# Patient Record
Sex: Female | Born: 1950
Health system: Southern US, Community
[De-identification: ages and names within clinical notes are randomized; demographics above are authoritative.]

## PROBLEM LIST (undated history)

## (undated) DIAGNOSIS — Z87442 Personal history of urinary calculi: Secondary | ICD-10-CM

## (undated) DIAGNOSIS — F32A Depression, unspecified: Secondary | ICD-10-CM

## (undated) DIAGNOSIS — R112 Nausea with vomiting, unspecified: Secondary | ICD-10-CM

## (undated) DIAGNOSIS — H9312 Tinnitus, left ear: Secondary | ICD-10-CM

## (undated) DIAGNOSIS — I10 Essential (primary) hypertension: Secondary | ICD-10-CM

## (undated) DIAGNOSIS — F419 Anxiety disorder, unspecified: Secondary | ICD-10-CM

## (undated) DIAGNOSIS — R011 Cardiac murmur, unspecified: Secondary | ICD-10-CM

## (undated) DIAGNOSIS — I509 Heart failure, unspecified: Secondary | ICD-10-CM

## (undated) DIAGNOSIS — E278 Other specified disorders of adrenal gland: Secondary | ICD-10-CM

## (undated) DIAGNOSIS — E119 Type 2 diabetes mellitus without complications: Secondary | ICD-10-CM

## (undated) DIAGNOSIS — K219 Gastro-esophageal reflux disease without esophagitis: Secondary | ICD-10-CM

## (undated) DIAGNOSIS — G709 Myoneural disorder, unspecified: Secondary | ICD-10-CM

## (undated) DIAGNOSIS — M1990 Unspecified osteoarthritis, unspecified site: Secondary | ICD-10-CM

## (undated) DIAGNOSIS — E785 Hyperlipidemia, unspecified: Secondary | ICD-10-CM

## (undated) DIAGNOSIS — J189 Pneumonia, unspecified organism: Secondary | ICD-10-CM

## (undated) DIAGNOSIS — R002 Palpitations: Secondary | ICD-10-CM

## (undated) DIAGNOSIS — F4310 Post-traumatic stress disorder, unspecified: Secondary | ICD-10-CM

## (undated) DIAGNOSIS — Z9889 Other specified postprocedural states: Secondary | ICD-10-CM

## (undated) HISTORY — PX: TONSILLECTOMY: SUR1361

## (undated) HISTORY — PX: NOSE SURGERY: SHX723

## (undated) HISTORY — PX: FRACTURE SURGERY: SHX138

## (undated) HISTORY — PX: RECTOCELE REPAIR: SHX761

## (undated) HISTORY — PX: BACK SURGERY: SHX140

## (undated) HISTORY — PX: VEIN SURGERY: SHX48

## (undated) HISTORY — PX: BLADDER SUSPENSION: SHX72

## (undated) HISTORY — PX: OTHER SURGICAL HISTORY: SHX169

## (undated) HISTORY — PX: ACHILLES TENDON SURGERY: SHX542

## (undated) HISTORY — PX: CHOLECYSTECTOMY: SHX55

## (undated) HISTORY — PX: ABDOMINAL HYSTERECTOMY: SHX81

## (undated) HISTORY — PX: APPENDECTOMY: SHX54

## (undated) HISTORY — PX: PARTIAL HYSTERECTOMY: SHX80

## (undated) HISTORY — DX: Heart failure, unspecified: I50.9

## (undated) HISTORY — DX: Unspecified osteoarthritis, unspecified site: M19.90

## (undated) HISTORY — PX: CARDIAC CATHETERIZATION: SHX172

## (undated) HISTORY — PX: VASCULAR SURGERY: SHX849

---

## 1999-07-24 ENCOUNTER — Other Ambulatory Visit: Admission: RE | Admit: 1999-07-24 | Discharge: 1999-07-24 | Payer: Self-pay | Admitting: Obstetrics and Gynecology

## 2000-09-18 ENCOUNTER — Observation Stay (HOSPITAL_COMMUNITY): Admission: EM | Admit: 2000-09-18 | Discharge: 2000-09-19 | Payer: Self-pay | Admitting: Cardiology

## 2000-09-18 ENCOUNTER — Encounter: Payer: Self-pay | Admitting: Cardiology

## 2001-04-07 HISTORY — PX: BREAST SURGERY: SHX581

## 2001-04-14 ENCOUNTER — Other Ambulatory Visit: Admission: RE | Admit: 2001-04-14 | Discharge: 2001-04-14 | Payer: Self-pay | Admitting: Obstetrics and Gynecology

## 2001-06-21 ENCOUNTER — Inpatient Hospital Stay (HOSPITAL_COMMUNITY): Admission: RE | Admit: 2001-06-21 | Discharge: 2001-06-24 | Payer: Self-pay | Admitting: Urology

## 2001-07-04 ENCOUNTER — Emergency Department (HOSPITAL_COMMUNITY): Admission: EM | Admit: 2001-07-04 | Discharge: 2001-07-04 | Payer: Self-pay | Admitting: Emergency Medicine

## 2001-07-06 ENCOUNTER — Emergency Department (HOSPITAL_COMMUNITY): Admission: EM | Admit: 2001-07-06 | Discharge: 2001-07-06 | Payer: Self-pay | Admitting: *Deleted

## 2002-01-21 ENCOUNTER — Encounter: Payer: Self-pay | Admitting: Emergency Medicine

## 2002-01-22 ENCOUNTER — Observation Stay (HOSPITAL_COMMUNITY): Admission: EM | Admit: 2002-01-22 | Discharge: 2002-01-23 | Payer: Self-pay | Admitting: Emergency Medicine

## 2002-10-05 ENCOUNTER — Other Ambulatory Visit: Admission: RE | Admit: 2002-10-05 | Discharge: 2002-10-05 | Payer: Self-pay | Admitting: Obstetrics and Gynecology

## 2002-10-06 ENCOUNTER — Encounter: Admission: RE | Admit: 2002-10-06 | Discharge: 2002-10-06 | Payer: Self-pay | Admitting: Obstetrics and Gynecology

## 2002-10-06 ENCOUNTER — Encounter: Payer: Self-pay | Admitting: Obstetrics and Gynecology

## 2003-02-28 ENCOUNTER — Encounter: Admission: RE | Admit: 2003-02-28 | Discharge: 2003-02-28 | Payer: Self-pay | Admitting: Pulmonary Disease

## 2003-08-19 ENCOUNTER — Inpatient Hospital Stay (HOSPITAL_COMMUNITY): Admission: EM | Admit: 2003-08-19 | Discharge: 2003-08-26 | Payer: Self-pay | Admitting: Emergency Medicine

## 2003-09-06 ENCOUNTER — Ambulatory Visit (HOSPITAL_COMMUNITY): Admission: RE | Admit: 2003-09-06 | Discharge: 2003-09-06 | Payer: Self-pay | Admitting: Family Medicine

## 2003-11-21 ENCOUNTER — Encounter: Admission: RE | Admit: 2003-11-21 | Discharge: 2003-11-21 | Payer: Self-pay | Admitting: Family Medicine

## 2004-03-08 ENCOUNTER — Ambulatory Visit: Payer: Self-pay | Admitting: Family Medicine

## 2005-04-15 ENCOUNTER — Ambulatory Visit: Payer: Self-pay | Admitting: Family Medicine

## 2005-04-22 ENCOUNTER — Ambulatory Visit: Payer: Self-pay | Admitting: Family Medicine

## 2005-04-22 ENCOUNTER — Encounter: Admission: RE | Admit: 2005-04-22 | Discharge: 2005-04-22 | Payer: Self-pay | Admitting: Family Medicine

## 2005-04-28 ENCOUNTER — Encounter: Admission: RE | Admit: 2005-04-28 | Discharge: 2005-07-27 | Payer: Self-pay | Admitting: Family Medicine

## 2005-04-28 ENCOUNTER — Encounter: Admission: RE | Admit: 2005-04-28 | Discharge: 2005-04-28 | Payer: Self-pay | Admitting: Family Medicine

## 2005-05-13 ENCOUNTER — Ambulatory Visit (HOSPITAL_BASED_OUTPATIENT_CLINIC_OR_DEPARTMENT_OTHER): Admission: RE | Admit: 2005-05-13 | Discharge: 2005-05-13 | Payer: Self-pay | Admitting: Surgery

## 2005-05-14 ENCOUNTER — Encounter (INDEPENDENT_AMBULATORY_CARE_PROVIDER_SITE_OTHER): Payer: Self-pay | Admitting: *Deleted

## 2005-07-16 ENCOUNTER — Ambulatory Visit: Payer: Self-pay | Admitting: Family Medicine

## 2005-07-30 ENCOUNTER — Ambulatory Visit: Payer: Self-pay | Admitting: Family Medicine

## 2005-08-26 ENCOUNTER — Ambulatory Visit: Payer: Self-pay | Admitting: Family Medicine

## 2005-11-27 ENCOUNTER — Emergency Department (HOSPITAL_COMMUNITY): Admission: EM | Admit: 2005-11-27 | Discharge: 2005-11-27 | Payer: Self-pay | Admitting: Emergency Medicine

## 2005-12-11 ENCOUNTER — Emergency Department (HOSPITAL_COMMUNITY): Admission: EM | Admit: 2005-12-11 | Discharge: 2005-12-11 | Payer: Self-pay | Admitting: Family Medicine

## 2006-02-10 ENCOUNTER — Emergency Department (HOSPITAL_COMMUNITY): Admission: EM | Admit: 2006-02-10 | Discharge: 2006-02-11 | Payer: Self-pay | Admitting: Emergency Medicine

## 2006-02-16 ENCOUNTER — Ambulatory Visit: Payer: Self-pay | Admitting: Family Medicine

## 2006-04-29 ENCOUNTER — Ambulatory Visit: Payer: Self-pay | Admitting: Internal Medicine

## 2006-04-29 ENCOUNTER — Emergency Department (HOSPITAL_COMMUNITY): Admission: EM | Admit: 2006-04-29 | Discharge: 2006-04-29 | Payer: Self-pay | Admitting: Emergency Medicine

## 2006-05-02 ENCOUNTER — Emergency Department (HOSPITAL_COMMUNITY): Admission: EM | Admit: 2006-05-02 | Discharge: 2006-05-02 | Payer: Self-pay | Admitting: Family Medicine

## 2006-06-28 ENCOUNTER — Emergency Department (HOSPITAL_COMMUNITY): Admission: EM | Admit: 2006-06-28 | Discharge: 2006-06-28 | Payer: Self-pay | Admitting: Emergency Medicine

## 2006-12-06 ENCOUNTER — Emergency Department (HOSPITAL_COMMUNITY): Admission: EM | Admit: 2006-12-06 | Discharge: 2006-12-06 | Payer: Self-pay | Admitting: Emergency Medicine

## 2007-02-20 ENCOUNTER — Emergency Department (HOSPITAL_COMMUNITY): Admission: EM | Admit: 2007-02-20 | Discharge: 2007-02-20 | Payer: Self-pay | Admitting: Emergency Medicine

## 2007-03-06 ENCOUNTER — Emergency Department (HOSPITAL_COMMUNITY): Admission: EM | Admit: 2007-03-06 | Discharge: 2007-03-06 | Payer: Self-pay | Admitting: Emergency Medicine

## 2007-03-10 ENCOUNTER — Telehealth: Payer: Self-pay | Admitting: Family Medicine

## 2007-05-06 ENCOUNTER — Emergency Department (HOSPITAL_COMMUNITY): Admission: EM | Admit: 2007-05-06 | Discharge: 2007-05-06 | Payer: Self-pay | Admitting: Emergency Medicine

## 2007-07-03 ENCOUNTER — Emergency Department (HOSPITAL_COMMUNITY): Admission: EM | Admit: 2007-07-03 | Discharge: 2007-07-03 | Payer: Self-pay | Admitting: Family Medicine

## 2007-08-18 ENCOUNTER — Emergency Department (HOSPITAL_COMMUNITY): Admission: EM | Admit: 2007-08-18 | Discharge: 2007-08-18 | Payer: Self-pay | Admitting: Emergency Medicine

## 2007-09-27 ENCOUNTER — Inpatient Hospital Stay (HOSPITAL_COMMUNITY): Admission: AD | Admit: 2007-09-27 | Discharge: 2007-09-27 | Payer: Self-pay | Admitting: Obstetrics & Gynecology

## 2007-10-11 ENCOUNTER — Emergency Department (HOSPITAL_COMMUNITY): Admission: EM | Admit: 2007-10-11 | Discharge: 2007-10-11 | Payer: Self-pay | Admitting: Emergency Medicine

## 2007-11-11 ENCOUNTER — Ambulatory Visit: Payer: Self-pay | Admitting: Obstetrics and Gynecology

## 2007-11-11 ENCOUNTER — Encounter: Payer: Self-pay | Admitting: Physician Assistant

## 2007-11-11 ENCOUNTER — Ambulatory Visit (HOSPITAL_COMMUNITY): Admission: RE | Admit: 2007-11-11 | Discharge: 2007-11-11 | Payer: Self-pay | Admitting: Gynecology

## 2007-11-24 ENCOUNTER — Ambulatory Visit: Payer: Self-pay | Admitting: Obstetrics and Gynecology

## 2007-11-30 ENCOUNTER — Inpatient Hospital Stay (HOSPITAL_COMMUNITY): Admission: AD | Admit: 2007-11-30 | Discharge: 2007-11-30 | Payer: Self-pay | Admitting: Gynecology

## 2007-12-09 ENCOUNTER — Ambulatory Visit: Payer: Self-pay | Admitting: Gynecology

## 2007-12-14 ENCOUNTER — Emergency Department (HOSPITAL_COMMUNITY): Admission: EM | Admit: 2007-12-14 | Discharge: 2007-12-14 | Payer: Self-pay | Admitting: Emergency Medicine

## 2007-12-30 ENCOUNTER — Ambulatory Visit: Payer: Self-pay | Admitting: Obstetrics & Gynecology

## 2008-01-13 ENCOUNTER — Ambulatory Visit: Payer: Self-pay | Admitting: Obstetrics & Gynecology

## 2008-01-17 ENCOUNTER — Ambulatory Visit (HOSPITAL_COMMUNITY): Admission: RE | Admit: 2008-01-17 | Discharge: 2008-01-17 | Payer: Self-pay | Admitting: Obstetrics & Gynecology

## 2008-01-20 ENCOUNTER — Ambulatory Visit: Payer: Self-pay | Admitting: Obstetrics & Gynecology

## 2008-01-20 ENCOUNTER — Inpatient Hospital Stay (HOSPITAL_COMMUNITY): Admission: AD | Admit: 2008-01-20 | Discharge: 2008-01-20 | Payer: Self-pay | Admitting: Obstetrics & Gynecology

## 2008-03-20 ENCOUNTER — Observation Stay (HOSPITAL_COMMUNITY): Admission: EM | Admit: 2008-03-20 | Discharge: 2008-03-23 | Payer: Self-pay | Admitting: Emergency Medicine

## 2008-03-21 ENCOUNTER — Ambulatory Visit: Payer: Self-pay | Admitting: Gastroenterology

## 2008-03-22 ENCOUNTER — Encounter: Payer: Self-pay | Admitting: Gastroenterology

## 2008-03-23 ENCOUNTER — Encounter (INDEPENDENT_AMBULATORY_CARE_PROVIDER_SITE_OTHER): Payer: Self-pay | Admitting: *Deleted

## 2008-05-03 ENCOUNTER — Telehealth: Payer: Self-pay | Admitting: Gastroenterology

## 2008-05-03 ENCOUNTER — Ambulatory Visit: Payer: Self-pay | Admitting: Family Medicine

## 2008-05-04 ENCOUNTER — Ambulatory Visit: Payer: Self-pay | Admitting: *Deleted

## 2008-05-04 ENCOUNTER — Encounter: Payer: Self-pay | Admitting: Nurse Practitioner

## 2008-05-04 ENCOUNTER — Ambulatory Visit: Payer: Self-pay | Admitting: Gastroenterology

## 2008-05-04 ENCOUNTER — Ambulatory Visit: Payer: Self-pay | Admitting: Internal Medicine

## 2008-05-04 DIAGNOSIS — I10 Essential (primary) hypertension: Secondary | ICD-10-CM | POA: Insufficient documentation

## 2008-05-04 DIAGNOSIS — E7800 Pure hypercholesterolemia, unspecified: Secondary | ICD-10-CM | POA: Insufficient documentation

## 2008-05-04 DIAGNOSIS — K219 Gastro-esophageal reflux disease without esophagitis: Secondary | ICD-10-CM | POA: Insufficient documentation

## 2008-05-04 DIAGNOSIS — E1149 Type 2 diabetes mellitus with other diabetic neurological complication: Secondary | ICD-10-CM | POA: Insufficient documentation

## 2008-05-04 DIAGNOSIS — E876 Hypokalemia: Secondary | ICD-10-CM | POA: Insufficient documentation

## 2008-05-04 DIAGNOSIS — K920 Hematemesis: Secondary | ICD-10-CM | POA: Insufficient documentation

## 2008-05-04 DIAGNOSIS — K5900 Constipation, unspecified: Secondary | ICD-10-CM | POA: Insufficient documentation

## 2008-05-04 LAB — CONVERTED CEMR LAB
Basophils Absolute: 0 10*3/uL (ref 0.0–0.1)
Basophils Relative: 0.4 % (ref 0.0–3.0)
Eosinophils Absolute: 0.1 10*3/uL (ref 0.0–0.7)
Eosinophils Relative: 2 % (ref 0.0–5.0)
HCT: 39.4 % (ref 36.0–46.0)
Hemoglobin: 13.9 g/dL (ref 12.0–15.0)
Lymphocytes Relative: 38.8 % (ref 12.0–46.0)
MCHC: 35.2 g/dL (ref 30.0–36.0)
MCV: 95.4 fL (ref 78.0–100.0)
Monocytes Absolute: 0.4 10*3/uL (ref 0.1–1.0)
Monocytes Relative: 9.1 % (ref 3.0–12.0)
Neutro Abs: 2.4 10*3/uL (ref 1.4–7.7)
Neutrophils Relative %: 49.7 % (ref 43.0–77.0)
Platelets: 228 10*3/uL (ref 150–400)
RBC: 4.13 M/uL (ref 3.87–5.11)
RDW: 11.7 % (ref 11.5–14.6)
WBC: 4.7 10*3/uL (ref 4.5–10.5)

## 2008-05-08 ENCOUNTER — Telehealth: Payer: Self-pay | Admitting: Internal Medicine

## 2008-05-08 ENCOUNTER — Emergency Department (HOSPITAL_COMMUNITY): Admission: EM | Admit: 2008-05-08 | Discharge: 2008-05-09 | Payer: Self-pay | Admitting: Emergency Medicine

## 2008-05-11 ENCOUNTER — Ambulatory Visit: Payer: Self-pay | Admitting: Gastroenterology

## 2008-05-12 ENCOUNTER — Telehealth: Payer: Self-pay | Admitting: Gastroenterology

## 2008-05-18 ENCOUNTER — Ambulatory Visit (HOSPITAL_COMMUNITY): Admission: RE | Admit: 2008-05-18 | Discharge: 2008-05-18 | Payer: Self-pay | Admitting: Gastroenterology

## 2008-05-30 ENCOUNTER — Ambulatory Visit: Payer: Self-pay | Admitting: Gastroenterology

## 2008-05-30 DIAGNOSIS — K3184 Gastroparesis: Secondary | ICD-10-CM | POA: Insufficient documentation

## 2008-06-05 ENCOUNTER — Emergency Department (HOSPITAL_COMMUNITY): Admission: EM | Admit: 2008-06-05 | Discharge: 2008-06-05 | Payer: Self-pay | Admitting: Emergency Medicine

## 2008-06-23 ENCOUNTER — Telehealth: Payer: Self-pay | Admitting: Gastroenterology

## 2008-07-10 ENCOUNTER — Emergency Department (HOSPITAL_COMMUNITY): Admission: EM | Admit: 2008-07-10 | Discharge: 2008-07-10 | Payer: Self-pay | Admitting: Emergency Medicine

## 2008-07-10 ENCOUNTER — Telehealth: Payer: Self-pay | Admitting: Gastroenterology

## 2008-07-11 ENCOUNTER — Emergency Department (HOSPITAL_COMMUNITY): Admission: EM | Admit: 2008-07-11 | Discharge: 2008-07-11 | Payer: Self-pay | Admitting: Emergency Medicine

## 2008-07-11 ENCOUNTER — Ambulatory Visit: Payer: Self-pay | Admitting: Gastroenterology

## 2008-07-11 ENCOUNTER — Encounter (INDEPENDENT_AMBULATORY_CARE_PROVIDER_SITE_OTHER): Payer: Self-pay | Admitting: *Deleted

## 2008-07-17 ENCOUNTER — Encounter (INDEPENDENT_AMBULATORY_CARE_PROVIDER_SITE_OTHER): Payer: Self-pay | Admitting: *Deleted

## 2008-08-01 ENCOUNTER — Ambulatory Visit: Payer: Self-pay | Admitting: Internal Medicine

## 2008-08-08 ENCOUNTER — Ambulatory Visit: Payer: Self-pay | Admitting: Internal Medicine

## 2008-08-22 ENCOUNTER — Ambulatory Visit: Payer: Self-pay | Admitting: Internal Medicine

## 2008-08-28 ENCOUNTER — Emergency Department (HOSPITAL_COMMUNITY): Admission: EM | Admit: 2008-08-28 | Discharge: 2008-08-28 | Payer: Self-pay | Admitting: Emergency Medicine

## 2008-09-07 ENCOUNTER — Ambulatory Visit (HOSPITAL_COMMUNITY): Admission: RE | Admit: 2008-09-07 | Discharge: 2008-09-07 | Payer: Self-pay | Admitting: Internal Medicine

## 2008-10-18 ENCOUNTER — Telehealth: Payer: Self-pay | Admitting: Gastroenterology

## 2008-10-18 ENCOUNTER — Ambulatory Visit: Payer: Self-pay | Admitting: Gastroenterology

## 2008-10-18 ENCOUNTER — Encounter: Payer: Self-pay | Admitting: Gastroenterology

## 2008-10-18 DIAGNOSIS — K921 Melena: Secondary | ICD-10-CM | POA: Insufficient documentation

## 2008-10-18 LAB — CONVERTED CEMR LAB
Basophils Absolute: 0.1 10*3/uL (ref 0.0–0.1)
Basophils Relative: 3.2 % — ABNORMAL HIGH (ref 0.0–3.0)
Eosinophils Absolute: 0.1 10*3/uL (ref 0.0–0.7)
Eosinophils Relative: 1.6 % (ref 0.0–5.0)
HCT: 39.2 % (ref 36.0–46.0)
Hemoglobin: 13.6 g/dL (ref 12.0–15.0)
Lymphocytes Relative: 38.8 % (ref 12.0–46.0)
Lymphs Abs: 1.7 10*3/uL (ref 0.7–4.0)
MCHC: 34.7 g/dL (ref 30.0–36.0)
MCV: 95.3 fL (ref 78.0–100.0)
Monocytes Absolute: 0.5 10*3/uL (ref 0.1–1.0)
Monocytes Relative: 11.9 % (ref 3.0–12.0)
Neutro Abs: 2.1 10*3/uL (ref 1.4–7.7)
Neutrophils Relative %: 44.5 % (ref 43.0–77.0)
Platelets: 224 10*3/uL (ref 150.0–400.0)
RBC: 4.12 M/uL (ref 3.87–5.11)
RDW: 12.1 % (ref 11.5–14.6)
T4, Total: 6.8 ug/dL (ref 5.0–12.5)
TSH: 0.98 microintl units/mL (ref 0.35–5.50)
WBC: 4.5 10*3/uL (ref 4.5–10.5)

## 2008-10-24 ENCOUNTER — Ambulatory Visit: Payer: Self-pay | Admitting: Gastroenterology

## 2008-10-24 LAB — CONVERTED CEMR LAB: Fecal Occult Bld: NEGATIVE

## 2008-11-23 ENCOUNTER — Telehealth: Payer: Self-pay | Admitting: Gastroenterology

## 2008-12-04 ENCOUNTER — Ambulatory Visit: Payer: Self-pay | Admitting: Family Medicine

## 2008-12-15 ENCOUNTER — Ambulatory Visit: Payer: Self-pay | Admitting: Internal Medicine

## 2008-12-15 ENCOUNTER — Encounter (INDEPENDENT_AMBULATORY_CARE_PROVIDER_SITE_OTHER): Payer: Self-pay | Admitting: Adult Health

## 2008-12-20 ENCOUNTER — Encounter: Admission: RE | Admit: 2008-12-20 | Discharge: 2008-12-20 | Payer: Self-pay | Admitting: Internal Medicine

## 2009-01-04 ENCOUNTER — Telehealth: Payer: Self-pay | Admitting: Gastroenterology

## 2009-03-23 ENCOUNTER — Ambulatory Visit: Payer: Self-pay | Admitting: Internal Medicine

## 2009-05-31 ENCOUNTER — Telehealth: Payer: Self-pay | Admitting: Gastroenterology

## 2009-07-24 ENCOUNTER — Telehealth (INDEPENDENT_AMBULATORY_CARE_PROVIDER_SITE_OTHER): Payer: Self-pay | Admitting: *Deleted

## 2010-04-28 ENCOUNTER — Encounter: Payer: Self-pay | Admitting: Obstetrics and Gynecology

## 2010-04-28 ENCOUNTER — Encounter: Payer: Self-pay | Admitting: Family Medicine

## 2010-04-29 ENCOUNTER — Encounter: Payer: Self-pay | Admitting: Obstetrics and Gynecology

## 2010-05-09 NOTE — Progress Notes (Signed)
Summary: TRIAGE-VOMITING BLOOD/CONSTIPATION  Phone Note Call from Patient Call back at [phone]   Caller: Patient Call For: Dr. Arlyce Dice Reason for Call: Talk to Nurse Details for Reason: triage appt?/vomited  blood Summary of Call: pt was seen by Dr. Arlyce Dice at [facility] right before Christmas 2009 b/c she had not had a BM for two weeks at that point... pt had a COL then at hospital... pt has since become constipated again and last night pt says she vomited straight bright red blood... told pt next opening for Dr. Arlyce Dice was Feb 15th if we do not get her in sooner, nurse will let her know Initial call taken by: Vallarie Mare,  May 03, 2008 10:14 AM  Follow-up for Phone Call        Pt. c/o vomiting blood last night, and "A couple weeks ago, but it wasn't this much" Epigastric burning,pain "All the time" Doesn't take anything for GERD. Also c/o constipation-uses Colace,castor oil,Miralax daily and if needed she takes an enema and glycerine oil. May have a BM 1-2X weekly, if that.  1) Prilosec OTC two times a day 2) Soft,bland diet. No spicy,greasy,fried foods.  3) Increase Miralax to two times a day 4) If you start vomiting blood again go directly to the ER. 5) See Gunnar Fusi tomorrow at 2pm. Have labs drawn prior.   Follow-up by: Laureen Ochs LPN,  May 03, 2008 10:54 AM  Additional Follow-up for Phone Call Additional follow up Details #1::        ok Additional Follow-up by: Louis Meckel MD,  May 04, 2008 9:45 AM

## 2010-05-09 NOTE — Assessment & Plan Note (Signed)
  Nurse Visit    Preventive Screening-Counseling & Management  Comments: Subject takes Erythromycin Base -- exclusionary drug per study (Medical Monitor) and does not wish to come off.  Declined study participation.   Allergies: 1)  ! * Narcotics       ]

## 2010-05-09 NOTE — Assessment & Plan Note (Signed)
Summary: F/U ENDOSCOPY,GES AND STARTING REGLAN            DEBORAH   History of Present Illness Visit Type: follow up Primary GI MD: Melvia Heaps MD [facility] Primary Provider: n/a Chief Complaint: follow-up visit after EGD and GES, Reglan is helping, but it is making pt very jumpy and feels like she is having a panic attack.  Very confused about diagnosis and diet. History of Present Illness:   Mrs. [name] has returned on upper endoscopy and gastric infection scan.  The formal was normal.  The latter history of significant gastric delay.  On Reglan 10 mg one half hour a.c. and h.s. her complaints of bloating and nausea have significantly improved.  She does complain of the medicine making her feel very jumpy.   GI Review of Systems    Reports nausea and  vomiting.      Denies abdominal pain, acid reflux, belching, bloating, chest pain, dysphagia with liquids, dysphagia with solids, heartburn, loss of appetite, vomiting blood, weight loss, and  weight gain.        Denies anal fissure, black tarry stools, change in bowel habit, constipation, diarrhea, diverticulosis, fecal incontinence, heme positive stool, hemorrhoids, irritable bowel syndrome, jaundice, light color stool, liver problems, rectal bleeding, and  rectal pain.   Prior Medications Reviewed Using: Patient Recall  Updated Prior Medication List: CLONAZEPAM 2 MG TABS (CLONAZEPAM)  HYDROCHLOROTHIAZIDE 25 MG TABS (HYDROCHLOROTHIAZIDE) Take 1 tablet by mouth once a day TRAZODONE HCL 100 MG TABS (TRAZODONE HCL) at bedtime MIRALAX  POWD (POLYETHYLENE GLYCOL 3350) two times a day COLACE 100 MG CAPS (DOCUSATE SODIUM) two times a day METOCLOPRAMIDE HCL 10 MG TABS (METOCLOPRAMIDE HCL) Take one by mouth 30 minuted before breakfast,lunch,dinner and at bedtime. MILK OF MAGNESIA 400 MG/5ML SUSP (MAGNESIUM HYDROXIDE) 1/2 cap as needed  Current Allergies (reviewed today): ! * NARCOTICS Past Medical History:    Reviewed history from 05/04/2008  and no changes required:       Current Problems:        CONSTIPATION (ICD-564.00)       HYPERCHOLESTEROLEMIA (ICD-272.0)       ANXIETY DEPRESSION (ICD-300.4)       MORBID OBESITY (ICD-278.01)       GLUCOSE INTOLERANCE (ICD-271.3)       HYPOKALEMIA (ICD-276.8)       HYPERTENSION, HX OF (ICD-V12.50)         Past Surgical History:    Reviewed history from 05/04/2008 and no changes required:       Bladder surgery   Family History:    Reviewed history from 05/04/2008 and no changes required:       Family History of Esophageal Cancer:Brother       Lung Cancer : Father       Family History of Diabetes: Mother  Social History:    Reviewed history from 05/04/2008 and no changes required:       Occupation:        Patient has never smoked.        Alcohol Use - no       Daily Caffeine Use       Illicit Drug Use - no       Patient does not get regular exercise.   Review of Systems       The patient complains of sleeping problems.  The patient denies allergy/sinus, anemia, anxiety-new, arthritis/joint pain, back pain, blood in urine, breast changes/lumps, change in vision, confusion, cough, coughing up blood, depression-new, fainting,  fatigue, fever, headaches-new, hearing problems, heart murmur, heart rhythm changes, itching, menstrual pain, muscle pains/cramps, night sweats, nosebleeds, pregnancy symptoms, shortness of breath, skin rash, sore throat, swelling of feet/legs, swollen lymph glands, thirst - excessive , urination - excessive , urination changes/pain, urine leakage, vision changes, and voice change.    Vital Signs:  Patient Profile:   60 Years Old Female Height:     68 inches Weight:      178 pounds BMI:     27.16 Pulse rate:   84 / minute Pulse rhythm:   regular BP sitting:   122 / 82  (left arm) Cuff size:   regular  Vitals Entered By: Francee Piccolo CMA (May 30, 2008 3:36 PM)                   Impression & Recommendations:  Problem # 1:   GASTROPARESIS (ICD-536.3) Assessment: Improved Gastroparesis explains her many of her GI complaints.  Recommendations #1 patient will consider enrollment in a gastroparesis study. It is noteworthy that she did become jumpy with the oral preparation though she has not had any symptoms suggestive of tardive dyskinesia.

## 2010-05-09 NOTE — Progress Notes (Signed)
Summary: rx refill  Phone Note Call from Patient Call back at Home Phone [phone]   Caller: Patient Call For: Arlyce Dice Reason for Call: Refill Medication Summary of Call:  Pt needs written rx for Donperidone mailed to [address] [place] [address] (temporary address) Initial call taken by: Tawni Levy,  May 31, 2009 11:09 AM  Follow-up for Phone Call        Called and left message for pt that I would mail her the script to address provided today Follow-up by: Merri Ray CMA Duncan Dull),  May 31, 2009 1:52 PM    Prescriptions: DOMPERIDONE 10MG  take 1 tablet by mouth before meals and at bedtime  #120 x 6   Entered by:   Merri Ray CMA (AAMA)   Authorized by:   Louis Meckel MD   Signed by:   Merri Ray CMA (AAMA) on 05/31/2009   Method used:   Print then Give to Patient   RxID:   [id number]

## 2010-05-09 NOTE — Assessment & Plan Note (Signed)
Summary: GI Research patient status.  Nurse Visit    Preventive Screening-Counseling & Management  Comments: 07/11/2008 Patient was given a consent form for review for the Gastroparesis study on 05/30/08. Patient was interested in participating, but had a few personal matters that needed her attention before she could proceed with the study. Okey Regal spoke to the patient again today, and subject is scheduled to begin screening process on 07/18/08 @10 :00 am.    Allergies: 1)  ! * Narcotics       ]

## 2010-05-09 NOTE — Progress Notes (Signed)
Summary: Calling from ED  Phone Note Call from Patient   Summary of Call: Called from [facility] ED while waiting to be seen re: constipation and abd pain told to have ED staff evaluate her and we are available as needed  Initial call taken by: Iva Boop MD,  May 08, 2008 9:51 PM

## 2010-05-09 NOTE — Progress Notes (Signed)
Summary: ? test  Phone Note Call from Patient Call back at Home Phone [phone]   Caller: Patient Call For: [facility] Reason for Call: Talk to Nurse Summary of Call: had EGD yesterday. pt has ? re: gastric emptying scan. would like to know if Dr saw something in EGD that would lead him to sch this test. Initial call taken by: Guadlupe Spanish [facility],  May 12, 2008 11:01 AM    Called pt. explained that she was diabetic and sometimes the gastic system can slow down.  This test would rule out or prove gastric paresis.  Pt. appreciated explanation.

## 2010-05-09 NOTE — Progress Notes (Signed)
Summary: refill  Phone Note Call from Patient Call back at Home Phone [phone]   Caller: Patient Call For: Dr. Arlyce Dice Reason for Call: Refill Medication Summary of Call: pt needs written rx for Donperidone Initial call taken by: Vallarie Mare,  January 04, 2009 11:03 AM  Follow-up for Phone Call        Filled out rx and Dr Arlyce Dice signed. Printed and pt to pick up today. Follow-up by: Merri Ray CMA (AAMA),  January 04, 2009 11:24 AM    Prescriptions: DOMPERIDONE 10MG  take 1 tablet by mouth before meals and at bedtime  #120 x 2   Entered by:   Merri Ray CMA (AAMA)   Authorized by:   Louis Meckel MD   Signed by:   Merri Ray CMA (AAMA) on 01/04/2009   Method used:   Print then Give to Patient   RxID:   [id number]

## 2010-05-09 NOTE — Progress Notes (Signed)
Summary: TRIAGE  Phone Note Call from Patient Call back at Home Phone [phone]   Call For: Dr Arlyce Dice Reason for Call: Talk to Nurse Summary of Call: Having a real hard time with Gastroparesis. would like to be seen next week please. Initial call taken by: Leanor Kail [facility],  June 23, 2008 12:06 PM  Follow-up for Phone Call        Taking Reglan 10mg  AC and HS. Pt. states she can't sleep at night and her legs shake all day.   Pt. continues with bloating, her clothes are tight from the bloating. "I look 4 1/2 monthes pregnant, for no more than I've eaten today" Pt. goes to bed without eating dinner because of the severe bloating.    Pt. also has to strain to have a BM, has a BM 2-3X weekly, she does take Miralax daily.  DR.Rajah Tagliaferro PLEASE ADVISE Follow-up by: Laureen Ochs LPN,  June 23, 2008 1:21 PM  Additional Follow-up for Phone Call Additional follow up Details #1::        d/c reglan. begin erythromycin 250mg  qid ov 2 weeks Additional Follow-up by: Louis Meckel MD,  June 23, 2008 4:02 PM      Appended Document: Med. Update Above MD orders reviewed w/pt. Med. to pharmacy . REV scheduled for 07-11-08 at 10:30am. Pt. instructed to call back as needed.    Clinical Lists Changes  Medications: Changed medication from METOCLOPRAMIDE HCL 10 MG TABS (METOCLOPRAMIDE HCL) Take one by mouth 30 minuted before breakfast,lunch,dinner and at bedtime. to ERYTHROMYCIN BASE 250 MG TABS (ERYTHROMYCIN BASE) Take one tab 30 minutes before breakfast,lunch,dinner and at bedtime. - Signed Rx of ERYTHROMYCIN BASE 250 MG TABS (ERYTHROMYCIN BASE) Take one tab 30 minutes before breakfast,lunch,dinner and at bedtime.;  #120 x 6;  Signed;  Entered by: Laureen Ochs LPN;  Authorized by: Louis Meckel MD;  Method used: Electronically to [facility] [address], [place], [place], [place]  [address], Ph: [phone] or [phone], Fax:  [phone]    Prescriptions: ERYTHROMYCIN BASE 250 MG TABS (ERYTHROMYCIN BASE) Take one tab 30 minutes before breakfast,lunch,dinner and at bedtime.  #120 x 6   Entered by:   Laureen Ochs LPN   Authorized by:   Louis Meckel MD   Signed by:   Laureen Ochs LPN on 24/40/1027   Method used:   Electronically to        [facility] (retail)       [address]       [place], [place]  [address]       Ph: [phone] or [phone]       Fax: [phone]   RxID:   [id number]

## 2010-05-09 NOTE — Letter (Signed)
Summary: [facility]: Immunoassay Fecal Occult Blood (iFOB) Order Form  [facility]  [address] [place], [place] [address]   Phone: [phone]  Fax: [phone]      [name]: Immunoassay Fecal Occult Blood (iFOB) Order Form   October 18, 2008 MRN: [id number]   [name] [birth year]   Physicican Name:_Robert Kaplan___  Diagnosis Code:__blood in stool 578.1,536.3__      Merri Ray CMA (AAMA)

## 2010-05-09 NOTE — Progress Notes (Signed)
Summary: refill  Medications Added CLONAZEPAM 2 MG TABS (CLONAZEPAM)  HYDROCHLOROTHIAZIDE 25 MG TABS (HYDROCHLOROTHIAZIDE)  HYDRO-TUSSIN DHC 15-2-7.5 MG/5ML SYRP (PSEUDOEPH-CPM-DIHYDROCODEINE) Take 2 teaspoon by mouth at bedtime      Allergies Added: NKDA Phone Note From Pharmacy   Caller: [facility] Summary of Call: Clonazepam 2mg  1 by mouth two times a day Initial call taken by: Alfred Levins, CMA,  March 10, 2007 4:32 PM  Follow-up for Phone Call        call in #60 with 5 rf Follow-up by: Nelwyn Salisbury MD,  March 11, 2007 8:47 AM  Additional Follow-up for Phone Call Additional follow up Details #1::        Phone call completed, Pharmacist called Additional Follow-up by: Alfred Levins, CMA,  March 11, 2007 9:00 AM    New/Updated Medications: CLONAZEPAM 2 MG TABS (CLONAZEPAM)  HYDROCHLOROTHIAZIDE 25 MG TABS (HYDROCHLOROTHIAZIDE)  HYDRO-TUSSIN DHC 15-2-7.5 MG/5ML SYRP (PSEUDOEPH-CPM-DIHYDROCODEINE) Take 2 teaspoon by mouth at bedtime   Prescriptions: CLONAZEPAM 2 MG TABS (CLONAZEPAM)   #60 x 5   Entered by:   Alfred Levins, CMA   Authorized by:   Nelwyn Salisbury MD   Signed by:   Alfred Levins, CMA on 03/11/2007   Method used:   Telephoned to ...         RxID:   [id number]

## 2010-05-09 NOTE — Discharge Summary (Signed)
Summary: DISCHARGE SUMMARY  NAME:  [name], [name] NO.:  [id number]      MEDICAL RECORD NO.:  [id number]          PATIENT TYPE:  OBV      LOCATION:  5039                         FACILITY:  [facility]      PHYSICIAN:  Herbie Saxon, MDDATE OF BIRTH:  Nov 25, 1950      DATE OF ADMISSION:  03/20/2008   DATE OF DISCHARGE:  03/23/2008                                  DISCHARGE SUMMARY      DISCHARGE DIAGNOSES:   1. Lower gastrointestinal secondary to history of hemorrhoids.   2. Chronic severe constipation, improved on MiraLax.   3. Escherichia coli urinary tract infection, improved on Bactrim.   4. Hypertension with borderline hypotension, improving on IV fluid       hydration.   5. Diabetes, stable, diet controlled.   6. Psych disorder.   7. History of anxiety, depression.   8. History of hyperlipidemia.      CONSULTS:  Barbette Hair. Arlyce Dice, MD, [facility], Gastroenterology.      PROCEDURES:  Colonoscopy performed on this admission on March 22, 2008, was normal.      RADIOLOGY:  The abdominal x-ray of March 20, 2008, shows moderate   stool blood and no acute findings.      HOSPITAL COURSE:  This 60 year old lady presented to the emergency room   with severe constipation, bloating, anorexia, nausea, vomiting after   eating.  Noted to have positive hemoccult.  Gastroenterology was   subsequently consulted on the colonoscopy that was done was normal.   They are recommending to continue with MiraLax daily and Anusol   Suppository as needed.  They believe that GI bleed was probably   secondary to hemorrhoids.  Her constipation is improved with the   laxatives and stool softeners and anemia is mild.  She was noticed to   having borderline hypotension and diuretic HCTZ medications is being put   on hold for now.  This could be restarted as outpatient once reviewed by   the primary care physician at [facility].      DISCHARGE CONDITION:  Stable.      DIET:  1800  calorie ADA, heart healthy.      ACTIVITY:  No restrictions.      FOLLOWUP:  With the primary care physician in [facility] in the next 5-   7 days.  Followup with Dr. Arlyce Dice, Gastroenterology, as needed in the   next 6-8 weeks at the primary care physician's discretion.      MEDICATIONS ON DISCHARGE:   1. Septra 1 twice daily for the next 3 days.   2. Colace 100 mg twice daily.   3. MiraLax 17 g daily as needed.   4. Anusol Suppository 1 nightly as needed.      PHYSICAL EXAMINATION:  GENERAL:  She is a middle-aged lady not in acute   distress, mildly pale, not jaundiced.   VITAL SIGNS:  Temperature is 98, pulse 58, respiratory rate is 16, blood   pressure 192/62.   HEAD:  Atraumatic, normocephalic.  Mucous membranes are  moist.   NECK:  Supple.   CHEST:  Clinically clear.   HEART:  S1, S2.  Regular rate and rhythm.  No murmur, gallops, or rubs.   ABDOMEN:  Soft, nontender.  Bowel sounds present.   NEUROLOGIC:  She is alert and oriented to time, place, and person.  No   neurological deficits.  Peripheral pulses present.  No pedal edema.      LABORATORY DATA:  Hematocrit is 35.  Urine culture E. coli, sensitive to   ampicillin, Rocephin, Cipro, Septra.  Chemistry, sodium is 136,   potassium 3.7, chloride 101, bicarbonate 26, glucose 82, BUN 10,   creatinine 0.9, hemoglobin A1c is 5.6.      The patient's illness, medications, past treatment and discharge plan   explained to her.  She verbalizes understanding.  Discharge greater than   30 minutes.               Herbie Saxon, MD   Electronically Signed            MIO/MEDQ  D:  03/23/2008  T:  03/24/2008  Job:  [id number]      cc:   Barbette Hair. Arlyce Dice, MD,FACG   [facility]

## 2010-05-09 NOTE — Progress Notes (Signed)
Summary: Records request from [facility]  Request for records received from [facility]. Request forwarded to [facility]. Dena Chavis  July 24, 2009 10:11 AM

## 2010-05-09 NOTE — Assessment & Plan Note (Signed)
Summary: 3-4 DAYS OF BLACK,TARRY,FOUL SMELLING STOOLS AND EPIGASTRIC P...   History of Present Illness Visit Type: Follow-up Visit Primary GI MD: Melvia Heaps MD [facility] Primary Provider: [facility] Chief Complaint: Bloating, "stomach feels bad" and black tarry stools, pt is having regular BM's [name] has returned for evaluation of tarry stools.  Over the last 6 days she claims to be passing black, tarry stools that are foul-smelling.  She is having up to 3 day.  She denies weakness.  Patient has a history of gastroparesis for which she is currently taking erythromycin.  She continues to complain of bloating and chronic nausea.  Hemoglobin today is 13.6.   GI Review of Systems    Reports bloating.      Denies abdominal pain, acid reflux, belching, chest pain, dysphagia with liquids, dysphagia with solids, heartburn, loss of appetite, nausea, vomiting, vomiting blood, weight loss, and  weight gain.      Reports black tarry stools.     Denies anal fissure, change in bowel habit, constipation, diarrhea, diverticulosis, fecal incontinence, heme positive stool, hemorrhoids, irritable bowel syndrome, jaundice, light color stool, liver problems, rectal bleeding, and  rectal pain.     Current Medications (verified): 1)  Clonazepam 2 Mg Tabs (Clonazepam) 2)  Hydrochlorothiazide 25 Mg Tabs (Hydrochlorothiazide) .... Take 1 Tablet By Mouth Once A Day 3)  Trazodone Hcl 100 Mg Tabs (Trazodone Hcl) .... At Bedtime 4)  Miralax  Powd (Polyethylene Glycol 3350) .... Two Times A Day 5)  Colace 100 Mg Caps (Docusate Sodium) .... Two Times A Day 6)  Erythromycin Base 250 Mg Tabs (Erythromycin Base) .... Take One Tab 30 Minutes Before Breakfast,lunch,dinner and At Bedtime. 7)  Milk of Magnesia 400 Mg/48ml Susp (Magnesium Hydroxide) .... 1/2 Cap As Needed  Allergies (verified): 1)  ! * Narcotics  Past History:  Past Medical History: Reviewed history from 05/04/2008 and no changes required.  Current Problems:  CONSTIPATION (ICD-564.00) HYPERCHOLESTEROLEMIA (ICD-272.0) ANXIETY DEPRESSION (ICD-300.4) MORBID OBESITY (ICD-278.01) GLUCOSE INTOLERANCE (ICD-271.3) HYPOKALEMIA (ICD-276.8) HYPERTENSION, HX OF (ICD-V12.50)  Past Surgical History: Reviewed history from 07/11/2008 and no changes required. Bladder surgery Back Surgery Hysterectomy Rectocele  Family History: Reviewed history from 05/04/2008 and no changes required. Family History of Esophageal Cancer:Brother Lung Cancer : Father Family History of Diabetes: Mother  Social History: Reviewed history from 07/11/2008 and no changes required. Occupation: Clinical biochemist Patient has never smoked.  Alcohol Use - no Daily Caffeine Use Illicit Drug Use - no Patient does not get regular exercise.   Review of Systems       The patient complains of fatigue.  The patient denies allergy/sinus, anemia, anxiety-new, arthritis/joint pain, back pain, blood in urine, breast changes/lumps, change in vision, confusion, cough, coughing up blood, depression-new, fainting, fever, headaches-new, hearing problems, heart murmur, heart rhythm changes, itching, menstrual pain, muscle pains/cramps, night sweats, nosebleeds, pregnancy symptoms, shortness of breath, skin rash, sleeping problems, sore throat, swelling of feet/legs, swollen lymph glands, thirst - excessive , urination - excessive , urination changes/pain, urine leakage, vision changes, and voice change.    Vital Signs:  Patient profile:   60 year old female Height:      68 inches Weight:      186 pounds BMI:     28.38 Pulse rate:   72 / minute Pulse rhythm:   regular BP sitting:   120 / 82  (left arm) Cuff size:   regular  Vitals Entered By: Francee Piccolo CMA Duncan Dull) (October 18, 2008 2:40 PM)  Physical Exam  Additional Exam:  She is a healthy-appearing female  skin: anicter  HEENT: normocephalic; PEERLA; no nasal or orpharyngeal abnormalities neck: supple  nodes: no cervical adenopathy chest: clear cor:  no murmurs, gallops or rubs abd:  bowel sounds normoactive; no abdominal masses, tenderness, organomegaly rectal: no masses; stool heme negative ext: no cyanosis, clubbing, or edema skeletal: no gross skeletal abnormalities neuro: alert, oriented x 3; no focal abnormalities    Impression & Recommendations:  Problem # 1:  MELENA (ICD-578.1) She is Hemoccult-negative.  If she has been having truly melenic stools over several days I certainly would expect her stools to be Hemoccult positive.  In addition, her hemoglobin is stable.  Recommendations #1 followup Hemoccults  Problem # 2:  GASTROPARESIS (ICD-536.3) Assessment: Unchanged  Orders:I do not think erythromycin is helping her symptoms.  She's now willing to try domperidone  Recommendations #1 gastroparesis diet TLB-T4 (Thyrox), Total (84436-T4) TLB-TSH (Thyroid Stimulating Hormone) (84443-TSH)  Problem # 3:  ANXIETY DEPRESSION (ICD-300.4) Patient claims that she is unable to lose weight.  I have agreed to check thyroid tests to rule out hypothyroidism.  Patient Instructions: 1)  cc Dow Adolph, MD 2)  Please go the the basement today for labs 3)  We are giving you a printed rx of Domperidone today and all information to fax to [place] 4)  Call Zella Ball if you have any questions about prescription 5)  The medication list was reviewed and reconciled.  All changed / newly prescribed medications were explained.  A complete medication list was provided to the patient / caregiver. Prescriptions: DOMPERIDONE 10MG  take 1 tablet by mouth before meals and at bedtime  #28 x 10   Entered by:   Merri Ray CMA (AAMA)   Authorized by:   Louis Meckel MD   Signed by:   Merri Ray CMA (AAMA) on 10/18/2008   Method used:   Print then Give to Patient   RxID:   [id number]

## 2010-05-09 NOTE — Progress Notes (Signed)
Summary: TRIAGE-Black,tarry & foul smelling stools.  Phone Note Call from Patient Call back at Work Phone [phone]   Caller: Patient Call For: [facility]  Reason for Call: Talk to Nurse Summary of Call: black stools x2 weeks  Initial call taken by: Guadlupe [facility],  October 18, 2008 9:08 AM  Follow-up for Phone Call        Last week pt. had 3-4 days of  black, tarry and foul smelling stools with some nausea & intermittent epigastric pain. Took Pepto-bismol X 2 on Saturday & Sunday, after the 3-4 days of black stools, for continued  nausea and indigestion.  Doesn't take a PPI, cannot afford it. Denies lower abd. pain, no worse constipation than usual, diarrhea, blood, fever. Today stools are lighter, but still has black mixed in.  For CBCD and OV with Dr.Bladimir Auman today at 2:15pm. Follow-up by: Laureen Ochs LPN,  October 18, 2008 12:03 PM

## 2010-05-09 NOTE — Procedures (Signed)
Summary: EGD   EGD  Procedure date:  05/11/2008  Findings:      Location: [facility]    ENDOSCOPY PROCEDURE REPORT  PATIENT:  [name], [name]  MR#:  [id number] BIRTHDATE:   1951-02-27   GENDER:   female  ENDOSCOPIST:   Barbette Hair. Arlyce Dice, MD Referred by:   PROCEDURE DATE:  05/11/2008 PROCEDURE:  EGD, diagnostic ASA CLASS:   Class II INDICATIONS: bloating, nausea   MEDICATIONS:    Fentanyl 50 mcg, Versed 7 mg, glycopyrrolate (Robinal) 0.2, Benadryl 25, 0.6cc simethancone 0.6 cc PO TOPICAL ANESTHETIC:   Exactacain Spray  DESCRIPTION OF PROCEDURE:   After the risks benefits and alternatives of the procedure were thoroughly explained, informed consent was obtained.  The LB GIF-H180 [id number] endoscope was introduced through the mouth and advanced to the third portion of the duodenum, without limitations.  The instrument was slowly withdrawn as the mucosa was fully examined. <<PROCEDUREIMAGES>>        <<OLD IMAGES>>  The upper, middle, and distal third of the esophagus were carefully inspected and no abnormalities were noted. The z-line was well seen at the GEJ. The endoscope was pushed into the fundus which was normal including a retroflexed view. The antrum,gastric body, first and second part of the duodenum were unremarkable (see image1, image2, image3, image5, and image4).    Retroflexed views revealed no abnormalities.    The scope was then withdrawn from the patient and the procedure completed.  COMPLICATIONS:   None  ENDOSCOPIC IMPRESSION:  1) Normal EGD RECOMMENDATIONS:  1) continue current meds  2) My office will arrange for you to have a Gastric Emptying Scan performed. This is a radiology test that gives an idea of how well your stomach functions.  3) follow-up: office 3 week(s)  REPEAT EXAM:   No   _______________________________ Barbette Hair. Arlyce Dice, MD    CC:

## 2010-05-09 NOTE — Progress Notes (Signed)
Summary: GES AND REV SCHEDULED  Phone Note Outgoing Call   Call placed by: Laureen Ochs LPN,  May 12, 2008 12:20 PM Call placed to: Patient Summary of Call: Pt. scheduled for a GES at [facility] on 05-18-08 at 11am. For REV w/Dr.Skyla Champagne on 05-30-08 at 3pm. Pt. instructed to call back as needed.  Initial call taken by: Laureen Ochs LPN,  May 12, 2008 12:23 PM

## 2010-05-09 NOTE — Progress Notes (Signed)
Summary: TRIAGE  Phone Note Call from Patient Call back at Home Phone [phone]   Call For: DR Arlyce Dice Reason for Call: Talk to Nurse Summary of Call: Has gastroparesis and her stomach really hurts. Feels like she has to go and can not. Her stomach is really big and she was crying alot on the phone. Initial call taken by: Leanor Kail [facility],  July 10, 2008 4:40 PM  Follow-up for Phone Call        Mesage left for patient to callback.Laureen Ochs LPN  July 11, 979 4:51 PM  Pt. states she hasn't had a BM in 3 days, abd. is bloated, painful since Friday. Pain is getting worse, "I am just miserable, I hurt so bad and I am vomiting!' Pt. advised to be evaluated in the ER ASAP. Pt. does have an appoinmtnet w/Dr.Jeferson Boozer in the morning, states she cannot wait, she is hurting too bad. I advised that if the ER examines her and she is sent home then she needs to keep that appointment. If she is admitted to please have someone call to let me know. Follow-up by: Laureen Ochs LPN,  July 10, 2008 5:13 PM

## 2010-05-09 NOTE — Procedures (Signed)
Summary: Colonoscopy   Colonoscopy  Procedure date:  03/22/2008  Findings:      Location:  [facility].    COLONOSCOPY PROCEDURE REPORT  PATIENT:  [name], [name]  MR#:  [id number] BIRTHDATE:   Jun 02, 1950   GENDER:   female  ENDOSCOPIST:   Barbette Hair. Arlyce Dice, MD Referred by:   PROCEDURE DATE:  03/22/2008 PROCEDURE:  Colonoscopy, diagnostic ASA CLASS:   Class II INDICATIONS: 1) constipation  2) rectal bleeding   MEDICATIONS:    Fentanyl 125 mcg IV, Versed 12.5 mg IV, Benadryl 50 mg IV  DESCRIPTION OF PROCEDURE:   After the risks benefits and alternatives of the procedure were thoroughly explained, informed consent was obtained.  Digital rectal exam was performed and revealed no abnormalities.   The  endoscope was introduced through the anus and advanced to the cecum, which was identified by both the appendix and ileocecal valve, without limitations.  The quality of the prep was excellent.  The instrument was then slowly withdrawn as the colon was fully examined. <<PROCEDUREIMAGES>>          <<OLD IMAGES>>  FINDINGS:  A normal appearing cecum, ileocecal valve, and appendiceal orifice were identified. The ascending, transverse, descending, sigmoid colon, and rectum appeared unremarkable (see image001, image002, image003, image004, image005, and image006).   Retroflexed views in the rectum revealed no abnormalities.    The time to cecum =  minutes. The scope was then withdrawn (time =  min) from the patient and the procedure completed.  COMPLICATIONS:   None  ENDOSCOPIC IMPRESSION:  1) Normal colon  Rectal Bleeding secondary to Hemorrhoids RECOMMENDATIONS:  Miralax 17gm QD  REPEAT EXAM:   No   _______________________________ Barbette Hair. Arlyce Dice, MD    CC:  This report was created from the original endoscopy report, which was reviewed and signed by the above listed endoscopist.

## 2010-05-09 NOTE — Assessment & Plan Note (Signed)
Summary: F/U ON TRIAGE FROM 06-23-08          [facility]   History of Present Illness Visit Type: Follow-up Visit Primary GI MD: Melvia Heaps MD [facility] Primary Provider: [facility] Chief Complaint: gastroparesis History of Present Illness:   [name] has returned for followup of her gastroparesis.  Symptoms continue despite trying erythromycin.  She had some side effects from oral Reglan.  She complains of abdominal bloating and nausea and occasional episodes of diarrhea.     GI Review of Systems    Reports abdominal pain, bloating, nausea, and  vomiting.     Location of  Abdominal pain: lower abdomen.    Denies acid reflux, belching, chest pain, dysphagia with liquids, dysphagia with solids, heartburn, loss of appetite, vomiting blood, weight loss, and  weight gain.      Reports constipation and  diarrhea.     Denies anal fissure, black tarry stools, change in bowel habit, diverticulosis, fecal incontinence, heme positive stool, hemorrhoids, irritable bowel syndrome, jaundice, light color stool, liver problems, rectal bleeding, and  rectal pain.    Current Medications (verified): 1)  Clonazepam 2 Mg Tabs (Clonazepam) 2)  Hydrochlorothiazide 25 Mg Tabs (Hydrochlorothiazide) .... Take 1 Tablet By Mouth Once A Day 3)  Trazodone Hcl 100 Mg Tabs (Trazodone Hcl) .... At Bedtime 4)  Miralax  Powd (Polyethylene Glycol 3350) .... Two Times A Day 5)  Colace 100 Mg Caps (Docusate Sodium) .... Two Times A Day 6)  Erythromycin Base 250 Mg Tabs (Erythromycin Base) .... Take One Tab 30 Minutes Before Breakfast,lunch,dinner and At Bedtime. 7)  Milk of Magnesia 400 Mg/58ml Susp (Magnesium Hydroxide) .... 1/2 Cap As Needed  Allergies (verified): 1)  ! * Narcotics  Past History:  Past Medical History:    Reviewed history from 05/04/2008 and no changes required:    Current Problems:     CONSTIPATION (ICD-564.00)    HYPERCHOLESTEROLEMIA (ICD-272.0)    ANXIETY DEPRESSION (ICD-300.4)    MORBID  OBESITY (ICD-278.01)    GLUCOSE INTOLERANCE (ICD-271.3)    HYPOKALEMIA (ICD-276.8)    HYPERTENSION, HX OF (ICD-V12.50)  Past Surgical History:    Bladder surgery    Back Surgery    Hysterectomy    Rectocele  Family History:    Reviewed history from 05/04/2008 and no changes required:       Family History of Esophageal Cancer:Brother       Lung Cancer : Father       Family History of Diabetes: Mother  Social History:    Occupation: Clinical biochemist    Patient has never smoked.     Alcohol Use - no    Daily Caffeine Use    Illicit Drug Use - no    Patient does not get regular exercise.   Review of Systems       The patient complains of allergy/sinus, anxiety-new, back pain, depression-new, fatigue, headaches-new, muscle pains/cramps, skin rash, and thirst - excessive.    Vital Signs:  Patient profile:   60 year old female Height:      68 inches Weight:      179 pounds BMI:     27.32 Pulse rate:   80 / minute Pulse rhythm:   regular BP sitting:   112 / 74  (left arm)  Vitals Entered By: June McMurray CMA (July 11, 2008 10:19 AM)   Impression & Recommendations:  Problem # 1:  GASTROPARESIS (ICD-536.3) Assessment Unchanged Patient will consider enrollment in a gastroparesis trial.  Domperidone was offered her but  she has some financial constraints and will look further into considering this study.  Patient Instructions: 1)  cc Dow Adolph, MD

## 2010-05-09 NOTE — Assessment & Plan Note (Signed)
Summary: VOMITING BLOOD /SEVERE CONSTIPATION      DEBORAH   History of Present Illness Visit Type: follow up Primary GI MD: Melvia Heaps MD [facility] Primary Provider: n/a Chief Complaint: vomiting blood, constipation History of Present Illness:   Patient is worked in today for upper abdominal complaints. She was hospitalized in December with a UTI. We saw her during that admission for constipation and  hematochezia. A colonoscopy was negative except for hemorrhoids.  Constipation is currently being managed with an aggressive bowel regimen. Today patient gives a three month history of post-prandial nausea and vomiting, heartburn, and bloating.  Symptoms mainly post-prandial. Emesis mainly undigested food but this week vomited bright red blood. CBC today at 1pm is normal with Hgb of 13.9. No black stools. Takes Ibuprofen for back pain, averages 3-5 doses a month. No history of PUD. Started on Aciphex yesterday by [facility]. No dysphagia. No weight loss. Under a lot of stress related to ongoing trial related to murder of son as well as the death of another son five months later.   GI Review of Systems    Reports acid reflux, bloating, heartburn, nausea, and  vomiting blood.      Denies abdominal pain, belching, chest pain, dysphagia with liquids, dysphagia with solids, loss of appetite, vomiting, weight loss, and  weight gain.      Reports constipation, hemorrhoids, and  rectal bleeding.     Denies anal fissure, black tarry stools, change in bowel habit, diarrhea, diverticulosis, fecal incontinence, heme positive stool, irritable bowel syndrome, jaundice, light color stool, liver problems, and  rectal pain.     Prior Medications Reviewed Using: Patient Recall  Updated Prior Medication List: CLONAZEPAM 2 MG TABS (CLONAZEPAM)  HYDROCHLOROTHIAZIDE 25 MG TABS (HYDROCHLOROTHIAZIDE)  TRAZODONE HCL 100 MG TABS (TRAZODONE HCL) at bedtime MIRALAX  POWD (POLYETHYLENE GLYCOL 3350) two times a day COLACE  100 MG CAPS (DOCUSATE SODIUM) two times a day  Current Allergies: No known allergies   Past Medical History:    Diet Controlled Diabetes    HTN    Anxiety  Past Surgical History:    Back Surgery    Bladder Repair    Breast-Lumpectomy    Cholecystectomy    Hysterectomy    Leg Surgery   Family History:    Family History of Esophageal Cancer:Brother    Lung Cancer : Father    Family History of Diabetes: Mother  Social History:    Occupation:     Patient has never smoked.     Alcohol Use - no    Daily Caffeine Use    Illicit Drug Use - no    Patient does not get regular exercise.    Risk Factors:  Tobacco use:  never Drug use:  no Alcohol use:  no Exercise:  no   Review of Systems  The patient denies allergy/sinus, anemia, anxiety-new, arthritis/joint pain, back pain, blood in urine, breast changes/lumps, change in vision, confusion, cough, coughing up blood, depression-new, fainting, fatigue, fever, headaches-new, hearing problems, heart murmur, heart rhythm changes, itching, menstrual pain, muscle pains/cramps, night sweats, nosebleeds, pregnancy symptoms, shortness of breath, skin rash, sleeping problems, sore throat, swelling of feet/legs, swollen lymph glands, thirst - excessive , urination - excessive , urination changes/pain, urine leakage, vision changes, and voice change.     Vital Signs:  Patient Profile:   60 Years Old Female Height:     68 inches Weight:      179.50 pounds BMI:     27.39 Pulse  rate:   80 / minute Pulse rhythm:   regular BP sitting:   120 / 82  (right arm)  Vitals Entered By: June McMurray CMA (May 04, 2008 1:56 PM)                  Physical Exam  General:     Well developed, well nourished, no acute distress. Head:     Normocephalic and atraumatic. Eyes:     Conjunctiva pink, no icterus.  Mouth:     No deformity or lesions, dentition normal. Neck:     Supple; no masses. Lungs:     Clear throughout to  auscultation. Heart:     Regular rate and rhythm; no murmurs, rubs,  or bruits. Abdomen:     Soft, nontender and nondistended. No masses, hepatosplenomegaly or hernias noted. Normal bowel sounds. Msk:     Symmetrical with no gross deformities. Normal posture. Extremities:     No palmar erythema, no edema.  Neurologic:     Alert and  oriented x4;  grossly normal neurologically. Skin:     Intact without significant lesions or rashes. Cervical Nodes:     No significant cervical adenopathy. Psych:     Alert and cooperative. Normal mood and affect.    Impression & Recommendations:  Problem # 1:  NAUSEA AND VOMITING (ICD-787.01) Associated with bloating. Symptoms may be secondary to delayed gastric emptying. Low suspicion for bowel obstruction. Rule out PUD.The patient will be scheduled for an EGD with biopsies (if indicated).  The risks and benefits of the procedure, as well as alternatives were discussed with the patient and she agrees to proceed.   Problem # 2:  GERD (ICD-530.81) Manifested by pyrosis and regurgitation. Continue PPI. Will evaluate further at time of EGD.  Problem # 3:  HEMATEMESIS (ICD-578.0) By history, sounds like a mallory-weiss tear from frequent vomiting. No pallor on exam, no black stool, stable vital signs and normal hgb today all point to low volume bleed. Will further evaluate at time of EGD. Continue PPI.   Patient Instructions: 1)  We have sent a prescription for zofran to your pharmacy. 2)  Continue the Aciphex. 3)  We have scheduled the Endoscopy with Dr Arlyce Dice on 05-11-2008 .  Please arrive on our 4th florr @ 1:30PM.     Prescriptions: ZOFRAN 8 MG TABS (ONDANSETRON HCL) Take 1 tab every 6 hours  #30 x 0   Entered by:   Lowry Ram CMA   Authorized by:   Willette Cluster NP   Signed by:   Lowry Ram CMA on 05/04/2008   Method used:   Electronically to        [facility] [id number]* (retail)       [address]       [place], [place]   [address]       Ph: [phone]       Fax: [phone]   RxID:   [id number]

## 2010-05-09 NOTE — Progress Notes (Signed)
Summary: FYI  Phone Note Call from Patient Call back at Home Phone [phone]   Caller: Patient Call For: Arlyce Dice Reason for Call: Talk to Nurse Summary of Call: Patient wants to let Dr.Hydia Copelin  know that the medication given to her (Domperidon) works really great. Initial call taken by: Tawni Levy,  November 23, 2008 12:04 PM  Follow-up for Phone Call        excellent Follow-up by: Louis Meckel MD,  November 24, 2008 8:50 AM

## 2010-07-16 LAB — POCT URINALYSIS DIP (DEVICE)
Bilirubin Urine: NEGATIVE
Glucose, UA: NEGATIVE mg/dL
Ketones, ur: NEGATIVE mg/dL
Nitrite: POSITIVE — AB
Protein, ur: >=300 mg/dL — AB
Specific Gravity, Urine: <=1.005 (ref 1.005–1.030)
Urobilinogen, UA: 0.2 mg/dL (ref 0.0–1.0)
pH: 5 (ref 5.0–8.0)

## 2010-07-16 LAB — URINE CULTURE: Colony Count: 15000

## 2010-07-17 LAB — POCT URINALYSIS DIP (DEVICE)
Glucose, UA: NEGATIVE mg/dL
Nitrite: NEGATIVE
Protein, ur: 100 mg/dL — AB
Specific Gravity, Urine: >=1.03 (ref 1.005–1.030)
Urobilinogen, UA: 0.2 mg/dL (ref 0.0–1.0)
pH: 5 (ref 5.0–8.0)

## 2010-07-23 LAB — URINALYSIS, ROUTINE W REFLEX MICROSCOPIC
Bilirubin Urine: NEGATIVE
Glucose, UA: NEGATIVE mg/dL
Hgb urine dipstick: NEGATIVE
Ketones, ur: NEGATIVE mg/dL
Nitrite: NEGATIVE
Protein, ur: NEGATIVE mg/dL
Specific Gravity, Urine: 1.013 (ref 1.005–1.030)
Urobilinogen, UA: 0.2 mg/dL (ref 0.0–1.0)
pH: 7.5 (ref 5.0–8.0)

## 2010-07-23 LAB — COMPREHENSIVE METABOLIC PANEL
ALT: 21 U/L (ref 0–35)
AST: 25 U/L (ref 0–37)
Albumin: 4.2 g/dL (ref 3.5–5.2)
Alkaline Phosphatase: 55 U/L (ref 39–117)
BUN: 12 mg/dL (ref 6–23)
CO2: 30 mEq/L (ref 19–32)
Calcium: 10.2 mg/dL (ref 8.4–10.5)
Chloride: 100 mEq/L (ref 96–112)
Creatinine, Ser: 0.83 mg/dL (ref 0.4–1.2)
GFR calc Af Amer: >60 mL/min (ref >60)
GFR calc non Af Amer: >60 mL/min (ref >60)
Glucose, Bld: 114 mg/dL — ABNORMAL HIGH (ref 70–99)
Potassium: 3.5 mEq/L (ref 3.5–5.1)
Sodium: 139 mEq/L (ref 135–145)
Total Bilirubin: 0.8 mg/dL (ref 0.3–1.2)
Total Protein: 7.5 g/dL (ref 6.0–8.3)

## 2010-07-23 LAB — DIFFERENTIAL
Basophils Absolute: 0.1 10*3/uL (ref 0.0–0.1)
Basophils Relative: 2 % — ABNORMAL HIGH (ref 0–1)
Eosinophils Absolute: 0.1 10*3/uL (ref 0.0–0.7)
Eosinophils Relative: 2 % (ref 0–5)
Lymphocytes Relative: 51 % — ABNORMAL HIGH (ref 12–46)
Lymphs Abs: 2.5 10*3/uL (ref 0.7–4.0)
Monocytes Absolute: 0.5 10*3/uL (ref 0.1–1.0)
Monocytes Relative: 10 % (ref 3–12)
Neutro Abs: 1.7 10*3/uL (ref 1.7–7.7)
Neutrophils Relative %: 36 % — ABNORMAL LOW (ref 43–77)

## 2010-07-23 LAB — CBC
HCT: 42 % (ref 36.0–46.0)
Hemoglobin: 14.5 g/dL (ref 12.0–15.0)
MCHC: 34.6 g/dL (ref 30.0–36.0)
MCV: 95.2 fL (ref 78.0–100.0)
Platelets: 251 10*3/uL (ref 150–400)
RBC: 4.42 MIL/uL (ref 3.87–5.11)
RDW: 12.1 % (ref 11.5–15.5)
WBC: 4.8 10*3/uL (ref 4.0–10.5)

## 2010-07-23 LAB — LIPASE, BLOOD: Lipase: 36 U/L (ref 11–59)

## 2010-07-23 LAB — POCT PREGNANCY, URINE: Preg Test, Ur: NEGATIVE

## 2010-08-20 NOTE — Discharge Summary (Signed)
NAMEAUBRIA, [name] NO.:  [id number]   MEDICAL RECORD NO.:  [id number]          PATIENT TYPE:  OBV   LOCATION:  5039                         FACILITY:  [facility]   PHYSICIAN:  Herbie Saxon, MDDATE OF BIRTH:  05-13-1950   DATE OF ADMISSION:  03/20/2008  DATE OF DISCHARGE:  03/23/2008                               DISCHARGE SUMMARY   DISCHARGE DIAGNOSES:  1. Lower gastrointestinal secondary to history of hemorrhoids.  2. Chronic severe constipation, improved on MiraLax.  3. Escherichia coli urinary tract infection, improved on Bactrim.  4. Hypertension with borderline hypotension, improving on IV fluid      hydration.  5. Diabetes, stable, diet controlled.  6. Psych disorder.  7. History of anxiety, depression.  8. History of hyperlipidemia.   CONSULTS:  Barbette Hair. Arlyce Dice, MD, [facility], Gastroenterology.   PROCEDURES:  Colonoscopy performed on this admission on March 22, 2008, was normal.   RADIOLOGY:  The abdominal x-ray of March 20, 2008, shows moderate  stool blood and no acute findings.   HOSPITAL COURSE:  This 60 year old lady presented to the emergency room  with severe constipation, bloating, anorexia, nausea, vomiting after  eating.  Noted to have positive hemoccult.  Gastroenterology was  subsequently consulted on the colonoscopy that was done was normal.  They are recommending to continue with MiraLax daily and Anusol  Suppository as needed.  They believe that GI bleed was probably  secondary to hemorrhoids.  Her constipation is improved with the  laxatives and stool softeners and anemia is mild.  She was noticed to  having borderline hypotension and diuretic HCTZ medications is being put  on hold for now.  This could be restarted as outpatient once reviewed by  the primary care physician at [facility].   DISCHARGE CONDITION:  Stable.   DIET:  1800 calorie ADA, heart healthy.   ACTIVITY:  No restrictions.   FOLLOWUP:  With the  primary care physician in [facility] in the next 5-  7 days.  Followup with Dr. Arlyce Dice, Gastroenterology, as needed in the  next 6-8 weeks at the primary care physician's discretion.   MEDICATIONS ON DISCHARGE:  1. Septra 1 twice daily for the next 3 days.  2. Colace 100 mg twice daily.  3. MiraLax 17 g daily as needed.  4. Anusol Suppository 1 nightly as needed.   PHYSICAL EXAMINATION:  GENERAL:  She is a middle-aged lady not in acute  distress, mildly pale, not jaundiced.  VITAL SIGNS:  Temperature is 98, pulse 58, respiratory rate is 16, blood  pressure 192/62.  HEAD:  Atraumatic, normocephalic.  Mucous membranes are moist.  NECK:  Supple.  CHEST:  Clinically clear.  HEART:  S1, S2.  Regular rate and rhythm.  No murmur, gallops, or rubs.  ABDOMEN:  Soft, nontender.  Bowel sounds present.  NEUROLOGIC:  She is alert and oriented to time, place, and person.  No  neurological deficits.  Peripheral pulses present.  No pedal edema.   LABORATORY DATA:  Hematocrit is 35.  Urine culture E. coli, sensitive to  ampicillin, Rocephin, Cipro, Septra.  Chemistry, sodium  is 136,  potassium 3.7, chloride 101, bicarbonate 26, glucose 82, BUN 10,  creatinine 0.9, hemoglobin A1c is 5.6.   The patient's illness, medications, past treatment and discharge plan  explained to her.  She verbalizes understanding.  Discharge greater than  30 minutes.      Herbie Saxon, MD  Electronically Signed     MIO/MEDQ  D:  03/23/2008  T:  03/24/2008  Job:  [id number]   cc:   Barbette Hair. Arlyce Dice, MD,FACG  [facility]

## 2010-08-20 NOTE — H&P (Signed)
[name], [name] NO.:  [id number]   MEDICAL RECORD NO.:  [id number]          PATIENT TYPE:  OBV   LOCATION:  5039                         FACILITY:  [facility]   PHYSICIAN:  Darryl D. Prime, MD    DATE OF BIRTH:  [birth year]   DATE OF ADMISSION:  03/20/2008  DATE OF DISCHARGE:                              HISTORY & PHYSICAL   Patient is full code.  Patient was the historian.  She was a good  historian.   CHIEF COMPLAINT:  Still constipated.   HISTORY OF PRESENT ILLNESS:  [name] is a 60 year old female who  notes significant constipation here and presented to the emergency room  for this.  Patient notes significant stressors over the last year, as  she lost 2 sons, one by murder and another by myocardial infarction.  She has been significantly depressed and has had significant anxiety,  possible agoraphobia due to this.  She has been on high-dose trazodone  for sleep.   Patient over the last 2 weeks notes no bowel movements.  She has tried  mag citrate, 2 enemas, Colace, and in the emergency room here, she has  tried an enema, to no avail.  The physician's assistant in the ER  requested admission for significant constipation.   She notes abdominal pain and feels very bloated.  She is unable to eat  and has had multiple bouts of nausea and vomiting after eating.  This is  within 5 minutes of eating.  The nausea and vomiting, however, predates  the constipation up to 6 weeks, and she has had blood in her stool 3  weeks ago before the constipation started.   In the emergency room, she was given Macrobid for her urinary tract  infection.   PAST MEDICAL/SURGICAL HISTORY:  1. She has a history of molluscum contagiosum, seen here in the past      for this and given PCA therapy here recently.  This was in the      right inner thigh.  She refused HIV testing at the time.  2. History of depression.  3. History of total abdominal hysterectomy.  4. History of  gallbladder surgery.  5. History of a bladder sling.  6. History of hyperlipidemia.  7. History of 2 back surgeries.  8. History of urinary tract infection.  9. History of hypertension.  10.History of diabetes, diet-controlled.   ALLERGIES:  She is allergic to PENICILLIN and HYDROCODONE.   MEDICATIONS:  1. Klonopin 0.1 mg as needed for anxiety.  2. Hydrochlorothiazide 25 mg daily.  3. Trazodone 200 mg q.h.s.   FAMILY HISTORY:  Negative for colon or stomach cancer.   SOCIAL HISTORY:  A recent loss of 2 sons, as above.  No alcohol,  tobacco, or drug use ever or current.  She lives with her mother and her  youngest son.   REVIEW OF SYSTEMS:  A 14-point review of systems negative unless as  stated above.   PHYSICAL EXAMINATION:  VITAL SIGNS:  Temperature 97.8, blood pressure  116/74, pulse 74, respiratory rate 12, sats 98% on room air.  GENERAL:  Patient is a female who looks her stated age, lying in bed in  no acute distress.  HEENT:  Normocephalic and atraumatic.  Pupils are equal, round and  reactive to light.  Extraocular movements are intact.  Oropharynx is  dry.  NECK:  Supple with no lymphadenopathy or thyromegaly.  No jugular venous  distention.  No carotid bruits.  LUNGS:  Clear to auscultation bilaterally.  CARDIOVASCULAR:  Regular rate and rhythm with no murmurs, rubs or  gallops.  ABDOMEN:  Mildly distended.  Diffusely tender to palpation.  There is no  rebound tenderness or signs of an acute abdomen.  She has hyperactive  bowel sounds.  EXTREMITIES:  No clubbing, cyanosis or edema.  NEUROLOGIC:  She is alert and oriented x4 with cranial nerves II-XII  grossly intact.  Strength and sensation are grossly intact.   Sodium is 140 with a potassium of 4.1, chloride 106, bicarb 28, BUN 16,  creatinine 0.78, glucose 98, otherwise normal LFTs.  Lipase was 38.  White count is 6.5 with a hemoglobin of 13.7, hematocrit 39.5 with  platelets 237, segs 59, leukocytes 31.   Urinalysis:  11-20 blood, large-  moderate bacteria.   X-ray of the abdomen showing moderate stool burden.  She had  calcification in the left upper quadrant with concern for possible  distant adrenal hemorrhage on the left.   ASSESSMENT/PLAN:  This is a patient with a history of significant  constipation lately, probably related to the trazodone, although this is  unclear.  What is concerning is patient had blood in her stools in the  past and may need a GI workup for this significant constipation to rule  out neoplasm.  She will be admitted and will be given GoLytely 4 liters  throughout the night, for 23-hour observation.  She does request a  private room due to significant anxiety.  For the gastrointestinal  issues, we will Hemoccult her stools and decrease her trazodone.  We  will check a TSH.  She will be given Reglan as well.  She may need again  a GI evaluation at some point, possibly as an outpatient.  We will  continue her home meds but at decreased dose of trazodone and  hydrochlorothiazide, due to her constipation and follow.  For her  urinary tract infection, we will get a urine culture and place her on  Bactrim.      Darryl D. Prime, MD  Electronically Signed     DDP/MEDQ  D:  03/21/2008  T:  03/21/2008  Job:  [id number]
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
## 2010-08-20 NOTE — Group Therapy Note (Signed)
NAMEPA, TENNANT NO.:  [id number]   MEDICAL RECORD NO.:  [id number]          PATIENT TYPE:  WOC   LOCATION:  [facility]                   FACILITY:  [facility]   PHYSICIAN:  Johnella Moloney, MD        DATE OF BIRTH:  [birth year]   DATE OF SERVICE:  01/13/2008                                  CLINIC NOTE   The patient is a 60 year old with recent diagnosis of molluscum  contagiosum who was seen on December 30, 2006 for TCA treatment.  The  patient underwent TCA treatment of a patch of molluscum that was noted  on her right inner thigh. This treatment was halted due to patient  discomfort. After she went home she would describe having intense  burning in the perineal area and an erosion of the skin and ulceration  of the treatment. The patient started treating it with neosporin cream  and was taking pain medication as needed.  She is here today for  evaluation and discussion of other modalities of treatment for  molluscum.   PHYSICAL EXAMINATION:  VITAL SIGNS:  On examination the patient is  afebrile.  Vital signs are stable.  PELVIC EXAMINATION:  On pelvic examination the patient is noted to have  a 3 cm x 1 cm area of healing open area on her right inner thigh. Of  note there are scattered pox-like lesions noted involving bilateral  inner thighs, bilateral labia majora and her mons pubis which is  concerning for the molluscum.  The patient's area of concern, which is  her inner thigh where she had the treatment, is noted to be well  healing.   IMPRESSION:  The patient with molluscum contagiosum, status post TCA  treatment, now with ulceration from the therapy. Discussed with the  patient that the only other methods of treatment include curettage of  the lesions versus continuing treatment with salicylic acid, lactic acid  or trichloroacetic acid, or Aldara cream. The patient does not have any  insurance or have any financial means of getting the Aldara cream, so  she is going to apply for assistance from the pharmaceutical company  that does prescribe Aldara to see if she can get this. She was told the  molluscum contagiosum is usually a self-limited condition; however,  given the extensive nature of her lesions and that she has had this  since July, that she might need further workup to see if she had any  condition that could be depressing her immune system.  The patient does  say that she has not been taking care of herself in the last few months,  and she recently endured the loss of two children, but she does have a  history of diabetes in the past and this could be a factor. The patient  was offered a chance to be tested for diabetes today in the form of the  of getting a random glucose or an HG A1c, also offered testing for HIV  but the patient declines that at this point.  She will follow up with  the application for the pharmaceutical company and see if  she can get  assistance for Aldara. Until then the patient is to follow up as needed  in the GYN clinic.           ______________________________  Johnella Moloney, MD     UD/MEDQ  D:  01/13/2008  T:  01/13/2008  Job:  [id number]

## 2010-08-23 NOTE — H&P (Signed)
[facility]  Patient:    [name], [name] Visit Number: [id number] MRN: [id number]          Service Type: EMS Location: ED Attending Physician:  Tobey Bride Dictated by:   Marcelle Overlie, M.D. Admit Date:  07/04/2001                           History and Physical  HISTORY OF PRESENT ILLNESS:  This is a 60 year old female who is admitted preoperatively for surgical treatment of a cystocele, rectocele, and genuine stress urinary incontinence.  She complains of symptomatic stress urinary incontinence.  She also complains of severe constipation as well as pelvic pressure with walking.  PAST MEDICAL HISTORY:  Unremarkable.  She does have some hypertension and reflux.  MEDICATIONS: 1. HCTZ 25 mg, 1 p.o. q.d. 2. Paxil CR 25 mg, 1 p.o. q.h.s. 3. Prevacid:  She is unsure of the dose, but she takes as needed for    heartburn.  PAST SURGICAL HISTORY:  Hysterectomy in the past.  REVIEW OF SYSTEMS:  Really unremarkable except for the urinary incontinence and some insomnia.  SOCIAL HISTORY:  She does not smoke.  She does not drink.  PHYSICAL EXAMINATION:  VITAL SIGNS:  On admission temperature was 97 degrees, heart rate 84, respirations 18, BP 146/90.  Weight 242 pounds.  Her O2 saturations was 97% on room air.  LUNGS:  Clear to auscultation.  CARDIAC:  Regular rate and rhythm.  No murmurs, rubs, or gallops.  ABDOMEN:  Soft and nontender.  PELVIC:  External genitalia within normal limits.  However, she had a grade 3 cystocele, grade 2 rectocele.  Good vaginal vault support.  No pelvic masses.  ADMISSION LABORATORY DATA:  White blood cell count 4.4, hemoglobin 13.9. Urinalysis was performed, which was negative.  IMPRESSION:  Cystocele, rectocele, genuine stress urinary incontinence.  PLAN:  Anterior and posterior colporrhaphy with a pubovaginal sling.Dictated by:   Marcelle Overlie, M.D. Attending Physician:  Tobey Bride DD:   07/08/01 TD:  07/08/01 Job: [id number]

## 2010-08-23 NOTE — Discharge Summary (Signed)
[facility]  Patient:    MOE, BRIER Visit Number: [id number] MRN: [id number]          Service Type: EMS Location: ED Attending Physician:  Tobey Bride Dictated by:   Marcelle Overlie, M.D. Admit Date:  07/04/2001 Disc. Date: 06/23/01                             Discharge Summary  ADMISSION DIAGNOSES: 1. Cystocele. 2. Rectocele. 3. Genuine stress urinary incontinence.  DISCHARGE DIAGNOSES: 1. Cystocele. 2. Rectocele. 3. Genuine stress urinary incontinence.  HOSPITAL COURSE:  The patient is a 60 year old female who has a symptomatic cystocele, rectocele and genuine stress urinary incontinence.  On the day of admission, she underwent an anterior and posterior repair with a pubovaginal sling.  The patient had an uncomplicated intraoperative course and by postop day #1, she was doing very well.  She had no complaints.  She was ambulating slowly.  She did have some suprapubic pain which we felt was most likely secondary to the suprapubic catheter.  However, by postop day #2, she was in excellent condition.  She was discharged home on postop day #2.  She was voiding a little bit on her own and she was given Tylox to take as needed for pain as well as Keflex 250 mg three times a day x3 days.  FOLLOWUP:  The patient will follow up with Dr. Noralee Chars office for removal of the suprapubic catheter.  She will follow up in Dr. Mariana Arn office in four weeks.  SPECIAL INSTRUCTIONS:  She was advised to call if she has any nausea, vomiting, abdominal pain or temperature greater than 100.5. Dictated by:   Marcelle Overlie, M.D. Attending Physician:  Tobey Bride DD:  07/08/01 TD:  07/08/01 Job: [id number]

## 2010-08-23 NOTE — Op Note (Signed)
[facility]  Patient:    [name], [name] Visit Number: [id number] MRN: [id number]          Service Type: SUR Location: 3W 4782 01 Attending Physician:  Thermon Leyland Dictated by:   Barron Alvine, M.D. Proc. Date: 06/21/01 Admit Date:  06/21/2001   CC:         [name], M.D.   Operative Report  PREOPERATIVE DIAGNOSIS:  Stress urinary incontinence.  POSTOPERATIVE DIAGNOSIS:  Stress urinary incontinence.  PROCEDURE:  Pubovaginal sling with suprapubic tube and flexible cystoscopy.  SURGEON:  Barron Alvine, M.D.  ANESTHESIA:  General.  INDICATIONS:  [name] is a 60 year old female. She has had problems with one-year history of stress urinary incontinence. She has some leakage without awareness. She has moderate frequency and significant nocturia as well. On exam she had a grade 2 cystocele. She had some sensory urgency. She had obvious moderate stress incontinence with a positive Marshalls test. Dr. Vincente Poli diagnosed her with a cystocele and rectocele and planned anterior and posterior repair. We became involved in her care to consider concurrent pubovaginal sling. The patient underwent extensive discussion with regard to the advantages and disadvantages of this. She is told that the procedure is specifically designed to aid in the stress incontinence and the irritative voiding symptoms are more unpredictable. She understood the advantages and disadvantages as well as all the potential complications including worsening of voiding dysfunction, urinary retention, bleeding, infection, etc.  TECHNIQUE AND FINDINGS:  The patient had already had successful induction of general endotracheal anesthesia and was in the moderate lithotomy position when we became involved in her care in the OR. Dr. Vincente Poli had performed the anterior repair and had trimmed redundant vaginal mucosa leaving the anterior vaginal incision open. She had also  completed the rectocele repair. A Foley catheter was indwelling and the bladder was drained. At that point, we had to extend the dissection up under the retropubic space bilaterally, which was done without difficulty and we were able to enter the retropubic spaces on both sides. The sling itself was made with a piece of Tutoplast fascia lata measuring approximately 3 x 7 cm and anchored on both ends with #1 nylon suture. A suprapubic incision was made and utilizing the clamp, we were able to pass this out the right side of the incision down through the right side of the vaginal incision with direct digital finger control throughout. The nylon suture was grabbed and brought out the incision and the same thing was done on the left side. The sling was then positioned appropriately and pexed in open position with a 3-0 Vicryl suture. Cystoscopy revealed the sling to be in good position with blue dye from both orifices and a suprapubic tube was placed with direct visual guidance. Once a good position was confirmed, we closed the vaginal incision without the need for any additional trimming of tissue. Vaginal packing was applied. The suprapubic tube was placed again and confirmed to be in good position. The suprapubic incision was closed in a standard manner with plenty of copious antibiotic irrigation. It was tied over two to three fingers. The skin was closed with clips. The patient was brought to recovery room in stable condition. Dictated by:   Barron Alvine, M.D. Attending Physician:  Thermon Leyland DD:  06/21/01 TD:  06/21/01 Job: [id number]

## 2010-08-23 NOTE — Assessment & Plan Note (Signed)
[facility]                                 ON-CALL NOTE   [name], [name]                        MRN:          [id number]  DATE:05/02/2006                            DOB:          [birth year]    Time received 11:44 a.m.  Telephone [phone].  The patient was seen in  the emergency room about five days ago for bronchitis.  Her symptoms  included chest congestion, sore throat, mild shortness of breath and a  productive cough.  She is given a prescription for doxycycline but  states she is no better.  She is using over-the-counter cough  suppressants and drinking fluids.  Allergy is VICODIN.  My response is  to change this to Biaxin 500 mg to take this b.i.d. for 10 days.  This  prescription was called into the [facility] on [address].     Tera Mater. Clent Ridges, MD  Electronically Signed    SAF/MedQ  DD: 05/02/2006  DT: 05/02/2006  Job #: [id number]

## 2010-08-23 NOTE — Discharge Summary (Signed)
[place]. [facility]  Patient:    [name], [name]                      MRN: [id number] Adm. Date:  [id number] Disc. Date: 09/19/00 Attending:  Mirian Mo Dictator:   Tereso Newcomer, P.A. CC:         [facility]   Discharge Summary  DATE OF BIRTH:  [birth year]  DISCHARGE DIAGNOSES: 1. Chest pain, etiology unknown, myocardial infarction ruled out this    admission. 2. Obesity. 3. Isolated elevated glucose level. 4. History of hyperlipidemia. 5. Hypertension. 6. Borderline hypokalemia. 7. Probable metabolic syndrome.  PROCEDURES:  Adenosine Cardiolite revealing EF of 64%.  No ischemia.  HISTORY OF PRESENT ILLNESS:  This 60 year old female with multiple cardiac risk factors including obesity, positive family history of CAD, hyperlipidemia, and hypertension was transferred from [facility] on September 17, 2000.  At 5:30 p.m. on September 17, 2000, she developed tightness in the center of her chest.  She became anxious and developed symptoms of a headache and hyperventilation with perioral numbness and tingling.  She finally presented to the emergency room at [facility], where her blood pressure was 200 systolic.  Initial enzymes were negative.  Blood work was normal except for a potassium of 3.5.  She had had a recent blood sugar drawn several hours after eating that was in the 120s.  Her EKG was normal, except for some poor R wave progression anteriorly, and portable chest x-ray showed some cardiomegaly, though it was portable.  Apparently, sublingual nitroglycerin relieved her pain.  She was transferred to [facility] for further evaluation.  PHYSICAL EXAMINATION:  VITAL SIGNS:  Initially here, her blood pressure was 140/90, pulse 72.  LUNGS:  Clear.  HEART:  Nondisplaced PMI with a normal S1, S2 without murmur, rub, gallop, or click.  NECK:  Without JVD.  ABDOMEN:  Protuberant with good  bowel sounds.  EXTREMITIES:  Without clubbing, cyanosis, or edema.  HOSPITAL COURSE:  She was admitted to [facility].  Due to her borderline hypokalemia with a potassium of 3.5, it was decided to decrease her hydrochlorothiazide to 12.5 mg a day, start her on potassium supplementation, and add Altace 5 mg a day for some potassium-sparing effects.  The patient had a hemoglobin A1c checked to rule out diabetes and this was normal at 5.3.  She had a second troponin drawn.  This was negative at 0.01.  Lipid profile returned abnormal with a total cholesterol of 236, triglycerides 564, HDL 41, LDL not calculated.  On September 18, 2000, she went for gaited exercise treadmill Cardiolite.  This was changed to adenosine secondary to poor exercise tolerance.  The Cardiolite scans were negative for ischemia with an EF of 64%. On the morning of September 19, 2000, she was found to be in stable condition and ready for discharge to home.  Dr. Andee Lineman saw the patient and decided to continue her on Protonix for questionable symptoms of GERD.  At discharge, the patients abnormal lipid profile was noted and she was placed on Niaspan 500 mg q.h.s.  LABORATORY DATA:  Labs performed at [facility]:  Troponin-I 0.01. Lipid profile as noted above.  Thyroid stimulating hormone 1.847.  At [facility]:  White blood cell count 7700, hemoglobin 13.9, hematocrit 39.5, platelet count 340,000.  Glucose 128, BUN 12, creatinine 0.9, calcium 9.3, sodium 137, potassium 3.5, chloride 106, CO2 25, magnesium 1.7.  Total CK 103, CK-MB 2.6, troponin-I less than 0.01.  INR 1.0.  DISCHARGE MEDICATIONS: 1. HCTZ 25 mg 1/2 tablet q.d. 2. Altace 5 mg q.d. 3. Protonix 40 mg q.d. 4. K-Dur 10 mEq q.d. 5. Niaspan 500 mg q.h.s. 6. Aspirin 81 mg a day.  ACTIVITY:  As tolerated.  DIET:  Low-fat/low-sodium diet.  FOLLOW-UP:  She is to follow up with Ms. Renette Butters, the physician assistant with Dr. Arlyce Dice next week, early  in the week, and she should have a BMP checked at that time to follow up on her potassium.  She will need a lipid profile checked in about six weeks.  Consider the addition of a statin if her lipid profile is not improved. DD:  09/19/00 TD:  09/19/00 Job: [id number]

## 2010-08-23 NOTE — H&P (Signed)
[place]. [facility]  Patient:    [name], [name]                     MRN: [id number] Adm. Date:  09/18/00 Attending:  Jesse Sans. Wall, M.D. [facility] CC:         Ms. Renette Butters P.A.-C. at [facility] on [facility] [address] with Dr. Nita Sells group                         History and Physical  CHIEF COMPLAINT:  "Chest tightness, shortness of breath, headache, and numbness around my face."  HISTORY OF PRESENT ILLNESS:  [name] is a very pleasant 60 year old married white female who has multiple cardiac risk factors including obesity, positive family history of coronary disease in her mother, hyperlipidemia, and hypertension.  She may be glucose intolerant based on the blood sugar that was drawn several hours after eating that was in the 120s.  At 5:30 p.m., after getting off work from [employer] in [place], she developed some tightness in the center of her chest.  She became very anxious and then developed symptoms of a headache and hyperventilation with perioral numbness and tingling.  She presented to the emergency room at [facility] where her blood pressure was 200 systolic.  Her initial enzymes were negative.  Blood work was normal except for potassium of 3.5.  EKG was normal except for some poor R wave progression anteriorly.  Portable chest x-ray showed some cardiomegaly though it was portable.  Sublingual nitroglycerin relieved her pain per Dr. Florian Buff; however, it made her nausea and her headache worse and dropped her systolic pressure down in the 63K.  She was transferred for further evaluation and treatment.  She has had no symptoms or exertional angina or ischemia.  She has had no previous cardiac history.  She has had no previous stroke.  PAST MEDICAL HISTORY:  Significant for hypertension.  She has been on HCTZ 25 mg a day but was recently started on Tiazac which she took this morning. She does not smoke or  drink.  She weighs about 220 pounds and weighed much less than this in high school.  She has no symptoms of diabetes though diabetes type 2 runs in her family.  FAMILY HISTORY:  Her mother had an MI and angina in her early 80s.  ALLERGIES:  She is intolerant of CODEINE, VICODIN, and PENICILLIN.  CURRENT MEDICATIONS:   Include hydrochlorothiazide 25 mg a day.  REVIEW OF SYSTEMS:  Remarkable for history of significant reflux symptoms. She has some problems recently with increased acid indigestion as well as chest wall burning.  It is also associated with nausea.  She denies any dysphagia.  She has had no hematochezia or hematemesis or melena.  She has had a history of a peptic ulcer diagnosed by upper GI years ago.  She denies any cough or hemoptysis.  She has had no abdominal pain.  She has had no change in bowel habits.  She denies any symptoms of claudication.  SOCIAL HISTORY:  She works at [employer] in [employer].  She has two stepchildren.  She is married.  PHYSICAL EXAMINATION:  GENERAL:  She is very pleasant.  Skin is warm and dry.  She is currently in no acute distress.  VITAL SIGNS:  Blood pressure 140/90, pulse 72 and regular, O2 saturation 96-98% on 2 liters.  NECK:  She has no JVD.  Carotid upstrokes are equal bilaterally without bruits.  No thyromegaly.  Trachea is midline.  LUNGS:  Clear to auscultation.  HEART:  Reveals a non-displaced PMI with a normal S1 and S2 without murmur, rub, gallop, or click.  ABDOMEN:  Protuberant with good bowel sounds.  EXTREMITIES:  No cyanosis, clubbing, or edema.  Pulses are present.  NEUROLOGIC:  Grossly intact.  ASSESSMENT: 1. Probable noncardiac chest pain.  I suspect that her symptoms are a    combination of the effects of unhealthy air, a so-called "red alert day,"    symptoms of gastroesophageal reflux, and probable hyperventilation with    anxiety.  With her cardiac risk factors we need to rule out  coronary    ischemia and objectively assess such. 2. Obesity. 3. Probable glucose intolerance. 4. Hyperlipidemia. 5. Hypertension. 6. Borderline hypokalemia, probably from hydrochlorothiazide. 7. Probable metabolic syndrome.  PLAN: 1. Check troponin.  If it is negative, GXT Cardiolite today if can schedule. 2. Fasting blood sugar. 3. TSH. 4. Fasting lipids. 5. Add ACE inhibitor to her HCTZ. 6. Increase potassium.  I would decrease her HCTZ to 12.5 a day for just blood    pressure effect and to decrease the effects of hypokalemia.  ACE    inhibitor will provide some potassium sparing. 7. Significant effort towards weight reduction to reverse the metabolic    syndrome.  I had a long talk with her and her husband concerning this. DD:  09/18/00 TD:  09/18/00 Job: [id number]

## 2010-08-23 NOTE — Discharge Summary (Signed)
NAME:  Jeanette, [name]                      ACCOUNT NO.:  [id number]   MEDICAL RECORD NO.:  [id number]                   PATIENT TYPE:  INP   LOCATION:  0362                                 FACILITY:  [facility]   PHYSICIAN:  Corwin Levins, M.D. [facility]             DATE OF BIRTH:  05-23-1950   DATE OF ADMISSION:  01/21/2002  DATE OF DISCHARGE:  01/23/2002                                 DISCHARGE SUMMARY   DISCHARGE DIAGNOSES:  1. Chest pain, felt noncardiac.  2. Hypertension.  3. Hypokalemia.  4. Hypercholesterolemia/metabolic syndrome.  5. Glucose intolerance.  6. Morbid obesity.  7. Anxiety disorder/depression with hyperventilation.  8. Status post bladder surgery.   CONSULTATIONS:  1. Cardiology, Dr. Daleen Squibb.  2. [facility].   PROCEDURES:  None.   HOSPITAL COURSE:  The patient is a 60 year old white female admitted with  chest discomfort, pain in the hands, and anxiety with hyperventilation.  She  was found to have slightly low potassium, which was felt may have helped  with the paresthesias.  She was not anemic.  White blood cell count was  normal.  CPK-MB fractions were performed x 3, which were within normal  limits.  Troponin I performed x 2 were within normal limits.  A C-reactive  protein also checked, 8.6, which is thought to be high risk for inflammation  and ongoing cardiovascular problems.  Hemoglobin A1C was 5.8, after initial  glucose was 173 in the emergency room.  Serum magnesium was 1.8.  Lipid  profile showed total cholesterol 233, triglycerides 204, HDL cholesterol 48,  LDL cholesterol.  Cardiology was consulted.  Chest pain was thought not to  be cardiac and, with negative workup June 2002, it was felt with negative  enzymes she warranted no further evaluation at this time.  It was felt that  she needed aggressive risk-factor modification, however, including that  related to her metabolic syndrome.  Mavik was started, as well as Lipitor,  as well as  potassium supplement with her slightly low potassium.  There was  some consideration regarding her ______ and glucose intolerance with insulin  resistance, but this was not actually started.  She was advised to increase  activity, decrease calories to effect weight loss to help with the insulin-  resistant syndrome.  As she had ruled out for cardiac problems and was  stable otherwise clinically, she was felt to have gained maximum benefit  from this hospitalization and is to be discharged home.   DISPOSITION:  Discharged home in good condition.   FOLLOW-UP:  She will follow up with Dr. Renne Crigler with a planned appointment  January 31, 2002.   DISCHARGE INSTRUCTIONS:  There are no specific activity or dietary  restrictions.   DISCHARGE MEDICATIONS:  1. HCTZ 12.5 mg p.o. q.d.  2. Lexapro increased dose to 20 mg p.o. q.d.  3. Mavik 2 mg p.o. q.d.  4. Klonopin 0.5 mg b.i.d. p.r.n.  5. Lipitor 10 mg p.o. q.d.  6. It should be noted she was treated with Protonix for two doses in the     hospital, but this was discontinued at the time of discharge.                                               Corwin Levins, M.D. [facility]    JWJ/MEDQ  D:  01/23/2002  T:  01/23/2002  Job:  [id number]

## 2010-08-23 NOTE — H&P (Signed)
NAME:  [name], [name]                      ACCOUNT NO.:  [id number]   MEDICAL RECORD NO.:  [id number]                   PATIENT TYPE:  EMS   LOCATION:  MAJO                                 FACILITY:  [facility]   PHYSICIAN:  Lonzo Cloud. Kriste Basque, M.D. [facility]            DATE OF BIRTH:  Dec 09, 1950   DATE OF ADMISSION:  08/19/2003  DATE OF DISCHARGE:                                HISTORY & PHYSICAL   HISTORY:  The patient is a 60 year old white female who presents with acute  back pain after a fall from a lawn mower today.  She states that while  mowing her yard on a riding [employer] [place] and going up a hill, the [employer]  flipped over on her and flipped twice.  She had severe back pain radiating  to both hips and her left leg.  She was helped back to the house and onto a  couch, but she could not get relief from her pain.  She was brought to the  emergency room and was unable to stand or walk.  It was very painful even to  log roll in the bed.  She was evaluated by Dr. Read Drivers and the ER staff.  X-  rays showed a question of a sacral fracture but no acute lumbosacral disease  evident.  They were unable to get an MRI in the emergency room and had quite  a bit of difficulty getting her pain controlled despite multiple doses of  Dilaudid.  She is being admitted for pain control, MRI scanning, and  orthopedic consultation.   PAST MEDICAL HISTORY:  1. History of chest pain in the past felt to be noncardiac.  She was     hospitalized in 2003 and had a negative adenosine Cardiolite at that     time.  2. Hypertension.  She has a history of mild to moderate hypertension,     currently controlled on diuretic with Diazide 1 tablet daily.  She has     had some hypokalemia in the past but is not currently on potassium     supplementation.  3. She has a history of hypercholesterolemia and was previous on Lipitor and     Niaspan.  She states that Dr. Clent Ridges recently diagnosed her with elevated     liver enzymes  of uncertain etiology.  She had negative hepatitis and HIV     screening in the office.  She is currently off statin therapy, I     believe because of this.  4. She has a history of obesity, glucose intolerance, and metabolic     syndrome.  She recently has had some elevated blood sugars in the 180     range and is being considered for possible Glucophage therapy when this     injury occurred.  5. She has a history of anxiety and depression in the past with what sounds     like panic disorder as  well.  She was on Lexapro previously.  She     currently takes Klonopin twice a day as needed.  6. Status post cholecystectomy in 1983.  7. History of gastroesophageal reflux disease for which she takes Protonix.     She has been set up to see Dr. Leone Payor as an outpatient.  8. Total abdominal hysterectomy in 1983.  9. Abdominal repair and pubovaginal sling for urinary stress incontinence     and constipation.  10.      History of herniated disk, possibly L4 area, with disk surgery by     Dr. Emilee Hero in 1997.  She apparently had good result from this     surgery.  11.      History of ruptured Achilles tendon in 1990s with surgery by Dr.     Chaney Malling. She switched orthopedic surgeons and has most recently been     seeing Dr. Shelle Iron.  He gave her some injections in her knees earlier this     year.  12.      History of superficial phlebitis with surgery by Dr. Jake Samples in     the past.   CURRENT MEDICATIONS:  1. Klonopin  mg p.o. b.i.d.  2. Protonix 40 mg p.o. daily.  3. Diazide 1 capsule daily.   ALLERGIES:  CODEINE, VICODIN, PENICILLI NI, and NITROGLYCERIN which caused  her blood pressure to drop.   FAMILY HISTORY:  Positive for coronary artery disease.  Mother had a heart  attack in the 1950.  Family history is also positive for hypertension and  diabetes.  Her father had lung cancer and had previously been exposed to  asbestos.   SOCIAL HISTORY:  The patient does not smoke or  drink.  She is employed by  [employer] in [place].   PHYSICAL EXAMINATION:  GENERAL:  Obese 60 year old white female in mild  distress secondary to low back pain.  VITAL SIGNS:  Blood pressure 120/90, pulse 84 per minute and regular,  respirations 20 per minute and not labored.  Temperature 98 degrees.  HEENT:  Unremarkable.  NECK:  No jugular venous distention,no carotid bruits, no thyromegaly or  lymphadenopathy.  CHEST:  Clear to percussion and auscultation.  CARDIAC:  Regular rhythm, normal S1 and S2 without murmurs, rubs, or gallops  heard.  ABDOMEN:  Some epigastric tenderness on palpation, no evidence of masses.  BACK:  Midline lumbar scar.  She is very tender bilaterally over the sacral  area.  Straight leg raising is positive.  Exam is very difficult due to  pain.  EXTREMITIES:  No cyanosis, clubbing, or edema.  NEUROLOGIC:  Exam is difficult due to her pain.  There is no Babinski sign.   IMPRESSION:  Acute trauma with low back pain after fall from a lawn mower.  Initial x-rays show question of a small fracture to the sacrum.  There does  not appear to be any acute lumbar disease.  She needs admission for pain  control, MRI, and orthopedic consult.  Then she will need physical therapy  and rehab.  We will check her blood pressure, sugars, and A1C while she is  here.                                                Lonzo Cloud. Kriste Basque, M.D. [facility]    SMN/MEDQ  D:  08/19/2003  T:  08/19/2003  Job:  [id number]   cc:   [name], M.D. [facility]

## 2010-08-23 NOTE — H&P (Signed)
NAME:  [name], PODOLL                      ACCOUNT NO.:  [id number]   MEDICAL RECORD NO.:  [id number]                   PATIENT TYPE:  INP   LOCATION:  0362                                 FACILITY:  [facility]   PHYSICIAN:  Titus Dubin. Alwyn Ren, M.D. [facility]         DATE OF BIRTH:  [birth year]   DATE OF ADMISSION:  01/22/2002  DATE OF DISCHARGE:                                HISTORY & PHYSICAL   HISTORY OF PRESENT ILLNESS:  The patient is a 60 year old white female  admitted to rule out impending infarction.   She ate dinner at approximately 7 p.m., and approximately 1-1/2 hours later  while in bed she began to have discomfort in her shoulders and tingling in  her tongue and cheek with a stinging sensation.  This was associated with  nausea and dizziness, and chest compression as if someone wrapped my chest  with an Ace bandage.  She also had associated flushing.  She took Advil  with suboptimal response prompting her ER visit.  She also had the sensation  that she needed to burp.   PAST MEDICAL HISTORY:  1. Hospitalization at [facility] in the summer of 2002 with     profound hypertension and hypokalemia.  2. In 1983, she had cholecystectomy and total abdominal hysterectomy.  3. In 1997, she had a ruptured disk.  She also had a ruptured Achilles     tendon.  4. In 4/03, she had a sling procedure for bladder drop.  5. She apparently also has had some peripheral vascular surgery for     superficial phlebitis by Dr. Liliane Bade.   MEDICATIONS:  1. Hydrochlorothiazide 25 mg 1/2 q.d.  2. Lexapro 10 mg q.d.   ALLERGIES:  VICODIN.  NITROGLYCERIN apparently caused profound hypotension.  She was told by [facility] to never take it again.   SOCIAL HISTORY:  She does not drink or smoke.   FAMILY HISTORY:  Positive for coronary artery disease, diabetes, and  hypertension in maternal family.  Her father had cancer of the lung, and was  exposed to asbestos vocationally.   REVIEW OF SYSTEMS:  Increased stress recently for which Lexapro was  prescribed.  She denies dyspepsia, dysphagia, rectal bleeding, or melena.  She has had no change in her weight.  She does have shortness of breath at  work with minor exertion.  She is deconditioned, does not exercise.  She is  on no specific diet.  She has had no genitourinary or gynecologic symptoms.   PHYSICAL EXAMINATION:  GENERAL:  At this time she appears somewhat anxious,  but in no acute distress.  She is deconditioned and obviously overweight.  VITAL SIGNS:  She has a normal sinus rhythm of 96 on telemetry.  Respiratory  rate is 20 to 24.  Blood pressure is 141/63.  HEENT:  __________ is noted.  Dental hygiene is fair to good.  Otolaryngologic examination is otherwise unremarkable.  NECK:  No  lymphadenopathy of the head, neck, or axilla.  Thyroid is normal  to palpation.  She has no carotid bruits.  HEART:  There is a grade 1 systolic murmur loudest at the right base.  LUNGS:  Breath sounds are clear.  ABDOMEN:  Protuberant and nontender.  Bowel sounds are present.  She has a  right upper quadrant operative scar.  There is a lumbosacral operative scar  which is well-healed.  EXTREMITIES:  Homan's signs is negative.  Pedal pulses are intact.  NEUROLOGIC:  There are no significant neurologic deficits.  She is obviously  anxious, but oriented x3.   LABORATORY DATA:  EKG is normal.  Chest x-ray shows scattered markings in  the right lower lobe medially.   PLAN:  1. She will be admitted for observation to rule out myocardial infarction.     Her intolerance to nitroglycerin is noted.  2. She is under increased familial stress.  3. She will be on telemetry with cardiac enzymes.  She will receive     supplemental oxygen and IV morphine as needed.  4. Cardiac risk factors will be assessed with hemoglobin A1C, C-reactive     protein, high sensitivity, homocystine level, and fasting lipids.  She     will be placed on  a proton pump inhibitor empirically, and stool cards     monitored.  5. Attempts will be made to obtain the records from [facility] by fax.                                               Titus Dubin. Alwyn Ren, M.D. [facility]    WFH/MEDQ  D:  01/22/2002  T:  01/22/2002  Job:  [id number]   cc:   [name], M.D. [facility]   Maisie Fus C. Wall, M.D. [facility]

## 2010-08-23 NOTE — Discharge Summary (Signed)
NAME:  [name], [name]                      ACCOUNT NO.:  [id number]   MEDICAL RECORD NO.:  [id number]                   PATIENT TYPE:  INP   LOCATION:  5709                                 FACILITY:  [facility]   PHYSICIAN:  Valetta Mole. Swords, M.D. [facility]           DATE OF BIRTH:  1950/10/18   DATE OF ADMISSION:  08/19/2003  DATE OF DISCHARGE:                                 DISCHARGE SUMMARY   DISCHARGE DIAGNOSES:  1. Sacral fracture.  2. History of hypertension.  3. History of hyperlipidemia (currently not on therapy).  4. History of hyperglycemia currently not on medication.  5. History of anxiety and depression.  6. History of gastroesophageal reflux disease.  7. Status post total abdominal hysterectomy.  8. History of herniated disk (surgery in 1997).  9. History of ruptured Achilles tendon.  10.      Adrenal lesion measuring 4 x 3 x 4 cm (recommendations for follow-     up CAT scan).   DISCHARGE MEDICATIONS:  1. Protonix 40 mg p.o. daily.  2. Diazide one tablet daily.  3. Celebrex 200 mg p.o. b.i.d.  4. Klonopin which she will take at her usual home dosage.  5. Ultracet one to two tablets p.o. q.i.d. p.r.n.   CONSULTATIONS:  Orthopedics.  The patient was seen on Aug 21, 2003 with  recommendations for a steroid taper and non-steroidal anti-inflammatory  medications.   FOLLOW UP:  1. Follow-up with Dr. Clent Ridges this week.  2. She will need a follow-up CT scan of her adrenal gland.  The     recommendation was for a targeted CT of the adrenal with and without     contrast.   PROCEDURES:  1. CT of the spine performed on Aug 19, 2003 demonstrated an S3 vertebral     fracture. Also noted are degenerative changes in the lumbar spine.  There     is multifactorial spinal stenosis at L2-3, L3-4 and L4-5.  There is also     disk space narrowing noted at L5-S1.  2. MRI of the lumbar spine performed on Aug 20, 2003 demonstrated a buckle     fracture of S3.  Throughout the lumbar spine  there is moderate disk     degeneration, multiple broad disk protrusions and there are several areas     of foraminal narrowing without nerve root compression.  3. Ultrasound of the abdomen was performed on Aug 21, 2003 demonstrating     absence of the gallbladder, common bile duct 7.7 mm (could be normal for     patient status post cholecystectomy).   DISCHARGE CONDITION:  Improved, walking without significant pain but using a  walker.   HOSPITAL COURSE:  The patient was admitted to the Hospitalist service on Aug 19, 2003 by Dr. Kriste Basque.  See his note for details.  The patient is status  post a fall and had been diagnosed with an S3 fracture.  The patient  was  slow to respond, but finally did respond to a combination of a prednisone  burst, Celebrex and Ultracet.  Note the patient previously had trouble with  Vicodin.   The patient has a history of hyperglycemia.  This was followed closely while  the patient was in the hospital and on prednisone.  The patient will not be  discharged on prednisone.  She will need follow-up of her hyperglycemia as  an outpatient.   Anxiety - the patient has a chronic anxiety disorder.  She used Valium in  the hospital.  She will be discharged on her usual dose of Klonopin.   Gastroesophageal reflux disease was well controlled on current medications.   Her hypertension is currently controlled on Dyazide.   DISPOSITION:  The patient is eager to go home and feels like she will do  well at home. Her husband stays with her.                                                Bruce Rexene Edison Swords, M.D. [facility]    BHS/MEDQ  D:  08/26/2003  T:  08/26/2003  Job:  [id number]   cc:   [name], M.D. [facility]

## 2010-08-23 NOTE — Op Note (Signed)
Jeanette, [name]               ACCOUNT NO.:  [id number]   MEDICAL RECORD NO.:  [id number]          PATIENT TYPE:  AMB   LOCATION:  DSC                          FACILITY:  [facility]   PHYSICIAN:  Thornton Park. Daphine Deutscher, MD  DATE OF BIRTH:  07-Nov-1950   DATE OF PROCEDURE:  05/13/2005  DATE OF DISCHARGE:                                 OPERATIVE REPORT   PREOPERATIVE DIAGNOSIS:  Right nipple discharge.   POSTOPERATIVE DIAGNOSIS:  Right nipple discharge.   PROCEDURE:  Cannulation of right mammary duct and excision of duct.   SURGEON:  Thornton Park. Daphine Deutscher, MD   PREOPERATIVE INDICATIONS:  [name] was seen by me after having [facility]  [facility]  This was for what had been described  was a bloody nipple discharge and it was suspicious possibly for a  papilloma.  The patient was seen in the office and ductogram was reviewed.  She had had negative mammogram.  At the time of my visit in the office, I  could not express any discharge from her nipple, despite vigorous  manipulation.   DESCRIPTION OF PROCEDURE:  [name] was brought in today and she had  received informed consent regarding the limitations of this operation and  she was taken back to OR-4 on May 13, 2005 and given general by LMA.  The right breast was prepped with Betadine and draped sterilely.  Under  vigorous manipulation, I was still unable to get anything to come out of her  ducts.  Based on the report and my knowledge of the ductogram, I went ahead  and made an infra-areolar incision, looking predominantly at the 6 o'clock  position on the right nipple and went centrally to beneath the areolar  complex and then compressed this area and then got a discharge in the area  that was described.  This discharge was more of a pasty kind of discharge  possibly consistent with contrast material.  I went ahead and passed a duct  probe into that and then used as a guide to began the dissection in  the  retroareolar region.  I got around that duct and grasped it and then  dissected this down into the depths of the breast about 4 cm, removing all  the periductal tissue.  I concentrated in the direction of the 6 o'clock  position and finally excised an area that I marked with a stitch that was  lumpy in an area of a dilated duct and seemed to filled with material and I  felt this was likely the papilloma.  There were no other palpable lesions  anywhere within the breast that were either suspicious for carcinoma or had  any bloody drainage or even any more discharge noted.  I carefully examined  the base of the incision, compressing it, looking for evidence of any  further ductal drainage and none was seen.  I irrigated.  No bleeding was  noted.  I irrigated with 0.5% Marcaine.  I approximated the depths of the  breast tissue  with 4-0 Vicryl and then closed the  wound with interrupted 4-0 Vicryl  subcutaneously and subcuticularly with Benzoin and Steri-Strips.  The  patient will be given Tylox for pain and will be followed up in the office  in approximately 2-3 weeks.      Thornton Park Daphine Deutscher, MD  Electronically Signed     MBM/MEDQ  D:  05/13/2005  T:  05/14/2005  Job:  [id number]   cc:   Tera Mater. Clent Ridges, M.D. [facility]  [address] [place]  [place] [address]

## 2010-08-23 NOTE — Consult Note (Signed)
NAMEROBBYN, HODKINSON NO.:  [id number]   MEDICAL RECORD NO.:  [id number]          PATIENT TYPE:  EMS   LOCATION:  MAJO                         FACILITY:  [facility]   PHYSICIAN:  Valetta Mole. Swords, MD    DATE OF BIRTH:  09-24-1950   DATE OF CONSULTATION:  04/29/2006  DATE OF DISCHARGE:                                 CONSULTATION   REQUESTING PHYSICIAN:  Markham Jordan L. Effie Shy, M.D.   CHIEF COMPLAINT:  Coughing attack.   HISTORY OF PRESENT ILLNESS:  [name] is a 60 year old who has a 3-  day history of illness.  [name] was brought in today because [name] was  concerned with coughing attacks.  [name] had several episodes of emesis  approximately 3 days ago lasting 4-5 hours and resolved spontaneously.  [name] was feeling well but approximately 12 hours later [name] developed a  dry hacking cough.  [name] now describes of paroxysms of cough associated  with chest discomfort.  During the paroxysms, [name] is unable to catch her  breath and feels short of breath.  While at rest [name] has no shortness of  breath and [name] has not noticed any shortness of breath with exertion or  any chest pain with exertion or at rest.  The only time that her chest  hurts is with coughing.  [name] denies any wheeze, PND, or orthopnea.  [name]  denies any other associated symptoms, or any other modifying factors.  Severity of chest pain is 6/10 when [name] is coughing.   [name] does admit to minimal diarrhea this morning.  [name] has had two  relatively loose bowel movements (not watery).   PAST MEDICAL HISTORY SIGNIFICANT FOR:  1. Hypertension.  2. Hyperlipidemia.  3. Elevated blood sugar.  4. Gastroesophageal reflux disease,  5. Total abdominal hysterectomy, back surgery for a herniated disk.  6. [name] has had a ruptured Achilles tendon and [name] has been evaluated,      in the past, for an adrenal lesions.   MEDICATIONS INCLUDE:  1. Hydrochlorothiazide 25 mg p.o. daily.  2. Cymbalta 90 mg p.o. daily.  3. Klonopin p.r.n.   ALLERGIES:  [name] is allergic to NITROGLYCERIN (causes me to bottom  out).   SOCIAL HISTORY:  Son is deceased secondary to murder.  [name] lives alone.  [name] does not smoke, does not drink alcohol.   FAMILY HISTORY:  Mother alive with coronary artery disease.  Father is  deceased.   REVIEW OF SYSTEMS:  [name] admits to anxiety and depression since her son's  death.  Other complaints are listed above.  [name] denies any other  complaints except for those in the HPI.   PHYSICAL EXAMINATION:  VITAL SIGNS:  Temperature up to 100.4, heart rate  99, blood pressure 106/52, respirations 22.  GENERAL:  [name] appears as an obese female in no acute distress.  HEENT:  Atraumatic, normocephalic.  Extraocular muscles are intact.  NECK:  Supple without lymphadenopathy, thyromegaly, jugular venous  distention, or carotid bruits.  CHEST:  Clear to auscultation without any increased work of breathing.  No rhonchi or crackles are identified.  CARDIAC  EXAM:  S1-S2 are normal without murmur or gallop.  ABDOMINAL EXAM:  Overweight, active bowel sounds, soft and nontender.  There is no hepatosplenomegaly and no masses are palpated.  EXTREMITIES:  There is no clubbing, cyanosis, or edema.  NEUROLOGIC:  Patient is alert and oriented without any sensory deficits.  [name] is able to ambulate without difficulty.   EKG:  Demonstrates sinus tachycardia at 119 beats per minute; otherwise  unremarkable.  [name] does have small R wave in lead III, and a small Q  wave in lead aVF. This is somewhat different than EKG done on  08/20/2003.   LABORATORIES:  Glucose 139.  CBC is normal.  Chest x-ray demonstrates  bronchitic changes.   ASSESSMENT AND PLAN:  Acute illness, most likely viral, but [name] does  have a fever and bronchitic changes on an x-ray.  Will treat with  bronchitis.  [name] is acutely concerned with the finances of medications.  We will call in doxycycline 100 mg b.i.d. and also __________  as a  cough medicine.  Her chest  discomfort is clearly related to her cough.  [name] has no other chest discomfort.  [name] has no shortness of breath  except when [name] is in the midst of a coughing paroxysm.  I do not think  that this is an acute problem.  I have asked her to followup with Dr.  Clent Ridges within the next week.  Her EKG has changed in the last 2 years, but  there is nothing acute on the EKG that would suggest that [name] needs to  stay in the hospital.  [name] is fully aware of this.  [name] understands the  plan and is eager to go home.      Bruce Rexene Edison Swords, MD  Electronically Signed     BHS/MEDQ  D:  04/29/2006  T:  04/29/2006  Job:  [id number]

## 2010-08-23 NOTE — Op Note (Signed)
[facility]  Patient:    [name], [name] Visit Number: [id number] MRN: [id number]          Service Type: SUR Location: 3W [id number] [id number] Attending Physician:  Thermon Leyland Dictated by:   Marcelle Overlie, M.D. Proc. Date: 06/21/01 Admit Date:  06/21/2001                             Operative Report  PREOPERATIVE DIAGNOSES: 1. Cystocele grade 3. 2. Rectocele grade 2. 3. Genuine stress urinary incontinence.  POSTOPERATIVE DIAGNOSES: 1. Cystocele grade 3. 2. Rectocele grade 2. 3. Genuine stress urinary incontinence.  PROCEDURE:  A&P repair performed by Dr. Vincente Poli and then a pubovaginal sling.  SURGEON:  Marcelle Overlie, M.D., and Dr. Isabel Caprice performed pubovaginal sling with a suprapubic catheter placement and cystoscopy.  ESTIMATED BLOOD LOSS:  About 100.  DESCRIPTION OF PROCEDURE:  The patient was taken to the operating room.  She was intubated without difficulty.  She was then placed in the lithotomy position and reverse Trendelenburg.  The abdomen, vagina, and vulva were prepped and draped in the usual sterile fashion.  A Foley catheter was inserted into the bladder.  Exam revealed a grade 3 cystocele and a grade 2 rectocele.  She had good vaginal vault support.  There were no pelvic masses noted.  We initially started with anterior repair where Allis clamps were placed at the vaginal apex on either side at 3 and 9 oclock.  A transverse incision was made with the scalpel, and the overlying vaginal epithelium was undermined.  A midline incision was made from the vaginal apex up to the urethrovesical angle.  The overlying vaginal epithelium was then dissected free of the bladder.  The bladder was then reduced using a pursestring suture x 3, using 2-0 Vicryl suture.  The redundant vaginal epithelium was then closed using Metzenbaum scissors.  We then left that open at this point so Dr. Isabel Caprice could perform his sling, and then attention was  then turned posteriorly and to the posterior repair where a scalpel was used to make a V-shaped incision at the perineum, and the midline incision was made into the posterior vaginal epithelium from the introitus to just above the rectocele. The overlying rectovaginal fascia was then dissected free from the vaginal epithelium using Metzenbaum scissors.  The rectovaginal fascia was reapproximated in the midline using a series of interrupteds using 2-0 Vicryl suture, and the redundant vaginal epithelium was then excised.  The vagina was then closed in a continuous running stitch using 2-0 Vicryl suture.  The surgery was then given to Dr. Isabel Caprice where he proceeded with his pubovaginal sling and suprapubic catheter placement and cystoscopy.  This portion of the surgery, EBL was approximately 100 cc. Dictated by:   Marcelle Overlie, M.D. Attending Physician:  Thermon Leyland DD:  06/21/01 TD:  06/21/01 Job: [id number]

## 2010-12-26 LAB — I-STAT 8, (EC8 V) (CONVERTED LAB)
Acid-Base Excess: 3 — ABNORMAL HIGH
BUN: 15
Bicarbonate: 27.3 — ABNORMAL HIGH
Chloride: 105
Glucose, Bld: 125 — ABNORMAL HIGH
HCT: 42
Hemoglobin: 14.3
Operator id: 198171
Potassium: 3.3 — ABNORMAL LOW
Sodium: 139
TCO2: 28
pCO2, Ven: 40.5 — ABNORMAL LOW
pH, Ven: 7.436 — ABNORMAL HIGH

## 2011-01-01 LAB — URINE MICROSCOPIC-ADD ON

## 2011-01-01 LAB — URINE CULTURE: Colony Count: 25000

## 2011-01-01 LAB — DIFFERENTIAL
Basophils Absolute: 0
Basophils Relative: 0
Eosinophils Absolute: 0.1
Eosinophils Relative: 2
Lymphocytes Relative: 20
Lymphs Abs: 1.5
Monocytes Absolute: 0.5
Monocytes Relative: 6
Neutro Abs: 5.5
Neutrophils Relative %: 72

## 2011-01-01 LAB — URINALYSIS, ROUTINE W REFLEX MICROSCOPIC
Glucose, UA: 100 — AB
Ketones, ur: 15 — AB
Nitrite: POSITIVE — AB
Protein, ur: >300 — AB
Specific Gravity, Urine: 1.025
Urobilinogen, UA: 4 — ABNORMAL HIGH
pH: 6.5

## 2011-01-01 LAB — COMPREHENSIVE METABOLIC PANEL
ALT: 22
AST: 26
Albumin: 3.9
Alkaline Phosphatase: 63
BUN: 13
CO2: 27
Calcium: 9.3
Chloride: 102
Creatinine, Ser: 0.87
GFR calc Af Amer: >60
GFR calc non Af Amer: >60
Glucose, Bld: 101 — ABNORMAL HIGH
Potassium: 3.8
Sodium: 138
Total Bilirubin: 1.3 — ABNORMAL HIGH
Total Protein: 7

## 2011-01-01 LAB — CBC
HCT: 47.2 — ABNORMAL HIGH
Hemoglobin: 16.2 — ABNORMAL HIGH
MCHC: 34.3
MCV: 94.2
Platelets: 222
RBC: 5.01
RDW: 11.9
WBC: 7.6

## 2011-01-01 LAB — POCT CARDIAC MARKERS
CKMB, poc: <1 — ABNORMAL LOW
Myoglobin, poc: 52.8
Operator id: 294501
Troponin i, poc: <0.05

## 2011-01-01 LAB — PREGNANCY, URINE: Preg Test, Ur: NEGATIVE

## 2011-01-02 LAB — URINALYSIS, ROUTINE W REFLEX MICROSCOPIC
Bilirubin Urine: NEGATIVE
Glucose, UA: NEGATIVE
Ketones, ur: NEGATIVE
Nitrite: NEGATIVE
Protein, ur: 30 — AB
Specific Gravity, Urine: 1.023
Urobilinogen, UA: 1
pH: 6.5

## 2011-01-02 LAB — WET PREP, GENITAL
Trich, Wet Prep: NONE SEEN
Yeast Wet Prep HPF POC: NONE SEEN

## 2011-01-02 LAB — URINE CULTURE
Colony Count: NO GROWTH
Culture: NO GROWTH

## 2011-01-02 LAB — DIFFERENTIAL
Basophils Absolute: 0.1
Basophils Relative: 1
Eosinophils Absolute: 0.1
Eosinophils Relative: 3
Lymphocytes Relative: 49 — ABNORMAL HIGH
Lymphs Abs: 2.2
Monocytes Absolute: 0.5
Monocytes Relative: 11
Neutro Abs: 1.6 — ABNORMAL LOW
Neutrophils Relative %: 37 — ABNORMAL LOW

## 2011-01-02 LAB — CBC
HCT: 37.7
Hemoglobin: 13.4
MCHC: 35.5
MCV: 95.1
Platelets: 247
RBC: 3.96
RDW: 12.2
WBC: 4.5

## 2011-01-02 LAB — URINE MICROSCOPIC-ADD ON

## 2011-01-02 LAB — GC/CHLAMYDIA PROBE AMP, GENITAL
Chlamydia, DNA Probe: NEGATIVE
GC Probe Amp, Genital: NEGATIVE

## 2011-01-03 LAB — POCT URINALYSIS DIP (DEVICE)
Bilirubin Urine: NEGATIVE
Glucose, UA: NEGATIVE
Hgb urine dipstick: NEGATIVE
Ketones, ur: NEGATIVE
Nitrite: NEGATIVE
Operator id: 148111
Protein, ur: NEGATIVE
Specific Gravity, Urine: 1.02
Urobilinogen, UA: 0.2
pH: 6

## 2011-01-06 LAB — URINALYSIS, ROUTINE W REFLEX MICROSCOPIC
Bilirubin Urine: NEGATIVE
Glucose, UA: NEGATIVE
Ketones, ur: NEGATIVE
Leukocytes, UA: NEGATIVE
Nitrite: NEGATIVE
Protein, ur: NEGATIVE
Specific Gravity, Urine: <1.005 — ABNORMAL LOW
Urobilinogen, UA: 0.2
pH: 7

## 2011-01-06 LAB — URINE MICROSCOPIC-ADD ON

## 2011-01-08 LAB — POCT RAPID STREP A: Streptococcus, Group A Screen (Direct): NEGATIVE

## 2011-01-09 LAB — COMPREHENSIVE METABOLIC PANEL
ALT: 23 U/L (ref 0–35)
AST: 17 U/L (ref 0–37)
Albumin: 4.1 g/dL (ref 3.5–5.2)
Alkaline Phosphatase: 53 U/L (ref 39–117)
BUN: 16 mg/dL (ref 6–23)
CO2: 28 mEq/L (ref 19–32)
Calcium: 9.6 mg/dL (ref 8.4–10.5)
Chloride: 106 mEq/L (ref 96–112)
Creatinine, Ser: 0.75 mg/dL (ref 0.4–1.2)
GFR calc Af Amer: >60 mL/min (ref >60)
GFR calc non Af Amer: >60 mL/min (ref >60)
Glucose, Bld: 98 mg/dL (ref 70–99)
Potassium: 4.1 mEq/L (ref 3.5–5.1)
Sodium: 140 mEq/L (ref 135–145)
Total Bilirubin: 0.9 mg/dL (ref 0.3–1.2)
Total Protein: 6.9 g/dL (ref 6.0–8.3)

## 2011-01-09 LAB — URINE MICROSCOPIC-ADD ON

## 2011-01-09 LAB — DIFFERENTIAL
Basophils Absolute: 0 10*3/uL (ref 0.0–0.1)
Basophils Relative: 1 % (ref 0–1)
Eosinophils Absolute: 0.1 10*3/uL (ref 0.0–0.7)
Eosinophils Relative: 1 % (ref 0–5)
Lymphocytes Relative: 31 % (ref 12–46)
Lymphs Abs: 2 10*3/uL (ref 0.7–4.0)
Monocytes Absolute: 0.5 10*3/uL (ref 0.1–1.0)
Monocytes Relative: 8 % (ref 3–12)
Neutro Abs: 3.9 10*3/uL (ref 1.7–7.7)
Neutrophils Relative %: 59 % (ref 43–77)

## 2011-01-09 LAB — URINE CULTURE: Colony Count: >=100000

## 2011-01-09 LAB — BASIC METABOLIC PANEL
BUN: 10 mg/dL (ref 6–23)
CO2: 26 mEq/L (ref 19–32)
Calcium: 9 mg/dL (ref 8.4–10.5)
Chloride: 101 mEq/L (ref 96–112)
Creatinine, Ser: 0.94 mg/dL (ref 0.4–1.2)
GFR calc Af Amer: >60 mL/min (ref >60)
GFR calc non Af Amer: >60 mL/min (ref >60)
Glucose, Bld: 82 mg/dL (ref 70–99)
Potassium: 3.7 mEq/L (ref 3.5–5.1)
Sodium: 136 mEq/L (ref 135–145)

## 2011-01-09 LAB — CBC
HCT: 39.5 % (ref 36.0–46.0)
Hemoglobin: 13.7 g/dL (ref 12.0–15.0)
MCHC: 34.7 g/dL (ref 30.0–36.0)
MCV: 95.6 fL (ref 78.0–100.0)
Platelets: 237 10*3/uL (ref 150–400)
RBC: 4.13 MIL/uL (ref 3.87–5.11)
RDW: 12.5 % (ref 11.5–15.5)
WBC: 6.5 10*3/uL (ref 4.0–10.5)

## 2011-01-09 LAB — URINALYSIS, ROUTINE W REFLEX MICROSCOPIC
Bilirubin Urine: NEGATIVE
Glucose, UA: NEGATIVE mg/dL
Ketones, ur: NEGATIVE mg/dL
Nitrite: NEGATIVE
Protein, ur: NEGATIVE mg/dL
Specific Gravity, Urine: 1.016 (ref 1.005–1.030)
Urobilinogen, UA: 0.2 mg/dL (ref 0.0–1.0)
pH: 5.5 (ref 5.0–8.0)

## 2011-01-09 LAB — HEMOGLOBIN A1C
Hgb A1c MFr Bld: 5.6 % (ref 4.6–6.1)
Mean Plasma Glucose: 114 mg/dL

## 2011-01-09 LAB — HEMATOCRIT: HCT: 35.4 % — ABNORMAL LOW (ref 36.0–46.0)

## 2011-01-09 LAB — TSH: TSH: 1.574 u[IU]/mL (ref 0.350–4.500)

## 2011-01-09 LAB — LIPASE, BLOOD: Lipase: 38 U/L (ref 11–59)

## 2011-01-14 LAB — I-STAT 8, (EC8 V) (CONVERTED LAB)
Acid-Base Excess: 1
BUN: 13
Bicarbonate: 22.7
Chloride: 110
Glucose, Bld: 120 — ABNORMAL HIGH
HCT: 38
Hemoglobin: 12.9
Operator id: 192351
Potassium: 3.7
Sodium: 144
TCO2: 24
pCO2, Ven: 28.9 — ABNORMAL LOW
pH, Ven: 7.504 — ABNORMAL HIGH

## 2011-01-14 LAB — RAPID URINE DRUG SCREEN, HOSP PERFORMED
Amphetamines: NOT DETECTED
Barbiturates: NOT DETECTED
Benzodiazepines: NOT DETECTED
Cocaine: NOT DETECTED
Opiates: NOT DETECTED
Tetrahydrocannabinol: NOT DETECTED

## 2011-01-14 LAB — CBC
HCT: 37.8
Hemoglobin: 13.1
MCHC: 34.5
MCV: 92.6
Platelets: 325
RBC: 4.09
RDW: 12.2
WBC: 6.9

## 2011-01-14 LAB — DIFFERENTIAL
Basophils Absolute: 0
Basophils Relative: 1
Eosinophils Absolute: 0.1 — ABNORMAL LOW
Eosinophils Relative: 1
Lymphocytes Relative: 32
Lymphs Abs: 2.2
Monocytes Absolute: 0.5
Monocytes Relative: 7
Neutro Abs: 4.1
Neutrophils Relative %: 60

## 2011-01-14 LAB — POCT I-STAT CREATININE
Creatinine, Ser: 1
Operator id: 192351

## 2011-01-14 LAB — PREGNANCY, URINE: Preg Test, Ur: NEGATIVE

## 2011-01-17 LAB — URINALYSIS, ROUTINE W REFLEX MICROSCOPIC
Glucose, UA: NEGATIVE
Ketones, ur: 15 — AB
Nitrite: POSITIVE — AB
Protein, ur: >300 — AB
Specific Gravity, Urine: >1.03 — ABNORMAL HIGH
Urobilinogen, UA: 2 — ABNORMAL HIGH
pH: 6.5

## 2011-01-17 LAB — URINE MICROSCOPIC-ADD ON

## 2013-10-11 ENCOUNTER — Encounter (HOSPITAL_BASED_OUTPATIENT_CLINIC_OR_DEPARTMENT_OTHER): Payer: Self-pay | Admitting: Emergency Medicine

## 2013-10-11 ENCOUNTER — Emergency Department (HOSPITAL_BASED_OUTPATIENT_CLINIC_OR_DEPARTMENT_OTHER)
Admission: EM | Admit: 2013-10-11 | Discharge: 2013-10-11 | Disposition: A | Discharge status: Home / Self Care | Payer: Medicare Other | Attending: Emergency Medicine | Admitting: Emergency Medicine

## 2013-10-11 ENCOUNTER — Emergency Department (HOSPITAL_BASED_OUTPATIENT_CLINIC_OR_DEPARTMENT_OTHER): Payer: Medicare Other

## 2013-10-11 DIAGNOSIS — Z79899 Other long term (current) drug therapy: Secondary | ICD-10-CM | POA: Insufficient documentation

## 2013-10-11 DIAGNOSIS — Y9289 Other specified places as the place of occurrence of the external cause: Secondary | ICD-10-CM | POA: Insufficient documentation

## 2013-10-11 DIAGNOSIS — W11XXXA Fall on and from ladder, initial encounter: Secondary | ICD-10-CM | POA: Insufficient documentation

## 2013-10-11 DIAGNOSIS — E119 Type 2 diabetes mellitus without complications: Secondary | ICD-10-CM | POA: Insufficient documentation

## 2013-10-11 DIAGNOSIS — S300XXA Contusion of lower back and pelvis, initial encounter: Secondary | ICD-10-CM | POA: Insufficient documentation

## 2013-10-11 DIAGNOSIS — I10 Essential (primary) hypertension: Secondary | ICD-10-CM | POA: Insufficient documentation

## 2013-10-11 DIAGNOSIS — Y9389 Activity, other specified: Secondary | ICD-10-CM | POA: Insufficient documentation

## 2013-10-11 HISTORY — DX: Essential (primary) hypertension: I10

## 2013-10-11 HISTORY — DX: Type 2 diabetes mellitus without complications: E11.9

## 2013-10-11 LAB — CBG MONITORING, ED: Glucose-Capillary: 206 mg/dL — ABNORMAL HIGH (ref 70–99)

## 2013-10-11 MED ORDER — KETOROLAC TROMETHAMINE 60 MG/2ML IM SOLN
30.0000 mg | Freq: Once | INTRAMUSCULAR | Status: AC
  Administered 2013-10-11: 30 mg via INTRAMUSCULAR
  Filled 2013-10-11: qty 2

## 2013-10-11 MED ORDER — HYDROCODONE-ACETAMINOPHEN 5-325 MG PO TABS: 1.0000 | ORAL_TABLET | Freq: Four times a day (QID) | ORAL | Status: DC | PRN

## 2013-10-11 NOTE — Discharge Instructions (Signed)
Contusion °A contusion is a deep bruise. Contusions are the result of an injury that caused bleeding under the skin. The contusion may turn blue, purple, or yellow. Minor injuries will give you a painless contusion, but more severe contusions may stay painful and swollen for a few weeks.  °CAUSES  °A contusion is usually caused by a blow, trauma, or direct force to an area of the body. °SYMPTOMS  °· Swelling and redness of the injured area. °· Bruising of the injured area. °· Tenderness and soreness of the injured area. °· Pain. °DIAGNOSIS  °The diagnosis can be made by taking a history and physical exam. An X-ray, CT scan, or MRI may be needed to determine if there were any associated injuries, such as fractures. °TREATMENT  °Specific treatment will depend on what area of the body was injured. In general, the best treatment for a contusion is resting, icing, elevating, and applying cold compresses to the injured area. Over-the-counter medicines may also be recommended for pain control. Ask your caregiver what the best treatment is for your contusion. °HOME CARE INSTRUCTIONS  °· Put ice on the injured area. °¨ Put ice in a plastic bag. °¨ Place a towel between your skin and the bag. °¨ Leave the ice on for 15-20 minutes, 3-4 times a day, or as directed by your health care provider. °· Only take over-the-counter or prescription medicines for pain, discomfort, or fever as directed by your caregiver. Your caregiver may recommend avoiding anti-inflammatory medicines (aspirin, ibuprofen, and naproxen) for 48 hours because these medicines may increase bruising. °· Rest the injured area. °· If possible, elevate the injured area to reduce swelling. °SEEK IMMEDIATE MEDICAL CARE IF:  °· You have increased bruising or swelling. °· You have pain that is getting worse. °· Your swelling or pain is not relieved with medicines. °MAKE SURE YOU:  °· Understand these instructions. °· Will watch your condition. °· Will get help right  away if you are not doing well or get worse. °Document Released: 01/01/2005 Document Revised: 03/29/2013 Document Reviewed: 01/27/2011 °Exit[employer]® Patient Information ©2015 [employer]. This information is not intended to replace advice given to you by your health care provider. Make sure you discuss any questions you have with your health care provider. ° °

## 2013-10-11 NOTE — ED Provider Notes (Signed)
CSN: [id number]     Arrival date & time 10/11/13  1533 History   First MD Initiated Contact with Patient 10/11/13 1615     Chief Complaint  Patient presents with  . Hip Pain     (Consider location/radiation/quality/duration/timing/severity/associated sxs/prior Treatment) HPI  THis is a 63 yo female who presents following a fall. Patient reports that she fell off a 3 foot ladder onto her right buttock and hip.  This happened at approximately 12 noon today. She states that she has increased pain in the right hip and buttock with ambulation. No back pain. She denies any weakness, numbness, or tingling. She denies any radiation of pain. Currently pain is 6/10. She has been ambulatory. She denies other injury. She denies hitting her head or losing consciousness.  Past Medical History  Diagnosis Date  . Diabetes mellitus without complication   . Hypertension    Past Surgical History  Procedure Laterality Date  . Cholecystectomy    . Abdominal hysterectomy     No family history on file. History  Substance Use Topics  . Smoking status: Never Smoker   . Smokeless tobacco: Not on file  . Alcohol Use: No   OB History   Grav Para Term Preterm Abortions TAB SAB Ect Mult Living                 Review of Systems  Respiratory: Negative for chest tightness and shortness of breath.   Cardiovascular: Negative for chest pain.  Gastrointestinal: Negative for abdominal pain.  Genitourinary: Negative for dysuria.  Musculoskeletal: Negative for back pain.       Right hip and buttock pain  Skin: Negative for wound.  Neurological: Negative for headaches.  All other systems reviewed and are negative.     Allergies  Review of patient's allergies indicates no known allergies.  Home Medications   Prior to Admission medications   Medication Sig Start Date End Date Taking? Authorizing Provider  clonazePAM (KLONOPIN) 1 MG tablet Take 1 mg by mouth 2 (two) times daily.   Yes Historical  Provider, MD  gabapentin (NEURONTIN) 300 MG capsule Take 300 mg by mouth 3 (three) times daily.   Yes Historical Provider, MD  rosuvastatin (CRESTOR) 20 MG tablet Take 20 mg by mouth daily.   Yes Historical Provider, MD  zolpidem (AMBIEN) 5 MG tablet Take 5 mg by mouth at bedtime as needed for sleep.   Yes Historical Provider, MD  HYDROcodone-acetaminophen (NORCO/VICODIN) 5-325 MG per tablet Take 1-2 tablets by mouth every 6 (six) hours as needed for moderate pain. 10/11/13   Merryl Hacker, MD   BP 126/76  Pulse 94  Temp(Src) 98 F (36.7 C) (Oral)  Resp 18  Ht 5\' 8"  (1.727 m)  Wt 198 lb (89.812 kg)  BMI 30.11 kg/m2  SpO2 99% Physical Exam  Nursing note and vitals reviewed. Constitutional: She is oriented to person, place, and time. She appears well-developed and well-nourished.  Uncomfortable appearing, leaning towards the left side in a wheelchair  HENT:  Head: Normocephalic and atraumatic.  Eyes: Pupils are equal, round, and reactive to light.  Cardiovascular: Normal rate, regular rhythm and normal heart sounds.   No murmur heard. Pulmonary/Chest: Effort normal and breath sounds normal. No respiratory distress. She has no wheezes.  Abdominal: Soft. There is no tenderness.  Musculoskeletal:  Normal range of motion of the right hip and knee, tenderness palpation over the right buttock, no midline thoracic or lumbar tenderness  Neurological: She is alert and  oriented to person, place, and time.  5 out of 5 strength in bilateral lower extremities  Skin: Skin is warm and dry.  Psychiatric: She has a normal mood and affect.    ED Course  Procedures (including critical care time) Labs Review Labs Reviewed  CBG MONITORING, ED - Abnormal; Notable for the following:    Glucose-Capillary 206 (*)    All other components within normal limits    Imaging Review Dg Lumbar Spine Complete  10/11/2013   CLINICAL DATA:  Lumbar pain, fell today, difficulty moving, past history diabetes,  hypertension  EXAM: LUMBAR SPINE - COMPLETE 4+ VIEW  COMPARISON:  09/16/2008 ; correlation CT abdomen and pelvis 09/18/2008  FINDINGS: Five non-rib-bearing lumbar vertebrae.  Multilevel disc space narrowing and endplate spur formation greatest at L3-L4.  Vertebral body heights maintained without fracture or subluxation.  Facet degenerative changes lower lumbar spine.  No spondylolysis.  Mild scattered atherosclerotic calcifications.  Minimal levoconvex lumbar scoliosis apex L3-L4.  SI joint spaces preserved.  Curvilinear calcification in the paraspinal high LEFT upper quadrant corresponds to calcified adrenal nodules on prior CT, unchanged since 2010.  IMPRESSION: Degenerative disc and facet disease changes lumbar spine.  No acute lumbar spine abnormalities.   Electronically Signed   By: Lavonia Dana M.D.   On: 10/11/2013 17:31   Dg Hip Complete Right  10/11/2013   CLINICAL DATA:  Right hip pain  EXAM: RIGHT HIP - COMPLETE 2+ VIEW  COMPARISON:  None.  FINDINGS: No fracture or dislocation. No lytic or sclerotic osseous lesion. The left hip is unremarkable.  Surgical clips in the right groin.  IMPRESSION: No acute osseous injury of the right hip.   Electronically Signed   By: Kathreen Devoid   On: 10/11/2013 16:30     EKG Interpretation None      MDM   Final diagnoses:  Contusion, buttock, initial encounter   Patient presents following a fall. She is nontoxic on exam. She is nonfocal. She has range of motion of the right hip.  No obvious deformity. She does have tenderness to palpation over the right buttock without obvious overlying skin changes. Plain films of the right hip and lumbar spine were obtained and are reassuring. Patient was given IM Toradol because she is driving and reports some improvement of symptoms. I have offered the patient chart pain medication which she has deferred at this time. She would like to go home. We'll discharge home with a short course of hydrocodone. Patient was given  strict return precautions.  After history, exam, and medical workup I feel the patient has been appropriately medically screened and is safe for discharge home. Pertinent diagnoses were discussed with the patient. Patient was given return precautions.     Merryl Hacker, MD 10/11/13 [phone]

## 2013-10-11 NOTE — ED Notes (Signed)
Pt reports she fell off 3 foot ladder.  Sts that she landed on right hip. Pain down leg. Hurts when walking.

## 2013-11-09 ENCOUNTER — Emergency Department (HOSPITAL_BASED_OUTPATIENT_CLINIC_OR_DEPARTMENT_OTHER)
Admission: EM | Admit: 2013-11-09 | Discharge: 2013-11-09 | Disposition: A | Discharge status: Home / Self Care | Payer: Medicare Other | Attending: Emergency Medicine | Admitting: Emergency Medicine

## 2013-11-09 ENCOUNTER — Encounter (HOSPITAL_BASED_OUTPATIENT_CLINIC_OR_DEPARTMENT_OTHER): Payer: Self-pay | Admitting: Emergency Medicine

## 2013-11-09 ENCOUNTER — Emergency Department (HOSPITAL_BASED_OUTPATIENT_CLINIC_OR_DEPARTMENT_OTHER): Payer: Medicare Other

## 2013-11-09 DIAGNOSIS — IMO0002 Reserved for concepts with insufficient information to code with codable children: Secondary | ICD-10-CM | POA: Insufficient documentation

## 2013-11-09 DIAGNOSIS — S99919A Unspecified injury of unspecified ankle, initial encounter: Secondary | ICD-10-CM

## 2013-11-09 DIAGNOSIS — F411 Generalized anxiety disorder: Secondary | ICD-10-CM | POA: Insufficient documentation

## 2013-11-09 DIAGNOSIS — S99929A Unspecified injury of unspecified foot, initial encounter: Secondary | ICD-10-CM

## 2013-11-09 DIAGNOSIS — W208XXA Other cause of strike by thrown, projected or falling object, initial encounter: Secondary | ICD-10-CM | POA: Diagnosis not present

## 2013-11-09 DIAGNOSIS — I10 Essential (primary) hypertension: Secondary | ICD-10-CM | POA: Insufficient documentation

## 2013-11-09 DIAGNOSIS — E119 Type 2 diabetes mellitus without complications: Secondary | ICD-10-CM | POA: Insufficient documentation

## 2013-11-09 DIAGNOSIS — L089 Local infection of the skin and subcutaneous tissue, unspecified: Secondary | ICD-10-CM

## 2013-11-09 DIAGNOSIS — Z79899 Other long term (current) drug therapy: Secondary | ICD-10-CM | POA: Insufficient documentation

## 2013-11-09 DIAGNOSIS — S8990XA Unspecified injury of unspecified lower leg, initial encounter: Secondary | ICD-10-CM | POA: Diagnosis present

## 2013-11-09 DIAGNOSIS — Y929 Unspecified place or not applicable: Secondary | ICD-10-CM | POA: Diagnosis not present

## 2013-11-09 DIAGNOSIS — S90412A Abrasion, left great toe, initial encounter: Secondary | ICD-10-CM

## 2013-11-09 DIAGNOSIS — R52 Pain, unspecified: Secondary | ICD-10-CM

## 2013-11-09 DIAGNOSIS — Y9389 Activity, other specified: Secondary | ICD-10-CM | POA: Diagnosis not present

## 2013-11-09 HISTORY — DX: Post-traumatic stress disorder, unspecified: F43.10

## 2013-11-09 HISTORY — DX: Anxiety disorder, unspecified: F41.9

## 2013-11-09 MED ORDER — CLINDAMYCIN HCL 300 MG PO CAPS: 300.0000 mg | ORAL_CAPSULE | Freq: Four times a day (QID) | ORAL | Status: AC

## 2013-11-09 NOTE — ED Provider Notes (Signed)
CSN: [id number]     Arrival date & time 11/09/13  1322 History   First MD Initiated Contact with Patient 11/09/13 1331   History provided by patient.   Chief Complaint  Patient presents with  . Toe Injury   HPI  Patient reported that symptoms started about 1 week ago after large piece of plywood was dropped on both feet, was wearing open toe sandals. Stated that initial injury included a skin abrasion on top of both great toes and pain in 2nd/3rd toes of Right foot, at time of injury she had some associated bleeding initially. Reported that these injuries seemed to heal, reduced pain, used bandaids and polysporin ointment.  Currently complains of worsening left great toe pain since 0230 this morning, the pain woke her up, and she was concerned that the abrasion appeared more red, pain extended across her big toe. Denies any fever/chills, recurrent bleeding, drainage of pus, n/v.  Significant PMH Type 2 Diabetes. Denies prior foot ulcerations or infections.  Past Medical History  Diagnosis Date  . Diabetes mellitus without complication   . Hypertension   . PTSD (post-traumatic stress disorder)   . Anxiety    Past Surgical History  Procedure Laterality Date  . Cholecystectomy    . Abdominal hysterectomy    . Back surgery     No family history on file. History  Substance Use Topics  . Smoking status: Never Smoker   . Smokeless tobacco: Not on file  . Alcohol Use: No   OB History   Grav Para Term Preterm Abortions TAB SAB Ect Mult Living                 Review of Systems  See above HPI  Allergies  Review of patient's allergies indicates no known allergies.  Home Medications   Prior to Admission medications   Medication Sig Start Date End Date Taking? Authorizing Provider  clindamycin (CLEOCIN) 300 MG capsule Take 1 capsule (300 mg total) by mouth 4 (four) times daily. 11/09/13 11/18/13  Nobie Putnam, DO  clonazePAM (KLONOPIN) 1 MG tablet Take 1 mg by mouth 2 (two)  times daily.    Historical Provider, MD  gabapentin (NEURONTIN) 300 MG capsule Take 300 mg by mouth 3 (three) times daily.    Historical Provider, MD  HYDROcodone-acetaminophen (NORCO/VICODIN) 5-325 MG per tablet Take 1-2 tablets by mouth every 6 (six) hours as needed for moderate pain. 10/11/13   Merryl Hacker, MD  rosuvastatin (CRESTOR) 20 MG tablet Take 20 mg by mouth daily.    Historical Provider, MD  zolpidem (AMBIEN) 5 MG tablet Take 5 mg by mouth at bedtime as needed for sleep.    Historical Provider, MD   BP 132/75  Pulse 77  Temp(Src) 97.8 F (36.6 C) (Oral)  Resp 18  Ht 5\' 8"  (1.727 m)  Wt 180 lb (81.647 kg)  BMI 27.38 kg/m2  SpO2 100% Physical Exam  Gen - well-appearing, NAD HEENT - MMM Heart - RRR, no murmurs heard Lungs - CTAB Ext - Left great toe dorsal 1cm ulceration appears to be healing, mild local surrounding erythema and minimal edema, no area of induration or fluctuance, no drainage of pus. Right foot with mostly healed great toe superficial abrasion (not open). Bilateral distal pulses intact +2 Skin - warm, dry Neuro - awake, alert, oriented, grossly non-focal, intact muscle strength 5/5 b/l, intact distal sensation to light touch, gait normal   ED Course  Procedures (including critical care time) Labs Review Labs  Reviewed - No data to display  Imaging Review Dg Foot Complete Left  11/09/2013   CLINICAL DATA:  Trauma.  EXAM: LEFT FOOT - COMPLETE 3+ VIEW  COMPARISON:  None.  FINDINGS: Soft tissue structures are unremarkable. No radiopaque foreign bodies. Non fused secondary ossification center versus old fracture base of the left fifth metatarsal. No acute fracture. No dislocation.  IMPRESSION: No acute abnormality.   Electronically Signed   By: Marcello Moores  Register   On: 11/09/2013 14:33   Dg Foot Complete Right  11/09/2013   CLINICAL DATA:  Dropped object on foot.  EXAM: RIGHT FOOT COMPLETE - 3+ VIEW  COMPARISON:  None.  FINDINGS: There is joint space narrowing  at the first MTP joint. No evidence for an acute fracture. Spurring along the plantar aspect of the calcaneus. No gross soft tissue abnormality.  IMPRESSION: No acute bone abnormality to the right foot.  Calcaneal spurring.  Degenerative disease at the first MTP joint.   Electronically Signed   By: Markus Daft M.D.   On: 11/09/2013 14:41     EKG Interpretation None      MDM   Final diagnoses:  Abrasion of great toe, left, infected, initial encounter   32 yr F with PMH DM2, HTN, Anxiety presents for worsening left great toe pain in setting of superficial ulceration after dropped plywood on feet 1 week ago. Denies fevers/chills or systemic symptoms. Appearance of left great toe ulcer without focal abscess or extending erythema, appears to be local superficial infection with gradually healing ulcer, no evidence of extension into toe with a paronychia, neurovascularly intact. Right toe with healed abrasion, moves all digits, minimal pain.  Proceed with bilateral foot X-rays to rule out occult fracture, superficial cellulitis, or extension osteo in setting of Diabetic with foot ulcer.  UPDATE @ 1449 - X-rays reviewed with negative acute findings, no suggestion for spread of infection. Patient's left great toe ulcer was washed and dressed.  Discharge to home with rx Clindamycin 300mg  PO QID x 10 days to include MRSA coverage, provided handout for locating new PCP for follow-up, reassurance, return precautions.     Nobie Putnam, DO 11/09/13 1554

## 2013-11-09 NOTE — ED Notes (Signed)
Dropped plywood on both great toes last week-c/o break in skin to both great toes

## 2013-11-09 NOTE — Discharge Instructions (Signed)
You were treated for a left great toe abrasion that appears to have a superficial infection. The X-rays showed that you have no broken toes or bones in your feet. It looks like the infection superficial only and has not spread into the tissue of your toe or into the bone. Take Clindamycin 300mg  capsules 4 times daily for total 10 days, take with food to avoid upset stomach. Make sure that you keep it clean, and observe it closely.  If you develop any extension of infection, redness, or worsening pain, drainage of pus, or fevers/chills or new symptoms, please seek immediate medical attention for re-evaluation, may return to Emergency Department.  Recommend that you locate a regular Primary Doctor to follow-up with, and that they re-examine your toe within 3-5 days to make sure that there is no worsening.   Emergency Department Resource Guide 1) Find a Doctor and Pay Out of Pocket Although you won't have to find out who is covered by your insurance plan, it is a good idea to ask around and get recommendations. You will then need to call the office and see if the doctor you have chosen will accept you as a new patient and what types of options they offer for patients who are self-pay. Some doctors offer discounts or will set up payment plans for their patients who do not have insurance, but you will need to ask so you aren't surprised when you get to your appointment.  2) Contact Your Local Health Department Not all health departments have doctors that can see patients for sick visits, but many do, so it is worth a call to see if yours does. If you don't know where your local health department is, you can check in your phone book. The CDC also has a tool to help you locate your state's health department, and many state websites also have listings of all of their local health departments.  3) Find a [facility] If your illness is not likely to be very severe or complicated, you may want to try a  walk in clinic. These are popping up all over the country in pharmacies, drugstores, and shopping centers. They're usually staffed by nurse practitioners or physician assistants that have been trained to treat common illnesses and complaints. They're usually fairly quick and inexpensive. However, if you have serious medical issues or chronic medical problems, these are probably not your best option.  No Primary Care Doctor: - Call Health Connect at  [phone] - they can help you locate a primary care doctor that  accepts your insurance, provides certain services, etc. - Physician Referral Service- [phone]  Chronic Pain Problems: Organization         Address  Phone   Notes  [facility]  [phone] Patients need to be referred by their primary care doctor.   Medication Assistance: Organization         Address  Phone   Notes  [facility] Medication [facility] [address] [place], [place] [address] [phone] --Must be a resident of [facility] -- Must have NO insurance coverage whatsoever (no Medicaid/ Medicare, etc.) -- The pt. MUST have a primary care doctor that directs their care regularly and follows them in the community   MedAssist  [phone]   [employer]  [phone]    Agencies that provide inexpensive medical care: Organization         Address  Phone   Notes  [name]  [phone]   [name]    [phone]   [facility] [place], [place] [address] [phone]   [place] [address], [place] [phone]   Planned Parenthood    [phone]   [facility]    [phone]   [place] and [address], [place] Phone:  [phone], Fax:  [phone] Hours of Operation:  9 am - 6 pm, M-F.  Also accepts Medicaid/Medicare and self-pay.  [facility] for [place], [address], [place] Phone: [phone], Fax: [phone]. Hours of Operation:  8:30 am - 5:30 pm, M-F.  Also accepts Medicaid and self-pay.  [facility] [place] [address] [place] Phone: [phone]   [place], [place], [place] [phone], Ext. [phone] Mondays & Thursdays: 7-9 AM.  First 15 patients are seen on a first come, first serve basis.    [place] Providers:  Organization         Address  Phone   Notes  [facility] [address], [place] [phone] Also accepts self-pay patients.  [facility] [address], [place]  [phone]   [place], [address], [place] [phone]   [facility] [address] [place] [phone]   Lucianne Lei [address], [place]   [phone] Only accepts [place] Access [place] patients after they have their name applied to their card.   Self-Pay (no insurance) in [facility]:  Organization         Address  Phone   Notes  Sickle Cell Patients, [facility] [phone]   [facility] Urgent Care [place] at [place] [phone]   [name] [place]  [place], [place], [place] [phone]   [facility]/Dr. Osei-Bonsu  [address] [place] or [address], [place] [phone] Phone number for both [place] and [place] locations is the same.  Urgent Medical and [facility] [address] [place] [phone]   [facility] [address] [place] or [address] [phone]   [facility] [address] [place] [phone], phone; [phone], fax Sees  patients 1st and 3rd Saturday of every month.  Must not qualify for public or private insurance (i.e. Medicaid, Medicare, [place] Health Choice, Veterans' Benefits)  Household income should be no more than 200% of the poverty level The clinic cannot treat you if you are pregnant or think you are pregnant  Sexually transmitted diseases are not treated at the clinic.    Dental Care: Organization         Address  Phone  Notes  [facility] Department of [facility] [place] [phone] Accepts children up to age 45 who are enrolled in [place] or [place]; pregnant women with a Medicaid card; and children who have applied for Medicaid or [place] Health Choice, but were declined, whose parents can pay a reduced fee at time of service.  [facility] Department of  [employer]  [address], [place] [phone] Accepts children up to age 73 who are enrolled in Medicaid or [place]; pregnant women with a Medicaid card; and children who have applied for Medicaid or  Health Choice, but were declined, whose parents can pay a reduced fee at time of service.  [place] Adult Dental Access PROGRAM  [place] [phone] Patients are seen by appointment only. Walk-ins are not accepted. [place] will see patients 12 years of age and older. Monday - Tuesday (8am-5pm) Most Wednesdays (8:30-5pm) $30 per visit, cash only  [facility] Adult Dental Access PROGRAM  [address], [facility] [phone] Patients are seen by appointment only. Walk-ins are not accepted. [place] will see patients 70 years of age and older. One Wednesday Evening (Monthly: Volunteer Based).  $30 per visit, cash only  [place]  [phone] for adults; Children under age 65, call Graduate Pediatric Dentistry at [phone]. Children aged 37-14, please call [phone] to request a  pediatric application.  Dental services are provided in all areas of dental care including fillings, crowns and bridges, complete and partial dentures, implants, gum treatment, root canals, and extractions. Preventive care is also provided. Treatment is provided to both adults and children. Patients are selected via a lottery and there is often a waiting list.   [facility] [address], [place]  [phone] [email or web address]   [facility] [address] [place], [place] [phone], Ext. [phone] Second and Fourth Thursday of each month, opens at 6:30 AM; Clinic ends at 9 AM.  Patients are seen on a first-come first-served basis, and a limited number are seen during each clinic.   [facility]  [address] Hillard Danker [place], [place] [phone]   Eligibility Requirements You must have lived in [place], [place], or [place] counties for at least the last three months.   You cannot be eligible for state or federal sponsored [employer], including [employer], [place], or [employer].   You generally cannot be eligible for healthcare insurance through your employer.    How to apply: Eligibility screenings are held every Tuesday and Wednesday afternoon from 1:00 pm until 4:00 pm. You do not need an appointment for the interview!  [facility] [address], [place], [place]   [place]  [place] Department  [place]  [phone]    Behavioral Health Resources in the Community: Intensive Outpatient Programs Organization         Address  Phone  Notes  [place]. [address], [place], [place] [phone]   [facility] Outpatient [address] [place], [place]   ADS: Alcohol & Drug Svcs [address], [place], [place]   [place] [address]  [place], [place] or [phone]   Substance Abuse Resources Organization         Address  Phone  Notes  Alcohol and Drug Services  [phone]   [place]  [phone]   [facility] [place]   Chinita Pester  [phone]   Residential & Outpatient Substance Abuse Program  [phone]   Psychological Services Organization         Address  Phone  Notes  [place]  [place]   [place] [address] [place] or [phone]    Mobile Crisis Teams Organization         Address  Phone  Notes  [facility], Mobile Crisis Care Unit  [phone]   [facility]  [address] [place], [place]   [place] [address], [place] [phone]    Self-Help/Support Groups Organization         Address  Phone             Notes  [place]. of [place] - variety of support groups  [place] Call for more information  Narcotics Anonymous (NA), [facility] [address], [employer] [place]  2 meetings at this location   Special educational needs teacher         Address  Phone  Notes  [facility] Residential Treatment [place],    [place]  [phone]   [facility]  [address] [place] [address], [place], [place]   [place], [place] [phone] Admissions: 8am-3pm M-F  Incentives Substance [place] [address],    [place], [place] [phone]   [facility] [address] [place], [place], [place]   [facility] [address],  [place], [place]   [facility] - Intensive Outpatient [address] Kristeen [address], [place], [place]   [facility] ([place].,    [place], [place] [phone] or [phone]   [facility] Services (RTS) [address] [place], [place] Accepts Medicaid  [facility] [place] [address]  [place] [phone] Substance Abuse/Addiction Treatment   [facility] Organization         Address  Phone  Notes  [facility]  [phone]   Domenic Schwab, PhD [address] Arlis Porta [place], [place]   [phone] or [phone]   [place], [place] [phone]   [facility] [address] [place], [place] [phone] Insurance/Medicaid/sponsorship through [facility] and Families [address], [address] [place]                                    [place], [place] [phone] [place] [address], [place] [phone]    Dr. Adele Schilder  [phone]   [facility] [place] [facility] Dept. 1) [address], [place] 2) [place] 3)  [place] [address], [place] [phone]  [phone]   [place] [phone] or [phone] (After Hours)

## 2013-11-09 NOTE — ED Notes (Signed)
MD at bedside. 

## 2013-11-10 NOTE — ED Provider Notes (Signed)
I saw and evaluated the patient, reviewed the resident's note and I agree with the findings and plan.   EKG Interpretation None      Pt with small open sore to left toe, minor surrounding erythema.  Will start on abx, close follow up  Malvin Johns, MD 11/10/13 [phone]

## 2013-11-30 ENCOUNTER — Encounter: Payer: Self-pay | Admitting: Gastroenterology

## 2014-03-09 ENCOUNTER — Encounter (HOSPITAL_BASED_OUTPATIENT_CLINIC_OR_DEPARTMENT_OTHER): Payer: Self-pay | Admitting: *Deleted

## 2014-03-09 ENCOUNTER — Emergency Department (HOSPITAL_BASED_OUTPATIENT_CLINIC_OR_DEPARTMENT_OTHER)
Admission: EM | Admit: 2014-03-09 | Discharge: 2014-03-09 | Disposition: A | Discharge status: Home / Self Care | Payer: Medicare Other | Attending: Emergency Medicine | Admitting: Emergency Medicine

## 2014-03-09 DIAGNOSIS — F419 Anxiety disorder, unspecified: Secondary | ICD-10-CM | POA: Diagnosis not present

## 2014-03-09 DIAGNOSIS — N390 Urinary tract infection, site not specified: Secondary | ICD-10-CM | POA: Insufficient documentation

## 2014-03-09 DIAGNOSIS — R3915 Urgency of urination: Secondary | ICD-10-CM | POA: Diagnosis present

## 2014-03-09 DIAGNOSIS — Z79899 Other long term (current) drug therapy: Secondary | ICD-10-CM | POA: Insufficient documentation

## 2014-03-09 DIAGNOSIS — F4310 Post-traumatic stress disorder, unspecified: Secondary | ICD-10-CM | POA: Insufficient documentation

## 2014-03-09 DIAGNOSIS — I10 Essential (primary) hypertension: Secondary | ICD-10-CM | POA: Insufficient documentation

## 2014-03-09 DIAGNOSIS — E119 Type 2 diabetes mellitus without complications: Secondary | ICD-10-CM | POA: Insufficient documentation

## 2014-03-09 LAB — URINE MICROSCOPIC-ADD ON

## 2014-03-09 LAB — URINALYSIS, ROUTINE W REFLEX MICROSCOPIC
Bilirubin Urine: NEGATIVE
Glucose, UA: >1000 mg/dL — AB
Ketones, ur: NEGATIVE mg/dL
Nitrite: POSITIVE — AB
Protein, ur: NEGATIVE mg/dL
Specific Gravity, Urine: 1.022 (ref 1.005–1.030)
Urobilinogen, UA: 0.2 mg/dL (ref 0.0–1.0)
pH: 6.5 (ref 5.0–8.0)

## 2014-03-09 LAB — CBG MONITORING, ED: Glucose-Capillary: 95 mg/dL (ref 70–99)

## 2014-03-09 MED ORDER — PHENAZOPYRIDINE HCL 200 MG PO TABS: 200.0000 mg | ORAL_TABLET | Freq: Three times a day (TID) | ORAL | Status: DC | PRN

## 2014-03-09 MED ORDER — AMOXICILLIN-POT CLAVULANATE 875-125 MG PO TABS
1.0000 | ORAL_TABLET | Freq: Two times a day (BID) | ORAL | Status: DC
  Administered 2014-03-09: 1 via ORAL

## 2014-03-09 MED ORDER — AMOXICILLIN-POT CLAVULANATE 875-125 MG PO TABS
1.0000 | ORAL_TABLET | Freq: Two times a day (BID) | ORAL | Status: DC
Start: 2014-03-09 — End: 2014-06-07

## 2014-03-09 MED ORDER — AMOXICILLIN-POT CLAVULANATE 875-125 MG PO TABS
ORAL_TABLET | ORAL | Status: AC
  Filled 2014-03-09: qty 1

## 2014-03-09 NOTE — ED Notes (Signed)
Pt report burning with urination, unable to urinate as much, right lower back pain, and chills.

## 2014-03-09 NOTE — Discharge Instructions (Signed)
Urinary Tract Infection °Urinary tract infections (UTIs) can develop anywhere along your urinary tract. Your urinary tract is your body's drainage system for removing wastes and extra water. Your urinary tract includes two kidneys, two ureters, a bladder, and a urethra. Your kidneys are a pair of bean-shaped organs. Each kidney is about the size of your fist. They are located below your ribs, one on each side of your spine. °CAUSES °Infections are caused by microbes, which are microscopic organisms, including fungi, viruses, and bacteria. These organisms are so small that they can only be seen through a microscope. Bacteria are the microbes that most commonly cause UTIs. °SYMPTOMS  °Symptoms of UTIs may vary by age and gender of the patient and by the location of the infection. Symptoms in young women typically include a frequent and intense urge to urinate and a painful, burning feeling in the bladder or urethra during urination. Older women and men are more likely to be tired, shaky, and weak and have muscle aches and abdominal pain. A fever may mean the infection is in your kidneys. Other symptoms of a kidney infection include pain in your back or sides below the ribs, nausea, and vomiting. °DIAGNOSIS °To diagnose a UTI, your caregiver will ask you about your symptoms. Your caregiver also will ask to provide a urine sample. The urine sample will be tested for bacteria and white blood cells. White blood cells are made by your body to help fight infection. °TREATMENT  °Typically, UTIs can be treated with medication. Because most UTIs are caused by a bacterial infection, they usually can be treated with the use of antibiotics. The choice of antibiotic and length of treatment depend on your symptoms and the type of bacteria causing your infection. °HOME CARE INSTRUCTIONS °· If you were prescribed antibiotics, take them exactly as your caregiver instructs you. Finish the medication even if you feel better after you  have only taken some of the medication. °· Drink enough water and fluids to keep your urine clear or pale yellow. °· Avoid caffeine, tea, and carbonated beverages. They tend to irritate your bladder. °· Empty your bladder often. Avoid holding urine for long periods of time. °· Empty your bladder before and after sexual intercourse. °· After a bowel movement, women should cleanse from front to back. Use each tissue only once. °SEEK MEDICAL CARE IF:  °· You have back pain. °· You develop a fever. °· Your symptoms do not begin to resolve within 3 days. °SEEK IMMEDIATE MEDICAL CARE IF:  °· You have severe back pain or lower abdominal pain. °· You develop chills. °· You have nausea or vomiting. °· You have continued burning or discomfort with urination. °MAKE SURE YOU:  °· Understand these instructions. °· Will watch your condition. °· Will get help right away if you are not doing well or get worse. °Document Released: 01/01/2005 Document Revised: 09/23/2011 Document Reviewed: 05/02/2011 °ExitCare® Patient Information ©2015 [employer]. This information is not intended to replace advice given to you by your health care provider. Make sure you discuss any questions you have with your health care provider. ° ° °Emergency Department Resource Guide °1) Find a Doctor and Pay Out of Pocket °Although you won't have to find out who is covered by your insurance plan, it is a good idea to ask around and get recommendations. You will then need to call the office and see if the doctor you have chosen will accept you as a new patient and what types of   options they offer for patients who are self-pay. Some doctors offer discounts or will set up payment plans for their patients who do not have insurance, but you will need to ask so you aren't surprised when you get to your appointment. ° °2) Contact Your Local Health Department °Not all health departments have doctors that can see patients for sick visits, but many do, so it is worth  a call to see if yours does. If you don't know where your local health department is, you can check in your phone book. The CDC also has a tool to help you locate your state's health department, and many state websites also have listings of all of their local health departments. ° °3) Find a Walk-in Clinic °If your illness is not likely to be very severe or complicated, you may want to try a walk in clinic. These are popping up all over the country in pharmacies, drugstores, and shopping centers. They're usually staffed by nurse practitioners or physician assistants that have been trained to treat common illnesses and complaints. They're usually fairly quick and inexpensive. However, if you have serious medical issues or chronic medical problems, these are probably not your best option. ° °No Primary Care Doctor: °- Call [facility] at  832-8000 - they can help you locate a primary care doctor that  accepts your insurance, provides certain services, etc. °- Physician Referral Service- 1-800-533-3463 ° °Chronic Pain Problems: °Organization         Address  Phone   Notes  °[facility]  (336) 297-2271 Patients need to be referred by their primary care doctor.  ° °Medication Assistance: °Organization         Address  Phone   Notes  °[place] [facility] [address] °[place], [place] [address] (336) 641-8030 --Must be a resident of [place] °-- Must have NO insurance coverage whatsoever (no Medicaid/ Medicare, etc.) °-- The pt. MUST have a primary care doctor that directs their care regularly and follows them in the community °  °[facility]  (866) 331-1348   °[employer]  (888) 892-1162   ° °Agencies that provide inexpensive medical care: °Organization         Address  Phone   Notes  °[facility]  (336) 832-8035   °[facility]    (336) 832-7272   °[facility] [address] °[place], [place]  [address] (336) 832-4777   °[facility] [address], °[place] (336) 271-4999   °[facility]    (336) 373-0678   °[facility]    (336) 272-1050   °[facility] ° [address], [place] Phone:  (336) 832-4444, Fax:  (336) 832-4440 Hours of Operation:  9 am - 6 pm, M-F.  Also accepts Medicaid/Medicare and self-pay.  °[facility] ° [address], [place] Phone: (336) 832-3150, Fax: (336) 832-3151. Hours of Operation:  8:30 am - 5:30 pm, M-F.  Also accepts Medicaid and self-pay.  °[facility] [place] [address], [place] Phone: (336) 878-6027   °[facility] [address], [place], [place] (336)723-1848, Ext. 123 Mondays & Thursdays: 7-9 AM.  First 15 patients are seen on a first come, first serve basis. °  ° °Medicaid-accepting [place] Providers: ° °Organization         Address  Phone   Notes  °  [facility] [address], [place] (336) 641-2100 Also accepts self-pay patients.  °[facility] [address], [place] ° (336) 856-9996   °[facility] [address], [place] (336) 288-8857   °[facility] [address] [place] [address], [place] (336) 299-7000   °Veita Bland [address], [place]  ° (336) 373-1557 Only accepts [place] Access Medicaid patients after they have their name applied to their card.  ° °Self-Pay (no insurance) in [place]: ° °Organization         Address  Phone   Notes  °Sickle Cell Patients, [facility] [address], [place] (336) 832-1970   °[facility] [address], [place] (336) 832-4400   °[facility] ° [address], [place] (336) 992-4800   °[facility]/Dr. Osei-Bonsu ° [address] [place] [address], [place] or [address], [place] (336) 841-8500 Phone number for both [place] and [place] locations is the same.  °[facility] [address], [place] (336) 299-0000   °[facility] [address] [place] [address], [place] or [address] (336) 852-7530 °(336) 878-2260   °[facility] [address], [place] (336) 350-1642, phone; (336) 294-5005, fax Sees patients 1st and 3rd Saturday of every month.  Must not qualify for public or private insurance (i.e. Medicaid, Medicare, [place] Health Choice, Veterans' Benefits) • Household income should be no more than 200% of the poverty level •The clinic cannot treat you if you are pregnant or think you are pregnant • Sexually transmitted diseases are not treated at the clinic.  ° ° °Dental Care: °Organization         Address  Phone  Notes  °[facility] [address],  (336) 641-6152 Accepts children up to age 21 who are enrolled in Medicaid or [place] Health Choice; pregnant women with a Medicaid card; and children who have applied for Medicaid or [place] Health Choice, but were declined, whose parents can pay a reduced fee at time of service.  °[facility] [place]  [address], [place] (336) 641-7733 Accepts children up to age 21 who are enrolled in Medicaid or [place] Health Choice; pregnant women with a Medicaid card; and children who have applied for Medicaid or [place] Health Choice, but were declined, whose parents can pay a reduced fee at time of service.  °[facility] ° [address],  (336) 641-4533 Patients are seen by appointment only. Walk-ins are not accepted. [facility] will see patients 18 years of age and older. °Monday - Tuesday (8am-5pm) °Most Wednesdays (8:30-5pm) °$30 per visit, cash only  °[facility] ° [address], [place] (336) 641-4533  Patients are seen by appointment only. Walk-ins are not accepted. [facility] will see patients 18 years of age and older. °One Wednesday Evening (Monthly: Volunteer Based).  $30 per visit, cash only  °[facility]  (919) 537-3737 for adults; Children under age 4, call Graduate Pediatric Dentistry at (919) [phone]. Children aged 4-14, please call (919) [phone] to request a pediatric application. ° Dental services are provided in all areas of dental care including fillings, crowns and bridges, complete and partial   implants, gum treatment, root canals, and extractions. Preventive care is also provided. Treatment is provided to both adults and children. °Patients are selected via a lottery and there is often a waiting list. °  °[facility] [address], °[place] ° (336) 763-8833 [email or web address] °  °[facility] [address], [place], [place] (336)723-1848, Ext. 123 Second and Fourth Thursday of each month, opens at 6:30 AM; Clinic ends at 9 AM.  Patients are seen on a first-come first-served basis, and a limited number are seen during each clinic.  ° °[facility] ° [address], [place], [place] (336) 723-7904   Eligibility Requirements °You must have lived in [place], [place], or [place] counties for at least the last three months. °  You cannot be eligible for state or federal sponsored healthcare insurance, including Veterans Administration, Medicaid, or Medicare. °  You generally cannot be eligible for healthcare insurance through your employer.  °  How to apply: °Eligibility screenings are held every Tuesday and Wednesday afternoon from 1:00 pm until 4:00 pm. You do not need an appointment for the interview!  °[facility] [address], [place], [place] 336-631-2330   °[facility]  336-342-8273   °[facility]  336-703-3100   °[facility]  336-570-6415    ° °Behavioral Health Resources in the Community: °Intensive Outpatient Programs °Organization         Address  Phone  Notes  °[facility] [address], [place], [place] 336-878-6098   °[facility] [address], [place], [place] 336-832-9800   °[facility] [address], [place], [place] ° 336-882-2125   °[facility] [address],  °[place], [place] 1-800-853-5163 or 336-641-4981   °Substance Abuse Resources °Organization         Address  Phone  Notes  °[facility]  336-882-2125   °[facility]  336-784-9470   °[facility]  336-285-9073   °[facility]  336-845-3988   °Residential & Outpatient Substance Abuse Program  1-800-659-3381   °Psychological Services °Organization         Address  Phone  Notes  °[facility]  336- 832-9600   °[facility]  336- 378-7881   °[facility] [address], [place] 1-800-853-5163 or 336-641-4981   ° °Mobile Crisis Teams °Organization         Address  Phone  Notes  °[facility], Mobile Crisis Care Unit  1-877-626-1772   °Assertive °[facility] ° [address] [place], [place] 336-834-9664   °Sharon DeEsch [address] ° [place] 336-554-5454   ° °Self-Help/Support Groups °Organization         Address  Phone             Notes  °[facility]  - variety of support groups  336- 373-1402 Call for more information  °Narcotics Anonymous (NA), [facility] [address], °[place]   2 meetings at this location  ° °Residential Treatment Programs °Organization         Address  Phone  Notes  °[facility] [address],    °   1-866-801-8205   °[facility] ° [address], [place],  704-293-8524   °[facility] [address], [place] 336-845-3988 Admissions: 8am-3pm M-F  °[facility] [address]    °[place], [place] 336-841-1104   °[facility] [address], [place], [place] 336-379-7146   °[facility] [address]  °[place], [place] 336-285-9073   °[facility] Intensive Outpatient [address], [place], [place] 336-852-3033   °[facility] [address]  °[place], [place] 1-877-615-2722 or 336-784-9470   °[facility] [address] [place], [place] 336-227-7417 Accepts Medicaid  °[facility] [address]  °[place] 1-800-659-3381 Substance Abuse/Addiction Treatment  ° °[place] Behavioral Health Resources °Organization         Address  Phone  Notes  °[facility]  (888) 581-9988   °Julie Brannon, PhD [address] [place], [place]   (336) 349-5553 or (336) 951-0000   °[facility]   [address] °[place], [place] (336) 349-4454   °[facility] [address], [place], [place] (336) 342-8316 Insurance/Medicaid/sponsorship through [facility]  °[facility] [address]                                    [place], [place] (336) 342-8316 Therapy/tele-psych/case  °[facility] [address]  ° [place], [place] (336) 349-2233    °Dr. Arfeen  (336) 349-4544   °[facility] [place]  [employer] [place] [facility] 1) [address], [place] °2) [address], [place] °3)  [address], [place] (336) 349-3220 °(336) 342-7768 ° °(336) 342-8140   °[place] [facility] (336) 342-1394 or (336) 342-3537 (After Hours)    ° ° ° ° °

## 2014-03-09 NOTE — ED Notes (Signed)
MD at bedside. 

## 2014-03-09 NOTE — ED Provider Notes (Signed)
CSN: [id number]     Arrival date & time 03/09/14  2058 History  This chart was scribed for Charlesetta Shanks, MD by Randa Evens, ED Scribe. This patient was seen in room MH09/MH09 and the patient's care was started at 10:13 PM.    Chief Complaint  Patient presents with  . Urinary Urgency    The history is provided by the patient. No language interpreter was used.   HPI Comments: [name] is a 63 y.o. female who presents to the Emergency Department complaining of UTI symptoms onset 1 day ago. She states she is having right flank pain, dysuria and chills. She states she has hx of UTI's. Pt states that her symptoms today feel similar to previous UTI. The patient reports that she has had kidney stones in the past and she reports "this is not a kidney stone." States she has a hx of anxiety. She states she has HA, dizziness and nausea from a panic attack that has now resolved.   Denies fever or other related symptoms.    Past Medical History  Diagnosis Date  . Diabetes mellitus without complication   . Hypertension   . PTSD (post-traumatic stress disorder)   . Anxiety    Past Surgical History  Procedure Laterality Date  . Cholecystectomy    . Abdominal hysterectomy    . Back surgery     No family history on file. History  Substance Use Topics  . Smoking status: Never Smoker   . Smokeless tobacco: Not on file  . Alcohol Use: No   OB History    No data available     Review of Systems  Constitutional: Positive for chills. Negative for fever.  Genitourinary: Positive for dysuria and flank pain.   10 Systems reviewed and are negative for acute change except as noted in the HPI.   Allergies  Review of patient's allergies indicates no known allergies.  Home Medications   Prior to Admission medications   Medication Sig Start Date End Date Taking? Authorizing Provider  clonazePAM (KLONOPIN) 1 MG tablet Take 1 mg by mouth 2 (two) times daily.   Yes Historical Provider, MD   gabapentin (NEURONTIN) 300 MG capsule Take 300 mg by mouth 3 (three) times daily.   Yes Historical Provider, MD  rosuvastatin (CRESTOR) 20 MG tablet Take 20 mg by mouth daily.   Yes Historical Provider, MD  amoxicillin-clavulanate (AUGMENTIN) 875-125 MG per tablet Take 1 tablet by mouth 2 (two) times daily. One po bid x 7 days 03/09/14   Charlesetta Shanks, MD  HYDROcodone-acetaminophen (NORCO/VICODIN) 5-325 MG per tablet Take 1-2 tablets by mouth every 6 (six) hours as needed for moderate pain. 10/11/13   Merryl Hacker, MD  phenazopyridine (PYRIDIUM) 200 MG tablet Take 1 tablet (200 mg total) by mouth 3 (three) times daily as needed for pain. 03/09/14   Charlesetta Shanks, MD  zolpidem (AMBIEN) 5 MG tablet Take 5 mg by mouth at bedtime as needed for sleep.    Historical Provider, MD   Triage Vitals: BP 159/97 mmHg  Pulse 90  Temp(Src) 97.7 F (36.5 C) (Oral)  Resp 18  Ht 5\' 8"  (1.727 m)  Wt 159 lb (72.122 kg)  BMI 24.18 kg/m2  SpO2 100%  Physical Exam  Constitutional: She is oriented to person, place, and time. She appears well-developed and well-nourished. No distress.  HENT:  Head: Normocephalic and atraumatic.  Eyes: EOM are normal.  Neck: Neck supple.  Cardiovascular: Normal rate, regular rhythm and normal  heart sounds.   Pulmonary/Chest: Effort normal and breath sounds normal. No respiratory distress. She has no wheezes. She has no rales.  Abdominal: Soft. Bowel sounds are normal. She exhibits no distension and no mass. Tenderness: mild suprapubic tenderness. There is no rebound and no guarding.  Musculoskeletal: She exhibits no edema or tenderness.  Neurological: She is alert and oriented to person, place, and time. Coordination normal.  Skin: Skin is warm and dry. She is not diaphoretic.  Psychiatric: She has a normal mood and affect.    ED Course  Procedures (including critical care time) DIAGNOSTIC STUDIES: Oxygen Saturation is 100% on RA, normal by my interpretation.     COORDINATION OF CARE: 10:19 PM-Discussed treatment plan which includes antibiotics with pt at bedside and pt agreed to plan.     Labs Review Labs Reviewed  URINALYSIS, ROUTINE W REFLEX MICROSCOPIC - Abnormal; Notable for the following:    Glucose, UA >1000 (*)    Hgb urine dipstick SMALL (*)    Nitrite POSITIVE (*)    Leukocytes, UA SMALL (*)    All other components within normal limits  URINE MICROSCOPIC-ADD ON - Abnormal; Notable for the following:    Bacteria, UA MANY (*)    All other components within normal limits  CBG MONITORING, ED    Imaging Review No results found.   EKG Interpretation None      MDM   Final diagnoses:  UTI (lower urinary tract infection)   Urinalysis is positive. The patient has symptoms consistent with UTI. She does not believe this is associated with kidney stone which she has had in the past. At this time treatment of UTI will be initiated with instructions for signs and symptoms with which to return. The patient is nontoxic alert and well appearance.    I personally performed the services described in this documentation, which was scribed in my presence. The recorded information has been reviewed and is accurate.}     Charlesetta Shanks, MD 03/10/14 1550

## 2014-04-13 DIAGNOSIS — F331 Major depressive disorder, recurrent, moderate: Secondary | ICD-10-CM | POA: Diagnosis not present

## 2014-04-20 DIAGNOSIS — J019 Acute sinusitis, unspecified: Secondary | ICD-10-CM | POA: Diagnosis not present

## 2014-05-09 DIAGNOSIS — F331 Major depressive disorder, recurrent, moderate: Secondary | ICD-10-CM | POA: Diagnosis not present

## 2014-05-11 DIAGNOSIS — E119 Type 2 diabetes mellitus without complications: Secondary | ICD-10-CM | POA: Diagnosis not present

## 2014-05-11 DIAGNOSIS — H2513 Age-related nuclear cataract, bilateral: Secondary | ICD-10-CM | POA: Diagnosis not present

## 2014-05-11 DIAGNOSIS — F331 Major depressive disorder, recurrent, moderate: Secondary | ICD-10-CM | POA: Diagnosis not present

## 2014-05-11 DIAGNOSIS — H16223 Keratoconjunctivitis sicca, not specified as Sjogren's, bilateral: Secondary | ICD-10-CM | POA: Diagnosis not present

## 2014-05-23 DIAGNOSIS — F331 Major depressive disorder, recurrent, moderate: Secondary | ICD-10-CM | POA: Diagnosis not present

## 2014-06-05 ENCOUNTER — Emergency Department (HOSPITAL_COMMUNITY)
Admission: EM | Admit: 2014-06-05 | Discharge: 2014-06-05 | Disposition: A | Discharge status: Home / Self Care | Payer: Medicare Other | Attending: Emergency Medicine | Admitting: Emergency Medicine

## 2014-06-05 ENCOUNTER — Emergency Department (HOSPITAL_COMMUNITY): Payer: Medicare Other

## 2014-06-05 ENCOUNTER — Encounter (HOSPITAL_COMMUNITY): Payer: Self-pay | Admitting: *Deleted

## 2014-06-05 DIAGNOSIS — F419 Anxiety disorder, unspecified: Secondary | ICD-10-CM | POA: Insufficient documentation

## 2014-06-05 DIAGNOSIS — R410 Disorientation, unspecified: Secondary | ICD-10-CM | POA: Diagnosis not present

## 2014-06-05 DIAGNOSIS — E119 Type 2 diabetes mellitus without complications: Secondary | ICD-10-CM | POA: Diagnosis not present

## 2014-06-05 DIAGNOSIS — R51 Headache: Secondary | ICD-10-CM | POA: Insufficient documentation

## 2014-06-05 DIAGNOSIS — R519 Headache, unspecified: Secondary | ICD-10-CM

## 2014-06-05 DIAGNOSIS — Z79899 Other long term (current) drug therapy: Secondary | ICD-10-CM | POA: Insufficient documentation

## 2014-06-05 DIAGNOSIS — Z792 Long term (current) use of antibiotics: Secondary | ICD-10-CM | POA: Diagnosis not present

## 2014-06-05 DIAGNOSIS — R112 Nausea with vomiting, unspecified: Secondary | ICD-10-CM | POA: Insufficient documentation

## 2014-06-05 DIAGNOSIS — F439 Reaction to severe stress, unspecified: Secondary | ICD-10-CM | POA: Diagnosis not present

## 2014-06-05 LAB — BASIC METABOLIC PANEL
Anion gap: 6 (ref 5–15)
BUN: 14 mg/dL (ref 6–23)
CO2: 26 mmol/L (ref 19–32)
Calcium: 9.2 mg/dL (ref 8.4–10.5)
Chloride: 108 mmol/L (ref 96–112)
Creatinine, Ser: 0.77 mg/dL (ref 0.50–1.10)
GFR calc Af Amer: >90 mL/min (ref >90)
GFR calc non Af Amer: 88 mL/min — ABNORMAL LOW (ref >90)
Glucose, Bld: 131 mg/dL — ABNORMAL HIGH (ref 70–99)
Potassium: 3.5 mmol/L (ref 3.5–5.1)
Sodium: 140 mmol/L (ref 135–145)

## 2014-06-05 LAB — CBC WITH DIFFERENTIAL/PLATELET
Basophils Absolute: 0 10*3/uL (ref 0.0–0.1)
Basophils Relative: 1 % (ref 0–1)
Eosinophils Absolute: 0.1 10*3/uL (ref 0.0–0.7)
Eosinophils Relative: 3 % (ref 0–5)
HCT: 39.2 % (ref 36.0–46.0)
Hemoglobin: 13.9 g/dL (ref 12.0–15.0)
Lymphocytes Relative: 38 % (ref 12–46)
Lymphs Abs: 1.8 10*3/uL (ref 0.7–4.0)
MCH: 32.7 pg (ref 26.0–34.0)
MCHC: 35.5 g/dL (ref 30.0–36.0)
MCV: 92.2 fL (ref 78.0–100.0)
Monocytes Absolute: 0.6 10*3/uL (ref 0.1–1.0)
Monocytes Relative: 13 % — ABNORMAL HIGH (ref 3–12)
Neutro Abs: 2.1 10*3/uL (ref 1.7–7.7)
Neutrophils Relative %: 45 % (ref 43–77)
Platelets: 224 10*3/uL (ref 150–400)
RBC: 4.25 MIL/uL (ref 3.87–5.11)
RDW: 11.9 % (ref 11.5–15.5)
WBC: 4.6 10*3/uL (ref 4.0–10.5)

## 2014-06-05 MED ORDER — NICOTINE 21 MG/24HR TD PT24: 21.0000 mg | MEDICATED_PATCH | Freq: Every day | TRANSDERMAL | Status: DC

## 2014-06-05 MED ORDER — ACETAMINOPHEN 325 MG PO TABS: 650.0000 mg | ORAL_TABLET | ORAL | Status: DC | PRN

## 2014-06-05 MED ORDER — ZOLPIDEM TARTRATE 5 MG PO TABS: 5.0000 mg | ORAL_TABLET | Freq: Every evening | ORAL | Status: DC | PRN

## 2014-06-05 MED ORDER — IBUPROFEN 200 MG PO TABS: 600.0000 mg | ORAL_TABLET | Freq: Three times a day (TID) | ORAL | Status: DC | PRN

## 2014-06-05 MED ORDER — ONDANSETRON HCL 4 MG PO TABS: 4.0000 mg | ORAL_TABLET | Freq: Three times a day (TID) | ORAL | Status: DC | PRN

## 2014-06-05 MED ORDER — ALUM & MAG HYDROXIDE-SIMETH 200-200-20 MG/5ML PO SUSP: 30.0000 mL | ORAL | Status: DC | PRN

## 2014-06-05 MED ORDER — ONDANSETRON HCL 4 MG/2ML IJ SOLN
4.0000 mg | Freq: Once | INTRAMUSCULAR | Status: AC
  Administered 2014-06-05: 4 mg via INTRAVENOUS
  Filled 2014-06-05: qty 2

## 2014-06-05 MED ORDER — LORAZEPAM 2 MG/ML IJ SOLN
1.0000 mg | Freq: Once | INTRAMUSCULAR | Status: AC
  Administered 2014-06-05: 1 mg via INTRAVENOUS
  Filled 2014-06-05: qty 1

## 2014-06-05 NOTE — ED Notes (Signed)
Per notes, psychiatry requesting pt to be placed in facility. Spoke with pt regarding plan of care.  Pt adamant about going home due to obligations with family.  Pt denying SI/HI.  PA made aware. Will come speak with pt.

## 2014-06-05 NOTE — BH Assessment (Signed)
Writer informed of the consult.  

## 2014-06-05 NOTE — ED Notes (Signed)
Noland Fordyce, PA at bedside.

## 2014-06-05 NOTE — BH Assessment (Signed)
Patient has been referred to [facility], [place], [place] and [place].

## 2014-06-05 NOTE — ED Notes (Signed)
TTS on bedside.

## 2014-06-05 NOTE — ED Provider Notes (Signed)
CSN: [id number]     Arrival date & time 06/05/14  85 History   First MD Initiated Contact with Patient 06/05/14 1603     Chief Complaint  Patient presents with  . Blurred Vision  . Numbness  . Headache     (Consider location/radiation/quality/duration/timing/severity/associated sxs/prior Treatment) HPI Pt is a 64yo female with hx of DM, HTN, PTSD, and anxiety for which she takes neurontin and klonopin, presenting to ED with c/o sudden onset generalized headache about 86min prior to arrival with associated numbness of her mouth and left hand. Reports hx of headaches in the past but "never this bad."  Pain is so bad, pt is tearful.  No pain medication PTA.  Pt states she was in the grocery store when her symptoms started.  Vomited once, c/o continued nausea.  HA has caused pt to hyperventilate, c/o SOB.  Reports having a lot of stress with her mother and states "I just can't take it any more, I'm so tired."  Reports feeling confused and states she had difficulty finding the hospital as she drove herself to the ED.  Denies recent change in her medications. Denies recent head trauma. No previous hx of stroke. No dx of migraines. Denies SI/HI. Denies alcohol or drug use.  Past Medical History  Diagnosis Date  . Diabetes mellitus without complication   . Hypertension   . PTSD (post-traumatic stress disorder)   . Anxiety    Past Surgical History  Procedure Laterality Date  . Cholecystectomy    . Abdominal hysterectomy    . Back surgery     No family history on file. History  Substance Use Topics  . Smoking status: Never Smoker   . Smokeless tobacco: Not on file  . Alcohol Use: No   OB History    No data available     Review of Systems  Respiratory: Positive for shortness of breath. Negative for cough.   Cardiovascular: Negative for chest pain and palpitations.  Gastrointestinal: Positive for nausea and vomiting ( x1). Negative for abdominal pain.  Neurological: Positive for  weakness ( generalized), numbness ( pins and needles in left hand, numbness of mouth) and headaches ( generalized). Negative for dizziness, seizures, speech difficulty and light-headedness.  Psychiatric/Behavioral: Negative for suicidal ideas and self-injury. The patient is nervous/anxious.   All other systems reviewed and are negative.     Allergies  Review of patient's allergies indicates no known allergies.  Home Medications   Prior to Admission medications   Medication Sig Start Date End Date Taking? Authorizing Provider  clonazePAM (KLONOPIN) 1 MG tablet Take 1 mg by mouth 2 (two) times daily.   Yes Historical Provider, MD  gabapentin (NEURONTIN) 300 MG capsule Take 300 mg by mouth 3 (three) times daily.   Yes Historical Provider, MD  rosuvastatin (CRESTOR) 20 MG tablet Take 20 mg by mouth daily.   Yes Historical Provider, MD  zolpidem (AMBIEN) 5 MG tablet Take 5 mg by mouth at bedtime as needed for sleep.   Yes Historical Provider, MD  amoxicillin-clavulanate (AUGMENTIN) 875-125 MG per tablet Take 1 tablet by mouth 2 (two) times daily. One po bid x 7 days Patient not taking: Reported on 06/05/2014 03/09/14   Charlesetta Shanks, MD  HYDROcodone-acetaminophen (NORCO/VICODIN) 5-325 MG per tablet Take 1-2 tablets by mouth every 6 (six) hours as needed for moderate pain. Patient not taking: Reported on 06/05/2014 10/11/13   Merryl Hacker, MD  phenazopyridine (PYRIDIUM) 200 MG tablet Take 1 tablet (200 mg total)  by mouth 3 (three) times daily as needed for pain. Patient not taking: Reported on 06/05/2014 03/09/14   Charlesetta Shanks, MD   BP 169/102 mmHg  Pulse 87  Temp(Src) 98.5 F (36.9 C) (Oral)  Resp 19  SpO2 100% Physical Exam  Constitutional: She is oriented to person, place, and time. She appears well-developed and well-nourished. No distress.  HENT:  Head: Normocephalic and atraumatic.  Eyes: Conjunctivae and EOM are normal. Pupils are equal, round, and reactive to light. No scleral  icterus.  Neck: Normal range of motion. Neck supple.  Cardiovascular: Normal rate, regular rhythm and normal heart sounds.   Initially tachycardic in triage, HR improved to regular rate during exam.  Pulmonary/Chest: Effort normal and breath sounds normal. No respiratory distress. She has no wheezes. She has no rales. She exhibits no tenderness.  Abdominal: Soft. Bowel sounds are normal. She exhibits no distension and no mass. There is no tenderness. There is no rebound and no guarding.  Musculoskeletal: Normal range of motion.  Neurological: She is alert and oriented to person, place, and time.  Reflex Scores:      Patellar reflexes are 2+ on the right side and 2+ on the left side. CN II-XII in tact. Alert to person, place and time. Anxious, needs to be redirected back on topic. Decreased sensation to left hand with light and sharp touch as well as left leg. 5/5 strength in upper extremities. 4/5 strength left lower extremity compared to right. Decreased ROM left leg.  Initially would not raise left leg off bed, when encouraged, able to lift left light 2-3 inches off bed. 2+ petellar reflex of left leg.   Unsafe to ambulate pt upon initial exam as RN states pt was "dead weight" with 2 person assist from wheelchair into exam bed.  Skin: Skin is warm and dry. She is not diaphoretic.  Psychiatric: Her mood appears anxious.  tearful  Nursing note and vitals reviewed.   ED Course  Procedures (including critical care time) Labs Review Labs Reviewed  BASIC METABOLIC PANEL - Abnormal; Notable for the following:    Glucose, Bld 131 (*)    GFR calc non Af Amer 88 (*)    All other components within normal limits  CBC WITH DIFFERENTIAL/PLATELET - Abnormal; Notable for the following:    Monocytes Relative 13 (*)    All other components within normal limits    Imaging Review Ct Head Wo Contrast  06/05/2014   CLINICAL DATA:  Sudden onset headache this afternoon, numbness around mouth and in LEFT  hand, hyperventilation in triage, stress confusion, blurred vision, history diabetes, hypertension  EXAM: CT HEAD WITHOUT CONTRAST  TECHNIQUE: Contiguous axial images were obtained from the base of the skull through the vertex without intravenous contrast.  COMPARISON:  None  FINDINGS: Normal ventricular morphology.  No midline shift or mass effect.  Normal appearance of brain parenchyma.  No intracranial hemorrhage, mass lesion, or acute infarction.  Visualized paranasal sinuses and mastoid air cells clear.  Bones unremarkable.  Atherosclerotic calcifications throughout the carotid siphons.  IMPRESSION: No acute intracranial abnormalities.   Electronically Signed   By: Lavonia Dana M.D.   On: 06/05/2014 16:56     EKG Interpretation None      MDM   Final diagnoses:  Generalized headache  Anxiety  Stress   Pt with hx of anxiety and PTSD presenting to ED with c/o headache and left sided numbness and weakness. Pt has decreased sensation and movement of left hand and  left lower extremity.  Pt is able to move left leg when encouraged. Due to sudden onset of severe headache, CT scan performed to r/o SAH, however, symptoms likely anxiety induced as pt appears very anxious, tearful. States she is under a lot of stress caring for her mother.  Tx in ED: zofran and ativan.   5:01 PM Pt back from CT.  CT imaging- no acute intracranial abnormalities. Pt states HA has improved. Pt mumbling and still tearful c/o being cold. Blanket provided to pt.   8:57 PM  Pt states she is feeling much better. Pt is much calmer.  Eating crackers and drinking water.  Pt states HA has resolved. Pt ready to go home. States she believes "everything" the stress of taking care of her mother "caught up to her"  Per notes from [facility], pt is to stay, will contract to safety and referred to [place], [place], [place] and [place].  Per recommendation of  [facility] counselor, Ava Lennie Odor, psych hold orders  placed and additional medical screening labs placed.   9:50 PM Pt requesting to be discharged home. Pt able to ambulate to restroom w/o assistance. states she is feeling much better. Pt will contract for safety. State she needs to go home tonight as her mother has a f/u appointment for bloodwork at [facility]. Mother has Alzheimer's disease and does not have any other means of transportation.  Pt reports having close f/u with her PCP as well as counselors at [facility].  Pt also evaluated by Dr. Vanita Panda, agrees pt is safe for discharge home as she does not pose an immediate threat to herself or others at this time. IVC is not indicated.    Noland Fordyce, PA-C 06/05/14 2227  Carmin Muskrat, MD 06/06/14 [phone]

## 2014-06-05 NOTE — ED Notes (Addendum)
Pt reports sudden onset of headache 45 minutes ago, numbness around mouth, numbness in left hand, pt hyperventilating in triage, very tearful. States has had a lot of stress with mom. Equal grips, no facial droop, no arm drift, no slurred speech. States while driving had confusion, couldn't find hospital.

## 2014-06-05 NOTE — Progress Notes (Signed)
CSW followed up on the pt referrals:  [name] is at capacity tonight but will have discharges in the morning. Per Altha Harm, refax pt referral tomorrow morning. [place] - no answer [facility] - no answer [place] - confirmed that pt referral is under review [facility] - confirmed that it is under review  Will continue to seek placement.  Verlon Setting, [place] Disposition staff 06/05/2014 9:21 PM

## 2014-06-05 NOTE — ED Notes (Signed)
Pt back from CT

## 2014-06-05 NOTE — ED Notes (Signed)
No harm contract signed by pt at discharge.  Verbally agreed to not harm self at discharge.  Still denying SI/HI.  Verbally stated that if pt started feeling anxious or had harmful thoughts she would return for further evaluation.

## 2014-06-05 NOTE — BH Assessment (Addendum)
Tele Assessment Note   Patient is a 64yo white female with a history of Anxiety Disorder and PTSD.  Patient reports in 07/29/2005 two of her sons died within five months of each other.  Patient reports that one of her sons died of a suicide and the other son died of a heart attack.   In Jul 30, 2006 her brother committed suicide with a knife and the patient found his body.  In Jul 30, 2007 her sister died of an overdose of morphine.  In 07-29-13, the patient had to move back to [facility] to take care of mother that has Alzheimer's.   Patient reports that she does not have any more family members alive to help her with her 29 year old mother.  Patient was tearful throughout the assessment.  Patient reports that she lives alone and she goes to her mother home every day to help her mother.  Patient reports that even when she is she is not at her mother's home, her mother will call her at all times of the day and night.  Patient reports that she is so stressed out because she has to place her mother in a, "assisted living facility"  Patient denies SI/HI/Psychosis.  Patient reports that she is able to contract for safety. Patient denies substance abuse.  Patient reports that she is compliant with taking her medication from [facility] (neurontin and klonopin).   Patient denies prior psychiatric hospitalizations.  Patient denies prior substance abuse treatment and detox.     Axis I: Anxiety Disorder NOS and PTSD Axis II: Deferred Axis III:  Past Medical History  Diagnosis Date  . Diabetes mellitus without complication   . Hypertension   . PTSD (post-traumatic stress disorder)   . Anxiety    Axis IV: economic problems, other psychosocial or environmental problems, problems related to social environment, problems with access to health care services and problems with primary support group Axis V: 41-50 serious symptoms  Past Medical History:  Past Medical History  Diagnosis Date  . Diabetes mellitus without complication   .  Hypertension   . PTSD (post-traumatic stress disorder)   . Anxiety     Past Surgical History  Procedure Laterality Date  . Cholecystectomy    . Abdominal hysterectomy    . Back surgery      Family History: No family history on file.  Social History:  reports that she has never smoked. She does not have any smokeless tobacco history on file. She reports that she does not drink alcohol or use illicit drugs.  Additional Social History:     CIWA: CIWA-Ar BP: 166/75 mmHg Pulse Rate: 84 COWS:    PATIENT STRENGTHS: (choose at least two) Ability for insight Average or above average intelligence Child psychotherapist Motivation for treatment/growth  Allergies: No Known Allergies  Home Medications:  (Not in a hospital admission)  OB/GYN Status:  No LMP recorded. Patient has had a hysterectomy.  General Assessment Data Location of Assessment: [facility] ED Is this a Tele or Face-to-Face Assessment?: Tele Assessment Is this an Initial Assessment or a Re-assessment for this encounter?: Initial Assessment Living Arrangements: Alone Can pt return to current living arrangement?: Yes Admission Status: Voluntary Is patient capable of signing voluntary admission?: Yes Transfer from: Home Referral Source: Self/Family/Friend  Medical Screening Exam ([place]) Medical Exam completed: Yes  [place] Living Arrangements: Alone Name of Psychiatrist: Beverly Sessions  Name of Therapist: Beverly Sessions   Education Status Is patient currently in school?: No Current  Grade: NA Highest grade of school patient has completed: NA Name of school: NA Contact person: NA  Risk to self with the past 6 months Suicidal Ideation: No Suicidal Intent: No Is patient at risk for suicide?: No Suicidal Plan?: No Access to Means: No What has been your use of drugs/alcohol within the last 12 months?: NA Previous Attempts/Gestures: No How many times?: 0 Other Self Harm Risks:  NA Triggers for Past Attempts:  (NA) Intentional Self Injurious Behavior: None Family Suicide History: No Recent stressful life event(s): Other (Comment) (Trauma of the death of family members since 07/15/05.) Persecutory voices/beliefs?: No Depression: Yes Depression Symptoms: Insomnia, Fatigue, Guilt, Feeling worthless/self pity Substance abuse history and/or treatment for substance abuse?: No Suicide prevention information given to non-admitted patients: Not applicable  Risk to Others within the past 6 months Homicidal Ideation: No Thoughts of Harm to Others: No Current Homicidal Intent: No Current Homicidal Plan: No Access to Homicidal Means: No Identified Victim: NA History of harm to others?: No Assessment of Violence: None Noted Violent Behavior Description: NA Does patient have access to weapons?: No Criminal Charges Pending?: No Does patient have a court date: No  Psychosis Hallucinations: None noted Delusions: None noted  Mental Status Report Appear/Hygiene: Disheveled Eye Contact: Good Motor Activity: Freedom of movement Speech: Logical/coherent Level of Consciousness: Alert Mood: Depressed, Anxious Affect: Anxious Anxiety Level: Minimal Thought Processes: Coherent, Relevant Judgement: Unimpaired Orientation: Person, Place, Time, Situation Obsessive Compulsive Thoughts/Behaviors: None  Cognitive Functioning Concentration: Decreased Memory: Recent Intact, Remote Intact IQ: Average Insight: Good Impulse Control: Good Appetite: Fair Weight Loss: 0 Weight Gain: 0 Sleep: No Change Total Hours of Sleep: 8 Vegetative Symptoms: Staying in bed  ADLScreening [facility]) Patient's cognitive ability adequate to safely complete daily activities?: Yes Patient able to express need for assistance with ADLs?: Yes Independently performs ADLs?: Yes (appropriate for developmental age)  Prior Inpatient Therapy Prior Inpatient Therapy: No Prior Therapy  Dates: NA Prior Therapy Facilty/Provider(s): NA Reason for Treatment: NA  Prior Outpatient Therapy Prior Outpatient Therapy: Yes Prior Therapy Dates: Ongoing  Prior Therapy Facilty/Provider(s): NA Reason for Treatment: NA  ADL Screening (condition at time of admission) Patient's cognitive ability adequate to safely complete daily activities?: Yes Patient able to express need for assistance with ADLs?: Yes Independently performs ADLs?: Yes (appropriate for developmental age)                  Additional Information 1:1 In Past 12 Months?: No CIRT Risk: No Elopement Risk: No Does patient have medical clearance?: Yes     Disposition:  Disposition Initial Assessment Completed for this Encounter: Yes Disposition of Patient: Referred to Patient referred to:  (Pending psych disposition.)  Graciella Freer [place] 06/05/2014 6:09 PM

## 2014-06-07 ENCOUNTER — Ambulatory Visit (INDEPENDENT_AMBULATORY_CARE_PROVIDER_SITE_OTHER): Payer: Medicare Other | Admitting: Podiatrist

## 2014-06-07 ENCOUNTER — Ambulatory Visit (INDEPENDENT_AMBULATORY_CARE_PROVIDER_SITE_OTHER): Payer: Medicare Other

## 2014-06-07 ENCOUNTER — Encounter: Payer: Self-pay | Admitting: Podiatrist

## 2014-06-07 VITALS — BP 116/66 | HR 66 | Resp 12 | Ht 68.0 in | Wt 155.0 lb

## 2014-06-07 DIAGNOSIS — M21612 Bunion of left foot: Principal | ICD-10-CM

## 2014-06-07 DIAGNOSIS — L600 Ingrowing nail: Secondary | ICD-10-CM | POA: Diagnosis not present

## 2014-06-07 DIAGNOSIS — L84 Corns and callosities: Secondary | ICD-10-CM | POA: Diagnosis not present

## 2014-06-07 DIAGNOSIS — M2011 Hallux valgus (acquired), right foot: Secondary | ICD-10-CM

## 2014-06-07 DIAGNOSIS — M21611 Bunion of right foot: Secondary | ICD-10-CM

## 2014-06-07 DIAGNOSIS — M2012 Hallux valgus (acquired), left foot: Secondary | ICD-10-CM

## 2014-06-07 MED ORDER — AMOXICILLIN-POT CLAVULANATE 875-125 MG PO TABS: 1.0000 | ORAL_TABLET | Freq: Two times a day (BID) | ORAL | Status: DC

## 2014-06-07 NOTE — Patient Instructions (Addendum)
Soak Instructions    THE DAY AFTER THE PROCEDURE  Place 1/4 cup of epsom salts in a quart of warm tap water.  Submerge your foot or feet with outer bandage intact for the initial soak; this will allow the bandage to become moist and wet for easy lift off.  Once you remove your bandage, continue to soak in the solution for 20 minutes.  This soak should be done twice a day.  Next, remove your foot or feet from solution, blot dry the affected area and cover.  You may use a band aid large enough to cover the area or use gauze and tape.  Apply other medications to the area as directed by the doctor such as polysporin neosporin.  IF YOUR SKIN BECOMES IRRITATED WHILE USING THESE INSTRUCTIONS, IT IS OKAY TO SWITCH TO  WHITE VINEGAR AND WATER. Or you may use antibacterial soap and water to keep the toe clean      [place] Instructions-Post Nail Surgery  You have had your ingrown toenail and root treated with a chemical.  This chemical causes a burn that will drain and ooze like a blister.  1-2 weeks after the procedure you may leave the area open to air at night to help dry it up.  During the day,  It is important to keep this area clean and covered until the toe dries out and forms a scab. Once the scab forms you no longer need to soak or apply a dressing.  If at any time you experience an increase in pain, redness, swelling, or drainage, you should contact the office as soon as possible.

## 2014-06-07 NOTE — Progress Notes (Signed)
   Subjective:    Patient ID: [name], female    DOB: 19-Sep-1950, 64 y.o.   MRN: [id number]  HPI  Chief Complaint  Patient presents with  . Ingrown Toenail    "I have a very sore ingrown toenail on my right great toe."  right 1st medial toenail border.  Pt states she has tried to perform toenail removal on the right 1st toe.  . Bunions    "I have these painful protrusion on my big toes." B/L 1st MPJ  . Callouses    "And I have these callouses that I take care of myself but I want them looked at too." B/L 1st medial toe and heels, that pt states she uses multiple lotions and scraping impliments.        Review of Systems  Constitutional:       Pt states she has lost weight due to managing her diet and her new diabetes medication.  All other systems reviewed and are negative.      Objective:   Physical Exam Patient is awake, alert, and oriented x 3.  In no acute distress.  Vascular status is intact with palpable pedal pulses at 2/4 DP and PT bilateral and capillary refill time within normal limits. Neurological sensation is also intact bilaterally via Semmes Weinstein monofilament at 5/5 sites. Light touch, vibratory sensation, Achilles tendon reflex is intact right, she has had a "torn Achilles tendon left.. He has numbness on the lateral and dorsal aspect of the left foot where the Achilles tendon affected the nerves. Dermatological exam reveals right great toenail wasn't comfortable on the medial aspect. It ingrown and inflamed. No sign of infection is present. Previous removal of the left medial side of the great toenail is noted to have occurred in the past. She is done well from this procedure. Hyperkeratotic lesions are present bilateral feet. No sign of infection is present.  Musculature intact with dorsiflexion, plantarflexion, inversion, eversion. Bunion deformity is present bilateral and is moderately symptomatic.      Assessment & Plan:  Ingrown toenail right first  medial nail border, bilateral bunions, hyperkeratotic lesion bilateral.  Treatment options and alternatives discussed.  Recommended permanent phenol matrixectomy and patient agreed.  right was prepped with alcohol and a 1 to 1 mix of 0.5% marcaine plain and 2% lidocaine plain was administered in a digital block fashion.  The toe was then prepped with betadine solution and exsanguinated.  The offending medial nail border was then excised and matrix tissue exposed.  Phenol was then applied to the matrix tissue followed by an alcohol wash.  Antibiotic ointment and a dry sterile dressing was applied.  The patient was dispensed instructions for aftercare.  Discuss surgery further bunions however they're not bothersome therefore I recommended waiting. She does a good job at trimming down the hyperkeratotic lesions and recommended continued home treatment until she is unable to and that we'll be happy to take care of her treatment for these. Prescription for Augmentin was called into [facility] for her to start taking after the toenail procedure. If she has any concerns or questions she is instructed to call.

## 2014-06-08 DIAGNOSIS — K219 Gastro-esophageal reflux disease without esophagitis: Secondary | ICD-10-CM | POA: Diagnosis not present

## 2014-06-08 DIAGNOSIS — J309 Allergic rhinitis, unspecified: Secondary | ICD-10-CM | POA: Diagnosis not present

## 2014-06-08 DIAGNOSIS — E785 Hyperlipidemia, unspecified: Secondary | ICD-10-CM | POA: Diagnosis not present

## 2014-06-08 DIAGNOSIS — L600 Ingrowing nail: Secondary | ICD-10-CM | POA: Diagnosis not present

## 2014-06-08 DIAGNOSIS — I10 Essential (primary) hypertension: Secondary | ICD-10-CM | POA: Diagnosis not present

## 2014-06-08 DIAGNOSIS — F418 Other specified anxiety disorders: Secondary | ICD-10-CM | POA: Diagnosis not present

## 2014-06-08 DIAGNOSIS — E1165 Type 2 diabetes mellitus with hyperglycemia: Secondary | ICD-10-CM | POA: Diagnosis not present

## 2014-06-16 ENCOUNTER — Emergency Department (HOSPITAL_COMMUNITY)
Admission: EM | Admit: 2014-06-16 | Discharge: 2014-06-16 | Disposition: A | Discharge status: Home / Self Care | Payer: Medicare Other | Attending: Emergency Medicine | Admitting: Emergency Medicine

## 2014-06-16 ENCOUNTER — Emergency Department (HOSPITAL_COMMUNITY): Payer: Medicare Other

## 2014-06-16 ENCOUNTER — Encounter (HOSPITAL_COMMUNITY): Payer: Self-pay | Admitting: Emergency Medicine

## 2014-06-16 DIAGNOSIS — Y998 Other external cause status: Secondary | ICD-10-CM | POA: Diagnosis not present

## 2014-06-16 DIAGNOSIS — Y93E1 Activity, personal bathing and showering: Secondary | ICD-10-CM | POA: Insufficient documentation

## 2014-06-16 DIAGNOSIS — E119 Type 2 diabetes mellitus without complications: Secondary | ICD-10-CM | POA: Insufficient documentation

## 2014-06-16 DIAGNOSIS — W19XXXA Unspecified fall, initial encounter: Secondary | ICD-10-CM

## 2014-06-16 DIAGNOSIS — Z792 Long term (current) use of antibiotics: Secondary | ICD-10-CM | POA: Diagnosis not present

## 2014-06-16 DIAGNOSIS — Y92091 Bathroom in other non-institutional residence as the place of occurrence of the external cause: Secondary | ICD-10-CM | POA: Insufficient documentation

## 2014-06-16 DIAGNOSIS — W01198A Fall on same level from slipping, tripping and stumbling with subsequent striking against other object, initial encounter: Secondary | ICD-10-CM | POA: Diagnosis not present

## 2014-06-16 DIAGNOSIS — F419 Anxiety disorder, unspecified: Secondary | ICD-10-CM | POA: Insufficient documentation

## 2014-06-16 DIAGNOSIS — Z79899 Other long term (current) drug therapy: Secondary | ICD-10-CM | POA: Insufficient documentation

## 2014-06-16 DIAGNOSIS — S0990XA Unspecified injury of head, initial encounter: Secondary | ICD-10-CM | POA: Insufficient documentation

## 2014-06-16 DIAGNOSIS — Z880 Allergy status to penicillin: Secondary | ICD-10-CM | POA: Diagnosis not present

## 2014-06-16 DIAGNOSIS — W182XXA Fall in (into) shower or empty bathtub, initial encounter: Secondary | ICD-10-CM | POA: Insufficient documentation

## 2014-06-16 DIAGNOSIS — I10 Essential (primary) hypertension: Secondary | ICD-10-CM | POA: Insufficient documentation

## 2014-06-16 NOTE — ED Provider Notes (Signed)
CSN: [id number]     Arrival date & time 06/16/14  1508 History   First MD Initiated Contact with Patient 06/16/14 1700     Chief Complaint  Patient presents with  . Fall     (Consider location/radiation/quality/duration/timing/severity/associated sxs/prior Treatment) HPI  Pt presenting with c/o fall in the bathtub last night.  She states she slipped and fell hitting her lower chest on side of tub and her head on the commode.  She denies LOC, she has been feeling nauseated.  This resolved with zofran she took at home.  She has also been having blurry vision throughout the day today.  No vomiting or seizure activity.  She denies significant pain in chest wall.  No difficulty breathing.  No LOC or syncope.  She states she slipped on soap.  She was seen at [facility] and advised to come to the ED for head CT.  There are no other associated systemic symptoms, there are no other alleviating or modifying factors.   Past Medical History  Diagnosis Date  . Diabetes mellitus without complication   . Hypertension   . PTSD (post-traumatic stress disorder)   . Anxiety    Past Surgical History  Procedure Laterality Date  . Cholecystectomy    . Abdominal hysterectomy    . Back surgery     No family history on file. History  Substance Use Topics  . Smoking status: Never Smoker   . Smokeless tobacco: Not on file  . Alcohol Use: No   OB History    No data available     Review of Systems  ROS reviewed and all otherwise negative except for mentioned in HPI    Allergies  Penicillins  Home Medications   Prior to Admission medications   Medication Sig Start Date End Date Taking? Authorizing Provider  amoxicillin-clavulanate (AUGMENTIN) 875-125 MG per tablet Take 1 tablet by mouth 2 (two) times daily. One po bid x 7 days 06/07/14  Yes Bronson Ing, DPM  clonazePAM (KLONOPIN) 1 MG tablet Take 1 mg by mouth 2 (two) times daily.   Yes Historical Provider, MD  dapagliflozin  propanediol (FARXIGA) 10 MG TABS tablet Take 1 tablet by mouth daily.   Yes Historical Provider, MD  gabapentin (NEURONTIN) 300 MG capsule Take 300 mg by mouth 3 (three) times daily.   Yes Historical Provider, MD  HYDROcodone-acetaminophen (NORCO/VICODIN) 5-325 MG per tablet Take 1-2 tablets by mouth every 6 (six) hours as needed for moderate pain. 10/11/13  Yes Merryl Hacker, MD  ibuprofen (ADVIL,MOTRIN) 200 MG tablet Take 400 mg by mouth every 6 (six) hours as needed for mild pain or moderate pain.   Yes Historical Provider, MD  phenazopyridine (PYRIDIUM) 200 MG tablet Take 1 tablet (200 mg total) by mouth 3 (three) times daily as needed for pain. 03/09/14  Yes Charlesetta Shanks, MD  rosuvastatin (CRESTOR) 20 MG tablet Take 20 mg by mouth daily.   Yes Historical Provider, MD  valACYclovir (VALTREX) 500 MG tablet Take 500 mg by mouth 2 (two) times daily as needed (cold sores). Take 1 tablet by mouth twice daily as needed for breakouts. 02/06/14  Yes Historical Provider, MD  amoxicillin-clavulanate (AUGMENTIN) 875-125 MG per tablet Take 1 tablet by mouth 2 (two) times daily. One po bid x 7 days 06/07/14   Bronson Ing, DPM  amoxicillin-clavulanate (AUGMENTIN) 875-125 MG per tablet Take 1 tablet by mouth 2 (two) times daily. One po bid x 7 days 06/07/14   Curt Bears  P [place], DPM   BP 111/65 mmHg  Pulse 72  Temp(Src) 97.7 F (36.5 C) (Oral)  Resp 18  SpO2 96%  Vitals reviewed Physical Exam  Physical Examination: General appearance - alert, well appearing, and in no distress Mental status - alert, oriented to person, place, and time Head- NCAT Eyes - pupils equal and reactive, extraocular eye movements intact Mouth - mucous membranes moist, pharynx normal without lesions Neck - supple, no significant adenopathy, no midline tenderness to palpation Chest - clear to auscultation, no wheezes, rales or rhonchi, symmetric air entry, no chest wall tenderness, no crepitus Heart - normal rate, regular  rhythm, normal S1, S2, no murmurs, rubs, clicks or gallops Abdomen - soft, nontender, nondistended, no masses or organomegaly, nabs Neurological - alert, oriented x 3, cranial nerves 2-12 tested and intact. Strength 5/5 in extremities x 4, sensation intact Musculoskeletal - no joint tenderness, deformity or swelling Extremities - peripheral pulses normal, no pedal edema, no clubbing or cyanosis Skin - normal coloration and turgor, no rashes  ED Course  Procedures (including critical care time) Labs Review Labs Reviewed - No data to display  Imaging Review Ct Head Wo Contrast  06/16/2014   CLINICAL DATA:  [place] today striking RIGHT side of head on toilet, no loss of consciousness, history hypertension, diabetes  EXAM: CT HEAD WITHOUT CONTRAST  TECHNIQUE: Contiguous axial images were obtained from the base of the skull through the vertex without intravenous contrast.  COMPARISON:  06/05/2014  FINDINGS: Normal ventricular morphology.  No midline shift or mass effect.  Normal appearance of brain parenchyma.  No intracranial hemorrhage, mass lesion, or acute infarction.  Visualized paranasal sinuses and mastoid air cells clear.  Bones unremarkable.  Atherosclerotic calcification at carotid siphons.  IMPRESSION: No acute intracranial abnormalities.   Electronically Signed   By: Lavonia Dana M.D.   On: 06/16/2014 19:08     EKG Interpretation None      MDM   Final diagnoses:  Fall, initial encounter  Minor head injury, initial encounter    Pt presenting with c/o fall with head injury- CT scan reassuring.  No chest wall tenderness to suggest rib fracture or other significant intrathoracic traumatic injury.  No neck or back tenderness.  D/w patient that she may have some post-concussive type symptoms after hitting her head.  She states she has both zofran and hydrocodone at home.  Discharged with strict return precautions.  Pt agreeable with plan.    Alfonzo Beers, MD 06/16/14 2004

## 2014-06-16 NOTE — ED Notes (Signed)
Pt was in shower last night, fell in shower landing with chest on tub side and head hit commode. Pt c/o headache, dizziness and blurred vision that started today.

## 2014-06-16 NOTE — Discharge Instructions (Signed)
Return to the ED with any concerns including vomiting, seizure activity, changes in vision or speech, difficulty breathing, abdominal pain, weakness of arms or legs, decreased level of alertness/lethargy, or any other alarming symptoms

## 2014-06-20 DIAGNOSIS — F331 Major depressive disorder, recurrent, moderate: Secondary | ICD-10-CM | POA: Diagnosis not present

## 2014-06-20 DIAGNOSIS — F4310 Post-traumatic stress disorder, unspecified: Secondary | ICD-10-CM | POA: Diagnosis not present

## 2014-06-27 DIAGNOSIS — F331 Major depressive disorder, recurrent, moderate: Secondary | ICD-10-CM | POA: Diagnosis not present

## 2014-07-06 DIAGNOSIS — F331 Major depressive disorder, recurrent, moderate: Secondary | ICD-10-CM | POA: Diagnosis not present

## 2014-07-06 DIAGNOSIS — F4310 Post-traumatic stress disorder, unspecified: Secondary | ICD-10-CM | POA: Diagnosis not present

## 2014-07-27 DIAGNOSIS — Z1231 Encounter for screening mammogram for malignant neoplasm of breast: Secondary | ICD-10-CM | POA: Diagnosis not present

## 2014-08-08 DIAGNOSIS — H16223 Keratoconjunctivitis sicca, not specified as Sjogren's, bilateral: Secondary | ICD-10-CM | POA: Diagnosis not present

## 2014-08-10 DIAGNOSIS — R251 Tremor, unspecified: Secondary | ICD-10-CM | POA: Diagnosis not present

## 2014-08-10 DIAGNOSIS — G608 Other hereditary and idiopathic neuropathies: Secondary | ICD-10-CM | POA: Diagnosis not present

## 2014-08-10 DIAGNOSIS — F419 Anxiety disorder, unspecified: Secondary | ICD-10-CM | POA: Diagnosis not present

## 2014-08-10 DIAGNOSIS — E119 Type 2 diabetes mellitus without complications: Secondary | ICD-10-CM | POA: Diagnosis not present

## 2014-08-14 DIAGNOSIS — F331 Major depressive disorder, recurrent, moderate: Secondary | ICD-10-CM | POA: Diagnosis not present

## 2014-09-13 DIAGNOSIS — Z1272 Encounter for screening for malignant neoplasm of vagina: Secondary | ICD-10-CM | POA: Diagnosis not present

## 2014-09-13 DIAGNOSIS — Z13820 Encounter for screening for osteoporosis: Secondary | ICD-10-CM | POA: Diagnosis not present

## 2014-09-13 DIAGNOSIS — R87810 Cervical high risk human papillomavirus (HPV) DNA test positive: Secondary | ICD-10-CM | POA: Diagnosis not present

## 2014-09-13 DIAGNOSIS — B009 Herpesviral infection, unspecified: Secondary | ICD-10-CM | POA: Diagnosis not present

## 2014-09-13 DIAGNOSIS — Z90710 Acquired absence of both cervix and uterus: Secondary | ICD-10-CM | POA: Diagnosis not present

## 2014-09-13 DIAGNOSIS — Z01419 Encounter for gynecological examination (general) (routine) without abnormal findings: Secondary | ICD-10-CM | POA: Diagnosis not present

## 2014-09-13 DIAGNOSIS — R238 Other skin changes: Secondary | ICD-10-CM | POA: Diagnosis not present

## 2014-09-13 DIAGNOSIS — R87610 Atypical squamous cells of undetermined significance on cytologic smear of cervix (ASC-US): Secondary | ICD-10-CM | POA: Diagnosis not present

## 2014-09-13 DIAGNOSIS — M791 Myalgia: Secondary | ICD-10-CM | POA: Diagnosis not present

## 2014-09-14 DIAGNOSIS — F331 Major depressive disorder, recurrent, moderate: Secondary | ICD-10-CM | POA: Diagnosis not present

## 2014-09-20 DIAGNOSIS — M542 Cervicalgia: Secondary | ICD-10-CM | POA: Diagnosis not present

## 2014-09-20 DIAGNOSIS — M25511 Pain in right shoulder: Secondary | ICD-10-CM | POA: Diagnosis not present

## 2014-09-27 DIAGNOSIS — M542 Cervicalgia: Secondary | ICD-10-CM | POA: Diagnosis not present

## 2014-09-27 DIAGNOSIS — M25511 Pain in right shoulder: Secondary | ICD-10-CM | POA: Diagnosis not present

## 2014-10-02 DIAGNOSIS — M25511 Pain in right shoulder: Secondary | ICD-10-CM | POA: Diagnosis not present

## 2014-10-02 DIAGNOSIS — M542 Cervicalgia: Secondary | ICD-10-CM | POA: Diagnosis not present

## 2014-10-04 DIAGNOSIS — M25511 Pain in right shoulder: Secondary | ICD-10-CM | POA: Diagnosis not present

## 2014-10-04 DIAGNOSIS — M542 Cervicalgia: Secondary | ICD-10-CM | POA: Diagnosis not present

## 2014-10-10 DIAGNOSIS — N888 Other specified noninflammatory disorders of cervix uteri: Secondary | ICD-10-CM | POA: Diagnosis not present

## 2014-10-10 DIAGNOSIS — R87810 Cervical high risk human papillomavirus (HPV) DNA test positive: Secondary | ICD-10-CM | POA: Diagnosis not present

## 2014-10-10 DIAGNOSIS — R87610 Atypical squamous cells of undetermined significance on cytologic smear of cervix (ASC-US): Secondary | ICD-10-CM | POA: Diagnosis not present

## 2014-10-12 DIAGNOSIS — F331 Major depressive disorder, recurrent, moderate: Secondary | ICD-10-CM | POA: Diagnosis not present

## 2014-11-04 DIAGNOSIS — J060 Acute laryngopharyngitis: Secondary | ICD-10-CM | POA: Diagnosis not present

## 2014-11-30 DIAGNOSIS — F331 Major depressive disorder, recurrent, moderate: Secondary | ICD-10-CM | POA: Diagnosis not present

## 2014-12-05 DIAGNOSIS — M76821 Posterior tibial tendinitis, right leg: Secondary | ICD-10-CM | POA: Diagnosis not present

## 2014-12-05 DIAGNOSIS — M71571 Other bursitis, not elsewhere classified, right ankle and foot: Secondary | ICD-10-CM | POA: Diagnosis not present

## 2014-12-05 DIAGNOSIS — M7731 Calcaneal spur, right foot: Secondary | ICD-10-CM | POA: Diagnosis not present

## 2014-12-05 DIAGNOSIS — M722 Plantar fascial fibromatosis: Secondary | ICD-10-CM | POA: Diagnosis not present

## 2014-12-12 DIAGNOSIS — S92041A Displaced other fracture of tuberosity of right calcaneus, initial encounter for closed fracture: Secondary | ICD-10-CM | POA: Diagnosis not present

## 2014-12-12 DIAGNOSIS — M7732 Calcaneal spur, left foot: Secondary | ICD-10-CM | POA: Diagnosis not present

## 2014-12-14 DIAGNOSIS — I10 Essential (primary) hypertension: Secondary | ICD-10-CM | POA: Diagnosis not present

## 2014-12-14 DIAGNOSIS — E119 Type 2 diabetes mellitus without complications: Secondary | ICD-10-CM | POA: Diagnosis not present

## 2014-12-14 DIAGNOSIS — E785 Hyperlipidemia, unspecified: Secondary | ICD-10-CM | POA: Diagnosis not present

## 2014-12-14 DIAGNOSIS — F419 Anxiety disorder, unspecified: Secondary | ICD-10-CM | POA: Diagnosis not present

## 2014-12-19 DIAGNOSIS — S92044D Nondisplaced other fracture of tuberosity of right calcaneus, subsequent encounter for fracture with routine healing: Secondary | ICD-10-CM | POA: Diagnosis not present

## 2015-01-01 DIAGNOSIS — J00 Acute nasopharyngitis [common cold]: Secondary | ICD-10-CM | POA: Diagnosis not present

## 2015-01-06 DIAGNOSIS — Z23 Encounter for immunization: Secondary | ICD-10-CM | POA: Diagnosis not present

## 2015-03-15 DIAGNOSIS — E119 Type 2 diabetes mellitus without complications: Secondary | ICD-10-CM | POA: Diagnosis not present

## 2015-03-15 DIAGNOSIS — Z23 Encounter for immunization: Secondary | ICD-10-CM | POA: Diagnosis not present

## 2015-03-15 DIAGNOSIS — E785 Hyperlipidemia, unspecified: Secondary | ICD-10-CM | POA: Diagnosis not present

## 2015-03-15 DIAGNOSIS — J309 Allergic rhinitis, unspecified: Secondary | ICD-10-CM | POA: Diagnosis not present

## 2015-03-15 DIAGNOSIS — F419 Anxiety disorder, unspecified: Secondary | ICD-10-CM | POA: Diagnosis not present

## 2015-04-03 DIAGNOSIS — J069 Acute upper respiratory infection, unspecified: Secondary | ICD-10-CM | POA: Diagnosis not present

## 2015-04-03 DIAGNOSIS — F331 Major depressive disorder, recurrent, moderate: Secondary | ICD-10-CM | POA: Diagnosis not present

## 2015-05-23 DIAGNOSIS — R87610 Atypical squamous cells of undetermined significance on cytologic smear of cervix (ASC-US): Secondary | ICD-10-CM | POA: Diagnosis not present

## 2015-05-23 DIAGNOSIS — Z01411 Encounter for gynecological examination (general) (routine) with abnormal findings: Secondary | ICD-10-CM | POA: Diagnosis not present

## 2015-05-23 DIAGNOSIS — R87620 Atypical squamous cells of undetermined significance on cytologic smear of vagina (ASC-US): Secondary | ICD-10-CM | POA: Diagnosis not present

## 2015-05-23 DIAGNOSIS — R87810 Cervical high risk human papillomavirus (HPV) DNA test positive: Secondary | ICD-10-CM | POA: Diagnosis not present

## 2015-05-23 DIAGNOSIS — R339 Retention of urine, unspecified: Secondary | ICD-10-CM | POA: Diagnosis not present

## 2015-05-23 DIAGNOSIS — Z01419 Encounter for gynecological examination (general) (routine) without abnormal findings: Secondary | ICD-10-CM | POA: Diagnosis not present

## 2015-06-06 DIAGNOSIS — F331 Major depressive disorder, recurrent, moderate: Secondary | ICD-10-CM | POA: Diagnosis not present

## 2015-06-13 DIAGNOSIS — F418 Other specified anxiety disorders: Secondary | ICD-10-CM | POA: Diagnosis not present

## 2015-06-13 DIAGNOSIS — K219 Gastro-esophageal reflux disease without esophagitis: Secondary | ICD-10-CM | POA: Diagnosis not present

## 2015-06-13 DIAGNOSIS — E119 Type 2 diabetes mellitus without complications: Secondary | ICD-10-CM | POA: Diagnosis not present

## 2015-06-13 DIAGNOSIS — F4310 Post-traumatic stress disorder, unspecified: Secondary | ICD-10-CM | POA: Diagnosis not present

## 2015-06-13 DIAGNOSIS — G4700 Insomnia, unspecified: Secondary | ICD-10-CM | POA: Diagnosis not present

## 2015-06-13 DIAGNOSIS — F419 Anxiety disorder, unspecified: Secondary | ICD-10-CM | POA: Diagnosis not present

## 2015-06-28 DIAGNOSIS — N390 Urinary tract infection, site not specified: Secondary | ICD-10-CM | POA: Diagnosis not present

## 2015-07-10 DIAGNOSIS — N390 Urinary tract infection, site not specified: Secondary | ICD-10-CM | POA: Diagnosis not present

## 2015-07-17 DIAGNOSIS — J301 Allergic rhinitis due to pollen: Secondary | ICD-10-CM | POA: Diagnosis not present

## 2015-07-17 DIAGNOSIS — J069 Acute upper respiratory infection, unspecified: Secondary | ICD-10-CM | POA: Diagnosis not present

## 2015-08-21 ENCOUNTER — Encounter (HOSPITAL_COMMUNITY): Payer: Self-pay

## 2015-08-21 ENCOUNTER — Emergency Department (HOSPITAL_COMMUNITY): Payer: Medicare Other

## 2015-08-21 ENCOUNTER — Emergency Department (HOSPITAL_COMMUNITY)
Admission: EM | Admit: 2015-08-21 | Discharge: 2015-08-21 | Disposition: A | Discharge status: Home / Self Care | Payer: Medicare Other | Attending: Emergency Medicine | Admitting: Emergency Medicine

## 2015-08-21 DIAGNOSIS — Y9389 Activity, other specified: Secondary | ICD-10-CM | POA: Diagnosis not present

## 2015-08-21 DIAGNOSIS — Z7984 Long term (current) use of oral hypoglycemic drugs: Secondary | ICD-10-CM | POA: Insufficient documentation

## 2015-08-21 DIAGNOSIS — F419 Anxiety disorder, unspecified: Secondary | ICD-10-CM | POA: Insufficient documentation

## 2015-08-21 DIAGNOSIS — E119 Type 2 diabetes mellitus without complications: Secondary | ICD-10-CM | POA: Insufficient documentation

## 2015-08-21 DIAGNOSIS — I10 Essential (primary) hypertension: Secondary | ICD-10-CM | POA: Diagnosis not present

## 2015-08-21 DIAGNOSIS — M25512 Pain in left shoulder: Secondary | ICD-10-CM

## 2015-08-21 DIAGNOSIS — Y998 Other external cause status: Secondary | ICD-10-CM | POA: Insufficient documentation

## 2015-08-21 DIAGNOSIS — X509XXA Other and unspecified overexertion or strenuous movements or postures, initial encounter: Secondary | ICD-10-CM | POA: Insufficient documentation

## 2015-08-21 DIAGNOSIS — S199XXA Unspecified injury of neck, initial encounter: Secondary | ICD-10-CM | POA: Diagnosis not present

## 2015-08-21 DIAGNOSIS — S4992XA Unspecified injury of left shoulder and upper arm, initial encounter: Secondary | ICD-10-CM | POA: Insufficient documentation

## 2015-08-21 DIAGNOSIS — Z79899 Other long term (current) drug therapy: Secondary | ICD-10-CM | POA: Insufficient documentation

## 2015-08-21 DIAGNOSIS — Z880 Allergy status to penicillin: Secondary | ICD-10-CM | POA: Insufficient documentation

## 2015-08-21 DIAGNOSIS — Y9289 Other specified places as the place of occurrence of the external cause: Secondary | ICD-10-CM | POA: Insufficient documentation

## 2015-08-21 LAB — CBG MONITORING, ED: Glucose-Capillary: 158 mg/dL — ABNORMAL HIGH (ref 65–99)

## 2015-08-21 MED ORDER — CYCLOBENZAPRINE HCL 10 MG PO TABS: 10.0000 mg | ORAL_TABLET | Freq: Three times a day (TID) | ORAL | Status: DC | PRN

## 2015-08-21 MED ORDER — CYCLOBENZAPRINE HCL 10 MG PO TABS
10.0000 mg | ORAL_TABLET | Freq: Once | ORAL | Status: AC
  Administered 2015-08-21: 10 mg via ORAL
  Filled 2015-08-21: qty 1

## 2015-08-21 MED ORDER — IBUPROFEN 400 MG PO TABS
600.0000 mg | ORAL_TABLET | Freq: Once | ORAL | Status: AC
  Administered 2015-08-21: 600 mg via ORAL
  Filled 2015-08-21: qty 1

## 2015-08-21 NOTE — Discharge Instructions (Signed)
Take Flexeril and Motrin scheduled for the next several days for pain control.  Wear the shoulder sling as needed for comfort.  Make an appointment with her primary care doctor as well as the orthopedic doctor as needed for persistence of your left shoulder pain.

## 2015-08-21 NOTE — ED Provider Notes (Signed)
CSN: [id number]     Arrival date & time 08/21/15  1333 History   First MD Initiated Contact with Patient 08/21/15 1646     Chief Complaint  Patient presents with  . Arm Pain     (Consider location/radiation/quality/duration/timing/severity/associated sxs/prior Treatment) Patient is a 65 y.o. female presenting with general illness. The history is provided by the patient.  Illness Location:  Left upper arm pain Severity:  Moderate Onset quality:  Gradual Duration:  4 days Timing:  Constant Progression:  Unchanged Chronicity:  New Context:  4 days ago the patient felt a popping sensation in her left arm. Persistence of pain and limited range of motion since that time. No chest pain or shortness of breath. No fevers, chills, infectious symptoms. Associated symptoms: no abdominal pain, no chest pain, no cough, no diarrhea, no fever, no headaches, no nausea, no shortness of breath and no vomiting     Past Medical History  Diagnosis Date  . Diabetes mellitus without complication ([place])   . Hypertension   . PTSD (post-traumatic stress disorder)   . Anxiety    Past Surgical History  Procedure Laterality Date  . Cholecystectomy    . Abdominal hysterectomy    . Back surgery     History reviewed. No pertinent family history. Social History  Substance Use Topics  . Smoking status: Never Smoker   . Smokeless tobacco: None  . Alcohol Use: No   OB History    No data available     Review of Systems  Constitutional: Negative for fever and chills.  Respiratory: Negative for cough and shortness of breath.   Cardiovascular: Negative for chest pain.  Gastrointestinal: Negative for nausea, vomiting, abdominal pain and diarrhea.  Musculoskeletal: Positive for neck pain. Negative for back pain.  Neurological: Negative for weakness, light-headedness, numbness and headaches.  All other systems reviewed and are negative.     Allergies  Penicillins  Home Medications   Prior to  Admission medications   Medication Sig Start Date End Date Taking? Authorizing Provider  clonazePAM (KLONOPIN) 1 MG tablet Take 1 mg by mouth 2 (two) times daily as needed for anxiety.    Yes Historical Provider, MD  dapagliflozin propanediol (FARXIGA) 10 MG TABS tablet Take 1 tablet by mouth daily.   Yes Historical Provider, MD  FLUoxetine (PROZAC) 20 MG capsule Take 1 capsule by mouth daily. 07/31/15  Yes Historical Provider, MD  fluticasone (FLONASE) 50 MCG/ACT nasal spray Place 1 spray into both nostrils 2 (two) times daily as needed. 06/08/14  Yes Historical Provider, MD  gabapentin (NEURONTIN) 800 MG tablet Take 800-1,600 mg by mouth 2 (two) times daily. 800 mg by mouth in the morning and 1,600 mg at bedtime   Yes Historical Provider, MD  hydrochlorothiazide (HYDRODIURIL) 25 MG tablet Take 1 tablet by mouth daily. 08/09/14  Yes Historical Provider, MD  ibuprofen (ADVIL,MOTRIN) 200 MG tablet Take 400 mg by mouth every 6 (six) hours as needed for mild pain or moderate pain.   Yes Historical Provider, MD  pantoprazole (PROTONIX) 40 MG tablet Take 1 tablet by mouth every morning. 06/08/14  Yes Historical Provider, MD  rosuvastatin (CRESTOR) 10 MG tablet Take 1 tablet by mouth at bedtime. 08/07/15  Yes Historical Provider, MD  amoxicillin-clavulanate (AUGMENTIN) 875-125 MG per tablet Take 1 tablet by mouth 2 (two) times daily. One po bid x 7 days 06/07/14   Bronson Ing, DPM  amoxicillin-clavulanate (AUGMENTIN) 875-125 MG per tablet Take 1 tablet by mouth 2 (two) times  daily. One po bid x 7 days 06/07/14   Bronson Ing, DPM  amoxicillin-clavulanate (AUGMENTIN) 875-125 MG per tablet Take 1 tablet by mouth 2 (two) times daily. One po bid x 7 days 06/07/14   Bronson Ing, DPM  cyclobenzaprine (FLEXERIL) 10 MG tablet Take 1 tablet (10 mg total) by mouth 3 (three) times daily as needed for muscle spasms. 08/21/15   Maryan Puls, MD  HYDROcodone-acetaminophen (NORCO/VICODIN) 5-325 MG per tablet Take 1-2  tablets by mouth every 6 (six) hours as needed for moderate pain. 10/11/13   Merryl Hacker, MD  phenazopyridine (PYRIDIUM) 200 MG tablet Take 1 tablet (200 mg total) by mouth 3 (three) times daily as needed for pain. 03/09/14   Charlesetta Shanks, MD   BP 107/91 mmHg  Pulse 51  Temp(Src) 97.4 F (36.3 C) (Oral)  Resp 18  SpO2 99% Physical Exam  Constitutional: She is oriented to person, place, and time. She appears well-developed and well-nourished. No distress.  HENT:  Head: Normocephalic and atraumatic.  Eyes: EOM are normal. Pupils are equal, round, and reactive to light.  Neck: Normal range of motion.  Cardiovascular: Normal rate and regular rhythm.   Pulmonary/Chest: No tachypnea. No respiratory distress.  Abdominal: Soft. Normal appearance. There is no tenderness.  Musculoskeletal:       Left shoulder: She exhibits decreased range of motion and tenderness.       Left elbow: Normal. She exhibits normal range of motion. No tenderness found.       Cervical back: She exhibits no tenderness.       Thoracic back: She exhibits no tenderness.       Lumbar back: She exhibits no tenderness.       Back:  Tenderness to palpation of the left paraspinal cervical muscles. Left upper extremity: Tenderness to palpation throughout her left upper arm. No gross deformity. Neurovascularly intact distal.  Neurological: She is alert and oriented to person, place, and time. She has normal strength. No sensory deficit. GCS eye subscore is 4. GCS verbal subscore is 5. GCS motor subscore is 6.  Skin: Skin is warm and dry.    ED Course  Procedures (including critical care time) Labs Review Labs Reviewed  CBG MONITORING, ED - Abnormal; Notable for the following:    Glucose-Capillary 158 (*)    All other components within normal limits  CBG MONITORING, ED    Imaging Review Dg Cervical Spine 2-3 Views  08/21/2015  CLINICAL DATA:  Left shoulder pain EXAM: CERVICAL SPINE - 2-3 VIEW COMPARISON:  None.  FINDINGS: There is no evidence of cervical spine fracture or prevertebral soft tissue swelling. Alignment is normal. There are degenerative joint changes of the mid to lower cervical spine with facet joint sclerosis and anterior osteophytosis IMPRESSION: No acute fracture or dislocation. Degenerative joint changes of cervical spine. Electronically Signed   By: Abelardo Diesel M.D.   On: 08/21/2015 18:14   Dg Shoulder Left  08/21/2015  CLINICAL DATA:  65 year old female with left shoulder pain. EXAM: LEFT SHOULDER - 2+ VIEW; LEFT HUMERUS - 2+ VIEW COMPARISON:  None. FINDINGS: There is no acute fracture or dislocation. The bones are osteopenic. No significant degenerative changes of the left shoulder. The soft tissues appear unremarkable. IMPRESSION: No acute fracture or dislocation.  Osteopenia. Electronically Signed   By: Anner Crete M.D.   On: 08/21/2015 18:14   Dg Humerus Left  08/21/2015  CLINICAL DATA:  65 year old female with left shoulder pain. EXAM: LEFT SHOULDER -  2+ VIEW; LEFT HUMERUS - 2+ VIEW COMPARISON:  None. FINDINGS: There is no acute fracture or dislocation. The bones are osteopenic. No significant degenerative changes of the left shoulder. The soft tissues appear unremarkable. IMPRESSION: No acute fracture or dislocation.  Osteopenia. Electronically Signed   By: Anner Crete M.D.   On: 08/21/2015 18:14   I have personally reviewed and evaluated these images and lab results as part of my medical decision-making.   EKG Interpretation None      MDM   Final diagnoses:  Left shoulder pain    65 year old female presenting with left shoulder pain. Patient has some limitation of range of motion of her shoulder secondary to pain. Tenderness to palpation of the upper arm and the left paraspinal muscles. No tenderness palpation of her midline spine. Denies any recent traumatic events. She is otherwise neurovascularly intact and has full range of motion of joints distal to the  shoulder. Got cervical spine, left shoulder, humerus fractures without any evidence of acute injury. Concern for potential rotator cuff injury versus cervical spine radicular pain. Gave the patient a dose of Flexeril and Motrin here with improvement in symptoms. We'll place the patient in a sling for comfort. Encouraged her to continue to exercise her left upper extremity to avoid frozen shoulder. Encouraged her to continue to use Motrin, and given a prescription for Flexeril for pain control. Provided her information to follow up with orthopedics on the outpatient basis. Encouraged her to follow up with her primary care doctor as soon as possible.  Strict return precautions provided. Patient discharged in stable condition.  Maryan Puls, MD 08/21/15 [place], MD 08/23/15 [phone]

## 2015-08-21 NOTE — ED Notes (Signed)
Onset 3 days ago pt lying down took arms back over head and pt heard "pop" in possible left shoulder area, since then numbness/tingling in left hand, pain in left side of neck, shoulder and upper arm.  Painful to lift arm.

## 2015-08-23 ENCOUNTER — Other Ambulatory Visit: Payer: Self-pay | Admitting: Sports Medicine

## 2015-08-23 DIAGNOSIS — M542 Cervicalgia: Secondary | ICD-10-CM

## 2015-08-23 DIAGNOSIS — M4722 Other spondylosis with radiculopathy, cervical region: Secondary | ICD-10-CM | POA: Diagnosis not present

## 2015-08-23 DIAGNOSIS — M25512 Pain in left shoulder: Secondary | ICD-10-CM | POA: Diagnosis not present

## 2015-08-29 ENCOUNTER — Ambulatory Visit
Admission: RE | Admit: 2015-08-29 | Discharge: 2015-08-29 | Disposition: A | Discharge status: Home / Self Care | Payer: Medicare Other | Source: Ambulatory Visit | Attending: Sports Medicine | Admitting: Sports Medicine

## 2015-08-29 DIAGNOSIS — M50322 Other cervical disc degeneration at C5-C6 level: Secondary | ICD-10-CM | POA: Diagnosis not present

## 2015-08-29 DIAGNOSIS — M542 Cervicalgia: Secondary | ICD-10-CM

## 2015-08-29 DIAGNOSIS — M50321 Other cervical disc degeneration at C4-C5 level: Secondary | ICD-10-CM | POA: Diagnosis not present

## 2015-08-29 DIAGNOSIS — M50323 Other cervical disc degeneration at C6-C7 level: Secondary | ICD-10-CM | POA: Diagnosis not present

## 2015-09-10 DIAGNOSIS — M4722 Other spondylosis with radiculopathy, cervical region: Secondary | ICD-10-CM | POA: Diagnosis not present

## 2015-09-10 DIAGNOSIS — M25512 Pain in left shoulder: Secondary | ICD-10-CM | POA: Diagnosis not present

## 2015-09-11 DIAGNOSIS — F419 Anxiety disorder, unspecified: Secondary | ICD-10-CM | POA: Diagnosis not present

## 2015-09-11 DIAGNOSIS — Z1159 Encounter for screening for other viral diseases: Secondary | ICD-10-CM | POA: Diagnosis not present

## 2015-09-11 DIAGNOSIS — E785 Hyperlipidemia, unspecified: Secondary | ICD-10-CM | POA: Diagnosis not present

## 2015-09-11 DIAGNOSIS — Z1211 Encounter for screening for malignant neoplasm of colon: Secondary | ICD-10-CM | POA: Diagnosis not present

## 2015-09-11 DIAGNOSIS — I10 Essential (primary) hypertension: Secondary | ICD-10-CM | POA: Diagnosis not present

## 2015-09-11 DIAGNOSIS — E119 Type 2 diabetes mellitus without complications: Secondary | ICD-10-CM | POA: Diagnosis not present

## 2015-09-19 ENCOUNTER — Telehealth: Payer: Self-pay | Admitting: Gastroenterology

## 2015-09-19 NOTE — Telephone Encounter (Signed)
If she is not having any problems (which necessitate a visit), she is on for a recall Dec. 2019.

## 2015-09-19 NOTE — Telephone Encounter (Signed)
Based on colonoscopy report in 2009, she will be due for recall screening colonoscopy in December 2019. Thanks

## 2015-10-01 DIAGNOSIS — Z1231 Encounter for screening mammogram for malignant neoplasm of breast: Secondary | ICD-10-CM | POA: Diagnosis not present

## 2015-10-20 ENCOUNTER — Emergency Department (HOSPITAL_COMMUNITY): Payer: Medicare Other

## 2015-10-20 ENCOUNTER — Emergency Department (HOSPITAL_COMMUNITY)
Admission: EM | Admit: 2015-10-20 | Discharge: 2015-10-20 | Disposition: A | Discharge status: Home / Self Care | Payer: Medicare Other | Attending: Emergency Medicine | Admitting: Emergency Medicine

## 2015-10-20 ENCOUNTER — Encounter (HOSPITAL_COMMUNITY): Payer: Self-pay

## 2015-10-20 DIAGNOSIS — S8992XA Unspecified injury of left lower leg, initial encounter: Secondary | ICD-10-CM | POA: Diagnosis not present

## 2015-10-20 DIAGNOSIS — Z7951 Long term (current) use of inhaled steroids: Secondary | ICD-10-CM | POA: Insufficient documentation

## 2015-10-20 DIAGNOSIS — E876 Hypokalemia: Secondary | ICD-10-CM | POA: Diagnosis not present

## 2015-10-20 DIAGNOSIS — Z79899 Other long term (current) drug therapy: Secondary | ICD-10-CM | POA: Insufficient documentation

## 2015-10-20 DIAGNOSIS — S59901A Unspecified injury of right elbow, initial encounter: Secondary | ICD-10-CM | POA: Diagnosis not present

## 2015-10-20 DIAGNOSIS — W010XXA Fall on same level from slipping, tripping and stumbling without subsequent striking against object, initial encounter: Secondary | ICD-10-CM | POA: Insufficient documentation

## 2015-10-20 DIAGNOSIS — Z792 Long term (current) use of antibiotics: Secondary | ICD-10-CM | POA: Diagnosis not present

## 2015-10-20 DIAGNOSIS — Z5181 Encounter for therapeutic drug level monitoring: Secondary | ICD-10-CM | POA: Insufficient documentation

## 2015-10-20 DIAGNOSIS — M25562 Pain in left knee: Secondary | ICD-10-CM | POA: Diagnosis not present

## 2015-10-20 DIAGNOSIS — M25552 Pain in left hip: Secondary | ICD-10-CM | POA: Diagnosis not present

## 2015-10-20 DIAGNOSIS — M25521 Pain in right elbow: Secondary | ICD-10-CM | POA: Diagnosis not present

## 2015-10-20 DIAGNOSIS — M25532 Pain in left wrist: Secondary | ICD-10-CM | POA: Diagnosis not present

## 2015-10-20 DIAGNOSIS — T148 Other injury of unspecified body region: Secondary | ICD-10-CM | POA: Diagnosis not present

## 2015-10-20 DIAGNOSIS — S79912A Unspecified injury of left hip, initial encounter: Secondary | ICD-10-CM | POA: Diagnosis not present

## 2015-10-20 DIAGNOSIS — Y9301 Activity, walking, marching and hiking: Secondary | ICD-10-CM | POA: Insufficient documentation

## 2015-10-20 DIAGNOSIS — Y999 Unspecified external cause status: Secondary | ICD-10-CM | POA: Diagnosis not present

## 2015-10-20 DIAGNOSIS — I10 Essential (primary) hypertension: Secondary | ICD-10-CM | POA: Diagnosis not present

## 2015-10-20 DIAGNOSIS — S6992XA Unspecified injury of left wrist, hand and finger(s), initial encounter: Secondary | ICD-10-CM | POA: Diagnosis not present

## 2015-10-20 DIAGNOSIS — Y929 Unspecified place or not applicable: Secondary | ICD-10-CM | POA: Diagnosis not present

## 2015-10-20 DIAGNOSIS — E119 Type 2 diabetes mellitus without complications: Secondary | ICD-10-CM | POA: Diagnosis not present

## 2015-10-20 DIAGNOSIS — W19XXXA Unspecified fall, initial encounter: Secondary | ICD-10-CM

## 2015-10-20 LAB — CBC WITH DIFFERENTIAL/PLATELET
Basophils Absolute: 0 10*3/uL (ref 0.0–0.1)
Basophils Relative: 1 %
Eosinophils Absolute: 0.1 10*3/uL (ref 0.0–0.7)
Eosinophils Relative: 1 %
HCT: 41.3 % (ref 36.0–46.0)
Hemoglobin: 14.5 g/dL (ref 12.0–15.0)
Lymphocytes Relative: 32 %
Lymphs Abs: 1.8 10*3/uL (ref 0.7–4.0)
MCH: 32.2 pg (ref 26.0–34.0)
MCHC: 35.1 g/dL (ref 30.0–36.0)
MCV: 91.6 fL (ref 78.0–100.0)
Monocytes Absolute: 0.7 10*3/uL (ref 0.1–1.0)
Monocytes Relative: 12 %
Neutro Abs: 3 10*3/uL (ref 1.7–7.7)
Neutrophils Relative %: 54 %
Platelets: 234 10*3/uL (ref 150–400)
RBC: 4.51 MIL/uL (ref 3.87–5.11)
RDW: 11.9 % (ref 11.5–15.5)
WBC: 5.6 10*3/uL (ref 4.0–10.5)

## 2015-10-20 LAB — BASIC METABOLIC PANEL
Anion gap: 9 (ref 5–15)
BUN: 20 mg/dL (ref 6–20)
CO2: 26 mmol/L (ref 22–32)
Calcium: 9.3 mg/dL (ref 8.9–10.3)
Chloride: 104 mmol/L (ref 101–111)
Creatinine, Ser: 1 mg/dL (ref 0.44–1.00)
GFR calc Af Amer: >60 mL/min (ref >60)
GFR calc non Af Amer: 59 mL/min — ABNORMAL LOW (ref >60)
Glucose, Bld: 81 mg/dL (ref 65–99)
Potassium: 2.8 mmol/L — ABNORMAL LOW (ref 3.5–5.1)
Sodium: 139 mmol/L (ref 135–145)

## 2015-10-20 LAB — PROTIME-INR
INR: 1.03 (ref 0.00–1.49)
Prothrombin Time: 13.3 seconds (ref 11.6–15.2)

## 2015-10-20 MED ORDER — LORAZEPAM 2 MG/ML IJ SOLN
1.0000 mg | Freq: Once | INTRAMUSCULAR | Status: AC
  Administered 2015-10-20: 1 mg via INTRAVENOUS
  Filled 2015-10-20: qty 1

## 2015-10-20 MED ORDER — OXYCODONE-ACETAMINOPHEN 5-325 MG PO TABS: 1.0000 | ORAL_TABLET | Freq: Four times a day (QID) | ORAL | Status: DC | PRN

## 2015-10-20 MED ORDER — POTASSIUM CHLORIDE CRYS ER 20 MEQ PO TBCR
40.0000 meq | EXTENDED_RELEASE_TABLET | Freq: Once | ORAL | Status: AC
  Administered 2015-10-20: 40 meq via ORAL
  Filled 2015-10-20: qty 2

## 2015-10-20 MED ORDER — BACITRACIN ZINC 500 UNIT/GM EX OINT
TOPICAL_OINTMENT | Freq: Two times a day (BID) | CUTANEOUS | Status: DC
  Administered 2015-10-20: 20:00:00 via TOPICAL
  Filled 2015-10-20: qty 0.9

## 2015-10-20 MED ORDER — LORAZEPAM 0.5 MG PO TABS: 0.5000 mg | ORAL_TABLET | Freq: Once | ORAL | Status: DC

## 2015-10-20 MED ORDER — MORPHINE SULFATE (PF) 2 MG/ML IV SOLN
1.0000 mg | Freq: Once | INTRAVENOUS | Status: AC
  Administered 2015-10-20: 1 mg via INTRAVENOUS
  Filled 2015-10-20: qty 1

## 2015-10-20 MED ORDER — NAPROXEN 500 MG PO TABS: 500.0000 mg | ORAL_TABLET | Freq: Two times a day (BID) | ORAL | Status: DC

## 2015-10-20 MED ORDER — KETOROLAC TROMETHAMINE 60 MG/2ML IM SOLN: 30.0000 mg | Freq: Once | INTRAMUSCULAR | Status: DC

## 2015-10-20 NOTE — ED Provider Notes (Signed)
CSN: [id number]     Arrival date & time 10/20/15  1429 History   First MD Initiated Contact with Patient 10/20/15 1452     Chief Complaint  Patient presents with  . Knee Pain     (Consider location/radiation/quality/duration/timing/severity/associated sxs/prior Treatment) HPI   [name] is a 65 y.o. female, with a history of anxiety, HTN, and DM, presenting to the ED with A slip and fall that occurred just prior to arrival. Patient states she had just gotten out of the pool and had wet feet, was walking down some stairs, slipped, and landed on her left knee and right elbow. Patient is complaining of pain to the right elbow, left knee, and left hip. Rates all pain as severe. Patient endorses severe anxiety, history of the same. Patient denies LOC, head trauma, dizziness, nausea/vomiting, neuro deficits, chest pain, shortness of breath, or any other complaints. Pt is not anticoagulated.       Past Medical History  Diagnosis Date  . Diabetes mellitus without complication ([place])   . Hypertension   . PTSD (post-traumatic stress disorder)   . Anxiety    Past Surgical History  Procedure Laterality Date  . Cholecystectomy    . Abdominal hysterectomy    . Back surgery     History reviewed. No pertinent family history. Social History  Substance Use Topics  . Smoking status: Never Smoker   . Smokeless tobacco: None  . Alcohol Use: No   OB History    No data available     Review of Systems  Respiratory: Negative for shortness of breath.   Cardiovascular: Negative for chest pain.  Musculoskeletal: Positive for joint swelling and arthralgias. Negative for back pain and neck pain.  Neurological: Negative for dizziness, weakness, light-headedness, numbness and headaches.  Psychiatric/Behavioral: The patient is nervous/anxious.   All other systems reviewed and are negative.     Allergies  Penicillins  Home Medications   Prior to Admission medications   Medication Sig  Start Date End Date Taking? Authorizing Provider  amoxicillin-clavulanate (AUGMENTIN) 875-125 MG per tablet Take 1 tablet by mouth 2 (two) times daily. One po bid x 7 days 06/07/14   Bronson Ing, DPM  amoxicillin-clavulanate (AUGMENTIN) 875-125 MG per tablet Take 1 tablet by mouth 2 (two) times daily. One po bid x 7 days 06/07/14   Bronson Ing, DPM  amoxicillin-clavulanate (AUGMENTIN) 875-125 MG per tablet Take 1 tablet by mouth 2 (two) times daily. One po bid x 7 days 06/07/14   Bronson Ing, DPM  clonazePAM (KLONOPIN) 1 MG tablet Take 1 mg by mouth 2 (two) times daily as needed for anxiety.     Historical Provider, MD  cyclobenzaprine (FLEXERIL) 10 MG tablet Take 1 tablet (10 mg total) by mouth 3 (three) times daily as needed for muscle spasms. 08/21/15   Maryan Puls, MD  dapagliflozin propanediol (FARXIGA) 10 MG TABS tablet Take 1 tablet by mouth daily.    Historical Provider, MD  FLUoxetine (PROZAC) 20 MG capsule Take 1 capsule by mouth daily. 07/31/15   Historical Provider, MD  fluticasone (FLONASE) 50 MCG/ACT nasal spray Place 1 spray into both nostrils 2 (two) times daily as needed. 06/08/14   Historical Provider, MD  gabapentin (NEURONTIN) 800 MG tablet Take 800-1,600 mg by mouth 2 (two) times daily. 800 mg by mouth in the morning and 1,600 mg at bedtime    Historical Provider, MD  hydrochlorothiazide (HYDRODIURIL) 25 MG tablet Take 1 tablet by mouth daily. 08/09/14  Historical Provider, MD  HYDROcodone-acetaminophen (NORCO/VICODIN) 5-325 MG per tablet Take 1-2 tablets by mouth every 6 (six) hours as needed for moderate pain. 10/11/13   Merryl Hacker, MD  ibuprofen (ADVIL,MOTRIN) 200 MG tablet Take 400 mg by mouth every 6 (six) hours as needed for mild pain or moderate pain.    Historical Provider, MD  naproxen (NAPROSYN) 500 MG tablet Take 1 tablet (500 mg total) by mouth 2 (two) times daily. 10/20/15   Efrata Brunner C Serrita Lueth, PA-C  oxyCODONE-acetaminophen (PERCOCET/ROXICET) 5-325 MG tablet Take  1 tablet by mouth every 6 (six) hours as needed for severe pain. 10/20/15   Challis Crill C Vanesha Athens, PA-C  pantoprazole (PROTONIX) 40 MG tablet Take 1 tablet by mouth every morning. 06/08/14   Historical Provider, MD  phenazopyridine (PYRIDIUM) 200 MG tablet Take 1 tablet (200 mg total) by mouth 3 (three) times daily as needed for pain. 03/09/14   Charlesetta Shanks, MD  rosuvastatin (CRESTOR) 10 MG tablet Take 1 tablet by mouth at bedtime. 08/07/15   Historical Provider, MD   BP 140/70 mmHg  Pulse 105  Temp(Src) 97.9 F (36.6 C) (Oral)  Resp 20  SpO2 100% Physical Exam  Constitutional: She is oriented to person, place, and time. She appears well-developed and well-nourished. No distress.  HENT:  Head: Normocephalic and atraumatic.  Mouth/Throat: Oropharynx is clear and moist.  Eyes: Conjunctivae and EOM are normal. Pupils are equal, round, and reactive to light.  Neck: Normal range of motion. Neck supple.  Cardiovascular: Normal rate, regular rhythm, normal heart sounds and intact distal pulses.   Pulmonary/Chest: Effort normal and breath sounds normal. No respiratory distress.  Abdominal: Soft. There is no tenderness. There is no guarding.  Musculoskeletal: She exhibits edema and tenderness.  Swelling and tenderness to left knee with some deformity. Rotation at the left hip also noted.  Swelling and tenderness to left wrist. Tenderness and pain with ROM to right elbow. Full ROM in right lower extremity and left upper extremity (other than the left wrist). Other ROM limited by pain.   Neurological: She is alert and oriented to person, place, and time. She has normal reflexes.  No sensory deficits. Strength 5/5 in RLE, LUE, RUE, and the left ankle. Strength testing is limited in remaining joints due to pain. No gait disturbance. Coordination intact. Cranial nerves III-XII grossly intact. No facial droop.   Skin: Skin is warm and dry. She is not diaphoretic.  Abrasion noted to the posterior right elbow.  Bleeding controlled.  Psychiatric: She has a normal mood and affect. Her behavior is normal.  Nursing note and vitals reviewed.   ED Course  Procedures (including critical care time) Labs Review Labs Reviewed  BASIC METABOLIC PANEL - Abnormal; Notable for the following:    Potassium 2.8 (*)    GFR calc non Af Amer 59 (*)    All other components within normal limits  CBC WITH DIFFERENTIAL/PLATELET  PROTIME-INR  CBG MONITORING, ED    Imaging Review Dg Elbow Complete Right  10/20/2015  CLINICAL DATA:  Patient fell onto wedge ground.  Elbow pain. EXAM: RIGHT ELBOW - COMPLETE 3+ VIEW COMPARISON:  None. FINDINGS: There is no evidence of fracture, dislocation, or joint effusion. There is no evidence of arthropathy or other focal bone abnormality. Soft tissues are unremarkable. IMPRESSION: Negative. Electronically Signed   By: Misty Stanley M.D.   On: 10/20/2015 15:52   Dg Wrist Complete Left  10/20/2015  CLINICAL DATA:  65 year old female with acute left wrist pain  following fall today. Initial encounter. EXAM: LEFT WRIST - COMPLETE 3+ VIEW COMPARISON:  None. FINDINGS: There is no evidence of acute fracture, subluxation or dislocation. Mild degenerative changes at the radiocarpal joint noted. No focal bony lesions are identified. IMPRESSION: No acute bony abnormality. Electronically Signed   By: Margarette Canada M.D.   On: 10/20/2015 15:57   Ct Knee Left Wo Contrast  10/20/2015  CLINICAL DATA:  Status post fall.  Slipped on concrete. EXAM: CT OF THE LEFT KNEE WITHOUT CONTRAST TECHNIQUE: Multidetector CT imaging of the LEFT knee was performed according to the standard protocol. Multiplanar CT image reconstructions were also generated. COMPARISON:  None. FINDINGS: Bones/Joint/Cartilage Generalized osteopenia. No acute fracture or dislocation. Moderate tricompartmental osteoarthritis of the left knee most severe in the medial femorotibial compartment with joint space narrowing and marginal osteophytosis.  Normal alignment. No joint effusion. Chondrocalcinosis of the medial and lateral femorotibial compartment as can be seen with CPPD. Muscles and Tendons Muscles are normal. No muscle atrophy. No intramuscular fluid collection or hematoma. Quadriceps and patellar tendon are grossly intact. Soft tissue No fluid collection or hematoma. No soft tissue mass. Peripheral vascular atherosclerotic disease. IMPRESSION: 1. No acute osseous injury of the left knee. 2. Tricompartmental osteoarthritis of the left knee. Electronically Signed   By: Kathreen Devoid   On: 10/20/2015 17:21   Ct Hip Left Wo Contrast  10/20/2015  CLINICAL DATA:  Status post fall.  Left hip pain. EXAM: CT OF THE LEFT HIP WITHOUT CONTRAST TECHNIQUE: Multidetector CT imaging of the left hip was performed according to the standard protocol. Multiplanar CT image reconstructions were also generated. COMPARISON:  None. FINDINGS: Bones/Joint/Cartilage Generalized osteopenia. No acute fracture or dislocation. No bone destruction or periosteal reaction. Mild osteoarthritis of the left hip. Mild degenerative changes of the visualized left sacroiliac joint. No fracture or dislocation. Normal alignment. No joint effusion. Muscles and Tendons No muscle atrophy. No intramuscular fluid collection or hematoma. Enthesopathic changes at the hamstring origin. Soft tissue No fluid collection or hematoma. No soft tissue mass. No inguinal hernia. Peripheral vascular atherosclerotic disease. No inguinal hernia. IMPRESSION: 1.  No acute osseous injury of the left hip. Electronically Signed   By: Kathreen Devoid   On: 10/20/2015 17:24   Dg Knee Complete 4 Views Left  10/20/2015  CLINICAL DATA:  65 year old female with a history of fall EXAM: LEFT KNEE - COMPLETE 4+ VIEW COMPARISON:  None. FINDINGS: No acute fracture line identified. No focal soft tissue swelling. No joint effusion. No radiopaque foreign body. Degenerative changes of the knee with medial and lateral joint space  narrowing. Marginal osteophyte formation with subchondral sclerosis. Degenerative changes of the proximal fibula. Vascular calcifications. IMPRESSION: Negative for acute bony abnormality. Tricompartmental osteoarthritis Atherosclerosis. Signed, Dulcy Fanny. Earleen Newport, DO [facility] [place] [facility] Electronically Signed   By: Corrie Mckusick D.O.   On: 10/20/2015 15:51   Dg Hip Unilat With Pelvis 2-3 Views Left  10/20/2015  CLINICAL DATA:  Acute left hip pain after fall today. EXAM: DG HIP (WITH OR WITHOUT PELVIS) 2-3V LEFT COMPARISON:  None. FINDINGS: Left femoral neck has slightly unusual orientation which may simply be due to positioning, but fracture cannot be excluded. Right hip appears normal. Possible nondisplaced left acetabular fracture is noted. IMPRESSION: Possible nondisplaced left acetabular fracture. CT scan of the left hip is recommended to evaluate for possible nondisplaced femoral neck fracture. Electronically Signed   By: Marijo Conception, M.D.   On: 10/20/2015 15:55  I have personally reviewed and evaluated these images and lab results as part of my medical decision-making.   EKG Interpretation None       Medications  LORazepam (ATIVAN) injection 1 mg (1 mg Intravenous Given 10/20/15 1549)  morphine 2 MG/ML injection 1 mg (1 mg Intravenous Given 10/20/15 1548)  potassium chloride SA (K-DUR,KLOR-CON) CR tablet 40 mEq (40 mEq Oral Given 10/20/15 1816)    Followed by  potassium chloride SA (K-DUR,KLOR-CON) CR tablet 40 mEq (40 mEq Oral Given 10/20/15 1849)    MDM   Final diagnoses:  Fall, initial encounter  Hip pain, left  Knee pain, acute, left  Hypokalemia    [name] presents for evaluation following a slip and fall that occurred just prior to arrival.  Findings and plan of care discussed with Quintella Reichert, MD. Dr. Ralene Bathe personally evaluated and examined this patient.  Patient was anxious upon initial exam, but she states that  she is able to be transported to x-ray. Patient began to panic and hyperventilate while in the x-ray room. I reexamined her while she was still on the x-ray table, and found the patient to be alert and oriented, anxious, but no increased work of breathing. I suspect an increase in the patient's anxiety due to pain as the cause of her behavior. Pt was brought back to the patient care room for pretreatment of her pain and anxiety prior to attempting more imaging studies. Hip x-ray shows questionable femoral neck and acetabular fractures. Recommend CT. 5:10 PM Spoke with Dr. Percell Miller, Orthopedic surgeon, who requests a call back once the CT is returned. [phone] Return call to Dr. Percell Miller to make him aware of clear CT results. Patient was able to ambulate with crutches. Required no further pain management. Patient has a fianc, who is at the bedside, that will look after her this evening and give her any help she may need. Outpatient orthopedic follow-up. Return precautions discussed. Patient voiced understanding of all instructions and is comfortable with discharge.  Filed Vitals:   10/20/15 1500 10/20/15 1737 10/20/15 1936  BP: 140/70 150/73 124/61  Pulse: 105 69 65  Temp: 97.9 F (36.6 C) 98.1 F (36.7 C) 97.6 F (36.4 C)  TempSrc: Oral Oral Oral  Resp: 20 18 14   SpO2: 100% 99% 98%     Layla Maw 10/20/15 1955  Quintella Reichert, MD 10/21/15 [phone]

## 2015-10-20 NOTE — ED Notes (Signed)
Pt went to radiology.  Appeared to have anxiety attack with eye floaters.  Notified PA and meds ordered.

## 2015-10-20 NOTE — ED Notes (Signed)
Per EMS, pt from home.  Pt fell today.  Pt c/o left knee pain.  Pt slipped on concrete.  Pt was swimming and floor was wet.  Pt fell to knees and then hands.  No head injury.  No LOC.  Swelling noted and rotation. Also c/o left wrist swelling.   Vitals:  122/74 hr 74, resp 18,

## 2015-10-20 NOTE — Discharge Instructions (Signed)
You have been seen today for a fall. Your imaging showed no abnormalities. You were noted to have a low potassium. You will need to follow up with your PCP on this matter as soon as possible. Return to ED should symptoms worsen.

## 2015-12-04 DIAGNOSIS — M810 Age-related osteoporosis without current pathological fracture: Secondary | ICD-10-CM | POA: Diagnosis not present

## 2015-12-04 DIAGNOSIS — Z1211 Encounter for screening for malignant neoplasm of colon: Secondary | ICD-10-CM | POA: Diagnosis not present

## 2015-12-04 DIAGNOSIS — Z13820 Encounter for screening for osteoporosis: Secondary | ICD-10-CM | POA: Diagnosis not present

## 2015-12-04 DIAGNOSIS — Z13828 Encounter for screening for other musculoskeletal disorder: Secondary | ICD-10-CM | POA: Diagnosis not present

## 2015-12-04 DIAGNOSIS — R339 Retention of urine, unspecified: Secondary | ICD-10-CM | POA: Diagnosis not present

## 2015-12-04 DIAGNOSIS — Z87898 Personal history of other specified conditions: Secondary | ICD-10-CM | POA: Diagnosis not present

## 2015-12-04 DIAGNOSIS — Z124 Encounter for screening for malignant neoplasm of cervix: Secondary | ICD-10-CM | POA: Diagnosis not present

## 2015-12-04 DIAGNOSIS — Z01411 Encounter for gynecological examination (general) (routine) with abnormal findings: Secondary | ICD-10-CM | POA: Diagnosis not present

## 2015-12-12 DIAGNOSIS — F410 Panic disorder [episodic paroxysmal anxiety] without agoraphobia: Secondary | ICD-10-CM | POA: Diagnosis not present

## 2015-12-12 DIAGNOSIS — E119 Type 2 diabetes mellitus without complications: Secondary | ICD-10-CM | POA: Diagnosis not present

## 2015-12-26 DIAGNOSIS — R87622 Low grade squamous intraepithelial lesion on cytologic smear of vagina (LGSIL): Secondary | ICD-10-CM | POA: Diagnosis not present

## 2015-12-26 DIAGNOSIS — Z3202 Encounter for pregnancy test, result negative: Secondary | ICD-10-CM | POA: Diagnosis not present

## 2015-12-26 DIAGNOSIS — N893 Dysplasia of vagina, unspecified: Secondary | ICD-10-CM | POA: Diagnosis not present

## 2016-01-08 DIAGNOSIS — Z23 Encounter for immunization: Secondary | ICD-10-CM | POA: Diagnosis not present

## 2016-01-08 DIAGNOSIS — H5203 Hypermetropia, bilateral: Secondary | ICD-10-CM | POA: Diagnosis not present

## 2016-01-08 DIAGNOSIS — H353131 Nonexudative age-related macular degeneration, bilateral, early dry stage: Secondary | ICD-10-CM | POA: Diagnosis not present

## 2016-01-08 DIAGNOSIS — E119 Type 2 diabetes mellitus without complications: Secondary | ICD-10-CM | POA: Diagnosis not present

## 2016-01-28 DIAGNOSIS — R339 Retention of urine, unspecified: Secondary | ICD-10-CM | POA: Diagnosis not present

## 2016-01-28 DIAGNOSIS — N3941 Urge incontinence: Secondary | ICD-10-CM | POA: Diagnosis not present

## 2016-02-06 ENCOUNTER — Encounter (INDEPENDENT_AMBULATORY_CARE_PROVIDER_SITE_OTHER): Payer: Self-pay | Admitting: Sports Medicine

## 2016-02-06 ENCOUNTER — Ambulatory Visit (INDEPENDENT_AMBULATORY_CARE_PROVIDER_SITE_OTHER): Payer: Medicare Other | Admitting: Sports Medicine

## 2016-02-06 VITALS — BP 140/84 | HR 60 | Ht 68.0 in | Wt 180.0 lb

## 2016-02-06 DIAGNOSIS — M25512 Pain in left shoulder: Secondary | ICD-10-CM

## 2016-02-06 DIAGNOSIS — M5412 Radiculopathy, cervical region: Secondary | ICD-10-CM | POA: Diagnosis not present

## 2016-02-06 MED ORDER — HYDROCODONE-ACETAMINOPHEN 5-325 MG PO TABS: 1.0000 | ORAL_TABLET | Freq: Four times a day (QID) | ORAL | 0 refills | Status: DC | PRN

## 2016-02-06 NOTE — Patient Instructions (Signed)
1 time prescription for Hydrocodone today. We discussed that you should not drive while on this medication.

## 2016-02-06 NOTE — Progress Notes (Signed)
[name] - 65 y.o. female MRN [id number]  Date of birth: [birth year]  Office Visit Note: Visit Date: 02/06/2016 PCP: Lucia Estelle, MD Referred by: Lucia Estelle, MD  Subjective: Chief Complaint  Patient presents with  . Left Shoulder - Pain   HPI: Patient states left shoulder pain radiating into left arm.  Left hand is swollen.  Can't raise left arm above her head. Hasnt' had any sleep for 2 nights.  Took some Tylenol Arthritis. Nothing is helping with the pain.  Would like an injection?    ROS Otherwise per HPI.  Assessment & Plan: Visit Diagnoses:  1. Acute pain of left shoulder   2. Cervical radiculopathy     Plan: Findings:  Subacromial injection provided today. [facility] shoulder conditioning program.    Meds & Orders:  Meds ordered this encounter  Medications  . HYDROcodone-acetaminophen (NORCO/VICODIN) 5-325 MG tablet    Sig: Take 1 tablet by mouth every 6 (six) hours as needed for moderate pain.    Dispense:  20 tablet    Refill:  0    Orders Placed This Encounter  Procedures  . Large Joint Injection/Arthrocentesis    Follow-up: Return in about 6 weeks (around 03/19/2016) for repeat clinical exam.   Procedures: Large Joint Inj Date/Time: 02/06/2016 11:19 AM Performed by: Teresa Coombs D Authorized by: Teresa Coombs D   Site marked: the procedure site was marked   Timeout: prior to procedure the correct patient, procedure, and site was verified   Indications:  Pain and diagnostic evaluation Location:  Shoulder Site:  L subacromial bursa Prep: patient was prepped and draped in usual sterile fashion   Needle Size:  22 G Ultrasound Guidance: No   Fluoroscopic Guidance: No   Arthrogram: No Medications:  3 mL lidocaine 1 %; 40 mg methylPREDNISolone acetate 40 MG/ML Aspiration Attempted: No   Patient tolerance:  Patient tolerated the procedure well with no immediate complications    No notes on file   Clinical History: MRI Cervical Spine:  Multilevel degenerative changes most notably at C4-5. Left shoulder x-ray: Mild degenerative arthropathy of the glenohumeral & AC Joint.  She reports that she has never smoked. She does not have any smokeless tobacco history on file. No results for input(s): HGBA1C, LABURIC in the last 8760 hours.  Objective:  VS:  HT:5\' 8"  (172.7 cm)   WT:180 lb (81.6 kg)  BMI:27.4    BP:140/84  HR:60bpm  TEMP: ( )  RESP:  Physical Exam  Constitutional: She appears well-developed and well-nourished. No distress.  HENT:  Head: Normocephalic and atraumatic.  Pulmonary/Chest: Effort normal. No respiratory distress.  Neurological: She is alert.  Appropriately interactive.  Skin: Skin is warm and dry. No rash noted. She is not diaphoretic. No erythema. No pallor.  Psychiatric: She has a normal mood and affect. Her behavior is normal. Judgment and thought content normal.    Left Shoulder Exam   Comments:  Pain with Hawkins & Neer's. Internal rotation, external rotation strength intact. Pain with empty can testing but strength is intact. Pain is minimal with speeds testing. Arm squeeze test is negative & minimal pain with brachial plexus squeeze.     Imaging: No results found.  Past Medical/Family/Surgical/Social History: Medications & Allergies reviewed per EMR Patient Active Problem List   Diagnosis Date Noted  . MELENA 10/18/2008  . GASTROPARESIS 05/30/2008  . GLUCOSE INTOLERANCE 05/04/2008  . HYPERCHOLESTEROLEMIA 05/04/2008  . HYPOKALEMIA 05/04/2008  . MORBID OBESITY 05/04/2008  . ANXIETY DEPRESSION 05/04/2008  .  GERD 05/04/2008  . CONSTIPATION 05/04/2008  . HEMATEMESIS 05/04/2008  . HYPERTENSION, HX OF 05/04/2008   Past Medical History:  Diagnosis Date  . Anxiety   . Diabetes mellitus without complication ([place])   . Hypertension   . PTSD (post-traumatic stress disorder)    No family history on file. Past Surgical History:  Procedure Laterality Date  . ABDOMINAL HYSTERECTOMY      . BACK SURGERY    . CHOLECYSTECTOMY     Social History   Occupational History  . Not on file.   Social History Main Topics  . Smoking status: Never Smoker  . Smokeless tobacco: Not on file  . Alcohol use No  . Drug use: No  . Sexual activity: Not on file

## 2016-02-08 MED ORDER — LIDOCAINE HCL 1 % IJ SOLN
3.0000 mL | INTRAMUSCULAR | Status: AC | PRN
  Administered 2016-02-06: 3 mL

## 2016-02-08 MED ORDER — METHYLPREDNISOLONE ACETATE 40 MG/ML IJ SUSP
40.0000 mg | INTRAMUSCULAR | Status: AC | PRN
  Administered 2016-02-06: 40 mg via INTRA_ARTICULAR

## 2016-02-11 DIAGNOSIS — E119 Type 2 diabetes mellitus without complications: Secondary | ICD-10-CM | POA: Diagnosis not present

## 2016-02-11 DIAGNOSIS — E785 Hyperlipidemia, unspecified: Secondary | ICD-10-CM | POA: Diagnosis not present

## 2016-02-11 DIAGNOSIS — Z79899 Other long term (current) drug therapy: Secondary | ICD-10-CM | POA: Diagnosis not present

## 2016-02-11 DIAGNOSIS — F4310 Post-traumatic stress disorder, unspecified: Secondary | ICD-10-CM | POA: Diagnosis not present

## 2016-02-11 DIAGNOSIS — I10 Essential (primary) hypertension: Secondary | ICD-10-CM | POA: Diagnosis not present

## 2016-02-26 DIAGNOSIS — Z780 Asymptomatic menopausal state: Secondary | ICD-10-CM | POA: Diagnosis not present

## 2016-02-27 DIAGNOSIS — E119 Type 2 diabetes mellitus without complications: Secondary | ICD-10-CM | POA: Diagnosis not present

## 2016-02-27 DIAGNOSIS — Z79899 Other long term (current) drug therapy: Secondary | ICD-10-CM | POA: Diagnosis not present

## 2016-02-27 DIAGNOSIS — E785 Hyperlipidemia, unspecified: Secondary | ICD-10-CM | POA: Diagnosis not present

## 2016-03-12 ENCOUNTER — Encounter (INDEPENDENT_AMBULATORY_CARE_PROVIDER_SITE_OTHER): Payer: Self-pay

## 2016-03-12 ENCOUNTER — Ambulatory Visit (INDEPENDENT_AMBULATORY_CARE_PROVIDER_SITE_OTHER): Payer: Medicare Other | Admitting: Sports Medicine

## 2016-03-12 ENCOUNTER — Encounter (INDEPENDENT_AMBULATORY_CARE_PROVIDER_SITE_OTHER): Payer: Self-pay | Admitting: Sports Medicine

## 2016-03-12 VITALS — BP 127/81 | HR 70 | Ht 68.0 in | Wt 180.0 lb

## 2016-03-12 DIAGNOSIS — M5020 Other cervical disc displacement, unspecified cervical region: Secondary | ICD-10-CM

## 2016-03-12 DIAGNOSIS — M19042 Primary osteoarthritis, left hand: Secondary | ICD-10-CM | POA: Diagnosis not present

## 2016-03-12 DIAGNOSIS — G894 Chronic pain syndrome: Secondary | ICD-10-CM

## 2016-03-12 DIAGNOSIS — M25551 Pain in right hip: Secondary | ICD-10-CM

## 2016-03-12 DIAGNOSIS — M5412 Radiculopathy, cervical region: Secondary | ICD-10-CM | POA: Diagnosis not present

## 2016-03-12 DIAGNOSIS — M4722 Other spondylosis with radiculopathy, cervical region: Secondary | ICD-10-CM | POA: Diagnosis not present

## 2016-03-12 MED ORDER — DICLOFENAC SODIUM 1 % TD GEL: TRANSDERMAL | 2 refills | Status: DC

## 2016-03-12 MED ORDER — HYDROCODONE-ACETAMINOPHEN 5-325 MG PO TABS: 1.0000 | ORAL_TABLET | Freq: Four times a day (QID) | ORAL | 0 refills | Status: DC | PRN

## 2016-03-12 NOTE — Progress Notes (Signed)
[name] - 65 y.o. female MRN [id number]  Date of birth: [birth year]  Office Visit Note: Visit Date: 03/12/2016 PCP: Eloise Levels, NP Referred by: Lucia Estelle, MD  Subjective: Chief Complaint  Patient presents with  . Left Shoulder - Follow-up  . Left Hand - Follow-up  . Follow-up    Patient states injection helped.  States that right index finger and hand are swollen and unbearable.  Having right hip pain.   HPI: Patient presents with the above conditions for follow-up. She's had ongoing issues with left shoulder & neck pain. She has undergone a subacromial injection with only temporary short-term relief. She is hesitant to try epidural steroids injections that she has not responded favorably in the past. The most effective medication that she is tried has been hydrocodone & she denies any significant side effects to this medication. She denies any somnolence, constipation or drowsiness. She reports that does seem to allow her to function more on a day-to-day basis. There has not been any other medication that had provided this type of relief for her. She would like to continue with opioid therapy. ROS: Pt denies any change in bowel or bladder habits, muscle weakness, numbness or falls associated with this pain. Otherwise per HPI.   Clinical History: MRI Cervical Spine: Multilevel degenerative changes most notably at C4-5. Left shoulder x-ray: Mild degenerative arthropathy of the glenohumeral & AC Joint.  She reports that she has never smoked. She does not have any smokeless tobacco history on file.  No results for input(s): HGBA1C, LABURIC in the last 8760 hours.  Assessment & Plan: Visit Diagnoses:    ICD-9-CM ICD-10-CM   1. Chronic pain syndrome 338.4 G89.4 Ambulatory referral to Pain Clinic     HYDROcodone-acetaminophen (NORCO/VICODIN) 5-325 MG tablet  2. Pain in right hip 719.45 M25.551 Ambulatory referral to Pain Clinic  3. Cervical radiculopathy 723.4 M54.12  Ambulatory referral to Pain Clinic  4. Osteoarthritis of spine with radiculopathy, cervical region 721.0 M47.22 Ambulatory referral to Pain Clinic  5. Herniated cervical intervertebral disc 722.0 M50.20 Ambulatory referral to Pain Clinic  6. Primary osteoarthritis, left hand 715.14 M19.042 diclofenac sodium (VOLTAREN) 1 % GEL    Plan: Long discussion today with the patient regarding options for ongoing pain management. Discussed if she would like to continue with opioid therapy referral to pain management as indicated. Referral was placed today & patient provided one final prescription from our office. For the additional pain in her hand like to see how she responds to Voltaren gel. Follow-up: Return if symptoms worsen or fail to improve.  Meds:  Meds ordered this encounter  Medications  . diclofenac sodium (VOLTAREN) 1 % GEL    Sig: Apply topically to affected area qid    Dispense:  100 g    Refill:  2  . HYDROcodone-acetaminophen (NORCO/VICODIN) 5-325 MG tablet    Sig: Take 1 tablet by mouth every 6 (six) hours as needed for moderate pain.    Dispense:  30 tablet    Refill:  0   Procedures: No notes on file   Objective:  VS:  HT:5\' 8"  (172.7 cm)   WT:180 lb (81.6 kg)  BMI:27.4    BP:127/81  HR:70bpm  TEMP: ( )  RESP:  Physical Exam:  Adult female. Alert and appropriate.  In no acute distress.  Upper extremities are overall well aligned with no significant deformity. No significant swelling.  Distal pulses 2+/4. No significant bruising/ecchymosis or erythema the skin Left hand: Small  osteoarthritic bossing of the MCPs & IP joints. No limitations in range of motion. Grip strength is slightly diminished compared to the left. Neck: Limited cervical side bending & rotation with loss of terminal flexion & extension. Pain with Spurling's compression test as well as brachial plexus squeeze & arm squeeze test. Negative Hoffman's. Upper extremity sensation is intact to light touch. Upper  extremity myotomes are normal.. Imaging: No results found.  Past Medical/Family/Surgical/Social History: Medications & Allergies reviewed per EMR Patient Active Problem List   Diagnosis Date Noted  . MELENA 10/18/2008  . GASTROPARESIS 05/30/2008  . GLUCOSE INTOLERANCE 05/04/2008  . HYPERCHOLESTEROLEMIA 05/04/2008  . HYPOKALEMIA 05/04/2008  . MORBID OBESITY 05/04/2008  . ANXIETY DEPRESSION 05/04/2008  . GERD 05/04/2008  . CONSTIPATION 05/04/2008  . HEMATEMESIS 05/04/2008  . HYPERTENSION, HX OF 05/04/2008   Past Medical History:  Diagnosis Date  . Anxiety   . Diabetes mellitus without complication ([place])   . Hypertension   . PTSD (post-traumatic stress disorder)    No family history on file. Past Surgical History:  Procedure Laterality Date  . ABDOMINAL HYSTERECTOMY    . BACK SURGERY    . CHOLECYSTECTOMY     Social History   Occupational History  . Not on file.   Social History Main Topics  . Smoking status: Never Smoker  . Smokeless tobacco: Not on file  . Alcohol use No  . Drug use: No  . Sexual activity: Not on file

## 2016-03-17 ENCOUNTER — Encounter (INDEPENDENT_AMBULATORY_CARE_PROVIDER_SITE_OTHER): Payer: Self-pay | Admitting: Sports Medicine

## 2016-03-18 ENCOUNTER — Encounter (HOSPITAL_COMMUNITY): Payer: Self-pay | Admitting: Emergency Medicine

## 2016-03-18 ENCOUNTER — Emergency Department (HOSPITAL_COMMUNITY)
Admission: EM | Admit: 2016-03-18 | Discharge: 2016-03-18 | Disposition: A | Discharge status: Home / Self Care | Payer: Medicare Other | Attending: Emergency Medicine | Admitting: Emergency Medicine

## 2016-03-18 ENCOUNTER — Emergency Department (HOSPITAL_COMMUNITY): Payer: Medicare Other

## 2016-03-18 DIAGNOSIS — I10 Essential (primary) hypertension: Secondary | ICD-10-CM | POA: Diagnosis not present

## 2016-03-18 DIAGNOSIS — F419 Anxiety disorder, unspecified: Secondary | ICD-10-CM | POA: Insufficient documentation

## 2016-03-18 DIAGNOSIS — R079 Chest pain, unspecified: Secondary | ICD-10-CM | POA: Diagnosis not present

## 2016-03-18 DIAGNOSIS — E119 Type 2 diabetes mellitus without complications: Secondary | ICD-10-CM | POA: Insufficient documentation

## 2016-03-18 DIAGNOSIS — R519 Headache, unspecified: Secondary | ICD-10-CM

## 2016-03-18 DIAGNOSIS — R51 Headache: Secondary | ICD-10-CM | POA: Diagnosis not present

## 2016-03-18 DIAGNOSIS — R0789 Other chest pain: Secondary | ICD-10-CM | POA: Diagnosis not present

## 2016-03-18 HISTORY — DX: Hyperlipidemia, unspecified: E78.5

## 2016-03-18 LAB — BASIC METABOLIC PANEL
Anion gap: 10 (ref 5–15)
BUN: 19 mg/dL (ref 6–20)
CO2: 27 mmol/L (ref 22–32)
Calcium: 9.6 mg/dL (ref 8.9–10.3)
Chloride: 102 mmol/L (ref 101–111)
Creatinine, Ser: 1.02 mg/dL — ABNORMAL HIGH (ref 0.44–1.00)
GFR calc Af Amer: >60 mL/min (ref >60)
GFR calc non Af Amer: 57 mL/min — ABNORMAL LOW (ref >60)
Glucose, Bld: 139 mg/dL — ABNORMAL HIGH (ref 65–99)
Potassium: 4.2 mmol/L (ref 3.5–5.1)
Sodium: 139 mmol/L (ref 135–145)

## 2016-03-18 LAB — CBC
HCT: 41.8 % (ref 36.0–46.0)
Hemoglobin: 14.4 g/dL (ref 12.0–15.0)
MCH: 32.8 pg (ref 26.0–34.0)
MCHC: 34.4 g/dL (ref 30.0–36.0)
MCV: 95.2 fL (ref 78.0–100.0)
Platelets: 268 10*3/uL (ref 150–400)
RBC: 4.39 MIL/uL (ref 3.87–5.11)
RDW: 12.7 % (ref 11.5–15.5)
WBC: 5.9 10*3/uL (ref 4.0–10.5)

## 2016-03-18 LAB — I-STAT TROPONIN, ED: Troponin i, poc: 0 ng/mL (ref 0.00–0.08)

## 2016-03-18 MED ORDER — LORAZEPAM 0.5 MG PO TABS
0.5000 mg | ORAL_TABLET | Freq: Once | ORAL | Status: AC
  Administered 2016-03-18: 0.5 mg via ORAL
  Filled 2016-03-18: qty 1

## 2016-03-18 NOTE — ED Provider Notes (Signed)
[facility] DEPT Provider Note   CSN: [id number] Arrival date & time: 03/18/16  2025  By signing my name below, I, Gwenlyn Fudge, attest that this documentation has been prepared under the direction and in the presence of Junius Creamer, NP. Electronically Signed: Gwenlyn Fudge, ED Scribe. 03/18/16. 10:33 PM.  History   Chief Complaint Chief Complaint  Patient presents with  . Chest Pain  . Anxiety   The history is provided by the patient. No language interpreter was used.    HPI Comments: [name] is a 65 y.o. female with  PMHx of Anxiety, DM, HLD, HTN, and PTSD who presents to the Emergency Department complaining of gradual onset, constant, moderate left sided chest pain onset 7:30 PM tonight. At 7:30, she began to experience a headache, nausea, and chest pain.  Pt has never had similar pain or symptoms before. She reports associated anxiety. Pt lifted a heavy and cumbersome tote, but did not feel a strain in her chest. Klonopin provided no relief to anxiety, but she also took 5 mg Hydrocodone which provided significant relief to her headache. Pt has been provided other medications for anxiety and PTSD, but does not tolerate the side effects. Denies shortness of breath and fever.  Past Medical History:  Diagnosis Date  . Anxiety   . Diabetes mellitus without complication ([place])   . Hyperlipidemia   . Hypertension   . PTSD (post-traumatic stress disorder)     Patient Active Problem List   Diagnosis Date Noted  . MELENA 10/18/2008  . GASTROPARESIS 05/30/2008  . GLUCOSE INTOLERANCE 05/04/2008  . HYPERCHOLESTEROLEMIA 05/04/2008  . HYPOKALEMIA 05/04/2008  . MORBID OBESITY 05/04/2008  . ANXIETY DEPRESSION 05/04/2008  . GERD 05/04/2008  . CONSTIPATION 05/04/2008  . HEMATEMESIS 05/04/2008  . HYPERTENSION, HX OF 05/04/2008    Past Surgical History:  Procedure Laterality Date  . ABDOMINAL HYSTERECTOMY    . ANKLE SURGERY    . BACK SURGERY    . CHOLECYSTECTOMY    . NOSE  SURGERY     x 2     OB History    No data available     Home Medications    Prior to Admission medications   Medication Sig Start Date End Date Taking? Authorizing Provider  clonazePAM (KLONOPIN) 1 MG tablet Take 1 mg by mouth 2 (two) times daily as needed for anxiety.    Yes Historical Provider, MD  dapagliflozin propanediol (FARXIGA) 10 MG TABS tablet Take 1 tablet by mouth daily.   Yes Historical Provider, MD  diclofenac sodium (VOLTAREN) 1 % GEL Apply topically to affected area qid 03/12/16  Yes Gerda Diss, DO  FLUoxetine (PROZAC) 20 MG capsule TAKE ONE CAPSULE BY MOUTH EVERY MORNING 01/07/16  Yes Historical Provider, MD  gabapentin (NEURONTIN) 400 MG capsule Take 800 mg by mouth at bedtime.  03/07/16  Yes Historical Provider, MD  hydrochlorothiazide (HYDRODIURIL) 25 MG tablet Take 1 tablet by mouth daily. 08/09/14  Yes Historical Provider, MD  HYDROcodone-acetaminophen (NORCO/VICODIN) 5-325 MG tablet Take 1 tablet by mouth every 6 (six) hours as needed for moderate pain. 03/12/16  Yes Gerda Diss, DO  pantoprazole (PROTONIX) 40 MG tablet Take 1 tablet by mouth every morning. 06/08/14  Yes Historical Provider, MD  rosuvastatin (CRESTOR) 10 MG tablet TAKE ONE TABLET BY MOUTH DAILY 02/10/16  Yes Historical Provider, MD  busPIRone (BUSPAR) 15 MG tablet  01/16/16   Historical Provider, MD  cyclobenzaprine (FLEXERIL) 10 MG tablet Take 1 tablet (10 mg total) by  mouth 3 (three) times daily as needed for muscle spasms. Patient not taking: Reported on 03/18/2016 08/21/15   Maryan Puls, MD  dapagliflozin propanediol (FARXIGA) 10 MG TABS tablet TAKE 1 TABLET EVERY DAY 02/18/16   Historical Provider, MD  FLUoxetine (PROZAC) 20 MG capsule Take 1 capsule by mouth daily. 07/31/15   Historical Provider, MD  fluticasone (FLONASE) 50 MCG/ACT nasal spray Place 1 spray into both nostrils 2 (two) times daily as needed for allergies.  06/08/14   Historical Provider, MD  gabapentin (NEURONTIN) 800 MG tablet  Take 800-1,600 mg by mouth 2 (two) times daily. 800 mg by mouth in the morning and 1,600 mg at bedtime    Historical Provider, MD  hydrochlorothiazide (HYDRODIURIL) 25 MG tablet TAKE 1 TABLET BY MOUTH ONCE DAILY 02/10/16   Historical Provider, MD  ibuprofen (ADVIL,MOTRIN) 200 MG tablet Take 400 mg by mouth every 6 (six) hours as needed for mild pain or moderate pain.    Historical Provider, MD  naproxen (NAPROSYN) 500 MG tablet Take 1 tablet (500 mg total) by mouth 2 (two) times daily. Patient not taking: Reported on 03/18/2016 10/20/15   Shawn C Joy, PA-C  phenazopyridine (PYRIDIUM) 200 MG tablet Take 1 tablet (200 mg total) by mouth 3 (three) times daily as needed for pain. Patient not taking: Reported on 03/18/2016 03/09/14   Charlesetta Shanks, MD  rosuvastatin (CRESTOR) 10 MG tablet Take 1 tablet by mouth at bedtime. 08/07/15   Historical Provider, MD    Family History No family history on file.  Social History Social History  Substance Use Topics  . Smoking status: Never Smoker  . Smokeless tobacco: Never Used  . Alcohol use No    Allergies   Penicillins  Review of Systems Review of Systems  Constitutional: Negative for fever.  Respiratory: Negative for shortness of breath.   Cardiovascular: Positive for chest pain.  Gastrointestinal: Positive for nausea.  Neurological: Positive for headaches.  Psychiatric/Behavioral: The patient is nervous/anxious.   All other systems reviewed and are negative.  Physical Exam Updated Vital Signs BP 127/70   Pulse 64   Temp 97.7 F (36.5 C) (Oral)   Resp 12   Ht 5\' 8"  (1.727 m)   Wt 79.4 kg   SpO2 97%   BMI 26.61 kg/m   Physical Exam  Constitutional: She is oriented to person, place, and time. She appears well-developed and well-nourished. She is active. No distress.  HENT:  Head: Normocephalic and atraumatic.  Eyes: Conjunctivae are normal.  Cardiovascular: Normal rate.   Pulmonary/Chest: Effort normal. No respiratory distress. She  exhibits tenderness.  Musculoskeletal: Normal range of motion.  Neurological: She is alert and oriented to person, place, and time.  Skin: Skin is warm and dry.  Psychiatric: Her behavior is normal. Her mood appears anxious.  Nursing note and vitals reviewed.  ED Treatments / Results  DIAGNOSTIC STUDIES: Oxygen Saturation is 100% on RA, normal by my interpretation.    COORDINATION OF CARE: 10:26 PM Discussed treatment plan with pt at bedside which includes cardiac monitoring and pt agreed to plan.  Labs (all labs ordered are listed, but only abnormal results are displayed) Labs Reviewed  BASIC METABOLIC PANEL - Abnormal; Notable for the following:       Result Value   Glucose, Bld 139 (*)    Creatinine, Ser 1.02 (*)    GFR calc non Af Amer 57 (*)    All other components within normal limits  CBC  I-STAT TROPOININ, ED  EKG  EKG Interpretation None       Radiology Dg Chest 2 View  Result Date: 03/18/2016 CLINICAL DATA:  Chest pain and headache. EXAM: CHEST  2 VIEW COMPARISON:  Chest radiograph 04/29/2006 FINDINGS: Unchanged cardiomediastinal contours. No focal airspace consolidation or pulmonary edema. No pneumothorax or pleural effusion. IMPRESSION: Clear lungs. Electronically Signed   By: Ulyses Jarred M.D.   On: 03/18/2016 21:25   Procedures Procedures (including critical care time)  Medications Ordered in ED Medications  LORazepam (ATIVAN) tablet 0.5 mg (0.5 mg Oral Given 03/18/16 2312)    Initial Impression / Assessment and Plan / ED Course  I have reviewed the triage vital signs and the nursing notes.  Pertinent labs & imaging results that were available during my care of the patient were reviewed by me and considered in my medical decision making (see chart for details).  Clinical Course   EKG Troponin normal pateint extremely anxious but is feeling better a she took 2 mg Klonopin PTA  Reassured  I personally performed the services described in this  documentation, which was scribed in my presence. The recorded information has been reviewed and is accurate.  Final Clinical Impressions(s) / ED Diagnoses   Final diagnoses:  Anxiety  Chest pain, unspecified type  Acute nonintractable headache, unspecified headache type    New Prescriptions New Prescriptions   No medications on file     Junius Creamer, NP 03/18/16 2326    Junius Creamer, NP 03/18/16 [place], MD 03/22/16 [id number]

## 2016-03-18 NOTE — Discharge Instructions (Signed)
Make an appointment with your PCP for follow up

## 2016-03-18 NOTE — ED Triage Notes (Signed)
Pt comes in with complaints of a headache and nausea that began about a hour ago.  States she had a "wave" like sensation occur four different times over her left chest.  States she also has anxiety. Hx of HTN, and hyperlipidemia.  States pain/"wave" is not present at this time.

## 2016-03-18 NOTE — ED Notes (Signed)
Patient reports suffering from anxiety and being under more stress than normal. Took her regula medication today, but had no effect on her today.

## 2016-03-27 ENCOUNTER — Telehealth (INDEPENDENT_AMBULATORY_CARE_PROVIDER_SITE_OTHER): Payer: Self-pay | Admitting: *Deleted

## 2016-03-27 NOTE — Telephone Encounter (Signed)
Talked with patient and advised her that referral is currently pending for review and could take about 4 weeks.

## 2016-03-27 NOTE — Telephone Encounter (Signed)
Pt called stating she got a referral for pain management but hasnt gotten a call from anyone yet and wasn't sure which office she had been referred to.

## 2016-04-16 DIAGNOSIS — M25512 Pain in left shoulder: Secondary | ICD-10-CM | POA: Diagnosis not present

## 2016-04-16 DIAGNOSIS — G8929 Other chronic pain: Secondary | ICD-10-CM | POA: Diagnosis not present

## 2016-04-16 DIAGNOSIS — M25551 Pain in right hip: Secondary | ICD-10-CM | POA: Diagnosis not present

## 2016-04-16 DIAGNOSIS — F4310 Post-traumatic stress disorder, unspecified: Secondary | ICD-10-CM | POA: Diagnosis not present

## 2016-04-16 DIAGNOSIS — M79642 Pain in left hand: Secondary | ICD-10-CM | POA: Diagnosis not present

## 2016-05-12 ENCOUNTER — Encounter: Payer: Medicare Other | Attending: Physical Medicine & Rehabilitation | Admitting: Physical Medicine & Rehabilitation

## 2016-05-12 ENCOUNTER — Encounter: Payer: Self-pay | Admitting: Physical Medicine & Rehabilitation

## 2016-05-12 VITALS — BP 124/81 | HR 97

## 2016-05-12 DIAGNOSIS — M542 Cervicalgia: Secondary | ICD-10-CM | POA: Insufficient documentation

## 2016-05-12 DIAGNOSIS — I10 Essential (primary) hypertension: Secondary | ICD-10-CM | POA: Diagnosis not present

## 2016-05-12 DIAGNOSIS — M545 Low back pain: Secondary | ICD-10-CM | POA: Insufficient documentation

## 2016-05-12 DIAGNOSIS — M5030 Other cervical disc degeneration, unspecified cervical region: Secondary | ICD-10-CM | POA: Insufficient documentation

## 2016-05-12 DIAGNOSIS — F329 Major depressive disorder, single episode, unspecified: Secondary | ICD-10-CM | POA: Diagnosis not present

## 2016-05-12 DIAGNOSIS — M25512 Pain in left shoulder: Secondary | ICD-10-CM | POA: Diagnosis not present

## 2016-05-12 DIAGNOSIS — E119 Type 2 diabetes mellitus without complications: Secondary | ICD-10-CM | POA: Diagnosis not present

## 2016-05-12 DIAGNOSIS — G2581 Restless legs syndrome: Secondary | ICD-10-CM

## 2016-05-12 DIAGNOSIS — Z79899 Other long term (current) drug therapy: Secondary | ICD-10-CM | POA: Diagnosis not present

## 2016-05-12 DIAGNOSIS — E785 Hyperlipidemia, unspecified: Secondary | ICD-10-CM | POA: Insufficient documentation

## 2016-05-12 DIAGNOSIS — M47812 Spondylosis without myelopathy or radiculopathy, cervical region: Secondary | ICD-10-CM | POA: Diagnosis not present

## 2016-05-12 DIAGNOSIS — M75102 Unspecified rotator cuff tear or rupture of left shoulder, not specified as traumatic: Secondary | ICD-10-CM | POA: Diagnosis not present

## 2016-05-12 DIAGNOSIS — F4310 Post-traumatic stress disorder, unspecified: Secondary | ICD-10-CM | POA: Insufficient documentation

## 2016-05-12 DIAGNOSIS — Z5181 Encounter for therapeutic drug level monitoring: Secondary | ICD-10-CM | POA: Diagnosis not present

## 2016-05-12 DIAGNOSIS — G8929 Other chronic pain: Secondary | ICD-10-CM

## 2016-05-12 DIAGNOSIS — R102 Pelvic and perineal pain: Secondary | ICD-10-CM | POA: Insufficient documentation

## 2016-05-12 DIAGNOSIS — T1490XA Injury, unspecified, initial encounter: Secondary | ICD-10-CM | POA: Diagnosis not present

## 2016-05-12 DIAGNOSIS — M19042 Primary osteoarthritis, left hand: Secondary | ICD-10-CM | POA: Diagnosis not present

## 2016-05-12 NOTE — Patient Instructions (Addendum)
.  ONCE I HAVE CONFIRMATION THAT YOUR URINE SPECIMEN IS CONSISTENT WITH YOUR HISTORY AND PRESCRIBED MEDICATIONS, I WILL BE WILLING TO PRESCRIBE YOUR PAIN MEDICATION. THE RESULTS OF YOUR URINE TESTING COULD TAKE A WEEK OR MORE TO RETURN, HOWEVER.  IF WE DO NOT CONTACT YOU REGARDING THESE RESULTS WITHIN 10 DAYS, PLEASE CONTACT us.    Spica splint left wrist

## 2016-05-12 NOTE — Addendum Note (Signed)
Addended by: Marland Mcalpine B on: 05/12/2016 03:59 PM   Modules accepted: Orders
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
## 2016-05-14 DIAGNOSIS — F331 Major depressive disorder, recurrent, moderate: Secondary | ICD-10-CM | POA: Diagnosis not present

## 2016-05-15 ENCOUNTER — Encounter: Payer: Self-pay | Admitting: Physical Therapy

## 2016-05-15 ENCOUNTER — Ambulatory Visit: Payer: Medicare Other | Attending: Physical Medicine & Rehabilitation | Admitting: Physical Therapy

## 2016-05-15 DIAGNOSIS — M62838 Other muscle spasm: Secondary | ICD-10-CM

## 2016-05-15 DIAGNOSIS — G8929 Other chronic pain: Secondary | ICD-10-CM | POA: Diagnosis not present

## 2016-05-15 DIAGNOSIS — M25532 Pain in left wrist: Secondary | ICD-10-CM | POA: Diagnosis not present

## 2016-05-15 DIAGNOSIS — M542 Cervicalgia: Secondary | ICD-10-CM

## 2016-05-15 DIAGNOSIS — M25512 Pain in left shoulder: Secondary | ICD-10-CM | POA: Diagnosis not present

## 2016-05-15 DIAGNOSIS — M6281 Muscle weakness (generalized): Secondary | ICD-10-CM
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
## 2016-05-17 LAB — TOXASSURE SELECT,+ANTIDEPR,UR

## 2016-05-19 ENCOUNTER — Telehealth: Payer: Self-pay | Admitting: Physical Medicine & Rehabilitation

## 2016-05-19 ENCOUNTER — Telehealth: Payer: Self-pay

## 2016-05-19 ENCOUNTER — Ambulatory Visit: Payer: Medicare Other | Admitting: Physical Therapy

## 2016-05-19 DIAGNOSIS — M25532 Pain in left wrist: Secondary | ICD-10-CM | POA: Diagnosis not present

## 2016-05-19 DIAGNOSIS — M542 Cervicalgia: Secondary | ICD-10-CM

## 2016-05-19 DIAGNOSIS — M6281 Muscle weakness (generalized): Secondary | ICD-10-CM

## 2016-05-19 DIAGNOSIS — G8929 Other chronic pain: Secondary | ICD-10-CM | POA: Diagnosis not present

## 2016-05-19 DIAGNOSIS — M25512 Pain in left shoulder: Secondary | ICD-10-CM | POA: Diagnosis not present

## 2016-05-19 DIAGNOSIS — M62838 Other muscle spasm: Secondary | ICD-10-CM

## 2016-05-19 DIAGNOSIS — G894 Chronic pain syndrome: Secondary | ICD-10-CM

## 2016-05-19 MED ORDER — HYDROCODONE-ACETAMINOPHEN 5-325 MG PO TABS: 1.0000 | ORAL_TABLET | Freq: Four times a day (QID) | ORAL | 0 refills | Status: DC | PRN

## 2016-05-19 NOTE — Therapy (Signed)
[place], [place], [address] Phone: [phone]   Fax:  [phone]  Physical Therapy Treatment  Patient Details  Name: [name] MRN: [id number] Date of Birth: 1950/12/21 Referring Provider: Dr. Alger Simons  Encounter Date: 05/19/2016      PT End of Session - 05/19/16 1711    Visit Number 2   Number of Visits 16   Date for PT Re-Evaluation 07/10/16   PT Start Time [id number]   PT Stop Time 1517   PT Time Calculation (min) 60 min   Activity Tolerance Patient tolerated treatment well   Behavior During Therapy [facility] for tasks assessed/performed      Past Medical History:  Diagnosis Date  . Anxiety   . Diabetes mellitus without complication ([place])   . Hyperlipidemia   . Hypertension   . PTSD (post-traumatic stress disorder)     Past Surgical History:  Procedure Laterality Date  . ABDOMINAL HYSTERECTOMY    . ANKLE SURGERY    . BACK SURGERY    . CHOLECYSTECTOMY    . NOSE SURGERY     x 2     There were no vitals filed for this visit.      Subjective Assessment - 05/19/16 1420    Subjective IFC lasted about 2 days.     Currently in Pain? Yes   Pain Score 7    Pain Location Neck   Pain Orientation Left;Lateral;Posterior   Pain Descriptors / Indicators Aching;Tightness;Dull;Headache   Pain Type Chronic pain   Pain Radiating Towards into elbow then skips to thumb side.   Pain Frequency Constant   Aggravating Factors  turning head   Pain Relieving Factors meds, stretching,  IFC   Effect of Pain on Daily Activities painful with ADL,, Extra time required for ADL's   Pain Score 7   Pain Location Shoulder   Pain Orientation Left   Pain Descriptors / Indicators Sore;Aching;Dull   Pain Type Chronic pain   Pain Onset More than a month ago   Pain Frequency Constant   Aggravating Factors  lifting arm,  using left arm    Pain Relieving Factors avoids using left arm sometimesa,  rest hot towels                          [facility] Adult PT Treatment/Exercise - 05/19/16 0001      Self-Care   Other Self-Care Comments  TENs for home use lengthy discussion about where to get, cost involved etc.  Patient is going to price shop and will probably get a low cost unit.       Exercises   Exercises --  time spent with exercise ed. Goal is to start gentle No pain     Neck Exercises: Seated   Other Seated Exercise Neck AROM,  AAROM with use of hands to support weight of head,  painful end range.  flexion, sidebends , flexion and extension, rotation 5x each     Neck Exercises: Supine   Neck Retraction 5 reps     Moist Heat Therapy   Number Minutes Moist Heat 20 Minutes   Moist Heat Location Cervical;Shoulder  Concurrent with IFC     Electrical Stimulation   Electrical Stimulation Location L lateral neck and shoulder   Electrical Stimulation Action IFC   Electrical Stimulation Parameters 12   Electrical Stimulation Goals Pain  PT Education - 05/19/16 1710    Education provided Yes   Education Details How to exercise,  TENS units where to get,  what to look for, etc   Person(s) Educated Patient   Methods Explanation   Comprehension Verbalized understanding          PT Short Term Goals - 05/19/16 1714      PT SHORT TERM GOAL #1   Title Pt will be I with HEP for neck and shoulder, hand   Baseline heavy cues   Time 4   Period Weeks   Status On-going     PT SHORT TERM GOAL #2   Title Pt will be able to demo cervical AROM with 10 deg improvement in all planes with pain no more than moderate.    Time 4   Period Weeks   Status Unable to assess     PT SHORT TERM GOAL #3   Title Pt will be able to reach Lt UE to 120 deg flexion for more effective home tasks, reaching and ADLs   Time 4   Period Weeks   Status Unable to assess     PT SHORT TERM GOAL #4   Title Pt will be able to grip with L hand light items in the home for crafting,  eating, etc and pain minimal.    Time 4   Status Unable to assess           PT Long Term Goals - 05/15/16 1557      PT LONG TERM GOAL #1   Title Pt will be I with concepts of posture, body mechanics, and RICE for prevention of further disability.    Time 8   Period Weeks   Status New     PT LONG TERM GOAL #2   Title Pt will be able to lift, carry mod sized items in her home with min increase in pain from baseline.    Time 8   Period Weeks   Status New     PT LONG TERM GOAL #3   Title Pt will demo 4+/5 or more in L UE with min pain for improved function, lifting, painting.    Time 8   Period Weeks   Status New     PT LONG TERM GOAL #4   Title Pt will be able to sleep with less waking episodes most days of the week.    Time 8   Period Weeks   Status New     PT LONG TERM GOAL #5   Title Pt will demo normal AROM in cervical spine with stretch, no pain increase.    Time 8   Period Weeks   Status New               Plan - 05/19/16 1711    Clinical Impression Statement Patient will get her TENS on her own since insurance will not cover.  Pain increased with exercise despite many heavy cues to change her technique.  Modalities helpful.  Pain is temporarity improved with modalities.    PT Next Visit Plan develop HEP, repeat IFC, C AROM, ionto for wrist and shoulder    PT Home Exercise Plan Cervical ROM   Consulted and Agree with Plan of Care Patient      Patient will benefit from skilled therapeutic intervention in order to improve the following deficits and impairments:  Increased fascial restricitons, Pain, Decreased activity tolerance, Impaired flexibility, Postural dysfunction, Improper body mechanics, Impaired sensation,  Decreased strength, Decreased mobility, Increased muscle spasms, Impaired UE functional use, Decreased range of motion  Visit Diagnosis: Cervicalgia  Muscle weakness (generalized)  Chronic left shoulder pain  Pain in left wrist  Other  muscle spasm     Problem List Patient Active Problem List   Diagnosis Date Noted  . Left shoulder pain 05/12/2016  . Cervical spondylosis without myelopathy 05/12/2016  . Rotator cuff syndrome, left 05/12/2016  . Restless leg syndrome 05/12/2016  . Primary osteoarthritis, left hand 05/12/2016  . MELENA 10/18/2008  . GASTROPARESIS 05/30/2008  . GLUCOSE INTOLERANCE 05/04/2008  . HYPERCHOLESTEROLEMIA 05/04/2008  . HYPOKALEMIA 05/04/2008  . MORBID OBESITY 05/04/2008  . ANXIETY DEPRESSION 05/04/2008  . GERD 05/04/2008  . CONSTIPATION 05/04/2008  . HEMATEMESIS 05/04/2008  . HYPERTENSION, HX OF 05/04/2008    HARRIS,KAREN PTA 05/19/2016, 5:16 PM  [place], [place], [address] Phone: [phone]   Fax:  [phone]  Name: [name] MRN: [id number] Date of Birth: June 25, 1950

## 2016-05-19 NOTE — Telephone Encounter (Signed)
Ptn inquiring about RX because of UDS waiting

## 2016-05-19 NOTE — Patient Instructions (Signed)
Beginning exercises should be gently and should not feel like a bat has just smacked your shoulder.

## 2016-05-19 NOTE — Telephone Encounter (Signed)
Patient called requesting lab results on her UDS in order to pick up a prescription for pain medication, uds came back positive for LORAZEPAM, no mention of patient being on this medication on her medication list, notes, or in [place] in last 2 years, patient is currently NOT under contract with [place] at this time, please advise

## 2016-05-19 NOTE — Telephone Encounter (Signed)
She is taking clonazepam. Please follow up on the source of the lorazepam. I am willing to write #[phone] hydrocodone for now

## 2016-05-19 NOTE — Telephone Encounter (Signed)
I spoke with [name] Pulse and she admits that she has taken one of her mother's Lorazepam because she was out of her clonazepam.  She will not do it again and is aware going forward she is not to take any unprescribed medication or share her medication with anyone.  She will pick up her RX and sign CSA.

## 2016-05-20 DIAGNOSIS — L720 Epidermal cyst: Secondary | ICD-10-CM | POA: Diagnosis not present

## 2016-05-20 DIAGNOSIS — L821 Other seborrheic keratosis: Secondary | ICD-10-CM | POA: Diagnosis not present

## 2016-05-20 DIAGNOSIS — Z85828 Personal history of other malignant neoplasm of skin: Secondary | ICD-10-CM | POA: Diagnosis not present

## 2016-05-21 ENCOUNTER — Ambulatory Visit: Payer: Medicare Other | Admitting: Physical Therapy

## 2016-05-21 DIAGNOSIS — M62838 Other muscle spasm: Secondary | ICD-10-CM | POA: Diagnosis not present

## 2016-05-21 DIAGNOSIS — M542 Cervicalgia: Secondary | ICD-10-CM | POA: Diagnosis not present

## 2016-05-21 DIAGNOSIS — G8929 Other chronic pain: Secondary | ICD-10-CM

## 2016-05-21 DIAGNOSIS — M25512 Pain in left shoulder: Secondary | ICD-10-CM

## 2016-05-21 DIAGNOSIS — M6281 Muscle weakness (generalized): Secondary | ICD-10-CM

## 2016-05-21 DIAGNOSIS — M25532 Pain in left wrist: Secondary | ICD-10-CM

## 2016-05-21 NOTE — Therapy (Signed)
[place], [place], [address] Phone: [phone]   Fax:  [phone]  Physical Therapy Treatment  Patient Details  Name: [name] MRN: [id number] Date of Birth: 09/05/1950 Referring Provider: Dr. Alger Simons  Encounter Date: 05/21/2016      PT End of Session - 05/21/16 1828    Visit Number 3   Number of Visits 16   Date for PT Re-Evaluation 07/10/16   PT Start Time 1332   PT Stop Time 1430   PT Time Calculation (min) 58 min   Activity Tolerance Patient tolerated treatment well   Behavior During Therapy [facility] for tasks assessed/performed      Past Medical History:  Diagnosis Date  . Anxiety   . Diabetes mellitus without complication ([place])   . Hyperlipidemia   . Hypertension   . PTSD (post-traumatic stress disorder)     Past Surgical History:  Procedure Laterality Date  . ABDOMINAL HYSTERECTOMY    . ANKLE SURGERY    . BACK SURGERY    . CHOLECYSTECTOMY    . NOSE SURGERY     x 2     There were no vitals filed for this visit.      Subjective Assessment - 05/21/16 1333    Subjective Had a mild day yesterday.   Did gentle exercises 4 X a day.  I already see the improvement.  Arm pain centralized.   Currently in Pain? Yes   Pain Score --  mild/ moderate   Pain Location Neck   Pain Radiating Towards shoulder   Pain Frequency Constant   Aggravating Factors  turning head   Pain Relieving Factors stretching, posture change,  meds,  IFC,  Heat   Effect of Pain on Daily Activities limits ADL   Pain Score --  moderate   Pain Location Shoulder   Pain Orientation Left   Pain Descriptors / Indicators Aching;Sore   Pain Type Chronic pain   Pain Frequency Constant   Aggravating Factors  reaching reaching   Pain Relieving Factors heAT  stertches            [facility] PT Assessment - 05/21/16 0001      AROM   Left Shoulder Flexion 110 Degrees                     [facility]  Adult PT Treatment/Exercise - 05/21/16 0001      Elbow Exercises   Elbow Flexion 10 reps   Elbow Flexion Limitations AROM   Elbow Extension 10 reps  HEP   Elbow Extension Limitations AROM   Forearm Supination 10 reps   Forearm Supination Limitations sore  AROM   Forearm Pronation 10 reps   Forearm Pronation Limitations sore   Wrist Flexion 10 reps   Wrist Flexion Limitations AROM, HEP  on side vs anatomical position   Wrist Extension 10 reps   Wrist Extension Limitations AROM,  HEP   Other elbow exercises finger O's,  radial deviation wrist,  , finger abduction,  thumb circles,   thumb abedction 10 x each     Moist Heat Therapy   Number Minutes Moist Heat 20 Minutes   Moist Heat Location Cervical;Shoulder  concurrent with IFC     Electrical Stimulation   Electrical Stimulation Location Neck, shoulder   Electrical Stimulation Action IFC    Electrical Stimulation Parameters 12   Electrical Stimulation Goals Pain     Manual Therapy   Manual therapy comments  soft tissue work peri shoulder scapula.  tenderness eased some with light manual.                 PT Education - 05/21/16 1827    Education provided Yes   Education Details HEP   Person(s) Educated Patient   Methods Explanation;Demonstration;Tactile cues;Verbal cues;Handout   Comprehension Verbalized understanding;Returned demonstration          PT Short Term Goals - 05/19/16 1714      PT SHORT TERM GOAL #1   Title Pt will be I with HEP for neck and shoulder, hand   Baseline heavy cues   Time 4   Period Weeks   Status On-going     PT SHORT TERM GOAL #2   Title Pt will be able to demo cervical AROM with 10 deg improvement in all planes with pain no more than moderate.    Time 4   Period Weeks   Status Unable to assess     PT SHORT TERM GOAL #3   Title Pt will be able to reach Lt UE to 120 deg flexion for more effective home tasks, reaching and ADLs   Time 4   Period Weeks   Status Unable to  assess     PT SHORT TERM GOAL #4   Title Pt will be able to grip with L hand light items in the home for crafting, eating, etc and pain minimal.    Time 4   Status Unable to assess           PT Long Term Goals - 05/15/16 1557      PT LONG TERM GOAL #1   Title Pt will be I with concepts of posture, body mechanics, and RICE for prevention of further disability.    Time 8   Period Weeks   Status New     PT LONG TERM GOAL #2   Title Pt will be able to lift, carry mod sized items in her home with min increase in pain from baseline.    Time 8   Period Weeks   Status New     PT LONG TERM GOAL #3   Title Pt will demo 4+/5 or more in L UE with min pain for improved function, lifting, painting.    Time 8   Period Weeks   Status New     PT LONG TERM GOAL #4   Title Pt will be able to sleep with less waking episodes most days of the week.    Time 8   Period Weeks   Status New     PT LONG TERM GOAL #5   Title Pt will demo normal AROM in cervical spine with stretch, no pain increase.    Time 8   Period Weeks   Status New               Plan - 05/21/16 1828    Clinical Impression Statement Progress toward HEP goals.  patient is working on her posture and her exercises.  pain had already decreased .  Shoulder flexion improved 5 degrees.   PT Next Visit Plan issue shoulder ex... cane,  Isometric??  review new HEP   PT Home Exercise Plan Cervical ROM,  Elbow, wrisr hand AROM   Consulted and Agree with Plan of Care Patient      Patient will benefit from skilled therapeutic intervention in order to improve the following deficits and impairments:  Increased fascial restricitons, Pain, Decreased activity  tolerance, Impaired flexibility, Postural dysfunction, Improper body mechanics, Impaired sensation, Decreased strength, Decreased mobility, Increased muscle spasms, Impaired UE functional use, Decreased range of motion  Visit Diagnosis: Cervicalgia  Muscle weakness  (generalized)  Chronic left shoulder pain  Pain in left wrist  Other muscle spasm     Problem List Patient Active Problem List   Diagnosis Date Noted  . Left shoulder pain 05/12/2016  . Cervical spondylosis without myelopathy 05/12/2016  . Rotator cuff syndrome, left 05/12/2016  . Restless leg syndrome 05/12/2016  . Primary osteoarthritis, left hand 05/12/2016  . MELENA 10/18/2008  . GASTROPARESIS 05/30/2008  . GLUCOSE INTOLERANCE 05/04/2008  . HYPERCHOLESTEROLEMIA 05/04/2008  . HYPOKALEMIA 05/04/2008  . MORBID OBESITY 05/04/2008  . ANXIETY DEPRESSION 05/04/2008  . GERD 05/04/2008  . CONSTIPATION 05/04/2008  . HEMATEMESIS 05/04/2008  . HYPERTENSION, HX OF 05/04/2008    Ajeenah Heiny PTA 05/21/2016, 6:31 PM  [facility] [address] [place], [place], [address] Phone: [phone]   Fax:  [phone]  Name: [name] MRN: [id number] Date of Birth: 10-Apr-1950

## 2016-05-21 NOTE — Patient Instructions (Signed)
Elbow and hand exercises added from exercise drawer.  All issued 5-10 x each painfree as able daily

## 2016-05-26 NOTE — Progress Notes (Signed)
Urine drug screen for this encounter is consistent for prescribed medication 

## 2016-05-27 ENCOUNTER — Ambulatory Visit: Payer: Medicare Other | Admitting: Physical Therapy

## 2016-05-31 ENCOUNTER — Emergency Department (HOSPITAL_COMMUNITY): Payer: Medicare Other

## 2016-05-31 ENCOUNTER — Emergency Department (HOSPITAL_COMMUNITY)
Admission: EM | Admit: 2016-05-31 | Discharge: 2016-05-31 | Disposition: A | Discharge status: Home / Self Care | Payer: Medicare Other | Attending: Emergency Medicine | Admitting: Emergency Medicine

## 2016-05-31 ENCOUNTER — Encounter (HOSPITAL_COMMUNITY): Payer: Self-pay

## 2016-05-31 DIAGNOSIS — R109 Unspecified abdominal pain: Secondary | ICD-10-CM | POA: Diagnosis not present

## 2016-05-31 DIAGNOSIS — K5900 Constipation, unspecified: Secondary | ICD-10-CM

## 2016-05-31 DIAGNOSIS — Z79899 Other long term (current) drug therapy: Secondary | ICD-10-CM | POA: Insufficient documentation

## 2016-05-31 DIAGNOSIS — R911 Solitary pulmonary nodule: Secondary | ICD-10-CM | POA: Diagnosis not present

## 2016-05-31 DIAGNOSIS — I10 Essential (primary) hypertension: Secondary | ICD-10-CM | POA: Diagnosis not present

## 2016-05-31 DIAGNOSIS — D3502 Benign neoplasm of left adrenal gland: Secondary | ICD-10-CM | POA: Diagnosis not present

## 2016-05-31 DIAGNOSIS — R1013 Epigastric pain: Secondary | ICD-10-CM | POA: Diagnosis not present

## 2016-05-31 DIAGNOSIS — R0602 Shortness of breath: Secondary | ICD-10-CM | POA: Diagnosis not present

## 2016-05-31 DIAGNOSIS — E119 Type 2 diabetes mellitus without complications: Secondary | ICD-10-CM | POA: Insufficient documentation

## 2016-05-31 LAB — URINALYSIS, ROUTINE W REFLEX MICROSCOPIC
Bacteria, UA: NONE SEEN
Bilirubin Urine: NEGATIVE
Glucose, UA: >=500 mg/dL — AB
Hgb urine dipstick: NEGATIVE
Ketones, ur: NEGATIVE mg/dL
Leukocytes, UA: NEGATIVE
Nitrite: NEGATIVE
Protein, ur: NEGATIVE mg/dL
Specific Gravity, Urine: 1.025 (ref 1.005–1.030)
pH: 6 (ref 5.0–8.0)

## 2016-05-31 LAB — COMPREHENSIVE METABOLIC PANEL
ALT: 19 U/L (ref 14–54)
AST: 22 U/L (ref 15–41)
Albumin: 4.5 g/dL (ref 3.5–5.0)
Alkaline Phosphatase: 53 U/L (ref 38–126)
Anion gap: 9 (ref 5–15)
BUN: 17 mg/dL (ref 6–20)
CO2: 29 mmol/L (ref 22–32)
Calcium: 9.6 mg/dL (ref 8.9–10.3)
Chloride: 101 mmol/L (ref 101–111)
Creatinine, Ser: 0.89 mg/dL (ref 0.44–1.00)
GFR calc Af Amer: >60 mL/min (ref >60)
GFR calc non Af Amer: >60 mL/min (ref >60)
Glucose, Bld: 197 mg/dL — ABNORMAL HIGH (ref 65–99)
Potassium: 3.7 mmol/L (ref 3.5–5.1)
Sodium: 139 mmol/L (ref 135–145)
Total Bilirubin: 0.9 mg/dL (ref 0.3–1.2)
Total Protein: 7.4 g/dL (ref 6.5–8.1)

## 2016-05-31 LAB — CBC
HCT: 40.4 % (ref 36.0–46.0)
Hemoglobin: 14.1 g/dL (ref 12.0–15.0)
MCH: 32.7 pg (ref 26.0–34.0)
MCHC: 34.9 g/dL (ref 30.0–36.0)
MCV: 93.7 fL (ref 78.0–100.0)
Platelets: 245 10*3/uL (ref 150–400)
RBC: 4.31 MIL/uL (ref 3.87–5.11)
RDW: 11.9 % (ref 11.5–15.5)
WBC: 4.8 10*3/uL (ref 4.0–10.5)

## 2016-05-31 LAB — LIPASE, BLOOD: Lipase: 26 U/L (ref 11–51)

## 2016-05-31 MED ORDER — IOPAMIDOL (ISOVUE-300) INJECTION 61%
INTRAVENOUS | Status: AC
  Administered 2016-05-31: 100 mL
  Filled 2016-05-31: qty 100

## 2016-05-31 MED ORDER — POLYETHYLENE GLYCOL 3350 17 GM/SCOOP PO POWD: 17.0000 g | Freq: Every day | ORAL | 0 refills | Status: DC

## 2016-05-31 NOTE — ED Provider Notes (Signed)
[facility] DEPT Provider Note   CSN: [id number] Arrival date & time: 05/31/16  1553     History   Chief Complaint Chief Complaint  Patient presents with  . Abdominal Pain  . Shortness of Breath    HPI EVADNA EMANUEL is a 66 y.o. female.  HPI Patient reports increasing abdominal distention and abdominal pain over the past 2 weeks.  She feels like she has not had a significant bowel movement in the past week.  Her last colonoscopy was approximately 9 years ago.  No melena or hematochezia.  No diarrhea.  She states that she has foul-smelling gas.  A time my evaluation she reports no shortness of breath.  No cough or congestion.  No fevers or chills.  No urinary complaints.   Past Medical History:  Diagnosis Date  . Anxiety   . Diabetes mellitus without complication ([place])   . Hyperlipidemia   . Hypertension   . PTSD (post-traumatic stress disorder)     Patient Active Problem List   Diagnosis Date Noted  . Left shoulder pain 05/12/2016  . Cervical spondylosis without myelopathy 05/12/2016  . Rotator cuff syndrome, left 05/12/2016  . Restless leg syndrome 05/12/2016  . Primary osteoarthritis, left hand 05/12/2016  . MELENA 10/18/2008  . GASTROPARESIS 05/30/2008  . GLUCOSE INTOLERANCE 05/04/2008  . HYPERCHOLESTEROLEMIA 05/04/2008  . HYPOKALEMIA 05/04/2008  . MORBID OBESITY 05/04/2008  . ANXIETY DEPRESSION 05/04/2008  . GERD 05/04/2008  . CONSTIPATION 05/04/2008  . HEMATEMESIS 05/04/2008  . HYPERTENSION, HX OF 05/04/2008    Past Surgical History:  Procedure Laterality Date  . ABDOMINAL HYSTERECTOMY    . ANKLE SURGERY    . BACK SURGERY    . CHOLECYSTECTOMY    . NOSE SURGERY     x 2   . ROTATOR CUFF REPAIR      OB History    No data available       Home Medications    Prior to Admission medications   Medication Sig Start Date End Date Taking? Authorizing Provider  clonazePAM (KLONOPIN) 1 MG tablet Take 1 mg by mouth 2 (two) times daily as needed for  anxiety.    Yes Historical Provider, MD  dapagliflozin propanediol (FARXIGA) 10 MG TABS tablet Take 1 tablet by mouth daily.   Yes Historical Provider, MD  diclofenac sodium (VOLTAREN) 1 % GEL Apply topically to affected area qid 03/12/16  Yes Gerda Diss, DO  DULoxetine (CYMBALTA) 30 MG capsule Take 30 mg by mouth daily. 05/14/16  Yes Historical Provider, MD  fluticasone (FLONASE) 50 MCG/ACT nasal spray Place 1 spray into both nostrils 2 (two) times daily as needed for allergies.  06/08/14  Yes Historical Provider, MD  gabapentin (NEURONTIN) 400 MG capsule Take 800 mg by mouth at bedtime.  03/07/16  Yes Historical Provider, MD  hydrochlorothiazide (HYDRODIURIL) 25 MG tablet Take 1 tablet by mouth daily. 08/09/14  Yes Historical Provider, MD  HYDROcodone-acetaminophen (NORCO/VICODIN) 5-325 MG tablet Take 1 tablet by mouth every 6 (six) hours as needed for moderate pain. 05/19/16  Yes Meredith Staggers, MD  pantoprazole (PROTONIX) 40 MG tablet Take 1 tablet by mouth every morning. 06/08/14  Yes Historical Provider, MD  rosuvastatin (CRESTOR) 10 MG tablet Take 1 tablet by mouth daily.  08/07/15  Yes Historical Provider, MD  valACYclovir (VALTREX) 500 MG tablet Take 500 mg by mouth daily. 05/12/16  Yes Historical Provider, MD  naproxen (NAPROSYN) 500 MG tablet Take 1 tablet (500 mg total) by mouth 2 (two) times  daily. Patient not taking: Reported on 03/18/2016 10/20/15   Shawn C Joy, PA-C  phenazopyridine (PYRIDIUM) 200 MG tablet Take 1 tablet (200 mg total) by mouth 3 (three) times daily as needed for pain. Patient not taking: Reported on 03/18/2016 03/09/14   Charlesetta Shanks, MD  polyethylene glycol powder (MIRALAX) powder Take 17 g by mouth daily. 05/31/16   Jola Schmidt, MD    Family History No family history on file.  Social History Social History  Substance Use Topics  . Smoking status: Never Smoker  . Smokeless tobacco: Never Used  . Alcohol use No     Allergies   Penicillins   Review of  Systems Review of Systems  All other systems reviewed and are negative.    Physical Exam Updated Vital Signs BP 118/62   Pulse 60   Temp 98.1 F (36.7 C) (Oral)   Resp 18   Ht 5\' 8"  (1.727 m)   Wt 180 lb (81.6 kg)   SpO2 99%   BMI 27.37 kg/m   Physical Exam  Constitutional: She is oriented to person, place, and time. She appears well-developed and well-nourished. No distress.  HENT:  Head: Normocephalic and atraumatic.  Eyes: EOM are normal.  Neck: Normal range of motion.  Cardiovascular: Normal rate, regular rhythm and normal heart sounds.   Pulmonary/Chest: Effort normal and breath sounds normal.  Abdominal: Soft. She exhibits no distension. There is no tenderness.  Genitourinary:  Genitourinary Comments: Rectal examination demonstrates no significant stool in the rectal vault.  No rectal masses noted  Musculoskeletal: Normal range of motion.  Neurological: She is alert and oriented to person, place, and time.  Skin: Skin is warm and dry.  Psychiatric: She has a normal mood and affect. Judgment normal.  Nursing note and vitals reviewed.    ED Treatments / Results  Labs (all labs ordered are listed, but only abnormal results are displayed) Labs Reviewed  COMPREHENSIVE METABOLIC PANEL - Abnormal; Notable for the following:       Result Value   Glucose, Bld 197 (*)    All other components within normal limits  URINALYSIS, ROUTINE W REFLEX MICROSCOPIC - Abnormal; Notable for the following:    Glucose, UA >=500 (*)    Squamous Epithelial / LPF 0-5 (*)    All other components within normal limits  LIPASE, BLOOD  CBC    EKG  EKG Interpretation None       Radiology Dg Chest 2 View  Result Date: 05/31/2016 CLINICAL DATA:  Shortness of breath and epigastric pain for 2 days. EXAM: CHEST  2 VIEW COMPARISON:  03/18/2016 FINDINGS: The heart size and mediastinal contours are within normal limits. Aortic atherosclerosis. Both lungs are clear. The visualized skeletal  structures are unremarkable. IMPRESSION: No active cardiopulmonary disease. Electronically Signed   By: Earle Gell M.D.   On: 05/31/2016 17:02   Ct Abdomen Pelvis W Contrast  Result Date: 05/31/2016 CLINICAL DATA:  Abdominal pain beginning last night and constipation. Some shortness-of-breath and epigastric pain. EXAM: CT ABDOMEN AND PELVIS WITH CONTRAST TECHNIQUE: Multidetector CT imaging of the abdomen and pelvis was performed using the standard protocol following bolus administration of intravenous contrast. CONTRAST:  162mL ISOVUE-300 IOPAMIDOL (ISOVUE-300) INJECTION 61% COMPARISON:  09/18/2008 and left hip CT 10/20/2015 FINDINGS: Lower chest: 3-4 mm nodule over the right middle lobe. Minimal linear scarring over the left lower lobe. Calcified plaque over the left anterior descending coronary artery. Hepatobiliary: Previous cholecystectomy. Liver and biliary tree are normal. Pancreas: Within normal.  Spleen: Within normal. Adrenals/Urinary Tract: Right adrenal gland is within normal. Stable small calcified left adrenal mass. Kidneys are normal in size without hydronephrosis or nephrolithiasis. Ureters and bladder are within normal. Stomach/Bowel: Stomach and small bowel are within normal. Appendix is not well seen. Colon is within normal. Vascular/Lymphatic: Mild calcified plaque over the abdominal aorta and iliac arteries. Remaining vascular structures are unremarkable. No adenopathy. Reproductive: Previous hysterectomy.  Adnexa are within normal. Other: No significant free fluid or focal inflammatory change. Atrophy of the right rectus abdominus muscle findings suggesting prior right abdominal wall hernia repair. Musculoskeletal: Degenerate changes of the spine and hips. IMPRESSION: No acute findings in the abdomen/pelvis. Stable small calcified left adrenal mass unchanged since 2010. Aortic atherosclerosis. Three-4 mm nodule over the right middle lobe. Recommend follow-up noncontrast chest CT 1 year. This  recommendation follows the consensus statement: Guidelines for Management of Small Pulmonary Nodules Detected on CT Scans: A Statement from the [place] as published in Radiology 2005; 237:395-400. Online at: https://www.arnold.com/. Postsurgical changes as described. Mild atherosclerotic coronary artery disease. Electronically Signed   By: Marin Olp M.D.   On: 05/31/2016 18:48    Procedures Procedures (including critical care time)  Medications Ordered in ED Medications  iopamidol (ISOVUE-300) 61 % injection (100 mLs  Contrast Given 05/31/16 1757)     Initial Impression / Assessment and Plan / ED Course  I have reviewed the triage vital signs and the nursing notes.  Pertinent labs & imaging results that were available during my care of the patient were reviewed by me and considered in my medical decision making (see chart for details).     Patient is overall well-appearing.  She'll be discharged home on daily MiraLAX.  No hypoxia.  Initial O2 sat 81% is an error.  She has no hypoxia here in the emergency department.  Home with daily MiraLAX.  Primary care follow-up.  She will need colonoscopy.  She'll follow up with [facility].     Final Clinical Impressions(s) / ED Diagnoses   Final diagnoses:  Abdominal pain, unspecified abdominal location  Constipation, unspecified constipation type  Pulmonary nodule    New Prescriptions New Prescriptions   POLYETHYLENE GLYCOL POWDER (MIRALAX) POWDER    Take 17 g by mouth daily.     Jola Schmidt, MD 05/31/16 2136

## 2016-05-31 NOTE — Discharge Instructions (Signed)
You will need a repeat CT scan of your chest in 6 months to evaluate the pulmonary nodule

## 2016-05-31 NOTE — ED Triage Notes (Signed)
Patient c/o abdominal pain that began last night.  Patient states that she is constipated and has a hx of the same.  Patient states that she has not had a "regular BM" x4 weeks.  Denies blood in emesis or stool, denies dysuria.  Patient states that she is having some SOB and upper epigastric pain x2 days.  Patient also states that has had pain in her right leg x2 days as well.

## 2016-05-31 NOTE — ED Notes (Signed)
Patient tearful and states she has PTSD. Patient took Klonopin 1 mg and EDP aware.

## 2016-06-02 ENCOUNTER — Ambulatory Visit: Payer: Medicare Other | Admitting: Physical Therapy

## 2016-06-03 ENCOUNTER — Telehealth: Payer: Self-pay | Admitting: *Deleted

## 2016-06-03 NOTE — Telephone Encounter (Signed)
[name] called for a refill on her hydrocodone. I called and told her she just got an Rx on 05/19/16 and that is a 30 day supply and has to last until her next appt 06/16/16 with Dr Naaman Plummer.

## 2016-06-03 NOTE — Progress Notes (Signed)
Addendum to Urine Drug Screen report:  UDS is inconsistent due to having unprescribed benzodiazepine Lorazepam in her urine.  According to [facility] Ms Younker has not been prescribed Lorazepam.  She has been prescribed Clonazepam which is present in UDS but the second benzo is unprescribed and unreported.

## 2016-06-04 ENCOUNTER — Ambulatory Visit: Payer: Medicare Other | Admitting: Physical Therapy

## 2016-06-04 DIAGNOSIS — M25512 Pain in left shoulder: Secondary | ICD-10-CM | POA: Diagnosis not present

## 2016-06-04 DIAGNOSIS — M542 Cervicalgia: Secondary | ICD-10-CM | POA: Diagnosis not present

## 2016-06-04 DIAGNOSIS — M6281 Muscle weakness (generalized): Secondary | ICD-10-CM | POA: Diagnosis not present

## 2016-06-04 DIAGNOSIS — M62838 Other muscle spasm: Secondary | ICD-10-CM

## 2016-06-04 DIAGNOSIS — M25532 Pain in left wrist: Secondary | ICD-10-CM

## 2016-06-04 DIAGNOSIS — G8929 Other chronic pain: Secondary | ICD-10-CM

## 2016-06-04 NOTE — Patient Instructions (Signed)
Strengthening: Resisted Internal Rotation   Hold tubing in left hand, elbow at side and forearm out. Rotate forearm in across body. Repeat __10__ times per set. Do ___1_ sets per session. Do _1___ sessions per day. Use towel roll  http://orth.exer.us/830   Copyright  VHI. All rights reserved.  Strengthening: Resisted External Rotation   Hold tubing in right hand, elbow at side and forearm across body. Rotate forearm out. Repeat _10___ times per set. Do __1__ sets per session. Do ____ 1sessions per day. Yellow band,  Towel roll  http://orth.exer.us/828   Copyright  VHI. All rights reserved.  Strengthening: Resisted Flexion  Hold tubing with back to wall,  Press arm froward, straightening elbow.  10 X,  Red band 1 X a day http://orth.exer.us/824   Copyright  VHI. All rights reserved.  Strengthening: Resisted Extension   Hold tubing in right hand, arm forward. Pull arm back, elbow bent. Repeat _10___ times per set. Do __1__ sets per session. Do ____1 sessions per day. Red band.  Keep ROM [pain free  http://orth.exer.us/832   Copyright  VHI. All rights reserved.

## 2016-06-04 NOTE — Therapy (Addendum)
[place], [place], [address] Phone: [phone]   Fax:  [phone]  Physical Therapy Treatment and Discharge   Patient Details  Name: [name] MRN: [id number] Date of Birth: 1951/03/13 Referring Provider: Dr. Alger Simons  Encounter Date: 06/04/2016      PT End of Session - 06/04/16 1758    Visit Number 4   Number of Visits 16   Date for PT Re-Evaluation 07/10/16   PT Start Time 9935   PT Stop Time 1433   PT Time Calculation (min) 60 min   Activity Tolerance Patient tolerated treatment well   Behavior During Therapy [facility] for tasks assessed/performed      Past Medical History:  Diagnosis Date  . Anxiety   . Diabetes mellitus without complication ([place])   . Hyperlipidemia   . Hypertension   . PTSD (post-traumatic stress disorder)     Past Surgical History:  Procedure Laterality Date  . ABDOMINAL HYSTERECTOMY    . ANKLE SURGERY    . BACK SURGERY    . CHOLECYSTECTOMY    . NOSE SURGERY     x 2   . ROTATOR CUFF REPAIR      There were no vitals filed for this visit.      Subjective Assessment - 06/04/16 1337    Subjective Missed a few sessions due to the flu.  Able to use her hand and able to hook her bra now.  ,  I have been doing my exercises.   I have researched and I want to get a TENS unit.   Currently in Pain? Yes   Pain Score 4    Pain Location Shoulder   Pain Orientation Left;Anterior  superior   Pain Descriptors / Indicators Aching   Pain Type Chronic pain   Pain Frequency Intermittent   Aggravating Factors  reaching behind   Pain Relieving Factors Exercises,,  IFC            [facility] PT Assessment - 06/04/16 0001      AROM   Left Shoulder Flexion 145 Degrees   Left Shoulder ABduction --  WNL  able to touch ear   Cervical Flexion 49   Cervical Extension 48   Cervical - Right Rotation 60   Cervical - Left Rotation 70                     [facility]  Adult PT Treatment/Exercise - 06/04/16 0001      Moist Heat Therapy   Number Minutes Moist Heat 20 Minutes   Moist Heat Location Shoulder     Electrical Stimulation   Electrical Stimulation Location shoulder   Electrical Stimulation Action IFC   Electrical Stimulation Parameters 12   Electrical Stimulation Goals Pain                PT Education - 06/04/16 1758    Education provided Yes   Education Details HEP   Person(s) Educated Patient   Methods Explanation;Demonstration;Tactile cues;Verbal cues;Handout   Comprehension Verbalized understanding;Returned demonstration          PT Short Term Goals - 06/04/16 1759      PT SHORT TERM GOAL #1   Title Pt will be I with HEP for neck and shoulder, hand   Baseline independent   Time 4   Period Weeks   Status Achieved     PT SHORT TERM GOAL #2   Title Pt will be able to  demo cervical AROM with 10 deg improvement in all planes with pain no more than moderate.    Baseline 48 extension, 60 degrees rotation to right, to left 70, 49 degrees neck flexion  AROM,,, side bend not measured.   Time 4   Period Weeks   Status Partially Met     PT SHORT TERM GOAL #3   Title Pt will be able to reach Lt UE to 120 deg flexion for more effective home tasks, reaching and ADLs   Baseline 145   Time 4   Period Weeks   Status Achieved     PT SHORT TERM GOAL #4   Title Pt will be able to grip with L hand light items in the home for crafting, eating, etc and pain minimal.    Baseline uses hand a little more.   Time 4   Period Weeks   Status On-going           PT Long Term Goals - 05/15/16 1557      PT LONG TERM GOAL #1   Title Pt will be I with concepts of posture, body mechanics, and RICE for prevention of further disability.    Time 8   Period Weeks   Status New     PT LONG TERM GOAL #2   Title Pt will be able to lift, carry mod sized items in her home with min increase in pain from baseline.    Time 8   Period Weeks    Status New     PT LONG TERM GOAL #3   Title Pt will demo 4+/5 or more in L UE with min pain for improved function, lifting, painting.    Time 8   Period Weeks   Status New     PT LONG TERM GOAL #4   Title Pt will be able to sleep with less waking episodes most days of the week.    Time 8   Period Weeks   Status New     PT LONG TERM GOAL #5   Title Pt will demo normal AROM in cervical spine with stretch, no pain increase.    Time 8   Period Weeks   Status New               Plan - 06/04/16 1802    Clinical Impression Statement STG# 1,#3 met.  STG#2 partially met 145 AROM left shoulder flexion.  Patient has misses a few session,  she has been sick with the flu.    PT Next Visit Plan Review [place].  work toward other goals,  measure cervical side bends. , grip   PT Home Exercise Plan Cervical ROM,  Elbow, wrisr hand AROM,  [place]   Consulted and Agree with Plan of Care Patient      Patient will benefit from skilled therapeutic intervention in order to improve the following deficits and impairments:  Increased fascial restricitons, Pain, Decreased activity tolerance, Impaired flexibility, Postural dysfunction, Improper body mechanics, Impaired sensation, Decreased strength, Decreased mobility, Increased muscle spasms, Impaired UE functional use, Decreased range of motion  Visit Diagnosis: Cervicalgia  Muscle weakness (generalized)  Chronic left shoulder pain  Pain in left wrist  Other muscle spasm     Problem List Patient Active Problem List   Diagnosis Date Noted  . Left shoulder pain 05/12/2016  . Cervical spondylosis without myelopathy 05/12/2016  . Rotator cuff syndrome, left 05/12/2016  . Restless leg syndrome 05/12/2016  . Primary osteoarthritis, left hand  05/12/2016  . MELENA 10/18/2008  . GASTROPARESIS 05/30/2008  . GLUCOSE INTOLERANCE 05/04/2008  . HYPERCHOLESTEROLEMIA 05/04/2008  . HYPOKALEMIA 05/04/2008  . MORBID OBESITY 05/04/2008  .  ANXIETY DEPRESSION 05/04/2008  . GERD 05/04/2008  . CONSTIPATION 05/04/2008  . HEMATEMESIS 05/04/2008  . HYPERTENSION, HX OF 05/04/2008    Juliene Kirsh PTA 06/04/2016, 6:05 PM  [facility] [address] [place], [place], [address] Phone: [phone]   Fax:  [phone]  Name: [name] MRN: [id number] Date of Birth: 12/02/1950  PHYSICAL THERAPY DISCHARGE SUMMARY  Visits from Start of Care: 3  Current functional level related to goals / functional outcomes: Unknown, did not return to complete PT    Unknown, see above for most current info    Education / Equipment: Posture, HEP  Plan: Patient agrees to discharge.  Patient goals were not met. Patient is being discharged due to not returning since the last visit.  ?????    Raeford Razor, PT 07/02/16 9:18 AM Phone: [phone] Fax: [phone]

## 2016-06-16 ENCOUNTER — Encounter: Payer: Medicare Other | Admitting: Physical Medicine & Rehabilitation

## 2016-06-18 ENCOUNTER — Telehealth: Payer: Self-pay | Admitting: Registered Nurse

## 2016-06-18 ENCOUNTER — Encounter: Payer: Self-pay | Admitting: Registered Nurse

## 2016-06-18 ENCOUNTER — Encounter: Payer: Medicare Other | Attending: Physical Medicine & Rehabilitation | Admitting: Registered Nurse

## 2016-06-18 VITALS — BP 101/71 | HR 96 | Resp 14

## 2016-06-18 DIAGNOSIS — M75102 Unspecified rotator cuff tear or rupture of left shoulder, not specified as traumatic: Secondary | ICD-10-CM | POA: Diagnosis not present

## 2016-06-18 DIAGNOSIS — M545 Low back pain: Secondary | ICD-10-CM | POA: Diagnosis not present

## 2016-06-18 DIAGNOSIS — F329 Major depressive disorder, single episode, unspecified: Secondary | ICD-10-CM | POA: Insufficient documentation

## 2016-06-18 DIAGNOSIS — R102 Pelvic and perineal pain: Secondary | ICD-10-CM | POA: Insufficient documentation

## 2016-06-18 DIAGNOSIS — F331 Major depressive disorder, recurrent, moderate: Secondary | ICD-10-CM | POA: Diagnosis not present

## 2016-06-18 DIAGNOSIS — M542 Cervicalgia: Secondary | ICD-10-CM | POA: Insufficient documentation

## 2016-06-18 DIAGNOSIS — M47812 Spondylosis without myelopathy or radiculopathy, cervical region: Secondary | ICD-10-CM

## 2016-06-18 DIAGNOSIS — G894 Chronic pain syndrome: Secondary | ICD-10-CM

## 2016-06-18 DIAGNOSIS — T1490XA Injury, unspecified, initial encounter: Secondary | ICD-10-CM | POA: Insufficient documentation

## 2016-06-18 DIAGNOSIS — E119 Type 2 diabetes mellitus without complications: Secondary | ICD-10-CM | POA: Insufficient documentation

## 2016-06-18 DIAGNOSIS — E785 Hyperlipidemia, unspecified: Secondary | ICD-10-CM | POA: Insufficient documentation

## 2016-06-18 DIAGNOSIS — M25512 Pain in left shoulder: Secondary | ICD-10-CM | POA: Diagnosis not present

## 2016-06-18 DIAGNOSIS — Z79899 Other long term (current) drug therapy: Secondary | ICD-10-CM

## 2016-06-18 DIAGNOSIS — I10 Essential (primary) hypertension: Secondary | ICD-10-CM | POA: Insufficient documentation

## 2016-06-18 DIAGNOSIS — G8929 Other chronic pain: Secondary | ICD-10-CM | POA: Diagnosis not present

## 2016-06-18 DIAGNOSIS — M5030 Other cervical disc degeneration, unspecified cervical region: Secondary | ICD-10-CM | POA: Insufficient documentation

## 2016-06-18 DIAGNOSIS — G2581 Restless legs syndrome: Secondary | ICD-10-CM | POA: Diagnosis not present

## 2016-06-18 DIAGNOSIS — M25552 Pain in left hip: Secondary | ICD-10-CM | POA: Diagnosis not present

## 2016-06-18 DIAGNOSIS — F4310 Post-traumatic stress disorder, unspecified: Secondary | ICD-10-CM | POA: Diagnosis not present

## 2016-06-18 DIAGNOSIS — Z5181 Encounter for therapeutic drug level monitoring: Secondary | ICD-10-CM | POA: Diagnosis not present

## 2016-06-18 MED ORDER — HYDROCODONE-ACETAMINOPHEN 5-325 MG PO TABS: 1.0000 | ORAL_TABLET | Freq: Four times a day (QID) | ORAL | 0 refills | Status: DC | PRN

## 2016-06-18 NOTE — Telephone Encounter (Signed)
On 06/18/2016 the [place] was reviewed no conflict was seen on the [place] with multiple prescribers. Ms. Bashore has a signed narcotic contract with our office. If there were any discrepancies this would have been reported to her physician.
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
## 2016-06-27 DIAGNOSIS — K5903 Drug induced constipation: Secondary | ICD-10-CM | POA: Diagnosis not present

## 2016-06-27 DIAGNOSIS — R911 Solitary pulmonary nodule: Secondary | ICD-10-CM | POA: Diagnosis not present

## 2016-06-27 DIAGNOSIS — F4310 Post-traumatic stress disorder, unspecified: Secondary | ICD-10-CM | POA: Diagnosis not present

## 2016-06-27 DIAGNOSIS — E119 Type 2 diabetes mellitus without complications: Secondary | ICD-10-CM | POA: Diagnosis not present

## 2016-07-02 ENCOUNTER — Encounter: Payer: Self-pay | Admitting: Physical Therapy

## 2016-07-02 ENCOUNTER — Telehealth: Payer: Self-pay | Admitting: Physical Therapy

## 2016-07-02 NOTE — Telephone Encounter (Signed)
I called patient regarding her lapse in PT.  Unable to leave a voicemail. It has been 30 days and so I will discharge her from PT.  Last visit 06/04/16.

## 2016-07-13 ENCOUNTER — Emergency Department (HOSPITAL_COMMUNITY)
Admission: EM | Admit: 2016-07-13 | Discharge: 2016-07-13 | Disposition: A | Discharge status: Home / Self Care | Payer: Medicare Other | Attending: Emergency Medicine | Admitting: Emergency Medicine

## 2016-07-13 DIAGNOSIS — R319 Hematuria, unspecified: Secondary | ICD-10-CM | POA: Diagnosis not present

## 2016-07-13 DIAGNOSIS — Z5321 Procedure and treatment not carried out due to patient leaving prior to being seen by health care provider: Secondary | ICD-10-CM | POA: Diagnosis not present

## 2016-07-13 DIAGNOSIS — R109 Unspecified abdominal pain: Secondary | ICD-10-CM | POA: Diagnosis not present

## 2016-07-13 DIAGNOSIS — R300 Dysuria: Secondary | ICD-10-CM | POA: Diagnosis not present

## 2016-07-13 LAB — URINALYSIS, ROUTINE W REFLEX MICROSCOPIC
Bilirubin Urine: NEGATIVE
Glucose, UA: >=500 mg/dL — AB
Ketones, ur: NEGATIVE mg/dL
Nitrite: NEGATIVE
Protein, ur: 100 mg/dL — AB
Specific Gravity, Urine: 1.036 — ABNORMAL HIGH (ref 1.005–1.030)
Squamous Epithelial / LPF: NONE SEEN
pH: 6 (ref 5.0–8.0)

## 2016-07-13 NOTE — ED Triage Notes (Signed)
Pt c/o left flank pain, need to urinate all night last night but was unable to more than "dribble," today urine with blood, blood clots, dysuria. CVAT. No nausea, emesis.

## 2016-07-14 ENCOUNTER — Encounter: Payer: Medicare Other | Attending: Physical Medicine & Rehabilitation | Admitting: Registered Nurse

## 2016-07-14 ENCOUNTER — Encounter: Payer: Self-pay | Admitting: Registered Nurse

## 2016-07-14 ENCOUNTER — Ambulatory Visit
Admission: RE | Admit: 2016-07-14 | Discharge: 2016-07-14 | Disposition: A | Discharge status: Home / Self Care | Payer: Medicare Other | Source: Ambulatory Visit | Attending: Registered Nurse | Admitting: Registered Nurse

## 2016-07-14 VITALS — BP 120/79 | HR 71 | Resp 14

## 2016-07-14 DIAGNOSIS — M25512 Pain in left shoulder: Secondary | ICD-10-CM | POA: Diagnosis not present

## 2016-07-14 DIAGNOSIS — T1490XA Injury, unspecified, initial encounter: Secondary | ICD-10-CM | POA: Diagnosis not present

## 2016-07-14 DIAGNOSIS — F4310 Post-traumatic stress disorder, unspecified: Secondary | ICD-10-CM | POA: Insufficient documentation

## 2016-07-14 DIAGNOSIS — G894 Chronic pain syndrome: Secondary | ICD-10-CM

## 2016-07-14 DIAGNOSIS — M47812 Spondylosis without myelopathy or radiculopathy, cervical region: Secondary | ICD-10-CM | POA: Diagnosis not present

## 2016-07-14 DIAGNOSIS — M542 Cervicalgia: Secondary | ICD-10-CM | POA: Insufficient documentation

## 2016-07-14 DIAGNOSIS — M75102 Unspecified rotator cuff tear or rupture of left shoulder, not specified as traumatic: Secondary | ICD-10-CM

## 2016-07-14 DIAGNOSIS — F329 Major depressive disorder, single episode, unspecified: Secondary | ICD-10-CM | POA: Insufficient documentation

## 2016-07-14 DIAGNOSIS — E119 Type 2 diabetes mellitus without complications: Secondary | ICD-10-CM | POA: Insufficient documentation

## 2016-07-14 DIAGNOSIS — G2581 Restless legs syndrome: Secondary | ICD-10-CM | POA: Diagnosis not present

## 2016-07-14 DIAGNOSIS — M79622 Pain in left upper arm: Secondary | ICD-10-CM | POA: Diagnosis not present

## 2016-07-14 DIAGNOSIS — M545 Low back pain: Secondary | ICD-10-CM | POA: Insufficient documentation

## 2016-07-14 DIAGNOSIS — Z5181 Encounter for therapeutic drug level monitoring: Secondary | ICD-10-CM

## 2016-07-14 DIAGNOSIS — G8929 Other chronic pain: Secondary | ICD-10-CM | POA: Insufficient documentation

## 2016-07-14 DIAGNOSIS — E785 Hyperlipidemia, unspecified: Secondary | ICD-10-CM | POA: Insufficient documentation

## 2016-07-14 DIAGNOSIS — R102 Pelvic and perineal pain: Secondary | ICD-10-CM | POA: Diagnosis not present

## 2016-07-14 DIAGNOSIS — I10 Essential (primary) hypertension: Secondary | ICD-10-CM | POA: Diagnosis not present

## 2016-07-14 DIAGNOSIS — M5030 Other cervical disc degeneration, unspecified cervical region: Secondary | ICD-10-CM | POA: Diagnosis not present

## 2016-07-14 DIAGNOSIS — Z79899 Other long term (current) drug therapy: Secondary | ICD-10-CM

## 2016-07-14 MED ORDER — HYDROCODONE-ACETAMINOPHEN 5-325 MG PO TABS: 1.0000 | ORAL_TABLET | Freq: Three times a day (TID) | ORAL | 0 refills | Status: DC | PRN
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
## 2016-07-14 NOTE — Patient Instructions (Signed)
When you are having a good day, ( decreased amount of pain ) Only take the Hydrocodone twice a day. On the bad days you may take the Hydrocodone Three times a day.  Remember you can only take the Hydrocodone three times a day 20 days out of the month.

## 2016-07-15 ENCOUNTER — Telehealth: Payer: Self-pay | Admitting: Registered Nurse

## 2016-07-15 NOTE — Telephone Encounter (Signed)
Placed a call to [name], no answer and voicemail full, unable to leave message regarding her X-ray results.

## 2016-07-31 DIAGNOSIS — F331 Major depressive disorder, recurrent, moderate: Secondary | ICD-10-CM | POA: Diagnosis not present

## 2016-08-11 ENCOUNTER — Ambulatory Visit (INDEPENDENT_AMBULATORY_CARE_PROVIDER_SITE_OTHER): Payer: Medicare Other | Admitting: Family Medicine

## 2016-08-11 ENCOUNTER — Encounter: Payer: Self-pay | Admitting: Family Medicine

## 2016-08-11 ENCOUNTER — Encounter: Payer: Self-pay | Admitting: Gastroenterology

## 2016-08-11 VITALS — BP 118/70 | HR 87 | Resp 12 | Ht 68.0 in | Wt 189.2 lb

## 2016-08-11 DIAGNOSIS — F341 Dysthymic disorder: Secondary | ICD-10-CM | POA: Diagnosis not present

## 2016-08-11 DIAGNOSIS — Z1211 Encounter for screening for malignant neoplasm of colon: Secondary | ICD-10-CM | POA: Diagnosis not present

## 2016-08-11 DIAGNOSIS — M25512 Pain in left shoulder: Secondary | ICD-10-CM

## 2016-08-11 DIAGNOSIS — K219 Gastro-esophageal reflux disease without esophagitis: Secondary | ICD-10-CM

## 2016-08-11 DIAGNOSIS — E118 Type 2 diabetes mellitus with unspecified complications: Secondary | ICD-10-CM | POA: Diagnosis not present

## 2016-08-11 DIAGNOSIS — G8929 Other chronic pain: Secondary | ICD-10-CM | POA: Diagnosis not present

## 2016-08-11 DIAGNOSIS — K5900 Constipation, unspecified: Secondary | ICD-10-CM | POA: Diagnosis not present

## 2016-08-11 MED ORDER — GLUCOSE BLOOD VI STRP: ORAL_STRIP | 2 refills | Status: DC

## 2016-08-11 MED ORDER — ACCU-CHEK SOFTCLIX LANCET DEV MISC: 2 refills | Status: DC

## 2016-08-11 MED ORDER — ACCU-CHEK AVIVA DEVI: 0 refills | Status: DC

## 2016-08-11 MED ORDER — ONETOUCH LANCETS MISC: 2 refills | Status: DC

## 2016-08-11 MED ORDER — ESOMEPRAZOLE MAGNESIUM 40 MG PO CPDR: 40.0000 mg | DELAYED_RELEASE_CAPSULE | Freq: Two times a day (BID) | ORAL | 1 refills | Status: DC

## 2016-08-11 NOTE — Progress Notes (Signed)
HPI:   [name] is a 66 y.o. female, who is here today to establish care.  Former PCP: Ms Jeanette Shark, NP at [facility] [place] Last preventive routine visit: She sees her gyn annually.   Chronic medical problems: Depression,anxiety,chronic pain,DM II,HTN,HLD,and constipation among some.   Concerns today: Hydrocodone refill.  Hx of left shoulder pain, started about a year ago. She followed with ortho (08/2015), reporting having X ray and MRI, PT helped, then referred to pain management. She states that PT and TENS unit helped greatly but her health insurance will not cover neither one. Pain has ben worse for the past 4 weeks, limitation of ROM, and hand numbness. Pain is severe, constant, exacerbated by movement and alleviated by rest. Pain is radiated to left cervical and trapezium muscles. Limitation of cervical ROM.  She denies recent trauma.  She does not feel like pain management is helping much except for prescribing Hydrocodone-Acetaminophen,so she is considering not to keep next OV. She wonders if I can continue filling Rx.  Hx of anxiety and depression, she is on Klonopin 1 mg bid as needed. She also takes Cymbalta 30 mg 2 caps daily. She has been on Klonopin since her son's murder in 2016.  + Stress,worse recently because her mother's health issues.  She lives with fiance, who she mentions also has Hx of chronic pain, managed by his PCP with opioids.  Diabetes Mellitus II:   Dx 2014. Currently on Farxiga 10 mg daily. Last HgA1C 06/27/2016 was 6.9.  Checking BS's : Does not have a glucometer.  She is tolerating medications well. She denies nausea, vomiting, polydipsia, polyuria, or polyphagia. Denies foot numbness, tingling, or burning. + Left hand tingling and numbness,attributed to shoulder pain.    Lab Results  Component Value Date   CREATININE 0.89 05/31/2016   BUN 17 05/31/2016   NA 139 05/31/2016   K 3.7 05/31/2016   CL 101 05/31/2016    CO2 29 05/31/2016    HTN  Dx 39 years ago.  Last eye exam 02/2016.  Currently on HCTZ 25 mg daily.   She is taking medications as instructed, no side effects reported.  She has not noted unusual headache, visual changes, exertional chest pain, dyspnea,  focal weakness, or edema.   Lab Results  Component Value Date   CREATININE 0.89 05/31/2016   BUN 17 05/31/2016   NA 139 05/31/2016   K 3.7 05/31/2016   CL 101 05/31/2016   CO2 29 05/31/2016   GERD:  She is working on her diet. + Acid reflux and burping, exacerbated by certain foods and by bending forward and applying pressure to upper abdomen. Occasionally she feels like food stops in the middle of her chest,denies dysphagia. Protonix is not longer helping.   She states that she is also due for colonoscopy, last one in 2009. + Constipation, she did not tolerate Miralax, OTC Mg Citrate helps some. Still having hard stools,sometimes she has to manually move stool because straining is not enough. She is not reporting blood in stool or dyschezia. She has daily bowel movements.  Amitiza helped but her health insurance does not cover it, she was receiving samples from former PCP.   Review of Systems  Constitutional: Positive for fatigue. Negative for activity change, appetite change, fever and unexpected weight change.  HENT: Negative for mouth sores, nosebleeds and trouble swallowing.   Eyes: Negative for redness and visual disturbance.  Respiratory: Negative for cough, shortness of breath and  wheezing.   Cardiovascular: Negative for chest pain, palpitations and leg swelling.  Gastrointestinal: Negative for abdominal pain, nausea and vomiting.       Negative for changes in bowel habits.  Endocrine: Negative for cold intolerance, heat intolerance, polydipsia, polyphagia and polyuria.  Genitourinary: Negative for decreased urine volume and hematuria.  Musculoskeletal: Positive for arthralgias. Negative for gait problem and  joint swelling.  Skin: Negative for pallor and rash.  Neurological: Positive for numbness. Negative for syncope, weakness and headaches.  Psychiatric/Behavioral: Negative for confusion. The patient is nervous/anxious.       Current Outpatient Prescriptions on File Prior to Visit  Medication Sig Dispense Refill  . clonazePAM (KLONOPIN) 1 MG tablet Take 1 mg by mouth 2 (two) times daily as needed for anxiety.     . dapagliflozin propanediol (FARXIGA) 10 MG TABS tablet Take 1 tablet by mouth daily.    . diclofenac sodium (VOLTAREN) 1 % GEL Apply topically to affected area qid 100 g 2  . DULoxetine (CYMBALTA) 30 MG capsule Take 30 mg by mouth daily.    Marland Kitchen gabapentin (NEURONTIN) 400 MG capsule Take 800 mg by mouth at bedtime.     . hydrochlorothiazide (HYDRODIURIL) 25 MG tablet Take 1 tablet by mouth daily.    . rosuvastatin (CRESTOR) 10 MG tablet Take 1 tablet by mouth daily.     . valACYclovir (VALTREX) 500 MG tablet Take 500 mg by mouth daily.     No current facility-administered medications on file prior to visit.      Past Medical History:  Diagnosis Date  . Anxiety   . Diabetes mellitus without complication ([place])   . Hyperlipidemia   . Hypertension   . PTSD (post-traumatic stress disorder)    Allergies  Allergen Reactions  . Penicillins Rash    Has patient had a PCN reaction causing immediate rash, facial/tongue/throat swelling, SOB or lightheadedness with hypotension: Yes Has patient had a PCN reaction causing severe rash involving mucus membranes or skin necrosis: No Has patient had a PCN reaction that required hospitalization No Has patient had a PCN reaction occurring within the last 10 years: No If all of the above answers are "NO", then may proceed with Cephalosporin use.     No family history on file.  Social History   Social History  . Marital status: Divorced    Spouse name: N/A  . Number of children: N/A  . Years of education: N/A   Social History Main  Topics  . Smoking status: Never Smoker  . Smokeless tobacco: Never Used  . Alcohol use No  . Drug use: No  . Sexual activity: Not Asked   Other Topics Concern  . None   Social History Narrative  . None    Vitals:   08/11/16 1152  BP: 118/70  Pulse: 87  Resp: 12  O2 sat at RA 96%.  Body mass index is 28.78 kg/m.   Physical Exam  Nursing note and vitals reviewed. Constitutional: She is oriented to person, place, and time. She appears well-developed. No distress.  HENT:  Head: Atraumatic.  Mouth/Throat: Oropharynx is clear and moist and mucous membranes are normal.  Eyes: Conjunctivae and EOM are normal. Pupils are equal, round, and reactive to light.  Neck: No tracheal deviation present. No thyroid mass and no thyromegaly present.  Cardiovascular: Normal rate and regular rhythm.   No murmur heard. Pulses:      Dorsalis pedis pulses are 2+ on the right side, and 2+ on the  left side.  Respiratory: Effort normal and breath sounds normal. No respiratory distress.  GI: Soft. She exhibits no mass. There is no hepatomegaly. There is no tenderness.  Musculoskeletal: She exhibits no edema.       Left shoulder: She exhibits decreased range of motion and tenderness. She exhibits no swelling and no deformity.  Marked limitation of ROM left shoulder, not able to perform maneuvers. Pain upon palpation on anterior aspect of shoulder. Mild limitation of cervical ROM, pain elicited on left paraspinal muscles, also pain on same area upon palpation. Left trapezium pain and left interscapular muscles with palpation.  Lymphadenopathy:    She has no cervical adenopathy.  Neurological: She is alert and oriented to person, place, and time. She has normal strength. Coordination normal.  Skin: Skin is warm. No erythema.  Psychiatric: Her mood appears anxious. Her affect is blunt and labile. She expresses no suicidal ideation.  Well groomed, good eye contact.   Diabetic foot  exam:  Monofilament normal bilateral. Peripheral pulses present (DP). No calluses. + Bunions  No hypertrophic/long toenails.    ASSESSMENT AND PLAN:    [name] was seen today for establish care.  Diagnoses and all orders for this visit:  Gastroesophageal reflux disease, esophagitis presence not specified  Not well controlled. GERD precautions discussed. Stop Protonix and she agrees with trying Nexium 40 mg bid , which she can decrease to qd in 2-3 weeks.  -     esomeprazole (NEXIUM) 40 MG capsule; Take 1 capsule (40 mg total) by mouth 2 (two) times daily before a meal. -     Ambulatory referral to Gastroenterology  Chronic left shoulder pain  She would like another ortho evaluation to discussed options at this point. Continue following with pain clinic, I will not manage her chronic pain. She is upset about this because according to pt, her fiance gets his pain meds from his PCP. Recommend keeping appt.  -     Ambulatory referral to Orthopedic Surgery  Constipation, unspecified constipation type  Some side effects of opioid medications discussed. Increase fiber and fluid intake. She did not tolerate Miralax.  Continue OTC Mg Citrate, caution advice, some side effects discussed.  -     Ambulatory referral to Gastroenterology  Type 2 diabetes mellitus with complication, without long-term current use of insulin (HCC)  No changes in current management. She is due for HgA1C in 7-11/2016. Foot and dental care as well as eye exam discussed.  -     Lancet Devices (ACCU-CHEK SOFTCLIX) lancets; Use to test blood sugar once daily. -     Blood Glucose Monitoring Suppl (ACCU-CHEK AVIVA) device; Use as instructed -     glucose blood (ACCU-CHEK AVIVA) test strip; Use to test once daily.  Colon cancer screening -     Ambulatory referral to Gastroenterology  ANXIETY DEPRESSION  I agree with continuing treatment for depression and anxiety. Cymbalta changed to 60 mg cap. We  dicussed side effects of her current medications. Klonopin to continue as needed. Risk of interaction with opioid meds also addressed.  She does not need Rx at this time. F/U in 3 months.      Shayla Heming G. [place], MD  [facility]. [place] office.

## 2016-08-11 NOTE — Progress Notes (Signed)
Pre visit review using our clinic review tool, if applicable. No additional management support is needed unless otherwise documented below in the visit note. 

## 2016-08-11 NOTE — Patient Instructions (Signed)
A few things to remember from today's visit:   Gastroesophageal reflux disease, esophagitis presence not specified - Plan: esomeprazole (NEXIUM) 40 MG capsule  Chronic left shoulder pain - Plan: Ambulatory referral to Orthopedic Surgery    Avoid foods that make your symptoms worse, for example coffee, chocolate,pepermeint,alcohol, and greasy food. Raising the head of your bed about 6 inches may help with nocturnal symptoms.  Avoid tobacco use. Weight loss (if you are overweight). Avoid lying down for 3 hours after eating.  Instead 3 large meals daily try small and more frequent meals during the day.  Every medication have side effects and medications for GERD are not the exception.At this time I think benefit is greater than risk.  There has been some concerns about dementia and medications like Omeprazole or Nexium (PPI) but recent studies do not show a relation. Also kidney function can be affected among some patients that take these type of medications, we will follow accordingly. Taking these medications for long term could increase risk of osteoporosis (some debate now), vitamin deficiencies (Vit D and B12 specialty), increases risk of pneumonia.  You should be evaluated immediately if bloody vomiting, bloody stools, black stools (like tar), difficulty swallowing, food gets stuck on the way down or choking when eating. Abnormal weight loss or severe abdominal pain.  If symptoms are not resolved sometimes endoscopy is necessary.  Please be sure medication list is accurate. If a new problem present, please set up appointment sooner than planned today.

## 2016-08-13 ENCOUNTER — Encounter: Payer: Self-pay | Admitting: Physical Medicine & Rehabilitation

## 2016-08-13 ENCOUNTER — Encounter: Payer: Medicare Other | Attending: Physical Medicine & Rehabilitation | Admitting: Physical Medicine & Rehabilitation

## 2016-08-13 VITALS — BP 122/81 | HR 75 | Resp 14

## 2016-08-13 DIAGNOSIS — M5030 Other cervical disc degeneration, unspecified cervical region: Secondary | ICD-10-CM | POA: Insufficient documentation

## 2016-08-13 DIAGNOSIS — G8929 Other chronic pain: Secondary | ICD-10-CM | POA: Diagnosis not present

## 2016-08-13 DIAGNOSIS — E785 Hyperlipidemia, unspecified: Secondary | ICD-10-CM | POA: Insufficient documentation

## 2016-08-13 DIAGNOSIS — R102 Pelvic and perineal pain: Secondary | ICD-10-CM | POA: Diagnosis not present

## 2016-08-13 DIAGNOSIS — I10 Essential (primary) hypertension: Secondary | ICD-10-CM | POA: Insufficient documentation

## 2016-08-13 DIAGNOSIS — M19012 Primary osteoarthritis, left shoulder: Secondary | ICD-10-CM | POA: Diagnosis not present

## 2016-08-13 DIAGNOSIS — F4310 Post-traumatic stress disorder, unspecified: Secondary | ICD-10-CM | POA: Diagnosis not present

## 2016-08-13 DIAGNOSIS — Z79899 Other long term (current) drug therapy: Secondary | ICD-10-CM | POA: Diagnosis not present

## 2016-08-13 DIAGNOSIS — M25512 Pain in left shoulder: Secondary | ICD-10-CM | POA: Insufficient documentation

## 2016-08-13 DIAGNOSIS — M542 Cervicalgia: Secondary | ICD-10-CM | POA: Diagnosis not present

## 2016-08-13 DIAGNOSIS — G2581 Restless legs syndrome: Secondary | ICD-10-CM | POA: Diagnosis not present

## 2016-08-13 DIAGNOSIS — M19042 Primary osteoarthritis, left hand: Secondary | ICD-10-CM

## 2016-08-13 DIAGNOSIS — G894 Chronic pain syndrome: Secondary | ICD-10-CM

## 2016-08-13 DIAGNOSIS — F329 Major depressive disorder, single episode, unspecified: Secondary | ICD-10-CM | POA: Diagnosis not present

## 2016-08-13 DIAGNOSIS — Z5181 Encounter for therapeutic drug level monitoring: Secondary | ICD-10-CM | POA: Diagnosis not present

## 2016-08-13 DIAGNOSIS — M75102 Unspecified rotator cuff tear or rupture of left shoulder, not specified as traumatic: Secondary | ICD-10-CM | POA: Diagnosis not present

## 2016-08-13 DIAGNOSIS — T1490XA Injury, unspecified, initial encounter: Secondary | ICD-10-CM | POA: Insufficient documentation

## 2016-08-13 DIAGNOSIS — M47812 Spondylosis without myelopathy or radiculopathy, cervical region: Secondary | ICD-10-CM | POA: Diagnosis not present

## 2016-08-13 DIAGNOSIS — E119 Type 2 diabetes mellitus without complications: Secondary | ICD-10-CM | POA: Diagnosis not present

## 2016-08-13 DIAGNOSIS — M545 Low back pain: Secondary | ICD-10-CM | POA: Diagnosis not present

## 2016-08-13 MED ORDER — DICLOFENAC SODIUM 75 MG PO TBEC: 75.0000 mg | DELAYED_RELEASE_TABLET | Freq: Two times a day (BID) | ORAL | 4 refills | Status: DC

## 2016-08-13 MED ORDER — HYDROCODONE-ACETAMINOPHEN 5-325 MG PO TABS: 1.0000 | ORAL_TABLET | Freq: Three times a day (TID) | ORAL | 0 refills | Status: DC | PRN
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
## 2016-08-13 NOTE — Patient Instructions (Signed)
PLEASE FEEL FREE TO CALL OUR OFFICE WITH ANY PROBLEMS OR QUESTIONS ([phone])      

## 2016-08-14 DIAGNOSIS — R87610 Atypical squamous cells of undetermined significance on cytologic smear of cervix (ASC-US): Secondary | ICD-10-CM | POA: Diagnosis not present

## 2016-08-14 DIAGNOSIS — Z01411 Encounter for gynecological examination (general) (routine) with abnormal findings: Secondary | ICD-10-CM | POA: Diagnosis not present

## 2016-08-14 DIAGNOSIS — R87622 Low grade squamous intraepithelial lesion on cytologic smear of vagina (LGSIL): Secondary | ICD-10-CM | POA: Diagnosis not present

## 2016-08-17 LAB — TOXASSURE SELECT,+ANTIDEPR,UR

## 2016-08-17 MED ORDER — DULOXETINE HCL 60 MG PO CPEP: 60.0000 mg | ORAL_CAPSULE | Freq: Every day | ORAL | 1 refills | Status: DC

## 2016-08-19 ENCOUNTER — Telehealth: Payer: Self-pay | Admitting: *Deleted

## 2016-08-19 NOTE — Telephone Encounter (Signed)
Urine drug screen for this encounter is inconsistent for prescribed medication. There is lorazepam which is not prescribed.  I spoke with [name] and she said that her husband was very ill and her mother as well. She was staying with her and did not have her klonopin and took her mothers lorazepam.  I cautioned her about taking other people medications and not to share hers with anyone by her contract.  She has been advised.

## 2016-08-27 ENCOUNTER — Ambulatory Visit (INDEPENDENT_AMBULATORY_CARE_PROVIDER_SITE_OTHER): Payer: Medicare Other | Admitting: Orthopedic Surgery

## 2016-09-03 DIAGNOSIS — Z85828 Personal history of other malignant neoplasm of skin: Secondary | ICD-10-CM | POA: Diagnosis not present

## 2016-09-03 DIAGNOSIS — D1801 Hemangioma of skin and subcutaneous tissue: Secondary | ICD-10-CM | POA: Diagnosis not present

## 2016-09-03 DIAGNOSIS — L821 Other seborrheic keratosis: Secondary | ICD-10-CM | POA: Diagnosis not present

## 2016-09-03 DIAGNOSIS — L570 Actinic keratosis: Secondary | ICD-10-CM | POA: Diagnosis not present

## 2016-09-03 DIAGNOSIS — L814 Other melanin hyperpigmentation: Secondary | ICD-10-CM | POA: Diagnosis not present

## 2016-09-15 ENCOUNTER — Encounter: Payer: Medicare Other | Attending: Physical Medicine & Rehabilitation | Admitting: Physical Medicine & Rehabilitation

## 2016-09-15 ENCOUNTER — Encounter: Payer: Self-pay | Admitting: Physical Medicine & Rehabilitation

## 2016-09-15 VITALS — BP 130/81 | HR 86

## 2016-09-15 DIAGNOSIS — M545 Low back pain: Secondary | ICD-10-CM | POA: Diagnosis not present

## 2016-09-15 DIAGNOSIS — M47812 Spondylosis without myelopathy or radiculopathy, cervical region: Secondary | ICD-10-CM | POA: Diagnosis not present

## 2016-09-15 DIAGNOSIS — M5030 Other cervical disc degeneration, unspecified cervical region: Secondary | ICD-10-CM | POA: Insufficient documentation

## 2016-09-15 DIAGNOSIS — M19012 Primary osteoarthritis, left shoulder: Secondary | ICD-10-CM | POA: Diagnosis not present

## 2016-09-15 DIAGNOSIS — E785 Hyperlipidemia, unspecified: Secondary | ICD-10-CM | POA: Insufficient documentation

## 2016-09-15 DIAGNOSIS — M542 Cervicalgia: Secondary | ICD-10-CM | POA: Diagnosis not present

## 2016-09-15 DIAGNOSIS — R102 Pelvic and perineal pain: Secondary | ICD-10-CM | POA: Diagnosis not present

## 2016-09-15 DIAGNOSIS — F329 Major depressive disorder, single episode, unspecified: Secondary | ICD-10-CM | POA: Insufficient documentation

## 2016-09-15 DIAGNOSIS — M19042 Primary osteoarthritis, left hand: Secondary | ICD-10-CM

## 2016-09-15 DIAGNOSIS — M25512 Pain in left shoulder: Secondary | ICD-10-CM | POA: Diagnosis not present

## 2016-09-15 DIAGNOSIS — G2581 Restless legs syndrome: Secondary | ICD-10-CM | POA: Diagnosis not present

## 2016-09-15 DIAGNOSIS — T1490XA Injury, unspecified, initial encounter: Secondary | ICD-10-CM | POA: Diagnosis not present

## 2016-09-15 DIAGNOSIS — M75102 Unspecified rotator cuff tear or rupture of left shoulder, not specified as traumatic: Secondary | ICD-10-CM

## 2016-09-15 DIAGNOSIS — E119 Type 2 diabetes mellitus without complications: Secondary | ICD-10-CM | POA: Diagnosis not present

## 2016-09-15 DIAGNOSIS — I10 Essential (primary) hypertension: Secondary | ICD-10-CM | POA: Diagnosis not present

## 2016-09-15 DIAGNOSIS — G894 Chronic pain syndrome: Secondary | ICD-10-CM | POA: Diagnosis not present

## 2016-09-15 DIAGNOSIS — F4310 Post-traumatic stress disorder, unspecified: Secondary | ICD-10-CM | POA: Insufficient documentation

## 2016-09-15 DIAGNOSIS — G8929 Other chronic pain: Secondary | ICD-10-CM | POA: Diagnosis not present

## 2016-09-15 MED ORDER — HYDROCODONE-ACETAMINOPHEN 5-325 MG PO TABS: 1.0000 | ORAL_TABLET | Freq: Three times a day (TID) | ORAL | 0 refills | Status: DC | PRN

## 2016-09-15 NOTE — Patient Instructions (Signed)
CONTINUE WITH YOUR HOME EXERCISE PROGRAM.    PLEASE FEEL FREE TO CALL OUR OFFICE WITH ANY PROBLEMS OR QUESTIONS ([phone])
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
## 2016-09-17 ENCOUNTER — Encounter: Payer: Self-pay | Admitting: Gastroenterology

## 2016-09-18 ENCOUNTER — Ambulatory Visit: Payer: Medicare Other | Admitting: Gastroenterology

## 2016-10-20 ENCOUNTER — Encounter: Payer: Self-pay | Admitting: Registered Nurse

## 2016-10-20 ENCOUNTER — Encounter: Payer: Medicare Other | Attending: Physical Medicine & Rehabilitation | Admitting: Registered Nurse

## 2016-10-20 VITALS — BP 128/79 | HR 73

## 2016-10-20 DIAGNOSIS — G2581 Restless legs syndrome: Secondary | ICD-10-CM | POA: Diagnosis not present

## 2016-10-20 DIAGNOSIS — Z5181 Encounter for therapeutic drug level monitoring: Secondary | ICD-10-CM

## 2016-10-20 DIAGNOSIS — M19012 Primary osteoarthritis, left shoulder: Secondary | ICD-10-CM

## 2016-10-20 DIAGNOSIS — G8929 Other chronic pain: Secondary | ICD-10-CM | POA: Diagnosis not present

## 2016-10-20 DIAGNOSIS — M19042 Primary osteoarthritis, left hand: Secondary | ICD-10-CM | POA: Diagnosis not present

## 2016-10-20 DIAGNOSIS — E785 Hyperlipidemia, unspecified: Secondary | ICD-10-CM | POA: Insufficient documentation

## 2016-10-20 DIAGNOSIS — M75102 Unspecified rotator cuff tear or rupture of left shoulder, not specified as traumatic: Secondary | ICD-10-CM

## 2016-10-20 DIAGNOSIS — M545 Low back pain: Secondary | ICD-10-CM | POA: Insufficient documentation

## 2016-10-20 DIAGNOSIS — G894 Chronic pain syndrome: Secondary | ICD-10-CM | POA: Diagnosis not present

## 2016-10-20 DIAGNOSIS — F329 Major depressive disorder, single episode, unspecified: Secondary | ICD-10-CM | POA: Insufficient documentation

## 2016-10-20 DIAGNOSIS — Z79899 Other long term (current) drug therapy: Secondary | ICD-10-CM | POA: Diagnosis not present

## 2016-10-20 DIAGNOSIS — M25512 Pain in left shoulder: Secondary | ICD-10-CM | POA: Insufficient documentation

## 2016-10-20 DIAGNOSIS — F4310 Post-traumatic stress disorder, unspecified: Secondary | ICD-10-CM | POA: Diagnosis not present

## 2016-10-20 DIAGNOSIS — M542 Cervicalgia: Secondary | ICD-10-CM | POA: Diagnosis not present

## 2016-10-20 DIAGNOSIS — I10 Essential (primary) hypertension: Secondary | ICD-10-CM | POA: Insufficient documentation

## 2016-10-20 DIAGNOSIS — M47812 Spondylosis without myelopathy or radiculopathy, cervical region: Secondary | ICD-10-CM

## 2016-10-20 DIAGNOSIS — M5030 Other cervical disc degeneration, unspecified cervical region: Secondary | ICD-10-CM | POA: Insufficient documentation

## 2016-10-20 DIAGNOSIS — R102 Pelvic and perineal pain: Secondary | ICD-10-CM | POA: Insufficient documentation

## 2016-10-20 DIAGNOSIS — T1490XA Injury, unspecified, initial encounter: Secondary | ICD-10-CM | POA: Insufficient documentation

## 2016-10-20 DIAGNOSIS — E119 Type 2 diabetes mellitus without complications: Secondary | ICD-10-CM | POA: Insufficient documentation

## 2016-10-20 MED ORDER — HYDROCODONE-ACETAMINOPHEN 5-325 MG PO TABS: 1.0000 | ORAL_TABLET | Freq: Three times a day (TID) | ORAL | 0 refills | Status: DC | PRN
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
## 2016-10-20 NOTE — Progress Notes (Signed)
   Subjective:    Patient ID: [name], female    DOB: [birth year], 66 y.o.   MRN: [id number]  HPI    Review of Systems  Respiratory: Positive for shortness of breath.   All other systems reviewed and are negative.      Objective:   Physical Exam        Assessment & Plan:

## 2016-10-21 ENCOUNTER — Other Ambulatory Visit: Payer: Self-pay | Admitting: Family Medicine

## 2016-10-21 MED ORDER — HYDROCHLOROTHIAZIDE 25 MG PO TABS: 25.0000 mg | ORAL_TABLET | Freq: Every day | ORAL | 1 refills | Status: DC

## 2016-10-22 ENCOUNTER — Other Ambulatory Visit: Payer: Self-pay

## 2016-10-22 MED ORDER — CLONAZEPAM 1 MG PO TABS: 1.0000 mg | ORAL_TABLET | Freq: Two times a day (BID) | ORAL | 0 refills | Status: DC | PRN

## 2016-10-22 NOTE — Telephone Encounter (Signed)
Last filled on 09/22/16. Rx for Clonazepam 1 mg can be called in to continue bid as needed. #60/0 She should have a f/u appt in 11/2016, 3 months after last OV.  Thanks, BJ

## 2016-10-22 NOTE — Telephone Encounter (Signed)
Refill request for Clonazepam.  Rx last filled 09/22/16. Pt has upcoming appt on 11/11/16. Okay to refill?

## 2016-10-24 NOTE — Telephone Encounter (Signed)
Rx phoned in.   

## 2016-10-27 ENCOUNTER — Telehealth: Payer: Self-pay | Admitting: Registered Nurse

## 2016-10-27 NOTE — Telephone Encounter (Signed)
On 10/27/2016 the  [place] was reviewed no conflict was seen on the [place] with multiple prescribers. Ms. Newingham has a signed narcotic contract with our office. If there were any discrepancies this would have been reported to her physician.

## 2016-11-10 ENCOUNTER — Ambulatory Visit: Payer: Medicare Other | Admitting: Family Medicine

## 2016-11-10 DIAGNOSIS — H10021 Other mucopurulent conjunctivitis, right eye: Secondary | ICD-10-CM | POA: Diagnosis not present

## 2016-11-10 NOTE — Progress Notes (Deleted)
HPI:   Ms.Ashima Oneka Parada is a 66 y.o. female, who is here today to follow on some chronic medical problems.  Diabetes Mellitus II:   Since 2014. Currently on Farxiga 10 mg . Problem has been stable, last HgA1C 6.9 in 06/2016.  Checking BS's : *** Hypoglycemia:  *** is tolerating medications well. *** denies abdominal pain, nausea, vomiting, polydipsia, polyuria, or polyphagia. No foot numbness, tingling, or burning.     Lab Results  Component Value Date   CREATININE 0.89 05/31/2016   BUN 17 05/31/2016   NA 139 05/31/2016   K 3.7 05/31/2016   CL 101 05/31/2016   CO2 29 05/31/2016    Lab Results  Component Value Date   HGBA1C  03/21/2008    5.6 (NOTE)   The ADA recommends the following therapeutic goal for glycemic   control related to Hgb A1C measurement:   Goal of Therapy:   < 7.0% Hgb A1C   Reference: American Diabetes Association: Clinical Practice   Recommendations 2008, Diabetes Care,  2008, 31:(Suppl 1).    Anxiety and depression:  She is on Klonopin 1 mg bid as needed and Cymbalta 60 gm daily.   HTN: Currently she is on HCTZ 25 mg daily.  Denies severe/frequent headache, visual changes, chest pain, dyspnea, palpitation, claudication, focal weakness, or edema.  GERD: Nexium 40 mg started last OV. ***  Review of Systems    Current Outpatient Prescriptions on File Prior to Visit  Medication Sig Dispense Refill  . Blood Glucose Monitoring Suppl (ACCU-CHEK AVIVA) device Use as instructed 1 each 0  . clonazePAM (KLONOPIN) 1 MG tablet Take 1 tablet (1 mg total) by mouth 2 (two) times daily as needed for anxiety. 60 tablet 0  . dapagliflozin propanediol (FARXIGA) 10 MG TABS tablet Take 1 tablet by mouth daily.    . diclofenac (VOLTAREN) 75 MG EC tablet Take 1 tablet (75 mg total) by mouth 2 (two) times daily with a meal. 60 tablet 4  . diclofenac sodium (VOLTAREN) 1 % GEL Apply topically to affected area qid 100 g 2  . DULoxetine (CYMBALTA)  60 MG capsule Take 1 capsule (60 mg total) by mouth daily. 90 capsule 1  . esomeprazole (NEXIUM) 40 MG capsule Take 1 capsule (40 mg total) by mouth 2 (two) times daily before a meal. 60 capsule 1  . gabapentin (NEURONTIN) 400 MG capsule Take 800 mg by mouth at bedtime.     Marland Kitchen glucose blood (ACCU-CHEK AVIVA) test strip Use to test once daily. 100 each 2  . hydrochlorothiazide (HYDRODIURIL) 25 MG tablet Take 1 tablet (25 mg total) by mouth daily. 90 tablet 1  . HYDROcodone-acetaminophen (NORCO/VICODIN) 5-325 MG tablet Take 1 tablet by mouth every 8 (eight) hours as needed for moderate pain. 80 tablet 0  . Lancet Devices (ACCU-CHEK SOFTCLIX) lancets Use to test blood sugar once daily. 1 each 2  . rosuvastatin (CRESTOR) 10 MG tablet Take 1 tablet by mouth daily.     . valACYclovir (VALTREX) 500 MG tablet Take 500 mg by mouth daily.     No current facility-administered medications on file prior to visit.      Past Medical History:  Diagnosis Date  . Anxiety   . Diabetes mellitus without complication ([place])   . Hyperlipidemia   . Hypertension   . PTSD (post-traumatic stress disorder)    Allergies  Allergen Reactions  . Penicillins Rash    Has patient had a PCN reaction causing immediate  rash, facial/tongue/throat swelling, SOB or lightheadedness with hypotension: Yes Has patient had a PCN reaction causing severe rash involving mucus membranes or skin necrosis: No Has patient had a PCN reaction that required hospitalization No Has patient had a PCN reaction occurring within the last 10 years: No If all of the above answers are "NO", then may proceed with Cephalosporin use.     Social History   Social History  . Marital status: Divorced    Spouse name: N/A  . Number of children: N/A  . Years of education: N/A   Social History Main Topics  . Smoking status: Never Smoker  . Smokeless tobacco: Never Used  . Alcohol use No  . Drug use: No  . Sexual activity: Not on file   Other  Topics Concern  . Not on file   Social History Narrative  . No narrative on file    There were no vitals filed for this visit. There is no height or weight on file to calculate BMI.   Physical Exam   Foot exam: 08/11/16.  ASSESSMENT AND PLAN:     There are no diagnoses linked to this encounter.         -Ms. Nashalie Sallis Beane was advised to return sooner than planned today if new concerns arise.       Betty G. Martinique, MD  [facility] [place] office.

## 2016-11-11 ENCOUNTER — Ambulatory Visit: Payer: Medicare Other | Admitting: Family Medicine

## 2016-11-13 ENCOUNTER — Encounter: Payer: Self-pay | Admitting: *Deleted

## 2016-11-17 ENCOUNTER — Encounter: Payer: Medicare Other | Attending: Physical Medicine & Rehabilitation | Admitting: Registered Nurse

## 2016-11-17 ENCOUNTER — Encounter: Payer: Self-pay | Admitting: Registered Nurse

## 2016-11-17 VITALS — BP 122/80 | HR 83

## 2016-11-17 DIAGNOSIS — E119 Type 2 diabetes mellitus without complications: Secondary | ICD-10-CM | POA: Diagnosis not present

## 2016-11-17 DIAGNOSIS — G8929 Other chronic pain: Secondary | ICD-10-CM | POA: Diagnosis not present

## 2016-11-17 DIAGNOSIS — F329 Major depressive disorder, single episode, unspecified: Secondary | ICD-10-CM | POA: Diagnosis not present

## 2016-11-17 DIAGNOSIS — I10 Essential (primary) hypertension: Secondary | ICD-10-CM | POA: Insufficient documentation

## 2016-11-17 DIAGNOSIS — T1490XA Injury, unspecified, initial encounter: Secondary | ICD-10-CM | POA: Insufficient documentation

## 2016-11-17 DIAGNOSIS — R102 Pelvic and perineal pain: Secondary | ICD-10-CM | POA: Insufficient documentation

## 2016-11-17 DIAGNOSIS — M542 Cervicalgia: Secondary | ICD-10-CM | POA: Diagnosis not present

## 2016-11-17 DIAGNOSIS — G894 Chronic pain syndrome: Secondary | ICD-10-CM | POA: Diagnosis not present

## 2016-11-17 DIAGNOSIS — M5030 Other cervical disc degeneration, unspecified cervical region: Secondary | ICD-10-CM | POA: Insufficient documentation

## 2016-11-17 DIAGNOSIS — M47812 Spondylosis without myelopathy or radiculopathy, cervical region: Secondary | ICD-10-CM

## 2016-11-17 DIAGNOSIS — Z5181 Encounter for therapeutic drug level monitoring: Secondary | ICD-10-CM | POA: Diagnosis not present

## 2016-11-17 DIAGNOSIS — M25512 Pain in left shoulder: Secondary | ICD-10-CM | POA: Diagnosis not present

## 2016-11-17 DIAGNOSIS — G2581 Restless legs syndrome: Secondary | ICD-10-CM | POA: Insufficient documentation

## 2016-11-17 DIAGNOSIS — M19012 Primary osteoarthritis, left shoulder: Secondary | ICD-10-CM

## 2016-11-17 DIAGNOSIS — M75102 Unspecified rotator cuff tear or rupture of left shoulder, not specified as traumatic: Secondary | ICD-10-CM | POA: Diagnosis not present

## 2016-11-17 DIAGNOSIS — E785 Hyperlipidemia, unspecified: Secondary | ICD-10-CM | POA: Diagnosis not present

## 2016-11-17 DIAGNOSIS — M545 Low back pain: Secondary | ICD-10-CM | POA: Insufficient documentation

## 2016-11-17 DIAGNOSIS — Z79899 Other long term (current) drug therapy: Secondary | ICD-10-CM

## 2016-11-17 DIAGNOSIS — F4310 Post-traumatic stress disorder, unspecified: Secondary | ICD-10-CM | POA: Insufficient documentation

## 2016-11-17 MED ORDER — HYDROCODONE-ACETAMINOPHEN 5-325 MG PO TABS: 1.0000 | ORAL_TABLET | Freq: Three times a day (TID) | ORAL | 0 refills | Status: DC | PRN
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
## 2016-11-18 ENCOUNTER — Encounter: Payer: Self-pay | Admitting: Gastroenterology

## 2016-11-18 ENCOUNTER — Ambulatory Visit (INDEPENDENT_AMBULATORY_CARE_PROVIDER_SITE_OTHER): Payer: Medicare Other | Admitting: Gastroenterology

## 2016-11-18 VITALS — BP 124/80 | HR 72 | Ht 68.0 in | Wt 189.2 lb

## 2016-11-18 DIAGNOSIS — R140 Abdominal distension (gaseous): Secondary | ICD-10-CM

## 2016-11-18 DIAGNOSIS — K5900 Constipation, unspecified: Secondary | ICD-10-CM

## 2016-11-18 DIAGNOSIS — R1030 Lower abdominal pain, unspecified: Secondary | ICD-10-CM | POA: Diagnosis not present

## 2016-11-18 DIAGNOSIS — K219 Gastro-esophageal reflux disease without esophagitis: Secondary | ICD-10-CM

## 2016-11-18 DIAGNOSIS — R1310 Dysphagia, unspecified: Secondary | ICD-10-CM

## 2016-11-18 MED ORDER — OMEPRAZOLE 40 MG PO CPDR: 40.0000 mg | DELAYED_RELEASE_CAPSULE | Freq: Every day | ORAL | 3 refills | Status: DC

## 2016-11-18 MED ORDER — RANITIDINE HCL 150 MG PO TABS: 150.0000 mg | ORAL_TABLET | Freq: Every day | ORAL | 3 refills | Status: DC

## 2016-11-18 NOTE — Patient Instructions (Signed)
If you are age 65 or older, your body mass index should be between 23-30. Your Body mass index is 28.78 kg/m. If this is out of the aforementioned range listed, please consider follow up with your Primary Care Provider.  If you are age 36 or younger, your body mass index should be between 19-25. Your Body mass index is 28.78 kg/m. If this is out of the aformentioned range listed, please consider follow up with your Primary Care Provider.   We have sent the following medications to your pharmacy for you to pick up at your convenience:  Prilosec  Zantac  Samples of Movantik and Linzess to try. If you need a prescription for these call us and let us know.  You have been scheduled for an endoscopy. Please follow written instructions given to you at your visit today. If you use inhalers (even only as needed), please bring them with you on the day of your procedure. Your physician has requested that you go to www.startemmi.com and enter the access code given to you at your visit today. This web site gives a general overview about your procedure. However, you should still follow specific instructions given to you by our office regarding your preparation for the procedure.  Thank you.

## 2016-11-18 NOTE — Progress Notes (Signed)
[name]    [id number]    11-02-1950  Primary Care Physician:Jordan, Malka So, MD  Referring Physician: Martinique, Betty G, MD [place], [place] [address]  Chief complaint:  GERD, Abdominal pain, Emesis, Constipation, Gas  HPI: 66 year old female previously followed by Dr Deatra Ina is here to establish care with complaints of heartburn, nausea, vomiting, constipation, bloating with excessive belching and lower abdominal pain. Patient has history of chronic GERD and she was prescribed pantoprazole, patient feels is not helping her symptoms. She has intermittent sensation of food getting hung up in her throat, mostly with solids. Denies any odynophagia. She is on chronic narcotics. Other medical history includes diabetes, neuropathy and hypertension. Patient also has chronic nausea and intermittent vomiting. She has chronic constipation with hard stool and sometimes has lower abdominal cramps associated with excessive straining. She has intermittent bright red blood per rectum associated with excessive straining.  EGD February 2010: Normal Colonoscopy December 2009: Normal except for internal hemorrhoids   Outpatient Encounter Prescriptions as of 11/18/2016  Medication Sig  . Blood Glucose Monitoring Suppl (ACCU-CHEK AVIVA) device Use as instructed  . clonazePAM (KLONOPIN) 1 MG tablet Take 1 tablet (1 mg total) by mouth 2 (two) times daily as needed for anxiety.  . dapagliflozin propanediol (FARXIGA) 10 MG TABS tablet Take 1 tablet by mouth daily.  . diclofenac (VOLTAREN) 75 MG EC tablet Take 1 tablet (75 mg total) by mouth 2 (two) times daily with a meal.  . diclofenac sodium (VOLTAREN) 1 % GEL Apply topically to affected area qid  . DULoxetine (CYMBALTA) 60 MG capsule Take 1 capsule (60 mg total) by mouth daily.  Marland Kitchen gabapentin (NEURONTIN) 400 MG capsule Take 800 mg by mouth at bedtime.   Marland Kitchen glucose blood (ACCU-CHEK AVIVA) test strip Use to test once daily.   . hydrochlorothiazide (HYDRODIURIL) 25 MG tablet Take 1 tablet (25 mg total) by mouth daily.  Marland Kitchen HYDROcodone-acetaminophen (NORCO/VICODIN) 5-325 MG tablet Take 1 tablet by mouth every 8 (eight) hours as needed for moderate pain.  Elmore Guise Devices (ACCU-CHEK SOFTCLIX) lancets Use to test blood sugar once daily.  . rosuvastatin (CRESTOR) 10 MG tablet Take 1 tablet by mouth daily.   . valACYclovir (VALTREX) 500 MG tablet Take 500 mg by mouth daily.   No facility-administered encounter medications on file as of 11/18/2016.     Allergies as of 11/18/2016 - Review Complete 11/17/2016  Allergen Reaction Noted  . Penicillins Rash 06/16/2014    Past Medical History:  Diagnosis Date  . Anxiety   . Arthritis   . Diabetes mellitus without complication ([facility] [place])   . Hyperlipidemia   . Hypertension   . PTSD (post-traumatic stress disorder)     Past Surgical History:  Procedure Laterality Date  . ABDOMINAL HYSTERECTOMY    . ANKLE SURGERY    . BACK SURGERY    . CHOLECYSTECTOMY    . NOSE SURGERY     x 2   . ROTATOR CUFF REPAIR      No family history on file.  Social History   Social History  . Marital status: Divorced    Spouse name: N/A  . Number of children: N/A  . Years of education: N/A   Occupational History  . Not on file.   Social History Main Topics  . Smoking status: Never Smoker  . Smokeless tobacco: Never Used  . Alcohol use No  . Drug use: No  .  Sexual activity: Not on file   Other Topics Concern  . Not on file   Social History Narrative  . No narrative on file      Review of systems: Review of Systems  Constitutional: Negative for fever and chills.  positive for fatigue HENT: Positive for sinus problems Eyes: Negative for blurred vision.  Respiratory: Negative for cough, shortness of breath and wheezing.   Cardiovascular: Negative for chest pain and palpitations.  Gastrointestinal: as per HPI Genitourinary: Negative for dysuria, urgency, frequency and  hematuria.  Musculoskeletal: Positive for myalgias, back pain and joint pain.  Skin: Negative for itching and rash.  Neurological: Negative for dizziness, tremors, focal weakness, seizures and loss of consciousness.  Endo/Heme/Allergies: Positive for seasonal allergies.  Psychiatric/Behavioral: Negative for suicidal ideas and hallucinations.  positive for depression and anxiety All other systems reviewed and are negative.   Physical Exam: Vitals:   11/18/16 1340  BP: 124/80  Pulse: 72   Body mass index is 28.78 kg/m. Gen:      No acute distress HEENT:  EOMI, sclera anicteric Neck:     No masses; no thyromegaly Lungs:    Clear to auscultation bilaterally; normal respiratory effort CV:         Regular rate and rhythm; no murmurs Abd:      + bowel sounds; soft, non-tender; no palpable masses, no distension Ext:    No edema; adequate peripheral perfusion Skin:      Warm and dry; no rash Neuro: alert and oriented x 3 Psych: normal mood and affect  Data Reviewed:  Reviewed labs, radiology imaging, old records and pertinent past GI work up   Assessment and Plan/Recommendations:  66 year old female with diabetes, hypertension, chronic narcotic use, chronic GERD here with complaints of worsening heartburn, intermittent dysphagia, worsening constipation and bloating associated with lower abdominal cramps  GERD: Discussed antireflux measures in detail Prilosec 40 mg daily, 30 minutes before breakfast and Zantac 150 mg at bedtime  Intermittent dysphagia: We'll schedule for EGD for evaluation with possible esophageal dilation The risks and benefits as well as alternatives of endoscopic procedure(s) have been discussed and reviewed. All questions answered. The patient agrees to proceed.  Chronic constipation: Exacerbated with chronic narcotic use Start Linzess 290 g daily and Movantik 25mg  daily  Intermittent nausea and vomiting could be secondary to chronic narcotic use and  uncontrolled DM , ? Gastroparesis We will consider gastric emptying scan if patient continues to have persistent symptoms despite adequate control of GERD and improvement of constipation    K. Denzil Magnuson , MD [phone] Mon-Fri 8a-5p [phone] after 5p, weekends, holidays  CC: Martinique, Betty G, MD

## 2016-11-19 ENCOUNTER — Encounter: Payer: Self-pay | Admitting: Family Medicine

## 2016-11-19 ENCOUNTER — Ambulatory Visit (INDEPENDENT_AMBULATORY_CARE_PROVIDER_SITE_OTHER): Payer: Medicare Other | Admitting: Family Medicine

## 2016-11-19 VITALS — BP 122/80 | HR 78 | Resp 12 | Ht 68.0 in | Wt 186.6 lb

## 2016-11-19 DIAGNOSIS — R0789 Other chest pain: Secondary | ICD-10-CM

## 2016-11-19 DIAGNOSIS — R42 Dizziness and giddiness: Secondary | ICD-10-CM | POA: Diagnosis not present

## 2016-11-19 DIAGNOSIS — R0609 Other forms of dyspnea: Secondary | ICD-10-CM | POA: Diagnosis not present

## 2016-11-19 DIAGNOSIS — R0600 Dyspnea, unspecified: Secondary | ICD-10-CM

## 2016-11-19 DIAGNOSIS — E118 Type 2 diabetes mellitus with unspecified complications: Secondary | ICD-10-CM

## 2016-11-19 DIAGNOSIS — F411 Generalized anxiety disorder: Secondary | ICD-10-CM | POA: Diagnosis not present

## 2016-11-19 LAB — GLUCOSE, POCT (MANUAL RESULT ENTRY): POC Glucose: 161 mg/dl — AB (ref 70–99)

## 2016-11-19 MED ORDER — MECLIZINE HCL 25 MG PO TABS: 25.0000 mg | ORAL_TABLET | Freq: Three times a day (TID) | ORAL | 0 refills | Status: DC | PRN

## 2016-11-19 NOTE — Progress Notes (Signed)
ACUTE VISIT   HPI:  Chief Complaint  Patient presents with  . Dizziness    [name] is a 66 y.o. female, who is here today complaining of 2 days of intermittent episodes of dizziness. She is not clear about having prior Hx of vertigo. She feels like her "balance is off."  She attributes symptoms to stress. 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 She cancelled last F/U appt.  DM II, she has not checked BS's.   Dizziness  This is a new problem. The current episode started in the past 7 days. The problem occurs intermittently. The problem has been unchanged. Associated symptoms include arthralgias, fatigue, headaches (global: temporal,parietal,and occipital pressure/dull pain. Chronic), myalgias, nausea, neck pain, vertigo, a visual change and vomiting (chronic, pending EGD). Pertinent negatives include no abdominal pain, change in bowel habit, chills, coughing, fever, rash, sore throat, swollen glands, urinary symptoms or weakness. The symptoms are aggravated by standing and bending. She has tried rest for the symptoms. The treatment provided mild relief.  Dizzy episodes last about 30-40 min. She also has dizzy spells after eating.    For the past 2 months she has also noted exertional dyspnea, usually with walking up stairs and walking to the mailbox. She denies associated chest pain but mentions that she has had intermittent "chest tightness", for a while, which she has associated to stress and anxiety.States that she is "totally to the max of stress." She also tells me that she can feel her "heart beating around" her head, denies palpitations.  One time she felt clammy, but in general chest discomfort is not associated with diaphoresis.  Most of positive ROS are not related to dizziness but rather chronic problems. Hx of chronic pain,gastroparesis,depression,and anxiety.   Review of Systems  Constitutional: Positive for  fatigue. Negative for chills and fever.  HENT: Negative for hearing loss, mouth sores, sore throat, tinnitus and trouble swallowing.   Eyes: Positive for discharge (epiphora) and visual disturbance. Negative for pain and redness.  Respiratory: Positive for chest tightness and shortness of breath. Negative for cough and wheezing.   Cardiovascular: Negative for palpitations and leg swelling.  Gastrointestinal: Positive for nausea and vomiting (chronic, pending EGD). Negative for abdominal pain and change in bowel habit.       No changes in bowel habits.  Endocrine: Positive for polydipsia and polyuria. Negative for cold intolerance and heat intolerance.  Genitourinary: Negative for decreased urine volume, dysuria and hematuria.  Musculoskeletal: Positive for arthralgias, myalgias and neck pain.  Skin: Negative for pallor and rash.  Neurological: Positive for dizziness, vertigo and headaches (global: temporal,parietal,and occipital pressure/dull pain. Chronic). Negative for syncope, facial asymmetry, speech difficulty and weakness.  Psychiatric/Behavioral: Positive for sleep disturbance. Negative for confusion, hallucinations and suicidal ideas. The patient is nervous/anxious.       Current Outpatient Prescriptions on File Prior to Visit  Medication Sig Dispense Refill  . clonazePAM (KLONOPIN) 1 MG tablet Take 1 tablet (1 mg total) by mouth 2 (two) times daily as needed for anxiety. 60 tablet 0  . dapagliflozin propanediol (FARXIGA) 10 MG TABS tablet Take 1 tablet by mouth daily.    . diclofenac (VOLTAREN) 75 MG EC tablet Take 1 tablet (75 mg total) by mouth 2 (two) times daily with a meal. 60 tablet 4  . diclofenac sodium (VOLTAREN) 1 % GEL Apply topically to affected area qid 100 g 2  . DULoxetine (CYMBALTA) 60 MG  capsule Take 1 capsule (60 mg total) by mouth daily. 90 capsule 1  . gabapentin (NEURONTIN) 400 MG capsule Take 800 mg by mouth at bedtime.     . hydrochlorothiazide (HYDRODIURIL)  25 MG tablet Take 1 tablet (25 mg total) by mouth daily. 90 tablet 1  . HYDROcodone-acetaminophen (NORCO/VICODIN) 5-325 MG tablet Take 1 tablet by mouth every 8 (eight) hours as needed for moderate pain. 80 tablet 0  . omeprazole (PRILOSEC) 40 MG capsule Take 1 capsule (40 mg total) by mouth daily. 90 capsule 3  . ranitidine (ZANTAC) 150 MG tablet Take 1 tablet (150 mg total) by mouth at bedtime. 30 tablet 3  . rosuvastatin (CRESTOR) 10 MG tablet Take 1 tablet by mouth daily.     . valACYclovir (VALTREX) 500 MG tablet Take 500 mg by mouth daily.     No current facility-administered medications on file prior to visit.      Past Medical History:  Diagnosis Date  . Anxiety   . Arthritis   . Diabetes mellitus without complication ([place])   . Hyperlipidemia   . Hypertension   . PTSD (post-traumatic stress disorder)    Allergies  Allergen Reactions  . Penicillins Rash    Has patient had a PCN reaction causing immediate rash, facial/tongue/throat swelling, SOB or lightheadedness with hypotension: Yes Has patient had a PCN reaction causing severe rash involving mucus membranes or skin necrosis: No Has patient had a PCN reaction that required hospitalization No Has patient had a PCN reaction occurring within the last 10 years: No If all of the above answers are "NO", then may proceed with Cephalosporin use.     Social History   Social History  . Marital status: Divorced    Spouse name: N/A  . Number of children: N/A  . Years of education: N/A   Social History Main Topics  . Smoking status: Never Smoker  . Smokeless tobacco: Never Used  . Alcohol use No  . Drug use: No  . Sexual activity: Not Asked   Other Topics Concern  . None   Social History Narrative  . None    Vitals:   11/19/16 0958  BP: 122/80  Pulse: 78  Resp: 12  SpO2: 96%   Body mass index is 28.37 kg/m.   Physical Exam  Nursing note and vitals reviewed. Constitutional: She is oriented to person,  place, and time. She appears well-developed. She does not appear ill. No distress.  HENT:  Head: Atraumatic.  Right Ear: Hearing, tympanic membrane, external ear and ear canal normal.  Left Ear: Hearing, tympanic membrane, external ear and ear canal normal.  Mouth/Throat: Oropharynx is clear and moist and mucous membranes are normal.  Apley maneuver + Nystagmus present.  Eyes: Pupils are equal, round, and reactive to light. Conjunctivae are normal.  Neck: Carotid bruit is not present.  Cardiovascular: Normal rate and regular rhythm.   No murmur heard. Pulses:      Dorsalis pedis pulses are 2+ on the right side, and 2+ on the left side.  Respiratory: Effort normal and breath sounds normal. No respiratory distress. She exhibits no tenderness.  GI: Soft. She exhibits no mass. There is no hepatomegaly. There is no tenderness.  Musculoskeletal: She exhibits no edema.       Cervical back: She exhibits decreased range of motion and tenderness. She exhibits no bony tenderness.  Lymphadenopathy:    She has no cervical adenopathy.  Neurological: She is alert and oriented to person, place, and time.  She has normal strength. No cranial nerve deficit. Gait normal.  Skin: Skin is warm. No rash noted. No cyanosis or erythema.  Psychiatric: Her mood appears anxious.  Fairly groomed, poor eye contact.    ASSESSMENT AND PLAN:   Ms [name] was seen today for dizziness.  Diagnoses and all orders for this visit:  Chest discomfort  We discussed possible etiologies: musculoskeletal,psychiatric,GI, pulmonology,and cardiac. CV risk factors: DM,HTN,and HLD.  EKG today: SR, LAD,LAHB, unspecific T wave abnormalities inferior leads. Compared with EKG 05/2016 T wave abnormality D III now present, rest no significant changes.  09/2011 stress test done due to chest pain:   1.Negative electrical geographic response to pharmacologic as. 2. Normal myocardial perfusion scan. 3. Normal left ventricular systolic  function wall motion.  Instructed about warning signs.  -     Ambulatory referral to Cardiology  Dyspnea on exertion  Reported as a new problem.  ? [place]. No Hx of tobacco use. Cardio evaluation recommended.  -     EKG 12-Lead -     Ambulatory referral to Cardiology  Dizziness, nonspecific  We discussed other possible etiologies of dizziness, Hx and examination today suggest benign vertigo. If worsening or persient symptoms for more that 2 weeks; in this case ENT will be considered. Explained that problem can be recurrent. Fall prevention. Vestibular exercises recommended, handout with Semont maneuvers given. Meclizine 25 mg tid prn, some side effects discussed. Instructed about warning signs. F/U as needed.  -     meclizine (ANTIVERT) 25 MG tablet; Take 1 tablet (25 mg total) by mouth 3 (three) times daily as needed for dizziness.  Generalized anxiety disorder  Not well controlled. Problem can aggravate or caused above problems. She is on Clonazepam and Cymbalta. No changes in current management.   Type 2 diabetes mellitus with complication, without long-term current use of insulin ([facility])  Glucose 161. Glucometer given today. No changes in current management. Recommend eye examination.  -     POC Glucose (CBG)     Return if symptoms worsen or fail to improve, for Please reschedule f/u appt.     -[name] was advised to seek immediate medical attention if sudden worsening symptoms or to follow if they persist or if new concerns arise.       Damonie Furney G. Martinique, MD  [facility]. [place] office.

## 2016-11-19 NOTE — Patient Instructions (Addendum)
  Ms.Tea Maretta Bees I have seen you today for an acute visit.  A few things to remember from today's visit:   Dyspnea on exertion - Plan: EKG 12-Lead, Ambulatory referral to Cardiology  Type 2 diabetes mellitus with complication, without long-term current use of insulin ([place]) - Plan: POC Glucose (CBG)  Dizziness, nonspecific - Plan: meclizine (ANTIVERT) 25 MG tablet  ANXIETY DEPRESSION  Chest discomfort - Plan: Ambulatory referral to Cardiology    Dizziness is a perception of movement, it is sometimes difficult to describe and can be  caused by different problems, most benign but others can be life threaten.  Vertigo is the most common cause of dizziness, usually related with inner ear and can be associated with nausea, vomiting, and unbalance sensation. It can be complicated by falls due to lose of balance; so fall precautions are very important.  Most of the time dizziness is benign, usually intermittent, last a few seconds at the time and aggravated by certain positions. It usually resolves in a few weeks without residual effect but it could be recurrent.  Sometimes blood work is ordered to evaluate for other possible causes.  Dizziness can also be caused by certain medications, dehydration, migraines, and strokes.  Medication prescribed for vertigo, Meclizine, causes drowsiness/sleepiness, so frequently I recommended taking it at bedtime. I also recommend what we called vestibular exercise, sometimes can be done at home (Modified Semont maneuvers) other times I refer patients to vestibular rehabilitation.   Seek immediate medical attention if: New severe headache, dobble vision, fever (100 F or more), associated numbness/tingling, focal weakness, persistent vomiting, not able to walk, or sudden worsening symptoms.   In general please monitor for signs of worsening symptoms and seek immediate medical attention if any concerning.  Please re-schedule follow up visit you  cancelled recently.

## 2016-11-21 ENCOUNTER — Encounter: Payer: Self-pay | Admitting: Family Medicine

## 2016-11-22 ENCOUNTER — Encounter: Payer: Self-pay | Admitting: Family Medicine

## 2016-11-24 ENCOUNTER — Other Ambulatory Visit: Payer: Self-pay

## 2016-11-24 LAB — DRUG TOX ALC METAB W/CON, ORAL FLD: Alcohol Metabolite: NEGATIVE ng/mL (ref <25)

## 2016-11-24 LAB — DRUG TOX MONITOR 1 W/CONF, ORAL FLD
Amphetamines: NEGATIVE ng/mL (ref <10)
Barbiturates: NEGATIVE ng/mL (ref <10)
Benzodiazepines: NEGATIVE ng/mL (ref <0.50)
Buprenorphine: NEGATIVE ng/mL (ref <0.025)
Cocaine: NEGATIVE ng/mL (ref <2.5)
Fentanyl: NEGATIVE ng/mL (ref <0.10)
Heroin Metabolite: NEGATIVE ng/mL (ref <1.0)
MDMA: NEGATIVE ng/mL (ref <10)
Marijuana: NEGATIVE ng/mL (ref <2.5)
Meperidine: NEGATIVE ng/mL (ref <5.0)
Meprobamate: NEGATIVE ng/mL (ref <2.5)
Methadone: NEGATIVE ng/mL (ref <5.0)
Nicotine Metabolite: NEGATIVE ng/mL (ref <5.0)
Opiates: NEGATIVE ng/mL (ref <2.5)
Phencyclidine: NEGATIVE ng/mL (ref <10)
Propoxyphene: NEGATIVE ng/mL (ref <5.0)
Tapentadol: NEGATIVE ng/mL (ref <5.0)
Tramadol: NEGATIVE ng/mL (ref <5.0)
Zolpidem: NEGATIVE ng/mL (ref <5.0)

## 2016-11-24 MED ORDER — DAPAGLIFLOZIN PROPANEDIOL 10 MG PO TABS: 10.0000 mg | ORAL_TABLET | Freq: Every day | ORAL | 2 refills | Status: DC

## 2016-11-25 ENCOUNTER — Other Ambulatory Visit: Payer: Self-pay | Admitting: Family Medicine

## 2016-11-25 ENCOUNTER — Telehealth: Payer: Self-pay | Admitting: *Deleted

## 2016-11-25 NOTE — Telephone Encounter (Signed)
Rx last filled 10/24/16.  Okay to refill?

## 2016-11-25 NOTE — Telephone Encounter (Signed)
Oral swab drug screen was negative for medication but she reported last dose was the day before. There for this could be considered consistent. Last urine screen was positive for medications so no red flag.

## 2016-11-26 MED ORDER — CLONAZEPAM 1 MG PO TABS: ORAL_TABLET | ORAL | 0 refills | Status: DC

## 2016-11-26 NOTE — Addendum Note (Signed)
Addended by: Kateri Mc E on: 11/26/2016 07:58 AM   Modules accepted: Orders

## 2016-11-26 NOTE — Telephone Encounter (Signed)
Clonazepam 1 mg can be called in to continue 1 tab bid prn #60/0. I believe she cancelled her last FU appt, she needs to re-schedule. I saw her recently for an acute visit.  Thanks, BJ

## 2016-11-26 NOTE — Telephone Encounter (Signed)
Rx phoned in with message for patient to reschedule her follow up appt.

## 2016-11-27 DIAGNOSIS — F331 Major depressive disorder, recurrent, moderate: Secondary | ICD-10-CM | POA: Diagnosis not present

## 2016-12-04 ENCOUNTER — Emergency Department (HOSPITAL_COMMUNITY): Payer: Medicare Other

## 2016-12-04 ENCOUNTER — Encounter (HOSPITAL_COMMUNITY): Payer: Self-pay | Admitting: Emergency Medicine

## 2016-12-04 ENCOUNTER — Emergency Department (HOSPITAL_COMMUNITY)
Admission: EM | Admit: 2016-12-04 | Discharge: 2016-12-04 | Disposition: A | Discharge status: Home / Self Care | Payer: Medicare Other | Attending: Emergency Medicine | Admitting: Emergency Medicine

## 2016-12-04 DIAGNOSIS — R109 Unspecified abdominal pain: Secondary | ICD-10-CM | POA: Diagnosis not present

## 2016-12-04 DIAGNOSIS — Z79899 Other long term (current) drug therapy: Secondary | ICD-10-CM | POA: Insufficient documentation

## 2016-12-04 DIAGNOSIS — I10 Essential (primary) hypertension: Secondary | ICD-10-CM | POA: Diagnosis not present

## 2016-12-04 DIAGNOSIS — E119 Type 2 diabetes mellitus without complications: Secondary | ICD-10-CM | POA: Diagnosis not present

## 2016-12-04 LAB — URINALYSIS, ROUTINE W REFLEX MICROSCOPIC
Bacteria, UA: NONE SEEN
Bilirubin Urine: NEGATIVE
Glucose, UA: >=500 mg/dL — AB
Hgb urine dipstick: NEGATIVE
Ketones, ur: NEGATIVE mg/dL
Leukocytes, UA: NEGATIVE
Nitrite: NEGATIVE
Protein, ur: NEGATIVE mg/dL
Specific Gravity, Urine: 1.026 (ref 1.005–1.030)
Squamous Epithelial / LPF: NONE SEEN
pH: 6 (ref 5.0–8.0)

## 2016-12-04 LAB — BASIC METABOLIC PANEL
Anion gap: 10 (ref 5–15)
BUN: 10 mg/dL (ref 6–20)
CO2: 26 mmol/L (ref 22–32)
Calcium: 9.5 mg/dL (ref 8.9–10.3)
Chloride: 104 mmol/L (ref 101–111)
Creatinine, Ser: 0.77 mg/dL (ref 0.44–1.00)
GFR calc Af Amer: >60 mL/min (ref >60)
GFR calc non Af Amer: >60 mL/min (ref >60)
Glucose, Bld: 142 mg/dL — ABNORMAL HIGH (ref 65–99)
Potassium: 3.2 mmol/L — ABNORMAL LOW (ref 3.5–5.1)
Sodium: 140 mmol/L (ref 135–145)

## 2016-12-04 LAB — CBC
HCT: 42.8 % (ref 36.0–46.0)
Hemoglobin: 15.2 g/dL — ABNORMAL HIGH (ref 12.0–15.0)
MCH: 32.8 pg (ref 26.0–34.0)
MCHC: 35.5 g/dL (ref 30.0–36.0)
MCV: 92.4 fL (ref 78.0–100.0)
Platelets: 216 10*3/uL (ref 150–400)
RBC: 4.63 MIL/uL (ref 3.87–5.11)
RDW: 11.9 % (ref 11.5–15.5)
WBC: 5.5 10*3/uL (ref 4.0–10.5)

## 2016-12-04 MED ORDER — SENNOSIDES-DOCUSATE SODIUM 8.6-50 MG PO TABS: 2.0000 | ORAL_TABLET | Freq: Every day | ORAL | 0 refills | Status: AC

## 2016-12-04 MED ORDER — FENTANYL CITRATE (PF) 100 MCG/2ML IJ SOLN
25.0000 ug | Freq: Once | INTRAMUSCULAR | Status: AC
  Administered 2016-12-04: 25 ug via INTRAVENOUS
  Filled 2016-12-04: qty 2

## 2016-12-04 MED ORDER — KETOROLAC TROMETHAMINE 30 MG/ML IJ SOLN
15.0000 mg | Freq: Once | INTRAMUSCULAR | Status: AC
  Administered 2016-12-04: 15 mg via INTRAVENOUS
  Filled 2016-12-04: qty 1

## 2016-12-04 MED ORDER — HYDROCODONE-ACETAMINOPHEN 5-325 MG PO TABS
ORAL_TABLET | ORAL | Status: AC
  Filled 2016-12-04: qty 1

## 2016-12-04 MED ORDER — HYDROCODONE-ACETAMINOPHEN 5-325 MG PO TABS
1.0000 | ORAL_TABLET | Freq: Once | ORAL | Status: AC
  Administered 2016-12-04: 1 via ORAL

## 2016-12-04 MED ORDER — SODIUM CHLORIDE 0.9 % IV BOLUS (SEPSIS)
1000.0000 mL | Freq: Once | INTRAVENOUS | Status: AC
  Administered 2016-12-04: 1000 mL via INTRAVENOUS

## 2016-12-04 NOTE — Discharge Instructions (Signed)
As discussed, your evaluation today has been largely reassuring.  But, it is important that you monitor your condition carefully, and do not hesitate to return to the ED if you develop new, or concerning changes in your condition. ? ?Otherwise, please follow-up with your physician for appropriate ongoing care. ? ?

## 2016-12-04 NOTE — ED Triage Notes (Signed)
Pt c/o right sided flank pain x 4 days. Pt reports urinary frequency, and urine is darker than usual. No visible blood in urine. Denies injury/trauma.

## 2016-12-04 NOTE — ED Notes (Signed)
Pt. Requesting something for pain. Pt. Laying across the seats in th lobby.

## 2016-12-04 NOTE — ED Provider Notes (Signed)
[facility] DEPT Provider Note   CSN: [id number] Arrival date & time: 12/04/16  1446     History   Chief Complaint Chief Complaint  Patient presents with  . Flank Pain    HPI [name] is a 66 y.o. female.  HPI  Patient presents with concern of flank pain. Pain is right sided, initially was just superior to the SI joint, but over the past 4 days has become superior, sore, sharp, worse with any attempted motion. No precipitant, and a fall, no trauma, no recent surgery. Since onset no relief in spite of taking hydrocodone. No associated abdominal pain, no urinary complaints. Pain occasionally radiates down the posterior lateral aspect of the right hip.   Past Medical History:  Diagnosis Date  . Anxiety   . Arthritis   . Diabetes mellitus without complication ([place])   . Hyperlipidemia   . Hypertension   . PTSD (post-traumatic stress disorder)     Patient Active Problem List   Diagnosis Date Noted  . Arthritis of left acromioclavicular joint 08/13/2016  . Left shoulder pain 05/12/2016  . Cervical spondylosis without myelopathy 05/12/2016  . Rotator cuff syndrome, left 05/12/2016  . Restless leg syndrome 05/12/2016  . Primary osteoarthritis, left hand 05/12/2016  . MELENA 10/18/2008  . GASTROPARESIS 05/30/2008  . DM (diabetes mellitus), type 2 ([place]) 05/04/2008  . HYPERCHOLESTEROLEMIA 05/04/2008  . HYPOKALEMIA 05/04/2008  . MORBID OBESITY 05/04/2008  . ANXIETY DEPRESSION 05/04/2008  . GERD 05/04/2008  . Constipation 05/04/2008  . HEMATEMESIS 05/04/2008  . Hypertension, essential, benign 05/04/2008    Past Surgical History:  Procedure Laterality Date  . ABDOMINAL HYSTERECTOMY    . ANKLE SURGERY    . BACK SURGERY    . CHOLECYSTECTOMY    . NOSE SURGERY     x 2   . ROTATOR CUFF REPAIR      OB History    No data available       Home Medications    Prior to Admission medications   Medication Sig Start Date End Date Taking? Authorizing  Provider  clonazePAM (KLONOPIN) 1 MG tablet TAKE ONE TABLET BY MOUTH TWICE A DAY AS NEEDED FOR ANXIETY 11/26/16   Martinique, Betty G, MD  dapagliflozin propanediol (FARXIGA) 10 MG TABS tablet Take 10 mg by mouth daily. 11/24/16   Martinique, Betty G, MD  diclofenac (VOLTAREN) 75 MG EC tablet Take 1 tablet (75 mg total) by mouth 2 (two) times daily with a meal. 08/13/16   Meredith Staggers, MD  diclofenac sodium (VOLTAREN) 1 % GEL Apply topically to affected area qid 03/12/16   Gerda Diss, DO  DULoxetine (CYMBALTA) 60 MG capsule Take 1 capsule (60 mg total) by mouth daily. 08/17/16   Martinique, Betty G, MD  gabapentin (NEURONTIN) 400 MG capsule Take 800 mg by mouth at bedtime.  03/07/16   [provider]  hydrochlorothiazide (HYDRODIURIL) 25 MG tablet Take 1 tablet (25 mg total) by mouth daily. 10/21/16   Martinique, Betty G, MD  HYDROcodone-acetaminophen (NORCO/VICODIN) 5-325 MG tablet Take 1 tablet by mouth every 8 (eight) hours as needed for moderate pain. 11/17/16   Bayard Hugger, NP  meclizine (ANTIVERT) 25 MG tablet Take 1 tablet (25 mg total) by mouth 3 (three) times daily as needed for dizziness. 11/19/16   Martinique, Betty G, MD  omeprazole (PRILOSEC) 40 MG capsule Take 1 capsule (40 mg total) by mouth daily. 11/18/16   Mauri Pole, MD  ranitidine (ZANTAC) 150 MG tablet Take  1 tablet (150 mg total) by mouth at bedtime. 11/18/16   Mauri Pole, MD  rosuvastatin (CRESTOR) 10 MG tablet Take 1 tablet by mouth daily.  08/07/15   [provider]  valACYclovir (VALTREX) 500 MG tablet Take 500 mg by mouth daily. 05/12/16   [provider]    Family History Family History  Problem Relation Age of Onset  . Diabetes Mother   . Lung cancer Father   . Esophageal cancer Brother     Social History Social History  Substance Use Topics  . Smoking status: Never Smoker  . Smokeless tobacco: Never Used  . Alcohol use No     Allergies   Penicillins   Review of  Systems Review of Systems  Constitutional:       Per HPI, otherwise negative  HENT:       Per HPI, otherwise negative  Respiratory:       Per HPI, otherwise negative  Cardiovascular:       Per HPI, otherwise negative  Gastrointestinal: Negative for vomiting.  Endocrine:       Negative aside from HPI  Genitourinary:       Neg aside from HPI   Musculoskeletal:       Per HPI, otherwise negative  Skin: Negative.   Neurological: Negative for syncope and weakness.     Physical Exam Updated Vital Signs BP (!) 146/98 (BP Location: Right Arm)   Pulse 76   Temp 98.2 F (36.8 C) (Oral)   Resp 16   Ht 5\' 8"  (1.727 m)   Wt 82.6 kg (182 lb)   SpO2 100%   BMI 27.67 kg/m   Physical Exam  Constitutional: She is oriented to person, place, and time. She appears well-developed and well-nourished. No distress.  HENT:  Head: Normocephalic and atraumatic.  Eyes: Conjunctivae and EOM are normal.  Cardiovascular: Normal rate and regular rhythm.   Pulmonary/Chest: Effort normal and breath sounds normal. No stridor. No respiratory distress.  Abdominal: There is no tenderness. There is no guarding.  No appreciable deformity  Musculoskeletal: She exhibits no edema.  Neurological: She is alert and oriented to person, place, and time. No cranial nerve deficit.  Skin: Skin is warm and dry.  Psychiatric: She has a normal mood and affect.  Nursing note and vitals reviewed.    ED Treatments / Results  Labs (all labs ordered are listed, but only abnormal results are displayed) Labs Reviewed  URINALYSIS, ROUTINE W REFLEX MICROSCOPIC - Abnormal; Notable for the following:       Result Value   Glucose, UA >=500 (*)    All other components within normal limits  BASIC METABOLIC PANEL - Abnormal; Notable for the following:    Potassium 3.2 (*)    Glucose, Bld 142 (*)    All other components within normal limits  CBC - Abnormal; Notable for the following:    Hemoglobin 15.2 (*)    All other  components within normal limits    Radiology Ct Renal Stone Study  Result Date: 12/04/2016 CLINICAL DATA:  Flank pain.  Stone disease suspected. EXAM: CT ABDOMEN AND PELVIS WITHOUT CONTRAST TECHNIQUE: Multidetector CT imaging of the abdomen and pelvis was performed following the standard protocol without IV contrast. COMPARISON:  May 31, 2016 FINDINGS: Lower chest: No acute abnormality. Hepatobiliary: No focal liver abnormality is seen. Status post cholecystectomy. No biliary dilatation. Pancreas: Low-attenuation in the pancreatic head and uncinate process is similar in the interval, likely focal fatty deposition, not  much changed since 2010. No solid mass identified. Spleen: Normal in size without focal abnormality. Adrenals/Urinary Tract: The peripherally calcified mass in the left adrenal gland is stable. The right adrenal gland is normal. No renal stones, masses, hydronephrosis, or perinephric stranding. No ureterectasis or ureteral stones. The bladder is normal. Stomach/Bowel: The stomach and small bowel are normal. Moderate fecal loading is seen in the colon. The colon is otherwise normal. The appendix is not well seen but there is no secondary evidence of appendicitis. Vascular/Lymphatic: Atherosclerotic changes are seen in the non aneurysmal aorta, iliac vessels, and femoral vessels. No adenopathy identified on today's study. Reproductive: Status post hysterectomy. No adnexal masses. Other: No free air or free fluid. Musculoskeletal: No acute or significant osseous findings. IMPRESSION: 1. No cause for acute right flank pain identified. 2. Moderate fecal loading in the colon. 3. Atherosclerotic changes in the non aneurysmal aorta, iliac vessels, and femoral vessels. 4. No other acute abnormalities. Aortic Atherosclerosis (ICD10-I70.0). Electronically Signed   By: Dorise Bullion III M.D   On: 12/04/2016 21:11    Procedures Procedures (including critical care time)  Medications Ordered in  ED Medications  HYDROcodone-acetaminophen (NORCO/VICODIN) 5-325 MG per tablet (not administered)  HYDROcodone-acetaminophen (NORCO/VICODIN) 5-325 MG per tablet 1 tablet (1 tablet Oral Given 12/04/16 1746)  sodium chloride 0.9 % bolus 1,000 mL (0 mLs Intravenous Stopped 12/04/16 2250)  ketorolac (TORADOL) 30 MG/ML injection 15 mg (15 mg Intravenous Given 12/04/16 2110)  fentaNYL (SUBLIMAZE) injection 25 mcg (25 mcg Intravenous Given 12/04/16 2110)     Initial Impression / Assessment and Plan / ED Course  I have reviewed the triage vital signs and the nursing notes.  Pertinent labs & imaging results that were available during my care of the patient were reviewed by me and considered in my medical decision making (see chart for details).  Update: I reviewed the CT imaging with the patient and her husband specifically we discussed reviewed the images of the colon, which has a large stool burden, particularly in the ascending colon. Patient now notes that she has had issues with bowel movements and constipation for some time, has not had success in spite of using laxatives per Given his endorsement, and the CT findings, and physical exam findings, with otherwise reassuring results, no evidence for peritonitis, bacteremia, sepsis or other acute new abdominal processes, the patient was started on a stool softener regimen, will follow up with GI.  Final Clinical Impressions(s) / ED Diagnoses   Final diagnoses:  Right flank pain    New Prescriptions New Prescriptions   No medications on file     Carmin Muskrat, MD 12/05/16 0010

## 2016-12-09 ENCOUNTER — Telehealth: Payer: Self-pay | Admitting: Gastroenterology

## 2016-12-09 ENCOUNTER — Encounter: Payer: Self-pay | Admitting: Family Medicine

## 2016-12-09 DIAGNOSIS — Z1231 Encounter for screening mammogram for malignant neoplasm of breast: Secondary | ICD-10-CM | POA: Diagnosis not present

## 2016-12-09 NOTE — Telephone Encounter (Signed)
Spoke with the patient. She was seen in the ER. Told there the "stool in my large intestine" could be causing the abdominal discomfort she has been experiencing. She is taking daily [place] for the past several days, but cannot feel any improvement.  She is willing to try a bowel purge. Chooses to use Magnesium Citrate because she has this at home. Explained to the patient the Mag Citrate should liquify her stool and she will not pass solid stool.  Patient will call back if she does not improve. She has a procedure later this month.

## 2016-12-16 ENCOUNTER — Encounter: Payer: Self-pay | Admitting: Cardiovascular Disease

## 2016-12-16 ENCOUNTER — Ambulatory Visit (INDEPENDENT_AMBULATORY_CARE_PROVIDER_SITE_OTHER): Payer: Medicare Other | Admitting: Cardiovascular Disease

## 2016-12-16 VITALS — BP 122/80 | HR 84 | Ht 68.0 in | Wt 191.4 lb

## 2016-12-16 DIAGNOSIS — R0789 Other chest pain: Secondary | ICD-10-CM | POA: Diagnosis not present

## 2016-12-16 DIAGNOSIS — I10 Essential (primary) hypertension: Secondary | ICD-10-CM

## 2016-12-16 DIAGNOSIS — R911 Solitary pulmonary nodule: Secondary | ICD-10-CM

## 2016-12-16 DIAGNOSIS — E0859 Diabetes mellitus due to underlying condition with other circulatory complications: Secondary | ICD-10-CM

## 2016-12-16 DIAGNOSIS — R0600 Dyspnea, unspecified: Secondary | ICD-10-CM

## 2016-12-16 DIAGNOSIS — Z8249 Family history of ischemic heart disease and other diseases of the circulatory system: Secondary | ICD-10-CM

## 2016-12-16 DIAGNOSIS — R079 Chest pain, unspecified: Secondary | ICD-10-CM | POA: Diagnosis not present

## 2016-12-16 DIAGNOSIS — R0609 Other forms of dyspnea: Secondary | ICD-10-CM | POA: Diagnosis not present

## 2016-12-16 DIAGNOSIS — I700 Atherosclerosis of aorta: Secondary | ICD-10-CM

## 2016-12-16 MED ORDER — POTASSIUM CHLORIDE CRYS ER 20 MEQ PO TBCR: 20.0000 meq | EXTENDED_RELEASE_TABLET | Freq: Every day | ORAL | 3 refills | Status: DC

## 2016-12-16 NOTE — Patient Instructions (Signed)
Medication Instructions:  START Kdur (Potassium supplement) 20 meq once daily   Labwork: TODAY - cholesterol, basic metabolic panel, TSH, liver panel  Your physician recommends that you return for lab work in: 3 weeks for basic metabolic panel   Testing/Procedures: Your physician has requested that you have an echocardiogram. Echocardiography is a painless test that uses sound waves to create images of your heart. It provides your doctor with information about the size and shape of your heart and how well your heart's chambers and valves are working. This procedure takes approximately one hour. There are no restrictions for this procedure.  Your physician has requested that you have a Coronary CT to evaluate your dyspnea   Follow-Up: Your physician recommends that you schedule a follow-up appointment in: 3 months with Dr. Acie Fredrickson   If you need a refill on your cardiac medications before your next appointment, please call your pharmacy.   Thank you for choosing [facility]! Christen Bame, RN [phone]

## 2016-12-16 NOTE — Progress Notes (Signed)
Cardiology Office Note:    Date:  12/16/2016   ID:  [name], DOB 1951-04-06, MRN [id number]  PCP:  Martinique, Betty G, MD  Cardiologist:  Mertie Moores, MD    Referring MD: Martinique, Betty G, MD   Chief Complaint  Patient presents with  . New Patient (Initial Visit)    Dyspnea on Exertion   Problem List 1. Dyspnea on Exertion 2. Hyperlipidemia 3. Dizziness 4. Diabetes type II  History of Present Illness:    [name] is a 66 y.o. female with a hx of DOE She was seen By her primary medical doctor several weeks ago.  Has had shortness of breath for the past 5-6 months. She has had some tightness in her chest on occasion. She thinks the tightness might be related to anxiety. Tightness, not necessarily with exertion. , not assoc with taking a deep breath .  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  Has had head aches for 2 weeks.   No recent lipid profile   Past Medical History:  Diagnosis Date  . Anxiety   . Arthritis   . Diabetes mellitus without complication ([place])   . Hyperlipidemia   . Hypertension   . PTSD (post-traumatic stress disorder)     Past Surgical History:  Procedure Laterality Date  . ABDOMINAL HYSTERECTOMY    . ANKLE SURGERY    . BACK SURGERY    . CHOLECYSTECTOMY    . NOSE SURGERY     x 2   . ROTATOR CUFF REPAIR      Current Medications: Current Meds  Medication Sig  . Biotin 10000 MCG TABS Take 10,000 mcg by mouth 2 (two) times a week.  . clonazePAM (KLONOPIN) 1 MG tablet Take 1 mg by mouth 2 (two) times daily as needed for anxiety.  . dapagliflozin propanediol (FARXIGA) 10 MG TABS tablet Take 10 mg by mouth daily.  . diclofenac sodium (VOLTAREN) 1 % GEL Apply 2-4 g topically 4 (four) times daily.  . DULoxetine (CYMBALTA) 30 MG capsule Take 60 mg by mouth every morning.  . gabapentin (NEURONTIN) 400 MG capsule Take 800 mg by mouth at bedtime.   . hydrochlorothiazide (HYDRODIURIL) 25 MG tablet Take 1 tablet (25  mg total) by mouth daily.  Marland Kitchen HYDROcodone-acetaminophen (NORCO/VICODIN) 5-325 MG tablet Take 1 tablet by mouth every 8 (eight) hours as needed for moderate pain.  . Melatonin 10 MG TABS Take 10 mg by mouth at bedtime.  . multivitamin-lutein (OCUVITE-LUTEIN) CAPS capsule Take 1 capsule by mouth daily.  Marland Kitchen omeprazole (PRILOSEC) 40 MG capsule Take 1 capsule (40 mg total) by mouth daily.  . rosuvastatin (CRESTOR) 10 MG tablet Take 10 mg by mouth daily.      Allergies:   Dilaudid [hydromorphone]; Morphine and related; and Penicillins   Social History   Social History  . Marital status: Divorced    Spouse name: N/A  . Number of children: N/A  . Years of education: N/A   Social History Main Topics  . Smoking status: Never Smoker  . Smokeless tobacco: Never Used  . Alcohol use No  . Drug use: No  . Sexual activity: Not Asked   Other Topics Concern  . None   Social History Narrative  . None     Family History: The patient's family history includes Diabetes in her mother; Esophageal cancer in her brother; Lung cancer in her father. ROS:   Please see the history of present illness.  All other systems reviewed and are negative.  EKGs/Labs/Other Studies Reviewed:    The following studies were reviewed today:   EKG:  EKG is  ordered today.  The ekg ordered today demonstrates   NSR at 85.   LAD , poor R wave progression .   Recent Labs: 05/31/2016: ALT 19 12/04/2016: BUN 10; Creatinine, Ser 0.77; Hemoglobin 15.2; Platelets 216; Potassium 3.2; Sodium 140  Recent Lipid Panel No results found for: CHOL, TRIG, HDL, CHOLHDL, VLDL, LDLCALC, LDLDIRECT  Physical Exam:    VS:  BP 122/80   Pulse 84   Ht 5\' 8"  (1.727 m)   Wt 191 lb 6.4 oz (86.8 kg)   BMI 29.10 kg/m     Wt Readings from Last 3 Encounters:  12/16/16 191 lb 6.4 oz (86.8 kg)  12/04/16 182 lb (82.6 kg)  11/19/16 186 lb 9 oz (84.6 kg)     GEN:  Well nourished, well developed in no acute distress HEENT: Normal NECK:  No JVD; No carotid bruits LYMPHATICS: No lymphadenopathy CARDIAC: RR, no murmurs, rubs, gallops RESPIRATORY:  Clear to auscultation without rales, wheezing or rhonchi  ABDOMEN: Soft, non-tender, non-distended MUSCULOSKELETAL:  No edema; No deformity .  Distal foot pulses are weak.  SKIN: Warm and dry NEUROLOGIC:  Alert and oriented x 3 PSYCHIATRIC:  Normal affect   ASSESSMENT:    No diagnosis found. PLAN:    In order of problems listed above:  1. Shortness breath with exertion: She presents with shortness of breath with exertion. She has a family history of cardiac disease and also has a history of diabetes and hypertension. Her symptoms might be related to exhaustion and stress of taking care of her invalid mom but she may also have a cardiac expiration. We'll get an echocardiogram for further evaluation. I would also like to get a coronary CT angiogram to evaluate her for the possibility of coronary artery disease. We'll check labs today including a complete metabolic profile, TSH, lipid profile.  We'll see her in 3 months for follow-up visit.   Medication Adjustments/Labs and Tests Ordered: Current medicines are reviewed at length with the patient today.  Concerns regarding medicines are outlined above.  No orders of the defined types were placed in this encounter.  No orders of the defined types were placed in this encounter.   Signed, Mertie Moores, MD  12/16/2016 2:35 PM    [place]

## 2016-12-17 ENCOUNTER — Encounter: Payer: Self-pay | Admitting: Cardiovascular Disease

## 2016-12-17 ENCOUNTER — Ambulatory Visit (INDEPENDENT_AMBULATORY_CARE_PROVIDER_SITE_OTHER): Payer: Medicare Other | Admitting: Orthopedic Surgery

## 2016-12-17 LAB — LIPID PANEL
Chol/HDL Ratio: 3.5 ratio (ref 0.0–4.4)
Cholesterol, Total: 183 mg/dL (ref 100–199)
HDL: 53 mg/dL (ref >39)
LDL Calculated: 84 mg/dL (ref 0–99)
Triglycerides: 230 mg/dL — ABNORMAL HIGH (ref 0–149)
VLDL Cholesterol Cal: 46 mg/dL — ABNORMAL HIGH (ref 5–40)

## 2016-12-17 LAB — BASIC METABOLIC PANEL
BUN/Creatinine Ratio: 13 (ref 12–28)
BUN: 12 mg/dL (ref 8–27)
CO2: 23 mmol/L (ref 20–29)
Calcium: 9.2 mg/dL (ref 8.7–10.3)
Chloride: 100 mmol/L (ref 96–106)
Creatinine, Ser: 0.9 mg/dL (ref 0.57–1.00)
GFR calc Af Amer: 78 mL/min/{1.73_m2} (ref >59)
GFR calc non Af Amer: 68 mL/min/{1.73_m2} (ref >59)
Glucose: 208 mg/dL — ABNORMAL HIGH (ref 65–99)
Potassium: 4.4 mmol/L (ref 3.5–5.2)
Sodium: 142 mmol/L (ref 134–144)

## 2016-12-17 LAB — TSH: TSH: 0.942 u[IU]/mL (ref 0.450–4.500)

## 2016-12-17 LAB — HEPATIC FUNCTION PANEL
ALT: 18 IU/L (ref 0–32)
AST: 20 IU/L (ref 0–40)
Albumin: 4.4 g/dL (ref 3.6–4.8)
Alkaline Phosphatase: 65 IU/L (ref 39–117)
Bilirubin Total: 0.4 mg/dL (ref 0.0–1.2)
Bilirubin, Direct: 0.11 mg/dL (ref 0.00–0.40)
Total Protein: 6.9 g/dL (ref 6.0–8.5)

## 2016-12-17 NOTE — Addendum Note (Signed)
Addended by: Emmaline Life on: 12/17/2016 03:57 PM   Modules accepted: Orders

## 2016-12-22 ENCOUNTER — Emergency Department (HOSPITAL_COMMUNITY)
Admission: EM | Admit: 2016-12-22 | Discharge: 2016-12-22 | Discharge status: Against Medical Advice | Payer: Medicare Other | Attending: Emergency Medicine | Admitting: Emergency Medicine

## 2016-12-22 ENCOUNTER — Encounter (HOSPITAL_COMMUNITY): Payer: Self-pay | Admitting: Emergency Medicine

## 2016-12-22 ENCOUNTER — Encounter: Payer: Self-pay | Admitting: *Deleted

## 2016-12-22 DIAGNOSIS — R109 Unspecified abdominal pain: Secondary | ICD-10-CM | POA: Insufficient documentation

## 2016-12-22 DIAGNOSIS — M5489 Other dorsalgia: Secondary | ICD-10-CM | POA: Diagnosis not present

## 2016-12-22 DIAGNOSIS — Z79899 Other long term (current) drug therapy: Secondary | ICD-10-CM | POA: Diagnosis not present

## 2016-12-22 DIAGNOSIS — R159 Full incontinence of feces: Secondary | ICD-10-CM | POA: Diagnosis not present

## 2016-12-22 DIAGNOSIS — R140 Abdominal distension (gaseous): Secondary | ICD-10-CM | POA: Insufficient documentation

## 2016-12-22 DIAGNOSIS — E119 Type 2 diabetes mellitus without complications: Secondary | ICD-10-CM | POA: Insufficient documentation

## 2016-12-22 DIAGNOSIS — R202 Paresthesia of skin: Secondary | ICD-10-CM | POA: Diagnosis not present

## 2016-12-22 DIAGNOSIS — R1011 Right upper quadrant pain: Secondary | ICD-10-CM | POA: Diagnosis not present

## 2016-12-22 DIAGNOSIS — I10 Essential (primary) hypertension: Secondary | ICD-10-CM | POA: Insufficient documentation

## 2016-12-22 DIAGNOSIS — K5900 Constipation, unspecified: Secondary | ICD-10-CM | POA: Insufficient documentation

## 2016-12-22 LAB — COMPREHENSIVE METABOLIC PANEL
ALT: 23 U/L (ref 14–54)
AST: 22 U/L (ref 15–41)
Albumin: 4.7 g/dL (ref 3.5–5.0)
Alkaline Phosphatase: 67 U/L (ref 38–126)
Anion gap: 12 (ref 5–15)
BUN: 16 mg/dL (ref 6–20)
CO2: 27 mmol/L (ref 22–32)
Calcium: 10.1 mg/dL (ref 8.9–10.3)
Chloride: 102 mmol/L (ref 101–111)
Creatinine, Ser: 0.86 mg/dL (ref 0.44–1.00)
GFR calc Af Amer: >60 mL/min (ref >60)
GFR calc non Af Amer: >60 mL/min (ref >60)
Glucose, Bld: 123 mg/dL — ABNORMAL HIGH (ref 65–99)
Potassium: 3.5 mmol/L (ref 3.5–5.1)
Sodium: 141 mmol/L (ref 135–145)
Total Bilirubin: 1.1 mg/dL (ref 0.3–1.2)
Total Protein: 7.9 g/dL (ref 6.5–8.1)

## 2016-12-22 LAB — URINALYSIS, ROUTINE W REFLEX MICROSCOPIC
Bacteria, UA: NONE SEEN
Bilirubin Urine: NEGATIVE
Glucose, UA: >=500 mg/dL — AB
Hgb urine dipstick: NEGATIVE
Ketones, ur: NEGATIVE mg/dL
Leukocytes, UA: NEGATIVE
Nitrite: NEGATIVE
Protein, ur: NEGATIVE mg/dL
Specific Gravity, Urine: 1.026 (ref 1.005–1.030)
pH: 6 (ref 5.0–8.0)

## 2016-12-22 LAB — CBC WITH DIFFERENTIAL/PLATELET
Basophils Absolute: 0 10*3/uL (ref 0.0–0.1)
Basophils Relative: 0 %
Eosinophils Absolute: 0.1 10*3/uL (ref 0.0–0.7)
Eosinophils Relative: 1 %
HCT: 43.3 % (ref 36.0–46.0)
Hemoglobin: 15.6 g/dL — ABNORMAL HIGH (ref 12.0–15.0)
Lymphocytes Relative: 22 %
Lymphs Abs: 1.7 10*3/uL (ref 0.7–4.0)
MCH: 33.3 pg (ref 26.0–34.0)
MCHC: 36 g/dL (ref 30.0–36.0)
MCV: 92.3 fL (ref 78.0–100.0)
Monocytes Absolute: 1.1 10*3/uL — ABNORMAL HIGH (ref 0.1–1.0)
Monocytes Relative: 14 %
Neutro Abs: 4.9 10*3/uL (ref 1.7–7.7)
Neutrophils Relative %: 63 %
Platelets: 230 10*3/uL (ref 150–400)
RBC: 4.69 MIL/uL (ref 3.87–5.11)
RDW: 12.1 % (ref 11.5–15.5)
WBC: 7.8 10*3/uL (ref 4.0–10.5)

## 2016-12-22 LAB — LIPASE, BLOOD: Lipase: 31 U/L (ref 11–51)

## 2016-12-22 MED ORDER — METHOCARBAMOL 500 MG PO TABS
1000.0000 mg | ORAL_TABLET | Freq: Once | ORAL | Status: DC
  Filled 2016-12-22: qty 2

## 2016-12-22 MED ORDER — FENTANYL CITRATE (PF) 100 MCG/2ML IJ SOLN
50.0000 ug | Freq: Once | INTRAMUSCULAR | Status: DC
  Filled 2016-12-22: qty 2

## 2016-12-22 MED ORDER — POLYETHYLENE GLYCOL 3350 17 G PO PACK: 17.0000 g | PACK | Freq: Every day | ORAL | Status: DC

## 2016-12-22 NOTE — ED Notes (Signed)
Bed: WA06 Expected date:  Expected time:  Means of arrival:  Comments: Back pain

## 2016-12-22 NOTE — ED Notes (Signed)
Upon arrive the the room patients was very upset and had multiple complains so I called the charge nurse to speak with patient.  Patient was still cussing and fussing and she started to cry.  I tried to understand with she needed.

## 2016-12-22 NOTE — ED Notes (Signed)
Entered pt room to collect U/A order. Pt states "I peed on the bathroom floor, my legs are wet from it, I need to get it cleaned up". Pt gave unclear explanation/reason for urinating on floor. Pt provided no rinse cleanser, wash cloth and towel. Pt states "I don't like you, you won't help me, you don't care about anything and I want to leave from here and speak to the nurse." Pt advised room nurse and charge nurse will be advised of request. Di Kindle RN/Jennifer RN advised. [employer]

## 2016-12-22 NOTE — ED Provider Notes (Addendum)
[facility] DEPT Provider Note   CSN: [id number] Arrival date & time: 12/22/16  1934     History   Chief Complaint Chief Complaint  Patient presents with  . Back Pain    HPI [name] is a 66 y.o. female with history of diabetes, hypertension, anxiety, PTSD who presents with a several week history of right flank pain and constipation. Patient was evaluated 2 weeks ago and had a CT renal stone study which showed moderate fecal loading, but no other findings. Patient reports she has been taking magnesium citrate and enemas at home with only mild relief. She has been straining a lot and only had 2 pieces of stool. She has had some small amounts of incontinence of stool as well. She notes pain radiating to bilateral thighs. She states that her toes are tingling bilaterally. Patient reports she had an endoscopy scheduled for tomorrow, however she needs to be cleared by cardiology first so it cannot be done. Patient notes associated abdominal distention. She denies any fevers, chest pain, shortness of breath. Patient does note some right lower quadrant tenderness that has been persistent since onset. She knows that she was treated for urinary tract infection recently. She reports taking 3 Aleve today for her symptoms. She reports that she goes to the pain clinic and takes hydrocodone for L shoulder pain.   Back Pain   Associated symptoms include abdominal pain. Pertinent negatives include no chest pain, no fever, no headaches and no dysuria.    Past Medical History:  Diagnosis Date  . Anxiety   . Arthritis   . Diabetes mellitus without complication ([place])   . Hyperlipidemia   . Hypertension   . PTSD (post-traumatic stress disorder)     Patient Active Problem List   Diagnosis Date Noted  . DOE (dyspnea on exertion) 12/16/2016  . Family history of early CAD 12/16/2016  . Other chest pain 12/16/2016  . Arthritis of left acromioclavicular joint 08/13/2016  . Left shoulder pain  05/12/2016  . Cervical spondylosis without myelopathy 05/12/2016  . Rotator cuff syndrome, left 05/12/2016  . Restless leg syndrome 05/12/2016  . Primary osteoarthritis, left hand 05/12/2016  . MELENA 10/18/2008  . GASTROPARESIS 05/30/2008  . DM (diabetes mellitus), type 2 ([place]) 05/04/2008  . HYPERCHOLESTEROLEMIA 05/04/2008  . HYPOKALEMIA 05/04/2008  . MORBID OBESITY 05/04/2008  . ANXIETY DEPRESSION 05/04/2008  . GERD 05/04/2008  . Constipation 05/04/2008  . HEMATEMESIS 05/04/2008  . Essential hypertension 05/04/2008    Past Surgical History:  Procedure Laterality Date  . ABDOMINAL HYSTERECTOMY    . ANKLE SURGERY    . BACK SURGERY    . CHOLECYSTECTOMY    . NOSE SURGERY     x 2   . ROTATOR CUFF REPAIR      OB History    No data available       Home Medications    Prior to Admission medications   Medication Sig Start Date End Date Taking? Authorizing Provider  Biotin 10000 MCG TABS Take 10,000 mcg by mouth 2 (two) times a week.   Yes [provider]  clonazePAM (KLONOPIN) 1 MG tablet Take 1 mg by mouth 2 (two) times daily as needed for anxiety.   Yes [provider]  dapagliflozin propanediol (FARXIGA) 10 MG TABS tablet Take 10 mg by mouth daily. 11/24/16  Yes Martinique, Betty G, MD  diclofenac sodium (VOLTAREN) 1 % GEL Apply 2-4 g topically 4 (four) times daily.   Yes [provider]  DULoxetine (  CYMBALTA) 30 MG capsule Take 60 mg by mouth every morning. 11/18/16  Yes [provider]  gabapentin (NEURONTIN) 400 MG capsule Take 800 mg by mouth at bedtime.  03/07/16  Yes [provider]  hydrochlorothiazide (HYDRODIURIL) 25 MG tablet Take 1 tablet (25 mg total) by mouth daily. 10/21/16  Yes Martinique, Betty G, MD  HYDROcodone-acetaminophen (NORCO/VICODIN) 5-325 MG tablet Take 1 tablet by mouth every 8 (eight) hours as needed for moderate pain. 11/17/16  Yes Bayard Hugger, NP  Melatonin 10 MG TABS Take 10 mg by mouth at bedtime.   Yes  [provider]  multivitamin-lutein (OCUVITE-LUTEIN) CAPS capsule Take 1 capsule by mouth daily.   Yes [provider]  omeprazole (PRILOSEC) 40 MG capsule Take 1 capsule (40 mg total) by mouth daily. 11/18/16  Yes Nandigam, Venia Minks, MD  potassium chloride SA (K-DUR,KLOR-CON) 20 MEQ tablet Take 1 tablet (20 mEq total) by mouth daily. 12/16/16  Yes Nahser, Wonda Cheng, MD  rosuvastatin (CRESTOR) 10 MG tablet Take 10 mg by mouth daily.  08/07/15  Yes [provider]    Family History Family History  Problem Relation Age of Onset  . Diabetes Mother   . Lung cancer Father   . Esophageal cancer Brother     Social History Social History  Substance Use Topics  . Smoking status: Never Smoker  . Smokeless tobacco: Never Used  . Alcohol use No     Allergies   Dilaudid [hydromorphone]; Morphine and related; and Penicillins   Review of Systems Review of Systems  Constitutional: Negative for chills and fever.  HENT: Negative for facial swelling and sore throat.   Respiratory: Negative for shortness of breath.   Cardiovascular: Negative for chest pain.  Gastrointestinal: Positive for abdominal pain, blood in stool and constipation. Negative for nausea and vomiting.  Genitourinary: Positive for flank pain. Negative for dysuria.  Musculoskeletal: Positive for back pain.  Skin: Negative for rash and wound.  Neurological: Negative for headaches.  Psychiatric/Behavioral: The patient is not nervous/anxious.      Physical Exam Updated Vital Signs BP (!) 174/105 (BP Location: Right Arm)   Pulse 87   Temp 98.2 F (36.8 C) (Oral)   Resp 20   SpO2 100%   Physical Exam  Constitutional: She appears well-developed and well-nourished. No distress.  Patient crying throughout entire evaluation. Hysterical.  HENT:  Head: Normocephalic and atraumatic.  Mouth/Throat: Oropharynx is clear and moist. No oropharyngeal exudate.  Eyes: Pupils are equal, round, and reactive to  light. Conjunctivae are normal. Right eye exhibits no discharge. Left eye exhibits no discharge. No scleral icterus.  Neck: Normal range of motion. Neck supple. No thyromegaly present.  Cardiovascular: Normal rate, regular rhythm, normal heart sounds and intact distal pulses.  Exam reveals no gallop and no friction rub.   No murmur heard. Pulmonary/Chest: Effort normal and breath sounds normal. No stridor. No respiratory distress. She has no wheezes. She has no rales.  Abdominal: Soft. Bowel sounds are normal. She exhibits distension (mild). There is tenderness in the right lower quadrant and suprapubic area. There is CVA tenderness (R). There is no rebound and no guarding.  Genitourinary:  Genitourinary Comments: Tenderness in the rectum, no fecal impaction palpated  Musculoskeletal: She exhibits no edema.       Back:  No midline cervical, thoracic, or lumbar tenderness; localized around R flank/CVA Normal sensation to bilateral lower extremities, 5/5 strength bilaterally  Lymphadenopathy:    She has no cervical adenopathy.  Neurological:  She is alert. Coordination normal.  Skin: Skin is warm and dry. No rash noted. She is not diaphoretic. No pallor.  Psychiatric: She has a normal mood and affect.  Nursing note and vitals reviewed.    ED Treatments / Results  Labs (all labs ordered are listed, but only abnormal results are displayed) Labs Reviewed  COMPREHENSIVE METABOLIC PANEL - Abnormal; Notable for the following:       Result Value   Glucose, Bld 123 (*)    All other components within normal limits  CBC WITH DIFFERENTIAL/PLATELET - Abnormal; Notable for the following:    Hemoglobin 15.6 (*)    Monocytes Absolute 1.1 (*)    All other components within normal limits  URINALYSIS, ROUTINE W REFLEX MICROSCOPIC - Abnormal; Notable for the following:    Glucose, UA >=500 (*)    Squamous Epithelial / LPF 0-5 (*)    All other components within normal limits  LIPASE, BLOOD    EKG   EKG Interpretation None       Radiology No results found.  Procedures Procedures (including critical care time)  Medications Ordered in ED Medications  fentaNYL (SUBLIMAZE) injection 50 mcg (50 mcg Intravenous Refused 12/22/16 2208)  methocarbamol (ROBAXIN) tablet 1,000 mg (1,000 mg Oral Refused 12/22/16 2208)  polyethylene glycol (MIRALAX / GLYCOLAX) packet 17 g (not administered)     Initial Impression / Assessment and Plan / ED Course  I have reviewed the triage vital signs and the nursing notes.  Pertinent labs & imaging results that were available during my care of the patient were reviewed by me and considered in my medical decision making (see chart for details).     Patient with continued back pain after evaluation on 12/04/2016. Labs are unremarkable. She persists with constipation, however has had some, small movement of her bowels. No fecal impaction on my exam. I initially ordered pain medication and Robaxin to help patient's apparently severe back pain, however she declined and requested help with her constipation. I ordered MiraLAX, however when nurse, Di Kindle, went to give the medication, patient stated she was leaving. This was reported to me by nurse, Di Kindle. I was not able to speak with patient prior to her elopement from the department. I discussed patient case with Dr. Leonette Monarch who guided the patient's management and agrees with plan.   Final Clinical Impressions(s) / ED Diagnoses   Final diagnoses:  Right flank pain  Constipation, unspecified constipation type    New Prescriptions Discharge Medication List as of 12/22/2016 10:21 PM       Frederica Kuster, PA-C 12/23/16 0010    Fatima Blank, MD 12/23/16 0026    Frederica Kuster, PA-C 12/23/16 1146    Cardama, Grayce Sessions, MD 12/24/16 [phone]

## 2016-12-22 NOTE — ED Notes (Addendum)
This RN was documenting at desk and heard this patient berating staff and proper de-escalation tactics being used by phlebotomist. Pt refused to cooperate and speak respectfully.

## 2016-12-22 NOTE — Telephone Encounter (Signed)
Created in error

## 2016-12-22 NOTE — ED Notes (Addendum)
Per MD order U/A sample needed. Pt provided specimen cup and personal wipe, provided socks, assisted OOB to stand up, escorted to restroom. Pt returned to room independently after restroom use. [employer]

## 2016-12-22 NOTE — ED Triage Notes (Addendum)
Pt from [facility] via EMS with c/o back pain. Pt changed her complaints multiple times during transport per EMS and has changed her complaint during triage. Pt denies emesis or diarrhea, but currently states she has not had a normal bm in 2 weeks. Pt states she has GI specialist with endoscopy scheduled. Pt initially reported abdominal pain and flank pain, but currently denies that and has c/o back pain.

## 2016-12-22 NOTE — ED Notes (Signed)
Went to introduce myself to patient, apologize for the long wait. She automatically had an attitude toward the staff, refused medication, reported she was "full of shit" from last week and wanted the "shit out" of her. Also, stated she didn't come for pain medication, she requested an enema. Informed Alex, PA of this information and she changed her order. Anderson Malta Ox RN, Camera operator, had already been to speak with patient and was aware of patients condition and actions.

## 2016-12-23 ENCOUNTER — Telehealth: Payer: Self-pay | Admitting: Gastroenterology

## 2016-12-23 ENCOUNTER — Encounter: Payer: Medicare Other | Admitting: Gastroenterology

## 2016-12-23 MED ORDER — LINACLOTIDE 290 MCG PO CAPS: 290.0000 ug | ORAL_CAPSULE | Freq: Every day | ORAL | 6 refills | Status: DC

## 2016-12-23 NOTE — Telephone Encounter (Signed)
Called the patient back. She is very upset and frequently tearful. Talks about her ER visit, her Echo that is scheduled, her off and on back pain and her right sided pain that wraps into her back. She is passing soft to liquid stool. Takes Senna everyday. Her "stomach hurts" and she is not hungry.  Redirected the patient several times to discuss her concerns about her bowel movements. She agrees to stop using any Magnesium Citrate, enemas or any stimulate laxatives. She is taking Linzess. She takes hydrocodone daily for shoulder pain. She will try Miralax daily and continue Linzess as ordered. She will call with an update of her progress.

## 2016-12-23 NOTE — Telephone Encounter (Signed)
Linzess 290 sent to patients pharmacy

## 2016-12-24 ENCOUNTER — Telehealth: Payer: Self-pay | Admitting: Family Medicine

## 2016-12-24 ENCOUNTER — Ambulatory Visit (INDEPENDENT_AMBULATORY_CARE_PROVIDER_SITE_OTHER): Payer: Medicare Other | Admitting: Family Medicine

## 2016-12-24 ENCOUNTER — Encounter: Payer: Self-pay | Admitting: Family Medicine

## 2016-12-24 VITALS — BP 130/90 | HR 78 | Temp 97.5°F | Resp 12 | Ht 68.0 in

## 2016-12-24 DIAGNOSIS — H60502 Unspecified acute noninfective otitis externa, left ear: Secondary | ICD-10-CM | POA: Diagnosis not present

## 2016-12-24 DIAGNOSIS — H6692 Otitis media, unspecified, left ear: Secondary | ICD-10-CM

## 2016-12-24 MED ORDER — KETOROLAC TROMETHAMINE 60 MG/2ML IM SOLN
60.0000 mg | Freq: Once | INTRAMUSCULAR | Status: AC
  Administered 2016-12-24: 60 mg via INTRAMUSCULAR

## 2016-12-24 MED ORDER — CEFDINIR 300 MG PO CAPS: 300.0000 mg | ORAL_CAPSULE | Freq: Two times a day (BID) | ORAL | 0 refills | Status: DC

## 2016-12-24 MED ORDER — CIPROFLOXACIN-DEXAMETHASONE 0.3-0.1 % OT SUSP: 4.0000 [drp] | Freq: Two times a day (BID) | OTIC | 0 refills | Status: DC

## 2016-12-24 MED ORDER — NEOMYCIN-POLYMYXIN-HC 3.5-10000-1 OT SOLN: 3.0000 [drp] | Freq: Three times a day (TID) | OTIC | 0 refills | Status: DC

## 2016-12-24 NOTE — Telephone Encounter (Signed)
OK to change rx?

## 2016-12-24 NOTE — Telephone Encounter (Addendum)
Pt's insurance will not pay for the Rx  ciprofloxacin-dexamethasone (CIPRODEX) OTIC suspension  But they will pay for  Ofloxacin   Pharmacy is requesting this to be sent to them [facility] [address] [place]  Pt in a lot of pain. Needs asap please!!

## 2016-12-24 NOTE — Patient Instructions (Signed)
A few things to remember from today's visit:   Left otitis media, unspecified otitis media type - Plan: cefdinir (OMNICEF) 300 MG capsule  Acute otitis externa of left ear, unspecified type - Plan: ciprofloxacin-dexamethasone (CIPRODEX) OTIC suspension   Earache, Adult An earache, or ear pain, can be caused by many things, including:  An infection.  Ear wax buildup.  Ear pressure.  Something in the ear that should not be there (foreign body).  A sore throat.  Tooth problems.  Jaw problems.  Treatment of the earache will depend on the cause. If the cause is not clear or cannot be determined, you may need to watch your symptoms until your earache goes away or until a cause is found. Follow these instructions at home: Pay attention to any changes in your symptoms. Take these actions to help with your pain:  Take or apply over-the-counter and prescription medicines only as told by your health care provider.  If you were prescribed an antibiotic medicine, use it as told by your health care provider. Do not stop using the antibiotic even if you start to feel better.  Do not put anything in your ear other than medicine that is prescribed by your health care provider.  If directed, apply heat to the affected area as often as told by your health care provider. Use the heat source that your health care provider recommends, such as a moist heat pack or a heating pad. ? Place a towel between your skin and the heat source. ? Leave the heat on for 20-30 minutes. ? Remove the heat if your skin turns bright red. This is especially important if you are unable to feel pain, heat, or cold. You may have a greater risk of getting burned.  If directed, put ice on the ear: ? Put ice in a plastic bag. ? Place a towel between your skin and the bag. ? Leave the ice on for 20 minutes, 2-3 times a day.  Try resting in an upright position instead of lying down. This may help to reduce pressure in  your ear and relieve pain.  Chew gum if it helps to relieve your ear pain.  Treat any allergies as told by your health care provider.  Keep all follow-up visits as told by your health care provider. This is important.  Contact a health care provider if:  Your pain does not improve within 2 days.  Your earache gets worse.  You have new symptoms.  You have a fever. Get help right away if:  You have a severe headache.  You have a stiff neck.  You have trouble swallowing.  You have redness or swelling behind your ear.  You have fluid or blood coming from your ear.  You have hearing loss.  You feel dizzy. This information is not intended to replace advice given to you by your health care provider. Make sure you discuss any questions you have with your health care provider. Document Released: 11/09/2003 Document Revised: 11/20/2015 Document Reviewed: 09/17/2015 Elsevier Interactive Patient Education  [employer].  Please be sure medication list is accurate. If a new problem present, please set up appointment sooner than planned today.

## 2016-12-24 NOTE — Telephone Encounter (Signed)
Pt is calling and the pain is worse in her ear and she state that she can't bear the pain.

## 2016-12-24 NOTE — Telephone Encounter (Signed)
I prefer Cortisporin because it has steroid medication to help with inflammation. Do you mild calling in for Cortisporin otic to apply 3 drops in left ear tid for 7-10 days.Cancel Ciprodex. Thanks, BJ

## 2016-12-24 NOTE — Progress Notes (Signed)
ACUTE VISIT   HPI:  Chief Complaint  Patient presents with  . left ear pain    [name] is a 66 y.o. female, who is here today complaining of 8-10 hours of "so bad" left earache.  Earache started suddenly today at 3 am. "A pain", constant, in a scale from 1-10 she reports "15/10." + Fullness sensation, cannot "pop" ear.  Pain is radiated to left occipital head, have a headache in this area. She has not noted skin erythema or edema.  She denies recent travel, URI, or ear trauma.  Otalgia   There is pain in the left ear. This is a new problem. The current episode started in the past 7 days. The problem has been gradually worsening. There has been no fever. The pain is severe. Associated symptoms include coughing, headaches, rhinorrhea and a sore throat. Pertinent negatives include no abdominal pain, diarrhea, ear discharge, hearing loss, neck pain, rash or vomiting. She has tried nothing for the symptoms. There is no history of a chronic ear infection or hearing loss.   She has not identified exacerbating or alleviating factors.  No sick contact. She has not used OTC medication.  + Mild cough with occasional clear sputum and "little" sore throat.  Hx of chronic pain and anxiety, she is currently on Hydrocodone-Acetaminophen 5-325 mg and Clonazepam. Hx of DM II.   Review of Systems  Constitutional: Positive for activity change and fatigue. Negative for appetite change and fever.  HENT: Positive for congestion, ear pain, postnasal drip, rhinorrhea and sore throat. Negative for dental problem, ear discharge, facial swelling, hearing loss, mouth sores, nosebleeds, sinus pressure, sneezing, trouble swallowing and voice change.   Eyes: Negative for discharge, redness and itching.  Respiratory: Positive for cough. Negative for chest tightness, shortness of breath and wheezing.   Gastrointestinal: Positive for nausea ("Little" nausea). Negative for abdominal pain,  diarrhea and vomiting.  Musculoskeletal: Negative for gait problem, myalgias and neck pain.  Skin: Negative for pallor and rash.  Allergic/Immunologic: Negative for environmental allergies.  Neurological: Positive for headaches. Negative for facial asymmetry and weakness.  Hematological: Negative for adenopathy. Does not bruise/bleed easily.  Psychiatric/Behavioral: Positive for sleep disturbance. Negative for confusion. The patient is nervous/anxious.       Current Outpatient Prescriptions on File Prior to Visit  Medication Sig Dispense Refill  . Biotin 10000 MCG TABS Take 10,000 mcg by mouth 2 (two) times a week.    . clonazePAM (KLONOPIN) 1 MG tablet Take 1 mg by mouth 2 (two) times daily as needed for anxiety.    . dapagliflozin propanediol (FARXIGA) 10 MG TABS tablet Take 10 mg by mouth daily. 90 tablet 2  . diclofenac sodium (VOLTAREN) 1 % GEL Apply 2-4 g topically 4 (four) times daily.    . DULoxetine (CYMBALTA) 30 MG capsule Take 60 mg by mouth every morning.    . gabapentin (NEURONTIN) 400 MG capsule Take 800 mg by mouth at bedtime.     . hydrochlorothiazide (HYDRODIURIL) 25 MG tablet Take 1 tablet (25 mg total) by mouth daily. 90 tablet 1  . HYDROcodone-acetaminophen (NORCO/VICODIN) 5-325 MG tablet Take 1 tablet by mouth every 8 (eight) hours as needed for moderate pain. 80 tablet 0  . linaclotide (LINZESS) 290 MCG CAPS capsule Take 1 capsule (290 mcg total) by mouth daily before breakfast. 30 capsule 6  . Melatonin 10 MG TABS Take 10 mg by mouth at bedtime.    . multivitamin-lutein (OCUVITE-LUTEIN) CAPS capsule  Take 1 capsule by mouth daily.    Marland Kitchen omeprazole (PRILOSEC) 40 MG capsule Take 1 capsule (40 mg total) by mouth daily. 90 capsule 3  . potassium chloride SA (K-DUR,KLOR-CON) 20 MEQ tablet Take 1 tablet (20 mEq total) by mouth daily. 90 tablet 3  . rosuvastatin (CRESTOR) 10 MG tablet Take 10 mg by mouth daily.      No current facility-administered medications on file prior  to visit.      Past Medical History:  Diagnosis Date  . Anxiety   . Arthritis   . Diabetes mellitus without complication ([place])   . Hyperlipidemia   . Hypertension   . PTSD (post-traumatic stress disorder)    Allergies  Allergen Reactions  . Dilaudid [Hydromorphone] Nausea And Vomiting and Other (See Comments)    Made the patient feel "spaced out," also  . Morphine And Related Itching  . Penicillins Rash    Has patient had a PCN reaction causing immediate rash, facial/tongue/throat swelling, SOB or lightheadedness with hypotension: Yes Has patient had a PCN reaction causing severe rash involving mucus membranes or skin necrosis: No Has patient had a PCN reaction that required hospitalization: No Has patient had a PCN reaction occurring within the last 10 years: No If all of the above answers are "NO", then may proceed with Cephalosporin use.     Social History   Social History  . Marital status: Divorced    Spouse name: N/A  . Number of children: N/A  . Years of education: N/A   Social History Main Topics  . Smoking status: Never Smoker  . Smokeless tobacco: Never Used  . Alcohol use No  . Drug use: No  . Sexual activity: Not Asked   Other Topics Concern  . None   Social History Narrative  . None    Vitals:   12/24/16 1117  BP: 130/90  Pulse: 78  Resp: 12  Temp: (!) 97.5 F (36.4 C)  SpO2: 98%   There is no height or weight on file to calculate BMI.   Physical Exam  Nursing note and vitals reviewed. Constitutional: She is oriented to person, place, and time. She appears well-developed. She does not appear ill. She appears distressed (due to pain).  HENT:  Head: Normocephalic and atraumatic.  Right Ear: Tympanic membrane, external ear and ear canal normal.  Left Ear: There is swelling (ear canal) and tenderness. No drainage. There is mastoid tenderness (Mild, no edema or erythema). Tympanic membrane is erythematous (on upper aspect of TM.). Tympanic  membrane is not bulging.  Ears:  Nose: Rhinorrhea present. Right sinus exhibits no maxillary sinus tenderness and no frontal sinus tenderness. Left sinus exhibits no maxillary sinus tenderness and no frontal sinus tenderness.  Mouth/Throat: Uvula is midline, oropharynx is clear and moist and mucous membranes are normal.  Eyes: Conjunctivae are normal.  Neck: No edema and no erythema present.  Cardiovascular: Normal rate and regular rhythm.   No murmur heard. Respiratory: Effort normal and breath sounds normal. No respiratory distress.  GI: Soft. There is no tenderness.  Musculoskeletal: She exhibits no edema or tenderness.  Lymphadenopathy:       Head (left side): Preauricular adenopathy present. No posterior auricular adenopathy present.    She has no cervical adenopathy.  Neurological: She is alert and oriented to person, place, and time. She has normal strength.  Skin: Skin is warm. No rash noted. No erythema.  Psychiatric: Her mood appears anxious. Her affect is labile.  Poor groomed, poor  eye contact.    ASSESSMENT AND PLAN:   [name] was seen today for left ear pain.  Diagnoses and all orders for this visit:  Left otitis media, unspecified otitis media type  Partial erythema of TM. She is allergic to Penicillins (rash), so Omnicef recommended. Instructed about warning signs.   -     cefdinir (OMNICEF) 300 MG capsule; Take 1 capsule (300 mg total) by mouth 2 (two) times daily. -     ketorolac (TORADOL) injection 60 mg; Inject 2 mLs (60 mg total) into the muscle once.  Acute otitis externa of left ear, unspecified type  Moderate, no erythema on auricula or around left ear. Topical abx, Ciprodex, recommended for 7-10 days. Here in the office and after verbal consent and discussion of side effects, she received Toradol 60 mg IM. General recommendations and education about diagnoses were also given. Clearly instructed about warning signs. Follow-up in a week if she  still having any problems, before if needed.  -     ciprofloxacin-dexamethasone (CIPRODEX) OTIC suspension; Place 4 drops into the left ear 2 (two) times daily. -     ketorolac (TORADOL) injection 60 mg; Inject 2 mLs (60 mg total) into the muscle once.      Return in about 1 week (around 12/31/2016), or if symptoms worsen or fail to improve, for if still having pain..     -[name] was advised to seek immediate medical attention if sudden worsening symptoms.     Devarius Nelles G. Martinique, MD  [facility]. [place] office.

## 2016-12-24 NOTE — Telephone Encounter (Signed)
Ok thanks 

## 2016-12-24 NOTE — Telephone Encounter (Signed)
I attempted to call patient back, unable to leave message, voicemail box full

## 2016-12-25 ENCOUNTER — Other Ambulatory Visit (HOSPITAL_COMMUNITY): Payer: Medicare Other

## 2016-12-25 ENCOUNTER — Encounter: Payer: Self-pay | Admitting: Family Medicine

## 2016-12-26 ENCOUNTER — Ambulatory Visit (HOSPITAL_COMMUNITY): Payer: Medicare Other

## 2016-12-26 NOTE — Telephone Encounter (Signed)
Noted. BJ 

## 2016-12-26 NOTE — Telephone Encounter (Signed)
Pt is calling and state that the medication is not working and it is making her dizzy and can not sit up.  She state that she is not any better the ear really hurting her and she will not be taking any more of the medication.   Pt would like to have a call back.

## 2016-12-26 NOTE — Telephone Encounter (Signed)
I called and spoke with patient. She is still having bad ear pain and having some nausea and has thrown up. I advised patient to stop taking the medication, since that could be causing her nausea/vomiting. I scheduled patient for 11:45 tomorrow morning at the [facility] Saturday clinic to be checked out. Patient is agreeable with the plan, and will call them if she is unable to make the appointment due to how she is feeling. I also advised patient to try and drink some fluids to keep herself hydrated. Patient has a follow up with [place] on Tuesday.

## 2016-12-27 ENCOUNTER — Emergency Department (HOSPITAL_COMMUNITY): Payer: Medicare Other

## 2016-12-27 ENCOUNTER — Encounter (HOSPITAL_COMMUNITY): Payer: Self-pay | Admitting: Emergency Medicine

## 2016-12-27 ENCOUNTER — Encounter: Payer: Self-pay | Admitting: Family Medicine

## 2016-12-27 ENCOUNTER — Ambulatory Visit (INDEPENDENT_AMBULATORY_CARE_PROVIDER_SITE_OTHER): Payer: Medicare Other | Admitting: Family Medicine

## 2016-12-27 ENCOUNTER — Inpatient Hospital Stay (HOSPITAL_COMMUNITY)
Admission: EM | Admit: 2016-12-27 | Discharge: 2016-12-31 | DRG: 153 | Disposition: A | Discharge status: Home / Self Care | Payer: Medicare Other | Attending: Internal Medicine | Admitting: Internal Medicine

## 2016-12-27 VITALS — BP 128/86 | HR 83 | Temp 97.9°F

## 2016-12-27 DIAGNOSIS — E7800 Pure hypercholesterolemia, unspecified: Secondary | ICD-10-CM | POA: Diagnosis not present

## 2016-12-27 DIAGNOSIS — M19042 Primary osteoarthritis, left hand: Secondary | ICD-10-CM | POA: Diagnosis present

## 2016-12-27 DIAGNOSIS — H9202 Otalgia, left ear: Secondary | ICD-10-CM | POA: Diagnosis not present

## 2016-12-27 DIAGNOSIS — H6022 Malignant otitis externa, left ear: Secondary | ICD-10-CM

## 2016-12-27 DIAGNOSIS — Z9049 Acquired absence of other specified parts of digestive tract: Secondary | ICD-10-CM

## 2016-12-27 DIAGNOSIS — H60322 Hemorrhagic otitis externa, left ear: Secondary | ICD-10-CM

## 2016-12-27 DIAGNOSIS — E785 Hyperlipidemia, unspecified: Secondary | ICD-10-CM | POA: Diagnosis not present

## 2016-12-27 DIAGNOSIS — I10 Essential (primary) hypertension: Secondary | ICD-10-CM | POA: Diagnosis not present

## 2016-12-27 DIAGNOSIS — H6692 Otitis media, unspecified, left ear: Principal | ICD-10-CM | POA: Diagnosis present

## 2016-12-27 DIAGNOSIS — Z880 Allergy status to penicillin: Secondary | ICD-10-CM

## 2016-12-27 DIAGNOSIS — H6092 Unspecified otitis externa, left ear: Secondary | ICD-10-CM | POA: Diagnosis not present

## 2016-12-27 DIAGNOSIS — H60392 Other infective otitis externa, left ear: Secondary | ICD-10-CM

## 2016-12-27 DIAGNOSIS — M1990 Unspecified osteoarthritis, unspecified site: Secondary | ICD-10-CM | POA: Diagnosis present

## 2016-12-27 DIAGNOSIS — E860 Dehydration: Secondary | ICD-10-CM | POA: Diagnosis not present

## 2016-12-27 DIAGNOSIS — H7092 Unspecified mastoiditis, left ear: Secondary | ICD-10-CM | POA: Diagnosis not present

## 2016-12-27 DIAGNOSIS — H6090 Unspecified otitis externa, unspecified ear: Secondary | ICD-10-CM | POA: Diagnosis present

## 2016-12-27 DIAGNOSIS — R112 Nausea with vomiting, unspecified: Secondary | ICD-10-CM | POA: Diagnosis not present

## 2016-12-27 DIAGNOSIS — Z90710 Acquired absence of both cervix and uterus: Secondary | ICD-10-CM

## 2016-12-27 DIAGNOSIS — K5900 Constipation, unspecified: Secondary | ICD-10-CM | POA: Diagnosis present

## 2016-12-27 DIAGNOSIS — F419 Anxiety disorder, unspecified: Secondary | ICD-10-CM | POA: Diagnosis present

## 2016-12-27 DIAGNOSIS — R52 Pain, unspecified: Secondary | ICD-10-CM

## 2016-12-27 DIAGNOSIS — Z7984 Long term (current) use of oral hypoglycemic drugs: Secondary | ICD-10-CM

## 2016-12-27 DIAGNOSIS — G2581 Restless legs syndrome: Secondary | ICD-10-CM | POA: Diagnosis present

## 2016-12-27 DIAGNOSIS — Z23 Encounter for immunization: Secondary | ICD-10-CM

## 2016-12-27 DIAGNOSIS — R42 Dizziness and giddiness: Secondary | ICD-10-CM

## 2016-12-27 DIAGNOSIS — E118 Type 2 diabetes mellitus with unspecified complications: Secondary | ICD-10-CM | POA: Diagnosis not present

## 2016-12-27 DIAGNOSIS — R404 Transient alteration of awareness: Secondary | ICD-10-CM | POA: Diagnosis not present

## 2016-12-27 DIAGNOSIS — E1149 Type 2 diabetes mellitus with other diabetic neurological complication: Secondary | ICD-10-CM

## 2016-12-27 DIAGNOSIS — Z800 Family history of malignant neoplasm of digestive organs: Secondary | ICD-10-CM

## 2016-12-27 DIAGNOSIS — K3184 Gastroparesis: Secondary | ICD-10-CM | POA: Diagnosis present

## 2016-12-27 DIAGNOSIS — E1143 Type 2 diabetes mellitus with diabetic autonomic (poly)neuropathy: Secondary | ICD-10-CM | POA: Diagnosis present

## 2016-12-27 DIAGNOSIS — Z885 Allergy status to narcotic agent status: Secondary | ICD-10-CM

## 2016-12-27 DIAGNOSIS — F4310 Post-traumatic stress disorder, unspecified: Secondary | ICD-10-CM | POA: Diagnosis not present

## 2016-12-27 DIAGNOSIS — Z833 Family history of diabetes mellitus: Secondary | ICD-10-CM

## 2016-12-27 DIAGNOSIS — Z801 Family history of malignant neoplasm of trachea, bronchus and lung: Secondary | ICD-10-CM

## 2016-12-27 DIAGNOSIS — H6690 Otitis media, unspecified, unspecified ear: Secondary | ICD-10-CM

## 2016-12-27 DIAGNOSIS — K219 Gastro-esophageal reflux disease without esophagitis: Secondary | ICD-10-CM

## 2016-12-27 DIAGNOSIS — H7090 Unspecified mastoiditis, unspecified ear: Secondary | ICD-10-CM | POA: Diagnosis not present

## 2016-12-27 DIAGNOSIS — B965 Pseudomonas (aeruginosa) (mallei) (pseudomallei) as the cause of diseases classified elsewhere: Secondary | ICD-10-CM | POA: Diagnosis present

## 2016-12-27 DIAGNOSIS — R110 Nausea: Secondary | ICD-10-CM | POA: Diagnosis not present

## 2016-12-27 LAB — CBC WITH DIFFERENTIAL/PLATELET
Basophils Absolute: 0 10*3/uL (ref 0.0–0.1)
Basophils Relative: 0 %
Eosinophils Absolute: 0.1 10*3/uL (ref 0.0–0.7)
Eosinophils Relative: 1 %
HCT: 44 % (ref 36.0–46.0)
Hemoglobin: 15.6 g/dL — ABNORMAL HIGH (ref 12.0–15.0)
Lymphocytes Relative: 31 %
Lymphs Abs: 2.3 10*3/uL (ref 0.7–4.0)
MCH: 32.8 pg (ref 26.0–34.0)
MCHC: 35.5 g/dL (ref 30.0–36.0)
MCV: 92.4 fL (ref 78.0–100.0)
Monocytes Absolute: 0.8 10*3/uL (ref 0.1–1.0)
Monocytes Relative: 11 %
Neutro Abs: 4.2 10*3/uL (ref 1.7–7.7)
Neutrophils Relative %: 57 %
Platelets: 276 10*3/uL (ref 150–400)
RBC: 4.76 MIL/uL (ref 3.87–5.11)
RDW: 11.9 % (ref 11.5–15.5)
WBC: 7.4 10*3/uL (ref 4.0–10.5)

## 2016-12-27 LAB — BASIC METABOLIC PANEL
Anion gap: 14 (ref 5–15)
BUN: 15 mg/dL (ref 6–20)
CO2: 24 mmol/L (ref 22–32)
Calcium: 10.1 mg/dL (ref 8.9–10.3)
Chloride: 101 mmol/L (ref 101–111)
Creatinine, Ser: 0.81 mg/dL (ref 0.44–1.00)
GFR calc Af Amer: >60 mL/min (ref >60)
GFR calc non Af Amer: >60 mL/min (ref >60)
Glucose, Bld: 126 mg/dL — ABNORMAL HIGH (ref 65–99)
Potassium: 3.7 mmol/L (ref 3.5–5.1)
Sodium: 139 mmol/L (ref 135–145)

## 2016-12-27 LAB — GLUCOSE, CAPILLARY: Glucose-Capillary: 196 mg/dL — ABNORMAL HIGH (ref 65–99)

## 2016-12-27 MED ORDER — ONDANSETRON HCL 4 MG/2ML IJ SOLN
4.0000 mg | Freq: Four times a day (QID) | INTRAMUSCULAR | Status: DC | PRN
  Administered 2016-12-27 – 2016-12-28 (×3): 4 mg via INTRAVENOUS
  Filled 2016-12-27 (×3): qty 2

## 2016-12-27 MED ORDER — POLYETHYLENE GLYCOL 3350 17 G PO PACK
17.0000 g | PACK | Freq: Every day | ORAL | Status: DC | PRN
  Administered 2016-12-30: 17 g via ORAL
  Filled 2016-12-27: qty 1

## 2016-12-27 MED ORDER — ACETAMINOPHEN 650 MG RE SUPP: 650.0000 mg | Freq: Four times a day (QID) | RECTAL | Status: DC | PRN

## 2016-12-27 MED ORDER — CLONAZEPAM 1 MG PO TABS
1.0000 mg | ORAL_TABLET | Freq: Two times a day (BID) | ORAL | Status: DC | PRN
  Administered 2016-12-27 – 2016-12-30 (×4): 1 mg via ORAL
  Filled 2016-12-27 (×4): qty 1

## 2016-12-27 MED ORDER — DEXTROSE 5 % IV SOLN
2.0000 g | Freq: Three times a day (TID) | INTRAVENOUS | Status: DC
  Administered 2016-12-28 – 2016-12-31 (×11): 2 g via INTRAVENOUS
  Filled 2016-12-27 (×12): qty 2

## 2016-12-27 MED ORDER — INSULIN ASPART 100 UNIT/ML ~~LOC~~ SOLN
0.0000 [IU] | SUBCUTANEOUS | Status: DC
  Administered 2016-12-27: 2 [IU] via SUBCUTANEOUS
  Administered 2016-12-28: 1 [IU] via SUBCUTANEOUS
  Administered 2016-12-28: 2 [IU] via SUBCUTANEOUS
  Administered 2016-12-28: 1 [IU] via SUBCUTANEOUS

## 2016-12-27 MED ORDER — VANCOMYCIN HCL IN DEXTROSE 1-5 GM/200ML-% IV SOLN
1000.0000 mg | Freq: Two times a day (BID) | INTRAVENOUS | Status: DC
  Administered 2016-12-27 – 2016-12-29 (×4): 1000 mg via INTRAVENOUS
  Filled 2016-12-27 (×4): qty 200

## 2016-12-27 MED ORDER — PANTOPRAZOLE SODIUM 40 MG PO TBEC
40.0000 mg | DELAYED_RELEASE_TABLET | Freq: Every day | ORAL | Status: DC
  Administered 2016-12-27 – 2016-12-31 (×5): 40 mg via ORAL
  Filled 2016-12-27 (×5): qty 1

## 2016-12-27 MED ORDER — GABAPENTIN 400 MG PO CAPS
800.0000 mg | ORAL_CAPSULE | Freq: Every day | ORAL | Status: DC
  Administered 2016-12-27 – 2016-12-30 (×4): 800 mg via ORAL
  Filled 2016-12-27 (×5): qty 2

## 2016-12-27 MED ORDER — SODIUM CHLORIDE 0.9 % IV BOLUS (SEPSIS)
1000.0000 mL | Freq: Once | INTRAVENOUS | Status: AC
Start: 2016-12-27 — End: 2016-12-27
  Administered 2016-12-27: 1000 mL via INTRAVENOUS

## 2016-12-27 MED ORDER — BISACODYL 10 MG RE SUPP: 10.0000 mg | Freq: Every day | RECTAL | Status: DC | PRN

## 2016-12-27 MED ORDER — KETOROLAC TROMETHAMINE 15 MG/ML IJ SOLN
15.0000 mg | Freq: Once | INTRAMUSCULAR | Status: AC
  Administered 2016-12-27: 15 mg via INTRAVENOUS
  Filled 2016-12-27: qty 1

## 2016-12-27 MED ORDER — PROMETHAZINE HCL 25 MG/ML IJ SOLN
12.5000 mg | Freq: Once | INTRAMUSCULAR | Status: AC
  Administered 2016-12-27: 12.5 mg via INTRAVENOUS
  Filled 2016-12-27: qty 1

## 2016-12-27 MED ORDER — ACETAMINOPHEN 325 MG PO TABS: 650.0000 mg | ORAL_TABLET | Freq: Four times a day (QID) | ORAL | Status: DC | PRN

## 2016-12-27 MED ORDER — LORAZEPAM 2 MG/ML IJ SOLN
0.5000 mg | Freq: Once | INTRAMUSCULAR | Status: AC
  Administered 2016-12-27: 0.5 mg via INTRAVENOUS
  Filled 2016-12-27: qty 1

## 2016-12-27 MED ORDER — ROSUVASTATIN CALCIUM 10 MG PO TABS
10.0000 mg | ORAL_TABLET | Freq: Every day | ORAL | Status: DC
  Administered 2016-12-27 – 2016-12-30 (×4): 10 mg via ORAL
  Filled 2016-12-27 (×4): qty 1

## 2016-12-27 MED ORDER — ONDANSETRON HCL 4 MG PO TABS
4.0000 mg | ORAL_TABLET | Freq: Four times a day (QID) | ORAL | Status: DC | PRN
  Administered 2016-12-30: 4 mg via ORAL
  Filled 2016-12-27: qty 1

## 2016-12-27 MED ORDER — DEXTROSE 5 % IV SOLN
2.0000 g | Freq: Once | INTRAVENOUS | Status: AC
  Administered 2016-12-27: 2 g via INTRAVENOUS
  Filled 2016-12-27: qty 2

## 2016-12-27 MED ORDER — CIPROFLOXACIN-DEXAMETHASONE 0.3-0.1 % OT SUSP
4.0000 [drp] | Freq: Once | OTIC | Status: AC
  Administered 2016-12-27: 4 [drp] via OTIC
  Filled 2016-12-27: qty 7.5

## 2016-12-27 MED ORDER — SENNA 8.6 MG PO TABS
1.0000 | ORAL_TABLET | Freq: Two times a day (BID) | ORAL | Status: DC
  Administered 2016-12-27 – 2016-12-31 (×8): 8.6 mg via ORAL
  Filled 2016-12-27 (×9): qty 1

## 2016-12-27 MED ORDER — DEXTROSE 5 % IV SOLN
800.0000 mg | Freq: Three times a day (TID) | INTRAVENOUS | Status: DC
  Administered 2016-12-28 (×2): 800 mg via INTRAVENOUS
  Filled 2016-12-27 (×3): qty 16

## 2016-12-27 MED ORDER — HYDROCODONE-ACETAMINOPHEN 5-325 MG PO TABS
1.0000 | ORAL_TABLET | ORAL | Status: DC | PRN
  Administered 2016-12-27: 2 via ORAL
  Administered 2016-12-28: 1 via ORAL
  Administered 2016-12-28 – 2016-12-29 (×5): 2 via ORAL
  Administered 2016-12-30: 1 via ORAL
  Administered 2016-12-30 – 2016-12-31 (×4): 2 via ORAL
  Filled 2016-12-27 (×8): qty 2
  Filled 2016-12-27: qty 1
  Filled 2016-12-27 (×3): qty 2

## 2016-12-27 MED ORDER — DULOXETINE HCL 60 MG PO CPEP
60.0000 mg | ORAL_CAPSULE | Freq: Every morning | ORAL | Status: DC
  Administered 2016-12-28 – 2016-12-31 (×4): 60 mg via ORAL
  Filled 2016-12-27 (×4): qty 1

## 2016-12-27 MED ORDER — DEXTROSE 5 % IV SOLN
700.0000 mg | Freq: Once | INTRAVENOUS | Status: AC
  Administered 2016-12-27: 700 mg via INTRAVENOUS
  Filled 2016-12-27: qty 14

## 2016-12-27 MED ORDER — HYDROMORPHONE HCL 1 MG/ML IJ SOLN
0.7500 mg | Freq: Once | INTRAMUSCULAR | Status: AC
  Administered 2016-12-27: 0.75 mg via INTRAVENOUS
  Filled 2016-12-27: qty 1

## 2016-12-27 MED ORDER — SODIUM CHLORIDE 0.9 % IV SOLN
INTRAVENOUS | Status: AC
  Administered 2016-12-27: 22:00:00 via INTRAVENOUS

## 2016-12-27 MED ORDER — KETOROLAC TROMETHAMINE 30 MG/ML IJ SOLN
30.0000 mg | Freq: Four times a day (QID) | INTRAMUSCULAR | Status: DC | PRN
  Administered 2016-12-29 – 2016-12-31 (×3): 30 mg via INTRAVENOUS
  Filled 2016-12-27 (×2): qty 1

## 2016-12-27 NOTE — ED Triage Notes (Signed)
Patient brought in by [facility] from [facility] for acute malignant otitis external of left ear and dizziness.  Patient was on antibiotics for ear infection but making dizziness and vomiting worse so she stopped them, but since dizziness progressed patient went to PCP today.   22g hand  159/98BP, 86HR, 16R, 99%on room air.

## 2016-12-27 NOTE — ED Provider Notes (Signed)
[facility] DEPT Provider Note   CSN: [id number] Arrival date & time: 12/27/16  1349     History   Chief Complaint Chief Complaint  Patient presents with  . Otitis Media    left  . Dizziness    HPI [name] is a 66 y.o. female.  HPI   65yF with L ear pain. Preceded by weeks of vertigo but no pain. On Tuesday, had acute onset of severe L ear pain and then drainage which has persisted. Seen by PCP on 9/19 and diagnosed with L OM/E. Started on cefdinir and ciprodex. Stopped cefdinir after a couple(?) doses because of n/v and felt that may be side effect. Reports otic abx are too painful to use. Tinnitus. Says it feels like bees are in her ear. Yellow drainage which is sometimes blood tinged. Seen in PCP's office again today. Referred to ED because of worsening symptoms, not tolerating outpt treatment and to evaluate for possible malignant infection.    Past Medical History:  Diagnosis Date  . Anxiety   . Arthritis   . Diabetes mellitus without complication ([place])   . Hyperlipidemia   . Hypertension   . PTSD (post-traumatic stress disorder)     Patient Active Problem List   Diagnosis Date Noted  . DOE (dyspnea on exertion) 12/16/2016  . Family history of early CAD 12/16/2016  . Other chest pain 12/16/2016  . Arthritis of left acromioclavicular joint 08/13/2016  . Left shoulder pain 05/12/2016  . Cervical spondylosis without myelopathy 05/12/2016  . Rotator cuff syndrome, left 05/12/2016  . Restless leg syndrome 05/12/2016  . Primary osteoarthritis, left hand 05/12/2016  . MELENA 10/18/2008  . GASTROPARESIS 05/30/2008  . DM (diabetes mellitus), type 2 ([place]) 05/04/2008  . HYPERCHOLESTEROLEMIA 05/04/2008  . HYPOKALEMIA 05/04/2008  . MORBID OBESITY 05/04/2008  . ANXIETY DEPRESSION 05/04/2008  . GERD 05/04/2008  . Constipation 05/04/2008  . HEMATEMESIS 05/04/2008  . Essential hypertension 05/04/2008    Past Surgical History:  Procedure Laterality Date  .  ABDOMINAL HYSTERECTOMY    . ANKLE SURGERY    . BACK SURGERY    . CHOLECYSTECTOMY    . NOSE SURGERY     x 2   . ROTATOR CUFF REPAIR      OB History    No data available       Home Medications    Prior to Admission medications   Medication Sig Start Date End Date Taking? Authorizing Provider  Biotin 10000 MCG TABS Take 10,000 mcg by mouth 2 (two) times a week.    [provider]  cefdinir (OMNICEF) 300 MG capsule Take 1 capsule (300 mg total) by mouth 2 (two) times daily. Patient not taking: Reported on 12/27/2016 12/24/16 01/03/17  [place], Betty G, MD  clonazePAM (KLONOPIN) 1 MG tablet Take 1 mg by mouth 2 (two) times daily as needed for anxiety.    [provider]  dapagliflozin propanediol (FARXIGA) 10 MG TABS tablet Take 10 mg by mouth daily. 11/24/16   [place], Betty G, MD  diclofenac sodium (VOLTAREN) 1 % GEL Apply 2-4 g topically 4 (four) times daily.    [provider]  DULoxetine (CYMBALTA) 30 MG capsule Take 60 mg by mouth every morning. 11/18/16   [provider]  gabapentin (NEURONTIN) 400 MG capsule Take 800 mg by mouth at bedtime.  03/07/16   [provider]  hydrochlorothiazide (HYDRODIURIL) 25 MG tablet Take 1 tablet (25 mg total) by mouth daily. 10/21/16   [place], Betty G,  MD  HYDROcodone-acetaminophen (NORCO/VICODIN) 5-325 MG tablet Take 1 tablet by mouth every 8 (eight) hours as needed for moderate pain. 11/17/16   Bayard Hugger, NP  linaclotide Rolan Lipa) 290 MCG CAPS capsule Take 1 capsule (290 mcg total) by mouth daily before breakfast. 12/23/16   Mauri Pole, MD  Melatonin 10 MG TABS Take 10 mg by mouth at bedtime.    [provider]  multivitamin-lutein (OCUVITE-LUTEIN) CAPS capsule Take 1 capsule by mouth daily.    [provider]  neomycin-polymyxin-hydrocortisone (CORTISPORIN) OTIC solution Place 3 drops into the left ear 3 (three) times daily. For 7-10 days 12/24/16   [place], Betty G, MD    omeprazole (PRILOSEC) 40 MG capsule Take 1 capsule (40 mg total) by mouth daily. 11/18/16   Mauri Pole, MD  potassium chloride SA (K-DUR,KLOR-CON) 20 MEQ tablet Take 1 tablet (20 mEq total) by mouth daily. 12/16/16   Nahser, Wonda Cheng, MD  rosuvastatin (CRESTOR) 10 MG tablet Take 10 mg by mouth daily.  08/07/15   [provider]    Family History Family History  Problem Relation Age of Onset  . Diabetes Mother   . Lung cancer Father   . Esophageal cancer Brother     Social History Social History  Substance Use Topics  . Smoking status: Never Smoker  . Smokeless tobacco: Never Used  . Alcohol use No     Allergies   Dilaudid [hydromorphone]; Morphine and related; and Penicillins   Review of Systems Review of Systems  All systems reviewed and negative, other than as noted in HPI.  Physical Exam Updated Vital Signs BP (!) 154/79 (BP Location: Right Arm)   Pulse 84   Temp (!) 97.4 F (36.3 C) (Oral)   Resp 18   Ht 5\' 8"  (1.727 m)   Wt 82.1 kg (181 lb)   SpO2 99%   BMI 27.52 kg/m   Physical Exam  Constitutional: She appears well-developed and well-nourished. She appears distressed.  Laying in bed. Lights in room off. Crying. Holding wet washcloth against ear. Some yellow drainage noted. Appears very uncomfortable.   HENT:  Head: Normocephalic.  Right Ear: External ear normal.  Mouth/Throat: Oropharynx is clear and moist.  R ext ear, ext aud canal and TM normal in appearance. L ext ear normal aside from yellow drainage noted on pillow next to ear. L ext aud canal edematous/macerated with yellow/white drainage. I could not visualize TM. She did not tolerate manipulation of pinna and had a lot of difficulty with barely inserting speculum. L mastoid tender and perhaps some mild erythema? No ear proptosis.   Eyes: Conjunctivae are normal. Right eye exhibits no discharge. Left eye exhibits no discharge.  Neck: Neck supple.  Cardiovascular: Normal rate, regular  rhythm and normal heart sounds.  Exam reveals no gallop and no friction rub.   No murmur heard. Pulmonary/Chest: Effort normal and breath sounds normal. No respiratory distress.  Abdominal: Soft. She exhibits no distension. There is no tenderness.  Musculoskeletal: She exhibits no edema or tenderness.  Neurological: She is alert.  Skin: Skin is warm and dry.  Psychiatric: She has a normal mood and affect. Her behavior is normal. Thought content normal.  Nursing note and vitals reviewed.    ED Treatments / Results  Labs (all labs ordered are listed, but only abnormal results are displayed) Labs Reviewed  CBC WITH DIFFERENTIAL/PLATELET  BASIC METABOLIC PANEL    EKG  EKG Interpretation None       Radiology  No results found.  Procedures Procedures (including critical care time)  Medications Ordered in ED Medications  sodium chloride 0.9 % bolus 1,000 mL (not administered)  promethazine (PHENERGAN) injection 12.5 mg (not administered)  HYDROmorphone (DILAUDID) injection 0.75 mg (not administered)  ciprofloxacin-dexamethasone (CIPRODEX) 0.3-0.1 % OTIC (EAR) suspension 4 drop (not administered)  ketorolac (TORADOL) 15 MG/ML injection 15 mg (not administered)  ceFEPIme (MAXIPIME) 1 g in dextrose 5 % 50 mL IVPB (not administered)     Initial Impression / Assessment and Plan / ED Course  I have reviewed the triage vital signs and the nursing notes.  Pertinent labs & imaging results that were available during my care of the patient were reviewed by me and considered in my medical decision making (see chart for details).     65yF with L OE. I cannot visualize her TM. TTP over mastoid and perhaps some mild erythema. No ear proptosis.  She could not tolerate me trying to suction or place an ear wick. Will medicate and try again. Will CT to evaluate for malignant otitis/mastoiditis.    She does seem extremely uncomfortable. I do not doubt that she is in pain given the appearance  of her ear, but question how much of this may be histrionics. Has interesting notes from ED visit on 12/22/16 where she reportedly urinated on the floor, yelling at staff, etc.   Care signed out to Dr Jeneen Rinks with imaging pending. If CT doesn't show emergent pathology then I feel that she can be discharged with ENT follow-up if she can tolerate medications. I suspect she may have to be admitted for symptom control though.   4:08 PM Ear suctioned. What I can visualize of TM red/angry but don't visualize a perf. Ear wick placed and ciprodex given.    Final Clinical Impressions(s) / ED Diagnoses   Final diagnoses:  Infective otitis externa of left ear  Acute otitis media, unspecified otitis media type    New Prescriptions New Prescriptions   No medications on file     Virgel Manifold, MD 12/28/16 [phone]

## 2016-12-27 NOTE — H&P (Signed)
[name] TTS:[id number] DOB: 05-08-1950 DOA: 12/27/2016     PCP: Martinique, Betty G, MD   Outpatient Specialists:[facility]   Patient coming from:   home Lives alone,        Chief Complaint: Left ear pain and drainage  HPI: [name] is a 66 y.o. female with medical history significant of diabetes mellitus, hypertension hyperlipidemia PTSD and anxiety    Presented with aggressive left ear pain associated with nausea and sensation of being dizziness. This been going on for past 5 days Drainage of blood from the ear  Patient was seen in the office on September 19 was diagnosed with acute otitis externa treated with antibiotic ear drops Cipro and Cefdinir but couldn't tolerate it secondary to nausea and vomiting. Putting drops in her ears was also hurting so she stopped Patient had not had any fevers but not able to eat and drink well. Reports tinnitus feels that be sent her ear. She was vomiting all day yesterday but today just a little sip of water. She reports that she felt a pop 4 days ago and it got worse. Patient was in severe pain when she presented to her primary care provider today crying out in agony and was sent to emergency department by EMS. Ear was suctioned was able to visualize TM which was erythematous no perforation year week wasn't placed and started Ciprodex.     Regarding pertinent Chronic problems: DM 2 on Farxiga, history of PTSD and depression on Cymbalta and Neurontin and Klonopin  Rotary Cuff tear goes to pain clinic at [facility]:  Temp (24hrs), Avg:97.7 F (36.5 C), Min:97.4 F (36.3 C), Max:97.9 F (36.6 C)      on arrival  ED Triage Vitals  Enc Vitals Group     BP 12/27/16 1409 (!) 154/79     Pulse Rate 12/27/16 1409 84     Resp 12/27/16 1409 18     Temp 12/27/16 1409 (!) 97.4 F (36.3 C)     Temp Source 12/27/16 1409 Oral     SpO2 12/27/16 1409 99 %     Weight 12/27/16 1409 181 lb (82.1 kg)     Height 12/27/16 1409 5\' 8"  (1.727  m)     Head Circumference --      Peak Flow --      Pain Score 12/27/16 1412 10     Pain Loc --      Pain Edu? --      Excl. in [place]? --     Latest  96% He 65 BP 128/69  wbc 7.4 hG 15.6 Na 139 K 3.7 cr 0.81 AG 14 CT no bony erosion but fluid opacification left external  canal tympanic cavity and mastoids   Following Medications were ordered in ER: Medications  acyclovir (ZOVIRAX) 700 mg in dextrose 5 % 100 mL IVPB (not administered)  sodium chloride 0.9 % bolus 1,000 mL (0 mLs Intravenous Stopped 12/27/16 1734)  promethazine (PHENERGAN) injection 12.5 mg (12.5 mg Intravenous Given 12/27/16 1541)  HYDROmorphone (DILAUDID) injection 0.75 mg (0.75 mg Intravenous Given 12/27/16 1545)  ciprofloxacin-dexamethasone (CIPRODEX) 0.3-0.1 % OTIC (EAR) suspension 4 drop (4 drops Left EAR Given by Other 12/27/16 1630)  ketorolac (TORADOL) 15 MG/ML injection 15 mg (15 mg Intravenous Given 12/27/16 1544)  ceFEPIme (MAXIPIME) 2 g in dextrose 5 % 50 mL IVPB (0 g Intravenous Stopped 12/27/16 1617)  LORazepam (ATIVAN) injection 0.5 mg (0.5 mg Intravenous Given 12/27/16 1538)  ER provider discussed case with:  [facility] Who recommends: Admission to medicine with broad-spectrum antibiotics and antivirals We'll see patient in consult in the morning    Hospitalist was called for admission for acute otitis media and otitis externa with mastoid effusion resulting in severe pain and dehydration secondary to persistent nausea and vomiting  Review of Systems:    Pertinent positives include:  fatigue, nausea, vomiting, ear pain, nasal congestion, tinnitus  Constitutional:  No weight loss, night sweats, Fevers, chills,weight loss  HEENT:  No headaches, Difficulty swallowing,Tooth/dental problems,Sore throat,  No sneezing, itching, ear ache, , post nasal drip,  Cardio-vascular:  No chest pain, Orthopnea, PND, anasarca, dizziness, palpitations.no Bilateral lower extremity swelling  GI:  No heartburn,  indigestion, abdominal pain,  diarrhea, change in bowel habits, loss of appetite, melena, blood in stool, hematemesis Resp:  no shortness of breath at rest. No dyspnea on exertion, No excess mucus, no productive cough, No non-productive cough, No coughing up of blood.No change in color of mucus.No wheezing. Skin:  no rash or lesions. No jaundice GU:  no dysuria, change in color of urine, no urgency or frequency. No straining to urinate.  No flank pain.  Musculoskeletal:  No joint pain or no joint swelling. No decreased range of motion. No back pain.  Psych:  No change in mood or affect. No depression or anxiety. No memory loss.  Neuro: no localizing neurological complaints, no tingling, no weakness, no double vision, no gait abnormality, no slurred speech, no confusion  As per HPI otherwise 10 point review of systems negative.   Past Medical History: Past Medical History:  Diagnosis Date  . Anxiety   . Arthritis   . Diabetes mellitus without complication ([place])   . Hyperlipidemia   . Hypertension   . PTSD (post-traumatic stress disorder)    Past Surgical History:  Procedure Laterality Date  . ABDOMINAL HYSTERECTOMY    . ANKLE SURGERY    . BACK SURGERY    . CHOLECYSTECTOMY    . NOSE SURGERY     x 2   . ROTATOR CUFF REPAIR       Social History:  Ambulatory   independently     reports that she has never smoked. She has never used smokeless tobacco. She reports that she does not drink alcohol or use drugs.  Allergies:   Allergies  Allergen Reactions  . Dilaudid [Hydromorphone] Nausea And Vomiting and Other (See Comments)    Made the patient feel "spaced out," also  . Morphine And Related Itching  . Penicillins Rash    Has patient had a PCN reaction causing immediate rash, facial/tongue/throat swelling, SOB or lightheadedness with hypotension: Yes Has patient had a PCN reaction causing severe rash involving mucus membranes or skin necrosis: No Has patient had a PCN  reaction that required hospitalization: No Has patient had a PCN reaction occurring within the last 10 years: No If all of the above answers are "NO", then may proceed with Cephalosporin use.        Family History:   Family History  Problem Relation Age of Onset  . Diabetes Mother   . Lung cancer Father   . Esophageal cancer Brother     Medications: Prior to Admission medications   Medication Sig Start Date End Date Taking? Authorizing Provider  Biotin 10000 MCG TABS Take 10,000 mcg by mouth 2 (two) times a week.    [provider]  cefdinir (OMNICEF) 300 MG capsule Take 1 capsule (300  mg total) by mouth 2 (two) times daily. Patient not taking: Reported on 12/27/2016 12/24/16 01/03/17  Martinique, Betty G, MD  clonazePAM (KLONOPIN) 1 MG tablet Take 1 mg by mouth 2 (two) times daily as needed for anxiety.    [provider]  dapagliflozin propanediol (FARXIGA) 10 MG TABS tablet Take 10 mg by mouth daily. 11/24/16   Martinique, Betty G, MD  diclofenac sodium (VOLTAREN) 1 % GEL Apply 2-4 g topically 4 (four) times daily.    [provider]  DULoxetine (CYMBALTA) 30 MG capsule Take 60 mg by mouth every morning. 11/18/16   [provider]  gabapentin (NEURONTIN) 400 MG capsule Take 800 mg by mouth at bedtime.  03/07/16   [provider]  hydrochlorothiazide (HYDRODIURIL) 25 MG tablet Take 1 tablet (25 mg total) by mouth daily. 10/21/16   Martinique, Betty G, MD  HYDROcodone-acetaminophen (NORCO/VICODIN) 5-325 MG tablet Take 1 tablet by mouth every 8 (eight) hours as needed for moderate pain. 11/17/16   Bayard Hugger, NP  linaclotide Rolan Lipa) 290 MCG CAPS capsule Take 1 capsule (290 mcg total) by mouth daily before breakfast. 12/23/16   Mauri Pole, MD  Melatonin 10 MG TABS Take 10 mg by mouth at bedtime.    [provider]  multivitamin-lutein (OCUVITE-LUTEIN) CAPS capsule Take 1 capsule by mouth daily.    [provider]    neomycin-polymyxin-hydrocortisone (CORTISPORIN) OTIC solution Place 3 drops into the left ear 3 (three) times daily. For 7-10 days 12/24/16   Martinique, Betty G, MD  omeprazole (PRILOSEC) 40 MG capsule Take 1 capsule (40 mg total) by mouth daily. 11/18/16   Mauri Pole, MD  potassium chloride SA (K-DUR,KLOR-CON) 20 MEQ tablet Take 1 tablet (20 mEq total) by mouth daily. 12/16/16   Nahser, Wonda Cheng, MD  rosuvastatin (CRESTOR) 10 MG tablet Take 10 mg by mouth daily.  08/07/15   [provider]    Physical Exam: Patient Vitals for the past 24 hrs:  BP Temp Temp src Pulse Resp SpO2 Height Weight  12/27/16 1830 128/69 - - 65 - 96 % - -  12/27/16 1821 136/65 - - 70 14 97 % - -  12/27/16 1800 136/65 - - 69 - 97 % - -  12/27/16 1730 117/67 - - 66 - 95 % - -  12/27/16 1700 116/66 - - 70 - 94 % - -  12/27/16 1548 130/79 - - 96 16 98 % - -  12/27/16 1500 130/79 - - 77 - 99 % - -  12/27/16 1430 (!) 141/82 - - 88 - 100 % - -  12/27/16 1409 (!) 154/79 (!) 97.4 F (36.3 C) Oral 84 18 99 % 5\' 8"  (1.727 m) 82.1 kg (181 lb)    1. General:  in No Acute distress  well  -appearing 2. Psychological: Alert and  Oriented 3. Head/ENT:     Dry Mucous Membranes                          Head Non traumatic, neck supple                            Poor Dentition                               Erythematous external ear canal LEFt 4. SKIN:  decreased Skin turgor,  Skin clean Dry and intact no rash 5. Heart: Regular rate and rhythm no  Murmur, no Rub or gallop 6. Lungs: Clear to auscultation bilaterally, no wheezes or crackles   7. Abdomen: Soft,  non-tender, Non distended obese  bowel sounds present 8. Lower extremities: no clubbing, cyanosis, or edema 9. Neurologically Grossly intact, moving all 4 extremities equally  10. MSK: Normal range of motion   body mass index is 27.52 kg/m.  Labs on Admission:   Labs on Admission: I have personally reviewed following labs and imaging  studies  CBC:  Recent Labs Lab 12/22/16 2143 12/27/16 1511  WBC 7.8 7.4  NEUTROABS 4.9 4.2  HGB 15.6* 15.6*  HCT 43.3 44.0  MCV 92.3 92.4  PLT 230 361   Basic Metabolic Panel:  Recent Labs Lab 12/22/16 2143 12/27/16 1511  NA 141 139  K 3.5 3.7  CL 102 101  CO2 27 24  GLUCOSE 123* 126*  BUN 16 15  CREATININE 0.86 0.81  CALCIUM 10.1 10.1   GFR: Estimated Creatinine Clearance: 78.9 mL/min (by C-G formula based on SCr of 0.81 mg/dL). Liver Function Tests:  Recent Labs Lab 12/22/16 2143  AST 22  ALT 23  ALKPHOS 67  BILITOT 1.1  PROT 7.9  ALBUMIN 4.7    Recent Labs Lab 12/22/16 2143  LIPASE 31   No results for input(s): AMMONIA in the last 168 hours. Coagulation Profile: No results for input(s): INR, PROTIME in the last 168 hours. Cardiac Enzymes: No results for input(s): CKTOTAL, CKMB, CKMBINDEX, TROPONINI in the last 168 hours. BNP (last 3 results) No results for input(s): PROBNP in the last 8760 hours. HbA1C: No results for input(s): HGBA1C in the last 72 hours. CBG: No results for input(s): GLUCAP in the last 168 hours. Lipid Profile: No results for input(s): CHOL, HDL, LDLCALC, TRIG, CHOLHDL, LDLDIRECT in the last 72 hours. Thyroid Function Tests: No results for input(s): TSH, T4TOTAL, FREET4, T3FREE, THYROIDAB in the last 72 hours. Anemia Panel: No results for input(s): VITAMINB12, FOLATE, FERRITIN, TIBC, IRON, RETICCTPCT in the last 72 hours. Urine analysis:    Component Value Date/Time   COLORURINE YELLOW 12/22/2016 2032   APPEARANCEUR CLEAR 12/22/2016 2032   LABSPEC 1.026 12/22/2016 2032   PHURINE 6.0 12/22/2016 2032   GLUCOSEU >=500 (A) 12/22/2016 2032   HGBUR NEGATIVE 12/22/2016 2032   BILIRUBINUR NEGATIVE 12/22/2016 2032   KETONESUR NEGATIVE 12/22/2016 2032   PROTEINUR NEGATIVE 12/22/2016 2032   UROBILINOGEN 0.2 03/09/2014 2118   NITRITE NEGATIVE 12/22/2016 2032   LEUKOCYTESUR NEGATIVE 12/22/2016 2032   Sepsis  Labs: @LABRCNTIP (procalcitonin:4,lacticidven:4) )No results found for this or any previous visit (from the past 240 hour(s)).    UA not ordered  Lab Results  Component Value Date   HGBA1C  03/21/2008    5.6 (NOTE)   The ADA recommends the following therapeutic goal for glycemic   control related to Hgb A1C measurement:   Goal of Therapy:   < 7.0% Hgb A1C   Reference: American Diabetes Association: Clinical Practice   Recommendations 2008, Diabetes Care,  2008, 31:(Suppl 1).    Estimated Creatinine Clearance: 78.9 mL/min (by C-G formula based on SCr of 0.81 mg/dL).  BNP (last 3 results) No results for input(s): PROBNP in the last 8760 hours.   ECG REPORT Not ordered  Filed Weights   12/27/16 1409  Weight: 82.1 kg (181 lb)     Cultures:    Component Value Date/Time   SDES URINE, RANDOM 08/28/2008 1138   SPECREQUEST  NONE 08/28/2008 1138   CULT  08/28/2008 1138    Multiple bacterial morphotypes present, none predominant. Suggest appropriate recollection if clinically indicated.   REPTSTATUS 08/29/2008 FINAL 08/28/2008 1138     Radiological Exams on Admission: Ct Temporal Bones Wo Contrast  Result Date: 12/27/2016 CLINICAL DATA:  66 year old female who was taking antibiotics for ear infection, but stopped due to progressed dizziness and vomiting. Otitis externa, mastoiditis. EXAM: CT TEMPORAL BONES WITHOUT CONTRAST TECHNIQUE: Axial and coronal plane CT imaging of the petrous temporal bones was performed with thin-collimation image reconstruction. No intravenous contrast was administered. Multiplanar CT image reconstructions were also generated. COMPARISON:  Head CT without contrast 06/16/2014. FINDINGS: Stable and negative visualized noncontrast brain parenchyma. Visualized orbits and scalp soft tissues are within normal limits. Negative visualized noncontrast deep soft tissue spaces of the face. In the superficial lobe of the right parotid gland there is an 8 mm rounded soft  tissue nodule which measured 5 mm in 2016. Negative right parotid space otherwise. Normal CT appearance of the right stylomastoid foramen. In general skullbase mineralization is within normal limits. Calcified atherosclerosis at the skull base. The paranasal sinuses are well pneumatized, with probable postoperative changes to the the bilateral maxillary and ethmoid sinuses. There is symmetric appearing nasal cavity mucosal thickening (series 6, image 15) with protrusion of the inferior terminates through medial maxillary wall defects. Right temporal bone: Mild debris in the right external auditory canal. The right tympanic membrane, tympanic cavity, and ossicles. The right mastoids are well pneumatized. Right IAC, cochlea, vestibule and vestibular aqueduct are within normal limits. Mildly high riding right IJ bulb (normal variant series 7, image 95). Semicircular canals and course of the right seventh nerve are within normal limits. Left temporal bone: Diffuse fluid and/or soft tissue thickening throughout the left external auditory canal which is largely opacified (series 8, image 96). The left tympanic membrane is not evident. There is opacification throughout the left tympanic cavity including surrounding the left ossicles. Still, the left scutum and ossicles appear to remain intact. The left mastoid antrum is opacified along with the majority of the left mastoid air cells. Air cell fluid levels are noted. No mastoid coalescence is identified. The left IAC, cochlea, vestibule and vestibular aqueduct are within normal limits. Left semicircular canals are within normal limits. The left pinna does not appear enlarged or swollen. IMPRESSION: 1. Fluid/opacification throughout the left EAC, left tympanic cavity, and mastoids. However, no associated bony erosion, mastoid coalescence, or other complicating features are identified. The appearance is compatible with combined Acute Otitis Media and Otitis Externa with  mastoid effusion. Repeat temporal bone CT (per for bleed with IV contrast) recommended if the patient does not improve as expected 2. Normal right temporal bone. 3. Symmetric appearing nasal cavity mucosal thickening raising the possibility of rhinitis. Well pneumatized visible paranasal sinuses, probably with prior maxillary and ethmoid sinus surgery. 4. Small right parotid space nodule, probably a small benign salivary gland neoplasm. Electronically Signed   By: Genevie Ann M.D.   On: 12/27/2016 16:25    Chart has been reviewed    Assessment/Plan   66 y.o. female with medical history significant of diabetes mellitus, hypertension hyperlipidemia PTSD and anxiety     Admitted for acute otitis media and otitis externa with mastoid effusion resulting in severe pain and dehydration secondary to persistent nausea and vomiting   Present on Admission:  . External otitis of left ear - given risk factors such as diabetes will continue broad-spectrum antibiotics to  cover Pseudomonas and MRSA as per recommendation ENT also added antiviral coverage currently on acyclovir, Maxipime and added vancomycin, ENT will see in AM. Will rehydrate and control pain . Dehydration  - rehydrate and check orthostatics tomorrow . Essential hypertension - currently stable continue home medications . GERD - currently stable continue to monitor . HYPERCHOLESTEROLEMIA - stable continue statin DM 2 -  - Order Sensitive  SSI   -  check TSH and HgA1C  - Hold by mouth medications    Other plan as per orders.  DVT prophylaxis:  SCD    Code Status:  FULL CODE as per patient    Family Communication:   Family not  at  Bedside   Disposition Plan:     To home once workup is complete and patient is stable                        Consults called: ENT aware   Admission status:    obs   Level of care         medical floor        I have spent a total of 56 min on this admission  Dianey Suchy 12/27/2016, 7:59 PM     [facility]  Pager [phone]   after 2 AM please page floor coverage PA If 7AM-7PM, please contact the day team taking care of the patient  [email or web address]  Password [email or web address]

## 2016-12-27 NOTE — Progress Notes (Signed)
OFFICE VISIT  12/27/2016  CC:  Chief Complaint  Patient presents with  . Otalgia    left... f/u from 12/24/2016...stopped Ax yesterday... pt is very dizzy and reports she has noticed drainage and blood in the ear  . Nausea    has not vomited since yesterday  . Emesis   HPI:    Patient is a 66 y.o.  female who presents accompanied by a female family member for severe left ear pain.  Was seen 12/24/16 and dx'd with acute OE, rx'd antibiotic ear drops and cefdinir but cefdinir med was stopped 9/19 due to suspected side effect of n/v.   Says she is unable to take ear drops b/c they cause to much pain to put in. Onset of this was 5 d/a abruptly, severe L ear pain.   Now sound is amplified greatly, says blood drainage noted from left EAC.  She is crying currently.  States she has lightheadness currently and question of some vertigo sx's.  No fever.  Not eating and drinking.  Had just a sip of water today, no vomiting today.  Was vomiting all day yesterday. She is lying on the exam table today crying in agony, pleading for me to do something for her immediately, holding wet washcloth to L ear.  Feels like she is going to fall off table when she is helped to sit up for exam.  Review of Systems  Constitutional: Negative for fatigue and fever.  HENT: Negative for congestion and sore throat.   Eyes: Negative for visual disturbance.  Respiratory: Negative for cough.   Cardiovascular: Negative for chest pain.  Gastrointestinal: Positive for nausea and vomiting. Negative for abdominal pain.  Genitourinary: Negative for dysuria.  Musculoskeletal: Negative for back pain and joint swelling.  Skin: Negative for rash.  Neurological: Positive for weakness. Negative for headaches.  Hematological: Negative for adenopathy.  Psychiatric/Behavioral: Positive for dysphoric mood and sleep disturbance. The patient is nervous/anxious.      Past Medical History:  Diagnosis Date  . Anxiety   . Arthritis   .  Diabetes mellitus without complication ([place])   . Hyperlipidemia   . Hypertension   . PTSD (post-traumatic stress disorder)     Past Surgical History:  Procedure Laterality Date  . ABDOMINAL HYSTERECTOMY    . ANKLE SURGERY    . BACK SURGERY    . CHOLECYSTECTOMY    . NOSE SURGERY     x 2   . ROTATOR CUFF REPAIR      Outpatient Medications Prior to Visit  Medication Sig Dispense Refill  . Biotin 10000 MCG TABS Take 10,000 mcg by mouth 2 (two) times a week.    . clonazePAM (KLONOPIN) 1 MG tablet Take 1 mg by mouth 2 (two) times daily as needed for anxiety.    . dapagliflozin propanediol (FARXIGA) 10 MG TABS tablet Take 10 mg by mouth daily. 90 tablet 2  . diclofenac sodium (VOLTAREN) 1 % GEL Apply 2-4 g topically 4 (four) times daily.    . DULoxetine (CYMBALTA) 30 MG capsule Take 60 mg by mouth every morning.    . gabapentin (NEURONTIN) 400 MG capsule Take 800 mg by mouth at bedtime.     . hydrochlorothiazide (HYDRODIURIL) 25 MG tablet Take 1 tablet (25 mg total) by mouth daily. 90 tablet 1  . HYDROcodone-acetaminophen (NORCO/VICODIN) 5-325 MG tablet Take 1 tablet by mouth every 8 (eight) hours as needed for moderate pain. 80 tablet 0  . linaclotide (LINZESS) 290 MCG CAPS capsule  Take 1 capsule (290 mcg total) by mouth daily before breakfast. 30 capsule 6  . Melatonin 10 MG TABS Take 10 mg by mouth at bedtime.    . multivitamin-lutein (OCUVITE-LUTEIN) CAPS capsule Take 1 capsule by mouth daily.    Marland Kitchen neomycin-polymyxin-hydrocortisone (CORTISPORIN) OTIC solution Place 3 drops into the left ear 3 (three) times daily. For 7-10 days 10 mL 0  . omeprazole (PRILOSEC) 40 MG capsule Take 1 capsule (40 mg total) by mouth daily. 90 capsule 3  . potassium chloride SA (K-DUR,KLOR-CON) 20 MEQ tablet Take 1 tablet (20 mEq total) by mouth daily. 90 tablet 3  . rosuvastatin (CRESTOR) 10 MG tablet Take 10 mg by mouth daily.     . cefdinir (OMNICEF) 300 MG capsule Take 1 capsule (300 mg total) by mouth 2  (two) times daily. (Patient not taking: Reported on 12/27/2016) 20 capsule 0   No facility-administered medications prior to visit.     Allergies  Allergen Reactions  . Dilaudid [Hydromorphone] Nausea And Vomiting and Other (See Comments)    Made the patient feel "spaced out," also  . Morphine And Related Itching  . Penicillins Rash    Has patient had a PCN reaction causing immediate rash, facial/tongue/throat swelling, SOB or lightheadedness with hypotension: Yes Has patient had a PCN reaction causing severe rash involving mucus membranes or skin necrosis: No Has patient had a PCN reaction that required hospitalization: No Has patient had a PCN reaction occurring within the last 10 years: No If all of the above answers are "NO", then may proceed with Cephalosporin use.     ROS As per HPI  PE: Blood pressure 128/86, pulse 83, temperature 97.9 F (36.6 C), temperature source Oral, SpO2 98 %. Gen: Alert, disheveled, crying in agony.  Non-toxic.  Patient is oriented to person, place, time, and situation. PERRL, EOMI. Right ear with normal EAC and TM.  Left ear is TTP all over, and her tenderness extends to preauricular region (although I cannot palpate any lymph nodes), and is most severe over L mastoid region.  No external ear erythema.  No external ear swelling.  She has severe tenderness when speculum is inserted into L EAC and view of TM is difficult but visualized portion is erythematous/mottled.  Distal EAC with some scant dull yellow/greeen exudate and mild erythema.  No signif EAC swelling. CV: RRR, no m/r/g.   LUNGS: CTA bilat, nonlabored resps, good aeration in all lung fields.  LABS:    Chemistry      Component Value Date/Time   NA 141 12/22/2016 2143   NA 142 12/16/2016 1517   K 3.5 12/22/2016 2143   CL 102 12/22/2016 2143   CO2 27 12/22/2016 2143   BUN 16 12/22/2016 2143   BUN 12 12/16/2016 1517   CREATININE 0.86 12/22/2016 2143      Component Value Date/Time    CALCIUM 10.1 12/22/2016 2143   ALKPHOS 67 12/22/2016 2143   AST 22 12/22/2016 2143   ALT 23 12/22/2016 2143   BILITOT 1.1 12/22/2016 2143   BILITOT 0.4 12/16/2016 1517       IMPRESSION AND PLAN:  Severe, worsening otitis with possible mastoiditis.  Sx's compatible with malignant OE but exam findings are not. She is nauseated and unable to take anything orally--no fluids, food, or meds. She has severe dizziness/imbalance due to current illness and is unable to ambulate sufficiently to leave the office or walk from a car to hospital.  I feel like she needs inpatient care  for intractible n/v, dehydration, IV abx, and possible imaging of mastoid region to r/o mastoiditis. EMS called for non-emergent transfer to [facility] hospital.  An After Visit Summary was printed and given to the patient.  FOLLOW UP: No Follow-up on file.  Signed:  Crissie Sickles, MD           12/27/2016

## 2016-12-27 NOTE — ED Provider Notes (Signed)
Patient seen and evaluated. Care assumed from Dr. Landry Mellow at. Care and patient discussed with ENT on-call, Dr. Kennith Center.  Patient's non-insulin-dependent diabetic. Painful ear with otitis externa. Wick placed by D. Tyler Aas. Exam does not show external ear or mastoid erythema, swelling.   Weber testing localizes to left ear.  CT scan shows fluid in the mastoid, middle ear, and edema and soft tissue swelling of the external ear. No bony destruction.  Patient has had Maxipime which should be good Proteus, and pseudomonal coverage. Thank twice and added for staph or MRSA coverage. Patient given acyclovir. No vesicles noted. Does not have cranial nerve deficits. However her pain is quite out of proportion which would be suspicious for herpeticum otic.  Were discussed with hospitalist regarding admission for IV antibiotics, pain control. Patient will be seen in consult by ENT, Dr. Erik Obey in the am.   Tanna Furry, MD 12/27/16 [phone]

## 2016-12-27 NOTE — Progress Notes (Signed)
Pharmacy Antibiotic Note  [name] is a 66 y.o. female with left ear pain and drainage admitted on 12/27/2016 with severe otitis externa in diabetic.  Pharmacy has been consulted for cefepime, vancomycin and acyclovir dosing.  Plan: Cefepim 2 Gm IV q8h Vancomycin 1 Gm IV q12h VT=15-20 mg/L Acyclovir 700 mg x1 then 800 mg IV q8h F/u scr/cultures/levels  Height: 5\' 8"  (172.7 cm) Weight: 181 lb (82.1 kg) IBW/kg (Calculated) : 63.9  Temp (24hrs), Avg:97.7 F (36.5 C), Min:97.4 F (36.3 C), Max:97.9 F (36.6 C)   Recent Labs Lab 12/22/16 2143 12/27/16 1511  WBC 7.8 7.4  CREATININE 0.86 0.81    Estimated Creatinine Clearance: 78.9 mL/min (by C-G formula based on SCr of 0.81 mg/dL).    Allergies  Allergen Reactions  . Dilaudid [Hydromorphone] Nausea And Vomiting and Other (See Comments)    Made the patient feel "spaced out," also  . Morphine And Related Itching  . Penicillins Rash    Has patient had a PCN reaction causing immediate rash, facial/tongue/throat swelling, SOB or lightheadedness with hypotension: Yes Has patient had a PCN reaction causing severe rash involving mucus membranes or skin necrosis: No Has patient had a PCN reaction that required hospitalization: No Has patient had a PCN reaction occurring within the last 10 years: No If all of the above answers are "NO", then may proceed with Cephalosporin use.     Antimicrobials this admission: 9/22 cefepime >>  9/22 vancomycin >>  9/22 acyclovir >>  Dose adjustments this admission:   Microbiology results:  BCx:   UCx:    Sputum:    MRSA PCR:   Thank you for allowing pharmacy to be a part of this patient's care.  Dorrene German 12/27/2016 9:22 PM

## 2016-12-27 NOTE — ED Notes (Signed)
ED TO INPATIENT HANDOFF REPORT  Name/Age/Gender [name] [place] 66 y.o. female  Code Status Code Status History    Date Active Date Inactive Code Status Order ID Comments User Context   06/05/2014  8:49 PM 06/05/2014  9:46 PM Full Code [id number]  Noland Fordyce, PA-C ED      Home/SNF/Other Home  Chief Complaint left ear pain, n/v - dizziness x 5 days  Level of Care/Admitting Diagnosis ED Disposition    ED Disposition Condition [facility] Area: [facility] [[id number]]  Level of Care: Med-Surg [16]  Diagnosis: External otitis [924268]  Admitting Physician: Toy Baker [[id number]]  Attending Physician: Toy Baker [[id number]]  PT Class (Do Not Modify): Observation [104]  PT Acc Code (Do Not Modify): Observation [10022]       Medical History Past Medical History:  Diagnosis Date  . Anxiety   . Arthritis   . Diabetes mellitus without complication ([place])   . Hyperlipidemia   . Hypertension   . PTSD (post-traumatic stress disorder)     Allergies Allergies  Allergen Reactions  . Dilaudid [Hydromorphone] Nausea And Vomiting and Other (See Comments)    Made the patient feel "spaced out," also  . Morphine And Related Itching  . Penicillins Rash    Has patient had a PCN reaction causing immediate rash, facial/tongue/throat swelling, SOB or lightheadedness with hypotension: Yes Has patient had a PCN reaction causing severe rash involving mucus membranes or skin necrosis: No Has patient had a PCN reaction that required hospitalization: No Has patient had a PCN reaction occurring within the last 10 years: No If all of the above answers are "NO", then may proceed with Cephalosporin use.     IV Location/Drains/Wounds Patient Lines/Drains/Airways Status   Active Line/Drains/Airways    Name:   Placement date:   Placement time:   Site:   Days:   Peripheral IV 12/27/16 Left Hand  12/27/16    1415    Hand    less than 1           Labs/Imaging Results for orders placed or performed during the hospital encounter of 12/27/16 (from the past 48 hour(s))  CBC with Differential     Status: Abnormal   Collection Time: 12/27/16  3:11 PM  Result Value Ref Range   WBC 7.4 4.0 - 10.5 K/uL   RBC 4.76 3.87 - 5.11 MIL/uL   Hemoglobin 15.6 (H) 12.0 - 15.0 g/dL   HCT 44.0 36.0 - 46.0 %   MCV 92.4 78.0 - 100.0 fL   MCH 32.8 26.0 - 34.0 pg   MCHC 35.5 30.0 - 36.0 g/dL   RDW 11.9 11.5 - 15.5 %   Platelets 276 150 - 400 K/uL   Neutrophils Relative % 57 %   Neutro Abs 4.2 1.7 - 7.7 K/uL   Lymphocytes Relative 31 %   Lymphs Abs 2.3 0.7 - 4.0 K/uL   Monocytes Relative 11 %   Monocytes Absolute 0.8 0.1 - 1.0 K/uL   Eosinophils Relative 1 %   Eosinophils Absolute 0.1 0.0 - 0.7 K/uL   Basophils Relative 0 %   Basophils Absolute 0.0 0.0 - 0.1 K/uL  Basic metabolic panel     Status: Abnormal   Collection Time: 12/27/16  3:11 PM  Result Value Ref Range   Sodium 139 135 - 145 mmol/L   Potassium 3.7 3.5 - 5.1 mmol/L   Chloride 101 101 - 111 mmol/L   CO2 24 22 - 32  mmol/L   Glucose, Bld 126 (H) 65 - 99 mg/dL   BUN 15 6 - 20 mg/dL   Creatinine, Ser 0.81 0.44 - 1.00 mg/dL   Calcium 10.1 8.9 - 10.3 mg/dL   GFR calc non Af Amer >60 >60 mL/min   GFR calc Af Amer >60 >60 mL/min    Comment: (NOTE) The eGFR has been calculated using the CKD EPI equation. This calculation has not been validated in all clinical situations. eGFR's persistently <60 mL/min signify possible Chronic Kidney Disease.    Anion gap 14 5 - 15   Ct Temporal Bones Wo Contrast  Result Date: 12/27/2016 CLINICAL DATA:  66 year old female who was taking antibiotics for ear infection, but stopped due to progressed dizziness and vomiting. Otitis externa, mastoiditis. EXAM: CT TEMPORAL BONES WITHOUT CONTRAST TECHNIQUE: Axial and coronal plane CT imaging of the petrous temporal bones was performed with thin-collimation image reconstruction. No intravenous contrast was  administered. Multiplanar CT image reconstructions were also generated. COMPARISON:  Head CT without contrast 06/16/2014. FINDINGS: Stable and negative visualized noncontrast brain parenchyma. Visualized orbits and scalp soft tissues are within normal limits. Negative visualized noncontrast deep soft tissue spaces of the face. In the superficial lobe of the right parotid gland there is an 8 mm rounded soft tissue nodule which measured 5 mm in 2016. Negative right parotid space otherwise. Normal CT appearance of the right stylomastoid foramen. In general skullbase mineralization is within normal limits. Calcified atherosclerosis at the skull base. The paranasal sinuses are well pneumatized, with probable postoperative changes to the the bilateral maxillary and ethmoid sinuses. There is symmetric appearing nasal cavity mucosal thickening (series 6, image 15) with protrusion of the inferior terminates through medial maxillary wall defects. Right temporal bone: Mild debris in the right external auditory canal. The right tympanic membrane, tympanic cavity, and ossicles. The right mastoids are well pneumatized. Right IAC, cochlea, vestibule and vestibular aqueduct are within normal limits. Mildly high riding right IJ bulb (normal variant series 7, image 95). Semicircular canals and course of the right seventh nerve are within normal limits. Left temporal bone: Diffuse fluid and/or soft tissue thickening throughout the left external auditory canal which is largely opacified (series 8, image 96). The left tympanic membrane is not evident. There is opacification throughout the left tympanic cavity including surrounding the left ossicles. Still, the left scutum and ossicles appear to remain intact. The left mastoid antrum is opacified along with the majority of the left mastoid air cells. Air cell fluid levels are noted. No mastoid coalescence is identified. The left IAC, cochlea, vestibule and vestibular aqueduct are within  normal limits. Left semicircular canals are within normal limits. The left pinna does not appear enlarged or swollen. IMPRESSION: 1. Fluid/opacification throughout the left EAC, left tympanic cavity, and mastoids. However, no associated bony erosion, mastoid coalescence, or other complicating features are identified. The appearance is compatible with combined Acute Otitis Media and Otitis Externa with mastoid effusion. Repeat temporal bone CT (per for bleed with IV contrast) recommended if the patient does not improve as expected 2. Normal right temporal bone. 3. Symmetric appearing nasal cavity mucosal thickening raising the possibility of rhinitis. Well pneumatized visible paranasal sinuses, probably with prior maxillary and ethmoid sinus surgery. 4. Small right parotid space nodule, probably a small benign salivary gland neoplasm. Electronically Signed   By: Genevie Ann M.D.   On: 12/27/2016 16:25    Pending Labs [employer]     Ordered  Signed and Held  Hemoglobin A1c  Once,   R    Comments:  To assess prior glycemic control    Signed and Held   Signed and Held  HIV antibody (Routine Testing)  Tomorrow morning,   R     Signed and Held   Signed and Held  Magnesium  Tomorrow morning,   R    Comments:  Call MD if <1.5    Signed and Held   Signed and Held  Phosphorus  Tomorrow morning,   R     Signed and Held   Signed and Held  TSH  Once,   R    Comments:  Cancel if already done within 1 month and notify MD    Signed and Held   Signed and Held  Comprehensive metabolic panel  Once,   R    Comments:  Cal MD for K<3.5 or >5.0    Signed and Held   Signed and Held  CBC  Once,   R    Comments:  Call for hg <8.0    Signed and Held      Vitals/Pain Today's Vitals   12/27/16 1800 12/27/16 1821 12/27/16 1830 12/27/16 2019  BP: 136/65 136/65 128/69 126/68  Pulse: 69 70 65 90  Resp:  14  16  Temp:      TempSrc:      SpO2: 97% 97% 96% 92%  Weight:      Height:      PainSc:         Isolation Precautions No active isolations  Medications Medications  sodium chloride 0.9 % bolus 1,000 mL (0 mLs Intravenous Stopped 12/27/16 1734)  promethazine (PHENERGAN) injection 12.5 mg (12.5 mg Intravenous Given 12/27/16 1541)  HYDROmorphone (DILAUDID) injection 0.75 mg (0.75 mg Intravenous Given 12/27/16 1545)  ciprofloxacin-dexamethasone (CIPRODEX) 0.3-0.1 % OTIC (EAR) suspension 4 drop (4 drops Left EAR Given by Other 12/27/16 1630)  ketorolac (TORADOL) 15 MG/ML injection 15 mg (15 mg Intravenous Given 12/27/16 1544)  ceFEPIme (MAXIPIME) 2 g in dextrose 5 % 50 mL IVPB (0 g Intravenous Stopped 12/27/16 1617)  LORazepam (ATIVAN) injection 0.5 mg (0.5 mg Intravenous Given 12/27/16 1538)  acyclovir (ZOVIRAX) 700 mg in dextrose 5 % 100 mL IVPB (700 mg Intravenous New Bag/Given 12/27/16 1948)    Mobility Ambulatory with assistance

## 2016-12-27 NOTE — ED Notes (Signed)
Made patient aware that once EDP puts in orders for pain medications, I will bring them in and administer them.  Also instructed patient that she needs to remain nothing to eat or drink until EDP consults with ENT specialist.  Patient verbalized understanding.

## 2016-12-27 NOTE — ED Notes (Signed)
Patient transported to CT 

## 2016-12-27 NOTE — ED Notes (Signed)
Bed: WA19 Expected date:  Expected time:  Means of arrival:  Comments: EMS 

## 2016-12-27 NOTE — ED Notes (Signed)
Made Dr Jeneen Rinks aware that patient requesting more pain medications.  Awaiting new orders, as none given at this time.   Patient needs to remain NPO until EDP can consult with specialist.

## 2016-12-28 DIAGNOSIS — R42 Dizziness and giddiness: Secondary | ICD-10-CM | POA: Diagnosis not present

## 2016-12-28 DIAGNOSIS — Z90710 Acquired absence of both cervix and uterus: Secondary | ICD-10-CM | POA: Diagnosis not present

## 2016-12-28 DIAGNOSIS — E785 Hyperlipidemia, unspecified: Secondary | ICD-10-CM | POA: Diagnosis present

## 2016-12-28 DIAGNOSIS — E7800 Pure hypercholesterolemia, unspecified: Secondary | ICD-10-CM | POA: Diagnosis present

## 2016-12-28 DIAGNOSIS — Z23 Encounter for immunization: Secondary | ICD-10-CM | POA: Diagnosis not present

## 2016-12-28 DIAGNOSIS — E119 Type 2 diabetes mellitus without complications: Secondary | ICD-10-CM | POA: Diagnosis not present

## 2016-12-28 DIAGNOSIS — H6092 Unspecified otitis externa, left ear: Secondary | ICD-10-CM | POA: Diagnosis not present

## 2016-12-28 DIAGNOSIS — Z833 Family history of diabetes mellitus: Secondary | ICD-10-CM | POA: Diagnosis not present

## 2016-12-28 DIAGNOSIS — K5900 Constipation, unspecified: Secondary | ICD-10-CM | POA: Diagnosis not present

## 2016-12-28 DIAGNOSIS — K3184 Gastroparesis: Secondary | ICD-10-CM | POA: Diagnosis present

## 2016-12-28 DIAGNOSIS — Z801 Family history of malignant neoplasm of trachea, bronchus and lung: Secondary | ICD-10-CM | POA: Diagnosis not present

## 2016-12-28 DIAGNOSIS — F4310 Post-traumatic stress disorder, unspecified: Secondary | ICD-10-CM | POA: Diagnosis present

## 2016-12-28 DIAGNOSIS — H9202 Otalgia, left ear: Secondary | ICD-10-CM | POA: Diagnosis not present

## 2016-12-28 DIAGNOSIS — Z7984 Long term (current) use of oral hypoglycemic drugs: Secondary | ICD-10-CM | POA: Diagnosis not present

## 2016-12-28 DIAGNOSIS — E118 Type 2 diabetes mellitus with unspecified complications: Secondary | ICD-10-CM | POA: Diagnosis not present

## 2016-12-28 DIAGNOSIS — D37030 Neoplasm of uncertain behavior of the parotid salivary glands: Secondary | ICD-10-CM | POA: Diagnosis not present

## 2016-12-28 DIAGNOSIS — I10 Essential (primary) hypertension: Secondary | ICD-10-CM | POA: Diagnosis not present

## 2016-12-28 DIAGNOSIS — G2581 Restless legs syndrome: Secondary | ICD-10-CM | POA: Diagnosis present

## 2016-12-28 DIAGNOSIS — E1143 Type 2 diabetes mellitus with diabetic autonomic (poly)neuropathy: Secondary | ICD-10-CM | POA: Diagnosis present

## 2016-12-28 DIAGNOSIS — K219 Gastro-esophageal reflux disease without esophagitis: Secondary | ICD-10-CM | POA: Diagnosis not present

## 2016-12-28 DIAGNOSIS — M1990 Unspecified osteoarthritis, unspecified site: Secondary | ICD-10-CM | POA: Diagnosis present

## 2016-12-28 DIAGNOSIS — Z800 Family history of malignant neoplasm of digestive organs: Secondary | ICD-10-CM | POA: Diagnosis not present

## 2016-12-28 DIAGNOSIS — H6690 Otitis media, unspecified, unspecified ear: Secondary | ICD-10-CM | POA: Diagnosis not present

## 2016-12-28 DIAGNOSIS — Z880 Allergy status to penicillin: Secondary | ICD-10-CM | POA: Diagnosis not present

## 2016-12-28 DIAGNOSIS — H60392 Other infective otitis externa, left ear: Secondary | ICD-10-CM | POA: Diagnosis not present

## 2016-12-28 DIAGNOSIS — H9212 Otorrhea, left ear: Secondary | ICD-10-CM | POA: Diagnosis not present

## 2016-12-28 DIAGNOSIS — M19042 Primary osteoarthritis, left hand: Secondary | ICD-10-CM | POA: Diagnosis present

## 2016-12-28 DIAGNOSIS — H9312 Tinnitus, left ear: Secondary | ICD-10-CM | POA: Diagnosis not present

## 2016-12-28 DIAGNOSIS — Z87828 Personal history of other (healed) physical injury and trauma: Secondary | ICD-10-CM | POA: Diagnosis not present

## 2016-12-28 DIAGNOSIS — F419 Anxiety disorder, unspecified: Secondary | ICD-10-CM | POA: Diagnosis present

## 2016-12-28 DIAGNOSIS — H93292 Other abnormal auditory perceptions, left ear: Secondary | ICD-10-CM | POA: Diagnosis not present

## 2016-12-28 DIAGNOSIS — H6692 Otitis media, unspecified, left ear: Secondary | ICD-10-CM | POA: Diagnosis present

## 2016-12-28 DIAGNOSIS — Z9049 Acquired absence of other specified parts of digestive tract: Secondary | ICD-10-CM | POA: Diagnosis not present

## 2016-12-28 DIAGNOSIS — Z885 Allergy status to narcotic agent status: Secondary | ICD-10-CM | POA: Diagnosis not present

## 2016-12-28 DIAGNOSIS — E860 Dehydration: Secondary | ICD-10-CM | POA: Diagnosis not present

## 2016-12-28 LAB — GLUCOSE, CAPILLARY
Glucose-Capillary: 118 mg/dL — ABNORMAL HIGH (ref 65–99)
Glucose-Capillary: 134 mg/dL — ABNORMAL HIGH (ref 65–99)
Glucose-Capillary: 134 mg/dL — ABNORMAL HIGH (ref 65–99)
Glucose-Capillary: 159 mg/dL — ABNORMAL HIGH (ref 65–99)
Glucose-Capillary: 161 mg/dL — ABNORMAL HIGH (ref 65–99)
Glucose-Capillary: 198 mg/dL — ABNORMAL HIGH (ref 65–99)

## 2016-12-28 LAB — COMPREHENSIVE METABOLIC PANEL
ALT: 15 U/L (ref 14–54)
AST: 15 U/L (ref 15–41)
Albumin: 3.5 g/dL (ref 3.5–5.0)
Alkaline Phosphatase: 82 U/L (ref 38–126)
Anion gap: 7 (ref 5–15)
BUN: 19 mg/dL (ref 6–20)
CO2: 28 mmol/L (ref 22–32)
Calcium: 8.5 mg/dL — ABNORMAL LOW (ref 8.9–10.3)
Chloride: 107 mmol/L (ref 101–111)
Creatinine, Ser: 0.95 mg/dL (ref 0.44–1.00)
GFR calc Af Amer: >60 mL/min (ref >60)
GFR calc non Af Amer: >60 mL/min (ref >60)
Glucose, Bld: 144 mg/dL — ABNORMAL HIGH (ref 65–99)
Potassium: 3.4 mmol/L — ABNORMAL LOW (ref 3.5–5.1)
Sodium: 142 mmol/L (ref 135–145)
Total Bilirubin: 0.5 mg/dL (ref 0.3–1.2)
Total Protein: 6.6 g/dL (ref 6.5–8.1)

## 2016-12-28 LAB — HEMOGLOBIN A1C
Hgb A1c MFr Bld: 7.1 % — ABNORMAL HIGH (ref 4.8–5.6)
Mean Plasma Glucose: 157.07 mg/dL

## 2016-12-28 LAB — CBC
HCT: 39 % (ref 36.0–46.0)
Hemoglobin: 13.3 g/dL (ref 12.0–15.0)
MCH: 32 pg (ref 26.0–34.0)
MCHC: 34.1 g/dL (ref 30.0–36.0)
MCV: 94 fL (ref 78.0–100.0)
Platelets: 239 10*3/uL (ref 150–400)
RBC: 4.15 MIL/uL (ref 3.87–5.11)
RDW: 12 % (ref 11.5–15.5)
WBC: 5.7 10*3/uL (ref 4.0–10.5)

## 2016-12-28 LAB — TSH: TSH: 1.948 u[IU]/mL (ref 0.350–4.500)

## 2016-12-28 LAB — HIV ANTIBODY (ROUTINE TESTING W REFLEX): HIV Screen 4th Generation wRfx: NONREACTIVE

## 2016-12-28 LAB — PHOSPHORUS: Phosphorus: 4.6 mg/dL (ref 2.5–4.6)

## 2016-12-28 LAB — MRSA PCR SCREENING: MRSA by PCR: NEGATIVE

## 2016-12-28 LAB — MAGNESIUM: Magnesium: 1.9 mg/dL (ref 1.7–2.4)

## 2016-12-28 MED ORDER — NEOMYCIN-POLYMYXIN-HC 3.5-10000-1 OT SUSP
3.0000 [drp] | Freq: Four times a day (QID) | OTIC | Status: DC
  Administered 2016-12-28 – 2016-12-31 (×12): 3 [drp] via OTIC
  Filled 2016-12-28: qty 10

## 2016-12-28 MED ORDER — TRAZODONE HCL 50 MG PO TABS
50.0000 mg | ORAL_TABLET | Freq: Once | ORAL | Status: AC
  Administered 2016-12-28: 50 mg via ORAL
  Filled 2016-12-28: qty 1

## 2016-12-28 MED ORDER — INSULIN ASPART 100 UNIT/ML ~~LOC~~ SOLN
0.0000 [IU] | Freq: Three times a day (TID) | SUBCUTANEOUS | Status: DC
  Administered 2016-12-28: 2 [IU] via SUBCUTANEOUS
  Administered 2016-12-29: 1 [IU] via SUBCUTANEOUS
  Administered 2016-12-29: 2 [IU] via SUBCUTANEOUS
  Administered 2016-12-29: 3 [IU] via SUBCUTANEOUS
  Administered 2016-12-30: 2 [IU] via SUBCUTANEOUS
  Administered 2016-12-30: 3 [IU] via SUBCUTANEOUS
  Administered 2016-12-30: 2 [IU] via SUBCUTANEOUS
  Administered 2016-12-30: 1 [IU] via SUBCUTANEOUS
  Administered 2016-12-31: 2 [IU] via SUBCUTANEOUS

## 2016-12-28 MED ORDER — INFLUENZA VAC SPLIT QUAD 0.5 ML IM SUSY
0.5000 mL | PREFILLED_SYRINGE | INTRAMUSCULAR | Status: AC
  Administered 2016-12-29: 0.5 mL via INTRAMUSCULAR
  Filled 2016-12-28: qty 0.5

## 2016-12-28 NOTE — Progress Notes (Signed)
PROGRESS NOTE    [name]  FTD:[id number] DOB: [birth year] DOA: 12/27/2016 PCP: [place], Betty G, MD    Brief Narrative:  66 year old female with history of diabetes presenting with left ear pain and drainage, not improved with outpatient antibiotics. Patient noted to have findings worrisome for otitis media. Patient started on broad-spectrum antibiotics and admitted for further workup. ENT was consulted in the emergency department.  Assessment & Plan:   Active Problems:   DM (diabetes mellitus), type 2 ([place])   HYPERCHOLESTEROLEMIA   GERD   Essential hypertension   External otitis of left ear   Dehydration   External otitis  . External otitis of left ear  - given risk factors such as diabetes will continue broad-spectrum antibiotics to cover Pseudomonas and MRSA - Patient is continued on acyclovir, Maxipime and vancomycin -ENT was consulted through the emergency department. -Afebrile overnight -Continue analgesics as needed  . Dehydration -Continue IV fluids as tolerated  . Essential hypertension -Blood pressure currently stable and controlled  - continue home medications as tolerated  . GERD  - currently stable continue to monitor  . HYPERCHOLESTEROLEMIA  - stable continue statin -Stable at present  DM 2  - We'll continue on Sensitive  SSI  -Random glucose reviewed, stable at present  -TSH within normal range -A1c of 7.1  - Hold oral hyperglycemic medications  Constipation -Patient reports no bowel movement over the past 6 days prior to his hospital admission -We'll give trial of soapsuds enema.  DVT prophylaxis: SCD's Code Status: Full Family Communication: Pt in room, family not at bedside Disposition Plan: Uncertain at this time  Consultants:   ENT  Procedures:     Antimicrobials: Anti-infectives    Start     Dose/Rate Route Frequency Ordered Stop   12/28/16 0400  acyclovir (ZOVIRAX) 800 mg in dextrose 5 % 150 mL IVPB     800 mg 166  mL/hr over 60 Minutes Intravenous Every 8 hours 12/27/16 2148     12/27/16 2359  ceFEPIme (MAXIPIME) 2 g in dextrose 5 % 50 mL IVPB     2 g 100 mL/hr over 30 Minutes Intravenous Every 8 hours 12/27/16 2148     12/27/16 2200  vancomycin (VANCOCIN) IVPB 1000 mg/200 mL premix     1,000 mg 200 mL/hr over 60 Minutes Intravenous Every 12 hours 12/27/16 2139     12/27/16 1900  acyclovir (ZOVIRAX) 700 mg in dextrose 5 % 100 mL IVPB     700 mg 114 mL/hr over 60 Minutes Intravenous  Once 12/27/16 1847 12/27/16 2048   12/27/16 1530  ceFEPIme (MAXIPIME) 2 g in dextrose 5 % 50 mL IVPB     2 g 100 mL/hr over 30 Minutes Intravenous  Once 12/27/16 1518 12/27/16 1617       Subjective: Still complaining of left ear pain. Also complaining of constipation. Reports no bowel movement in the past 6 days  Objective: Vitals:   12/27/16 1830 12/27/16 2019 12/27/16 2151 12/28/16 0617  BP: 128/69 126/68 129/69 125/77  Pulse: 65 90 77 60  Resp:  16 16 16   Temp:   97.7 F (36.5 C) 98 F (36.7 C)  TempSrc:   Oral Oral  SpO2: 96% 92% 100% 100%  Weight:   83.5 kg (184 lb)   Height:   5\' 8"  (1.727 m)     Intake/Output Summary (Last 24 hours) at 12/28/16 0756 Last data filed at 12/28/16 0618  Gross per 24 hour  Intake  1530 ml  Output              350 ml  Net             1180 ml   Filed Weights   12/27/16 1409 12/27/16 2151  Weight: 82.1 kg (181 lb) 83.5 kg (184 lb)    Examination:  General exam: Appears calm and comfortable  Respiratory system: Clear to auscultation. Respiratory effort normal. Cardiovascular system: S1 & S2 heard, RRR.  Gastrointestinal system: Mildly distended, positive bowel sounds Central nervous system: Alert and oriented. No focal neurological deficits. Extremities: Symmetric 5 x 5 power. Skin: No rashes, lesions  Psychiatry: Judgement and insight appear normal. Mood & affect appropriate.   Data Reviewed: I have personally reviewed following labs and imaging  studies  CBC:  Recent Labs Lab 12/22/16 2143 12/27/16 1511 12/28/16 0346  WBC 7.8 7.4 5.7  NEUTROABS 4.9 4.2  --   HGB 15.6* 15.6* 13.3  HCT 43.3 44.0 39.0  MCV 92.3 92.4 94.0  PLT 230 276 540   Basic Metabolic Panel:  Recent Labs Lab 12/22/16 2143 12/27/16 1511 12/28/16 0346  NA 141 139 142  K 3.5 3.7 3.4*  CL 102 101 107  CO2 27 24 28   GLUCOSE 123* 126* 144*  BUN 16 15 19   CREATININE 0.86 0.81 0.95  CALCIUM 10.1 10.1 8.5*  MG  --   --  1.9  PHOS  --   --  4.6   GFR: Estimated Creatinine Clearance: 67.7 mL/min (by C-G formula based on SCr of 0.95 mg/dL). Liver Function Tests:  Recent Labs Lab 12/22/16 2143 12/28/16 0346  AST 22 15  ALT 23 15  ALKPHOS 67 82  BILITOT 1.1 0.5  PROT 7.9 6.6  ALBUMIN 4.7 3.5    Recent Labs Lab 12/22/16 2143  LIPASE 31   No results for input(s): AMMONIA in the last 168 hours. Coagulation Profile: No results for input(s): INR, PROTIME in the last 168 hours. Cardiac Enzymes: No results for input(s): CKTOTAL, CKMB, CKMBINDEX, TROPONINI in the last 168 hours. BNP (last 3 results) No results for input(s): PROBNP in the last 8760 hours. HbA1C: No results for input(s): HGBA1C in the last 72 hours. CBG:  Recent Labs Lab 12/27/16 2202 12/28/16 0127 12/28/16 0408 12/28/16 0740  GLUCAP 196* 118* 134* 134*   Lipid Profile: No results for input(s): CHOL, HDL, LDLCALC, TRIG, CHOLHDL, LDLDIRECT in the last 72 hours. Thyroid Function Tests:  Recent Labs  12/28/16 0346  TSH 1.948   Anemia Panel: No results for input(s): VITAMINB12, FOLATE, FERRITIN, TIBC, IRON, RETICCTPCT in the last 72 hours. Sepsis Labs: No results for input(s): PROCALCITON, LATICACIDVEN in the last 168 hours.  No results found for this or any previous visit (from the past 240 hour(s)).   Radiology Studies: Ct Temporal Bones Wo Contrast  Result Date: 12/27/2016 CLINICAL DATA:  66 year old female who was taking antibiotics for ear infection, but  stopped due to progressed dizziness and vomiting. Otitis externa, mastoiditis. EXAM: CT TEMPORAL BONES WITHOUT CONTRAST TECHNIQUE: Axial and coronal plane CT imaging of the petrous temporal bones was performed with thin-collimation image reconstruction. No intravenous contrast was administered. Multiplanar CT image reconstructions were also generated. COMPARISON:  Head CT without contrast 06/16/2014. FINDINGS: Stable and negative visualized noncontrast brain parenchyma. Visualized orbits and scalp soft tissues are within normal limits. Negative visualized noncontrast deep soft tissue spaces of the face. In the superficial lobe of the right parotid gland there is an 8 mm rounded  soft tissue nodule which measured 5 mm in 2016. Negative right parotid space otherwise. Normal CT appearance of the right stylomastoid foramen. In general skullbase mineralization is within normal limits. Calcified atherosclerosis at the skull base. The paranasal sinuses are well pneumatized, with probable postoperative changes to the the bilateral maxillary and ethmoid sinuses. There is symmetric appearing nasal cavity mucosal thickening (series 6, image 15) with protrusion of the inferior terminates through medial maxillary wall defects. Right temporal bone: Mild debris in the right external auditory canal. The right tympanic membrane, tympanic cavity, and ossicles. The right mastoids are well pneumatized. Right IAC, cochlea, vestibule and vestibular aqueduct are within normal limits. Mildly high riding right IJ bulb (normal variant series 7, image 95). Semicircular canals and course of the right seventh nerve are within normal limits. Left temporal bone: Diffuse fluid and/or soft tissue thickening throughout the left external auditory canal which is largely opacified (series 8, image 96). The left tympanic membrane is not evident. There is opacification throughout the left tympanic cavity including surrounding the left ossicles. Still, the  left scutum and ossicles appear to remain intact. The left mastoid antrum is opacified along with the majority of the left mastoid air cells. Air cell fluid levels are noted. No mastoid coalescence is identified. The left IAC, cochlea, vestibule and vestibular aqueduct are within normal limits. Left semicircular canals are within normal limits. The left pinna does not appear enlarged or swollen. IMPRESSION: 1. Fluid/opacification throughout the left EAC, left tympanic cavity, and mastoids. However, no associated bony erosion, mastoid coalescence, or other complicating features are identified. The appearance is compatible with combined Acute Otitis Media and Otitis Externa with mastoid effusion. Repeat temporal bone CT (per for bleed with IV contrast) recommended if the patient does not improve as expected 2. Normal right temporal bone. 3. Symmetric appearing nasal cavity mucosal thickening raising the possibility of rhinitis. Well pneumatized visible paranasal sinuses, probably with prior maxillary and ethmoid sinus surgery. 4. Small right parotid space nodule, probably a small benign salivary gland neoplasm. Electronically Signed   By: Genevie Ann M.D.   On: 12/27/2016 16:25    Scheduled Meds: . DULoxetine  60 mg Oral q morning - 10a  . gabapentin  800 mg Oral QHS  . [START ON 12/29/2016] Influenza vac split quadrivalent PF  0.5 mL Intramuscular Tomorrow-1000  . insulin aspart  0-9 Units Subcutaneous Q4H  . pantoprazole  40 mg Oral Daily  . rosuvastatin  10 mg Oral q1800  . senna  1 tablet Oral BID   Continuous Infusions: . sodium chloride 100 mL/hr at 12/28/16 0616  . acyclovir Stopped (12/28/16 0457)  . ceFEPime (MAXIPIME) IV 2 g (12/28/16 0723)  . vancomycin Stopped (12/27/16 2323)     LOS: 0 days   Kendarious Gudino, Orpah Melter, MD [facility] Pager [phone]  If 7PM-7AM, please contact night-coverage [email or web address] Password [facility] 12/28/2016, 7:56 AM

## 2016-12-28 NOTE — Consult Note (Signed)
[name],  [name] 66 y.o., female [id number]     Chief Complaint: severe LEFT ear pain  HPI:  66 yo wf, type 2 DM with decent control, had sudden onset LEFT ear pain and drainage beginning 5 days ago.  Hearing muffled with extraneous noises.  Given Omnicef and Cortisporin solution by primary MD.  Complained of pain with ear drops.  Developed dizziness/?vertigo, nausea, vomiting and stopped her meds.  RIGHT ear unaffected.  No sore throat or nasal drainage.  Worsening ear status and admitted yesterday for pain control and IV abx.  LEFT ear wick placed.   Started on IV Maxipine, Vancomycin, Acyclovir.  no ear drops.  Elza Rafter fell out earlier today.  Today, pain is less.  She claims less post auricular swelling. Still LEFT ear drainage. No facial weakness.   No hx shingles.  Weber test in ED lateralized to LEFT.  WBC not elevated.  CBG decent.  Had reported vertigo for roughly 1 week one month ago.  Now with sl residual positional symptoms.    Had recent shoulder repair and has received several steroid injections and also using Hydrocodone.  Has been having major problems with constipation for several weeks.    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    ER MD mentioned malignant external otitis which she overheard and assumed she had some sort of cancer and has been duly alarmed.    PMH: Past Medical History:  Diagnosis Date  . Anxiety   . Arthritis   . Diabetes mellitus without complication ([place])   . Hyperlipidemia   . Hypertension   . PTSD (post-traumatic stress disorder)     Surg Hx: Past Surgical History:  Procedure Laterality Date  . ABDOMINAL HYSTERECTOMY    . ANKLE SURGERY    . BACK SURGERY    . CHOLECYSTECTOMY    . NOSE SURGERY     x 2   . ROTATOR CUFF REPAIR      FHx:   Family History  Problem Relation Age of Onset  . Diabetes Mother   . Lung cancer Father   . Esophageal cancer Brother    SocHx:  reports that she has never smoked.  She has never used smokeless tobacco. She reports that she does not drink alcohol or use drugs.  ALLERGIES:  Allergies  Allergen Reactions  . Dilaudid [Hydromorphone] Nausea And Vomiting and Other (See Comments)    Made the patient feel "spaced out," also  . Morphine And Related Itching  . Penicillins Rash    Has patient had a PCN reaction causing immediate rash, facial/tongue/throat swelling, SOB or lightheadedness with hypotension: Yes Has patient had a PCN reaction causing severe rash involving mucus membranes or skin necrosis: No Has patient had a PCN reaction that required hospitalization: No Has patient had a PCN reaction occurring within the last 10 years: No If all of the above answers are "NO", then may proceed with Cephalosporin use.     Medications Prior to Admission  Medication Sig Dispense Refill  . Biotin 10000 MCG TABS Take 10,000 mcg by mouth 2 (two) times a week.    . clonazePAM (KLONOPIN) 1 MG tablet Take 1 mg by mouth 2 (two) times daily as needed for anxiety.    . dapagliflozin propanediol (FARXIGA) 10 MG TABS tablet Take 10 mg by mouth daily. 90 tablet 2  . diclofenac sodium (VOLTAREN) 1 % GEL Apply 2-4 g topically 4 (four) times daily.    Marland Kitchen  DULoxetine (CYMBALTA) 30 MG capsule Take 60 mg by mouth every morning.    . gabapentin (NEURONTIN) 400 MG capsule Take 800 mg by mouth at bedtime.     . hydrochlorothiazide (HYDRODIURIL) 25 MG tablet Take 1 tablet (25 mg total) by mouth daily. 90 tablet 1  . HYDROcodone-acetaminophen (NORCO/VICODIN) 5-325 MG tablet Take 1 tablet by mouth every 8 (eight) hours as needed for moderate pain. 80 tablet 0  . linaclotide (LINZESS) 290 MCG CAPS capsule Take 1 capsule (290 mcg total) by mouth daily before breakfast. 30 capsule 6  . Melatonin 10 MG TABS Take 10 mg by mouth at bedtime.    . multivitamin-lutein (OCUVITE-LUTEIN) CAPS capsule Take 1 capsule by mouth daily.    Marland Kitchen omeprazole (PRILOSEC) 40 MG capsule Take 1 capsule (40 mg total)  by mouth daily. 90 capsule 3  . potassium chloride SA (K-DUR,KLOR-CON) 20 MEQ tablet Take 1 tablet (20 mEq total) by mouth daily. 90 tablet 3  . rosuvastatin (CRESTOR) 10 MG tablet Take 10 mg by mouth daily.       Results for orders placed or performed during the hospital encounter of 12/27/16 (from the past 48 hour(s))  CBC with Differential     Status: Abnormal   Collection Time: 12/27/16  3:11 PM  Result Value Ref Range   WBC 7.4 4.0 - 10.5 K/uL   RBC 4.76 3.87 - 5.11 MIL/uL   Hemoglobin 15.6 (H) 12.0 - 15.0 g/dL   HCT 44.0 36.0 - 46.0 %   MCV 92.4 78.0 - 100.0 fL   MCH 32.8 26.0 - 34.0 pg   MCHC 35.5 30.0 - 36.0 g/dL   RDW 11.9 11.5 - 15.5 %   Platelets 276 150 - 400 K/uL   Neutrophils Relative % 57 %   Neutro Abs 4.2 1.7 - 7.7 K/uL   Lymphocytes Relative 31 %   Lymphs Abs 2.3 0.7 - 4.0 K/uL   Monocytes Relative 11 %   Monocytes Absolute 0.8 0.1 - 1.0 K/uL   Eosinophils Relative 1 %   Eosinophils Absolute 0.1 0.0 - 0.7 K/uL   Basophils Relative 0 %   Basophils Absolute 0.0 0.0 - 0.1 K/uL  Basic metabolic panel     Status: Abnormal   Collection Time: 12/27/16  3:11 PM  Result Value Ref Range   Sodium 139 135 - 145 mmol/L   Potassium 3.7 3.5 - 5.1 mmol/L   Chloride 101 101 - 111 mmol/L   CO2 24 22 - 32 mmol/L   Glucose, Bld 126 (H) 65 - 99 mg/dL   BUN 15 6 - 20 mg/dL   Creatinine, Ser 0.81 0.44 - 1.00 mg/dL   Calcium 10.1 8.9 - 10.3 mg/dL   GFR calc non Af Amer >60 >60 mL/min   GFR calc Af Amer >60 >60 mL/min    Comment: (NOTE) The eGFR has been calculated using the CKD EPI equation. This calculation has not been validated in all clinical situations. eGFR's persistently <60 mL/min signify possible Chronic Kidney Disease.    Anion gap 14 5 - 15  Glucose, capillary     Status: Abnormal   Collection Time: 12/27/16 10:02 PM  Result Value Ref Range   Glucose-Capillary 196 (H) 65 - 99 mg/dL   Comment 1 Notify RN   Glucose, capillary     Status: Abnormal   Collection  Time: 12/28/16  1:27 AM  Result Value Ref Range   Glucose-Capillary 118 (H) 65 - 99 mg/dL  Hemoglobin A1c  Status: Abnormal   Collection Time: 12/28/16  3:46 AM  Result Value Ref Range   Hgb A1c MFr Bld 7.1 (H) 4.8 - 5.6 %    Comment: (NOTE) Pre diabetes:          5.7%-6.4% Diabetes:              >6.4% Glycemic control for   <7.0% adults with diabetes    Mean Plasma Glucose 157.07 mg/dL    Comment: Performed at [facility] [address], [place], [place] [address]  Magnesium     Status: None   Collection Time: 12/28/16  3:46 AM  Result Value Ref Range   Magnesium 1.9 1.7 - 2.4 mg/dL  Phosphorus     Status: None   Collection Time: 12/28/16  3:46 AM  Result Value Ref Range   Phosphorus 4.6 2.5 - 4.6 mg/dL  TSH     Status: None   Collection Time: 12/28/16  3:46 AM  Result Value Ref Range   TSH 1.948 0.350 - 4.500 uIU/mL    Comment: Performed by a 3rd Generation assay with a functional sensitivity of <=0.01 uIU/mL.  Comprehensive metabolic panel     Status: Abnormal   Collection Time: 12/28/16  3:46 AM  Result Value Ref Range   Sodium 142 135 - 145 mmol/L   Potassium 3.4 (L) 3.5 - 5.1 mmol/L   Chloride 107 101 - 111 mmol/L   CO2 28 22 - 32 mmol/L   Glucose, Bld 144 (H) 65 - 99 mg/dL   BUN 19 6 - 20 mg/dL   Creatinine, Ser 0.95 0.44 - 1.00 mg/dL   Calcium 8.5 (L) 8.9 - 10.3 mg/dL   Total Protein 6.6 6.5 - 8.1 g/dL   Albumin 3.5 3.5 - 5.0 g/dL   AST 15 15 - 41 U/L   ALT 15 14 - 54 U/L   Alkaline Phosphatase 82 38 - 126 U/L   Total Bilirubin 0.5 0.3 - 1.2 mg/dL   GFR calc non Af Amer >60 >60 mL/min   GFR calc Af Amer >60 >60 mL/min    Comment: (NOTE) The eGFR has been calculated using the CKD EPI equation. This calculation has not been validated in all clinical situations. eGFR's persistently <60 mL/min signify possible Chronic Kidney Disease.    Anion gap 7 5 - 15  CBC     Status: None   Collection Time: 12/28/16  3:46 AM  Result Value Ref Range   WBC  5.7 4.0 - 10.5 K/uL   RBC 4.15 3.87 - 5.11 MIL/uL   Hemoglobin 13.3 12.0 - 15.0 g/dL   HCT 39.0 36.0 - 46.0 %   MCV 94.0 78.0 - 100.0 fL   MCH 32.0 26.0 - 34.0 pg   MCHC 34.1 30.0 - 36.0 g/dL   RDW 12.0 11.5 - 15.5 %   Platelets 239 150 - 400 K/uL  Glucose, capillary     Status: Abnormal   Collection Time: 12/28/16  4:08 AM  Result Value Ref Range   Glucose-Capillary 134 (H) 65 - 99 mg/dL  Glucose, capillary     Status: Abnormal   Collection Time: 12/28/16  7:40 AM  Result Value Ref Range   Glucose-Capillary 134 (H) 65 - 99 mg/dL   Comment 1 Notify RN    Comment 2 Document in Chart   Glucose, capillary     Status: Abnormal   Collection Time: 12/28/16 12:15 PM  Result Value Ref Range   Glucose-Capillary 159 (H) 65 -  99 mg/dL   Comment 1 Notify RN    Comment 2 Document in Chart    Ct Temporal Bones Wo Contrast  Result Date: 12/27/2016 CLINICAL DATA:  66 year old female who was taking antibiotics for ear infection, but stopped due to progressed dizziness and vomiting. Otitis externa, mastoiditis. EXAM: CT TEMPORAL BONES WITHOUT CONTRAST TECHNIQUE: Axial and coronal plane CT imaging of the petrous temporal bones was performed with thin-collimation image reconstruction. No intravenous contrast was administered. Multiplanar CT image reconstructions were also generated. COMPARISON:  Head CT without contrast 06/16/2014. FINDINGS: Stable and negative visualized noncontrast brain parenchyma. Visualized orbits and scalp soft tissues are within normal limits. Negative visualized noncontrast deep soft tissue spaces of the face. In the superficial lobe of the right parotid gland there is an 8 mm rounded soft tissue nodule which measured 5 mm in 2016. Negative right parotid space otherwise. Normal CT appearance of the right stylomastoid foramen. In general skullbase mineralization is within normal limits. Calcified atherosclerosis at the skull base. The paranasal sinuses are well pneumatized, with  probable postoperative changes to the the bilateral maxillary and ethmoid sinuses. There is symmetric appearing nasal cavity mucosal thickening (series 6, image 15) with protrusion of the inferior terminates through medial maxillary wall defects. Right temporal bone: Mild debris in the right external auditory canal. The right tympanic membrane, tympanic cavity, and ossicles. The right mastoids are well pneumatized. Right IAC, cochlea, vestibule and vestibular aqueduct are within normal limits. Mildly high riding right IJ bulb (normal variant series 7, image 95). Semicircular canals and course of the right seventh nerve are within normal limits. Left temporal bone: Diffuse fluid and/or soft tissue thickening throughout the left external auditory canal which is largely opacified (series 8, image 96). The left tympanic membrane is not evident. There is opacification throughout the left tympanic cavity including surrounding the left ossicles. Still, the left scutum and ossicles appear to remain intact. The left mastoid antrum is opacified along with the majority of the left mastoid air cells. Air cell fluid levels are noted. No mastoid coalescence is identified. The left IAC, cochlea, vestibule and vestibular aqueduct are within normal limits. Left semicircular canals are within normal limits. The left pinna does not appear enlarged or swollen. IMPRESSION: 1. Fluid/opacification throughout the left EAC, left tympanic cavity, and mastoids. However, no associated bony erosion, mastoid coalescence, or other complicating features are identified. The appearance is compatible with combined Acute Otitis Media and Otitis Externa with mastoid effusion. Repeat temporal bone CT (per for bleed with IV contrast) recommended if the patient does not improve as expected 2. Normal right temporal bone. 3. Symmetric appearing nasal cavity mucosal thickening raising the possibility of rhinitis. Well pneumatized visible paranasal sinuses,  probably with prior maxillary and ethmoid sinus surgery. 4. Small right parotid space nodule, probably a small benign salivary gland neoplasm. Electronically Signed   By: Genevie Ann M.D.   On: 12/27/2016 16:25    ROS: no vision change.  No facial rash.  No headache or fever.       Blood pressure 127/69, pulse 62, temperature 97.9 F (36.6 C), temperature source Oral, resp. rate 16, height 5' 8"  (1.727 m), weight 83.5 kg (184 lb), SpO2 98 %.  PHYSICAL EXAM: Overall appearance:  She is anxious and labile.  Mental status basically intact Head:  NCAT Ears: RIGHT ear clear with nl aerated drum.  LEFT ear with sl tenderness at [facility] insertion but no actual  Mastoid erythema, swelling, tenderness.  Canal is slightly  moist and tender but minimally swollen.  Drum is difficult to see but appears thickened and erythematous.  No vesicles Nose:clear Oral Cavity: moist, clear.  No palatal vesicles. Oral Pharynx/Hypopharynx/Larynx:  OP clear  Neuro:  facial n intact both sides. Neck:  no nodes  Studies Reviewed:   CT  Temporal bones  Shows LEFT middle ear and mastoid effusion without bony coalescence.  Report describes opacification of the ear canal which would be consistent with the ear wick.     Assessment/Plan Probable severe otitis media with spontaneous rupture of the LEFT drum and mastoid effusion but not true clinical mastoiditis.  She might have some secondary external otitis from the middle ear drainage.  I do not think this is herpes zoster oticus.  Occasinally this could be MRSA.  She was given Cortisporin Otic Solution, which tends to burn.    Plan:  I took a culture from the LEFT ear canal, and will have the nurses swab for MRSA.  She is on good coverage for typical OM, also including Proteus, Pseudomonas, and MRSA.  I will add Cortisporin Suspension, which should not burn.    I spent time allaying her anxieties and explaining what I thought was going on.    I will follow with you.  Jodi Marble 9/45/8592, 3:21 PM

## 2016-12-29 ENCOUNTER — Encounter: Payer: Medicare Other | Admitting: Physical Medicine & Rehabilitation

## 2016-12-29 LAB — BASIC METABOLIC PANEL
Anion gap: 7 (ref 5–15)
BUN: 15 mg/dL (ref 6–20)
CO2: 26 mmol/L (ref 22–32)
Calcium: 8.7 mg/dL — ABNORMAL LOW (ref 8.9–10.3)
Chloride: 108 mmol/L (ref 101–111)
Creatinine, Ser: 0.86 mg/dL (ref 0.44–1.00)
GFR calc Af Amer: >60 mL/min (ref >60)
GFR calc non Af Amer: >60 mL/min (ref >60)
Glucose, Bld: 153 mg/dL — ABNORMAL HIGH (ref 65–99)
Potassium: 4.2 mmol/L (ref 3.5–5.1)
Sodium: 141 mmol/L (ref 135–145)

## 2016-12-29 LAB — CBC
HCT: 35.7 % — ABNORMAL LOW (ref 36.0–46.0)
Hemoglobin: 12.6 g/dL (ref 12.0–15.0)
MCH: 32.1 pg (ref 26.0–34.0)
MCHC: 35.3 g/dL (ref 30.0–36.0)
MCV: 91.1 fL (ref 78.0–100.0)
Platelets: 221 10*3/uL (ref 150–400)
RBC: 3.92 MIL/uL (ref 3.87–5.11)
RDW: 11.4 % — ABNORMAL LOW (ref 11.5–15.5)
WBC: 5 10*3/uL (ref 4.0–10.5)

## 2016-12-29 LAB — GLUCOSE, CAPILLARY
Glucose-Capillary: 147 mg/dL — ABNORMAL HIGH (ref 65–99)
Glucose-Capillary: 157 mg/dL — ABNORMAL HIGH (ref 65–99)
Glucose-Capillary: 120 mg/dL — ABNORMAL HIGH (ref 65–99)

## 2016-12-29 NOTE — Progress Notes (Signed)
Progress Note    [name]  ZSW:[id number] DOB: [birth year]  DOA: 12/27/2016 PCP: [place], Betty G, MD    Brief Narrative:   Chief complaint: Follow-up otitis media  Medical records reviewed and are as summarized below:  [name] is an 66 y.o. female the PMH of diabetes who was admitted 12/27/16 for otitis media but failed outpatient antibiotics.  Assessment/Plan:   Principal Problem:   Left ear Otitis media/External otitis Complicated by spontaneous rupture of the left eardrum associated with mastoid effusion ENT consulted and is following. Cultures obtained from the left ear canal. Continue Cortisporin suspension, cefepime, discontinue vancomycin since MRSA swab negative. Follow-up left ear canal cultures. Narrow antibiotics pending culture data.  Active Problems:   DM (diabetes mellitus), type 2 ([place]) Currently being managed with insulin sensitive SSI 3 times a day. CBGs 134-198. Farxiga on hold.    HYPERCHOLESTEROLEMIA Continue Crestor.    GERD Continue PPI.    Essential hypertension Blood pressure controlled.   Family Communication/Anticipated D/C date and plan/Code Status   DVT prophylaxis: SCDs ordered. Code Status: Full Code.  Family Communication: No family at the bedside, declines my offer to call. Disposition Plan: Possibly home 12/30/16 of culture results back.   Medical Consultants:    ENT   Anti-Infectives:    Cefepime 12/27/16--->  Vancomycin 12/27/16---> 12/29/16  Subjective:   No nausea, vomiting, diarrhea.  Appetite OK. Reports some left ear pain and drainage, but feels better overall.  Objective:    Vitals:   12/28/16 1346 12/28/16 2029 12/29/16 0524 12/29/16 1427  BP: 127/69 121/66 132/78 131/74  Pulse: 62 69 74 71  Resp: 16 16 18 20   Temp: 97.9 F (36.6 C) 97.9 F (36.6 C) 98.4 F (36.9 C) 97.8 F (36.6 C)  TempSrc: Oral Oral Oral Oral  SpO2: 98% 99% 100% 98%  Weight:      Height:        Intake/Output  Summary (Last 24 hours) at 12/29/16 1739 Last data filed at 12/29/16 1427  Gross per 24 hour  Intake             1540 ml  Output             2750 ml  Net            -1210 ml   Filed Weights   12/27/16 1409 12/27/16 2151  Weight: 82.1 kg (181 lb) 83.5 kg (184 lb)    Exam: General: No acute distress. Cardiovascular: Heart sounds show a regular rate, and rhythm. No gallops or rubs. No murmurs. No JVD. Lungs: Clear to auscultation bilaterally with good air movement. No rales, rhonchi or wheezes. Abdomen: Soft, nontender, nondistended with normal active bowel sounds. No masses. No hepatosplenomegaly. Skin: Warm and dry. No rashes or lesions. Extremities: No clubbing or cyanosis. No edema. Pedal pulses 2+.  Data Reviewed:   I have personally reviewed following labs and imaging studies:  Labs: Labs show the following: Sodium 141, potassium 4.2, chloride 108, bicarbonate 26, BUN 15, creatinine 0.86, glucose 153. WBC 5.0, hemoglobin 12.6, platelets 221. CBGs 134-198. Hemoglobin A1c 7.1%.  Microbiology 12/28/16: MRSA swab negative 12/28/16: Middle ear cultures  Procedures and diagnostic studies:  12/27/16: CT temporal bones: Combined acute otitis media and otitis externa with mastoid effusion.  Medications:   . DULoxetine  60 mg Oral q morning - 10a  . gabapentin  800 mg Oral QHS  . insulin aspart  0-9 Units Subcutaneous TID AC & HS  .  neomycin-polymyxin-hydrocortisone  3 drop Left EAR Q6H  . pantoprazole  40 mg Oral Daily  . rosuvastatin  10 mg Oral q1800  . senna  1 tablet Oral BID   Continuous Infusions: . ceFEPime (MAXIPIME) IV Stopped (12/29/16 1628)  . vancomycin Stopped (12/29/16 1153)     LOS: 1 day   RAMA,CHRISTINA  [facility] Pager [phone]. If unable to reach me by pager, please call my cell phone at [phone].  *Please refer to [email or web address], password [email or web address] to get updated schedule on who will round on this patient, as hospitalists switch teams  weekly. If 7PM-7AM, please contact night-coverage at [email or web address], password [email or web address] for any overnight needs.  12/29/2016, 5:39 PM      Information printed out and reviewed with the patient/family:     In an effort to keep you and your family informed about your hospital stay, I am providing you with this information sheet. If you or your family have any questions, please do not hesitate to have the nursing staff page me to set up a meeting time.  Also note that the hospitalist doctors typically change on Tuesdays or Wednesdays to a different hospitalist doctor.  [name] 12/29/2016 1 (Number of days in the hospital)  Treatment team:  Dr. Jacquelynn Cree, Hospitalist (Internist)  Dr. Erik Obey, Ear Nose and Throat specialist   Pertinent labs / studies: Basic Metabolic Panel:  Recent Labs Lab 12/22/16 2143 12/27/16 1511 12/28/16 0346 12/29/16 0342  NA 141 139 142 141  K 3.5 3.7 3.4* 4.2  CL 102 101 107 108  CO2 27 24 28 26   GLUCOSE 123* 126* 144* 153*  BUN 16 15 19 15   CREATININE 0.86 0.81 0.95 0.86  CALCIUM 10.1 10.1 8.5* 8.7*  MG  --   --  1.9  --   PHOS  --   --  4.6  --    GFR Estimated Creatinine Clearance: 74.8 mL/min (by C-G formula based on SCr of 0.86 mg/dL). Liver Function Tests:  Recent Labs Lab 12/22/16 2143 12/28/16 0346  AST 22 15  ALT 23 15  ALKPHOS 67 82  BILITOT 1.1 0.5  PROT 7.9 6.6  ALBUMIN 4.7 3.5    Recent Labs Lab 12/22/16 2143  LIPASE 31   CBC:  Recent Labs Lab 12/22/16 2143 12/27/16 1511 12/28/16 0346 12/29/16 0342  WBC 7.8 7.4 5.7 5.0  NEUTROABS 4.9 4.2  --   --   HGB 15.6* 15.6* 13.3 12.6  HCT 43.3 44.0 39.0 35.7*  MCV 92.3 92.4 94.0 91.1  PLT 230 276 239 221   CBG:  Recent Labs Lab 12/28/16 1215 12/28/16 1604 12/28/16 2152 12/29/16 0738 12/29/16 1219  GLUCAP 159* 161* 198* 147* 157*   Hgb A1c  Recent Labs  12/28/16 0346  HGBA1C 7.1*   Thyroid function studies  Recent Labs  12/28/16 0346   TSH 1.948   Microbiology Recent Results (from the past 240 hour(s))  Aerobic Culture (superficial specimen)     Status: None (Preliminary result)   Collection Time: 12/28/16  3:20 PM  Result Value Ref Range Status   Specimen Description EAR  Final   Special Requests NONE  Final   Gram Stain   Final    NO WBC SEEN RARE GRAM POSITIVE COCCI IN PAIRS RARE YEAST Performed at [facility], [address], [place],  [address]    Culture PENDING  Incomplete   Report Status PENDING  Incomplete  MRSA PCR Screening  Status: None   Collection Time: 12/28/16  3:50 PM  Result Value Ref Range Status   MRSA by PCR NEGATIVE NEGATIVE Final    Comment:        The GeneXpert MRSA Assay (FDA approved for NASAL specimens only), is one component of a comprehensive MRSA colonization surveillance program. It is not intended to diagnose MRSA infection nor to guide or monitor treatment for MRSA infections.    CT temporal bones:  IMPRESSION: 1. Fluid/opacification throughout the left EAC, left tympanic cavity, and mastoids. However, no associated bony erosion, mastoid coalescence, or other complicating features are identified. The appearance is compatible with combined Acute Otitis Media and Otitis Externa with mastoid effusion. Repeat temporal bone CT (per for bleed with IV contrast) recommended if the patient does not improve as expected 2. Normal right temporal bone. 3. Symmetric appearing nasal cavity mucosal thickening raising the possibility of rhinitis. Well pneumatized visible paranasal sinuses, probably with prior maxillary and ethmoid sinus surgery. 4. Small right parotid space nodule, probably a small benign salivary gland neoplasm.  Principle Diagnosis: Acute otitis media and otitis externa   Plan for today: 1. Continue antibiotics and ear drops. Can switch to oral antibiotics when cultures back. 2. Follow up cultures when available.   Anticipated discharge  date: 12/30/16 if cultures back.

## 2016-12-29 NOTE — Progress Notes (Signed)
12/29/2016 8:23 AM  [name], [name] Pulse [id number]  Hosp  Day 3    Temp:  [97.9 F (36.6 C)-98.4 F (36.9 C)] 98.4 F (36.9 C) (09/24 0524) Pulse Rate:  [62-74] 74 (09/24 0524) Resp:  [16-18] 18 (09/24 0524) BP: (121-132)/(66-78) 132/78 (09/24 0524) SpO2:  [98 %-100 %] 100 % (09/24 0524),     Intake/Output Summary (Last 24 hours) at 12/29/16 0823 Last data filed at 12/29/16 0515  Gross per 24 hour  Intake             1660 ml  Output             3100 ml  Net            -1440 ml    Results for orders placed or performed during the hospital encounter of 12/27/16 (from the past 24 hour(s))  Glucose, capillary     Status: Abnormal   Collection Time: 12/28/16 12:15 PM  Result Value Ref Range   Glucose-Capillary 159 (H) 65 - 99 mg/dL   Comment 1 Notify RN    Comment 2 Document in Chart   Aerobic Culture (superficial specimen)     Status: None (Preliminary result)   Collection Time: 12/28/16  3:20 PM  Result Value Ref Range   Specimen Description EAR    Special Requests NONE    Gram Stain      NO WBC SEEN RARE GRAM POSITIVE COCCI IN PAIRS RARE YEAST Performed at [facility], [place] [address] [place], [place] [address]    Culture PENDING    Report Status PENDING   MRSA PCR Screening     Status: None   Collection Time: 12/28/16  3:50 PM  Result Value Ref Range   MRSA by PCR NEGATIVE NEGATIVE  Glucose, capillary     Status: Abnormal   Collection Time: 12/28/16  4:04 PM  Result Value Ref Range   Glucose-Capillary 161 (H) 65 - 99 mg/dL   Comment 1 Notify RN    Comment 2 Document in Chart   Glucose, capillary     Status: Abnormal   Collection Time: 12/28/16  9:52 PM  Result Value Ref Range   Glucose-Capillary 198 (H) 65 - 99 mg/dL  Basic metabolic panel     Status: Abnormal   Collection Time: 12/29/16  3:42 AM  Result Value Ref Range   Sodium 141 135 - 145 mmol/L   Potassium 4.2 3.5 - 5.1 mmol/L   Chloride 108 101 - 111 mmol/L   CO2 26 22 - 32 mmol/L   Glucose,  Bld 153 (H) 65 - 99 mg/dL   BUN 15 6 - 20 mg/dL   Creatinine, Ser 0.86 0.44 - 1.00 mg/dL   Calcium 8.7 (L) 8.9 - 10.3 mg/dL   GFR calc non Af Amer >60 >60 mL/min   GFR calc Af Amer >60 >60 mL/min   Anion gap 7 5 - 15  CBC     Status: Abnormal   Collection Time: 12/29/16  3:42 AM  Result Value Ref Range   WBC 5.0 4.0 - 10.5 K/uL   RBC 3.92 3.87 - 5.11 MIL/uL   Hemoglobin 12.6 12.0 - 15.0 g/dL   HCT 35.7 (L) 36.0 - 46.0 %   MCV 91.1 78.0 - 100.0 fL   MCH 32.1 26.0 - 34.0 pg   MCHC 35.3 30.0 - 36.0 g/dL   RDW 11.4 (L) 11.5 - 15.5 %   Platelets 221 150 - 400 K/uL  Glucose, capillary  Status: Abnormal   Collection Time: 12/29/16  7:38 AM  Result Value Ref Range   Glucose-Capillary 147 (H) 65 - 99 mg/dL   Comment 1 Notify RN    Comment 2 Document in Chart     SUBJECTIVE:  Less pain, less drainage.  Hearing still muffled.  Still some dizziness with activity.  OBJECTIVE:  Energy, mood much better  IMPRESSION:  Possible strep per culture.  Nasal MRSA neg.  PLAN:  Await culture results.  Can move to po abx when cultures available.  Continue ear drops.  [name]

## 2016-12-30 ENCOUNTER — Ambulatory Visit: Payer: Medicare Other | Admitting: Family Medicine

## 2016-12-30 DIAGNOSIS — H6690 Otitis media, unspecified, unspecified ear: Secondary | ICD-10-CM

## 2016-12-30 LAB — GLUCOSE, CAPILLARY
Glucose-Capillary: 233 mg/dL — ABNORMAL HIGH (ref 65–99)
Glucose-Capillary: 148 mg/dL — ABNORMAL HIGH (ref 65–99)
Glucose-Capillary: 198 mg/dL — ABNORMAL HIGH (ref 65–99)
Glucose-Capillary: 203 mg/dL — ABNORMAL HIGH (ref 65–99)
Glucose-Capillary: 162 mg/dL — ABNORMAL HIGH (ref 65–99)

## 2016-12-30 MED ORDER — MECLIZINE HCL 25 MG PO TABS
12.5000 mg | ORAL_TABLET | Freq: Three times a day (TID) | ORAL | Status: DC
  Administered 2016-12-30 – 2016-12-31 (×2): 12.5 mg via ORAL
  Filled 2016-12-30 (×2): qty 1

## 2016-12-30 NOTE — Progress Notes (Signed)
Progress Note    [name]  WUJ:[id number] DOB: 20-Mar-1951  DOA: 12/27/2016 PCP: [place], Betty G, MD    Brief Narrative:   Chief complaint: Follow-up otitis media  Medical records reviewed and are as summarized below:  [name] is an 66 y.o. female the PMH of diabetes who was admitted 12/27/16 for otitis media but failed outpatient antibiotics.  Assessment/Plan:   Principal Problem:   Left ear Otitis media/External otitis Complicated by spontaneous rupture of the left eardrum associated with mastoid effusion ENT consulted and is following. Cultures obtained from the left ear canal. Continue Cortisporin suspension, cefepime, discontinue vancomycin since MRSA swab negative. Follow-up left ear canal cultures. Narrow antibiotics pending culture data.PT and add meclizine for dizziness.  Active Problems:   DM (diabetes mellitus), type 2 ([place]) Currently being managed with insulin sensitive SSI 3 times a day. CBGs well-controlled. Farxiga on hold.    HYPERCHOLESTEROLEMIA Continue Crestor.    GERD Continue PPI.    Essential hypertension Blood pressure controlled.   Family Communication/Anticipated D/C date and plan/Code Status   DVT prophylaxis: SCDs ordered. Code Status: Full Code.  Family Communication: No family at the bedside, declines my offer to call. Disposition Plan: Home awaiting culture   Medical Consultants:    ENT   Anti-Infectives:    Cefepime 12/27/16--->  Vancomycin 12/27/16---> 12/29/16  Subjective:   Complains about dizziness. Nausea and vomiting. No vision changes.  Objective:    Vitals:   12/29/16 1427 12/29/16 2056 12/30/16 0603 12/30/16 1400  BP: 131/74 (!) 141/72 130/66 137/67  Pulse: 71 66 60 76  Resp: 20 20 18 18   Temp: 97.8 F (36.6 C) 97.9 F (36.6 C) 97.7 F (36.5 C) 98.2 F (36.8 C)  TempSrc: Oral Oral Oral Oral  SpO2: 98% 100% 100% 96%  Weight:      Height:        Intake/Output Summary (Last 24  hours) at 12/30/16 1820 Last data filed at 12/30/16 1534  Gross per 24 hour  Intake             1321 ml  Output              700 ml  Net              621 ml   Filed Weights   12/27/16 1409 12/27/16 2151  Weight: 82.1 kg (181 lb) 83.5 kg (184 lb)    Exam: General: No acute distress. Cardiovascular: Heart sounds show a regular rate, and rhythm. No gallops or rubs. No murmurs. No JVD. Lungs: Clear to auscultation bilaterally with good air movement. No rales, rhonchi or wheezes. Abdomen: Soft, nontender, nondistended with normal active bowel sounds. No masses. No hepatosplenomegaly. Skin: Warm and dry. No rashes or lesions. Extremities: No clubbing or cyanosis. No edema. Pedal pulses 2+.  Data Reviewed:   I have personally reviewed following labs and imaging studies:  Microbiology 12/28/16: MRSA swab negative 12/28/16: Middle ear cultures  Procedures and diagnostic studies:  12/27/16: CT temporal bones: Combined acute otitis media and otitis externa with mastoid effusion.  Medications:   . DULoxetine  60 mg Oral q morning - 10a  . gabapentin  800 mg Oral QHS  . insulin aspart  0-9 Units Subcutaneous TID AC & HS  . neomycin-polymyxin-hydrocortisone  3 drop Left EAR Q6H  . pantoprazole  40 mg Oral Daily  . rosuvastatin  10 mg Oral q1800  . senna  1 tablet Oral BID  Continuous Infusions: . ceFEPime (MAXIPIME) IV Stopped (12/30/16 1617)     LOS: 2 days  Author:  Berle Mull, MD [facility] Pager: [phone] 12/30/2016 6:21 PM

## 2016-12-30 NOTE — Progress Notes (Signed)
Pt. Became became very upset and weepy as she is still experiencing double vision, dizziness and voices in her left ear. Tech got her up to the [facility] with great difficulty as she had an episode of dizziness and double vision.

## 2016-12-30 NOTE — Progress Notes (Signed)
Pharmacy Antibiotic Note  [name] is a 66 y.o. female with left ear pain and drainage admitted on 12/27/2016 with severe otitis externa in diabetic.  Pharmacy has been consulted for cefepime, vancomycin and acyclovir dosing. Probable otitis media per ENT with spontaneous rupture of the LEFT drum and mastoid effusion but not true clinical mastoiditis  Plan: Cefepim 2 Gm IV q8h Note patient failed Cefdinir as outpatient, covering for Pseudomonas per ENT  Height: 5\' 8"  (172.7 cm) Weight: 184 lb (83.5 kg) IBW/kg (Calculated) : 63.9  Temp (24hrs), Avg:97.8 F (36.6 C), Min:97.7 F (36.5 C), Max:97.9 F (36.6 C)   Recent Labs Lab 12/27/16 1511 12/28/16 0346 12/29/16 0342  WBC 7.4 5.7 5.0  CREATININE 0.81 0.95 0.86    Estimated Creatinine Clearance: 74.8 mL/min (by C-G formula based on SCr of 0.86 mg/dL).    Allergies  Allergen Reactions  . Dilaudid [Hydromorphone] Nausea And Vomiting and Other (See Comments)    Made the patient feel "spaced out," also  . Morphine And Related Itching  . Penicillins Rash    Has patient had a PCN reaction causing immediate rash, facial/tongue/throat swelling, SOB or lightheadedness with hypotension: Yes Has patient had a PCN reaction causing severe rash involving mucus membranes or skin necrosis: No Has patient had a PCN reaction that required hospitalization: No Has patient had a PCN reaction occurring within the last 10 years: No If all of the above answers are "NO", then may proceed with Cephalosporin use.    Antimicrobials this admission: 9/22 cefepime >>  9/22 vancomycin >> 9/24 9/22 acyclovir >> 9/23  Dose adjustments this admission:  Microbiology results: /23 ear Cx: rare GPC, rare yeast 9/23 MRSA PCR: neg  Thank you for allowing pharmacy to be a part of this patient's care.  Minda Ditto 12/30/2016 10:35 AM

## 2016-12-30 NOTE — Progress Notes (Signed)
Pt has been up to the [facility] 3 times   .She has experienced dizziness each time

## 2016-12-30 NOTE — Progress Notes (Signed)
12/30/2016 6:45 PM  [name], Jeanette Pulse [id number]  Hosp Day 4    Temp:  [97.7 F (36.5 C)-98.2 F (36.8 C)] 98.2 F (36.8 C) (09/25 1400) Pulse Rate:  [60-76] 76 (09/25 1400) Resp:  [18-20] 18 (09/25 1400) BP: (130-141)/(66-72) 137/67 (09/25 1400) SpO2:  [96 %-100 %] 96 % (09/25 1400),     Intake/Output Summary (Last 24 hours) at 12/30/16 1845 Last data filed at 12/30/16 1534  Gross per 24 hour  Intake             1321 ml  Output              700 ml  Net              621 ml    Results for orders placed or performed during the hospital encounter of 12/27/16 (from the past 24 hour(s))  Glucose, capillary     Status: Abnormal   Collection Time: 12/29/16  9:26 PM  Result Value Ref Range   Glucose-Capillary 233 (H) 65 - 99 mg/dL  Glucose, capillary     Status: Abnormal   Collection Time: 12/30/16  7:22 AM  Result Value Ref Range   Glucose-Capillary 148 (H) 65 - 99 mg/dL   Comment 1 Notify RN    Comment 2 Document in Chart   Glucose, capillary     Status: Abnormal   Collection Time: 12/30/16 11:45 AM  Result Value Ref Range   Glucose-Capillary 198 (H) 65 - 99 mg/dL   Comment 1 Notify RN    Comment 2 Document in Chart    Culture still pending.   SUBJECTIVE:  Still some dizziness without vertigo.  Able to get to the bathroom x 3 today.  Less ear pain.  Still muffled and with tinnitus AS.   OBJECTIVE:  Color, energy better.  Facial n intact .  No nystagmus.  IMPRESSION:  Improving.   PLAN:  Await final culture.  Move to po abx soon.  Since she lives by herself, may not be sufficiently ambulatory to go home tomorrow.  Recheck my office 1 week please.  Jodi Marble

## 2016-12-31 LAB — GLUCOSE, CAPILLARY
Glucose-Capillary: 115 mg/dL — ABNORMAL HIGH (ref 65–99)
Glucose-Capillary: 157 mg/dL — ABNORMAL HIGH (ref 65–99)
Glucose-Capillary: 156 mg/dL — ABNORMAL HIGH (ref 65–99)

## 2016-12-31 LAB — AEROBIC CULTURE  (SUPERFICIAL SPECIMEN)

## 2016-12-31 LAB — AEROBIC CULTURE W GRAM STAIN (SUPERFICIAL SPECIMEN): Gram Stain: NONE SEEN

## 2016-12-31 MED ORDER — KETOROLAC TROMETHAMINE 30 MG/ML IJ SOLN
30.0000 mg | Freq: Once | INTRAMUSCULAR | Status: DC
  Filled 2016-12-31: qty 1

## 2016-12-31 MED ORDER — AMOXICILLIN-POT CLAVULANATE 500-125 MG PO TABS: 1.0000 | ORAL_TABLET | Freq: Three times a day (TID) | ORAL | Status: DC

## 2016-12-31 MED ORDER — NEOMYCIN-POLYMYXIN-HC 3.5-10000-1 OT SUSP: 3.0000 [drp] | Freq: Four times a day (QID) | OTIC | 1 refills | Status: DC

## 2016-12-31 MED ORDER — MECLIZINE HCL 25 MG PO TABS: 25.0000 mg | ORAL_TABLET | Freq: Three times a day (TID) | ORAL | 0 refills | Status: DC

## 2016-12-31 MED ORDER — LEVOFLOXACIN 750 MG PO TABS
750.0000 mg | ORAL_TABLET | Freq: Every day | ORAL | Status: DC
  Administered 2016-12-31: 750 mg via ORAL
  Filled 2016-12-31: qty 1

## 2016-12-31 MED ORDER — MENTHOL 3 MG MT LOZG: 1.0000 | LOZENGE | OROMUCOSAL | 0 refills | Status: DC

## 2016-12-31 MED ORDER — DIPHENHYDRAMINE HCL 50 MG/ML IJ SOLN
12.5000 mg | Freq: Once | INTRAMUSCULAR | Status: AC
  Administered 2016-12-31: 12.5 mg via INTRAVENOUS
  Filled 2016-12-31: qty 1

## 2016-12-31 MED ORDER — MENTHOL 3 MG MT LOZG
1.0000 | LOZENGE | OROMUCOSAL | Status: DC
  Administered 2016-12-31 (×4): 3 mg via ORAL
  Filled 2016-12-31: qty 9

## 2016-12-31 MED ORDER — MECLIZINE HCL 25 MG PO TABS
25.0000 mg | ORAL_TABLET | Freq: Three times a day (TID) | ORAL | Status: DC
  Administered 2016-12-31: 25 mg via ORAL
  Filled 2016-12-31: qty 1

## 2016-12-31 MED ORDER — POLYETHYLENE GLYCOL 3350 17 G PO PACK: 17.0000 g | PACK | Freq: Every day | ORAL | 0 refills | Status: DC | PRN

## 2016-12-31 MED ORDER — PHENYLEPHRINE HCL 10 MG PO TABS
5.0000 mg | ORAL_TABLET | Freq: Once | ORAL | Status: DC
  Filled 2016-12-31: qty 1

## 2016-12-31 MED ORDER — DOXYCYCLINE HYCLATE 100 MG PO TABS: 100.0000 mg | ORAL_TABLET | Freq: Two times a day (BID) | ORAL | Status: DC

## 2016-12-31 MED ORDER — METOCLOPRAMIDE HCL 5 MG/ML IJ SOLN
5.0000 mg | Freq: Once | INTRAMUSCULAR | Status: AC
Start: 2016-12-31 — End: 2016-12-31
  Administered 2016-12-31: 5 mg via INTRAVENOUS
  Filled 2016-12-31: qty 2

## 2016-12-31 MED ORDER — LEVOFLOXACIN 750 MG PO TABS: 750.0000 mg | ORAL_TABLET | Freq: Every day | ORAL | 0 refills | Status: AC

## 2016-12-31 NOTE — Progress Notes (Signed)
PT recommendations discussed with pt. Resources given to pt for Vestibular Rehab. Pt informed she will need a MD script and can call to make an appointment if interested. RW order received and [facility] rep alerted of need for RW. Marney Doctor RN,BSN,NCM [phone]

## 2016-12-31 NOTE — Progress Notes (Signed)
D/c;d home with her friend via w/c. She verbalized  Understanding of instructions,

## 2016-12-31 NOTE — Progress Notes (Signed)
Pharmacy Antibiotic Note  [name] is a 66 y.o. female admitted on 12/27/2016 with otitis externa, media.  Pharmacy has been consulted for Levaquin dosing, plan discharge home today.  Plan: This patient's current antibiotics will be continued without adjustments.  Augmentin originally ordered, patient complaint nausea Changed to Levaquin 750mg  po daily  Height: 5\' 8"  (172.7 cm) Weight: 184 lb (83.5 kg) IBW/kg (Calculated) : 63.9  Temp (24hrs), Avg:98 F (36.7 C), Min:97.8 F (36.6 C), Max:98.3 F (36.8 C)   Recent Labs Lab 12/27/16 1511 12/28/16 0346 12/29/16 0342  WBC 7.4 5.7 5.0  CREATININE 0.81 0.95 0.86    Estimated Creatinine Clearance: 74.8 mL/min (by C-G formula based on SCr of 0.86 mg/dL).    Allergies  Allergen Reactions  . Dilaudid [Hydromorphone] Nausea And Vomiting and Other (See Comments)    Made the patient feel "spaced out," also  . Morphine And Related Itching  . Penicillins Rash    Has patient had a PCN reaction causing immediate rash, facial/tongue/throat swelling, SOB or lightheadedness with hypotension: Yes Has patient had a PCN reaction causing severe rash involving mucus membranes or skin necrosis: No Has patient had a PCN reaction that required hospitalization: No Has patient had a PCN reaction occurring within the last 10 years: No If all of the above answers are "NO", then may proceed with Cephalosporin use.    Antimicrobials this admission: 9/22 acyclovir >> 9/23 9/22 cefepime >> 9/26 9/22 vancomycin >> 9/24 9/26 Augmentin >> d/c 9/26 Levaquin po >>  Microbiology results: 9/23 ear Cx: Staph auricularis, rare yeast 9/23 MRSA PCR: neg  Thank you for allowing pharmacy to be a part of this patient's care.  Minda Ditto 12/31/2016 2:50 PM

## 2016-12-31 NOTE — Care Management Important Message (Signed)
impImportant Message  Patient Details  Name: [name] MRN: [id number] Date of Birth: 09/21/1950   Medicare Important Message Given:  Yes    Kerin Salen 12/31/2016, 11:18 AMImportant Message  Patient Details  Name: [name] MRN: [id number] Date of Birth: 1950-07-04   Medicare Important Message Given:  Yes    Kerin Salen 12/31/2016, 11:18 AM

## 2016-12-31 NOTE — Evaluation (Signed)
Physical Therapy Evaluation Patient Details Name: [name] MRN: [id number] DOB: [birth year] Today's Date: 12/31/2016   History of Present Illness  66 y.o. female with medical history significant of diabetes mellitus, hypertension hyperlipidemia PTSD and anxiety admitted with otitis media  Clinical Impression  Pt admitted with above diagnosis. Pt currently with functional limitations due to the deficits listed below (see PT Problem List). Pt ambulated 13' with hand held assist and IV pole, distance limited by onset of vertigo. MD stated he would increase meclizine dose. Outpatient vestibular rehab recommended if symptoms persist.  Pt will benefit from skilled PT to increase their independence and safety with mobility to allow discharge to the venue listed below.       Follow Up Recommendations Outpatient PT (outpatient vestibular PT)    Equipment Recommendations  Rolling walker with 5" wheels    Recommendations for Other Services       Precautions / Restrictions Precautions Precautions: Fall Precaution Comments: dizziness Restrictions Weight Bearing Restrictions: No      Mobility  Bed Mobility Overal bed mobility: Independent                Transfers Overall transfer level: Needs assistance   Transfers: Sit to/from Stand;Stand Pivot Transfers Sit to Stand: Min guard Stand pivot transfers: Min guard       General transfer comment: min/guard for safety 2* dizziness  Ambulation/Gait Ambulation/Gait assistance: Min assist Ambulation Distance (Feet): 35 Feet Assistive device: 1 person hand held assist (HHA of 1 plus IV pole) Gait Pattern/deviations: Shuffle;Decreased step length - right;Decreased step length - left Gait velocity: decr Gait velocity interpretation: Below normal speed for age/gender General Gait Details: distance limited by onset of dizziness, pt had meclizine prior to PT session (MD aware and stated he'd increase dose)  Stairs             Wheelchair Mobility    Modified Rankin (Stroke Patients Only)       Balance Overall balance assessment: Needs assistance   Sitting balance-Leahy Scale: Good       Standing balance-Leahy Scale: Fair                               Pertinent Vitals/Pain Pain Assessment: 0-10 Pain Score: 5  Pain Location: L ear Pain Descriptors / Indicators: Aching Pain Intervention(s): Limited activity within patient's tolerance;Monitored during session;Premedicated before session    Home Living Family/patient expects to be discharged to:: Private residence Living Arrangements: Alone Available Help at Discharge: Family;Friend(s);Available PRN/intermittently Type of Home: Apartment Home Access: Stairs to enter Entrance Stairs-Rails: Right Entrance Stairs-Number of Steps: 3 flights Home Layout: One level Home Equipment: None      Prior Function Level of Independence: Independent               Hand Dominance        Extremity/Trunk Assessment   Upper Extremity Assessment Upper Extremity Assessment: LUE deficits/detail LUE Deficits / Details: rotator cuff tear per pt, shoulder elevation to ~90* AROM    Lower Extremity Assessment Lower Extremity Assessment: Overall WFL for tasks assessed    Cervical / Trunk Assessment Cervical / Trunk Assessment: Normal  Communication   Communication: Other (comment) (lost hearing in left ear, but functions well)  Cognition Arousal/Alertness: Awake/alert Behavior During Therapy: WFL for tasks assessed/performed Overall Cognitive Status: Within Functional Limits for tasks assessed  General Comments      Exercises     Assessment/Plan    PT Assessment Patient needs continued PT services  PT Problem List Decreased mobility;Decreased balance;Decreased activity tolerance       PT Treatment Interventions Gait training;Patient/family education    PT Goals  (Current goals can be found in the Care Plan section)  Acute Rehab PT Goals Patient Stated Goal: pt likes to knit and paint PT Goal Formulation: With patient Time For Goal Achievement: 01/07/17 Potential to Achieve Goals: Good    Frequency Min 3X/week   Barriers to discharge        Co-evaluation               AM-PAC PT "6 Clicks" Daily Activity  Outcome Measure Difficulty turning over in bed (including adjusting bedclothes, sheets and blankets)?: None Difficulty moving from lying on back to sitting on the side of the bed? : None Difficulty sitting down on and standing up from a chair with arms (e.g., wheelchair, bedside commode, etc,.)?: A Little Help needed moving to and from a bed to chair (including a wheelchair)?: A Little Help needed walking in hospital room?: A Little Help needed climbing 3-5 steps with a railing? : A Little 6 Click Score: 20    End of Session Equipment Utilized During Treatment: Gait belt Activity Tolerance: Treatment limited secondary to medical complications (Comment) (dizziness) Patient left: in chair;with call bell/phone within reach Nurse Communication: Mobility status PT Visit Diagnosis: Unsteadiness on feet (R26.81);Dizziness and giddiness (R42)    Time: 1478-2956 PT Time Calculation (min) (ACUTE ONLY): 25 min   Charges:   PT Evaluation $PT Eval Low Complexity: 1 Low PT Treatments $Gait Training: 8-22 mins   PT G Codes:          [name] 12/31/2016, 11:17 AM [phone]

## 2017-01-02 ENCOUNTER — Other Ambulatory Visit (HOSPITAL_COMMUNITY): Payer: Medicare Other

## 2017-01-02 ENCOUNTER — Telehealth: Payer: Self-pay | Admitting: *Deleted

## 2017-01-02 NOTE — Telephone Encounter (Signed)
Unable to reach patient at time of TCM Call.  Voicemail full and unable to accept messages. Will attempt call back on Monday.

## 2017-01-05 NOTE — Telephone Encounter (Signed)
Unable to reach patient at time of TCM Call.  Voicemail full and unable to accept messages.

## 2017-01-05 NOTE — Discharge Summary (Signed)
[facility] Discharge Summary   Patient: [name] Demeter YNW:[id number]   PCP: Martinique, Betty G, MD DOB: 17-Aug-1950   Date of admission: 12/27/2016   Date of discharge: 12/31/2016    Discharge Diagnoses:  Principal Problem:   Otitis media Active Problems:   DM (diabetes mellitus), type 2 ([place])   HYPERCHOLESTEROLEMIA   GERD   Essential hypertension   External otitis of left ear   Dehydration   External otitis   Admitted From: home Disposition:  Home with outpatient therapy  Recommendations for Outpatient Follow-up:  1. follow-up with ENT in one week.   Follow-up Information    [facility] Follow up.   Why:  For vestibular rehab Contact information: [place] [place], [place], [place] [address]       Jodi Marble, MD. Schedule an appointment as soon as possible for a visit in 1 week(s).   Specialty:  Otolaryngology Contact information: [address] [place]  [address] [phone]          Diet recommendation: carb modified diet  Activity: The patient is advised to gradually reintroduce usual activities.  Discharge Condition: good  Code Status: full code  History of present illness: As per the H and P dictated on admission, "[name] is a 66 y.o. female with medical history significant of diabetes mellitus, hypertension hyperlipidemia PTSD and anxiety    Presented with aggressive left ear pain associated with nausea and sensation of being dizziness. This been going on for past 5 days Drainage of blood from the ear  Patient was seen in the office on September 19 was diagnosed with acute otitis externa treated with antibiotic ear drops Cipro and Cefdinir but couldn't tolerate it secondary to nausea and vomiting. Putting drops in her ears was also hurting so she stopped Patient had not had any fevers but not able to eat and drink well. Reports tinnitus feels that be sent her ear. She was  vomiting all day yesterday but today just a little sip of water. She reports that she felt a pop 4 days ago and it got worse. Patient was in severe pain when she presented to her primary care provider today crying out in agony and was sent to emergency department by EMS. Ear was suctioned was able to visualize TM which was erythematous no perforation year week wasn't placed and started Ciprodex. "  Hospital Course:  Summary of her active problems in the hospital is as following.  Left ear Otitis media/External otitis Complicated by spontaneous rupture of the left eardrum associated with mastoid effusion ENT consulted and is following. Cultures obtained from the left ear canal. Continue Cortisporin suspension, cefepime, discontinue vancomycin since MRSA swab negative.  Cultures from the ear canal are growing Staphylococcus auricularis( not to be confused with Staphylococcus aureus ) Antibiotic choice would be Levaquin. Outpatient PT and add meclizine for dizziness. not to drive until cleared by PCP or ENT  Active Problems:   DM (diabetes mellitus), type 2 ([place]) Currently being managed with insulin sensitive SSI 3 times a day. CBGs well-controlled. Farxiga on hold.    HYPERCHOLESTEROLEMIA Continue Crestor.    GERD Continue PPI.    Essential hypertension Blood pressure controlled.  All other chronic medical condition were stable during the hospitalization.  Patient was seen by physical therapy, outpatient PT, which was arranged by social worker and case Freight forwarder. On the day of the discharge the patient's vitals were stable, and no other acute  medical condition were reported by patient. the patient was felt safe to be discharge at home with therapy.  Procedures and Results:  none   Consultations:  ENT  DISCHARGE MEDICATION: Discharge Medication List as of 12/31/2016  4:17 PM    START taking these medications   Details  levofloxacin (LEVAQUIN) 750 MG tablet Take 1 tablet (750 mg  total) by mouth daily., Starting Thu 01/01/2017, Until Mon 01/05/2017, Normal    meclizine (ANTIVERT) 25 MG tablet Take 1 tablet (25 mg total) by mouth 3 (three) times daily., Starting Wed 12/31/2016, Normal    menthol-cetylpyridinium (CEPACOL) 3 MG lozenge Take 1 lozenge (3 mg total) by mouth every 2 (two) hours., Starting Wed 12/31/2016, Normal    neomycin-polymyxin-hydrocortisone (CORTISPORIN) 3.5-10000-1 OTIC suspension Place 3 drops into the left ear every 6 (six) hours., Starting Wed 12/31/2016, Normal    polyethylene glycol (MIRALAX / GLYCOLAX) packet Take 17 g by mouth daily as needed for mild constipation., Starting Wed 12/31/2016, Normal      CONTINUE these medications which have NOT CHANGED   Details  Biotin 10000 MCG TABS Take 10,000 mcg by mouth 2 (two) times a week., Historical Med    clonazePAM (KLONOPIN) 1 MG tablet Take 1 mg by mouth 2 (two) times daily as needed for anxiety., Historical Med    dapagliflozin propanediol (FARXIGA) 10 MG TABS tablet Take 10 mg by mouth daily., Starting Mon 11/24/2016, Normal    diclofenac sodium (VOLTAREN) 1 % GEL Apply 2-4 g topically 4 (four) times daily., Historical Med    DULoxetine (CYMBALTA) 30 MG capsule Take 60 mg by mouth every morning., Starting Tue 11/18/2016, Historical Med    gabapentin (NEURONTIN) 400 MG capsule Take 800 mg by mouth at bedtime. , Starting Fri 03/07/2016, Historical Med    hydrochlorothiazide (HYDRODIURIL) 25 MG tablet Take 1 tablet (25 mg total) by mouth daily., Starting Tue 10/21/2016, Normal    HYDROcodone-acetaminophen (NORCO/VICODIN) 5-325 MG tablet Take 1 tablet by mouth every 8 (eight) hours as needed for moderate pain., Starting Mon 11/17/2016, Print    linaclotide (LINZESS) 290 MCG CAPS capsule Take 1 capsule (290 mcg total) by mouth daily before breakfast., Starting Tue 12/23/2016, Normal    Melatonin 10 MG TABS Take 10 mg by mouth at bedtime., Historical Med    multivitamin-lutein (OCUVITE-LUTEIN) CAPS  capsule Take 1 capsule by mouth daily., Historical Med    omeprazole (PRILOSEC) 40 MG capsule Take 1 capsule (40 mg total) by mouth daily., Starting Tue 11/18/2016, Normal    potassium chloride SA (K-DUR,KLOR-CON) 20 MEQ tablet Take 1 tablet (20 mEq total) by mouth daily., Starting Tue 12/16/2016, Normal    rosuvastatin (CRESTOR) 10 MG tablet Take 10 mg by mouth daily. , Starting Tue 08/07/2015, Historical Med       Allergies  Allergen Reactions  . Dilaudid [Hydromorphone] Nausea And Vomiting and Other (See Comments)    Made the patient feel "spaced out," also  . Morphine And Related Itching  . Penicillins Rash    Has patient had a PCN reaction causing immediate rash, facial/tongue/throat swelling, SOB or lightheadedness with hypotension: Yes Has patient had a PCN reaction causing severe rash involving mucus membranes or skin necrosis: No Has patient had a PCN reaction that required hospitalization: No Has patient had a PCN reaction occurring within the last 10 years: No If all of the above answers are "NO", then may proceed with Cephalosporin use.    Discharge Instructions    Diet - low sodium heart healthy  Complete by:  As directed    Discharge instructions    Complete by:  As directed    It is important that you read following instructions as well as go over your medication list with RN to help you understand your care after this hospitalization.  Discharge Instructions: Please follow-up with PCP in one week  Please request your primary care physician to go over all Hospital Tests and Procedure/Radiological results at the follow up,  Please get all Hospital records sent to your PCP by signing hospital release before you go home.   Do not take more than prescribed Pain, Sleep and Anxiety Medications. You were cared for by a hospitalist during your hospital stay. If you have any questions about your discharge medications or the care you received while you were in the hospital  after you are discharged, you can call the unit and ask to speak with the hospitalist on call if the hospitalist that took care of you is not available.  Once you are discharged, your primary care physician will handle any further medical issues. Please note that NO REFILLS for any discharge medications will be authorized once you are discharged, as it is imperative that you return to your primary care physician (or establish a relationship with a primary care physician if you do not have one) for your aftercare needs so that they can reassess your need for medications and monitor your lab values. You Must read complete instructions/literature along with all the possible adverse reactions/side effects for all the Medicines you take and that have been prescribed to you. Take any new Medicines after you have completely understood and accept all the possible adverse reactions/side effects. Wear Seat belts while driving. If you have smoked or chewed Tobacco in the last 2 yrs please stop smoking and/or stop any Recreational drug use.   Increase activity slowly    Complete by:  As directed      Discharge Exam: Filed Weights   12/27/16 1409 12/27/16 2151  Weight: 82.1 kg (181 lb) 83.5 kg (184 lb)   Vitals:   12/31/16 0918 12/31/16 1440  BP: 127/65 130/70  Pulse: 70 73  Resp: 18 18  Temp: 98.3 F (36.8 C) 97.6 F (36.4 C)  SpO2: 97% 99%   General: Appear in no distress, no Rash; Oral Mucosa moist. Cardiovascular: S1 and S2 Present, no Murmur, no JVD Respiratory: Bilateral Air entry present and Clear to Auscultation, no Crackles, no wheezes Abdomen: Bowel Sound present, Soft and no tenderness Extremities: no Pedal edema, no calf tenderness Neurology: Grossly no focal neuro deficit.  The results of significant diagnostics from this hospitalization (including imaging, microbiology, ancillary and laboratory) are listed below for reference.    Significant Diagnostic Studies: Ct Temporal Bones  Wo Contrast  Result Date: 12/27/2016 CLINICAL DATA:  66 year old female who was taking antibiotics for ear infection, but stopped due to progressed dizziness and vomiting. Otitis externa, mastoiditis. EXAM: CT TEMPORAL BONES WITHOUT CONTRAST TECHNIQUE: Axial and coronal plane CT imaging of the petrous temporal bones was performed with thin-collimation image reconstruction. No intravenous contrast was administered. Multiplanar CT image reconstructions were also generated. COMPARISON:  Head CT without contrast 06/16/2014. FINDINGS: Stable and negative visualized noncontrast brain parenchyma. Visualized orbits and scalp soft tissues are within normal limits. Negative visualized noncontrast deep soft tissue spaces of the face. In the superficial lobe of the right parotid gland there is an 8 mm rounded soft tissue nodule which measured 5 mm in 2016. Negative right parotid  space otherwise. Normal CT appearance of the right stylomastoid foramen. In general skullbase mineralization is within normal limits. Calcified atherosclerosis at the skull base. The paranasal sinuses are well pneumatized, with probable postoperative changes to the the bilateral maxillary and ethmoid sinuses. There is symmetric appearing nasal cavity mucosal thickening (series 6, image 15) with protrusion of the inferior terminates through medial maxillary wall defects. Right temporal bone: Mild debris in the right external auditory canal. The right tympanic membrane, tympanic cavity, and ossicles. The right mastoids are well pneumatized. Right IAC, cochlea, vestibule and vestibular aqueduct are within normal limits. Mildly high riding right IJ bulb (normal variant series 7, image 95). Semicircular canals and course of the right seventh nerve are within normal limits. Left temporal bone: Diffuse fluid and/or soft tissue thickening throughout the left external auditory canal which is largely opacified (series 8, image 96). The left tympanic membrane is  not evident. There is opacification throughout the left tympanic cavity including surrounding the left ossicles. Still, the left scutum and ossicles appear to remain intact. The left mastoid antrum is opacified along with the majority of the left mastoid air cells. Air cell fluid levels are noted. No mastoid coalescence is identified. The left IAC, cochlea, vestibule and vestibular aqueduct are within normal limits. Left semicircular canals are within normal limits. The left pinna does not appear enlarged or swollen. IMPRESSION: 1. Fluid/opacification throughout the left EAC, left tympanic cavity, and mastoids. However, no associated bony erosion, mastoid coalescence, or other complicating features are identified. The appearance is compatible with combined Acute Otitis Media and Otitis Externa with mastoid effusion. Repeat temporal bone CT (per for bleed with IV contrast) recommended if the patient does not improve as expected 2. Normal right temporal bone. 3. Symmetric appearing nasal cavity mucosal thickening raising the possibility of rhinitis. Well pneumatized visible paranasal sinuses, probably with prior maxillary and ethmoid sinus surgery. 4. Small right parotid space nodule, probably a small benign salivary gland neoplasm. Electronically Signed   By: Genevie Ann M.D.   On: 12/27/2016 16:25    Microbiology: Recent Results (from the past 240 hour(s))  Aerobic Culture (superficial specimen)     Status: None   Collection Time: 12/28/16  3:20 PM  Result Value Ref Range Status   Specimen Description EAR  Final   Special Requests NONE  Final   Gram Stain   Final    NO WBC SEEN RARE GRAM POSITIVE COCCI IN PAIRS RARE YEAST Performed at [facility], [address], [place], [place] [address]    Culture FEW STAPHYLOCOCCUS AURICULARIS  Final   Report Status 12/31/2016 FINAL  Final   Organism ID, Bacteria STAPHYLOCOCCUS AURICULARIS  Final      Susceptibility   Staphylococcus auricularis - MIC*     CIPROFLOXACIN <=0.5 SENSITIVE Sensitive     ERYTHROMYCIN <=0.25 SENSITIVE Sensitive     GENTAMICIN <=0.5 SENSITIVE Sensitive     OXACILLIN <=0.25 SENSITIVE Sensitive     TETRACYCLINE <=1 SENSITIVE Sensitive     VANCOMYCIN <=0.5 SENSITIVE Sensitive     TRIMETH/SULFA <=10 SENSITIVE Sensitive     CLINDAMYCIN <=0.25 SENSITIVE Sensitive     RIFAMPIN <=0.5 SENSITIVE Sensitive     Inducible Clindamycin NEGATIVE Sensitive     * FEW STAPHYLOCOCCUS AURICULARIS  MRSA PCR Screening     Status: None   Collection Time: 12/28/16  3:50 PM  Result Value Ref Range Status   MRSA by PCR NEGATIVE NEGATIVE Final    Comment:  The GeneXpert MRSA Assay (FDA approved for NASAL specimens only), is one component of a comprehensive MRSA colonization surveillance program. It is not intended to diagnose MRSA infection nor to guide or monitor treatment for MRSA infections.      Recent Labs Lab 12/30/16 1734 12/30/16 2157 12/31/16 0728 12/31/16 1154 12/31/16 1646  GLUCAP 203* 162* 115* 157* 156*   Time spent: 35 minutes  Signed:  Chidubem Chaires  [facility] 12/31/2016  8:01 AM

## 2017-01-06 ENCOUNTER — Encounter: Payer: Medicare Other | Admitting: Gastroenterology

## 2017-01-06 NOTE — Telephone Encounter (Signed)
Unable to reach patient at time of TCM Call.  Voicemail full and unable to accept messages.

## 2017-01-07 ENCOUNTER — Other Ambulatory Visit: Payer: Medicare Other

## 2017-01-07 ENCOUNTER — Other Ambulatory Visit: Payer: Self-pay | Admitting: Family Medicine

## 2017-01-07 NOTE — Telephone Encounter (Signed)
Attempted to reach patient to complete TCM follow-up call. Unable to reach patient. Message left on voicemail asking patient to return call to office.   

## 2017-01-08 DIAGNOSIS — H9042 Sensorineural hearing loss, unilateral, left ear, with unrestricted hearing on the contralateral side: Secondary | ICD-10-CM | POA: Diagnosis not present

## 2017-01-08 DIAGNOSIS — H66005 Acute suppurative otitis media without spontaneous rupture of ear drum, recurrent, left ear: Secondary | ICD-10-CM | POA: Diagnosis not present

## 2017-01-09 NOTE — Telephone Encounter (Signed)
Patient called requesting her clonazepam to be refilled Please advise [phone]   Kristopher Oppenheim at [facility]

## 2017-01-09 NOTE — Telephone Encounter (Signed)
Multiple attempts have been made to contact patient to complete TCM call and schedule follow-up. No return phone call from patient.   Dr. Martinique - FYI. Thanks!

## 2017-01-10 ENCOUNTER — Encounter: Payer: Self-pay | Admitting: Family Medicine

## 2017-01-12 ENCOUNTER — Telehealth: Payer: Self-pay | Admitting: Family Medicine

## 2017-01-12 ENCOUNTER — Other Ambulatory Visit: Payer: Self-pay | Admitting: Registered Nurse

## 2017-01-12 ENCOUNTER — Telehealth: Payer: Self-pay

## 2017-01-12 ENCOUNTER — Telehealth: Payer: Self-pay | Admitting: Gastroenterology

## 2017-01-12 DIAGNOSIS — G894 Chronic pain syndrome: Secondary | ICD-10-CM

## 2017-01-12 NOTE — Telephone Encounter (Signed)
Patient called stating that she had an appointment in September and had to cancel because she was in the hospital. She said that she wouldn't be seen again until October 29 and that she is running out of her hydrocodone and needs a refill.

## 2017-01-12 NOTE — Telephone Encounter (Signed)
A month supply can be called in and she needs f/u appt for anxiety. Thanks, BJ

## 2017-01-12 NOTE — Telephone Encounter (Signed)
Pt need new Rx for clonazepam   Pharm:  [facility] [address].

## 2017-01-12 NOTE — Telephone Encounter (Signed)
Rx has already been called into the pharmacy earlier today.

## 2017-01-12 NOTE — Progress Notes (Signed)
HPI:   [name] is a 66 y.o. female, who is here today to follow on recent hospitalization. She is also following on depression and anxiety.    I saw her on 12/24/16 ,when she was c/o earache. She was started on oral and topical abx. She did not tolerate oral abx, nausea and vomiting, seen at [facility] (Saturday clinic) and referred to the ER.  She was hospitalized from on 12/27/16 to 12/31/16 because otitis externa, otitis media, and dehydration. Started on IV antibiotic, Cefepime. She was referred to [facility] for vestibular rehab.  Lab Results  Component Value Date   WBC 5.0 12/29/2016   HGB 12.6 12/29/2016   HCT 35.7 (L) 12/29/2016   MCV 91.1 12/29/2016   PLT 221 12/29/2016   Culture of the left ear canal grew Staphylococcus auricularis.  CT of temporal bones 12/27/16: 1. Fluid/opacification throughout the left EAC, left tympanic cavity, and mastoids. However, no associated bony erosion, mastoid coalescence, or other complicating features are identified. The appearance is compatible with combined Acute Otitis Media and Otitis Externa with mastoid effusion. 2. Normal right temporal bone. 3. Symmetric appearing nasal cavity mucosal thickening raising the possibility of rhinitis. Well pneumatized visible paranasal sinuses, probably with prior maxillary and ethmoid sinus surgery. 4. Small right parotid space nodule, probably a small benign salivary gland neoplasm.  She was discharged on Levaquin 750 mg daily for 5 days, meclizine 25 mg 3 times a day, and Corticosporin otic drops. The latter one she still using.  She already followed with Dr Erik Obey on 55/7/32 and has a f/u in 8 weeks.  Residual hearing loss left ear, recommended autoinflation maneuvers. Tinnitus has improved.  Dizziness is a "lot of better"   She has echo pending tomorrow as part of her work-up for chest discomfort. GI appt also pending, according to pt, provider is waiting for  colonoscopy until echo results are available.  History of severe constipation, she has used enemas a few times in the past few days and also given in the hospital. + Straining, dyschezia, no hematochezia recently.Hx of hemorrhoids. She is currently on Miralax and Linzess 290 mcg. Chronic nausea and vomiting, Hx of gastroparesis. She has not had symptoms since hospital discharge.   Anxiety and depression:  Last F/U in 11/2016.  She is currently on Clonazepam 1 mg bid as needed, she has been on benzo since 2016. She is also on Cymbalta 60 mg daily, she has been weaning it off and now on Cymbalta 30 mg every other day. This medication was prescribed for depression.  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  She takes Gabapentin for RLS. Cramps on LE's, intermittently. Episodes wake her up for 3-4 years, did not get better with K+ supplementation. She denies LE edema or erythema. Stable overall.  Hx of chronic pain, she has an appt tomorrow with pain management. Planning on left shoulder injection. She also would like to to wean off Hydrocodone   DM II:  Dx in 2014. Currently she is on Farxiga 10 mg daily.  Lab Results  Component Value Date   HGBA1C 7.1 (H) 12/28/2016   Last eye exam: A year ago. BS's: FG 150's. She is doing better with her diet, following low carb diet.  Denies abdominal pain, nausea,vomiting, polydipsia,polyuria, or polyphagia.   Review of Systems  Constitutional: Positive for fatigue. Negative for appetite change, chills and fever.  HENT: Positive for hearing loss. Negative for ear  discharge, ear pain, facial swelling, mouth sores, nosebleeds and trouble swallowing.   Eyes: Negative for redness and visual disturbance.  Respiratory: Negative for cough, shortness of breath and wheezing.   Cardiovascular: Negative for palpitations and leg swelling.  Gastrointestinal: Positive for constipation. Negative for abdominal pain, nausea and  vomiting.       Negative for changes in bowel habits.  Endocrine: Negative for polydipsia, polyphagia and polyuria.  Genitourinary: Negative for decreased urine volume, difficulty urinating, dysuria and hematuria.  Musculoskeletal: Positive for arthralgias, back pain and myalgias. Negative for gait problem.  Skin: Negative for rash and wound.  Neurological: Negative for syncope, weakness and headaches.  Psychiatric/Behavioral: Negative for confusion. The patient is nervous/anxious.       Current Outpatient Prescriptions on File Prior to Visit  Medication Sig Dispense Refill  . Biotin 10000 MCG TABS Take 10,000 mcg by mouth 2 (two) times a week.    . dapagliflozin propanediol (FARXIGA) 10 MG TABS tablet Take 10 mg by mouth daily. 90 tablet 2  . diclofenac sodium (VOLTAREN) 1 % GEL Apply 2-4 g topically 4 (four) times daily.    . hydrochlorothiazide (HYDRODIURIL) 25 MG tablet Take 1 tablet (25 mg total) by mouth daily. 90 tablet 1  . HYDROcodone-acetaminophen (NORCO/VICODIN) 5-325 MG tablet Take 1 tablet by mouth every 8 (eight) hours as needed for moderate pain. 80 tablet 0  . linaclotide (LINZESS) 290 MCG CAPS capsule Take 1 capsule (290 mcg total) by mouth daily before breakfast. 30 capsule 6  . meclizine (ANTIVERT) 25 MG tablet Take 1 tablet (25 mg total) by mouth 3 (three) times daily. 30 tablet 0  . Melatonin 10 MG TABS Take 10 mg by mouth at bedtime.    Marland Kitchen menthol-cetylpyridinium (CEPACOL) 3 MG lozenge Take 1 lozenge (3 mg total) by mouth every 2 (two) hours. 100 tablet 0  . multivitamin-lutein (OCUVITE-LUTEIN) CAPS capsule Take 1 capsule by mouth daily.    Marland Kitchen neomycin-polymyxin-hydrocortisone (CORTISPORIN) 3.5-10000-1 OTIC suspension Place 3 drops into the left ear every 6 (six) hours. 10 mL 1  . omeprazole (PRILOSEC) 40 MG capsule Take 1 capsule (40 mg total) by mouth daily. 90 capsule 3  . polyethylene glycol (MIRALAX / GLYCOLAX) packet Take 17 g by mouth daily as needed for mild  constipation. 14 each 0  . potassium chloride SA (K-DUR,KLOR-CON) 20 MEQ tablet Take 1 tablet (20 mEq total) by mouth daily. 90 tablet 3  . rosuvastatin (CRESTOR) 10 MG tablet Take 10 mg by mouth daily.      No current facility-administered medications on file prior to visit.      Past Medical History:  Diagnosis Date  . Anxiety   . Arthritis   . Diabetes mellitus without complication ([place])   . Hyperlipidemia   . Hypertension   . PTSD (post-traumatic stress disorder)    Allergies  Allergen Reactions  . Dilaudid [Hydromorphone] Nausea And Vomiting and Other (See Comments)    Made the patient feel "spaced out," also  . Morphine And Related Itching  . Penicillins Rash    Has patient had a PCN reaction causing immediate rash, facial/tongue/throat swelling, SOB or lightheadedness with hypotension: Yes Has patient had a PCN reaction causing severe rash involving mucus membranes or skin necrosis: No Has patient had a PCN reaction that required hospitalization: No Has patient had a PCN reaction occurring within the last 10 years: No If all of the above answers are "NO", then may proceed with Cephalosporin use.     Social  History   Social History  . Marital status: Divorced    Spouse name: N/A  . Number of children: N/A  . Years of education: N/A   Social History Main Topics  . Smoking status: Never Smoker  . Smokeless tobacco: Never Used  . Alcohol use No  . Drug use: No  . Sexual activity: Not Asked   Other Topics Concern  . None   Social History Narrative  . None    Vitals:   01/13/17 1420  BP: 116/76  Pulse: 86  Resp: 12  SpO2: 97%   Body mass index is 28.28 kg/m.   Physical Exam  Nursing note and vitals reviewed. Constitutional: She is oriented to person, place, and time. She appears well-developed. No distress.  HENT:  Head: Normocephalic and atraumatic.  Right Ear: Tympanic membrane, external ear and ear canal normal.  Left Ear: No swelling or  tenderness. No mastoid tenderness. Tympanic membrane mobility is abnormal.  Ears:  Mouth/Throat: Oropharynx is clear and moist and mucous membranes are normal.  Left TM is not erythematous,novesicular lesions. Peripherally ? retraction and mildly injected.Ear canal no edema,erythema, or debris.  Eyes: Pupils are equal, round, and reactive to light. Conjunctivae are normal.  Cardiovascular: Normal rate and regular rhythm.   No murmur heard. Pulses:      Dorsalis pedis pulses are 2+ on the right side, and 2+ on the left side.  Respiratory: Effort normal and breath sounds normal. No respiratory distress.  GI: Soft. She exhibits no mass. There is no hepatomegaly. There is no tenderness.  Musculoskeletal: She exhibits no edema.       Lumbar back: She exhibits tenderness. She exhibits no bony tenderness.  Lower back pain elicited with movement on exam table.  Lymphadenopathy:    She has no cervical adenopathy.  Neurological: She is alert and oriented to person, place, and time. She has normal strength. Coordination and gait normal.  Skin: Skin is warm. No rash noted. No erythema.  Psychiatric: Her mood appears anxious.  Fairly groomed, good eye contact.    ASSESSMENT AND PLAN:   [name] was seen today for hospitalization follow-up.  Diagnoses and all orders for this visit:  Malignant otitis externa of left ear, unspecified chronicity  Resolved. Complete topical abx treatment. Continue following with ENT prn.  Hearing loss of left ear, unspecified hearing loss type  Residual after otitis medica and improving. Dr Dagmar Hait recommended annual hearing test. Keep next F/U appt,  Type 2 diabetes mellitus with complication, without long-term current use of insulin ([place])  HgA1C slightly above at goal. No changes in current management. Regular exercise and healthy diet with avoidance of added sugar food intake is an important part of treatment and recommended. Annual eye exam,  periodic dental and foot care recommended. F/U in 3-4 months  Anxiety and depression  Stable anxiety, no changes in Clonazepam. Side effects of medication discussed. In regard to depression, she is already weaning of Cymbalta. Instructed about warning signs and instructed to let me know if depression symptoms get worse, in which case we will resume Cymbalta. Follow-up in 3-4 months, before if needed.  -     clonazePAM (KLONOPIN) 1 MG tablet; TAKE ONE TABLET BY MOUTH TWICE A DAY AS NEEDED FOR ANXIETY  Restless leg syndrome  Stable. No changes in Gabapentin dose. Follow-up in 6-12 months.  -     gabapentin (NEURONTIN) 400 MG capsule; Take 2 capsules (800 mg total) by mouth at bedtime.  Constipation, unspecified constipation  type  Not well controlled.  Hydrocodone most likely aggravate problem,thinking about weaning it off.  Continue high fiber intake, adequate hydration, Linzess, and OTC MiraLAX. She will keep appointment with GI.  Need for pneumococcal vaccination -     Pneumococcal conjugate vaccine 13-valent  Healthcare maintenance -     Zoster Vac Recomb Adjuvanted [employer]) injection; Inject 0.5 mLs into the muscle every 3 (three) months. -     Pneumococcal conjugate vaccine 13-valent     Betty G. Martinique, MD  [facility]. [place] office.

## 2017-01-12 NOTE — Telephone Encounter (Signed)
Rx phoned in.   

## 2017-01-12 NOTE — Telephone Encounter (Signed)
Last filled 11/26/16.  Okay to refill?

## 2017-01-13 ENCOUNTER — Ambulatory Visit (INDEPENDENT_AMBULATORY_CARE_PROVIDER_SITE_OTHER): Payer: Medicare Other | Admitting: Family Medicine

## 2017-01-13 ENCOUNTER — Encounter: Payer: Self-pay | Admitting: Family Medicine

## 2017-01-13 VITALS — BP 116/76 | HR 86 | Resp 12 | Ht 68.0 in | Wt 186.0 lb

## 2017-01-13 DIAGNOSIS — F329 Major depressive disorder, single episode, unspecified: Secondary | ICD-10-CM | POA: Diagnosis not present

## 2017-01-13 DIAGNOSIS — F419 Anxiety disorder, unspecified: Secondary | ICD-10-CM | POA: Diagnosis not present

## 2017-01-13 DIAGNOSIS — F32A Depression, unspecified: Secondary | ICD-10-CM

## 2017-01-13 DIAGNOSIS — G2581 Restless legs syndrome: Secondary | ICD-10-CM

## 2017-01-13 DIAGNOSIS — K5900 Constipation, unspecified: Secondary | ICD-10-CM

## 2017-01-13 DIAGNOSIS — E118 Type 2 diabetes mellitus with unspecified complications: Secondary | ICD-10-CM | POA: Diagnosis not present

## 2017-01-13 DIAGNOSIS — Z23 Encounter for immunization: Secondary | ICD-10-CM | POA: Diagnosis not present

## 2017-01-13 DIAGNOSIS — Z0000 Encounter for general adult medical examination without abnormal findings: Secondary | ICD-10-CM

## 2017-01-13 DIAGNOSIS — Z8669 Personal history of other diseases of the nervous system and sense organs: Secondary | ICD-10-CM

## 2017-01-13 DIAGNOSIS — H6022 Malignant otitis externa, left ear: Secondary | ICD-10-CM

## 2017-01-13 DIAGNOSIS — H9192 Unspecified hearing loss, left ear: Secondary | ICD-10-CM

## 2017-01-13 MED ORDER — GABAPENTIN 400 MG PO CAPS: 800.0000 mg | ORAL_CAPSULE | Freq: Every day | ORAL | 4 refills | Status: DC

## 2017-01-13 MED ORDER — CLONAZEPAM 1 MG PO TABS: ORAL_TABLET | ORAL | 0 refills | Status: DC

## 2017-01-13 MED ORDER — ZOSTER VAC RECOMB ADJUVANTED 50 MCG/0.5ML IM SUSR: 0.5000 mL | INTRAMUSCULAR | 1 refills | Status: DC

## 2017-01-13 NOTE — Telephone Encounter (Signed)
appt made for 01/14/2017 with [name]

## 2017-01-13 NOTE — Telephone Encounter (Signed)
Gave patient a call to get her schedule with NP for med refill. Patient Voicemail is full and can not accept any new messages .

## 2017-01-13 NOTE — Telephone Encounter (Signed)
Patient says she has been in the hospital again. She thinks she is constipated. She has used 2 enemas together. Reports "very little outcome." She takes daily Linzess and drinks one glass of Miralax daily. Declines to increase the Miralax stating she can "barely get one glass down." Her back is sore. Hurts to get in and out of a car. She hurts if she tries to lie down on her furniture (her words). She has her echocardiogram tomorrow.  She then states she is not sure why she called. She thanks me for my call.

## 2017-01-13 NOTE — Patient Instructions (Signed)
A few things to remember from today's visit:   Type 2 diabetes mellitus with complication, without long-term current use of insulin (HCC)  Anxiety and depression  Restless leg syndrome  Constipation, unspecified constipation type  Hopefully constipation would improve when Hydrocodone is discontinued.  You can schedule your routine preventive care.    Please be sure medication list is accurate. If a new problem present, please set up appointment sooner than planned today.

## 2017-01-14 ENCOUNTER — Other Ambulatory Visit: Payer: Self-pay

## 2017-01-14 ENCOUNTER — Encounter: Payer: Self-pay | Admitting: Registered Nurse

## 2017-01-14 ENCOUNTER — Encounter: Payer: Medicare Other | Attending: Physical Medicine & Rehabilitation | Admitting: Registered Nurse

## 2017-01-14 ENCOUNTER — Ambulatory Visit (HOSPITAL_COMMUNITY): Payer: Medicare Other | Attending: Cardiology

## 2017-01-14 ENCOUNTER — Telehealth: Payer: Self-pay | Admitting: Registered Nurse

## 2017-01-14 VITALS — BP 117/78 | HR 73

## 2017-01-14 DIAGNOSIS — Z79899 Other long term (current) drug therapy: Secondary | ICD-10-CM

## 2017-01-14 DIAGNOSIS — E785 Hyperlipidemia, unspecified: Secondary | ICD-10-CM | POA: Insufficient documentation

## 2017-01-14 DIAGNOSIS — M75102 Unspecified rotator cuff tear or rupture of left shoulder, not specified as traumatic: Secondary | ICD-10-CM

## 2017-01-14 DIAGNOSIS — G8929 Other chronic pain: Secondary | ICD-10-CM | POA: Insufficient documentation

## 2017-01-14 DIAGNOSIS — F329 Major depressive disorder, single episode, unspecified: Secondary | ICD-10-CM | POA: Insufficient documentation

## 2017-01-14 DIAGNOSIS — M47812 Spondylosis without myelopathy or radiculopathy, cervical region: Secondary | ICD-10-CM

## 2017-01-14 DIAGNOSIS — Z5181 Encounter for therapeutic drug level monitoring: Secondary | ICD-10-CM | POA: Diagnosis not present

## 2017-01-14 DIAGNOSIS — G2581 Restless legs syndrome: Secondary | ICD-10-CM | POA: Insufficient documentation

## 2017-01-14 DIAGNOSIS — R0789 Other chest pain: Secondary | ICD-10-CM | POA: Diagnosis not present

## 2017-01-14 DIAGNOSIS — T1490XA Injury, unspecified, initial encounter: Secondary | ICD-10-CM | POA: Insufficient documentation

## 2017-01-14 DIAGNOSIS — F4310 Post-traumatic stress disorder, unspecified: Secondary | ICD-10-CM | POA: Diagnosis not present

## 2017-01-14 DIAGNOSIS — R102 Pelvic and perineal pain: Secondary | ICD-10-CM | POA: Insufficient documentation

## 2017-01-14 DIAGNOSIS — M19012 Primary osteoarthritis, left shoulder: Secondary | ICD-10-CM

## 2017-01-14 DIAGNOSIS — M542 Cervicalgia: Secondary | ICD-10-CM | POA: Diagnosis not present

## 2017-01-14 DIAGNOSIS — M25512 Pain in left shoulder: Secondary | ICD-10-CM | POA: Diagnosis not present

## 2017-01-14 DIAGNOSIS — R0600 Dyspnea, unspecified: Secondary | ICD-10-CM

## 2017-01-14 DIAGNOSIS — M545 Low back pain: Secondary | ICD-10-CM | POA: Insufficient documentation

## 2017-01-14 DIAGNOSIS — E119 Type 2 diabetes mellitus without complications: Secondary | ICD-10-CM | POA: Insufficient documentation

## 2017-01-14 DIAGNOSIS — G894 Chronic pain syndrome: Secondary | ICD-10-CM

## 2017-01-14 DIAGNOSIS — M5030 Other cervical disc degeneration, unspecified cervical region: Secondary | ICD-10-CM | POA: Insufficient documentation

## 2017-01-14 DIAGNOSIS — M5412 Radiculopathy, cervical region: Secondary | ICD-10-CM | POA: Diagnosis not present

## 2017-01-14 DIAGNOSIS — I10 Essential (primary) hypertension: Secondary | ICD-10-CM | POA: Diagnosis not present

## 2017-01-14 DIAGNOSIS — R0609 Other forms of dyspnea: Secondary | ICD-10-CM | POA: Insufficient documentation

## 2017-01-14 LAB — ECHOCARDIOGRAM COMPLETE

## 2017-01-14 MED ORDER — HYDROCODONE-ACETAMINOPHEN 5-325 MG PO TABS: 1.0000 | ORAL_TABLET | Freq: Three times a day (TID) | ORAL | 0 refills | Status: DC | PRN
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
## 2017-01-14 NOTE — Telephone Encounter (Signed)
On 01/14/2017 the  [place] was reviewed no conflict was seen on the [place] with multiple prescribers. Ms. Bress has a signed narcotic contract with our office. If there were any discrepancies this would have been reported to her physician.

## 2017-01-16 ENCOUNTER — Encounter: Payer: Self-pay | Admitting: Gastroenterology

## 2017-01-16 ENCOUNTER — Telehealth: Payer: Self-pay | Admitting: Nurse Practitioner

## 2017-01-16 NOTE — Telephone Encounter (Signed)
Called patient to review echo results, patient verbalized understanding and was grateful for the good report. She reports at this time she would like to cancel the order for the coronary CT. She states she has some other health issues she would like to resolve and will call back with worsening cardiac symptoms. She thanked me for the call.

## 2017-01-19 ENCOUNTER — Telehealth: Payer: Self-pay | Admitting: Gastroenterology

## 2017-01-19 NOTE — Telephone Encounter (Signed)
She is okay to schedule. Her consent form has expired. She will need a pre-visit. Thanks!

## 2017-01-20 NOTE — Telephone Encounter (Signed)
Pt has been scheduled for procedure on 02/12/2017  @10am 

## 2017-01-22 ENCOUNTER — Ambulatory Visit: Payer: Medicare Other | Admitting: Gastroenterology

## 2017-01-23 ENCOUNTER — Encounter: Payer: Self-pay | Admitting: Nurse Practitioner

## 2017-01-23 ENCOUNTER — Ambulatory Visit (INDEPENDENT_AMBULATORY_CARE_PROVIDER_SITE_OTHER): Payer: Medicare Other | Admitting: Nurse Practitioner

## 2017-01-23 VITALS — BP 130/70 | HR 76 | Ht 68.0 in | Wt 189.2 lb

## 2017-01-23 DIAGNOSIS — K5900 Constipation, unspecified: Secondary | ICD-10-CM | POA: Diagnosis not present

## 2017-01-23 DIAGNOSIS — F411 Generalized anxiety disorder: Secondary | ICD-10-CM

## 2017-01-23 DIAGNOSIS — R1310 Dysphagia, unspecified: Secondary | ICD-10-CM

## 2017-01-23 DIAGNOSIS — R112 Nausea with vomiting, unspecified: Secondary | ICD-10-CM

## 2017-01-23 MED ORDER — ONDANSETRON HCL 4 MG PO TABS: 4.0000 mg | ORAL_TABLET | Freq: Three times a day (TID) | ORAL | 2 refills | Status: DC | PRN

## 2017-01-23 MED ORDER — NA SULFATE-K SULFATE-MG SULF 17.5-3.13-1.6 GM/177ML PO SOLN: ORAL | 0 refills | Status: DC

## 2017-01-23 MED ORDER — LUBIPROSTONE 24 MCG PO CAPS: 24.0000 ug | ORAL_CAPSULE | Freq: Two times a day (BID) | ORAL | 3 refills | Status: DC

## 2017-01-23 NOTE — Patient Instructions (Addendum)
If you are age 65 or older, your body mass index should be between 23-30. Your Body mass index is 28.77 kg/m. If this is out of the aforementioned range listed, please consider follow up with your Primary Care Provider.  If you are age 84 or younger, your body mass index should be between 19-25. Your Body mass index is 28.77 kg/m. If this is out of the aformentioned range listed, please consider follow up with your Primary Care Provider.   You have been given a bowel purge - Plenvu - please follow instructions.  You have been scheduled for an endoscopy and colonoscopy. Please follow the written instructions given to you at your visit today. Please pick up your prep supplies at the pharmacy within the next 1-3 days. If you use inhalers (even only as needed), please bring them with you on the day of your procedure. Your physician has requested that you go to www.startemmi.com and enter the access code given to you at your visit today. This web site gives a general overview about your procedure. However, you should still follow specific instructions given to you by our office regarding your preparation for the procedure.  We have sent the following medications to your pharmacy for you to pick up at your convenience: [place]  Follow up with Tye Savoy, NP after endoscopic procedures. Call for an appointment in December.  Thank you for choosing me and [facility].   Tye Savoy, NP

## 2017-01-23 NOTE — Progress Notes (Signed)
     Chief Complaint:  RUQ pain  HPI: Patient is a 66 year old female known to Dr. Silverio Decamp for hx of GERD and chronic constipation. .Years ago she was treated by Dr. Deatra Ina for gastroparesis  .Gastric emptying study in 201 revealed 54 % retention at 2 hours. Patient was here in August with multiple gastrointestinal complaints including heartburn, nausea, vomiting, constipation, bloating, excessive belching, lower abdominal pain, and intermittent solid food dysphagia. Please see the dictation for further details. Patient was scheduled for an EGD but procedure was canceled as patient needed a preprocedure echocardiogram  Patient returns with multiple GI complaints but her main one is that of constipation. In hospital 2 weeks ago with ear infection, got a soap suds enema for constipation without any results. Taking Linzess and 3 glasses a day of miralax every day at home but still complains of decreased urge and extreme difficulty with evacuation. She takes Narcotics as needed which on average is about 6 times a week. She is having some rectal bleeding associated with straining.   In ED late Aug for RLQ  / right flank pain (about SI joint). CT scan with renal protocol showed moderate stool burden. WBC and hgb were normal. The pain has not improved and she is severely worried about the cause of the pain. It hurts to bend over to clean cat litter and also hurts when moving from sitting to standing position.    Patient is extremly emotional, crying. A lot of stressors in life right now   Past Medical History:  Diagnosis Date  . Anxiety   . Arthritis   . Diabetes mellitus without complication ([place])   . Hyperlipidemia   . Hypertension   . PTSD (post-traumatic stress disorder)     Patient's surgical history, family medical history, social history, medications and allergies were all reviewed in Epic    Physical Exam: BP 130/70   Pulse 76   Ht 5\' 8"  (1.727 m)   Wt 189 lb 3.2 oz (85.8 kg)    BMI 28.77 kg/m   GENERAL:  Well developed white female in NAD PSYCH: :Emotional, labile mood but cooperative.  EENT:  conjunctiva pink, mucous membranes moist, neck supple without masses CARDIAC:  RRR, no murmur heard, no peripheral edema PULM: Normal respiratory effort, lungs CTA bilaterally, no wheezing ABDOMEN:  Nondistended, soft, nontender. No obvious masses,  normal bowel sounds SKIN:  turgor, no lesions seen Musculoskeletal:  Normal muscle tone, normal strength NEURO: Alert and oriented x 3, no focal neurologic deficits    ASSESSMENT and PLAN:   Pleasant 66 year old female with multiple GI complaints but most bothersome at present is that of severe constipation. Always had some constipation but now worse than ever,and that is despite TID miralax and , linzess  Narcotics most likely contributing to progressive constipation but she is still very worried. and anxious.  -purge bowels over weekend with bowel prep, sample given. Call me Monday with update Will need an aggressive bowel regimen following purge  -colon cancer screening not due until Dec 2019 but given progressive constipation / rectal bleeding it is not unreasonable to proceed earlier.  The risks and benefits of the procedure were discussed and the patient agrees to proceed.  -she was scheduled forEGD in Aug  For dysphagia. Currently no significant dysphagia. Her main focus right now is the severe constipation.      Tye Savoy , NP 01/23/2017, 9:27 AM

## 2017-01-28 NOTE — Progress Notes (Signed)
Reviewed and agree with documentation and assessment and plan. K. Veena Nandigam , MD   

## 2017-01-29 ENCOUNTER — Encounter: Payer: Self-pay | Admitting: Registered Nurse

## 2017-02-02 ENCOUNTER — Encounter: Payer: Self-pay | Admitting: Physical Medicine & Rehabilitation

## 2017-02-02 ENCOUNTER — Encounter (HOSPITAL_BASED_OUTPATIENT_CLINIC_OR_DEPARTMENT_OTHER): Payer: Medicare Other | Admitting: Physical Medicine & Rehabilitation

## 2017-02-02 VITALS — BP 136/82 | HR 95

## 2017-02-02 DIAGNOSIS — M25512 Pain in left shoulder: Secondary | ICD-10-CM | POA: Diagnosis not present

## 2017-02-02 DIAGNOSIS — G8929 Other chronic pain: Secondary | ICD-10-CM | POA: Diagnosis not present

## 2017-02-02 DIAGNOSIS — M75102 Unspecified rotator cuff tear or rupture of left shoulder, not specified as traumatic: Secondary | ICD-10-CM

## 2017-02-02 DIAGNOSIS — Z79899 Other long term (current) drug therapy: Secondary | ICD-10-CM | POA: Diagnosis not present

## 2017-02-02 DIAGNOSIS — Z5181 Encounter for therapeutic drug level monitoring: Secondary | ICD-10-CM | POA: Diagnosis not present

## 2017-02-02 DIAGNOSIS — R102 Pelvic and perineal pain: Secondary | ICD-10-CM | POA: Diagnosis not present

## 2017-02-02 DIAGNOSIS — M542 Cervicalgia: Secondary | ICD-10-CM | POA: Diagnosis not present

## 2017-02-02 DIAGNOSIS — M5030 Other cervical disc degeneration, unspecified cervical region: Secondary | ICD-10-CM | POA: Diagnosis not present

## 2017-02-02 DIAGNOSIS — G894 Chronic pain syndrome: Secondary | ICD-10-CM | POA: Diagnosis not present

## 2017-02-02 DIAGNOSIS — M545 Low back pain: Secondary | ICD-10-CM | POA: Diagnosis not present

## 2017-02-02 MED ORDER — HYDROCODONE-ACETAMINOPHEN 5-325 MG PO TABS: 1.0000 | ORAL_TABLET | Freq: Three times a day (TID) | ORAL | 0 refills | Status: DC | PRN

## 2017-02-02 NOTE — Patient Instructions (Signed)
CONTINUE WITH YOUR STRETCHES AND INCREASE YOUR EXERCISE IN GENERAL!!!

## 2017-02-02 NOTE — Progress Notes (Signed)
PROCEDURE NOTE  DIAGNOSIS: Left shoulder pain and rotator cuff syndrome  INTERVENTION:  Major joint injection   Pt with increased pain in the left shoulder with overhead and rotational activities.   After informed consent and preparation of the skin with betadine and isopropyl alcohol, I injected 6mg  (1cc) of celestone and 4cc of 1% lidocaine into the left shoulder via posterior approach. Additionally, aspiration was performed prior to injection. The patient tolerated well, and no complications were encountered. Afterward the area was cleaned and dressed. Post- injection instructions were provided.   I filled her hydrocodone rx for next month (11/14) so that she does not need to return for immediately for another visit. She can come back to see [place] in December.     Meredith Staggers, MD, [facility] 02/02/2017

## 2017-02-08 LAB — DRUG TOX MONITOR 1 W/CONF, ORAL FLD

## 2017-02-08 LAB — DRUG TOX ALC METAB W/CON, ORAL FLD: Alcohol Metabolite: NEGATIVE ng/mL (ref <25)

## 2017-02-12 ENCOUNTER — Encounter: Payer: Medicare Other | Admitting: Gastroenterology

## 2017-02-16 ENCOUNTER — Telehealth: Payer: Self-pay | Admitting: *Deleted

## 2017-02-16 NOTE — Telephone Encounter (Signed)
Second oral swab that has been negative for hydrocodone though she reported last dose was the pm before.  Same for the 11/17/16 test as well. She did have medication to be counted at the appt.  I did not list 11/22/16 test as inconsistent because dose was the pm before, but I would expect to see at lease metabolite if no parent drug if taking consistently.  Because the urine was positive in May 2018, perhaps she should be urine tested next time. Please advise.

## 2017-02-18 ENCOUNTER — Ambulatory Visit: Payer: Medicare Other | Admitting: Registered Nurse

## 2017-02-18 ENCOUNTER — Ambulatory Visit (AMBULATORY_SURGERY_CENTER): Payer: Medicare Other | Admitting: Gastroenterology

## 2017-02-18 ENCOUNTER — Encounter: Payer: Self-pay | Admitting: Gastroenterology

## 2017-02-18 VITALS — BP 134/62 | HR 58 | Temp 98.6°F | Resp 16 | Ht 68.0 in | Wt 189.0 lb

## 2017-02-18 DIAGNOSIS — K5900 Constipation, unspecified: Secondary | ICD-10-CM

## 2017-02-18 DIAGNOSIS — R933 Abnormal findings on diagnostic imaging of other parts of digestive tract: Secondary | ICD-10-CM | POA: Diagnosis not present

## 2017-02-18 DIAGNOSIS — G43A0 Cyclical vomiting, not intractable: Secondary | ICD-10-CM | POA: Diagnosis not present

## 2017-02-18 DIAGNOSIS — K2950 Unspecified chronic gastritis without bleeding: Secondary | ICD-10-CM | POA: Diagnosis not present

## 2017-02-18 DIAGNOSIS — R112 Nausea with vomiting, unspecified: Secondary | ICD-10-CM | POA: Diagnosis not present

## 2017-02-18 DIAGNOSIS — R1115 Cyclical vomiting syndrome unrelated to migraine: Secondary | ICD-10-CM

## 2017-02-18 DIAGNOSIS — R1310 Dysphagia, unspecified: Secondary | ICD-10-CM | POA: Diagnosis not present

## 2017-02-18 MED ORDER — NALOXEGOL OXALATE 25 MG PO TABS: 25.0000 mg | ORAL_TABLET | Freq: Every day | ORAL | 6 refills | Status: DC

## 2017-02-18 MED ORDER — SODIUM CHLORIDE 0.9 % IV SOLN: 500.0000 mL | INTRAVENOUS | Status: DC

## 2017-02-18 NOTE — Op Note (Signed)
[facility] Patient Name: [name] Procedure Date: 02/18/2017 2:47 PM MRN: [id number] Endoscopist: Mauri Pole , MD Age: 66 Referring MD:  Date of Birth: Mar 07, 1951 Gender: Female Account #: [id number] Procedure:                Upper GI endoscopy Indications:              Dysphagia, Persistent vomiting of unknown cause Medicines:                Monitored Anesthesia Care Procedure:                Pre-Anesthesia Assessment:                           - Prior to the procedure, a History and Physical                            was performed, and patient medications and                            allergies were reviewed. The patient's tolerance of                            previous anesthesia was also reviewed. The risks                            and benefits of the procedure and the sedation                            options and risks were discussed with the patient.                            All questions were answered, and informed consent                            was obtained. Prior Anticoagulants: The patient has                            taken no previous anticoagulant or antiplatelet                            agents. ASA Grade Assessment: II - A patient with                            mild systemic disease. After reviewing the risks                            and benefits, the patient was deemed in                            satisfactory condition to undergo the procedure.                           After obtaining informed consent, the endoscope was  passed under direct vision. Throughout the                            procedure, the patient's blood pressure, pulse, and                            oxygen saturations were monitored continuously. The                            Model GIF-HQ190 [id number]) scope was introduced                            through the mouth, and advanced to the second part   of duodenum. The upper GI endoscopy was                            accomplished without difficulty. The patient                            tolerated the procedure well. Scope In: Scope Out: Findings:                 The esophagus was normal.                           Patchy mild inflammation characterized by                            congestion (edema), erythema and linear erosions                            was found in the gastric antrum. Biopsies were                            taken with a cold forceps for Helicobacter pylori                            testing.                           The examined duodenum was normal. Complications:            No immediate complications. Estimated Blood Loss:     Estimated blood loss was minimal. Impression:               - Normal esophagus.                           - Gastritis. Biopsied.                           - Normal examined duodenum. Recommendation:           - Patient has a contact number available for                            emergencies. The signs and symptoms of potential  delayed complications were discussed with the                            patient. Return to normal activities tomorrow.                            Written discharge instructions were provided to the                            patient.                           - Resume previous diet.                           - Continue present medications.                           - Await pathology results. Mauri Pole, MD 02/18/2017 3:24:03 PM This report has been signed electronically.

## 2017-02-18 NOTE — Patient Instructions (Signed)
YOU HAD AN ENDOSCOPIC PROCEDURE TODAY AT [facility]:   Refer to the procedure report that was given to you for any specific questions about what was found during the examination.  If the procedure report does not answer your questions, please call your gastroenterologist to clarify.  If you requested that your care partner not be given the details of your procedure findings, then the procedure report has been included in a sealed envelope for you to review at your convenience later.  YOU SHOULD EXPECT: Some feelings of bloating in the abdomen. Passage of more gas than usual.  Walking can help get rid of the air that was put into your GI tract during the procedure and reduce the bloating. If you had a lower endoscopy (such as a colonoscopy or flexible sigmoidoscopy) you may notice spotting of blood in your stool or on the toilet paper. If you underwent a bowel prep for your procedure, you may not have a normal bowel movement for a few days.  Please Note:  You might notice some irritation and congestion in your nose or some drainage.  This is from the oxygen used during your procedure.  There is no need for concern and it should clear up in a day or so.  SYMPTOMS TO REPORT IMMEDIATELY:   Following lower endoscopy (colonoscopy or flexible sigmoidoscopy):  Excessive amounts of blood in the stool  Significant tenderness or worsening of abdominal pains  Swelling of the abdomen that is new, acute  Fever of 100F or higher   Following upper endoscopy (EGD)  Vomiting of blood or coffee ground material  New chest pain or pain under the shoulder blades  Painful or persistently difficult swallowing  New shortness of breath  Fever of 100F or higher  Black, tarry-looking stools  For urgent or emergent issues, a gastroenterologist can be reached at any hour by calling [phone].   DIET:  We do recommend a small meal at first, but then you may proceed to your regular diet.  Drink  plenty of fluids but you should avoid alcoholic beverages for 24 hours.  ACTIVITY:  You should plan to take it easy for the rest of today and you should NOT DRIVE or use heavy machinery until tomorrow (because of the sedation medicines used during the test).    FOLLOW UP: Our staff will call the number listed on your records the next business day following your procedure to check on you and address any questions or concerns that you may have regarding the information given to you following your procedure. If we do not reach you, we will leave a message.  However, if you are feeling well and you are not experiencing any problems, there is no need to return our call.  We will assume that you have returned to your regular daily activities without incident.  If any biopsies were taken you will be contacted by phone or by letter within the next 1-3 weeks.  Please call us at [phone] if you have not heard about the biopsies in 3 weeks.   SIGNATURES/CONFIDENTIALITY: You and/or your care partner have signed paperwork which will be entered into your electronic medical record.  These signatures attest to the fact that that the information above on your After Visit Summary has been reviewed and is understood.  Full responsibility of the confidentiality of this discharge information lies with you and/or your care-partner.  Next colonoscopy-5 years  Please read over handouts about gastritis and hemorrhoids  Continue your normal medications  Movantik was sent to your pharmacy- take 1 tablet daily

## 2017-02-18 NOTE — Progress Notes (Signed)
Called to room to assist during endoscopic procedure.  Patient ID and intended procedure confirmed with present staff. Received instructions for my participation in the procedure from the performing physician.  

## 2017-02-18 NOTE — Progress Notes (Signed)
Pt's states no medical or surgical changes since previsit or office visit. 

## 2017-02-18 NOTE — Op Note (Signed)
[facility] Patient Name: [name] Procedure Date: 02/18/2017 2:46 PM MRN: [id number] Endoscopist: Mauri Pole , MD Age: 66 Referring MD:  Date of Birth: [birth year] Gender: Female Account #: [id number] Procedure:                Colonoscopy Indications:              Abdominal pain in the right lower quadrant,                            Abnormal CT of the GI tract, Constipation Medicines:                Monitored Anesthesia Care Procedure:                Pre-Anesthesia Assessment:                           - Prior to the procedure, a History and Physical                            was performed, and patient medications and                            allergies were reviewed. The patient's tolerance of                            previous anesthesia was also reviewed. The risks                            and benefits of the procedure and the sedation                            options and risks were discussed with the patient.                            All questions were answered, and informed consent                            was obtained. Prior Anticoagulants: The patient has                            taken no previous anticoagulant or antiplatelet                            agents. ASA Grade Assessment: II - A patient with                            mild systemic disease. After reviewing the risks                            and benefits, the patient was deemed in                            satisfactory condition to undergo the procedure.  After obtaining informed consent, the colonoscope                            was passed under direct vision. Throughout the                            procedure, the patient's blood pressure, pulse, and                            oxygen saturations were monitored continuously. The                            Colonoscope was introduced through the anus and                            advanced to the the  cecum, identified by                            appendiceal orifice and ileocecal valve. The                            colonoscopy was performed without difficulty. The                            patient tolerated the procedure well. The quality                            of the bowel preparation was fair. The ileocecal                            valve, appendiceal orifice, and rectum were                            photographed. Scope In: 2:57:17 PM Scope Out: 3:18:26 PM Scope Withdrawal Time: 0 hours 13 minutes 35 seconds  Total Procedure Duration: 0 hours 21 minutes 9 seconds  Findings:                 The perianal and digital rectal examinations were                            normal.                           Non-bleeding internal hemorrhoids were found during                            retroflexion. The hemorrhoids were small.                           The exam was otherwise without abnormality. Complications:            No immediate complications. Estimated Blood Loss:     Estimated blood loss: none. Impression:               - Preparation of the colon was fair.                           -  Non-bleeding internal hemorrhoids.                           - The examination was otherwise normal.                           - No specimens collected. Recommendation:           - Patient has a contact number available for                            emergencies. The signs and symptoms of potential                            delayed complications were discussed with the                            patient. Return to normal activities tomorrow.                            Written discharge instructions were provided to the                            patient.                           - Resume previous diet.                           - Continue present medications.                           - Repeat colonoscopy in 5 years for screening                            purposes. Mauri Pole,  MD 02/18/2017 3:26:46 PM This report has been signed electronically.

## 2017-02-18 NOTE — Progress Notes (Signed)
Patient states she was hospitalized the first of October for Staph infection.

## 2017-02-18 NOTE — Progress Notes (Signed)
To PACU, VSS. Report to RN.tb 

## 2017-02-19 ENCOUNTER — Telehealth: Payer: Self-pay | Admitting: *Deleted

## 2017-02-19 ENCOUNTER — Encounter: Payer: Self-pay | Admitting: Gastroenterology

## 2017-02-19 ENCOUNTER — Telehealth: Payer: Self-pay

## 2017-02-19 NOTE — Telephone Encounter (Signed)
No answer for second post procedure call back and no voicemail available. SM

## 2017-02-19 NOTE — Telephone Encounter (Signed)
  Follow up Call-  Call back number 02/18/2017  Post procedure Call Back phone  # [phone]  Permission to leave phone message No  Some recent data might be hidden     Attempted to leave a message but the mailbox was full.

## 2017-02-25 ENCOUNTER — Encounter: Payer: Self-pay | Admitting: Gastroenterology

## 2017-03-17 ENCOUNTER — Other Ambulatory Visit: Payer: Self-pay | Admitting: Family Medicine

## 2017-03-17 DIAGNOSIS — F329 Major depressive disorder, single episode, unspecified: Secondary | ICD-10-CM

## 2017-03-17 DIAGNOSIS — F419 Anxiety disorder, unspecified: Secondary | ICD-10-CM

## 2017-03-17 DIAGNOSIS — F32A Depression, unspecified: Secondary | ICD-10-CM

## 2017-03-18 ENCOUNTER — Encounter: Payer: Self-pay | Admitting: Family Medicine

## 2017-03-19 ENCOUNTER — Other Ambulatory Visit: Payer: Self-pay | Admitting: Family Medicine

## 2017-03-19 DIAGNOSIS — F32A Depression, unspecified: Secondary | ICD-10-CM

## 2017-03-19 DIAGNOSIS — F419 Anxiety disorder, unspecified: Secondary | ICD-10-CM

## 2017-03-19 DIAGNOSIS — F329 Major depressive disorder, single episode, unspecified: Secondary | ICD-10-CM

## 2017-03-20 ENCOUNTER — Encounter: Payer: Medicare Other | Attending: Physical Medicine & Rehabilitation | Admitting: Registered Nurse

## 2017-03-20 ENCOUNTER — Other Ambulatory Visit: Payer: Self-pay

## 2017-03-20 ENCOUNTER — Other Ambulatory Visit: Payer: Self-pay | Admitting: *Deleted

## 2017-03-20 ENCOUNTER — Ambulatory Visit: Payer: Medicare Other | Admitting: Cardiovascular Disease

## 2017-03-20 ENCOUNTER — Encounter: Payer: Self-pay | Admitting: Registered Nurse

## 2017-03-20 VITALS — BP 117/80 | HR 93

## 2017-03-20 DIAGNOSIS — E785 Hyperlipidemia, unspecified: Secondary | ICD-10-CM | POA: Insufficient documentation

## 2017-03-20 DIAGNOSIS — R102 Pelvic and perineal pain: Secondary | ICD-10-CM | POA: Diagnosis not present

## 2017-03-20 DIAGNOSIS — G8929 Other chronic pain: Secondary | ICD-10-CM

## 2017-03-20 DIAGNOSIS — M25512 Pain in left shoulder: Secondary | ICD-10-CM | POA: Diagnosis not present

## 2017-03-20 DIAGNOSIS — Z5181 Encounter for therapeutic drug level monitoring: Secondary | ICD-10-CM | POA: Diagnosis not present

## 2017-03-20 DIAGNOSIS — M542 Cervicalgia: Secondary | ICD-10-CM

## 2017-03-20 DIAGNOSIS — M75102 Unspecified rotator cuff tear or rupture of left shoulder, not specified as traumatic: Secondary | ICD-10-CM | POA: Diagnosis not present

## 2017-03-20 DIAGNOSIS — E119 Type 2 diabetes mellitus without complications: Secondary | ICD-10-CM | POA: Insufficient documentation

## 2017-03-20 DIAGNOSIS — G894 Chronic pain syndrome: Secondary | ICD-10-CM | POA: Diagnosis not present

## 2017-03-20 DIAGNOSIS — Z79899 Other long term (current) drug therapy: Secondary | ICD-10-CM

## 2017-03-20 DIAGNOSIS — M47812 Spondylosis without myelopathy or radiculopathy, cervical region: Secondary | ICD-10-CM | POA: Diagnosis not present

## 2017-03-20 DIAGNOSIS — F329 Major depressive disorder, single episode, unspecified: Secondary | ICD-10-CM | POA: Diagnosis not present

## 2017-03-20 DIAGNOSIS — F4310 Post-traumatic stress disorder, unspecified: Secondary | ICD-10-CM | POA: Diagnosis not present

## 2017-03-20 DIAGNOSIS — M545 Low back pain: Secondary | ICD-10-CM | POA: Diagnosis not present

## 2017-03-20 DIAGNOSIS — M5030 Other cervical disc degeneration, unspecified cervical region: Secondary | ICD-10-CM | POA: Diagnosis not present

## 2017-03-20 DIAGNOSIS — T1490XA Injury, unspecified, initial encounter: Secondary | ICD-10-CM | POA: Diagnosis not present

## 2017-03-20 DIAGNOSIS — M5412 Radiculopathy, cervical region: Secondary | ICD-10-CM | POA: Diagnosis not present

## 2017-03-20 DIAGNOSIS — G2581 Restless legs syndrome: Secondary | ICD-10-CM | POA: Diagnosis not present

## 2017-03-20 DIAGNOSIS — I10 Essential (primary) hypertension: Secondary | ICD-10-CM | POA: Diagnosis not present

## 2017-03-20 MED ORDER — HYDROCODONE-ACETAMINOPHEN 5-325 MG PO TABS: 1.0000 | ORAL_TABLET | Freq: Three times a day (TID) | ORAL | 0 refills | Status: DC | PRN

## 2017-03-20 NOTE — Patient Instructions (Addendum)
Discuss Taking Gabpentin 400 mg in the Morning and late afternoon and two capsules at Bedtime.  Call office in a week to evaluate.
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
## 2017-03-20 NOTE — Telephone Encounter (Signed)
Rx was already sent to her pharmacy on 03/18/17. Thanks, BJ

## 2017-04-01 ENCOUNTER — Encounter: Payer: Self-pay | Admitting: Registered Nurse

## 2017-04-10 ENCOUNTER — Telehealth: Payer: Self-pay

## 2017-04-10 ENCOUNTER — Encounter: Payer: Self-pay | Admitting: Nurse Practitioner

## 2017-04-10 NOTE — Telephone Encounter (Signed)
Patient has also sent 2 emails which I have responded to.

## 2017-04-14 DIAGNOSIS — F331 Major depressive disorder, recurrent, moderate: Secondary | ICD-10-CM | POA: Diagnosis not present

## 2017-04-22 ENCOUNTER — Ambulatory Visit: Payer: Medicare Other | Admitting: Nurse Practitioner

## 2017-05-12 ENCOUNTER — Encounter: Payer: Medicare HMO | Attending: Physical Medicine & Rehabilitation | Admitting: Registered Nurse

## 2017-05-12 ENCOUNTER — Encounter: Payer: Self-pay | Admitting: Registered Nurse

## 2017-05-12 VITALS — BP 153/88 | HR 69

## 2017-05-12 DIAGNOSIS — F4310 Post-traumatic stress disorder, unspecified: Secondary | ICD-10-CM | POA: Diagnosis not present

## 2017-05-12 DIAGNOSIS — I10 Essential (primary) hypertension: Secondary | ICD-10-CM | POA: Diagnosis not present

## 2017-05-12 DIAGNOSIS — G894 Chronic pain syndrome: Secondary | ICD-10-CM

## 2017-05-12 DIAGNOSIS — Z79899 Other long term (current) drug therapy: Secondary | ICD-10-CM | POA: Diagnosis not present

## 2017-05-12 DIAGNOSIS — M25512 Pain in left shoulder: Secondary | ICD-10-CM | POA: Diagnosis not present

## 2017-05-12 DIAGNOSIS — G8929 Other chronic pain: Secondary | ICD-10-CM | POA: Insufficient documentation

## 2017-05-12 DIAGNOSIS — Z5181 Encounter for therapeutic drug level monitoring: Secondary | ICD-10-CM | POA: Diagnosis not present

## 2017-05-12 DIAGNOSIS — T1490XA Injury, unspecified, initial encounter: Secondary | ICD-10-CM | POA: Insufficient documentation

## 2017-05-12 DIAGNOSIS — E785 Hyperlipidemia, unspecified: Secondary | ICD-10-CM | POA: Diagnosis not present

## 2017-05-12 DIAGNOSIS — M5412 Radiculopathy, cervical region: Secondary | ICD-10-CM | POA: Diagnosis not present

## 2017-05-12 DIAGNOSIS — M545 Low back pain: Secondary | ICD-10-CM | POA: Insufficient documentation

## 2017-05-12 DIAGNOSIS — M542 Cervicalgia: Secondary | ICD-10-CM | POA: Diagnosis not present

## 2017-05-12 DIAGNOSIS — R102 Pelvic and perineal pain: Secondary | ICD-10-CM | POA: Insufficient documentation

## 2017-05-12 DIAGNOSIS — E119 Type 2 diabetes mellitus without complications: Secondary | ICD-10-CM | POA: Insufficient documentation

## 2017-05-12 DIAGNOSIS — G2581 Restless legs syndrome: Secondary | ICD-10-CM | POA: Insufficient documentation

## 2017-05-12 DIAGNOSIS — M5030 Other cervical disc degeneration, unspecified cervical region: Secondary | ICD-10-CM | POA: Insufficient documentation

## 2017-05-12 DIAGNOSIS — M75102 Unspecified rotator cuff tear or rupture of left shoulder, not specified as traumatic: Secondary | ICD-10-CM

## 2017-05-12 DIAGNOSIS — F329 Major depressive disorder, single episode, unspecified: Secondary | ICD-10-CM | POA: Diagnosis not present

## 2017-05-12 MED ORDER — HYDROCODONE-ACETAMINOPHEN 5-325 MG PO TABS: 1.0000 | ORAL_TABLET | Freq: Three times a day (TID) | ORAL | 0 refills | Status: DC | PRN

## 2017-05-12 NOTE — Patient Instructions (Addendum)
Start Gabapentin 400 mg in the morning and late afternoon and continue with two capsules at bedtime.  Call office in a week [phone]
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
## 2017-05-18 ENCOUNTER — Ambulatory Visit (INDEPENDENT_AMBULATORY_CARE_PROVIDER_SITE_OTHER): Payer: Medicare HMO | Admitting: Family Medicine

## 2017-05-18 ENCOUNTER — Encounter: Payer: Self-pay | Admitting: Family Medicine

## 2017-05-18 VITALS — BP 126/76 | HR 76 | Temp 97.6°F | Resp 12 | Ht 68.0 in | Wt 187.5 lb

## 2017-05-18 DIAGNOSIS — F341 Dysthymic disorder: Secondary | ICD-10-CM | POA: Diagnosis not present

## 2017-05-18 DIAGNOSIS — I10 Essential (primary) hypertension: Secondary | ICD-10-CM

## 2017-05-18 DIAGNOSIS — E118 Type 2 diabetes mellitus with unspecified complications: Secondary | ICD-10-CM | POA: Diagnosis not present

## 2017-05-18 LAB — BASIC METABOLIC PANEL
BUN: 14 mg/dL (ref 6–23)
CO2: 30 mEq/L (ref 19–32)
Calcium: 10 mg/dL (ref 8.4–10.5)
Chloride: 99 mEq/L (ref 96–112)
Creatinine, Ser: 0.94 mg/dL (ref 0.40–1.20)
GFR: 63.45 mL/min (ref >60.00)
Glucose, Bld: 219 mg/dL — ABNORMAL HIGH (ref 70–99)
Potassium: 4.1 mEq/L (ref 3.5–5.1)
Sodium: 141 mEq/L (ref 135–145)

## 2017-05-18 LAB — MICROALBUMIN / CREATININE URINE RATIO
Creatinine,U: 71.4 mg/dL
Microalb Creat Ratio: 1 mg/g (ref 0.0–30.0)
Microalb, Ur: <0.7 mg/dL (ref 0.0–1.9)

## 2017-05-18 LAB — HEMOGLOBIN A1C: Hgb A1c MFr Bld: 7.7 % — ABNORMAL HIGH (ref 4.6–6.5)

## 2017-05-18 NOTE — Assessment & Plan Note (Signed)
HgA1C pending today. No changes in current management,form completed. Regular exercise and healthy diet with avoidance of added sugar food intake is an important part of treatment and recommended. Annual eye exam, periodic dental and foot care recommended. F/U in 5 months

## 2017-05-18 NOTE — Progress Notes (Signed)
HPI:   [name] is a 67 y.o. female, who is here today for 4-5 months follow up.   She was last seen on 01/13/17 for acute illness.  Since her last OV she has followed with pain clinic and GI (colonoscopy).   Diabetes Mellitus II:   Dx in 01-Aug-2012. Currently on Farxiga 10 mg daily. She took other meds in the past that she did not tolerate well. She needs form to be completed, so she can get med for free.  Metformin caused GI side effects.  Checking BS's : 120's,sometimes in the 70's,post prandial 170's.  Hypoglycemia: Denies.  She is tolerating medications well. She denies abdominal pain, nausea, vomiting, polydipsia, polyuria, or polyphagia. No numbness, tingling, or burning.  Last eye exam over a year,has appt in 01-Aug-2017.    Lab Results  Component Value Date   CREATININE 0.86 12/29/2016   BUN 15 12/29/2016   NA 141 12/29/2016   K 4.2 12/29/2016   CL 108 12/29/2016   CO2 26 12/29/2016    Lab Results  Component Value Date   HGBA1C 7.1 (H) 12/28/2016     Hypertension:  Currently she is on HCTZ 25 mg daily. Home BP readings: She does not.  Denies severe/frequent headache, visual changes, chest pain, dyspnea, palpitation, focal weakness, or edema.  Anxiety and depression:  She is currently on Klonopin 1 mg twice daily as needed. She discontinued Cymbalta 60 mg daily,it made her muscle "hurt." She has not noted changes in depression or anxiety.  Anxiety worsened in August 02, 2014 after her son's death. 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   History of chronic pain, she follows with pain clinic.  She is on Gabapentin at bedtime (RLS).   Her fiance recently was diagnosed initially with an abscess in left side of colon, laparoscopy to drain it and found to be a underlying mass,malignency and started on chemo. 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  In general she is dealing with all these well. Denies suicidal thoughts.    Review of Systems    Constitutional: Positive for fatigue. Negative for activity change, appetite change and fever.  HENT: Negative for mouth sores, nosebleeds and trouble swallowing.   Eyes: Negative for redness and visual disturbance.  Respiratory: Negative for cough, shortness of breath and wheezing.   Cardiovascular: Negative for chest pain, palpitations and leg swelling.  Gastrointestinal: Negative for abdominal pain, nausea and vomiting.       Negative for changes in bowel habits.  Endocrine: Negative for cold intolerance, heat intolerance, polydipsia, polyphagia and polyuria.  Genitourinary: Negative for decreased urine volume, dysuria and hematuria.  Musculoskeletal: Positive for arthralgias. Negative for gait problem and myalgias.  Skin: Negative for rash and wound.  Neurological: Negative for syncope, weakness and headaches.  Hematological: Negative for adenopathy.  Psychiatric/Behavioral: Positive for sleep disturbance. Negative for confusion and suicidal ideas. The patient is nervous/anxious.      Current Outpatient Medications on File Prior to Visit  Medication Sig Dispense Refill  . Biotin 10000 MCG TABS Take 10,000 mcg by mouth 2 (two) times a week.    . clonazePAM (KLONOPIN) 1 MG tablet TAKE ONE TABLET BY MOUTH TWO TIMES A DAY AS NEEDED FOR ANXIETY 60 tablet 2  . dapagliflozin propanediol (FARXIGA) 10 MG TABS tablet Take 10 mg by mouth daily. 90 tablet 2  . diclofenac sodium (VOLTAREN) 1 % GEL Apply 2-4 g topically 4 (four) times daily.    Marland Kitchen gabapentin (NEURONTIN) 400 MG capsule  Take 2 capsules (800 mg total) by mouth at bedtime. 60 capsule 4  . hydrochlorothiazide (HYDRODIURIL) 25 MG tablet Take 1 tablet (25 mg total) by mouth daily. 90 tablet 1  . HYDROcodone-acetaminophen (NORCO/VICODIN) 5-325 MG tablet Take 1 tablet by mouth every 8 (eight) hours as needed for moderate pain. 80 tablet 0  . hydrOXYzine (ATARAX/VISTARIL) 10 MG tablet     . multivitamin-lutein (OCUVITE-LUTEIN) CAPS capsule  Take 1 capsule by mouth daily.    . naloxegol oxalate (MOVANTIK) 25 MG TABS tablet Take 1 tablet (25 mg total) daily by mouth. 30 tablet 6  . omeprazole (PRILOSEC) 40 MG capsule Take 1 capsule (40 mg total) by mouth daily. 90 capsule 3  . ondansetron (ZOFRAN) 4 MG tablet Take 1 tablet (4 mg total) by mouth every 8 (eight) hours as needed for nausea or vomiting. 30 tablet 2  . potassium chloride SA (K-DUR,KLOR-CON) 20 MEQ tablet Take 1 tablet (20 mEq total) by mouth daily. 90 tablet 3  . rosuvastatin (CRESTOR) 10 MG tablet Take 10 mg by mouth daily.     . valACYclovir (VALTREX) 500 MG tablet As directed     No current facility-administered medications on file prior to visit.      Past Medical History:  Diagnosis Date  . Anxiety   . Arthritis   . Diabetes mellitus without complication ([place])   . Hyperlipidemia   . Hypertension   . PTSD (post-traumatic stress disorder)    Allergies  Allergen Reactions  . Dilaudid [Hydromorphone] Nausea And Vomiting and Other (See Comments)    Made the patient feel "spaced out," also  . Morphine And Related Itching  . Penicillins Rash    Has patient had a PCN reaction causing immediate rash, facial/tongue/throat swelling, SOB or lightheadedness with hypotension: Yes Has patient had a PCN reaction causing severe rash involving mucus membranes or skin necrosis: No Has patient had a PCN reaction that required hospitalization: No Has patient had a PCN reaction occurring within the last 10 years: No If all of the above answers are "NO", then may proceed with Cephalosporin use.     Social History   Socioeconomic History  . Marital status: Divorced    Spouse name: None  . Number of children: None  . Years of education: None  . Highest education level: None  Social Needs  . Financial resource strain: None  . Food insecurity - worry: None  . Food insecurity - inability: None  . Transportation needs - medical: None  . Transportation needs -  non-medical: None  Occupational History  . None  Tobacco Use  . Smoking status: Never Smoker  . Smokeless tobacco: Never Used  Substance and Sexual Activity  . Alcohol use: No  . Drug use: No  . Sexual activity: None  Other Topics Concern  . None  Social History Narrative  . None    Vitals:   05/18/17 1350  BP: 126/76  Pulse: 76  Resp: 12  Temp: 97.6 F (36.4 C)  SpO2: 96%   Body mass index is 28.51 kg/m.   Physical Exam  Nursing note and vitals reviewed. Constitutional: She is oriented to person, place, and time. She appears well-developed. No distress.  HENT:  Head: Normocephalic and atraumatic.  Mouth/Throat: Oropharynx is clear and moist and mucous membranes are normal.  Eyes: Conjunctivae are normal. Pupils are equal, round, and reactive to light.  Cardiovascular: Normal rate and regular rhythm.  No murmur heard. Pulses:      Dorsalis  pedis pulses are 2+ on the right side, and 2+ on the left side.  Respiratory: Effort normal and breath sounds normal. No respiratory distress.  GI: Soft. She exhibits no mass. There is no hepatomegaly. There is no tenderness.  Musculoskeletal: She exhibits no edema.  Lymphadenopathy:    She has no cervical adenopathy.  Neurological: She is alert and oriented to person, place, and time. She has normal strength. Coordination normal.  Skin: Skin is warm. No erythema.  Psychiatric: She has a normal mood and affect.  Well groomed, good eye contact.    Foot exam 08/2016.    ASSESSMENT AND PLAN:   [name] was seen today for 4 months follow-up.  Orders Placed This Encounter  Procedures  . Basic metabolic panel  . Hemoglobin A1c  . Microalbumin / creatinine urine ratio   Lab Results  Component Value Date   MICROALBUR <0.7 05/18/2017   Lab Results  Component Value Date   HGBA1C 7.7 (H) 05/18/2017   Lab Results  Component Value Date   CREATININE 0.94 05/18/2017   BUN 14 05/18/2017   NA 141 05/18/2017     K 4.1 05/18/2017   CL 99 05/18/2017   CO2 30 05/18/2017     DM (diabetes mellitus), type 2 ([place]) HgA1C pending today. No changes in current management,form completed. Regular exercise and healthy diet with avoidance of added sugar food intake is an important part of treatment and recommended. Annual eye exam, periodic dental and foot care recommended. F/U in 5 months   Essential hypertension Adequately controlled. No changes in current management. DASH and low salt diet recommended. Eye exam recommended annually. F/U in 5 months, before if needed.   ANXIETY DEPRESSION Stable. No changes in current management,some side effects discussed. F/U in 5 months.  She is not interested in adding anxiolytic medication for now. Side effects of Clonazepam discussed. Instructed about warning signs.     -[name] was advised to return sooner than planned today if new concerns arise.       Betty G. Martinique, MD  [facility]. [place] office.

## 2017-05-18 NOTE — Patient Instructions (Addendum)
A few things to remember from today's visit:   Type 2 diabetes mellitus with complication, without long-term current use of insulin ([place]) - Plan: Basic metabolic panel, Hemoglobin A1c, Microalbumin / creatinine urine ratio  Essential hypertension - Plan: Basic metabolic panel  ANXIETY DEPRESSION  No changes today. I hope every thing goes well.  I will see you in 4-5 months,before if needed.   Please be sure medication list is accurate. If a new problem present, please set up appointment sooner than planned today.

## 2017-05-18 NOTE — Assessment & Plan Note (Signed)
Stable. No changes in current management,some side effects discussed. F/U in 5 months.

## 2017-05-18 NOTE — Assessment & Plan Note (Signed)
Adequately controlled. No changes in current management. DASH and low salt diet recommended. Eye exam recommended annually. F/U in 5 months, before if needed.

## 2017-05-24 ENCOUNTER — Encounter: Payer: Self-pay | Admitting: Family Medicine

## 2017-05-25 ENCOUNTER — Encounter: Payer: Self-pay | Admitting: Family Medicine

## 2017-05-28 ENCOUNTER — Encounter: Payer: Self-pay | Admitting: *Deleted

## 2017-06-02 ENCOUNTER — Encounter (INDEPENDENT_AMBULATORY_CARE_PROVIDER_SITE_OTHER): Payer: Self-pay | Admitting: Orthopedic Surgery

## 2017-06-02 ENCOUNTER — Ambulatory Visit (INDEPENDENT_AMBULATORY_CARE_PROVIDER_SITE_OTHER): Payer: Medicare HMO | Admitting: Orthopedic Surgery

## 2017-06-02 ENCOUNTER — Ambulatory Visit (INDEPENDENT_AMBULATORY_CARE_PROVIDER_SITE_OTHER): Payer: Medicare HMO

## 2017-06-02 VITALS — BP 132/89 | HR 97

## 2017-06-02 DIAGNOSIS — M5441 Lumbago with sciatica, right side: Principal | ICD-10-CM

## 2017-06-02 DIAGNOSIS — G8929 Other chronic pain: Secondary | ICD-10-CM | POA: Diagnosis not present

## 2017-06-02 DIAGNOSIS — M4807 Spinal stenosis, lumbosacral region: Secondary | ICD-10-CM

## 2017-06-02 MED ORDER — METHOCARBAMOL 500 MG PO TABS: 500.0000 mg | ORAL_TABLET | Freq: Three times a day (TID) | ORAL | 0 refills | Status: DC | PRN

## 2017-06-02 NOTE — Progress Notes (Signed)
Office Visit Note   Patient: [name]           Date of Birth: 20-Aug-1950           MRN: [id number] Visit Date: 06/02/2017 Requested by: Martinique, Betty G, MD [address] [place], [place] [address] PCP: Martinique, Betty G, MD  Subjective: Chief Complaint  Patient presents with  . Lower Back - Pain    HPI: [name] is a 67 year old patient with 51-month history of worsening low back pain.  Radiating into the right leg behind her knee but not below the knee.  She states it is "burning like a red hot poker".  She does have a history of surgery in 1996 on her back.  She is in pain management for a shoulder issue on the left-hand side.  It is hard for her to stand and do any types of activities of daily living without pain.  She is having pain at night as well.  Patient is diabetic with hemoglobin A1c 7.7 so I am reluctant to consider steroid Dosepak.              ROS: All systems reviewed are negative as they relate to the chief complaint within the history of present illness.  Patient denies  fevers or chills.   Assessment & Plan: Visit Diagnoses:  1. Chronic right-sided low back pain with right-sided sciatica     Plan: Impression is low back pain with right-sided radiation.  This is a slightly higher level than her L5-S1 previous surgery.  There is no spondylolisthesis but there is degenerative disc disease at that L5-S1 level.  This may represent adjacent segment disease.  Need MRI L-spine with possible ESI to follow.  I will add muscle relaxer to her regimen.  I will see her back after that is study.  Inflammatory  Follow-Up Instructions: Return for after MRI.   Orders:  Orders Placed This Encounter  Procedures  . XR Lumbar Spine 2-3 Views   No orders of the defined types were placed in this encounter.     Procedures: No procedures performed   Clinical Data: No additional findings.  Objective: Vital Signs: BP 132/89   Pulse 97   Physical Exam:    Constitutional: Patient appears well-developed HEENT:  Head: Normocephalic Eyes:EOM are normal Neck: Normal range of motion Cardiovascular: Normal rate Pulmonary/chest: Effort normal Neurologic: Patient is alert Skin: Skin is warm Psychiatric: Patient has normal mood and affect    Ortho Exam: Orthopedic examination demonstrates nerve root tension signs positive on the right negative on the left.  No groin pain with internal/external rotation of either leg.  Pedal pulses palpable.  Negative Babinski negative clonus.  Has good ankle dorsi flexion plantar flexion quad and hamstring strength along with hip abduction and adduction strength.  She has no knee effusion bilaterally.  Specialty Comments:  MRI Cervical Spine: Multilevel degenerative changes most notably at C4-5.Left shoulder x-ray: Mild degenerative arthropathy of the glenohumeral & AC Joint.  Imaging: Xr Lumbar Spine 2-3 Views  Result Date: 06/02/2017 AP lateral lumbar spine reviewed.  Visualized hips normal.  Some surgical clips noted in the ischial tuberosity region on the right-hand side.  There is spur formation diffusely throughout the lumbar spine.  There is disc space narrowing at L5-S1.  Remainder of the disc spaces are relatively well-maintained but anterior and lateral spurring is present.  Facet arthritis is worse at L4-5.  No spondylolisthesis or compression fractures visualized    PMFS History:  Patient Active Problem List   Diagnosis Date Noted  . Otitis media 12/28/2016  . External otitis of left ear 12/27/2016  . Dehydration 12/27/2016  . External otitis 12/27/2016  . DOE (dyspnea on exertion) 12/16/2016  . Family history of early CAD 12/16/2016  . Other chest pain 12/16/2016  . Arthritis of left acromioclavicular joint 08/13/2016  . Left shoulder pain 05/12/2016  . Cervical spondylosis without myelopathy 05/12/2016  . Rotator cuff syndrome, left 05/12/2016  . Restless leg syndrome 05/12/2016  .  Primary osteoarthritis, left hand 05/12/2016  . MELENA 10/18/2008  . GASTROPARESIS 05/30/2008  . DM (diabetes mellitus), type 2 ([place]) 05/04/2008  . HYPERCHOLESTEROLEMIA 05/04/2008  . HYPOKALEMIA 05/04/2008  . MORBID OBESITY 05/04/2008  . ANXIETY DEPRESSION 05/04/2008  . GERD 05/04/2008  . Constipation 05/04/2008  . HEMATEMESIS 05/04/2008  . Essential hypertension 05/04/2008   Past Medical History:  Diagnosis Date  . Anxiety   . Arthritis   . Diabetes mellitus without complication ([place])   . Hyperlipidemia   . Hypertension   . PTSD (post-traumatic stress disorder)     Family History  Problem Relation Age of Onset  . Diabetes Mother   . Alzheimer's disease Mother   . Lung cancer Father   . Esophageal cancer Brother   . Colon cancer Neg Hx     Past Surgical History:  Procedure Laterality Date  . ACHILLES TENDON SURGERY Left   . BACK SURGERY    . BLADDER SUSPENSION    . CHOLECYSTECTOMY    . NOSE SURGERY     x 2   . OTHER SURGICAL HISTORY     Breast Duct removed  . PARTIAL HYSTERECTOMY    . RECTOCELE REPAIR    . VEIN SURGERY Right    leg   Social History   Occupational History  . Not on file  Tobacco Use  . Smoking status: Never Smoker  . Smokeless tobacco: Never Used  Substance and Sexual Activity  . Alcohol use: No  . Drug use: No  . Sexual activity: Not on file

## 2017-06-07 ENCOUNTER — Ambulatory Visit
Admission: RE | Admit: 2017-06-07 | Discharge: 2017-06-07 | Disposition: A | Discharge status: Home / Self Care | Payer: Medicare HMO | Source: Ambulatory Visit | Attending: Orthopedic Surgery | Admitting: Orthopedic Surgery

## 2017-06-07 DIAGNOSIS — M4807 Spinal stenosis, lumbosacral region: Secondary | ICD-10-CM

## 2017-06-07 DIAGNOSIS — M48061 Spinal stenosis, lumbar region without neurogenic claudication: Secondary | ICD-10-CM | POA: Diagnosis not present

## 2017-06-11 ENCOUNTER — Encounter (INDEPENDENT_AMBULATORY_CARE_PROVIDER_SITE_OTHER): Payer: Self-pay | Admitting: Orthopedic Surgery

## 2017-06-11 ENCOUNTER — Ambulatory Visit (INDEPENDENT_AMBULATORY_CARE_PROVIDER_SITE_OTHER): Payer: Medicare HMO | Admitting: Orthopedic Surgery

## 2017-06-11 DIAGNOSIS — M48061 Spinal stenosis, lumbar region without neurogenic claudication: Secondary | ICD-10-CM

## 2017-06-12 ENCOUNTER — Encounter (INDEPENDENT_AMBULATORY_CARE_PROVIDER_SITE_OTHER): Payer: Self-pay | Admitting: Orthopedic Surgery

## 2017-06-12 NOTE — Progress Notes (Signed)
Office Visit Note   Patient: [name]           Date of Birth: 1951/03/02           MRN: [id number] Visit Date: 06/11/2017 Requested by: Martinique, Betty G, MD [address] [place], [place] [address] PCP: Martinique, Betty G, MD  Subjective: Chief Complaint  Patient presents with  . Lower Back - Follow-up    HPI: [name] is a patient with low back pain.  Since I have seen her she has had MRI scan of her lumbar spine.  That scan shows right-sided disc at L2-3 and bilateral foraminal stenosis at L4-5.  Patient does report decreased walking endurance.  The imaging does not look particularly amenable to epidural steroid injections.  Patient wants to avoid unnecessary shots and would like to proceed with surgical intervention.  She has had a very good course of conservative treatment to date.              ROS: All systems reviewed are negative as they relate to the chief complaint within the history of present illness.  Patient denies  fevers or chills.   Assessment & Plan: Visit Diagnoses:  1. Spinal stenosis of lumbar region, unspecified whether neurogenic claudication present     Plan: Impression is right sided low back pain with cooperating MRI findings.  We discussed injections which she wants to defer.  I think she does have asymptomatic caudal disc extrusion on the right at L2-3 but also fairly significant severe multifactorial spinal stenosis at L4-5 which could also be contributing.  This is a complex multilevel lumbar spine problem.  Refer to neurosurgeon for further management  Follow-Up Instructions: Return if symptoms worsen or fail to improve.   Orders:  Orders Placed This Encounter  Procedures  . Ambulatory referral to Neurosurgery   No orders of the defined types were placed in this encounter.     Procedures: No procedures performed   Clinical Data: No additional findings.  Objective: Vital Signs: There were no vitals taken for this  visit.  Physical Exam:   Constitutional: Patient appears well-developed HEENT:  Head: Normocephalic Eyes:EOM are normal Neck: Normal range of motion Cardiovascular: Normal rate Pulmonary/chest: Effort normal Neurologic: Patient is alert Skin: Skin is warm Psychiatric: Patient has normal mood and affect    Ortho Exam: Orthopedic exam demonstrates good ankle dorsiflexion plantarflexion quad hamstring strength is symmetric reflexes and palpable pedal pulses.  Some pain with forward and lateral bending.  No trochanteric tenderness is noted.  No groin pain with internal/external rotation of the leg.  Reflexes symmetric bilateral patella and Achilles.  No muscle atrophy in the legs visualized.  Specialty Comments:  MRI Cervical Spine: Multilevel degenerative changes most notably at C4-5.Left shoulder x-ray: Mild degenerative arthropathy of the glenohumeral & AC Joint.  Imaging: No results found.   PMFS History: Patient Active Problem List   Diagnosis Date Noted  . Otitis media 12/28/2016  . External otitis of left ear 12/27/2016  . Dehydration 12/27/2016  . External otitis 12/27/2016  . DOE (dyspnea on exertion) 12/16/2016  . Family history of early CAD 12/16/2016  . Other chest pain 12/16/2016  . Arthritis of left acromioclavicular joint 08/13/2016  . Left shoulder pain 05/12/2016  . Cervical spondylosis without myelopathy 05/12/2016  . Rotator cuff syndrome, left 05/12/2016  . Restless leg syndrome 05/12/2016  . Primary osteoarthritis, left hand 05/12/2016  . MELENA 10/18/2008  . GASTROPARESIS 05/30/2008  . DM (diabetes mellitus), type  2 ([place]) 05/04/2008  . HYPERCHOLESTEROLEMIA 05/04/2008  . HYPOKALEMIA 05/04/2008  . MORBID OBESITY 05/04/2008  . ANXIETY DEPRESSION 05/04/2008  . GERD 05/04/2008  . Constipation 05/04/2008  . HEMATEMESIS 05/04/2008  . Essential hypertension 05/04/2008   Past Medical History:  Diagnosis Date  . Anxiety   . Arthritis   . Diabetes  mellitus without complication ([place])   . Hyperlipidemia   . Hypertension   . PTSD (post-traumatic stress disorder)     Family History  Problem Relation Age of Onset  . Diabetes Mother   . Alzheimer's disease Mother   . Lung cancer Father   . Esophageal cancer Brother   . Colon cancer Neg Hx     Past Surgical History:  Procedure Laterality Date  . ACHILLES TENDON SURGERY Left   . BACK SURGERY    . BLADDER SUSPENSION    . CHOLECYSTECTOMY    . NOSE SURGERY     x 2   . OTHER SURGICAL HISTORY     Breast Duct removed  . PARTIAL HYSTERECTOMY    . RECTOCELE REPAIR    . VEIN SURGERY Right    leg   Social History   Occupational History  . Not on file  Tobacco Use  . Smoking status: Never Smoker  . Smokeless tobacco: Never Used  Substance and Sexual Activity  . Alcohol use: No  . Drug use: No  . Sexual activity: Not on file

## 2017-06-16 ENCOUNTER — Other Ambulatory Visit: Payer: Self-pay | Admitting: Family Medicine

## 2017-06-16 DIAGNOSIS — F419 Anxiety disorder, unspecified: Secondary | ICD-10-CM

## 2017-06-16 DIAGNOSIS — F329 Major depressive disorder, single episode, unspecified: Secondary | ICD-10-CM

## 2017-06-16 DIAGNOSIS — F32A Depression, unspecified: Secondary | ICD-10-CM

## 2017-06-18 ENCOUNTER — Encounter: Payer: Self-pay | Admitting: Family Medicine

## 2017-06-19 ENCOUNTER — Other Ambulatory Visit: Payer: Self-pay | Admitting: Family Medicine

## 2017-06-19 MED ORDER — HYDROCHLOROTHIAZIDE 25 MG PO TABS: 25.0000 mg | ORAL_TABLET | Freq: Every day | ORAL | 2 refills | Status: DC

## 2017-07-01 ENCOUNTER — Ambulatory Visit (INDEPENDENT_AMBULATORY_CARE_PROVIDER_SITE_OTHER): Payer: Medicare HMO | Admitting: Family Medicine

## 2017-07-01 ENCOUNTER — Encounter: Payer: Self-pay | Admitting: Family Medicine

## 2017-07-01 VITALS — BP 120/76 | HR 88 | Temp 97.8°F | Resp 12 | Ht 68.0 in | Wt 190.2 lb

## 2017-07-01 DIAGNOSIS — E118 Type 2 diabetes mellitus with unspecified complications: Secondary | ICD-10-CM | POA: Diagnosis not present

## 2017-07-01 DIAGNOSIS — J069 Acute upper respiratory infection, unspecified: Secondary | ICD-10-CM | POA: Diagnosis not present

## 2017-07-01 DIAGNOSIS — J029 Acute pharyngitis, unspecified: Secondary | ICD-10-CM | POA: Diagnosis not present

## 2017-07-01 LAB — POCT RAPID STREP A (OFFICE): Rapid Strep A Screen: NEGATIVE

## 2017-07-01 MED ORDER — FLUTICASONE PROPIONATE 50 MCG/ACT NA SUSP: 2.0000 | Freq: Every day | NASAL | 2 refills | Status: DC

## 2017-07-01 MED ORDER — BENZONATATE 100 MG PO CAPS: 200.0000 mg | ORAL_CAPSULE | Freq: Two times a day (BID) | ORAL | 0 refills | Status: AC | PRN

## 2017-07-01 NOTE — Patient Instructions (Signed)
A few things to remember from today's visit:   Sore throat  URI, acute  viral infections are self-limited and we treat each symptom depending of severity.  Over the counter medications as decongestants and cold medications usually help, they need to be taken with caution if there is a history of high blood pressure or palpitations. Tylenol and/or Ibuprofen also helps with most symptoms (headache, muscle aching, fever,etc) Plenty of fluids. Honey helps with cough. Steam inhalations helps with runny nose, nasal congestion, and may prevent sinus infections. Cough and nasal congestion could last a few days and sometimes weeks. Please follow in not any better in 1-2 weeks or if symptoms get worse.  Please be sure medication list is accurate. If a new problem present, please set up appointment sooner than planned today.

## 2017-07-01 NOTE — Progress Notes (Signed)
ACUTE VISIT  HPI:  Chief Complaint  Patient presents with  . Sinusitis    started 3 days ago  . Left ear pain    [name] is a 67 y.o.female here today complaining of 3 days of gradual onset of respiratory symptoms. Sinus pressure and left ear "popping" for 3 days. She has not noted fever,chills,worsening fatigue or aches.  + Sore throat. She denies stridor or dysphagia. Exacerbated by swallowing.   Her granddaughter was sick recently.  "Little cough", most of the time non productive, occasionally she brings up "some phlegm."  Denies Hx of allergies but in the past she has been on OTC antihistaminics.   DM II:  BS's are "good." She will not be able to get [place], she doe snot longer qualifies for patient assistance. She states that she still has a few months left. Metformin caused diarrhea.    URI   This is a new problem. The current episode started in the past 7 days. The problem has been unchanged. There has been no fever. Associated symptoms include congestion, coughing, a plugged ear sensation, rhinorrhea and a sore throat. Pertinent negatives include no abdominal pain, chest pain, diarrhea, ear pain, headaches, nausea, rash, sneezing, swollen glands, vomiting or wheezing. She has tried nothing for the symptoms.    She has not tried OTC medication.     Review of Systems  Constitutional: Positive for fatigue. Negative for activity change, appetite change, chills and fever.  HENT: Positive for congestion, postnasal drip, rhinorrhea, sinus pressure and sore throat. Negative for ear pain, facial swelling, mouth sores, sneezing, trouble swallowing and voice change.   Eyes: Negative for discharge, redness and itching.  Respiratory: Positive for cough. Negative for shortness of breath and wheezing.   Cardiovascular: Negative for chest pain.  Gastrointestinal: Negative for abdominal pain, diarrhea, nausea and vomiting.  Endocrine: Negative for  polydipsia, polyphagia and polyuria.  Musculoskeletal: Positive for back pain (chronic). Negative for gait problem and myalgias.  Skin: Negative for rash.  Allergic/Immunologic: Positive for environmental allergies.  Neurological: Negative for weakness and headaches.  Hematological: Negative for adenopathy. Does not bruise/bleed easily.  Psychiatric/Behavioral: Negative for confusion. The patient is nervous/anxious.       Current Outpatient Medications on File Prior to Visit  Medication Sig Dispense Refill  . Biotin 10000 MCG TABS Take 10,000 mcg by mouth 2 (two) times a week.    . clonazePAM (KLONOPIN) 1 MG tablet TAKE ONE TABLET BY MOUTH TWICE A DAY AS NEEDED FOR ANXIETY (MAY REFILL 1/13 AND 2/13) 60 tablet 2  . dapagliflozin propanediol (FARXIGA) 10 MG TABS tablet Take 10 mg by mouth daily. 90 tablet 2  . diclofenac sodium (VOLTAREN) 1 % GEL Apply 2-4 g topically 4 (four) times daily.    Marland Kitchen gabapentin (NEURONTIN) 400 MG capsule Take 2 capsules (800 mg total) by mouth at bedtime. 60 capsule 4  . hydrochlorothiazide (HYDRODIURIL) 25 MG tablet Take 1 tablet (25 mg total) by mouth daily. 90 tablet 2  . HYDROcodone-acetaminophen (NORCO/VICODIN) 5-325 MG tablet Take 1 tablet by mouth every 8 (eight) hours as needed for moderate pain. 80 tablet 0  . hydrOXYzine (ATARAX/VISTARIL) 10 MG tablet     . methocarbamol (ROBAXIN) 500 MG tablet Take 1 tablet (500 mg total) by mouth every 8 (eight) hours as needed for muscle spasms. 35 tablet 0  . multivitamin-lutein (OCUVITE-LUTEIN) CAPS capsule Take 1 capsule by mouth daily.    . naloxegol oxalate (MOVANTIK) 25  MG TABS tablet Take 1 tablet (25 mg total) daily by mouth. 30 tablet 6  . omeprazole (PRILOSEC) 40 MG capsule Take 1 capsule (40 mg total) by mouth daily. 90 capsule 3  . ondansetron (ZOFRAN) 4 MG tablet Take 1 tablet (4 mg total) by mouth every 8 (eight) hours as needed for nausea or vomiting. 30 tablet 2  . potassium chloride SA (K-DUR,KLOR-CON)  20 MEQ tablet Take 1 tablet (20 mEq total) by mouth daily. 90 tablet 3  . rosuvastatin (CRESTOR) 10 MG tablet Take 10 mg by mouth daily.     . valACYclovir (VALTREX) 500 MG tablet As directed     No current facility-administered medications on file prior to visit.      Past Medical History:  Diagnosis Date  . Anxiety   . Arthritis   . Diabetes mellitus without complication ([place])   . Hyperlipidemia   . Hypertension   . PTSD (post-traumatic stress disorder)    Allergies  Allergen Reactions  . Dilaudid [Hydromorphone] Nausea And Vomiting and Other (See Comments)    Made the patient feel "spaced out," also  . Morphine And Related Itching  . Penicillins Rash    Has patient had a PCN reaction causing immediate rash, facial/tongue/throat swelling, SOB or lightheadedness with hypotension: Yes Has patient had a PCN reaction causing severe rash involving mucus membranes or skin necrosis: No Has patient had a PCN reaction that required hospitalization: No Has patient had a PCN reaction occurring within the last 10 years: No If all of the above answers are "NO", then may proceed with Cephalosporin use.     Social History   Socioeconomic History  . Marital status: Divorced    Spouse name: Not on file  . Number of children: Not on file  . Years of education: Not on file  . Highest education level: Not on file  Occupational History  . Not on file  Social Needs  . Financial resource strain: Not on file  . Food insecurity:    Worry: Not on file    Inability: Not on file  . Transportation needs:    Medical: Not on file    Non-medical: Not on file  Tobacco Use  . Smoking status: Never Smoker  . Smokeless tobacco: Never Used  Substance and Sexual Activity  . Alcohol use: No  . Drug use: No  . Sexual activity: Not on file  Lifestyle  . Physical activity:    Days per week: Not on file    Minutes per session: Not on file  . Stress: Not on file  Relationships  . Social  connections:    Talks on phone: Not on file    Gets together: Not on file    Attends religious service: Not on file    Active member of club or organization: Not on file    Attends meetings of clubs or organizations: Not on file    Relationship status: Not on file  Other Topics Concern  . Not on file  Social History Narrative  . Not on file    Vitals:   07/01/17 1434  BP: 120/76  Pulse: 88  Resp: 12  Temp: 97.8 F (36.6 C)  SpO2: 98%   Body mass index is 28.93 kg/m.   Physical Exam  Nursing note and vitals reviewed. Constitutional: She is oriented to person, place, and time. She appears well-developed. She does not appear ill. No distress.  HENT:  Head: Normocephalic and atraumatic.  Right Ear: Tympanic  membrane, external ear and ear canal normal.  Left Ear: Tympanic membrane, external ear and ear canal normal.  Nose: Rhinorrhea present. Right sinus exhibits no maxillary sinus tenderness and no frontal sinus tenderness. Left sinus exhibits no maxillary sinus tenderness and no frontal sinus tenderness.  Mouth/Throat: Uvula is midline and mucous membranes are normal. Posterior oropharyngeal erythema present. No oropharyngeal exudate or posterior oropharyngeal edema.  Post nasal drainage. Nasal voice.  Eyes: Conjunctivae are normal.  Neck: No muscular tenderness present. No edema and no erythema present.  Cardiovascular: Normal rate and regular rhythm.  No murmur heard. Respiratory: Effort normal and breath sounds normal. No stridor. No respiratory distress.  Lymphadenopathy:       Head (right side): No submandibular adenopathy present.       Head (left side): No submandibular adenopathy present.    She has cervical adenopathy.       Right cervical: Posterior cervical adenopathy present.       Left cervical: Posterior cervical adenopathy present.  Neurological: She is alert and oriented to person, place, and time. She has normal strength. Gait normal.  Skin: Skin is  warm. No rash noted. No erythema.  Psychiatric: Her mood appears anxious.  Well groomed, good eye contact.    ASSESSMENT AND PLAN:   Ms. Lesle was seen today for sinusitis and left ear pain.  Diagnoses and all orders for this visit:  Sore throat  Rapid strep test here in the office negative. We will follow Cx. Symptomatic treatment recommended.  -     POC Rapid Strep A -     Culture, Group A Strep  URI, acute   Symptoms suggests a viral etiology,  symptomatic treatment recommended. I do not think abx is needed at this time. Instructed to monitor for signs of complications, including new onset of fever among some, clearly instructed about warning signs. I also explained that cough and nasal congestion can last a few days and sometimes weeks. F/U as needed.  -     benzonatate (TESSALON) 100 MG capsule; Take 2 capsules (200 mg total) by mouth 2 (two) times daily as needed for up to 10 days. -     fluticasone (FLONASE) 50 MCG/ACT nasal spray; Place 2 sprays into both nostrils daily.   -     POC Rapid Strep A -     Culture, Group A Strep   Type 2 diabetes mellitus with complication, without long-term current use of insulin (HCC)  Instructed to find out which medications are covered under her plan, so we can plan before she run out of [place]. Keep f/u appt.    -Ms. Shari Natt Czaja advised to seek attention immediately if symptoms worsen or to follow if they persist or new concerns arise.       Betty G. Martinique, MD  [facility]. [place] office.

## 2017-07-02 ENCOUNTER — Encounter: Payer: Self-pay | Admitting: Family Medicine

## 2017-07-03 LAB — CULTURE, GROUP A STREP
MICRO NUMBER:: 90382567
SPECIMEN QUALITY:: ADEQUATE

## 2017-07-05 ENCOUNTER — Encounter: Payer: Self-pay | Admitting: Family Medicine

## 2017-07-06 ENCOUNTER — Other Ambulatory Visit: Payer: Self-pay | Admitting: *Deleted

## 2017-07-06 MED ORDER — CEPHALEXIN 500 MG PO CAPS: 500.0000 mg | ORAL_CAPSULE | Freq: Two times a day (BID) | ORAL | 0 refills | Status: AC

## 2017-07-08 ENCOUNTER — Encounter: Payer: Medicare HMO | Attending: Physical Medicine & Rehabilitation | Admitting: Physical Medicine & Rehabilitation

## 2017-07-08 ENCOUNTER — Encounter: Payer: Self-pay | Admitting: Physical Medicine & Rehabilitation

## 2017-07-08 ENCOUNTER — Ambulatory Visit: Payer: Medicare Other | Admitting: Registered Nurse

## 2017-07-08 ENCOUNTER — Ambulatory Visit: Payer: Medicare Other | Admitting: Physical Medicine & Rehabilitation

## 2017-07-08 VITALS — BP 120/83 | HR 87 | Resp 14 | Ht 68.0 in | Wt 190.0 lb

## 2017-07-08 DIAGNOSIS — E785 Hyperlipidemia, unspecified: Secondary | ICD-10-CM | POA: Diagnosis not present

## 2017-07-08 DIAGNOSIS — M5030 Other cervical disc degeneration, unspecified cervical region: Secondary | ICD-10-CM | POA: Diagnosis not present

## 2017-07-08 DIAGNOSIS — R102 Pelvic and perineal pain: Secondary | ICD-10-CM | POA: Insufficient documentation

## 2017-07-08 DIAGNOSIS — M48061 Spinal stenosis, lumbar region without neurogenic claudication: Secondary | ICD-10-CM | POA: Diagnosis not present

## 2017-07-08 DIAGNOSIS — M542 Cervicalgia: Secondary | ICD-10-CM | POA: Insufficient documentation

## 2017-07-08 DIAGNOSIS — M19042 Primary osteoarthritis, left hand: Secondary | ICD-10-CM | POA: Diagnosis not present

## 2017-07-08 DIAGNOSIS — G894 Chronic pain syndrome: Secondary | ICD-10-CM | POA: Diagnosis not present

## 2017-07-08 DIAGNOSIS — G2581 Restless legs syndrome: Secondary | ICD-10-CM | POA: Diagnosis not present

## 2017-07-08 DIAGNOSIS — M545 Low back pain: Secondary | ICD-10-CM | POA: Diagnosis not present

## 2017-07-08 DIAGNOSIS — F4310 Post-traumatic stress disorder, unspecified: Secondary | ICD-10-CM | POA: Diagnosis not present

## 2017-07-08 DIAGNOSIS — M25512 Pain in left shoulder: Secondary | ICD-10-CM | POA: Insufficient documentation

## 2017-07-08 DIAGNOSIS — G8929 Other chronic pain: Secondary | ICD-10-CM | POA: Diagnosis not present

## 2017-07-08 DIAGNOSIS — M75102 Unspecified rotator cuff tear or rupture of left shoulder, not specified as traumatic: Secondary | ICD-10-CM

## 2017-07-08 DIAGNOSIS — E119 Type 2 diabetes mellitus without complications: Secondary | ICD-10-CM | POA: Diagnosis not present

## 2017-07-08 DIAGNOSIS — I10 Essential (primary) hypertension: Secondary | ICD-10-CM | POA: Diagnosis not present

## 2017-07-08 DIAGNOSIS — Z5181 Encounter for therapeutic drug level monitoring: Secondary | ICD-10-CM | POA: Diagnosis not present

## 2017-07-08 DIAGNOSIS — F329 Major depressive disorder, single episode, unspecified: Secondary | ICD-10-CM | POA: Insufficient documentation

## 2017-07-08 DIAGNOSIS — M19012 Primary osteoarthritis, left shoulder: Secondary | ICD-10-CM | POA: Diagnosis not present

## 2017-07-08 DIAGNOSIS — Z79891 Long term (current) use of opiate analgesic: Secondary | ICD-10-CM | POA: Diagnosis not present

## 2017-07-08 DIAGNOSIS — T1490XA Injury, unspecified, initial encounter: Secondary | ICD-10-CM | POA: Diagnosis not present

## 2017-07-08 MED ORDER — METHOCARBAMOL 500 MG PO TABS: 500.0000 mg | ORAL_TABLET | Freq: Three times a day (TID) | ORAL | 2 refills | Status: DC | PRN

## 2017-07-08 MED ORDER — HYDROCODONE-ACETAMINOPHEN 5-325 MG PO TABS: 1.0000 | ORAL_TABLET | Freq: Three times a day (TID) | ORAL | 0 refills | Status: DC | PRN
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
## 2017-07-08 NOTE — Patient Instructions (Signed)
PLEASE FEEL FREE TO CALL OUR OFFICE WITH ANY PROBLEMS OR QUESTIONS ([phone])      

## 2017-07-09 DIAGNOSIS — M5440 Lumbago with sciatica, unspecified side: Secondary | ICD-10-CM | POA: Diagnosis not present

## 2017-07-09 DIAGNOSIS — M5126 Other intervertebral disc displacement, lumbar region: Secondary | ICD-10-CM | POA: Diagnosis not present

## 2017-07-09 DIAGNOSIS — M4807 Spinal stenosis, lumbosacral region: Secondary | ICD-10-CM | POA: Diagnosis not present

## 2017-07-13 ENCOUNTER — Other Ambulatory Visit: Payer: Self-pay | Admitting: Family Medicine

## 2017-07-13 DIAGNOSIS — H524 Presbyopia: Secondary | ICD-10-CM | POA: Diagnosis not present

## 2017-07-13 DIAGNOSIS — I10 Essential (primary) hypertension: Secondary | ICD-10-CM | POA: Diagnosis not present

## 2017-07-13 DIAGNOSIS — H52223 Regular astigmatism, bilateral: Secondary | ICD-10-CM | POA: Diagnosis not present

## 2017-07-13 DIAGNOSIS — H5203 Hypermetropia, bilateral: Secondary | ICD-10-CM | POA: Diagnosis not present

## 2017-07-13 DIAGNOSIS — E139 Other specified diabetes mellitus without complications: Secondary | ICD-10-CM | POA: Diagnosis not present

## 2017-07-13 DIAGNOSIS — H2513 Age-related nuclear cataract, bilateral: Secondary | ICD-10-CM | POA: Diagnosis not present

## 2017-07-13 DIAGNOSIS — G2581 Restless legs syndrome: Secondary | ICD-10-CM

## 2017-07-14 ENCOUNTER — Telehealth: Payer: Self-pay | Admitting: *Deleted

## 2017-07-14 ENCOUNTER — Encounter: Payer: Self-pay | Admitting: Family Medicine

## 2017-07-14 LAB — TOXASSURE SELECT,+ANTIDEPR,UR

## 2017-07-14 NOTE — Telephone Encounter (Signed)
Urine drug screen for this encounter is consistent for prescribed medication.  Last dose of hydrocodone was stated as 06/24/17 so being absent is expected.  Clonazepam taken 07/07/17 and metabolites are present.

## 2017-07-16 ENCOUNTER — Encounter: Payer: Self-pay | Admitting: Family Medicine

## 2017-07-29 ENCOUNTER — Telehealth: Payer: Self-pay | Admitting: Physical Medicine & Rehabilitation

## 2017-07-29 NOTE — Telephone Encounter (Signed)
Patient phoned to let us know that she received 2 phone calls from our office. I explained to her that she may have an up coming appointment in our office.  She does have one on 09/02/17 with Dr. Naaman Plummer.  She then went into detail as to why does she need to come back because she is having back surgery and I stated to her that Dr. Naaman Plummer wanted to see her back in a month.  She then went into to telling me how many specialist office that she has seen this month and that she may not have the $35 co pay.  I did offer to reschedule her but she said no I am going to come and I did tell her that unfortunately that every time that she visits our office she does have to pay her specialist co pay.  She then said I know that, the last time I spoke with you that I hurt her feelings, and I wasn't the one that she spoke with, and again I explained to her that she has an obligation with her insurance company that she has to pay the specialist co pay.  She said that is fine and hung up on me.  Sybil was standing at my desk the whole time that I was speaking with patient on phone.  I wasn't being hateful to her, I was just stating our office policy.

## 2017-08-02 ENCOUNTER — Encounter: Payer: Self-pay | Admitting: Family Medicine

## 2017-08-03 ENCOUNTER — Other Ambulatory Visit: Payer: Self-pay | Admitting: *Deleted

## 2017-08-03 ENCOUNTER — Encounter (HOSPITAL_BASED_OUTPATIENT_CLINIC_OR_DEPARTMENT_OTHER): Payer: Medicare HMO | Admitting: Physical Medicine & Rehabilitation

## 2017-08-03 ENCOUNTER — Encounter: Payer: Self-pay | Admitting: Physical Medicine & Rehabilitation

## 2017-08-03 VITALS — BP 167/90 | HR 85 | Ht 68.0 in | Wt 192.0 lb

## 2017-08-03 DIAGNOSIS — M75102 Unspecified rotator cuff tear or rupture of left shoulder, not specified as traumatic: Secondary | ICD-10-CM | POA: Diagnosis not present

## 2017-08-03 DIAGNOSIS — G2581 Restless legs syndrome: Secondary | ICD-10-CM | POA: Diagnosis not present

## 2017-08-03 DIAGNOSIS — G8929 Other chronic pain: Secondary | ICD-10-CM | POA: Diagnosis not present

## 2017-08-03 DIAGNOSIS — T1490XA Injury, unspecified, initial encounter: Secondary | ICD-10-CM | POA: Diagnosis not present

## 2017-08-03 DIAGNOSIS — M25512 Pain in left shoulder: Secondary | ICD-10-CM | POA: Diagnosis not present

## 2017-08-03 DIAGNOSIS — M545 Low back pain: Secondary | ICD-10-CM | POA: Diagnosis not present

## 2017-08-03 DIAGNOSIS — G894 Chronic pain syndrome: Secondary | ICD-10-CM

## 2017-08-03 DIAGNOSIS — R102 Pelvic and perineal pain: Secondary | ICD-10-CM | POA: Diagnosis not present

## 2017-08-03 DIAGNOSIS — M48061 Spinal stenosis, lumbar region without neurogenic claudication: Secondary | ICD-10-CM | POA: Diagnosis not present

## 2017-08-03 DIAGNOSIS — F4310 Post-traumatic stress disorder, unspecified: Secondary | ICD-10-CM | POA: Diagnosis not present

## 2017-08-03 DIAGNOSIS — M5030 Other cervical disc degeneration, unspecified cervical region: Secondary | ICD-10-CM | POA: Diagnosis not present

## 2017-08-03 DIAGNOSIS — M542 Cervicalgia: Secondary | ICD-10-CM | POA: Diagnosis not present

## 2017-08-03 MED ORDER — HYDROCODONE-ACETAMINOPHEN 5-325 MG PO TABS: 1.0000 | ORAL_TABLET | Freq: Four times a day (QID) | ORAL | 0 refills | Status: DC | PRN

## 2017-08-03 MED ORDER — ROSUVASTATIN CALCIUM 10 MG PO TABS: 10.0000 mg | ORAL_TABLET | Freq: Every day | ORAL | 2 refills | Status: DC

## 2017-08-03 MED ORDER — METHOCARBAMOL 500 MG PO TABS: 500.0000 mg | ORAL_TABLET | Freq: Four times a day (QID) | ORAL | 2 refills | Status: DC | PRN
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
## 2017-08-03 NOTE — Patient Instructions (Signed)
PLEASE FEEL FREE TO CALL OUR OFFICE WITH ANY PROBLEMS OR QUESTIONS ([phone])      

## 2017-08-05 ENCOUNTER — Ambulatory Visit: Payer: Medicare HMO | Admitting: Physical Medicine & Rehabilitation

## 2017-08-05 DIAGNOSIS — M48061 Spinal stenosis, lumbar region without neurogenic claudication: Secondary | ICD-10-CM | POA: Diagnosis not present

## 2017-08-05 DIAGNOSIS — M47816 Spondylosis without myelopathy or radiculopathy, lumbar region: Secondary | ICD-10-CM | POA: Diagnosis not present

## 2017-08-05 DIAGNOSIS — M5126 Other intervertebral disc displacement, lumbar region: Secondary | ICD-10-CM | POA: Diagnosis not present

## 2017-08-06 ENCOUNTER — Encounter: Payer: Self-pay | Admitting: Family Medicine

## 2017-08-07 ENCOUNTER — Encounter: Payer: Self-pay | Admitting: Family Medicine

## 2017-08-07 ENCOUNTER — Other Ambulatory Visit: Payer: Self-pay | Admitting: *Deleted

## 2017-08-07 DIAGNOSIS — G2581 Restless legs syndrome: Secondary | ICD-10-CM

## 2017-08-11 ENCOUNTER — Other Ambulatory Visit: Payer: Self-pay | Admitting: *Deleted

## 2017-08-11 DIAGNOSIS — E1165 Type 2 diabetes mellitus with hyperglycemia: Secondary | ICD-10-CM

## 2017-08-11 MED ORDER — ACCU-CHEK AVIVA PLUS W/DEVICE KIT: PACK | 1 refills | Status: DC

## 2017-08-11 MED ORDER — ACCU-CHEK AVIVA VI SOLN: 2 refills | Status: DC

## 2017-08-11 MED ORDER — ROSUVASTATIN CALCIUM 10 MG PO TABS: 10.0000 mg | ORAL_TABLET | Freq: Every day | ORAL | 2 refills | Status: DC

## 2017-08-11 MED ORDER — GLUCOSE BLOOD VI STRP: ORAL_STRIP | 12 refills | Status: DC

## 2017-08-11 MED ORDER — BD SWAB SINGLE USE REGULAR PADS
MEDICATED_PAD | 3 refills | Status: AC
Start: 2017-08-11 — End: ?

## 2017-08-11 MED ORDER — ACCU-CHEK SOFTCLIX LANCETS MISC
12 refills | Status: DC
Start: 2017-08-11 — End: 2021-09-04

## 2017-08-12 ENCOUNTER — Other Ambulatory Visit: Payer: Self-pay | Admitting: *Deleted

## 2017-08-12 DIAGNOSIS — G2581 Restless legs syndrome: Secondary | ICD-10-CM

## 2017-08-12 MED ORDER — HYDROCHLOROTHIAZIDE 25 MG PO TABS: 25.0000 mg | ORAL_TABLET | Freq: Every day | ORAL | 3 refills | Status: DC

## 2017-08-12 MED ORDER — GABAPENTIN 400 MG PO CAPS: 800.0000 mg | ORAL_CAPSULE | Freq: Every day | ORAL | 0 refills | Status: DC

## 2017-08-12 MED ORDER — OMEPRAZOLE 40 MG PO CPDR: 40.0000 mg | DELAYED_RELEASE_CAPSULE | Freq: Every day | ORAL | 3 refills | Status: DC

## 2017-08-13 ENCOUNTER — Encounter: Payer: Self-pay | Admitting: Family Medicine

## 2017-08-18 NOTE — Progress Notes (Signed)
HPI:   [name] is a 67 y.o. female, who is here today for 6 months follow up.   Sine her last visit she has undergone lumbar spine surgery, she is very satisfied with outcome.  She continues following with Dr Naaman Plummer for chronic pain management.   Diabetes Mellitus II:    Currently on Farxiga 10 mg daily, took last tab today. She has been on same medication for about 5 years,has had pt assistance but will not be able to continue getting medication for free. She cannot afford it.   Checking BS's : FG 130's-mid 200's. Post prandial, 15 min after a meal, 200's.  Hypoglycemia:Denies.  She is tolerating medications well. Negative for abdominal pain, nausea, vomiting, polydipsia, polyuria, or polyphagia. + Numbness right foot since 04/2017, 2-3 week before she had back surgery. According to pt, neurosx is planning on nerve study.  Gabapentin is not helping,takes 400 mg 3 times per day. According to pt, neurosurgeon is planning on starting work-up next visit, "nerve test."   Lab Results  Component Value Date   HGBA1C 7.7 (H) 05/18/2017   Lab Results  Component Value Date   MICROALBUR <0.7 05/18/2017   She would like to try diabetic education ,so she can work on her diet.  Hypertension:   Currently on HCTZ 25 mg daily  No side effects reported.  She has not noted unusual headache, visual changes, exertional chest pain, dyspnea, focal weakness, or edema. HypoK+, she is not longer on K+ supplementation.   Lab Results  Component Value Date   CREATININE 0.94 05/18/2017   BUN 14 05/18/2017   NA 141 05/18/2017   K 4.1 05/18/2017   CL 99 05/18/2017   CO2 30 05/18/2017    Anxiety: Currently she is on clonazepam 1 mg twice daily as needed. She is going through a lot stress. Mother will be going to NH facility. Fiance is planning on starting radiation, currently on chemo.  No suicidal thoughts.   Review of Systems  Constitutional: Positive for  fatigue. Negative for activity change, appetite change and fever.  HENT: Negative for mouth sores, nosebleeds and trouble swallowing.   Eyes: Negative for redness and visual disturbance.  Respiratory: Negative for cough, shortness of breath and wheezing.   Cardiovascular: Negative for chest pain, palpitations and leg swelling.  Gastrointestinal: Negative for abdominal pain, nausea and vomiting.       Negative for changes in bowel habits.  Endocrine: Negative for polydipsia, polyphagia and polyuria.  Genitourinary: Negative for decreased urine volume, dysuria and hematuria.  Musculoskeletal: Positive for back pain. Negative for gait problem.  Skin: Negative for rash and wound.  Neurological: Positive for numbness. Negative for syncope, weakness and headaches.  Psychiatric/Behavioral: Negative for confusion. The patient is nervous/anxious.      Current Outpatient Medications on File Prior to Visit  Medication Sig Dispense Refill  . ACCU-CHEK SOFTCLIX LANCETS lancets Use to test blood sugar 3 times daily. Dx: E11.8 100 each 12  . Alcohol Swabs (B-D SINGLE USE SWABS REGULAR) PADS Use to test blood sugar 3 times daily 100 each 3  . Biotin 10000 MCG TABS Take 10,000 mcg by mouth 2 (two) times a week.    . Blood Glucose Calibration (ACCU-CHEK AVIVA) SOLN Use as directed 1 each 2  . Blood Glucose Monitoring Suppl (ACCU-CHEK AVIVA PLUS) w/Device KIT Use to check blood sugar 3 times daily 1 kit 1  . clonazePAM (KLONOPIN) 1 MG tablet TAKE ONE TABLET BY  MOUTH TWICE A DAY AS NEEDED FOR ANXIETY (MAY REFILL 1/13 AND 2/13) 60 tablet 2  . dapagliflozin propanediol (FARXIGA) 10 MG TABS tablet Take 10 mg by mouth daily. 90 tablet 2  . fluticasone (FLONASE) 50 MCG/ACT nasal spray Place 2 sprays into both nostrils daily. 16 g 2  . gabapentin (NEURONTIN) 400 MG capsule Take 2 capsules (800 mg total) by mouth at bedtime. 60 capsule 0  . glucose blood (ACCU-CHEK AVIVA) test strip Use to test blood sugar 3 times  daily. Dx: 11.8 100 each 12  . hydrochlorothiazide (HYDRODIURIL) 25 MG tablet Take 1 tablet (25 mg total) by mouth daily. 90 tablet 3  . HYDROcodone-acetaminophen (NORCO/VICODIN) 5-325 MG tablet Take 1 tablet by mouth every 6 (six) hours as needed for moderate pain. 100 tablet 0  . methocarbamol (ROBAXIN) 500 MG tablet Take 1 tablet (500 mg total) by mouth every 6 (six) hours as needed for muscle spasms. 120 tablet 2  . multivitamin-lutein (OCUVITE-LUTEIN) CAPS capsule Take 1 capsule by mouth daily.    Marland Kitchen omeprazole (PRILOSEC) 40 MG capsule Take 1 capsule (40 mg total) by mouth daily. 90 capsule 3  . rosuvastatin (CRESTOR) 10 MG tablet Take 1 tablet (10 mg total) by mouth daily. 90 tablet 2   No current facility-administered medications on file prior to visit.      Past Medical History:  Diagnosis Date  . Anxiety   . Arthritis   . Diabetes mellitus without complication ([place])   . Hyperlipidemia   . Hypertension   . PTSD (post-traumatic stress disorder)    Allergies  Allergen Reactions  . Dilaudid [Hydromorphone] Nausea And Vomiting and Other (See Comments)    Made the patient feel "spaced out," also  . Morphine And Related Itching  . Penicillins Rash    Has patient had a PCN reaction causing immediate rash, facial/tongue/throat swelling, SOB or lightheadedness with hypotension: Yes Has patient had a PCN reaction causing severe rash involving mucus membranes or skin necrosis: No Has patient had a PCN reaction that required hospitalization: No Has patient had a PCN reaction occurring within the last 10 years: No If all of the above answers are "NO", then may proceed with Cephalosporin use.     Social History   Socioeconomic History  . Marital status: Divorced    Spouse name: Not on file  . Number of children: Not on file  . Years of education: Not on file  . Highest education level: Not on file  Occupational History  . Not on file  Social Needs  . Financial resource strain:  Not on file  . Food insecurity:    Worry: Not on file    Inability: Not on file  . Transportation needs:    Medical: Not on file    Non-medical: Not on file  Tobacco Use  . Smoking status: Never Smoker  . Smokeless tobacco: Never Used  Substance and Sexual Activity  . Alcohol use: No  . Drug use: No  . Sexual activity: Not on file  Lifestyle  . Physical activity:    Days per week: Not on file    Minutes per session: Not on file  . Stress: Not on file  Relationships  . Social connections:    Talks on phone: Not on file    Gets together: Not on file    Attends religious service: Not on file    Active member of club or organization: Not on file    Attends meetings of clubs  or organizations: Not on file    Relationship status: Not on file  Other Topics Concern  . Not on file  Social History Narrative  . Not on file    Vitals:   08/19/17 1422  BP: 120/80  Pulse: 92  Resp: 12  Temp: 98.5 F (36.9 C)  SpO2: 97%   Body mass index is 28.91 kg/m.   Physical Exam  Nursing note and vitals reviewed. Constitutional: She is oriented to person, place, and time. She appears well-developed. No distress.  HENT:  Head: Normocephalic and atraumatic.  Mouth/Throat: Oropharynx is clear and moist and mucous membranes are normal.  Eyes: Pupils are equal, round, and reactive to light. Conjunctivae are normal.  Cardiovascular: Normal rate and regular rhythm.  No murmur heard. Pulses:      Dorsalis pedis pulses are 2+ on the right side, and 2+ on the left side.  Respiratory: Effort normal and breath sounds normal. No respiratory distress.  GI: Soft. She exhibits no mass. There is no hepatomegaly. There is no tenderness.  Musculoskeletal: She exhibits no edema.  Lymphadenopathy:    She has no cervical adenopathy.  Neurological: She is alert and oriented to person, place, and time. She has normal strength. Gait normal.  Skin: Skin is warm. No rash noted. No erythema.  Psychiatric:  Her mood appears anxious.  Well groomed, good eye contact.     ASSESSMENT AND PLAN:   [name] was seen today for 6 months follow-up.  Orders Placed This Encounter  Procedures  . Hemoglobin A1c  . Basic metabolic panel  . Magnesium  . Ambulatory referral to diabetic education   Lab Results  Component Value Date   HGBA1C 7.4 (H) 08/19/2017   Lab Results  Component Value Date   CREATININE 0.76 08/19/2017   BUN 11 08/19/2017   NA 140 08/19/2017   K 4.7 08/19/2017   CL 101 08/19/2017   CO2 31 08/19/2017    1. Type 2 diabetes mellitus with neurological complications ([place])  [id number] pending. She is willing to try Metformin again,in the past med caused some GI side effects.  Regular exercise and healthy diet with avoidance of added sugar food intake is an important part of treatment and recommended. Annual eye exam, periodic dental and foot care discussed. F/U in 5-6 months  - metFORMIN (GLUCOPHAGE) 500 MG tablet; Take 1 tablet (500 mg total) by mouth 2 (two) times daily with a meal.  Dispense: 180 tablet; Refill: 1 - Hemoglobin A1c - Ambulatory referral to diabetic education  2. Essential hypertension  Adequately controlled. No changes in current management. Low salt diet to continue. Eye exam current. F/U in 6 months, before if needed.   3. ANXIETY DEPRESSION  Stable overall, she is under some stress but seems to be dealing well with it. No changes in current management. F/U in 5-6 months.   4. Numbness of foot  Possible causes discussed,most likely related to DM.   5. HYPOKALEMIA  Further recommendations will be given according to lab results.  - Basic metabolic panel - Magnesium      -[name] is to return sooner than planned today if new concerns arise.       Camela Wich G. [place], MD  [facility]. [place] office.

## 2017-08-19 ENCOUNTER — Ambulatory Visit (INDEPENDENT_AMBULATORY_CARE_PROVIDER_SITE_OTHER): Payer: Medicare HMO | Admitting: Family Medicine

## 2017-08-19 ENCOUNTER — Encounter: Payer: Self-pay | Admitting: Family Medicine

## 2017-08-19 VITALS — BP 120/80 | HR 92 | Temp 98.5°F | Resp 12 | Ht 68.0 in | Wt 190.1 lb

## 2017-08-19 DIAGNOSIS — R200 Anesthesia of skin: Secondary | ICD-10-CM

## 2017-08-19 DIAGNOSIS — I10 Essential (primary) hypertension: Secondary | ICD-10-CM

## 2017-08-19 DIAGNOSIS — F341 Dysthymic disorder: Secondary | ICD-10-CM

## 2017-08-19 DIAGNOSIS — E1149 Type 2 diabetes mellitus with other diabetic neurological complication: Secondary | ICD-10-CM

## 2017-08-19 DIAGNOSIS — E876 Hypokalemia: Secondary | ICD-10-CM

## 2017-08-19 DIAGNOSIS — F329 Major depressive disorder, single episode, unspecified: Secondary | ICD-10-CM

## 2017-08-19 DIAGNOSIS — F419 Anxiety disorder, unspecified: Secondary | ICD-10-CM | POA: Diagnosis not present

## 2017-08-19 DIAGNOSIS — F32A Depression, unspecified: Secondary | ICD-10-CM

## 2017-08-19 LAB — BASIC METABOLIC PANEL
BUN: 11 mg/dL (ref 6–23)
CO2: 31 mEq/L (ref 19–32)
Calcium: 9.9 mg/dL (ref 8.4–10.5)
Chloride: 101 mEq/L (ref 96–112)
Creatinine, Ser: 0.76 mg/dL (ref 0.40–1.20)
GFR: 81.02 mL/min (ref >60.00)
Glucose, Bld: 166 mg/dL — ABNORMAL HIGH (ref 70–99)
Potassium: 4.7 mEq/L (ref 3.5–5.1)
Sodium: 140 mEq/L (ref 135–145)

## 2017-08-19 LAB — HEMOGLOBIN A1C: Hgb A1c MFr Bld: 7.4 % — ABNORMAL HIGH (ref 4.6–6.5)

## 2017-08-19 LAB — MAGNESIUM: Magnesium: 1.8 mg/dL (ref 1.5–2.5)

## 2017-08-19 MED ORDER — METFORMIN HCL 500 MG PO TABS: 500.0000 mg | ORAL_TABLET | Freq: Two times a day (BID) | ORAL | 1 refills | Status: DC

## 2017-08-19 NOTE — Patient Instructions (Signed)
A few things to remember from today's visit:   Type 2 diabetes mellitus with neurological complications ([place]) - Plan: metFORMIN (GLUCOPHAGE) 500 MG tablet, Hemoglobin A1c  Essential hypertension  ANXIETY DEPRESSION  Numbness of foot  HYPOKALEMIA - Plan: Basic metabolic panel, Magnesium  Metformin started today.   Foot care.  Please be sure medication list is accurate. If a new problem present, please set up appointment sooner than planned today.

## 2017-08-20 ENCOUNTER — Encounter: Payer: Self-pay | Admitting: Family Medicine

## 2017-08-20 MED ORDER — CLONAZEPAM 1 MG PO TABS: ORAL_TABLET | ORAL | 3 refills | Status: DC

## 2017-08-21 ENCOUNTER — Encounter: Payer: Self-pay | Admitting: Family Medicine

## 2017-09-05 ENCOUNTER — Other Ambulatory Visit: Payer: Self-pay | Admitting: Family Medicine

## 2017-09-05 DIAGNOSIS — G2581 Restless legs syndrome: Secondary | ICD-10-CM

## 2017-09-07 ENCOUNTER — Encounter: Payer: Self-pay | Admitting: Physical Medicine & Rehabilitation

## 2017-09-07 ENCOUNTER — Encounter: Payer: Medicare HMO | Attending: Physical Medicine & Rehabilitation | Admitting: Physical Medicine & Rehabilitation

## 2017-09-07 VITALS — BP 115/75 | HR 83 | Ht 68.0 in | Wt 194.0 lb

## 2017-09-07 DIAGNOSIS — T1490XA Injury, unspecified, initial encounter: Secondary | ICD-10-CM | POA: Insufficient documentation

## 2017-09-07 DIAGNOSIS — F4310 Post-traumatic stress disorder, unspecified: Secondary | ICD-10-CM | POA: Insufficient documentation

## 2017-09-07 DIAGNOSIS — G894 Chronic pain syndrome: Secondary | ICD-10-CM

## 2017-09-07 DIAGNOSIS — F329 Major depressive disorder, single episode, unspecified: Secondary | ICD-10-CM | POA: Diagnosis not present

## 2017-09-07 DIAGNOSIS — M7918 Myalgia, other site: Secondary | ICD-10-CM | POA: Diagnosis not present

## 2017-09-07 DIAGNOSIS — E785 Hyperlipidemia, unspecified: Secondary | ICD-10-CM | POA: Diagnosis not present

## 2017-09-07 DIAGNOSIS — G8929 Other chronic pain: Secondary | ICD-10-CM | POA: Insufficient documentation

## 2017-09-07 DIAGNOSIS — G2581 Restless legs syndrome: Secondary | ICD-10-CM | POA: Diagnosis not present

## 2017-09-07 DIAGNOSIS — M25512 Pain in left shoulder: Secondary | ICD-10-CM | POA: Diagnosis not present

## 2017-09-07 DIAGNOSIS — M75102 Unspecified rotator cuff tear or rupture of left shoulder, not specified as traumatic: Secondary | ICD-10-CM

## 2017-09-07 DIAGNOSIS — E119 Type 2 diabetes mellitus without complications: Secondary | ICD-10-CM | POA: Insufficient documentation

## 2017-09-07 DIAGNOSIS — R102 Pelvic and perineal pain: Secondary | ICD-10-CM | POA: Diagnosis not present

## 2017-09-07 DIAGNOSIS — M48061 Spinal stenosis, lumbar region without neurogenic claudication: Secondary | ICD-10-CM

## 2017-09-07 DIAGNOSIS — M542 Cervicalgia: Secondary | ICD-10-CM | POA: Insufficient documentation

## 2017-09-07 DIAGNOSIS — I10 Essential (primary) hypertension: Secondary | ICD-10-CM | POA: Diagnosis not present

## 2017-09-07 DIAGNOSIS — M5030 Other cervical disc degeneration, unspecified cervical region: Secondary | ICD-10-CM | POA: Insufficient documentation

## 2017-09-07 DIAGNOSIS — M545 Low back pain: Secondary | ICD-10-CM | POA: Diagnosis not present

## 2017-09-07 MED ORDER — HYDROCODONE-ACETAMINOPHEN 5-325 MG PO TABS: 1.0000 | ORAL_TABLET | Freq: Four times a day (QID) | ORAL | 0 refills | Status: DC | PRN
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
## 2017-09-07 NOTE — Patient Instructions (Signed)
PLEASE FEEL FREE TO CALL OUR OFFICE WITH ANY PROBLEMS OR QUESTIONS ([phone])      

## 2017-09-08 ENCOUNTER — Other Ambulatory Visit: Payer: Self-pay | Admitting: Family Medicine

## 2017-09-08 DIAGNOSIS — G2581 Restless legs syndrome: Secondary | ICD-10-CM

## 2017-09-08 MED ORDER — GABAPENTIN 400 MG PO CAPS: 800.0000 mg | ORAL_CAPSULE | Freq: Every day | ORAL | 3 refills | Status: DC

## 2017-09-09 ENCOUNTER — Encounter: Payer: Self-pay | Admitting: Family Medicine

## 2017-09-09 DIAGNOSIS — E1351 Other specified diabetes mellitus with diabetic peripheral angiopathy without gangrene: Secondary | ICD-10-CM | POA: Diagnosis not present

## 2017-09-09 DIAGNOSIS — L602 Onychogryphosis: Secondary | ICD-10-CM | POA: Diagnosis not present

## 2017-09-09 DIAGNOSIS — M21961 Unspecified acquired deformity of right lower leg: Secondary | ICD-10-CM | POA: Diagnosis not present

## 2017-09-09 DIAGNOSIS — L03032 Cellulitis of left toe: Secondary | ICD-10-CM | POA: Diagnosis not present

## 2017-09-09 DIAGNOSIS — M21962 Unspecified acquired deformity of left lower leg: Secondary | ICD-10-CM | POA: Diagnosis not present

## 2017-09-10 ENCOUNTER — Ambulatory Visit (INDEPENDENT_AMBULATORY_CARE_PROVIDER_SITE_OTHER): Payer: Medicare HMO | Admitting: Adult Health

## 2017-09-10 ENCOUNTER — Encounter: Payer: Self-pay | Admitting: Family Medicine

## 2017-09-10 ENCOUNTER — Encounter: Payer: Self-pay | Admitting: Adult Health

## 2017-09-10 VITALS — BP 104/74 | Temp 98.0°F | Wt 192.0 lb

## 2017-09-10 DIAGNOSIS — B9789 Other viral agents as the cause of diseases classified elsewhere: Secondary | ICD-10-CM | POA: Diagnosis not present

## 2017-09-10 DIAGNOSIS — J329 Chronic sinusitis, unspecified: Secondary | ICD-10-CM

## 2017-09-10 NOTE — Progress Notes (Signed)
Subjective:    Patient ID: [name], female    DOB: 06/03/1950, 67 y.o.   MRN: [id number]  URI   This is a new problem. The current episode started in the past 7 days (2 days ). The problem has been waxing and waning (improving today ). There has been no fever. Associated symptoms include congestion, coughing (semi productive ), a plugged ear sensation (left ), rhinorrhea and a sore throat (scratchy ). Pertinent negatives include no abdominal pain, ear pain, headaches, nausea, neck pain, sinus pain or wheezing. Treatments tried: Flonase  The treatment provided no relief.     Review of Systems  Constitutional: Negative.   HENT: Positive for congestion, rhinorrhea, sore throat (scratchy ) and voice change. Negative for ear pain, sinus pressure, sinus pain and trouble swallowing.   Eyes: Negative.   Respiratory: Positive for cough (semi productive ). Negative for wheezing.   Cardiovascular: Negative.   Gastrointestinal: Negative for abdominal pain and nausea.  Musculoskeletal: Negative for neck pain.  Neurological: Negative for headaches.   Past Medical History:  Diagnosis Date  . Anxiety   . Arthritis   . Diabetes mellitus without complication ([place])   . Hyperlipidemia   . Hypertension   . PTSD (post-traumatic stress disorder)     Social History   Socioeconomic History  . Marital status: Divorced    Spouse name: Not on file  . Number of children: Not on file  . Years of education: Not on file  . Highest education level: Not on file  Occupational History  . Not on file  Social Needs  . Financial resource strain: Not on file  . Food insecurity:    Worry: Not on file    Inability: Not on file  . Transportation needs:    Medical: Not on file    Non-medical: Not on file  Tobacco Use  . Smoking status: Never Smoker  . Smokeless tobacco: Never Used  Substance and Sexual Activity  . Alcohol use: No  . Drug use: No  . Sexual activity: Not on file  Lifestyle  .  Physical activity:    Days per week: Not on file    Minutes per session: Not on file  . Stress: Not on file  Relationships  . Social connections:    Talks on phone: Not on file    Gets together: Not on file    Attends religious service: Not on file    Active member of club or organization: Not on file    Attends meetings of clubs or organizations: Not on file    Relationship status: Not on file  . Intimate partner violence:    Fear of current or ex partner: Not on file    Emotionally abused: Not on file    Physically abused: Not on file    Forced sexual activity: Not on file  Other Topics Concern  . Not on file  Social History Narrative  . Not on file    Past Surgical History:  Procedure Laterality Date  . ACHILLES TENDON SURGERY Left   . BACK SURGERY    . BLADDER SUSPENSION    . CHOLECYSTECTOMY    . NOSE SURGERY     x 2   . OTHER SURGICAL HISTORY     Breast Duct removed  . PARTIAL HYSTERECTOMY    . RECTOCELE REPAIR    . VEIN SURGERY Right    leg    Family History  Problem Relation Age of Onset  .  Diabetes Mother   . Alzheimer's disease Mother   . Lung cancer Father   . Esophageal cancer Brother   . Colon cancer Neg Hx     Allergies  Allergen Reactions  . Dilaudid [Hydromorphone] Nausea And Vomiting and Other (See Comments)    Made the patient feel "spaced out," also  . Morphine And Related Itching  . Penicillins Rash    Has patient had a PCN reaction causing immediate rash, facial/tongue/throat swelling, SOB or lightheadedness with hypotension: Yes Has patient had a PCN reaction causing severe rash involving mucus membranes or skin necrosis: No Has patient had a PCN reaction that required hospitalization: No Has patient had a PCN reaction occurring within the last 10 years: No If all of the above answers are "NO", then may proceed with Cephalosporin use.     Current Outpatient Medications on File Prior to Visit  Medication Sig Dispense Refill  .  ACCU-CHEK SOFTCLIX LANCETS lancets Use to test blood sugar 3 times daily. Dx: E11.8 100 each 12  . Alcohol Swabs (B-D SINGLE USE SWABS REGULAR) PADS Use to test blood sugar 3 times daily 100 each 3  . Biotin 10000 MCG TABS Take 10,000 mcg by mouth 2 (two) times a week.    . Blood Glucose Calibration (ACCU-CHEK AVIVA) SOLN Use as directed 1 each 2  . Blood Glucose Monitoring Suppl (ACCU-CHEK AVIVA PLUS) w/Device KIT Use to check blood sugar 3 times daily 1 kit 1  . clonazePAM (KLONOPIN) 1 MG tablet TAKE ONE TABLET BY MOUTH TWICE A DAY AS NEEDED FOR ANXIETY (MAY REFILL 1/13 AND 2/13) 60 tablet 3  . fluticasone (FLONASE) 50 MCG/ACT nasal spray Place 2 sprays into both nostrils daily. 16 g 2  . gabapentin (NEURONTIN) 400 MG capsule Take 2 capsules (800 mg total) by mouth at bedtime. 60 capsule 3  . glucose blood (ACCU-CHEK AVIVA) test strip Use to test blood sugar 3 times daily. Dx: 11.8 100 each 12  . hydrochlorothiazide (HYDRODIURIL) 25 MG tablet Take 1 tablet (25 mg total) by mouth daily. 90 tablet 3  . HYDROcodone-acetaminophen (NORCO/VICODIN) 5-325 MG tablet Take 1 tablet by mouth every 6 (six) hours as needed for moderate pain. 90 tablet 0  . metFORMIN (GLUCOPHAGE) 500 MG tablet Take 1 tablet (500 mg total) by mouth 2 (two) times daily with a meal. 180 tablet 1  . methocarbamol (ROBAXIN) 500 MG tablet Take 1 tablet (500 mg total) by mouth every 6 (six) hours as needed for muscle spasms. 120 tablet 2  . multivitamin-lutein (OCUVITE-LUTEIN) CAPS capsule Take 1 capsule by mouth daily.    Marland Kitchen omeprazole (PRILOSEC) 40 MG capsule Take 1 capsule (40 mg total) by mouth daily. 90 capsule 3  . rosuvastatin (CRESTOR) 10 MG tablet Take 1 tablet (10 mg total) by mouth daily. 90 tablet 2   No current facility-administered medications on file prior to visit.     BP 104/74   Temp 98 F (36.7 C) (Oral)   Wt 192 lb (87.1 kg)   BMI 29.19 kg/m       Objective:   Physical Exam  Constitutional: She is  oriented to person, place, and time. She appears well-developed and well-nourished. No distress.  HENT:  Head: Normocephalic and atraumatic.  Right Ear: External ear normal.  Left Ear: External ear normal.  Nose: Rhinorrhea (clear) present. No mucosal edema. Right sinus exhibits no maxillary sinus tenderness and no frontal sinus tenderness. Left sinus exhibits no maxillary sinus tenderness and no frontal sinus  tenderness.  Mouth/Throat: Uvula is midline, oropharynx is clear and moist and mucous membranes are normal. No oropharyngeal exudate.  Eyes: Pupils are equal, round, and reactive to light. Conjunctivae and EOM are normal.  Neck: Normal range of motion. Neck supple.  Cardiovascular: Normal rate, regular rhythm, normal heart sounds and intact distal pulses. Exam reveals no gallop and no friction rub.  No murmur heard. Pulmonary/Chest: Effort normal and breath sounds normal. No stridor. No respiratory distress. She has no wheezes. She has no rales. She exhibits no tenderness.  Neurological: She is alert and oriented to person, place, and time.  Skin: Skin is warm. She is not diaphoretic.  Psychiatric: She has a normal mood and affect. Her behavior is normal. Judgment and thought content normal.  Nursing note and vitals reviewed.     Assessment & Plan:  1. Viral sinusitis - Exam consistent with viral URI/sinsusitis.  - Advised rest and hydration  - Continue with Flonase  - Follow up if symptoms worsen over the next 2-3 days   Dorothyann Peng, NP

## 2017-09-14 ENCOUNTER — Ambulatory Visit: Payer: Medicare HMO | Admitting: Registered"

## 2017-09-18 ENCOUNTER — Telehealth: Payer: Self-pay | Admitting: Family Medicine

## 2017-09-18 NOTE — Telephone Encounter (Signed)
Copied from [place] [id number]. Topic: Quick Communication - See Telephone Encounter >> Sep 18, 2017  9:08 AM Percell Belt A wrote: CRM for notification. See Telephone encounter for: 09/18/17.  Pt called in and stated that she was seen on 6/6, she stated that she was to call back if she was not feeling any better.  She would like to know if there is antibiotic that could be call in?    Pharmacy - [facility] on [address]

## 2017-09-18 NOTE — Telephone Encounter (Signed)
Message sent to Dr. Jordan for review and approval. 

## 2017-09-20 ENCOUNTER — Encounter: Payer: Self-pay | Admitting: Family Medicine

## 2017-09-20 NOTE — Telephone Encounter (Signed)
I saw her last 08/19/17 and we did not discuss respiratory symptoms. Cough and congestion last a few weeks after URI.  Thanks, BJ

## 2017-09-21 NOTE — Telephone Encounter (Signed)
Pt notified of results/instructions via [place].

## 2017-09-22 ENCOUNTER — Ambulatory Visit (INDEPENDENT_AMBULATORY_CARE_PROVIDER_SITE_OTHER): Payer: Medicare HMO | Admitting: Family Medicine

## 2017-09-22 ENCOUNTER — Encounter: Payer: Self-pay | Admitting: Family Medicine

## 2017-09-22 VITALS — BP 124/80 | HR 101 | Temp 98.3°F | Resp 16 | Ht 68.0 in

## 2017-09-22 DIAGNOSIS — R05 Cough: Secondary | ICD-10-CM | POA: Diagnosis not present

## 2017-09-22 DIAGNOSIS — K219 Gastro-esophageal reflux disease without esophagitis: Secondary | ICD-10-CM

## 2017-09-22 DIAGNOSIS — J988 Other specified respiratory disorders: Secondary | ICD-10-CM

## 2017-09-22 DIAGNOSIS — R0989 Other specified symptoms and signs involving the circulatory and respiratory systems: Secondary | ICD-10-CM

## 2017-09-22 DIAGNOSIS — R053 Chronic cough: Secondary | ICD-10-CM

## 2017-09-22 DIAGNOSIS — J989 Respiratory disorder, unspecified: Secondary | ICD-10-CM

## 2017-09-22 MED ORDER — AZITHROMYCIN 250 MG PO TABS: ORAL_TABLET | ORAL | 0 refills | Status: AC

## 2017-09-22 MED ORDER — PREDNISONE 20 MG PO TABS: 40.0000 mg | ORAL_TABLET | Freq: Every day | ORAL | 0 refills | Status: AC

## 2017-09-22 MED ORDER — ALBUTEROL SULFATE HFA 108 (90 BASE) MCG/ACT IN AERS: 2.0000 | INHALATION_SPRAY | Freq: Four times a day (QID) | RESPIRATORY_TRACT | 1 refills | Status: DC | PRN

## 2017-09-22 NOTE — Progress Notes (Signed)
ACUTE VISIT  HPI:  Chief Complaint  Patient presents with  . Cough    sx not getting any better, getting worse  . Nasal Congestion  . Wheezing  . Headache    [name] is a 67 y.o.female here today complaining of a month of respiratory symptoms.  She is frustrated because she feels like getting worse.  She just started working and she is supposed to go back tomorrow, she is afraid of losing her job. She is reporting intermittent wheezing and shortness of breath that started 3-4 days ago. She has not identified exacerbating or alleviating factors.  Cough causes sore throat.  No history of asthma or tobacco use. According to patient, a couple days ago somebody called for Methylprednisolone, which she was afraid of using but she was feeling so sick that started 2 days ago.  She has not noted any benefit. No fever or chills.   Cough  This is a new problem. The current episode started 1 to 4 weeks ago. The problem has been gradually worsening. The cough is non-productive. Associated symptoms include ear pain (Occasional right ear), headaches, nasal congestion, postnasal drip, rhinorrhea, a sore throat, shortness of breath and wheezing. Pertinent negatives include no eye redness, fever, heartburn, hemoptysis, myalgias or rash. Nothing aggravates the symptoms. She has tried oral steroids for the symptoms. The treatment provided no relief. There is no history of asthma, COPD or environmental allergies.    No Hx of recent travel. Her fianc has been sick with similar symptoms but not as bad as hers.  In 06/2017 she was treated for strep pharyngitis.   DM II, BS's 140's.  Hx of GERD,she is on Omeprazole 40 mg daily. No heartburn,nausea,or vomiting.   Review of Systems  Constitutional: Positive for activity change, appetite change and fatigue. Negative for fever.  HENT: Positive for congestion, ear pain (Occasional right ear), postnasal drip, rhinorrhea, sore  throat and voice change. Negative for mouth sores, nosebleeds, sinus pressure and trouble swallowing.   Eyes: Negative for discharge and redness.  Respiratory: Positive for cough, shortness of breath and wheezing. Negative for hemoptysis.   Gastrointestinal: Negative for abdominal pain, diarrhea, heartburn, nausea and vomiting.  Musculoskeletal: Negative for gait problem and myalgias.  Skin: Negative for rash.  Allergic/Immunologic: Negative for environmental allergies.  Neurological: Positive for headaches. Negative for syncope and weakness.  Hematological: Negative for adenopathy. Does not bruise/bleed easily.  Psychiatric/Behavioral: Positive for sleep disturbance. Negative for confusion. The patient is nervous/anxious.       Current Outpatient Medications on File Prior to Visit  Medication Sig Dispense Refill  . ACCU-CHEK SOFTCLIX LANCETS lancets Use to test blood sugar 3 times daily. Dx: E11.8 100 each 12  . Alcohol Swabs (B-D SINGLE USE SWABS REGULAR) PADS Use to test blood sugar 3 times daily 100 each 3  . Biotin 10000 MCG TABS Take 10,000 mcg by mouth 2 (two) times a week.    . Blood Glucose Calibration (ACCU-CHEK AVIVA) SOLN Use as directed 1 each 2  . Blood Glucose Monitoring Suppl (ACCU-CHEK AVIVA PLUS) w/Device KIT Use to check blood sugar 3 times daily 1 kit 1  . clonazePAM (KLONOPIN) 1 MG tablet TAKE ONE TABLET BY MOUTH TWICE A DAY AS NEEDED FOR ANXIETY (MAY REFILL 1/13 AND 2/13) 60 tablet 3  . fluticasone (FLONASE) 50 MCG/ACT nasal spray Place 2 sprays into both nostrils daily. 16 g 2  . gabapentin (NEURONTIN) 400 MG capsule Take 2 capsules (  800 mg total) by mouth at bedtime. 60 capsule 3  . glucose blood (ACCU-CHEK AVIVA) test strip Use to test blood sugar 3 times daily. Dx: 11.8 100 each 12  . hydrochlorothiazide (HYDRODIURIL) 25 MG tablet Take 1 tablet (25 mg total) by mouth daily. 90 tablet 3  . HYDROcodone-acetaminophen (NORCO/VICODIN) 5-325 MG tablet Take 1 tablet by  mouth every 6 (six) hours as needed for moderate pain. 90 tablet 0  . metFORMIN (GLUCOPHAGE) 500 MG tablet Take 1 tablet (500 mg total) by mouth 2 (two) times daily with a meal. 180 tablet 1  . methocarbamol (ROBAXIN) 500 MG tablet Take 1 tablet (500 mg total) by mouth every 6 (six) hours as needed for muscle spasms. 120 tablet 2  . multivitamin-lutein (OCUVITE-LUTEIN) CAPS capsule Take 1 capsule by mouth daily.    Marland Kitchen omeprazole (PRILOSEC) 40 MG capsule Take 1 capsule (40 mg total) by mouth daily. 90 capsule 3  . rosuvastatin (CRESTOR) 10 MG tablet Take 1 tablet (10 mg total) by mouth daily. 90 tablet 2   No current facility-administered medications on file prior to visit.      Past Medical History:  Diagnosis Date  . Anxiety   . Arthritis   . Diabetes mellitus without complication ([place])   . Hyperlipidemia   . Hypertension   . PTSD (post-traumatic stress disorder)    Allergies  Allergen Reactions  . Dilaudid [Hydromorphone] Nausea And Vomiting and Other (See Comments)    Made the patient feel "spaced out," also  . Morphine And Related Itching  . Penicillins Rash    Has patient had a PCN reaction causing immediate rash, facial/tongue/throat swelling, SOB or lightheadedness with hypotension: Yes Has patient had a PCN reaction causing severe rash involving mucus membranes or skin necrosis: No Has patient had a PCN reaction that required hospitalization: No Has patient had a PCN reaction occurring within the last 10 years: No If all of the above answers are "NO", then may proceed with Cephalosporin use.     Social History   Socioeconomic History  . Marital status: Divorced    Spouse name: Not on file  . Number of children: Not on file  . Years of education: Not on file  . Highest education level: Not on file  Occupational History  . Not on file  Social Needs  . Financial resource strain: Not on file  . Food insecurity:    Worry: Not on file    Inability: Not on file  .  Transportation needs:    Medical: Not on file    Non-medical: Not on file  Tobacco Use  . Smoking status: Never Smoker  . Smokeless tobacco: Never Used  Substance and Sexual Activity  . Alcohol use: No  . Drug use: No  . Sexual activity: Not on file  Lifestyle  . Physical activity:    Days per week: Not on file    Minutes per session: Not on file  . Stress: Not on file  Relationships  . Social connections:    Talks on phone: Not on file    Gets together: Not on file    Attends religious service: Not on file    Active member of club or organization: Not on file    Attends meetings of clubs or organizations: Not on file    Relationship status: Not on file  Other Topics Concern  . Not on file  Social History Narrative  . Not on file    Vitals:  09/22/17 1546  BP: 124/80  Pulse: (!) 101  Resp: 16  Temp: 98.3 F (36.8 C)  SpO2: 97%   Body mass index is 29.19 kg/m.    Physical Exam  Nursing note and vitals reviewed. Constitutional: She is oriented to person, place, and time. She appears well-developed. She does not appear ill. No distress.  HENT:  Head: Normocephalic and atraumatic.  Right Ear: Tympanic membrane, external ear and ear canal normal.  Left Ear: Tympanic membrane, external ear and ear canal normal.  Nose: Rhinorrhea present. Right sinus exhibits no maxillary sinus tenderness and no frontal sinus tenderness. Left sinus exhibits no maxillary sinus tenderness and no frontal sinus tenderness.  Mouth/Throat: Oropharynx is clear and moist and mucous membranes are normal.  Postnasal drainage.  Eyes: Conjunctivae are normal.  Cardiovascular: Regular rhythm. Tachycardia present.  No murmur heard. Respiratory: Effort normal and breath sounds normal. No stridor. No respiratory distress.  Coughing during OV. Minimal course sounds in left base after neb treatment. Prolonged expiration.  Lymphadenopathy:       Head (right side): No submandibular adenopathy  present.       Head (left side): No submandibular adenopathy present.    She has no cervical adenopathy.  Neurological: She is alert and oriented to person, place, and time. She has normal strength. Gait normal.  Skin: Skin is warm. No rash noted. No erythema.  Psychiatric: Her mood appears anxious. Her affect is labile.  Well groomed, good eye contact.      ASSESSMENT AND PLAN:  Ms. Coralie was seen today for cough, nasal congestion, wheezing and headache.  Diagnoses and all orders for this visit:  Reactive airway disease that is not asthma  No wheezing today . Albuterol inh 2 puff every 6 hours for a week then as needed for wheezing or shortness of breath.  After discussion on some side effects of Prednisone,she agrees with short course. Stop Methylprednisolone. Monitor BS's closely.  -     DG Chest 2 View; Future -     albuterol (PROVENTIL HFA;VENTOLIN HFA) 108 (90 Base) MCG/ACT inhaler; Inhale 2 puffs into the lungs every 6 (six) hours as needed for wheezing or shortness of breath. -     predniSONE (DELTASONE) 20 MG tablet; Take 2 tablets (40 mg total) by mouth daily with breakfast for 5 days.  Persistent cough  Improved with Duoneb neb treatment. Possible causes discussed. GERD could be contributing to problem. CXR ordered.  -     DG Chest 2 View; Future  Respiratory tract infection  Because symptoms present for a month and getting worse,abs treatment was recommended. Explained that if this is not bacterial,abx will not help. Some side effects discussed. Plain mucinex may also help. F/U ion 2 weeks if symptoms have not resolved,before if they get worse.   -     azithromycin (ZITHROMAX) 250 MG tablet; 2 tabs day one then 1 tab daily for 4 days.  Gastroesophageal reflux disease, esophagitis presence not specified  For now no changes in Omeprazole. Continue GERD precautions.       Solomiya Pascale G. [place], MD  [facility]. [place]  office.

## 2017-09-22 NOTE — Patient Instructions (Addendum)
A few things to remember from today's visit:   Reactive airway disease that is not asthma - Plan: DG Chest 2 View, albuterol (PROVENTIL HFA;VENTOLIN HFA) 108 (90 Base) MCG/ACT inhaler, predniSONE (DELTASONE) 20 MG tablet  Gastroesophageal reflux disease, esophagitis presence not specified  Persistent cough - Plan: DG Chest 2 View  Albuterol inh 2 puff every 6 hours for a week then as needed for wheezing or shortness of breath.  Stop methylprednisone and start prednisone daily with breakfast. Plenty of fluids, plain Mucinex, and nasal irrigations with saline may also help.  Today X ray was ordered.  This can be done at [facility] between 8 am and 5 pm: [place]. [phone].   Please be sure medication list is accurate. If a new problem present, please set up appointment sooner than planned today.

## 2017-09-23 ENCOUNTER — Ambulatory Visit (INDEPENDENT_AMBULATORY_CARE_PROVIDER_SITE_OTHER)
Admission: RE | Admit: 2017-09-23 | Discharge: 2017-09-23 | Disposition: A | Discharge status: Home / Self Care | Payer: Medicare HMO | Source: Ambulatory Visit | Attending: Family Medicine | Admitting: Family Medicine

## 2017-09-23 DIAGNOSIS — R0602 Shortness of breath: Secondary | ICD-10-CM | POA: Diagnosis not present

## 2017-09-23 DIAGNOSIS — J989 Respiratory disorder, unspecified: Secondary | ICD-10-CM

## 2017-09-23 DIAGNOSIS — R05 Cough: Secondary | ICD-10-CM | POA: Diagnosis not present

## 2017-09-23 DIAGNOSIS — R0989 Other specified symptoms and signs involving the circulatory and respiratory systems: Secondary | ICD-10-CM

## 2017-09-23 DIAGNOSIS — R053 Chronic cough: Secondary | ICD-10-CM

## 2017-09-26 ENCOUNTER — Encounter: Payer: Self-pay | Admitting: Family Medicine

## 2017-10-06 ENCOUNTER — Encounter: Payer: Self-pay | Admitting: Family Medicine

## 2017-10-06 ENCOUNTER — Other Ambulatory Visit: Payer: Self-pay | Admitting: Family Medicine

## 2017-10-06 DIAGNOSIS — R202 Paresthesia of skin: Principal | ICD-10-CM

## 2017-10-06 DIAGNOSIS — R200 Anesthesia of skin: Secondary | ICD-10-CM

## 2017-10-07 ENCOUNTER — Encounter: Payer: Medicare HMO | Attending: Physical Medicine & Rehabilitation | Admitting: Registered Nurse

## 2017-10-07 ENCOUNTER — Encounter: Payer: Self-pay | Admitting: Registered Nurse

## 2017-10-07 ENCOUNTER — Other Ambulatory Visit: Payer: Self-pay

## 2017-10-07 VITALS — BP 123/82 | HR 79 | Ht 68.0 in | Wt 191.4 lb

## 2017-10-07 DIAGNOSIS — E785 Hyperlipidemia, unspecified: Secondary | ICD-10-CM | POA: Diagnosis not present

## 2017-10-07 DIAGNOSIS — I10 Essential (primary) hypertension: Secondary | ICD-10-CM | POA: Insufficient documentation

## 2017-10-07 DIAGNOSIS — M7918 Myalgia, other site: Secondary | ICD-10-CM

## 2017-10-07 DIAGNOSIS — M47812 Spondylosis without myelopathy or radiculopathy, cervical region: Secondary | ICD-10-CM

## 2017-10-07 DIAGNOSIS — F4310 Post-traumatic stress disorder, unspecified: Secondary | ICD-10-CM | POA: Insufficient documentation

## 2017-10-07 DIAGNOSIS — Z79891 Long term (current) use of opiate analgesic: Secondary | ICD-10-CM | POA: Diagnosis not present

## 2017-10-07 DIAGNOSIS — M542 Cervicalgia: Secondary | ICD-10-CM | POA: Diagnosis not present

## 2017-10-07 DIAGNOSIS — M48061 Spinal stenosis, lumbar region without neurogenic claudication: Secondary | ICD-10-CM | POA: Diagnosis not present

## 2017-10-07 DIAGNOSIS — M25512 Pain in left shoulder: Secondary | ICD-10-CM | POA: Diagnosis not present

## 2017-10-07 DIAGNOSIS — M19012 Primary osteoarthritis, left shoulder: Secondary | ICD-10-CM | POA: Diagnosis not present

## 2017-10-07 DIAGNOSIS — F329 Major depressive disorder, single episode, unspecified: Secondary | ICD-10-CM | POA: Diagnosis not present

## 2017-10-07 DIAGNOSIS — G8929 Other chronic pain: Secondary | ICD-10-CM | POA: Insufficient documentation

## 2017-10-07 DIAGNOSIS — M75102 Unspecified rotator cuff tear or rupture of left shoulder, not specified as traumatic: Secondary | ICD-10-CM

## 2017-10-07 DIAGNOSIS — M545 Low back pain: Secondary | ICD-10-CM | POA: Insufficient documentation

## 2017-10-07 DIAGNOSIS — Z5181 Encounter for therapeutic drug level monitoring: Secondary | ICD-10-CM | POA: Diagnosis not present

## 2017-10-07 DIAGNOSIS — M461 Sacroiliitis, not elsewhere classified: Secondary | ICD-10-CM

## 2017-10-07 DIAGNOSIS — M5030 Other cervical disc degeneration, unspecified cervical region: Secondary | ICD-10-CM | POA: Insufficient documentation

## 2017-10-07 DIAGNOSIS — M5412 Radiculopathy, cervical region: Secondary | ICD-10-CM

## 2017-10-07 DIAGNOSIS — R102 Pelvic and perineal pain: Secondary | ICD-10-CM | POA: Diagnosis not present

## 2017-10-07 DIAGNOSIS — G2581 Restless legs syndrome: Secondary | ICD-10-CM | POA: Insufficient documentation

## 2017-10-07 DIAGNOSIS — E119 Type 2 diabetes mellitus without complications: Secondary | ICD-10-CM | POA: Insufficient documentation

## 2017-10-07 DIAGNOSIS — T1490XA Injury, unspecified, initial encounter: Secondary | ICD-10-CM | POA: Insufficient documentation

## 2017-10-07 DIAGNOSIS — G894 Chronic pain syndrome: Secondary | ICD-10-CM | POA: Diagnosis not present

## 2017-10-07 MED ORDER — HYDROCODONE-ACETAMINOPHEN 5-325 MG PO TABS: 1.0000 | ORAL_TABLET | Freq: Four times a day (QID) | ORAL | 0 refills | Status: DC | PRN
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
## 2017-10-07 NOTE — Patient Instructions (Signed)
Call Zella Ball Third Week of July [phone]

## 2017-10-12 ENCOUNTER — Telehealth: Payer: Self-pay

## 2017-10-12 NOTE — Telephone Encounter (Signed)
Pt called stating she is getting a SI injection and she has concerns.

## 2017-10-13 ENCOUNTER — Telehealth: Payer: Self-pay

## 2017-10-13 NOTE — Telephone Encounter (Signed)
I spoke with [name] Pulse and gave her a detailed description of our injection process and provided reassurance that Dr Letta Pate has been doing these injections for many years and will be able to discuss further prior to injection her exact pain location and if the SI is appropriate.Her pain does not radiate to her leg but is in the upper right area of her lower back. She is much more comfortable with the process now. She has clonazepam but I told her I could ask Dr Letta Pate what would be best as far as pre medication if she feels she will be anxious.  Please advise.

## 2017-10-13 NOTE — Telephone Encounter (Signed)
Ok to use her klonopin

## 2017-10-13 NOTE — Telephone Encounter (Signed)
error 

## 2017-10-13 NOTE — Telephone Encounter (Signed)
Notified. 

## 2017-10-15 ENCOUNTER — Other Ambulatory Visit: Payer: Self-pay

## 2017-10-15 ENCOUNTER — Encounter: Payer: Self-pay | Admitting: Physical Medicine & Rehabilitation

## 2017-10-15 ENCOUNTER — Ambulatory Visit: Payer: Medicare HMO | Admitting: Physical Medicine & Rehabilitation

## 2017-10-15 VITALS — BP 138/78 | HR 81 | Ht 67.0 in | Wt 191.8 lb

## 2017-10-15 DIAGNOSIS — M461 Sacroiliitis, not elsewhere classified: Secondary | ICD-10-CM

## 2017-10-15 DIAGNOSIS — M542 Cervicalgia: Secondary | ICD-10-CM | POA: Diagnosis not present

## 2017-10-15 DIAGNOSIS — F4310 Post-traumatic stress disorder, unspecified: Secondary | ICD-10-CM | POA: Diagnosis not present

## 2017-10-15 DIAGNOSIS — T1490XA Injury, unspecified, initial encounter: Secondary | ICD-10-CM | POA: Diagnosis not present

## 2017-10-15 DIAGNOSIS — M5030 Other cervical disc degeneration, unspecified cervical region: Secondary | ICD-10-CM | POA: Diagnosis not present

## 2017-10-15 DIAGNOSIS — M545 Low back pain: Secondary | ICD-10-CM | POA: Diagnosis not present

## 2017-10-15 DIAGNOSIS — G2581 Restless legs syndrome: Secondary | ICD-10-CM | POA: Diagnosis not present

## 2017-10-15 DIAGNOSIS — M25512 Pain in left shoulder: Secondary | ICD-10-CM | POA: Diagnosis not present

## 2017-10-15 DIAGNOSIS — G8929 Other chronic pain: Secondary | ICD-10-CM | POA: Diagnosis not present

## 2017-10-15 DIAGNOSIS — R102 Pelvic and perineal pain: Secondary | ICD-10-CM | POA: Diagnosis not present

## 2017-10-15 NOTE — Progress Notes (Signed)
  PROCEDURE RECORD [facility]   Name: [name] DOB:[birth year] MRN: [id number]  Date:10/15/2017  Physician: Alysia Penna, MD    Nurse/CMA:Bright CMA  Allergies:  Allergies  Allergen Reactions  . Dilaudid [Hydromorphone] Nausea And Vomiting and Other (See Comments)    Made the patient feel "spaced out," also  . Morphine And Related Itching  . Penicillins Rash    Has patient had a PCN reaction causing immediate rash, facial/tongue/throat swelling, SOB or lightheadedness with hypotension: Yes Has patient had a PCN reaction causing severe rash involving mucus membranes or skin necrosis: No Has patient had a PCN reaction that required hospitalization: No Has patient had a PCN reaction occurring within the last 10 years: No If all of the above answers are "NO", then may proceed with Cephalosporin use.     Consent Signed: No.  Is patient diabetic? Yes.    CBG today? Did not check, typically runs 130-140 educated about steroid effect on BS  Pregnant: No. LMP: No LMP recorded. Patient has had a hysterectomy. (age 13-55)  Anticoagulants: no Anti-inflammatory: no Antibiotics: no   Took her clonazepam prior to injection  Procedure: right sacroiliac steroid injection Position: Prone   Start Time: 10:49am  End Time: 1052am Fluoro Time: 11s  RN/CMA Shumaker RN Bright CMA    Time 10:12am 1100am    BP 138/78 142/87    Pulse 81 73    Respirations 14 16    O2 Sat 96 97    S/S 6 6    Pain Level 9/10 0/10     patient A & O X 3, D/C instructions reviewed, and sits independently. No driver required.

## 2017-10-15 NOTE — Patient Instructions (Signed)
Sacroiliac injection was performed today. A combination of a naming medicine plus a cortisone medicine was injected. The injection was done under x-ray guidance. This procedure has been performed to help reduce low back and buttocks pain as well as potentially hip pain. The duration of this injection is variable lasting from hours to  Months. It may repeated if needed. 

## 2017-10-15 NOTE — Progress Notes (Deleted)
  PROCEDURE RECORD [facility]   Name: [name] DOB:June 04, 1950 MRN: [id number]  Date:10/15/2017  Physician: Alysia Penna, MD    Nurse/CMA: Bright CMA  Allergies:  Allergies  Allergen Reactions  . Dilaudid [Hydromorphone] Nausea And Vomiting and Other (See Comments)    Made the patient feel "spaced out," also  . Morphine And Related Itching  . Penicillins Rash    Has patient had a PCN reaction causing immediate rash, facial/tongue/throat swelling, SOB or lightheadedness with hypotension: Yes Has patient had a PCN reaction causing severe rash involving mucus membranes or skin necrosis: No Has patient had a PCN reaction that required hospitalization: No Has patient had a PCN reaction occurring within the last 10 years: No If all of the above answers are "NO", then may proceed with Cephalosporin use.     Consent Signed: Yes.    Is patient diabetic? Yes.    CBG today? 135  Pregnant: No. LMP: No LMP recorded. Patient has had a hysterectomy. (age 75-55)  Anticoagulants: no Anti-inflammatory: no Antibiotics: no  Procedure: Right Sacroiliac Inj  Position: Prone   Start Time: ***  End Time: ***  Fluoro Time: ***  RN/CMA *** Bright CMA    Time *** ***    BP *** ***    Pulse *** ***    Respirations *** ***    O2 Sat *** ***    S/S *** ***    Pain Level *** ***     D/C home with Self, patient A & O X 3, D/C instructions reviewed, and sits independently.

## 2017-10-15 NOTE — Progress Notes (Signed)

## 2017-10-16 ENCOUNTER — Encounter: Payer: Self-pay | Admitting: Family Medicine

## 2017-10-16 ENCOUNTER — Ambulatory Visit (INDEPENDENT_AMBULATORY_CARE_PROVIDER_SITE_OTHER): Payer: Medicare HMO | Admitting: Family Medicine

## 2017-10-16 VITALS — BP 124/82 | HR 82 | Temp 97.7°F | Resp 12 | Ht 67.0 in | Wt 190.1 lb

## 2017-10-16 DIAGNOSIS — F419 Anxiety disorder, unspecified: Secondary | ICD-10-CM

## 2017-10-16 DIAGNOSIS — R0981 Nasal congestion: Secondary | ICD-10-CM | POA: Diagnosis not present

## 2017-10-16 DIAGNOSIS — G4700 Insomnia, unspecified: Secondary | ICD-10-CM | POA: Diagnosis not present

## 2017-10-16 DIAGNOSIS — E1149 Type 2 diabetes mellitus with other diabetic neurological complication: Secondary | ICD-10-CM | POA: Diagnosis not present

## 2017-10-16 DIAGNOSIS — F329 Major depressive disorder, single episode, unspecified: Secondary | ICD-10-CM

## 2017-10-16 DIAGNOSIS — F32A Depression, unspecified: Secondary | ICD-10-CM

## 2017-10-16 DIAGNOSIS — F3341 Major depressive disorder, recurrent, in partial remission: Secondary | ICD-10-CM | POA: Insufficient documentation

## 2017-10-16 MED ORDER — CLONAZEPAM 1 MG PO TABS: ORAL_TABLET | ORAL | 3 refills | Status: DC

## 2017-10-16 MED ORDER — DULAGLUTIDE 0.75 MG/0.5ML ~~LOC~~ SOAJ: 0.7500 mg | SUBCUTANEOUS | 0 refills | Status: DC

## 2017-10-16 MED ORDER — SERTRALINE HCL 25 MG PO TABS: 25.0000 mg | ORAL_TABLET | Freq: Every day | ORAL | 1 refills | Status: DC

## 2017-10-16 MED ORDER — IPRATROPIUM BROMIDE 0.06 % NA SOLN: 2.0000 | Freq: Four times a day (QID) | NASAL | 3 refills | Status: DC

## 2017-10-16 MED ORDER — HYDROXYZINE HCL 25 MG PO TABS: 25.0000 mg | ORAL_TABLET | Freq: Three times a day (TID) | ORAL | 1 refills | Status: DC | PRN

## 2017-10-16 NOTE — Progress Notes (Signed)
HPI:   [name] is a 67 y.o. female, who is here today for 5 months follow up.   She was last seen on 09/22/17 for acute illness.  She is feeling greatly better, cough has resolved but is still having nasal congestion and postnasal drainage. She is currently on Flonase nasal spray, nasal irrigations with saline.  DM II: She discontinued Metformin 3 weeks ago because it was causing LE cramps. BS's 140's. Denies abdominal pain, nausea,vomiting, polydipsia,polyuria, or polyphagia.  Last eye exam 6 months ago.   Lab Results  Component Value Date   HGBA1C 7.4 (H) 08/19/2017   She is drinking more water,decreased sugar intake,reading labels     Anxiety and depression: Crying spells for no particular reason. She is under stress due to fianc's illness and mother now in the nursing home.  Having frequent panic attacks, clonazepam does not seem to help, she would have to take 2 to 3 tablets at the same time in order to relieve anxiety episode. Last time she took clonazepam was yesterday. In the past she has tried Celexa, Lexapro, and Cymbalta. She would like to try Vistaril, which she remembers trying and helped.   Denies suicidal thoughts.  Chronic pain, she is reporting improving of right lower back pain after SI injection she had recently. Pending EMG and neuro evaluation for toes numbness. Hx of peripheral neuropathy.   Insomnia:  She is currently on liquid melatonin and she takes OTC NyQuil, she still wakes up several times through the night. In the past she was on Ambien and trazodone, both discontinued because of side effects. Trazodone caused next day drowsiness.    Review of Systems  Constitutional: Positive for fatigue. Negative for activity change, appetite change and fever.  HENT: Negative for mouth sores, nosebleeds and trouble swallowing.   Eyes: Negative for redness and visual disturbance.  Respiratory: Negative for cough, shortness of  breath and wheezing.   Cardiovascular: Negative for chest pain, palpitations and leg swelling.  Gastrointestinal: Negative for abdominal pain, nausea and vomiting.       Negative for changes in bowel habits.  Endocrine: Negative for polydipsia, polyphagia and polyuria.  Genitourinary: Negative for decreased urine volume, dysuria and hematuria.  Musculoskeletal: Positive for back pain. Negative for gait problem.  Skin: Negative for rash and wound.  Allergic/Immunologic: Positive for environmental allergies.  Neurological: Negative for seizures, syncope, weakness, numbness and headaches.  Psychiatric/Behavioral: Positive for sleep disturbance. Negative for confusion and suicidal ideas. The patient is nervous/anxious.      Current Outpatient Medications on File Prior to Visit  Medication Sig Dispense Refill  . ACCU-CHEK SOFTCLIX LANCETS lancets Use to test blood sugar 3 times daily. Dx: E11.8 100 each 12  . albuterol (PROVENTIL HFA;VENTOLIN HFA) 108 (90 Base) MCG/ACT inhaler Inhale 2 puffs into the lungs every 6 (six) hours as needed for wheezing or shortness of breath. 1 Inhaler 1  . Alcohol Swabs (B-D SINGLE USE SWABS REGULAR) PADS Use to test blood sugar 3 times daily 100 each 3  . Biotin 10000 MCG TABS Take 10,000 mcg by mouth 2 (two) times a week.    . Blood Glucose Calibration (ACCU-CHEK AVIVA) SOLN Use as directed 1 each 2  . Blood Glucose Monitoring Suppl (ACCU-CHEK AVIVA PLUS) w/Device KIT Use to check blood sugar 3 times daily 1 kit 1  . fluticasone (FLONASE) 50 MCG/ACT nasal spray Place 2 sprays into both nostrils daily. 16 g 2  . gabapentin (NEURONTIN) 400  MG capsule Take 2 capsules (800 mg total) by mouth at bedtime. 60 capsule 3  . glucose blood (ACCU-CHEK AVIVA) test strip Use to test blood sugar 3 times daily. Dx: 11.8 100 each 12  . hydrochlorothiazide (HYDRODIURIL) 25 MG tablet Take 1 tablet (25 mg total) by mouth daily. 90 tablet 3  . HYDROcodone-acetaminophen  (NORCO/VICODIN) 5-325 MG tablet Take 1 tablet by mouth every 6 (six) hours as needed for moderate pain. 90 tablet 0  . methocarbamol (ROBAXIN) 500 MG tablet Take 1 tablet (500 mg total) by mouth every 6 (six) hours as needed for muscle spasms. 120 tablet 2  . multivitamin-lutein (OCUVITE-LUTEIN) CAPS capsule Take 1 capsule by mouth daily.    Marland Kitchen omeprazole (PRILOSEC) 40 MG capsule Take 1 capsule (40 mg total) by mouth daily. 90 capsule 3  . rosuvastatin (CRESTOR) 10 MG tablet Take 1 tablet (10 mg total) by mouth daily. 90 tablet 2   No current facility-administered medications on file prior to visit.      Past Medical History:  Diagnosis Date  . Anxiety   . Arthritis   . Diabetes mellitus without complication ([place])   . Hyperlipidemia   . Hypertension   . PTSD (post-traumatic stress disorder)    Allergies  Allergen Reactions  . Dilaudid [Hydromorphone] Nausea And Vomiting and Other (See Comments)    Made the patient feel "spaced out," also  . Morphine And Related Itching  . Penicillins Rash    Has patient had a PCN reaction causing immediate rash, facial/tongue/throat swelling, SOB or lightheadedness with hypotension: Yes Has patient had a PCN reaction causing severe rash involving mucus membranes or skin necrosis: No Has patient had a PCN reaction that required hospitalization: No Has patient had a PCN reaction occurring within the last 10 years: No If all of the above answers are "NO", then may proceed with Cephalosporin use.     Social History   Socioeconomic History  . Marital status: Divorced    Spouse name: Not on file  . Number of children: Not on file  . Years of education: Not on file  . Highest education level: Not on file  Occupational History  . Not on file  Social Needs  . Financial resource strain: Not on file  . Food insecurity:    Worry: Not on file    Inability: Not on file  . Transportation needs:    Medical: Not on file    Non-medical: Not on file    Tobacco Use  . Smoking status: Never Smoker  . Smokeless tobacco: Never Used  Substance and Sexual Activity  . Alcohol use: No  . Drug use: No  . Sexual activity: Not on file  Lifestyle  . Physical activity:    Days per week: Not on file    Minutes per session: Not on file  . Stress: Not on file  Relationships  . Social connections:    Talks on phone: Not on file    Gets together: Not on file    Attends religious service: Not on file    Active member of club or organization: Not on file    Attends meetings of clubs or organizations: Not on file    Relationship status: Not on file  Other Topics Concern  . Not on file  Social History Narrative  . Not on file    Vitals:   10/16/17 1404  BP: 124/82  Pulse: 82  Resp: 12  Temp: 97.7 F (36.5 C)  SpO2:  98%   Body mass index is 29.77 kg/m.    Physical Exam  Nursing note and vitals reviewed. Constitutional: She is oriented to person, place, and time. She appears well-developed. No distress.  HENT:  Head: Normocephalic and atraumatic.  Mouth/Throat: Oropharynx is clear and moist and mucous membranes are normal.  Eyes: Pupils are equal, round, and reactive to light. Conjunctivae are normal.  Cardiovascular: Normal rate and regular rhythm.  No murmur heard. Pulses:      Dorsalis pedis pulses are 2+ on the right side, and 2+ on the left side.  Respiratory: Effort normal and breath sounds normal. No respiratory distress.  GI: Soft. She exhibits no mass. There is no hepatomegaly. There is no tenderness.  Musculoskeletal: She exhibits no edema.  Lymphadenopathy:    She has no cervical adenopathy.  Neurological: She is alert and oriented to person, place, and time. She has normal strength. Gait normal.  Skin: Skin is warm. No rash noted. No erythema.  Psychiatric: Her mood appears anxious. Her affect is labile.  Well groomed, good eye contact.    ASSESSMENT AND PLAN:   [name] was seen today for 5  months follow-up.  No orders of the defined types were placed in this encounter.   1. Type 2 diabetes mellitus with neurological complications ([place])  After discussion of some treatment options,she agrees with trying Trulicity. She will start wioth Trulicity 9.52 mg and increase to 1.5 in 4 weeks. Continue healthy beating habits. Foot care. Eye exam is current. F/U in 3-4 months.  - Dulaglutide (TRULICITY) 8.41 LK/4.4WN SOPN; Inject 0.75 mg into the skin once a week.  Dispense: 4 pen; Refill: 0   2. Anxiety and depression  She will start weaning off Clonazepam. Hydroxyzine 25 mg added as requested for anxiety. After discussion of some side effects she agrees with trying low dose Zoloft. F/U in 4-6 weeks,before if needed.  - clonazePAM (KLONOPIN) 1 MG tablet; Every other day for 10 days then every 3rd day x 7 days then every 4th days for 7 days.  Dispense: 60 tablet; Refill: 3 - hydrOXYzine (ATARAX/VISTARIL) 25 MG tablet; Take 1 tablet (25 mg total) by mouth 3 (three) times daily as needed for anxiety.  Dispense: 90 tablet; Refill: 1 - sertraline (ZOLOFT) 25 MG tablet; Take 1 tablet (25 mg total) by mouth daily.  Dispense: 30 tablet; Refill: 1  3. Insomnia, unspecified type  Good sleep hygiene for now. Hydroxyzine and Zoloft may help. Will consider adding Doxepin next visit. F/U in 4-6 weeks.   4. Nasal congestion  Continue saline nasal irrigations and Flonase nasal spray. Atrovent nasal spray added. F/U as needed.  - ipratropium (ATROVENT) 0.06 % nasal spray; Place 2 sprays into both nostrils 4 (four) times daily.  Dispense: 15 mL; Refill: 3      Traveion Ruddock G. Martinique, MD  [facility]. [place] office.

## 2017-10-16 NOTE — Patient Instructions (Addendum)
A few things to remember from today's visit:   Type 2 diabetes mellitus with neurological complications ([place])  Anxiety and depression - Plan: clonazePAM (KLONOPIN) 1 MG tablet  Insomnia, unspecified type  Today we started Sertraline, this type of medications can increase suicidal risk. This is more prevalent among children,adolecents, and young adults with major depression or other psychiatric disorders. It can also make depression worse. Most common side effects are gastrointestinal, self limited after a few weeks: diarrhea, nausea, constipation  Or diarrhea among some.  In general it is well tolerated. We will follow closely.       Please be sure medication list is accurate. If a new problem present, please set up appointment sooner than planned today.

## 2017-10-18 ENCOUNTER — Other Ambulatory Visit: Payer: Self-pay | Admitting: Family Medicine

## 2017-10-20 ENCOUNTER — Encounter (HOSPITAL_COMMUNITY): Payer: Self-pay | Admitting: *Deleted

## 2017-10-20 ENCOUNTER — Emergency Department (HOSPITAL_COMMUNITY)
Admission: EM | Admit: 2017-10-20 | Discharge: 2017-10-20 | Disposition: A | Discharge status: Home / Self Care | Payer: Medicare HMO | Attending: Emergency Medicine | Admitting: Emergency Medicine

## 2017-10-20 ENCOUNTER — Encounter: Payer: Self-pay | Admitting: Family Medicine

## 2017-10-20 DIAGNOSIS — Z5321 Procedure and treatment not carried out due to patient leaving prior to being seen by health care provider: Secondary | ICD-10-CM | POA: Diagnosis not present

## 2017-10-20 DIAGNOSIS — M79671 Pain in right foot: Secondary | ICD-10-CM | POA: Insufficient documentation

## 2017-10-20 NOTE — ED Triage Notes (Signed)
Pt w/ hx of diabetes complains of right foot pain, swelling and numbness for the past week, worse since this morning. Pt is to have scan for nerve damage in foot but was recommended to come to ED since pain is worsening.
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
## 2017-10-20 NOTE — ED Notes (Signed)
Pt stated that she was going to leave. Pt stated that the people in the lobby are stressing her out and she does not need to be here with all of the additional stress. EMT first tried to get Pt to stay. Pt asked EMT first to take her blood pressure cuff off. Pt was seen walking out of the door.

## 2017-10-23 ENCOUNTER — Emergency Department (HOSPITAL_COMMUNITY)
Admission: EM | Admit: 2017-10-23 | Discharge: 2017-10-23 | Disposition: A | Discharge status: Home / Self Care | Payer: Medicare HMO | Attending: Emergency Medicine | Admitting: Emergency Medicine

## 2017-10-23 ENCOUNTER — Encounter (HOSPITAL_COMMUNITY): Payer: Self-pay | Admitting: Emergency Medicine

## 2017-10-23 ENCOUNTER — Emergency Department (HOSPITAL_COMMUNITY): Payer: Medicare HMO

## 2017-10-23 DIAGNOSIS — Z79899 Other long term (current) drug therapy: Secondary | ICD-10-CM | POA: Diagnosis not present

## 2017-10-23 DIAGNOSIS — Z7984 Long term (current) use of oral hypoglycemic drugs: Secondary | ICD-10-CM | POA: Insufficient documentation

## 2017-10-23 DIAGNOSIS — I10 Essential (primary) hypertension: Secondary | ICD-10-CM | POA: Insufficient documentation

## 2017-10-23 DIAGNOSIS — E119 Type 2 diabetes mellitus without complications: Secondary | ICD-10-CM | POA: Insufficient documentation

## 2017-10-23 DIAGNOSIS — M79671 Pain in right foot: Secondary | ICD-10-CM | POA: Diagnosis not present

## 2017-10-23 DIAGNOSIS — F329 Major depressive disorder, single episode, unspecified: Secondary | ICD-10-CM | POA: Diagnosis not present

## 2017-10-23 DIAGNOSIS — F419 Anxiety disorder, unspecified: Secondary | ICD-10-CM | POA: Insufficient documentation

## 2017-10-23 DIAGNOSIS — Z9049 Acquired absence of other specified parts of digestive tract: Secondary | ICD-10-CM | POA: Insufficient documentation

## 2017-10-23 DIAGNOSIS — M7989 Other specified soft tissue disorders: Secondary | ICD-10-CM | POA: Diagnosis not present

## 2017-10-23 DIAGNOSIS — S99921A Unspecified injury of right foot, initial encounter: Secondary | ICD-10-CM | POA: Diagnosis not present

## 2017-10-23 NOTE — ED Notes (Signed)
This RN spoke with patient regarding if she would be comfortable going to a hallway bed to be seen sooner by a provider since all ED rooms are full, pt became very upset stating that she thought it was "ridiculous" that I offered her a hallway bed and that all of our rooms were full when she called up here and was told the ED was empty and she should come on now to be seen. This RN explained to patient that we had patients left from last night and earlier this am causing our rooms to be full, pt made aware she was [place] to wait for a regular room if she did not feel comfortable in the hallway. Pt stated this RN was "provoking" her and she didn't appreciate being treated like she was less important by being placed in a hallway bed. This RN also offered for the charge nurse to speak with patient regarding situation but patient refused.

## 2017-10-23 NOTE — ED Notes (Signed)
Pt fitted with post op shoe per ortho, states understanding of d/c instructions aND THAT SHE NEEDS TO MAKE HER OWN APP  With dr Marlou Sa, pt refused w/c

## 2017-10-23 NOTE — Progress Notes (Signed)
Orthopedic Tech Progress Note Patient Details:  [name] [facility] March 02, 1951 [id number]  Ortho Devices Type of Ortho Device: Postop shoe/boot Ortho Device/Splint Interventions: Application   Post Interventions Patient Tolerated: Well Instructions Provided: Care of device   [name] 10/23/2017, 11:11 AM

## 2017-10-23 NOTE — ED Provider Notes (Signed)
[facility] EMERGENCY DEPARTMENT Provider Note   CSN: [id number] Arrival date & time: 10/23/17  7681     History   Chief Complaint Chief Complaint  Patient presents with  . Foot Pain    HPI [name] is a 67 y.o. female medical problems, who comes in, anemia, arthritis who presents emergency department today for right foot pain.  Patient reports she has been dealing with right foot pain over the last 2 weeks after taking a misstep down her stairs.  She notes the pain is at the base of her fifth metatarsal and runs to the heel of her foot.  She notes that the pain is worse with ambulation.  She reports she does have some numbness to the sole of her foot as well as all digits on both feet and is seeing a neurologist on August 30 for this to have a nerve conduction study.  Her primary care doctor thinks this is due to her diabetes.  She notes that these started before her injury.  Patient reports she has been taking hydrocodone provided by her pain management doctor for this with relief of her symptoms.  Patient reports pain level is 8/10, constant and throbbing.  She denies any fever, chills, upper leg pain, immobilization, weakness, overlying erythema or joint swelling.  HPI  Past Medical History:  Diagnosis Date  . Anxiety   . Arthritis   . Diabetes mellitus without complication ([place])   . Hyperlipidemia   . Hypertension   . PTSD (post-traumatic stress disorder)     Patient Active Problem List   Diagnosis Date Noted  . Anxiety and depression 10/16/2017  . Insomnia 10/16/2017  . Chronic pain syndrome 09/07/2017  . Spinal stenosis of lumbar region 07/08/2017  . DOE (dyspnea on exertion) 12/16/2016  . Family history of early CAD 12/16/2016  . Other chest pain 12/16/2016  . Arthritis of left acromioclavicular joint 08/13/2016  . Left shoulder pain 05/12/2016  . Cervical spondylosis without myelopathy 05/12/2016  . Rotator cuff syndrome, left 05/12/2016   . Restless leg syndrome 05/12/2016  . Primary osteoarthritis, left hand 05/12/2016  . MELENA 10/18/2008  . GASTROPARESIS 05/30/2008  . Type 2 diabetes mellitus with neurological complications ([place]) 15/72/6203  . HYPERCHOLESTEROLEMIA 05/04/2008  . HYPOKALEMIA 05/04/2008  . ANXIETY DEPRESSION 05/04/2008  . GERD 05/04/2008  . Constipation 05/04/2008  . HEMATEMESIS 05/04/2008  . Essential hypertension 05/04/2008    Past Surgical History:  Procedure Laterality Date  . ACHILLES TENDON SURGERY Left   . BACK SURGERY    . BLADDER SUSPENSION    . CHOLECYSTECTOMY    . NOSE SURGERY     x 2   . OTHER SURGICAL HISTORY     Breast Duct removed  . PARTIAL HYSTERECTOMY    . RECTOCELE REPAIR    . VEIN SURGERY Right    leg     OB History   None      Home Medications    Prior to Admission medications   Medication Sig Start Date End Date Taking? Authorizing Provider  ACCU-CHEK SOFTCLIX LANCETS lancets Use to test blood sugar 3 times daily. Dx: E11.8 08/11/17   Martinique, Betty G, MD  albuterol (PROVENTIL HFA;VENTOLIN HFA) 108 (90 Base) MCG/ACT inhaler Inhale 2 puffs into the lungs every 6 (six) hours as needed for wheezing or shortness of breath. 09/22/17 07/19/18  Martinique, Betty G, MD  Alcohol Swabs (B-D SINGLE USE SWABS REGULAR) PADS Use to test blood sugar 3 times daily  08/11/17   Martinique, Betty G, MD  Biotin 10000 MCG TABS Take 10,000 mcg by mouth 2 (two) times a week.    [provider]  Blood Glucose Calibration (ACCU-CHEK AVIVA) SOLN Use as directed 08/11/17   Martinique, Betty G, MD  Blood Glucose Monitoring Suppl (ACCU-CHEK AVIVA PLUS) w/Device KIT Use to check blood sugar 3 times daily 08/11/17   Martinique, Betty G, MD  clonazePAM (KLONOPIN) 1 MG tablet Every other day for 10 days then every 3rd day x 7 days then every 4th days for 7 days. 10/16/17   Martinique, Betty G, MD  Dulaglutide (TRULICITY) 7.67 HA/1.9FX SOPN Inject 0.75 mg into the skin once a week. 10/16/17   Martinique, Betty G, MD    fluticasone [facility]) 50 MCG/ACT nasal spray Place 2 sprays into both nostrils daily. 07/01/17   Martinique, Betty G, MD  gabapentin (NEURONTIN) 400 MG capsule Take 2 capsules (800 mg total) by mouth at bedtime. 09/08/17   Martinique, Betty G, MD  glucose blood (ACCU-CHEK AVIVA) test strip Use to test blood sugar 3 times daily. Dx: 11.8 08/11/17   Martinique, Betty G, MD  hydrochlorothiazide (HYDRODIURIL) 25 MG tablet TAKE 1 TABLET BY MOUTH EVERY DAY 10/19/17   Martinique, Betty G, MD  HYDROcodone-acetaminophen (NORCO/VICODIN) 5-325 MG tablet Take 1 tablet by mouth every 6 (six) hours as needed for moderate pain. 10/07/17   Bayard Hugger, NP  hydrOXYzine (ATARAX/VISTARIL) 25 MG tablet Take 1 tablet (25 mg total) by mouth 3 (three) times daily as needed for anxiety. 10/16/17   Martinique, Betty G, MD  ipratropium (ATROVENT) 0.06 % nasal spray Place 2 sprays into both nostrils 4 (four) times daily. 10/16/17   Martinique, Betty G, MD  methocarbamol (ROBAXIN) 500 MG tablet Take 1 tablet (500 mg total) by mouth every 6 (six) hours as needed for muscle spasms. 08/03/17   Meredith Staggers, MD  multivitamin-lutein [facility]) CAPS capsule Take 1 capsule by mouth daily.    [provider]  omeprazole (PRILOSEC) 40 MG capsule Take 1 capsule (40 mg total) by mouth daily. 08/12/17   Martinique, Betty G, MD  rosuvastatin (CRESTOR) 10 MG tablet Take 1 tablet (10 mg total) by mouth daily. 08/11/17   Martinique, Betty G, MD  sertraline (ZOLOFT) 25 MG tablet Take 1 tablet (25 mg total) by mouth daily. 10/16/17   Martinique, Betty G, MD    Family History Family History  Problem Relation Age of Onset  . Diabetes Mother   . Alzheimer's disease Mother   . Lung cancer Father   . Esophageal cancer Brother   . Colon cancer Neg Hx     Social History Social History   Tobacco Use  . Smoking status: Never Smoker  . Smokeless tobacco: Never Used  Substance Use Topics  . Alcohol use: No  . Drug use: No     Allergies   Dilaudid  [hydromorphone]; Morphine and related; and Penicillins   Review of Systems Review of Systems  All other systems reviewed and are negative.    Physical Exam Updated Vital Signs BP 128/77 (BP Location: Right Arm)   Pulse 83   Temp 97.8 F (36.6 C) (Oral)   Resp 16   SpO2 99%   Physical Exam  Constitutional: She appears well-developed and well-nourished.  HENT:  Head: Normocephalic and atraumatic.  Right Ear: External ear normal.  Left Ear: External ear normal.  Eyes: Conjunctivae are normal. Right eye exhibits no discharge. Left eye exhibits no discharge. No scleral  icterus.  Cardiovascular:  No lower extremity swelling or edema.  Calves are symmetric in size bilaterally.  Negative Homans test. Doppler pulses are intact for dorsalis pedis and posterior tib pulse on the right.  Pulmonary/Chest: Effort normal. No respiratory distress.  Musculoskeletal:       Right ankle: Normal. She exhibits normal range of motion and no swelling. No tenderness. Achilles tendon normal.       Feet:  There is no overlying joint erythema, heat or swelling.  Patient has normal range of motion of right ankle as well as all digits.  There is no open wounds.  No ulcerations including between webspaces.  There is no edema of the lower extremity.  Neurological: She is alert. She has normal strength. No sensory deficit. Gait normal.  Sensation intact to light touch for the right foot in all nerve distributions.  Skin: Skin is warm and dry. Capillary refill takes less than 2 seconds. No erythema. No pallor.  No breaks in skin  Psychiatric: She has a normal mood and affect.  Nursing note and vitals reviewed.    ED Treatments / Results  Labs (all labs ordered are listed, but only abnormal results are displayed) Labs Reviewed - No data to display  EKG None  Radiology Dg Foot Complete Right  Result Date: 10/23/2017 CLINICAL DATA:  67 year old female status post injury when stepped wrong on pool  steps 2 weeks ago. Continued pain and swelling. Toe numbness. EXAM: RIGHT FOOT COMPLETE - 3+ VIEW COMPARISON:  Right foot series 06/07/2014. FINDINGS: Chronic 1st MTP joint degeneration has not significantly changed since 2016. The metatarsals and phalanges appear intact and normally aligned. Tarsal bones appear intact. There is chronic degenerative spurring of the calcaneus, and also the medial and lateral malleolus. Additional dystrophic soft tissue calcification about the plantar calcaneal spur is chronic but increased since 2016. Mild calcified peripheral vascular disease at the ankle. There does appear to be mild soft tissue swelling about the 5th MTP joint, with no regional osseous abnormality. IMPRESSION: No acute osseous abnormality identified in the right foot. Chronic degenerative changes at the 1st MTP and calcaneus. Electronically Signed   By: Genevie Ann M.D.   On: 10/23/2017 09:05    Procedures Procedures (including critical care time)  Medications Ordered in ED Medications - No data to display   Initial Impression / Assessment and Plan / ED Course  I have reviewed the triage vital signs and the nursing notes.  Pertinent labs & imaging results that were available during my care of the patient were reviewed by me and considered in my medical decision making (see chart for details).     67 y.o. female presenting with right foot pain over the last 2 weeks after taking misstep.  Patient is neurovascular intact.  There is no evidence of open wounds.  No evidence of infection.  Patient is afebrile without joint swelling, overlying erythema has normal range of motion.  No concern for septic joint.  Patient has appropriate follow-up as an outpatient.  She is followed by Dr. Marlou Sa of orthopedics.  Will give referral to him.  Patient states she has a cam walker but reports it is too bulky to wear.  Will give postop shoe.   She is in pain management and states her pain medication controlling her  symptoms.  Patient also reports some numbness in bilateral toes over the last several months.  She is going to be seen by neurologist for nerve conduction study in August.  She has intact sensation of light touch with normal strength on my exam.  Advised her to keep this appointment.  No further work-up indicated at this time.  Patient given strict return precautions.  She is to follow with PCP in regards to today's visit as well as week.  She appears safe for discharge.  Final Clinical Impressions(s) / ED Diagnoses   Final diagnoses:  Foot pain, right    ED Discharge Orders    None       Lorelle Gibbs 10/23/17 1021    Little, Wenda Overland, MD 10/23/17 1348

## 2017-10-23 NOTE — ED Triage Notes (Signed)
Pt reports stepping wrong on the steps at the pool 2 weeks ago, c/o pain, numbness and swelling to R foot. pts pcp referred pt to ED for xrays. Went to [facility] on 7/16 but states the wait was so long she was never seen by a provider, no xrays were done. Pedal pulse intact

## 2017-10-23 NOTE — ED Notes (Signed)
Pt arrives to room 6 states she waited 6 hours at [facility] and never  got seen, pt states her foot hurt after stepping wrong in pool 2 weeks ago

## 2017-10-23 NOTE — Discharge Instructions (Signed)
Your x-ray did not show signs of fracture dislocation. I have given you a post op shoe Please continue your pain medication at home as needed and directed.  Keep your appointment for your nerve conduction study.  Follow up with your orthopedist this week for further evaluation.  If you develop worsening or new concerning symptoms you can return to the emergency department for re-evaluation.

## 2017-10-28 ENCOUNTER — Telehealth: Payer: Self-pay | Admitting: Registered Nurse

## 2017-10-28 DIAGNOSIS — G894 Chronic pain syndrome: Secondary | ICD-10-CM

## 2017-10-28 NOTE — Telephone Encounter (Signed)
New Message  Pt verbalized she was told to call to ask to speak to [place] and she will return patient's phone call.

## 2017-10-29 MED ORDER — HYDROCODONE-ACETAMINOPHEN 5-325 MG PO TABS: 1.0000 | ORAL_TABLET | Freq: Four times a day (QID) | ORAL | 0 refills | Status: DC | PRN

## 2017-10-29 NOTE — Telephone Encounter (Signed)
Contacted patient. She states that she was told to contact [place] regarding post-injection results and would like to discuss medication refill.....she is asking for Zella Ball to call her back.

## 2017-10-29 NOTE — Telephone Encounter (Signed)
Return Ms. Ruud call,  She had a ESI injection with some relief noted. Also reports increase intensity of lower back pain, she has placed a call to Dr. Saintclair Halsted awaiting a response.  Also states increase radicular pain radiating into her left lower extremity and feet, she is scheduled for Nerve Conduction Test. She's taking her gabapentin as follow one capsule in the morning and two capsules at bedtime. We will increase her gabapentin to: One capsule in the morning and afternoon and two capsules at HS.   She counted her Hydrocodone tablets she was given permission to increase her gabapentin to 4 times a day as needed for pain, No more than 4 tablets a day. She has 22 tablets at this time. We will send a new prescription to be filled on 07/29/ 2019, she verbalizes understanding.   PMP was reviewed last Hydrocodone prescription was filled on 10/07/2017 MME 19.57.

## 2017-11-09 ENCOUNTER — Encounter: Payer: Self-pay | Admitting: Registered Nurse

## 2017-11-09 ENCOUNTER — Encounter: Payer: Medicare HMO | Attending: Physical Medicine & Rehabilitation | Admitting: Registered Nurse

## 2017-11-09 VITALS — BP 133/86 | HR 85 | Resp 14 | Ht 68.0 in | Wt 196.0 lb

## 2017-11-09 DIAGNOSIS — M5030 Other cervical disc degeneration, unspecified cervical region: Secondary | ICD-10-CM | POA: Insufficient documentation

## 2017-11-09 DIAGNOSIS — G894 Chronic pain syndrome: Secondary | ICD-10-CM | POA: Diagnosis not present

## 2017-11-09 DIAGNOSIS — T1490XA Injury, unspecified, initial encounter: Secondary | ICD-10-CM | POA: Insufficient documentation

## 2017-11-09 DIAGNOSIS — F4310 Post-traumatic stress disorder, unspecified: Secondary | ICD-10-CM | POA: Insufficient documentation

## 2017-11-09 DIAGNOSIS — M48061 Spinal stenosis, lumbar region without neurogenic claudication: Secondary | ICD-10-CM | POA: Diagnosis not present

## 2017-11-09 DIAGNOSIS — I10 Essential (primary) hypertension: Secondary | ICD-10-CM | POA: Diagnosis not present

## 2017-11-09 DIAGNOSIS — M7918 Myalgia, other site: Secondary | ICD-10-CM | POA: Diagnosis not present

## 2017-11-09 DIAGNOSIS — M47812 Spondylosis without myelopathy or radiculopathy, cervical region: Secondary | ICD-10-CM | POA: Diagnosis not present

## 2017-11-09 DIAGNOSIS — M25512 Pain in left shoulder: Secondary | ICD-10-CM | POA: Diagnosis not present

## 2017-11-09 DIAGNOSIS — M5412 Radiculopathy, cervical region: Secondary | ICD-10-CM

## 2017-11-09 DIAGNOSIS — F329 Major depressive disorder, single episode, unspecified: Secondary | ICD-10-CM | POA: Diagnosis not present

## 2017-11-09 DIAGNOSIS — G2581 Restless legs syndrome: Secondary | ICD-10-CM | POA: Diagnosis not present

## 2017-11-09 DIAGNOSIS — Z79891 Long term (current) use of opiate analgesic: Secondary | ICD-10-CM

## 2017-11-09 DIAGNOSIS — E785 Hyperlipidemia, unspecified: Secondary | ICD-10-CM | POA: Diagnosis not present

## 2017-11-09 DIAGNOSIS — E119 Type 2 diabetes mellitus without complications: Secondary | ICD-10-CM | POA: Diagnosis not present

## 2017-11-09 DIAGNOSIS — M79671 Pain in right foot: Secondary | ICD-10-CM

## 2017-11-09 DIAGNOSIS — M542 Cervicalgia: Secondary | ICD-10-CM | POA: Insufficient documentation

## 2017-11-09 DIAGNOSIS — G8929 Other chronic pain: Secondary | ICD-10-CM | POA: Insufficient documentation

## 2017-11-09 DIAGNOSIS — R102 Pelvic and perineal pain: Secondary | ICD-10-CM | POA: Insufficient documentation

## 2017-11-09 DIAGNOSIS — M545 Low back pain: Secondary | ICD-10-CM | POA: Diagnosis not present

## 2017-11-09 DIAGNOSIS — Z5181 Encounter for therapeutic drug level monitoring: Secondary | ICD-10-CM | POA: Diagnosis not present

## 2017-11-09 MED ORDER — HYDROCODONE-ACETAMINOPHEN 5-325 MG PO TABS: 1.0000 | ORAL_TABLET | Freq: Four times a day (QID) | ORAL | 0 refills | Status: DC | PRN
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
## 2017-11-10 ENCOUNTER — Encounter: Payer: Self-pay | Admitting: Registered"

## 2017-11-10 ENCOUNTER — Encounter: Payer: Medicare HMO | Attending: Family Medicine | Admitting: Registered"

## 2017-11-10 DIAGNOSIS — E1149 Type 2 diabetes mellitus with other diabetic neurological complication: Secondary | ICD-10-CM | POA: Diagnosis not present

## 2017-11-10 DIAGNOSIS — Z713 Dietary counseling and surveillance: Secondary | ICD-10-CM | POA: Insufficient documentation

## 2017-11-10 NOTE — Progress Notes (Signed)
Diabetes Self-Management Education  Visit Type: First/Initial  Appt. Start Time: 1535 Appt. End Time: 1645  11/10/2017  [name], identified by name and date of birth, is a 67 y.o. female with a diagnosis of Diabetes: Type 2.   ASSESSMENT Pt states in the last 2 yrs she started eating more due to depression and has gained 50 lbs. Pt states she has issues with anxiety and uses food to cope. Pt describes nighttime eating as "binging." Pt expressed concern about controlling diabetes and worries when she eats that she is not taking care of herself. Pt states she was told that the Trulicity would reduce her appetite and help her lose weight, but patient stated that she is not overly concerned with the weight loss, but is afraid of having high blood sugar and with her new symptoms which may be neuropathy, now afraid of losing her feet. Pt states BG readings are high than expected for an A1c of 7.4% Patient confirms experiencing hypoglycemic symptoms, but has not checked bloods sugar at those times. RD suspects she may be having BG excursions and encouraged patient to check BG and treat if low.  Pt states she has not found help for her anxiety and has tried group therapy and 1:1 counselors but no one seems to understand the stress of having 2 of her children die within 2 years of each other, one due to murder. Pt states she doesn't feel heard by therapists. Pt states she cannot sleep without taking Nyquil, vistaril, and melatonin. Pt states she wakes up at 8 am but is groggy due to sleep aids and will often stay in bed a few hours. Pt states 4 years ago she was happy and enjoyed being around people, but now finds that she is usually irritated with people and always anxious and afraid to make decisions.   Pt states she had back surgery May 1, has steroid treatments and takes pain meds. Pt states the pain medications have caused constipation, but she manages with miralax.    At end of visit patient  was able to verbalize sample meals which she would enjoy that are examples of balanced, myplate meals.  Per [facility] EMR, patient does not have an eating disorder diagnosis. However, RD noted patient behaviors and stated beliefs consistent with ED and patient scored 20 on EAT-26. RD discussed with patient and provided contact information for local dietitians who specialize in this population. RD also shared information with patient's PCP for follow-up and/or referral if needed.   Diabetes Self-Management Education - 11/10/17 1548      Visit Information   Visit Type  First/Initial      Initial Visit   Diabetes Type  Type 2    Are you currently following a meal plan?  No    Are you taking your medications as prescribed?  Yes Trulicity    Date Diagnosed  20 years ago      Health Coping   How would you rate your overall health?  Fair      Psychosocial Assessment   Patient Belief/Attitude about Diabetes  Afraid afraid of losing feet    How often do you need to have someone help you when you read instructions, pamphlets, or other written materials from your doctor or pharmacy?  1 - Never    What is the last grade level you completed in school?  1 yr college      Complications   Last HgB A1C per patient/outside source  7.4 %    How often do you check your blood sugar?  1-2 times/day    Fasting Blood glucose range (mg/dL)  130-179    Have you had a dilated eye exam in the past 12 months?  Yes    Have you had a dental exam in the past 12 months?  No    Are you checking your feet?  Yes    How many days per week are you checking your feet?  7      Dietary Intake   Breakfast  none    Snack (morning)  none    Lunch  bologne sandwich, chips & salsa    Snack (afternoon)  none    Dinner  chicken sandwich, fries ([employer])    Snack (evening)  9 bite size candy bars and piece of pecan pie    Beverage(s)  a lot of coke zero, gold leaf diet tea at meal time, very little water, doesn't  like makes her gag      Exercise   Exercise Type  ADL's    How many days per week to you exercise?  0    How many minutes per day do you exercise?  0    Total minutes per week of exercise  0      Patient Education   Previous Diabetes Education  Yes (please comment) 2002    Nutrition management   Role of diet in the treatment of diabetes and the relationship between the three main macronutrients and blood glucose level;Food label reading, portion sizes and measuring food.    Medications  Reviewed patients medication for diabetes, action, purpose, timing of dose and side effects.    Monitoring  Purpose and frequency of SMBG.;Identified appropriate SMBG and/or A1C goals.    Acute complications  Taught treatment of hypoglycemia - the 15 rule.      Individualized Goals (developed by patient)   Nutrition  General guidelines for healthy choices and portions discussed    Medications  take my medication as prescribed    Monitoring   test my blood glucose as discussed    Reducing Risk  treat hypoglycemia with 15 grams of carbs if blood glucose less than 70mg /dL      Outcomes   Expected Outcomes  Demonstrated interest in learning. Expect positive outcomes    Future DMSE  PRN    Program Status  Completed     Individualized Plan for Diabetes Self-Management Training:   Learning Objective:  Patient will have a greater understanding of diabetes self-management. Patient education plan is to attend individual and/or group sessions per assessed needs and concerns.  Patient Instructions  Vitamin B12 may be helpful for nerve health. Consider having balanced meals and snack, including protein when eating carbohydrates. Check blood sugar fasting, with an ultimate goal of 80-130 mg/dL and 2 hours after a meal less than 180. When having symptoms of low blood sugar check and if 70 or lower treat with 15 grams of fast carbohydrate  To help with your relationship with food below are local dietitians who  specialize in disordered eating:  Kinsey, Iver Nestle, PhD, [place], [place], [place], [place] [address], [phone] Referral from PCP required  [facility] [address] [place], [place] [address], [email or web address], [employer] insurance, and they are able to take HSAs and [email or web address], [phone] [phone] Accepts [employer], and they are able to take HSAs and  FSAs   Expected Outcomes:  Demonstrated interest in learning. Expect positive outcomes  Education material provided: A1C conversion sheet, Meal plan card and Snack sheet  If problems or questions, patient to contact team via:  Phone and MyChart  Future DSME appointment: PRN

## 2017-11-10 NOTE — Patient Instructions (Addendum)
Vitamin B12 may be helpful for nerve health. Consider having balanced meals and snack, including protein when eating carbohydrates. Check blood sugar fasting, with an ultimate goal of 80-130 mg/dL and 2 hours after a meal less than 180. When having symptoms of low blood sugar check and if 70 or lower treat with 15 grams of fast carbohydrate  To help with your relationship with food below are local dietitians who specialize in disordered eating:  Experiment, Iver Nestle, PhD, [place], [place], [place], [place] [address], [phone] Referral from PCP required  [facility] [address] [place], [place] [address], [email or web address], [place] insurance, and they are able to take HSAs and [place] [email or web address], [phone] [phone] Accepts [employer], and they are able to take HSAs and FSAs

## 2017-11-12 LAB — DRUG TOX MONITOR 1 W/CONF, ORAL FLD
Alprazolam: NEGATIVE ng/mL (ref <0.50)
Amphetamines: NEGATIVE ng/mL (ref <10)
Barbiturates: NEGATIVE ng/mL (ref <10)
Benzodiazepines: POSITIVE ng/mL — AB (ref <0.50)
Buprenorphine: NEGATIVE ng/mL (ref <0.10)
Chlordiazepoxide: NEGATIVE ng/mL (ref <0.50)
Clonazepam: 0.96 ng/mL — ABNORMAL HIGH (ref <0.50)
Cocaine: NEGATIVE ng/mL (ref <5.0)
Codeine: NEGATIVE ng/mL (ref <2.5)
Diazepam: NEGATIVE ng/mL (ref <0.50)
Dihydrocodeine: 3 ng/mL — ABNORMAL HIGH (ref <2.5)
Fentanyl: NEGATIVE ng/mL (ref <0.10)
Flunitrazepam: NEGATIVE ng/mL (ref <0.50)
Flurazepam: NEGATIVE ng/mL (ref <0.50)
Heroin Metabolite: NEGATIVE ng/mL (ref <1.0)
Hydrocodone: 9.6 ng/mL — ABNORMAL HIGH (ref <2.5)
Hydromorphone: NEGATIVE ng/mL (ref <2.5)
Lorazepam: NEGATIVE ng/mL (ref <0.50)
MARIJUANA: NEGATIVE ng/mL (ref <2.5)
MDMA: NEGATIVE ng/mL (ref <10)
Meprobamate: NEGATIVE ng/mL (ref <2.5)
Methadone: NEGATIVE ng/mL (ref <5.0)
Midazolam: NEGATIVE ng/mL (ref <0.50)
Morphine: NEGATIVE ng/mL (ref <2.5)
Nicotine Metabolite: NEGATIVE ng/mL (ref <5.0)
Nordiazepam: NEGATIVE ng/mL (ref <0.50)
Norhydrocodone: NEGATIVE ng/mL (ref <2.5)
Noroxycodone: NEGATIVE ng/mL (ref <2.5)
Opiates: POSITIVE ng/mL — AB (ref <2.5)
Oxazepam: NEGATIVE ng/mL (ref <0.50)
Oxycodone: NEGATIVE ng/mL (ref <2.5)
Oxymorphone: NEGATIVE ng/mL (ref <2.5)
Phencyclidine: NEGATIVE ng/mL (ref <10)
Tapentadol: NEGATIVE ng/mL (ref <5.0)
Temazepam: NEGATIVE ng/mL (ref <0.50)
Tramadol: NEGATIVE ng/mL (ref <5.0)
Triazolam: NEGATIVE ng/mL (ref <0.50)
Zolpidem: NEGATIVE ng/mL (ref <5.0)

## 2017-11-12 LAB — DRUG TOX ALC METAB W/CON, ORAL FLD: Alcohol Metabolite: NEGATIVE ng/mL (ref <25)

## 2017-11-13 ENCOUNTER — Telehealth: Payer: Self-pay | Admitting: *Deleted

## 2017-11-13 ENCOUNTER — Ambulatory Visit: Payer: Medicare HMO | Admitting: Family Medicine

## 2017-11-13 NOTE — Telephone Encounter (Signed)
Oral swab drug screen was consistent for prescribed medications.  ?

## 2017-11-17 ENCOUNTER — Ambulatory Visit (INDEPENDENT_AMBULATORY_CARE_PROVIDER_SITE_OTHER): Payer: Medicare HMO | Admitting: Orthopaedic Surgery

## 2017-11-17 ENCOUNTER — Encounter (INDEPENDENT_AMBULATORY_CARE_PROVIDER_SITE_OTHER): Payer: Self-pay | Admitting: Orthopaedic Surgery

## 2017-11-17 DIAGNOSIS — M7671 Peroneal tendinitis, right leg: Secondary | ICD-10-CM

## 2017-11-17 MED ORDER — LIDOCAINE HCL 1 % IJ SOLN
1.0000 mL | INTRAMUSCULAR | Status: AC | PRN
  Administered 2017-11-17: 1 mL

## 2017-11-17 MED ORDER — METHYLPREDNISOLONE ACETATE 40 MG/ML IJ SUSP
40.0000 mg | INTRAMUSCULAR | Status: AC | PRN
  Administered 2017-11-17: 40 mg via INTRA_ARTICULAR

## 2017-11-17 MED ORDER — BUPIVACAINE HCL 0.5 % IJ SOLN
1.0000 mL | INTRAMUSCULAR | Status: AC | PRN
  Administered 2017-11-17: 1 mL via INTRA_ARTICULAR

## 2017-11-17 NOTE — Progress Notes (Signed)
Office Visit Note   Patient: [name]           Date of Birth: Dec 04, 1950           MRN: [id number] Visit Date: 11/17/2017              Requested by: Martinique, Betty G, MD [address] [place], [place] [address] PCP: Martinique, Betty G, MD   Assessment & Plan: Visit Diagnoses:  1. Peroneal tendinitis, right leg     Plan: Impression is right peroneal tendinitis.  Treatment options were discussed at length with the patient.  CAM Walker or at the very least a lace up ankle brace were recommended to the patient.  However, because she is in the process of moving she declined either of these options today as she did not want them to potential affect her ability to go up and down stairs.  Recommended wearing of orthotic sneakers rather than flip flops.  Patient did agree to a corticosteroid injection which was performed today.  Patient tolerated well.  We will have her return in 6 to 8 weeks.  We will see how she responds to the injection we performed today. Total face to face encounter time was greater than 45 minutes and over half of this time was spent in counseling and/or coordination of care.  Follow-Up Instructions: Return if symptoms worsen or fail to improve.   Orders:  No orders of the defined types were placed in this encounter.  No orders of the defined types were placed in this encounter.     Procedures:    Clinical Data: No additional findings.   Subjective: Chief Complaint  Patient presents with  . Right Foot - Pain    HPI  With lateral right foot pain 2 months.  She was seen in the emergency department on 10/23/2017 for the same pain.  X-rays were performed at that time she was discharged with a hard soled shoe.  She reports daily pain and swelling over the lateral aspect of the right foot as well as the dorsal part of the foot.  She denies any specific injury at the onset of pain.  Her pain is worse when she is up walking around a lot.  Does improve  with elevation.  She denies any erythema or increased warmth.  No bruising or overlying skin changes.  No fevers or chills.  No history of gout. Patient does have a history of surgery to the right leg that she says involving stripping.  Also, due to her diabetes she has nerve conduction studies scheduled later this month for suspected peripheral neuropathy.  Review of Systems See HPI  Objective: Vital Signs: There were no vitals taken for this visit.  Physical Exam GEN: Awake, alert, no acute distress.  Patient emotional when discussing her multiple medical issues Pulm: Breathing unlabored  Ortho Exam Right foot: Inspection:  No obvious bony deformity.  Mild swelling of the lateral foot and the base the fifth metatarsal.  No erythema, or bruising. Palpation: There is tenderness to palpation at the base the fifth metatarsal and slightly proximally along the distal most aspect of the peroneal tendons.  There is very minimal tenderness posterior to the lateral malleolus ROM: Full  ROM of the ankle.  Lateral foot pain with resisted eversion and dorsiflexion Strength: 5/5 strength ankle in all planes Neurovascular: N/V intact distally in the lower extremity Special tests: Negative anterior drawer.  Negative syndesmotic compression.  Negative talar tilt  Specialty  Comments:  MRI Cervical Spine: Multilevel degenerative changes most notably at C4-5. Left shoulder x-ray: Mild degenerative arthropathy of the glenohumeral & AC Joint.  Imaging: No results found.   PMFS History: Patient Active Problem List   Diagnosis Date Noted  . Anxiety and depression 10/16/2017  . Insomnia 10/16/2017  . Chronic pain syndrome 09/07/2017  . Spinal stenosis of lumbar region 07/08/2017  . DOE (dyspnea on exertion) 12/16/2016  . Family history of early CAD 12/16/2016  . Other chest pain 12/16/2016  . Arthritis of left acromioclavicular joint 08/13/2016  . Left shoulder pain 05/12/2016  . Cervical  spondylosis without myelopathy 05/12/2016  . Rotator cuff syndrome, left 05/12/2016  . Restless leg syndrome 05/12/2016  . Primary osteoarthritis, left hand 05/12/2016  . MELENA 10/18/2008  . GASTROPARESIS 05/30/2008  . Type 2 diabetes mellitus with neurological complications ([place]) 02/07/1593  . HYPERCHOLESTEROLEMIA 05/04/2008  . HYPOKALEMIA 05/04/2008  . ANXIETY DEPRESSION 05/04/2008  . GERD 05/04/2008  . Constipation 05/04/2008  . HEMATEMESIS 05/04/2008  . Essential hypertension 05/04/2008   Past Medical History:  Diagnosis Date  . Anxiety   . Arthritis   . Diabetes mellitus without complication ([place])   . Hyperlipidemia   . Hypertension   . PTSD (post-traumatic stress disorder)     Family History  Problem Relation Age of Onset  . Diabetes Mother   . Alzheimer's disease Mother   . Lung cancer Father   . Esophageal cancer Brother   . Colon cancer Neg Hx     Past Surgical History:  Procedure Laterality Date  . ACHILLES TENDON SURGERY Left   . BACK SURGERY    . BLADDER SUSPENSION    . CHOLECYSTECTOMY    . NOSE SURGERY     x 2   . OTHER SURGICAL HISTORY     Breast Duct removed  . PARTIAL HYSTERECTOMY    . RECTOCELE REPAIR    . VEIN SURGERY Right    leg   Social History   Occupational History  . Not on file  Tobacco Use  . Smoking status: Never Smoker  . Smokeless tobacco: Never Used  Substance and Sexual Activity  . Alcohol use: No  . Drug use: No  . Sexual activity: Not on file

## 2017-11-17 NOTE — Progress Notes (Signed)
   Procedure Note  Patient: [name]             Date of Birth: 24-Jun-1950           MRN: [id number]             Visit Date: 11/17/2017  Procedures: Visit Diagnoses: Peroneal tendinitis, right leg  Medium Joint Inj: R ankle on 11/17/2017 5:44 PM Indications: pain Details: 25 G needle Medications: 1 mL lidocaine 1 %; 40 mg methylPREDNISolone acetate 40 MG/ML; 1 mL bupivacaine 0.5 % Outcome: tolerated well, no immediate complications Patient was prepped and draped in the usual sterile fashion.

## 2017-11-18 ENCOUNTER — Encounter: Payer: Self-pay | Admitting: Family Medicine

## 2017-11-18 ENCOUNTER — Ambulatory Visit (INDEPENDENT_AMBULATORY_CARE_PROVIDER_SITE_OTHER): Payer: Medicare HMO | Admitting: Family Medicine

## 2017-11-18 VITALS — BP 126/82 | HR 87 | Temp 98.1°F | Resp 12 | Ht 68.0 in | Wt 196.2 lb

## 2017-11-18 DIAGNOSIS — T466X5A Adverse effect of antihyperlipidemic and antiarteriosclerotic drugs, initial encounter: Secondary | ICD-10-CM

## 2017-11-18 DIAGNOSIS — G2581 Restless legs syndrome: Secondary | ICD-10-CM

## 2017-11-18 DIAGNOSIS — E1149 Type 2 diabetes mellitus with other diabetic neurological complication: Secondary | ICD-10-CM | POA: Diagnosis not present

## 2017-11-18 DIAGNOSIS — M791 Myalgia, unspecified site: Secondary | ICD-10-CM | POA: Insufficient documentation

## 2017-11-18 DIAGNOSIS — F419 Anxiety disorder, unspecified: Secondary | ICD-10-CM

## 2017-11-18 DIAGNOSIS — F329 Major depressive disorder, single episode, unspecified: Secondary | ICD-10-CM | POA: Diagnosis not present

## 2017-11-18 DIAGNOSIS — F32A Depression, unspecified: Secondary | ICD-10-CM

## 2017-11-18 MED ORDER — CLONAZEPAM 1 MG PO TABS: 1.0000 mg | ORAL_TABLET | Freq: Every day | ORAL | 0 refills | Status: DC

## 2017-11-18 NOTE — Assessment & Plan Note (Deleted)
Problems and not quite well controlled but she feels like symptoms have improved, so she is not interested in trying an SSRI. She will continue clonazepam 1 mg daily. We did review some side effects of benzodiazepines. Hydroxyzine discontinued. Follow-up in 3 to 4 months, before if needed.

## 2017-11-18 NOTE — Assessment & Plan Note (Signed)
Problem is well controlled on gabapentin 800 mg at bedtime. Explained that I do not feel comfortable prescribing 3 months supply at the time, she is fine with 30-day supply. No changes in current management. Follow-up in 6 to 8 months.

## 2017-11-18 NOTE — Progress Notes (Signed)
HPI:  Chief Complaint  Patient presents with  . Follow-up    [name] is a 67 y.o. female, who is here today to follow on recent OV.   She was seen on 10/16/17. She was evaluated in the ER on 10/23/2017. Since her last visit she has followed with Dr. Erlinda Hong, orthopedist.  Last visit we plan on weaning of clonazepam and she was started on hydroxyzine 25 mg, which she requested to help with anxiety.  She did not tolerate hydroxyzine well, it makes her very drowsy.  She tried sublingual clonazepam tablet and she felt like it worked better than swallowing the pill.  So she would like to continue clonazepam 1 mg once daily.  She did not start sertraline 25 mg I recommended, states that she was afraid of having side effects. She feels like her anxiety and depression are better controlled at this point. She denies suicidal thoughts.  Sublingual melatonin has also help with insomnia.  She is feeling better because her fianc has been doing well with chemo and radiotherapy, he is still working full-time. He has 2 more chemo sections left then he will be reevaluated.  She is planning on starting a part-time job, she is excited about this.   DM 2: She started Trulicity, currently she is on 1.5 mg weekly. She has not had any side effects. FG 200s before she met with nutritionist, now she is having 130s 150s.  She is still afraid of thyroid issues with this medication. She thinks she has an uncle with thyroid cancer but states that "he did not die from that."  She is also inquiring about gabapentin, which she takes for RLS. She has to pay $8 for 30-day supplies and $0 for 32-monthsupplies, so she would like 34-monthupply if possible. She is currently on gabapentin 800 mg at bedtime. She is tolerating medication well and the helps with symptoms.  Lower extremity cramps improved after she started applying "therawork", she rubs it on her LE's and frequency of cramps  decreased, she has had about 2 in the past month. She was attributed cramps to metformin, now she is not sure. She also discontinued Crestor.  Negative for lower extremity edema or erythema.    Review of Systems  Constitutional: Positive for fatigue. Negative for activity change, appetite change and fever.  HENT: Positive for postnasal drip. Negative for sore throat.   Respiratory: Positive for cough (Has improved.). Negative for shortness of breath and wheezing.   Cardiovascular: Negative for chest pain, palpitations and leg swelling.  Gastrointestinal: Negative for abdominal pain, nausea and vomiting.       Negative for changes in bowel habits.  Endocrine: Negative for polydipsia, polyphagia and polyuria.  Genitourinary: Negative for decreased urine volume and hematuria.  Musculoskeletal: Positive for arthralgias. Negative for gait problem.  Skin: Negative for rash and wound.  Neurological: Positive for numbness (Toes/LE, stable,follows with neuro.). Negative for weakness and headaches.  Psychiatric/Behavioral: Negative for confusion and suicidal ideas. The patient is nervous/anxious.       Current Outpatient Medications on File Prior to Visit  Medication Sig Dispense Refill  . ACCU-CHEK SOFTCLIX LANCETS lancets Use to test blood sugar 3 times daily. Dx: E11.8 100 each 12  . albuterol (PROVENTIL HFA;VENTOLIN HFA) 108 (90 Base) MCG/ACT inhaler Inhale 2 puffs into the lungs every 6 (six) hours as needed for wheezing or shortness of breath. 1 Inhaler 1  . Alcohol Swabs (B-D SINGLE USE SWABS REGULAR)  PADS Use to test blood sugar 3 times daily 100 each 3  . Blood Glucose Calibration (ACCU-CHEK AVIVA) SOLN Use as directed 1 each 2  . Blood Glucose Monitoring Suppl (ACCU-CHEK AVIVA PLUS) w/Device KIT Use to check blood sugar 3 times daily 1 kit 1  . cyanocobalamin 100 MCG tablet Take 100 mcg by mouth daily.    . Dulaglutide (TRULICITY) 3.76 EG/3.1DV SOPN Inject 0.75 mg into the skin once  a week. 4 pen 0  . fluticasone (FLONASE) 50 MCG/ACT nasal spray Place 2 sprays into both nostrils daily. 16 g 2  . gabapentin (NEURONTIN) 400 MG capsule Take 2 capsules (800 mg total) by mouth at bedtime. 60 capsule 3  . glucose blood (ACCU-CHEK AVIVA) test strip Use to test blood sugar 3 times daily. Dx: 11.8 100 each 12  . hydrochlorothiazide (HYDRODIURIL) 25 MG tablet TAKE 1 TABLET BY MOUTH EVERY DAY 90 tablet 0  . HYDROcodone-acetaminophen (NORCO/VICODIN) 5-325 MG tablet Take 1 tablet by mouth every 6 (six) hours as needed for moderate pain. 100 tablet 0  . ipratropium (ATROVENT) 0.06 % nasal spray Place 2 sprays into both nostrils 4 (four) times daily. 15 mL 3  . methocarbamol (ROBAXIN) 500 MG tablet Take 1 tablet (500 mg total) by mouth every 6 (six) hours as needed for muscle spasms. 120 tablet 2  . multivitamin-lutein (OCUVITE-LUTEIN) CAPS capsule Take 1 capsule by mouth daily.    Marland Kitchen omeprazole (PRILOSEC) 40 MG capsule Take 1 capsule (40 mg total) by mouth daily. 90 capsule 3   No current facility-administered medications on file prior to visit.      Past Medical History:  Diagnosis Date  . Anxiety   . Arthritis   . Diabetes mellitus without complication ([place])   . Hyperlipidemia   . Hypertension   . PTSD (post-traumatic stress disorder)    Allergies  Allergen Reactions  . Dilaudid [Hydromorphone] Nausea And Vomiting and Other (See Comments)    Made the patient feel "spaced out," also  . Morphine And Related Itching  . Penicillins Rash    Has patient had a PCN reaction causing immediate rash, facial/tongue/throat swelling, SOB or lightheadedness with hypotension: Yes Has patient had a PCN reaction causing severe rash involving mucus membranes or skin necrosis: No Has patient had a PCN reaction that required hospitalization: No Has patient had a PCN reaction occurring within the last 10 years: No If all of the above answers are "NO", then may proceed with Cephalosporin use.       Social History   Socioeconomic History  . Marital status: Divorced    Spouse name: Not on file  . Number of children: Not on file  . Years of education: Not on file  . Highest education level: Not on file  Occupational History  . Not on file  Social Needs  . Financial resource strain: Not on file  . Food insecurity:    Worry: Not on file    Inability: Not on file  . Transportation needs:    Medical: Not on file    Non-medical: Not on file  Tobacco Use  . Smoking status: Never Smoker  . Smokeless tobacco: Never Used  Substance and Sexual Activity  . Alcohol use: No  . Drug use: No  . Sexual activity: Not on file  Lifestyle  . Physical activity:    Days per week: Not on file    Minutes per session: Not on file  . Stress: Not on file  Relationships  .  Social connections:    Talks on phone: Not on file    Gets together: Not on file    Attends religious service: Not on file    Active member of club or organization: Not on file    Attends meetings of clubs or organizations: Not on file    Relationship status: Not on file  Other Topics Concern  . Not on file  Social History Narrative  . Not on file    Vitals:   11/18/17 1512  BP: 126/82  Pulse: 87  Resp: 12  Temp: 98.1 F (36.7 C)  SpO2: 97%   Body mass index is 29.84 kg/m.   Physical Exam  Nursing note and vitals reviewed. Constitutional: She is oriented to person, place, and time. She appears well-developed. No distress.  HENT:  Head: Normocephalic and atraumatic.  Mouth/Throat: Oropharynx is clear and moist and mucous membranes are normal.  Eyes: Conjunctivae are normal.  Cardiovascular: Normal rate and regular rhythm.  No murmur heard. Pulses:      Dorsalis pedis pulses are 2+ on the right side, and 2+ on the left side.  Respiratory: Effort normal and breath sounds normal. No respiratory distress.  GI: Soft. She exhibits no mass. There is no tenderness.  Musculoskeletal: She exhibits edema  (Trace pitting edema LE, bilateral).  Lymphadenopathy:    She has no cervical adenopathy.  Neurological: She is alert and oriented to person, place, and time. She has normal strength. Gait normal.  Skin: Skin is warm. No rash noted. No erythema.  Psychiatric: Her mood appears anxious. Her affect is labile. Cognition and memory are normal. She expresses no suicidal ideation. She expresses no suicidal plans.  Well-groomed, good eye contact.    ASSESSMENT AND PLAN:  Ms. Kaiyana was seen today for follow-up.  Diagnoses and all orders for this visit:   Type 2 diabetes mellitus with neurological complications ([place]) She will continue Trulicity 1.5 mg weekly. We discussed some side effects. If A1c is not at goal we can consider adding back metformin. Continue following dietary recommendations. Follow-up in 3 to 4 months.  Anxiety and depression Problems and not quite well controlled but she feels like symptoms have improved, so she is not interested in trying an SSRI. She will continue clonazepam 1 mg daily. We did review some side effects of benzodiazepines. Hydroxyzine discontinued. Follow-up in 3 to 4 months, before if needed.  Restless leg syndrome Problem is well controlled on gabapentin 800 mg at bedtime. Explained that I do not feel comfortable prescribing 3 months supply at the time, she is fine with 30-day supply. No changes in current management. Follow-up in 6 to 8 months.  Myalgia due to statin Given her history of DM 2, she benefits from starting medication. We will try a different statin in the future. We will plan on checking lipid panel next visit.      Achille Xiang G. Martinique, MD  [facility]. [place] office.

## 2017-11-18 NOTE — Patient Instructions (Addendum)
A few things to remember from today's visit:   Type 2 diabetes mellitus with neurological complications ([place])  ANXIETY DEPRESSION  Restless leg syndrome  Anxiety and depression - Plan: clonazePAM (KLONOPIN) 1 MG tablet  Please be sure medication list is accurate. If a new problem present, please set up appointment sooner than planned today.

## 2017-11-18 NOTE — Assessment & Plan Note (Signed)
Problems and not quite well controlled but she feels like symptoms have improved, so she is not interested in trying an SSRI. She will continue clonazepam 1 mg daily. We did review some side effects of benzodiazepines. Hydroxyzine discontinued. Follow-up in 3 to 4 months, before if needed.

## 2017-11-18 NOTE — Assessment & Plan Note (Signed)
Given her history of DM 2, she benefits from starting medication. We will try a different statin in the future. We will plan on checking lipid panel next visit.

## 2017-11-18 NOTE — Assessment & Plan Note (Signed)
She will continue Trulicity 1.5 mg weekly. We discussed some side effects. If A1c is not at goal we can consider adding back metformin. Continue following dietary recommendations. Follow-up in 3 to 4 months.

## 2017-12-03 ENCOUNTER — Encounter

## 2017-12-03 ENCOUNTER — Ambulatory Visit: Payer: Medicare HMO | Admitting: Neurology

## 2017-12-03 ENCOUNTER — Encounter: Payer: Self-pay | Admitting: Neurology

## 2017-12-03 VITALS — BP 127/87 | HR 86 | Ht 68.0 in | Wt 190.0 lb

## 2017-12-03 DIAGNOSIS — R799 Abnormal finding of blood chemistry, unspecified: Secondary | ICD-10-CM | POA: Diagnosis not present

## 2017-12-03 DIAGNOSIS — R200 Anesthesia of skin: Secondary | ICD-10-CM

## 2017-12-03 DIAGNOSIS — E0842 Diabetes mellitus due to underlying condition with diabetic polyneuropathy: Secondary | ICD-10-CM

## 2017-12-03 NOTE — Progress Notes (Signed)
Subjective:    Patient ID: [name] is a 67 y.o. female.  HPI      [name], MD, PhD [facility] [address] [place], [place] [address]  Dear Dr. Martinique,   I saw your patient, [name], upon your kind request, in my neurologic clinic today for initial consultation of her numbness and tingling in her feet. The patient is unaccompanied today. As you know, Ms. well, is a 67 year old right-handed woman with an underlying medical history of lumbar spinal stenosis, status post previous lumbar spine surgery, overweight state, hypertension, hyperlipidemia, arthritis, anxiety, and diabetes, who reports an approximately five-month history of numbness and tingling affecting primarily her toes. She does not have burning sensation per se or pain but more like a pins and needles sensation and numbness. She does not feel it has extended to her feet or ankles or higher. She feels that her hands are okay. She had radiating pain from her back on the right side before her surgery but had back surgery under Dr. Saintclair Halsted, and her pain improved. She has been on gabapentin for restless leg syndrome for years. She also sees pain management and is on hydrocodone for pain affecting different areas including her left shoulder. She had lumbar spine MRI on 06/07/2017 and I reviewed the results: IMPRESSION: 1. Symptomatic level with regard to pain radiating to the right lower extremity suspected to be L2-L3 where a caudal disc extrusion into the right lateral recess is superimposed on mild multifactorial spinal stenosis. Query right L3 radiculitis. 2. Widespread lumbar spine degeneration. Severe multifactorial spinal stenosis at L4-L5. Moderate spinal stenosis and left greater than right lateral recess and foraminal stenosis at L3-L4. 3. Chronic postoperative changes on the left at L5-S1. I reviewed your office note from 11/18/2017, which you kindly included. Of  note, she is on gabapentin 800 mg at bedtime. She is on clonazepam as needed and takes narcotic pain medication as needed through pain management. Her most recent A1c that I could see in the electronic chart was 7.4 on 08/19/2017. She was started on Trulicity about a month ago and her blood sugar numbers have improved at home. She is divorced and lives alone. She is a nonsmoker and does not utilize alcohol and does not typically drink caffeine. She had 3 children, 2 passed away.   Her Past Medical History Is Significant For: Past Medical History:  Diagnosis Date  . Anxiety   . Arthritis   . Diabetes mellitus without complication ([place])   . Hyperlipidemia   . Hypertension   . PTSD (post-traumatic stress disorder)     Her Past Surgical History Is Significant For: Past Surgical History:  Procedure Laterality Date  . ACHILLES TENDON SURGERY Left   . BACK SURGERY    . BLADDER SUSPENSION    . CHOLECYSTECTOMY    . NOSE SURGERY     x 2   . OTHER SURGICAL HISTORY     Breast Duct removed  . PARTIAL HYSTERECTOMY    . RECTOCELE REPAIR    . VEIN SURGERY Right    leg    Her Family History Is Significant For: Family History  Problem Relation Age of Onset  . Diabetes Mother   . Alzheimer's disease Mother   . Lung cancer Father   . Esophageal cancer Brother   . Colon cancer Neg Hx     Her Social History Is Significant For: Social History   Socioeconomic History  . Marital  status: Divorced    Spouse name: Not on file  . Number of children: Not on file  . Years of education: Not on file  . Highest education level: Not on file  Occupational History  . Not on file  Social Needs  . Financial resource strain: Not on file  . Food insecurity:    Worry: Not on file    Inability: Not on file  . Transportation needs:    Medical: Not on file    Non-medical: Not on file  Tobacco Use  . Smoking status: Never Smoker  . Smokeless tobacco: Never Used  Substance and Sexual Activity  .  Alcohol use: No  . Drug use: No  . Sexual activity: Not on file  Lifestyle  . Physical activity:    Days per week: Not on file    Minutes per session: Not on file  . Stress: Not on file  Relationships  . Social connections:    Talks on phone: Not on file    Gets together: Not on file    Attends religious service: Not on file    Active member of club or organization: Not on file    Attends meetings of clubs or organizations: Not on file    Relationship status: Not on file  Other Topics Concern  . Not on file  Social History Narrative  . Not on file    Her Allergies Are:  Allergies  Allergen Reactions  . Dilaudid [Hydromorphone] Nausea And Vomiting and Other (See Comments)    Made the patient feel "spaced out," also  . Morphine And Related Itching  . Penicillins Rash    Has patient had a PCN reaction causing immediate rash, facial/tongue/throat swelling, SOB or lightheadedness with hypotension: Yes Has patient had a PCN reaction causing severe rash involving mucus membranes or skin necrosis: No Has patient had a PCN reaction that required hospitalization: No Has patient had a PCN reaction occurring within the last 10 years: No If all of the above answers are "NO", then may proceed with Cephalosporin use.   :   Her Current Medications Are:  Outpatient Encounter Medications as of 12/03/2017  Medication Sig  . ACCU-CHEK SOFTCLIX LANCETS lancets Use to test blood sugar 3 times daily. Dx: E11.8  . albuterol (PROVENTIL HFA;VENTOLIN HFA) 108 (90 Base) MCG/ACT inhaler Inhale 2 puffs into the lungs every 6 (six) hours as needed for wheezing or shortness of breath.  . Alcohol Swabs (B-D SINGLE USE SWABS REGULAR) PADS Use to test blood sugar 3 times daily  . Blood Glucose Calibration (ACCU-CHEK AVIVA) SOLN Use as directed  . Blood Glucose Monitoring Suppl (ACCU-CHEK AVIVA PLUS) w/Device KIT Use to check blood sugar 3 times daily  . clonazePAM (KLONOPIN) 1 MG tablet Take 1 tablet (1 mg  total) by mouth daily. Every other day for 10 days then every 3rd day x 7 days then every 4th days for 7 days.  . cyanocobalamin 100 MCG tablet Take 100 mcg by mouth daily.  . Dulaglutide (TRULICITY) 1.61 WR/6.0AV SOPN Inject 0.75 mg into the skin once a week.  . gabapentin (NEURONTIN) 400 MG capsule Take 2 capsules (800 mg total) by mouth at bedtime.  Marland Kitchen glucose blood (ACCU-CHEK AVIVA) test strip Use to test blood sugar 3 times daily. Dx: 11.8  . hydrochlorothiazide (HYDRODIURIL) 25 MG tablet TAKE 1 TABLET BY MOUTH EVERY DAY  . HYDROcodone-acetaminophen (NORCO/VICODIN) 5-325 MG tablet Take 1 tablet by mouth every 6 (six) hours as needed for moderate  pain.  . ipratropium (ATROVENT) 0.06 % nasal spray Place 2 sprays into both nostrils 4 (four) times daily.  . multivitamin-lutein (OCUVITE-LUTEIN) CAPS capsule Take 1 capsule by mouth daily.  Marland Kitchen omeprazole (PRILOSEC) 40 MG capsule Take 1 capsule (40 mg total) by mouth daily.  . [DISCONTINUED] fluticasone (FLONASE) 50 MCG/ACT nasal spray Place 2 sprays into both nostrils daily.  . [DISCONTINUED] methocarbamol (ROBAXIN) 500 MG tablet Take 1 tablet (500 mg total) by mouth every 6 (six) hours as needed for muscle spasms.   No facility-administered encounter medications on file as of 12/03/2017.   :   Review of Systems:  Out of a complete 14 point review of systems, all are reviewed and negative with the exception of these symptoms as listed below:  Review of Systems  Neurological:       Pt presents today to discuss her numbness and tingling in her toes. Pt is a type 2 diabetic. This has been going on for the past 5 months.    Objective:  Neurological Exam  Physical Exam Physical Examination:   Vitals:   12/03/17 1435  BP: 127/87  Pulse: 86    General Examination: The patient is a very pleasant 67 y.o. female in no acute distress. She appears well-developed and well-nourished and well groomed.   HEENT: Normocephalic, atraumatic, pupils are  equal, round and reactive to light and accommodation. Extraocular tracking is good without limitation to gaze excursion or nystagmus noted. Normal smooth pursuit is noted. Hearing is grossly intact. Face is symmetric with normal facial animation, speech is clear with no dysarthria noted. There is no hypophonia. There is no lip, neck/head, jaw or voice tremor. Neck is supple with full range of passive and active motion.   Chest: Clear to auscultation without wheezing, rhonchi or crackles noted.  Heart: S1+S2+0, regular and normal without murmurs, rubs or gallops noted.   Abdomen: Soft, non-tender and non-distended with normal bowel sounds appreciated on auscultation.  Extremities: There is no pitting edema in the distal lower extremities bilaterally. Pedal pulses are intact.  Skin: Warm and dry without trophic changes noted. There are mild varicose veins.  Musculoskeletal: exam reveals no obvious joint deformities, tenderness or joint swelling or erythema.   Neurologically:  Mental status: The patient is awake, alert and oriented in all 4 spheres. Her immediate and remote memory, attention, language skills and fund of knowledge are appropriate. There is no evidence of aphasia, agnosia, apraxia or anomia. Speech is clear with normal prosody and enunciation. Thought process is linear. Mood is constricted and affect is blunted.  Cranial nerves II - XII are as described above under HEENT exam. In addition: shoulder shrug is normal with equal shoulder height noted. Motor exam: Normal bulk, strength and tone is noted. There is no drift, tremor or rebound. Reflexes are 1+ in the upper extremities, trace in the knees and absent in the ankles. Toes are downgoing. On fine motor skill she has no obvious abnormality. Sensory exam reveals intact sensation to all modalities in the upper extremities but decreased sensation to all modalities including light touch, pinprick, vibration and temperature sense in the  distal lower extremities up to above ankles bilaterally. She stands up with no significant difficulty, gait is normal.  Cerebellar testing shows no ataxia or dysmetria, no intention tremor. She has no postural or action tremor.  Assessment and Plan:    In summary, Skyley Grandmaison is a very pleasant 41 y.o.-year old female with an underlying medical history of lumbar  spinal stenosis, status post previous lumbar spine surgery, overweight state, hypertension, hyperlipidemia, arthritis, anxiety, and diabetes, who presents for evaluation of numbness and tingling of her feet, primarily her toes. She has had symptoms for about 5 months. Her history and examination are in keeping with neuropathy, likely diabetic neuropathy. Her exam shows findings affecting her distal lower extremities bilaterally. She is advised that unfortunately there is no reversing of neuropathy symptoms and signs and there is symptomatic treatment available but she is already on gabapentin and she is also on pain medication. I would not suggest any symptomatic medication especially since she does not actually have burning sensation or pain at this time. We will proceed with additional blood work to look for treatable causes of neuropathy otherwise. We will also schedule her for EMG and nerve conduction testing. We will call her with her test results and follow-up as needed. She is advised that prevention and therefore a strict diabetes control is key for her condition. She is encouraged to drink more water as she typically does not drink water, she likes to drink diet soda. I answered all her questions today and the patient was in agreement.  Thank you very much for allowing me to participate in the care of this nice patient. If I can be of any further assistance to you please do not hesitate to call me at [phone].  Sincerely,   [name], MD, PhD

## 2017-12-03 NOTE — Patient Instructions (Addendum)
Your history and exam are in keeping with diabetic neuropathy or nerve damage.  We will do an EMG and nerve conduction velocity test, which is an electrical nerve and muscle test, which we will schedule. We will call you with the results.  Please drink more water.   We will check blood work today and call you with the test results.

## 2017-12-04 ENCOUNTER — Telehealth: Payer: Self-pay | Admitting: *Deleted

## 2017-12-04 LAB — B12 AND FOLATE PANEL
Folate: 8.3 ng/mL (ref >3.0)
Vitamin B-12: 1239 pg/mL (ref 232–1245)

## 2017-12-04 LAB — ANA W/REFLEX: Anti Nuclear Antibody(ANA): NEGATIVE

## 2017-12-04 LAB — RPR: RPR Ser Ql: NONREACTIVE

## 2017-12-04 LAB — SEDIMENTATION RATE: Sed Rate: 7 mm/hr (ref 0–40)

## 2017-12-04 LAB — TSH: TSH: 0.698 u[IU]/mL (ref 0.450–4.500)

## 2017-12-04 NOTE — Telephone Encounter (Signed)
-----   Message from [name], MD sent at 12/04/2017 11:14 AM EDT ----- Pls let pt know: Labs so far normal, one test result pending. Will update when available.  [name]

## 2017-12-04 NOTE — Telephone Encounter (Signed)
Called and spoke with pt about lab results. Pt verbalized understanding and appreciation.

## 2017-12-04 NOTE — Progress Notes (Signed)
Pls let pt know: Labs so far normal, one test result pending. Will update when available.  Michel Bickers

## 2017-12-07 ENCOUNTER — Other Ambulatory Visit: Payer: Self-pay | Admitting: Family Medicine

## 2017-12-07 DIAGNOSIS — E1149 Type 2 diabetes mellitus with other diabetic neurological complication: Secondary | ICD-10-CM

## 2017-12-17 ENCOUNTER — Other Ambulatory Visit: Payer: Self-pay | Admitting: Family Medicine

## 2017-12-17 DIAGNOSIS — F32A Depression, unspecified: Secondary | ICD-10-CM

## 2017-12-17 DIAGNOSIS — F329 Major depressive disorder, single episode, unspecified: Secondary | ICD-10-CM

## 2017-12-17 DIAGNOSIS — F419 Anxiety disorder, unspecified: Secondary | ICD-10-CM

## 2017-12-18 ENCOUNTER — Other Ambulatory Visit: Payer: Self-pay | Admitting: Family Medicine

## 2017-12-18 DIAGNOSIS — F32A Depression, unspecified: Secondary | ICD-10-CM

## 2017-12-18 DIAGNOSIS — F419 Anxiety disorder, unspecified: Secondary | ICD-10-CM

## 2017-12-18 DIAGNOSIS — F329 Major depressive disorder, single episode, unspecified: Secondary | ICD-10-CM

## 2017-12-18 NOTE — Telephone Encounter (Signed)
Spoke with patient and she stated that she is currently taking medication twice daily.

## 2017-12-18 NOTE — Telephone Encounter (Signed)
Clonazepam dose was recently decreased from 1mg  tab bid to 1 tab daily at night. Is she still taking this dose? Because last refill was 11/19/17.  Thanks, BJ

## 2017-12-21 ENCOUNTER — Ambulatory Visit: Payer: Medicare HMO | Admitting: Family Medicine

## 2018-01-01 ENCOUNTER — Encounter: Payer: Medicare HMO | Admitting: Neurology

## 2018-01-04 ENCOUNTER — Encounter: Payer: Medicare HMO | Attending: Physical Medicine & Rehabilitation | Admitting: Physical Medicine & Rehabilitation

## 2018-01-04 ENCOUNTER — Other Ambulatory Visit: Payer: Self-pay

## 2018-01-04 ENCOUNTER — Encounter: Payer: Self-pay | Admitting: Physical Medicine & Rehabilitation

## 2018-01-04 VITALS — BP 155/77 | HR 81 | Ht 68.0 in | Wt 195.0 lb

## 2018-01-04 DIAGNOSIS — G894 Chronic pain syndrome: Secondary | ICD-10-CM | POA: Diagnosis not present

## 2018-01-04 DIAGNOSIS — G8929 Other chronic pain: Secondary | ICD-10-CM | POA: Diagnosis not present

## 2018-01-04 DIAGNOSIS — M7918 Myalgia, other site: Secondary | ICD-10-CM

## 2018-01-04 DIAGNOSIS — M5030 Other cervical disc degeneration, unspecified cervical region: Secondary | ICD-10-CM | POA: Insufficient documentation

## 2018-01-04 DIAGNOSIS — E785 Hyperlipidemia, unspecified: Secondary | ICD-10-CM | POA: Insufficient documentation

## 2018-01-04 DIAGNOSIS — M545 Low back pain: Secondary | ICD-10-CM | POA: Diagnosis not present

## 2018-01-04 DIAGNOSIS — E119 Type 2 diabetes mellitus without complications: Secondary | ICD-10-CM | POA: Insufficient documentation

## 2018-01-04 DIAGNOSIS — F4310 Post-traumatic stress disorder, unspecified: Secondary | ICD-10-CM | POA: Diagnosis not present

## 2018-01-04 DIAGNOSIS — F411 Generalized anxiety disorder: Secondary | ICD-10-CM | POA: Diagnosis not present

## 2018-01-04 DIAGNOSIS — G2581 Restless legs syndrome: Secondary | ICD-10-CM | POA: Insufficient documentation

## 2018-01-04 DIAGNOSIS — R102 Pelvic and perineal pain: Secondary | ICD-10-CM | POA: Insufficient documentation

## 2018-01-04 DIAGNOSIS — M25512 Pain in left shoulder: Secondary | ICD-10-CM | POA: Diagnosis not present

## 2018-01-04 DIAGNOSIS — M542 Cervicalgia: Secondary | ICD-10-CM | POA: Insufficient documentation

## 2018-01-04 DIAGNOSIS — T1490XA Injury, unspecified, initial encounter: Secondary | ICD-10-CM | POA: Insufficient documentation

## 2018-01-04 DIAGNOSIS — F329 Major depressive disorder, single episode, unspecified: Secondary | ICD-10-CM | POA: Insufficient documentation

## 2018-01-04 DIAGNOSIS — I10 Essential (primary) hypertension: Secondary | ICD-10-CM | POA: Diagnosis not present

## 2018-01-04 MED ORDER — BUSPIRONE HCL 5 MG PO TABS: 5.0000 mg | ORAL_TABLET | Freq: Two times a day (BID) | ORAL | 3 refills | Status: DC

## 2018-01-04 MED ORDER — HYDROCODONE-ACETAMINOPHEN 5-325 MG PO TABS: 1.0000 | ORAL_TABLET | Freq: Four times a day (QID) | ORAL | 0 refills | Status: DC | PRN
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
## 2018-01-04 NOTE — Patient Instructions (Addendum)
PLEASE FEEL FREE TO CALL OUR OFFICE WITH ANY PROBLEMS OR QUESTIONS ([phone])    FIND TIME TO DO THINGS FOR FUN, RELAXATION.

## 2018-01-07 ENCOUNTER — Encounter: Payer: Self-pay | Admitting: Family Medicine

## 2018-01-08 ENCOUNTER — Encounter: Payer: Self-pay | Admitting: *Deleted

## 2018-01-11 ENCOUNTER — Ambulatory Visit: Payer: Medicare HMO | Admitting: Physical Medicine & Rehabilitation

## 2018-01-24 ENCOUNTER — Encounter: Payer: Self-pay | Admitting: Family Medicine

## 2018-01-24 DIAGNOSIS — E1149 Type 2 diabetes mellitus with other diabetic neurological complication: Secondary | ICD-10-CM

## 2018-01-25 MED ORDER — DULAGLUTIDE 0.75 MG/0.5ML ~~LOC~~ SOAJ: SUBCUTANEOUS | 0 refills | Status: DC

## 2018-02-27 DIAGNOSIS — M7122 Synovial cyst of popliteal space [Baker], left knee: Secondary | ICD-10-CM | POA: Diagnosis not present

## 2018-02-27 DIAGNOSIS — I10 Essential (primary) hypertension: Secondary | ICD-10-CM | POA: Diagnosis not present

## 2018-02-28 ENCOUNTER — Encounter: Payer: Self-pay | Admitting: Family Medicine

## 2018-03-01 ENCOUNTER — Ambulatory Visit (INDEPENDENT_AMBULATORY_CARE_PROVIDER_SITE_OTHER): Payer: Medicare HMO | Admitting: Family Medicine

## 2018-03-01 ENCOUNTER — Encounter: Payer: Self-pay | Admitting: Family Medicine

## 2018-03-01 VITALS — BP 126/84 | HR 95 | Temp 97.7°F | Resp 12 | Ht 68.0 in | Wt 199.4 lb

## 2018-03-01 DIAGNOSIS — M7122 Synovial cyst of popliteal space [Baker], left knee: Secondary | ICD-10-CM | POA: Diagnosis not present

## 2018-03-01 DIAGNOSIS — R0989 Other specified symptoms and signs involving the circulatory and respiratory systems: Secondary | ICD-10-CM

## 2018-03-01 DIAGNOSIS — L821 Other seborrheic keratosis: Secondary | ICD-10-CM

## 2018-03-01 DIAGNOSIS — J988 Other specified respiratory disorders: Secondary | ICD-10-CM | POA: Diagnosis not present

## 2018-03-01 DIAGNOSIS — J989 Respiratory disorder, unspecified: Secondary | ICD-10-CM

## 2018-03-01 MED ORDER — ALBUTEROL SULFATE HFA 108 (90 BASE) MCG/ACT IN AERS: 2.0000 | INHALATION_SPRAY | Freq: Four times a day (QID) | RESPIRATORY_TRACT | 1 refills | Status: DC | PRN

## 2018-03-01 MED ORDER — DOXYCYCLINE HYCLATE 100 MG PO TABS: 100.0000 mg | ORAL_TABLET | Freq: Two times a day (BID) | ORAL | 0 refills | Status: AC

## 2018-03-01 NOTE — Progress Notes (Signed)
ACUTE VISIT  HPI:  Chief Complaint  Patient presents with  . Cough    sx started 8 days ago  . Headache  . Baker cyst    [name] is a 67 y.o.female here today complaining of 8 days of respiratory symptoms. Bitemporal and frontal pressure headache,nasal congestion,cough with clear sputum, and post nasal drainage. She has not had fever,chills, or sore throat.  Symptoms are worse in the am and pm.  + Mild intermittent wheezing. She denies SOB or CP.  She is concerned about being contagious, her husband in in the hospital after cancer related surgery.She has not been able to visit.  HPI  She has Albuterol inh left from prior illness and has helped.She needs a refill. No Hx of tobacco use, wheezing episodes with viral respiratory infections.  No Hx of recent travel. No sick contact. No known insect bite.  Hx of allergies:Negative.   OTC medications for this problem: Mucinex and Tylenol.  Skin lesions on face,"aging spot." Hx of skin cancer, BCC about 14 years ago, she wants to be sure this is not a lesion to be worried about. She has had lesion for about 2-3 years,it seems to be growing. Not tender and no erythema or easy bleeding. She has not tried OTC treatments.  She also mentions that since her last OV, she has been in the ER ([facility]) on 02/27/18. She presented to the ER c/o left knee pain,posterior aspect. Dx with baker cyst and treated with Depo Medrol 80 mg IM. She has noted great improvement but still feels a lump behind knee. No erythema, mild limitation of ROM due to pain.  In the ER she had blood work done and imaging, including [facility]. Pain is now 2/10, intermittent.  DM II, BS's a "very good."  Review of Systems  Constitutional: Positive for activity change and fatigue. Negative for appetite change and fever.  HENT: Positive for congestion, postnasal drip and sinus pressure. Negative for ear pain, mouth sores,  nosebleeds, rhinorrhea, sore throat and voice change.   Eyes: Negative for discharge and redness.  Respiratory: Positive for cough and wheezing. Negative for shortness of breath.   Cardiovascular: Negative for chest pain, palpitations and leg swelling.  Gastrointestinal: Negative for abdominal pain, diarrhea, nausea and vomiting.  Musculoskeletal: Positive for arthralgias. Negative for myalgias.  Skin: Negative for rash.  Allergic/Immunologic: Negative for environmental allergies.  Neurological: Negative for weakness and headaches.  Hematological: Negative for adenopathy. Does not bruise/bleed easily.  Psychiatric/Behavioral: Negative for confusion. The patient is nervous/anxious.       Current Outpatient Medications on File Prior to Visit  Medication Sig Dispense Refill  . ACCU-CHEK SOFTCLIX LANCETS lancets Use to test blood sugar 3 times daily. Dx: E11.8 100 each 12  . Alcohol Swabs (B-D SINGLE USE SWABS REGULAR) PADS Use to test blood sugar 3 times daily 100 each 3  . Blood Glucose Calibration (ACCU-CHEK AVIVA) SOLN Use as directed 1 each 2  . Blood Glucose Monitoring Suppl (ACCU-CHEK AVIVA PLUS) w/Device KIT Use to check blood sugar 3 times daily 1 kit 1  . busPIRone (BUSPAR) 5 MG tablet Take 1 tablet (5 mg total) by mouth 2 (two) times daily. 60 tablet 3  . clonazePAM (KLONOPIN) 1 MG tablet TAKE 1 TABLET BY MOUTH TWICE DAILY AS NEEDED FOR ANXIETY 60 tablet 2  . cyanocobalamin 100 MCG tablet Take 100 mcg by mouth daily.    . Dulaglutide (TRULICITY) 3.33 LK/5.6YB  SOPN INJECT 0.'75MG'$  INTO THE SKIN ONCE A WEEK 4 mL 0  . gabapentin (NEURONTIN) 400 MG capsule Take 2 capsules (800 mg total) by mouth at bedtime. 60 capsule 3  . glucose blood (ACCU-CHEK AVIVA) test strip Use to test blood sugar 3 times daily. Dx: 11.8 100 each 12  . hydrochlorothiazide (HYDRODIURIL) 25 MG tablet TAKE 1 TABLET BY MOUTH EVERY DAY 90 tablet 0  . HYDROcodone-acetaminophen (NORCO/VICODIN) 5-325 MG tablet Take 1  tablet by mouth every 6 (six) hours as needed for moderate pain. 100 tablet 0  . hydrOXYzine (ATARAX/VISTARIL) 25 MG tablet     . ipratropium (ATROVENT) 0.06 % nasal spray Place 2 sprays into both nostrils 4 (four) times daily. 15 mL 3  . multivitamin-lutein (OCUVITE-LUTEIN) CAPS capsule Take 1 capsule by mouth daily.    . naproxen (NAPROSYN) 500 MG tablet   0  . omeprazole (PRILOSEC) 40 MG capsule Take 1 capsule (40 mg total) by mouth daily. 90 capsule 3   No current facility-administered medications on file prior to visit.      Past Medical History:  Diagnosis Date  . Anxiety   . Arthritis   . Diabetes mellitus without complication ([place])   . Hyperlipidemia   . Hypertension   . PTSD (post-traumatic stress disorder)    Allergies  Allergen Reactions  . Dilaudid [Hydromorphone] Nausea And Vomiting and Other (See Comments)    Made the patient feel "spaced out," also  . Morphine And Related Itching  . Penicillins Rash    Has patient had a PCN reaction causing immediate rash, facial/tongue/throat swelling, SOB or lightheadedness with hypotension: Yes Has patient had a PCN reaction causing severe rash involving mucus membranes or skin necrosis: No Has patient had a PCN reaction that required hospitalization: No Has patient had a PCN reaction occurring within the last 10 years: No If all of the above answers are "NO", then may proceed with Cephalosporin use.     Social History   Socioeconomic History  . Marital status: Divorced    Spouse name: Not on file  . Number of children: Not on file  . Years of education: Not on file  . Highest education level: Not on file  Occupational History  . Not on file  Social Needs  . Financial resource strain: Not on file  . Food insecurity:    Worry: Not on file    Inability: Not on file  . Transportation needs:    Medical: Not on file    Non-medical: Not on file  Tobacco Use  . Smoking status: Never Smoker  . Smokeless tobacco: Never  Used  Substance and Sexual Activity  . Alcohol use: No  . Drug use: No  . Sexual activity: Not on file  Lifestyle  . Physical activity:    Days per week: Not on file    Minutes per session: Not on file  . Stress: Not on file  Relationships  . Social connections:    Talks on phone: Not on file    Gets together: Not on file    Attends religious service: Not on file    Active member of club or organization: Not on file    Attends meetings of clubs or organizations: Not on file    Relationship status: Not on file  Other Topics Concern  . Not on file  Social History Narrative  . Not on file    Vitals:   03/01/18 1453  BP: 126/84  Pulse: 95  Resp:  12  Temp: 97.7 F (36.5 C)  SpO2: 97%   Body mass index is 30.31 kg/m.    Physical Exam  Nursing note and vitals reviewed. Constitutional: She is oriented to person, place, and time. She appears well-developed. She does not appear ill. No distress.  HENT:  Head: Atraumatic.  Right Ear: Tympanic membrane, external ear and ear canal normal.  Left Ear: Tympanic membrane, external ear and ear canal normal.  Nose: Right sinus exhibits no maxillary sinus tenderness. Left sinus exhibits no maxillary sinus tenderness.  Mouth/Throat: Oropharynx is clear and moist and mucous membranes are normal.  Nasal voice. Postnasal drainage. Mild tenderness upon pressing frontal sinuses.  Eyes: Conjunctivae are normal.  Neck: No muscular tenderness present. No edema and no erythema present.  Cardiovascular: Normal rate and regular rhythm.  No murmur heard. Respiratory: Effort normal and breath sounds normal. No stridor. No respiratory distress.  Musculoskeletal:       Left knee: She exhibits decreased range of motion (mild). She exhibits no erythema. Tenderness found.       Legs: Left poplitea fossa with bulged area, no erythema or local heat. Flexion mildly limited due to pain.  Lymphadenopathy:       Head (right side): No submandibular  adenopathy present.       Head (left side): No submandibular adenopathy present.    She has no cervical adenopathy.  Neurological: She is alert and oriented to person, place, and time. She has normal strength.  Skin: Skin is warm. No rash noted. No erythema.  Left cheek with mildly hyperpigmented rough surface lesion x 2,regular borders, 2-3 mm each one. Not tender.  Psychiatric: Her mood appears anxious. Her affect is labile.  Well groomed, good eye contact.    ASSESSMENT AND PLAN:   Ms. Yosselin was seen today for cough, headache and baker cyst.  Diagnoses and all orders for this visit:  Respiratory tract infection Explained that it seems to be viral, in which case abx will not help. Very anxious, she feels like abx is needed, Doxycycline x 7 days recommended. Instructed about warning signs,side effects of abx discussed.  -     doxycycline (VIBRA-TABS) 100 MG tablet; Take 1 tablet (100 mg total) by mouth 2 (two) times daily for 7 days.  Baker cyst, left Improving. Educated about Dx and treatment options. He would like to hold on ortho referral.  Seborrheic keratosis Reassured about skin lesion. Instructed to continue monitoring.  Reactive airway disease that is not asthma No wheezing appreciated today. Recommended Albuterol inh 2 puff every 6 hours for a week then as needed for wheezing or shortness of breath.  I do not think imaging is needed today. Instructed about warning signs.  -     albuterol (PROVENTIL HFA;VENTOLIN HFA) 108 (90 Base) MCG/ACT inhaler; Inhale 2 puffs into the lungs every 6 (six) hours as needed for wheezing or shortness of breath.       Rever Pichette G. Martin[place], MD  [facility]. [place] office.

## 2018-03-01 NOTE — Patient Instructions (Addendum)
A few things to remember from today's visit:   Respiratory tract infection - Plan: doxycycline (VIBRA-TABS) 100 MG tablet  Baker cyst, left  Seborrheic keratosis  Reactive airway disease that is not asthma - Plan: albuterol (PROVENTIL HFA;VENTOLIN HFA) 108 (90 Base) MCG/ACT inhaler  viral infections are self-limited and we treat each symptom depending of severity.  Over the counter medications as decongestants and cold medications usually help, they need to be taken with caution if there is a history of high blood pressure or palpitations. Tylenol and/or Ibuprofen also helps with most symptoms (headache, muscle aching, fever,etc) Plenty of fluids. Honey helps with cough. Steam inhalations helps with runny nose, nasal congestion, and may prevent sinus infections. Cough and nasal congestion could last a few days and sometimes weeks. Please follow in not any better in 1-2 weeks or if symptoms get worse.   Albuterol inh 2 puff every 6 hours for a week then as needed for wheezing or shortness of breath.   Please be sure medication list is accurate. If a new problem present, please set up appointment sooner than planned today.

## 2018-03-04 ENCOUNTER — Other Ambulatory Visit: Payer: Self-pay

## 2018-03-04 ENCOUNTER — Emergency Department (HOSPITAL_COMMUNITY)
Admission: EM | Admit: 2018-03-04 | Discharge: 2018-03-04 | Disposition: A | Discharge status: Home / Self Care | Payer: Medicare HMO | Attending: Emergency Medicine | Admitting: Emergency Medicine

## 2018-03-04 ENCOUNTER — Other Ambulatory Visit: Payer: Self-pay | Admitting: Family Medicine

## 2018-03-04 ENCOUNTER — Emergency Department (HOSPITAL_COMMUNITY): Payer: Medicare HMO

## 2018-03-04 ENCOUNTER — Encounter (HOSPITAL_COMMUNITY): Payer: Self-pay

## 2018-03-04 DIAGNOSIS — I69359 Hemiplegia and hemiparesis following cerebral infarction affecting unspecified side: Secondary | ICD-10-CM | POA: Diagnosis not present

## 2018-03-04 DIAGNOSIS — J209 Acute bronchitis, unspecified: Secondary | ICD-10-CM | POA: Diagnosis not present

## 2018-03-04 DIAGNOSIS — F0150 Vascular dementia without behavioral disturbance: Secondary | ICD-10-CM | POA: Diagnosis not present

## 2018-03-04 DIAGNOSIS — R4182 Altered mental status, unspecified: Secondary | ICD-10-CM | POA: Diagnosis present

## 2018-03-04 DIAGNOSIS — I69328 Other speech and language deficits following cerebral infarction: Secondary | ICD-10-CM | POA: Diagnosis not present

## 2018-03-04 DIAGNOSIS — R569 Unspecified convulsions: Secondary | ICD-10-CM | POA: Insufficient documentation

## 2018-03-04 DIAGNOSIS — Z79899 Other long term (current) drug therapy: Secondary | ICD-10-CM | POA: Diagnosis not present

## 2018-03-04 DIAGNOSIS — I10 Essential (primary) hypertension: Secondary | ICD-10-CM | POA: Insufficient documentation

## 2018-03-04 DIAGNOSIS — E1149 Type 2 diabetes mellitus with other diabetic neurological complication: Secondary | ICD-10-CM

## 2018-03-04 DIAGNOSIS — R05 Cough: Secondary | ICD-10-CM | POA: Diagnosis not present

## 2018-03-04 LAB — CBC WITH DIFFERENTIAL/PLATELET
Abs Immature Granulocytes: 0.03 10*3/uL (ref 0.00–0.07)
Basophils Absolute: 0 10*3/uL (ref 0.0–0.1)
Basophils Relative: 1 %
Eosinophils Absolute: 0.2 10*3/uL (ref 0.0–0.5)
Eosinophils Relative: 3 %
HCT: 38.3 % (ref 36.0–46.0)
Hemoglobin: 13.2 g/dL (ref 12.0–15.0)
Immature Granulocytes: 1 %
Lymphocytes Relative: 19 %
Lymphs Abs: 0.9 10*3/uL (ref 0.7–4.0)
MCH: 31.4 pg (ref 26.0–34.0)
MCHC: 34.5 g/dL (ref 30.0–36.0)
MCV: 91 fL (ref 80.0–100.0)
Monocytes Absolute: 0.7 10*3/uL (ref 0.1–1.0)
Monocytes Relative: 14 %
Neutro Abs: 3.1 10*3/uL (ref 1.7–7.7)
Neutrophils Relative %: 62 %
Platelets: 263 10*3/uL (ref 150–400)
RBC: 4.21 MIL/uL (ref 3.87–5.11)
RDW: 11.5 % (ref 11.5–15.5)
WBC: 4.8 10*3/uL (ref 4.0–10.5)
nRBC: 0 % (ref 0.0–0.2)

## 2018-03-04 LAB — COMPREHENSIVE METABOLIC PANEL
ALT: 17 U/L (ref 0–44)
AST: 16 U/L (ref 15–41)
Albumin: 4 g/dL (ref 3.5–5.0)
Alkaline Phosphatase: 73 U/L (ref 38–126)
Anion gap: 10 (ref 5–15)
BUN: 13 mg/dL (ref 8–23)
CO2: 27 mmol/L (ref 22–32)
Calcium: 9.1 mg/dL (ref 8.9–10.3)
Chloride: 101 mmol/L (ref 98–111)
Creatinine, Ser: 0.75 mg/dL (ref 0.44–1.00)
GFR calc Af Amer: >60 mL/min (ref >60)
GFR calc non Af Amer: >60 mL/min (ref >60)
Glucose, Bld: 153 mg/dL — ABNORMAL HIGH (ref 70–99)
Potassium: 3.3 mmol/L — ABNORMAL LOW (ref 3.5–5.1)
Sodium: 138 mmol/L (ref 135–145)
Total Bilirubin: 0.7 mg/dL (ref 0.3–1.2)
Total Protein: 7.5 g/dL (ref 6.5–8.1)

## 2018-03-04 LAB — INFLUENZA PANEL BY PCR (TYPE A & B)
Influenza A By PCR: NEGATIVE
Influenza B By PCR: NEGATIVE

## 2018-03-04 MED ORDER — PREDNISONE 20 MG PO TABS
40.0000 mg | ORAL_TABLET | Freq: Once | ORAL | Status: AC
Start: 2018-03-04 — End: 2018-03-04
  Administered 2018-03-04: 40 mg via ORAL
  Filled 2018-03-04: qty 2

## 2018-03-04 MED ORDER — PREDNISONE 20 MG PO TABS: 40.0000 mg | ORAL_TABLET | Freq: Every day | ORAL | 0 refills | Status: DC

## 2018-03-04 MED ORDER — BENZONATATE 200 MG PO CAPS: 200.0000 mg | ORAL_CAPSULE | Freq: Three times a day (TID) | ORAL | 0 refills | Status: AC | PRN

## 2018-03-04 MED ORDER — BENZONATATE 100 MG PO CAPS
200.0000 mg | ORAL_CAPSULE | Freq: Once | ORAL | Status: AC
  Administered 2018-03-04: 200 mg via ORAL
  Filled 2018-03-04: qty 2

## 2018-03-04 MED ORDER — ALBUTEROL SULFATE (2.5 MG/3ML) 0.083% IN NEBU
5.0000 mg | INHALATION_SOLUTION | Freq: Once | RESPIRATORY_TRACT | Status: AC
  Administered 2018-03-04: 5 mg via RESPIRATORY_TRACT
  Filled 2018-03-04: qty 6

## 2018-03-04 NOTE — Discharge Instructions (Signed)
Please read the attached instructions.  Please be aware that bronchitis may give you a cough for several weeks but using the medications that we have given should help.  Please be aware that prednisone will likely cause your blood sugar to rise.  Make sure that you are eating a diabetic healthy diet very low in carbohydrates.  If you should develop severe or sending symptoms, return to the emergency department immediately otherwise follow-up with your doctor in 48 hours.  Please finish the doxycycline that you were prescribed by your doctor.  You may use the albuterol treatments as needed every 4 hours for increasing shortness of breath wheezing or coughing

## 2018-03-04 NOTE — ED Notes (Signed)
Pt ambulated per Dr. Sabra Heck.   Initial o2 was 100%. Dropped to 96% at lowest.   Initial Hr was 95. Rose to 115 max.   Pt stated her head and chest hurt prior. Gait was unsteady. Pt had coughing fits while ambulating. Could not finish walking around ED b/c of coughing. Pt said she felt sob and had cp.

## 2018-03-04 NOTE — ED Triage Notes (Signed)
Pt c/o cough, productive at times,  and sob for the past 11 days.  Saw pcp on 11/25 and was put on doxycycline.  Pt c/o chest pain that is worse with coughing.   Pt unsure if has had fever.

## 2018-03-04 NOTE — ED Provider Notes (Signed)
[facility] EMERGENCY DEPARTMENT Provider Note   CSN: [id number] Arrival date & time: 03/04/18  1141     History   Chief Complaint Chief Complaint  Patient presents with  . Cough    HPI [name] is a 67 y.o. female.  HPI  The patient is a pleasant 67 year old female, she has a known history of diabetes hyperlipidemia hypertension and PTSD, she reports that she has been sick for approximately 10 or 11 days, she states that she has been starting with runny nose, coughing, headache, has some body aches but states it does not feel like the flu, the cough is been persistent and gradually worsening and ultimately the patient was seen 3 days ago at her doctor's office at which time she was treated with doxycycline and albuterol metered-dose inhaler which she states are not helping at this point.  She still denies fevers but states that she has had a difficult time sleeping because of the coughing and is now starting to have some bilateral chest discomfort with the coughing as well.  She reports she was recently in the hospital with her husband who is currently undergoing a surgical treatment for cancer, when she talks about that she becomes very tearful which makes her cough even more, she is upset because she does not know if she is going to be able to pick them up because of her coughing.  She has been on the doxycycline for the last 3 days, has not improved.  She denies fevers  The patient is a diabetic, she has been taking her medications exactly as prescribed, she has not been having any hyperglycemia reporting blood sugars in the 130 range.  Past Medical History:  Diagnosis Date  . Anxiety   . Arthritis   . Diabetes mellitus without complication ([place])   . Hyperlipidemia   . Hypertension   . PTSD (post-traumatic stress disorder)     Patient Active Problem List   Diagnosis Date Noted  . Myalgia due to statin 11/18/2017  . Anxiety and depression 10/16/2017  . Insomnia  10/16/2017  . Chronic pain syndrome 09/07/2017  . Spinal stenosis of lumbar region 07/08/2017  . DOE (dyspnea on exertion) 12/16/2016  . Family history of early CAD 12/16/2016  . Other chest pain 12/16/2016  . Arthritis of left acromioclavicular joint 08/13/2016  . Left shoulder pain 05/12/2016  . Cervical spondylosis without myelopathy 05/12/2016  . Rotator cuff syndrome, left 05/12/2016  . Restless leg syndrome 05/12/2016  . Primary osteoarthritis, left hand 05/12/2016  . MELENA 10/18/2008  . GASTROPARESIS 05/30/2008  . Type 2 diabetes mellitus with neurological complications ([place]) 88/50/2774  . HYPERCHOLESTEROLEMIA 05/04/2008  . HYPOKALEMIA 05/04/2008  . GERD 05/04/2008  . Constipation 05/04/2008  . HEMATEMESIS 05/04/2008  . Essential hypertension 05/04/2008    Past Surgical History:  Procedure Laterality Date  . ACHILLES TENDON SURGERY Left   . BACK SURGERY    . BLADDER SUSPENSION    . CHOLECYSTECTOMY    . NOSE SURGERY     x 2   . OTHER SURGICAL HISTORY     Breast Duct removed  . PARTIAL HYSTERECTOMY    . RECTOCELE REPAIR    . VEIN SURGERY Right    leg     OB History   None      Home Medications    Prior to Admission medications   Medication Sig Start Date End Date Taking? Authorizing Provider  ACCU-CHEK SOFTCLIX LANCETS lancets Use to test blood sugar 3  times daily. Dx: E11.8 08/11/17  Yes Martinique, Betty G, MD  Alcohol Swabs (B-D SINGLE USE SWABS REGULAR) PADS Use to test blood sugar 3 times daily 08/11/17  Yes Martinique, Betty G, MD  Blood Glucose Calibration (ACCU-CHEK AVIVA) SOLN Use as directed 08/11/17  Yes Martinique, Betty G, MD  Blood Glucose Monitoring Suppl (ACCU-CHEK AVIVA PLUS) w/Device KIT Use to check blood sugar 3 times daily 08/11/17  Yes Martinique, Betty G, MD  glucose blood (ACCU-CHEK AVIVA) test strip Use to test blood sugar 3 times daily. Dx: 11.8 08/11/17  Yes Martinique, Betty G, MD  albuterol (PROVENTIL HFA;VENTOLIN HFA) 108 (90 Base) MCG/ACT inhaler Inhale 2  puffs into the lungs every 6 (six) hours as needed for wheezing or shortness of breath. 03/01/18 12/26/18  Martinique, Betty G, MD  benzonatate (TESSALON) 200 MG capsule Take 1 capsule (200 mg total) by mouth 3 (three) times daily as needed for up to 7 days for cough. 03/04/18 03/11/18  Noemi Chapel, MD  busPIRone (BUSPAR) 5 MG tablet Take 1 tablet (5 mg total) by mouth 2 (two) times daily. 01/04/18   Meredith Staggers, MD  clonazePAM (KLONOPIN) 1 MG tablet TAKE 1 TABLET BY MOUTH TWICE DAILY AS NEEDED FOR ANXIETY 12/18/17   Martinique, Betty G, MD  cyanocobalamin 100 MCG tablet Take 100 mcg by mouth daily.    [provider]  doxycycline (VIBRA-TABS) 100 MG tablet Take 1 tablet (100 mg total) by mouth 2 (two) times daily for 7 days. 03/01/18 03/08/18  Martinique, Betty G, MD  Dulaglutide (TRULICITY) 9.44 HQ/7.5FF SOPN INJECT 0.75MG INTO THE SKIN ONCE A WEEK 01/25/18   Martinique, Betty G, MD  gabapentin (NEURONTIN) 400 MG capsule Take 2 capsules (800 mg total) by mouth at bedtime. 09/08/17   Martinique, Betty G, MD  hydrochlorothiazide (HYDRODIURIL) 25 MG tablet TAKE 1 TABLET BY MOUTH EVERY DAY 10/19/17   Martinique, Betty G, MD  HYDROcodone-acetaminophen (NORCO/VICODIN) 5-325 MG tablet Take 1 tablet by mouth every 6 (six) hours as needed for moderate pain. 01/04/18   Meredith Staggers, MD  hydrOXYzine (ATARAX/VISTARIL) 25 MG tablet  01/02/18   [provider]  ipratropium (ATROVENT) 0.06 % nasal spray Place 2 sprays into both nostrils 4 (four) times daily. 10/16/17   Martinique, Betty G, MD  multivitamin-lutein [facility]) CAPS capsule Take 1 capsule by mouth daily.    [provider]  naproxen (NAPROSYN) 500 MG tablet  02/27/18   [provider]  omeprazole (PRILOSEC) 40 MG capsule Take 1 capsule (40 mg total) by mouth daily. 08/12/17   Martinique, Betty G, MD  predniSONE (DELTASONE) 20 MG tablet Take 2 tablets (40 mg total) by mouth daily. 03/04/18   Noemi Chapel, MD    Family History Family  History  Problem Relation Age of Onset  . Diabetes Mother   . Alzheimer's disease Mother   . Lung cancer Father   . Esophageal cancer Brother   . Colon cancer Neg Hx     Social History Social History   Tobacco Use  . Smoking status: Never Smoker  . Smokeless tobacco: Never Used  Substance Use Topics  . Alcohol use: No  . Drug use: No     Allergies   Dilaudid [hydromorphone]; Morphine and related; and Penicillins   Review of Systems Review of Systems  All other systems reviewed and are negative.    Physical Exam Updated Vital Signs BP (!) 143/73 (BP Location: Right Arm)   Pulse 91   Temp 98.1 F (  36.7 C) (Oral)   Resp 20   Ht 1.727 m (5' 8" )   Wt 90 kg   SpO2 99%   BMI 30.17 kg/m   Physical Exam  Constitutional: She appears well-developed and well-nourished.  HENT:  Head: Normocephalic and atraumatic.  Mouth/Throat: Oropharynx is clear and moist. No oropharyngeal exudate.  There is clear rhinorrhea bilaterally, copious amounts.  Oropharynx is mildly erythematous but no exudate asymmetry or hypertrophy of the tonsils, mucous membranes are moist  Eyes: Pupils are equal, round, and reactive to light. Conjunctivae and EOM are normal. Right eye exhibits no discharge. Left eye exhibits no discharge. No scleral icterus.  Conjunctive are clear, pupils are equal round and reactive to light, no periorbital erythema  Neck: Normal range of motion. Neck supple. No JVD present. No thyromegaly present.  Very supple neck with no lymphadenopathy  Cardiovascular: Regular rhythm, normal heart sounds and intact distal pulses. Exam reveals no gallop and no friction rub.  No murmur heard. Mild tachycardia at 105 bpm, normal pulses at the radial arteries  Pulmonary/Chest: Effort normal and breath sounds normal. No respiratory distress. She has no wheezes. She has no rales.  The patient has frequent coughing spells when trying to breathe however her lung sounds are clear without  rales rhonchi or wheezing.  She is not in respiratory distress but is clearly bothered by her frequent coughing fits.  Her phonation is normal without stridor  Abdominal: Soft. Bowel sounds are normal. She exhibits no distension and no mass. There is no tenderness.  Nontender  Musculoskeletal: Normal range of motion. She exhibits no edema or tenderness.  No edema  Lymphadenopathy:    She has no cervical adenopathy.  Neurological: She is alert. Coordination normal.  The patient is able to speak in full sentences at times though at times it is broken up by coughing, she has clear speech, normal coordination and memory, follows commands without difficulty  Skin: Skin is warm and dry. No rash noted. No erythema.  No visible rashes to the skin  Psychiatric: She has a normal mood and affect. Her behavior is normal.  Nursing note and vitals reviewed.    ED Treatments / Results  Labs (all labs ordered are listed, but only abnormal results are displayed) Labs Reviewed  COMPREHENSIVE METABOLIC PANEL - Abnormal; Notable for the following components:      Result Value   Potassium 3.3 (*)    Glucose, Bld 153 (*)    All other components within normal limits  INFLUENZA PANEL BY PCR (TYPE A & B)  CBC WITH DIFFERENTIAL/PLATELET    EKG EKG Interpretation  Date/Time:  Thursday March 04 2018 12:36:49 EST Ventricular Rate:  97 PR Interval:    QRS Duration: 92 QT Interval:  364 QTC Calculation: 463 R Axis:   -28 Text Interpretation:  Sinus rhythm Ventricular premature complex Left ventricular hypertrophy Anterior infarct, old since last tracing no significant change Confirmed by Noemi Chapel [phone]) on 03/04/2018 1:52:19 PM   Radiology Dg Chest 2 View  Result Date: 03/04/2018 CLINICAL DATA:  Cough and congestion for 11 days, history diabetes mellitus, hypertension EXAM: CHEST - 2 VIEW COMPARISON:  09/23/2017 FINDINGS: Normal heart size, mediastinal contours, and pulmonary vascularity.  Atherosclerotic calcification aorta. Numerous EKG leads project over chest. Minimal chronic interstitial prominence, stable. No acute infiltrate, pleural effusion or pneumothorax. Bones demineralized. IMPRESSION: Minimal chronic interstitial prominence without acute abnormalities. Electronically Signed   By: Lavonia Dana M.D.   On: 03/04/2018 12:53  Procedures Procedures (including critical care time)  Medications Ordered in ED Medications  benzonatate (TESSALON) capsule 200 mg (200 mg Oral Given 03/04/18 1203)  albuterol (PROVENTIL) (2.5 MG/3ML) 0.083% nebulizer solution 5 mg (5 mg Nebulization Given 03/04/18 1219)  predniSONE (DELTASONE) tablet 40 mg (40 mg Oral Given 03/04/18 1203)     Initial Impression / Assessment and Plan / ED Course  I have reviewed the triage vital signs and the nursing notes.  Pertinent labs & imaging results that were available during my care of the patient were reviewed by me and considered in my medical decision making (see chart for details).  Clinical Course as of Mar 04 1413  Thu Mar 04, 2018  1243 WBC count normal - no leukocytosis, no anemia and normal platelets.  Compared with prior CBC's, no changes over the last year    [BM]  1253 BMP shows mild decrease in K - possiblyl related to her albuterol use - shift.  Glucose 153.   [BM]  5747 I have personally viewed the xray - there is no evidence of infiltrate, pneumothorax or edema.  There is some spinal arthritis of the T spine.   [BM]    Clinical Course User Index [BM] Noemi Chapel, MD   The patient's symptoms are very consistent with having a viral illness.  She reports that they really started around the 18th or 19 November, that leaves her at about 10 days today.  She has clear lung sounds but is had progressive coughing which warrants a chest x-ray, will add lab work as well, her treatment of doxycycline and albuterol seems appropriate for bacterial type infections, will give additional  supportive care with albuterol, benzonatate, prednisone.  I did have a long discussion with the patient regarding the prednisone could potentially due to her blood sugar however given the amount of coughing that she is doing it seems a reasonable intervention for the trade off of the hyperglycemia.  She was agreeable to this and understands the risks of taking this medication.  The patient ambulated with no hypoxia, she has a normal respiratory rate, she is not tachycardic and is not febrile, she improved with medications given, x-ray negative, stable for discharge, the patient expressed understanding to the indications for return.  Final Clinical Impressions(s) / ED Diagnoses   Final diagnoses:  Acute bronchitis, unspecified organism    ED Discharge Orders         Ordered    benzonatate (TESSALON) 200 MG capsule  3 times daily PRN     03/04/18 1412    predniSONE (DELTASONE) 20 MG tablet  Daily     03/04/18 1412           Noemi Chapel, MD 03/04/18 1414

## 2018-03-06 ENCOUNTER — Encounter: Payer: Self-pay | Admitting: Family Medicine

## 2018-03-09 ENCOUNTER — Other Ambulatory Visit: Payer: Self-pay

## 2018-03-09 ENCOUNTER — Emergency Department (HOSPITAL_COMMUNITY)
Admission: EM | Admit: 2018-03-09 | Discharge: 2018-03-09 | Disposition: A | Discharge status: Home / Self Care | Payer: Medicare HMO | Attending: Emergency Medicine | Admitting: Emergency Medicine

## 2018-03-09 ENCOUNTER — Encounter (HOSPITAL_COMMUNITY): Payer: Self-pay | Admitting: Emergency Medicine

## 2018-03-09 DIAGNOSIS — Z5321 Procedure and treatment not carried out due to patient leaving prior to being seen by health care provider: Secondary | ICD-10-CM | POA: Diagnosis not present

## 2018-03-09 DIAGNOSIS — R1110 Vomiting, unspecified: Secondary | ICD-10-CM | POA: Insufficient documentation

## 2018-03-09 NOTE — ED Notes (Signed)
Per registration pt left the facility 

## 2018-03-09 NOTE — ED Triage Notes (Signed)
Pt c/o one episode of vomiting with dizziness. Pt c/o nausea at this time.

## 2018-03-10 ENCOUNTER — Encounter: Payer: Medicare HMO | Attending: Physical Medicine & Rehabilitation | Admitting: Physical Medicine & Rehabilitation

## 2018-03-10 ENCOUNTER — Encounter: Payer: Self-pay | Admitting: Physical Medicine & Rehabilitation

## 2018-03-10 ENCOUNTER — Other Ambulatory Visit: Payer: Self-pay

## 2018-03-10 VITALS — BP 124/81 | HR 80 | Ht 68.0 in | Wt 197.0 lb

## 2018-03-10 DIAGNOSIS — I10 Essential (primary) hypertension: Secondary | ICD-10-CM | POA: Insufficient documentation

## 2018-03-10 DIAGNOSIS — M7918 Myalgia, other site: Secondary | ICD-10-CM

## 2018-03-10 DIAGNOSIS — M5030 Other cervical disc degeneration, unspecified cervical region: Secondary | ICD-10-CM | POA: Diagnosis not present

## 2018-03-10 DIAGNOSIS — T1490XA Injury, unspecified, initial encounter: Secondary | ICD-10-CM | POA: Insufficient documentation

## 2018-03-10 DIAGNOSIS — G894 Chronic pain syndrome: Secondary | ICD-10-CM | POA: Diagnosis not present

## 2018-03-10 DIAGNOSIS — E119 Type 2 diabetes mellitus without complications: Secondary | ICD-10-CM | POA: Insufficient documentation

## 2018-03-10 DIAGNOSIS — M542 Cervicalgia: Secondary | ICD-10-CM | POA: Diagnosis not present

## 2018-03-10 DIAGNOSIS — M25512 Pain in left shoulder: Secondary | ICD-10-CM | POA: Diagnosis not present

## 2018-03-10 DIAGNOSIS — M5412 Radiculopathy, cervical region: Secondary | ICD-10-CM | POA: Diagnosis not present

## 2018-03-10 DIAGNOSIS — M545 Low back pain: Secondary | ICD-10-CM | POA: Insufficient documentation

## 2018-03-10 DIAGNOSIS — G2581 Restless legs syndrome: Secondary | ICD-10-CM | POA: Insufficient documentation

## 2018-03-10 DIAGNOSIS — G8929 Other chronic pain: Secondary | ICD-10-CM | POA: Insufficient documentation

## 2018-03-10 DIAGNOSIS — E785 Hyperlipidemia, unspecified: Secondary | ICD-10-CM | POA: Diagnosis not present

## 2018-03-10 DIAGNOSIS — F4310 Post-traumatic stress disorder, unspecified: Secondary | ICD-10-CM | POA: Insufficient documentation

## 2018-03-10 DIAGNOSIS — R102 Pelvic and perineal pain: Secondary | ICD-10-CM | POA: Insufficient documentation

## 2018-03-10 DIAGNOSIS — F329 Major depressive disorder, single episode, unspecified: Secondary | ICD-10-CM | POA: Insufficient documentation

## 2018-03-10 MED ORDER — HYDROCODONE-ACETAMINOPHEN 5-325 MG PO TABS: 1.0000 | ORAL_TABLET | Freq: Four times a day (QID) | ORAL | 0 refills | Status: DC | PRN
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
## 2018-03-10 NOTE — Patient Instructions (Signed)
PLEASE FEEL FREE TO CALL OUR OFFICE WITH ANY PROBLEMS OR QUESTIONS ([phone])      

## 2018-03-22 ENCOUNTER — Encounter: Payer: Self-pay | Admitting: Family Medicine

## 2018-03-22 ENCOUNTER — Ambulatory Visit (INDEPENDENT_AMBULATORY_CARE_PROVIDER_SITE_OTHER): Payer: Medicare HMO | Admitting: Family Medicine

## 2018-03-22 VITALS — BP 125/78 | HR 97 | Temp 97.7°F | Resp 12 | Ht 68.0 in | Wt 197.4 lb

## 2018-03-22 DIAGNOSIS — E1149 Type 2 diabetes mellitus with other diabetic neurological complication: Secondary | ICD-10-CM | POA: Diagnosis not present

## 2018-03-22 DIAGNOSIS — T466X5A Adverse effect of antihyperlipidemic and antiarteriosclerotic drugs, initial encounter: Secondary | ICD-10-CM

## 2018-03-22 DIAGNOSIS — I10 Essential (primary) hypertension: Secondary | ICD-10-CM | POA: Diagnosis not present

## 2018-03-22 DIAGNOSIS — M791 Myalgia, unspecified site: Secondary | ICD-10-CM

## 2018-03-22 DIAGNOSIS — F32A Depression, unspecified: Secondary | ICD-10-CM

## 2018-03-22 DIAGNOSIS — F329 Major depressive disorder, single episode, unspecified: Secondary | ICD-10-CM

## 2018-03-22 DIAGNOSIS — F419 Anxiety disorder, unspecified: Secondary | ICD-10-CM

## 2018-03-22 LAB — POCT GLYCOSYLATED HEMOGLOBIN (HGB A1C): Hemoglobin A1C: 6.9 % — AB (ref 4.0–5.6)

## 2018-03-22 MED ORDER — CLONAZEPAM 1 MG PO TABS: ORAL_TABLET | ORAL | 0 refills | Status: DC

## 2018-03-22 MED ORDER — DULAGLUTIDE 1.5 MG/0.5ML ~~LOC~~ SOAJ: 1.5000 mg | SUBCUTANEOUS | 2 refills | Status: DC

## 2018-03-22 MED ORDER — HYDROXYZINE HCL 25 MG PO TABS: 12.5000 mg | ORAL_TABLET | Freq: Every day | ORAL | 0 refills | Status: DC

## 2018-03-22 NOTE — Patient Instructions (Addendum)
A few things to remember from today's visit:   Type 2 diabetes mellitus with neurological complications ([place]) - Plan: POCT glycosylated hemoglobin (Hb A1C)  Essential hypertension  Anxiety and depression - Plan: clonazePAM (KLONOPIN) 1 MG tablet  Continue weaning off clonazepam as discussed. Continue hydroxyzine 12.5 mg daily. Today stay increased from 0.75 to 1.5 mg weekly. Continue BuSpar 5 mg at bedtime.   Please be sure medication list is accurate. If a new problem present, please set up appointment sooner than planned today.

## 2018-03-22 NOTE — Assessment & Plan Note (Signed)
BP well controlled. No changes in current management. Continue low-salt diet. She is overdue for eye exam.

## 2018-03-22 NOTE — Assessment & Plan Note (Signed)
It seems like recent changes in her medication has helped with depression and anxiety. She will continue BuSpar 5 mg at bedtime, hydroxyzine 12.5 mg daily, and she will continue weaning off clonazepam. We discussed some side effects of medications. Instructed about warning signs. Follow-up in 5 to 6 months, before if needed.

## 2018-03-22 NOTE — Assessment & Plan Note (Addendum)
HgA1C improved,today 6.9. Trulicity increased from 0.75 to 1.5 mg weekly. We reviewed some side effects of medication.  Regular exercise and healthy diet with avoidance of added sugar food intake is an important part of treatment and recommended. Annual eye exam, periodic dental and foot care recommended. F/U in 5-6 months

## 2018-03-22 NOTE — Progress Notes (Signed)
HPI:   Ms.Oluwadamilola Marline Morace is a 67 y.o. female, who is here today for 4 months follow up.   She was last seen on 03/01/2017, when she was complaining about respiratory symptoms.  Antibiotic treatment was given upon request.  Since her last visit she was evaluated in the ER due to persistent respiratory symptoms. According to patient, blood work was done. A course of prednisone were recommended as well as albuterol inhaler. She is feeling much better but still occasional cough and nasal congestion.   DM 2: Currently she is on Trulicity 2.63 mg weekly. For a couple days after injection she has some mild nausea. She denies abdominal pain, vomiting, or changes in bowel habits. She has no checked BS. Last eye exam over a year ago.  Lab Results  Component Value Date   HGBA1C 7.4 (H) 08/19/2017   Lab Results  Component Value Date   MICROALBUR <0.7 05/18/2017   Denies abdominal pain, nausea,vomiting, polydipsia,polyuria, or polyphagia. Toe numbness is stable. She has not noted ulcers or lesions on feet.  Hypertension: Currently she is on HCTZ 25 mg daily. She denies checking BP at home.  Lab Results  Component Value Date   CREATININE 0.75 03/04/2018   BUN 13 03/04/2018   NA 138 03/04/2018   K 3.3 (L) 03/04/2018   CL 101 03/04/2018   CO2 27 03/04/2018   Denies severe/frequent headache, visual changes, chest pain, dyspnea, palpitation, claudication, focal weakness, or edema.    Anxiety: Recently she was started on BuSpar 5 mg twice daily, she is taking medication at bedtime and it has helped her to sleep better. She does not think she needs clonazepam, so she started weaning medication off.  Currently on clonazepam 0.5 mg every other day. She is also taking hydroxyzine 25 mg once daily, she wonders if she can take 10 mg. She feels like since she started this regimen her anxiety is much better. She is able to deal better with less stress at home. She denies  suicidal thoughts. She has not noted major side effects with medication.   Review of Systems  Constitutional: Positive for fatigue and unexpected weight change. Negative for activity change, appetite change and fever.  HENT: Negative for mouth sores, nosebleeds and trouble swallowing.   Eyes: Negative for redness and visual disturbance.  Respiratory: Negative for cough, shortness of breath and wheezing.   Cardiovascular: Negative for chest pain, palpitations and leg swelling.  Gastrointestinal: Positive for nausea. Negative for abdominal pain and vomiting.       Negative for changes in bowel habits.  Endocrine: Negative for polydipsia, polyphagia and polyuria.  Genitourinary: Negative for decreased urine volume, dysuria and hematuria.  Musculoskeletal: Positive for arthralgias. Negative for gait problem.  Skin: Negative for rash and wound.  Neurological: Positive for numbness. Negative for syncope, weakness and headaches.  Psychiatric/Behavioral: Negative for confusion. The patient is nervous/anxious.     Current Outpatient Medications on File Prior to Visit  Medication Sig Dispense Refill  . ACCU-CHEK SOFTCLIX LANCETS lancets Use to test blood sugar 3 times daily. Dx: E11.8 100 each 12  . albuterol (PROVENTIL HFA;VENTOLIN HFA) 108 (90 Base) MCG/ACT inhaler Inhale 2 puffs into the lungs every 6 (six) hours as needed for wheezing or shortness of breath. 1 Inhaler 1  . Alcohol Swabs (B-D SINGLE USE SWABS REGULAR) PADS Use to test blood sugar 3 times daily 100 each 3  . Blood Glucose Calibration (ACCU-CHEK AVIVA) SOLN Use as directed 1  each 2  . Blood Glucose Monitoring Suppl (ACCU-CHEK AVIVA PLUS) w/Device KIT Use to check blood sugar 3 times daily 1 kit 1  . busPIRone (BUSPAR) 5 MG tablet Take 1 tablet (5 mg total) by mouth 2 (two) times daily. 60 tablet 3  . cyanocobalamin 100 MCG tablet Take 100 mcg by mouth daily.    Marland Kitchen gabapentin (NEURONTIN) 400 MG capsule Take 2 capsules (800 mg  total) by mouth at bedtime. 60 capsule 3  . glucose blood (ACCU-CHEK AVIVA) test strip Use to test blood sugar 3 times daily. Dx: 11.8 100 each 12  . hydrochlorothiazide (HYDRODIURIL) 25 MG tablet TAKE 1 TABLET BY MOUTH EVERY DAY 90 tablet 0  . HYDROcodone-acetaminophen (NORCO/VICODIN) 5-325 MG tablet Take 1 tablet by mouth every 6 (six) hours as needed for moderate pain. 100 tablet 0  . ipratropium (ATROVENT) 0.06 % nasal spray Place 2 sprays into both nostrils 4 (four) times daily. 15 mL 3  . multivitamin-lutein (OCUVITE-LUTEIN) CAPS capsule Take 1 capsule by mouth daily.    Marland Kitchen omeprazole (PRILOSEC) 40 MG capsule Take 1 capsule (40 mg total) by mouth daily. 90 capsule 3   No current facility-administered medications on file prior to visit.      Past Medical History:  Diagnosis Date  . Anxiety   . Arthritis   . Diabetes mellitus without complication ([place])   . Hyperlipidemia   . Hypertension   . PTSD (post-traumatic stress disorder)    Allergies  Allergen Reactions  . Dilaudid [Hydromorphone] Nausea And Vomiting and Other (See Comments)    Made the patient feel "spaced out," also  . Morphine And Related Itching  . Penicillins Rash    Has patient had a PCN reaction causing immediate rash, facial/tongue/throat swelling, SOB or lightheadedness with hypotension: Yes Has patient had a PCN reaction causing severe rash involving mucus membranes or skin necrosis: No Has patient had a PCN reaction that required hospitalization: No Has patient had a PCN reaction occurring within the last 10 years: No If all of the above answers are "NO", then may proceed with Cephalosporin use.     Social History   Socioeconomic History  . Marital status: Divorced    Spouse name: Not on file  . Number of children: Not on file  . Years of education: Not on file  . Highest education level: Not on file  Occupational History  . Not on file  Social Needs  . Financial resource strain: Not on file  .  Food insecurity:    Worry: Not on file    Inability: Not on file  . Transportation needs:    Medical: Not on file    Non-medical: Not on file  Tobacco Use  . Smoking status: Never Smoker  . Smokeless tobacco: Never Used  Substance and Sexual Activity  . Alcohol use: No  . Drug use: No  . Sexual activity: Not on file  Lifestyle  . Physical activity:    Days per week: Not on file    Minutes per session: Not on file  . Stress: Not on file  Relationships  . Social connections:    Talks on phone: Not on file    Gets together: Not on file    Attends religious service: Not on file    Active member of club or organization: Not on file    Attends meetings of clubs or organizations: Not on file    Relationship status: Not on file  Other Topics Concern  .  Not on file  Social History Narrative  . Not on file    Vitals:   03/22/18 1450  BP: 125/78  Pulse: 97  Resp: 12  Temp: 97.7 F (36.5 C)  SpO2: 98%   Body mass index is 30.01 kg/m.   Physical Exam  Nursing note and vitals reviewed. Constitutional: She is oriented to person, place, and time. She appears well-developed. No distress.  HENT:  Head: Normocephalic and atraumatic.  Mouth/Throat: Oropharynx is clear and moist and mucous membranes are normal.  Eyes: Pupils are equal, round, and reactive to light. Conjunctivae are normal.  Cardiovascular: Normal rate and regular rhythm.  No murmur heard. Pulses:      Dorsalis pedis pulses are 2+ on the right side and 2+ on the left side.  Respiratory: Effort normal and breath sounds normal. No respiratory distress.  GI: Soft. She exhibits no mass. There is no hepatomegaly. There is no abdominal tenderness.  Musculoskeletal:        General: No edema.  Lymphadenopathy:    She has no cervical adenopathy.  Neurological: She is alert and oriented to person, place, and time. She has normal strength. No cranial nerve deficit. Gait normal.  Skin: Skin is warm. No rash noted. No  erythema.  Psychiatric: She has a normal mood and affect.  Well groomed, good eye contact.     ASSESSMENT AND PLAN:   Ms. Artelia Game Munley was seen today for 6 months follow-up.  Orders Placed This Encounter  Procedures  . POCT glycosylated hemoglobin (Hb A1C)    Lab Results  Component Value Date   HGBA1C 6.9 (A) 03/22/2018    Type 2 diabetes mellitus with neurological complications ([place]) SUP1S improved,today 6.9. Trulicity increased from 0.75 to 1.5 mg weekly. We reviewed some side effects of medication.  Regular exercise and healthy diet with avoidance of added sugar food intake is an important part of treatment and recommended. Annual eye exam, periodic dental and foot care recommended. F/U in 5-6 months    Essential hypertension BP well controlled. No changes in current management. Continue low-salt diet. She is overdue for eye exam.  Anxiety and depression It seems like recent changes in her medication has helped with depression and anxiety. She will continue BuSpar 5 mg at bedtime, hydroxyzine 12.5 mg daily, and she will continue weaning off clonazepam. We discussed some side effects of medications. Instructed about warning signs. Follow-up in 5 to 6 months, before if needed.  Myalgia due to statin Cramps and myalgias resolved after discontinuing Crestor.  She is not interested in trying a different medication.     Ailed Defibaugh G. Martinique, MD  [facility] [place] office.

## 2018-03-22 NOTE — Assessment & Plan Note (Signed)
Cramps and myalgias resolved after discontinuing Crestor.  She is not interested in trying a different medication.

## 2018-04-19 ENCOUNTER — Encounter: Payer: Self-pay | Admitting: Family Medicine

## 2018-04-21 ENCOUNTER — Other Ambulatory Visit: Payer: Self-pay | Admitting: Family Medicine

## 2018-04-21 MED ORDER — TRAZODONE HCL 50 MG PO TABS: 25.0000 mg | ORAL_TABLET | Freq: Every evening | ORAL | 1 refills | Status: DC | PRN

## 2018-04-25 IMAGING — CT CT TEMPORAL BONES W/O CM
3 of 7 series · 14 of 40 positions shown, 16 images · non-contrast
Comparison: Head CT without contrast 06/16/2014.

CLINICAL DATA: 65-year-old female who was taking antibiotics for
ear infection, but stopped due to progressed dizziness and vomiting.
Otitis externa, mastoiditis.

EXAM:
CT TEMPORAL BONES WITHOUT CONTRAST
TECHNIQUE: Axial and coronal plane CT imaging of the petrous temporal bones was
performed with thin-collimation image reconstruction. No intravenous
contrast was administered. Multiplanar CT image reconstructions were
also generated.

[Series 4: bone windows · axial · 0.32mm/px · z∈[-129,-63]mm · 6 of 154 slices shown, 8 images]
[im 22/154  brain]
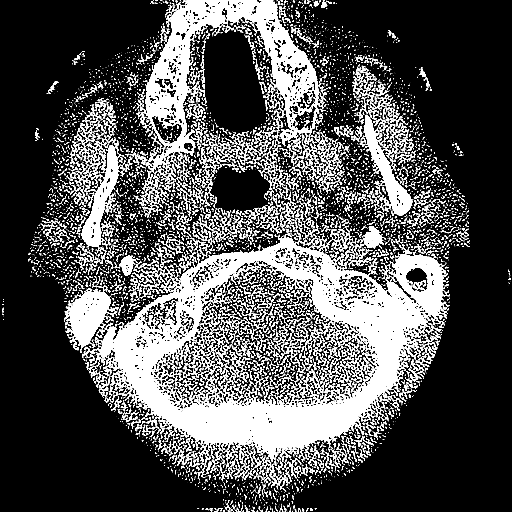
[im 22/154  bone]
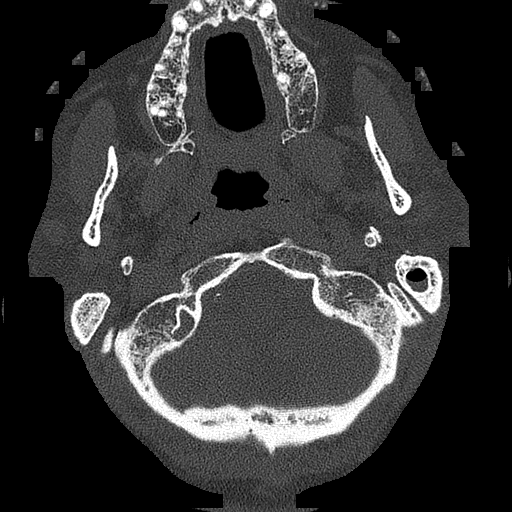
[im 44/154  bone]
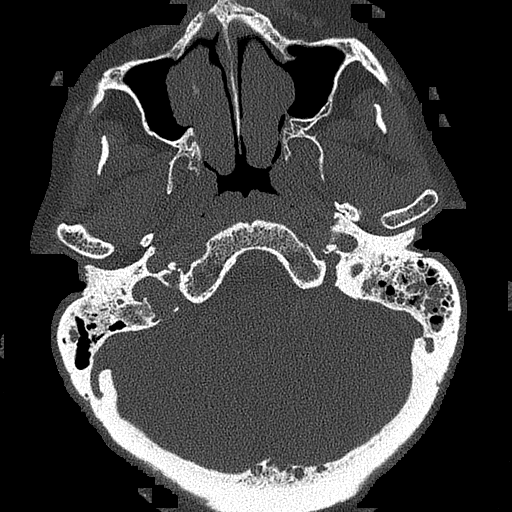
[im 66/154  bone]
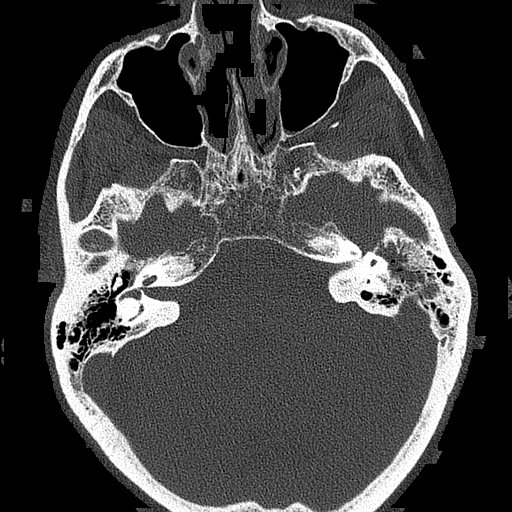
[im 88/154  bone]
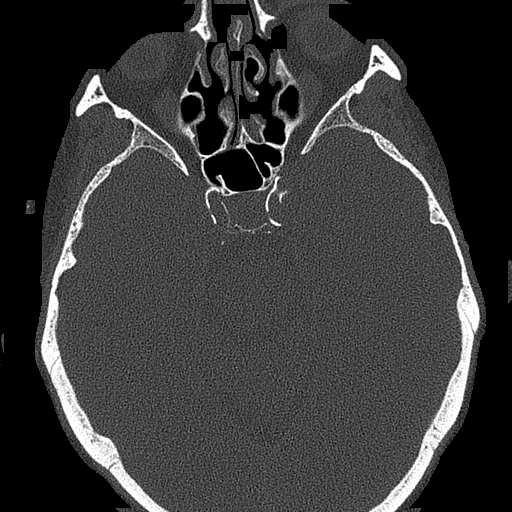
[im 110/154  brain]
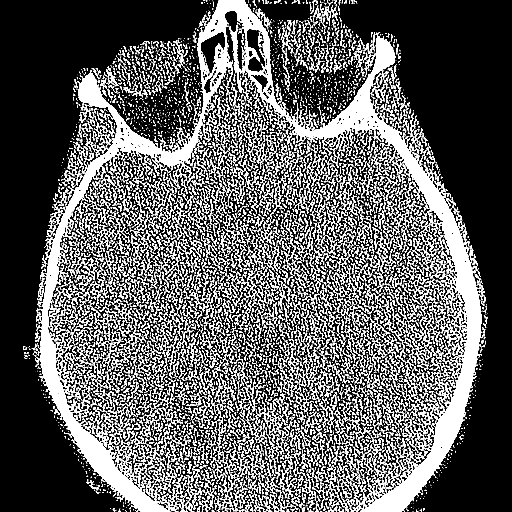
[im 110/154  bone]
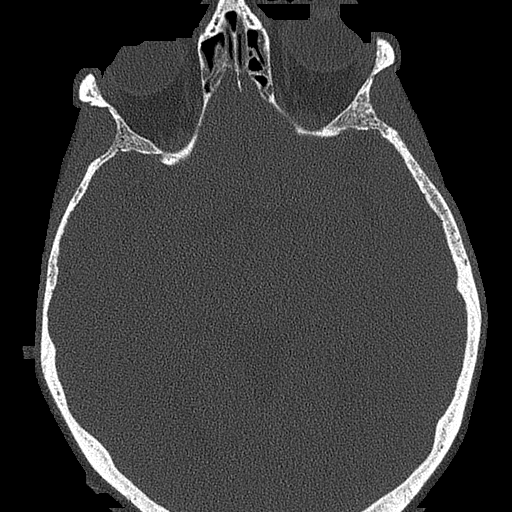
[im 132/154  bone]
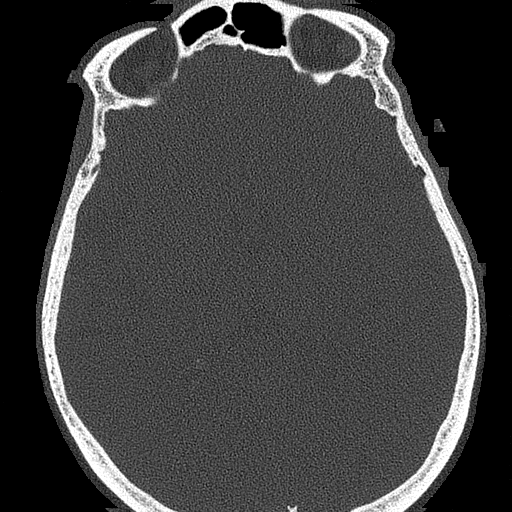

[Series 7: ax axial mag right · axial · 0.20mm/px · z∈[-131,-81]mm · 5 of 145 slices shown]
[im 21/145  bone]
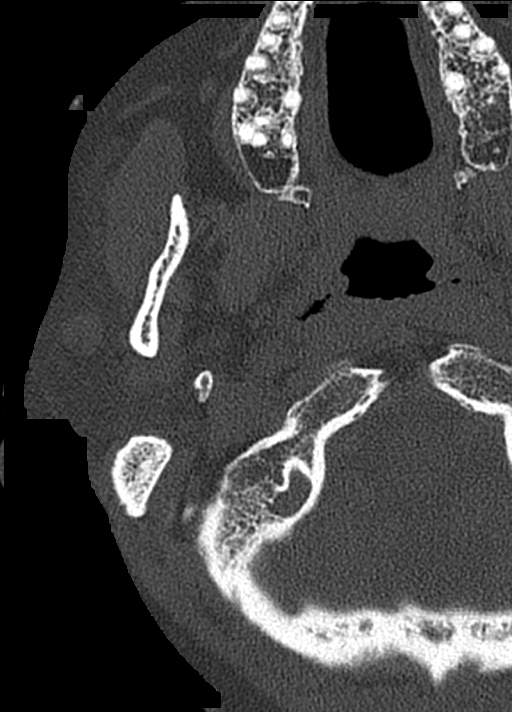
[im 42/145  bone]
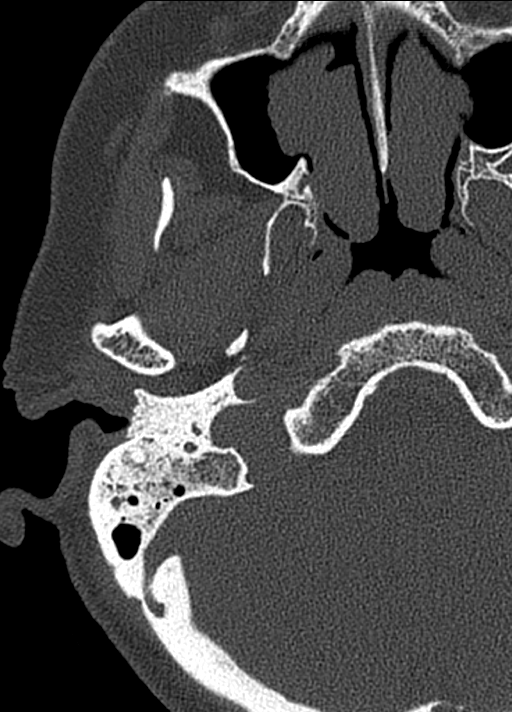
[im 62/145  bone]
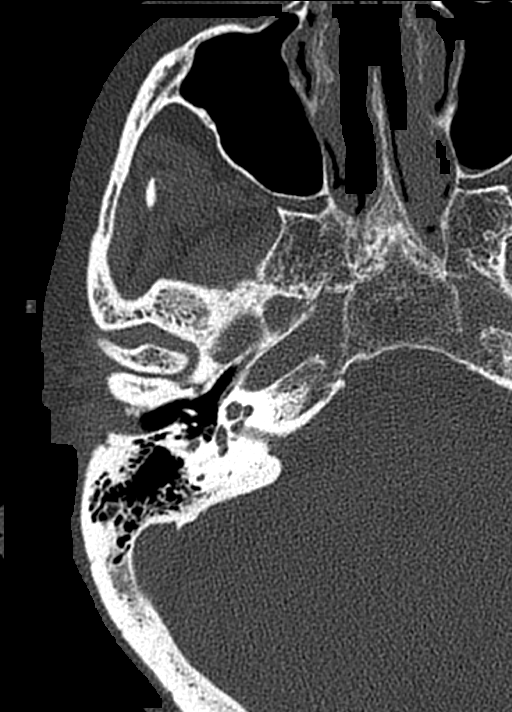
[im 83/145  bone]
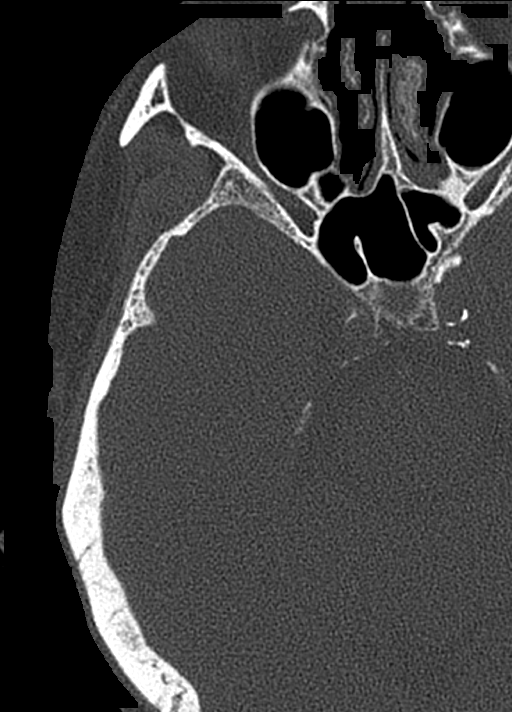
[im 103/145  bone]
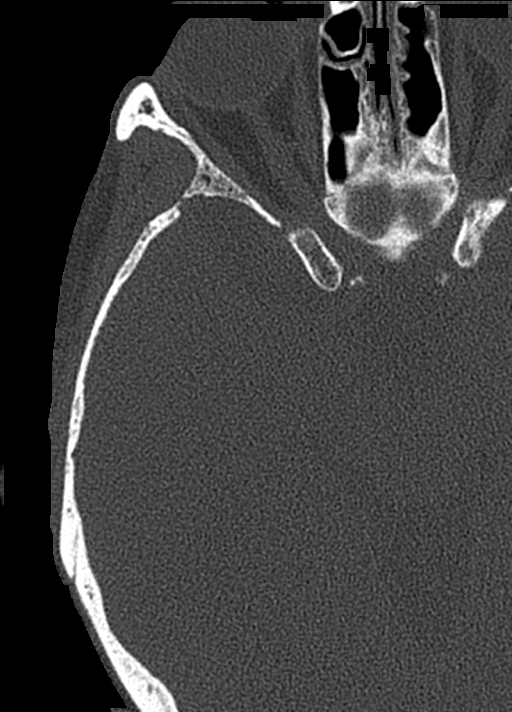

[Series 9: coronal mag right · coronal · 0.19mm/px · 3 of 187 slices shown]
[im 47/187  bone]
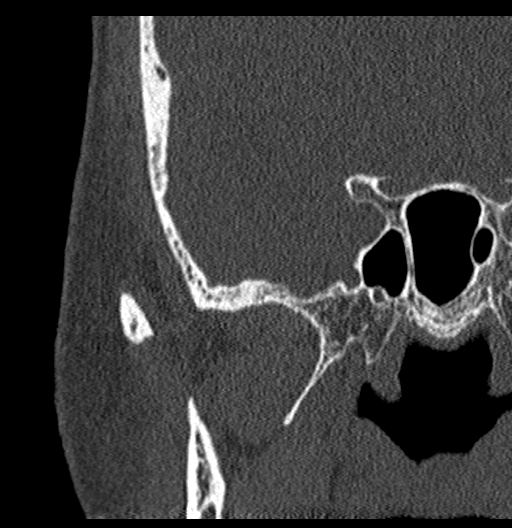
[im 94/187  bone]
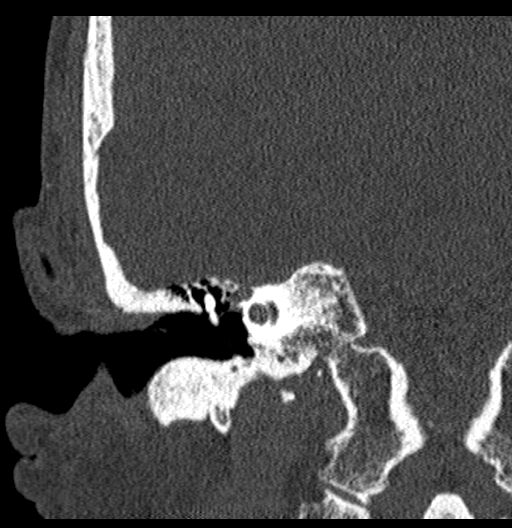
[im 140/187  bone]
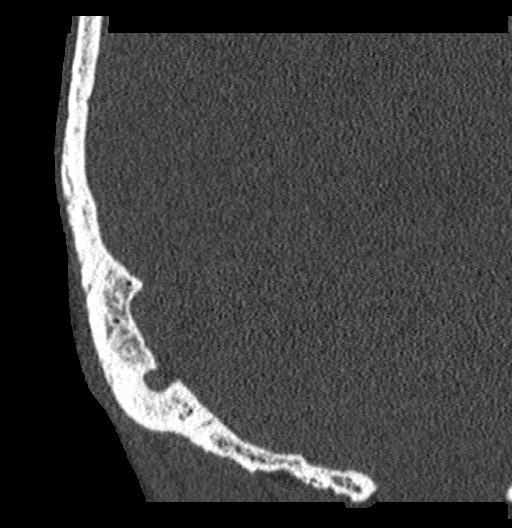

[14 of 40 positions shown; findings below may reference images not displayed]

FINDINGS: Stable and negative visualized noncontrast brain parenchyma.
Visualized orbits and scalp soft tissues are within normal limits.

Negative visualized noncontrast deep soft tissue spaces of the face.
In the superficial lobe of the right parotid gland there is an 8 mm
rounded soft tissue nodule which measured 5 mm in 1612. Negative
right parotid space otherwise. Normal CT appearance of the right
stylomastoid foramen.

In general skullbase mineralization is within normal limits.
Calcified atherosclerosis at the skull base.

The paranasal sinuses are well pneumatized, with probable
postoperative changes to the the bilateral maxillary and ethmoid
sinuses. There is symmetric appearing nasal cavity mucosal
thickening (series 6, image 15) with protrusion of the inferior
terminates through medial maxillary wall defects.

Right temporal bone:

Mild debris in the right external auditory canal. The right tympanic
membrane, tympanic cavity, and ossicles. The right mastoids are well
pneumatized. Right IAC, cochlea, vestibule and vestibular aqueduct
are within normal limits. Mildly high riding right IJ bulb (normal
variant series 7, image 95). Semicircular canals and course of the
right seventh nerve are within normal limits.

Left temporal bone:

Diffuse fluid and/or soft tissue thickening throughout the left
external auditory canal which is largely opacified (series 8, image
96). The left tympanic membrane is not evident. There is
opacification throughout the left tympanic cavity including
surrounding the left ossicles. Still, the left scutum and ossicles
appear to remain intact. The left mastoid antrum is opacified along
with the majority of the left mastoid air cells. Air cell fluid
levels are noted. No mastoid coalescence is identified.

The left IAC, cochlea, vestibule and vestibular aqueduct are within
normal limits. Left semicircular canals are within normal limits.

The left pinna does not appear enlarged or swollen.
IMPRESSION: 1. Fluid/opacification throughout the left EAC, left tympanic
cavity, and mastoids. However, no associated bony erosion, mastoid
coalescence, or other complicating features are identified.
The appearance is compatible with combined Acute Otitis Media and
Otitis Externa with mastoid effusion.
Repeat temporal bone CT (per for bleed with IV contrast) recommended
if the patient does not improve as expected
2. Normal right temporal bone.
3. Symmetric appearing nasal cavity mucosal thickening raising the
possibility of rhinitis. Well pneumatized visible paranasal sinuses,
probably with prior maxillary and ethmoid sinus surgery.
4. Small right parotid space nodule, probably a small benign
salivary gland neoplasm.

## 2018-04-29 DIAGNOSIS — M79662 Pain in left lower leg: Secondary | ICD-10-CM | POA: Diagnosis not present

## 2018-04-29 DIAGNOSIS — F419 Anxiety disorder, unspecified: Secondary | ICD-10-CM | POA: Diagnosis not present

## 2018-04-29 DIAGNOSIS — E119 Type 2 diabetes mellitus without complications: Secondary | ICD-10-CM | POA: Diagnosis not present

## 2018-04-29 DIAGNOSIS — L539 Erythematous condition, unspecified: Secondary | ICD-10-CM | POA: Diagnosis not present

## 2018-04-29 DIAGNOSIS — I8002 Phlebitis and thrombophlebitis of superficial vessels of left lower extremity: Secondary | ICD-10-CM | POA: Diagnosis not present

## 2018-04-29 DIAGNOSIS — Z880 Allergy status to penicillin: Secondary | ICD-10-CM | POA: Diagnosis not present

## 2018-04-29 DIAGNOSIS — I82812 Embolism and thrombosis of superficial veins of left lower extremities: Secondary | ICD-10-CM | POA: Diagnosis not present

## 2018-04-29 DIAGNOSIS — Z79899 Other long term (current) drug therapy: Secondary | ICD-10-CM | POA: Diagnosis not present

## 2018-05-02 ENCOUNTER — Encounter: Payer: Self-pay | Admitting: Family Medicine

## 2018-05-05 ENCOUNTER — Telehealth: Payer: Self-pay | Admitting: Family Medicine

## 2018-05-05 ENCOUNTER — Encounter: Payer: Self-pay | Admitting: Family Medicine

## 2018-05-05 ENCOUNTER — Ambulatory Visit (INDEPENDENT_AMBULATORY_CARE_PROVIDER_SITE_OTHER): Payer: Medicare HMO | Admitting: Family Medicine

## 2018-05-05 VITALS — BP 126/80 | HR 97 | Temp 97.9°F | Resp 12 | Ht 68.0 in

## 2018-05-05 DIAGNOSIS — I8002 Phlebitis and thrombophlebitis of superficial vessels of left lower extremity: Secondary | ICD-10-CM | POA: Diagnosis not present

## 2018-05-05 DIAGNOSIS — G4700 Insomnia, unspecified: Secondary | ICD-10-CM | POA: Diagnosis not present

## 2018-05-05 DIAGNOSIS — F329 Major depressive disorder, single episode, unspecified: Secondary | ICD-10-CM | POA: Diagnosis not present

## 2018-05-05 DIAGNOSIS — E1149 Type 2 diabetes mellitus with other diabetic neurological complication: Secondary | ICD-10-CM

## 2018-05-05 DIAGNOSIS — F419 Anxiety disorder, unspecified: Secondary | ICD-10-CM | POA: Diagnosis not present

## 2018-05-05 DIAGNOSIS — F32A Depression, unspecified: Secondary | ICD-10-CM

## 2018-05-05 MED ORDER — DOXEPIN HCL 10 MG PO CAPS: 10.0000 mg | ORAL_CAPSULE | Freq: Every day | ORAL | 1 refills | Status: DC

## 2018-05-05 MED ORDER — CLONAZEPAM 1 MG PO TABS: ORAL_TABLET | ORAL | 3 refills | Status: DC

## 2018-05-05 NOTE — Progress Notes (Signed)
HPI:   [name] is a 68 y.o. female, who is here today to follow on recent ER visit and with some concerns. She wants to be listened today,sh tells me that she needs sometime,not to rush.She needs to "take some things from my chest."   Since her last OV she has sent me a couple messages c/o nausea caused by Trulicity. She thinks "1.75 mg is a big jump" from dose she was taking previously. She was started on Trulicity a few months ago because she could not longer afford [place].  She was started on Trulicity 0.93 mg and then recommended increasing dose to 1.5 mg. Denies abdominal pain, nausea,vomiting, polydipsia,polyuria, or polyphagia.  Here in the office and after reviewing med lists and doses she realizes    she was mistaking her meds. She states that she is not having problems with Trulicity and that dose she is taking is in fact 1.5 mg weekly.   Lab Results  Component Value Date   HGBA1C 6.9 (A) 03/22/2018   Lab Results  Component Value Date   MICROALBUR <0.7 05/18/2017   She does not feel like it wasTrulicity the one causing nausea.  Insomnia: She is not sleeping at all for the past few days. She tried Trazodone x 4 days. She felt nausea,drawsy,"bad. She "will not take" this medication again. She wants something that helps her with sleep.  Denies Hx of bipolar disorder.   Anxiety: A few times she has tried to wean off Clonazepam but she would like to continue medication. She feels like she is taking a "small dose", Clonopin is "not touching" anxiety,it used to help more with anxiety when she first started. She wonders if she is "inmmune" to medication. She was taking 1 mg tid in the past. She denies any side effects with medication,states that she can function,it does not make her drowsy,. She is also following with pain management. She is on Hydrocodone-Acetaminophen, she does not understand the concern about possible side effects during the  day,states that she can take Hydrocodone and then drive,it does not affect her capacity of function,drive.  Depression, chronic. She goes through all the loses she has had and have contributed to her depression. She had to place her mother in a NH, sons died months apart (murdered and drug overdose),sister overdose, and brothercommitted suicide. She feels like she has recovered from all these events. She has seen psychiatrist and psychologists years ago. She was on Cymbalta,bid not help with pain or mood.  -She was last seen in the ER on 04/29/18 because LLE pain and erythema. 04/29/18 LE venous Duplex:Right  There is no evidence of obstruction proximal to the inguinal ligament or in the common femoral vein.  Left There is evidence of acute superficial thrombosis of the small saphenous vein. A cystic structure is found in the popliteal fossa.  Symptoms are greatly improved.  Review of Systems  Constitutional: Positive for activity change and fatigue. Negative for appetite change and fever.  HENT: Negative for mouth sores, nosebleeds and trouble swallowing.   Eyes: Negative for redness and visual disturbance.  Respiratory: Negative for cough, shortness of breath and wheezing.   Cardiovascular: Negative for chest pain, palpitations and leg swelling.  Gastrointestinal: Negative for abdominal pain, nausea and vomiting.       Negative for changes in bowel habits.  Endocrine: Negative for polydipsia, polyphagia and polyuria.  Genitourinary: Negative for decreased urine volume and hematuria.  Musculoskeletal: Positive for arthralgias and  myalgias.  Skin: Negative for rash and wound.  Neurological: Negative for syncope, weakness and headaches.  Psychiatric/Behavioral: Positive for sleep disturbance. Negative for confusion and suicidal ideas. The patient is nervous/anxious.     Current Outpatient Medications on File Prior to Visit  Medication Sig Dispense Refill  . ACCU-CHEK SOFTCLIX  LANCETS lancets Use to test blood sugar 3 times daily. Dx: E11.8 100 each 12  . Alcohol Swabs (B-D SINGLE USE SWABS REGULAR) PADS Use to test blood sugar 3 times daily 100 each 3  . Blood Glucose Calibration (ACCU-CHEK AVIVA) SOLN Use as directed 1 each 2  . Blood Glucose Monitoring Suppl (ACCU-CHEK AVIVA PLUS) w/Device KIT Use to check blood sugar 3 times daily 1 kit 1  . Dulaglutide (TRULICITY) 1.5 DD/2.2GU SOPN Inject 1.5 mg into the skin once a week. 13 pen 2  . gabapentin (NEURONTIN) 400 MG capsule Take 2 capsules (800 mg total) by mouth at bedtime. 60 capsule 3  . glucose blood (ACCU-CHEK AVIVA) test strip Use to test blood sugar 3 times daily. Dx: 11.8 100 each 12  . hydrochlorothiazide (HYDRODIURIL) 25 MG tablet TAKE 1 TABLET BY MOUTH EVERY DAY 90 tablet 0  . HYDROcodone-acetaminophen (NORCO/VICODIN) 5-325 MG tablet Take 1 tablet by mouth every 6 (six) hours as needed for moderate pain. 100 tablet 0  . multivitamin-lutein (OCUVITE-LUTEIN) CAPS capsule Take 1 capsule by mouth daily.    Marland Kitchen omeprazole (PRILOSEC) 40 MG capsule Take 1 capsule (40 mg total) by mouth daily. 90 capsule 3  . albuterol (PROVENTIL HFA;VENTOLIN HFA) 108 (90 Base) MCG/ACT inhaler Inhale 2 puffs into the lungs every 6 (six) hours as needed for wheezing or shortness of breath. (Patient not taking: Reported on 05/05/2018) 1 Inhaler 1  . busPIRone (BUSPAR) 5 MG tablet Take 1 tablet (5 mg total) by mouth 2 (two) times daily. (Patient not taking: Reported on 05/05/2018) 60 tablet 3  . cyanocobalamin 100 MCG tablet Take 100 mcg by mouth daily.    Marland Kitchen ipratropium (ATROVENT) 0.06 % nasal spray Place 2 sprays into both nostrils 4 (four) times daily. (Patient not taking: Reported on 05/05/2018) 15 mL 3   No current facility-administered medications on file prior to visit.      Past Medical History:  Diagnosis Date  . Anxiety   . Arthritis   . Diabetes mellitus without complication ([place])   . Hyperlipidemia   . Hypertension   .  PTSD (post-traumatic stress disorder)    Allergies  Allergen Reactions  . Dilaudid [Hydromorphone] Nausea And Vomiting and Other (See Comments)    Made the patient feel "spaced out," also  . Morphine And Related Itching  . Penicillins Rash    Has patient had a PCN reaction causing immediate rash, facial/tongue/throat swelling, SOB or lightheadedness with hypotension: Yes Has patient had a PCN reaction causing severe rash involving mucus membranes or skin necrosis: No Has patient had a PCN reaction that required hospitalization: No Has patient had a PCN reaction occurring within the last 10 years: No If all of the above answers are "NO", then may proceed with Cephalosporin use.     Social History   Socioeconomic History  . Marital status: Divorced    Spouse name: Not on file  . Number of children: Not on file  . Years of education: Not on file  . Highest education level: Not on file  Occupational History  . Not on file  Social Needs  . Financial resource strain: Not on file  . Food  insecurity:    Worry: Not on file    Inability: Not on file  . Transportation needs:    Medical: Not on file    Non-medical: Not on file  Tobacco Use  . Smoking status: Never Smoker  . Smokeless tobacco: Never Used  Substance and Sexual Activity  . Alcohol use: No  . Drug use: No  . Sexual activity: Not on file  Lifestyle  . Physical activity:    Days per week: Not on file    Minutes per session: Not on file  . Stress: Not on file  Relationships  . Social connections:    Talks on phone: Not on file    Gets together: Not on file    Attends religious service: Not on file    Active member of club or organization: Not on file    Attends meetings of clubs or organizations: Not on file    Relationship status: Not on file  Other Topics Concern  . Not on file  Social History Narrative  . Not on file    Vitals:   05/05/18 1425  BP: 126/80  Pulse: 97  Resp: 12  Temp: 97.9 F (36.6 C)    SpO2: 99%   Body mass index is 30.01 kg/m.   Physical Exam  Nursing note and vitals reviewed. Constitutional: She is oriented to person, place, and time. She appears well-developed. No distress.  HENT:  Head: Normocephalic and atraumatic.  Mouth/Throat: Oropharynx is clear and moist and mucous membranes are normal.  Eyes: Pupils are equal, round, and reactive to light. Conjunctivae are normal.  Cardiovascular: Normal rate and regular rhythm.  No murmur heard. Pulses:      Dorsalis pedis pulses are 2+ on the right side and 2+ on the left side.  Cord like structure palpated along left calf,not tender,no erythema or edema on area.  Respiratory: Effort normal and breath sounds normal. No respiratory distress.  GI: Soft. She exhibits no mass. There is no hepatomegaly. There is no abdominal tenderness.  Musculoskeletal:        General: No edema.  Lymphadenopathy:    She has no cervical adenopathy.  Neurological: She is alert and oriented to person, place, and time. She has normal strength. No cranial nerve deficit. Gait normal.  Skin: Skin is warm. No rash noted. No erythema.  Psychiatric: Her mood appears anxious. Her affect is labile. She exhibits a depressed mood. She expresses no suicidal ideation.  Well groomed, good eye contact.     ASSESSMENT AND PLAN:   Ms. Christi Wirick Feltz was seen today for follow-up.  Orders Placed This Encounter  Procedures  . Microalbumin / creatinine urine ratio   Lab Results  Component Value Date   MICROALBUR <0.7 05/18/2017    Thrombophlebitis of superficial veins of left lower extremity Recovering well. Continue ambulation and monitor for worsening pain. Instructed about warning signs.  Type 2 diabetes mellitus with neurological complications ([place]) Today we clarify questions about Trulicity.  She will continue with medication 1.5 mg weekly, which she has tolerated well. We will recheck A1c in 08/2018.  Anxiety and depression Problem  does not seem to be well controlled. Recommend considering psychiatric/psychotherapy evaluation, she is not interested in psychiatry but she will consider the latter one. She will resume clonazepam 1 mg twice daily, we discussed side effects. Instructed about warning signs. Follow-up in 08/2018, before if needed.  Insomnia Trazodone did not help. We discussed other pharmacologic options, she agrees with trying doxepin 10  mg daily. We discussed some side effects of medication. Good sleep hygiene. Follow-up in 3 months.  2:35 Pm to 3:24 Pm 40 min face to face OV. > 50% was dedicated to discussion of Dx, prognosis, treatment options, and some side effects of medications.   Return if symptoms worsen or fail to improve, for Keep next follow-up appointment.       Betty G. Martinique, MD  [facility]. [place] office.

## 2018-05-05 NOTE — Assessment & Plan Note (Signed)
Trazodone did not help. We discussed other pharmacologic options, she agrees with trying doxepin 10 mg daily. We discussed some side effects of medication. Good sleep hygiene. Follow-up in 3 months.

## 2018-05-05 NOTE — Assessment & Plan Note (Signed)
Problem does not seem to be well controlled. Recommend considering psychiatric/psychotherapy evaluation, she is not interested in psychiatry but she will consider the latter one. She will resume clonazepam 1 mg twice daily, we discussed side effects. Instructed about warning signs. Follow-up in 08/2018, before if needed.

## 2018-05-05 NOTE — Patient Instructions (Addendum)
A few things to remember from today's visit:   Type 2 diabetes mellitus with neurological complications ([place]) - Plan: Microalbumin / creatinine urine ratio  Anxiety and depression  Essential hypertension  Insomnia, unspecified type   Please be sure medication list is accurate. If a new problem present, please set up appointment sooner than planned today.

## 2018-05-05 NOTE — Telephone Encounter (Signed)
Copied from [place] [phone]. Topic: Quick Communication - Rx Refill/Question >> May 05, 2018  4:26 PM Rayann Heman wrote: Medication:doxepin (SINEQUAN) 10 MG capsule [[id number]] pt states that pharmacy is needing a PA for medication.

## 2018-05-05 NOTE — Assessment & Plan Note (Signed)
Today we clarify questions about Trulicity.  She will continue with medication 1.5 mg weekly, which she has tolerated well. We will recheck A1c in 08/2018.

## 2018-05-06 ENCOUNTER — Encounter: Payer: Self-pay | Admitting: Family Medicine

## 2018-05-06 NOTE — Telephone Encounter (Signed)
PA needed for Doxepin 10 mg capsule

## 2018-05-07 NOTE — Telephone Encounter (Signed)
Prior auth for Doxepin 10mg  sent to [email or web address] [id number].

## 2018-05-11 ENCOUNTER — Encounter: Payer: Self-pay | Admitting: Registered Nurse

## 2018-05-11 ENCOUNTER — Encounter: Payer: Medicare HMO | Attending: Physical Medicine & Rehabilitation | Admitting: Registered Nurse

## 2018-05-11 VITALS — BP 144/84 | HR 89 | Ht 68.0 in | Wt 203.0 lb

## 2018-05-11 DIAGNOSIS — M47812 Spondylosis without myelopathy or radiculopathy, cervical region: Secondary | ICD-10-CM | POA: Diagnosis not present

## 2018-05-11 DIAGNOSIS — R102 Pelvic and perineal pain: Secondary | ICD-10-CM | POA: Diagnosis not present

## 2018-05-11 DIAGNOSIS — G5793 Unspecified mononeuropathy of bilateral lower limbs: Secondary | ICD-10-CM

## 2018-05-11 DIAGNOSIS — Z5181 Encounter for therapeutic drug level monitoring: Secondary | ICD-10-CM

## 2018-05-11 DIAGNOSIS — G2581 Restless legs syndrome: Secondary | ICD-10-CM | POA: Insufficient documentation

## 2018-05-11 DIAGNOSIS — M542 Cervicalgia: Secondary | ICD-10-CM | POA: Insufficient documentation

## 2018-05-11 DIAGNOSIS — E785 Hyperlipidemia, unspecified: Secondary | ICD-10-CM | POA: Diagnosis not present

## 2018-05-11 DIAGNOSIS — I10 Essential (primary) hypertension: Secondary | ICD-10-CM | POA: Insufficient documentation

## 2018-05-11 DIAGNOSIS — M545 Low back pain, unspecified: Secondary | ICD-10-CM

## 2018-05-11 DIAGNOSIS — Z79891 Long term (current) use of opiate analgesic: Secondary | ICD-10-CM

## 2018-05-11 DIAGNOSIS — F4310 Post-traumatic stress disorder, unspecified: Secondary | ICD-10-CM | POA: Insufficient documentation

## 2018-05-11 DIAGNOSIS — G894 Chronic pain syndrome: Secondary | ICD-10-CM

## 2018-05-11 DIAGNOSIS — G8929 Other chronic pain: Secondary | ICD-10-CM | POA: Insufficient documentation

## 2018-05-11 DIAGNOSIS — M5412 Radiculopathy, cervical region: Secondary | ICD-10-CM | POA: Diagnosis not present

## 2018-05-11 DIAGNOSIS — E119 Type 2 diabetes mellitus without complications: Secondary | ICD-10-CM | POA: Diagnosis not present

## 2018-05-11 DIAGNOSIS — M5030 Other cervical disc degeneration, unspecified cervical region: Secondary | ICD-10-CM | POA: Insufficient documentation

## 2018-05-11 DIAGNOSIS — M25512 Pain in left shoulder: Secondary | ICD-10-CM | POA: Insufficient documentation

## 2018-05-11 DIAGNOSIS — F329 Major depressive disorder, single episode, unspecified: Secondary | ICD-10-CM | POA: Insufficient documentation

## 2018-05-11 DIAGNOSIS — T1490XA Injury, unspecified, initial encounter: Secondary | ICD-10-CM | POA: Diagnosis not present

## 2018-05-11 DIAGNOSIS — M7918 Myalgia, other site: Secondary | ICD-10-CM | POA: Diagnosis not present

## 2018-05-11 DIAGNOSIS — M546 Pain in thoracic spine: Secondary | ICD-10-CM

## 2018-05-11 MED ORDER — HYDROCODONE-ACETAMINOPHEN 5-325 MG PO TABS: 1.0000 | ORAL_TABLET | Freq: Four times a day (QID) | ORAL | 0 refills | Status: DC | PRN
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
## 2018-05-11 NOTE — Patient Instructions (Signed)
Increase Gabapentin: One Capsule in the Morning one capsule in the afternoon and two capsules at Bedtime.   Send me A My Chart Message on Friday 05/14/2018

## 2018-05-15 LAB — TOXASSURE SELECT,+ANTIDEPR,UR

## 2018-05-25 NOTE — Telephone Encounter (Signed)
Patient requesting advise.

## 2018-06-03 DIAGNOSIS — J3489 Other specified disorders of nose and nasal sinuses: Secondary | ICD-10-CM | POA: Diagnosis not present

## 2018-06-03 DIAGNOSIS — J342 Deviated nasal septum: Secondary | ICD-10-CM | POA: Diagnosis not present

## 2018-06-03 DIAGNOSIS — J329 Chronic sinusitis, unspecified: Secondary | ICD-10-CM | POA: Diagnosis not present

## 2018-06-03 DIAGNOSIS — R0982 Postnasal drip: Secondary | ICD-10-CM | POA: Diagnosis not present

## 2018-06-30 ENCOUNTER — Encounter: Payer: Self-pay | Admitting: Family Medicine

## 2018-06-30 ENCOUNTER — Other Ambulatory Visit: Payer: Self-pay | Admitting: *Deleted

## 2018-06-30 DIAGNOSIS — G4700 Insomnia, unspecified: Secondary | ICD-10-CM

## 2018-06-30 MED ORDER — DOXEPIN HCL 10 MG PO CAPS: 10.0000 mg | ORAL_CAPSULE | Freq: Every day | ORAL | 1 refills | Status: DC

## 2018-07-06 ENCOUNTER — Encounter: Payer: Self-pay | Admitting: Family Medicine

## 2018-07-07 ENCOUNTER — Ambulatory Visit (INDEPENDENT_AMBULATORY_CARE_PROVIDER_SITE_OTHER): Payer: Medicare HMO | Admitting: Family Medicine

## 2018-07-07 ENCOUNTER — Other Ambulatory Visit: Payer: Self-pay

## 2018-07-07 ENCOUNTER — Encounter: Payer: Self-pay | Admitting: Family Medicine

## 2018-07-07 ENCOUNTER — Ambulatory Visit: Payer: Self-pay | Admitting: Family Medicine

## 2018-07-07 VITALS — HR 80 | Resp 12

## 2018-07-07 DIAGNOSIS — R319 Hematuria, unspecified: Secondary | ICD-10-CM

## 2018-07-07 DIAGNOSIS — G4700 Insomnia, unspecified: Secondary | ICD-10-CM

## 2018-07-07 DIAGNOSIS — F32A Depression, unspecified: Secondary | ICD-10-CM

## 2018-07-07 DIAGNOSIS — R310 Gross hematuria: Secondary | ICD-10-CM | POA: Diagnosis not present

## 2018-07-07 DIAGNOSIS — F329 Major depressive disorder, single episode, unspecified: Secondary | ICD-10-CM

## 2018-07-07 DIAGNOSIS — N390 Urinary tract infection, site not specified: Secondary | ICD-10-CM | POA: Diagnosis not present

## 2018-07-07 DIAGNOSIS — F419 Anxiety disorder, unspecified: Secondary | ICD-10-CM | POA: Diagnosis not present

## 2018-07-07 MED ORDER — NITROFURANTOIN MONOHYD MACRO 100 MG PO CAPS: 100.0000 mg | ORAL_CAPSULE | Freq: Two times a day (BID) | ORAL | 0 refills | Status: DC

## 2018-07-07 NOTE — Assessment & Plan Note (Signed)
Problem is better controlled. No changes on clonazepam dose, we discussed some side effects. Instructed to keep next follow-up appointment, around 10/2018.

## 2018-07-07 NOTE — Progress Notes (Addendum)
Virtual Visit via Video Note  I connected with [name] on 07/07/18 at  2:45 PM EDT by a video enabled telemedicine application and verified that I am speaking with the correct person using two identifiers.  Location patient: home Location provider:home office Persons participating in the virtual visit: patient, provider  I discussed the limitations of evaluation and management by telemedicine and the availability of in person appointments. The patient expressed understanding and agreed to proceed.   HPI: Jeanette. [name] is a 68 yo female with Hx of depression,anxiety,DM II,and chronic pain. She is being seen today via WebEx because 2 to 3 days of urinary symptoms. Gradual onset of dysuria, urinary frequency, and urgency on 07/05/2018. Dysuria improved  x 1 day after taking Azo but worse again this morning.  She has also noted intermittent gross hematuria, blood in tissue. She states that in the past she has been evaluated by urologist due to gross hematuria. She has not noted nose/gum bleeding, blood in the stool, or easy bruising.  She denies fever, chills, unusual fatigue, worsening back pain,abdominal pain, changes in bowel habits, nausea, vomiting, vaginal discharge, or vaginal bleeding. Postmenopausal. She denies sex intercourse. She has had UTIs in the past, last Ucx grew flora that is commonly found in internal and external genitalia. In the past she has been treated with Cipro.  She recently completed 30 days of clindamycin, prescribed by ENT to treat ear infection.  According to patient, she had a right ear culture, antibiotic recommendations were given the results were reviewed.  She thinks this antibiotic could have caused urinary symptoms.   Insomnia:She is also reporting sleeping better since she started Doxepin 10 mg. She takes it 30 min before bedtime,it takes about 30-45 min to start working. Sleeping about 7-8 hours. She feels rested the next day. She is tolerating  medication well, denies side effects.  In regard to anxiety, she feels like she is doing much better. She is on clonazepam 1 mg, which she decreased from 1 tablet twice daily to 0.5 tablet twice daily. She denies depressed mood suicidal thoughts.   ROS: See pertinent positives and negatives per HPI.  Past Medical History:  Diagnosis Date  . Anxiety   . Arthritis   . Diabetes mellitus without complication ([place])   . Hyperlipidemia   . Hypertension   . PTSD (post-traumatic stress disorder)     Past Surgical History:  Procedure Laterality Date  . ACHILLES TENDON SURGERY Left   . BACK SURGERY    . BLADDER SUSPENSION    . CHOLECYSTECTOMY    . NOSE SURGERY     x 2   . OTHER SURGICAL HISTORY     Breast Duct removed  . PARTIAL HYSTERECTOMY    . RECTOCELE REPAIR    . VEIN SURGERY Right    leg    Family History  Problem Relation Age of Onset  . Diabetes Mother   . Alzheimer's disease Mother   . Lung cancer Father   . Esophageal cancer Brother   . Colon cancer Neg Hx    Social History   Socioeconomic History  . Marital status: Divorced    Spouse name: Not on file  . Number of children: Not on file  . Years of education: Not on file  . Highest education level: Not on file  Occupational History  . Not on file  Social Needs  . Financial resource strain: Not on file  . Food insecurity:    Worry: Not on file  Inability: Not on file  . Transportation needs:    Medical: Not on file    Non-medical: Not on file  Tobacco Use  . Smoking status: Never Smoker  . Smokeless tobacco: Never Used  Substance and Sexual Activity  . Alcohol use: No  . Drug use: No  . Sexual activity: Not on file  Lifestyle  . Physical activity:    Days per week: Not on file    Minutes per session: Not on file  . Stress: Not on file  Relationships  . Social connections:    Talks on phone: Not on file    Gets together: Not on file    Attends religious service: Not on file    Active member  of club or organization: Not on file    Attends meetings of clubs or organizations: Not on file    Relationship status: Not on file  . Intimate partner violence:    Fear of current or ex partner: Not on file    Emotionally abused: Not on file    Physically abused: Not on file    Forced sexual activity: Not on file  Other Topics Concern  . Not on file  Social History Narrative  . Not on file     Current Outpatient Medications:  .  ACCU-CHEK SOFTCLIX LANCETS lancets, Use to test blood sugar 3 times daily. Dx: E11.8, Disp: 100 each, Rfl: 12 .  albuterol (PROVENTIL HFA;VENTOLIN HFA) 108 (90 Base) MCG/ACT inhaler, Inhale 2 puffs into the lungs every 6 (six) hours as needed for wheezing or shortness of breath., Disp: 1 Inhaler, Rfl: 1 .  Alcohol Swabs (B-D SINGLE USE SWABS REGULAR) PADS, Use to test blood sugar 3 times daily, Disp: 100 each, Rfl: 3 .  Blood Glucose Calibration (ACCU-CHEK AVIVA) SOLN, Use as directed, Disp: 1 each, Rfl: 2 .  Blood Glucose Monitoring Suppl (ACCU-CHEK AVIVA PLUS) w/Device KIT, Use to check blood sugar 3 times daily, Disp: 1 kit, Rfl: 1 .  busPIRone (BUSPAR) 5 MG tablet, Take 1 tablet (5 mg total) by mouth 2 (two) times daily., Disp: 60 tablet, Rfl: 3 .  clonazePAM (KLONOPIN) 1 MG tablet, Continue weaning med off as discussed., Disp: 60 tablet, Rfl: 3 .  cyanocobalamin 100 MCG tablet, Take 100 mcg by mouth daily., Disp: , Rfl:  .  doxepin (SINEQUAN) 10 MG capsule, Take 1 capsule (10 mg total) by mouth at bedtime., Disp: 30 capsule, Rfl: 1 .  Dulaglutide (TRULICITY) 1.5 MG/0.5ML SOPN, Inject 1.5 mg into the skin once a week., Disp: 13 pen, Rfl: 2 .  gabapentin (NEURONTIN) 400 MG capsule, Take 2 capsules (800 mg total) by mouth at bedtime., Disp: 60 capsule, Rfl: 3 .  glucose blood (ACCU-CHEK AVIVA) test strip, Use to test blood sugar 3 times daily. Dx: 11.8, Disp: 100 each, Rfl: 12 .  hydrochlorothiazide (HYDRODIURIL) 25 MG tablet, TAKE 1 TABLET BY MOUTH EVERY DAY,  Disp: 90 tablet, Rfl: 0 .  HYDROcodone-acetaminophen (NORCO/VICODIN) 5-325 MG tablet, Take 1 tablet by mouth every 6 (six) hours as needed for moderate pain., Disp: 100 tablet, Rfl: 0 .  ipratropium (ATROVENT) 0.06 % nasal spray, Place 2 sprays into both nostrils 4 (four) times daily., Disp: 15 mL, Rfl: 3 .  multivitamin-lutein (OCUVITE-LUTEIN) CAPS capsule, Take 1 capsule by mouth daily., Disp: , Rfl:  .  nitrofurantoin, macrocrystal-monohydrate, (MACROBID) 100 MG capsule, Take 1 capsule (100 mg total) by mouth 2 (two) times daily for 5 days., Disp: 10 capsule, Rfl: 0 .    omeprazole (PRILOSEC) 40 MG capsule, Take 1 capsule (40 mg total) by mouth daily., Disp: 90 capsule, Rfl: 3  EXAM:  VITALS per patient if applicable:Pulse 80   Resp 12   GENERAL: alert, oriented, appears well and in no acute distress  HEENT: atraumatic, conjunttiva clear, no obvious abnormalities on inspection of external nose and ears  NECK: normal movements of the head and neck  LUNGS: on inspection no signs of respiratory distress, breathing rate appears normal, no obvious gross SOB, gasping or wheezing  CV: no obvious cyanosis  Jeanette: moves all visible extremities without noticeable abnormality  PSYCH/NEURO: pleasant and cooperative, no obvious depression, speech and thought processing grossly intact. Anxious.  ASSESSMENT AND PLAN:  Discussed the following assessment and plan:  Urinary tract infection with hematuria, site unspecified - Plan: nitrofurantoin, macrocrystal-monohydrate, (MACROBID) 100 MG capsule Other possible etiologies that could have similar symptoms discussed. Empiric treatment for UTI started based on symptoms she is reporting.  Clearly instructed about warning signs. F/U if symptoms persist, in which case we may need to arrange lab appointment for UA, microscopic, and urine culture.  Gross hematuria She is reporting urologic evaluation a few years ago due to gross hematuria. Instructed  about warning signs.   Anxiety and depression Problem is better controlled. No changes on clonazepam dose, we discussed some side effects. Instructed to keep next follow-up appointment, around 10/2018.  Insomnia Great improvement with doxepin 10 mg daily. No changes in current management. Good sleep hygiene is also recommended. I will see her back in 10/2018.   I discussed the assessment and treatment plan with the patient. The patient was provided an opportunity to ask questions and all were answered. The patient agreed with the plan and demonstrated an understanding of the instructions.   The patient was advised to call back or seek an in-person evaluation if the symptoms worsen or if the condition fails to improve as anticipated.   Return if symptoms worsen or fail to improve, for Keep f/u appt.  Betty Martinique, MD

## 2018-07-07 NOTE — Telephone Encounter (Signed)
Spoke with patient. Patient scheduled for 2:45 PM via web ex with you. Patient reports she is taking Azo which is helping but she is having some blood in her urine.

## 2018-07-07 NOTE — Telephone Encounter (Signed)
Pt called in c/o of UTI symptoms.   Burning, frequency with drippling when she does go, blood on tissue when she wipes.   No fever.   See triage notes.  I let her know Dr. Martinique is not seeing pts in the office due to the coronavirus pandemic going on.  The office will call you set up a video chat/phone call visit.   You are agreeable to this.   I verified her phone number and e mail address.   Reason for Disposition . Urinating more frequently than usual (i.e., frequency)  Answer Assessment - Initial Assessment Questions 1. SYMPTOM: "What's the main symptom you're concerned about?" (e.g., frequency, incontinence)     It started Monday.  Last night it was worse   I got some AZo for the pain.   I feel like I need to go but it's a dripple.   I have blood when I wipe.   No fever.  I've had these infections before   Sometimes I can pee better after the Axoo and it controls the burning.   I was on an antibiotic from the ENT doctor. 2. ONSET: "When did the  blood  start?"     Last night 3. PAIN: "Is there any pain?" If so, ask: "How bad is it?" (Scale: 1-10; mild, moderate, severe)     Yes 4. CAUSE: "What do you think is causing the symptoms?"     UTI 5. OTHER SYMPTOMS: "Do you have any other symptoms?" (e.g., fever, flank pain, blood in urine, pain with urination)     No fever or abd pain.   Just blood on tissue when I wipe. 6. PREGNANCY: "Is there any chance you are pregnant?" "When was your last menstrual period?"     N/A  Protocols used: URINARY [facility]

## 2018-07-07 NOTE — Assessment & Plan Note (Signed)
Great improvement with doxepin 10 mg daily. No changes in current management. Good sleep hygiene is also recommended. I will see her back in 10/2018.

## 2018-07-08 ENCOUNTER — Encounter: Payer: Self-pay | Admitting: Family Medicine

## 2018-07-08 ENCOUNTER — Other Ambulatory Visit: Payer: Self-pay | Admitting: *Deleted

## 2018-07-08 DIAGNOSIS — R319 Hematuria, unspecified: Principal | ICD-10-CM

## 2018-07-08 DIAGNOSIS — N390 Urinary tract infection, site not specified: Secondary | ICD-10-CM

## 2018-07-08 MED ORDER — NITROFURANTOIN MONOHYD MACRO 100 MG PO CAPS: 100.0000 mg | ORAL_CAPSULE | Freq: Two times a day (BID) | ORAL | 0 refills | Status: AC

## 2018-07-09 ENCOUNTER — Other Ambulatory Visit: Payer: Self-pay | Admitting: *Deleted

## 2018-07-09 DIAGNOSIS — E1165 Type 2 diabetes mellitus with hyperglycemia: Secondary | ICD-10-CM

## 2018-07-09 MED ORDER — ACCU-CHEK AVIVA PLUS W/DEVICE KIT: PACK | 1 refills | Status: DC

## 2018-07-12 ENCOUNTER — Ambulatory Visit: Payer: Medicare HMO | Admitting: Registered Nurse

## 2018-07-13 ENCOUNTER — Encounter: Payer: Medicare HMO | Attending: Physical Medicine & Rehabilitation | Admitting: Registered Nurse

## 2018-07-13 ENCOUNTER — Other Ambulatory Visit: Payer: Self-pay

## 2018-07-13 VITALS — BP 156/90 | HR 81 | Temp 97.6°F | Ht 68.0 in | Wt 202.8 lb

## 2018-07-13 DIAGNOSIS — T1490XA Injury, unspecified, initial encounter: Secondary | ICD-10-CM | POA: Diagnosis not present

## 2018-07-13 DIAGNOSIS — E119 Type 2 diabetes mellitus without complications: Secondary | ICD-10-CM | POA: Diagnosis not present

## 2018-07-13 DIAGNOSIS — F329 Major depressive disorder, single episode, unspecified: Secondary | ICD-10-CM | POA: Insufficient documentation

## 2018-07-13 DIAGNOSIS — M75102 Unspecified rotator cuff tear or rupture of left shoulder, not specified as traumatic: Secondary | ICD-10-CM

## 2018-07-13 DIAGNOSIS — M47812 Spondylosis without myelopathy or radiculopathy, cervical region: Secondary | ICD-10-CM | POA: Diagnosis not present

## 2018-07-13 DIAGNOSIS — M542 Cervicalgia: Secondary | ICD-10-CM | POA: Diagnosis not present

## 2018-07-13 DIAGNOSIS — M7918 Myalgia, other site: Secondary | ICD-10-CM | POA: Diagnosis not present

## 2018-07-13 DIAGNOSIS — M5030 Other cervical disc degeneration, unspecified cervical region: Secondary | ICD-10-CM | POA: Insufficient documentation

## 2018-07-13 DIAGNOSIS — M25512 Pain in left shoulder: Secondary | ICD-10-CM | POA: Diagnosis not present

## 2018-07-13 DIAGNOSIS — G2581 Restless legs syndrome: Secondary | ICD-10-CM | POA: Diagnosis not present

## 2018-07-13 DIAGNOSIS — G8929 Other chronic pain: Secondary | ICD-10-CM | POA: Insufficient documentation

## 2018-07-13 DIAGNOSIS — I10 Essential (primary) hypertension: Secondary | ICD-10-CM | POA: Diagnosis not present

## 2018-07-13 DIAGNOSIS — G894 Chronic pain syndrome: Secondary | ICD-10-CM

## 2018-07-13 DIAGNOSIS — F4310 Post-traumatic stress disorder, unspecified: Secondary | ICD-10-CM | POA: Insufficient documentation

## 2018-07-13 DIAGNOSIS — Z5181 Encounter for therapeutic drug level monitoring: Secondary | ICD-10-CM | POA: Diagnosis not present

## 2018-07-13 DIAGNOSIS — G5793 Unspecified mononeuropathy of bilateral lower limbs: Secondary | ICD-10-CM | POA: Diagnosis not present

## 2018-07-13 DIAGNOSIS — M545 Low back pain, unspecified: Secondary | ICD-10-CM

## 2018-07-13 DIAGNOSIS — E785 Hyperlipidemia, unspecified: Secondary | ICD-10-CM | POA: Insufficient documentation

## 2018-07-13 DIAGNOSIS — M5412 Radiculopathy, cervical region: Secondary | ICD-10-CM

## 2018-07-13 DIAGNOSIS — R102 Pelvic and perineal pain: Secondary | ICD-10-CM | POA: Diagnosis not present

## 2018-07-13 DIAGNOSIS — Z79891 Long term (current) use of opiate analgesic: Secondary | ICD-10-CM

## 2018-07-13 MED ORDER — HYDROCODONE-ACETAMINOPHEN 5-325 MG PO TABS: 1.0000 | ORAL_TABLET | Freq: Four times a day (QID) | ORAL | 0 refills | Status: DC | PRN
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
## 2018-07-18 ENCOUNTER — Encounter: Payer: Self-pay | Admitting: Registered Nurse

## 2018-07-22 ENCOUNTER — Other Ambulatory Visit: Payer: Self-pay | Admitting: *Deleted

## 2018-07-22 DIAGNOSIS — E1165 Type 2 diabetes mellitus with hyperglycemia: Secondary | ICD-10-CM

## 2018-07-22 MED ORDER — ACCU-CHEK AVIVA PLUS W/DEVICE KIT: PACK | 1 refills | Status: DC

## 2018-08-11 ENCOUNTER — Other Ambulatory Visit: Payer: Self-pay | Admitting: *Deleted

## 2018-08-11 MED ORDER — OMEPRAZOLE 40 MG PO CPDR: 40.0000 mg | DELAYED_RELEASE_CAPSULE | Freq: Every day | ORAL | 3 refills | Status: DC

## 2018-08-12 ENCOUNTER — Other Ambulatory Visit: Payer: Self-pay | Admitting: Family Medicine

## 2018-08-17 ENCOUNTER — Encounter: Payer: Self-pay | Admitting: Family Medicine

## 2018-08-20 ENCOUNTER — Other Ambulatory Visit: Payer: Self-pay

## 2018-08-20 ENCOUNTER — Ambulatory Visit (INDEPENDENT_AMBULATORY_CARE_PROVIDER_SITE_OTHER): Payer: Medicare HMO | Admitting: Family Medicine

## 2018-08-20 VITALS — Resp 16

## 2018-08-20 DIAGNOSIS — G4700 Insomnia, unspecified: Secondary | ICD-10-CM

## 2018-08-20 DIAGNOSIS — F329 Major depressive disorder, single episode, unspecified: Secondary | ICD-10-CM | POA: Diagnosis not present

## 2018-08-20 DIAGNOSIS — I10 Essential (primary) hypertension: Secondary | ICD-10-CM

## 2018-08-20 DIAGNOSIS — E1149 Type 2 diabetes mellitus with other diabetic neurological complication: Secondary | ICD-10-CM

## 2018-08-20 DIAGNOSIS — F32A Depression, unspecified: Secondary | ICD-10-CM

## 2018-08-20 DIAGNOSIS — F419 Anxiety disorder, unspecified: Secondary | ICD-10-CM

## 2018-08-20 MED ORDER — DOXEPIN HCL 10 MG PO CAPS: 10.0000 mg | ORAL_CAPSULE | Freq: Every day | ORAL | 1 refills | Status: DC

## 2018-08-20 NOTE — Assessment & Plan Note (Signed)
BP has been adequately controlled. No changes in current management. Continue low salt diet. Recommend monitoring BP regularly. Eye exam is overdue.

## 2018-08-20 NOTE — Assessment & Plan Note (Addendum)
HgA1C has been at goal.She does not feel comfortable coming to the clinic for lab work, so we will plan on having labs done next office visit. No changes in current management. Regular exercise and healthy diet with avoidance of added sugar food intake is an important part of treatment and recommended. Annual eye exam, periodic dental and foot care recommended. F/U in 4 months

## 2018-08-20 NOTE — Assessment & Plan Note (Addendum)
Problem has been stable. Copying well with stress.  Continue clonazepam 1 mg twice daily as needed. Continue Doxepin. She does not think she needs another antidepressant at this time.  Instructed about warning signs. Follow-up in 3 to 4 months.

## 2018-08-20 NOTE — Assessment & Plan Note (Signed)
Improved. Continue doxepin 10 mg at bedtime. Good sleep hygiene. Follow-up in 3 to 4 months.

## 2018-08-20 NOTE — Progress Notes (Signed)
Virtual Visit via Video Note   I connected with [name] on 08/21/18 at  2:45 PM EDT by a video enabled telemedicine application and verified that I am speaking with the correct person using two identifiers.  Location patient: home Location provider:work Persons participating in the virtual visit: patient, provider  I discussed the limitations of evaluation and management by telemedicine and the availability of in person appointments. She expressed understanding and agreed to proceed.   HPI: Jeanette. well, is a 68 year old female with history of depression, anxiety, chronic pain, DM 2, GERD, and hyperlipidemia; who is following on chronic medical problems. Last visit on 07/07/2018, when she was complaining about urinary symptoms, resolved.  DM 2: Currently she is on Trulicity 1.5 mg weekly, she is tolerating medication well. To stress she has been eating more sweets and candy. BS 130s-150s. Negative for hypoglycemia. Denies abdominal pain, nausea,vomiting, polydipsia,polyuria, or polyphagia.  She has had intermittent blurry vision, is planning on arranging eye exam, last one over a year ago.  Lab Results  Component Value Date   HGBA1C 6.9 (A) 03/22/2018   Hypertension, currently she is on HCTZ 25 mg daily. She is not checking BP regularly. Denies severe/frequent headache, visual changes, chest pain, dyspnea,focal weakness, or edema.  Lab Results  Component Value Date   CREATININE 0.75 03/04/2018   BUN 13 03/04/2018   NA 138 03/04/2018   K 3.3 (L) 03/04/2018   CL 101 03/04/2018   CO2 27 03/04/2018   Anxiety, depression, and insomnia: Last visit doxepin 10 mg was started, she is tolerating well. Sleeping about 7 hours, she feels rested the next day.  She is also on clonazepam 1 mg twice daily. She denies suicidal thoughts. Anxiety and depression otherwise is stable. She feels like she is dealing well with stress related to COVID-19 pandemia and her fianc's health  issues.   ROS: See pertinent positives and negatives per HPI.  Past Medical History:  Diagnosis Date  . Anxiety   . Arthritis   . Diabetes mellitus without complication ([place])   . Hyperlipidemia   . Hypertension   . PTSD (post-traumatic stress disorder)     Past Surgical History:  Procedure Laterality Date  . ACHILLES TENDON SURGERY Left   . BACK SURGERY    . BLADDER SUSPENSION    . CHOLECYSTECTOMY    . NOSE SURGERY     x 2   . OTHER SURGICAL HISTORY     Breast Duct removed  . PARTIAL HYSTERECTOMY    . RECTOCELE REPAIR    . VEIN SURGERY Right    leg    Family History  Problem Relation Age of Onset  . Diabetes Mother   . Alzheimer's disease Mother   . Lung cancer Father   . Esophageal cancer Brother   . Colon cancer Neg Hx     Social History   Socioeconomic History  . Marital status: Divorced    Spouse name: Not on file  . Number of children: Not on file  . Years of education: Not on file  . Highest education level: Not on file  Occupational History  . Not on file  Social Needs  . Financial resource strain: Not on file  . Food insecurity:    Worry: Not on file    Inability: Not on file  . Transportation needs:    Medical: Not on file    Non-medical: Not on file  Tobacco Use  . Smoking status: Never Smoker  . Smokeless tobacco:  Never Used  Substance and Sexual Activity  . Alcohol use: No  . Drug use: No  . Sexual activity: Not on file  Lifestyle  . Physical activity:    Days per week: Not on file    Minutes per session: Not on file  . Stress: Not on file  Relationships  . Social connections:    Talks on phone: Not on file    Gets together: Not on file    Attends religious service: Not on file    Active member of club or organization: Not on file    Attends meetings of clubs or organizations: Not on file    Relationship status: Not on file  . Intimate partner violence:    Fear of current or ex partner: Not on file    Emotionally abused: Not  on file    Physically abused: Not on file    Forced sexual activity: Not on file  Other Topics Concern  . Not on file  Social History Narrative  . Not on file      Current Outpatient Medications:  .  ACCU-CHEK SOFTCLIX LANCETS lancets, Use to test blood sugar 3 times daily. Dx: E11.8, Disp: 100 each, Rfl: 12 .  albuterol (PROVENTIL HFA;VENTOLIN HFA) 108 (90 Base) MCG/ACT inhaler, Inhale 2 puffs into the lungs every 6 (six) hours as needed for wheezing or shortness of breath., Disp: 1 Inhaler, Rfl: 1 .  Alcohol Swabs (B-D SINGLE USE SWABS REGULAR) PADS, Use to test blood sugar 3 times daily, Disp: 100 each, Rfl: 3 .  Blood Glucose Calibration (ACCU-CHEK AVIVA) SOLN, Use as directed, Disp: 1 each, Rfl: 2 .  Blood Glucose Monitoring Suppl (ACCU-CHEK AVIVA PLUS) w/Device KIT, Use to check blood sugar 3 times daily, Disp: 1 kit, Rfl: 1 .  busPIRone (BUSPAR) 5 MG tablet, Take 1 tablet (5 mg total) by mouth 2 (two) times daily., Disp: 60 tablet, Rfl: 3 .  clonazePAM (KLONOPIN) 1 MG tablet, Continue weaning med off as discussed., Disp: 60 tablet, Rfl: 3 .  cyanocobalamin 100 MCG tablet, Take 100 mcg by mouth daily., Disp: , Rfl:  .  doxepin (SINEQUAN) 10 MG capsule, Take 1 capsule (10 mg total) by mouth at bedtime., Disp: 90 capsule, Rfl: 1 .  Dulaglutide (TRULICITY) 1.5 SW/1.0XN SOPN, Inject 1.5 mg into the skin once a week., Disp: 13 pen, Rfl: 2 .  gabapentin (NEURONTIN) 400 MG capsule, Take 2 capsules (800 mg total) by mouth at bedtime., Disp: 60 capsule, Rfl: 3 .  glucose blood (ACCU-CHEK AVIVA) test strip, Use to test blood sugar 3 times daily. Dx: 11.8, Disp: 100 each, Rfl: 12 .  hydrochlorothiazide (HYDRODIURIL) 25 MG tablet, TAKE 1 TABLET EVERY DAY, Disp: 90 tablet, Rfl: 2 .  HYDROcodone-acetaminophen (NORCO/VICODIN) 5-325 MG tablet, Take 1 tablet by mouth every 6 (six) hours as needed for moderate pain., Disp: 110 tablet, Rfl: 0 .  ipratropium (ATROVENT) 0.06 % nasal spray, Place 2 sprays  into both nostrils 4 (four) times daily., Disp: 15 mL, Rfl: 3 .  multivitamin-lutein (OCUVITE-LUTEIN) CAPS capsule, Take 1 capsule by mouth daily., Disp: , Rfl:  .  omeprazole (PRILOSEC) 40 MG capsule, Take 1 capsule (40 mg total) by mouth daily., Disp: 90 capsule, Rfl: 3  EXAM:  VITALS per patient if applicable:Resp 16   GENERAL: alert, oriented, appears well and in no acute distress  HEENT: atraumatic, conjunttiva clear, no obvious facial abnormalities on inspection.  NECK: normal movements of the head and neck  LUNGS: on  inspection no signs of respiratory distress, breathing rate appears normal, no obvious gross SOB, gasping or wheezing  CV: no obvious cyanosis  Jeanette: moves all visible extremities without noticeable abnormality  PSYCH/NEURO: pleasant and cooperative, no obvious depression,she is anxious , speech and thought processing grossly intact  ASSESSMENT AND PLAN:  Discussed the following assessment and plan:  Type 2 diabetes mellitus with neurological complications ([place]) [id number] has been at goal.She does not feel comfortable coming to the clinic for lab work, so we will plan on having labs done next office visit. No changes in current management. Regular exercise and healthy diet with avoidance of added sugar food intake is an important part of treatment and recommended. Annual eye exam, periodic dental and foot care recommended. F/U in 4 months   Essential hypertension BP has been adequately controlled. No changes in current management. Continue low salt diet. Recommend monitoring BP regularly. Eye exam is overdue.  Insomnia Improved. Continue doxepin 10 mg at bedtime. Good sleep hygiene. Follow-up in 3 to 4 months.  Anxiety and depression Problem has been stable. Copying well with stress.  Continue clonazepam 1 mg twice daily as needed. Continue Doxepin. She does not think she needs another antidepressant at this time.  Instructed about warning  signs. Follow-up in 3 to 4 months.     I discussed the assessment and treatment plan with the patient. She was provided an opportunity to ask questions and all were answered. The patient agreed with the plan and demonstrated an understanding of the instructions.   The patient was advised to call back or seek an in-person evaluation if the symptoms worsen or if the condition fails to improve as anticipated.  Return in about 4 months (around 12/21/2018) for DM II,anxiety,insomnia..    Katerra Ingman Martinique, MD

## 2018-08-21 ENCOUNTER — Encounter: Payer: Self-pay | Admitting: Family Medicine

## 2018-08-31 DIAGNOSIS — H35363 Drusen (degenerative) of macula, bilateral: Secondary | ICD-10-CM | POA: Diagnosis not present

## 2018-08-31 DIAGNOSIS — H524 Presbyopia: Secondary | ICD-10-CM | POA: Diagnosis not present

## 2018-08-31 DIAGNOSIS — E119 Type 2 diabetes mellitus without complications: Secondary | ICD-10-CM | POA: Diagnosis not present

## 2018-09-09 ENCOUNTER — Encounter: Payer: Medicare HMO | Admitting: Registered Nurse

## 2018-09-10 ENCOUNTER — Other Ambulatory Visit: Payer: Self-pay

## 2018-09-10 ENCOUNTER — Other Ambulatory Visit: Payer: Self-pay | Admitting: Registered Nurse

## 2018-09-10 ENCOUNTER — Encounter: Payer: Self-pay | Admitting: Registered Nurse

## 2018-09-10 ENCOUNTER — Encounter: Payer: Medicare HMO | Attending: Physical Medicine & Rehabilitation | Admitting: Registered Nurse

## 2018-09-10 ENCOUNTER — Telehealth: Payer: Self-pay | Admitting: *Deleted

## 2018-09-10 VITALS — BP 153/93 | HR 82 | Temp 98.4°F | Ht 68.0 in | Wt 206.4 lb

## 2018-09-10 DIAGNOSIS — G8929 Other chronic pain: Secondary | ICD-10-CM | POA: Insufficient documentation

## 2018-09-10 DIAGNOSIS — M545 Low back pain, unspecified: Secondary | ICD-10-CM

## 2018-09-10 DIAGNOSIS — M7918 Myalgia, other site: Secondary | ICD-10-CM

## 2018-09-10 DIAGNOSIS — R102 Pelvic and perineal pain: Secondary | ICD-10-CM | POA: Diagnosis not present

## 2018-09-10 DIAGNOSIS — Z79891 Long term (current) use of opiate analgesic: Secondary | ICD-10-CM

## 2018-09-10 DIAGNOSIS — M47812 Spondylosis without myelopathy or radiculopathy, cervical region: Secondary | ICD-10-CM

## 2018-09-10 DIAGNOSIS — M5030 Other cervical disc degeneration, unspecified cervical region: Secondary | ICD-10-CM | POA: Diagnosis not present

## 2018-09-10 DIAGNOSIS — G2581 Restless legs syndrome: Secondary | ICD-10-CM | POA: Insufficient documentation

## 2018-09-10 DIAGNOSIS — E785 Hyperlipidemia, unspecified: Secondary | ICD-10-CM | POA: Diagnosis not present

## 2018-09-10 DIAGNOSIS — M5412 Radiculopathy, cervical region: Secondary | ICD-10-CM

## 2018-09-10 DIAGNOSIS — I10 Essential (primary) hypertension: Secondary | ICD-10-CM | POA: Diagnosis not present

## 2018-09-10 DIAGNOSIS — E119 Type 2 diabetes mellitus without complications: Secondary | ICD-10-CM | POA: Diagnosis not present

## 2018-09-10 DIAGNOSIS — T1490XA Injury, unspecified, initial encounter: Secondary | ICD-10-CM | POA: Diagnosis not present

## 2018-09-10 DIAGNOSIS — M25512 Pain in left shoulder: Secondary | ICD-10-CM | POA: Diagnosis not present

## 2018-09-10 DIAGNOSIS — F4310 Post-traumatic stress disorder, unspecified: Secondary | ICD-10-CM | POA: Insufficient documentation

## 2018-09-10 DIAGNOSIS — G894 Chronic pain syndrome: Secondary | ICD-10-CM | POA: Diagnosis not present

## 2018-09-10 DIAGNOSIS — M542 Cervicalgia: Secondary | ICD-10-CM | POA: Diagnosis not present

## 2018-09-10 DIAGNOSIS — Z5181 Encounter for therapeutic drug level monitoring: Secondary | ICD-10-CM

## 2018-09-10 DIAGNOSIS — M7062 Trochanteric bursitis, left hip: Secondary | ICD-10-CM

## 2018-09-10 DIAGNOSIS — M75102 Unspecified rotator cuff tear or rupture of left shoulder, not specified as traumatic: Secondary | ICD-10-CM

## 2018-09-10 DIAGNOSIS — F329 Major depressive disorder, single episode, unspecified: Secondary | ICD-10-CM | POA: Insufficient documentation

## 2018-09-10 MED ORDER — HYDROCODONE-ACETAMINOPHEN 5-325 MG PO TABS: 1.0000 | ORAL_TABLET | Freq: Four times a day (QID) | ORAL | 0 refills | Status: DC | PRN

## 2018-09-10 NOTE — Telephone Encounter (Signed)
Urine drug test from February was negative for her hydrocodone parent drug and metabolites even though she reported taking the night before.  Will need a re test at June visit.
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
## 2018-09-14 ENCOUNTER — Encounter: Payer: Self-pay | Admitting: Family Medicine

## 2018-09-14 LAB — DRUG TOX MONITOR 1 W/CONF, ORAL FLD
Alprazolam: NEGATIVE ng/mL (ref <0.50)
Amphetamines: NEGATIVE ng/mL (ref <10)
Barbiturates: NEGATIVE ng/mL (ref <10)
Benzodiazepines: POSITIVE ng/mL — AB (ref <0.50)
Buprenorphine: NEGATIVE ng/mL (ref <0.10)
Chlordiazepoxide: NEGATIVE ng/mL (ref <0.50)
Clonazepam: 1.13 ng/mL — ABNORMAL HIGH (ref <0.50)
Cocaine: NEGATIVE ng/mL (ref <5.0)
Codeine: NEGATIVE ng/mL (ref <2.5)
Diazepam: NEGATIVE ng/mL (ref <0.50)
Dihydrocodeine: 4.1 ng/mL — ABNORMAL HIGH (ref <2.5)
Fentanyl: NEGATIVE ng/mL (ref <0.10)
Flunitrazepam: NEGATIVE ng/mL (ref <0.50)
Flurazepam: NEGATIVE ng/mL (ref <0.50)
Heroin Metabolite: NEGATIVE ng/mL (ref <1.0)
Hydrocodone: 37.2 ng/mL — ABNORMAL HIGH (ref <2.5)
Hydromorphone: NEGATIVE ng/mL (ref <2.5)
Lorazepam: NEGATIVE ng/mL (ref <0.50)
MARIJUANA: NEGATIVE ng/mL (ref <2.5)
MDMA: NEGATIVE ng/mL (ref <10)
Meprobamate: NEGATIVE ng/mL (ref <2.5)
Methadone: NEGATIVE ng/mL (ref <5.0)
Midazolam: NEGATIVE ng/mL (ref <0.50)
Morphine: NEGATIVE ng/mL (ref <2.5)
Nicotine Metabolite: NEGATIVE ng/mL (ref <5.0)
Nordiazepam: NEGATIVE ng/mL (ref <0.50)
Norhydrocodone: NEGATIVE ng/mL (ref <2.5)
Noroxycodone: NEGATIVE ng/mL (ref <2.5)
Opiates: POSITIVE ng/mL — AB (ref <2.5)
Oxazepam: NEGATIVE ng/mL (ref <0.50)
Oxycodone: NEGATIVE ng/mL (ref <2.5)
Oxymorphone: NEGATIVE ng/mL (ref <2.5)
Phencyclidine: NEGATIVE ng/mL (ref <10)
Tapentadol: NEGATIVE ng/mL (ref <5.0)
Temazepam: NEGATIVE ng/mL (ref <0.50)
Tramadol: NEGATIVE ng/mL (ref <5.0)
Triazolam: NEGATIVE ng/mL (ref <0.50)
Zolpidem: NEGATIVE ng/mL (ref <5.0)

## 2018-09-14 LAB — DRUG TOX ALC METAB W/CON, ORAL FLD: Alcohol Metabolite: NEGATIVE ng/mL (ref <25)

## 2018-09-14 LAB — HOUSE ACCOUNT TRACKING

## 2018-09-15 ENCOUNTER — Telehealth: Payer: Self-pay | Admitting: *Deleted

## 2018-09-15 NOTE — Telephone Encounter (Signed)
Oral swab drug screen was consistent for prescribed medications.  ?

## 2018-09-17 ENCOUNTER — Other Ambulatory Visit: Payer: Self-pay | Admitting: Family Medicine

## 2018-09-17 ENCOUNTER — Telehealth: Payer: Self-pay | Admitting: *Deleted

## 2018-09-17 DIAGNOSIS — E1149 Type 2 diabetes mellitus with other diabetic neurological complication: Secondary | ICD-10-CM

## 2018-09-17 NOTE — Telephone Encounter (Signed)
Pt calling stating that the trulicity is too expensive $136.00 and she has been taking 68 yr old metformin that she has had on hand and did not know if Dr. Martinique would like put her back on the metformin along with something else.  She has been taking 500 MG twice a day and she state she will continue to take it until she get a reply.  Pt would like to have a call [phone] to let her know something she don't want to go through the weekend with something to take for her diabetes.

## 2018-09-17 NOTE — Telephone Encounter (Signed)
[name] has called and is requesting the results of her drug test to be explained to her. I have called and discussed that her results were appropriate.

## 2018-09-19 ENCOUNTER — Other Ambulatory Visit: Payer: Self-pay | Admitting: Family Medicine

## 2018-09-19 DIAGNOSIS — F32A Depression, unspecified: Secondary | ICD-10-CM

## 2018-09-19 DIAGNOSIS — F329 Major depressive disorder, single episode, unspecified: Secondary | ICD-10-CM

## 2018-09-19 DIAGNOSIS — F419 Anxiety disorder, unspecified: Secondary | ICD-10-CM

## 2018-09-21 ENCOUNTER — Other Ambulatory Visit: Payer: Self-pay | Admitting: Family Medicine

## 2018-09-21 MED ORDER — METFORMIN HCL 500 MG PO TABS: 500.0000 mg | ORAL_TABLET | Freq: Two times a day (BID) | ORAL | 0 refills | Status: DC

## 2018-09-21 NOTE — Telephone Encounter (Signed)
Please advise. I do not see that the requested medications have been sent in. Lab appointment is scheduled for 09/22/2018.

## 2018-09-22 ENCOUNTER — Other Ambulatory Visit: Payer: Self-pay

## 2018-09-22 ENCOUNTER — Encounter: Payer: Self-pay | Admitting: Family Medicine

## 2018-09-22 ENCOUNTER — Other Ambulatory Visit (INDEPENDENT_AMBULATORY_CARE_PROVIDER_SITE_OTHER): Payer: Medicare HMO

## 2018-09-22 DIAGNOSIS — E1149 Type 2 diabetes mellitus with other diabetic neurological complication: Secondary | ICD-10-CM

## 2018-09-22 LAB — BASIC METABOLIC PANEL WITH GFR
BUN: 14 mg/dL (ref 6–23)
CO2: 29 meq/L (ref 19–32)
Calcium: 9.4 mg/dL (ref 8.4–10.5)
Chloride: 99 meq/L (ref 96–112)
Creatinine, Ser: 0.82 mg/dL (ref 0.40–1.20)
GFR: 69.6 mL/min
Glucose, Bld: 155 mg/dL — ABNORMAL HIGH (ref 70–99)
Potassium: 3.9 meq/L (ref 3.5–5.1)
Sodium: 138 meq/L (ref 135–145)

## 2018-09-22 LAB — HEMOGLOBIN A1C: Hgb A1c MFr Bld: 7.9 % — ABNORMAL HIGH (ref 4.6–6.5)

## 2018-09-23 NOTE — Telephone Encounter (Signed)
Pt states that the pharmacy has been contacting Dr. Martinique for refills and they have not gotten a response. Pt states she has 7 clonazepam left. Please advise on sending refill as she said she's been waiting 4 days.   Pt prefers the [place] as she has moved and is closer to it.  [facility] [phone] - [place], [place]

## 2018-09-24 NOTE — Telephone Encounter (Signed)
Patient was seen in office Nothing further needed.  

## 2018-09-28 DIAGNOSIS — H35363 Drusen (degenerative) of macula, bilateral: Secondary | ICD-10-CM | POA: Diagnosis not present

## 2018-09-29 ENCOUNTER — Encounter: Payer: Self-pay | Admitting: Family Medicine

## 2018-09-30 ENCOUNTER — Encounter: Payer: Self-pay | Admitting: Family Medicine

## 2018-10-06 ENCOUNTER — Telehealth: Payer: Self-pay | Admitting: *Deleted

## 2018-10-06 ENCOUNTER — Encounter: Payer: Self-pay | Admitting: Family Medicine

## 2018-10-06 NOTE — Telephone Encounter (Signed)
Patient informed that letter is complete and placed at front desk for pick-up.  Copied from [place] [phone]. Topic: General - Other >> Oct 06, 2018 12:50 PM Marin Olp L wrote: Reason for CRM: Patient wants Dr. Martinique to know she will be in [place] next week and would like her letter to be ready by then. She's not sure exactly what day. See mychart message 09/30/18.

## 2018-10-28 ENCOUNTER — Encounter: Payer: Self-pay | Admitting: Family Medicine

## 2018-11-10 ENCOUNTER — Encounter: Payer: Medicare HMO | Attending: Physical Medicine & Rehabilitation | Admitting: Physical Medicine & Rehabilitation

## 2018-11-10 ENCOUNTER — Encounter: Payer: Self-pay | Admitting: Physical Medicine & Rehabilitation

## 2018-11-10 ENCOUNTER — Other Ambulatory Visit: Payer: Self-pay

## 2018-11-10 DIAGNOSIS — M542 Cervicalgia: Secondary | ICD-10-CM | POA: Diagnosis not present

## 2018-11-10 DIAGNOSIS — E119 Type 2 diabetes mellitus without complications: Secondary | ICD-10-CM | POA: Insufficient documentation

## 2018-11-10 DIAGNOSIS — R102 Pelvic and perineal pain: Secondary | ICD-10-CM | POA: Insufficient documentation

## 2018-11-10 DIAGNOSIS — G8929 Other chronic pain: Secondary | ICD-10-CM | POA: Diagnosis not present

## 2018-11-10 DIAGNOSIS — M5030 Other cervical disc degeneration, unspecified cervical region: Secondary | ICD-10-CM | POA: Insufficient documentation

## 2018-11-10 DIAGNOSIS — I10 Essential (primary) hypertension: Secondary | ICD-10-CM | POA: Insufficient documentation

## 2018-11-10 DIAGNOSIS — G2581 Restless legs syndrome: Secondary | ICD-10-CM

## 2018-11-10 DIAGNOSIS — T1490XA Injury, unspecified, initial encounter: Secondary | ICD-10-CM | POA: Insufficient documentation

## 2018-11-10 DIAGNOSIS — E785 Hyperlipidemia, unspecified: Secondary | ICD-10-CM | POA: Diagnosis not present

## 2018-11-10 DIAGNOSIS — M545 Low back pain: Secondary | ICD-10-CM | POA: Insufficient documentation

## 2018-11-10 DIAGNOSIS — G894 Chronic pain syndrome: Secondary | ICD-10-CM

## 2018-11-10 DIAGNOSIS — M25512 Pain in left shoulder: Secondary | ICD-10-CM | POA: Diagnosis not present

## 2018-11-10 DIAGNOSIS — M7918 Myalgia, other site: Secondary | ICD-10-CM | POA: Diagnosis not present

## 2018-11-10 DIAGNOSIS — F4310 Post-traumatic stress disorder, unspecified: Secondary | ICD-10-CM | POA: Diagnosis not present

## 2018-11-10 DIAGNOSIS — F329 Major depressive disorder, single episode, unspecified: Secondary | ICD-10-CM | POA: Insufficient documentation

## 2018-11-10 MED ORDER — HYDROCODONE-ACETAMINOPHEN 5-325 MG PO TABS: 1.0000 | ORAL_TABLET | Freq: Four times a day (QID) | ORAL | 0 refills | Status: DC | PRN

## 2018-11-10 MED ORDER — GABAPENTIN 400 MG PO CAPS: 400.0000 mg | ORAL_CAPSULE | Freq: Three times a day (TID) | ORAL | 3 refills | Status: DC

## 2018-11-10 NOTE — Patient Instructions (Signed)
PLEASE FEEL FREE TO CALL OUR OFFICE WITH ANY PROBLEMS OR QUESTIONS ([phone])     TRY VOLTAREN GEL TO YOUR FEET.
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
## 2018-11-10 NOTE — Addendum Note (Signed)
Addended by: Alger Simons T on: 11/10/2018 02:12 PM   Modules accepted: Orders

## 2018-11-12 ENCOUNTER — Encounter: Payer: Self-pay | Admitting: Family Medicine

## 2018-11-17 ENCOUNTER — Ambulatory Visit (INDEPENDENT_AMBULATORY_CARE_PROVIDER_SITE_OTHER): Payer: Medicare HMO | Admitting: Family Medicine

## 2018-11-17 ENCOUNTER — Encounter: Payer: Self-pay | Admitting: Family Medicine

## 2018-11-17 ENCOUNTER — Other Ambulatory Visit: Payer: Self-pay

## 2018-11-17 VITALS — BP 124/80 | HR 78 | Temp 97.6°F | Resp 16 | Ht 68.0 in | Wt 197.5 lb

## 2018-11-17 DIAGNOSIS — G63 Polyneuropathy in diseases classified elsewhere: Secondary | ICD-10-CM

## 2018-11-17 DIAGNOSIS — E1149 Type 2 diabetes mellitus with other diabetic neurological complication: Secondary | ICD-10-CM

## 2018-11-17 DIAGNOSIS — Z23 Encounter for immunization: Secondary | ICD-10-CM | POA: Diagnosis not present

## 2018-11-17 DIAGNOSIS — B351 Tinea unguium: Secondary | ICD-10-CM

## 2018-11-17 DIAGNOSIS — Z780 Asymptomatic menopausal state: Secondary | ICD-10-CM | POA: Diagnosis not present

## 2018-11-17 NOTE — Patient Instructions (Signed)
A few things to remember from today's visit:   Asymptomatic postmenopausal estrogen deficiency - Plan: DG Bone Density  Type 2 diabetes mellitus with neurological complications ([place])  Onychomycosis of great toe  Polyneuropathy associated with underlying disease ([place])  Trulicity resumed today. Please be sure medication list is accurate. If a new problem present, please set up appointment sooner than planned today.

## 2018-11-17 NOTE — Progress Notes (Signed)
HPI: Chief Complaint  Patient presents with  . Follow-up    blood sugars running high    Chief Complaint  Patient presents with  . Follow-up    blood sugars running high    [name] is a 68 y.o. female, who is here today complaining of elevated glucose. Currently she is on nonpharmacologic treatment. She was going to try metformin again but she did not, it caused leg cramps and diarrhea in the past. She applied for medication assisted, she is supposed to get Trulicity in a few days.  Denies abdominal pain, nausea,vomiting, polydipsia,polyuria, or polyphagia.  Lab Results  Component Value Date   HGBA1C 7.9 (H) 09/22/2018   Having left foot numbness and bilateral toes tingling. She has not noted erythema, cyanosis, or skin changes. For a while she has been noticing changes in right great toenail. There is no pain, erythema, or edema. No history of trauma. She states that she was diagnosed with onychomycosis.  She has not noted fever,chills,decreased appetite,or unusual fatigue.  Review of Systems  Constitutional: Negative for activity change.  HENT: Negative for mouth sores, nosebleeds and trouble swallowing.   Eyes: Negative for redness and visual disturbance.  Respiratory: Negative for cough, shortness of breath and wheezing.   Cardiovascular: Negative for chest pain, palpitations and leg swelling.  Gastrointestinal:       Negative for changes in bowel habits.  Genitourinary: Negative for decreased urine volume and hematuria.  Skin: Negative for rash and wound.  Neurological: Negative for syncope, weakness and headaches.  Rest see pertinent positives and negatives per HPI.   Current Outpatient Medications on File Prior to Visit  Medication Sig Dispense Refill  . ACCU-CHEK SOFTCLIX LANCETS lancets Use to test blood sugar 3 times daily. Dx: E11.8 100 each 12  . albuterol (PROVENTIL HFA;VENTOLIN HFA) 108 (90 Base) MCG/ACT inhaler Inhale 2  puffs into the lungs every 6 (six) hours as needed for wheezing or shortness of breath. 1 Inhaler 1  . Alcohol Swabs (B-D SINGLE USE SWABS REGULAR) PADS Use to test blood sugar 3 times daily 100 each 3  . Blood Glucose Calibration (ACCU-CHEK AVIVA) SOLN Use as directed 1 each 2  . Blood Glucose Monitoring Suppl (ACCU-CHEK AVIVA PLUS) w/Device KIT Use to check blood sugar 3 times daily 1 kit 1  . busPIRone (BUSPAR) 5 MG tablet Take 1 tablet (5 mg total) by mouth 2 (two) times daily. 60 tablet 3  . clonazePAM (KLONOPIN) 1 MG tablet Take 1 tablet (1 mg total) by mouth 2 (two) times daily as needed for anxiety. CONTINUE WEANING OFF AS DIRECTED 60 tablet 3  . cyanocobalamin 100 MCG tablet Take 100 mcg by mouth daily.    Marland Kitchen doxepin (SINEQUAN) 10 MG capsule Take 1 capsule (10 mg total) by mouth at bedtime. 90 capsule 1  . gabapentin (NEURONTIN) 400 MG capsule Take 1 capsule (400 mg total) by mouth 3 (three) times daily. 90 capsule 3  . glucose blood (ACCU-CHEK AVIVA) test strip Use to test blood sugar 3 times daily. Dx: 11.8 100 each 12  . hydrochlorothiazide (HYDRODIURIL) 25 MG tablet TAKE 1 TABLET EVERY DAY 90 tablet 2  . HYDROcodone-acetaminophen (NORCO/VICODIN) 5-325 MG tablet Take 1 tablet by mouth every 6 (six) hours as needed for moderate pain. 110 tablet 0  . ipratropium (ATROVENT) 0.06 % nasal spray Place 2 sprays into both nostrils 4 (four) times daily. 15 mL 3  . metFORMIN (GLUCOPHAGE) 500 MG  tablet Take 1 tablet (500 mg total) by mouth 2 (two) times daily with a meal. 180 tablet 0  . multivitamin-lutein (OCUVITE-LUTEIN) CAPS capsule Take 1 capsule by mouth daily.    Marland Kitchen omeprazole (PRILOSEC) 40 MG capsule Take 1 capsule (40 mg total) by mouth daily. 90 capsule 3  . Dulaglutide (TRULICITY) 1.5 HW/2.9HB SOPN Inject 1.5 mg into the skin once a week. (Patient not taking: Reported on 11/17/2018) 13 pen 2   No current facility-administered medications on file prior to visit.      Past Medical  History:  Diagnosis Date  . Anxiety   . Arthritis   . Diabetes mellitus without complication ([place])   . Hyperlipidemia   . Hypertension   . PTSD (post-traumatic stress disorder)    Allergies  Allergen Reactions  . Hydromorphone Nausea And Vomiting and Other (See Comments)    Made the patient feel "spaced out," also Made the patient feel "spaced out," also  . Morphine And Related Itching  . Other Itching  . Penicillins Rash    Has patient had a PCN reaction causing immediate rash, facial/tongue/throat swelling, SOB or lightheadedness with hypotension: Yes Has patient had a PCN reaction causing severe rash involving mucus membranes or skin necrosis: No Has patient had a PCN reaction that required hospitalization: No Has patient had a PCN reaction occurring within the last 10 years: No If all of the above answers are "NO", then may proceed with Cephalosporin use.     Social History   Socioeconomic History  . Marital status: Divorced    Spouse name: Not on file  . Number of children: Not on file  . Years of education: Not on file  . Highest education level: Not on file  Occupational History  . Not on file  Social Needs  . Financial resource strain: Not on file  . Food insecurity    Worry: Not on file    Inability: Not on file  . Transportation needs    Medical: Not on file    Non-medical: Not on file  Tobacco Use  . Smoking status: Never Smoker  . Smokeless tobacco: Never Used  Substance and Sexual Activity  . Alcohol use: No  . Drug use: No  . Sexual activity: Not on file  Lifestyle  . Physical activity    Days per week: Not on file    Minutes per session: Not on file  . Stress: Not on file  Relationships  . Social Herbalist on phone: Not on file    Gets together: Not on file    Attends religious service: Not on file    Active member of club or organization: Not on file    Attends meetings of clubs or organizations: Not on file    Relationship  status: Not on file  Other Topics Concern  . Not on file  Social History Narrative  . Not on file    Vitals:   11/17/18 1615  BP: 124/80  Pulse: 78  Resp: 16  Temp: 97.6 F (36.4 C)  SpO2: 98%   Body mass index is 30.03 kg/m.   Physical Exam  Nursing note and vitals reviewed. Constitutional: She is oriented to person, place, and time. She appears well-developed. No distress.  HENT:  Head: Normocephalic and atraumatic.  Mouth/Throat: Oropharynx is clear and moist and mucous membranes are normal.  Eyes: Conjunctivae are normal.  Cardiovascular: Normal rate and regular rhythm.  No murmur heard. Pulses:  Dorsalis pedis pulses are 2+ on the right side and 2+ on the left side.  Respiratory: Effort normal and breath sounds normal. No respiratory distress.  GI: Soft. She exhibits no mass. There is no hepatomegaly. There is no abdominal tenderness.  Musculoskeletal:        General: No edema.  Lymphadenopathy:    She has no cervical adenopathy.  Neurological: She is alert and oriented to person, place, and time. She has normal strength. No cranial nerve deficit. Gait normal.  Skin: Skin is warm. No rash noted. No erythema.  Right great toenail with debris in between toenail and bed of nail. Distal/lateral toenail is detached from nailbed. There is no periungual erythema or edema.  Psychiatric: Her mood appears anxious.  Well groomed, good eye contact.   ASSESSMENT AND PLAN:  Ms Essica was seen today for follow-up.  Diagnoses and all orders for this visit:  Type 2 diabetes mellitus with neurological complications ([place]) Trulicity 9.67 mg samples given x4, she will need to give herself 2 injections (1.5 mg). She has tolerated Trulicity well in the past and while on this medication her DM2 was very well controlled.Continue monitoring BS's. Follow-up in 2-3 months.  Asymptomatic postmenopausal estrogen deficiency -     DG Bone Density; Future  Onychomycosis of great toe  Educated about diagnosis and prognosis. I do not recommend antimycotic medication. Recommend soaking feet in water and vinegar, ratio 1-1. Topical Vicks VapoRub daily at bedtime.  Polyneuropathy associated with underlying disease ([place]) We discussed the importance of appropriate foot care and avoidance of trauma. Adequate glucose control.  Need for pneumococcal vaccination -     Pneumococcal polysaccharide vaccine 23-valent greater than or equal to 2yo subcutaneous/IM   Return in about 2 months (around 01/17/2019).   -Betty G. Martinique, MD  [facility]. [place] office.

## 2018-11-22 ENCOUNTER — Encounter: Payer: Self-pay | Admitting: Family Medicine

## 2019-01-10 ENCOUNTER — Encounter: Payer: Self-pay | Admitting: Registered Nurse

## 2019-01-10 ENCOUNTER — Other Ambulatory Visit: Payer: Self-pay

## 2019-01-10 ENCOUNTER — Encounter: Payer: Medicare HMO | Attending: Physical Medicine & Rehabilitation | Admitting: Registered Nurse

## 2019-01-10 VITALS — BP 151/85 | HR 71 | Temp 97.7°F | Ht 68.0 in | Wt 195.0 lb

## 2019-01-10 DIAGNOSIS — M75102 Unspecified rotator cuff tear or rupture of left shoulder, not specified as traumatic: Secondary | ICD-10-CM | POA: Diagnosis not present

## 2019-01-10 DIAGNOSIS — M5412 Radiculopathy, cervical region: Secondary | ICD-10-CM

## 2019-01-10 DIAGNOSIS — E119 Type 2 diabetes mellitus without complications: Secondary | ICD-10-CM | POA: Insufficient documentation

## 2019-01-10 DIAGNOSIS — F4310 Post-traumatic stress disorder, unspecified: Secondary | ICD-10-CM | POA: Diagnosis not present

## 2019-01-10 DIAGNOSIS — Z79891 Long term (current) use of opiate analgesic: Secondary | ICD-10-CM

## 2019-01-10 DIAGNOSIS — G8929 Other chronic pain: Secondary | ICD-10-CM | POA: Diagnosis not present

## 2019-01-10 DIAGNOSIS — F329 Major depressive disorder, single episode, unspecified: Secondary | ICD-10-CM | POA: Diagnosis not present

## 2019-01-10 DIAGNOSIS — T1490XA Injury, unspecified, initial encounter: Secondary | ICD-10-CM | POA: Diagnosis not present

## 2019-01-10 DIAGNOSIS — G2581 Restless legs syndrome: Secondary | ICD-10-CM

## 2019-01-10 DIAGNOSIS — I10 Essential (primary) hypertension: Secondary | ICD-10-CM | POA: Diagnosis not present

## 2019-01-10 DIAGNOSIS — Z5181 Encounter for therapeutic drug level monitoring: Secondary | ICD-10-CM | POA: Diagnosis not present

## 2019-01-10 DIAGNOSIS — M47812 Spondylosis without myelopathy or radiculopathy, cervical region: Secondary | ICD-10-CM

## 2019-01-10 DIAGNOSIS — M545 Low back pain, unspecified: Secondary | ICD-10-CM

## 2019-01-10 DIAGNOSIS — E785 Hyperlipidemia, unspecified: Secondary | ICD-10-CM | POA: Insufficient documentation

## 2019-01-10 DIAGNOSIS — M5030 Other cervical disc degeneration, unspecified cervical region: Secondary | ICD-10-CM | POA: Insufficient documentation

## 2019-01-10 DIAGNOSIS — M7918 Myalgia, other site: Secondary | ICD-10-CM

## 2019-01-10 DIAGNOSIS — G894 Chronic pain syndrome: Secondary | ICD-10-CM

## 2019-01-10 DIAGNOSIS — R102 Pelvic and perineal pain: Secondary | ICD-10-CM | POA: Insufficient documentation

## 2019-01-10 DIAGNOSIS — M542 Cervicalgia: Secondary | ICD-10-CM | POA: Diagnosis not present

## 2019-01-10 DIAGNOSIS — M25512 Pain in left shoulder: Secondary | ICD-10-CM | POA: Diagnosis not present

## 2019-01-10 MED ORDER — HYDROCODONE-ACETAMINOPHEN 5-325 MG PO TABS: 1.0000 | ORAL_TABLET | Freq: Four times a day (QID) | ORAL | 0 refills | Status: DC | PRN
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
## 2019-01-24 ENCOUNTER — Telehealth: Payer: Self-pay | Admitting: Family Medicine

## 2019-01-24 DIAGNOSIS — F32A Depression, unspecified: Secondary | ICD-10-CM

## 2019-01-24 DIAGNOSIS — F419 Anxiety disorder, unspecified: Secondary | ICD-10-CM

## 2019-01-24 DIAGNOSIS — F329 Major depressive disorder, single episode, unspecified: Secondary | ICD-10-CM

## 2019-01-30 ENCOUNTER — Encounter: Payer: Self-pay | Admitting: Family Medicine

## 2019-01-31 ENCOUNTER — Telehealth: Payer: Self-pay | Admitting: Family Medicine

## 2019-01-31 NOTE — Telephone Encounter (Signed)
Pharmacy did not receive Rx for clonazePAM (KLONOPIN) 1 MG tablet  May have not gone through and says the class id print/ Please resend asap / Pt has been without meds for a few days

## 2019-01-31 NOTE — Telephone Encounter (Signed)
Patient requesting call back from [place] regarding this request.

## 2019-01-31 NOTE — Telephone Encounter (Signed)
Please advise 

## 2019-02-01 ENCOUNTER — Other Ambulatory Visit: Payer: Self-pay | Admitting: Family Medicine

## 2019-02-01 DIAGNOSIS — F419 Anxiety disorder, unspecified: Secondary | ICD-10-CM

## 2019-02-01 DIAGNOSIS — F329 Major depressive disorder, single episode, unspecified: Secondary | ICD-10-CM

## 2019-02-01 DIAGNOSIS — F32A Depression, unspecified: Secondary | ICD-10-CM

## 2019-02-01 MED ORDER — CLONAZEPAM 1 MG PO TABS: ORAL_TABLET | ORAL | 0 refills | Status: DC

## 2019-02-01 NOTE — Telephone Encounter (Signed)
Noted this is a different pharmacy , because controlled med it is supposed to be filled in same pharmacy. Please update pharmacy information if this is going to be the pharmacy she is going to continue using. Rx sent. Betty Martinique, MD

## 2019-02-01 NOTE — Telephone Encounter (Signed)
Spoke with pt verbalized understanding that her Rx was sent to her pharmacy

## 2019-02-03 NOTE — Telephone Encounter (Signed)
Spoke with pt states that she picked up her RX , Pt requests to send Rx to [facility] in [place]. A note has been left on pt medication list.

## 2019-02-14 DIAGNOSIS — L821 Other seborrheic keratosis: Secondary | ICD-10-CM | POA: Diagnosis not present

## 2019-02-14 DIAGNOSIS — D485 Neoplasm of uncertain behavior of skin: Secondary | ICD-10-CM | POA: Diagnosis not present

## 2019-02-14 DIAGNOSIS — L570 Actinic keratosis: Secondary | ICD-10-CM | POA: Diagnosis not present

## 2019-02-14 DIAGNOSIS — L819 Disorder of pigmentation, unspecified: Secondary | ICD-10-CM | POA: Diagnosis not present

## 2019-02-14 DIAGNOSIS — Z85828 Personal history of other malignant neoplasm of skin: Secondary | ICD-10-CM | POA: Diagnosis not present

## 2019-02-16 ENCOUNTER — Encounter: Payer: Self-pay | Admitting: Family Medicine

## 2019-02-21 DIAGNOSIS — L570 Actinic keratosis: Secondary | ICD-10-CM | POA: Diagnosis not present

## 2019-02-28 ENCOUNTER — Other Ambulatory Visit: Payer: Self-pay

## 2019-03-01 ENCOUNTER — Other Ambulatory Visit: Payer: Self-pay | Admitting: Family Medicine

## 2019-03-01 ENCOUNTER — Telehealth (INDEPENDENT_AMBULATORY_CARE_PROVIDER_SITE_OTHER): Payer: Medicare HMO | Admitting: Family Medicine

## 2019-03-01 ENCOUNTER — Encounter: Payer: Medicare HMO | Admitting: Family Medicine

## 2019-03-01 ENCOUNTER — Encounter: Payer: Self-pay | Admitting: Family Medicine

## 2019-03-01 VITALS — Ht 68.0 in

## 2019-03-01 DIAGNOSIS — R05 Cough: Secondary | ICD-10-CM

## 2019-03-01 DIAGNOSIS — E1149 Type 2 diabetes mellitus with other diabetic neurological complication: Secondary | ICD-10-CM | POA: Diagnosis not present

## 2019-03-01 DIAGNOSIS — J989 Respiratory disorder, unspecified: Secondary | ICD-10-CM

## 2019-03-01 DIAGNOSIS — F32A Depression, unspecified: Secondary | ICD-10-CM

## 2019-03-01 DIAGNOSIS — F419 Anxiety disorder, unspecified: Secondary | ICD-10-CM

## 2019-03-01 DIAGNOSIS — G4700 Insomnia, unspecified: Secondary | ICD-10-CM

## 2019-03-01 DIAGNOSIS — R0989 Other specified symptoms and signs involving the circulatory and respiratory systems: Secondary | ICD-10-CM | POA: Diagnosis not present

## 2019-03-01 DIAGNOSIS — J069 Acute upper respiratory infection, unspecified: Secondary | ICD-10-CM | POA: Diagnosis not present

## 2019-03-01 DIAGNOSIS — R059 Cough, unspecified: Secondary | ICD-10-CM

## 2019-03-01 DIAGNOSIS — F329 Major depressive disorder, single episode, unspecified: Secondary | ICD-10-CM | POA: Diagnosis not present

## 2019-03-01 MED ORDER — TRULICITY 1.5 MG/0.5ML ~~LOC~~ SOAJ: 1.5000 mg | SUBCUTANEOUS | 3 refills | Status: DC

## 2019-03-01 MED ORDER — DOXEPIN HCL 10 MG PO CAPS: 10.0000 mg | ORAL_CAPSULE | Freq: Every day | ORAL | 0 refills | Status: DC

## 2019-03-01 MED ORDER — PREDNISONE 20 MG PO TABS: 40.0000 mg | ORAL_TABLET | Freq: Every day | ORAL | 0 refills | Status: AC

## 2019-03-01 MED ORDER — ALBUTEROL SULFATE HFA 108 (90 BASE) MCG/ACT IN AERS: 2.0000 | INHALATION_SPRAY | Freq: Four times a day (QID) | RESPIRATORY_TRACT | 1 refills | Status: DC | PRN

## 2019-03-01 MED ORDER — BENZONATATE 100 MG PO CAPS: 200.0000 mg | ORAL_CAPSULE | Freq: Two times a day (BID) | ORAL | 0 refills | Status: AC | PRN
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
## 2019-03-01 NOTE — Telephone Encounter (Signed)
Last filled 02/01/2019 Last OV 11/26/2018

## 2019-03-06 DIAGNOSIS — Z79899 Other long term (current) drug therapy: Secondary | ICD-10-CM | POA: Diagnosis not present

## 2019-03-06 DIAGNOSIS — R0602 Shortness of breath: Secondary | ICD-10-CM | POA: Diagnosis not present

## 2019-03-06 DIAGNOSIS — Z794 Long term (current) use of insulin: Secondary | ICD-10-CM | POA: Diagnosis not present

## 2019-03-06 DIAGNOSIS — R42 Dizziness and giddiness: Secondary | ICD-10-CM | POA: Diagnosis not present

## 2019-03-06 DIAGNOSIS — I10 Essential (primary) hypertension: Secondary | ICD-10-CM | POA: Diagnosis not present

## 2019-03-06 DIAGNOSIS — R55 Syncope and collapse: Secondary | ICD-10-CM | POA: Diagnosis not present

## 2019-03-06 DIAGNOSIS — I700 Atherosclerosis of aorta: Secondary | ICD-10-CM | POA: Diagnosis not present

## 2019-03-06 DIAGNOSIS — E119 Type 2 diabetes mellitus without complications: Secondary | ICD-10-CM | POA: Diagnosis not present

## 2019-03-10 ENCOUNTER — Telehealth: Payer: Self-pay

## 2019-03-10 ENCOUNTER — Encounter: Payer: Medicare HMO | Admitting: Registered Nurse

## 2019-03-10 DIAGNOSIS — G894 Chronic pain syndrome: Secondary | ICD-10-CM

## 2019-03-10 DIAGNOSIS — M7918 Myalgia, other site: Secondary | ICD-10-CM

## 2019-03-10 MED ORDER — HYDROCODONE-ACETAMINOPHEN 5-325 MG PO TABS: 1.0000 | ORAL_TABLET | Freq: Four times a day (QID) | ORAL | 0 refills | Status: DC | PRN

## 2019-03-10 NOTE — Telephone Encounter (Signed)
PMP was Reviewed. Hydrocodone e-scribed. Office staff will call Ms. Zundel regarding the above.

## 2019-03-10 NOTE — Telephone Encounter (Signed)
Please call in her Hydrocodone ptn being moved out 1 month due to staffing [phone] if any issues

## 2019-03-12 DIAGNOSIS — J189 Pneumonia, unspecified organism: Secondary | ICD-10-CM | POA: Diagnosis not present

## 2019-03-12 DIAGNOSIS — R519 Headache, unspecified: Secondary | ICD-10-CM | POA: Diagnosis not present

## 2019-03-12 DIAGNOSIS — I10 Essential (primary) hypertension: Secondary | ICD-10-CM | POA: Diagnosis not present

## 2019-03-12 DIAGNOSIS — Z79899 Other long term (current) drug therapy: Secondary | ICD-10-CM | POA: Diagnosis not present

## 2019-03-12 DIAGNOSIS — E119 Type 2 diabetes mellitus without complications: Secondary | ICD-10-CM | POA: Diagnosis not present

## 2019-03-12 DIAGNOSIS — E876 Hypokalemia: Secondary | ICD-10-CM | POA: Diagnosis not present

## 2019-03-12 DIAGNOSIS — R05 Cough: Secondary | ICD-10-CM | POA: Diagnosis not present

## 2019-03-12 DIAGNOSIS — U071 COVID-19: Secondary | ICD-10-CM | POA: Diagnosis not present

## 2019-03-30 ENCOUNTER — Encounter: Payer: Medicare HMO | Admitting: Family Medicine

## 2019-03-30 ENCOUNTER — Encounter: Payer: Medicare HMO | Attending: Physical Medicine & Rehabilitation | Admitting: Registered Nurse

## 2019-03-30 ENCOUNTER — Encounter: Payer: Self-pay | Admitting: Registered Nurse

## 2019-03-30 ENCOUNTER — Other Ambulatory Visit: Payer: Self-pay

## 2019-03-30 VITALS — BP 152/75 | HR 92 | Temp 97.5°F | Ht 68.0 in | Wt 188.0 lb

## 2019-03-30 DIAGNOSIS — I10 Essential (primary) hypertension: Secondary | ICD-10-CM | POA: Insufficient documentation

## 2019-03-30 DIAGNOSIS — M545 Low back pain, unspecified: Secondary | ICD-10-CM

## 2019-03-30 DIAGNOSIS — M75102 Unspecified rotator cuff tear or rupture of left shoulder, not specified as traumatic: Secondary | ICD-10-CM

## 2019-03-30 DIAGNOSIS — M5030 Other cervical disc degeneration, unspecified cervical region: Secondary | ICD-10-CM | POA: Insufficient documentation

## 2019-03-30 DIAGNOSIS — R102 Pelvic and perineal pain: Secondary | ICD-10-CM | POA: Diagnosis not present

## 2019-03-30 DIAGNOSIS — F329 Major depressive disorder, single episode, unspecified: Secondary | ICD-10-CM | POA: Diagnosis not present

## 2019-03-30 DIAGNOSIS — G894 Chronic pain syndrome: Secondary | ICD-10-CM | POA: Insufficient documentation

## 2019-03-30 DIAGNOSIS — G2581 Restless legs syndrome: Secondary | ICD-10-CM

## 2019-03-30 DIAGNOSIS — M542 Cervicalgia: Secondary | ICD-10-CM | POA: Diagnosis not present

## 2019-03-30 DIAGNOSIS — T1490XA Injury, unspecified, initial encounter: Secondary | ICD-10-CM | POA: Diagnosis not present

## 2019-03-30 DIAGNOSIS — E119 Type 2 diabetes mellitus without complications: Secondary | ICD-10-CM | POA: Diagnosis not present

## 2019-03-30 DIAGNOSIS — Z79891 Long term (current) use of opiate analgesic: Secondary | ICD-10-CM | POA: Diagnosis not present

## 2019-03-30 DIAGNOSIS — F4310 Post-traumatic stress disorder, unspecified: Secondary | ICD-10-CM | POA: Insufficient documentation

## 2019-03-30 DIAGNOSIS — M25512 Pain in left shoulder: Secondary | ICD-10-CM | POA: Insufficient documentation

## 2019-03-30 DIAGNOSIS — Z5181 Encounter for therapeutic drug level monitoring: Secondary | ICD-10-CM

## 2019-03-30 DIAGNOSIS — G8929 Other chronic pain: Secondary | ICD-10-CM | POA: Diagnosis not present

## 2019-03-30 DIAGNOSIS — E785 Hyperlipidemia, unspecified: Secondary | ICD-10-CM | POA: Insufficient documentation

## 2019-03-30 DIAGNOSIS — M5412 Radiculopathy, cervical region: Secondary | ICD-10-CM

## 2019-03-30 DIAGNOSIS — M7918 Myalgia, other site: Secondary | ICD-10-CM

## 2019-03-30 DIAGNOSIS — M47812 Spondylosis without myelopathy or radiculopathy, cervical region: Secondary | ICD-10-CM

## 2019-03-30 MED ORDER — HYDROCODONE-ACETAMINOPHEN 5-325 MG PO TABS: 1.0000 | ORAL_TABLET | Freq: Four times a day (QID) | ORAL | 0 refills | Status: DC | PRN
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
## 2019-04-03 ENCOUNTER — Encounter: Payer: Self-pay | Admitting: Registered Nurse

## 2019-04-04 ENCOUNTER — Other Ambulatory Visit: Payer: Self-pay

## 2019-04-04 ENCOUNTER — Ambulatory Visit (INDEPENDENT_AMBULATORY_CARE_PROVIDER_SITE_OTHER): Payer: Medicare HMO | Admitting: Family Medicine

## 2019-04-04 ENCOUNTER — Encounter: Payer: Self-pay | Admitting: Family Medicine

## 2019-04-04 VITALS — BP 130/80 | HR 96 | Temp 96.2°F | Resp 16 | Ht 68.0 in | Wt 189.0 lb

## 2019-04-04 DIAGNOSIS — F32A Depression, unspecified: Secondary | ICD-10-CM

## 2019-04-04 DIAGNOSIS — Z0000 Encounter for general adult medical examination without abnormal findings: Secondary | ICD-10-CM | POA: Diagnosis not present

## 2019-04-04 DIAGNOSIS — I700 Atherosclerosis of aorta: Secondary | ICD-10-CM

## 2019-04-04 DIAGNOSIS — E785 Hyperlipidemia, unspecified: Secondary | ICD-10-CM

## 2019-04-04 DIAGNOSIS — M791 Myalgia, unspecified site: Secondary | ICD-10-CM | POA: Diagnosis not present

## 2019-04-04 DIAGNOSIS — F419 Anxiety disorder, unspecified: Secondary | ICD-10-CM

## 2019-04-04 DIAGNOSIS — T466X5A Adverse effect of antihyperlipidemic and antiarteriosclerotic drugs, initial encounter: Secondary | ICD-10-CM

## 2019-04-04 DIAGNOSIS — J189 Pneumonia, unspecified organism: Secondary | ICD-10-CM

## 2019-04-04 DIAGNOSIS — E876 Hypokalemia: Secondary | ICD-10-CM

## 2019-04-04 DIAGNOSIS — E118 Type 2 diabetes mellitus with unspecified complications: Secondary | ICD-10-CM | POA: Diagnosis not present

## 2019-04-04 DIAGNOSIS — F329 Major depressive disorder, single episode, unspecified: Secondary | ICD-10-CM

## 2019-04-04 DIAGNOSIS — E1169 Type 2 diabetes mellitus with other specified complication: Secondary | ICD-10-CM | POA: Diagnosis not present

## 2019-04-04 DIAGNOSIS — I10 Essential (primary) hypertension: Secondary | ICD-10-CM | POA: Diagnosis not present

## 2019-04-04 DIAGNOSIS — G2581 Restless legs syndrome: Secondary | ICD-10-CM

## 2019-04-04 LAB — COMPREHENSIVE METABOLIC PANEL
ALT: 15 U/L (ref 0–35)
AST: 19 U/L (ref 0–37)
Albumin: 4.1 g/dL (ref 3.5–5.2)
Alkaline Phosphatase: 61 U/L (ref 39–117)
BUN: 11 mg/dL (ref 6–23)
CO2: 30 mEq/L (ref 19–32)
Calcium: 9 mg/dL (ref 8.4–10.5)
Chloride: 103 mEq/L (ref 96–112)
Creatinine, Ser: 0.82 mg/dL (ref 0.40–1.20)
GFR: 69.48 mL/min (ref >60.00)
Glucose, Bld: 144 mg/dL — ABNORMAL HIGH (ref 70–99)
Potassium: 3.8 mEq/L (ref 3.5–5.1)
Sodium: 139 mEq/L (ref 135–145)
Total Bilirubin: 0.6 mg/dL (ref 0.2–1.2)
Total Protein: 7 g/dL (ref 6.0–8.3)

## 2019-04-04 LAB — LDL CHOLESTEROL, DIRECT: Direct LDL: 94 mg/dL

## 2019-04-04 LAB — LIPID PANEL
Cholesterol: 233 mg/dL — ABNORMAL HIGH (ref 0–200)
HDL: 37.2 mg/dL — ABNORMAL LOW (ref >39.00)
Total CHOL/HDL Ratio: 6
Triglycerides: 464 mg/dL — ABNORMAL HIGH (ref 0.0–149.0)

## 2019-04-04 LAB — MICROALBUMIN / CREATININE URINE RATIO
Creatinine,U: 102.3 mg/dL
Microalb Creat Ratio: 0.7 mg/g (ref 0.0–30.0)
Microalb, Ur: <0.7 mg/dL (ref 0.0–1.9)

## 2019-04-04 LAB — HEMOGLOBIN A1C: Hgb A1c MFr Bld: 6.9 % — ABNORMAL HIGH (ref 4.6–6.5)

## 2019-04-04 NOTE — Progress Notes (Signed)
HPI:   Ms.Loni Daleisa Halperin is a 68 y.o. female, who is here today for her routine physical.  Last CPE: > 1 year ago.  Regular exercise 3 or more time per week: She is walking around her appt and more active. Following a healthy diet: Yes, cooking at home and eating healthier. She has lost some wt. She lives alone.  Chronic medical problems: DM II,HLD,HTN,anxiety,depression,and chronic pain among some.  Pap smear 2018. Hx of abnormal pap smears: Negative.  Immunization History  Administered Date(s) Administered  . Influenza Split 01/28/2012, 04/07/2012  . Influenza, Quadrivalent, Recombinant, Inj, Pf 12/18/2018  . Influenza,inj,Quad PF,6+ Mos 12/29/2016  . Influenza-Unspecified 02/03/2013  . Pneumococcal Conjugate-13 01/13/2017  . Pneumococcal Polysaccharide-23 11/17/2018  . Zoster 03/15/2015, 03/24/2015  . Zoster Recombinat (Shingrix) 12/10/2018    Mammogram: 12/2016. She does not feel comfortable having it done during COVID 19 pandemia. Colonoscopy: 02/18/2017. DEXA: Not done before.  Hep C screening: 09/11/2015 NR.  She has some concerns today.   Lab Results  Component Value Date   CREATININE 0.82 09/22/2018   BUN 14 09/22/2018   NA 138 09/22/2018   K 3.9 09/22/2018   CL 99 09/22/2018   CO2 29 09/22/2018   DM II: Negative for polydipsia,polyuria, or polyphagia. She is on Trulicity 1.5 mg weekly.  Lab Results  Component Value Date   HGBA1C 7.9 (H) 09/22/2018  BS's 120's-130's.  Since her last visit she was evaluated in the ER due to respiratory symptoms and not feeling well, 03/06/19 and 03/12/19 respectively. Dx'ed with hypoK+  She is on KCL 20 meq daily.  She was also dehydrated. 03/06/19: CXR and flu test neg,. 03/12/19: CXR showed pneumonia IVFabx in the ER and discharged on Azithromycin. Oral Methylprednisolone was also recommended but she did not take it, had just completed oral Prednisone.  Symptoms have resolved.  HLD: She is on non  pharmacologic treatment. She has tried statin therapy but discontinued because aggravated arthralgias and caused myalgias.  Lab Results  Component Value Date   CHOL 183 12/16/2016   HDL 53 12/16/2016   LDLCALC 84 12/16/2016   TRIG 230 (H) 12/16/2016   CHOLHDL 3.5 12/16/2016   Anxiety and depression: She has felt much better since she moved to ehr new place. She is on Clonazepam 1 mg bid. Doxepin 10 mg at bedtime helps her sleep.  HTN: On HCTZ 25 mg daily. Negative for chest pain, dyspnea, palpitation, claudication, or edema.  Aortic arthrosclerosis seen on imaging.  Review of Systems  Constitutional: Positive for fatigue. Negative for appetite change, chills and fever.  HENT: Negative for hearing loss, mouth sores and sore throat.   Eyes: Negative for photophobia and visual disturbance.  Respiratory: Negative for cough and wheezing.   Cardiovascular: Negative for leg swelling.  Gastrointestinal: Negative for abdominal pain, nausea and vomiting.       No changes in bowel habits.  Endocrine: Negative for cold intolerance and heat intolerance.  Genitourinary: Negative for decreased urine volume, dysuria, hematuria, vaginal bleeding and vaginal discharge.  Musculoskeletal: Positive for arthralgias. Negative for gait problem.  Skin: Negative for rash and wound.  Allergic/Immunologic: Positive for environmental allergies.  Neurological: Positive for numbness (Feet, neuropathy). Negative for syncope, weakness and headaches.  Hematological: Negative for adenopathy. Does not bruise/bleed easily.  Psychiatric/Behavioral: Positive for sleep disturbance. Negative for confusion and hallucinations. The patient is nervous/anxious.   All other systems reviewed and are negative.  Current Outpatient Medications on File Prior to Visit  Medication Sig Dispense Refill  . ACCU-CHEK SOFTCLIX LANCETS lancets Use to test blood sugar 3 times daily. Dx: E11.8 100 each 12  . albuterol (VENTOLIN HFA) 108  (90 Base) MCG/ACT inhaler INHALE 2 PUFFS INTO THE LUNGS EVERY 6 HOURS AS NEEDED FOR WHEEZING OR SHORTNESS OF BREATH 54 g 0  . Alcohol Swabs (B-D SINGLE USE SWABS REGULAR) PADS Use to test blood sugar 3 times daily 100 each 3  . Blood Glucose Calibration (ACCU-CHEK AVIVA) SOLN Use as directed 1 each 2  . Blood Glucose Monitoring Suppl (ACCU-CHEK AVIVA PLUS) w/Device KIT Use to check blood sugar 3 times daily 1 kit 1  . clonazePAM (KLONOPIN) 1 MG tablet TAKE 1 TABLET(1 MG) BY MOUTH TWICE DAILY AS NEEDED FOR ANXIETY. CONTINUE WEANING OFF AS DIRECTED 60 tablet 3  . cyanocobalamin 100 MCG tablet Take 100 mcg by mouth daily.    Marland Kitchen doxepin (SINEQUAN) 10 MG capsule Take 1 capsule (10 mg total) by mouth at bedtime. 90 capsule 0  . Dulaglutide (TRULICITY) 1.5 GQ/9.1QX SOPN Inject 1.5 mg into the skin once a week. 13 pen 3  . glucose blood (ACCU-CHEK AVIVA) test strip Use to test blood sugar 3 times daily. Dx: 11.8 100 each 12  . hydrochlorothiazide (HYDRODIURIL) 25 MG tablet TAKE 1 TABLET EVERY DAY 90 tablet 2  . HYDROcodone-acetaminophen (NORCO/VICODIN) 5-325 MG tablet Take 1 tablet by mouth every 6 (six) hours as needed for moderate pain. 110 tablet 0  . ipratropium (ATROVENT) 0.06 % nasal spray Place 2 sprays into both nostrils 4 (four) times daily. 15 mL 3  . multivitamin-lutein (OCUVITE-LUTEIN) CAPS capsule Take 1 capsule by mouth daily.    Marland Kitchen omeprazole (PRILOSEC) 40 MG capsule Take 1 capsule (40 mg total) by mouth daily. 90 capsule 3   No current facility-administered medications on file prior to visit.     Past Medical History:  Diagnosis Date  . Anxiety   . Arthritis   . Diabetes mellitus without complication ([place])   . Hyperlipidemia   . Hypertension   . PTSD (post-traumatic stress disorder)     Past Surgical History:  Procedure Laterality Date  . ACHILLES TENDON SURGERY Left   . BACK SURGERY    . BLADDER SUSPENSION    . CHOLECYSTECTOMY    . NOSE SURGERY     x 2   . OTHER SURGICAL  HISTORY     Breast Duct removed  . PARTIAL HYSTERECTOMY    . RECTOCELE REPAIR    . VEIN SURGERY Right    leg    Allergies  Allergen Reactions  . Hydromorphone Nausea And Vomiting and Other (See Comments)    Made the patient feel "spaced out," also Made the patient feel "spaced out," also  . Morphine And Related Itching  . Other Itching  . Penicillins Rash    Has patient had a PCN reaction causing immediate rash, facial/tongue/throat swelling, SOB or lightheadedness with hypotension: Yes Has patient had a PCN reaction causing severe rash involving mucus membranes or skin necrosis: No Has patient had a PCN reaction that required hospitalization: No Has patient had a PCN reaction occurring within the last 10 years: No If all of the above answers are "NO", then may proceed with Cephalosporin use.     Family History  Problem Relation Age of Onset  . Diabetes Mother   . Alzheimer's disease Mother   . Lung cancer Father   . Esophageal cancer Brother   . Colon cancer Neg Hx  Social History   Socioeconomic History  . Marital status: Divorced    Spouse name: Not on file  . Number of children: Not on file  . Years of education: Not on file  . Highest education level: Not on file  Occupational History  . Not on file  Tobacco Use  . Smoking status: Never Smoker  . Smokeless tobacco: Never Used  Substance and Sexual Activity  . Alcohol use: No  . Drug use: No  . Sexual activity: Not on file  Other Topics Concern  . Not on file  Social History Narrative  . Not on file   Social Determinants of Health   Financial Resource Strain:   . Difficulty of Paying Living Expenses: Not on file  Food Insecurity:   . Worried About Charity fundraiser in the Last Year: Not on file  . Ran Out of Food in the Last Year: Not on file  Transportation Needs:   . Lack of Transportation (Medical): Not on file  . Lack of Transportation (Non-Medical): Not on file  Physical Activity:   .  Days of Exercise per Week: Not on file  . Minutes of Exercise per Session: Not on file  Stress:   . Feeling of Stress : Not on file  Social Connections:   . Frequency of Communication with Friends and Family: Not on file  . Frequency of Social Gatherings with Friends and Family: Not on file  . Attends Religious Services: Not on file  . Active Member of Clubs or Organizations: Not on file  . Attends Archivist Meetings: Not on file  . Marital Status: Not on file    Vitals:   04/04/19 1012  BP: 130/80  Pulse: 96  Resp: 16  Temp: (!) 96.2 F (35.7 C)  SpO2: 95%   Body mass index is 28.74 kg/m.   Wt Readings from Last 3 Encounters:  04/04/19 189 lb (85.7 kg)  03/30/19 188 lb (85.3 kg)  01/10/19 195 lb (88.5 kg)    Physical Exam  Nursing note and vitals reviewed. Constitutional: She is oriented to person, place, and time. She appears well-developed. No distress.  HENT:  Head: Normocephalic and atraumatic.  Right Ear: Hearing, tympanic membrane, external ear and ear canal normal.  Left Ear: Hearing, tympanic membrane, external ear and ear canal normal.  Mouth/Throat: Uvula is midline, oropharynx is clear and moist and mucous membranes are normal.  Eyes: Pupils are equal, round, and reactive to light. Conjunctivae and EOM are normal.  Neck: No tracheal deviation present. No thyromegaly present.  Cardiovascular: Normal rate and regular rhythm.  No murmur heard. Pulses:      Dorsalis pedis pulses are 2+ on the right side and 2+ on the left side.  Respiratory: Effort normal and breath sounds normal. No respiratory distress. No breast swelling or tenderness.  GI: Soft. She exhibits no mass. There is no hepatomegaly. There is no abdominal tenderness.  Genitourinary:    Genitourinary Comments: Breast: No masses,skin abnormalities,or nipple discharge.   Musculoskeletal:        General: No edema.     Comments: No signs of synovitis appreciated.  Lymphadenopathy:     She has no cervical adenopathy.    She has no axillary adenopathy.       Right: No supraclavicular adenopathy present.       Left: No supraclavicular adenopathy present.  Neurological: She is alert and oriented to person, place, and time. She has normal strength. No cranial nerve  deficit. Coordination and gait normal.  Reflex Scores:      Bicep reflexes are 2+ on the right side and 2+ on the left side.      Patellar reflexes are 2+ on the right side and 2+ on the left side. Skin: Skin is warm. No rash noted. No erythema.  Psychiatric: She has a normal mood and affect. Cognition and memory are normal.  Well groomed, good eye contact.   ASSESSMENT AND PLAN:  Ms. Adwoa Axe Hensler was here today annual physical examination.   Orders Placed This Encounter  Procedures  . Microalbumin / creatinine urine ratio  . Comprehensive metabolic panel  . Lipid panel  . Hemoglobin A1c  . LDL cholesterol, direct    Lab Results  Component Value Date   HGBA1C 6.9 (H) 04/04/2019   Lab Results  Component Value Date   CREATININE 0.82 04/04/2019   BUN 11 04/04/2019   NA 139 04/04/2019   K 3.8 04/04/2019   CL 103 04/04/2019   CO2 30 04/04/2019   Lab Results  Component Value Date   ALT 15 04/04/2019   AST 19 04/04/2019   ALKPHOS 61 04/04/2019   BILITOT 0.6 04/04/2019   Lab Results  Component Value Date   MICROALBUR <0.7 04/04/2019   Routine general medical examination at a health care facility We discussed the importance of regular physical activity and healthy diet for prevention of chronic illness and/or complications. Preventive guidelines reviewed. Vaccination up to date. DEXA to be done with next mammogram. She is getting 2nd shingrix at her pharmacy. Ca++ and vit D supplementation recommended. Next CPE in a year.  Atherosclerosis of aorta (HCC) Seen in imaging. Discussed dx. Aspirin 81 mg daily recommended. Statin intolerance.  Type 2 diabetes mellitus with complication,  without long-term current use of insulin ([place]) HgA1C was not at goal in 09/2018. No changes in current management, will adjust treatment according to HgA1C. Regular exercise and healthy diet with avoidance of added sugar food intake is an important part of treatment. Annual eye exam, periodic dental and foot care recommended. F/U in 5-6 months.  Anxiety and depression Better controlled. No changes in current management.  Essential hypertension BP adequately controlled. No changes in HCTZ for now but if hypoK+ is persistent we will need to consider other antihypertensive. Continue low salt diet.  HYPOKALEMIA No changes in current management, will follow labs done today and will give further recommendations accordingly.  Myalgia due to statin Improved when stain was d/c.  Hyperlipidemia associated with type 2 diabetes mellitus ([place]) Continue non pharmacologic treatment. LDL goal < 100, ideally < 70.  Pneumonia, unspecified organism Resolved. I do not think CXR is needed given the face she has clinically improve and lung exam today negative.  Other orders -     LDL cholesterol, direct    Return in 5 months (on 09/02/2019).    Amilyah Nack G. Martinique, MD  [facility]. [place] office.

## 2019-04-04 NOTE — Patient Instructions (Addendum)
Today you have you routine preventive visit. A few things to remember from today's visit:   Atherosclerosis of aorta ([place])  Type 2 diabetes mellitus with complication, without long-term current use of insulin ([place]) - Plan: Microalbumin / creatinine urine ratio, Comprehensive metabolic panel, Hemoglobin A1c  Anxiety and depression  Essential hypertension  HYPOKALEMIA  Myalgia due to statin  Hyperlipidemia associated with type 2 diabetes mellitus ([place]) - Plan: Lipid panel  Routine general medical examination at a health care facility  Pneumonia, unspecified organism  Start Aspirin 81 mg daily. Rest unchanged.  At least 150 minutes of moderate exercise per week, daily brisk walking for 15-30 min is a good exercise option. Healthy diet low in saturated (animal) fats and sweets and consisting of fresh fruits and vegetables, lean meats such as fish and white chicken and whole grains.  These are some of recommendations for screening depending of age and risk factors:   - Vaccines:  Tdap vaccine every 10 years.  Shingles vaccine recommended at age 39, could be given after 67 years of age but not sure about insurance coverage.   Pneumonia vaccines:  Pneumovax at 22. Sometimes Pneumovax is giving earlier if history of smoking, lung disease,diabetes,kidney disease among some.  Cervical cancer prevention:  N/A.  -Breast cancer: Mammogram: There is disagreement between experts about when to start screening in low risk asymptomatic female but recent recommendations are to start screening at 54 and not later than 67 years old , every 1-2 years and after 67 yo q 2 years. Screening is recommended until 67 years old but some women can continue screening depending of healthy issues.   Colon cancer screening: starts at 67 years old until 67 years old.  Also recommended:  1. Dental visit- Brush and floss your teeth twice daily; visit your dentist twice a year. 2. Eye doctor- Get an eye  exam at least every 2 years. 3. Helmet use- Always wear a helmet when riding a bicycle, motorcycle, rollerblading or skateboarding. 4. Safe sex- If you may be exposed to sexually transmitted infections, use a condom. 5. Seat belts- Seat belts can save your live; always wear one. 6. Smoke/Carbon Monoxide detectors- These detectors need to be installed on the appropriate level of your home. Replace batteries at least once a year. 7. Skin cancer- When out in the sun please cover up and use sunscreen 15 SPF or higher. 8. Violence- If anyone is threatening or hurting you, please tell your healthcare provider.  9. Drink alcohol in moderation- Limit alcohol intake to one drink or less per day. Never drink and drive.

## 2019-04-05 MED ORDER — GABAPENTIN 400 MG PO CAPS: 400.0000 mg | ORAL_CAPSULE | Freq: Three times a day (TID) | ORAL | 3 refills | Status: DC

## 2019-04-11 ENCOUNTER — Ambulatory Visit: Payer: Medicare HMO | Admitting: Registered Nurse

## 2019-04-11 ENCOUNTER — Other Ambulatory Visit: Payer: Self-pay

## 2019-04-11 MED ORDER — FENOFIBRATE 145 MG PO TABS: 145.0000 mg | ORAL_TABLET | Freq: Every day | ORAL | 3 refills | Status: DC

## 2019-04-15 ENCOUNTER — Encounter: Payer: Self-pay | Admitting: Family Medicine

## 2019-04-18 ENCOUNTER — Encounter: Payer: Self-pay | Admitting: Family Medicine

## 2019-04-19 ENCOUNTER — Other Ambulatory Visit: Payer: Self-pay | Admitting: Family Medicine

## 2019-04-19 ENCOUNTER — Encounter: Payer: Self-pay | Admitting: Family Medicine

## 2019-04-19 DIAGNOSIS — E876 Hypokalemia: Secondary | ICD-10-CM

## 2019-04-19 MED ORDER — POTASSIUM CHLORIDE ER 10 MEQ PO TBCR: 10.0000 meq | EXTENDED_RELEASE_TABLET | Freq: Every day | ORAL | 0 refills | Status: DC

## 2019-05-12 ENCOUNTER — Other Ambulatory Visit: Payer: Self-pay

## 2019-05-12 ENCOUNTER — Encounter: Payer: Self-pay | Admitting: Registered Nurse

## 2019-05-12 ENCOUNTER — Encounter: Payer: Medicare HMO | Attending: Physical Medicine & Rehabilitation | Admitting: Registered Nurse

## 2019-05-12 VITALS — BP 153/75 | HR 88 | Ht 68.0 in | Wt 189.0 lb

## 2019-05-12 DIAGNOSIS — Z5181 Encounter for therapeutic drug level monitoring: Secondary | ICD-10-CM | POA: Diagnosis not present

## 2019-05-12 DIAGNOSIS — Z79899 Other long term (current) drug therapy: Secondary | ICD-10-CM | POA: Diagnosis not present

## 2019-05-12 DIAGNOSIS — M47812 Spondylosis without myelopathy or radiculopathy, cervical region: Secondary | ICD-10-CM

## 2019-05-12 DIAGNOSIS — G8929 Other chronic pain: Secondary | ICD-10-CM | POA: Diagnosis not present

## 2019-05-12 DIAGNOSIS — F329 Major depressive disorder, single episode, unspecified: Secondary | ICD-10-CM | POA: Insufficient documentation

## 2019-05-12 DIAGNOSIS — G2581 Restless legs syndrome: Secondary | ICD-10-CM | POA: Diagnosis not present

## 2019-05-12 DIAGNOSIS — M25512 Pain in left shoulder: Secondary | ICD-10-CM | POA: Diagnosis not present

## 2019-05-12 DIAGNOSIS — G894 Chronic pain syndrome: Secondary | ICD-10-CM | POA: Diagnosis not present

## 2019-05-12 DIAGNOSIS — M545 Low back pain, unspecified: Secondary | ICD-10-CM

## 2019-05-12 DIAGNOSIS — T1490XA Injury, unspecified, initial encounter: Secondary | ICD-10-CM | POA: Insufficient documentation

## 2019-05-12 DIAGNOSIS — M7918 Myalgia, other site: Secondary | ICD-10-CM | POA: Insufficient documentation

## 2019-05-12 DIAGNOSIS — E119 Type 2 diabetes mellitus without complications: Secondary | ICD-10-CM | POA: Diagnosis not present

## 2019-05-12 DIAGNOSIS — M5412 Radiculopathy, cervical region: Secondary | ICD-10-CM | POA: Diagnosis not present

## 2019-05-12 DIAGNOSIS — M5030 Other cervical disc degeneration, unspecified cervical region: Secondary | ICD-10-CM | POA: Insufficient documentation

## 2019-05-12 DIAGNOSIS — M75102 Unspecified rotator cuff tear or rupture of left shoulder, not specified as traumatic: Secondary | ICD-10-CM | POA: Diagnosis not present

## 2019-05-12 DIAGNOSIS — F4310 Post-traumatic stress disorder, unspecified: Secondary | ICD-10-CM | POA: Diagnosis not present

## 2019-05-12 DIAGNOSIS — I10 Essential (primary) hypertension: Secondary | ICD-10-CM | POA: Diagnosis not present

## 2019-05-12 DIAGNOSIS — R102 Pelvic and perineal pain: Secondary | ICD-10-CM | POA: Diagnosis not present

## 2019-05-12 DIAGNOSIS — M542 Cervicalgia: Secondary | ICD-10-CM | POA: Insufficient documentation

## 2019-05-12 DIAGNOSIS — E785 Hyperlipidemia, unspecified: Secondary | ICD-10-CM | POA: Diagnosis not present

## 2019-05-12 MED ORDER — HYDROCODONE-ACETAMINOPHEN 5-325 MG PO TABS: 1.0000 | ORAL_TABLET | Freq: Four times a day (QID) | ORAL | 0 refills | Status: DC | PRN
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
## 2019-05-13 DIAGNOSIS — M8589 Other specified disorders of bone density and structure, multiple sites: Secondary | ICD-10-CM | POA: Diagnosis not present

## 2019-05-13 DIAGNOSIS — Z1231 Encounter for screening mammogram for malignant neoplasm of breast: Secondary | ICD-10-CM | POA: Diagnosis not present

## 2019-05-16 ENCOUNTER — Encounter: Payer: Self-pay | Admitting: Family Medicine

## 2019-05-17 ENCOUNTER — Other Ambulatory Visit: Payer: Self-pay | Admitting: Family Medicine

## 2019-05-17 LAB — DRUG TOX MONITOR 1 W/CONF, ORAL FLD
Alprazolam: NEGATIVE ng/mL (ref <0.50)
Amphetamines: NEGATIVE ng/mL (ref <10)
Barbiturates: NEGATIVE ng/mL (ref <10)
Benzodiazepines: POSITIVE ng/mL — AB (ref <0.50)
Buprenorphine: NEGATIVE ng/mL (ref <0.10)
Chlordiazepoxide: NEGATIVE ng/mL (ref <0.50)
Clonazepam: 0.65 ng/mL — ABNORMAL HIGH (ref <0.50)
Cocaine: NEGATIVE ng/mL (ref <5.0)
Diazepam: NEGATIVE ng/mL (ref <0.50)
Fentanyl: NEGATIVE ng/mL (ref <0.10)
Flunitrazepam: NEGATIVE ng/mL (ref <0.50)
Flurazepam: NEGATIVE ng/mL (ref <0.50)
Heroin Metabolite: NEGATIVE ng/mL (ref <1.0)
Lorazepam: NEGATIVE ng/mL (ref <0.50)
MARIJUANA: NEGATIVE ng/mL (ref <2.5)
MDMA: NEGATIVE ng/mL (ref <10)
Meprobamate: NEGATIVE ng/mL (ref <2.5)
Methadone: NEGATIVE ng/mL (ref <5.0)
Midazolam: NEGATIVE ng/mL (ref <0.50)
Nicotine Metabolite: NEGATIVE ng/mL (ref <5.0)
Nordiazepam: NEGATIVE ng/mL (ref <0.50)
Opiates: NEGATIVE ng/mL (ref <2.5)
Oxazepam: NEGATIVE ng/mL (ref <0.50)
Phencyclidine: NEGATIVE ng/mL (ref <10)
Tapentadol: NEGATIVE ng/mL (ref <5.0)
Temazepam: NEGATIVE ng/mL (ref <0.50)
Tramadol: NEGATIVE ng/mL (ref <5.0)
Triazolam: NEGATIVE ng/mL (ref <0.50)
Zolpidem: NEGATIVE ng/mL (ref <5.0)

## 2019-05-17 LAB — DRUG TOX ALC METAB W/CON, ORAL FLD: Alcohol Metabolite: NEGATIVE ng/mL (ref <25)

## 2019-05-17 MED ORDER — TRULICITY 1.5 MG/0.5ML ~~LOC~~ SOAJ: 1.5000 mg | SUBCUTANEOUS | 3 refills | Status: DC

## 2019-05-19 DIAGNOSIS — R922 Inconclusive mammogram: Secondary | ICD-10-CM | POA: Diagnosis not present

## 2019-05-19 DIAGNOSIS — N6311 Unspecified lump in the right breast, upper outer quadrant: Secondary | ICD-10-CM | POA: Diagnosis not present

## 2019-05-19 LAB — HM MAMMOGRAPHY

## 2019-05-20 ENCOUNTER — Telehealth: Payer: Self-pay

## 2019-05-20 NOTE — Telephone Encounter (Signed)
UDS QUEST NOT CONSISTENT WITH MEDICATION / HEALTH AND HISTORY PLEASE REVIEW

## 2019-05-24 ENCOUNTER — Encounter: Payer: Self-pay | Admitting: Family Medicine

## 2019-05-25 ENCOUNTER — Encounter: Payer: Self-pay | Admitting: Family Medicine

## 2019-05-27 ENCOUNTER — Encounter: Payer: Self-pay | Admitting: Family Medicine

## 2019-05-27 ENCOUNTER — Other Ambulatory Visit: Payer: Self-pay | Admitting: Family Medicine

## 2019-05-30 ENCOUNTER — Encounter: Payer: Self-pay | Admitting: Family Medicine

## 2019-05-30 MED ORDER — HYDROCHLOROTHIAZIDE 25 MG PO TABS: 25.0000 mg | ORAL_TABLET | Freq: Every day | ORAL | 3 refills | Status: DC

## 2019-05-30 MED ORDER — OMEPRAZOLE 40 MG PO CPDR: 40.0000 mg | DELAYED_RELEASE_CAPSULE | Freq: Every day | ORAL | 3 refills | Status: DC

## 2019-06-02 ENCOUNTER — Telehealth (INDEPENDENT_AMBULATORY_CARE_PROVIDER_SITE_OTHER): Payer: Medicare HMO | Admitting: Family Medicine

## 2019-06-02 ENCOUNTER — Encounter: Payer: Self-pay | Admitting: Family Medicine

## 2019-06-02 ENCOUNTER — Other Ambulatory Visit: Payer: Self-pay

## 2019-06-02 DIAGNOSIS — T7840XA Allergy, unspecified, initial encounter: Secondary | ICD-10-CM

## 2019-06-02 DIAGNOSIS — Z8616 Personal history of COVID-19: Secondary | ICD-10-CM | POA: Diagnosis not present

## 2019-06-02 DIAGNOSIS — R05 Cough: Secondary | ICD-10-CM | POA: Diagnosis not present

## 2019-06-02 DIAGNOSIS — R059 Cough, unspecified: Secondary | ICD-10-CM

## 2019-06-02 MED ORDER — BENZONATATE 100 MG PO CAPS: 100.0000 mg | ORAL_CAPSULE | Freq: Three times a day (TID) | ORAL | 0 refills | Status: DC | PRN

## 2019-06-02 NOTE — Progress Notes (Signed)
Virtual Visit via Video Note  I connected with [name]  on 06/02/19 at  1:00 PM EST by a video enabled telemedicine application and verified that I am speaking with the correct person using two identifiers.  Location patient: home Location provider:work or home office Persons participating in the virtual visit: patient, provider  I discussed the limitations of evaluation and management by telemedicine and the availability of in person appointments. The patient expressed understanding and agreed to proceed.   HPI:  Acute visit for Cough: -started: 2 days ago -symptoms include: mild cough, sinus congestion, runny nose - she has chronic nasal symptoms at baseline per her report with allergies -reports today feels great - she is just very anxious about the cough as got so sick in Nov-Dec (see below) -denies:fevers, HA, SOB, VD, body aches, CP, wheezing, thick drainage or congestion, fatigue, malaise -had COVID19 pneumonia in Nov-December; reports she got better from that -denies sick contacts, she does go out to eat with a friend and does eat in the restaurant with one friend - he has had 3 negative tests in the last month  ROS: See pertinent positives and negatives per HPI.  Past Medical History:  Diagnosis Date  . Anxiety   . Arthritis   . Diabetes mellitus without complication ([place])   . Hyperlipidemia   . Hypertension   . PTSD (post-traumatic stress disorder)     Past Surgical History:  Procedure Laterality Date  . ACHILLES TENDON SURGERY Left   . BACK SURGERY    . BLADDER SUSPENSION    . CHOLECYSTECTOMY    . NOSE SURGERY     x 2   . OTHER SURGICAL HISTORY     Breast Duct removed  . PARTIAL HYSTERECTOMY    . RECTOCELE REPAIR    . VEIN SURGERY Right    leg    Family History  Problem Relation Age of Onset  . Diabetes Mother   . Alzheimer's disease Mother   . Lung cancer Father   . Esophageal cancer Brother   . Colon cancer Neg Hx     SOCIAL HX: see  hpi   Current Outpatient Medications:  .  ACCU-CHEK SOFTCLIX LANCETS lancets, Use to test blood sugar 3 times daily. Dx: E11.8, Disp: 100 each, Rfl: 12 .  albuterol (VENTOLIN HFA) 108 (90 Base) MCG/ACT inhaler, INHALE 2 PUFFS INTO THE LUNGS EVERY 6 HOURS AS NEEDED FOR WHEEZING OR SHORTNESS OF BREATH, Disp: 54 g, Rfl: 0 .  Alcohol Swabs (B-D SINGLE USE SWABS REGULAR) PADS, Use to test blood sugar 3 times daily, Disp: 100 each, Rfl: 3 .  Blood Glucose Calibration (ACCU-CHEK AVIVA) SOLN, Use as directed, Disp: 1 each, Rfl: 2 .  Blood Glucose Monitoring Suppl (ACCU-CHEK AVIVA PLUS) w/Device KIT, Use to check blood sugar 3 times daily, Disp: 1 kit, Rfl: 1 .  clonazePAM (KLONOPIN) 1 MG tablet, TAKE 1 TABLET(1 MG) BY MOUTH TWICE DAILY AS NEEDED FOR ANXIETY. CONTINUE WEANING OFF AS DIRECTED, Disp: 60 tablet, Rfl: 3 .  cyanocobalamin 100 MCG tablet, Take 100 mcg by mouth daily., Disp: , Rfl:  .  doxepin (SINEQUAN) 10 MG capsule, Take 1 capsule (10 mg total) by mouth at bedtime., Disp: 90 capsule, Rfl: 0 .  Dulaglutide (TRULICITY) 1.5 PZ/0.2HE SOPN, Inject 1.5 mg into the skin once a week., Disp: 13 pen, Rfl: 3 .  fenofibrate (TRICOR) 145 MG tablet, Take 1 tablet (145 mg total) by mouth daily., Disp: 30 tablet, Rfl: 3 .  gabapentin (NEURONTIN)  400 MG capsule, Take 1 capsule (400 mg total) by mouth 3 (three) times daily., Disp: 90 capsule, Rfl: 3 .  glucose blood (ACCU-CHEK AVIVA) test strip, Use to test blood sugar 3 times daily. Dx: 11.8, Disp: 100 each, Rfl: 12 .  hydrochlorothiazide (HYDRODIURIL) 25 MG tablet, Take 1 tablet (25 mg total) by mouth daily., Disp: 90 tablet, Rfl: 3 .  HYDROcodone-acetaminophen (NORCO/VICODIN) 5-325 MG tablet, Take 1 tablet by mouth every 6 (six) hours as needed for moderate pain., Disp: 110 tablet, Rfl: 0 .  multivitamin-lutein (OCUVITE-LUTEIN) CAPS capsule, Take 1 capsule by mouth daily., Disp: , Rfl:  .  omeprazole (PRILOSEC) 40 MG capsule, Take 1 capsule (40 mg total) by  mouth daily., Disp: 90 capsule, Rfl: 3 .  potassium chloride (KLOR-CON) 10 MEQ tablet, Take 1 tablet (10 mEq total) by mouth daily., Disp: 90 tablet, Rfl: 0 .  benzonatate (TESSALON PERLES) 100 MG capsule, Take 1 capsule (100 mg total) by mouth 3 (three) times daily as needed., Disp: 20 capsule, Rfl: 0  EXAM:  VITALS per patient if applicable:denies fever  GENERAL: alert, oriented, appears well and in no acute distress  HEENT: atraumatic, conjunttiva clear, no obvious abnormalities on inspection of external nose and ears  NECK: normal movements of the head and neck  LUNGS: on inspection no signs of respiratory distress, breathing rate appears normal, no obvious gross SOB, gasping or wheezing - normal breathing sounds and no coughing with taking 3 large deep breaths  CV: no obvious cyanosis  MS: moves all visible extremities without noticeable abnormality  PSYCH/NEURO: pleasant and cooperative, no obvious depression or anxiety, speech and thought processing grossly intact  ASSESSMENT AND PLAN:  Discussed the following assessment and plan: > 30 minutes spent on this visit.  Cough  History of 2019 novel coronavirus disease (COVID-19)  Allergy, initial encounter  -we discussed possible serious and likely etiologies, options for evaluation and workup, limitations of telemedicine visit vs in person visit, treatment, treatment risks and precautions. Pt prefers to treat via telemedicine empirically rather then risking or undertaking an in person visit at this moment. Query allergic rhinitis given the recent weather changes vs other. Opted for trial tessalon as needed for cough as she feels this has worked well for cough in the past and also claritin once daily. Advised to still use caution/masks/distancing etc when out and minimizing contact outside of household as able given newer variants of COVID19.  Patient agrees to seek prompt in person care if worsening, new symptoms arise, or if is  not improving with treatment.   I discussed the assessment and treatment plan with the patient. The patient was provided an opportunity to ask questions and all were answered. The patient agreed with the plan and demonstrated an understanding of the instructions.   The patient was advised to call back or seek an in-person evaluation if the symptoms worsen or if the condition fails to improve as anticipated.   Lucretia Kern, DO

## 2019-06-06 ENCOUNTER — Telehealth: Payer: Medicare HMO | Admitting: Family Medicine

## 2019-06-07 ENCOUNTER — Encounter: Payer: Self-pay | Admitting: Family Medicine

## 2019-06-13 ENCOUNTER — Other Ambulatory Visit: Payer: Self-pay | Admitting: Family Medicine

## 2019-06-13 DIAGNOSIS — G4700 Insomnia, unspecified: Secondary | ICD-10-CM

## 2019-06-14 ENCOUNTER — Telehealth (INDEPENDENT_AMBULATORY_CARE_PROVIDER_SITE_OTHER): Payer: Medicare HMO | Admitting: Family Medicine

## 2019-06-14 ENCOUNTER — Encounter: Payer: Self-pay | Admitting: Family Medicine

## 2019-06-14 VITALS — Ht 68.0 in

## 2019-06-14 DIAGNOSIS — H1013 Acute atopic conjunctivitis, bilateral: Secondary | ICD-10-CM

## 2019-06-14 MED ORDER — PAZEO 0.7 % OP SOLN: 1.0000 [drp] | Freq: Every day | OPHTHALMIC | 1 refills | Status: DC

## 2019-06-14 NOTE — Progress Notes (Signed)
Virtual Visit via Video Note   I connected with Jeanette Couey on 06/14/19 by a video enabled telemedicine application and verified that I am speaking with the correct person using two identifiers.  Location patient: home Location provider:work office Persons participating in the virtual visit: patient, provider  I discussed the limitations of evaluation and management by telemedicine and the availability of in person appointments. The patient expressed understanding and agreed to proceed.   HPI: [name] is a 69 yo female with Hx of DM II,HTN, and allergies c/o a days of bilateral eye pruritus,epiphora,and erythema. Afraid for symptoms being caused by "pinkeye." Negative for sick contact. No fever,chills,headache, visual changes,eye pain,photophobia, facial pain, sore throat, nausea, or vomiting. She has no use of OTC medications.  Today problem seems to be slightly better.  She got her COVID 19 vaccine yesterday, she already had eye symptoms by the time she did so.  ROS: See pertinent positives and negatives per HPI.  Past Medical History:  Diagnosis Date  . Anxiety   . Arthritis   . Diabetes mellitus without complication ([place])   . Hyperlipidemia   . Hypertension   . PTSD (post-traumatic stress disorder)     Past Surgical History:  Procedure Laterality Date  . ACHILLES TENDON SURGERY Left   . BACK SURGERY    . BLADDER SUSPENSION    . CHOLECYSTECTOMY    . NOSE SURGERY     x 2   . OTHER SURGICAL HISTORY     Breast Duct removed  . PARTIAL HYSTERECTOMY    . RECTOCELE REPAIR    . VEIN SURGERY Right    leg    Family History  Problem Relation Age of Onset  . Diabetes Mother   . Alzheimer's disease Mother   . Lung cancer Father   . Esophageal cancer Brother   . Colon cancer Neg Hx     Social History   Socioeconomic History  . Marital status: Divorced    Spouse name: Not on file  . Number of children: Not on file  . Years of education: Not on file  . Highest  education level: Not on file  Occupational History  . Not on file  Tobacco Use  . Smoking status: Never Smoker  . Smokeless tobacco: Never Used  Substance and Sexual Activity  . Alcohol use: No  . Drug use: No  . Sexual activity: Not on file  Other Topics Concern  . Not on file  Social History Narrative  . Not on file   Social Determinants of Health   Financial Resource Strain:   . Difficulty of Paying Living Expenses: Not on file  Food Insecurity:   . Worried About Charity fundraiser in the Last Year: Not on file  . Ran Out of Food in the Last Year: Not on file  Transportation Needs:   . Lack of Transportation (Medical): Not on file  . Lack of Transportation (Non-Medical): Not on file  Physical Activity:   . Days of Exercise per Week: Not on file  . Minutes of Exercise per Session: Not on file  Stress:   . Feeling of Stress : Not on file  Social Connections:   . Frequency of Communication with Friends and Family: Not on file  . Frequency of Social Gatherings with Friends and Family: Not on file  . Attends Religious Services: Not on file  . Active Member of Clubs or Organizations: Not on file  . Attends Archivist Meetings: Not  on file  . Marital Status: Not on file  Intimate Partner Violence:   . Fear of Current or Ex-Partner: Not on file  . Emotionally Abused: Not on file  . Physically Abused: Not on file  . Sexually Abused: Not on file   Current Outpatient Medications:  .  ACCU-CHEK SOFTCLIX LANCETS lancets, Use to test blood sugar 3 times daily. Dx: E11.8, Disp: 100 each, Rfl: 12 .  albuterol (VENTOLIN HFA) 108 (90 Base) MCG/ACT inhaler, INHALE 2 PUFFS INTO THE LUNGS EVERY 6 HOURS AS NEEDED FOR WHEEZING OR SHORTNESS OF BREATH, Disp: 54 g, Rfl: 0 .  Alcohol Swabs (B-D SINGLE USE SWABS REGULAR) PADS, Use to test blood sugar 3 times daily, Disp: 100 each, Rfl: 3 .  benzonatate (TESSALON PERLES) 100 MG capsule, Take 1 capsule (100 mg total) by mouth 3 (three)  times daily as needed., Disp: 20 capsule, Rfl: 0 .  Blood Glucose Calibration (ACCU-CHEK AVIVA) SOLN, Use as directed, Disp: 1 each, Rfl: 2 .  Blood Glucose Monitoring Suppl (ACCU-CHEK AVIVA PLUS) w/Device KIT, Use to check blood sugar 3 times daily, Disp: 1 kit, Rfl: 1 .  clonazePAM (KLONOPIN) 1 MG tablet, TAKE 1 TABLET(1 MG) BY MOUTH TWICE DAILY AS NEEDED FOR ANXIETY. CONTINUE WEANING OFF AS DIRECTED, Disp: 60 tablet, Rfl: 3 .  cyanocobalamin 100 MCG tablet, Take 100 mcg by mouth daily., Disp: , Rfl:  .  doxepin (SINEQUAN) 10 MG capsule, Take 1 capsule (10 mg total) by mouth at bedtime., Disp: 90 capsule, Rfl: 0 .  Dulaglutide (TRULICITY) 1.5 XY/5.8PF SOPN, Inject 1.5 mg into the skin once a week., Disp: 13 pen, Rfl: 3 .  fenofibrate (TRICOR) 145 MG tablet, Take 1 tablet (145 mg total) by mouth daily., Disp: 30 tablet, Rfl: 3 .  gabapentin (NEURONTIN) 400 MG capsule, Take 1 capsule (400 mg total) by mouth 3 (three) times daily., Disp: 90 capsule, Rfl: 3 .  glucose blood (ACCU-CHEK AVIVA) test strip, Use to test blood sugar 3 times daily. Dx: 11.8, Disp: 100 each, Rfl: 12 .  hydrochlorothiazide (HYDRODIURIL) 25 MG tablet, Take 1 tablet (25 mg total) by mouth daily., Disp: 90 tablet, Rfl: 3 .  HYDROcodone-acetaminophen (NORCO/VICODIN) 5-325 MG tablet, Take 1 tablet by mouth every 6 (six) hours as needed for moderate pain., Disp: 110 tablet, Rfl: 0 .  multivitamin-lutein (OCUVITE-LUTEIN) CAPS capsule, Take 1 capsule by mouth daily., Disp: , Rfl:  .  omeprazole (PRILOSEC) 40 MG capsule, Take 1 capsule (40 mg total) by mouth daily., Disp: 90 capsule, Rfl: 3 .  potassium chloride (KLOR-CON) 10 MEQ tablet, Take 1 tablet (10 mEq total) by mouth daily., Disp: 90 tablet, Rfl: 0  EXAM: VITALS per patient if applicable:Ht 5' 8" (2.924 m)   BMI 28.74 kg/m   GENERAL: alert, oriented, appears well and in no acute distress  HEENT: atraumatic, eye conjunctiva clear, no obvious abnormalities on inspection of  external nose and ears Mild erythema of tarsal conjunctiva, no purulent drainage,or palpebral edema.  EOM intact.  NECK: normal movements of the head and neck  LUNGS: on inspection no signs of respiratory distress, breathing rate appears normal, no obvious gross SOB, gasping or wheezing  CV: no obvious cyanosis  Jeanette: moves all visible extremities without noticeable abnormality  PSYCH/NEURO: pleasant and cooperative, no obvious depression,+ anxious. Speech and thought processing grossly intact  ASSESSMENT AND PLAN:  Discussed the following assessment and plan:  Allergic conjunctivitis of both eyes - Plan: Olopatadine HCl (PAZEO) 0.7 % SOLN  History  does not suggest bacterial conjunctivitis. Most likely allergic etiology. Topical antihistaminic recommended. Warm compresses may also help. Monitor for new or worsening symptoms. Instructed about warning signs.   I discussed the assessment and treatment plan with the patient. Jeanette Matthes was provided an opportunity to ask questions and all were answered. She agreed with the plan and demonstrated an understanding of the instructions.    Return if symptoms worsen or fail to improve.   Calianne Larue Martinique, MD

## 2019-06-23 ENCOUNTER — Other Ambulatory Visit: Payer: Self-pay | Admitting: Family Medicine

## 2019-06-23 DIAGNOSIS — F329 Major depressive disorder, single episode, unspecified: Secondary | ICD-10-CM

## 2019-06-23 DIAGNOSIS — F32A Depression, unspecified: Secondary | ICD-10-CM

## 2019-06-23 DIAGNOSIS — F419 Anxiety disorder, unspecified: Secondary | ICD-10-CM

## 2019-06-24 ENCOUNTER — Encounter: Payer: Self-pay | Admitting: Family Medicine

## 2019-06-24 NOTE — Telephone Encounter (Signed)
Rx last filled : 05/30/19 Last OV: 06/14/19 (virtual) Next OV: not scheduled Med Contract : not on file for this medication

## 2019-07-05 ENCOUNTER — Encounter: Payer: Self-pay | Admitting: Family Medicine

## 2019-07-05 ENCOUNTER — Telehealth: Payer: Self-pay | Admitting: Family Medicine

## 2019-07-05 NOTE — Progress Notes (Signed)
  Chronic Care Management   Outreach Note  07/05/2019 Name: [name] MRN: [id number] DOB: [birth year]  Referred by: Martinique, Betty G, MD Reason for referral : No chief complaint on file.   An unsuccessful telephone outreach was attempted today. The patient was referred to the pharmacist for assistance with care management and care coordination.   Follow Up Plan:   Raynicia Dukes [employer] Scheduler

## 2019-07-07 ENCOUNTER — Encounter: Payer: Medicare HMO | Attending: Physical Medicine & Rehabilitation | Admitting: Registered Nurse

## 2019-07-07 ENCOUNTER — Other Ambulatory Visit: Payer: Self-pay

## 2019-07-07 ENCOUNTER — Encounter: Payer: Self-pay | Admitting: Registered Nurse

## 2019-07-07 VITALS — BP 147/85 | HR 67 | Temp 97.5°F | Ht 67.5 in | Wt 189.5 lb

## 2019-07-07 DIAGNOSIS — Z79899 Other long term (current) drug therapy: Secondary | ICD-10-CM

## 2019-07-07 DIAGNOSIS — G894 Chronic pain syndrome: Secondary | ICD-10-CM

## 2019-07-07 DIAGNOSIS — G8929 Other chronic pain: Secondary | ICD-10-CM

## 2019-07-07 DIAGNOSIS — M25512 Pain in left shoulder: Secondary | ICD-10-CM | POA: Insufficient documentation

## 2019-07-07 DIAGNOSIS — F329 Major depressive disorder, single episode, unspecified: Secondary | ICD-10-CM | POA: Insufficient documentation

## 2019-07-07 DIAGNOSIS — M5030 Other cervical disc degeneration, unspecified cervical region: Secondary | ICD-10-CM | POA: Insufficient documentation

## 2019-07-07 DIAGNOSIS — M545 Low back pain, unspecified: Secondary | ICD-10-CM

## 2019-07-07 DIAGNOSIS — T1490XA Injury, unspecified, initial encounter: Secondary | ICD-10-CM | POA: Diagnosis not present

## 2019-07-07 DIAGNOSIS — M5412 Radiculopathy, cervical region: Secondary | ICD-10-CM | POA: Diagnosis not present

## 2019-07-07 DIAGNOSIS — F4310 Post-traumatic stress disorder, unspecified: Secondary | ICD-10-CM | POA: Insufficient documentation

## 2019-07-07 DIAGNOSIS — E119 Type 2 diabetes mellitus without complications: Secondary | ICD-10-CM | POA: Insufficient documentation

## 2019-07-07 DIAGNOSIS — R102 Pelvic and perineal pain: Secondary | ICD-10-CM | POA: Diagnosis not present

## 2019-07-07 DIAGNOSIS — Z5181 Encounter for therapeutic drug level monitoring: Secondary | ICD-10-CM | POA: Diagnosis not present

## 2019-07-07 DIAGNOSIS — M542 Cervicalgia: Secondary | ICD-10-CM

## 2019-07-07 DIAGNOSIS — E785 Hyperlipidemia, unspecified: Secondary | ICD-10-CM | POA: Insufficient documentation

## 2019-07-07 DIAGNOSIS — I10 Essential (primary) hypertension: Secondary | ICD-10-CM | POA: Diagnosis not present

## 2019-07-07 DIAGNOSIS — G2581 Restless legs syndrome: Secondary | ICD-10-CM | POA: Insufficient documentation

## 2019-07-07 DIAGNOSIS — M7918 Myalgia, other site: Secondary | ICD-10-CM

## 2019-07-07 DIAGNOSIS — M47812 Spondylosis without myelopathy or radiculopathy, cervical region: Secondary | ICD-10-CM | POA: Diagnosis not present

## 2019-07-07 DIAGNOSIS — M79672 Pain in left foot: Secondary | ICD-10-CM | POA: Diagnosis not present

## 2019-07-07 MED ORDER — HYDROCODONE-ACETAMINOPHEN 5-325 MG PO TABS: 1.0000 | ORAL_TABLET | Freq: Four times a day (QID) | ORAL | 0 refills | Status: DC | PRN
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
## 2019-07-08 ENCOUNTER — Telehealth: Payer: Self-pay | Admitting: Family Medicine

## 2019-07-08 DIAGNOSIS — I10 Essential (primary) hypertension: Secondary | ICD-10-CM | POA: Diagnosis not present

## 2019-07-08 DIAGNOSIS — G500 Trigeminal neuralgia: Secondary | ICD-10-CM | POA: Diagnosis not present

## 2019-07-08 DIAGNOSIS — E119 Type 2 diabetes mellitus without complications: Secondary | ICD-10-CM | POA: Diagnosis not present

## 2019-07-08 DIAGNOSIS — R519 Headache, unspecified: Secondary | ICD-10-CM | POA: Diagnosis not present

## 2019-07-08 DIAGNOSIS — Z79899 Other long term (current) drug therapy: Secondary | ICD-10-CM | POA: Diagnosis not present

## 2019-07-08 DIAGNOSIS — R000 Tachycardia, unspecified: Secondary | ICD-10-CM | POA: Diagnosis not present

## 2019-07-08 NOTE — Progress Notes (Signed)
  Chronic Care Management   Note  07/08/2019 Name: [name] MRN: [id number] DOB: October 28, 1950  [name] is a 69 y.o. year old female who is a primary care patient of Martinique, Malka So, MD. I reached out to [employer] by phone today in response to a referral sent by Ms. Charna Busman Beckmann's PCP, Martinique, Betty G, MD.   Ms. Strang was given information about Chronic Care Management services today including:  1. CCM service includes personalized support from designated clinical staff supervised by her physician, including individualized plan of care and coordination with other care providers 2. 24/7 contact phone numbers for assistance for urgent and routine care needs. 3. Service will only be billed when office clinical staff spend 20 minutes or more in a month to coordinate care. 4. Only one practitioner may furnish and bill the service in a calendar month. 5. The patient may stop CCM services at any time (effective at the end of the month) by phone call to the office staff.   Patient agreed to services and verbal consent obtained.   Follow up plan:   Raynicia Dukes [employer] Scheduler

## 2019-07-12 ENCOUNTER — Encounter: Payer: Self-pay | Admitting: Family Medicine

## 2019-07-12 ENCOUNTER — Other Ambulatory Visit: Payer: Self-pay | Admitting: *Deleted

## 2019-07-12 DIAGNOSIS — E876 Hypokalemia: Secondary | ICD-10-CM

## 2019-07-12 MED ORDER — POTASSIUM CHLORIDE ER 10 MEQ PO TBCR: 10.0000 meq | EXTENDED_RELEASE_TABLET | Freq: Every day | ORAL | 0 refills | Status: DC

## 2019-07-13 ENCOUNTER — Other Ambulatory Visit: Payer: Self-pay | Admitting: Family Medicine

## 2019-07-13 DIAGNOSIS — E876 Hypokalemia: Secondary | ICD-10-CM

## 2019-07-15 ENCOUNTER — Other Ambulatory Visit: Payer: Self-pay | Admitting: Family Medicine

## 2019-07-15 DIAGNOSIS — G4700 Insomnia, unspecified: Secondary | ICD-10-CM

## 2019-07-15 MED ORDER — DOXEPIN HCL 10 MG PO CAPS: 10.0000 mg | ORAL_CAPSULE | Freq: Every day | ORAL | 1 refills | Status: DC

## 2019-07-25 ENCOUNTER — Other Ambulatory Visit: Payer: Self-pay

## 2019-07-26 ENCOUNTER — Ambulatory Visit (INDEPENDENT_AMBULATORY_CARE_PROVIDER_SITE_OTHER): Payer: Medicare HMO | Admitting: Family Medicine

## 2019-07-26 ENCOUNTER — Encounter: Payer: Self-pay | Admitting: Family Medicine

## 2019-07-26 VITALS — BP 110/60 | HR 88 | Temp 97.7°F | Resp 12 | Ht 67.5 in | Wt 190.0 lb

## 2019-07-26 DIAGNOSIS — G4700 Insomnia, unspecified: Secondary | ICD-10-CM

## 2019-07-26 DIAGNOSIS — R519 Headache, unspecified: Secondary | ICD-10-CM | POA: Diagnosis not present

## 2019-07-26 DIAGNOSIS — R42 Dizziness and giddiness: Secondary | ICD-10-CM

## 2019-07-26 DIAGNOSIS — J301 Allergic rhinitis due to pollen: Secondary | ICD-10-CM

## 2019-07-26 DIAGNOSIS — G63 Polyneuropathy in diseases classified elsewhere: Secondary | ICD-10-CM

## 2019-07-26 DIAGNOSIS — R002 Palpitations: Secondary | ICD-10-CM | POA: Diagnosis not present

## 2019-07-26 DIAGNOSIS — R110 Nausea: Secondary | ICD-10-CM | POA: Diagnosis not present

## 2019-07-26 DIAGNOSIS — J309 Allergic rhinitis, unspecified: Secondary | ICD-10-CM | POA: Insufficient documentation

## 2019-07-26 MED ORDER — IPRATROPIUM BROMIDE 0.06 % NA SOLN: 2.0000 | Freq: Four times a day (QID) | NASAL | 3 refills | Status: DC

## 2019-07-26 MED ORDER — ONDANSETRON HCL 4 MG/2ML IJ SOLN
4.0000 mg | Freq: Once | INTRAMUSCULAR | Status: AC
  Administered 2019-07-26: 4 mg via INTRAMUSCULAR

## 2019-07-26 MED ORDER — MECLIZINE HCL 25 MG PO TABS: 25.0000 mg | ORAL_TABLET | Freq: Two times a day (BID) | ORAL | 0 refills | Status: AC | PRN

## 2019-07-26 NOTE — Assessment & Plan Note (Addendum)
Symptomatic. Continue Claritin 10 mg daily and saline nasal irrigations. Atrovent intranasal spray may help.

## 2019-07-26 NOTE — Assessment & Plan Note (Signed)
Problem is better controlled. Continue doxepin 10 mg daily. Good sleep hygiene.

## 2019-07-26 NOTE — Progress Notes (Signed)
Chief Complaint  Patient presents with  . Hospitalization Follow-up    hospital follow-up for neuralgia    HPI:  [name] is a 69 y.o. female, who is here today to follow on recent ER visit. She presented to the ER at [facility] on 07/08/19 because left temporal headache.   "Zip" like sensation left temporal,intermittent and lasting 3-5 min each time. According to pt, EKG "was ok." Head CT negative for acute process. Low Mg, 1.6. It was treated in the ER with IV mg, she is not on Mg supplementation.  CBC in normal range. Glucose 150,e GFR 60.  Dx'ed with neuralgia. Discharged on Prednisone and Mobic, She has not had headache since 07/11/19.  -Allergies, having rhinorrhea daily,nasal congestion,and post nasal drainage.  Nasal irrigations and Claritin help some. She has not had fever,chills,sore throat,cough,or wheezing.  Insomnia: She resumed Doxepin 10 mg and taking Melatonin. Sleeping about 7 hours. No side effects reported. Has been anxious,ex-fiance has been dealing with health issues, cancer.  She is also c/o feet numbness,chronic problem. She has followed with neurologist, dx'ed with peripheral neuropathy.  She is on Gabapentin 800 mg tid, which has helped but problem still present. Follows with pain management.  Review of Systems  Constitutional: Positive for fatigue. Negative for activity change and appetite change.  HENT: Negative for mouth sores and nosebleeds.   Eyes: Negative for redness and visual disturbance.  Respiratory: Negative for shortness of breath.   Cardiovascular: Negative for chest pain and leg swelling.  Gastrointestinal: Negative for abdominal pain, nausea and vomiting.       Negative for changes in bowel habits.  Genitourinary: Negative for decreased urine volume and hematuria.  Musculoskeletal: Positive for arthralgias and neck pain. Negative for gait problem.  Skin: Negative for rash and wound.    Allergic/Immunologic: Positive for environmental allergies.  Neurological: Negative for syncope, facial asymmetry and weakness.  Psychiatric/Behavioral: Negative for confusion. The patient is nervous/anxious.   Rest see pertinent positives and negatives per HPI.   Current Outpatient Medications on File Prior to Visit  Medication Sig Dispense Refill  . ACCU-CHEK SOFTCLIX LANCETS lancets Use to test blood sugar 3 times daily. Dx: E11.8 100 each 12  . albuterol (VENTOLIN HFA) 108 (90 Base) MCG/ACT inhaler INHALE 2 PUFFS INTO THE LUNGS EVERY 6 HOURS AS NEEDED FOR WHEEZING OR SHORTNESS OF BREATH 54 g 0  . Alcohol Swabs (B-D SINGLE USE SWABS REGULAR) PADS Use to test blood sugar 3 times daily 100 each 3  . benzonatate (TESSALON PERLES) 100 MG capsule Take 1 capsule (100 mg total) by mouth 3 (three) times daily as needed. 20 capsule 0  . Blood Glucose Calibration (ACCU-CHEK AVIVA) SOLN Use as directed 1 each 2  . Blood Glucose Monitoring Suppl (ACCU-CHEK AVIVA PLUS) w/Device KIT Use to check blood sugar 3 times daily 1 kit 1  . clonazePAM (KLONOPIN) 1 MG tablet TAKE 1 TABLET(1 MG) BY MOUTH TWICE DAILY AS NEEDED FOR ANXIETY. CONTINUE WEANING OFF AS DIRECTED 60 tablet 3  . cyanocobalamin 100 MCG tablet Take 100 mcg by mouth daily.    Marland Kitchen doxepin (SINEQUAN) 10 MG capsule Take 1 capsule (10 mg total) by mouth at bedtime. 30 capsule 1  . Dulaglutide (TRULICITY) 1.5 SJ/6.2EZ SOPN Inject 1.5 mg into the skin once a week. 13 pen 3  . fenofibrate (TRICOR) 145 MG tablet Take 1 tablet (145 mg total) by mouth daily. 30 tablet 3  . gabapentin (NEURONTIN) 400 MG  capsule Take 1 capsule (400 mg total) by mouth 3 (three) times daily. 90 capsule 3  . glucose blood (ACCU-CHEK AVIVA) test strip Use to test blood sugar 3 times daily. Dx: 11.8 100 each 12  . hydrochlorothiazide (HYDRODIURIL) 25 MG tablet Take 1 tablet (25 mg total) by mouth daily. 90 tablet 3  . HYDROcodone-acetaminophen (NORCO/VICODIN) 5-325 MG tablet  Take 1 tablet by mouth every 6 (six) hours as needed for moderate pain. 120 tablet 0  . multivitamin-lutein (OCUVITE-LUTEIN) CAPS capsule Take 1 capsule by mouth daily.    . Olopatadine HCl (PAZEO) 0.7 % SOLN Apply 1 drop to eye daily. 2.5 mL 1  . omeprazole (PRILOSEC) 40 MG capsule Take 1 capsule (40 mg total) by mouth daily. 90 capsule 3  . potassium chloride (KLOR-CON) 10 MEQ tablet Take 1 tablet (10 mEq total) by mouth daily. 90 tablet 0   No current facility-administered medications on file prior to visit.   Past Medical History:  Diagnosis Date  . Anxiety   . Arthritis   . Diabetes mellitus without complication ([place])   . Hyperlipidemia   . Hypertension   . PTSD (post-traumatic stress disorder)    Allergies  Allergen Reactions  . Hydromorphone Nausea And Vomiting and Other (See Comments)    Made the patient feel "spaced out," also Made the patient feel "spaced out," also  . Morphine And Related Itching  . Other Itching  . Penicillins Rash    Has patient had a PCN reaction causing immediate rash, facial/tongue/throat swelling, SOB or lightheadedness with hypotension: Yes Has patient had a PCN reaction causing severe rash involving mucus membranes or skin necrosis: No Has patient had a PCN reaction that required hospitalization: No Has patient had a PCN reaction occurring within the last 10 years: No If all of the above answers are "NO", then may proceed with Cephalosporin use.     Social History   Socioeconomic History  . Marital status: Divorced    Spouse name: Not on file  . Number of children: Not on file  . Years of education: Not on file  . Highest education level: Not on file  Occupational History  . Not on file  Tobacco Use  . Smoking status: Never Smoker  . Smokeless tobacco: Never Used  Substance and Sexual Activity  . Alcohol use: No  . Drug use: No  . Sexual activity: Not on file  Other Topics Concern  . Not on file  Social History Narrative  . Not  on file   Social Determinants of Health   Financial Resource Strain:   . Difficulty of Paying Living Expenses:   Food Insecurity:   . Worried About Charity fundraiser in the Last Year:   . Arboriculturist in the Last Year:   Transportation Needs:   . Film/video editor (Medical):   Marland Kitchen Lack of Transportation (Non-Medical):   Physical Activity:   . Days of Exercise per Week:   . Minutes of Exercise per Session:   Stress:   . Feeling of Stress :   Social Connections:   . Frequency of Communication with Friends and Family:   . Frequency of Social Gatherings with Friends and Family:   . Attends Religious Services:   . Active Member of Clubs or Organizations:   . Attends Archivist Meetings:   Marland Kitchen Marital Status:     Vitals:   07/26/19 1133  BP: 110/60  Pulse: 88  Resp: 12  Temp:  97.7 F (36.5 C)  SpO2: 97%   Body mass index is 29.32 kg/m.  Physical Exam  Nursing note and vitals reviewed. Constitutional: She is oriented to person, place, and time. She appears well-developed. No distress.  HENT:  Head: Normocephalic and atraumatic.  Mouth/Throat: Oropharynx is clear and moist and mucous membranes are normal.  Eyes: Pupils are equal, round, and reactive to light. Conjunctivae are normal.  Cardiovascular: Normal rate and regular rhythm.  No murmur heard. Respiratory: Effort normal and breath sounds normal. No respiratory distress.  GI: Soft. She exhibits no mass. There is no hepatomegaly. There is no abdominal tenderness.  Musculoskeletal:        General: No edema.  Lymphadenopathy:    She has no cervical adenopathy.  Neurological: She is alert and oriented to person, place, and time. She has normal strength. No cranial nerve deficit. Gait normal.  Reflex Scores:      Bicep reflexes are 2+ on the right side and 2+ on the left side.      Patellar reflexes are 2+ on the right side and 2+ on the left side. Skin: Skin is warm. No rash noted. No erythema.   Psychiatric: Her mood appears anxious.  Well groomed, good eye contact.   ASSESSMENT AND PLAN:  Ms. Jeanette was seen today for hospitalization follow-up.  Diagnoses and all orders for this visit:  Orders Placed This Encounter  Procedures  . EKG 12-Lead   Left temporal headache Resolved.  Hypomagnesemia Mild. We will plan on re-checking Mg with next blood work.  Polyneuropathy associated with underlying disease (Standard) No changes in current management. Adequate foot care discussed.   Insomnia Problem is better controlled. Continue doxepin 10 mg daily. Good sleep hygiene.   Allergic rhinitis Symptomatic. Continue Claritin 10 mg daily and saline nasal irrigations. Atrovent intranasal spray may help.    Whyatt Klinger G. Martinique, MD  [facility]. [place] office.   VISIT # 2 07/26/19 at 1:45 Pm Ms [name] came back because started with lightheadedness after eating lunch,vegetables. + Nausea. She described discomfort as a spinning sensation. Sudden onset. Denies headache,visual changes,CP,SOB,palpitation,or diaphoresis. She has had vertigo.  Ago.  BP 180/80. Glucose 156.  She is anxious,unstable gait due to spinning sensation but no focal deficit. Oriented x 3.  -Around 2:40 pm: Still nauseated, agrees with Zofran 4 mg IM x 1.  -3:04 pm Nausea and spinning sensation have improved. Very anxious, crying. "I do not feel well"  C/O "fluttery" sensation in her chest and epigastric discomfort. + Burping. Reports hx of irregular HR. No CP,SOB,or diaphoresis.  BP 150/90 LUE. EKG will be done.  4:03 pm BP 120/75 LUE. EKG SR,LAD, LAFB,voltage criteria for LVH, normal internals,no T wave or ST abnormalities.  No significant changes when compared with EKG done on 03/09/2018.  She is feeling much better,still mildly lightheaded. She is not longer having spinning sensation. No CP,SOB,or diaphoresis. Regular rhythm and lungs clear on auscultation. Negative for  suicidal thoughts.  She does not think she needs to go to the ER nor blood work, for the latter she agrees to wait until next visit. Meclizine recommended for vertigo, some side effects discussed.  She feels like she can drive. She is calling her ex-fiance to drive behind her.  Nausea without vomiting Improved with Zofran 4 mg IM.  -     ondansetron (ZOFRAN) injection 4 mg  Vertigo Provided hx suggest vertigo. Educated about dx. Resolved during observation but still having some lightheadedness when getting up.  Fall precautions discussed. If problem lasts more than 10-14 days we can consider ENT or neuro evaluation.  -     meclizine (ANTIVERT) 25 MG tablet; Take 1 tablet (25 mg total) by mouth 2 (two) times daily as needed for up to 20 days for dizziness. -     ondansetron (ZOFRAN) injection 4 mg  Heart palpitations Resolved. -     EKG 12-Lead  > 40 min face to face OV. > 50% was dedicated to discussion of Dx, prognosis, treatment options, and some side effects of medications.   Montavius Subramaniam Martinique, MD     Vertigo Vertigo is the feeling that you or the things around you are moving when they are not. This feeling can come and go at any time. Vertigo often goes away on its own. This condition can be dangerous if it happens when you are doing activities like driving or working with machines. Your doctor will do tests to find the cause of your vertigo. These tests will also help your doctor decide on the best treatment for you. Follow these instructions at home: Eating and drinking      Drink enough fluid to keep your pee (urine) pale yellow.  Do not drink alcohol. Activity  Return to your normal activities as told by your doctor. Ask your doctor what activities are safe for you.  In the morning, first sit up on the side of the bed. When you feel okay, stand slowly while you hold onto something until you know that your balance is fine.  Move slowly. Avoid sudden body or  head movements or certain positions, as told by your doctor.  Use a cane if you have trouble standing or walking.  Sit down right away if you feel dizzy.  Avoid doing any tasks or activities that can cause danger to you or others if you get dizzy.  Avoid bending down if you feel dizzy. Place items in your home so that they are easy for you to reach without leaning over.  Do not drive or use heavy machinery if you feel dizzy. General instructions  Take over-the-counter and prescription medicines only as told by your doctor.  Keep all follow-up visits as told by your doctor. This is important. Contact a doctor if:  Your medicine does not help your vertigo.  You have a fever.  Your problems get worse or you have new symptoms.  Your family or friends see changes in your behavior.  The feeling of being sick to your stomach gets worse.  Your vomiting gets worse.  You lose feeling (have numbness) in part of your body.  You feel prickling and tingling in a part of your body. Get help right away if:  You have trouble moving or talking.  You are always dizzy.  You pass out (faint).  You get very bad headaches.  You feel weak in your hands, arms, or legs.  You have changes in your hearing.  You have changes in how you see (vision).  You get a stiff neck.  Bright light starts to bother you. Summary  Vertigo is the feeling that you or the things around you are moving when they are not.  Your doctor will do tests to find the cause of your vertigo.  You may be told to avoid some tasks, positions, or movements.  Contact a doctor if your medicine is not helping, or if you have a fever, new symptoms, or a change in behavior.  Get help right away  if you get very bad headaches, or if you have changes in how you speak, hear, or see. This information is not intended to replace advice given to you by your health care provider. Make sure you discuss any questions you have with  your health care provider. Document Revised: 02/15/2018 Document Reviewed: 02/15/2018 Elsevier Patient Education  2020 [employer].

## 2019-07-26 NOTE — Patient Instructions (Addendum)
A few things to remember from today's visit:  Continue Doxepin and Melatonin. We will check magnesium with next blood work. Appt for 2nd week of My/2021.  Please be sure medication list is accurate. If a new problem present, please set up appointment sooner than planned today.     Vertigo Vertigo is the feeling that you or the things around you are moving when they are not. This feeling can come and go at any time. Vertigo often goes away on its own. This condition can be dangerous if it happens when you are doing activities like driving or working with machines. Your doctor will do tests to find the cause of your vertigo. These tests will also help your doctor decide on the best treatment for you. Follow these instructions at home: Eating and drinking      Drink enough fluid to keep your pee (urine) pale yellow.  Do not drink alcohol. Activity  Return to your normal activities as told by your doctor. Ask your doctor what activities are safe for you.  In the morning, first sit up on the side of the bed. When you feel okay, stand slowly while you hold onto something until you know that your balance is fine.  Move slowly. Avoid sudden body or head movements or certain positions, as told by your doctor.  Use a cane if you have trouble standing or walking.  Sit down right away if you feel dizzy.  Avoid doing any tasks or activities that can cause danger to you or others if you get dizzy.  Avoid bending down if you feel dizzy. Place items in your home so that they are easy for you to reach without leaning over.  Do not drive or use heavy machinery if you feel dizzy. General instructions  Take over-the-counter and prescription medicines only as told by your doctor.  Keep all follow-up visits as told by your doctor. This is important. Contact a doctor if:  Your medicine does not help your vertigo.  You have a fever.  Your problems get worse or you have new  symptoms.  Your family or friends see changes in your behavior.  The feeling of being sick to your stomach gets worse.  Your vomiting gets worse.  You lose feeling (have numbness) in part of your body.  You feel prickling and tingling in a part of your body. Get help right away if:  You have trouble moving or talking.  You are always dizzy.  You pass out (faint).  You get very bad headaches.  You feel weak in your hands, arms, or legs.  You have changes in your hearing.  You have changes in how you see (vision).  You get a stiff neck.  Bright light starts to bother you. Summary  Vertigo is the feeling that you or the things around you are moving when they are not.  Your doctor will do tests to find the cause of your vertigo.  You may be told to avoid some tasks, positions, or movements.  Contact a doctor if your medicine is not helping, or if you have a fever, new symptoms, or a change in behavior.  Get help right away if you get very bad headaches, or if you have changes in how you speak, hear, or see. This information is not intended to replace advice given to you by your health care provider. Make sure you discuss any questions you have with your health care provider. Document Revised: 02/15/2018 Document Reviewed:  02/15/2018 Elsevier Patient Education  [place].

## 2019-07-26 NOTE — Assessment & Plan Note (Signed)
No changes in current management. Adequate foot care discussed.

## 2019-07-29 ENCOUNTER — Other Ambulatory Visit: Payer: Self-pay | Admitting: *Deleted

## 2019-07-29 DIAGNOSIS — I10 Essential (primary) hypertension: Secondary | ICD-10-CM

## 2019-07-29 DIAGNOSIS — E1149 Type 2 diabetes mellitus with other diabetic neurological complication: Secondary | ICD-10-CM

## 2019-07-29 NOTE — Telephone Encounter (Signed)
Referral placed as requested.

## 2019-08-01 ENCOUNTER — Other Ambulatory Visit: Payer: Self-pay

## 2019-08-01 ENCOUNTER — Ambulatory Visit: Payer: Medicare HMO

## 2019-08-01 DIAGNOSIS — F32A Depression, unspecified: Secondary | ICD-10-CM

## 2019-08-01 DIAGNOSIS — E1169 Type 2 diabetes mellitus with other specified complication: Secondary | ICD-10-CM

## 2019-08-01 DIAGNOSIS — F329 Major depressive disorder, single episode, unspecified: Secondary | ICD-10-CM

## 2019-08-01 DIAGNOSIS — G4700 Insomnia, unspecified: Secondary | ICD-10-CM

## 2019-08-01 DIAGNOSIS — F4310 Post-traumatic stress disorder, unspecified: Secondary | ICD-10-CM

## 2019-08-01 DIAGNOSIS — F419 Anxiety disorder, unspecified: Secondary | ICD-10-CM

## 2019-08-01 DIAGNOSIS — I700 Atherosclerosis of aorta: Secondary | ICD-10-CM

## 2019-08-01 DIAGNOSIS — K219 Gastro-esophageal reflux disease without esophagitis: Secondary | ICD-10-CM

## 2019-08-01 DIAGNOSIS — J301 Allergic rhinitis due to pollen: Secondary | ICD-10-CM

## 2019-08-01 DIAGNOSIS — E1149 Type 2 diabetes mellitus with other diabetic neurological complication: Secondary | ICD-10-CM

## 2019-08-01 DIAGNOSIS — G63 Polyneuropathy in diseases classified elsewhere: Secondary | ICD-10-CM

## 2019-08-01 DIAGNOSIS — G894 Chronic pain syndrome: Secondary | ICD-10-CM

## 2019-08-01 DIAGNOSIS — G2581 Restless legs syndrome: Secondary | ICD-10-CM

## 2019-08-01 DIAGNOSIS — I10 Essential (primary) hypertension: Secondary | ICD-10-CM

## 2019-08-01 NOTE — Chronic Care Management (AMB) (Signed)
Chronic Care Management Pharmacy  Name: [name]  MRN: [id number] DOB: Jul 23, 1950  Initial Questions: 1. Have you seen any other providers since your last visit? NA 2. Any changes in your medicines or health? No   Chief Complaint/ HPI  [name],  69 y.o. , female presents for their Initial CCM visit with the clinical pharmacist via telephone due to COVID-19 Pandemic.  PCP : Martinique, Betty G, MD  Their chronic conditions include: DM, HTN, atherosclerosis of aorta/ HLD, allergic rhinits, GERD, insomnia, anxiety and depression, polyneuropathy, restless legs syndrome chronic pain syndrome  Office Visits: 07/26/2019- Patient presented for office visit with Dr. Betty Martinique, MD for hospitalization follow-up. She presented to ER due to left temporal headache. Patient reported headache resolved. Plan to recheck Mg at next blood work. Patient came back to due to feeling lightheaded and with nausea after eating lunch. Patient given Zofran 41m IM x1. Patient agreed to wait until next visit. Meclizine was recommended for vertigo.   06/14/2019- Patient presented for virtual visit with Dr. Betty JMartinique MD for conjunctivitis. Patient was recommended topical antihistamine (olopatadine 0.7% solution). Patient to try warm compresses.   Consult Visit: 07/07/2019- Physical Medicine and Rehabilitation- Patient presented for office visit with EDanella Sensing NP for chronic pain and medication refill follow up. Patient to continue gabapentin for chronic cervicalgia, bilateral feet with neuropathic pain, and restless legs syndrome. For left shoulder pain, hydrocodone/ APAP was refilled. Patient to continue Robaxin for myofascial pain. Patient to follow up in 2 months.   Medications: Outpatient Encounter Medications as of 08/01/2019  Medication Sig  . ACCU-CHEK SOFTCLIX LANCETS lancets Use to test blood sugar 3 times daily. Dx: E11.8  . albuterol (VENTOLIN HFA) 108 (90 Base) MCG/ACT inhaler  INHALE 2 PUFFS INTO THE LUNGS EVERY 6 HOURS AS NEEDED FOR WHEEZING OR SHORTNESS OF BREATH  . Alcohol Swabs (B-D SINGLE USE SWABS REGULAR) PADS Use to test blood sugar 3 times daily  . Blood Glucose Calibration (ACCU-CHEK AVIVA) SOLN Use as directed  . Blood Glucose Monitoring Suppl (ACCU-CHEK AVIVA PLUS) w/Device KIT Use to check blood sugar 3 times daily  . clonazePAM (KLONOPIN) 1 MG tablet TAKE 1 TABLET(1 MG) BY MOUTH TWICE DAILY AS NEEDED FOR ANXIETY. CONTINUE WEANING OFF AS DIRECTED  . cyanocobalamin 100 MCG tablet Take 500 mcg by mouth daily.   .Marland Kitchendoxepin (SINEQUAN) 10 MG capsule Take 1 capsule (10 mg total) by mouth at bedtime.  . Dulaglutide (TRULICITY) 1.5 MWF/0.9NASOPN Inject 1.5 mg into the skin once a week.  . fenofibrate (TRICOR) 145 MG tablet Take 1 tablet (145 mg total) by mouth daily.  .Marland Kitchengabapentin (NEURONTIN) 400 MG capsule Take 1 capsule (400 mg total) by mouth 3 (three) times daily.  .Marland Kitchenglucose blood (ACCU-CHEK AVIVA) test strip Use to test blood sugar 3 times daily. Dx: 11.8  . hydrochlorothiazide (HYDRODIURIL) 25 MG tablet Take 1 tablet (25 mg total) by mouth daily.  .Marland KitchenHYDROcodone-acetaminophen (NORCO/VICODIN) 5-325 MG tablet Take 1 tablet by mouth every 6 (six) hours as needed for moderate pain.  .Marland Kitchenipratropium (ATROVENT) 0.06 % nasal spray Place 2 sprays into both nostrils 4 (four) times daily.  . meclizine (ANTIVERT) 25 MG tablet Take 1 tablet (25 mg total) by mouth 2 (two) times daily as needed for up to 20 days for dizziness.  . multivitamin-lutein (OCUVITE-LUTEIN) CAPS capsule Take 1 capsule by mouth daily.  .Marland Kitchenomeprazole (PRILOSEC) 40 MG capsule Take 1 capsule (40 mg total) by mouth daily.  .Marland Kitchen  potassium chloride (KLOR-CON) 10 MEQ tablet Take 1 tablet (10 mEq total) by mouth daily.  . benzonatate (TESSALON PERLES) 100 MG capsule Take 1 capsule (100 mg total) by mouth 3 (three) times daily as needed. (Patient not taking: Reported on 08/01/2019)  . Olopatadine HCl (PAZEO) 0.7 %  SOLN Apply 1 drop to eye daily. (Patient not taking: Reported on 08/01/2019)   No facility-administered encounter medications on file as of 08/01/2019.    Current Diagnosis/Assessment:  Goals Addressed            This Visit's Progress   . Pharmacy Care Plan       CARE PLAN ENTRY  Current Barriers:  . Chronic Disease Management support, education, and care coordination needs related to Hypertension, Hyperlipidemia, Diabetes, Coronary Artery Disease, Gastroesophageal Reflux Disease, and Allergic rhinitis, Insomnia, polyneuropathy/ restless legs syndrome, chronic pain syndrome    Hypertension . Pharmacist Clinical Goal(s): o Over the next 180 days, patient will work with PharmD and providers to maintain BP goal < 13080 . Current regimen:   Hydrochlorothiazide 31m, 1 tablet once daily  . Interventions: o We discussed: DASH diet:  following a diet emphasizing fruits and vegetables and low-fat dairy products along with whole grains, fish, poultry, and nuts. Reducing red meats and sugars.  . Patient self care activities - Over the next 180 days, patient will: o Ensure daily salt intake < 1500 mg/day o Will work on lifestyle modifications (diet and exercise).   Hyperlipidemia . Pharmacist Clinical Goal(s): o Over the next 180 days, patient will work with PharmD and providers to achieve triglyceride: <150 . Current regimen:  o Fenofibrate 1476m 1 tablet once daily  . Interventions: . How to reduce cholesterol through diet/weight management and physical activity.    . We discussed how a diet high in plant sterols (fruits/vegetables/nuts/whole grains/legumes) may reduce your cholesterol.  Encouraged increasing fiber to a daily intake of 10-25g/day  . Patient self care activities - Over the next 180 days, patient will: o Work on improving lifestyle modifications   Diabetes . Pharmacist Clinical Goal(s): o Over the next 180 days, patient will work with PharmD and providers to maintain  A1c goal < 6.9 . Current regimen:  o Trulicity (dulaglutide), inject 1.26m53mnto skin once a week  . Interventions: . Discussed importance of diabetes preventative health exams. . Patient self care activities - Over the next 180 days, patient will: o Schedule eye doctor appointment.   GERD/ acid reflux  . Pharmacist Clinical Goal(s) o Over the next 180 days, patient will work with PharmD and providers to optimize therapy.  . Current regimen:  o Omeprazole 67m30m capsule once daily  . Interventions: o We discussed:  non-pharmacological interventions for acid reflux. Take measures to prevent acid reflux, such as avoiding spicy foods, avoiding caffeine, avoid laying down a few hours after eating, and raising the head of the bed. . Patient self care activities - Over the next 180 days, patient will: o Continue to work on non-pharmacological interventions to improve symptoms.   Insomnia . Pharmacist Clinical Goal(s) o Over the next 30 days, patient will work with PharmD and providers to optimize therapy. . Current regimen:   Doxepin 10mg27mcapsule at bedtime   Melatonin 3mg, 34m take up 10mg. 51mnterventions: o We discussed: non-pharmacological interventions for insomnia. (Avoid napping, limit exposure to technology near bedtime, etc) . Patient self care activities- Over the next 180 days, patient will: o Patient will work on non-pharmacological interventions.  Anxiety/depression . Pharmacist Clinical Goal(s) o Over the next 180 days, patient continue use as needed  . Current regimen:  o Clonazepam 37m, 1 tablet twice daily as needed for anxiety . Patient self care activities o Patient will continue use as directed.   Polyneuropathy/ restless leg syndrome  . Pharmacist Clinical Goal(s) o Over the next 30 days, patient will work with PharmD and providers to optimize therapy. . Current regimen:  o Gabapentin 407m 1 capsule three times daily  . Interventions: o Will look at  insurance formulary coverage and will provide other options to consider  . Patient self care activities o Patient will continue to follow up and discuss other options with Dr. JoMartinique Chronic pain syndrome . Pharmacist Clinical Goal(s) o Over the next 180 days, patient will continue to take as needed for pain  . Current regimen:  o Hydrocodone/ APAP 5/32556m1 tablet every six hours as needed for moderate pain  . Patient self care activities o Patient will continue as directed.   Allergic rhinitis . Pharmacist Clinical Goal(s) o Over the next 180 days, patient will continue current regimen . Current regimen:   Olopatadine (Pazeo) 0.7% solution, 1 drop to eye daily  Systane regular eye drops  Ipratropium (Atrovent) 0.06% nasal spray, 2 sprays into both nostrils four times daily  . Patient self care activities o Patient will continue current regimen.  Medication management . Pharmacist Clinical Goal(s): o Over the next 180 days, patient will work with PharmD and providers to maintain optimal medication adherence . Current [facility]: [facility] . Interventions o Comprehensive medication review performed. o Continue current medication management strategy . Patient self care activities - Over the next 180 days, patient will: o Take medications as prescribed o Report any questions or concerns to PharmD and/or provider(s)  Initial goal documentation       SDOH Interventions     Most Recent Value  SDOH Interventions  SDOH Interventions for the Following Domains  Financial Strain, Transportation  Financial Strain Interventions  Other (Comment) [None needed]  Transportation Interventions  Other (Comment) [None needed]       Diabetes   Recent Relevant Labs: Lab Results  Component Value Date/Time   HGBA1C 6.9 (H) 04/04/2019 11:05 AM   HGBA1C 7.9 (H) 09/22/2018 02:17 PM   MICROALBUR <0.7 04/04/2019 11:01 AM   MICROALBUR <0.7 05/18/2017 02:31 PM    Checking BG: Daily  Recent  FBG Readings:133 mg/dl  Patient has failed these meds in past: metformin (cramps), Farxiga   Patient is currently controlled on the following medications:   Trulicity (dulaglutide), inject 1.5mg38mto skin once a week   Last diabetic Eye exam: No results found for: HMDIABEYEEXA  - patient reported she will be calling to make an eye doc appt  Within the next week.    Last diabetic Foot exam: No results found for: HMDIABFOOTEX  - patient reported Dr. JordMartiniquepected feet at last visit. Patient has reports having neuropathy and conducts daily feet inspections.  Plan Continue current medications    Hypertension  BP today is:  <130/80  Office blood pressures are  BP Readings from Last 3 Encounters:  07/26/19 110/60  07/07/19 (!) 147/85  05/12/19 (!) 153/75   Patient has failed these meds in the past: none   Patient checks BP at home does not check at home; does not have BP machine   Patient is controlled on:   Hydrochlorothiazide 25mg29mtablet once daily   We discussed diet and  exercise extensively  . DASH diet:  following a diet emphasizing fruits and vegetables and low-fat dairy products along with whole grains, fish, poultry, and nuts. Reducing red meats and sugars.  . Patient reported she can improve diet.   . Exercising    . Patient reported currently not doing anything. Plans to go out on walks.   Plan Continue current medications.    Hyperlipidemia/ atherosclerosis of aorta    Lipid Panel     Component Value Date/Time   CHOL 233 (H) 04/04/2019 1105   CHOL 183 12/16/2016 1517   TRIG (H) 04/04/2019 1105    464.0 Triglyceride is over 400; calculations on Lipids are invalid.   HDL 37.20 (L) 04/04/2019 1105   HDL 53 12/16/2016 1517   CHOLHDL 6 04/04/2019 1105   LDLCALC 84 12/16/2016 1517   LDLDIRECT 94.0 04/04/2019 1105   LABVLDL 46 (H) 12/16/2016 1517    The 10-year ASCVD risk score Mikey Bussing DC Jr., et al., 2013) is: 15.5%   Values used to calculate the  score:     Age: 5 years     Sex: Female     Is Non-Hispanic African American: No     Diabetic: Yes     Tobacco smoker: No     Systolic Blood Pressure: 962 mmHg     Is BP treated: Yes     HDL Cholesterol: 37.2 mg/dL     Total Cholesterol: 233 mg/dL   Patient has failed these meds in past: rosuvastatin (myalgia); patient reports she does not do well on statins.   Patient is currently uncontrolled on the following medications:   Fenofibrate 171m, 1 tablet once daily   Plan Continue current medications (fenofibrate is a new start since current lipid panel).   GERD/ acid reflux   Patient reports having symptoms at times if she eats her "trigger foods". Overall, she reports well controlled.   Patient has failed these meds in past: Protonix (due to insurance formulary)  Patient is currently controlled on the following medications:   Omeprazole 484m 1 capsule once daily   We discussed:  non-pharmacological interventions for acid reflux. Take measures to prevent acid reflux, such as avoiding spicy foods, avoiding caffeine, avoid laying down a few hours after eating, and raising the head of the bed.  Plan Continue current medications  Insomnia  Patient reports still having difficulty falling asleep.  She states she watches movies at night due to having PTSD and not wanting to lay in bed wide awake  Patient has failed these meds in past: Ambien, trazodone  Patient is currently uncontrolled on the following medications:  Doxepin 1066m1 capsule at bedtime   Melatonin 3mg6may take up to 10mg48mWe discussed:  non-pharmacological interventions for insomnia. (Avoid napping, limit exposure to technology near bedtime, etc)  Plan Will look at other options for insomnia based on insurance formulary.   Continue current medications  Anxiety/ depression  Patient is currently controlled on the following medications:   Clonazepam 1mg, 45mablet twice daily as needed for anxiety  We  discussed:  caution concomitant use of hydrocodone and clonazepam-- patient reported not taking both at same time   Plan Continue current medications  Polyneuropathy/ restless leg syndrome   Patient states this is not well controlled. She states she gets restless legs at night.  Patient is currently uncontrolled on the following medications:   Gabapentin 400mg, 68mpsule three times daily    Plan Will look at other options based  on formulary coverage.   Continue current medications  Chronic pain   Patient is currently controlled on the following medications:  Hydrocodone/ APAP 5/373m, 1 tablet every six hours as needed for moderate pain   Plan Continue current medications.  Allergic rhinitis   Patient is currently controlled on the following medications:   Olopatadine (Pazeo) 0.7% solution, 1 drop to eye daily  Systane regular eye drops  Ipratropium (Atrovent) 0.06% nasal spray, 2 sprays into both nostrils four times daily   Plan Continue current medications  OTC/ supplements/ MISC  Patient is currently on the following medications:   Albuterol HFA inhaler, inhale 2 puffs every six hours as needed for wheezing or shortness of breath   Patient reported use when she had PNA   Benzonatate 1042m 1 capsule three times daily as needed   Cyanocobalamin 10043m 1 tablet once daily  Meclizine 92m40m tablet twice daily as needed for up to 20 days for dizziness  Ocuvite/ Lutein, 1 capsule once daily   Medication Management  [facility]  Adherence: no gaps in refill history  Follow up Follow up visit with PharmD in 6 months. Will conduct general telephone calls for periodic check-ins before next visit.   Will look at insurance formulary to provide other options for  - insomnia - neuropathy  Patient to discuss the following with Dr. JordMartiniquenext follow - Behavior referral (patient interested in a support group for parents with children who have  died.    AnneAnson CroftsarmD Clinical Pharmacist [phone]

## 2019-08-05 DIAGNOSIS — F4310 Post-traumatic stress disorder, unspecified: Secondary | ICD-10-CM | POA: Insufficient documentation

## 2019-08-05 NOTE — Patient Instructions (Addendum)
Visit Information  Goals Addressed            This Visit's Progress   . Pharmacy Care Plan       CARE PLAN ENTRY  Current Barriers:  . Chronic Disease Management support, education, and care coordination needs related to Hypertension, Hyperlipidemia, Diabetes, Coronary Artery Disease, Gastroesophageal Reflux Disease, and Allergic rhinitis, Insomnia, polyneuropathy/ restless legs syndrome, chronic pain syndrome    Hypertension . Pharmacist Clinical Goal(s): o Over the next 180 days, patient will work with PharmD and providers to maintain BP goal < 13080 . Current regimen:   Hydrochlorothiazide 25mg , 1 tablet once daily  . Interventions: o We discussed: DASH diet:  following a diet emphasizing fruits and vegetables and low-fat dairy products along with whole grains, fish, poultry, and nuts. Reducing red meats and sugars.  . Patient self care activities - Over the next 180 days, patient will: o Ensure daily salt intake < 1500 mg/day o Will work on lifestyle modifications (diet and exercise).   Hyperlipidemia . Pharmacist Clinical Goal(s): o Over the next 180 days, patient will work with PharmD and providers to achieve triglyceride: <150 . Current regimen:  o Fenofibrate 145mg , 1 tablet once daily  . Interventions: . How to reduce cholesterol through diet/weight management and physical activity.    . We discussed how a diet high in plant sterols (fruits/vegetables/nuts/whole grains/legumes) may reduce your cholesterol.  Encouraged increasing fiber to a daily intake of 10-25g/day  . Patient self care activities - Over the next 180 days, patient will: o Work on improving lifestyle modifications   Diabetes . Pharmacist Clinical Goal(s): o Over the next 180 days, patient will work with PharmD and providers to maintain A1c goal < 6.9 . Current regimen:  o Trulicity (dulaglutide), inject 1.5mg  into skin once a week  . Interventions: . Discussed importance of diabetes preventative  health exams. . Patient self care activities - Over the next 180 days, patient will: o Schedule eye doctor appointment.   GERD/ acid reflux  . Pharmacist Clinical Goal(s) o Over the next 180 days, patient will work with PharmD and providers to optimize therapy.  . Current regimen:  o Omeprazole 40mg , 1 capsule once daily  . Interventions: o We discussed:  non-pharmacological interventions for acid reflux. Take measures to prevent acid reflux, such as avoiding spicy foods, avoiding caffeine, avoid laying down a few hours after eating, and raising the head of the bed. . Patient self care activities - Over the next 180 days, patient will: o Continue to work on non-pharmacological interventions to improve symptoms.   Insomnia . Pharmacist Clinical Goal(s) o Over the next 30 days, patient will work with PharmD and providers to optimize therapy. . Current regimen:   Doxepin 10mg , 1 capsule at bedtime   Melatonin 3mg , may take up 10mg .  . Interventions: o We discussed: non-pharmacological interventions for insomnia. (Avoid napping, limit exposure to technology near bedtime, etc) . Patient self care activities- Over the next 180 days, patient will: o Patient will work on non-pharmacological interventions.   Anxiety/depression . Pharmacist Clinical Goal(s) o Over the next 180 days, patient continue use as needed  . Current regimen:  o Clonazepam 1mg , 1 tablet twice daily as needed for anxiety . Patient self care activities o Patient will continue use as directed.   Polyneuropathy/ restless leg syndrome  . Pharmacist Clinical Goal(s) o Over the next 30 days, patient will work with PharmD and providers to optimize therapy. . Current regimen:  o Gabapentin  400mg , 1 capsule three times daily  . Interventions: o Will look at insurance formulary coverage and will provide other options to consider  . Patient self care activities o Patient will continue to follow up and discuss other  options with Dr. Martinique.  Chronic pain syndrome . Pharmacist Clinical Goal(s) o Over the next 180 days, patient will continue to take as needed for pain  . Current regimen:  o Hydrocodone/ APAP 5/325mg , 1 tablet every six hours as needed for moderate pain  . Patient self care activities o Patient will continue as directed.   Allergic rhinitis . Pharmacist Clinical Goal(s) o Over the next 180 days, patient will continue current regimen . Current regimen:   Olopatadine (Pazeo) 0.7% solution, 1 drop to eye daily  Systane regular eye drops  Ipratropium (Atrovent) 0.06% nasal spray, 2 sprays into both nostrils four times daily  . Patient self care activities o Patient will continue current regimen.  Medication management . Pharmacist Clinical Goal(s): o Over the next 180 days, patient will work with PharmD and providers to maintain optimal medication adherence . Current pharmacy: [facility] . Interventions o Comprehensive medication review performed. o Continue current medication management strategy . Patient self care activities - Over the next 180 days, patient will: o Take medications as prescribed o Report any questions or concerns to PharmD and/or provider(s)  Initial goal documentation        Ms. Gherardi was given information about Chronic Care Management services today including:  1. CCM service includes personalized support from designated clinical staff supervised by her physician, including individualized plan of care and coordination with other care providers 2. 24/7 contact phone numbers for assistance for urgent and routine care needs. 3. Standard insurance, coinsurance, copays and deductibles apply for chronic care management only during months in which we provide at least 20 minutes of these services. Most insurances cover these services at 100%, however patients may be responsible for any copay, coinsurance and/or deductible if applicable. This service may help you  avoid the need for more expensive face-to-face services. 4. Only one practitioner may furnish and bill the service in a calendar month. 5. The patient may stop CCM services at any time (effective at the end of the month) by phone call to the office staff.  Patient agreed to services and verbal consent obtained.   The patient verbalized understanding of instructions provided today and agreed to receive a mailed copy of patient instruction and/or educational materials. Telephone follow up appointment with pharmacy team member scheduled for: 01/31/2020  Anson Crofts, PharmD Clinical Pharmacist [facility] [place] [phone]      Insomnia Insomnia is a sleep disorder that makes it difficult to fall asleep or stay asleep. Insomnia can cause fatigue, low energy, difficulty concentrating, mood swings, and poor performance at work or school. There are three different ways to classify insomnia:  Difficulty falling asleep.  Difficulty staying asleep.  Waking up too early in the morning. Any type of insomnia can be long-term (chronic) or short-term (acute). Both are common. Short-term insomnia usually lasts for three months or less. Chronic insomnia occurs at least three times a week for longer than three months. What are the causes? Insomnia may be caused by another condition, situation, or substance, such as:  Anxiety.  Certain medicines.  Gastroesophageal reflux disease (GERD) or other gastrointestinal conditions.  Asthma or other breathing conditions.  Restless legs syndrome, sleep apnea, or other sleep disorders.  Chronic pain.  Menopause.  Stroke.  Abuse of alcohol, tobacco, or illegal drugs.  Mental health conditions, such as depression.  Caffeine.  Neurological disorders, such as Alzheimer's disease.  An overactive thyroid (hyperthyroidism). Sometimes, the cause of insomnia may not be known. What increases the risk? Risk factors for  insomnia include:  Gender. Women are affected more often than men.  Age. Insomnia is more common as you get older.  Stress.  Lack of exercise.  Irregular work schedule or working night shifts.  Traveling between different time zones.  Certain medical and mental health conditions. What are the signs or symptoms? If you have insomnia, the main symptom is having trouble falling asleep or having trouble staying asleep. This may lead to other symptoms, such as:  Feeling fatigued or having low energy.  Feeling nervous about going to sleep.  Not feeling rested in the morning.  Having trouble concentrating.  Feeling irritable, anxious, or depressed. How is this diagnosed? This condition may be diagnosed based on:  Your symptoms and medical history. Your health care provider may ask about: ? Your sleep habits. ? Any medical conditions you have. ? Your mental health.  A physical exam. How is this treated? Treatment for insomnia depends on the cause. Treatment may focus on treating an underlying condition that is causing insomnia. Treatment may also include:  Medicines to help you sleep.  Counseling or therapy.  Lifestyle adjustments to help you sleep better. Follow these instructions at home: Eating and drinking   Limit or avoid alcohol, caffeinated beverages, and cigarettes, especially close to bedtime. These can disrupt your sleep.  Do not eat a large meal or eat spicy foods right before bedtime. This can lead to digestive discomfort that can make it hard for you to sleep. Sleep habits   Keep a sleep diary to help you and your health care provider figure out what could be causing your insomnia. Write down: ? When you sleep. ? When you wake up during the night. ? How well you sleep. ? How rested you feel the next day. ? Any side effects of medicines you are taking. ? What you eat and drink.  Make your bedroom a dark, comfortable place where it is easy to fall  asleep. ? Put up shades or blackout curtains to block light from outside. ? Use a white noise machine to block noise. ? Keep the temperature cool.  Limit screen use before bedtime. This includes: ? Watching TV. ? Using your smartphone, tablet, or computer.  Stick to a routine that includes going to bed and waking up at the same times every day and night. This can help you fall asleep faster. Consider making a quiet activity, such as reading, part of your nighttime routine.  Try to avoid taking naps during the day so that you sleep better at night.  Get out of bed if you are still awake after 15 minutes of trying to sleep. Keep the lights down, but try reading or doing a quiet activity. When you feel sleepy, go back to bed. General instructions  Take over-the-counter and prescription medicines only as told by your health care provider.  Exercise regularly, as told by your health care provider. Avoid exercise starting several hours before bedtime.  Use relaxation techniques to manage stress. Ask your health care provider to suggest some techniques that may work well for you. These may include: ? Breathing exercises. ? Routines to release muscle tension. ? Visualizing peaceful scenes.  Make sure that you drive carefully. Avoid driving  if you feel very sleepy.  Keep all follow-up visits as told by your health care provider. This is important. Contact a health care provider if:  You are tired throughout the day.  You have trouble in your daily routine due to sleepiness.  You continue to have sleep problems, or your sleep problems get worse. Get help right away if:  You have serious thoughts about hurting yourself or someone else. If you ever feel like you may hurt yourself or others, or have thoughts about taking your own life, get help right away. You can go to your nearest emergency department or call:  Your local emergency services (911 in the [place]  A suicide crisis  helpline, such as the [place] at 323-530-2981. This is open 24 hours a day. Summary  Insomnia is a sleep disorder that makes it difficult to fall asleep or stay asleep.  Insomnia can be long-term (chronic) or short-term (acute).  Treatment for insomnia depends on the cause. Treatment may focus on treating an underlying condition that is causing insomnia.  Keep a sleep diary to help you and your health care provider figure out what could be causing your insomnia. This information is not intended to replace advice given to you by your health care provider. Make sure you discuss any questions you have with your health care provider. Document Revised: 03/06/2017 Document Reviewed: 01/01/2017 Elsevier Patient Education  2020 [employer].

## 2019-08-09 ENCOUNTER — Other Ambulatory Visit: Payer: Self-pay | Admitting: Family Medicine

## 2019-08-09 DIAGNOSIS — L570 Actinic keratosis: Secondary | ICD-10-CM | POA: Diagnosis not present

## 2019-08-09 DIAGNOSIS — F419 Anxiety disorder, unspecified: Secondary | ICD-10-CM

## 2019-08-09 DIAGNOSIS — F32A Depression, unspecified: Secondary | ICD-10-CM

## 2019-08-10 ENCOUNTER — Telehealth: Payer: Self-pay

## 2019-08-10 NOTE — Telephone Encounter (Signed)
Followed up with patient on other options for insomnia that are covered by insurance. Patient stated she tried several medications in the past with no success. Currently on doxepin and melatonin, but states she has difficulty falling asleep.   Researched patient's insurance formulary.  Belsomra is covered by [employer] It is a tier 3, with copay of $45/ month supply.   Patient to discuss further with Dr. Martinique at follow-up visit on 08/24/2019.   Patient stated she was not wanting other options for gabapentin (differed from last CCM visit, where she requested other options covered by insurance).   Plan CCM follow up visit already scheduled for 01/31/2020.  Anson Crofts, PharmD Clinical Pharmacist [facility] [place] [phone]

## 2019-08-10 NOTE — Telephone Encounter (Signed)
So Clonazepam Rx's need to be cancelled at her former pharmacy. Last Rx sent 06/24/19 with 3 refills; to be filled on 3/22, 4/22 (was filled 2 days earlier),5/22,and 09/27/19. Her next refill is due on 08/27/19. I am sending Rx to her new pharmacy. Thanks, BJ

## 2019-08-18 ENCOUNTER — Encounter: Payer: Self-pay | Admitting: Family Medicine

## 2019-08-19 NOTE — Telephone Encounter (Signed)
Please advise 

## 2019-08-24 ENCOUNTER — Other Ambulatory Visit: Payer: Self-pay

## 2019-08-24 ENCOUNTER — Encounter: Payer: Self-pay | Admitting: Family Medicine

## 2019-08-24 ENCOUNTER — Ambulatory Visit: Payer: Medicare HMO | Admitting: Family Medicine

## 2019-08-24 ENCOUNTER — Ambulatory Visit (INDEPENDENT_AMBULATORY_CARE_PROVIDER_SITE_OTHER): Payer: Medicare HMO | Admitting: Family Medicine

## 2019-08-24 VITALS — BP 120/80 | HR 60 | Temp 97.1°F | Resp 16 | Ht 67.5 in | Wt 194.0 lb

## 2019-08-24 DIAGNOSIS — F329 Major depressive disorder, single episode, unspecified: Secondary | ICD-10-CM | POA: Diagnosis not present

## 2019-08-24 DIAGNOSIS — G4700 Insomnia, unspecified: Secondary | ICD-10-CM | POA: Diagnosis not present

## 2019-08-24 DIAGNOSIS — F32A Depression, unspecified: Secondary | ICD-10-CM

## 2019-08-24 DIAGNOSIS — E1149 Type 2 diabetes mellitus with other diabetic neurological complication: Secondary | ICD-10-CM

## 2019-08-24 DIAGNOSIS — I10 Essential (primary) hypertension: Secondary | ICD-10-CM | POA: Diagnosis not present

## 2019-08-24 DIAGNOSIS — F419 Anxiety disorder, unspecified: Secondary | ICD-10-CM

## 2019-08-24 DIAGNOSIS — I700 Atherosclerosis of aorta: Secondary | ICD-10-CM

## 2019-08-24 LAB — BASIC METABOLIC PANEL
BUN: 15 mg/dL (ref 6–23)
CO2: 31 mEq/L (ref 19–32)
Calcium: 9.5 mg/dL (ref 8.4–10.5)
Chloride: 104 mEq/L (ref 96–112)
Creatinine, Ser: 1.01 mg/dL (ref 0.40–1.20)
GFR: 54.57 mL/min — ABNORMAL LOW (ref >60.00)
Glucose, Bld: 148 mg/dL — ABNORMAL HIGH (ref 70–99)
Potassium: 4.4 mEq/L (ref 3.5–5.1)
Sodium: 143 mEq/L (ref 135–145)

## 2019-08-24 LAB — MAGNESIUM: Magnesium: 1.5 mg/dL (ref 1.5–2.5)

## 2019-08-24 LAB — HEMOGLOBIN A1C: Hgb A1c MFr Bld: 7.1 % — ABNORMAL HIGH (ref 4.6–6.5)

## 2019-08-24 MED ORDER — QUETIAPINE FUMARATE 100 MG PO TABS: 100.0000 mg | ORAL_TABLET | Freq: Every day | ORAL | 2 refills | Status: DC

## 2019-08-24 NOTE — Assessment & Plan Note (Signed)
Problem is not well controlled. Seroquel 100 mg , starting 1/2 first and increased to 100 mg if well tolerated.

## 2019-08-24 NOTE — Assessment & Plan Note (Signed)
HgA1C has been at goal. No changes in current management. Regular exercise and healthy diet with avoidance of added sugar food intake is an important part of treatment and recommended. Annual eye exam, periodic dental and foot care recommended. F/U in 5-6 months

## 2019-08-24 NOTE — Progress Notes (Addendum)
HPI: [name] is a 69 y.o. female, who is here today for chronic disease management.  She was last seen on 07/26/19, when she had an episode of dizziness here in the office, suggestive of vertigo. Meclizine was recommended, she is reporting great improvement. Still having occasional spinning sensation that lasts a few seconds and mild.  Today she is c/o anxiety getting worse and not able to sleep. Doxepin 10 mg is not helping. Difficulty falling asleep, finally she falls asleep around 2-3 am. She starts getting anxious earlier during the day just thinking about not been able to sleep another night.  She was able to sleep through the nigh when she spent the night at the [place], spending time with her son and family.  Problems have been exacerbated by her ex fiance health problems, started strong chemo treatment and she is trying to take care of him.  She is taking Clonazepam bid, this medication does not help with sleep.  She took Cymbalta before,did not think it was helping.  Depression screen [facility] 2/9 08/24/2019 04/07/2019 09/10/2018 07/13/2018 03/10/2018  Decreased Interest 2 0 1 1 1   Down, Depressed, Hopeless 2 0 1 1 1   PHQ - 2 Score 4 0 2 2 2   Altered sleeping 3 1 - - -  Tired, decreased energy 2 1 - - -  Change in appetite 2 1 - - -  Feeling bad or failure about yourself  2 0 - - -  Trouble concentrating 2 0 - - -  Moving slowly or fidgety/restless 2 0 - - -  Suicidal thoughts 0 0 - - -  PHQ-9 Score 17 3 - - -  Difficult doing work/chores Very difficult Somewhat difficult - - -  Some recent data might be hidden   GAD 7 : Generalized Anxiety Score 08/24/2019  Nervous, Anxious, on Edge 3  Control/stop worrying 3  Worry too much - different things 3  Trouble relaxing 3  Restless 2  Easily annoyed or irritable 2  Afraid - awful might happen 2  Total GAD 7 Score 18  Anxiety Difficulty Very difficult   DM II: Negative for polydipsia,polyuria, or  polyphagia. She is on Trulicity 1.5 mg weekly. Tolerating medication well.  Foot numbness, peripheral neuropathy. Gabapentin helping with this and RLS. Stable.  04/04/19 HgA1C was 6.9.  HTN: Negative for severe/frequent headache, visual changes, chest pain, dyspnea, palpitation, claudication, focal weakness, or edema. She is on HCTZ 25 mg daily. HypoK+, she is on KLOR 10 meq daily.  HypoMg, she is not on Mg supplementation.  She would like to have Mg check.  Component     Latest Ref Rng & Units 04/04/2019  Sodium     135 - 145 mEq/L 139  Potassium     3.5 - 5.1 mEq/L 3.8  Chloride     96 - 112 mEq/L 103  CO2     19 - 32 mEq/L 30  Glucose     70 - 99 mg/dL 144 (H)  BUN     6 - 23 mg/dL 11  Creatinine     0.40 - 1.20 mg/dL 0.82  Calcium     8.4 - 10.5 mg/dL 9.0  GFR     >60.00 mL/min 69.48   Review of Systems  Constitutional: Positive for fatigue. Negative for activity change, appetite change and fever.  HENT: Negative for mouth sores, nosebleeds and sore throat.   Respiratory: Negative for cough and wheezing.  Gastrointestinal: Negative for abdominal pain, nausea and vomiting.       Negative for changes in bowel habits.  Genitourinary: Negative for decreased urine volume, dysuria and hematuria.  Musculoskeletal: Positive for arthralgias. Negative for gait problem.  Skin: Negative for rash and wound.  Allergic/Immunologic: Positive for environmental allergies.  Neurological: Negative for syncope, facial asymmetry and weakness.  Psychiatric/Behavioral: Positive for sleep disturbance. Negative for confusion. The patient is nervous/anxious.   Rest of ROS, see pertinent positives sand negatives in HPI  Current Outpatient Medications on File Prior to Visit  Medication Sig Dispense Refill  . ACCU-CHEK SOFTCLIX LANCETS lancets Use to test blood sugar 3 times daily. Dx: E11.8 100 each 12  . albuterol (VENTOLIN HFA) 108 (90 Base) MCG/ACT inhaler INHALE 2 PUFFS INTO THE  LUNGS EVERY 6 HOURS AS NEEDED FOR WHEEZING OR SHORTNESS OF BREATH 54 g 0  . Alcohol Swabs (B-D SINGLE USE SWABS REGULAR) PADS Use to test blood sugar 3 times daily 100 each 3  . benzonatate (TESSALON PERLES) 100 MG capsule Take 1 capsule (100 mg total) by mouth 3 (three) times daily as needed. 20 capsule 0  . Blood Glucose Calibration (ACCU-CHEK AVIVA) SOLN Use as directed 1 each 2  . Blood Glucose Monitoring Suppl (ACCU-CHEK AVIVA PLUS) w/Device KIT Use to check blood sugar 3 times daily 1 kit 1  . clonazePAM (KLONOPIN) 1 MG tablet TAKE 1 TABLET BY MOUTH TWICE A DAY AS NEEDED FOR ANXIETY CONTINUE WEANING OFF AS DIRECTED 60 tablet 3  . cyanocobalamin 100 MCG tablet Take 500 mcg by mouth daily.     . Dulaglutide (TRULICITY) 1.5 FI/4.3PI SOPN Inject 1.5 mg into the skin once a week. 13 pen 3  . fenofibrate (TRICOR) 145 MG tablet Take 1 tablet (145 mg total) by mouth daily. 30 tablet 3  . gabapentin (NEURONTIN) 400 MG capsule Take 1 capsule (400 mg total) by mouth 3 (three) times daily. 90 capsule 3  . glucose blood (ACCU-CHEK AVIVA) test strip Use to test blood sugar 3 times daily. Dx: 11.8 100 each 12  . hydrochlorothiazide (HYDRODIURIL) 25 MG tablet Take 1 tablet (25 mg total) by mouth daily. 90 tablet 3  . HYDROcodone-acetaminophen (NORCO/VICODIN) 5-325 MG tablet Take 1 tablet by mouth every 6 (six) hours as needed for moderate pain. 120 tablet 0  . ipratropium (ATROVENT) 0.06 % nasal spray Place 2 sprays into both nostrils 4 (four) times daily. 15 mL 3  . meloxicam (MOBIC) 7.5 MG tablet     . multivitamin-lutein (OCUVITE-LUTEIN) CAPS capsule Take 1 capsule by mouth daily.    . Olopatadine HCl (PAZEO) 0.7 % SOLN Apply 1 drop to eye daily. 2.5 mL 1  . omeprazole (PRILOSEC) 40 MG capsule Take 1 capsule (40 mg total) by mouth daily. 90 capsule 3  . potassium chloride (KLOR-CON) 10 MEQ tablet Take 1 tablet (10 mEq total) by mouth daily. 90 tablet 0   No current facility-administered medications on  file prior to visit.   Past Medical History:  Diagnosis Date  . Anxiety   . Arthritis   . Diabetes mellitus without complication ([place])   . Hyperlipidemia   . Hypertension   . PTSD (post-traumatic stress disorder)    Allergies  Allergen Reactions  . Hydromorphone Nausea And Vomiting and Other (See Comments)    Made the patient feel "spaced out," also Made the patient feel "spaced out," also  . Morphine And Related Itching  . Other Itching  . Penicillins Rash  Has patient had a PCN reaction causing immediate rash, facial/tongue/throat swelling, SOB or lightheadedness with hypotension: Yes Has patient had a PCN reaction causing severe rash involving mucus membranes or skin necrosis: No Has patient had a PCN reaction that required hospitalization: No Has patient had a PCN reaction occurring within the last 10 years: No If all of the above answers are "NO", then may proceed with Cephalosporin use.     Social History   Socioeconomic History  . Marital status: Divorced    Spouse name: Not on file  . Number of children: Not on file  . Years of education: Not on file  . Highest education level: Not on file  Occupational History  . Not on file  Tobacco Use  . Smoking status: Never Smoker  . Smokeless tobacco: Never Used  Substance and Sexual Activity  . Alcohol use: No  . Drug use: No  . Sexual activity: Not on file  Other Topics Concern  . Not on file  Social History Narrative  . Not on file   Social Determinants of Health   Financial Resource Strain: Low Risk   . Difficulty of Paying Living Expenses: Not hard at all  Food Insecurity:   . Worried About Charity fundraiser in the Last Year:   . Arboriculturist in the Last Year:   Transportation Needs: No Transportation Needs  . Lack of Transportation (Medical): No  . Lack of Transportation (Non-Medical): No  Physical Activity:   . Days of Exercise per Week:   . Minutes of Exercise per Session:   Stress:   .  Feeling of Stress :   Social Connections:   . Frequency of Communication with Friends and Family:   . Frequency of Social Gatherings with Friends and Family:   . Attends Religious Services:   . Active Member of Clubs or Organizations:   . Attends Archivist Meetings:   Marland Kitchen Marital Status:     Vitals:   08/24/19 0925  BP: 120/80  Pulse: 60  Resp: 16  Temp: (!) 97.1 F (36.2 C)  SpO2: 97%   Body mass index is 29.94 kg/m.  Physical Exam  Nursing note and vitals reviewed. Constitutional: She is oriented to person, place, and time. She appears well-developed. No distress.  HENT:  Head: Normocephalic and atraumatic.  Mouth/Throat: Oropharynx is clear and moist and mucous membranes are normal.  Eyes: Pupils are equal, round, and reactive to light. Conjunctivae are normal.  Cardiovascular: Normal rate and regular rhythm.  Occasional extrasystoles (x 2) are present.  No murmur heard. Pulses:      Dorsalis pedis pulses are 2+ on the right side and 2+ on the left side.  Respiratory: Effort normal and breath sounds normal. No respiratory distress.  GI: Soft. She exhibits no mass. There is no hepatomegaly. There is no abdominal tenderness.  Musculoskeletal:        General: No edema.  Lymphadenopathy:    She has no cervical adenopathy.  Neurological: She is alert and oriented to person, place, and time. She has normal strength. No cranial nerve deficit. Gait normal.  Skin: Skin is warm. No rash noted. No erythema.  Psychiatric: Her mood appears anxious. Her affect is labile. She exhibits a depressed mood.  Well groomed, good eye contact.   ASSESSMENT AND PLAN:  [name] was seen today for chronic disease management.  Orders Placed This Encounter  Procedures  . Basic metabolic panel  . Magnesium  .  Hemoglobin A1c  . Fructosamine   Lab Results  Component Value Date   CREATININE 1.01 08/24/2019   BUN 15 08/24/2019   NA 143 08/24/2019   K 4.4 08/24/2019    CL 104 08/24/2019   CO2 31 08/24/2019   Lab Results  Component Value Date   HGBA1C 7.1 (H) 08/24/2019    Type 2 diabetes mellitus with neurological complications ([place]) PVX4I has been at goal. No changes in current management. Regular exercise and healthy diet with avoidance of added sugar food intake is an important part of treatment and recommended. Annual eye exam, periodic dental and foot care recommended. F/U in 5-6 months   Insomnia Problem is not well controlled. Seroquel 100 mg , starting 1/2 first and increased to 100 mg if well tolerated.  Essential hypertension BP adequately controlled. No changes in current management. Continue low salt diet.  Atherosclerosis of aorta ([place]) Incidental finding reported on imaging,11/2016. Statin caused LE achy pain.  Anxiety and depression Problem is not well controlled. Recommend CBT. Going back to activities she enjoyed before,like painting. Seroquel 100 mg added today.  Hypomagnesemia Further recommendations according to lab result.  40 min face to face OV. > 50% was dedicated to discussion of Dx, prognosis, treatment options, and some side effects of medications.She is very anxious, strongly recommend CBT. She loves art, I think painting classes will help greatly.   Return in about 4 months (around 12/25/2019).   Tavarius Grewe G. Martinique, MD  [facility]. [place] office.  A few things to remember from today's visit:  Seroquel 100 mg daily at bedtime for insomnia. Good sleep hygiene. Psychotherapy for anxiety.  If you need refills please call your pharmacy. Do not use My Chart to request refills or for acute issues that need immediate attention.    Please be sure medication list is accurate. If a new problem present, please set up appointment sooner than planned today.

## 2019-08-24 NOTE — Patient Instructions (Addendum)
A few things to remember from today's visit:  Seroquel 100 mg daily at bedtime for insomnia. Good sleep hygiene. Psychotherapy for anxiety.  If you need refills please call your pharmacy. Do not use My Chart to request refills or for acute issues that need immediate attention.    Please be sure medication list is accurate. If a new problem present, please set up appointment sooner than planned today.

## 2019-08-25 ENCOUNTER — Encounter: Payer: Self-pay | Admitting: Family Medicine

## 2019-08-27 ENCOUNTER — Encounter: Payer: Self-pay | Admitting: Family Medicine

## 2019-08-27 LAB — FRUCTOSAMINE: Fructosamine: 316 umol/L — ABNORMAL HIGH (ref 205–285)

## 2019-08-29 NOTE — Telephone Encounter (Signed)
Dr. Martinique answered patient in another message.

## 2019-09-06 ENCOUNTER — Encounter: Payer: Medicare HMO | Admitting: Registered Nurse

## 2019-09-06 ENCOUNTER — Other Ambulatory Visit: Payer: Self-pay

## 2019-09-06 ENCOUNTER — Encounter: Payer: Self-pay | Admitting: Registered Nurse

## 2019-09-06 ENCOUNTER — Encounter: Payer: Medicare HMO | Attending: Physical Medicine & Rehabilitation | Admitting: Registered Nurse

## 2019-09-06 VITALS — BP 134/77 | HR 85 | Temp 98.3°F | Ht 67.5 in | Wt 195.4 lb

## 2019-09-06 DIAGNOSIS — M545 Low back pain, unspecified: Secondary | ICD-10-CM

## 2019-09-06 DIAGNOSIS — E119 Type 2 diabetes mellitus without complications: Secondary | ICD-10-CM | POA: Diagnosis not present

## 2019-09-06 DIAGNOSIS — M542 Cervicalgia: Secondary | ICD-10-CM

## 2019-09-06 DIAGNOSIS — Z79899 Other long term (current) drug therapy: Secondary | ICD-10-CM | POA: Insufficient documentation

## 2019-09-06 DIAGNOSIS — F4310 Post-traumatic stress disorder, unspecified: Secondary | ICD-10-CM | POA: Diagnosis not present

## 2019-09-06 DIAGNOSIS — M5030 Other cervical disc degeneration, unspecified cervical region: Secondary | ICD-10-CM | POA: Insufficient documentation

## 2019-09-06 DIAGNOSIS — M461 Sacroiliitis, not elsewhere classified: Secondary | ICD-10-CM | POA: Diagnosis not present

## 2019-09-06 DIAGNOSIS — M5412 Radiculopathy, cervical region: Secondary | ICD-10-CM

## 2019-09-06 DIAGNOSIS — I10 Essential (primary) hypertension: Secondary | ICD-10-CM | POA: Insufficient documentation

## 2019-09-06 DIAGNOSIS — G2581 Restless legs syndrome: Secondary | ICD-10-CM | POA: Insufficient documentation

## 2019-09-06 DIAGNOSIS — F329 Major depressive disorder, single episode, unspecified: Secondary | ICD-10-CM | POA: Insufficient documentation

## 2019-09-06 DIAGNOSIS — Z79891 Long term (current) use of opiate analgesic: Secondary | ICD-10-CM

## 2019-09-06 DIAGNOSIS — M47812 Spondylosis without myelopathy or radiculopathy, cervical region: Secondary | ICD-10-CM

## 2019-09-06 DIAGNOSIS — R102 Pelvic and perineal pain: Secondary | ICD-10-CM | POA: Diagnosis not present

## 2019-09-06 DIAGNOSIS — E785 Hyperlipidemia, unspecified: Secondary | ICD-10-CM | POA: Insufficient documentation

## 2019-09-06 DIAGNOSIS — T1490XA Injury, unspecified, initial encounter: Secondary | ICD-10-CM | POA: Diagnosis not present

## 2019-09-06 DIAGNOSIS — M25512 Pain in left shoulder: Secondary | ICD-10-CM | POA: Insufficient documentation

## 2019-09-06 DIAGNOSIS — Z5181 Encounter for therapeutic drug level monitoring: Secondary | ICD-10-CM | POA: Diagnosis not present

## 2019-09-06 DIAGNOSIS — G8929 Other chronic pain: Secondary | ICD-10-CM | POA: Diagnosis not present

## 2019-09-06 DIAGNOSIS — M7918 Myalgia, other site: Secondary | ICD-10-CM

## 2019-09-06 DIAGNOSIS — G894 Chronic pain syndrome: Secondary | ICD-10-CM

## 2019-09-06 MED ORDER — HYDROCODONE-ACETAMINOPHEN 5-325 MG PO TABS: 1.0000 | ORAL_TABLET | Freq: Four times a day (QID) | ORAL | 0 refills | Status: DC | PRN
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
## 2019-09-08 LAB — TOXASSURE SELECT,+ANTIDEPR,UR

## 2019-09-09 ENCOUNTER — Ambulatory Visit: Payer: Medicare HMO | Admitting: Orthopedic Surgery

## 2019-09-09 ENCOUNTER — Telehealth: Payer: Self-pay | Admitting: *Deleted
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
## 2019-09-09 NOTE — Telephone Encounter (Signed)
Routed to Dr Naaman Plummer

## 2019-09-09 NOTE — Telephone Encounter (Signed)
Urine drug screen is positive for two benzos, clonazepam and lorazepam.  She is not prescribed lorazepam.  Her hydrocodone is present as well.

## 2019-09-16 ENCOUNTER — Ambulatory Visit: Payer: Medicare HMO | Admitting: Physical Medicine & Rehabilitation

## 2019-09-20 ENCOUNTER — Encounter: Payer: Self-pay | Admitting: Physical Medicine & Rehabilitation

## 2019-09-20 ENCOUNTER — Other Ambulatory Visit: Payer: Self-pay

## 2019-09-20 ENCOUNTER — Encounter: Payer: Medicare HMO | Admitting: Physical Medicine & Rehabilitation

## 2019-09-20 VITALS — BP 138/73 | HR 48 | Temp 97.5°F | Ht 68.0 in | Wt 193.0 lb

## 2019-09-20 DIAGNOSIS — Z79899 Other long term (current) drug therapy: Secondary | ICD-10-CM | POA: Diagnosis not present

## 2019-09-20 DIAGNOSIS — M5030 Other cervical disc degeneration, unspecified cervical region: Secondary | ICD-10-CM | POA: Diagnosis not present

## 2019-09-20 DIAGNOSIS — M542 Cervicalgia: Secondary | ICD-10-CM | POA: Diagnosis not present

## 2019-09-20 DIAGNOSIS — G894 Chronic pain syndrome: Secondary | ICD-10-CM | POA: Diagnosis not present

## 2019-09-20 DIAGNOSIS — M7918 Myalgia, other site: Secondary | ICD-10-CM | POA: Diagnosis not present

## 2019-09-20 DIAGNOSIS — M461 Sacroiliitis, not elsewhere classified: Secondary | ICD-10-CM | POA: Diagnosis not present

## 2019-09-20 DIAGNOSIS — R102 Pelvic and perineal pain: Secondary | ICD-10-CM | POA: Diagnosis not present

## 2019-09-20 DIAGNOSIS — Z79891 Long term (current) use of opiate analgesic: Secondary | ICD-10-CM | POA: Diagnosis not present

## 2019-09-20 DIAGNOSIS — Z5181 Encounter for therapeutic drug level monitoring: Secondary | ICD-10-CM | POA: Diagnosis not present

## 2019-09-20 DIAGNOSIS — G8929 Other chronic pain: Secondary | ICD-10-CM | POA: Diagnosis not present

## 2019-09-20 NOTE — Progress Notes (Signed)
Right sacroiliac injection under fluoroscopic guidance  Indication: Right Low back and buttocks pain not relieved by medication management and other conservative care.  Informed consent was obtained after describing risks and benefits of the procedure with the patient, this includes bleeding, bruising, infection, paralysis and medication side effects. The patient wishes to proceed and has given written consent. The patient was placed in a prone position. The lumbar and sacral area was marked and prepped with Betadine. A 25-gauge 1-1/2 inch needle was inserted into the skin and subcutaneous tissue and 1 mL of 1% lidocaine was injected. Then a 25-gauge 3 inch spinal needle was inserted under fluoroscopic guidance into the Right sacroiliac joint. AP and lateral images were utilized. Isovue 200 x0.5 mL under live fluoroscopy demonstrated no intravascular uptake. Then a solution containing one ML of 6 mgper ml betamethasone and 2 ML of 1% lidocaine MPF was injected x1.5 mL. Patient tolerated the procedure well. Post procedure instructions were given. Please see post procedure form.

## 2019-09-20 NOTE — Patient Instructions (Signed)
Sacroiliac injection was performed today. A combination of a naming medicine plus a cortisone medicine was injected. The injection was done under x-ray guidance. This procedure has been performed to help reduce low back and buttocks pain as well as potentially hip pain. The duration of this injection is variable lasting from hours to  Months. It may repeated if needed. 

## 2019-09-20 NOTE — Progress Notes (Signed)
  PROCEDURE RECORD [facility]   Name: [name] DOB:[birth year] MRN: [id number]  Date:09/20/2019  Physician: Alysia Penna, MD    Nurse/CMA: Kaedence Connelly, CMA  Allergies:  Allergies  Allergen Reactions  . Hydromorphone Nausea And Vomiting and Other (See Comments)    Made the patient feel "spaced out," also Made the patient feel "spaced out," also  . Morphine And Related Itching  . Other Itching  . Penicillins Rash    Has patient had a PCN reaction causing immediate rash, facial/tongue/throat swelling, SOB or lightheadedness with hypotension: Yes Has patient had a PCN reaction causing severe rash involving mucus membranes or skin necrosis: No Has patient had a PCN reaction that required hospitalization: No Has patient had a PCN reaction occurring within the last 10 years: No If all of the above answers are "NO", then may proceed with Cephalosporin use.     Consent Signed: Yes.    Is patient diabetic? Yes.    CBG today? 133  Pregnant: No. LMP: No LMP recorded. Patient has had a hysterectomy. (age 82-55)  Anticoagulants: no Anti-inflammatory: no Antibiotics: no  Procedure: right sacroiliac steroid injection  Position: Prone Start Time: 2:32pm  End Time: 2:37pm  Fluoro Time:   RN/CMA Waldo Damian, CMA Jazmarie Biever, CMA    Time 2:20pm 2::40pm    BP 138/73 141/92    Pulse 48 87    Respirations 14 14    O2 Sat 97 97    S/S 6 6    Pain Level 8/10 6/10     D/C home with self, patient A & O X 3, D/C instructions reviewed, and sits independently.

## 2019-09-21 ENCOUNTER — Telehealth: Payer: Self-pay | Admitting: Registered Nurse

## 2019-09-21 NOTE — Telephone Encounter (Signed)
This provider spoke with Dr Naaman Plummer regarding [name] results and the conversation  I had with [name]. Also [name] sent Dr Naaman Plummer a My-Chart message. [name] will be given a final written letter, and if she has any discrepancies she will be discharged from our office. Left a message for [name] , no answer, awaiting a return call.

## 2019-10-04 ENCOUNTER — Encounter: Payer: Self-pay | Admitting: Family Medicine

## 2019-10-04 DIAGNOSIS — E876 Hypokalemia: Secondary | ICD-10-CM

## 2019-10-04 MED ORDER — POTASSIUM CHLORIDE ER 10 MEQ PO TBCR: 10.0000 meq | EXTENDED_RELEASE_TABLET | Freq: Every day | ORAL | 3 refills | Status: DC

## 2019-10-04 MED ORDER — OMEPRAZOLE 40 MG PO CPDR: 40.0000 mg | DELAYED_RELEASE_CAPSULE | Freq: Every day | ORAL | 3 refills | Status: DC

## 2019-10-05 ENCOUNTER — Other Ambulatory Visit: Payer: Self-pay | Admitting: Family Medicine

## 2019-10-05 DIAGNOSIS — E876 Hypokalemia: Secondary | ICD-10-CM

## 2019-10-12 ENCOUNTER — Ambulatory Visit (INDEPENDENT_AMBULATORY_CARE_PROVIDER_SITE_OTHER): Payer: Medicare HMO

## 2019-10-12 ENCOUNTER — Ambulatory Visit (INDEPENDENT_AMBULATORY_CARE_PROVIDER_SITE_OTHER): Payer: Medicare HMO | Admitting: Orthopedic Surgery

## 2019-10-12 DIAGNOSIS — M545 Low back pain, unspecified: Secondary | ICD-10-CM

## 2019-10-15 ENCOUNTER — Encounter: Payer: Self-pay | Admitting: Orthopedic Surgery

## 2019-10-15 NOTE — Progress Notes (Signed)
Office Visit Note   Patient: [name]           Date of Birth: [birth year]           MRN: [id number] Visit Date: 10/12/2019 Requested by: Martinique, Betty G, MD [address] [place],   [address] PCP: Martinique, Betty G, MD  Subjective: Chief Complaint  Patient presents with  . Right Leg - Pain    HPI: [name] is a patient here to recheck small knot on her back on the left-hand side.  She has had 2 lumbar spine surgeries.  She has had injections with Dr. Tessa Lerner.  She has had repeat injection without relief.  She describes new right-sided symptoms.  She has had left sided surgery in the past.  She has not had an MRI scan in her lumbar spine for 5 years.  She does have a history of L-spine surgery done at [facility].  Reports that the right leg gives way at times.  She is on Norco chronically.              ROS: All systems reviewed are negative as they relate to the chief complaint within the history of present illness.  Patient denies  fevers or chills.   Assessment & Plan: Visit Diagnoses:  1. Low back pain, unspecified back pain laterality, unspecified chronicity, unspecified whether sciatica present     Plan: Impression is low back pain with new right-sided radiculopathy.  Not much weakness but the pain is significant.  She is taking narcotics already which may not accommodate a new source of pain.  Symptoms have been ongoing now for 2 months.  She has tried multiple home exercise programs which she is learned in the past with her prior surgeries.  They have not been helpful.  Best option at this time would be MRI lumbar spine to evaluate source of right-sided radiculopathy with likely ESI to follow.  Follow-Up Instructions: Return for after MRI.   Orders:  Orders Placed This Encounter  Procedures  . XR Lumbar Spine 2-3 Views  . MR Lumbar Spine w/o contrast   No orders of the defined types were placed in this encounter.     Procedures: No  procedures performed   Clinical Data: No additional findings.  Objective: Vital Signs: There were no vitals taken for this visit.  Physical Exam:   Constitutional: Patient appears well-developed HEENT:  Head: Normocephalic Eyes:EOM are normal Neck: Normal range of motion Cardiovascular: Normal rate Pulmonary/chest: Effort normal Neurologic: Patient is alert Skin: Skin is warm Psychiatric: Patient has normal mood and affect    Ortho Exam: Ortho exam demonstrates no nerve root tension signs on the left.  She does have nerve root tension signs on the right.  No muscle atrophy left leg or right leg.  Pedal pulses palpable.  Reflex symmetric.  Patient has some pain with forward lateral bending.  No definite trochanteric tenderness is noted.  No paresthesias on the left-hand side.  Mild right-sided L5 paresthesias present.  Reflexes symmetric bilateral patella and Achilles 1+ out of 4.  Specialty Comments:  MRI Cervical Spine: Multilevel degenerative changes most notably at C4-5.Left shoulder x-ray: Mild degenerative arthropathy of the glenohumeral & AC Joint.  Imaging: No results found.   PMFS History: Patient Active Problem List   Diagnosis Date Noted  . PTSD (post-traumatic stress disorder)   . Polyneuropathy associated with underlying disease ([place]) 07/26/2019  . Allergic rhinitis 07/26/2019  . Atherosclerosis of aorta ([place]) 04/04/2019  .  Myalgia due to statin 11/18/2017  . Anxiety and depression 10/16/2017  . Insomnia 10/16/2017  . Chronic pain syndrome 09/07/2017  . Spinal stenosis of lumbar region 07/08/2017  . DOE (dyspnea on exertion) 12/16/2016  . Family history of early CAD 12/16/2016  . Other chest pain 12/16/2016  . Arthritis of left acromioclavicular joint 08/13/2016  . Left shoulder pain 05/12/2016  . Cervical spondylosis without myelopathy 05/12/2016  . Rotator cuff syndrome, left 05/12/2016  . Restless leg syndrome 05/12/2016  . Primary osteoarthritis,  left hand 05/12/2016  . MELENA 10/18/2008  . GASTROPARESIS 05/30/2008  . Type 2 diabetes mellitus with neurological complications ([place]) 95/12/3265  . HYPERCHOLESTEROLEMIA 05/04/2008  . HYPOKALEMIA 05/04/2008  . GERD 05/04/2008  . Constipation 05/04/2008  . HEMATEMESIS 05/04/2008  . Essential hypertension 05/04/2008   Past Medical History:  Diagnosis Date  . Anxiety   . Arthritis   . Diabetes mellitus without complication ([place])   . Hyperlipidemia   . Hypertension   . PTSD (post-traumatic stress disorder)     Family History  Problem Relation Age of Onset  . Diabetes Mother   . Alzheimer's disease Mother   . Lung cancer Father   . Esophageal cancer Brother   . Colon cancer Neg Hx     Past Surgical History:  Procedure Laterality Date  . ACHILLES TENDON SURGERY Left   . BACK SURGERY    . BLADDER SUSPENSION    . CHOLECYSTECTOMY    . NOSE SURGERY     x 2   . OTHER SURGICAL HISTORY     Breast Duct removed  . PARTIAL HYSTERECTOMY    . RECTOCELE REPAIR    . VEIN SURGERY Right    leg   Social History   Occupational History  . Not on file  Tobacco Use  . Smoking status: Never Smoker  . Smokeless tobacco: Never Used  Vaping Use  . Vaping Use: Never used  Substance and Sexual Activity  . Alcohol use: No  . Drug use: No  . Sexual activity: Not on file

## 2019-10-24 ENCOUNTER — Encounter: Payer: Self-pay | Admitting: Family Medicine

## 2019-10-25 ENCOUNTER — Other Ambulatory Visit: Payer: Self-pay | Admitting: *Deleted

## 2019-10-25 DIAGNOSIS — G2581 Restless legs syndrome: Secondary | ICD-10-CM

## 2019-10-25 MED ORDER — GABAPENTIN 400 MG PO CAPS: 400.0000 mg | ORAL_CAPSULE | Freq: Three times a day (TID) | ORAL | 3 refills | Status: DC

## 2019-11-01 ENCOUNTER — Encounter: Payer: Self-pay | Admitting: Orthopedic Surgery

## 2019-11-03 ENCOUNTER — Encounter: Payer: Self-pay | Admitting: Orthopedic Surgery

## 2019-11-04 ENCOUNTER — Encounter: Payer: Medicare HMO | Attending: Physical Medicine & Rehabilitation | Admitting: Registered Nurse

## 2019-11-04 ENCOUNTER — Other Ambulatory Visit: Payer: Self-pay

## 2019-11-04 ENCOUNTER — Encounter: Payer: Self-pay | Admitting: Registered Nurse

## 2019-11-04 VITALS — BP 125/74 | HR 80 | Temp 98.6°F | Ht 68.0 in | Wt 194.0 lb

## 2019-11-04 DIAGNOSIS — M5416 Radiculopathy, lumbar region: Secondary | ICD-10-CM

## 2019-11-04 DIAGNOSIS — Z5181 Encounter for therapeutic drug level monitoring: Secondary | ICD-10-CM | POA: Diagnosis not present

## 2019-11-04 DIAGNOSIS — E785 Hyperlipidemia, unspecified: Secondary | ICD-10-CM | POA: Insufficient documentation

## 2019-11-04 DIAGNOSIS — F4310 Post-traumatic stress disorder, unspecified: Secondary | ICD-10-CM | POA: Insufficient documentation

## 2019-11-04 DIAGNOSIS — G8929 Other chronic pain: Secondary | ICD-10-CM | POA: Diagnosis not present

## 2019-11-04 DIAGNOSIS — M25512 Pain in left shoulder: Secondary | ICD-10-CM | POA: Diagnosis not present

## 2019-11-04 DIAGNOSIS — E119 Type 2 diabetes mellitus without complications: Secondary | ICD-10-CM | POA: Diagnosis not present

## 2019-11-04 DIAGNOSIS — M5412 Radiculopathy, cervical region: Secondary | ICD-10-CM

## 2019-11-04 DIAGNOSIS — M461 Sacroiliitis, not elsewhere classified: Secondary | ICD-10-CM

## 2019-11-04 DIAGNOSIS — G2581 Restless legs syndrome: Secondary | ICD-10-CM | POA: Insufficient documentation

## 2019-11-04 DIAGNOSIS — M542 Cervicalgia: Secondary | ICD-10-CM | POA: Diagnosis not present

## 2019-11-04 DIAGNOSIS — R102 Pelvic and perineal pain: Secondary | ICD-10-CM | POA: Diagnosis not present

## 2019-11-04 DIAGNOSIS — T1490XA Injury, unspecified, initial encounter: Secondary | ICD-10-CM | POA: Insufficient documentation

## 2019-11-04 DIAGNOSIS — G894 Chronic pain syndrome: Secondary | ICD-10-CM | POA: Diagnosis not present

## 2019-11-04 DIAGNOSIS — M545 Low back pain, unspecified: Secondary | ICD-10-CM

## 2019-11-04 DIAGNOSIS — I10 Essential (primary) hypertension: Secondary | ICD-10-CM | POA: Insufficient documentation

## 2019-11-04 DIAGNOSIS — M7918 Myalgia, other site: Secondary | ICD-10-CM | POA: Diagnosis not present

## 2019-11-04 DIAGNOSIS — F329 Major depressive disorder, single episode, unspecified: Secondary | ICD-10-CM | POA: Insufficient documentation

## 2019-11-04 DIAGNOSIS — M5030 Other cervical disc degeneration, unspecified cervical region: Secondary | ICD-10-CM | POA: Diagnosis not present

## 2019-11-04 DIAGNOSIS — Z79891 Long term (current) use of opiate analgesic: Secondary | ICD-10-CM | POA: Insufficient documentation

## 2019-11-04 DIAGNOSIS — Z79899 Other long term (current) drug therapy: Secondary | ICD-10-CM | POA: Insufficient documentation

## 2019-11-04 MED ORDER — HYDROCODONE-ACETAMINOPHEN 7.5-325 MG PO TABS: 1.0000 | ORAL_TABLET | Freq: Four times a day (QID) | ORAL | 0 refills | Status: DC | PRN
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
## 2019-11-06 ENCOUNTER — Ambulatory Visit
Admission: RE | Admit: 2019-11-06 | Discharge: 2019-11-06 | Disposition: A | Discharge status: Home / Self Care | Payer: Medicare HMO | Source: Ambulatory Visit | Attending: Orthopedic Surgery | Admitting: Orthopedic Surgery

## 2019-11-06 DIAGNOSIS — M48061 Spinal stenosis, lumbar region without neurogenic claudication: Secondary | ICD-10-CM | POA: Diagnosis not present

## 2019-11-06 DIAGNOSIS — M545 Low back pain, unspecified: Secondary | ICD-10-CM

## 2019-11-07 NOTE — Telephone Encounter (Signed)
Scan ok somewhat underwhelming

## 2019-11-09 ENCOUNTER — Telehealth: Payer: Self-pay | Admitting: Orthopedic Surgery

## 2019-11-09 ENCOUNTER — Encounter: Payer: Self-pay | Admitting: Orthopedic Surgery

## 2019-11-09 DIAGNOSIS — M545 Low back pain, unspecified: Secondary | ICD-10-CM

## 2019-11-09 NOTE — Telephone Encounter (Signed)
MRI done on 08/02. Can you please advise on results? Thanks.

## 2019-11-09 NOTE — Telephone Encounter (Signed)
Patient called requesting an earlier appt for MRI results. Patient also is requesting a reading of MRI over the phone. The patient is stating she is in severe pain. Please call patient at [phone]. Patient was very irritated that she could not get a sooner appt.

## 2019-11-09 NOTE — Telephone Encounter (Signed)
I called plas refer to gary cram who did her surgery about 5 years ago.  She had an epidural steroid injection in June which did not help.  She needs surgical referral because there are some findings on the MRI scan which he needs to evaluate and he  needs to see her.  Thanks

## 2019-11-11 ENCOUNTER — Encounter: Payer: Self-pay | Admitting: Orthopedic Surgery

## 2019-11-15 ENCOUNTER — Encounter: Payer: Self-pay | Admitting: Orthopedic Surgery

## 2019-11-15 NOTE — Telephone Encounter (Signed)
MRI generally looks okay.  She does have some residual compression at multiple levels but nothing really emergent.  I would go with injections and then if that does not help she may need to see Lorin Mercy or Nitka for consideration of further decompression.  Thanks

## 2019-11-16 ENCOUNTER — Other Ambulatory Visit: Payer: Self-pay | Admitting: Family Medicine

## 2019-11-16 ENCOUNTER — Encounter: Payer: Self-pay | Admitting: Orthopedic Surgery

## 2019-11-16 NOTE — Telephone Encounter (Signed)
I called and left message on machine.  She did not pick up.  Basically said injection is worth trying but if it did not help on June 15 then seeing a neurosurgeon to see about repeat surgery is the next step

## 2019-11-20 ENCOUNTER — Encounter: Payer: Self-pay | Admitting: Orthopedic Surgery

## 2019-11-20 DIAGNOSIS — M545 Low back pain, unspecified: Secondary | ICD-10-CM

## 2019-11-21 ENCOUNTER — Encounter: Payer: Self-pay | Admitting: Orthopedic Surgery

## 2019-11-21 NOTE — Telephone Encounter (Signed)
Ok thx.

## 2019-11-21 NOTE — Telephone Encounter (Signed)
No thx

## 2019-11-22 NOTE — Telephone Encounter (Signed)
Depending on how long it took for referral shot may bridge but with quick appt with [facility] and severity of compression I agree with her to hold off on shot pending eval with cram thx

## 2019-11-24 DIAGNOSIS — M5440 Lumbago with sciatica, unspecified side: Secondary | ICD-10-CM | POA: Diagnosis not present

## 2019-11-30 DIAGNOSIS — H524 Presbyopia: Secondary | ICD-10-CM | POA: Diagnosis not present

## 2019-11-30 DIAGNOSIS — H5203 Hypermetropia, bilateral: Secondary | ICD-10-CM | POA: Diagnosis not present

## 2019-11-30 DIAGNOSIS — H2513 Age-related nuclear cataract, bilateral: Secondary | ICD-10-CM | POA: Diagnosis not present

## 2019-11-30 DIAGNOSIS — H16223 Keratoconjunctivitis sicca, not specified as Sjogren's, bilateral: Secondary | ICD-10-CM | POA: Diagnosis not present

## 2019-11-30 DIAGNOSIS — H353131 Nonexudative age-related macular degeneration, bilateral, early dry stage: Secondary | ICD-10-CM | POA: Diagnosis not present

## 2019-11-30 DIAGNOSIS — H52223 Regular astigmatism, bilateral: Secondary | ICD-10-CM | POA: Diagnosis not present

## 2019-11-30 DIAGNOSIS — E119 Type 2 diabetes mellitus without complications: Secondary | ICD-10-CM | POA: Diagnosis not present

## 2019-11-30 DIAGNOSIS — Z7984 Long term (current) use of oral hypoglycemic drugs: Secondary | ICD-10-CM | POA: Diagnosis not present

## 2019-11-30 LAB — HM DIABETES EYE EXAM

## 2019-12-01 ENCOUNTER — Other Ambulatory Visit: Payer: Self-pay | Admitting: Student

## 2019-12-01 DIAGNOSIS — M5440 Lumbago with sciatica, unspecified side: Secondary | ICD-10-CM

## 2019-12-25 ENCOUNTER — Other Ambulatory Visit: Payer: Self-pay

## 2019-12-25 ENCOUNTER — Ambulatory Visit
Admission: RE | Admit: 2019-12-25 | Discharge: 2019-12-25 | Disposition: A | Discharge status: Home / Self Care | Payer: Medicare HMO | Source: Ambulatory Visit | Attending: Student | Admitting: Student

## 2019-12-25 DIAGNOSIS — M5126 Other intervertebral disc displacement, lumbar region: Secondary | ICD-10-CM | POA: Diagnosis not present

## 2019-12-25 DIAGNOSIS — M4319 Spondylolisthesis, multiple sites in spine: Secondary | ICD-10-CM | POA: Diagnosis not present

## 2019-12-25 DIAGNOSIS — M5440 Lumbago with sciatica, unspecified side: Secondary | ICD-10-CM

## 2019-12-25 DIAGNOSIS — M48061 Spinal stenosis, lumbar region without neurogenic claudication: Secondary | ICD-10-CM | POA: Diagnosis not present

## 2019-12-25 DIAGNOSIS — M4807 Spinal stenosis, lumbosacral region: Secondary | ICD-10-CM | POA: Diagnosis not present

## 2019-12-25 MED ORDER — GADOBENATE DIMEGLUMINE 529 MG/ML IV SOLN
17.0000 mL | Freq: Once | INTRAVENOUS | Status: AC | PRN
  Administered 2019-12-25: 17 mL via INTRAVENOUS

## 2019-12-26 ENCOUNTER — Ambulatory Visit: Payer: Medicare HMO | Admitting: Family Medicine

## 2019-12-27 ENCOUNTER — Ambulatory Visit (INDEPENDENT_AMBULATORY_CARE_PROVIDER_SITE_OTHER): Payer: Medicare HMO | Admitting: Family Medicine

## 2019-12-27 ENCOUNTER — Other Ambulatory Visit: Payer: Self-pay

## 2019-12-27 ENCOUNTER — Encounter: Payer: Self-pay | Admitting: Family Medicine

## 2019-12-27 VITALS — BP 118/70 | HR 86 | Temp 97.9°F | Resp 16 | Ht 68.0 in | Wt 191.0 lb

## 2019-12-27 DIAGNOSIS — M5440 Lumbago with sciatica, unspecified side: Secondary | ICD-10-CM | POA: Diagnosis not present

## 2019-12-27 DIAGNOSIS — G4700 Insomnia, unspecified: Secondary | ICD-10-CM | POA: Diagnosis not present

## 2019-12-27 DIAGNOSIS — E1149 Type 2 diabetes mellitus with other diabetic neurological complication: Secondary | ICD-10-CM | POA: Diagnosis not present

## 2019-12-27 DIAGNOSIS — I10 Essential (primary) hypertension: Secondary | ICD-10-CM | POA: Diagnosis not present

## 2019-12-27 DIAGNOSIS — F4310 Post-traumatic stress disorder, unspecified: Secondary | ICD-10-CM | POA: Diagnosis not present

## 2019-12-27 DIAGNOSIS — E876 Hypokalemia: Secondary | ICD-10-CM

## 2019-12-27 DIAGNOSIS — K120 Recurrent oral aphthae: Secondary | ICD-10-CM

## 2019-12-27 DIAGNOSIS — F3341 Major depressive disorder, recurrent, in partial remission: Secondary | ICD-10-CM

## 2019-12-27 DIAGNOSIS — G63 Polyneuropathy in diseases classified elsewhere: Secondary | ICD-10-CM

## 2019-12-27 MED ORDER — TRIAMCINOLONE ACETONIDE 0.1 % MT PSTE: 1.0000 "application " | PASTE | Freq: Two times a day (BID) | OROMUCOSAL | 1 refills | Status: DC

## 2019-12-27 NOTE — Patient Instructions (Addendum)
A few things to remember from today's visit:  Type 2 diabetes mellitus with neurological complications ([place]) - Plan: Hemoglobin R6F, BASIC METABOLIC PANEL WITH GFR, Microalbumin / creatinine urine ratio  Essential hypertension - Plan: BASIC METABOLIC PANEL WITH GFR  Hypokalemia  Polyneuropathy associated with underlying disease ([facility])  Aphthae, oral - Plan: triamcinolone (KENALOG) 0.1 % paste Triamcinolone paste on oral ulcers.  If you need refills please call your pharmacy. Do not use My Chart to request refills or for acute issues that need immediate attention.    Please be sure medication list is accurate. If a new problem present, please set up appointment sooner than planned today.

## 2019-12-27 NOTE — Progress Notes (Signed)
HPI: [name] is a 69 y.o. female, who is here today for chronic disease management.  She was last seen on 08/24/19. She saw her neurosurgeon today and lumbar spine surgery was recommended. Back pain is getting worse. She has had 2 prior surgeries.  DM2: Currently she is on Trulicity 1.5 mg weekly. FG 130's.Denies abdominal pain, nausea,vomiting, polydipsia,polyuria, or polyphagia.  Last eye exam a months ago, no retinopathy seen.  Lab Results  Component Value Date   HGBA1C 7.1 (H) 08/24/2019   Peripheral neuropathy: She is on gabapentin 400 mg 3 times daily.  Depression and anxiety (PSTD): Currently she is on clonazepam 1 mg twice daily and Seroquel 100 mg daily, the latter one helps her sleep as well. She is taking Seroquel 100 mg 1/2 tab at bedtime.  She is sleeping about 8 hours. She is on chronic opioid use for chronic pain. She has tolerated meds well, no side effects.  Her mother died in 07/30/2019, she was in a NH, hx of dementia and fracture her hip. She cries sometimes but in general she is dealing well with her loss.   Hypertension: She is on HCTZ 25 mg daily. Hypokalemia, she is taking K-Lor 10 mEq daily.  Negative for severe/frequent headache, visual changes, chest pain, dyspnea, palpitation, focal weakness, or edema.  Component     Latest Ref Rng & Units 08/24/2019  Sodium     135 - 146 mmol/L 143  Potassium     3.5 - 5.3 mmol/L 4.4  Chloride     98 - 110 mmol/L 104  CO2     20 - 32 mmol/L 31  Glucose     65 - 99 mg/dL 148 (H)  BUN     7 - 25 mg/dL 15  Creatinine     0.50 - 0.99 mg/dL 1.01  Calcium     8.6 - 10.4 mg/dL 9.5  GFR     >60.00 mL/min 54.57 (L)   Tender lesion on mucosa of lower lip, very tender. Pain is exacerbated by contact with food. Negative for fever,sore throat. She tried OTC topical medication.  She has had a few in the past.  Review of Systems  Constitutional: Negative for activity change, appetite  change and fever.  HENT: Negative for mouth sores, nosebleeds and sore throat.   Respiratory: Negative for cough and wheezing.   Gastrointestinal: Negative for abdominal pain, nausea and vomiting.       Negative for changes in bowel habits.  Genitourinary: Negative for decreased urine volume, dysuria and hematuria.  Musculoskeletal: Positive for arthralgias and back pain.  Skin: Negative for rash and wound.  Neurological: Negative for syncope and facial asymmetry.  Psychiatric/Behavioral: Negative for confusion and hallucinations. The patient is nervous/anxious.   Rest of ROS, see pertinent positives sand negatives in HPI  Current Outpatient Medications on File Prior to Visit  Medication Sig Dispense Refill  . ACCU-CHEK SOFTCLIX LANCETS lancets Use to test blood sugar 3 times daily. Dx: E11.8 100 each 12  . albuterol (VENTOLIN HFA) 108 (90 Base) MCG/ACT inhaler INHALE 2 PUFFS INTO THE LUNGS EVERY 6 HOURS AS NEEDED FOR WHEEZING OR SHORTNESS OF BREATH 54 g 0  . Alcohol Swabs (B-D SINGLE USE SWABS REGULAR) PADS Use to test blood sugar 3 times daily 100 each 3  . Blood Glucose Calibration (ACCU-CHEK AVIVA) SOLN Use as directed 1 each 2  . Blood Glucose Monitoring Suppl (ACCU-CHEK AVIVA PLUS) w/Device KIT Use to check blood  sugar 3 times daily 1 kit 1  . cyanocobalamin 100 MCG tablet Take 500 mcg by mouth daily.     . Dulaglutide (TRULICITY) 1.5 QA/8.3MH SOPN Inject 1.5 mg into the skin once a week. 13 pen 3  . gabapentin (NEURONTIN) 400 MG capsule Take 1 capsule (400 mg total) by mouth 3 (three) times daily. 90 capsule 3  . glucose blood (ACCU-CHEK AVIVA) test strip Use to test blood sugar 3 times daily. Dx: 11.8 100 each 12  . hydrochlorothiazide (HYDRODIURIL) 25 MG tablet Take 1 tablet (25 mg total) by mouth daily. 90 tablet 3  . HYDROcodone-acetaminophen (NORCO) 7.5-325 MG tablet Take 1 tablet by mouth every 6 (six) hours as needed for moderate pain. 120 tablet 0  . ipratropium (ATROVENT)  0.06 % nasal spray Place 2 sprays into both nostrils 4 (four) times daily. 15 mL 3  . Olopatadine HCl (PAZEO) 0.7 % SOLN Apply 1 drop to eye daily. 2.5 mL 1  . omeprazole (PRILOSEC) 40 MG capsule Take 1 capsule (40 mg total) by mouth daily. 90 capsule 3  . potassium chloride (KLOR-CON) 10 MEQ tablet TAKE 1 TABLET BY MOUTH EVERY DAY 90 tablet 2   No current facility-administered medications on file prior to visit.     Past Medical History:  Diagnosis Date  . Anxiety   . Arthritis   . Diabetes mellitus without complication ([place])   . Hyperlipidemia   . Hypertension   . PTSD (post-traumatic stress disorder)    Allergies  Allergen Reactions  . Hydromorphone Nausea And Vomiting and Other (See Comments)    Made the patient feel "spaced out," also Made the patient feel "spaced out," also  . Morphine And Related Itching  . Other Itching  . Penicillins Rash    Has patient had a PCN reaction causing immediate rash, facial/tongue/throat swelling, SOB or lightheadedness with hypotension: Yes Has patient had a PCN reaction causing severe rash involving mucus membranes or skin necrosis: No Has patient had a PCN reaction that required hospitalization: No Has patient had a PCN reaction occurring within the last 10 years: No If all of the above answers are "NO", then may proceed with Cephalosporin use.     Social History   Socioeconomic History  . Marital status: Divorced    Spouse name: Not on file  . Number of children: Not on file  . Years of education: Not on file  . Highest education level: Not on file  Occupational History  . Not on file  Tobacco Use  . Smoking status: Never Smoker  . Smokeless tobacco: Never Used  Vaping Use  . Vaping Use: Never used  Substance and Sexual Activity  . Alcohol use: No  . Drug use: No  . Sexual activity: Not on file  Other Topics Concern  . Not on file  Social History Narrative  . Not on file   Social Determinants of Health   Financial  Resource Strain: Low Risk   . Difficulty of Paying Living Expenses: Not hard at all  Food Insecurity:   . Worried About Charity fundraiser in the Last Year: Not on file  . Ran Out of Food in the Last Year: Not on file  Transportation Needs: No Transportation Needs  . Lack of Transportation (Medical): No  . Lack of Transportation (Non-Medical): No  Physical Activity:   . Days of Exercise per Week: Not on file  . Minutes of Exercise per Session: Not on file  Stress:   .  Feeling of Stress : Not on file  Social Connections:   . Frequency of Communication with Friends and Family: Not on file  . Frequency of Social Gatherings with Friends and Family: Not on file  . Attends Religious Services: Not on file  . Active Member of Clubs or Organizations: Not on file  . Attends Archivist Meetings: Not on file  . Marital Status: Not on file   Vitals:   12/27/19 1401  BP: 118/70  Pulse: 86  Resp: 16  Temp: 97.9 F (36.6 C)  SpO2: 97%   Body mass index is 29.04 kg/m.   Physical Exam Vitals and nursing note reviewed.  Constitutional:      General: She is not in acute distress.    Appearance: She is well-developed.  HENT:     Head: Normocephalic and atraumatic.     Mouth/Throat:     Mouth: Mucous membranes are moist.     Pharynx: Oropharynx is clear.  Eyes:     Conjunctiva/sclera: Conjunctivae normal.     Pupils: Pupils are equal, round, and reactive to light.  Cardiovascular:     Rate and Rhythm: Normal rate and regular rhythm. Occasional extrasystoles are present.    Pulses:          Dorsalis pedis pulses are 2+ on the right side and 2+ on the left side.     Heart sounds: Murmur (SEM I/VI RUSB) heard.   Pulmonary:     Effort: Pulmonary effort is normal. No respiratory distress.     Breath sounds: Normal breath sounds.  Abdominal:     Palpations: Abdomen is soft. There is no hepatomegaly or mass.     Tenderness: There is no abdominal tenderness.  Lymphadenopathy:       Cervical: No cervical adenopathy.  Skin:    General: Skin is warm.     Findings: No erythema or rash.  Neurological:     General: No focal deficit present.     Mental Status: She is alert and oriented to person, place, and time.     Cranial Nerves: No cranial nerve deficit.     Gait: Gait normal.  Psychiatric:        Mood and Affect: Mood is anxious. Affect is labile.     Comments: Well groomed, good eye contact.   ASSESSMENT AND PLAN:  [name] was seen today for chronic disease management.  Orders Placed This Encounter  Procedures  . Hemoglobin A1c  . BASIC METABOLIC PANEL WITH GFR  . Microalbumin / creatinine urine ratio   Lab Results  Component Value Date   HGBA1C 7.5 (H) 12/27/2019   Lab Results  Component Value Date   MICROALBUR 0.8 12/27/2019   MICROALBUR <0.7 04/04/2019    Lab Results  Component Value Date   CREATININE 0.88 12/27/2019   BUN 14 12/27/2019   NA 142 12/27/2019   K 4.2 12/27/2019   CL 101 12/27/2019   CO2 30 12/27/2019   Type 2 diabetes mellitus with neurological complications ([place]) ONG2X has not been at goal. No changes in current management, will adjust treatment according to HgA1C result. Regular exercise and healthy diet with avoidance of added sugar food intake. Annual eye exam and foot care recommended. F/U in 5-6 months  Essential hypertension BP adequately controlled. No changes in current management. Continue low salt diet.  Hypokalemia Continue K+ supplementation.Poor tolerance to other antihypertensive therapies, so continue HCTZ. Further recommendations according to BMP result.  Polyneuropathy associated with  underlying disease ([place]) Still symptomatic but she thinks it may be helping. Continue Gabapentin 300  Aphthae, oral Lesion does not seem suspicious. Topical triamcinolone paste may help. If lesion does not heal in a few weeks, she needs to follow.  -     triamcinolone (KENALOG) 0.1 % paste; Use  as directed 1 application in the mouth or throat 2 (two) times daily.  PTSD (post-traumatic stress disorder) Stable. Continue Clonazepam 1 mg bid prn and Seroquel. [place] controlled subs report reviewed.  Depression, major, recurrent, in partial remission ([place]) Stable. Continue Seroquel 100 mg 1/2 tab daily. CBT may also help.  - QUEtiapine (SEROQUEL) 100 MG tablet; Take 0.5 tablets (50 mg total) by mouth at bedtime.  Dispense: 45 tablet; Refill: 1  Insomnia, unspecified type  Better controlled. Continue Seroquel 50 mg daily and Clonazepam. Good sleep hygiene.  - QUEtiapine (SEROQUEL) 100 MG tablet; Take 0.5 tablets (50 mg total) by mouth at bedtime.  Dispense: 45 tablet; Refill: 1  Spent 45 minutes with pt.  During this time history was obtained and documented, examination was performed, prior labs, and discussing assessment/plan.   Return in about 5 months (around 05/28/2020).   Husayn Reim G. Martinique, MD  [facility]. [place] office.   A few things to remember from today's visit:  Type 2 diabetes mellitus with neurological complications ([place]) - Plan: Hemoglobin B9T, BASIC METABOLIC PANEL WITH GFR, Microalbumin / creatinine urine ratio  Essential hypertension - Plan: BASIC METABOLIC PANEL WITH GFR  Hypokalemia  Polyneuropathy associated with underlying disease ([facility])  Aphthae, oral - Plan: triamcinolone (KENALOG) 0.1 % paste Triamcinolone paste on oral ulcers.  If you need refills please call your pharmacy. Do not use My Chart to request refills or for acute issues that need immediate attention.    Please be sure medication list is accurate. If a new problem present, please set up appointment sooner than planned today.

## 2019-12-28 ENCOUNTER — Other Ambulatory Visit: Payer: Self-pay | Admitting: Neurosurgery

## 2019-12-28 ENCOUNTER — Encounter: Payer: Self-pay | Admitting: Family Medicine

## 2019-12-28 ENCOUNTER — Other Ambulatory Visit: Payer: Self-pay | Admitting: Family Medicine

## 2019-12-28 DIAGNOSIS — F419 Anxiety disorder, unspecified: Secondary | ICD-10-CM

## 2019-12-28 DIAGNOSIS — F32A Depression, unspecified: Secondary | ICD-10-CM

## 2019-12-28 LAB — HEMOGLOBIN A1C
Hgb A1c MFr Bld: 7.5 %{Hb} — ABNORMAL HIGH
Mean Plasma Glucose: 169 (calc)
eAG (mmol/L): 9.3 (calc)

## 2019-12-28 LAB — BASIC METABOLIC PANEL WITHOUT GFR
BUN: 14 mg/dL (ref 7–25)
CO2: 30 mmol/L (ref 20–32)
Calcium: 9.8 mg/dL (ref 8.6–10.4)
Chloride: 101 mmol/L (ref 98–110)
Creat: 0.88 mg/dL (ref 0.50–0.99)
GFR, Est African American: 79 mL/min/1.73m2
GFR, Est Non African American: 68 mL/min/1.73m2
Glucose, Bld: 234 mg/dL — ABNORMAL HIGH (ref 65–99)
Potassium: 4.2 mmol/L (ref 3.5–5.3)
Sodium: 142 mmol/L (ref 135–146)

## 2019-12-28 LAB — MICROALBUMIN / CREATININE URINE RATIO
Creatinine, Urine: 178 mg/dL (ref 20–275)
Microalb Creat Ratio: 4 mcg/mg creat (ref <30)
Microalb, Ur: 0.8 mg/dL

## 2019-12-29 ENCOUNTER — Encounter: Payer: Self-pay | Admitting: Family Medicine

## 2019-12-30 ENCOUNTER — Other Ambulatory Visit: Payer: Self-pay

## 2019-12-30 ENCOUNTER — Encounter: Payer: Medicare HMO | Attending: Physical Medicine & Rehabilitation | Admitting: Registered Nurse

## 2019-12-30 ENCOUNTER — Encounter: Payer: Self-pay | Admitting: Registered Nurse

## 2019-12-30 VITALS — BP 154/91 | HR 91 | Temp 98.7°F | Ht 68.0 in | Wt 192.4 lb

## 2019-12-30 DIAGNOSIS — M5030 Other cervical disc degeneration, unspecified cervical region: Secondary | ICD-10-CM | POA: Insufficient documentation

## 2019-12-30 DIAGNOSIS — T1490XA Injury, unspecified, initial encounter: Secondary | ICD-10-CM | POA: Insufficient documentation

## 2019-12-30 DIAGNOSIS — M545 Low back pain: Secondary | ICD-10-CM | POA: Diagnosis not present

## 2019-12-30 DIAGNOSIS — Z79891 Long term (current) use of opiate analgesic: Secondary | ICD-10-CM | POA: Insufficient documentation

## 2019-12-30 DIAGNOSIS — G8929 Other chronic pain: Secondary | ICD-10-CM | POA: Diagnosis not present

## 2019-12-30 DIAGNOSIS — M25512 Pain in left shoulder: Secondary | ICD-10-CM | POA: Diagnosis not present

## 2019-12-30 DIAGNOSIS — M47812 Spondylosis without myelopathy or radiculopathy, cervical region: Secondary | ICD-10-CM

## 2019-12-30 DIAGNOSIS — Z79899 Other long term (current) drug therapy: Secondary | ICD-10-CM | POA: Diagnosis not present

## 2019-12-30 DIAGNOSIS — G894 Chronic pain syndrome: Secondary | ICD-10-CM | POA: Insufficient documentation

## 2019-12-30 DIAGNOSIS — F4310 Post-traumatic stress disorder, unspecified: Secondary | ICD-10-CM | POA: Insufficient documentation

## 2019-12-30 DIAGNOSIS — M542 Cervicalgia: Secondary | ICD-10-CM | POA: Insufficient documentation

## 2019-12-30 DIAGNOSIS — M7918 Myalgia, other site: Secondary | ICD-10-CM | POA: Insufficient documentation

## 2019-12-30 DIAGNOSIS — E785 Hyperlipidemia, unspecified: Secondary | ICD-10-CM | POA: Insufficient documentation

## 2019-12-30 DIAGNOSIS — I10 Essential (primary) hypertension: Secondary | ICD-10-CM | POA: Insufficient documentation

## 2019-12-30 DIAGNOSIS — Z5181 Encounter for therapeutic drug level monitoring: Secondary | ICD-10-CM

## 2019-12-30 DIAGNOSIS — R102 Pelvic and perineal pain: Secondary | ICD-10-CM | POA: Diagnosis not present

## 2019-12-30 DIAGNOSIS — F329 Major depressive disorder, single episode, unspecified: Secondary | ICD-10-CM | POA: Diagnosis not present

## 2019-12-30 DIAGNOSIS — G2581 Restless legs syndrome: Secondary | ICD-10-CM | POA: Insufficient documentation

## 2019-12-30 DIAGNOSIS — M5416 Radiculopathy, lumbar region: Secondary | ICD-10-CM | POA: Diagnosis not present

## 2019-12-30 DIAGNOSIS — M5412 Radiculopathy, cervical region: Secondary | ICD-10-CM | POA: Diagnosis not present

## 2019-12-30 DIAGNOSIS — E119 Type 2 diabetes mellitus without complications: Secondary | ICD-10-CM | POA: Diagnosis not present

## 2019-12-30 MED ORDER — GLIPIZIDE 5 MG PO TABS
5.0000 mg | ORAL_TABLET | Freq: Every day | ORAL | 3 refills | Status: DC
Start: 2019-12-30 — End: 2020-05-14

## 2019-12-30 MED ORDER — HYDROCODONE-ACETAMINOPHEN 7.5-325 MG PO TABS: 1.0000 | ORAL_TABLET | Freq: Four times a day (QID) | ORAL | 0 refills | Status: DC | PRN

## 2019-12-30 MED ORDER — QUETIAPINE FUMARATE 100 MG PO TABS: 50.0000 mg | ORAL_TABLET | Freq: Every day | ORAL | 1 refills | Status: DC
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
## 2020-01-05 DIAGNOSIS — M5440 Lumbago with sciatica, unspecified side: Secondary | ICD-10-CM | POA: Diagnosis not present

## 2020-01-09 NOTE — Pre-Procedure Instructions (Signed)
Your procedure is scheduled on Friday, October 8, from 07:30 AM- 10:40 AM.  Report to [name] Main Entrance "A" at 05:30 A.M., and check in at the Admitting office.  Call this number if you have problems the morning of surgery:  [phone]  Call [phone] if you have any questions prior to your surgery date Monday-Friday 8am-4pm.    Remember:  Do not eat or drink after midnight the night before your surgery.     Take these medicines the morning of surgery with A SIP OF WATER: omeprazole (PRILOSEC)  IF NEEDED: clonazePAM (KLONOPIN) HYDROcodone-acetaminophen (NORCO)  albuterol (VENTOLIN HFA) inhaler-  bring with you the day of surgery  ipratropium (ATROVENT) nasal spray Olopatadine HCl (PAZEO) eye drops   As of today, STOP taking any Aspirin (unless otherwise instructed by your surgeon) Aleve, Naproxen, Ibuprofen, Motrin, Advil, Goody's, BC's, all herbal medications, fish oil, and all vitamins.   WHAT DO I DO ABOUT MY DIABETES MEDICATION?  THE MORNING OF SURGERY: . Do not take oral diabetes medicines (pills) --glipiZIDE (GLUCOTROL). . Do not take other diabetes injectables, including Dulaglutide (TRULICITY).   HOW TO MANAGE YOUR DIABETES BEFORE AND AFTER SURGERY  Why is it important to control my blood sugar before and after surgery? . Improving blood sugar levels before and after surgery helps healing and can limit problems. . A way of improving blood sugar control is eating a healthy diet by: o  Eating less sugar and carbohydrates o  Increasing activity/exercise o  Talking with your doctor about reaching your blood sugar goals . High blood sugars (greater than 180 mg/dL) can raise your risk of infections and slow your recovery, so you will need to focus on controlling your diabetes during the weeks before surgery. . Make sure that the doctor who takes care of your diabetes knows about your planned surgery including the date and location.  How do I manage my  blood sugar before surgery? . Check your blood sugar at least 4 times a day, starting 2 days before surgery, to make sure that the level is not too high or low. . Check your blood sugar the morning of your surgery when you wake up and every 2 hours until you get to the Short Stay unit. o If your blood sugar is less than 70 mg/dL, you will need to treat for low blood sugar: - Do not take insulin. - Treat a low blood sugar (less than 70 mg/dL) with  cup of clear juice (cranberry or apple), 4 glucose tablets, OR glucose gel. - Recheck blood sugar in 15 minutes after treatment (to make sure it is greater than 70 mg/dL). If your blood sugar is not greater than 70 mg/dL on recheck, call [phone] for further instructions. . Report your blood sugar to the short stay nurse when you get to Short Stay.  . If you are admitted to the hospital after surgery: o Your blood sugar will be checked by the staff and you will probably be given insulin after surgery (instead of oral diabetes medicines) to make sure you have good blood sugar levels. o The goal for blood sugar control after surgery is 80-180 mg/dL.           The Morning of Surgery:            Do not wear jewelry, make up, or nail polish.            Do not wear lotions, powders, perfumes, or deodorant.  Do not shave 48 hours prior to surgery.              Do not bring valuables to the hospital.            [facility] is not responsible for any belongings or valuables.  Do NOT Smoke (Tobacco/Vaping) or drink Alcohol 24 hours prior to your procedure.  If you use a CPAP at night, you may bring all equipment for your overnight stay.   Contacts, glasses, dentures or bridgework may not be worn into surgery.      For patients admitted to the hospital, discharge time will be determined by your treatment team.   Patients discharged the day of surgery will not be allowed to drive home, and someone needs to stay with them for 24  hours.    Special instructions:   [place]- Preparing For Surgery  Before surgery, you can play an important role. Because skin is not sterile, your skin needs to be as free of germs as possible. You can reduce the number of germs on your skin by washing with CHG (chlorahexidine gluconate) Soap before surgery.  CHG is an antiseptic cleaner which kills germs and bonds with the skin to continue killing germs even after washing.    Oral Hygiene is also important to reduce your risk of infection.  Remember - BRUSH YOUR TEETH THE MORNING OF SURGERY WITH YOUR REGULAR TOOTHPASTE  Please do not use if you have an allergy to CHG or antibacterial soaps. If your skin becomes reddened/irritated stop using the CHG.  Do not shave (including legs and underarms) for at least 48 hours prior to first CHG shower. It is OK to shave your face.  Please follow these instructions carefully.   1. Shower the NIGHT BEFORE SURGERY and the MORNING OF SURGERY with CHG Soap.   2. If you chose to wash your hair, wash your hair first as usual with your normal shampoo.  3. After you shampoo, rinse your hair and body thoroughly to remove the shampoo.  4. Use CHG as you would any other liquid soap. You can apply CHG directly to the skin and wash gently with a scrungie or a clean washcloth.   5. Apply the CHG Soap to your body ONLY FROM THE NECK DOWN.  Do not use on open wounds or open sores. Avoid contact with your eyes, ears, mouth and genitals (private parts). Wash Face and genitals (private parts)  with your normal soap.   6. Wash thoroughly, paying special attention to the area where your surgery will be performed.  7. Thoroughly rinse your body with warm water from the neck down.  8. DO NOT shower/wash with your normal soap after using and rinsing off the CHG Soap.  9. Pat yourself dry with a CLEAN TOWEL.  10. Wear CLEAN PAJAMAS to bed the night before surgery  11. Place CLEAN SHEETS on your bed the night of  your first shower and DO NOT SLEEP WITH PETS.   Day of Surgery: SHOWER Wear Clean/Comfortable clothing the morning of surgery Do not apply any deodorants/lotions.   Remember to brush your teeth WITH YOUR REGULAR TOOTHPASTE.   Please read over the following fact sheets that you were given.

## 2020-01-10 ENCOUNTER — Other Ambulatory Visit: Payer: Self-pay

## 2020-01-10 ENCOUNTER — Other Ambulatory Visit (HOSPITAL_COMMUNITY)
Admission: RE | Admit: 2020-01-10 | Discharge: 2020-01-10 | Disposition: A | Discharge status: Home / Self Care | Payer: Medicare HMO | Source: Ambulatory Visit | Attending: Neurosurgery | Admitting: Neurosurgery

## 2020-01-10 ENCOUNTER — Encounter (HOSPITAL_COMMUNITY): Payer: Self-pay

## 2020-01-10 ENCOUNTER — Encounter: Payer: Self-pay | Admitting: Family Medicine

## 2020-01-10 ENCOUNTER — Encounter (HOSPITAL_COMMUNITY)
Admission: RE | Admit: 2020-01-10 | Discharge: 2020-01-10 | Disposition: A | Discharge status: Home / Self Care | Payer: Medicare HMO | Source: Ambulatory Visit | Attending: Neurosurgery | Admitting: Neurosurgery

## 2020-01-10 DIAGNOSIS — E119 Type 2 diabetes mellitus without complications: Secondary | ICD-10-CM | POA: Diagnosis not present

## 2020-01-10 DIAGNOSIS — Z01812 Encounter for preprocedural laboratory examination: Secondary | ICD-10-CM | POA: Insufficient documentation

## 2020-01-10 DIAGNOSIS — Z20822 Contact with and (suspected) exposure to covid-19: Secondary | ICD-10-CM | POA: Insufficient documentation

## 2020-01-10 DIAGNOSIS — M549 Dorsalgia, unspecified: Secondary | ICD-10-CM | POA: Insufficient documentation

## 2020-01-10 HISTORY — DX: Other specified disorders of adrenal gland: E27.8

## 2020-01-10 HISTORY — DX: Pneumonia, unspecified organism: J18.9

## 2020-01-10 HISTORY — DX: Other specified postprocedural states: Z98.890

## 2020-01-10 HISTORY — DX: Gastro-esophageal reflux disease without esophagitis: K21.9

## 2020-01-10 HISTORY — DX: Cardiac murmur, unspecified: R01.1

## 2020-01-10 HISTORY — DX: Nausea with vomiting, unspecified: R11.2

## 2020-01-10 HISTORY — DX: Tinnitus, left ear: H93.12

## 2020-01-10 HISTORY — DX: Myoneural disorder, unspecified: G70.9

## 2020-01-10 HISTORY — DX: Palpitations: R00.2

## 2020-01-10 LAB — CBC
HCT: 38.7 % (ref 36.0–46.0)
Hemoglobin: 13.3 g/dL (ref 12.0–15.0)
MCH: 31.9 pg (ref 26.0–34.0)
MCHC: 34.4 g/dL (ref 30.0–36.0)
MCV: 92.8 fL (ref 80.0–100.0)
Platelets: 228 10*3/uL (ref 150–400)
RBC: 4.17 MIL/uL (ref 3.87–5.11)
RDW: 11.9 % (ref 11.5–15.5)
WBC: 4.6 10*3/uL (ref 4.0–10.5)
nRBC: 0 % (ref 0.0–0.2)

## 2020-01-10 LAB — TYPE AND SCREEN
ABO/RH(D): A POS
Antibody Screen: NEGATIVE

## 2020-01-10 LAB — SARS CORONAVIRUS 2 (TAT 6-24 HRS): SARS Coronavirus 2: NEGATIVE

## 2020-01-10 LAB — BASIC METABOLIC PANEL
Anion gap: 8 (ref 5–15)
BUN: 14 mg/dL (ref 8–23)
CO2: 28 mmol/L (ref 22–32)
Calcium: 9.1 mg/dL (ref 8.9–10.3)
Chloride: 104 mmol/L (ref 98–111)
Creatinine, Ser: 0.78 mg/dL (ref 0.44–1.00)
GFR calc non Af Amer: >60 mL/min (ref >60)
Glucose, Bld: 92 mg/dL (ref 70–99)
Potassium: 3.5 mmol/L (ref 3.5–5.1)
Sodium: 140 mmol/L (ref 135–145)

## 2020-01-10 LAB — GLUCOSE, CAPILLARY: Glucose-Capillary: 194 mg/dL — ABNORMAL HIGH (ref 70–99)

## 2020-01-10 LAB — SURGICAL PCR SCREEN
MRSA, PCR: NEGATIVE
Staphylococcus aureus: NEGATIVE

## 2020-01-10 NOTE — Progress Notes (Signed)
PCP - Betty G. Martinique, MD Cardiologist - Denies  PPM/ICD - Denies  Chest x-ray - N/A EKG - 07/26/19 Stress Test - 09/29/11  -C.E ECHO - 01/14/17- Epic Cardiac Cath - Denies  Sleep Study - Denies  Fasting Blood Sugar 130s Checks Blood Sugar 1 time a week. Last A1C 7.5 12/27/19.  Blood Thinner Instructions: N/A Aspirin Instructions: N/A  ERAS Protcol - N/A PRE-SURGERY Ensure or G2- N/A  COVID TEST- 01/10/20   Anesthesia review: Yes, review ECHO & Stress test  Patient denies shortness of breath, fever, cough and chest pain at PAT appointment   All instructions explained to the patient, with a verbal understanding of the material. Patient agrees to go over the instructions while at home for a better understanding. Patient also instructed to self quarantine after being tested for COVID-19. The opportunity to ask questions was provided.

## 2020-01-11 NOTE — Progress Notes (Signed)
Anesthesia Chart Review:  Seen by cardiology in 2018 for report of DOE.  Echo showed normal LV systolic function, grade 1 diastolic dysfunction. She follows closely with her PCP Dr. Betty Martinique for management of chronic medical issues.  Last seen 12/27/2019 and per note discussed that lumbar spine surgery as recommended by neurosurgeon due to worsening back pain.  Reportedly has had 2 prior back surgeries.  DM2, last A1c 7.5 on 12/27/2019.  Preop labs reviewed, unremarkable.  EKG 07/26/2019: NSR.  LAD.  LVH.  Rate 71.  No significant change.  TTE 01/14/2017: - Left ventricle: The cavity size was normal. Systolic function was  vigorous. The estimated ejection fraction was in the range of 65%  to 70%. Wall motion was normal; there were no regional wall  motion abnormalities. Doppler parameters are consistent with  abnormal left ventricular relaxation (grade 1 diastolic  dysfunction). Doppler parameters are consistent with high  ventricular filling pressure. Mild focal basal septal  hypertrophy.  - Aortic valve: Moderately calcified annulus. Trileaflet; mildly  thickened, mildly calcified leaflets. There was no stenosis.  - Mitral valve: Mildly thickened, mildly calcified leaflets .    Wynonia Musty [facility] Short Stay Center/Anesthesiology Phone [phone] 01/11/2020 3:24 PM

## 2020-01-11 NOTE — Anesthesia Preprocedure Evaluation (Addendum)
Anesthesia Evaluation  Patient identified by MRN, date of birth, ID band Patient awake    Reviewed: Allergy & Precautions, NPO status , Patient's Chart, lab work & pertinent test results  History of Anesthesia Complications (+) PONV and history of anesthetic complications  Airway Mallampati: II  TM Distance: >3 FB Neck ROM: Full    Dental  (+) Teeth Intact, Chipped,    Pulmonary neg pulmonary ROS,    Pulmonary exam normal breath sounds clear to auscultation       Cardiovascular hypertension, Pt. on medications + DOE  Normal cardiovascular exam Rhythm:Regular Rate:Normal     Neuro/Psych PSYCHIATRIC DISORDERS Anxiety Depression  Neuromuscular disease    GI/Hepatic Neg liver ROS, GERD  Medicated,  Endo/Other  diabetes, Type 2, Oral Hypoglycemic Agents  Renal/GU negative Renal ROS     Musculoskeletal  (+) Arthritis ,   Abdominal   Peds  Hematology negative hematology ROS (+)   Anesthesia Other Findings   Reproductive/Obstetrics                           Anesthesia Physical Anesthesia Plan  ASA: II  Anesthesia Plan: General   Post-op Pain Management:    Induction: Intravenous  PONV Risk Score and Plan: 4 or greater and Dexamethasone, Ondansetron, Midazolam and Propofol infusion  Airway Management Planned: Oral ETT  Additional Equipment:   Intra-op Plan:   Post-operative Plan: Extubation in OR  Informed Consent: I have reviewed the patients History and Physical, chart, labs and discussed the procedure including the risks, benefits and alternatives for the proposed anesthesia with the patient or authorized representative who has indicated his/her understanding and acceptance.       Plan Discussed with: CRNA  Anesthesia Plan Comments: (2nd PIV after induction.  PAT note by Karoline Caldwell, PA-C: Seen by cardiology in 2018 for report of DOE.  Echo showed normal LV systolic  function, grade 1 diastolic dysfunction. She follows closely with her PCP Dr. Betty Martinique for management of chronic medical issues.  Last seen 12/27/2019 and per note discussed that lumbar spine surgery as recommended by neurosurgeon due to worsening back pain.  Reportedly has had 2 prior back surgeries.  DM2, last A1c 7.5 on 12/27/2019.  Preop labs reviewed, unremarkable.  EKG 07/26/2019: NSR.  LAD.  LVH.  Rate 71.  No significant change.  TTE 01/14/2017: - Left ventricle: The cavity size was normal. Systolic function was  vigorous. The estimated ejection fraction was in the range of 65%  to 70%. Wall motion was normal; there were no regional wall  motion abnormalities. Doppler parameters are consistent with  abnormal left ventricular relaxation (grade 1 diastolic  dysfunction). Doppler parameters are consistent with high  ventricular filling pressure. Mild focal basal septal  hypertrophy.  - Aortic valve: Moderately calcified annulus. Trileaflet; mildly  thickened, mildly calcified leaflets. There was no stenosis.  - Mitral valve: Mildly thickened, mildly calcified leaflets .   )      Anesthesia Quick Evaluation

## 2020-01-13 ENCOUNTER — Ambulatory Visit (HOSPITAL_COMMUNITY): Payer: Medicare HMO | Admitting: Physician Assistant

## 2020-01-13 ENCOUNTER — Encounter (HOSPITAL_COMMUNITY)
Admission: RE | Disposition: A | Discharge status: Home / Self Care | Payer: Self-pay | Source: Home / Self Care | Attending: Neurosurgery

## 2020-01-13 ENCOUNTER — Encounter (HOSPITAL_COMMUNITY): Payer: Self-pay | Admitting: Neurosurgery

## 2020-01-13 ENCOUNTER — Ambulatory Visit (HOSPITAL_COMMUNITY): Payer: Medicare HMO | Admitting: Anesthesiology

## 2020-01-13 ENCOUNTER — Other Ambulatory Visit: Payer: Self-pay

## 2020-01-13 ENCOUNTER — Ambulatory Visit (HOSPITAL_COMMUNITY): Payer: Medicare HMO

## 2020-01-13 ENCOUNTER — Observation Stay (HOSPITAL_COMMUNITY)
Admission: RE | Admit: 2020-01-13 | Discharge: 2020-01-16 | Disposition: A | Discharge status: Home / Self Care | Payer: Medicare HMO | Attending: Neurosurgery | Admitting: Neurosurgery

## 2020-01-13 DIAGNOSIS — I10 Essential (primary) hypertension: Secondary | ICD-10-CM | POA: Diagnosis not present

## 2020-01-13 DIAGNOSIS — M5116 Intervertebral disc disorders with radiculopathy, lumbar region: Secondary | ICD-10-CM | POA: Diagnosis not present

## 2020-01-13 DIAGNOSIS — Z7984 Long term (current) use of oral hypoglycemic drugs: Secondary | ICD-10-CM | POA: Diagnosis not present

## 2020-01-13 DIAGNOSIS — Z79899 Other long term (current) drug therapy: Secondary | ICD-10-CM | POA: Insufficient documentation

## 2020-01-13 DIAGNOSIS — M48061 Spinal stenosis, lumbar region without neurogenic claudication: Principal | ICD-10-CM | POA: Insufficient documentation

## 2020-01-13 DIAGNOSIS — M4316 Spondylolisthesis, lumbar region: Secondary | ICD-10-CM | POA: Insufficient documentation

## 2020-01-13 DIAGNOSIS — Z419 Encounter for procedure for purposes other than remedying health state, unspecified: Secondary | ICD-10-CM

## 2020-01-13 DIAGNOSIS — M5126 Other intervertebral disc displacement, lumbar region: Secondary | ICD-10-CM | POA: Diagnosis not present

## 2020-01-13 DIAGNOSIS — E119 Type 2 diabetes mellitus without complications: Secondary | ICD-10-CM | POA: Diagnosis not present

## 2020-01-13 DIAGNOSIS — M5136 Other intervertebral disc degeneration, lumbar region: Secondary | ICD-10-CM | POA: Diagnosis not present

## 2020-01-13 LAB — GLUCOSE, CAPILLARY
Glucose-Capillary: 170 mg/dL — ABNORMAL HIGH (ref 70–99)
Glucose-Capillary: 164 mg/dL — ABNORMAL HIGH (ref 70–99)
Glucose-Capillary: 237 mg/dL — ABNORMAL HIGH (ref 70–99)
Glucose-Capillary: 221 mg/dL — ABNORMAL HIGH (ref 70–99)

## 2020-01-13 LAB — ABO/RH: ABO/RH(D): A POS

## 2020-01-13 SURGERY — POSTERIOR LUMBAR FUSION 1 LEVEL
Anesthesia: General | Site: Back

## 2020-01-13 MED ORDER — GABAPENTIN 400 MG PO CAPS
800.0000 mg | ORAL_CAPSULE | Freq: Every day | ORAL | Status: DC
  Administered 2020-01-13 – 2020-01-15 (×3): 800 mg via ORAL
  Filled 2020-01-13 (×3): qty 2

## 2020-01-13 MED ORDER — ONDANSETRON HCL 4 MG/2ML IJ SOLN: 4.0000 mg | Freq: Four times a day (QID) | INTRAMUSCULAR | Status: DC | PRN

## 2020-01-13 MED ORDER — FENTANYL CITRATE (PF) 100 MCG/2ML IJ SOLN
25.0000 ug | INTRAMUSCULAR | Status: DC | PRN
  Administered 2020-01-13: 25 ug via INTRAVENOUS
  Administered 2020-01-13: 50 ug via INTRAVENOUS
  Administered 2020-01-13 (×2): 25 ug via INTRAVENOUS

## 2020-01-13 MED ORDER — QUETIAPINE FUMARATE 50 MG PO TABS
50.0000 mg | ORAL_TABLET | Freq: Every day | ORAL | Status: DC
  Administered 2020-01-13: 50 mg via ORAL
  Filled 2020-01-13 (×4): qty 1

## 2020-01-13 MED ORDER — FENTANYL CITRATE (PF) 100 MCG/2ML IJ SOLN
INTRAMUSCULAR | Status: AC
  Filled 2020-01-13: qty 2

## 2020-01-13 MED ORDER — ONDANSETRON HCL 4 MG/2ML IJ SOLN
INTRAMUSCULAR | Status: AC
  Filled 2020-01-13: qty 2

## 2020-01-13 MED ORDER — MIDAZOLAM HCL 5 MG/5ML IJ SOLN
INTRAMUSCULAR | Status: DC | PRN
  Administered 2020-01-13: 2 mg via INTRAVENOUS

## 2020-01-13 MED ORDER — VANCOMYCIN HCL IN DEXTROSE 1-5 GM/200ML-% IV SOLN: 1000.0000 mg | Freq: Once | INTRAVENOUS | Status: DC

## 2020-01-13 MED ORDER — SODIUM CHLORIDE 0.9% FLUSH: 3.0000 mL | INTRAVENOUS | Status: DC | PRN

## 2020-01-13 MED ORDER — BUPIVACAINE HCL (PF) 0.25 % IJ SOLN
INTRAMUSCULAR | Status: AC
  Filled 2020-01-13: qty 30

## 2020-01-13 MED ORDER — INSULIN ASPART 100 UNIT/ML ~~LOC~~ SOLN: 0.0000 [IU] | Freq: Three times a day (TID) | SUBCUTANEOUS | Status: DC

## 2020-01-13 MED ORDER — ACETAMINOPHEN 500 MG PO TABS
1000.0000 mg | ORAL_TABLET | Freq: Once | ORAL | Status: AC
  Administered 2020-01-13: 1000 mg via ORAL
  Filled 2020-01-13: qty 2

## 2020-01-13 MED ORDER — ROCURONIUM BROMIDE 10 MG/ML (PF) SYRINGE
PREFILLED_SYRINGE | INTRAVENOUS | Status: DC | PRN
  Administered 2020-01-13: 40 mg via INTRAVENOUS
  Administered 2020-01-13: 60 mg via INTRAVENOUS

## 2020-01-13 MED ORDER — ONDANSETRON HCL 4 MG PO TABS: 4.0000 mg | ORAL_TABLET | Freq: Four times a day (QID) | ORAL | Status: DC | PRN

## 2020-01-13 MED ORDER — CHLORHEXIDINE GLUCONATE CLOTH 2 % EX PADS: 6.0000 | MEDICATED_PAD | Freq: Once | CUTANEOUS | Status: DC

## 2020-01-13 MED ORDER — LIDOCAINE-EPINEPHRINE 1 %-1:100000 IJ SOLN
INTRAMUSCULAR | Status: AC
  Filled 2020-01-13: qty 1

## 2020-01-13 MED ORDER — INSULIN ASPART 100 UNIT/ML ~~LOC~~ SOLN
0.0000 [IU] | Freq: Three times a day (TID) | SUBCUTANEOUS | Status: DC
  Administered 2020-01-13: 5 [IU] via SUBCUTANEOUS
  Administered 2020-01-14: 8 [IU] via SUBCUTANEOUS
  Administered 2020-01-14: 5 [IU] via SUBCUTANEOUS
  Administered 2020-01-14: 8 [IU] via SUBCUTANEOUS
  Administered 2020-01-15 (×2): 5 [IU] via SUBCUTANEOUS
  Administered 2020-01-15 – 2020-01-16 (×3): 3 [IU] via SUBCUTANEOUS

## 2020-01-13 MED ORDER — CYCLOBENZAPRINE HCL 10 MG PO TABS
ORAL_TABLET | ORAL | Status: AC
  Filled 2020-01-13: qty 1

## 2020-01-13 MED ORDER — TRIAMCINOLONE ACETONIDE 0.1 % MT PSTE: 1.0000 "application " | PASTE | Freq: Two times a day (BID) | OROMUCOSAL | Status: DC | PRN

## 2020-01-13 MED ORDER — ALBUTEROL SULFATE HFA 108 (90 BASE) MCG/ACT IN AERS
2.0000 | INHALATION_SPRAY | Freq: Four times a day (QID) | RESPIRATORY_TRACT | Status: DC
  Administered 2020-01-13 – 2020-01-14 (×2): 2 via RESPIRATORY_TRACT
  Filled 2020-01-13: qty 6.7

## 2020-01-13 MED ORDER — MIDAZOLAM HCL 2 MG/2ML IJ SOLN
INTRAMUSCULAR | Status: AC
  Filled 2020-01-13: qty 2

## 2020-01-13 MED ORDER — ONDANSETRON HCL 4 MG/2ML IJ SOLN
INTRAMUSCULAR | Status: DC | PRN
  Administered 2020-01-13 (×2): 4 mg via INTRAVENOUS

## 2020-01-13 MED ORDER — CHLORHEXIDINE GLUCONATE 0.12 % MT SOLN
15.0000 mL | Freq: Once | OROMUCOSAL | Status: AC
  Administered 2020-01-13: 15 mL via OROMUCOSAL
  Filled 2020-01-13: qty 15

## 2020-01-13 MED ORDER — LACTATED RINGERS IV SOLN: INTRAVENOUS | Status: DC

## 2020-01-13 MED ORDER — SUGAMMADEX SODIUM 200 MG/2ML IV SOLN
INTRAVENOUS | Status: DC | PRN
  Administered 2020-01-13: 200 mg via INTRAVENOUS

## 2020-01-13 MED ORDER — PANTOPRAZOLE SODIUM 40 MG IV SOLR: 40.0000 mg | Freq: Every day | INTRAVENOUS | Status: DC

## 2020-01-13 MED ORDER — HYDROCHLOROTHIAZIDE 25 MG PO TABS
25.0000 mg | ORAL_TABLET | Freq: Every day | ORAL | Status: DC
  Administered 2020-01-13 – 2020-01-16 (×3): 25 mg via ORAL
  Filled 2020-01-13 (×4): qty 1

## 2020-01-13 MED ORDER — DEXAMETHASONE SODIUM PHOSPHATE 10 MG/ML IJ SOLN
INTRAMUSCULAR | Status: AC
  Filled 2020-01-13: qty 1

## 2020-01-13 MED ORDER — LIDOCAINE-EPINEPHRINE 1 %-1:100000 IJ SOLN
INTRAMUSCULAR | Status: DC | PRN
  Administered 2020-01-13: 6 mL

## 2020-01-13 MED ORDER — PROPOFOL 10 MG/ML IV BOLUS
INTRAVENOUS | Status: DC | PRN
  Administered 2020-01-13: 160 mg via INTRAVENOUS

## 2020-01-13 MED ORDER — OXYCODONE HCL 5 MG PO TABS
10.0000 mg | ORAL_TABLET | ORAL | Status: DC | PRN
  Administered 2020-01-13 – 2020-01-15 (×7): 10 mg via ORAL
  Filled 2020-01-13 (×7): qty 2

## 2020-01-13 MED ORDER — INSULIN ASPART 100 UNIT/ML ~~LOC~~ SOLN
0.0000 [IU] | Freq: Every day | SUBCUTANEOUS | Status: DC
  Administered 2020-01-13: 5 [IU] via SUBCUTANEOUS
  Administered 2020-01-14: 3 [IU] via SUBCUTANEOUS
  Administered 2020-01-15: 2 [IU] via SUBCUTANEOUS

## 2020-01-13 MED ORDER — ACETAMINOPHEN 325 MG PO TABS
650.0000 mg | ORAL_TABLET | ORAL | Status: DC | PRN
  Administered 2020-01-14 (×2): 650 mg via ORAL
  Filled 2020-01-13 (×2): qty 2

## 2020-01-13 MED ORDER — OLOPATADINE HCL 0.7 % OP SOLN: 1.0000 [drp] | Freq: Every day | OPHTHALMIC | Status: DC | PRN

## 2020-01-13 MED ORDER — HYDROCODONE-ACETAMINOPHEN 7.5-325 MG PO TABS: 1.0000 | ORAL_TABLET | Freq: Four times a day (QID) | ORAL | Status: DC | PRN

## 2020-01-13 MED ORDER — 0.9 % SODIUM CHLORIDE (POUR BTL) OPTIME
TOPICAL | Status: DC | PRN
  Administered 2020-01-13 (×2): 1000 mL

## 2020-01-13 MED ORDER — POTASSIUM CHLORIDE CRYS ER 10 MEQ PO TBCR
10.0000 meq | EXTENDED_RELEASE_TABLET | Freq: Every day | ORAL | Status: DC
  Administered 2020-01-13 – 2020-01-16 (×4): 10 meq via ORAL
  Filled 2020-01-13 (×6): qty 1

## 2020-01-13 MED ORDER — GLIPIZIDE 5 MG PO TABS
5.0000 mg | ORAL_TABLET | Freq: Every day | ORAL | Status: DC
  Administered 2020-01-14 – 2020-01-16 (×3): 5 mg via ORAL
  Filled 2020-01-13 (×3): qty 1

## 2020-01-13 MED ORDER — BUPIVACAINE LIPOSOME 1.3 % IJ SUSP
20.0000 mL | Freq: Once | INTRAMUSCULAR | Status: AC
  Administered 2020-01-13: 20 mL
  Filled 2020-01-13: qty 20

## 2020-01-13 MED ORDER — PROPOFOL 500 MG/50ML IV EMUL
INTRAVENOUS | Status: AC
  Filled 2020-01-13: qty 50

## 2020-01-13 MED ORDER — PROPOFOL 500 MG/50ML IV EMUL
INTRAVENOUS | Status: DC | PRN
  Administered 2020-01-13: 25 ug/kg/min via INTRAVENOUS

## 2020-01-13 MED ORDER — ALUM & MAG HYDROXIDE-SIMETH 200-200-20 MG/5ML PO SUSP: 30.0000 mL | Freq: Four times a day (QID) | ORAL | Status: DC | PRN

## 2020-01-13 MED ORDER — IPRATROPIUM BROMIDE 0.06 % NA SOLN: 2.0000 | Freq: Four times a day (QID) | NASAL | Status: DC | PRN

## 2020-01-13 MED ORDER — PROPOFOL 10 MG/ML IV BOLUS
INTRAVENOUS | Status: AC
  Filled 2020-01-13: qty 20

## 2020-01-13 MED ORDER — DEXAMETHASONE SODIUM PHOSPHATE 10 MG/ML IJ SOLN
10.0000 mg | Freq: Once | INTRAMUSCULAR | Status: AC
  Administered 2020-01-13: 10 mg via INTRAVENOUS
  Filled 2020-01-13: qty 1

## 2020-01-13 MED ORDER — THROMBIN 20000 UNITS EX SOLR
CUTANEOUS | Status: AC
  Filled 2020-01-13: qty 20000

## 2020-01-13 MED ORDER — SODIUM CHLORIDE 0.9 % IV SOLN: 250.0000 mL | INTRAVENOUS | Status: DC

## 2020-01-13 MED ORDER — LIDOCAINE 2% (20 MG/ML) 5 ML SYRINGE
INTRAMUSCULAR | Status: DC | PRN
  Administered 2020-01-13: 80 mg via INTRAVENOUS

## 2020-01-13 MED ORDER — ACETAMINOPHEN 650 MG RE SUPP: 650.0000 mg | RECTAL | Status: DC | PRN

## 2020-01-13 MED ORDER — MENTHOL 3 MG MT LOZG: 1.0000 | LOZENGE | OROMUCOSAL | Status: DC | PRN

## 2020-01-13 MED ORDER — HYDROMORPHONE HCL 1 MG/ML IJ SOLN
0.5000 mg | INTRAMUSCULAR | Status: DC | PRN
  Administered 2020-01-13 – 2020-01-15 (×3): 0.5 mg via INTRAVENOUS
  Filled 2020-01-13: qty 0.5
  Filled 2020-01-13 (×2): qty 1

## 2020-01-13 MED ORDER — FENTANYL CITRATE (PF) 250 MCG/5ML IJ SOLN
INTRAMUSCULAR | Status: DC | PRN
Start: 2020-01-13 — End: 2020-01-13
  Administered 2020-01-13: 50 ug via INTRAVENOUS
  Administered 2020-01-13: 100 ug via INTRAVENOUS
  Administered 2020-01-13 (×2): 50 ug via INTRAVENOUS

## 2020-01-13 MED ORDER — ORAL CARE MOUTH RINSE: 15.0000 mL | Freq: Once | OROMUCOSAL | Status: AC

## 2020-01-13 MED ORDER — THROMBIN 20000 UNITS EX SOLR
CUTANEOUS | Status: DC | PRN
  Administered 2020-01-13: 20 mL via TOPICAL

## 2020-01-13 MED ORDER — THROMBIN 5000 UNITS EX SOLR
CUTANEOUS | Status: AC
  Filled 2020-01-13: qty 5000

## 2020-01-13 MED ORDER — CYCLOBENZAPRINE HCL 10 MG PO TABS
10.0000 mg | ORAL_TABLET | Freq: Three times a day (TID) | ORAL | Status: DC | PRN
  Administered 2020-01-13 – 2020-01-14 (×3): 10 mg via ORAL
  Filled 2020-01-13 (×4): qty 1

## 2020-01-13 MED ORDER — CLONAZEPAM 1 MG PO TABS
1.0000 mg | ORAL_TABLET | Freq: Four times a day (QID) | ORAL | Status: DC | PRN
  Administered 2020-01-13 – 2020-01-15 (×5): 1 mg via ORAL
  Filled 2020-01-13 (×3): qty 1
  Filled 2020-01-13 (×3): qty 2

## 2020-01-13 MED ORDER — VANCOMYCIN HCL IN DEXTROSE 1-5 GM/200ML-% IV SOLN
1000.0000 mg | INTRAVENOUS | Status: AC
  Administered 2020-01-13: 1000 mg via INTRAVENOUS
  Filled 2020-01-13: qty 200

## 2020-01-13 MED ORDER — PROMETHAZINE HCL 25 MG/ML IJ SOLN: 6.2500 mg | INTRAMUSCULAR | Status: DC | PRN

## 2020-01-13 MED ORDER — PHENYLEPHRINE HCL-NACL 10-0.9 MG/250ML-% IV SOLN
INTRAVENOUS | Status: DC | PRN
  Administered 2020-01-13: 25 ug/min via INTRAVENOUS

## 2020-01-13 MED ORDER — DULAGLUTIDE 1.5 MG/0.5ML ~~LOC~~ SOAJ: 1.5000 mg | SUBCUTANEOUS | Status: DC

## 2020-01-13 MED ORDER — FENTANYL CITRATE (PF) 250 MCG/5ML IJ SOLN
INTRAMUSCULAR | Status: AC
  Filled 2020-01-13: qty 5

## 2020-01-13 MED ORDER — PHENOL 1.4 % MT LIQD
1.0000 | OROMUCOSAL | Status: DC | PRN
  Filled 2020-01-13: qty 177

## 2020-01-13 MED ORDER — SODIUM CHLORIDE 0.9% FLUSH
3.0000 mL | Freq: Two times a day (BID) | INTRAVENOUS | Status: DC
  Administered 2020-01-14 – 2020-01-16 (×5): 3 mL via INTRAVENOUS

## 2020-01-13 MED ORDER — DEXAMETHASONE SODIUM PHOSPHATE 4 MG/ML IJ SOLN
4.0000 mg | Freq: Four times a day (QID) | INTRAMUSCULAR | Status: DC
  Administered 2020-01-13 – 2020-01-16 (×12): 4 mg via INTRAVENOUS
  Filled 2020-01-13 (×14): qty 1

## 2020-01-13 MED ORDER — PANTOPRAZOLE SODIUM 40 MG PO TBEC
40.0000 mg | DELAYED_RELEASE_TABLET | Freq: Every day | ORAL | Status: DC
  Administered 2020-01-14 – 2020-01-16 (×3): 40 mg via ORAL
  Filled 2020-01-13 (×3): qty 1

## 2020-01-13 SURGICAL SUPPLY — 81 items
ADH SKN CLS APL DERMABOND .7 (GAUZE/BANDAGES/DRESSINGS) ×1
APL SKNCLS STERI-STRIP NONHPOA (GAUZE/BANDAGES/DRESSINGS) ×1
BAG DECANTER FOR FLEXI CONT (MISCELLANEOUS) ×3 IMPLANT
BENZOIN TINCTURE PRP APPL 2/3 (GAUZE/BANDAGES/DRESSINGS) ×3 IMPLANT
BLADE CLIPPER SURG (BLADE) IMPLANT
BLADE SURG 11 STRL SS (BLADE) ×3 IMPLANT
BONE VIVIGEN FORMABLE 5.4CC (Bone Implant) ×3 IMPLANT
BUR CUTTER 7.0 ROUND (BURR) ×3 IMPLANT
BUR MATCHSTICK NEURO 3.0 LAGG (BURR) ×3 IMPLANT
CANISTER SUCT 3000ML PPV (MISCELLANEOUS) ×3 IMPLANT
CAP LOCKING THREADED (Cap) ×8 IMPLANT
CARTRIDGE OIL MAESTRO DRILL (MISCELLANEOUS) ×1 IMPLANT
CLOSURE WOUND 1/2 X4 (GAUZE/BANDAGES/DRESSINGS) ×1
CNTNR URN SCR LID CUP LEK RST (MISCELLANEOUS) ×1 IMPLANT
CONT SPEC 4OZ STRL OR WHT (MISCELLANEOUS) ×3
COVER BACK TABLE 60X90IN (DRAPES) ×3 IMPLANT
COVER WAND RF STERILE (DRAPES) ×3 IMPLANT
DECANTER SPIKE VIAL GLASS SM (MISCELLANEOUS) ×3 IMPLANT
DERMABOND ADVANCED (GAUZE/BANDAGES/DRESSINGS) ×2
DERMABOND ADVANCED .7 DNX12 (GAUZE/BANDAGES/DRESSINGS) ×1 IMPLANT
DIFFUSER DRILL AIR PNEUMATIC (MISCELLANEOUS) ×3 IMPLANT
DRAPE C-ARM 42X72 X-RAY (DRAPES) ×6 IMPLANT
DRAPE C-ARMOR (DRAPES) IMPLANT
DRAPE HALF SHEET 40X57 (DRAPES) IMPLANT
DRAPE LAPAROTOMY 100X72X124 (DRAPES) ×3 IMPLANT
DRAPE SURG 17X23 STRL (DRAPES) ×3 IMPLANT
DRSG OPSITE 4X5.5 SM (GAUZE/BANDAGES/DRESSINGS) ×3 IMPLANT
DRSG OPSITE POSTOP 4X6 (GAUZE/BANDAGES/DRESSINGS) ×3 IMPLANT
DURAPREP 26ML APPLICATOR (WOUND CARE) ×3 IMPLANT
ELECT REM PT RETURN 9FT ADLT (ELECTROSURGICAL) ×3
ELECTRODE REM PT RTRN 9FT ADLT (ELECTROSURGICAL) ×1 IMPLANT
EVACUATOR 3/16  PVC DRAIN (DRAIN)
EVACUATOR 3/16 PVC DRAIN (DRAIN) IMPLANT
GAUZE 4X4 16PLY RFD (DISPOSABLE) IMPLANT
GAUZE SPONGE 4X4 12PLY STRL (GAUZE/BANDAGES/DRESSINGS) ×3 IMPLANT
GLOVE BIO SURGEON STRL SZ 6.5 (GLOVE) ×1 IMPLANT
GLOVE BIO SURGEON STRL SZ7 (GLOVE) ×2 IMPLANT
GLOVE BIO SURGEON STRL SZ8 (GLOVE) ×6 IMPLANT
GLOVE BIO SURGEONS STRL SZ 6.5 (GLOVE) ×1
GLOVE BIOGEL PI IND STRL 6.5 (GLOVE) IMPLANT
GLOVE BIOGEL PI IND STRL 7.0 (GLOVE) IMPLANT
GLOVE BIOGEL PI IND STRL 7.5 (GLOVE) IMPLANT
GLOVE BIOGEL PI INDICATOR 6.5 (GLOVE) ×8
GLOVE BIOGEL PI INDICATOR 7.0 (GLOVE) ×2
GLOVE BIOGEL PI INDICATOR 7.5 (GLOVE) ×4
GLOVE EXAM NITRILE XL STR (GLOVE) IMPLANT
GLOVE INDICATOR 8.5 STRL (GLOVE) ×6 IMPLANT
GOWN STRL REUS W/ TWL LRG LVL3 (GOWN DISPOSABLE) IMPLANT
GOWN STRL REUS W/ TWL XL LVL3 (GOWN DISPOSABLE) ×2 IMPLANT
GOWN STRL REUS W/TWL 2XL LVL3 (GOWN DISPOSABLE) IMPLANT
GOWN STRL REUS W/TWL LRG LVL3 (GOWN DISPOSABLE) ×9
GOWN STRL REUS W/TWL XL LVL3 (GOWN DISPOSABLE) ×6
GRAFT BNE MATRIX VG FRMBL MD 5 (Bone Implant) IMPLANT
HEMOSTAT POWDER KIT SURGIFOAM (HEMOSTASIS) IMPLANT
KIT BASIN OR (CUSTOM PROCEDURE TRAY) ×3 IMPLANT
KIT TURNOVER KIT B (KITS) ×3 IMPLANT
MILL MEDIUM DISP (BLADE) ×3 IMPLANT
NDL HYPO 21X1.5 SAFETY (NEEDLE) IMPLANT
NDL HYPO 25X1 1.5 SAFETY (NEEDLE) ×1 IMPLANT
NEEDLE HYPO 21X1.5 SAFETY (NEEDLE) ×3 IMPLANT
NEEDLE HYPO 25X1 1.5 SAFETY (NEEDLE) ×3 IMPLANT
NS IRRIG 1000ML POUR BTL (IV SOLUTION) ×5 IMPLANT
OIL CARTRIDGE MAESTRO DRILL (MISCELLANEOUS) ×3
PACK LAMINECTOMY NEURO (CUSTOM PROCEDURE TRAY) ×3 IMPLANT
PAD ARMBOARD 7.5X6 YLW CONV (MISCELLANEOUS) ×3 IMPLANT
ROD 40MM SPINAL (Rod) ×4 IMPLANT
SCREW PA THRD CREO TULIP 5.5X4 (Head) ×12 IMPLANT
SHAFT CREO 30MM (Neuro Prosthesis/Implant) ×8 IMPLANT
SPACER SUSTAIN RT TI 8X22X11 8 (Spacer) ×6 IMPLANT
SPONGE LAP 4X18 RFD (DISPOSABLE) IMPLANT
SPONGE SURGIFOAM ABS GEL 100 (HEMOSTASIS) ×3 IMPLANT
STRIP CLOSURE SKIN 1/2X4 (GAUZE/BANDAGES/DRESSINGS) ×3 IMPLANT
SUT VIC AB 0 CT1 18XCR BRD8 (SUTURE) ×2 IMPLANT
SUT VIC AB 0 CT1 8-18 (SUTURE) ×3
SUT VIC AB 2-0 CT1 18 (SUTURE) ×3 IMPLANT
SUT VIC AB 4-0 PS2 27 (SUTURE) ×3 IMPLANT
SYR 20ML LL LF (SYRINGE) ×2 IMPLANT
TOWEL GREEN STERILE (TOWEL DISPOSABLE) ×3 IMPLANT
TOWEL GREEN STERILE FF (TOWEL DISPOSABLE) ×3 IMPLANT
TRAY FOLEY MTR SLVR 16FR STAT (SET/KITS/TRAYS/PACK) ×3 IMPLANT
WATER STERILE IRR 1000ML POUR (IV SOLUTION) ×3 IMPLANT

## 2020-01-13 NOTE — Transfer of Care (Signed)
Immediate Anesthesia Transfer of Care Note  Patient: [name]  Procedure(s) Performed: Posterior Lumbar Interbody Fusion  - Lumbar two-Lumbar three (N/A Back)  Patient Location: PACU  Anesthesia Type:General  Level of Consciousness: drowsy  Airway & Oxygen Therapy: Patient Spontanous Breathing  Post-op Assessment: Report given to RN, Post -op Vital signs reviewed and stable and Patient moving all extremities X 4  Post vital signs: Reviewed and stable  Last Vitals:  Vitals Value Taken Time  BP 153/90 01/13/20 1110  Temp 36.1 C 01/13/20 1110  Pulse 75 01/13/20 1115  Resp 15 01/13/20 1115  SpO2 98 % 01/13/20 1115  Vitals shown include unvalidated device data.  Last Pain:  Vitals:   01/13/20 0706  TempSrc:   PainSc: 10-Worst pain ever         Complications: No complications documented.

## 2020-01-13 NOTE — Anesthesia Procedure Notes (Signed)
Procedure Name: Intubation Date/Time: 01/13/2020 7:35 AM Performed by: Kyung Rudd, CRNA Pre-anesthesia Checklist: Patient identified, Emergency Drugs available, Suction available and Patient being monitored Patient Re-evaluated:Patient Re-evaluated prior to induction Oxygen Delivery Method: Circle system utilized Preoxygenation: Pre-oxygenation with 100% oxygen Induction Type: IV induction Ventilation: Mask ventilation without difficulty Laryngoscope Size: Mac and 3 Grade View: Grade III Tube type: Oral Tube size: 7.0 mm Number of attempts: 1 Airway Equipment and Method: Stylet Placement Confirmation: positive ETCO2 and breath sounds checked- equal and bilateral Secured at: 21 cm Tube secured with: Tape Dental Injury: Teeth and Oropharynx as per pre-operative assessment

## 2020-01-13 NOTE — Op Note (Signed)
Preoperative diagnosis: Lumbar spinal stenosis recurrent herniated nucleus pulposus and spondylolisthesis L2-3  Postoperative diagnosis: Same  Procedure: #1 redo decompressive lumbar laminectomy with complete medial facetectomies and radical retinotomies of the L4 and L5 nerve roots in excess and requiring more work than would be needed with a standard interbody fusion  2.  Posterior lumbar interbody fusion utilizing the globus insert and rotate titanium cages packed with locally harvested autograft mixed with vivigen  3.  Cortical screw fixation with the globus Creo modular cortical screw set  Surgeon: Dr. Dominica Severin Deundra Bard  Assistant: Nash Shearer  Anesthesia: General  EBL: Minimal  HPI: 69 year old female with history of a laminotomy performed on the right to 3 patient developed recurrent back and left leg pain and ultimately right leg pain and work-up revealed a large disc radiation on the left with a free fragment displacing the left L3 nerve against the left L3 pedicle as well as recurrent disc herniation on the right due to patient progression of clinical syndrome imaging findings and failed conservative treatment I recommended redo decompressive laminectomy and interbody fusion at that level.  I extensively went over the risks and benefits of that operation with her as well as perioperative course expectations of outcome and alternatives of surgery and she understood and agreed to proceed forward.  Operative procedure: Patient was brought into the OR was due to general anesthesia positioned prone on the Wilson frame her back was prepped and draped in routine sterile fashion her old incision was infiltrated and opened up and extended slightly cephalad and caudad.  Scar tissue was dissected free and subperiosteal dissection was carried lamina of L2 and L3 and I confirmed with intraoperative x-ray the appropriate level.  Then the spinous process of L2 was removed central decompression was begun  initially I performed complete facetectomy on the left side aggressively under biting the superior to collating facet to gain access lateral margin of disc base performed radical foraminotomies of the L2 and L3 nerve roots on the left then working through the scar tissue in the right freed all that up and redo had a decompression there with complete medial facetectomy and radical foraminotomies.  Then I immediately identified the recurrent disc herniation freed this up many of the dura and freed up the scar tissue to mobilize the thecal sac.  And then the new disc herniation on the left side had migrated inferior to the disc base and was causing severe compression of the left L3 nerve root freeing up the left L3 nerve root from its dense adhesions to the fragment I then removed several fragments of disc that were medial to the pedicle at that level and now that side.  After the discectomy then went into the disc base cleaned out the disc space prepared the endplates and with sequential distraction and 11 distractor in place I selected 8 mm wide 12 mm tall 8 degree implants packed it with locally harvested autograft mixed and inserted at I then packed an extensive mount of autograft mix centrally and conservative the contralateral cage.  Fluoroscopy was used the step along the way and I used fluoroscopy to place cortical screws cortical screws at L2 and L3 were placed in routine fashion all screws with excellent purchase.  Then assembled the heads reinspected the foramina to confirm patency and no migration of graft material then selected 40 mm rods anchored everything in place compressed the L2 screw against L3 place Gelfoam in the dura closed the wound in layers with interrupted  Vicryl in the fascia injected the fascia with Exparel.  Then closed the subcutaneous layer with interrupted Vicryl in a running 4-0 subcuticular in the skin Dermabond benzoin Steri-Strips and a sterile dressing was applied patient recovery  room in stable condition.  At the end of the case all needle counts and sponge counts were correct

## 2020-01-13 NOTE — H&P (Signed)
[name] is an 69 y.o. female.   Chief Complaint: Back bilateral hip and leg pain HPI: 69 year old female progressive worsening back and bilateral hip and leg pain work-up is revealed degenerative disc disease spondylolisthesis L2-3 at the site of her previous laminotomy on the right and discectomy at that level.  Due to the progression of clinical syndrome imaging findings and failed conservative treatment I recommended posterior lumbar interbody fusion at L2-3 I extensively gone over the risks and benefits of that operation with her as well as perioperative course expectations of outcome and alternatives of surgery and she understands and agrees to proceed forward.  Past Medical History:  Diagnosis Date  . 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 Anxiety   . Arthritis   . Diabetes mellitus without complication ([place])   . GERD (gastroesophageal reflux disease)   . Heart murmur   . Hyperlipidemia   . Hypertension   . Neuromuscular disorder ([place])   . Palpitations   . Pneumonia   . PONV (postoperative nausea and vomiting)   . PTSD (post-traumatic stress disorder)   . Ringing of ears, left     Past Surgical History:  Procedure Laterality Date  . ABDOMINAL HYSTERECTOMY     partial  . ACHILLES TENDON SURGERY Left   . APPENDECTOMY    . BACK SURGERY    . BLADDER SUSPENSION    . BREAST SURGERY Right 2003   milk duct removed  . CHOLECYSTECTOMY    . FRACTURE SURGERY     collar bone  . NOSE SURGERY     x 2   . OTHER SURGICAL HISTORY     Breast Duct removed  . PARTIAL HYSTERECTOMY    . RECTOCELE REPAIR    . TONSILLECTOMY    . VASCULAR SURGERY Right    right leg  . VEIN SURGERY Right    leg    Family History  Problem Relation Age of Onset  . Diabetes Mother   . Alzheimer's disease Mother   . Lung cancer Father   . Esophageal cancer Brother   . Colon cancer Neg Hx    Social History:  reports that she has never smoked. She has never used smokeless tobacco. She reports that she  does not drink alcohol and does not use drugs.  Allergies:  Allergies  Allergen Reactions  . Hydromorphone Nausea And Vomiting and Other (See Comments)    Made the patient feel "spaced out," also Made the patient feel "spaced out," also  . Morphine And Related Itching    With high doses  . Penicillins Rash    Has patient had a PCN reaction causing immediate rash, facial/tongue/throat swelling, SOB or lightheadedness with hypotension: Yes Has patient had a PCN reaction causing severe rash involving mucus membranes or skin necrosis: No Has patient had a PCN reaction that required hospitalization: No Has patient had a PCN reaction occurring within the last 10 years: No If all of the above answers are "NO", then may proceed with Cephalosporin use.     Medications Prior to Admission  Medication Sig Dispense Refill  . clonazePAM (KLONOPIN) 1 MG tablet TAKE 1 TABLET BY MOUTH TWICE A DAY AS NEEDED FOR ANXIETY CONTINUE WEANING OFF AS DIRECTED (Patient taking differently: Take 1 mg by mouth 4 (four) times daily as needed for anxiety. ) 60 tablet 3  . Dulaglutide (TRULICITY) 1.5 ZY/6.0YT SOPN Inject 1.5 mg into the skin once a week. (Patient taking differently: Inject 1.5 mg into the skin every  Thursday. ) 13 pen 3  . gabapentin (NEURONTIN) 400 MG capsule Take 1 capsule (400 mg total) by mouth 3 (three) times daily. (Patient taking differently: Take 800 mg by mouth at bedtime. ) 90 capsule 3  . glipiZIDE (GLUCOTROL) 5 MG tablet Take 1 tablet (5 mg total) by mouth daily before breakfast. 30 tablet 3  . hydrochlorothiazide (HYDRODIURIL) 25 MG tablet Take 1 tablet (25 mg total) by mouth daily. 90 tablet 3  . HYDROcodone-acetaminophen (NORCO) 7.5-325 MG tablet Take 1 tablet by mouth every 6 (six) hours as needed for moderate pain. 120 tablet 0  . Olopatadine HCl (PAZEO) 0.7 % SOLN Apply 1 drop to eye daily. (Patient taking differently: Apply 1 drop to eye daily as needed (allergy). ) 2.5 mL 1  .  omeprazole (PRILOSEC) 40 MG capsule Take 1 capsule (40 mg total) by mouth daily. 90 capsule 3  . potassium chloride (KLOR-CON) 10 MEQ tablet TAKE 1 TABLET BY MOUTH EVERY DAY (Patient taking differently: Take 10 mEq by mouth daily. ) 90 tablet 2  . QUEtiapine (SEROQUEL) 100 MG tablet Take 0.5 tablets (50 mg total) by mouth at bedtime. 45 tablet 1  . triamcinolone (KENALOG) 0.1 % paste Use as directed 1 application in the mouth or throat 2 (two) times daily. (Patient taking differently: Use as directed 1 application in the mouth or throat 2 (two) times daily as needed (irritation.). ) 5 g 1  . ACCU-CHEK SOFTCLIX LANCETS lancets Use to test blood sugar 3 times daily. Dx: E11.8 100 each 12  . albuterol (VENTOLIN HFA) 108 (90 Base) MCG/ACT inhaler INHALE 2 PUFFS INTO THE LUNGS EVERY 6 HOURS AS NEEDED FOR WHEEZING OR SHORTNESS OF BREATH 54 g 0  . Alcohol Swabs (B-D SINGLE USE SWABS REGULAR) PADS Use to test blood sugar 3 times daily 100 each 3  . Blood Glucose Calibration (ACCU-CHEK AVIVA) SOLN Use as directed 1 each 2  . Blood Glucose Monitoring Suppl (ACCU-CHEK AVIVA PLUS) w/Device KIT Use to check blood sugar 3 times daily 1 kit 1  . glucose blood (ACCU-CHEK AVIVA) test strip Use to test blood sugar 3 times daily. Dx: 11.8 100 each 12  . ipratropium (ATROVENT) 0.06 % nasal spray Place 2 sprays into both nostrils 4 (four) times daily. (Patient taking differently: Place 2 sprays into both nostrils 4 (four) times daily as needed (seasonal allergies.). ) 15 mL 3    Results for orders placed or performed during the hospital encounter of 01/13/20 (from the past 48 hour(s))  Glucose, capillary     Status: Abnormal   Collection Time: 01/13/20  6:04 AM  Result Value Ref Range   Glucose-Capillary 170 (H) 70 - 99 mg/dL    Comment: Glucose reference range applies only to samples taken after fasting for at least 8 hours.   No results found.  Review of Systems  Musculoskeletal: Positive for back pain.   Neurological: Positive for numbness.    Blood pressure (!) 168/71, pulse 76, temperature 98 F (36.7 C), temperature source Oral, resp. rate 18, SpO2 100 %. Physical Exam HENT:     Head: Normocephalic.     Nose: Nose normal.     Mouth/Throat:     Mouth: Mucous membranes are moist.  Eyes:     Pupils: Pupils are equal, round, and reactive to light.  Cardiovascular:     Rate and Rhythm: Normal rate.  Pulmonary:     Effort: Pulmonary effort is normal.  Abdominal:     General: Abdomen  is flat.  Musculoskeletal:        General: Normal range of motion.  Skin:    General: Skin is warm.  Neurological:     General: No focal deficit present.     Mental Status: She is alert.     Comments: Strength is 5 out of 5 iliopsoas, quads, hamstrings, gastroc, and tibialis, and EHL.      Assessment/Plan 70 year old presents for posterior lumbar interbody fusion L2-3  Elaina Hoops, MD 01/13/2020, 7:14 AM

## 2020-01-13 NOTE — Evaluation (Signed)
Occupational Therapy Evaluation Patient Details Name: [name] MRN: [id number] DOB: Oct 01, 1950 Today's Date: 01/13/2020    History of Present Illness Redo decompressive lumbar laminectomy with complete medial facetectomies and radical retinotomies of the L4 and L5 nerve roots, posterior fusion. PHMx: DM, HTN, PTSD, and anxiety   Clinical Impression   This 69 yo female admitted with above presents to acute OT with PLOF of being independent with basic ADLs and IADLs. Currently pt is min guard A for all mobility and setup-S-total A for all basic ADLs. Pt will benefit from continued acute OT without need for follow up.    Follow Up Recommendations  No OT follow up    Equipment Recommendations  3 in 1 bedside commode;Other (comment) (RW)       Precautions / Restrictions Precautions Precautions: Back Precaution Booklet Issued: Yes (comment) Precaution Comments: Can have brace off for trips to bathroom and to shower Required Braces or Orthoses: Spinal Brace Spinal Brace: Applied in sitting position Restrictions Weight Bearing Restrictions: No      Mobility Bed Mobility Overal bed mobility: Needs Assistance Bed Mobility: Sit to Sidelying         Sit to sidelying: Min guard General bed mobility comments: Pt need VCs for sequenceing (HOB flat and no rail),pt already up in bathroom when I entered room)  Transfers Overall transfer level: Needs assistance Equipment used: Rolling walker (2 wheeled) Transfers: Sit to/from Stand Sit to Stand: Min guard              Balance Overall balance assessment: Mild deficits observed, not formally tested                                         ADL either performed or assessed with clinical judgement   ADL Overall ADL's : Needs assistance/impaired Eating/Feeding: Independent;Sitting   Grooming: Wash/dry hands;Min guard;Standing Grooming Details (indicate cue type and reason): at sink Upper Body Bathing:  Set up;Sitting   Lower Body Bathing: Moderate assistance Lower Body Bathing Details (indicate cue type and reason): min A sit<>stand Upper Body Dressing : Supervision/safety;Set up;Sitting   Lower Body Dressing: Total assistance Lower Body Dressing Details (indicate cue type and reason): min A sit<>stand Toilet Transfer: Minimal assistance;Ambulation;RW;Comfort height toilet;Grab bars   Toileting- Clothing Manipulation and Hygiene: Minimal assistance;Sit to/from stand Toileting - Clothing Manipulation Details (indicate cue type and reason): VCs for hand placement (not pulling up on RW)             Vision Patient Visual Report: No change from baseline              Pertinent Vitals/Pain Pain Assessment: 0-10 Pain Score: 4  Pain Location: incisional Pain Descriptors / Indicators: Aching Pain Intervention(s): Limited activity within patient's tolerance;Monitored during session;Repositioned;Premedicated before session     Hand Dominance Right   Extremity/Trunk Assessment Upper Extremity Assessment Upper Extremity Assessment: Overall WFL for tasks assessed           Communication Communication Communication: No difficulties   Cognition Arousal/Alertness: Lethargic;Suspect due to medications (recent pain and anxiety meds) Behavior During Therapy: WFL for tasks assessed/performed Overall Cognitive Status: Within Functional Limits for tasks assessed                                 General Comments: Able to recall back precautions  Home Living Family/patient expects to be discharged to:: Private residence Living Arrangements: Spouse/significant other Available Help at Discharge: Available PRN/intermittently;Family Type of Home: House Home Access: Level entry     Home Layout: One level     Bathroom Shower/Tub: Walk-in shower;Door   Bathroom Toilet: Handicapped height     Home Equipment: Grab bars - toilet;Grab bars -  tub/shower;Hand held shower head          Prior Functioning/Environment Level of Independence: Independent                 OT Problem List: Decreased strength;Decreased range of motion;Impaired balance (sitting and/or standing);Pain      OT Treatment/Interventions: Self-care/ADL training;DME and/or AE instruction;Patient/family education;Balance training    OT Goals(Current goals can be found in the care plan section) Acute Rehab OT Goals Patient Stated Goal: home tomorrow and no pain OT Goal Formulation: With patient Time For Goal Achievement: 01/27/20 Potential to Achieve Goals: Good  OT Frequency: Min 2X/week              AM-PAC OT "6 Clicks" Daily Activity     Outcome Measure Help from another person eating meals?: None Help from another person taking care of personal grooming?: A Little Help from another person toileting, which includes using toliet, bedpan, or urinal?: A Little Help from another person bathing (including washing, rinsing, drying)?: A Lot Help from another person to put on and taking off regular upper body clothing?: A Little Help from another person to put on and taking off regular lower body clothing?: A Lot 6 Click Score: 17   End of Session Equipment Utilized During Treatment: Gait belt;Rolling walker Nurse Communication: Mobility status (pt's pain/anxiety meds kicking in--very drowsy)  Activity Tolerance: Patient tolerated treatment well Patient left: in bed;with call bell/phone within reach  OT Visit Diagnosis: Unsteadiness on feet (R26.81);Other abnormalities of gait and mobility (R26.89);Muscle weakness (generalized) (M62.81);Pain Pain - part of body:  (incisional)                Time: 0998-3382 OT Time Calculation (min): 20 min Charges:  OT General Charges $OT Visit: 1 Visit OT Evaluation $OT Eval Moderate Complexity: [place], OTR/L Acute [employer] Pager [phone] Office [phone]     Almon Register 01/13/2020, 4:44 PM

## 2020-01-14 DIAGNOSIS — M4316 Spondylolisthesis, lumbar region: Secondary | ICD-10-CM | POA: Diagnosis not present

## 2020-01-14 DIAGNOSIS — Z79899 Other long term (current) drug therapy: Secondary | ICD-10-CM | POA: Diagnosis not present

## 2020-01-14 DIAGNOSIS — M5126 Other intervertebral disc displacement, lumbar region: Secondary | ICD-10-CM | POA: Diagnosis not present

## 2020-01-14 DIAGNOSIS — E119 Type 2 diabetes mellitus without complications: Secondary | ICD-10-CM | POA: Diagnosis not present

## 2020-01-14 DIAGNOSIS — M48061 Spinal stenosis, lumbar region without neurogenic claudication: Secondary | ICD-10-CM | POA: Diagnosis not present

## 2020-01-14 DIAGNOSIS — Z7984 Long term (current) use of oral hypoglycemic drugs: Secondary | ICD-10-CM | POA: Diagnosis not present

## 2020-01-14 DIAGNOSIS — I10 Essential (primary) hypertension: Secondary | ICD-10-CM | POA: Diagnosis not present

## 2020-01-14 LAB — GLUCOSE, CAPILLARY
Glucose-Capillary: 251 mg/dL — ABNORMAL HIGH (ref 70–99)
Glucose-Capillary: 256 mg/dL — ABNORMAL HIGH (ref 70–99)
Glucose-Capillary: 206 mg/dL — ABNORMAL HIGH (ref 70–99)
Glucose-Capillary: 296 mg/dL — ABNORMAL HIGH (ref 70–99)

## 2020-01-14 NOTE — Progress Notes (Signed)
Occupational Therapy Treatment Patient Details Name: [name] MRN: [id number] DOB: 10-13-1950 Today's Date: 01/14/2020    History of present illness Redo decompressive lumbar laminectomy with complete medial facetectomies and radical retinotomies of the L4 and L5 nerve roots, posterior fusion. PHMx: DM, HTN, PTSD, and anxiety   OT comments  Pt painful and crying at times during session. Reviewed back precautions and educated on use during ADL tasks. Pt unable to complete figure four position with legs therefore will benefit from use of AE for LB ADL. Educated on toilet transfer and management of peri care. Once standing at sink, pt became "lightheaded" and pale. Assisted pt to toilet however she did not pass out. BP 135/58; HR 81; SpO2 100 RA. PT following session and asked to check BP in standing for orthostasis - most likely related to pain meds per nsg. Pt will only have assistance form friends in the evenings. Recommend update discharge plan to [facility] to maximize functional level of independence, increase adherence with back precautions with IADL tasks and reduce risk of falls. Pt in agreement with plan. Will continue to follow acutely.   Follow Up Recommendations  Home health OT;Supervision - Intermittent    Equipment Recommendations  3 in 1 bedside commode    Recommendations for Other Services      Precautions / Restrictions Precautions Precautions: Back Precaution Booklet Issued: Yes (comment) Precaution Comments: Can have brace off for trips to bathroom and to shower Required Braces or Orthoses: Spinal Brace Spinal Brace: Applied in sitting position       Mobility Bed Mobility Overal bed mobility: Needs Assistance Bed Mobility: Sidelying to Sit;Rolling Rolling: Supervision Sidelying to sit: Min guard       General bed mobility comments: VC on log rolling adn using L stronger leg to push into sidelying position; no rail up for use; pt crying and holidng her breath;  VC for relaxation and anxiety management  Transfers Overall transfer level: Needs assistance Equipment used: Rolling walker (2 wheeled) Transfers: Sit to/from [facility] Sit to Stand: Min guard Stand pivot transfers: Min guard       General transfer comment: repetitive vc to not pull up on RW; VC to stay in RW when ambulating    Balance Overall balance assessment: Mild deficits observed, not formally tested                                         ADL either performed or assessed with clinical judgement   ADL Overall ADL's : Needs assistance/impaired                         Toilet Transfer: Minimal assistance;Ambulation;Cueing for safety;Comfort height toilet;Grab bars;RW Toilet Transfer Details (indicate cue type and reason): has higher toilet at home     Tub/ Shower Transfer: Copy Details (indicate cue type and reason): Will need to use 3in1 as shouwer chair Functional mobility during ADLs: Min guard;Rolling walker;Cueing for safety General ADL Comments: Pt able to recall 3/3 back precautions; reviewed back precautions for ADL tasks. Pt unable to complete figure four position with legs therefore will nee AE to help with LB bathing and dressing. Able to reach bottom and complete pericare after toileting without use of AE. Educated on importance of avoiding twisting/bending during grooming tasks at sink level. Pt verbalized understanding.  Vision       Perception     Praxis      Cognition Arousal/Alertness: Awake/alert Behavior During Therapy: Anxious Overall Cognitive Status: Within Functional Limits for tasks assessed                                          Exercises     Shoulder Instructions       General Comments      Pertinent Vitals/ Pain       Pain Assessment: 0-10 Pain Score:  (12 when moving) Pain Location: back Pain Descriptors / Indicators:  Aching;Guarding;Grimacing;Moaning;Crying Pain Intervention(s): Limited activity within patient's tolerance;Premedicated before session;Relaxation  Home Living                                          Prior Functioning/Environment              Frequency  Min 3X/week        Progress Toward Goals  OT Goals(current goals can now be found in the care plan section)  Progress towards OT goals: Progressing toward goals  Acute Rehab OT Goals Patient Stated Goal: to start doing better OT Goal Formulation: With patient Time For Goal Achievement: 01/27/20 Potential to Achieve Goals: Good ADL Goals Pt Will Perform Grooming: with modified independence;standing Pt Will Perform Lower Body Bathing: with modified independence;sit to/from stand;with adaptive equipment Pt Will Perform Lower Body Dressing: with modified independence;with adaptive equipment;sit to/from stand Pt Will Transfer to Toilet: with modified independence;ambulating;bedside commode Pt Will Perform Toileting - Clothing Manipulation and hygiene: with modified independence;sit to/from stand Pt Will Perform Tub/Shower Transfer: Shower transfer;with modified independence;ambulating;rolling walker;3 in 1 Additional ADL Goal #1: Pt will be Mod in and OOB for basic ADLs (no rail, HOB flat, increased time) Additional ADL Goal #2: Pt will be able to state 3/3 back precautions Additional ADL Goal #3: Pt will be able to donn and doff her brace independently  Plan Discharge plan needs to be updated;Frequency needs to be updated    Co-evaluation                 AM-PAC OT "6 Clicks" Daily Activity     Outcome Measure   Help from another person eating meals?: None Help from another person taking care of personal grooming?: A Little Help from another person toileting, which includes using toliet, bedpan, or urinal?: A Little Help from another person bathing (including washing, rinsing, drying)?: A Lot Help  from another person to put on and taking off regular upper body clothing?: A Little Help from another person to put on and taking off regular lower body clothing?: A Lot 6 Click Score: 17    End of Session Equipment Utilized During Treatment: Gait belt;Back brace;Rolling walker  OT Visit Diagnosis: Unsteadiness on feet (R26.81);Other abnormalities of gait and mobility (R26.89);Muscle weakness (generalized) (M62.81);Pain Pain - part of body:  (back)   Activity Tolerance Patient tolerated treatment well   Patient Left in chair;with call bell/phone within reach   Nurse Communication Mobility status;Other (comment) (need for follow up OT; 3in1)        Time: 4696-2952 OT Time Calculation (min): 45 min  Charges: OT General Charges $OT Visit: 1 Visit OT Treatments $Self Care/Home Management : 38-52 mins  [facility],  OT/L   Acute OT Clinical Specialist Acute Rehabilitation Services Pager [phone] Office [phone]    [facility] 01/14/2020, 8:46 AM

## 2020-01-14 NOTE — Evaluation (Signed)
Physical Therapy Evaluation Patient Details Name: [name] MRN: [id number] DOB: 10/10/1950 Today's Date: 01/14/2020   History of Present Illness  Redo decompressive lumbar laminectomy with complete medial facetectomies and radical retinotomies of the L4 and L5 nerve roots, posterior fusion. PHMx: DM, HTN, PTSD, and anxiety  Clinical Impression  Patient is s/p above surgery resulting in functional limitations due to the deficits listed below (see PT Problem List).  Patient will benefit from skilled PT to increase their independence and safety with mobility to allow discharge to home once she is moving a little better. Pt lives alone with occasional assist.         Follow Up Recommendations Home health PT;Supervision for mobility/OOB    Equipment Recommendations  Rolling walker with 5" wheels    Recommendations for Other Services       Precautions / Restrictions Precautions Precautions: Back Precaution Booklet Issued: Yes (comment) Precaution Comments: Can have brace off for trips to bathroom and to shower Required Braces or Orthoses: Spinal Brace Spinal Brace: Applied in sitting position Restrictions Weight Bearing Restrictions: No      Mobility  Bed Mobility Overal bed mobility:  (NT, pt up in chair upon PTs arrival)           Transfers Overall transfer level: Needs assistance Equipment used: Rolling walker (2 wheeled) Transfers: Sit to/from Stand Sit to Stand: Min guard Stand pivot transfers: Min guard       General transfer comment:  (VCs for hand placement.  )  Ambulation/Gait Ambulation/Gait assistance: Min guard Gait Distance (Feet): 160 Feet Assistive device: Rolling walker (2 wheeled) Gait Pattern/deviations: Step-through pattern;Decreased stride length;Narrow base of support Gait velocity: very slow gait   General Gait Details: no loss of balance with gait.    Stairs            Wheelchair Mobility    Modified Rankin (Stroke  Patients Only)       Balance Overall balance assessment:  (Pt relies on RW to maintain balance)                                           Pertinent Vitals/Pain Pain Assessment: 0-10 Pain Score: 7  Pain Location: low back with mobility Pain Descriptors / Indicators: Aching;Guarding;Grimacing;Moaning Pain Intervention(s): Limited activity within patient's tolerance;Premedicated before session    Home Living Family/patient expects to be discharged to:: Private residence Living Arrangements: Alone Available Help at Discharge: Family;Friend(s);Other (Comment) (available some in the evenings) Type of Home: Independent living facility Home Access: Level entry     Home Layout: One level Home Equipment: Grab bars - toilet;Grab bars - tub/shower;Hand held shower head      Prior Function Level of Independence: Independent               Hand Dominance   Dominant Hand: Right    Extremity/Trunk Assessment   Upper Extremity Assessment Upper Extremity Assessment: Overall WFL for tasks assessed    Lower Extremity Assessment Lower Extremity Assessment: Generalized weakness (some generlaized weakness, likely due to back pain/recent su)    Cervical / Trunk Assessment Cervical / Trunk Assessment: Normal  Communication   Communication: No difficulties  Cognition Arousal/Alertness: Awake/alert Behavior During Therapy: Anxious Overall Cognitive Status: Within Functional Limits for tasks assessed  General Comments: Able to recall 3/3 back precautions      General Comments General comments (skin integrity, edema, etc.): Pt had family member present, Patsy, for entire session.      Exercises     Assessment/Plan    PT Assessment Patient needs continued PT services  PT Problem List Decreased mobility;Decreased knowledge of use of DME;Decreased safety awareness;Pain       PT Treatment Interventions DME  instruction;Gait training;Therapeutic activities;Patient/family education    PT Goals (Current goals can be found in the Care Plan section)  Acute Rehab PT Goals Patient Stated Goal: to be able to move around better with less pain PT Goal Formulation: With patient Time For Goal Achievement: 01/21/20 Potential to Achieve Goals: Good    Frequency Min 5X/week   Barriers to discharge        Co-evaluation               AM-PAC PT "6 Clicks" Mobility  Outcome Measure Help needed turning from your back to your side while in a flat bed without using bedrails?: A Little Help needed moving from lying on your back to sitting on the side of a flat bed without using bedrails?: A Little Help needed moving to and from a bed to a chair (including a wheelchair)?: A Little Help needed standing up from a chair using your arms (e.g., wheelchair or bedside chair)?: A Little Help needed to walk in hospital room?: A Little Help needed climbing 3-5 steps with a railing? : A Little 6 Click Score: 18    End of Session Equipment Utilized During Treatment: Gait belt;Back brace Activity Tolerance: Patient tolerated treatment well Patient left: in chair;with call bell/phone within reach;with family/visitor present Nurse Communication: Mobility status;Other (comment) (BP 104/49 after gait) PT Visit Diagnosis: Difficulty in walking, not elsewhere classified (R26.2)    Time: 0802-2336 PT Time Calculation (min) (ACUTE ONLY): 37 min   Charges:   PT Evaluation $PT Eval Moderate Complexity: 1 Mod PT Treatments $Gait Training: 8-22 mins        Lavonia Dana, PT   Acute Rehabilitation Services  Pager [phone] Office [phone] 01/14/2020   Melvern Banker 01/14/2020, 10:01 AM

## 2020-01-14 NOTE — Progress Notes (Signed)
Patient is transferred from room 3C02 to unit 5M08 at this time. Alert and in stable condition. Report given to receiving nurse Ebony Hail, RN with all questions answered. Left unit via bed with all belongings and sister at side.

## 2020-01-14 NOTE — Progress Notes (Signed)
Neurosurgery Service Progress Note  Subjective: No acute events overnight. No new complaints this morning. Legs pain have significantly improved.   Objective: Vitals:   01/13/20 2340 01/14/20 0348 01/14/20 0719 01/14/20 0818  BP: 137/79 (!) 109/57 (!) 119/55 (!) 135/58  Pulse: 68 78 80 83  Resp: 18 18 16 20   Temp: 97.9 F (36.6 C) (!) 97.5 F (36.4 C) 98.2 F (36.8 C)   TempSrc: Oral Oral Oral   SpO2: 97% 98% 98% 100%  Weight:      Height:       Temp (24hrs), Avg:97.8 F (36.6 C), Min:97 F (36.1 C), Max:98.2 F (36.8 C)  CBC Latest Ref Rng & Units 01/10/2020 03/04/2018 12/29/2016  WBC 4.0 - 10.5 K/uL 4.6 4.8 5.0  Hemoglobin 12.0 - 15.0 g/dL 13.3 13.2 12.6  Hematocrit 36 - 46 % 38.7 38.3 35.7(L)  Platelets 150 - 400 K/uL 228 263 221   BMP Latest Ref Rng & Units 01/10/2020 12/27/2019 08/24/2019  Glucose 70 - 99 mg/dL 92 234(H) 148(H)  BUN 8 - 23 mg/dL 14 14 15   Creatinine 0.44 - 1.00 mg/dL 0.78 0.88 1.01  BUN/Creat Ratio 6 - 22 (calc) - NOT APPLICABLE -  Sodium 782 - 145 mmol/L 140 142 143  Potassium 3.5 - 5.1 mmol/L 3.5 4.2 4.4  Chloride 98 - 111 mmol/L 104 101 104  CO2 22 - 32 mmol/L 28 30 31   Calcium 8.9 - 10.3 mg/dL 9.1 9.8 9.5    Intake/Output Summary (Last 24 hours) at 01/14/2020 1005 Last data filed at 01/13/2020 1200 Gross per 24 hour  Intake 600 ml  Output 325 ml  Net 275 ml    Current Facility-Administered Medications:  .  0.9 %  sodium chloride infusion, 250 mL, Intravenous, Continuous, Kary Kos, MD .  acetaminophen (TYLENOL) tablet 650 mg, 650 mg, Oral, Q4H PRN, 650 mg at 01/14/20 0509 **OR** acetaminophen (TYLENOL) suppository 650 mg, 650 mg, Rectal, Q4H PRN, Kary Kos, MD .  albuterol (VENTOLIN HFA) 108 (90 Base) MCG/ACT inhaler 2 puff, 2 puff, Inhalation, Q6H, Kary Kos, MD, 2 puff at 01/14/20 0756 .  alum & mag hydroxide-simeth (MAALOX/MYLANTA) 200-200-20 MG/5ML suspension 30 mL, 30 mL, Oral, Q6H PRN, Kary Kos, MD .  clonazePAM Bobbye [place]) tablet 1  mg, 1 mg, Oral, QID PRN, Kary Kos, MD, 1 mg at 01/13/20 2145 .  cyclobenzaprine (FLEXERIL) tablet 10 mg, 10 mg, Oral, TID PRN, Kary Kos, MD, 10 mg at 01/13/20 2145 .  dexamethasone (DECADRON) injection 4 mg, 4 mg, Intravenous, Q6H, Kary Kos, MD, 4 mg at 01/14/20 0509 .  gabapentin (NEURONTIN) capsule 800 mg, 800 mg, Oral, QHS, Kary Kos, MD, 800 mg at 01/13/20 2145 .  glipiZIDE (GLUCOTROL) tablet 5 mg, 5 mg, Oral, QAC breakfast, Kary Kos, MD .  hydrochlorothiazide (HYDRODIURIL) tablet 25 mg, 25 mg, Oral, Daily, Kary Kos, MD, 25 mg at 01/13/20 1337 .  HYDROcodone-acetaminophen (NORCO) 7.5-325 MG per tablet 1 tablet, 1 tablet, Oral, Q6H PRN, Kary Kos, MD .  HYDROmorphone (DILAUDID) injection 0.5 mg, 0.5 mg, Intravenous, Q2H PRN, Kary Kos, MD, 0.5 mg at 01/13/20 1337 .  insulin aspart (novoLOG) injection 0-15 Units, 0-15 Units, Subcutaneous, TID WC, Kary Kos, MD, 5 Units at 01/13/20 1747 .  insulin aspart (novoLOG) injection 0-5 Units, 0-5 Units, Subcutaneous, QHS, Kary Kos, MD, 5 Units at 01/13/20 2155 .  ipratropium (ATROVENT) 0.06 % nasal spray 2 spray, 2 spray, Each Nare, QID PRN, Kary Kos, MD .  menthol-cetylpyridinium (CEPACOL) lozenge 3 mg, 1  lozenge, Oral, PRN **OR** phenol (CHLORASEPTIC) mouth spray 1 spray, 1 spray, Mouth/Throat, PRN, Kary Kos, MD .  Olopatadine HCl 0.7 % SOLN 1 drop, 1 drop, Ophthalmic, Daily PRN, Kary Kos, MD .  ondansetron [facility]) tablet 4 mg, 4 mg, Oral, Q6H PRN **OR** ondansetron (ZOFRAN) injection 4 mg, 4 mg, Intravenous, Q6H PRN, Kary Kos, MD .  oxyCODONE (Oxy IR/ROXICODONE) immediate release tablet 10 mg, 10 mg, Oral, Q3H PRN, Kary Kos, MD, 10 mg at 01/14/20 0510 .  pantoprazole (PROTONIX) EC tablet 40 mg, 40 mg, Oral, Daily, Kary Kos, MD .  potassium chloride (KLOR-CON) CR tablet 10 mEq, 10 mEq, Oral, Daily, Kary Kos, MD, 10 mEq at 01/13/20 1352 .  QUEtiapine (SEROQUEL) tablet 50 mg, 50 mg, Oral, QHS, Kary Kos, MD, 50 mg at  01/13/20 2146 .  sodium chloride flush (NS) 0.9 % injection 3 mL, 3 mL, Intravenous, Q12H, Kary Kos, MD .  sodium chloride flush (NS) 0.9 % injection 3 mL, 3 mL, Intravenous, PRN, Kary Kos, MD .  triamcinolone (KENALOG) 0.1 % paste 1 application, 1 application, Mouth/Throat, BID PRN, Kary Kos, MD   Physical Exam: AOx3, PERRL, EOMI, FS, MAE, strength R PF/DF: 5/5, L PF/DF: 5/5, unable to do hip flexion due to pain. Incision: c/d/i Still having lower back pain, denies legs pain, denies numbness. Pt is not ready to go home today. Pt lives by herself and has no support at home.  Assessment & Plan: 69 y.o.  S/p redo decompressive lumbar laminectomy with complete medial facetectomies and radical retinotomies of the L4 and L5 nerve roots, posterior fusion. PHMx: DM, HTN, PTSD, and anxiety.  -transfer -SCDs -continue PT/OT  Osie Cheeks, NP 01/14/20 10:05 AM

## 2020-01-14 NOTE — Progress Notes (Signed)
Patient refused the inhaler and said she only took it last winter and she is not short of breath. She will only take it when needed.

## 2020-01-14 NOTE — Progress Notes (Signed)
NEW ADMISSION NOTE New Admission Note:   Arrival Method: BED from [facility] Mental Orientation: A&O x 4 Telemetry: no Assessment: Completed Skin: intact, surgical incision lumbar, honey comb dressing and tegaderm IV: left hand Pain: denies Tubes: none Safety Measures: Safety Fall Prevention Plan has been given, discussed and signed Admission: Completed [facility] Orientation: Patient has been orientated to the room, unit and staff.  Family: Sister at bedside  Orders have been reviewed and implemented. Will continue to monitor the patient. Call light has been placed within reach and bed alarm has been activated.   Berneta Levins, RN

## 2020-01-15 DIAGNOSIS — M4316 Spondylolisthesis, lumbar region: Secondary | ICD-10-CM | POA: Diagnosis not present

## 2020-01-15 DIAGNOSIS — E119 Type 2 diabetes mellitus without complications: Secondary | ICD-10-CM | POA: Diagnosis not present

## 2020-01-15 DIAGNOSIS — M5126 Other intervertebral disc displacement, lumbar region: Secondary | ICD-10-CM | POA: Diagnosis not present

## 2020-01-15 DIAGNOSIS — Z79899 Other long term (current) drug therapy: Secondary | ICD-10-CM | POA: Diagnosis not present

## 2020-01-15 DIAGNOSIS — I10 Essential (primary) hypertension: Secondary | ICD-10-CM | POA: Diagnosis not present

## 2020-01-15 DIAGNOSIS — Z7984 Long term (current) use of oral hypoglycemic drugs: Secondary | ICD-10-CM | POA: Diagnosis not present

## 2020-01-15 DIAGNOSIS — M48061 Spinal stenosis, lumbar region without neurogenic claudication: Secondary | ICD-10-CM | POA: Diagnosis not present

## 2020-01-15 LAB — GLUCOSE, CAPILLARY
Glucose-Capillary: 209 mg/dL — ABNORMAL HIGH (ref 70–99)
Glucose-Capillary: 238 mg/dL — ABNORMAL HIGH (ref 70–99)
Glucose-Capillary: 179 mg/dL — ABNORMAL HIGH (ref 70–99)
Glucose-Capillary: 236 mg/dL — ABNORMAL HIGH (ref 70–99)

## 2020-01-15 MED ORDER — ALBUTEROL SULFATE (2.5 MG/3ML) 0.083% IN NEBU: 2.5000 mg | INHALATION_SOLUTION | Freq: Four times a day (QID) | RESPIRATORY_TRACT | Status: DC | PRN

## 2020-01-15 NOTE — Plan of Care (Signed)
  Problem: Activity: Goal: Ability to avoid complications of mobility impairment will improve Outcome: Progressing   

## 2020-01-15 NOTE — Care Management Obs Status (Signed)
[place] NOTIFICATION   Patient Details  Name: [name] MRN: [id number] Date of Birth: March 15, 1951   Medicare Observation Status Notification Given:  Yes    Norina Buzzard, RN 01/15/2020, 4:40 PM

## 2020-01-15 NOTE — Progress Notes (Addendum)
Occupational Therapy Treatment Patient Details Name: [name] MRN: [id number] DOB: 1950/11/24 Today's Date: 01/15/2020    History of present illness Redo decompressive lumbar laminectomy with complete medial facetectomies and radical retinotomies of the L4 and L5 nerve roots, posterior fusion. PHMx: DM, HTN, PTSD, and anxiety   OT comments  Spoke with RN prior to treatment to premedicate. Patient in side lying upon arrival reports she just got back to bed after using the bathroom. With encouragement patient agreeable to sitting upright at EOB for LB dressing + AE education. OT provide visual demo of reacher + sock aid for LB dressing, patient return demo ability to doff + don sock with AE at supervision level. Educate patient to utilize reacher for don/doff underwear and pants as well. Patient is very tearful throughout session due to pain, with min A to fully lift LEs back onto bed.    Follow Up Recommendations  Home health OT;Supervision - Intermittent    Equipment Recommendations  3 in 1 bedside commode       Precautions / Restrictions Precautions Precautions: Back Precaution Booklet Issued: Yes (comment) Precaution Comments: Can have brace off for trips to bathroom and to shower Required Braces or Orthoses: Spinal Brace Spinal Brace: Applied in sitting position Restrictions Weight Bearing Restrictions: No       Mobility Bed Mobility Overal bed mobility: Needs Assistance Bed Mobility: Rolling;Sidelying to Sit;Sit to Sidelying Rolling: Supervision Sidelying to sit: Supervision     Sit to sidelying: Min assist General bed mobility comments: patient demonstrates log roll technique adequately, does require min A to fully lift LEs back into bed from sidelying position. patient very tearful with all bed mobility     Balance Overall balance assessment: Mild deficits observed, not formally tested                                         ADL either  performed or assessed with clinical judgement   ADL Overall ADL's : Needs assistance/impaired                     Lower Body Dressing: Supervision/safety;Sitting/lateral leans;With adaptive equipment;Cueing for compensatory techniques Lower Body Dressing Details (indicate cue type and reason): educate patient in use of reacher + sock aid for LB dressing tasks. patient able to return demo at supervision level. also educate patient how to use reacher to assist donning underwear/pants. patient reports she doesn't wear socks or shoes often at home "unless I'm going outside" in which she has slide on crock shoes.                General ADL Comments: in addition to reacher and sock aid provided patient with LH sponge for LB bathing.                Cognition Arousal/Alertness: Awake/alert Behavior During Therapy: Anxious Overall Cognitive Status: Within Functional Limits for tasks assessed                                 General Comments: very tearful                   Pertinent Vitals/ Pain       Pain Assessment: Faces Faces Pain Scale: Hurts whole lot Pain Location: low back with mobility Pain Descriptors / Indicators: Grimacing;Guarding;Crying  Pain Intervention(s): Premedicated before session         Frequency  Min 3X/week        Progress Toward Goals  OT Goals(current goals can now be found in the care plan section)  Progress towards OT goals: Progressing toward goals  Acute Rehab OT Goals Patient Stated Goal: to be able to move around better with less pain OT Goal Formulation: With patient Time For Goal Achievement: 01/27/20 Potential to Achieve Goals: Good  Plan Discharge plan remains appropriate       AM-PAC OT "6 Clicks" Daily Activity     Outcome Measure   Help from another person eating meals?: None Help from another person taking care of personal grooming?: A Little Help from another person toileting, which includes using  toliet, bedpan, or urinal?: A Little Help from another person bathing (including washing, rinsing, drying)?: A Lot Help from another person to put on and taking off regular upper body clothing?: A Little Help from another person to put on and taking off regular lower body clothing?: A Little 6 Click Score: 18    End of Session Equipment Utilized During Treatment: Other (comment) (reacher, sock aid)  OT Visit Diagnosis: Unsteadiness on feet (R26.81);Other abnormalities of gait and mobility (R26.89);Muscle weakness (generalized) (M62.81);Pain Pain - part of body:  (back)   Activity Tolerance Patient limited by pain   Patient Left in bed;with call bell/phone within reach   Nurse Communication Mobility status        Time: 7001-7494 OT Time Calculation (min): 23 min  Charges: OT General Charges $OT Visit: 1 Visit OT Treatments $Self Care/Home Management : 23-37 mins  Delbert Phenix OT OT pager: [phone]   Rosemary Holms 01/15/2020, 12:36 PM

## 2020-01-15 NOTE — Care Management Obs Status (Signed)
[place] NOTIFICATION   Patient Details  Name: [name] MRN: [id number] Date of Birth: 1950/09/03   Medicare Observation Status Notification Given:   Yes    Norina Buzzard, RN 01/15/2020, 1:03 PM

## 2020-01-15 NOTE — Plan of Care (Signed)
  Problem: Education: Goal: Ability to verbalize activity precautions or restrictions will improve Outcome: Progressing   

## 2020-01-15 NOTE — Anesthesia Postprocedure Evaluation (Signed)
Anesthesia Post Note  Patient: [name]  Procedure(s) Performed: Posterior Lumbar Interbody Fusion  - Lumbar two-Lumbar three (N/A Back)     Patient location during evaluation: PACU Anesthesia Type: General Level of consciousness: awake and alert Pain management: pain level controlled Vital Signs Assessment: post-procedure vital signs reviewed and stable Respiratory status: spontaneous breathing, nonlabored ventilation, respiratory function stable and patient connected to nasal cannula oxygen Cardiovascular status: blood pressure returned to baseline and stable Postop Assessment: no apparent nausea or vomiting Anesthetic complications: no   No complications documented.  Last Vitals:  Vitals:   01/15/20 0458 01/15/20 0854  BP: 129/63 131/65  Pulse: 70 62  Resp: 18 16  Temp: 37 C 36.7 C  SpO2: 96% 94%    Last Pain:  Vitals:   01/15/20 1554  TempSrc:   PainSc: Asleep                 Catalina Gravel

## 2020-01-15 NOTE — TOC Initial Note (Addendum)
Transition of Care [facility]) - Initial/Assessment Note    Patient Details  Name: [name] MRN: [id number] Date of Birth: November 14, 1950  Transition of Care [facility]) CM/SW Contact:    Norina Buzzard, RN Phone Number: 01/15/2020, 2:06 PM  Clinical Narrative: Pt admitted with redo decompressive lumbar laminectomy with complete medial facetectomies and radical retinotomies of the L4 and L5 nerve roots, posterior fusion. PMH: DM, HTN, PTSD, and anxiety. Received referral for [facility] PT. PT/OT are recommending RW, 3-in-1 BSC, and home OT. Met with pt to discuss D/C plan. She plans to return home with the support of her sister and cousin. She lives alone. She reports that she is not going to SNF. Her mother lived in a facility and she did not like it. She reports that she was able to drive prior to sx. She agrees with DME recommendations for a RW and a 3-in-1-BSC. She also agrees for [facility] services. Discussed preference for a [facility] agency and provided her with a list of CMS Medicare.gov compare list. She agreed to use [facility]. Contacted Corey with [facility] and he accepted the referral.  Pt tearful at times. She reports that her mother died recently. She reports that she doesn't have all the equipment at home like the side rails on the bed. Asked pt if she needs a hospital bed but she declined. Explained OBS status and provided pt with a copy of the form and she is upset about this. Asked pt if she wants to talk to a chaplain but she declined.  Notified Osie Cheeks, NP that pt needs an order for [facility] and DME.   Please f/u with HHOT and DME order. CM did not call DME agency. Awaiting for order. Please contact [place] for [facility] OT.  Expected Discharge Plan: [place]     Patient Goals and CMS Choice Patient states their goals for this hospitalization and ongoing recovery are:: to get better CMS Medicare.gov Compare Post Acute Care list provided to:: Patient Choice offered  to / list presented to : Patient  Expected Discharge Plan and Services Expected Discharge Plan: [place]   Discharge Planning Services: CM Consult Post Acute Care Choice: [place] arrangements for the past 2 months: Single Family Home                 DME Arranged: 3-N-1, Walker rolling DME Agency: [employer]       HH Arranged: PT HH Agency: [place] Date [place]: 01/15/20 Time [place]: 55 Representative spoke with at [place]: Georgina Snell  Prior Living Arrangements/Services Living arrangements for the past 2 months: [place] Lives with:: Self Patient language and need for interpreter reviewed:: Yes Do you feel safe going back to the place where you live?: Yes          Current home services: DME, Home OT, Home PT    Activities of Daily Living Home Assistive Devices/Equipment: Eyeglasses ADL Screening (condition at time of admission) Patient's cognitive ability adequate to safely complete daily activities?: Yes Is the patient deaf or have difficulty hearing?: No Does the patient have difficulty seeing, even when wearing glasses/contacts?: No Does the patient have difficulty concentrating, remembering, or making decisions?: No Patient able to express need for assistance with ADLs?: Yes Does the patient have difficulty dressing or bathing?: Yes Independently performs ADLs?: No Communication: Independent Dressing (OT): Needs assistance Is this a change from baseline?: Change from baseline,  expected to last <3days Grooming: Independent Feeding: Independent Bathing: Needs assistance Is this a change from baseline?: Change from baseline, expected to last <3 days Toileting: Needs assistance Is this a change from baseline?: Change from baseline, expected to last <3 days In/Out Bed: Needs assistance Is this a change from baseline?: Change from baseline, expected to last <3 days Does the patient have  difficulty walking or climbing stairs?: Yes Weakness of Legs: Right Weakness of Arms/Hands: None  Permission Sought/Granted Permission sought to share information with : Case Manager                Emotional Assessment Appearance:: Appears stated age, Well-Groomed, Developmentally appropriate Attitude/Demeanor/Rapport: Engaged, Crying, Grieving Affect (typically observed): Pleasant, Afraid/Fearful, Calm Orientation: : Oriented to Self, Oriented to Place, Oriented to  Time, Oriented to Situation      Admission diagnosis:  HNP (herniated nucleus pulposus), lumbar [M51.26] Patient Active Problem List   Diagnosis Date Noted  . HNP (herniated nucleus pulposus), lumbar 01/13/2020  . PTSD (post-traumatic stress disorder)   . Polyneuropathy associated with underlying disease ([place]) 07/26/2019  . Allergic rhinitis 07/26/2019  . Atherosclerosis of aorta ([place]) 04/04/2019  . Myalgia due to statin 11/18/2017  . Depression, major, recurrent, in partial remission ([place]) 10/16/2017  . Insomnia 10/16/2017  . Chronic pain syndrome 09/07/2017  . Spinal stenosis of lumbar region 07/08/2017  . DOE (dyspnea on exertion) 12/16/2016  . Family history of early CAD 12/16/2016  . Other chest pain 12/16/2016  . Arthritis of left acromioclavicular joint 08/13/2016  . Left shoulder pain 05/12/2016  . Cervical spondylosis without myelopathy 05/12/2016  . Rotator cuff syndrome, left 05/12/2016  . Restless leg syndrome 05/12/2016  . Primary osteoarthritis, left hand 05/12/2016  . MELENA 10/18/2008  . GASTROPARESIS 05/30/2008  . Type 2 diabetes mellitus with neurological complications ([place]) 63/04/6008  . HYPERCHOLESTEROLEMIA 05/04/2008  . HYPOKALEMIA 05/04/2008  . GERD 05/04/2008  . Constipation 05/04/2008  . HEMATEMESIS 05/04/2008  . Essential hypertension 05/04/2008   PCP:  Martin[place], Betty G, MD Pharmacy:   [facility] - [place], [place] [address] Phone: [phone] Fax: [phone]  [facility] [phone] - [place], [place] - [address] [place] [address] Phone: [phone] Fax: [phone]     Social Determinants of Health (SDOH) Interventions    Readmission Risk Interventions No flowsheet data found.

## 2020-01-15 NOTE — Progress Notes (Signed)
Physical Therapy Treatment Patient Details Name: [name] MRN: [id number] DOB: January 31, 1951 Today's Date: 01/15/2020    History of Present Illness Redo decompressive lumbar laminectomy with complete medial facetectomies and radical retinotomies of the L4 and L5 nerve roots, posterior fusion. PHMx: DM, HTN, PTSD, and anxiety    PT Comments    Pt making progress towards her goals through walking with increased speed this date, per pt report. Discussed pt's functional status with nurse, who reported the pt was having a lot of difficulty getting up out of bed this morning, but was improving in independence with inc reps and as day progressed. Educated pt on benefits of CIR or SNF, but pt resistant/refused and agitated with this discussion thus ceased and agreed to recommend [facility] therapy for PT follow-up upon d/c. Educated pt on recommendations for bedside commode at night for safety purpose, with pt agreeable. Reviewed spinal precautions. Pt able to perform all functional mobility with supervision - min guard without LOB. Will continue to follow-up with acute PT to address her deficits to maximize her independence and safety with all functional mobility.   Follow Up Recommendations  Home health PT;Supervision for mobility/OOB     Equipment Recommendations  Rolling walker with 5" wheels;3in1 (PT)    Recommendations for Other Services       Precautions / Restrictions Precautions Precautions: Back Precaution Comments: Can have brace off for trips to bathroom and to shower Required Braces or Orthoses: Spinal Brace Spinal Brace: Applied in sitting position Restrictions Weight Bearing Restrictions: No    Mobility  Bed Mobility Overal bed mobility: Needs Assistance Bed Mobility: Rolling;Sidelying to Sit Rolling: Supervision Sidelying to sit: Supervision       General bed mobility comments: Bed flat, without utilziing bed rials pt performed log roll appropriately and transitioned  sidelying to sit L EOB with extra time due to pain. Cuued pt to ascend trunk when LEs fall off EOB.  Transfers Overall transfer level: Needs assistance Equipment used: Rolling walker (2 wheeled) Transfers: Sit to/from Stand Sit to Stand: Min guard         General transfer comment: Cued pt on porper hand placement, requiring extra time to come to stand due to pain. Minimal unsteadiness noted upon coming to stand, but no overt LOB.  Ambulation/Gait Ambulation/Gait assistance: Min guard Gait Distance (Feet): 440 Feet (bouts of 400 > 20 > 20 ) Assistive device: Rolling walker (2 wheeled) Gait Pattern/deviations: Step-through pattern;Decreased stride length;Narrow base of support Gait velocity: decreased Gait velocity interpretation: <1.31 ft/sec, indicative of household ambulator General Gait Details: Ambulates at slow pace, cuing pt to inc step length, with success. No LOB noted,   Stairs             Wheelchair Mobility    Modified Rankin (Stroke Patients Only)       Balance Overall balance assessment: Mild deficits observed, not formally tested                                          Cognition Arousal/Alertness: Awake/alert Behavior During Therapy: Agitated (when discussing possible d/c plans for continued PT) Overall Cognitive Status: Within Functional Limits for tasks assessed                                 General Comments: Pt tearful when discussing her  recent family issues and pt refusing to go to SNF or CIR due to past experiences with her friends and family. Pt stated "I will not discuss it again!" when mentioning a SNF or CIR.      Exercises      General Comments        Pertinent Vitals/Pain Pain Assessment: Faces Faces Pain Scale: Hurts whole lot Pain Location: low back with mobility Pain Descriptors / Indicators: Operative site guarding;Grimacing;Guarding Pain Intervention(s): Limited activity within patient's  tolerance;Monitored during session    Home Living                      Prior Function            PT Goals (current goals can now be found in the care plan section) Acute Rehab PT Goals Patient Stated Goal: to return home PT Goal Formulation: With patient Time For Goal Achievement: 01/21/20 Potential to Achieve Goals: Good Progress towards PT goals: Progressing toward goals    Frequency    Min 5X/week      PT Plan Current plan remains appropriate    Co-evaluation              AM-PAC PT "6 Clicks" Mobility   Outcome Measure  Help needed turning from your back to your side while in a flat bed without using bedrails?: None Help needed moving from lying on your back to sitting on the side of a flat bed without using bedrails?: None Help needed moving to and from a bed to a chair (including a wheelchair)?: A Little Help needed standing up from a chair using your arms (e.g., wheelchair or bedside chair)?: A Little Help needed to walk in hospital room?: A Little Help needed climbing 3-5 steps with a railing? : A Little 6 Click Score: 20    End of Session Equipment Utilized During Treatment: Gait belt;Back brace Activity Tolerance: Patient tolerated treatment well Patient left: in chair;with call bell/phone within reach;with family/visitor present (friend present) Nurse Communication: Mobility status PT Visit Diagnosis: Unsteadiness on feet (R26.81);Other abnormalities of gait and mobility (R26.89);Muscle weakness (generalized) (M62.81);Difficulty in walking, not elsewhere classified (R26.2);Pain Pain - Right/Left:  (back) Pain - part of body:  (back)     Time: 1335-1415 PT Time Calculation (min) (ACUTE ONLY): 40 min  Charges:  $Gait Training: 23-37 mins $Therapeutic Activity: 8-22 mins                     Moishe Spice, PT, DPT Acute Rehabilitation Services  Pager: [phone] Office: [phone]    Orvan Falconer 01/15/2020, 4:37 PM

## 2020-01-15 NOTE — Plan of Care (Signed)
  Problem: Safety: Goal: Ability to remain free from injury will improve Outcome: Progressing   Problem: Education: Goal: Ability to verbalize activity precautions or restrictions will improve Outcome: Progressing   Problem: Activity: Goal: Ability to avoid complications of mobility impairment will improve Outcome: Progressing

## 2020-01-15 NOTE — Progress Notes (Signed)
Neurosurgery Service Progress Note  Subjective: No acute events overnight. No new complaints this morning. Pt is able to ambulate with PT and lower back pain improved from yesterday.  Objective: Vitals:   01/14/20 1600 01/14/20 2059 01/15/20 0458 01/15/20 0854  BP: (!) 119/47 (!) 131/54 129/63 131/65  Pulse: 74 68 70 62  Resp: 18 18 18 16   Temp: 97.6 F (36.4 C) 98 F (36.7 C) 98.6 F (37 C) 98 F (36.7 C)  TempSrc: Oral Oral  Oral  SpO2: 98% 95% 96% 94%  Weight:      Height:       Temp (24hrs), Avg:98.2 F (36.8 C), Min:98 F (36.7 C), Max:98.6 F (37 C)  CBC Latest Ref Rng & Units 01/10/2020 03/04/2018 12/29/2016  WBC 4.0 - 10.5 K/uL 4.6 4.8 5.0  Hemoglobin 12.0 - 15.0 g/dL 13.3 13.2 12.6  Hematocrit 36 - 46 % 38.7 38.3 35.7(L)  Platelets 150 - 400 K/uL 228 263 221   BMP Latest Ref Rng & Units 01/10/2020 12/27/2019 08/24/2019  Glucose 70 - 99 mg/dL 92 234(H) 148(H)  BUN 8 - 23 mg/dL 14 14 15   Creatinine 0.44 - 1.00 mg/dL 0.78 0.88 1.01  BUN/Creat Ratio 6 - 22 (calc) - NOT APPLICABLE -  Sodium 539 - 145 mmol/L 140 142 143  Potassium 3.5 - 5.1 mmol/L 3.5 4.2 4.4  Chloride 98 - 111 mmol/L 104 101 104  CO2 22 - 32 mmol/L 28 30 31   Calcium 8.9 - 10.3 mg/dL 9.1 9.8 9.5    Intake/Output Summary (Last 24 hours) at 01/15/2020 1646 Last data filed at 01/15/2020 1252 Gross per 24 hour  Intake 363 ml  Output 0 ml  Net 363 ml    Current Facility-Administered Medications:  .  0.9 %  sodium chloride infusion, 250 mL, Intravenous, Continuous, Kary Kos, MD .  acetaminophen (TYLENOL) tablet 650 mg, 650 mg, Oral, Q4H PRN, 650 mg at 01/14/20 0509 **OR** acetaminophen (TYLENOL) suppository 650 mg, 650 mg, Rectal, Q4H PRN, Kary Kos, MD .  albuterol (PROVENTIL) (2.5 MG/3ML) 0.083% nebulizer solution 2.5 mg, 2.5 mg, Inhalation, Q6H PRN, Kary Kos, MD .  alum & mag hydroxide-simeth (MAALOX/MYLANTA) 200-200-20 MG/5ML suspension 30 mL, 30 mL, Oral, Q6H PRN, Kary Kos, MD .   clonazePAM Bobbye [place]) tablet 1 mg, 1 mg, Oral, QID PRN, Kary Kos, MD, 1 mg at 01/14/20 2131 .  cyclobenzaprine (FLEXERIL) tablet 10 mg, 10 mg, Oral, TID PRN, Kary Kos, MD, 10 mg at 01/14/20 2131 .  dexamethasone (DECADRON) injection 4 mg, 4 mg, Intravenous, Q6H, Kary Kos, MD, 4 mg at 01/15/20 1245 .  gabapentin (NEURONTIN) capsule 800 mg, 800 mg, Oral, QHS, Kary Kos, MD, 800 mg at 01/14/20 2131 .  glipiZIDE (GLUCOTROL) tablet 5 mg, 5 mg, Oral, QAC breakfast, Kary Kos, MD, 5 mg at 01/15/20 0651 .  hydrochlorothiazide (HYDRODIURIL) tablet 25 mg, 25 mg, Oral, Daily, Kary Kos, MD, 25 mg at 01/15/20 0924 .  HYDROcodone-acetaminophen (NORCO) 7.5-325 MG per tablet 1 tablet, 1 tablet, Oral, Q6H PRN, Kary Kos, MD .  HYDROmorphone (DILAUDID) injection 0.5 mg, 0.5 mg, Intravenous, Q2H PRN, Kary Kos, MD, 0.5 mg at 01/15/20 1527 .  insulin aspart (novoLOG) injection 0-15 Units, 0-15 Units, Subcutaneous, TID WC, Kary Kos, MD, 5 Units at 01/15/20 1243 .  insulin aspart (novoLOG) injection 0-5 Units, 0-5 Units, Subcutaneous, QHS, Kary Kos, MD, 3 Units at 01/14/20 2134 .  ipratropium (ATROVENT) 0.06 % nasal spray 2 spray, 2 spray, Each Nare, QID PRN, Kary Kos,  MD .  menthol-cetylpyridinium (CEPACOL) lozenge 3 mg, 1 lozenge, Oral, PRN **OR** phenol (CHLORASEPTIC) mouth spray 1 spray, 1 spray, Mouth/Throat, PRN, Kary Kos, MD .  Olopatadine HCl 0.7 % SOLN 1 drop, 1 drop, Ophthalmic, Daily PRN, Kary Kos, MD .  ondansetron (ZOFRAN) tablet 4 mg, 4 mg, Oral, Q6H PRN **OR** ondansetron (ZOFRAN) injection 4 mg, 4 mg, Intravenous, Q6H PRN, Kary Kos, MD .  oxyCODONE (Oxy IR/ROXICODONE) immediate release tablet 10 mg, 10 mg, Oral, Q3H PRN, Kary Kos, MD, 10 mg at 01/15/20 0924 .  pantoprazole (PROTONIX) EC tablet 40 mg, 40 mg, Oral, Daily, Kary Kos, MD, 40 mg at 01/15/20 0925 .  potassium chloride (KLOR-CON) CR tablet 10 mEq, 10 mEq, Oral, Daily, Kary Kos, MD, 10 mEq at 01/15/20 0924 .   QUEtiapine (SEROQUEL) tablet 50 mg, 50 mg, Oral, QHS, Kary Kos, MD, 50 mg at 01/13/20 2146 .  sodium chloride flush (NS) 0.9 % injection 3 mL, 3 mL, Intravenous, Q12H, Kary Kos, MD, 3 mL at 01/15/20 1252 .  sodium chloride flush (NS) 0.9 % injection 3 mL, 3 mL, Intravenous, PRN, Kary Kos, MD .  triamcinolone (KENALOG) 0.1 % paste 1 application, 1 application, Mouth/Throat, BID PRN, Kary Kos, MD   Physical Exam: AOx3, PERRL, EOMI, FS, MAE, strength R PF/DF: 5/5, L PF/DF: 5/5, unable to do hip flexion due to pain. Incision: c/d/i Still having lower back pain- improving since yesterday, denies legs pain, denies numbness. Pt is not ready to go home today. Pt lives by herself and has no support at home. Assessment & Plan: 69 y.o. s/p redo decompressive lumbar laminectomy with complete medial facetectomies and radical retinotomies of the L4 and L5 nerve roots, posterior fusion  -continue PT/OT -SCDS -supportive care  Rod Mae 01/15/20 4:46 PM

## 2020-01-16 DIAGNOSIS — Z7984 Long term (current) use of oral hypoglycemic drugs: Secondary | ICD-10-CM | POA: Diagnosis not present

## 2020-01-16 DIAGNOSIS — M48061 Spinal stenosis, lumbar region without neurogenic claudication: Secondary | ICD-10-CM | POA: Diagnosis not present

## 2020-01-16 DIAGNOSIS — M5126 Other intervertebral disc displacement, lumbar region: Secondary | ICD-10-CM | POA: Diagnosis not present

## 2020-01-16 DIAGNOSIS — I10 Essential (primary) hypertension: Secondary | ICD-10-CM | POA: Diagnosis not present

## 2020-01-16 DIAGNOSIS — M4316 Spondylolisthesis, lumbar region: Secondary | ICD-10-CM | POA: Diagnosis not present

## 2020-01-16 DIAGNOSIS — E119 Type 2 diabetes mellitus without complications: Secondary | ICD-10-CM | POA: Diagnosis not present

## 2020-01-16 DIAGNOSIS — Z79899 Other long term (current) drug therapy: Secondary | ICD-10-CM | POA: Diagnosis not present

## 2020-01-16 LAB — GLUCOSE, CAPILLARY
Glucose-Capillary: 193 mg/dL — ABNORMAL HIGH (ref 70–99)
Glucose-Capillary: 167 mg/dL — ABNORMAL HIGH (ref 70–99)

## 2020-01-16 NOTE — Discharge Summary (Signed)
Physician Discharge Summary  Patient ID: [name] MRN: [id number] DOB/AGE: 10/27/1950 69 y.o. Estimated body mass index is 29.45 kg/m as calculated from the following:   Height as of this encounter: _0  (1.727 m).   Weight as of this encounter: 87.9 kg.   Admit date: 01/13/2020 Discharge date: 01/16/2020  Admission Diagnoses: Herniated nucleus pulposus L2-3 with recurrent disc rupture  Discharge Diagnoses: Same Active Problems:   HNP (herniated nucleus pulposus), lumbar   Discharged Condition: good  Hospital Course: Patient had a hospital underwent redo decompressive laminectomy and interbody fusion L2-3.  Postop the patient did very well covering the floor and for a complete resolution of preoperative radicular symptoms back pain settle down over the first couple days postoperatively and by postop day 3 he stable for discharge home with scheduled follow-up in 2 weeks.  [name]  Consults: Significant Diagnostic Studies: Treatments: L2-3 posterior lumbar interbody fusion Discharge Exam: Blood pressure (!) 169/72, pulse (!) 57, temperature 98.4 F (36.9 C), resp. rate 18, height _1  (1.727 m), weight 87.9 kg, SpO2 95 %. Strength 5/5 wound clean dry and intact  Disposition: Home  Discharge Instructions    Face-to-face encounter (required for Medicare/Medicaid patients)   Complete by: As directed    I Elaina Hoops certify that this patient is under my care and that I, or a nurse practitioner or physician's assistant working with me, had a face-to-face encounter that meets the physician face-to-face encounter requirements with this patient on 01/16/2020. The encounter with the patient was in whole, or in part for the following medical condition(s) which is the primary reason for home health care (List medical condition): Nucleus pulposus lumbar  spinal stenosis   The encounter with the patient was in whole, or in part, for the following medical condition, which is the primary reason for home health care: Herniated nucleus pulposus lumbar spinal stenosis   I certify that, based on my findings, the following services are medically necessary home health services: Physical therapy   Reason for Medically Necessary Home Health Services: Therapy- Therapeutic Exercises to Increase Strength and Endurance   My clinical findings support the need for the above services: Unable to leave home safely without assistance and/or assistive device   Further, I certify that my clinical findings support that this patient is homebound due to: Unable to leave home safely without assistance   Home Health   Complete by: As directed    To provide the following care/treatments: PT   Walker rolling   Complete by: As directed      Allergies as of 01/16/2020      Reactions   Hydromorphone Nausea And Vomiting, Other (See Comments)   Made the patient feel "spaced out," also Made the patient feel "spaced out," also   Morphine And Related Itching   With high doses   Penicillins Rash   Has patient had a PCN reaction causing immediate rash, facial/tongue/throat swelling, SOB or lightheadedness with hypotension: Yes Has patient had a PCN reaction causing severe rash involving mucus membranes or skin necrosis: No Has patient had a PCN reaction that required hospitalization: No Has patient had a PCN reaction occurring within the last 10 years: No If all of the above answers are "NO", then may proceed with Cephalosporin use.      Medication List    TAKE these medications  Accu-Chek Aviva Plus w/Device Kit Use to check blood sugar 3 times daily   Accu-Chek Aviva Soln Use as directed   Accu-Chek Softclix Lancets lancets Use to test blood sugar 3 times daily. Dx: E11.8   albuterol 108 (90 Base) MCG/ACT inhaler Commonly known as: VENTOLIN HFA INHALE 2 PUFFS  INTO THE LUNGS EVERY 6 HOURS AS NEEDED FOR WHEEZING OR SHORTNESS OF BREATH   B-D SINGLE USE SWABS REGULAR Pads Use to test blood sugar 3 times daily   clonazePAM 1 MG tablet Commonly known as: KLONOPIN TAKE 1 TABLET BY MOUTH TWICE A DAY AS NEEDED FOR ANXIETY CONTINUE WEANING OFF AS DIRECTED What changed: See the new instructions.   gabapentin 400 MG capsule Commonly known as: NEURONTIN Take 1 capsule (400 mg total) by mouth 3 (three) times daily. What changed:   how much to take  when to take this   glipiZIDE 5 MG tablet Commonly known as: GLUCOTROL Take 1 tablet (5 mg total) by mouth daily before breakfast.   glucose blood test strip Commonly known as: Accu-Chek Aviva Use to test blood sugar 3 times daily. Dx: 11.8   hydrochlorothiazide 25 MG tablet Commonly known as: HYDRODIURIL Take 1 tablet (25 mg total) by mouth daily.   HYDROcodone-acetaminophen 7.5-325 MG tablet Commonly known as: NORCO Take 1 tablet by mouth every 6 (six) hours as needed for moderate pain.   ipratropium 0.06 % nasal spray Commonly known as: ATROVENT Place 2 sprays into both nostrils 4 (four) times daily. What changed:   when to take this  reasons to take this   omeprazole 40 MG capsule Commonly known as: PRILOSEC Take 1 capsule (40 mg total) by mouth daily.   Pazeo 0.7 % Soln Generic drug: Olopatadine HCl Apply 1 drop to eye daily. What changed:   when to take this  reasons to take this   potassium chloride 10 MEQ tablet Commonly known as: KLOR-CON TAKE 1 TABLET BY MOUTH EVERY DAY   QUEtiapine 100 MG tablet Commonly known as: SEROQUEL Take 0.5 tablets (50 mg total) by mouth at bedtime.   triamcinolone 0.1 % paste Commonly known as: KENALOG Use as directed 1 application in the mouth or throat 2 (two) times daily. What changed:   when to take this  reasons to take this   Trulicity 1.5 HQ/1.9XJ Sopn Generic drug: Dulaglutide Inject 1.5 mg into the skin once a  week. What changed: when to take this       Follow-up Information    Care, [facility] Follow up.   Specialty: Home Health Services Why: [facility] will provide home health physical therapy. Contact information: [place] [address] [phone]               Signed: Elaina Hoops 01/16/2020, 2:30 PM

## 2020-01-16 NOTE — Progress Notes (Signed)
Occupational Therapy Treatment Patient Details Name: [name] MRN: [id number] DOB: 1950/11/02 Today's Date: 01/16/2020    History of present illness Redo decompressive lumbar laminectomy with complete medial facetectomies and radical retinotomies of the L4 and L5 nerve roots, posterior fusion. PHMx: DM, HTN, PTSD, and anxiety   OT comments  Pt supine in bed and agreeable to OT.  Eager for [employer] home today and just met with Dr. Saintclair Halsted.  Patient demonstrating ability to complete transfers with supervision, LB dressing with supervision (with or without AE, using figure 4 technique with ease today), and functional mobility with supervision to min guard and no AD.  Reviewed BLT precautions, AE and safety, as well as energy conservation.  Discussed precaution adherence during ADL and IADL tasks.  No questions or concerns. DC plan remains appropriate.    Follow Up Recommendations  Home health OT;Supervision - Intermittent    Equipment Recommendations  3 in 1 bedside commode    Recommendations for Other Services      Precautions / Restrictions Precautions Precautions: Back Precaution Booklet Issued: Yes (comment) Precaution Comments: Can have brace off for trips to bathroom and to shower Required Braces or Orthoses: Spinal Brace Spinal Brace: Applied in sitting position Restrictions Weight Bearing Restrictions: No       Mobility Bed Mobility Overal bed mobility: Modified Independent Bed Mobility: Supine to Sit     Supine to sit: Modified independent (Device/Increase time)        Transfers Overall transfer level: Needs assistance Equipment used: None Transfers: Sit to/from Stand Sit to Stand: Supervision Stand pivot transfers: Min guard       General transfer comment: for safety, good technique and posture     Balance Overall balance assessment: Mild deficits observed, not formally tested                                         ADL either  performed or assessed with clinical judgement   ADL Overall ADL's : Needs assistance/impaired     Grooming: Supervision/safety;Standing       Lower Body Bathing: Supervison/ safety;Sit to/from stand;Cueing for compensatory techniques Lower Body Bathing Details (indicate cue type and reason): has long sponge, educated on completing seated and using figure 4 technique as needed Upper Body Dressing : Supervision/safety;Sitting Upper Body Dressing Details (indicate cue type and reason): donning brace without assist  Lower Body Dressing: Supervision/safety;Sit to/from stand;Cueing for compensatory techniques;With adaptive equipment Lower Body Dressing Details (indicate cue type and reason): has AE, able to complete figure 4 technique with supervision; no assist required Toilet Transfer: Supervision/safety;Ambulation           Functional mobility during ADLs: Supervision/safety;Cueing for safety General ADL Comments: increased time to recall back precautions, reviewed techniques for LB self care and precaution adherence during everyday functional ADL/IADL performance      Vision       Perception     Praxis      Cognition Arousal/Alertness: Awake/alert Behavior During Therapy: WFL for tasks assessed/performed Overall Cognitive Status: Within Functional Limits for tasks assessed                                          Exercises     Shoulder Instructions       General Comments  Pertinent Vitals/ Pain       Pain Assessment: No/denies pain Pain Score: 3  Pain Location: low back with mobility  Home Living                                          Prior Functioning/Environment              Frequency  Min 3X/week        Progress Toward Goals  OT Goals(current goals can now be found in the care plan section)  Progress towards OT goals: Progressing toward goals  Acute Rehab OT Goals Patient Stated Goal: home today OT  Goal Formulation: With patient  Plan Discharge plan remains appropriate;Frequency remains appropriate    Co-evaluation                 AM-PAC OT "6 Clicks" Daily Activity     Outcome Measure   Help from another person eating meals?: None Help from another person taking care of personal grooming?: A Little Help from another person toileting, which includes using toliet, bedpan, or urinal?: A Little Help from another person bathing (including washing, rinsing, drying)?: A Little Help from another person to put on and taking off regular upper body clothing?: A Little Help from another person to put on and taking off regular lower body clothing?: A Lot 6 Click Score: 18    End of Session Equipment Utilized During Treatment: Back brace  OT Visit Diagnosis: Unsteadiness on feet (R26.81);Other abnormalities of gait and mobility (R26.89);Muscle weakness (generalized) (M62.81);Pain   Activity Tolerance Patient tolerated treatment well   Patient Left in bed;with call bell/phone within reach;with family/visitor present;with nursing/sitter in room   Nurse Communication Mobility status        Time: 1422-1440 OT Time Calculation (min): 18 min  Charges: OT General Charges $OT Visit: 1 Visit OT Treatments $Self Care/Home Management : 8-22 mins  Jolaine Artist, OT [place] Pager [phone] Office [phone]    Delight Stare 01/16/2020, 2:49 PM

## 2020-01-16 NOTE — Progress Notes (Addendum)
Physical Therapy Treatment Patient Details Name: [name] [place] MRN: [id number] DOB: Feb 10, 1951 Today's Date: 01/16/2020    History of Present Illness Redo decompressive lumbar laminectomy with complete medial facetectomies and radical retinotomies of the L4 and L5 nerve roots, posterior fusion. PHMx: DM, HTN, PTSD, and anxiety    PT Comments    Pt agreeable to ambulation, pt states she wants to go home right now but willing to walk to stretch her legs; pt performed ambulation with min guard without AD, PT continues to recommend use of RW upon discharge; pt demonstrating balance deficits during ambulation without AD; pt verbalized understanding of recommendations; pt continues to have deficits in balance, strength, coordination and gait and will benefit from skilled PT to address deficits to maximize independence with functional mobility prior to discharge.     Follow Up Recommendations  Home health PT;Supervision for mobility/OOB     Equipment Recommendations  Rolling walker with 5" wheels;3in1 (PT)    Recommendations for Other Services       Precautions / Restrictions Precautions Precautions: Back Precaution Booklet Issued: Yes (comment) Precaution Comments: Can have brace off for trips to bathroom and to shower Required Braces or Orthoses: Spinal Brace Spinal Brace: Applied in sitting position Restrictions Weight Bearing Restrictions: No    Mobility  Bed Mobility Overal bed mobility: Modified Independent Bed Mobility: Supine to Sit     Supine to sit: Modified independent (Device/Increase time)        Transfers Overall transfer level: Needs assistance Equipment used: None Transfers: Sit to/from Stand Sit to Stand: Min guard Stand pivot transfers: Min guard          Ambulation/Gait Ambulation/Gait assistance: Min guard Gait Distance (Feet): 250 Feet Assistive device: None Gait Pattern/deviations: Step-through pattern;Decreased stride length Gait  velocity: decreased   General Gait Details: pt with decreased velocity, pt performed ambulation without RW to improve balance and coordination, PT recommending use of RW at home upon discharge to decrease fall risk. pt verbalized understanding   Stairs             Wheelchair Mobility    Modified Rankin (Stroke Patients Only)       Balance                                            Cognition Arousal/Alertness: Awake/alert Behavior During Therapy: Agitated Overall Cognitive Status: Within Functional Limits for tasks assessed                                        Exercises      General Comments  pt able to recall precautions of no bending or twisting, required cueing for lifting; pt able to don brace sitting EOB without assist from therapist      Pertinent Vitals/Pain Pain Assessment: 0-10 Pain Score: 3  Pain Location: low back with mobility    Home Living                      Prior Function            PT Goals (current goals can now be found in the care plan section) Acute Rehab PT Goals Patient Stated Goal: to return home PT Goal Formulation: With patient Time For Goal Achievement: 01/21/20 Potential to  Achieve Goals: Good Progress towards PT goals: Progressing toward goals    Frequency    Min 5X/week      PT Plan Current plan remains appropriate    Co-evaluation              AM-PAC PT "6 Clicks" Mobility   Outcome Measure  Help needed turning from your back to your side while in a flat bed without using bedrails?: None Help needed moving from lying on your back to sitting on the side of a flat bed without using bedrails?: None Help needed moving to and from a bed to a chair (including a wheelchair)?: A Little Help needed standing up from a chair using your arms (e.g., wheelchair or bedside chair)?: A Little Help needed to walk in hospital room?: A Little Help needed climbing 3-5 steps with a  railing? : A Little 6 Click Score: 20    End of Session Equipment Utilized During Treatment: Gait belt;Back brace Activity Tolerance: Patient tolerated treatment well Patient left: in chair;with nursing/sitter in room;with bed alarm set Nurse Communication: Mobility status PT Visit Diagnosis: Unsteadiness on feet (R26.81);Other abnormalities of gait and mobility (R26.89);Muscle weakness (generalized) (M62.81);Difficulty in walking, not elsewhere classified (R26.2);Pain     Time: 4356-8616 PT Time Calculation (min) (ACUTE ONLY): 11 min  Charges:  $Gait Training: 8-22 mins                     Lyanne Co, DPT Acute Rehabilitation Services [phone]   [place] 01/16/2020, 12:41 PM

## 2020-01-16 NOTE — Progress Notes (Signed)
NURSING PROGRESS NOTE  [name] [id number] Discharge Data: 01/16/2020 3:49 PM Attending Provider: No att. providers found ZNB:VAPOLI, Malka So, MD     [name] to be D/C'd Home per MD order.  Discussed with the patient the After Visit Summary and all questions fully answered. All IV's discontinued with no bleeding noted. All belongings returned to patient for patient to take home.   Last Vital Signs:  Blood pressure (!) 169/72, pulse (!) 57, temperature 98.4 F (36.9 C), resp. rate 18, height _0  (1.727 m), weight 87.9 kg, SpO2 95 %.  Discharge Medication List Allergies as of 01/16/2020      Reactions   Hydromorphone Nausea And Vomiting, Other (See Comments)   Made the patient feel "spaced out," also Made the patient feel "spaced out," also   Morphine And Related Itching   With high doses   Penicillins Rash   Has patient had a PCN reaction causing immediate rash, facial/tongue/throat swelling, SOB or lightheadedness with hypotension: Yes Has patient had a PCN reaction causing severe rash involving mucus membranes or skin necrosis: No Has patient had a PCN reaction that required hospitalization: No Has patient had a PCN reaction occurring within the last 10 years: No If all of the above answers are "NO", then may proceed with Cephalosporin use.      Medication List    TAKE these medications   Accu-Chek Aviva Plus w/Device Kit Use to check blood sugar 3 times daily Notes to patient: Continue home schedule    Accu-Chek Aviva Soln Use as directed Notes to patient: Continue home schedule    Accu-Chek Softclix Lancets lancets Use to test blood sugar 3 times daily. Dx: E11.8 Notes to patient: Continue home schedule    albuterol 108 (90 Base) MCG/ACT inhaler Commonly known as: VENTOLIN HFA INHALE 2 PUFFS INTO THE LUNGS EVERY 6 HOURS AS NEEDED FOR WHEEZING OR SHORTNESS OF BREATH   B-D SINGLE USE SWABS REGULAR Pads Use to test blood sugar 3 times daily  Notes to patient: Continue home schedule    clonazePAM 1 MG tablet Commonly known as: KLONOPIN TAKE 1 TABLET BY MOUTH TWICE A DAY AS NEEDED FOR ANXIETY CONTINUE WEANING OFF AS DIRECTED What changed: See the new instructions.   gabapentin 400 MG capsule Commonly known as: NEURONTIN Take 1 capsule (400 mg total) by mouth 3 (three) times daily. What changed:   how much to take  when to take this Notes to patient: 01/16/2020   glipiZIDE 5 MG tablet Commonly known as: GLUCOTROL Take 1 tablet (5 mg total) by mouth daily before breakfast. Notes to patient: 01/17/2020   glucose blood test strip Commonly known as: Accu-Chek Aviva Use to test blood sugar 3 times daily. Dx: 11.8 Notes to patient: Continue home schedule    hydrochlorothiazide 25 MG tablet Commonly known as: HYDRODIURIL Take 1 tablet (25 mg total) by mouth daily. Notes to patient: 01/17/2020   HYDROcodone-acetaminophen 7.5-325 MG tablet Commonly known as: NORCO Take 1 tablet by mouth every 6 (six) hours as needed for moderate pain.   ipratropium 0.06 % nasal spray Commonly known as: ATROVENT Place 2 sprays into both nostrils 4 (four) times daily. What changed:   when to take this  reasons to take this Notes to patient: 01/16/2020   omeprazole 40 MG capsule Commonly known as: PRILOSEC Take 1 capsule (40 mg total) by mouth daily. Notes to patient: 01/17/2020   Pazeo 0.7 % Soln Generic drug: Olopatadine HCl Apply 1 drop to  eye daily. What changed:   when to take this  reasons to take this Notes to patient: 01/17/2020   potassium chloride 10 MEQ tablet Commonly known as: KLOR-CON TAKE 1 TABLET BY MOUTH EVERY DAY Notes to patient: 01/17/2020   QUEtiapine 100 MG tablet Commonly known as: SEROQUEL Take 0.5 tablets (50 mg total) by mouth at bedtime. Notes to patient: 01/16/2020   triamcinolone 0.1 % paste Commonly known as: KENALOG Use as directed 1 application in the mouth or throat 2 (two) times  daily. What changed:   when to take this  reasons to take this Notes to patient: 31/43/8887   Trulicity 1.5 NZ/9.7KQ Sopn Generic drug: Dulaglutide Inject 1.5 mg into the skin once a week. What changed: when to take this Notes to patient: 01/16/2020            Durable Medical Equipment  (From admission, onward)         Start     Ordered   01/16/20 1458  For home use only DME 3 n 1  Once        01/16/20 1458   01/16/20 1458  For home use only DME Walker rolling  Once       Question Answer Comment  Walker: With [place] Wheels   Patient needs a walker to treat with the following condition Weakness      01/16/20 1458

## 2020-01-16 NOTE — Care Management Important Message (Signed)
Important Message  Patient Details  Name: [name] MRN: [id number] Date of Birth: 06-15-1950   Medicare Important Message Given:  Yes - Important Message mailed due to current National Emergency  Verbal consent obtained due to current National Emergency  Relationship to patient: Self Contact Name: Rondalyn Belford Call Date: 01/16/20  Time: 1453 Phone: [phone] Outcome: No Answer/Busy Important Message mailed to: Patient address on file    Delorse Lek 01/16/2020, 2:54 PM

## 2020-01-17 ENCOUNTER — Inpatient Hospital Stay (HOSPITAL_COMMUNITY): Admission: RE | Admit: 2020-01-17 | Payer: Medicare HMO | Source: Ambulatory Visit

## 2020-01-17 ENCOUNTER — Other Ambulatory Visit (HOSPITAL_COMMUNITY): Payer: Medicare HMO

## 2020-01-19 ENCOUNTER — Encounter: Payer: Self-pay | Admitting: Family Medicine

## 2020-01-19 NOTE — Telephone Encounter (Signed)
Please advise 

## 2020-01-20 ENCOUNTER — Telehealth: Payer: Self-pay

## 2020-01-20 DIAGNOSIS — Z4789 Encounter for other orthopedic aftercare: Secondary | ICD-10-CM | POA: Diagnosis not present

## 2020-01-20 DIAGNOSIS — E119 Type 2 diabetes mellitus without complications: Secondary | ICD-10-CM | POA: Diagnosis not present

## 2020-01-20 DIAGNOSIS — M4316 Spondylolisthesis, lumbar region: Secondary | ICD-10-CM | POA: Diagnosis not present

## 2020-01-20 DIAGNOSIS — M1990 Unspecified osteoarthritis, unspecified site: Secondary | ICD-10-CM | POA: Diagnosis not present

## 2020-01-20 DIAGNOSIS — G709 Myoneural disorder, unspecified: Secondary | ICD-10-CM | POA: Diagnosis not present

## 2020-01-20 DIAGNOSIS — F4310 Post-traumatic stress disorder, unspecified: Secondary | ICD-10-CM | POA: Diagnosis not present

## 2020-01-20 DIAGNOSIS — I10 Essential (primary) hypertension: Secondary | ICD-10-CM | POA: Diagnosis not present

## 2020-01-20 DIAGNOSIS — F419 Anxiety disorder, unspecified: Secondary | ICD-10-CM | POA: Diagnosis not present

## 2020-01-20 DIAGNOSIS — M5126 Other intervertebral disc displacement, lumbar region: Secondary | ICD-10-CM | POA: Diagnosis not present

## 2020-01-20 NOTE — Telephone Encounter (Signed)
Spoke with [place] from [facility]. They will be going to see pt for PT while pt recovers from surgery.

## 2020-01-23 ENCOUNTER — Telehealth: Payer: Self-pay | Admitting: Registered Nurse

## 2020-01-23 ENCOUNTER — Encounter: Payer: Self-pay | Admitting: Family Medicine

## 2020-01-23 DIAGNOSIS — G709 Myoneural disorder, unspecified: Secondary | ICD-10-CM | POA: Diagnosis not present

## 2020-01-23 DIAGNOSIS — M4316 Spondylolisthesis, lumbar region: Secondary | ICD-10-CM | POA: Diagnosis not present

## 2020-01-23 DIAGNOSIS — M1990 Unspecified osteoarthritis, unspecified site: Secondary | ICD-10-CM | POA: Diagnosis not present

## 2020-01-23 DIAGNOSIS — Z4789 Encounter for other orthopedic aftercare: Secondary | ICD-10-CM | POA: Diagnosis not present

## 2020-01-23 DIAGNOSIS — I10 Essential (primary) hypertension: Secondary | ICD-10-CM | POA: Diagnosis not present

## 2020-01-23 DIAGNOSIS — E119 Type 2 diabetes mellitus without complications: Secondary | ICD-10-CM | POA: Diagnosis not present

## 2020-01-23 DIAGNOSIS — F419 Anxiety disorder, unspecified: Secondary | ICD-10-CM | POA: Diagnosis not present

## 2020-01-23 DIAGNOSIS — M5126 Other intervertebral disc displacement, lumbar region: Secondary | ICD-10-CM | POA: Diagnosis not present

## 2020-01-23 DIAGNOSIS — F4310 Post-traumatic stress disorder, unspecified: Secondary | ICD-10-CM | POA: Diagnosis not present

## 2020-01-23 MED ORDER — HYDROCODONE-ACETAMINOPHEN 7.5-325 MG PO TABS
1.0000 | ORAL_TABLET | Freq: Four times a day (QID) | ORAL | 0 refills | Status: DC | PRN
Start: 2020-01-23 — End: 2020-02-29

## 2020-01-23 NOTE — Telephone Encounter (Signed)
PMP was Reviewed: Hydrocodone E-scribed. Placed a call to Ms. Tenenbaum regarding the above, she verbalizes understanding.

## 2020-01-24 ENCOUNTER — Encounter: Payer: Self-pay | Admitting: Family Medicine

## 2020-01-26 DIAGNOSIS — M5440 Lumbago with sciatica, unspecified side: Secondary | ICD-10-CM | POA: Diagnosis not present

## 2020-01-27 ENCOUNTER — Telehealth (INDEPENDENT_AMBULATORY_CARE_PROVIDER_SITE_OTHER): Payer: Medicare HMO | Admitting: Family Medicine

## 2020-01-27 ENCOUNTER — Encounter: Payer: Self-pay | Admitting: Family Medicine

## 2020-01-27 VITALS — Ht 68.0 in

## 2020-01-27 DIAGNOSIS — H938X3 Other specified disorders of ear, bilateral: Secondary | ICD-10-CM | POA: Diagnosis not present

## 2020-01-27 DIAGNOSIS — Z9889 Other specified postprocedural states: Secondary | ICD-10-CM | POA: Diagnosis not present

## 2020-01-27 DIAGNOSIS — H9319 Tinnitus, unspecified ear: Secondary | ICD-10-CM

## 2020-01-27 DIAGNOSIS — F419 Anxiety disorder, unspecified: Secondary | ICD-10-CM | POA: Diagnosis not present

## 2020-01-27 MED ORDER — DEBROX 6.5 % OT SOLN: 5.0000 [drp] | Freq: Two times a day (BID) | OTIC | 0 refills | Status: DC

## 2020-01-27 NOTE — Progress Notes (Signed)
Virtual Visit via Telephone Note  I connected with [name] on 10/22/21at  4:00 PM EDT by telephone and verified that I am speaking with the correct person using two identifiers.   I discussed the limitations, risks, security and privacy concerns of performing an evaluation and management service by telephone and the availability of in person appointments. I also discussed with the patient that there may be a patient responsible charge related to this service. The patient expressed understanding and agreed to proceed.  Location patient: home Location provider: work office Participants present for the call: patient, provider Patient did not have a visit in the prior 7 days to address this/these issue(s).  History of Present Illness: [name] is a 69 year old female with history of anxiety, chronic back pain, DM 2, hypertension, allergy rhinitis complaining of ears being stopped up" for about a week. She has not felt congested. Negative for fever, chills, sore throat, cough, wheezing, or dyspnea.  Today she noticed some "waxy" ear drainage from left ear. She has not noted earache. She has had ear lavage a few years ago.  She feels like ears are getting better. She has been able to "pop" her ears and has helped.  She frustrated with constant tinnitus, she is not sure which ear.  She has had this problem intermittently for a while. It is more  noticeable at night when in bed. She has had some intermittent vertigo, meclizine has helped.  She is recovering from lumbar surgery, she was not discharged next day of surgery as planned. She stayed in the hospital for 5 days.She was on IV steroid during hospitalization. She is doing home PT 3 times per week.  Today she was evaluated by surgeon, she was started on Doxycycline to treat surgical wound infection.  She has been through some stress, she feels like she is dealing well so far. Clonazepam really helps with anxiety. She has  tried to discontinue clonazepam a few times in the past because it is a controlled med and she is also on Hydrocodone-acetaminophen 7.5-325 mg for chronic pain. She has decided to continue medication far as it helps. Seroquel 100 mg 1/2 tab is helping with sleep and mood.  Observations/Objective: Patient sounds cheerful and well on the phone. I do not appreciate any SOB,cough,or wheezing. Speech and thought processing are grossly intact.Anxious. Patient reported vitals:Ht 5\' 8"  (1.727 m)   BMI 29.45 kg/m   Assessment and Plan:  1. Ear fullness, bilateral Improving. We discussed possible etiologies: Cerumen impaction and eustachian tube dysfunction among some. Debrox recommended to use in case she has cerumen excess. Continue auto inflation maneuvers. Plain Mucinex may help if eustachian tube dysfunction/ear effusion. Monitor for new symptoms. ENT evaluation can be considered if persistent or worsening symptoms.  2. Tinnitus, unspecified laterality Chronic. We discussed Dx,prognosis,and treatment options (not many). White noise at night may help.  3. Anxiety disorder, unspecified type Stable. Continue Clonazepam 0.5 mg bid.  4. Status post lumbar surgery Complicated with surgical wound infection,per pt report. She started abx today. Appt with surgeon in 2 weeks. Instructed about warning signs.  Follow Up Instructions:  Return if symptoms worsen or fail to improve, for Keep next appt.  I did not refer this patient for an OV in the next 24 hours for this/these issue(s).  I discussed the assessment and treatment plan with the patient. [name] was provided an opportunity to ask questions and all were answered. She agreed with the plan and demonstrated an understanding  of the instructions.   I provided 26 minutes of non-face-to-face time during this encounter.   Nasim Habeeb Martinique, MD

## 2020-01-30 DIAGNOSIS — M5126 Other intervertebral disc displacement, lumbar region: Secondary | ICD-10-CM | POA: Diagnosis not present

## 2020-01-30 DIAGNOSIS — G709 Myoneural disorder, unspecified: Secondary | ICD-10-CM | POA: Diagnosis not present

## 2020-01-30 DIAGNOSIS — E119 Type 2 diabetes mellitus without complications: Secondary | ICD-10-CM | POA: Diagnosis not present

## 2020-01-30 DIAGNOSIS — F4310 Post-traumatic stress disorder, unspecified: Secondary | ICD-10-CM | POA: Diagnosis not present

## 2020-01-30 DIAGNOSIS — M4316 Spondylolisthesis, lumbar region: Secondary | ICD-10-CM | POA: Diagnosis not present

## 2020-01-30 DIAGNOSIS — Z4789 Encounter for other orthopedic aftercare: Secondary | ICD-10-CM | POA: Diagnosis not present

## 2020-01-30 DIAGNOSIS — M1990 Unspecified osteoarthritis, unspecified site: Secondary | ICD-10-CM | POA: Diagnosis not present

## 2020-01-30 DIAGNOSIS — I10 Essential (primary) hypertension: Secondary | ICD-10-CM | POA: Diagnosis not present

## 2020-01-30 DIAGNOSIS — F419 Anxiety disorder, unspecified: Secondary | ICD-10-CM | POA: Diagnosis not present

## 2020-01-31 ENCOUNTER — Telehealth: Payer: Medicare HMO

## 2020-02-01 DIAGNOSIS — M5126 Other intervertebral disc displacement, lumbar region: Secondary | ICD-10-CM | POA: Diagnosis not present

## 2020-02-01 DIAGNOSIS — M4316 Spondylolisthesis, lumbar region: Secondary | ICD-10-CM | POA: Diagnosis not present

## 2020-02-01 DIAGNOSIS — F4310 Post-traumatic stress disorder, unspecified: Secondary | ICD-10-CM | POA: Diagnosis not present

## 2020-02-01 DIAGNOSIS — F419 Anxiety disorder, unspecified: Secondary | ICD-10-CM | POA: Diagnosis not present

## 2020-02-01 DIAGNOSIS — E119 Type 2 diabetes mellitus without complications: Secondary | ICD-10-CM | POA: Diagnosis not present

## 2020-02-01 DIAGNOSIS — M1990 Unspecified osteoarthritis, unspecified site: Secondary | ICD-10-CM | POA: Diagnosis not present

## 2020-02-01 DIAGNOSIS — Z4789 Encounter for other orthopedic aftercare: Secondary | ICD-10-CM | POA: Diagnosis not present

## 2020-02-01 DIAGNOSIS — I10 Essential (primary) hypertension: Secondary | ICD-10-CM | POA: Diagnosis not present

## 2020-02-01 DIAGNOSIS — G709 Myoneural disorder, unspecified: Secondary | ICD-10-CM | POA: Diagnosis not present

## 2020-02-06 DIAGNOSIS — G709 Myoneural disorder, unspecified: Secondary | ICD-10-CM | POA: Diagnosis not present

## 2020-02-06 DIAGNOSIS — M5126 Other intervertebral disc displacement, lumbar region: Secondary | ICD-10-CM | POA: Diagnosis not present

## 2020-02-06 DIAGNOSIS — I10 Essential (primary) hypertension: Secondary | ICD-10-CM | POA: Diagnosis not present

## 2020-02-06 DIAGNOSIS — F4310 Post-traumatic stress disorder, unspecified: Secondary | ICD-10-CM | POA: Diagnosis not present

## 2020-02-06 DIAGNOSIS — E119 Type 2 diabetes mellitus without complications: Secondary | ICD-10-CM | POA: Diagnosis not present

## 2020-02-06 DIAGNOSIS — M4316 Spondylolisthesis, lumbar region: Secondary | ICD-10-CM | POA: Diagnosis not present

## 2020-02-06 DIAGNOSIS — F419 Anxiety disorder, unspecified: Secondary | ICD-10-CM | POA: Diagnosis not present

## 2020-02-06 DIAGNOSIS — Z4789 Encounter for other orthopedic aftercare: Secondary | ICD-10-CM | POA: Diagnosis not present

## 2020-02-06 DIAGNOSIS — M1990 Unspecified osteoarthritis, unspecified site: Secondary | ICD-10-CM | POA: Diagnosis not present

## 2020-02-09 DIAGNOSIS — M4316 Spondylolisthesis, lumbar region: Secondary | ICD-10-CM | POA: Diagnosis not present

## 2020-02-09 DIAGNOSIS — E119 Type 2 diabetes mellitus without complications: Secondary | ICD-10-CM | POA: Diagnosis not present

## 2020-02-09 DIAGNOSIS — M5126 Other intervertebral disc displacement, lumbar region: Secondary | ICD-10-CM | POA: Diagnosis not present

## 2020-02-09 DIAGNOSIS — F419 Anxiety disorder, unspecified: Secondary | ICD-10-CM | POA: Diagnosis not present

## 2020-02-09 DIAGNOSIS — F4310 Post-traumatic stress disorder, unspecified: Secondary | ICD-10-CM | POA: Diagnosis not present

## 2020-02-09 DIAGNOSIS — M1990 Unspecified osteoarthritis, unspecified site: Secondary | ICD-10-CM | POA: Diagnosis not present

## 2020-02-09 DIAGNOSIS — Z4789 Encounter for other orthopedic aftercare: Secondary | ICD-10-CM | POA: Diagnosis not present

## 2020-02-09 DIAGNOSIS — I10 Essential (primary) hypertension: Secondary | ICD-10-CM | POA: Diagnosis not present

## 2020-02-09 DIAGNOSIS — G709 Myoneural disorder, unspecified: Secondary | ICD-10-CM | POA: Diagnosis not present

## 2020-02-13 DIAGNOSIS — M5126 Other intervertebral disc displacement, lumbar region: Secondary | ICD-10-CM | POA: Diagnosis not present

## 2020-02-13 DIAGNOSIS — M4316 Spondylolisthesis, lumbar region: Secondary | ICD-10-CM | POA: Diagnosis not present

## 2020-02-13 DIAGNOSIS — I10 Essential (primary) hypertension: Secondary | ICD-10-CM | POA: Diagnosis not present

## 2020-02-13 DIAGNOSIS — E119 Type 2 diabetes mellitus without complications: Secondary | ICD-10-CM | POA: Diagnosis not present

## 2020-02-13 DIAGNOSIS — Z4789 Encounter for other orthopedic aftercare: Secondary | ICD-10-CM | POA: Diagnosis not present

## 2020-02-13 DIAGNOSIS — F4310 Post-traumatic stress disorder, unspecified: Secondary | ICD-10-CM | POA: Diagnosis not present

## 2020-02-13 DIAGNOSIS — M1990 Unspecified osteoarthritis, unspecified site: Secondary | ICD-10-CM | POA: Diagnosis not present

## 2020-02-13 DIAGNOSIS — G709 Myoneural disorder, unspecified: Secondary | ICD-10-CM | POA: Diagnosis not present

## 2020-02-13 DIAGNOSIS — F419 Anxiety disorder, unspecified: Secondary | ICD-10-CM | POA: Diagnosis not present

## 2020-02-17 ENCOUNTER — Encounter: Payer: Self-pay | Admitting: Family Medicine

## 2020-02-17 ENCOUNTER — Ambulatory Visit (INDEPENDENT_AMBULATORY_CARE_PROVIDER_SITE_OTHER): Payer: Medicare HMO | Admitting: Family Medicine

## 2020-02-17 ENCOUNTER — Other Ambulatory Visit: Payer: Self-pay

## 2020-02-17 VITALS — BP 150/88 | HR 96 | Temp 97.9°F | Resp 16 | Ht 68.0 in | Wt 192.0 lb

## 2020-02-17 DIAGNOSIS — I10 Essential (primary) hypertension: Secondary | ICD-10-CM | POA: Diagnosis not present

## 2020-02-17 DIAGNOSIS — F419 Anxiety disorder, unspecified: Secondary | ICD-10-CM | POA: Diagnosis not present

## 2020-02-17 DIAGNOSIS — F3341 Major depressive disorder, recurrent, in partial remission: Secondary | ICD-10-CM

## 2020-02-17 DIAGNOSIS — G63 Polyneuropathy in diseases classified elsewhere: Secondary | ICD-10-CM

## 2020-02-17 DIAGNOSIS — R252 Cramp and spasm: Secondary | ICD-10-CM | POA: Diagnosis not present

## 2020-02-17 DIAGNOSIS — H9313 Tinnitus, bilateral: Secondary | ICD-10-CM

## 2020-02-17 DIAGNOSIS — E876 Hypokalemia: Secondary | ICD-10-CM | POA: Diagnosis not present

## 2020-02-17 NOTE — Progress Notes (Addendum)
Chief Complaint  Patient presents with  . Tinnitus  . Hypertension   HPI: [name] is a 69 y.o. female, who is here today with above concern. About 4 weeks of "ringing" in ears, not sure which one. Turns TV or fun louder to mask tinnitus,so she can sleep. Problem is more noticeable when it is quite. Negative for earache or drainage. She has had dizziness in the past.  She used OTC wax remover and noted some improvement. Negative for recent travel or URI. + Allergies. She uses Atrovent nasal spray qid as needed.  Negative for fever,chills,sore throat,cough,or wheezing.  HTN: BP has been running high. Stress when she is around her ex-boyfriend.This is causing headaches. BP's elevated 150's/90's. She is on HCTZ 25 mg daily  No headache when she is relax and alone at home. She has not had headache for the past 2 days.  Negative for visual changes, chest pain, dyspnea, palpitation,or focal weakness. She has hx of irregular HR, she sometimes feels irregularity at night when she is in bed. Problem is not exacerbated by exertion or stress and no associated symptoms.  Anxiety and depression:  She has been under a lot of stress. She is recovering from lumbar surgery and trying to help her ex-boyfriend, who is also having some health problems. She is on Clonazepam  Seroquel 100 mg 1/2 tab at bedtime helps with sleep.  Cramps in toes and calves. Alleviated by walking. Cramps happen mainly when she is in bed and have been daily for the past 6 days. She has not noted associated erythema or edema. She has not changed dietary habits. Trying to keep up with fluids.  Lab Results  Component Value Date   CREATININE 0.78 01/10/2020   BUN 14 01/10/2020   NA 140 01/10/2020   K 3.5 01/10/2020   CL 104 01/10/2020   CO2 28 01/10/2020   HypoK+: She is taking 1.5 KLOR bid.  She also would like to have her feet check.  History of peripheral neuropathy: Feet  numbness. She is currently on gabapentin, she is not sure if medication is helping. Gabapentin has helped with RLS. She has not noted skin ulcers.  Review of Systems  Constitutional: Positive for fatigue. Negative for activity change, appetite change and fever.  HENT: Positive for postnasal drip. Negative for mouth sores and nosebleeds.   Gastrointestinal: Negative for abdominal pain, nausea and vomiting.       Negative for changes in bowel habits.  Genitourinary: Negative for decreased urine volume, dysuria and hematuria.  Neurological: Negative for syncope, facial asymmetry and weakness.  Psychiatric/Behavioral: Positive for sleep disturbance. Negative for confusion. The patient is nervous/anxious.   Rest see pertinent positives and negatives per HPI.  Current Outpatient Medications on File Prior to Visit  Medication Sig Dispense Refill  . ACCU-CHEK SOFTCLIX LANCETS lancets Use to test blood sugar 3 times daily. Dx: E11.8 100 each 12  . albuterol (VENTOLIN HFA) 108 (90 Base) MCG/ACT inhaler INHALE 2 PUFFS INTO THE LUNGS EVERY 6 HOURS AS NEEDED FOR WHEEZING OR SHORTNESS OF BREATH 54 g 0  . Alcohol Swabs (B-D SINGLE USE SWABS REGULAR) PADS Use to test blood sugar 3 times daily 100 each 3  . Blood Glucose Calibration (ACCU-CHEK AVIVA) SOLN Use as directed 1 each 2  . Blood Glucose Monitoring Suppl (ACCU-CHEK AVIVA PLUS) w/Device KIT Use to check blood sugar 3 times daily 1 kit 1  . carbamide peroxide (DEBROX) 6.5 % OTIC solution Place 5 drops  into the left ear 2 (two) times daily. 15 mL 0  . clonazePAM (KLONOPIN) 1 MG tablet TAKE 1 TABLET BY MOUTH TWICE A DAY AS NEEDED FOR ANXIETY CONTINUE WEANING OFF AS DIRECTED (Patient taking differently: Take 1 mg by mouth 4 (four) times daily as needed for anxiety. ) 60 tablet 3  . Dulaglutide (TRULICITY) 1.5 GY/6.9SW SOPN Inject 1.5 mg into the skin once a week. (Patient taking differently: Inject 1.5 mg into the skin every Thursday. ) 13 pen 3  .  gabapentin (NEURONTIN) 400 MG capsule Take 1 capsule (400 mg total) by mouth 3 (three) times daily. (Patient taking differently: Take 800 mg by mouth at bedtime. ) 90 capsule 3  . glipiZIDE (GLUCOTROL) 5 MG tablet Take 1 tablet (5 mg total) by mouth daily before breakfast. 30 tablet 3  . glucose blood (ACCU-CHEK AVIVA) test strip Use to test blood sugar 3 times daily. Dx: 11.8 100 each 12  . hydrochlorothiazide (HYDRODIURIL) 25 MG tablet Take 1 tablet (25 mg total) by mouth daily. 90 tablet 3  . HYDROcodone-acetaminophen (NORCO) 7.5-325 MG tablet Take 1 tablet by mouth every 6 (six) hours as needed for moderate pain. 120 tablet 0  . ipratropium (ATROVENT) 0.06 % nasal spray Place 2 sprays into both nostrils 4 (four) times daily. (Patient taking differently: Place 2 sprays into both nostrils 4 (four) times daily as needed (seasonal allergies.). ) 15 mL 3  . Olopatadine HCl (PAZEO) 0.7 % SOLN Apply 1 drop to eye daily. (Patient taking differently: Apply 1 drop to eye daily as needed (allergy). ) 2.5 mL 1  . omeprazole (PRILOSEC) 40 MG capsule Take 1 capsule (40 mg total) by mouth daily. 90 capsule 3  . potassium chloride (KLOR-CON) 10 MEQ tablet TAKE 1 TABLET BY MOUTH EVERY DAY (Patient taking differently: Take 10 mEq by mouth daily. ) 90 tablet 2  . QUEtiapine (SEROQUEL) 100 MG tablet Take 0.5 tablets (50 mg total) by mouth at bedtime. 45 tablet 1  . triamcinolone (KENALOG) 0.1 % paste Use as directed 1 application in the mouth or throat 2 (two) times daily. (Patient taking differently: Use as directed 1 application in the mouth or throat 2 (two) times daily as needed (irritation.). ) 5 g 1   No current facility-administered medications on file prior to visit.    Past Medical History:  Diagnosis Date  . 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 Anxiety   . Arthritis   . Diabetes mellitus without complication ([place])   . GERD (gastroesophageal reflux disease)   . Heart murmur   . Hyperlipidemia   .  Hypertension   . Neuromuscular disorder ([place])   . Palpitations   . Pneumonia   . PONV (postoperative nausea and vomiting)   . PTSD (post-traumatic stress disorder)   . Ringing of ears, left    Allergies  Allergen Reactions  . Hydromorphone Nausea And Vomiting and Other (See Comments)    Made the patient feel "spaced out," also Made the patient feel "spaced out," also  . Morphine And Related Itching    With high doses  . Penicillins Rash    Has patient had a PCN reaction causing immediate rash, facial/tongue/throat swelling, SOB or lightheadedness with hypotension: Yes Has patient had a PCN reaction causing severe rash involving mucus membranes or skin necrosis: No Has patient had a PCN reaction that required hospitalization: No Has patient had a PCN reaction occurring within the last 10 years: No If all of the above answers  are "NO", then may proceed with Cephalosporin use.     Social History   Socioeconomic History  . Marital status: Divorced    Spouse name: Not on file  . Number of children: Not on file  . Years of education: Not on file  . Highest education level: Not on file  Occupational History  . Not on file  Tobacco Use  . Smoking status: Never Smoker  . Smokeless tobacco: Never Used  Vaping Use  . Vaping Use: Never used  Substance and Sexual Activity  . Alcohol use: No  . Drug use: No  . Sexual activity: Not on file  Other Topics Concern  . Not on file  Social History Narrative  . Not on file   Social Determinants of Health   Financial Resource Strain: Low Risk   . Difficulty of Paying Living Expenses: Not hard at all  Food Insecurity:   . Worried About Charity fundraiser in the Last Year: Not on file  . Ran Out of Food in the Last Year: Not on file  Transportation Needs: No Transportation Needs  . Lack of Transportation (Medical): No  . Lack of Transportation (Non-Medical): No  Physical Activity:   . Days of Exercise per Week: Not on file  .  Minutes of Exercise per Session: Not on file  Stress:   . Feeling of Stress : Not on file  Social Connections:   . Frequency of Communication with Friends and Family: Not on file  . Frequency of Social Gatherings with Friends and Family: Not on file  . Attends Religious Services: Not on file  . Active Member of Clubs or Organizations: Not on file  . Attends Archivist Meetings: Not on file  . Marital Status: Not on file    Vitals:   02/17/20 1514  BP: (!) 150/88  Pulse: 96  Resp: 16  Temp: 97.9 F (36.6 C)  SpO2: 96%   Body mass index is 29.19 kg/m.  Physical Exam Vitals and nursing note reviewed.  Constitutional:      General: She is not in acute distress.    Appearance: She is well-developed.  HENT:     Head: Normocephalic and atraumatic.     Right Ear: Tympanic membrane, ear canal and external ear normal.     Left Ear: Tympanic membrane, ear canal and external ear normal.     Mouth/Throat:     Mouth: Mucous membranes are moist.     Pharynx: Oropharynx is clear.  Eyes:     Conjunctiva/sclera: Conjunctivae normal.     Pupils: Pupils are equal, round, and reactive to light.  Neck:     Vascular: No carotid bruit.  Cardiovascular:     Rate and Rhythm: Normal rate and regular rhythm. Occasional extrasystoles are present.    Pulses:          Dorsalis pedis pulses are 2+ on the right side and 2+ on the left side.     Heart sounds: No murmur heard.   Pulmonary:     Effort: Pulmonary effort is normal. No respiratory distress.     Breath sounds: Normal breath sounds.  Abdominal:     Palpations: Abdomen is soft. There is no hepatomegaly or mass.     Tenderness: There is no abdominal tenderness.  Lymphadenopathy:     Cervical: No cervical adenopathy.  Skin:    General: Skin is warm.     Findings: No erythema or rash.  Neurological:  Mental Status: She is alert and oriented to person, place, and time.     Cranial Nerves: No cranial nerve deficit.      Sensory: Sensory deficit present.     Comments: Antalgic gait, not assisted.  Psychiatric:        Mood and Affect: Mood is anxious. Affect is labile.     Comments: Well groomed, good eye contact.    Diabetic Foot Exam - Simple   Simple Foot Form Diabetic Foot exam was performed with the following findings: Yes 02/17/2020  6:22 PM  Visual Inspection No deformities, no ulcerations, no other skin breakdown bilaterally: Yes Sensation Testing See comments: Yes Pulse Check Posterior Tibialis and Dorsalis pulse intact bilaterally: Yes Comments Decreased monofilament bilateral.     ASSESSMENT AND PLAN:  Ms.Momo was seen today for tinnitus and hypertension.  Diagnoses and all orders for this visit: Orders Placed This Encounter  Procedures  . BASIC METABOLIC PANEL WITH GFR  . EKG 12-Lead   Lab Results  Component Value Date   CREATININE 0.99 02/17/2020   BUN 17 02/17/2020   NA 140 02/17/2020   K 4.4 02/17/2020   CL 103 02/17/2020   CO2 25 02/17/2020    Tinnitus of both ears We discussed Dx,prognosis,and the lack of effective treatments. She is established with ENT, will arrange appt. She will let me know if she needs a referral.  Essential hypertension For now continue HCTZ 25 mg daily. Monitor BP regularly and if still elevated we will need to add another antihypertensive agent. Low salt diet. EKG SR, LAD, ? LAFB, normal intervals.PVC x 1, voltage criteria for LVH (AVL).When compared with EKG done on 07/26/19 no significant changes except for PVC. For now we will hold on cardio evaluation ,at least until she recovers from surgery.  Instructed about warning signs.  Bilateral leg cramps Possible etiologies dicussed, meds and chronic medical problems can be contributing factors. Frequently cause cannot be identified. No clear treatment options , she can try Mg oxide 250 mg OTC, some side effects discussed.  Anxiety disorder, unspecified type Continue Clonazepam 1 mg bid  prn.  Depression, major, recurrent, in partial remission (HCC) Continue Seroquel 50 mg daily at bedtime. CBT may also help.  Polyneuropathy associated with underlying disease ([place]) We discussed the importance of adequate foot care and trauma avoidance. Good glucose controlled. Lyrica is another treatment options if Gabapentin is not helping. She can discuss options with pain management.  Hypokalemia Continue KLOR 15 meq bid. Further recommendations will be given according to BMP results.  Spent 49 minutes with pt.  During this time history was obtained and documented, examination was performed, prior labs reviewed, and assessment/plan discussed.   Return if symptoms worsen or fail to improve, for Keep next appt..   Elloise Roark G. Martinique, MD  [facility]. [place] office.   A few things to remember from today's visit:   Essential hypertension - Plan: EKG 12-Lead  Bilateral leg cramps  Tinnitus of both ears  Let me know if you need appt with ENT. Continue monitoring blood pressure. Magnesium may help with leg cramps.  Adequate hydration. Topical icy hot on leg may help.   If you need refills please call your pharmacy. Do not use My Chart to request refills or for acute issues that need immediate attention.    Please be sure medication list is accurate. If a new problem present, please set up appointment sooner than planned today.

## 2020-02-17 NOTE — Patient Instructions (Signed)
A few things to remember from today's visit:   Essential hypertension - Plan: EKG 12-Lead  Bilateral leg cramps  Tinnitus of both ears  Let me know if you need appt with ENT. Continue monitoring blood pressure. Magnesium may help with leg cramps.  Adequate hydration. Topical icy hot on leg may help.   If you need refills please call your pharmacy. Do not use My Chart to request refills or for acute issues that need immediate attention.    Please be sure medication list is accurate. If a new problem present, please set up appointment sooner than planned today.

## 2020-02-18 ENCOUNTER — Other Ambulatory Visit: Payer: Self-pay | Admitting: Physical Medicine & Rehabilitation

## 2020-02-18 ENCOUNTER — Encounter: Payer: Self-pay | Admitting: Family Medicine

## 2020-02-18 DIAGNOSIS — G2581 Restless legs syndrome: Secondary | ICD-10-CM

## 2020-02-18 LAB — BASIC METABOLIC PANEL WITH GFR
BUN: 17 mg/dL (ref 7–25)
CO2: 25 mmol/L (ref 20–32)
Calcium: 9.6 mg/dL (ref 8.6–10.4)
Chloride: 103 mmol/L (ref 98–110)
Creat: 0.99 mg/dL (ref 0.50–0.99)
GFR, Est African American: 68 mL/min/{1.73_m2} (ref >=60)
GFR, Est Non African American: 59 mL/min/{1.73_m2} — ABNORMAL LOW (ref >=60)
Glucose, Bld: 105 mg/dL — ABNORMAL HIGH (ref 65–99)
Potassium: 4.4 mmol/L (ref 3.5–5.3)
Sodium: 140 mmol/L (ref 135–146)

## 2020-02-22 DIAGNOSIS — Z5321 Procedure and treatment not carried out due to patient leaving prior to being seen by health care provider: Secondary | ICD-10-CM | POA: Diagnosis not present

## 2020-02-22 DIAGNOSIS — R9431 Abnormal electrocardiogram [ECG] [EKG]: Secondary | ICD-10-CM | POA: Diagnosis not present

## 2020-02-22 DIAGNOSIS — R42 Dizziness and giddiness: Secondary | ICD-10-CM | POA: Diagnosis not present

## 2020-02-22 DIAGNOSIS — I4949 Other premature depolarization: Secondary | ICD-10-CM | POA: Diagnosis not present

## 2020-02-22 DIAGNOSIS — I491 Atrial premature depolarization: Secondary | ICD-10-CM | POA: Diagnosis not present

## 2020-02-23 ENCOUNTER — Encounter: Payer: Self-pay | Admitting: Family Medicine

## 2020-02-23 DIAGNOSIS — H9313 Tinnitus, bilateral: Secondary | ICD-10-CM

## 2020-02-27 DIAGNOSIS — H9313 Tinnitus, bilateral: Secondary | ICD-10-CM | POA: Diagnosis not present

## 2020-02-27 DIAGNOSIS — H6980 Other specified disorders of Eustachian tube, unspecified ear: Secondary | ICD-10-CM | POA: Diagnosis not present

## 2020-02-29 ENCOUNTER — Encounter: Payer: Self-pay | Admitting: Registered Nurse

## 2020-02-29 ENCOUNTER — Other Ambulatory Visit: Payer: Self-pay

## 2020-02-29 ENCOUNTER — Encounter: Payer: Medicare HMO | Attending: Physical Medicine & Rehabilitation | Admitting: Registered Nurse

## 2020-02-29 VITALS — BP 161/84 | HR 71 | Temp 98.4°F | Wt 189.0 lb

## 2020-02-29 DIAGNOSIS — M47812 Spondylosis without myelopathy or radiculopathy, cervical region: Secondary | ICD-10-CM | POA: Diagnosis not present

## 2020-02-29 DIAGNOSIS — Z5181 Encounter for therapeutic drug level monitoring: Secondary | ICD-10-CM

## 2020-02-29 DIAGNOSIS — G8929 Other chronic pain: Secondary | ICD-10-CM

## 2020-02-29 DIAGNOSIS — M542 Cervicalgia: Secondary | ICD-10-CM | POA: Diagnosis not present

## 2020-02-29 DIAGNOSIS — G894 Chronic pain syndrome: Secondary | ICD-10-CM | POA: Diagnosis not present

## 2020-02-29 DIAGNOSIS — M5412 Radiculopathy, cervical region: Secondary | ICD-10-CM

## 2020-02-29 DIAGNOSIS — Z79891 Long term (current) use of opiate analgesic: Secondary | ICD-10-CM | POA: Diagnosis not present

## 2020-02-29 DIAGNOSIS — M545 Low back pain, unspecified: Secondary | ICD-10-CM | POA: Diagnosis not present

## 2020-02-29 MED ORDER — HYDROCODONE-ACETAMINOPHEN 7.5-325 MG PO TABS
1.0000 | ORAL_TABLET | Freq: Four times a day (QID) | ORAL | 0 refills | Status: DC | PRN
Start: 2020-02-29 — End: 2020-02-29

## 2020-02-29 MED ORDER — HYDROCODONE-ACETAMINOPHEN 7.5-325 MG PO TABS
1.0000 | ORAL_TABLET | Freq: Four times a day (QID) | ORAL | 0 refills | Status: DC | PRN
Start: 2020-02-29 — End: 2020-04-10
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
## 2020-03-01 DIAGNOSIS — I10 Essential (primary) hypertension: Secondary | ICD-10-CM | POA: Diagnosis not present

## 2020-03-01 DIAGNOSIS — I493 Ventricular premature depolarization: Secondary | ICD-10-CM | POA: Diagnosis not present

## 2020-03-01 DIAGNOSIS — R9431 Abnormal electrocardiogram [ECG] [EKG]: Secondary | ICD-10-CM | POA: Diagnosis not present

## 2020-03-01 DIAGNOSIS — E119 Type 2 diabetes mellitus without complications: Secondary | ICD-10-CM | POA: Diagnosis not present

## 2020-03-06 ENCOUNTER — Ambulatory Visit: Payer: Medicare HMO | Admitting: Registered Nurse

## 2020-03-06 DIAGNOSIS — M5440 Lumbago with sciatica, unspecified side: Secondary | ICD-10-CM | POA: Diagnosis not present

## 2020-03-07 ENCOUNTER — Encounter: Payer: Medicare HMO | Admitting: Registered Nurse

## 2020-03-08 LAB — DRUG TOX ALC METAB W/CON, ORAL FLD: Alcohol Metabolite: NEGATIVE ng/mL (ref <25)

## 2020-03-08 LAB — DRUG TOX MONITOR 1 W/CONF, ORAL FLD
Amphetamines: NEGATIVE ng/mL (ref <10)
Barbiturates: NEGATIVE ng/mL (ref <10)
Benzodiazepines: NEGATIVE ng/mL (ref <0.50)
Buprenorphine: NEGATIVE ng/mL (ref <0.10)
Cocaine: NEGATIVE ng/mL (ref <5.0)
Codeine: NEGATIVE ng/mL (ref <2.5)
Dihydrocodeine: 22.3 ng/mL — ABNORMAL HIGH (ref <2.5)
Fentanyl: NEGATIVE ng/mL (ref <0.10)
Heroin Metabolite: NEGATIVE ng/mL (ref <1.0)
Hydrocodone: >250 ng/mL — ABNORMAL HIGH (ref <2.5)
Hydromorphone: NEGATIVE ng/mL (ref <2.5)
MARIJUANA: NEGATIVE ng/mL (ref <2.5)
MDMA: NEGATIVE ng/mL (ref <10)
Meprobamate: NEGATIVE ng/mL (ref <2.5)
Methadone: NEGATIVE ng/mL (ref <5.0)
Morphine: NEGATIVE ng/mL (ref <2.5)
Nicotine Metabolite: NEGATIVE ng/mL (ref <5.0)
Norhydrocodone: 6.2 ng/mL — ABNORMAL HIGH (ref <2.5)
Noroxycodone: NEGATIVE ng/mL (ref <2.5)
Opiates: POSITIVE ng/mL — AB (ref <2.5)
Oxycodone: NEGATIVE ng/mL (ref <2.5)
Oxymorphone: NEGATIVE ng/mL (ref <2.5)
Phencyclidine: NEGATIVE ng/mL (ref <10)
Tapentadol: NEGATIVE ng/mL (ref <5.0)
Tramadol: NEGATIVE ng/mL (ref <5.0)
Zolpidem: NEGATIVE ng/mL (ref <5.0)

## 2020-03-09 ENCOUNTER — Encounter: Payer: Self-pay | Admitting: Family Medicine

## 2020-03-12 ENCOUNTER — Telehealth: Payer: Self-pay | Admitting: *Deleted

## 2020-03-12 ENCOUNTER — Other Ambulatory Visit: Payer: Self-pay | Admitting: Family Medicine

## 2020-03-12 DIAGNOSIS — I10 Essential (primary) hypertension: Secondary | ICD-10-CM

## 2020-03-12 MED ORDER — LOSARTAN POTASSIUM 50 MG PO TABS: 50.0000 mg | ORAL_TABLET | Freq: Every day | ORAL | 1 refills | Status: DC

## 2020-03-12 NOTE — Telephone Encounter (Signed)
Oral swab drug screen was consistent for prescribed medications.  ?

## 2020-03-13 DIAGNOSIS — L570 Actinic keratosis: Secondary | ICD-10-CM | POA: Diagnosis not present

## 2020-03-13 DIAGNOSIS — L814 Other melanin hyperpigmentation: Secondary | ICD-10-CM | POA: Diagnosis not present

## 2020-03-13 DIAGNOSIS — L738 Other specified follicular disorders: Secondary | ICD-10-CM | POA: Diagnosis not present

## 2020-03-13 DIAGNOSIS — Z85828 Personal history of other malignant neoplasm of skin: Secondary | ICD-10-CM | POA: Diagnosis not present

## 2020-04-10 ENCOUNTER — Encounter: Payer: Medicare Other | Attending: Physical Medicine & Rehabilitation | Admitting: Registered Nurse

## 2020-04-10 ENCOUNTER — Encounter: Payer: Self-pay | Admitting: Registered Nurse

## 2020-04-10 ENCOUNTER — Other Ambulatory Visit: Payer: Self-pay

## 2020-04-10 VITALS — BP 124/82 | HR 87 | Temp 97.5°F | Ht 68.0 in | Wt 191.8 lb

## 2020-04-10 DIAGNOSIS — Z79899 Other long term (current) drug therapy: Secondary | ICD-10-CM | POA: Diagnosis not present

## 2020-04-10 DIAGNOSIS — Z79891 Long term (current) use of opiate analgesic: Secondary | ICD-10-CM | POA: Diagnosis not present

## 2020-04-10 DIAGNOSIS — M7918 Myalgia, other site: Secondary | ICD-10-CM | POA: Diagnosis not present

## 2020-04-10 DIAGNOSIS — M47812 Spondylosis without myelopathy or radiculopathy, cervical region: Secondary | ICD-10-CM | POA: Diagnosis not present

## 2020-04-10 DIAGNOSIS — M5412 Radiculopathy, cervical region: Secondary | ICD-10-CM

## 2020-04-10 DIAGNOSIS — Z5181 Encounter for therapeutic drug level monitoring: Secondary | ICD-10-CM

## 2020-04-10 DIAGNOSIS — G8929 Other chronic pain: Secondary | ICD-10-CM | POA: Diagnosis not present

## 2020-04-10 DIAGNOSIS — M546 Pain in thoracic spine: Secondary | ICD-10-CM

## 2020-04-10 DIAGNOSIS — M545 Low back pain, unspecified: Secondary | ICD-10-CM | POA: Insufficient documentation

## 2020-04-10 DIAGNOSIS — G894 Chronic pain syndrome: Secondary | ICD-10-CM | POA: Diagnosis not present

## 2020-04-10 DIAGNOSIS — M542 Cervicalgia: Secondary | ICD-10-CM | POA: Diagnosis not present

## 2020-04-10 MED ORDER — HYDROCODONE-ACETAMINOPHEN 7.5-325 MG PO TABS
1.0000 | ORAL_TABLET | Freq: Four times a day (QID) | ORAL | 0 refills | Status: DC | PRN
Start: 2020-04-10 — End: 2020-04-10

## 2020-04-10 MED ORDER — HYDROCODONE-ACETAMINOPHEN 7.5-325 MG PO TABS
1.0000 | ORAL_TABLET | Freq: Four times a day (QID) | ORAL | 0 refills | Status: DC | PRN
Start: 2020-04-10 — End: 2020-06-05

## 2020-04-10 NOTE — Progress Notes (Signed)
Subjective:    Patient ID: [name], female    DOB: 19-Oct-1950, 70 y.o.   MRN: [id number]  HPI: [name] is a 70 y.o. female who returns for follow up appointment for chronic pain and medication refill. She states her pain is located in her neck radiating into her left shoulder and lower back pain. Also reports increase intensity and frequency with left shoulder pain, requesting injection with Dr Naaman Plummer. Her last injection was on 09/07/2017 with good relief noted.  She will be scheduled with Dr Naaman Plummer, she verbalizes understanding.   She  rates her pain 7. Her  current exercise regime is walking.   Ms. Docken Morphine equivalent is 30.00 MME.She is also prescribed Clonazepam by Dr. Martinique. We have discussed the black box warning of using opioids and benzodiazepines. I highlighted the dangers of using these drugs together and discussed the adverse events including respiratory suppression, overdose, cognitive impairment and importance of compliance with current regimen. We will continue to monitor and adjust as indicated.   Last Oral Swab was Performed on 02/29/2020, it was consistent.    Pain Inventory Average Pain 9 Pain Right Now 7 My pain is intermittent, sharp and aching  In the last 24 hours, has pain interfered with the following? General activity 7 Relation with others 7 Enjoyment of life 7 What TIME of day is your pain at its worst? morning , daytime and night Sleep (in general) Fair  Pain is worse with: bending, sitting, inactivity and standing Pain improves with: rest, heat/ice, therapy/exercise, pacing activities, medication and injections Relief from Meds: 10  Family History  Problem Relation Age of Onset  . Diabetes Mother   . Alzheimer's disease Mother   . Lung cancer Father   . Esophageal cancer Brother   . Colon cancer Neg Hx    Social History   Socioeconomic History  . Marital status: Divorced    Spouse name: Not on file  . Number of  children: Not on file  . Years of education: Not on file  . Highest education level: Not on file  Occupational History  . Not on file  Tobacco Use  . Smoking status: Never Smoker  . Smokeless tobacco: Never Used  Vaping Use  . Vaping Use: Never used  Substance and Sexual Activity  . Alcohol use: No  . Drug use: No  . Sexual activity: Not on file  Other Topics Concern  . Not on file  Social History Narrative  . Not on file   Social Determinants of Health   Financial Resource Strain: Low Risk   . Difficulty of Paying Living Expenses: Not hard at all  Food Insecurity: Not on file  Transportation Needs: No Transportation Needs  . Lack of Transportation (Medical): No  . Lack of Transportation (Non-Medical): No  Physical Activity: Not on file  Stress: Not on file  Social Connections: Not on file   Past Surgical History:  Procedure Laterality Date  . ABDOMINAL HYSTERECTOMY     partial  . ACHILLES TENDON SURGERY Left   . APPENDECTOMY    . BACK SURGERY    . BLADDER SUSPENSION    . BREAST SURGERY Right 2003   milk duct removed  . CHOLECYSTECTOMY    . FRACTURE SURGERY     collar bone  . NOSE SURGERY     x 2   . OTHER SURGICAL HISTORY     Breast Duct removed  . PARTIAL HYSTERECTOMY    . RECTOCELE  REPAIR    . TONSILLECTOMY    . VASCULAR SURGERY Right    right leg  . VEIN SURGERY Right    leg   Past Surgical History:  Procedure Laterality Date  . ABDOMINAL HYSTERECTOMY     partial  . ACHILLES TENDON SURGERY Left   . APPENDECTOMY    . BACK SURGERY    . BLADDER SUSPENSION    . BREAST SURGERY Right 2003   milk duct removed  . CHOLECYSTECTOMY    . FRACTURE SURGERY     collar bone  . NOSE SURGERY     x 2   . OTHER SURGICAL HISTORY     Breast Duct removed  . PARTIAL HYSTERECTOMY    . RECTOCELE REPAIR    . TONSILLECTOMY    . VASCULAR SURGERY Right    right leg  . VEIN SURGERY Right    leg   Past Medical History:  Diagnosis Date  . 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 Anxiety   . Arthritis   . Diabetes mellitus without complication ([place])   . GERD (gastroesophageal reflux disease)   . Heart murmur   . Hyperlipidemia   . Hypertension   . Neuromuscular disorder ([place])   . Palpitations   . Pneumonia   . PONV (postoperative nausea and vomiting)   . PTSD (post-traumatic stress disorder)   . Ringing of ears, left    There were no vitals taken for this visit.  Opioid Risk Score:   Fall Risk Score:  `1  Depression screen PHQ 2/9  Depression screen [facility] 2/9 12/30/2019 12/29/2019 11/04/2019 08/25/2019 04/07/2019 09/10/2018 07/13/2018  Decreased Interest 1 2 2 2  0 1 1  Down, Depressed, Hopeless 1 2 2 2  0 1 1  PHQ - 2 Score 2 4 4 4  0 2 2  Altered sleeping - 3 - 3 1 - -  Tired, decreased energy - 2 - 2 1 - -  Change in appetite - 2 - 2 1 - -  Feeling bad or failure about yourself  - 1 - 2 0 - -  Trouble concentrating - 0 - 2 0 - -  Moving slowly or fidgety/restless - 0 - 2 0 - -  Suicidal thoughts - 0 - 0 0 - -  PHQ-9 Score - 12 - 17 3 - -  Difficult doing work/chores - Somewhat difficult - Very difficult Somewhat difficult - -  Some recent data might be hidden   Review of Systems  Musculoskeletal: Positive for arthralgias, back pain, gait problem and neck pain.  All other systems reviewed and are negative.      Objective:   Physical Exam Vitals and nursing note reviewed.  Constitutional:      Appearance: Normal appearance.  Cardiovascular:     Rate and Rhythm: Normal rate and regular rhythm.     Pulses: Normal pulses.     Heart sounds: Normal heart sounds.  Pulmonary:     Effort: Pulmonary effort is normal.     Breath sounds: Normal breath sounds.  Musculoskeletal:     Cervical back: Normal range of motion and neck supple.     Comments: Normal Muscle Bulk and Muscle Testing Reveals:  Upper Extremities: Right: Full ROM and Muscle Strength 5/5 Left Upper Extremity: Decreased ROM 45 Degrees and Muscle Strength 4/5 Left AC Joint  Tenderness Lumbar Paraspinal Tenderness: L-4-L-5 Lower Extremities : Full ROM abd Muscle Strength 5/5 Left Lower Extremity Flexion Produces Pain into her Left Lower Extremity  Arises from chair with ease Narrow Based Gait   Skin:    General: Skin is warm and dry.  Neurological:     Mental Status: She is alert and oriented to person, place, and time.  Psychiatric:        Mood and Affect: Mood normal.        Behavior: Behavior normal.           Assessment & Plan:  1.Chronic cervicalgia with documented spondylosis on MRI, per Dr. Riley Kill Note and Cervical Radiculitis:ContinueGabapentin, Continue HEP as Tolerated.04/10/2020 2. Left shoulder pain most consistent with left rotator cuff syndrome. Mild DJD. Scheduled for Left Shoulder injection with Dr Riley Kill. Continue HEP as tolerated.Continue to monitor.04/10/2020. Refilled:Hydrocodone7.5/325 mg one tablet every6hours as needed for pain. #120. Second script sent for the following month. We will continue the opioid monitoring program, this consists of regular clinic visits, examinations, urine drug screen, pill counts as well as use of [place] Controlled Substance Reporting system. A 12 month History has been reviewed on the [place] Controlled Substance Reporting Systemon 04/10/2020. 3. Left wrist/finger pain most consistent with OA, ?post traumatic. No complaints today.04/10/2020 4. Anxiety: Continue Klonopin, PCP prescribing. Continue to monitor.04/10/2020 5. Restless Leg syndrome: Continue Gabapentin.04/10/2020 7. Sacroiliac inflammation: S/PRightSI injection on 09/20/2019, with No relief noted. Ms. Madewell seen Dr August Saucer in regards to her pain. Dr Wynetta Emery. Following.  Continue to Monitor.04/10/2020. 8. Myofascial Pain: Continue Robaxin as needed.04/10/2020. 9. Bilateral feet with neuropathic pain:Continue Gabapentin. Continue to monitor.04/10/2020. 10. Herniated Nucleus Pulposus L2-3 with recurrent disc  rupture: S/P  Posterior Lumbar Interbody Fusion - Lumbar two-Lumbar three on 01/13/2020 by Dr Wynetta Emery.  Dr Wynetta Emery Following.   F/U in76months

## 2020-04-17 ENCOUNTER — Telehealth: Payer: Self-pay | Admitting: Family Medicine

## 2020-04-17 DIAGNOSIS — M5440 Lumbago with sciatica, unspecified side: Secondary | ICD-10-CM | POA: Diagnosis not present

## 2020-04-17 DIAGNOSIS — I10 Essential (primary) hypertension: Secondary | ICD-10-CM | POA: Diagnosis not present

## 2020-04-17 DIAGNOSIS — Z6828 Body mass index (BMI) 28.0-28.9, adult: Secondary | ICD-10-CM | POA: Diagnosis not present

## 2020-04-17 MED ORDER — TRULICITY 1.5 MG/0.5ML ~~LOC~~ SOAJ
1.5000 mg | SUBCUTANEOUS | 3 refills | Status: DC
Start: 2020-04-17 — End: 2021-01-15

## 2020-04-17 NOTE — Telephone Encounter (Signed)
Paperwork filled out & Rx printed to be signed. Paperwork placed in MD's folder to be signed.

## 2020-04-17 NOTE — Telephone Encounter (Signed)
Pt dropped off forms to be completed. Placed in Dr. Martinique folder.

## 2020-04-26 ENCOUNTER — Other Ambulatory Visit: Payer: Self-pay | Admitting: Family Medicine

## 2020-04-26 ENCOUNTER — Encounter: Payer: Self-pay | Admitting: Family Medicine

## 2020-04-26 DIAGNOSIS — F419 Anxiety disorder, unspecified: Secondary | ICD-10-CM

## 2020-04-26 DIAGNOSIS — F32A Depression, unspecified: Secondary | ICD-10-CM

## 2020-04-26 NOTE — Telephone Encounter (Signed)
Last OV 02/17/20 Last refill 12/30/19 #60/3 Next OV 05/11/20

## 2020-04-27 ENCOUNTER — Telehealth: Payer: Self-pay | Admitting: *Deleted

## 2020-04-27 NOTE — Telephone Encounter (Addendum)
Prior auth submitted to [employer] for hydrocodone 7.5/325 # 120 and approved 04/26/20-04/26/21.

## 2020-05-07 ENCOUNTER — Encounter: Payer: Self-pay | Admitting: Family Medicine

## 2020-05-11 ENCOUNTER — Other Ambulatory Visit: Payer: Self-pay

## 2020-05-11 ENCOUNTER — Ambulatory Visit: Payer: Medicare Other | Admitting: Family Medicine

## 2020-05-14 ENCOUNTER — Other Ambulatory Visit: Payer: Self-pay | Admitting: Family Medicine

## 2020-05-14 ENCOUNTER — Ambulatory Visit: Payer: Medicare Other | Admitting: Family Medicine

## 2020-05-17 ENCOUNTER — Other Ambulatory Visit: Payer: Self-pay

## 2020-05-18 ENCOUNTER — Ambulatory Visit (INDEPENDENT_AMBULATORY_CARE_PROVIDER_SITE_OTHER): Payer: Medicare Other | Admitting: Family Medicine

## 2020-05-18 ENCOUNTER — Encounter: Payer: Self-pay | Admitting: Family Medicine

## 2020-05-18 VITALS — BP 128/70 | HR 84 | Resp 16 | Ht 68.0 in | Wt 188.1 lb

## 2020-05-18 DIAGNOSIS — K219 Gastro-esophageal reflux disease without esophagitis: Secondary | ICD-10-CM

## 2020-05-18 DIAGNOSIS — E1149 Type 2 diabetes mellitus with other diabetic neurological complication: Secondary | ICD-10-CM | POA: Diagnosis not present

## 2020-05-18 DIAGNOSIS — F3341 Major depressive disorder, recurrent, in partial remission: Secondary | ICD-10-CM

## 2020-05-18 DIAGNOSIS — E876 Hypokalemia: Secondary | ICD-10-CM

## 2020-05-18 DIAGNOSIS — F419 Anxiety disorder, unspecified: Secondary | ICD-10-CM | POA: Diagnosis not present

## 2020-05-18 DIAGNOSIS — G63 Polyneuropathy in diseases classified elsewhere: Secondary | ICD-10-CM | POA: Diagnosis not present

## 2020-05-18 DIAGNOSIS — I700 Atherosclerosis of aorta: Secondary | ICD-10-CM

## 2020-05-18 DIAGNOSIS — I10 Essential (primary) hypertension: Secondary | ICD-10-CM | POA: Diagnosis not present

## 2020-05-18 DIAGNOSIS — L989 Disorder of the skin and subcutaneous tissue, unspecified: Secondary | ICD-10-CM

## 2020-05-18 DIAGNOSIS — G4700 Insomnia, unspecified: Secondary | ICD-10-CM

## 2020-05-18 LAB — POCT GLYCOSYLATED HEMOGLOBIN (HGB A1C): HbA1c, POC (controlled diabetic range): 6.8 % (ref 0.0–7.0)

## 2020-05-18 MED ORDER — GLIPIZIDE 5 MG PO TABS: 5.0000 mg | ORAL_TABLET | Freq: Every day | ORAL | 2 refills | Status: DC

## 2020-05-18 MED ORDER — OMEPRAZOLE 40 MG PO CPDR: 40.0000 mg | DELAYED_RELEASE_CAPSULE | Freq: Every day | ORAL | 2 refills | Status: DC

## 2020-05-18 MED ORDER — MUPIROCIN 2 % EX OINT: 1.0000 "application " | TOPICAL_OINTMENT | Freq: Two times a day (BID) | CUTANEOUS | 0 refills | Status: AC

## 2020-05-18 MED ORDER — QUETIAPINE FUMARATE 100 MG PO TABS: 50.0000 mg | ORAL_TABLET | Freq: Every day | ORAL | 1 refills | Status: DC

## 2020-05-18 NOTE — Assessment & Plan Note (Addendum)
Some of the GI symptoms could be related to this problem. Recommend taking Omeprazole 40 mg at night, before supper,once daily. GERD precautions also recommended.

## 2020-05-18 NOTE — Assessment & Plan Note (Signed)
Stable. Continue Gabapentin 400 mg tid. Adequate foot care to continue. Following with neurologist.

## 2020-05-18 NOTE — Patient Instructions (Addendum)
A few things to remember from today's visit:   If you need refills please call your pharmacy. Do not use My Chart to request refills or for acute issues that need immediate attention. No changes today. Mupirocin ointment on left hand, do not pick on it and if not resolved in a couple week you need to see your dermatologist.   Please be sure medication list is accurate. If a new problem present, please set up appointment sooner than planned today.

## 2020-05-18 NOTE — Assessment & Plan Note (Signed)
HgA1C at goal. Continue Glipizide 5 mg daily and Trulicity 1.5 mg weekly.  Annual eye exam, periodic dental and foot care. F/U in 5-6 months

## 2020-05-18 NOTE — Assessment & Plan Note (Signed)
She has not tolerated statin medications. Continue low fat diet.

## 2020-05-18 NOTE — Assessment & Plan Note (Signed)
Problem has improved with meditation. Continue clonazepam 1 mg twice daily as needed. She understands the risk of interaction with opioid medication.

## 2020-05-18 NOTE — Assessment & Plan Note (Signed)
Continue K-Lor 10 mEq daily. We had discussed HCTZ side effects in the past. Further recommendation will be given according to BMP results.

## 2020-05-18 NOTE — Assessment & Plan Note (Signed)
BP adequately controlled. Continue HCTZ 25 mg daily and low-salt diet. Continue monitoring BP regularly.

## 2020-05-18 NOTE — Assessment & Plan Note (Signed)
Problem is stable. Continue Seroquel 50 mg daily.

## 2020-05-18 NOTE — Progress Notes (Signed)
HPI: [name] is a 70 y.o. female, who is here today for 6 months follow up.   She was last seen on 02/17/20 for acute visit. Since her last visit she has followed with neurosurgeon and has seen ENT.  Back slowly getting better after surgery. Because PT cost she is doing it by her own at home.Marland Kitchen LLE pain that started after surgery. No numbness or tingling. She has not noted saddle anesthesia or changes in bowel/bladder function. She has an appt with neurosurgeon next week.  DM II:Dx'ed in 2014. She is on Trulicity 1.5 mg weekly and Glipizide 5 mg before breakfast. She has not had hypoglycemic events. Negative for polydipsia,polyuria, or polyphagia.  Lab Results  Component Value Date   HGBA1C 7.5 (H) 12/27/2019   Lab Results  Component Value Date   MICROALBUR 0.8 12/27/2019   MICROALBUR <0.7 04/04/2019   Peripheral neuropathy: She is on Gabapentin 400 mg tid. She follows with neurologist.  HTN: Evaluated in the ER on 03/01/20 because elevated BP, 159/73. She is on HCTZ 25 mg daily. She did not try Losartan. Negative for severe/frequent headache, visual changes, chest pain, dyspnea, palpitation, claudication, focal weakness, or edema.  HypoK+ on KLOR 10 meq daily.  Lab Results  Component Value Date   CREATININE 0.99 02/17/2020   BUN 17 02/17/2020   NA 140 02/17/2020   K 4.4 02/17/2020   CL 103 02/17/2020   CO2 25 02/17/2020   Anxiety: She is on Clonazepam 1 mg bid prn. She is doing meditation, which has helped with anxiety. She is staying home most of the time. She takes med as need for panic attacks. Insomnia and depression: She is on Seroquel 100 mg 1/2 tab daily. Sleeping better. She feels like she is dealing better with stress and she is more optimistic.  Abdominal pain and nausea after food intake, 2 weeks ago she increased frequency of omeprazole 40 mg from qd to twice daily (added a 2nd dose at night before supper) and problem has  improved. She had some episode of vomiting. Worse with certain food. Hx of gastroparesis.  Dorsum of left hand with small skin lesion,noted about 2 months ago and not healing. States that she keeps "picking at it." It is tender.  No hx of trauma. Noted 2 months ago. She follows with dermatologist.  Review of Systems  Constitutional: Negative for activity change, appetite change and fever.  HENT: Negative for mouth sores, nosebleeds and sore throat.   Respiratory: Negative for cough and wheezing.   Gastrointestinal: Negative for blood in stool.       Negative for changes in bowel habits.  Genitourinary: Negative for decreased urine volume, dysuria and hematuria.  Musculoskeletal: Positive for arthralgias and back pain.  Neurological: Negative for syncope, facial asymmetry and weakness.  Psychiatric/Behavioral: Positive for sleep disturbance. Negative for confusion. The patient is nervous/anxious.   Rest of ROS, see pertinent positives sand negatives in HPI  Current Outpatient Medications on File Prior to Visit  Medication Sig Dispense Refill  . ACCU-CHEK SOFTCLIX LANCETS lancets Use to test blood sugar 3 times daily. Dx: E11.8 100 each 12  . albuterol (VENTOLIN HFA) 108 (90 Base) MCG/ACT inhaler INHALE 2 PUFFS INTO THE LUNGS EVERY 6 HOURS AS NEEDED FOR WHEEZING OR SHORTNESS OF BREATH 54 g 0  . Alcohol Swabs (B-D SINGLE USE SWABS REGULAR) PADS Use to test blood sugar 3 times daily 100 each 3  . Blood Glucose Calibration (ACCU-CHEK AVIVA) SOLN Use  as directed 1 each 2  . Blood Glucose Monitoring Suppl (ACCU-CHEK AVIVA PLUS) w/Device KIT Use to check blood sugar 3 times daily 1 kit 1  . clonazePAM (KLONOPIN) 1 MG tablet Take 1 tablet (1 mg total) by mouth 2 (two) times daily as needed for anxiety. 60 tablet 3  . Dulaglutide (TRULICITY) 1.5 QM/0.8QP SOPN Inject 1.5 mg into the skin once a week. 13 mL 3  . fluticasone (FLONASE) 50 MCG/ACT nasal spray     . gabapentin (NEURONTIN) 400 MG  capsule TAKE 1 CAPSULE(400 MG) BY MOUTH THREE TIMES DAILY 90 capsule 3  . glucose blood (ACCU-CHEK AVIVA) test strip Use to test blood sugar 3 times daily. Dx: 11.8 100 each 12  . hydrochlorothiazide (HYDRODIURIL) 25 MG tablet Take 1 tablet (25 mg total) by mouth daily. 90 tablet 3  . HYDROcodone-acetaminophen (NORCO) 7.5-325 MG tablet Take 1 tablet by mouth every 6 (six) hours as needed for moderate pain. 120 tablet 0  . ipratropium (ATROVENT) 0.06 % nasal spray Place 2 sprays into both nostrils 4 (four) times daily. (Patient taking differently: Place 2 sprays into both nostrils 4 (four) times daily as needed (seasonal allergies.).) 15 mL 3  . potassium chloride (KLOR-CON) 10 MEQ tablet TAKE 1 TABLET BY MOUTH EVERY DAY (Patient taking differently: Take 10 mEq by mouth daily.) 90 tablet 2  . triamcinolone (KENALOG) 0.1 % paste Use as directed 1 application in the mouth or throat 2 (two) times daily. (Patient taking differently: Use as directed 1 application in the mouth or throat 2 (two) times daily as needed (irritation.).) 5 g 1   No current facility-administered medications on file prior to visit.   Past Medical History:  Diagnosis Date  . 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 Anxiety   . Arthritis   . Diabetes mellitus without complication ([place])   . GERD (gastroesophageal reflux disease)   . Heart murmur   . Hyperlipidemia   . Hypertension   . Neuromuscular disorder ([place])   . Palpitations   . Pneumonia   . PONV (postoperative nausea and vomiting)   . PTSD (post-traumatic stress disorder)   . Ringing of ears, left    Allergies  Allergen Reactions  . Hydromorphone Nausea And Vomiting and Other (See Comments)    Made the patient feel "spaced out," also Made the patient feel "spaced out," also  . Morphine And Related Itching    With high doses  . Penicillins Rash    Has patient had a PCN reaction causing immediate rash, facial/tongue/throat swelling, SOB or lightheadedness with hypotension:  Yes Has patient had a PCN reaction causing severe rash involving mucus membranes or skin necrosis: No Has patient had a PCN reaction that required hospitalization: No Has patient had a PCN reaction occurring within the last 10 years: No If all of the above answers are "NO", then may proceed with Cephalosporin use.     Social History   Socioeconomic History  . Marital status: Divorced    Spouse name: Not on file  . Number of children: Not on file  . Years of education: Not on file  . Highest education level: Not on file  Occupational History  . Not on file  Tobacco Use  . Smoking status: Never Smoker  . Smokeless tobacco: Never Used  Vaping Use  . Vaping Use: Never used  Substance and Sexual Activity  . Alcohol use: No  . Drug use: No  . Sexual activity: Not on file  Other  Topics Concern  . Not on file  Social History Narrative  . Not on file   Social Determinants of Health   Financial Resource Strain: Low Risk   . Difficulty of Paying Living Expenses: Not hard at all  Food Insecurity: Not on file  Transportation Needs: No Transportation Needs  . Lack of Transportation (Medical): No  . Lack of Transportation (Non-Medical): No  Physical Activity: Not on file  Stress: Not on file  Social Connections: Not on file    Vitals:   05/18/20 1422  BP: 128/70  Pulse: 84  Resp: 16  SpO2: 97%   Body mass index is 28.6 kg/m.  Physical Exam Vitals and nursing note reviewed.  Constitutional:      General: She is not in acute distress.    Appearance: She is well-developed.  HENT:     Head: Normocephalic and atraumatic.     Mouth/Throat:     Mouth: Oropharynx is clear and moist and mucous membranes are normal. Mucous membranes are moist.     Pharynx: Oropharynx is clear.  Eyes:     Conjunctiva/sclera: Conjunctivae normal.  Cardiovascular:     Rate and Rhythm: Normal rate and regular rhythm.     Pulses:          Dorsalis pedis pulses are 2+ on the right side and 2+  on the left side.     Heart sounds: No murmur heard.   Pulmonary:     Effort: Pulmonary effort is normal. No respiratory distress.     Breath sounds: Normal breath sounds.  Abdominal:     Palpations: Abdomen is soft. There is no hepatomegaly or mass.     Tenderness: There is no abdominal tenderness.  Musculoskeletal:        General: No edema.  Lymphadenopathy:     Cervical: No cervical adenopathy.  Skin:    General: Skin is warm.     Findings: Lesion present. No rash.       Neurological:     General: No focal deficit present.     Mental Status: She is alert and oriented to person, place, and time.     Cranial Nerves: No cranial nerve deficit.     Deep Tendon Reflexes: Strength normal.     Comments: Antalgic gait,otherwsie stable , not assisted.  Psychiatric:        Mood and Affect: Mood is anxious.     Comments: Well groomed, good eye contact.   ASSESSMENT AND PLAN:  [name] was seen today for 6 months follow-up.  Orders Placed This Encounter  Procedures  . Comprehensive metabolic panel  . POC HgB A1c   Lab Results  Component Value Date   HGBA1C 6.8 05/18/2020   Lab Results  Component Value Date   CREATININE 0.86 05/18/2020   BUN 14 05/18/2020   NA 139 05/18/2020   K 3.8 05/18/2020   CL 103 05/18/2020   CO2 25 05/18/2020   Lab Results  Component Value Date   ALT 16 05/18/2020   AST 18 05/18/2020   ALKPHOS 61 04/04/2019   BILITOT 0.7 05/18/2020   Skin lesion of hand ? AK,superficialexcoriation. Recommend topical abx x 7 days, Mupirocin oint bid. Avoid pulling out crust and let it heal. If not healed in a couple week, she may need to see dermatologics for a Bx.  GERD Some of the GI symptoms could be related to this problem. Recommend taking Omeprazole 40 mg at night, before supper,once daily. GERD  precautions also recommended.  Type 2 diabetes mellitus with neurological complications (HCC) CVK1M at goal. Continue Glipizide 5 mg daily  and Trulicity 1.5 mg weekly.  Annual eye exam, periodic dental and foot care. F/U in 5-6 months   Essential hypertension BP adequately controlled. Continue HCTZ 25 mg daily and low-salt diet. Continue monitoring BP regularly.  HYPOKALEMIA Continue K-Lor 10 mEq daily. We had discussed HCTZ side effects in the past. Further recommendation will be given according to BMP results.  Polyneuropathy associated with underlying disease ([place]) Stable. Continue Gabapentin 400 mg tid. Adequate foot care to continue. Following with neurologist.  Insomnia Problem is well controlled. Continue Seroquel 100 mg 1/2 tab at bedtime. Good sleep hygiene.  Anxiety disorder, unspecified Problem has improved with meditation. Continue clonazepam 1 mg twice daily as needed. She understands the risk of interaction with opioid medication.   Atherosclerosis of aorta ([place]) She has not tolerated statin medications. Continue low fat diet.  Depression, major, recurrent, in partial remission ([place]) Problem is stable. Continue Seroquel 50 mg daily.   Spent 41 minutes with pt.  During this time history was obtained and documented, examination was performed, prior labs reviewed, and assessment/plan discussed.   Return in about 5 months (around 10/15/2020) for DM II,anxiety,insomnia, HTN.   Wania Longstreth G. Martinique, MD  [facility]. [place] office.   A few things to remember from today's visit:   If you need refills please call your pharmacy. Do not use My Chart to request refills or for acute issues that need immediate attention. No changes today. Mupirocin ointment on left hand, do not pick on it and if not resolved in a couple week you need to see your dermatologist.   Please be sure medication list is accurate. If a new problem present, please set up appointment sooner than planned today.

## 2020-05-18 NOTE — Assessment & Plan Note (Signed)
Problem is well controlled. Continue Seroquel 100 mg 1/2 tab at bedtime. Good sleep hygiene.

## 2020-05-19 LAB — COMPREHENSIVE METABOLIC PANEL
AG Ratio: 1.6 (calc) (ref 1.0–2.5)
ALT: 16 U/L (ref 6–29)
AST: 18 U/L (ref 10–35)
Albumin: 4.5 g/dL (ref 3.6–5.1)
Alkaline phosphatase (APISO): 69 U/L (ref 37–153)
BUN: 14 mg/dL (ref 7–25)
CO2: 25 mmol/L (ref 20–32)
Calcium: 9.6 mg/dL (ref 8.6–10.4)
Chloride: 103 mmol/L (ref 98–110)
Creat: 0.86 mg/dL (ref 0.50–0.99)
Globulin: 2.9 g/dL (calc) (ref 1.9–3.7)
Glucose, Bld: 115 mg/dL — ABNORMAL HIGH (ref 65–99)
Potassium: 3.8 mmol/L (ref 3.5–5.3)
Sodium: 139 mmol/L (ref 135–146)
Total Bilirubin: 0.7 mg/dL (ref 0.2–1.2)
Total Protein: 7.4 g/dL (ref 6.1–8.1)

## 2020-05-28 ENCOUNTER — Ambulatory Visit: Payer: Medicare Other | Admitting: Orthopedic Surgery

## 2020-06-05 ENCOUNTER — Other Ambulatory Visit: Payer: Self-pay

## 2020-06-05 ENCOUNTER — Encounter: Payer: Self-pay | Admitting: Registered Nurse

## 2020-06-05 ENCOUNTER — Encounter: Payer: Medicare Other | Attending: Physical Medicine & Rehabilitation | Admitting: Registered Nurse

## 2020-06-05 VITALS — BP 112/77 | HR 89 | Temp 98.2°F | Ht 68.0 in | Wt 189.0 lb

## 2020-06-05 DIAGNOSIS — M47812 Spondylosis without myelopathy or radiculopathy, cervical region: Secondary | ICD-10-CM | POA: Diagnosis not present

## 2020-06-05 DIAGNOSIS — M5412 Radiculopathy, cervical region: Secondary | ICD-10-CM | POA: Diagnosis not present

## 2020-06-05 DIAGNOSIS — Z79891 Long term (current) use of opiate analgesic: Secondary | ICD-10-CM

## 2020-06-05 DIAGNOSIS — G894 Chronic pain syndrome: Secondary | ICD-10-CM | POA: Diagnosis not present

## 2020-06-05 DIAGNOSIS — M5416 Radiculopathy, lumbar region: Secondary | ICD-10-CM

## 2020-06-05 DIAGNOSIS — M542 Cervicalgia: Secondary | ICD-10-CM | POA: Diagnosis not present

## 2020-06-05 DIAGNOSIS — Z5181 Encounter for therapeutic drug level monitoring: Secondary | ICD-10-CM

## 2020-06-05 MED ORDER — HYDROCODONE-ACETAMINOPHEN 7.5-325 MG PO TABS: 1.0000 | ORAL_TABLET | Freq: Four times a day (QID) | ORAL | 0 refills | Status: DC | PRN

## 2020-06-05 NOTE — Progress Notes (Signed)
Subjective:    Patient ID: [name], female    DOB: 1950-08-26, 70 y.o.   MRN: [id number]  HPI: [name] is a 70 y.o. female who returns for follow up appointment for chronic pain and medication refill. She states her pain is located in her neck radiating into her bilateral shoulders and lower back radiating into her left lower extremity. She rates her pain 8. Her current exercise regime is walking and performing stretching exercises.   [name] equivalent is 30.00 MME. She is also prescribed Clonazepam by Dr. Martinique. We have discussed the black box warning of using opioids and benzodiazepines. I highlighted the dangers of using these drugs together and discussed the adverse events including respiratory suppression, overdose, cognitive impairment and importance of compliance with current regimen. We will continue to monitor and adjust as indicated.    Last Oral Swab was Performed on 02/29/2020, it was consistent.   Pain Inventory Average Pain 8 Pain Right Now 8 My pain is sharp, burning and aching  In the last 24 hours, has pain interfered with the following? General activity 5 Relation with others 7 Enjoyment of life 6 What TIME of day is your pain at its worst? morning  and night Sleep (in general) Fair  Pain is worse with: bending, sitting, inactivity, standing and some activites Pain improves with: rest, heat/ice, therapy/exercise, pacing activities and medication Relief from Meds: 9  Family History  Problem Relation Age of Onset  . Diabetes Mother   . Alzheimer's disease Mother   . Lung cancer Father   . Esophageal cancer Brother   . Colon cancer Neg Hx    Social History   Socioeconomic History  . Marital status: Divorced    Spouse name: Not on file  . Number of children: Not on file  . Years of education: Not on file  . Highest education level: Not on file  Occupational History  . Not on file  Tobacco Use  . Smoking status: Never  Smoker  . Smokeless tobacco: Never Used  Vaping Use  . Vaping Use: Never used  Substance and Sexual Activity  . Alcohol use: No  . Drug use: No  . Sexual activity: Not on file  Other Topics Concern  . Not on file  Social History Narrative  . Not on file   Social Determinants of Health   Financial Resource Strain: Low Risk   . Difficulty of Paying Living Expenses: Not hard at all  Food Insecurity: Not on file  Transportation Needs: No Transportation Needs  . Lack of Transportation (Medical): No  . Lack of Transportation (Non-Medical): No  Physical Activity: Not on file  Stress: Not on file  Social Connections: Not on file   Past Surgical History:  Procedure Laterality Date  . ABDOMINAL HYSTERECTOMY     partial  . ACHILLES TENDON SURGERY Left   . APPENDECTOMY    . BACK SURGERY    . BLADDER SUSPENSION    . BREAST SURGERY Right 2003   milk duct removed  . CHOLECYSTECTOMY    . FRACTURE SURGERY     collar bone  . NOSE SURGERY     x 2   . OTHER SURGICAL HISTORY     Breast Duct removed  . PARTIAL HYSTERECTOMY    . RECTOCELE REPAIR    . TONSILLECTOMY    . VASCULAR SURGERY Right    right leg  . VEIN SURGERY Right    leg   Past  Surgical History:  Procedure Laterality Date  . ABDOMINAL HYSTERECTOMY     partial  . ACHILLES TENDON SURGERY Left   . APPENDECTOMY    . BACK SURGERY    . BLADDER SUSPENSION    . BREAST SURGERY Right 2003   milk duct removed  . CHOLECYSTECTOMY    . FRACTURE SURGERY     collar bone  . NOSE SURGERY     x 2   . OTHER SURGICAL HISTORY     Breast Duct removed  . PARTIAL HYSTERECTOMY    . RECTOCELE REPAIR    . TONSILLECTOMY    . VASCULAR SURGERY Right    right leg  . VEIN SURGERY Right    leg   Past Medical History:  Diagnosis Date  . 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 Anxiety   . Arthritis   . Diabetes mellitus without complication ([place])   . GERD (gastroesophageal reflux disease)   . Heart murmur   . Hyperlipidemia   . Hypertension    . Neuromuscular disorder ([place])   . Palpitations   . Pneumonia   . PONV (postoperative nausea and vomiting)   . PTSD (post-traumatic stress disorder)   . Ringing of ears, left    BP 112/77   Pulse 89   Temp 98.2 F (36.8 C)   Ht 5\' 8"  (1.727 m)   Wt 189 lb (85.7 kg)   SpO2 96%   BMI 28.74 kg/m   Opioid Risk Score:   Fall Risk Score:  `1  Depression screen PHQ 2/9  Depression screen [facility] 2/9 04/10/2020 12/30/2019 12/29/2019 11/04/2019 08/25/2019 04/07/2019 09/10/2018  Decreased Interest 1 1 2 2 2  0 1  Down, Depressed, Hopeless 1 1 2 2 2  0 1  PHQ - 2 Score 2 2 4 4 4  0 2  Altered sleeping - - 3 - 3 1 -  Tired, decreased energy - - 2 - 2 1 -  Change in appetite - - 2 - 2 1 -  Feeling bad or failure about yourself  - - 1 - 2 0 -  Trouble concentrating - - 0 - 2 0 -  Moving slowly or fidgety/restless - - 0 - 2 0 -  Suicidal thoughts - - 0 - 0 0 -  PHQ-9 Score - - 12 - 17 3 -  Difficult doing work/chores - - Somewhat difficult - Very difficult Somewhat difficult -  Some recent data might be hidden    Review of Systems  Constitutional: Negative.   HENT: Negative.   Eyes: Negative.   Respiratory: Negative.   Cardiovascular: Negative.   Gastrointestinal: Negative.   Endocrine: Negative.   Genitourinary: Negative.   Musculoskeletal: Positive for arthralgias, back pain, neck pain and neck stiffness.  Skin: Negative.   Allergic/Immunologic: Negative.   Neurological: Negative.   Hematological: Negative.   Psychiatric/Behavioral: Negative.   All other systems reviewed and are negative.      Objective:   Physical Exam Vitals and nursing note reviewed.  Constitutional:      Appearance: Normal appearance.  Neck:     Comments: Cervical Paraspinal Tenderness: C-5-C-6 Cardiovascular:     Rate and Rhythm: Normal rate and regular rhythm.     Pulses: Normal pulses.     Heart sounds: Normal heart sounds.  Pulmonary:     Effort: Pulmonary effort is normal.     Breath sounds:  Normal breath sounds.  Musculoskeletal:     Cervical back: Normal range of motion and neck  supple.     Comments: Normal Muscle Bulk and Muscle Testing Reveals: Upper Extremities: Full ROM and Muscle Strength 5/5 Bilateral AC Joint Tenderness Lumbar Paraspinal Tenderness: L-3- L-5 Lower Extremities: Full ROM and Muscle Strength 5/5 Left Lower Extremity Flexion Produces Pain into left lower extremity. Arises from Table with ease Narrow Based  Gait   Skin:    General: Skin is warm and dry.  Neurological:     Mental Status: She is alert and oriented to person, place, and time.  Psychiatric:        Mood and Affect: Mood normal.        Behavior: Behavior normal.           Assessment & Plan:  1.Chronic cervicalgia with documented spondylosis on MRI, per Dr. Naaman Plummer Note and Cervical Radiculitis:ContinueGabapentin, Continue HEP as Tolerated.06/05/2020 2. Left shoulder pain most consistent with left rotator cuff syndrome. Mild DJD.  Continue HEP as tolerated.Continue to monitor.06/05/2020. Refilled:Hydrocodone7.5/325 mg one tablet every6hours as needed for pain. #120.Second script sent for the following month. We will continue the opioid monitoring program, this consists of regular clinic visits, examinations, urine drug screen, pill counts as well as use of [place] Controlled Substance Reporting system. A 12 month History has been reviewed on the [place] Controlled Substance Reporting Systemon03/04/2020. 3. Left wrist/finger pain most consistent with OA, ?post traumatic. No complaints today.06/05/2020 4. Anxiety: Continue Klonopin, PCP prescribing. Continue to monitor.06/05/2020 5. Restless Leg syndrome: Continue Gabapentin.06/05/2020 7. Sacroiliac inflammation: S/PRightSI injection on 09/20/2019, with No relief noted. [name] seen Dr Marlou Sa in regards to her pain. Dr Saintclair Halsted. Following.  Continue to Monitor.06/05/2020. 8. Myofascial Pain: Continue Robaxin as  needed.06/05/2020. 9. Bilateral feet with neuropathic pain:Continue Gabapentin. Continue to monitor.06/05/2020. 10.Herniated Nucleus Pulposus L2-3 with recurrent disc rupture: S/P Posterior Lumbar Interbody Fusion - Lumbar two-Lumbar threeon 01/13/2020 by Dr Saintclair Halsted. Dr Saintclair Halsted Following.   F/U in69months

## 2020-06-06 ENCOUNTER — Ambulatory Visit: Payer: Medicare Other | Admitting: Physical Medicine & Rehabilitation

## 2020-06-11 DIAGNOSIS — D0462 Carcinoma in situ of skin of left upper limb, including shoulder: Secondary | ICD-10-CM | POA: Diagnosis not present

## 2020-06-11 DIAGNOSIS — C44621 Squamous cell carcinoma of skin of unspecified upper limb, including shoulder: Secondary | ICD-10-CM | POA: Diagnosis not present

## 2020-06-12 ENCOUNTER — Other Ambulatory Visit: Payer: Self-pay | Admitting: Registered Nurse

## 2020-06-12 ENCOUNTER — Other Ambulatory Visit: Payer: Self-pay | Admitting: Student

## 2020-06-12 DIAGNOSIS — G2581 Restless legs syndrome: Secondary | ICD-10-CM

## 2020-06-12 DIAGNOSIS — M5440 Lumbago with sciatica, unspecified side: Secondary | ICD-10-CM

## 2020-06-28 DIAGNOSIS — C44629 Squamous cell carcinoma of skin of left upper limb, including shoulder: Secondary | ICD-10-CM | POA: Diagnosis not present

## 2020-06-30 ENCOUNTER — Other Ambulatory Visit: Payer: Self-pay | Admitting: Family Medicine

## 2020-07-01 ENCOUNTER — Other Ambulatory Visit: Payer: Self-pay

## 2020-07-01 ENCOUNTER — Encounter (HOSPITAL_COMMUNITY): Payer: Self-pay

## 2020-07-01 ENCOUNTER — Emergency Department (HOSPITAL_COMMUNITY): Payer: Medicare Other

## 2020-07-01 ENCOUNTER — Ambulatory Visit
Admission: RE | Admit: 2020-07-01 | Discharge: 2020-07-01 | Disposition: A | Discharge status: Home / Self Care | Payer: Medicare Other | Source: Ambulatory Visit | Attending: Student | Admitting: Student

## 2020-07-01 ENCOUNTER — Emergency Department (HOSPITAL_COMMUNITY)
Admission: EM | Admit: 2020-07-01 | Discharge: 2020-07-02 | Disposition: A | Discharge status: Home / Self Care | Payer: Medicare Other | Attending: Emergency Medicine | Admitting: Emergency Medicine

## 2020-07-01 DIAGNOSIS — Z7984 Long term (current) use of oral hypoglycemic drugs: Secondary | ICD-10-CM | POA: Diagnosis not present

## 2020-07-01 DIAGNOSIS — S0093XA Contusion of unspecified part of head, initial encounter: Secondary | ICD-10-CM | POA: Diagnosis not present

## 2020-07-01 DIAGNOSIS — Z79899 Other long term (current) drug therapy: Secondary | ICD-10-CM | POA: Diagnosis not present

## 2020-07-01 DIAGNOSIS — M79642 Pain in left hand: Secondary | ICD-10-CM | POA: Diagnosis not present

## 2020-07-01 DIAGNOSIS — R519 Headache, unspecified: Secondary | ICD-10-CM | POA: Diagnosis not present

## 2020-07-01 DIAGNOSIS — S7002XA Contusion of left hip, initial encounter: Secondary | ICD-10-CM | POA: Insufficient documentation

## 2020-07-01 DIAGNOSIS — Z043 Encounter for examination and observation following other accident: Secondary | ICD-10-CM | POA: Diagnosis not present

## 2020-07-01 DIAGNOSIS — M25519 Pain in unspecified shoulder: Secondary | ICD-10-CM | POA: Diagnosis not present

## 2020-07-01 DIAGNOSIS — M48061 Spinal stenosis, lumbar region without neurogenic claudication: Secondary | ICD-10-CM | POA: Diagnosis not present

## 2020-07-01 DIAGNOSIS — S60012A Contusion of left thumb without damage to nail, initial encounter: Secondary | ICD-10-CM | POA: Insufficient documentation

## 2020-07-01 DIAGNOSIS — W01198A Fall on same level from slipping, tripping and stumbling with subsequent striking against other object, initial encounter: Secondary | ICD-10-CM | POA: Insufficient documentation

## 2020-07-01 DIAGNOSIS — E119 Type 2 diabetes mellitus without complications: Secondary | ICD-10-CM | POA: Insufficient documentation

## 2020-07-01 DIAGNOSIS — I10 Essential (primary) hypertension: Secondary | ICD-10-CM | POA: Diagnosis not present

## 2020-07-01 DIAGNOSIS — R220 Localized swelling, mass and lump, head: Secondary | ICD-10-CM | POA: Diagnosis not present

## 2020-07-01 DIAGNOSIS — Y9301 Activity, walking, marching and hiking: Secondary | ICD-10-CM | POA: Diagnosis not present

## 2020-07-01 DIAGNOSIS — S40022A Contusion of left upper arm, initial encounter: Secondary | ICD-10-CM | POA: Insufficient documentation

## 2020-07-01 DIAGNOSIS — Y92000 Kitchen of unspecified non-institutional (private) residence as  the place of occurrence of the external cause: Secondary | ICD-10-CM | POA: Diagnosis not present

## 2020-07-01 DIAGNOSIS — S40012A Contusion of left shoulder, initial encounter: Secondary | ICD-10-CM | POA: Insufficient documentation

## 2020-07-01 DIAGNOSIS — M542 Cervicalgia: Secondary | ICD-10-CM | POA: Diagnosis not present

## 2020-07-01 DIAGNOSIS — S0083XA Contusion of other part of head, initial encounter: Secondary | ICD-10-CM | POA: Diagnosis not present

## 2020-07-01 DIAGNOSIS — M5117 Intervertebral disc disorders with radiculopathy, lumbosacral region: Secondary | ICD-10-CM | POA: Diagnosis not present

## 2020-07-01 DIAGNOSIS — T07XXXA Unspecified multiple injuries, initial encounter: Secondary | ICD-10-CM

## 2020-07-01 DIAGNOSIS — R200 Anesthesia of skin: Secondary | ICD-10-CM | POA: Diagnosis not present

## 2020-07-01 DIAGNOSIS — M79603 Pain in arm, unspecified: Secondary | ICD-10-CM | POA: Diagnosis not present

## 2020-07-01 DIAGNOSIS — M5440 Lumbago with sciatica, unspecified side: Secondary | ICD-10-CM

## 2020-07-01 DIAGNOSIS — M545 Low back pain, unspecified: Secondary | ICD-10-CM | POA: Diagnosis not present

## 2020-07-01 DIAGNOSIS — R531 Weakness: Secondary | ICD-10-CM | POA: Diagnosis not present

## 2020-07-01 DIAGNOSIS — R000 Tachycardia, unspecified: Secondary | ICD-10-CM | POA: Diagnosis not present

## 2020-07-01 DIAGNOSIS — S0990XA Unspecified injury of head, initial encounter: Secondary | ICD-10-CM | POA: Diagnosis not present

## 2020-07-01 MED ORDER — GADOBENATE DIMEGLUMINE 529 MG/ML IV SOLN
17.0000 mL | Freq: Once | INTRAVENOUS | Status: AC | PRN
  Administered 2020-07-01: 17 mL via INTRAVENOUS

## 2020-07-01 NOTE — ED Triage Notes (Addendum)
[facility] from home for a fall. Cc of left forehead, left shoulder, and left hip pain. Hx of spinal fusion cannot lay flat.  Alert and oriented.   deformity to left hip

## 2020-07-01 NOTE — ED Provider Notes (Signed)
[facility] EMERGENCY DEPARTMENT Provider Note   CSN: [id number] Arrival date & time: 07/01/20  2316     History Chief Complaint  Patient presents with  . Fall    [name] is a 70 y.o. female.  Patient presents to the emergency department for evaluation after a fall.  Patient reports that [name] turned too fast to answer her phone, slipped and fell.  [name] landed on her left side.  [name] is complaining of headache, left shoulder pain, left hand/thumb pain, left hip pain since the fall.  No loss of consciousness.        Past Medical History:  Diagnosis Date  . 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[place])   . Anxiety   . Arthritis   . Diabetes mellitus without complication ([place])   . GERD (gastroesophageal reflux disease)   . Heart murmur   . Hyperlipidemia   . Hypertension   . Neuromuscular disorder ([place])   . Palpitations   . Pneumonia   . PONV (postoperative nausea and vomiting)   . PTSD (post-traumatic stress disorder)   . Ringing of ears, left     Patient Active Problem List   Diagnosis Date Noted  . Anxiety disorder, unspecified 01/27/2020  . HNP (herniated nucleus pulposus), lumbar 01/13/2020  . PTSD (post-traumatic stress disorder)   . Polyneuropathy associated with underlying disease ([place]) 07/26/2019  . Allergic rhinitis 07/26/2019  . Atherosclerosis of aorta ([place]) 04/04/2019  . Myalgia due to statin 11/18/2017  . Depression, major, recurrent, in partial remission ([place]) 10/16/2017  . Insomnia 10/16/2017  . Chronic pain syndrome 09/07/2017  . Spinal stenosis of lumbar region 07/08/2017  . DOE (dyspnea on exertion) 12/16/2016  . Family history of early CAD 12/16/2016  . Other chest pain 12/16/2016  . Arthritis of left acromioclavicular joint 08/13/2016  . Left shoulder pain 05/12/2016  . Cervical spondylosis without myelopathy 05/12/2016  . Rotator cuff syndrome, left 05/12/2016  . Restless leg syndrome 05/12/2016  . Primary osteoarthritis, left hand 05/12/2016  .  MELENA 10/18/2008  . GASTROPARESIS 05/30/2008  . Type 2 diabetes mellitus with neurological complications ([place]) 03/30/4974  . HYPERCHOLESTEROLEMIA 05/04/2008  . HYPOKALEMIA 05/04/2008  . GERD 05/04/2008  . Constipation 05/04/2008  . HEMATEMESIS 05/04/2008  . Essential hypertension 05/04/2008    Past Surgical History:  Procedure Laterality Date  . ABDOMINAL HYSTERECTOMY     partial  . ACHILLES TENDON SURGERY Left   . APPENDECTOMY    . BACK SURGERY    . BLADDER SUSPENSION    . BREAST SURGERY Right 2003   milk duct removed  . CHOLECYSTECTOMY    . FRACTURE SURGERY     collar bone  . NOSE SURGERY     x 2   . OTHER SURGICAL HISTORY     Breast Duct removed  . PARTIAL HYSTERECTOMY    . RECTOCELE REPAIR    . TONSILLECTOMY    . VASCULAR SURGERY Right    right leg  . VEIN SURGERY Right    leg     OB History   No obstetric history on file.     Family History  Problem Relation Age of Onset  . Diabetes Mother   . Alzheimer's disease Mother   . Lung cancer Father   . Esophageal cancer Brother   . Colon cancer Neg Hx     Social History   Tobacco Use  . Smoking status: Never Smoker  . Smokeless tobacco: Never Used  Vaping Use  . Vaping Use: Never used  Substance Use Topics  . Alcohol use: No  . Drug use: No    Home Medications Prior to Admission medications   Medication Sig Start Date End Date Taking? Authorizing Provider  ACCU-CHEK SOFTCLIX LANCETS lancets Use to test blood sugar 3 times daily. Dx: E11.8 08/11/17   [place], Betty G, MD  albuterol (VENTOLIN HFA) 108 (90 Base) MCG/ACT inhaler INHALE 2 PUFFS INTO THE LUNGS EVERY 6 HOURS AS NEEDED FOR WHEEZING OR SHORTNESS OF BREATH 03/02/19   [place], Betty G, MD  Alcohol Swabs (B-D SINGLE USE SWABS REGULAR) PADS Use to test blood sugar 3 times daily 08/11/17   [place], Betty G, MD  Blood Glucose Calibration (ACCU-CHEK AVIVA) SOLN Use as directed 08/11/17   [place], Betty G, MD  Blood Glucose Monitoring Suppl (ACCU-CHEK AVIVA  PLUS) w/Device KIT Use to check blood sugar 3 times daily 07/22/18   [place], Betty G, MD  clonazePAM (KLONOPIN) 1 MG tablet Take 1 tablet (1 mg total) by mouth 2 (two) times daily as needed for anxiety. 04/27/20   [place], Betty G, MD  Dulaglutide (TRULICITY) 1.5 ZO/1.0RU SOPN Inject 1.5 mg into the skin once a week. 04/17/20   [place], Betty G, MD  fluticasone Asencion Islam) 50 MCG/ACT nasal spray  02/27/20   [provider]  gabapentin (NEURONTIN) 400 MG capsule TAKE 1 CAPSULE(400 MG) BY MOUTH THREE TIMES DAILY 06/12/20   Bayard Hugger, NP  glipiZIDE (GLUCOTROL) 5 MG tablet Take 1 tablet (5 mg total) by mouth daily before breakfast. 05/18/20   [place], Betty G, MD  glucose blood (ACCU-CHEK AVIVA) test strip Use to test blood sugar 3 times daily. Dx: 11.8 08/11/17   [place], Betty G, MD  hydrochlorothiazide (HYDRODIURIL) 25 MG tablet Take 1 tablet (25 mg total) by mouth daily. 05/30/19   [place], Betty G, MD  HYDROcodone-acetaminophen ([place]) 7.5-325 MG tablet Take 1 tablet by mouth every 6 (six) hours as needed for moderate pain. 06/05/20   Bayard Hugger, NP  ipratropium (ATROVENT) 0.06 % nasal spray Place 2 sprays into both nostrils 4 (four) times daily. Patient taking differently: Place 2 sprays into both nostrils 4 (four) times daily as needed (seasonal allergies.). 07/26/19   [place], Betty G, MD  omeprazole (PRILOSEC) 40 MG capsule Take 1 capsule (40 mg total) by mouth daily. 05/18/20   [place], Betty G, MD  potassium chloride (KLOR-CON) 10 MEQ tablet TAKE 1 TABLET BY MOUTH EVERY DAY Patient taking differently: Take 10 mEq by mouth daily. 10/05/19   [place], Betty G, MD  QUEtiapine (SEROQUEL) 100 MG tablet Take 0.5 tablets (50 mg total) by mouth at bedtime. 05/18/20   [place], Betty G, MD  triamcinolone (KENALOG) 0.1 % paste Use as directed 1 application in the mouth or throat 2 (two) times daily. Patient taking differently: Use as directed 1 application in the mouth or throat 2 (two) times daily as needed  (irritation.). 12/27/19   [place], Betty G, MD    Allergies    Hydromorphone, Morphine and related, and Penicillins  Review of Systems   Review of Systems  Musculoskeletal: Positive for arthralgias.  Neurological: Positive for headaches.  All other systems reviewed and are negative.   Physical Exam Updated Vital Signs BP (!) 135/95   Pulse 71   Temp 98 F (36.7 C)   Resp (!) 23   Ht 5' 8"  (1.727 m)   Wt 86 kg   SpO2 95%   BMI 28.83 kg/m   Physical Exam Vitals and nursing note reviewed.  Constitutional:      General: [name] is not in acute distress.    Appearance: Normal appearance. [name] is well-developed.  HENT:     Head: Normocephalic. Contusion present.      Right Ear: Hearing normal.     Left Ear: Hearing normal.     Nose: Nose normal.  Eyes:     Conjunctiva/sclera: Conjunctivae normal.     Pupils: Pupils are equal, round, and reactive to light.  Neck:   Cardiovascular:     Rate and Rhythm: Regular rhythm.     Heart sounds: S1 normal and S2 normal. No murmur heard. No friction rub. No gallop.   Pulmonary:     Effort: Pulmonary effort is normal. No respiratory distress.     Breath sounds: Normal breath sounds.  Chest:     Chest wall: No tenderness.  Abdominal:     General: Bowel sounds are normal.     Palpations: Abdomen is soft.     Tenderness: There is no abdominal tenderness. There is no guarding or rebound. Negative signs include Murphy's sign and McBurney's sign.     Hernia: No hernia is present.  Musculoskeletal:     Left shoulder: Tenderness present. No deformity. Decreased range of motion.     Left upper arm: Tenderness present.     Left elbow: Normal.     Left forearm: Normal.     Left wrist: Normal.     Left hand: Tenderness (thumb) present. No swelling, deformity or lacerations.     Cervical back: Normal range of motion and neck supple.     Left hip: Tenderness present. No deformity. Decreased range of motion.  Skin:    General: Skin is warm  and dry.     Findings: No rash.  Neurological:     Mental Status: [name] is alert and oriented to person, place, and time.     GCS: GCS eye subscore is 4. GCS verbal subscore is 5. GCS motor subscore is 6.     Cranial Nerves: No cranial nerve deficit.     Sensory: No sensory deficit.     Coordination: Coordination normal.  Psychiatric:        Speech: Speech normal.        Behavior: Behavior normal.        Thought Content: Thought content normal.     ED Results / Procedures / Treatments   Labs (all labs ordered are listed, but only abnormal results are displayed) Labs Reviewed  COMPREHENSIVE METABOLIC PANEL - Abnormal; Notable for the following components:      Result Value   Potassium 3.4 (*)    Glucose, Bld 182 (*)    All other components within normal limits  CBC WITH DIFFERENTIAL/PLATELET  MAGNESIUM  TROPONIN I (HIGH SENSITIVITY)  TROPONIN I (HIGH SENSITIVITY)    EKG EKG Interpretation  Date/Time:  Sunday July 01 2020 23:26:43 EDT Ventricular Rate:  85 PR Interval:    QRS Duration: 97 QT Interval:  394 QTC Calculation: 469 R Axis:   -35 Text Interpretation: Sinus rhythm Ventricular trigeminy Low voltage, precordial leads LVH with secondary repolarization abnormality Anterior Q waves, possibly due to LVH Confirmed by Orpah Greek [id number]) on 07/01/2020 11:38:06 PM   Radiology DG Lumbar Spine Complete  Result Date: 07/02/2020 CLINICAL DATA:  Recent fall with low back pain, initial encounter EXAM: LUMBAR SPINE - COMPLETE 4+ VIEW COMPARISON:  MRI from 07/01/2020 FINDINGS: Postsurgical changes are again noted at L2-3 with interbody fusion. Vertebral body height  is well maintained. Mild osteophytic changes are seen. No pars defects are noted. No anterolisthesis is seen. No soft tissue abnormality is noted. IMPRESSION: Postsurgical changes and degenerative changes in the lumbar spine. No acute abnormality noted. Electronically Signed   By: Inez Catalina M.D.   On:  07/02/2020 03:16   CT HEAD WO CONTRAST  Result Date: 07/02/2020 CLINICAL DATA:  Fall fall with head pain EXAM: CT HEAD WITHOUT CONTRAST CT CERVICAL SPINE WITHOUT CONTRAST TECHNIQUE: Multidetector CT imaging of the head and cervical spine was performed following the standard protocol without intravenous contrast. Multiplanar CT image reconstructions of the cervical spine were also generated. COMPARISON:  Head CT July 08, 2019 and MRI cervical spine Aug 29, 2015 FINDINGS: CT HEAD FINDINGS Brain: No evidence of acute large vascular territory infarction, hemorrhage, hydrocephalus, extra-axial collection or mass lesion/mass effect. Mild age related global parenchymal volume loss. Vascular: No hyperdense vessel. Atherosclerotic calcifications of the intracranial portions of the internal carotid and vertebral arteries. Skull: Hyperostosis frontalis. Negative for fracture or focal lesion. Sinuses/Orbits: Changes of prior functional endoscopic sinus surgery. Partial opacification right frontal sinus with inspissated secretions in the left sphenoid sinus. Orbits are grossly unremarkable. Other: Soft tissue swelling and subcutaneous edema overlying the left forehead. CT CERVICAL SPINE FINDINGS Alignment: Normal. Skull base and vertebrae: No acute fracture. No primary bone lesion or focal pathologic process. Soft tissues and spinal canal: No prevertebral fluid or swelling. No visible canal hematoma. Disc levels: Multilevel disc space narrowing with facet/uncovertebral as well as ligamentum flavum hypertrophy, similar to MRI from 2017. Upper chest: Biapical pleuroparenchymal scarring. Other: Carotid artery calcifications. IMPRESSION: 1. No acute intracranial abnormality. 2. Soft tissue swelling and subcutaneous edema overlying the left frontal bone. 3. No evidence of acute fracture or subluxation of the cervical spine. 4. Similar multilevel degenerative degenerative changes cervical spine. Electronically Signed   By: Dahlia Bailiff MD   On: 07/02/2020 00:47   CT CERVICAL SPINE WO CONTRAST  Result Date: 07/02/2020 CLINICAL DATA:  Fall fall with head pain EXAM: CT HEAD WITHOUT CONTRAST CT CERVICAL SPINE WITHOUT CONTRAST TECHNIQUE: Multidetector CT imaging of the head and cervical spine was performed following the standard protocol without intravenous contrast. Multiplanar CT image reconstructions of the cervical spine were also generated. COMPARISON:  Head CT July 08, 2019 and MRI cervical spine Aug 29, 2015 FINDINGS: CT HEAD FINDINGS Brain: No evidence of acute large vascular territory infarction, hemorrhage, hydrocephalus, extra-axial collection or mass lesion/mass effect. Mild age related global parenchymal volume loss. Vascular: No hyperdense vessel. Atherosclerotic calcifications of the intracranial portions of the internal carotid and vertebral arteries. Skull: Hyperostosis frontalis. Negative for fracture or focal lesion. Sinuses/Orbits: Changes of prior functional endoscopic sinus surgery. Partial opacification right frontal sinus with inspissated secretions in the left sphenoid sinus. Orbits are grossly unremarkable. Other: Soft tissue swelling and subcutaneous edema overlying the left forehead. CT CERVICAL SPINE FINDINGS Alignment: Normal. Skull base and vertebrae: No acute fracture. No primary bone lesion or focal pathologic process. Soft tissues and spinal canal: No prevertebral fluid or swelling. No visible canal hematoma. Disc levels: Multilevel disc space narrowing with facet/uncovertebral as well as ligamentum flavum hypertrophy, similar to MRI from 2017. Upper chest: Biapical pleuroparenchymal scarring. Other: Carotid artery calcifications. IMPRESSION: 1. No acute intracranial abnormality. 2. Soft tissue swelling and subcutaneous edema overlying the left frontal bone. 3. No evidence of acute fracture or subluxation of the cervical spine. 4. Similar multilevel degenerative degenerative changes cervical spine.  Electronically Signed   By:  Dahlia Bailiff MD   On: 07/02/2020 00:47   MR Lumbar Spine W Wo Contrast  Result Date: 07/02/2020 CLINICAL DATA:  Low back pain with sciatica radiating to both legs, left-greater-than-right. Weakness and numbness in the left leg. EXAM: MRI LUMBAR SPINE WITHOUT AND WITH CONTRAST TECHNIQUE: Multiplanar and multiecho pulse sequences of the lumbar spine were obtained without and with intravenous contrast. CONTRAST:  29m MULTIHANCE GADOBENATE DIMEGLUMINE 529 MG/ML IV SOLN COMPARISON:  12/25/2019 FINDINGS: Segmentation:  Standard Alignment:  Normal Vertebrae:  L2-3 PLIF.  No acute fracture. Conus medullaris and cauda equina: Conus extends to the L1 level. Conus and cauda equina appear normal. Paraspinal and other soft tissues: Postoperative changes in the dorsal soft tissues at L2-3. Disc levels: T12-L1: Normal. L1-2: Unchanged small disc bulge without stenosis. L2-3: Status post PLIF. Widely patent thecal sac. No neural impingement. L3-4: Slightly worsened intermediate sized disc bulge. Left-greater-than-right lateral recess narrowing without central spinal canal stenosis. No neural impingement. L4-5: Remote postsurgical change. Intermediate sized disc bulge with superimposed central protrusion has progressed. Severe spinal canal stenosis. Mild bilateral foraminal stenosis. Moderate facet hypertrophy. L5-S1: Unchanged small left subarticular disc extrusion with inferior migration in close proximity to the left S1 nerve root. Mild left foraminal stenosis. No central spinal canal stenosis. IMPRESSION: 1. Status post L2-3 PLIF with widely patent spinal canal and no neural impingement. 2. Progression of L4-5 degenerative disc disease with severe spinal canal stenosis and mild bilateral foraminal stenosis. 3. Unchanged small left subarticular disc extrusion with inferior migration at L5-S1 in close proximity to the left S1 nerve root. 4. Slightly worsened L3-4 disc bulge with  left-greater-than-right lateral recess stenosis but no neural impingement. Electronically Signed   By: KUlyses JarredM.D.   On: 07/02/2020 01:07   DG Shoulder Left  Result Date: 07/02/2020 CLINICAL DATA:  Fall EXAM: LEFT SHOULDER - 2+ VIEW COMPARISON:  07/14/2016 FINDINGS: There is no evidence of fracture or dislocation. There is no evidence of arthropathy or other focal bone abnormality. Soft tissues are unremarkable. IMPRESSION: Negative. Electronically Signed   By: KUlyses JarredM.D.   On: 07/02/2020 01:01   DG Humerus Left  Result Date: 07/02/2020 CLINICAL DATA:  Fall EXAM: LEFT HUMERUS - 2+ VIEW COMPARISON:  None. FINDINGS: There is no evidence of fracture or other focal bone lesions. Soft tissues are unremarkable. IMPRESSION: Negative. Electronically Signed   By: KUlyses JarredM.D.   On: 07/02/2020 01:07   DG Hand Complete Left  Result Date: 07/02/2020 CLINICAL DATA:  Recent fall with hand pain, initial encounter EXAM: LEFT HAND - COMPLETE 3+ VIEW COMPARISON:  None. FINDINGS: There is no evidence of fracture or dislocation. Mild degenerative changes are noted in the interphalangeal joints as well as within the carpal bones. No gross soft tissue abnormality is noted. IMPRESSION: Degenerative change without acute abnormality. Electronically Signed   By: MInez CatalinaM.D.   On: 07/02/2020 03:15   DG Hip Unilat W or Wo Pelvis 2-3 Views Left  Result Date: 07/02/2020 CLINICAL DATA:  Fall EXAM: DG HIP (WITH OR WITHOUT PELVIS) 2-3V LEFT COMPARISON:  10/20/2015 FINDINGS: There is no evidence of hip fracture or dislocation. There is no evidence of arthropathy or other focal bone abnormality. IMPRESSION: Negative. Electronically Signed   By: KUlyses JarredM.D.   On: 07/02/2020 01:08    Procedures Procedures   Medications Ordered in ED Medications  fentaNYL (SUBLIMAZE) injection 50 mcg (50 mcg Intravenous Given 07/02/20 0154)    ED Course  I  have reviewed the triage vital signs and the nursing  notes.  Pertinent labs & imaging results that were available during my care of the patient were reviewed by me and considered in my medical decision making (see chart for details).    MDM Rules/Calculators/A&P                          Patient presents to the emergency department after a fall.  Patient reports that [name] turned while walking across her kitchen and [name] slipped because [name] was not wearing shoes.  [name] fell, hitting the left side of her body on the ground.  [name] has a contusion to the left forehead but did not lose consciousness.  Patient complaining of pain on the left neck area, left shoulder and humerus area, left hip area.  Patient reports that [name] recently had lumbar surgery and was told that [name] has additional herniated disks.  [name] does not have any weakness of lower extremities, no loss of sensation, no saddle anesthesia or foot drop.  X-ray of lumbar spine is unchanged from prior.  Patient does not have any acute abnormalities noted on left hip x-ray, left shoulder x-ray, left humerus x-ray, left hand x-ray.  Additionally, CT head and cervical spine were unremarkable.  Patient reportedly had an episode of not responding to x-ray tech while [name] was getting x-rays.  It is unclear what occurred, but at times staff responded to the exam room, patient was awake and alert.  There was no seizure-like activity.  Patient not experiencing any chest pain associated shortness of breath.  Symptoms felt to be secondary to her pain.  [name] did have some PVCs noted at time of EKG.  [name] has been continuously monitored and there has not been any arrhythmia.  Troponins negative.   Final Clinical Impression(s) / ED Diagnoses Final diagnoses:  Multiple contusions    Rx / DC Orders ED Discharge Orders    None       Caroline Longie, Gwenyth Allegra, MD 07/02/20 [phone]

## 2020-07-02 ENCOUNTER — Emergency Department (HOSPITAL_COMMUNITY): Payer: Medicare Other

## 2020-07-02 DIAGNOSIS — Z043 Encounter for examination and observation following other accident: Secondary | ICD-10-CM | POA: Diagnosis not present

## 2020-07-02 DIAGNOSIS — M545 Low back pain, unspecified: Secondary | ICD-10-CM | POA: Diagnosis not present

## 2020-07-02 DIAGNOSIS — R220 Localized swelling, mass and lump, head: Secondary | ICD-10-CM | POA: Diagnosis not present

## 2020-07-02 DIAGNOSIS — M79642 Pain in left hand: Secondary | ICD-10-CM | POA: Diagnosis not present

## 2020-07-02 DIAGNOSIS — R519 Headache, unspecified: Secondary | ICD-10-CM | POA: Diagnosis not present

## 2020-07-02 LAB — COMPREHENSIVE METABOLIC PANEL
ALT: 19 U/L (ref 0–44)
AST: 21 U/L (ref 15–41)
Albumin: 4.3 g/dL (ref 3.5–5.0)
Alkaline Phosphatase: 66 U/L (ref 38–126)
Anion gap: 12 (ref 5–15)
BUN: 18 mg/dL (ref 8–23)
CO2: 26 mmol/L (ref 22–32)
Calcium: 9.2 mg/dL (ref 8.9–10.3)
Chloride: 102 mmol/L (ref 98–111)
Creatinine, Ser: 0.89 mg/dL (ref 0.44–1.00)
GFR, Estimated: >60 mL/min (ref >60)
Glucose, Bld: 182 mg/dL — ABNORMAL HIGH (ref 70–99)
Potassium: 3.4 mmol/L — ABNORMAL LOW (ref 3.5–5.1)
Sodium: 140 mmol/L (ref 135–145)
Total Bilirubin: 0.6 mg/dL (ref 0.3–1.2)
Total Protein: 7.6 g/dL (ref 6.5–8.1)

## 2020-07-02 LAB — CBC WITH DIFFERENTIAL/PLATELET
Abs Immature Granulocytes: 0.02 10*3/uL (ref 0.00–0.07)
Basophils Absolute: 0 10*3/uL (ref 0.0–0.1)
Basophils Relative: 1 %
Eosinophils Absolute: 0.1 10*3/uL (ref 0.0–0.5)
Eosinophils Relative: 1 %
HCT: 39.8 % (ref 36.0–46.0)
Hemoglobin: 13.5 g/dL (ref 12.0–15.0)
Immature Granulocytes: 0 %
Lymphocytes Relative: 35 %
Lymphs Abs: 2.1 10*3/uL (ref 0.7–4.0)
MCH: 31.5 pg (ref 26.0–34.0)
MCHC: 33.9 g/dL (ref 30.0–36.0)
MCV: 92.8 fL (ref 80.0–100.0)
Monocytes Absolute: 1 10*3/uL (ref 0.1–1.0)
Monocytes Relative: 17 %
Neutro Abs: 2.7 10*3/uL (ref 1.7–7.7)
Neutrophils Relative %: 46 %
Platelets: 240 10*3/uL (ref 150–400)
RBC: 4.29 MIL/uL (ref 3.87–5.11)
RDW: 12.3 % (ref 11.5–15.5)
WBC: 6 10*3/uL (ref 4.0–10.5)
nRBC: 0 % (ref 0.0–0.2)

## 2020-07-02 LAB — TROPONIN I (HIGH SENSITIVITY)
Troponin I (High Sensitivity): 2 ng/L (ref <18)
Troponin I (High Sensitivity): 3 ng/L (ref <18)

## 2020-07-02 LAB — MAGNESIUM: Magnesium: 1.9 mg/dL (ref 1.7–2.4)

## 2020-07-02 MED ORDER — FENTANYL CITRATE (PF) 100 MCG/2ML IJ SOLN
50.0000 ug | Freq: Once | INTRAMUSCULAR | Status: AC
  Administered 2020-07-02: 50 ug via INTRAVENOUS
  Filled 2020-07-02: qty 2

## 2020-07-02 NOTE — ED Notes (Signed)
Pt wheeled to lobby

## 2020-07-02 NOTE — ED Notes (Signed)
Pt to radiology.

## 2020-07-02 NOTE — ED Notes (Signed)
Called to Radiology for syncopal episode. Per rad she had LOC during scans. Unable to finish. Upon arrival pt was alert oriented and tearful. Pt taken back to room. IV obtained.

## 2020-07-02 NOTE — Code Documentation (Signed)
Rapid Response called due to patient suddenly becoming unresponsive while on the xray table. Patient would not respond to sternal rub, eyes were closed, and head tilted back; episode lasted less than a minute. Patient came back to before Rapid Response team arrived. Patient was taken back to her room by RR staff to be evaluated, RNs stated they will call us when patient is ready to come back to Radiology for remainder of her imaging.

## 2020-07-10 DIAGNOSIS — M5440 Lumbago with sciatica, unspecified side: Secondary | ICD-10-CM | POA: Diagnosis not present

## 2020-07-26 ENCOUNTER — Encounter: Payer: Self-pay | Admitting: Family Medicine

## 2020-07-26 DIAGNOSIS — E876 Hypokalemia: Secondary | ICD-10-CM

## 2020-07-27 MED ORDER — POTASSIUM CHLORIDE ER 10 MEQ PO TBCR: 10.0000 meq | EXTENDED_RELEASE_TABLET | Freq: Every day | ORAL | 2 refills | Status: DC

## 2020-07-30 ENCOUNTER — Encounter: Payer: Self-pay | Admitting: Family Medicine

## 2020-07-30 DIAGNOSIS — F3341 Major depressive disorder, recurrent, in partial remission: Secondary | ICD-10-CM

## 2020-07-30 DIAGNOSIS — G4700 Insomnia, unspecified: Secondary | ICD-10-CM

## 2020-07-31 MED ORDER — QUETIAPINE FUMARATE 100 MG PO TABS: 50.0000 mg | ORAL_TABLET | Freq: Every day | ORAL | 3 refills | Status: DC

## 2020-08-02 DIAGNOSIS — M5416 Radiculopathy, lumbar region: Secondary | ICD-10-CM | POA: Diagnosis not present

## 2020-08-14 ENCOUNTER — Other Ambulatory Visit: Payer: Self-pay

## 2020-08-14 ENCOUNTER — Encounter: Payer: Medicare Other | Attending: Physical Medicine & Rehabilitation | Admitting: Registered Nurse

## 2020-08-14 ENCOUNTER — Encounter: Payer: Self-pay | Admitting: Registered Nurse

## 2020-08-14 VITALS — BP 145/55 | HR 63 | Temp 98.2°F | Ht 64.0 in | Wt 184.0 lb

## 2020-08-14 DIAGNOSIS — Z5181 Encounter for therapeutic drug level monitoring: Secondary | ICD-10-CM | POA: Diagnosis not present

## 2020-08-14 DIAGNOSIS — M542 Cervicalgia: Secondary | ICD-10-CM

## 2020-08-14 DIAGNOSIS — M5416 Radiculopathy, lumbar region: Secondary | ICD-10-CM

## 2020-08-14 DIAGNOSIS — G2581 Restless legs syndrome: Secondary | ICD-10-CM

## 2020-08-14 DIAGNOSIS — M47812 Spondylosis without myelopathy or radiculopathy, cervical region: Secondary | ICD-10-CM

## 2020-08-14 DIAGNOSIS — G894 Chronic pain syndrome: Secondary | ICD-10-CM

## 2020-08-14 DIAGNOSIS — Z79891 Long term (current) use of opiate analgesic: Secondary | ICD-10-CM | POA: Diagnosis not present

## 2020-08-14 DIAGNOSIS — M5412 Radiculopathy, cervical region: Secondary | ICD-10-CM

## 2020-08-14 MED ORDER — GABAPENTIN 400 MG PO CAPS: 400.0000 mg | ORAL_CAPSULE | Freq: Four times a day (QID) | ORAL | 3 refills | Status: DC

## 2020-08-14 MED ORDER — HYDROCODONE-ACETAMINOPHEN 7.5-325 MG PO TABS: 1.0000 | ORAL_TABLET | Freq: Four times a day (QID) | ORAL | 0 refills | Status: DC | PRN

## 2020-08-14 NOTE — Progress Notes (Signed)
Subjective:    Patient ID: [name], female    DOB: [birth year], 70 y.o.   MRN: [id number]  HPI: [name] is a 70 y.o. female who returns for follow up appointment for chronic pain and medication refill. She states her pain is located in  her neck radiating into her left shoulder and lower back pain radiating into her left hip and left lower extremity. She rates her pain 9. Her current exercise regime is walking and performing stretching exercises.  [name] equivalent is 30.00 MME. She is also prescribed Clonazepam by Dr. Martinique. We have discussed the black box warning of using opioids and benzodiazepines. I highlighted the dangers of using these drugs together and discussed the adverse events including respiratory suppression, overdose, cognitive impairment and importance of compliance with current regimen. We will continue to monitor and adjust as indicated.    Oral Swab was Performed Today.     Pain Inventory Average Pain 9 Pain Right Now 9 My pain is sharp, stabbing and aching  In the last 24 hours, has pain interfered with the following? General activity 7 Relation with others 7 Enjoyment of life 7 What TIME of day is your pain at its worst? morning  and daytime Sleep (in general) Fair  Pain is worse with: bending, sitting, inactivity, standing and some activites Pain improves with: rest, heat/ice, therapy/exercise, pacing activities, medication, TENS and injections Relief from Meds: 10  Family History  Problem Relation Age of Onset  . Diabetes Mother   . Alzheimer's disease Mother   . Lung cancer Father   . Esophageal cancer Brother   . Colon cancer Neg Hx    Social History   Socioeconomic History  . Marital status: Divorced    Spouse name: Not on file  . Number of children: Not on file  . Years of education: Not on file  . Highest education level: Not on file  Occupational History  . Not on file  Tobacco Use  . Smoking status:  Never Smoker  . Smokeless tobacco: Never Used  Vaping Use  . Vaping Use: Never used  Substance and Sexual Activity  . Alcohol use: No  . Drug use: No  . Sexual activity: Not on file  Other Topics Concern  . Not on file  Social History Narrative  . Not on file   Social Determinants of Health   Financial Resource Strain: Not on file  Food Insecurity: Not on file  Transportation Needs: Not on file  Physical Activity: Not on file  Stress: Not on file  Social Connections: Not on file   Past Surgical History:  Procedure Laterality Date  . ABDOMINAL HYSTERECTOMY     partial  . ACHILLES TENDON SURGERY Left   . APPENDECTOMY    . BACK SURGERY    . BLADDER SUSPENSION    . BREAST SURGERY Right 2003   milk duct removed  . CHOLECYSTECTOMY    . FRACTURE SURGERY     collar bone  . NOSE SURGERY     x 2   . OTHER SURGICAL HISTORY     Breast Duct removed  . PARTIAL HYSTERECTOMY    . RECTOCELE REPAIR    . TONSILLECTOMY    . VASCULAR SURGERY Right    right leg  . VEIN SURGERY Right    leg   Past Surgical History:  Procedure Laterality Date  . ABDOMINAL HYSTERECTOMY     partial  . ACHILLES TENDON SURGERY Left   .  APPENDECTOMY    . BACK SURGERY    . BLADDER SUSPENSION    . BREAST SURGERY Right 2003   milk duct removed  . CHOLECYSTECTOMY    . FRACTURE SURGERY     collar bone  . NOSE SURGERY     x 2   . OTHER SURGICAL HISTORY     Breast Duct removed  . PARTIAL HYSTERECTOMY    . RECTOCELE REPAIR    . TONSILLECTOMY    . VASCULAR SURGERY Right    right leg  . VEIN SURGERY Right    leg   Past Medical History:  Diagnosis Date  . 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 Anxiety   . Arthritis   . Diabetes mellitus without complication ([place])   . GERD (gastroesophageal reflux disease)   . Heart murmur   . Hyperlipidemia   . Hypertension   . Neuromuscular disorder ([place])   . Palpitations   . Pneumonia   . PONV (postoperative nausea and vomiting)   . PTSD (post-traumatic stress  disorder)   . Ringing of ears, left    There were no vitals taken for this visit.  Opioid Risk Score:   Fall Risk Score:  `1  Depression screen PHQ 2/9  Depression screen [facility] 2/9 04/10/2020 12/30/2019 12/29/2019 11/04/2019 08/25/2019 04/07/2019 09/10/2018  Decreased Interest 1 1 2 2 2  0 1  Down, Depressed, Hopeless 1 1 2 2 2  0 1  PHQ - 2 Score 2 2 4 4 4  0 2  Altered sleeping - - 3 - 3 1 -  Tired, decreased energy - - 2 - 2 1 -  Change in appetite - - 2 - 2 1 -  Feeling bad or failure about yourself  - - 1 - 2 0 -  Trouble concentrating - - 0 - 2 0 -  Moving slowly or fidgety/restless - - 0 - 2 0 -  Suicidal thoughts - - 0 - 0 0 -  PHQ-9 Score - - 12 - 17 3 -  Difficult doing work/chores - - Somewhat difficult - Very difficult Somewhat difficult -  Some recent data might be hidden   Review of Systems  Constitutional: Negative.   HENT: Negative.   Eyes: Negative.   Respiratory: Negative.   Cardiovascular: Negative.   Gastrointestinal: Negative.   Endocrine: Negative.   Genitourinary: Negative.   Musculoskeletal: Positive for arthralgias, back pain, neck pain and neck stiffness.  Skin: Negative.   Allergic/Immunologic: Negative.   Neurological: Negative.   Hematological: Negative.   Psychiatric/Behavioral: The patient is nervous/anxious.   All other systems reviewed and are negative.      Objective:   Physical Exam Vitals and nursing note reviewed.  Constitutional:      Appearance: Normal appearance.  Neck:     Comments: Cervical Paraspinal Tenderness: C-5-C-6 Cardiovascular:     Rate and Rhythm: Normal rate and regular rhythm.     Pulses: Normal pulses.     Heart sounds: Normal heart sounds.  Pulmonary:     Effort: Pulmonary effort is normal.     Breath sounds: Normal breath sounds.  Musculoskeletal:     Cervical back: Normal range of motion and neck supple.     Comments: Normal Muscle Bulk and Muscle Testing Reveals:  Upper Extremities: Right: Full ROM and Muscle  Strength 5/5 Left: Decreased ROM 45 Degrees and Muscle Strength 4/5  Left AC Joint Tenderness Thoracic Paraspinal Tenderness: T-1-T-3 Lumbar Paraspinal Tenderness: L-3-L-5 Left Greater Trochanter Tenderness Lower Extremities:  Full ROM and Muscle Strength 5/5  Gait   Skin:    General: Skin is warm and dry.  Neurological:     Mental Status: She is alert and oriented to person, place, and time.  Psychiatric:        Mood and Affect: Mood normal.        Behavior: Behavior normal.           Assessment & Plan:  1.Chronic cervicalgia with documented spondylosis on MRI, per Dr. Naaman Plummer Note and Cervical Radiculitis:ContinueGabapentin, Continue HEP as Tolerated.08/14/2020 2. Left shoulder pain most consistent with left rotator cuff syndrome. Mild DJD. Continue HEP as tolerated.Continue to monitor.08/14/2020. Refilled:Hydrocodone7.5/325 mg one tablet every6hours as needed for pain. #120.Second script sent for the following month. We will continue the opioid monitoring program, this consists of regular clinic visits, examinations, urine drug screen, pill counts as well as use of [place] Controlled Substance Reporting system. A 12 month History has been reviewed on the [place] Controlled Substance Reporting Systemon05/01/2021. 3. Left wrist/finger pain most consistent with OA, ?post traumatic. No complaints today.08/14/2020 4. Anxiety: Continue Klonopin, PCP prescribing. Continue to monitor.08/14/2020 5. Restless Leg syndrome: Continue Gabapentin.08/14/2020 7. Sacroiliac inflammation: S/PRightSI injection on 09/20/2019, with No relief noted. Ms. Sahlin seen Dr Marlou Sa in regards to her pain. Dr Cram.Following.Continue to Monitor.08/14/2020. 8. Myofascial Pain: Continue Robaxin as needed.08/14/2020. 9. Bilateral feet with neuropathic pain:Continue Gabapentin. Continue to monitor.08/14/2020. 10.Herniated Nucleus Pulposus L2-3 with recurrent disc rupture:  S/P Posterior Lumbar Interbody Fusion - Lumbar two-Lumbar threeon 01/13/2020 by Dr Cram.Dr Saintclair Halsted Following.08/14/2020  F/U in40months

## 2020-08-19 LAB — DRUG TOX MONITOR 1 W/CONF, ORAL FLD
Amphetamines: NEGATIVE ng/mL (ref <10)
Barbiturates: NEGATIVE ng/mL (ref <10)
Benzodiazepines: NEGATIVE ng/mL (ref <0.50)
Buprenorphine: NEGATIVE ng/mL (ref <0.10)
Cocaine: NEGATIVE ng/mL (ref <5.0)
Codeine: NEGATIVE ng/mL (ref <2.5)
Dihydrocodeine: NEGATIVE ng/mL (ref <2.5)
Fentanyl: NEGATIVE ng/mL (ref <0.10)
Heroin Metabolite: NEGATIVE ng/mL (ref <1.0)
Hydrocodone: 14.6 ng/mL — ABNORMAL HIGH (ref <2.5)
Hydromorphone: NEGATIVE ng/mL (ref <2.5)
MARIJUANA: NEGATIVE ng/mL (ref <2.5)
MDMA: NEGATIVE ng/mL (ref <10)
Meprobamate: NEGATIVE ng/mL (ref <2.5)
Methadone: NEGATIVE ng/mL (ref <5.0)
Morphine: NEGATIVE ng/mL (ref <2.5)
Nicotine Metabolite: NEGATIVE ng/mL (ref <5.0)
Norhydrocodone: NEGATIVE ng/mL (ref <2.5)
Noroxycodone: NEGATIVE ng/mL (ref <2.5)
Opiates: POSITIVE ng/mL — AB (ref <2.5)
Oxycodone: NEGATIVE ng/mL (ref <2.5)
Oxymorphone: NEGATIVE ng/mL (ref <2.5)
Phencyclidine: NEGATIVE ng/mL (ref <10)
Tapentadol: NEGATIVE ng/mL (ref <5.0)
Tramadol: NEGATIVE ng/mL (ref <5.0)
Zolpidem: NEGATIVE ng/mL (ref <5.0)

## 2020-08-19 LAB — DRUG TOX ALC METAB W/CON, ORAL FLD: Alcohol Metabolite: NEGATIVE ng/mL (ref <25)

## 2020-08-21 ENCOUNTER — Other Ambulatory Visit: Payer: Self-pay

## 2020-08-21 ENCOUNTER — Encounter: Payer: Self-pay | Admitting: Family Medicine

## 2020-08-21 DIAGNOSIS — F32A Depression, unspecified: Secondary | ICD-10-CM

## 2020-08-21 DIAGNOSIS — F419 Anxiety disorder, unspecified: Secondary | ICD-10-CM

## 2020-08-21 MED ORDER — CLONAZEPAM 1 MG PO TABS: 1.0000 mg | ORAL_TABLET | Freq: Two times a day (BID) | ORAL | 0 refills | Status: DC | PRN

## 2020-08-21 NOTE — Telephone Encounter (Signed)
Refilled once in absence of primary provider

## 2020-08-23 ENCOUNTER — Encounter: Payer: Self-pay | Admitting: Family Medicine

## 2020-08-23 ENCOUNTER — Telehealth: Payer: Self-pay | Admitting: Orthopedic Surgery

## 2020-08-23 DIAGNOSIS — M5416 Radiculopathy, lumbar region: Secondary | ICD-10-CM | POA: Diagnosis not present

## 2020-08-23 NOTE — Telephone Encounter (Signed)
Patient called advised she is closer to the  [facility] office and would like to schedule an appointment with Dr Lorin Mercy or Dr Durward Fortes. The number to contact patient is needed is [phone]

## 2020-08-24 ENCOUNTER — Encounter: Payer: Self-pay | Admitting: Family Medicine

## 2020-08-24 ENCOUNTER — Other Ambulatory Visit: Payer: Self-pay

## 2020-08-24 ENCOUNTER — Ambulatory Visit (INDEPENDENT_AMBULATORY_CARE_PROVIDER_SITE_OTHER): Payer: Medicare Other | Admitting: Family Medicine

## 2020-08-24 VITALS — BP 150/60 | HR 97 | Temp 98.1°F | Resp 16 | Ht 64.0 in | Wt 186.6 lb

## 2020-08-24 DIAGNOSIS — M48061 Spinal stenosis, lumbar region without neurogenic claudication: Secondary | ICD-10-CM

## 2020-08-24 DIAGNOSIS — I499 Cardiac arrhythmia, unspecified: Secondary | ICD-10-CM | POA: Diagnosis not present

## 2020-08-24 DIAGNOSIS — F419 Anxiety disorder, unspecified: Secondary | ICD-10-CM

## 2020-08-24 DIAGNOSIS — F3341 Major depressive disorder, recurrent, in partial remission: Secondary | ICD-10-CM | POA: Diagnosis not present

## 2020-08-24 DIAGNOSIS — W19XXXD Unspecified fall, subsequent encounter: Secondary | ICD-10-CM | POA: Diagnosis not present

## 2020-08-24 DIAGNOSIS — I10 Essential (primary) hypertension: Secondary | ICD-10-CM

## 2020-08-24 DIAGNOSIS — G4700 Insomnia, unspecified: Secondary | ICD-10-CM

## 2020-08-24 DIAGNOSIS — E876 Hypokalemia: Secondary | ICD-10-CM

## 2020-08-24 DIAGNOSIS — G63 Polyneuropathy in diseases classified elsewhere: Secondary | ICD-10-CM | POA: Diagnosis not present

## 2020-08-24 DIAGNOSIS — K3184 Gastroparesis: Secondary | ICD-10-CM

## 2020-08-24 MED ORDER — ONDANSETRON 4 MG PO TBDP: 4.0000 mg | ORAL_TABLET | Freq: Every day | ORAL | 2 refills | Status: DC | PRN

## 2020-08-24 NOTE — Assessment & Plan Note (Addendum)
Still symptomatic. Recommend discussing Gabapentin lack of effectiveness with prescriber. Continue following with orthopedist and pain management.

## 2020-08-24 NOTE — Progress Notes (Signed)
Chief Complaint  Patient presents with  . Hypertension    Patient complains of Hypertension, Patient states medication may have caused irregular heartbeat, low heart rate. Patient googled Melatonin and Seroquil side effects states she did not sleep last nightPatient stopped taking Valsartan due to side effects and stated her blood pressure was under control.    HPI: [name] is a 70 y.o. female with hx of HTN,DM II,peripheral neuropathy,and chronic pain here today with above complaints. She has some questions about Seroquel.  BP elevated at home after fall, EMS recommended ER evaluation, 07/01/20. According to pt, BP was 200/184.  She fell in her kitchen, left LE felt weak,tripped and landed on her left side.  Head and neck CT on 07/02/20: 1. No acute intracranial abnormality. 2. Soft tissue swelling and subcutaneous edema overlying the left frontal bone. 3. No evidence of acute fracture or subluxation of the cervical spine. 4. Similar multilevel degenerative degenerative changes cervical spine.  Left shoulder, left humerus, left hip, left hand, and lumbar spine DG negative for fractures. Mild hypoK+.  Pain has improved but still having some limitation of left shoulder ROM and hip pain. She has an appt with ortho to address these problems.  She has history of irregular HR, she has not had palpitations, CP, or dyspnea; but when trying to check her pulse, she noted a pause about every 3 beats. She had EKG done during her last ER visit, SR, ventricular trigeminy, LAD. Hypertension on HCTZ 25 mg daily. She is no longer taking valsartan, afraid of side effects. She does not have a BP monitor. Negative for unusual/severe headache, visual changes, focal neurologic deficit, or edema.  Lab Results  Component Value Date   CREATININE 0.89 07/02/2020   BUN 18 07/02/2020   NA 140 07/02/2020   K 3.4 (L) 07/02/2020   CL 102 07/02/2020   CO2 26 07/02/2020   Lab Results   Component Value Date   WBC 6.0 07/02/2020   HGB 13.5 07/02/2020   HCT 39.8 07/02/2020   MCV 92.8 07/02/2020   PLT 240 07/02/2020   Troponin x 2 < 18 ng/L.  Insomnia: Currently she is on Seroquel 100 mg 1/2 tablet daily at bedtime, medication is helping. She also takes melatonin 10 mg right before bedtime. In the past she had been on doxepin, which was discontinued because did not help. She is concerned about Seroquel causing bradycardia. In general she has tolerated medication well and it seems to be helping.  States that when she had episode of bradycardia she had just taken "double dose" of Hydrocodone-Acetaminophen; so she thinks this may have also been a contributing factor.  Depression and anxiety has been exacerbated by recent health issues. Seroquel 50 mg has helped some. She was on Cymbalta in the past.  She is on clonazepam 1 mg twice daily. Panic attacks not very frequent but when it happens she does "not try to stop it." She feels better after crying.  Depression screen [facility] 2/9 08/24/2020 04/10/2020 12/30/2019 12/29/2019 11/04/2019  Decreased Interest 2 1 1 2 2   Down, Depressed, Hopeless 2 1 1 2 2   PHQ - 2 Score 4 2 2 4 4   Altered sleeping 3 - - 3 -  Tired, decreased energy 2 - - 2 -  Change in appetite 1 - - 2 -  Feeling bad or failure about yourself  0 - - 1 -  Trouble concentrating 0 - - 0 -  Moving slowly or fidgety/restless  0 - - 0 -  Suicidal thoughts 0 - - 0 -  PHQ-9 Score 10 - - 12 -  Difficult doing work/chores Very difficult - - Somewhat difficult -  Some recent data might be hidden   GAD 7 : Generalized Anxiety Score 08/24/2020 12/29/2019 08/25/2019  Nervous, Anxious, on Edge 2 2 3   Control/stop worrying 2 2 3   Worry too much - different things 2 2 3   Trouble relaxing 2 3 3   Restless 2 2 2   Easily annoyed or irritable 2 2 2   Afraid - awful might happen 0 2 2  Total GAD 7 Score 12 15 18   Anxiety Difficulty Very difficult Somewhat difficult Very difficult    S/P lumbar spine surgery in 01/2020 but still having lower back pain bilateral lower extremities. Peripheral neuropathy getting worse,numbness/tingling. She has been evaluated by neurologist, Dr Rexene Alberts. "Fire" like sensation,pretibial area and bilateral. Gabapentin has been increased recently, she does not feel like medication is helping.  Negative for saddle anesthesia or bowel/bladder dysfunction. Following with orthopedist. Pain has improved after having epidural injection but not as much as she would like to. According to pt,another surgical procedure is being considered.  She is also requesting a prescription for Zofran dissolving tablet, which she has taken in the past and has helped with nausea. For the past 3 to 4 months she has had intermittent nausea, not daily, it is aggravated by food intake.  Very occasionally she has provoked vomiting to relieve problem.  Negative for abdominal pain, changes in bowel habits, hematemesis, or melena. Chronic pain on chronic opioid intake. GERD: She is currently on omeprazole 40 mg daily, she has not had heartburn.  Review of Systems  Constitutional: Positive for fatigue. Negative for activity change, appetite change and fever.  HENT: Negative for mouth sores, nosebleeds, sore throat and trouble swallowing.   Respiratory: Negative for cough and wheezing.   Gastrointestinal: Negative for blood in stool and vomiting.  Endocrine: Negative for cold intolerance and heat intolerance.  Genitourinary: Negative for decreased urine volume and hematuria.  Musculoskeletal: Positive for arthralgias and back pain. Negative for gait problem.  Neurological: Negative for syncope, facial asymmetry and weakness.  Hematological: Negative for adenopathy. Does not bruise/bleed easily.  Psychiatric/Behavioral: Positive for sleep disturbance. Negative for confusion and hallucinations. The patient is nervous/anxious.   Rest see pertinent positives and negatives per  HPI.  Current Outpatient Medications on File Prior to Visit  Medication Sig Dispense Refill  . ACCU-CHEK SOFTCLIX LANCETS lancets Use to test blood sugar 3 times daily. Dx: E11.8 100 each 12  . albuterol (VENTOLIN HFA) 108 (90 Base) MCG/ACT inhaler INHALE 2 PUFFS INTO THE LUNGS EVERY 6 HOURS AS NEEDED FOR WHEEZING OR SHORTNESS OF BREATH 54 g 0  . Alcohol Swabs (B-D SINGLE USE SWABS REGULAR) PADS Use to test blood sugar 3 times daily 100 each 3  . Blood Glucose Calibration (ACCU-CHEK AVIVA) SOLN Use as directed 1 each 2  . Blood Glucose Monitoring Suppl (ACCU-CHEK AVIVA PLUS) w/Device KIT Use to check blood sugar 3 times daily 1 kit 1  . clonazePAM (KLONOPIN) 1 MG tablet Take 1 tablet (1 mg total) by mouth 2 (two) times daily as needed for anxiety. 60 tablet 0  . Dulaglutide (TRULICITY) 1.5 QV/9.5GL SOPN Inject 1.5 mg into the skin once a week. 13 mL 3  . glucose blood (ACCU-CHEK AVIVA) test strip Use to test blood sugar 3 times daily. Dx: 11.8 100 each 12  . hydrochlorothiazide (HYDRODIURIL) 25  MG tablet TAKE 1 TABLET BY MOUTH EVERY DAY 90 tablet 3  . HYDROcodone-acetaminophen (NORCO) 7.5-325 MG tablet Take 1 tablet by mouth every 6 (six) hours as needed for moderate pain. 120 tablet 0  . ipratropium (ATROVENT) 0.06 % nasal spray Place 2 sprays into both nostrils 4 (four) times daily. (Patient taking differently: Place 2 sprays into both nostrils 4 (four) times daily as needed (seasonal allergies.).) 15 mL 3  . omeprazole (PRILOSEC) 40 MG capsule Take 1 capsule (40 mg total) by mouth daily. 90 capsule 2  . potassium chloride (KLOR-CON) 10 MEQ tablet Take 1 tablet (10 mEq total) by mouth daily. 90 tablet 2  . QUEtiapine (SEROQUEL) 100 MG tablet Take 0.5 tablets (50 mg total) by mouth at bedtime. 45 tablet 3  . triamcinolone (KENALOG) 0.1 % paste Use as directed 1 application in the mouth or throat 2 (two) times daily. (Patient taking differently: Use as directed 1 application in the mouth or throat  2 (two) times daily as needed (irritation.).) 5 g 1   No current facility-administered medications on file prior to visit.   Past Medical History:  Diagnosis Date  . 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 Anxiety   . Arthritis   . Diabetes mellitus without complication ([place])   . GERD (gastroesophageal reflux disease)   . Heart murmur   . Hyperlipidemia   . Hypertension   . Neuromuscular disorder ([place])   . Palpitations   . Pneumonia   . PONV (postoperative nausea and vomiting)   . PTSD (post-traumatic stress disorder)   . Ringing of ears, left    Allergies  Allergen Reactions  . Hydromorphone Nausea And Vomiting and Other (See Comments)    Made the patient feel "spaced out," also Made the patient feel "spaced out," also  . Morphine And Related Itching    With high doses  . Penicillins Rash    Has patient had a PCN reaction causing immediate rash, facial/tongue/throat swelling, SOB or lightheadedness with hypotension: Yes Has patient had a PCN reaction causing severe rash involving mucus membranes or skin necrosis: No Has patient had a PCN reaction that required hospitalization: No Has patient had a PCN reaction occurring within the last 10 years: No If all of the above answers are "NO", then may proceed with Cephalosporin use.     Social History   Socioeconomic History  . Marital status: Divorced    Spouse name: Not on file  . Number of children: Not on file  . Years of education: Not on file  . Highest education level: Not on file  Occupational History  . Not on file  Tobacco Use  . Smoking status: Never Smoker  . Smokeless tobacco: Never Used  Vaping Use  . Vaping Use: Never used  Substance and Sexual Activity  . Alcohol use: No  . Drug use: No  . Sexual activity: Not on file  Other Topics Concern  . Not on file  Social History Narrative  . Not on file   Social Determinants of Health   Financial Resource Strain: Not on file  Food Insecurity: Not on file   Transportation Needs: Not on file  Physical Activity: Not on file  Stress: Not on file  Social Connections: Not on file   Vitals:   08/24/20 1526  BP: (!) 150/60  Pulse: 97  Resp: 16  Temp: 98.1 F (36.7 C)  SpO2: 95%   Body mass index is 32.03 kg/m.  Physical Exam Vitals and  nursing note reviewed.  Constitutional:      General: She is not in acute distress.    Appearance: She is well-developed.  HENT:     Head: Normocephalic and atraumatic.     Mouth/Throat:     Mouth: Mucous membranes are dry.     Pharynx: Oropharynx is clear.  Eyes:     Conjunctiva/sclera: Conjunctivae normal.  Cardiovascular:     Rate and Rhythm: Normal rate. Rhythm irregular. Occasional extrasystoles are present.    Heart sounds: No murmur heard.     Comments: DP and PT present,bilateral. RLE bigger diameter when compared with LLE, chronic. Pulmonary:     Effort: Pulmonary effort is normal. No respiratory distress.     Breath sounds: Normal breath sounds.  Abdominal:     Palpations: Abdomen is soft. There is no mass.     Tenderness: There is no abdominal tenderness.  Skin:    General: Skin is warm.     Findings: No erythema or rash.  Neurological:     General: No focal deficit present.     Mental Status: She is alert and oriented to person, place, and time.     Cranial Nerves: No cranial nerve deficit.     Gait: Gait normal.  Psychiatric:        Mood and Affect: Mood is anxious. Affect is labile.     Comments: Well groomed, good eye contact.   ASSESSMENT AND PLAN:  Ms. Kimble was seen today for hypertension.  Diagnoses and all orders for this visit: Orders Placed This Encounter  Procedures  . Ambulatory referral to Cardiology    Cardiac arrhythmia, unspecified cardiac arrhythmia type Currently asymptomatic. Clearly instructed about warning signs. Eventually she is going to need cardiac clearance for possible lumbar surgery. Cardio referral placed.  Polyneuropathy associated  with underlying disease ([place]) Continue adequate foot care. No changes in Gabapentin. Cymbalta did not help in the past.  Fall, subsequent encounter High risk for falls given her chronic medical conditions and medications. Fall precautions discussed.  HYPOKALEMIA Mild. No changes in HCTZ for now. Continue potassium rich diet.  Essential hypertension BP mildly elevated today. Afraid of medication side effects, so for now recommend continuing HCTZ 25 mg daily. Stressed the importance of checking BP at home. We discussed general guidelines in regard to BP goals. Continue low-salt diet.  Depression, major, recurrent, in partial remission ([place]) Problem is stable. We discussed some side effects of Seroquel, she agrees to continue 50 mg daily.  Spinal stenosis of lumbar region Still symptomatic. Recommend discussing Gabapentin lack of effectiveness with prescriber. Continue following with orthopedist and pain management.  Insomnia Problem has improved with Seroquel. We discussed some side effects of Seroquel, I do not think it is BP elevation or episodes of bradycardia. Based on benefit vs risk, she would like to continue Seroquel 50 mg at bedtime. Adequate sleep hygiene also recommended. Melatonin 10 mg to take 2 hours before bedtime.  GASTROPARESIS Most likely due to chronic opioids intake, diabetes can also be a contributing factor. We discussed treatment options. Zofran 4 mg daily as needed recommended for now. If needed we could consider Reglan later on. Small portions and GERD precautions will also help.  Anxiety disorder, unspecified Problem is stable. Continue clonazepam 1 mg twice daily. We discussed some side effects of medications, including risk when taking with opioids.  Spent 55 minutes with pt.  During this time history was obtained and documented, examination was performed, prior labs/imaging reviewed, and assessment/plan discussed.  We also discussed recent  message about Seroquel, I sent message explaining side effects of Doxepin, which she also took in the past. I clarified message.  Return if symptoms worsen or fail to improve, for Keep next appt.   Nalaysia Manganiello G. Martinique, MD  [facility] [place] office.   A few things to remember from today's visit:   Cardiac arrhythmia, unspecified cardiac arrhythmia type - Plan: Ambulatory referral to Cardiology  Depression, major, recurrent, in partial remission ([place])  Polyneuropathy associated with underlying disease ([place])  Fall, subsequent encounter  Essential hypertension  If you need refills please call your pharmacy. Do not use My Chart to request refills or for acute issues that need immediate attention.   For now continue Seroquel 50 mg at bedtime. Melatonin 2 hours before bedtime. Appt with cardio will be arranged. Monitor blood pressure and heart rate at home.  Please be sure medication list is accurate. If a new problem present, please set up appointment sooner than planned today.

## 2020-08-24 NOTE — Assessment & Plan Note (Signed)
Problem is stable. We discussed some side effects of Seroquel, she agrees to continue 50 mg daily.

## 2020-08-24 NOTE — Patient Instructions (Signed)
A few things to remember from today's visit:   Cardiac arrhythmia, unspecified cardiac arrhythmia type - Plan: Ambulatory referral to Cardiology  Depression, major, recurrent, in partial remission ([place])  Polyneuropathy associated with underlying disease ([place])  Fall, subsequent encounter  Essential hypertension  If you need refills please call your pharmacy. Do not use My Chart to request refills or for acute issues that need immediate attention.   For now continue Seroquel 50 mg at bedtime. Melatonin 2 hours before bedtime. Appt with cardio will be arranged. Monitor blood pressure and heart rate at home.  Please be sure medication list is accurate. If a new problem present, please set up appointment sooner than planned today.

## 2020-08-24 NOTE — Telephone Encounter (Signed)
Can you please help patient with this

## 2020-08-24 NOTE — Telephone Encounter (Signed)
I spoke with the patient and she stated she would be at the office by 3pm today.

## 2020-08-24 NOTE — Assessment & Plan Note (Signed)
Problem has improved with Seroquel. We discussed some side effects of Seroquel, I do not think it is BP elevation or episodes of bradycardia. Based on benefit vs risk, she would like to continue Seroquel 50 mg at bedtime. Adequate sleep hygiene also recommended. Melatonin 10 mg to take 2 hours before bedtime.

## 2020-08-24 NOTE — Assessment & Plan Note (Addendum)
Most likely due to chronic opioids intake, diabetes can also be a contributing factor. We discussed treatment options. Zofran 4 mg daily as needed recommended for now. If needed we could consider Reglan later on. Small portions and GERD precautions will also help.

## 2020-08-24 NOTE — Assessment & Plan Note (Signed)
Mild. No changes in HCTZ for now. Continue potassium rich diet.

## 2020-08-24 NOTE — Assessment & Plan Note (Signed)
BP mildly elevated today. Afraid of medication side effects, so for now recommend continuing HCTZ 25 mg daily. Stressed the importance of checking BP at home. We discussed general guidelines in regard to BP goals. Continue low-salt diet.

## 2020-08-24 NOTE — Assessment & Plan Note (Signed)
Problem is stable. Continue clonazepam 1 mg twice daily. We discussed some side effects of medications, including risk when taking with opioids.

## 2020-08-28 ENCOUNTER — Telehealth: Payer: Self-pay | Admitting: *Deleted

## 2020-08-28 ENCOUNTER — Telehealth: Payer: Self-pay | Admitting: Pharmacist

## 2020-08-28 ENCOUNTER — Encounter: Payer: Self-pay | Admitting: Family Medicine

## 2020-08-28 NOTE — Telephone Encounter (Signed)
Oral swab drug screen was consistent for prescribed medications.  ?

## 2020-08-28 NOTE — Chronic Care Management (AMB) (Signed)
    Chronic Care Management Pharmacy Assistant   Name: Earlena Werst Ballantine  MRN: [id number] DOB: 1950-09-13  Patient returned my call and was very angry saying that she cannot verify who I am because my number shows up as spam on her phone and does not know why I am calling her. I tried to explain to patient about why I was calling to verify if she wanted to unenroll from CCM program or not die to request per PTM Pattricia Boss. I explained to patient that I am calling on behalf of Jeni Salles the clinical pharmacist that works with patient pcp office. Patient stated until she can verify who I am and accuracy of what I said she refuses to talk to me.   [place]  Clinical Pharmacist Assistant [phone]

## 2020-08-30 DIAGNOSIS — M5416 Radiculopathy, lumbar region: Secondary | ICD-10-CM | POA: Diagnosis not present

## 2020-08-30 DIAGNOSIS — I10 Essential (primary) hypertension: Secondary | ICD-10-CM | POA: Diagnosis not present

## 2020-08-30 DIAGNOSIS — Z6828 Body mass index (BMI) 28.0-28.9, adult: Secondary | ICD-10-CM | POA: Diagnosis not present

## 2020-08-30 DIAGNOSIS — M4807 Spinal stenosis, lumbosacral region: Secondary | ICD-10-CM | POA: Diagnosis not present

## 2020-09-01 ENCOUNTER — Encounter: Payer: Self-pay | Admitting: Family Medicine

## 2020-09-01 DIAGNOSIS — K120 Recurrent oral aphthae: Secondary | ICD-10-CM

## 2020-09-04 MED ORDER — TRIAMCINOLONE ACETONIDE 0.1 % MT PSTE: 1.0000 "application " | PASTE | Freq: Two times a day (BID) | OROMUCOSAL | 1 refills | Status: DC

## 2020-09-06 ENCOUNTER — Ambulatory Visit (INDEPENDENT_AMBULATORY_CARE_PROVIDER_SITE_OTHER): Payer: Medicare Other | Admitting: Orthopaedic Surgery

## 2020-09-06 ENCOUNTER — Encounter: Payer: Self-pay | Admitting: Orthopaedic Surgery

## 2020-09-06 ENCOUNTER — Other Ambulatory Visit: Payer: Self-pay

## 2020-09-06 VITALS — Ht 68.0 in | Wt 185.0 lb

## 2020-09-06 DIAGNOSIS — M48061 Spinal stenosis, lumbar region without neurogenic claudication: Secondary | ICD-10-CM

## 2020-09-06 NOTE — Progress Notes (Signed)
Office Visit Note   Patient: [name]           Date of Birth: 1950-06-28           MRN: [id number] Visit Date: 09/06/2020              Requested by: Martinique, Betty G, MD [address] [place],  [place] [address] PCP: Martinique, Betty G, MD   Assessment & Plan: Visit Diagnoses:  1. Spinal stenosis of lumbar region, unspecified whether neurogenic claudication present     Plan: Patient with lumbar spinal stenosis and some claudication symptoms previous single level fusion.  Surgical options would be decompression at the 4 5 level as well as L3-4 level with lateral recess decompression with or without fusion.  Discussion about potential for adjacent level progression with more fusions at other levels which she is aware of.  We discussed it does not appear her symptoms are coming from her hip joints or knee joints.  We discussed pool exercises and swimming as exercise she could do that would not aggravate her back and she could still do cardiovascular workout activities which should help with other problems including type 2 diabetes, cardio work, stress reduction etc. without bothering her back.  Follow-Up Instructions: Return if symptoms worsen or fail to improve.   Orders:  No orders of the defined types were placed in this encounter.  No orders of the defined types were placed in this encounter.     Procedures: No procedures performed   Clinical Data: No additional findings.   Subjective: Chief Complaint  Patient presents with  . Left Shoulder - Pain    Fall 07/01/2020  . Left Hip - Pain    Fall 07/01/2020    HPI 70 year old female with history of fall 07/01/2020 2 months ago.  Patient states that she went airborne came down landed on her left side.  She had pain in her left shoulder and states she has been seen by [facility] pain management and is on hydrocodone 7.5/325 receiving 120 tablets monthly.  She also takes clonazepam 1 mg total of 60 tablets monthly.   Patient states she has had difficulty reaching behind her past the posterior axillary line.  She has been doing home exercise program trying to raise her arm but has pain in her shoulder with this.  She was told she might have some small tears in her rotator cuff.  She also has pain in her left groin bothers her with some mobility but seems to be doing better right now.  Dr. Saintclair Halsted and discussed possibly more back surgery for her but was wanting her to be evaluated prior to that.  Lumbar MRI scan in March 2022 showed previous decompression fusion at L2-3.  Some progression of L4-5 spinal stenosis without instability and also slight worsening of the lateral recess stenosis at L3-4 not as severe at L4-5.  Patient very descriptive in all of her medical encounters with problems that she has had.  X-rays of her shoulder has been negative on the left.  Hip x-rays in March negative as well.  Metal clips right groin.  Lumbar endplate spurring noted on plain radiographs.  Review of Systems negative for chills or fever.  Positive for claudication symptoms.   Objective: Vital Signs: Ht 5\' 8"  (1.727 m)   Wt 185 lb (83.9 kg)   BMI 28.13 kg/m   Physical Exam Constitutional:      Appearance: She is well-developed.  HENT:  Head: Normocephalic.     Right Ear: External ear normal.     Left Ear: External ear normal.  Eyes:     Pupils: Pupils are equal, round, and reactive to light.  Neck:     Thyroid: No thyromegaly.     Trachea: No tracheal deviation.  Cardiovascular:     Rate and Rhythm: Normal rate.  Pulmonary:     Effort: Pulmonary effort is normal.  Abdominal:     Palpations: Abdomen is soft.  Skin:    General: Skin is warm and dry.  Neurological:     Mental Status: She is alert and oriented to person, place, and time.  Psychiatric:        Behavior: Behavior normal.     Ortho Exam well-healed lumbar incision.  Negative logroll to the hips.  No pain with internal rotation of either hip.  No  flexion contracture.  Some pain with impingement test of her left shoulder.  She can reach behind posterior axillary line but has some discomfort.  Long head of the biceps is normal.  No subluxation of the shoulder.  Specialty Comments:  MRI Cervical Spine: Multilevel degenerative changes most notably at C4-5. Left shoulder x-ray: Mild degenerative arthropathy of the glenohumeral & AC Joint.  Imaging: Wo Contrast  Study Result  Narrative & Impression  CLINICAL DATA:  Low back pain with sciatica radiating to both legs, left-greater-than-right. Weakness and numbness in the left leg.  EXAM: MRI LUMBAR SPINE WITHOUT AND WITH CONTRAST  TECHNIQUE: Multiplanar and multiecho pulse sequences of the lumbar spine were obtained without and with intravenous contrast.  CONTRAST:  66mL MULTIHANCE GADOBENATE DIMEGLUMINE 529 MG/ML IV SOLN  COMPARISON:  12/25/2019  FINDINGS: Segmentation:  Standard  Alignment:  Normal  Vertebrae:  L2-3 PLIF.  No acute fracture.  Conus medullaris and cauda equina: Conus extends to the L1 level. Conus and cauda equina appear normal.  Paraspinal and other soft tissues: Postoperative changes in the dorsal soft tissues at L2-3.  Disc levels:  T12-L1: Normal.  L1-2: Unchanged small disc bulge without stenosis.  L2-3: Status post PLIF. Widely patent thecal sac. No neural impingement.  L3-4: Slightly worsened intermediate sized disc bulge. Left-greater-than-right lateral recess narrowing without central spinal canal stenosis. No neural impingement.  L4-5: Remote postsurgical change. Intermediate sized disc bulge with superimposed central protrusion has progressed. Severe spinal canal stenosis. Mild bilateral foraminal stenosis. Moderate facet hypertrophy.  L5-S1: Unchanged small left subarticular disc extrusion with inferior migration in close proximity to the left S1 nerve root. Mild left foraminal stenosis. No central spinal canal  stenosis.  IMPRESSION: 1. Status post L2-3 PLIF with widely patent spinal canal and no neural impingement. 2. Progression of L4-5 degenerative disc disease with severe spinal canal stenosis and mild bilateral foraminal stenosis. 3. Unchanged small left subarticular disc extrusion with inferior migration at L5-S1 in close proximity to the left S1 nerve root. 4. Slightly worsened L3-4 disc bulge with left-greater-than-right lateral recess stenosis but no neural impingement.   Electronically Signed   By: Ulyses Jarred M.D.   On: 07/02/2020 01:07      PMFS History: Patient Active Problem List   Diagnosis Date Noted  . Anxiety disorder, unspecified 01/27/2020  . HNP (herniated nucleus pulposus), lumbar 01/13/2020  . PTSD (post-traumatic stress disorder)   . Polyneuropathy associated with underlying disease ([place]) 07/26/2019  . Allergic rhinitis 07/26/2019  . Atherosclerosis of aorta ([place]) 04/04/2019  . Myalgia due to statin 11/18/2017  . Depression, major, recurrent, in partial  remission ([place]) 10/16/2017  . Insomnia 10/16/2017  . Chronic pain syndrome 09/07/2017  . Spinal stenosis of lumbar region 07/08/2017  . DOE (dyspnea on exertion) 12/16/2016  . Family history of early CAD 12/16/2016  . Other chest pain 12/16/2016  . Arthritis of left acromioclavicular joint 08/13/2016  . Left shoulder pain 05/12/2016  . Cervical spondylosis without myelopathy 05/12/2016  . Rotator cuff syndrome, left 05/12/2016  . Restless leg syndrome 05/12/2016  . Primary osteoarthritis, left hand 05/12/2016  . MELENA 10/18/2008  . GASTROPARESIS 05/30/2008  . Type 2 diabetes mellitus with neurological complications ([place]) 26/37/8588  . HYPERCHOLESTEROLEMIA 05/04/2008  . HYPOKALEMIA 05/04/2008  . GERD 05/04/2008  . Constipation 05/04/2008  . HEMATEMESIS 05/04/2008  . Essential hypertension 05/04/2008   Past Medical History:  Diagnosis Date  . 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[place])   . Anxiety   .  Arthritis   . Diabetes mellitus without complication ([place])   . GERD (gastroesophageal reflux disease)   . Heart murmur   . Hyperlipidemia   . Hypertension   . Neuromuscular disorder ([place])   . Palpitations   . Pneumonia   . PONV (postoperative nausea and vomiting)   . PTSD (post-traumatic stress disorder)   . Ringing of ears, left     Family History  Problem Relation Age of Onset  . Diabetes Mother   . Alzheimer's disease Mother   . Lung cancer Father   . Esophageal cancer Brother   . Colon cancer Neg Hx     Past Surgical History:  Procedure Laterality Date  . ABDOMINAL HYSTERECTOMY     partial  . ACHILLES TENDON SURGERY Left   . APPENDECTOMY    . BACK SURGERY    . BLADDER SUSPENSION    . BREAST SURGERY Right 2003   milk duct removed  . CHOLECYSTECTOMY    . FRACTURE SURGERY     collar bone  . NOSE SURGERY     x 2   . OTHER SURGICAL HISTORY     Breast Duct removed  . PARTIAL HYSTERECTOMY    . RECTOCELE REPAIR    . TONSILLECTOMY    . VASCULAR SURGERY Right    right leg  . VEIN SURGERY Right    leg   Social History   Occupational History  . Not on file  Tobacco Use  . Smoking status: Never Smoker  . Smokeless tobacco: Never Used  Vaping Use  . Vaping Use: Never used  Substance and Sexual Activity  . Alcohol use: No  . Drug use: No  . Sexual activity: Not on file

## 2020-09-10 ENCOUNTER — Ambulatory Visit: Payer: Medicare Other | Admitting: Internal Medicine

## 2020-09-10 ENCOUNTER — Ambulatory Visit (INDEPENDENT_AMBULATORY_CARE_PROVIDER_SITE_OTHER): Payer: Medicare Other

## 2020-09-10 ENCOUNTER — Other Ambulatory Visit: Payer: Self-pay

## 2020-09-10 ENCOUNTER — Encounter: Payer: Self-pay | Admitting: Internal Medicine

## 2020-09-10 ENCOUNTER — Other Ambulatory Visit: Payer: Self-pay | Admitting: Internal Medicine

## 2020-09-10 VITALS — BP 142/78 | HR 52 | Ht 68.0 in | Wt 188.0 lb

## 2020-09-10 DIAGNOSIS — R002 Palpitations: Secondary | ICD-10-CM

## 2020-09-10 NOTE — Progress Notes (Signed)
Cardiology Office Note   Date:  09/10/2020   ID:  [name], DOB 01-Aug-1950, MRN [id number]  PCP:  Martinique, Betty G, MD  Cardiologist:   Dorris Carnes, MD   Patient referred for evaluation of irregular pulse, slow pulse.  Also for preop risk stratification.    History of Present Illness: [name] is a 70 y.o. female with a history of diabetes she is followed in medicine clinic by Dr. Martinique.  Told her that her pulse is irregular.  She is also seeing orthopedics for her back.  She was told there that her pulse was slow at times.  Being evaluated for repeat surgical intervention possibly pulse again noted to be slow between this and the irregular heartbeat referred to cardiology.  Per the patient's report heart rate is in the 40s at times.  She has occasional dizzy spells but no syncope.  Because of her back and also falling she is limited her activity plans to start water therapy today.  The pt  denies chest pain with activity.  Says her breathing is fairly stable. i      Current Meds  Medication Sig  . ACCU-CHEK SOFTCLIX LANCETS lancets Use to test blood sugar 3 times daily. Dx: E11.8  . albuterol (VENTOLIN HFA) 108 (90 Base) MCG/ACT inhaler INHALE 2 PUFFS INTO THE LUNGS EVERY 6 HOURS AS NEEDED FOR WHEEZING OR SHORTNESS OF BREATH  . Alcohol Swabs (B-D SINGLE USE SWABS REGULAR) PADS Use to test blood sugar 3 times daily  . Blood Glucose Calibration (ACCU-CHEK AVIVA) SOLN Use as directed  . Blood Glucose Monitoring Suppl (ACCU-CHEK AVIVA PLUS) w/Device KIT Use to check blood sugar 3 times daily  . clonazePAM (KLONOPIN) 1 MG tablet Take 1 tablet (1 mg total) by mouth 2 (two) times daily as needed for anxiety.  . diazepam (VALIUM) 5 MG tablet SMARTSIG:1 Tablet(s) By Mouth  . Dulaglutide (TRULICITY) 1.5 LN/9.8XQ SOPN Inject 1.5 mg into the skin once a week.  . gabapentin (NEURONTIN) 400 MG capsule Take 400 capsules by mouth 3 (three) times daily.  Marland Kitchen glucose blood  (ACCU-CHEK AVIVA) test strip Use to test blood sugar 3 times daily. Dx: 11.8  . hydrochlorothiazide (HYDRODIURIL) 25 MG tablet TAKE 1 TABLET BY MOUTH EVERY DAY  . HYDROcodone-acetaminophen (NORCO) 7.5-325 MG tablet Take 1 tablet by mouth every 6 (six) hours as needed for moderate pain.  Marland Kitchen ipratropium (ATROVENT) 0.06 % nasal spray Place 2 sprays into both nostrils 4 (four) times daily. (Patient taking differently: Place 2 sprays into both nostrils 4 (four) times daily as needed (seasonal allergies.).)  . omeprazole (PRILOSEC) 40 MG capsule Take 1 capsule (40 mg total) by mouth daily.  . potassium chloride (KLOR-CON) 10 MEQ tablet Take 1 tablet (10 mEq total) by mouth daily.  . QUEtiapine (SEROQUEL) 100 MG tablet Take 0.5 tablets (50 mg total) by mouth at bedtime.  . triamcinolone (KENALOG) 0.1 % paste Use as directed 1 application in the mouth or throat 2 (two) times daily.     Allergies:   Hydromorphone, Morphine and related, and Penicillins   Past Medical History:  Diagnosis Date  . 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[place])   . Anxiety   . Arthritis   . Diabetes mellitus without complication ([place])   . GERD (gastroesophageal reflux disease)   . Heart murmur   . Hyperlipidemia   . Hypertension   . Neuromuscular disorder ([place])   . Palpitations   . Pneumonia   . PONV (postoperative nausea  and vomiting)   . PTSD (post-traumatic stress disorder)   . Ringing of ears, left     Past Surgical History:  Procedure Laterality Date  . ABDOMINAL HYSTERECTOMY     partial  . ACHILLES TENDON SURGERY Left   . APPENDECTOMY    . BACK SURGERY    . BLADDER SUSPENSION    . BREAST SURGERY Right 2003   milk duct removed  . CHOLECYSTECTOMY    . FRACTURE SURGERY     collar bone  . NOSE SURGERY     x 2   . OTHER SURGICAL HISTORY     Breast Duct removed  . PARTIAL HYSTERECTOMY    . RECTOCELE REPAIR    . TONSILLECTOMY    . VASCULAR SURGERY Right    right leg  . VEIN SURGERY Right    leg     Social  History:  The patient  reports that she has never smoked. She has never used smokeless tobacco. She reports that she does not drink alcohol and does not use drugs.   Family History:  The patient's family history includes Alzheimer's disease in her mother; Diabetes in her mother; Esophageal cancer in her brother; Lung cancer in her father.    ROS:  Please see the history of present illness. All other systems are reviewed and  Negative to the above problem except as noted.    PHYSICAL EXAM: VS:  BP (!) 142/78   Pulse (!) 52   Ht 5' 8"  (1.727 m)   Wt 188 lb (85.3 kg)   SpO2 98%   BMI 28.59 kg/m   GEN: Overweight 70 year old in no acute distress  HEENT: normal  Neck: no JVD, carotid bruits, Cardiac: RRR with frequent skips.  No murmurs noted.  No lower extremity edema  Respiratory:  clear to auscultation bilaterally, normal work of breathing GI: soft, nontender, nondistended, + BS  No hepatomegaly  MS: no deformity Moving all extremities   Skin: warm and dry, no rash Neuro:  Strength and sensation are intact Psych: euthymic mood, full affect   EKG:  EKG is not ordered today.  On 07/03/2020 sinus rhythm with occasional PVCs.  No point EKGs prior show PVCs and couplets.   Lipid Panel    Component Value Date/Time   CHOL 233 (H) 04/04/2019 1105   CHOL 183 12/16/2016 1517   TRIG (H) 04/04/2019 1105    464.0 Triglyceride is over 400; calculations on Lipids are invalid.   HDL 37.20 (L) 04/04/2019 1105   HDL 53 12/16/2016 1517   CHOLHDL 6 04/04/2019 1105   LDLCALC 84 12/16/2016 1517   LDLDIRECT 94.0 04/04/2019 1105      Wt Readings from Last 3 Encounters:  09/10/20 188 lb (85.3 kg)  09/06/20 185 lb (83.9 kg)  08/24/20 186 lb 9.6 oz (84.6 kg)      ASSESSMENT AND PLAN:  1  Bradycardia   The reported bradycardia may indeed be due to incorrect assessment given frequent ectopy    WIll set up for monitor  2  Palpitations: Patient with a long history of palpitations.  They do not  sound hemodynamically destabilizing.  Noticed on EKGs in the past she has frequent PVCs.  She has some skips on examination today.   I would recommend a 48-hour Holter monitor to quantify how much ectopy she is having  I would also set her up for an echocardiogram to evaluate ventricular function.  No changes for now.  2.  Preop evaluation.  Patient  with pending back surgery.  She does have palpitations and we will plan echo and monitor as noted above.  Depending on findings consider Myoview given long history of diabetes and neuropathy.  Unable to finalize cardiac risk assessment until testing is complete    F/U based on test results.   Current medicines are reviewed at length with the patient today.  The patient does not have concerns regarding medicines.  Signed, Dorris Carnes, MD  09/10/2020 10:58 AM    [facility] [place], [place], [place]  [address] Phone: [phone]; Fax: [phone]

## 2020-09-10 NOTE — Patient Instructions (Signed)
Medication Instructions:  Your physician recommends that you continue on your current medications as directed. Please refer to the Current Medication list given to you today.  *If you need a refill on your cardiac medications before your next appointment, please call your pharmacy*   Lab Work: NONE   If you have labs (blood work) drawn today and your tests are completely normal, you will receive your results only by: Marland Kitchen MyChart Message (if you have MyChart) OR . A paper copy in the mail If you have any lab test that is abnormal or we need to change your treatment, we will call you to review the results.   Testing/Procedures: Your physician has requested that you have an echocardiogram. Echocardiography is a painless test that uses sound waves to create images of your heart. It provides your doctor with information about the size and shape of your heart and how well your heart's chambers and valves are working. This procedure takes approximately one hour. There are no restrictions for this procedure.  ZIO XT- Long Term Monitor Instructions   Your physician has requested you wear your ZIO patch monitor_______days.   This is a single patch monitor.  [employer] supplies one patch monitor per enrollment.  Additional stickers are not available.   Please do not apply patch if you will be having a Nuclear Stress Test, Echocardiogram, Cardiac CT, MRI, or Chest Xray during the time frame you would be wearing the monitor. The patch cannot be worn during these tests.  You cannot remove and re-apply the ZIO XT patch monitor.   Your ZIO patch monitor will be sent USPS Priority mail from [facility] directly to your home address. The monitor may also be mailed to a PO BOX if home delivery is not available.   It may take 3-5 days to receive your monitor after you have been enrolled.   Once you have received you monitor, please review enclosed instructions.  Your monitor has already been registered  assigning a specific monitor serial # to you.   Applying the monitor   Shave hair from upper left chest.   Hold abrader disc by orange tab.  Rub abrader in 40 strokes over left upper chest as indicated in your monitor instructions.   Clean area with 4 enclosed alcohol pads .  Use all pads to assure are is cleaned thoroughly.  Let dry.   Apply patch as indicated in monitor instructions.  Patch will be place under collarbone on left side of chest with arrow pointing upward.   Rub patch adhesive wings for 2 minutes.Remove white label marked "1".  Remove white label marked "2".  Rub patch adhesive wings for 2 additional minutes.   While looking in a mirror, press and release button in center of patch.  A small green light will flash 3-4 times .  This will be your only indicator the monitor has been turned on.     Do not shower for the first 24 hours.  You may shower after the first 24 hours.   Press button if you feel a symptom. You will hear a small click.  Record Date, Time and Symptom in the Patient Log Book.   When you are ready to remove patch, follow instructions on last 2 pages of Patient Log Book.  Stick patch monitor onto last page of Patient Log Book.   Place Patient Log Book in [place] box.  Use locking tab on box and tape box closed securely.  The [place] and  White box has [employer] on it.  Please place in mailbox as soon as possible.  Your physician should have your test results approximately 7 days after the monitor has been mailed back to [facility]   Call [place] at [phone] if you have questions regarding your ZIO XT patch monitor.  Call them immediately if you see an orange light blinking on your monitor.   If your monitor falls off in less than 4 days contact our Monitor department at [phone].  If your monitor becomes loose or falls off after 4 days call [employer] at [phone] for suggestions on securing your monitor.    Follow-Up: At [facility], you and your health needs are our priority.  As part of our continuing mission to provide you with exceptional heart care, we have created designated Provider Care Teams.  These Care Teams include your primary Cardiologist (physician) and Advanced Practice Providers (APPs -  Physician Assistants and Nurse Practitioners) who all work together to provide you with the care you need, when you need it.  We recommend signing up for the patient portal called "MyChart".  Sign up information is provided on this After Visit Summary.  MyChart is used to connect with patients for Virtual Visits (Telemedicine).  Patients are able to view lab/test results, encounter notes, upcoming appointments, etc.  Non-urgent messages can be sent to your provider as well.   To learn more about what you can do with MyChart, go to [email or web address].    Your next appointment:    Pending test results   The format for your next appointment:   In Person  Provider:   Dorris Carnes, MD   Other Instructions Thank you for choosing [facility]!

## 2020-09-11 DIAGNOSIS — L570 Actinic keratosis: Secondary | ICD-10-CM | POA: Diagnosis not present

## 2020-09-11 DIAGNOSIS — Z1283 Encounter for screening for malignant neoplasm of skin: Secondary | ICD-10-CM | POA: Diagnosis not present

## 2020-09-11 DIAGNOSIS — Z85828 Personal history of other malignant neoplasm of skin: Secondary | ICD-10-CM | POA: Diagnosis not present

## 2020-09-11 DIAGNOSIS — L814 Other melanin hyperpigmentation: Secondary | ICD-10-CM | POA: Diagnosis not present

## 2020-09-20 DIAGNOSIS — R002 Palpitations: Secondary | ICD-10-CM | POA: Diagnosis not present

## 2020-09-24 ENCOUNTER — Encounter: Payer: Self-pay | Admitting: Family Medicine

## 2020-09-24 DIAGNOSIS — F32A Depression, unspecified: Secondary | ICD-10-CM

## 2020-09-24 MED ORDER — CLONAZEPAM 1 MG PO TABS
1.0000 mg | ORAL_TABLET | Freq: Two times a day (BID) | ORAL | 3 refills | Status: DC | PRN
Start: 2020-09-24 — End: 2021-02-27

## 2020-09-25 ENCOUNTER — Other Ambulatory Visit: Payer: Self-pay

## 2020-09-25 ENCOUNTER — Ambulatory Visit (HOSPITAL_COMMUNITY)
Admission: RE | Admit: 2020-09-25 | Discharge: 2020-09-25 | Disposition: A | Discharge status: Home / Self Care | Payer: Medicare Other | Source: Ambulatory Visit | Attending: Internal Medicine | Admitting: Internal Medicine

## 2020-09-25 DIAGNOSIS — I10 Essential (primary) hypertension: Secondary | ICD-10-CM

## 2020-09-25 DIAGNOSIS — E785 Hyperlipidemia, unspecified: Secondary | ICD-10-CM | POA: Diagnosis not present

## 2020-09-25 DIAGNOSIS — R002 Palpitations: Secondary | ICD-10-CM | POA: Diagnosis not present

## 2020-09-25 DIAGNOSIS — E119 Type 2 diabetes mellitus without complications: Secondary | ICD-10-CM | POA: Diagnosis not present

## 2020-09-25 LAB — ECHOCARDIOGRAM COMPLETE
AR max vel: 1.22 cm2
AV Area VTI: 1.35 cm2
AV Area mean vel: 1.31 cm2
AV Mean grad: 7.8 mmHg
AV Peak grad: 15.5 mmHg
Ao pk vel: 1.97 m/s
Area-P 1/2: 2.6 cm2
S' Lateral: 3.1 cm

## 2020-09-25 NOTE — Progress Notes (Signed)
*  PRELIMINARY RESULTS* Echocardiogram 2D Echocardiogram has been performed.  [name] 09/25/2020, 2:57 PM

## 2020-09-27 ENCOUNTER — Other Ambulatory Visit: Payer: Self-pay

## 2020-09-27 ENCOUNTER — Ambulatory Visit (INDEPENDENT_AMBULATORY_CARE_PROVIDER_SITE_OTHER): Payer: Medicare Other | Admitting: Orthopaedic Surgery

## 2020-09-27 ENCOUNTER — Encounter: Payer: Self-pay | Admitting: Orthopaedic Surgery

## 2020-09-27 VITALS — Ht 68.0 in | Wt 188.0 lb

## 2020-09-27 DIAGNOSIS — M48061 Spinal stenosis, lumbar region without neurogenic claudication: Secondary | ICD-10-CM

## 2020-09-27 DIAGNOSIS — G2581 Restless legs syndrome: Secondary | ICD-10-CM

## 2020-09-27 NOTE — Progress Notes (Signed)
Office Visit Note   Patient: [name]           Date of Birth: 1950-09-10           MRN: [id number] Visit Date: 09/27/2020              Requested by: Martinique, Betty G, MD [address] [place],  [place] [address] PCP: Martinique, Betty G, MD   Assessment & Plan: Visit Diagnoses:  1. Spinal stenosis of lumbar region, unspecified whether neurogenic claudication present   2. Restless leg syndrome     Plan: We reviewed her recent MRI scan discussed with her that her symptoms are likely related to narrowing in the lumbar spine and prolonged sitting position tends to be bothering her causing her to have to bend and straighten her knee move around some before she resumes driving.  Surgery level L2-3 looks good no residual compression but she does have progression at L4-5 and to lesser degree at L3-4.  Follow-Up Instructions: Return if symptoms worsen or fail to improve.   Orders:  No orders of the defined types were placed in this encounter.  No orders of the defined types were placed in this encounter.     Procedures: No procedures performed   Clinical Data: No additional findings.   Subjective: Chief Complaint  Patient presents with   Left Leg - Pain   Right Leg - Pain    HPI 70 year old female seen with 3-week history of anterior shin pain.  She states when she is palpated over the tibia she has felt some mild bumps.  Some increased pain when she pushes a gas pill and sometimes has to bend her knee or straighten her knee to get some relief.  She has a diagnosis of restless leg syndrome PTSD polyneuropathy, anxiety and depression.  No problems with stairs no neurogenic claudication symptoms.  On chronic narcotic medication for pain hydrocodone 7.5/325 120 tablets monthly.  Also on clonazepam.  PDMP reviewed.  Lumbar MRI 07/02/2020 showed L2-3 interbody fusion.  Progressive L4-5 spinal stenosis.  Disc extrusion inferior L5-S1 adjacent to the left S1 nerve root.  Mild  increase disc bulge L3-4.  Surgery with Dr. Saintclair Halsted October 2021 at the L2-3 level.  Review of Systems all other systems noncontributory other than mentioned in HPI.  Patient has some tenderness over the anterior tibia no palpable nodules.  Posterior tibial function is normal.  No severe pain with heel or toe walking.  Negative anterior drawer.  Well-healed lumbar incision.   Objective: Vital Signs: Ht 5\' 8"  (1.727 m)   Wt 188 lb (85.3 kg)   BMI 28.59 kg/m   Physical Exam Constitutional:      Appearance: She is well-developed.  HENT:     Head: Normocephalic.     Right Ear: External ear normal.     Left Ear: External ear normal. There is no impacted cerumen.  Eyes:     Pupils: Pupils are equal, round, and reactive to light.  Neck:     Thyroid: No thyromegaly.     Trachea: No tracheal deviation.  Cardiovascular:     Rate and Rhythm: Normal rate.  Pulmonary:     Effort: Pulmonary effort is normal.  Abdominal:     Palpations: Abdomen is soft.  Musculoskeletal:     Cervical back: No rigidity.  Skin:    General: Skin is warm and dry.  Neurological:     Mental Status: She is alert and oriented to person, place,  and time.  Psychiatric:        Behavior: Behavior normal.    Ortho Exam well-healed lumbar incision.  Negative logroll to the hips.  Knees reach full extension.  Reflexes are intact anterior tib EHL is intact.  Medial tibial crest not tender and normal posterior tibial resistive function.  She can heel and toe walk.  Specialty Comments:  MRI Cervical Spine: Multilevel degenerative changes most notably at C4-5. Left shoulder x-ray: Mild degenerative arthropathy of the glenohumeral & AC Joint.  Imaging: No results found.   PMFS History: Patient Active Problem List   Diagnosis Date Noted   Anxiety disorder, unspecified 01/27/2020   HNP (herniated nucleus pulposus), lumbar 01/13/2020   PTSD (post-traumatic stress disorder)    Polyneuropathy associated with underlying  disease ([place]) 07/26/2019   Allergic rhinitis 07/26/2019   Atherosclerosis of aorta ([place]) 04/04/2019   Myalgia due to statin 11/18/2017   Depression, major, recurrent, in partial remission ([place]) 10/16/2017   Insomnia 10/16/2017   Chronic pain syndrome 09/07/2017   Spinal stenosis of lumbar region 07/08/2017   DOE (dyspnea on exertion) 12/16/2016   Family history of early CAD 12/16/2016   Other chest pain 12/16/2016   Arthritis of left acromioclavicular joint 08/13/2016   Left shoulder pain 05/12/2016   Cervical spondylosis without myelopathy 05/12/2016   Rotator cuff syndrome, left 05/12/2016   Restless leg syndrome 05/12/2016   Primary osteoarthritis, left hand 05/12/2016   MELENA 10/18/2008   GASTROPARESIS 05/30/2008   Type 2 diabetes mellitus with neurological complications ([place]) 32/44/0102   HYPERCHOLESTEROLEMIA 05/04/2008   HYPOKALEMIA 05/04/2008   GERD 05/04/2008   Constipation 05/04/2008   HEMATEMESIS 05/04/2008   Essential hypertension 05/04/2008   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[place])    Anxiety    Arthritis    Diabetes mellitus without complication (HCC)    GERD (gastroesophageal reflux disease)    Heart murmur    Hyperlipidemia    Hypertension    Neuromuscular disorder (HCC)    Palpitations    Pneumonia    PONV (postoperative nausea and vomiting)    PTSD (post-traumatic stress disorder)    Ringing of ears, left     Family History  Problem Relation Age of Onset   Diabetes Mother    Alzheimer's disease Mother    Lung cancer Father    Esophageal cancer Brother    Colon cancer Neg Hx     Past Surgical History:  Procedure Laterality Date   ABDOMINAL HYSTERECTOMY     partial   ACHILLES TENDON SURGERY Left    APPENDECTOMY     BACK SURGERY     BLADDER SUSPENSION     BREAST SURGERY Right 2003   milk duct removed   CHOLECYSTECTOMY     FRACTURE SURGERY     collar bone   NOSE SURGERY     x 2    OTHER SURGICAL HISTORY      Breast Duct removed   PARTIAL HYSTERECTOMY     RECTOCELE REPAIR     TONSILLECTOMY     VASCULAR SURGERY Right    right leg   VEIN SURGERY Right    leg   Social History   Occupational History   Not on file  Tobacco Use   Smoking status: Never   Smokeless tobacco: Never  Vaping Use   Vaping Use: Never used  Substance and Sexual Activity   Alcohol use: No   Drug use: No   Sexual activity:  Not on file

## 2020-10-01 ENCOUNTER — Telehealth: Payer: Self-pay

## 2020-10-01 DIAGNOSIS — M5416 Radiculopathy, lumbar region: Secondary | ICD-10-CM | POA: Diagnosis not present

## 2020-10-01 DIAGNOSIS — I998 Other disorder of circulatory system: Secondary | ICD-10-CM

## 2020-10-01 NOTE — Telephone Encounter (Signed)
Pt notified. Pt had questions about what a Lexiscan Myoview is and we discussed that together.

## 2020-10-01 NOTE — Telephone Encounter (Signed)
-----   Message from Fay Records, MD sent at 10/01/2020  9:17 AM EDT ----- Reviewed monitor results    I would like to see more strips of recordings for PVCs Patient should have lexiscan myoview to r/o ischemia

## 2020-10-11 ENCOUNTER — Encounter: Payer: Medicare Other | Attending: Physical Medicine & Rehabilitation | Admitting: Registered Nurse

## 2020-10-11 ENCOUNTER — Encounter: Payer: Self-pay | Admitting: Registered Nurse

## 2020-10-11 ENCOUNTER — Other Ambulatory Visit: Payer: Self-pay

## 2020-10-11 VITALS — BP 166/78 | HR 87 | Temp 98.7°F | Ht 68.0 in | Wt 180.2 lb

## 2020-10-11 DIAGNOSIS — M5416 Radiculopathy, lumbar region: Secondary | ICD-10-CM

## 2020-10-11 DIAGNOSIS — M47812 Spondylosis without myelopathy or radiculopathy, cervical region: Secondary | ICD-10-CM | POA: Diagnosis not present

## 2020-10-11 DIAGNOSIS — Z79891 Long term (current) use of opiate analgesic: Secondary | ICD-10-CM

## 2020-10-11 DIAGNOSIS — Z5181 Encounter for therapeutic drug level monitoring: Secondary | ICD-10-CM | POA: Diagnosis not present

## 2020-10-11 DIAGNOSIS — G894 Chronic pain syndrome: Secondary | ICD-10-CM | POA: Diagnosis not present

## 2020-10-11 DIAGNOSIS — M5412 Radiculopathy, cervical region: Secondary | ICD-10-CM | POA: Diagnosis not present

## 2020-10-11 DIAGNOSIS — M542 Cervicalgia: Secondary | ICD-10-CM | POA: Diagnosis not present

## 2020-10-11 MED ORDER — HYDROCODONE-ACETAMINOPHEN 7.5-325 MG PO TABS: 1.0000 | ORAL_TABLET | Freq: Four times a day (QID) | ORAL | 0 refills | Status: DC | PRN

## 2020-10-11 NOTE — Progress Notes (Signed)
4  Subjective:    Patient ID: [name], female    DOB: [birth year], 70 y.o.   MRN: [id number]  HPI: [name] is a 70 y.o. female who returns for follow up appointment for chronic pain and medication refill. She states her  pain is located in  her neck radiating into her left shoulder and lower back pain radiating into her bilateral lower extremities. She rates her pain 8. Her current exercise regime is walking and performing stretching exercises.  Ms. Helbig would like to have an appointment with Dr Naaman Plummer regarding EMG, she states it based on what Dr Saintclair Halsted was discussing with her. She will be scheduled with Dr Naaman Plummer, she verbalizes understanding.   Ms. Gunnoe Morphine equivalent is 30.00  MME.   Last Oral Swab was Performed on 08/14/2020, it was consistent.    Pain Inventory Average Pain 9 Pain Right Now 8 My pain is sharp, burning, tingling, and aching  In the last 24 hours, has pain interfered with the following? General activity 7 Relation with others 7 Enjoyment of life 6 What TIME of day is your pain at its worst? morning , evening, and night Sleep (in general) Poor  Pain is worse with: walking, bending, sitting, inactivity, and standing Pain improves with: rest, heat/ice, therapy/exercise, pacing activities, medication, and injections Relief from Meds: 10  Family History  Problem Relation Age of Onset   Diabetes Mother    Alzheimer's disease Mother    Lung cancer Father    Esophageal cancer Brother    Colon cancer Neg Hx    Social History   Socioeconomic History   Marital status: Divorced    Spouse name: Not on file   Number of children: Not on file   Years of education: Not on file   Highest education level: Not on file  Occupational History   Not on file  Tobacco Use   Smoking status: Never   Smokeless tobacco: Never  Vaping Use   Vaping Use: Never used  Substance and Sexual Activity   Alcohol use: No   Drug use: No   Sexual activity:  Not on file  Other Topics Concern   Not on file  Social History Narrative   Not on file   Social Determinants of Health   Financial Resource Strain: Not on file  Food Insecurity: Not on file  Transportation Needs: Not on file  Physical Activity: Not on file  Stress: Not on file  Social Connections: Not on file   Past Surgical History:  Procedure Laterality Date   ABDOMINAL HYSTERECTOMY     partial   ACHILLES TENDON SURGERY Left    APPENDECTOMY     BACK SURGERY     BLADDER SUSPENSION     BREAST SURGERY Right 2003   milk duct removed   CHOLECYSTECTOMY     FRACTURE SURGERY     collar bone   NOSE SURGERY     x 2    OTHER SURGICAL HISTORY     Breast Duct removed   PARTIAL HYSTERECTOMY     RECTOCELE REPAIR     TONSILLECTOMY     VASCULAR SURGERY Right    right leg   VEIN SURGERY Right    leg   Past Surgical History:  Procedure Laterality Date   ABDOMINAL HYSTERECTOMY     partial   ACHILLES TENDON SURGERY Left    APPENDECTOMY     BACK SURGERY     BLADDER SUSPENSION  BREAST SURGERY Right 2003   milk duct removed   CHOLECYSTECTOMY     FRACTURE SURGERY     collar bone   NOSE SURGERY     x 2    OTHER SURGICAL HISTORY     Breast Duct removed   PARTIAL HYSTERECTOMY     RECTOCELE REPAIR     TONSILLECTOMY     VASCULAR SURGERY Right    right leg   VEIN SURGERY Right    leg   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HCC)    Anxiety    Arthritis    Diabetes mellitus without complication (HCC)    GERD (gastroesophageal reflux disease)    Heart murmur    Hyperlipidemia    Hypertension    Neuromuscular disorder (HCC)    Palpitations    Pneumonia    PONV (postoperative nausea and vomiting)    PTSD (post-traumatic stress disorder)    Ringing of ears, left    BP (!) 166/78 (BP Location: Right Arm, Patient Position: Sitting, Cuff Size: Small)   Pulse 87   Temp 98.7 F (37.1 C) (Oral)   Ht 5\' 8"  (1.727 m)   Wt 180 lb 3.2 oz (81.7 kg)    SpO2 96%   BMI 27.40 kg/m   Opioid Risk Score:   Fall Risk Score:  `1  Depression screen PHQ 2/9  Depression screen [facility] 2/9 08/24/2020 04/10/2020 12/30/2019 12/29/2019 11/04/2019 08/25/2019 04/07/2019  Decreased Interest 2 1 1 2 2 2  0  Down, Depressed, Hopeless 2 1 1 2 2 2  0  PHQ - 2 Score 4 2 2 4 4 4  0  Altered sleeping 3 - - 3 - 3 1  Tired, decreased energy 2 - - 2 - 2 1  Change in appetite 1 - - 2 - 2 1  Feeling bad or failure about yourself  0 - - 1 - 2 0  Trouble concentrating 0 - - 0 - 2 0  Moving slowly or fidgety/restless 0 - - 0 - 2 0  Suicidal thoughts 0 - - 0 - 0 0  PHQ-9 Score 10 - - 12 - 17 3  Difficult doing work/chores Very difficult - - Somewhat difficult - Very difficult Somewhat difficult  Some recent data might be hidden      Review of Systems  Constitutional: Negative.   HENT: Negative.    Eyes: Negative.   Respiratory: Negative.    Cardiovascular: Negative.   Gastrointestinal: Negative.   Musculoskeletal:  Positive for back pain and gait problem.       Right and left foot pain  Skin: Negative.       Objective:   Physical Exam Vitals and nursing note reviewed.  Constitutional:      Appearance: Normal appearance.  Neck:     Comments: Cervical Paraspinal Tenderness: C-5-C-6 Cardiovascular:     Rate and Rhythm: Normal rate and regular rhythm.     Pulses: Normal pulses.     Heart sounds: Normal heart sounds.  Pulmonary:     Effort: Pulmonary effort is normal.     Breath sounds: Normal breath sounds.  Musculoskeletal:     Cervical back: Normal range of motion and neck supple.     Comments: Normal Muscle Bulk and Muscle Testing Reveals:  Upper Extremities: Full ROM and Muscle Strength  5/5 Left AC Joint Tenderness Thoracic Paraspinal Tenderness: T-1-T-3 Mainly Left Side Lumbar Paraspinal Tenderness: L-3-L-5  Lower Extremities: Full ROM and Muscle Strength  5/5 Arises From Table with ease Narrow Based  Gait     Skin:    General: Skin is warm and  dry.  Neurological:     Mental Status: She is alert and oriented to person, place, and time.  Psychiatric:        Mood and Affect: Mood normal.        Behavior: Behavior normal.         Assessment & Plan:  1.Chronic cervicalgia with documented spondylosis on MRI, per Dr. Naaman Plummer Note and Cervical Radiculitis: Continue Gabapentin,  Continue HEP as Tolerated. 10/11/2020 2. Left shoulder pain most consistent with left rotator cuff syndrome. Mild DJD.  Continue HEP as tolerated.Continue to monitor. 10/11/2020. Refilled: Hydrocodone 7.5/325 mg one tablet every 6 hours as needed for pain. #120. Second script sent for the following month. We will continue the opioid monitoring program, this consists of regular clinic visits, examinations, urine drug screen, pill counts as well as use of [place] Controlled Substance Reporting system. A 12 month History has been reviewed on the [place] on 10/11/2020. 3. Left wrist/finger pain most consistent with OA, ?post traumatic. No complaints today. 10/11/2020 4. Anxiety: Continue Klonopin, PCP prescribing. Continue to monitor. 10/11/2020 5. Restless Leg syndrome: Continue Gabapentin. 10/11/2020 7. Sacroiliac inflammation: S/P Right  SI injection on 09/20/2019, with No relief noted. Ms. Pippins seen Dr Marlou Sa in regards to her pain. Dr Saintclair Halsted. Following.  Continue to Monitor. 10/11/2020. 8. Myofascial Pain: Continue Robaxin as needed. 10/11/2020.  9. Bilateral feet with neuropathic pain: Continue Gabapentin. Continue to monitor. 10/11/2020. 10.  Herniated Nucleus Pulposus L2-3 with recurrent disc rupture: S/P  Posterior Lumbar Interbody Fusion  - Lumbar two-Lumbar three on 01/13/2020 by Dr Saintclair Halsted.  Dr Saintclair Halsted Following. 10/11/2020   F/U in 2 months

## 2020-10-15 ENCOUNTER — Encounter: Payer: Medicare Other | Admitting: Registered Nurse

## 2020-10-19 ENCOUNTER — Encounter: Payer: Self-pay | Admitting: Family Medicine

## 2020-10-30 ENCOUNTER — Telehealth: Payer: Self-pay | Admitting: Family Medicine

## 2020-10-30 DIAGNOSIS — G629 Polyneuropathy, unspecified: Secondary | ICD-10-CM | POA: Diagnosis not present

## 2020-10-30 DIAGNOSIS — M5440 Lumbago with sciatica, unspecified side: Secondary | ICD-10-CM | POA: Diagnosis not present

## 2020-10-30 DIAGNOSIS — I10 Essential (primary) hypertension: Secondary | ICD-10-CM | POA: Diagnosis not present

## 2020-10-30 DIAGNOSIS — Z6828 Body mass index (BMI) 28.0-28.9, adult: Secondary | ICD-10-CM | POA: Diagnosis not present

## 2020-10-30 NOTE — Telephone Encounter (Signed)
Left message for patient to call back and schedule Medicare Annual Wellness Visit (AWV) either virtually or in office.   Last AWV 04/04/2019  please schedule at anytime with [facility] Nurse Health Advisor 1 or 2   This should be a 45 minute visit.

## 2020-11-01 ENCOUNTER — Other Ambulatory Visit: Payer: Self-pay | Admitting: Family Medicine

## 2020-11-01 ENCOUNTER — Encounter: Payer: Self-pay | Admitting: Family Medicine

## 2020-11-01 DIAGNOSIS — J301 Allergic rhinitis due to pollen: Secondary | ICD-10-CM

## 2020-11-02 MED ORDER — IPRATROPIUM BROMIDE 0.06 % NA SOLN: 2.0000 | Freq: Four times a day (QID) | NASAL | 3 refills | Status: DC

## 2020-11-05 ENCOUNTER — Encounter (HOSPITAL_COMMUNITY): Payer: Medicare Other

## 2020-11-05 ENCOUNTER — Encounter (HOSPITAL_COMMUNITY): Payer: Self-pay

## 2020-11-05 ENCOUNTER — Telehealth: Payer: Self-pay | Admitting: Internal Medicine

## 2020-11-05 NOTE — Telephone Encounter (Signed)
Patient was schedule for a Nuc Stress test today and she called and cancelled, she would like a call back from either Dr [name] or Nurse, she has not heard anything good about this test and would like to discuss this with Dr [name].

## 2020-11-06 ENCOUNTER — Ambulatory Visit (INDEPENDENT_AMBULATORY_CARE_PROVIDER_SITE_OTHER): Payer: Medicare Other

## 2020-11-06 DIAGNOSIS — Z0000 Encounter for general adult medical examination without abnormal findings: Secondary | ICD-10-CM | POA: Diagnosis not present

## 2020-11-06 DIAGNOSIS — Z1231 Encounter for screening mammogram for malignant neoplasm of breast: Secondary | ICD-10-CM | POA: Diagnosis not present

## 2020-11-06 NOTE — Telephone Encounter (Signed)
Spoke with pt who states that after talking to friends she became scared and decided to talk with [name] before having the test done. Pt states that she works daily from 10a-2p. Please advise.

## 2020-11-06 NOTE — Patient Instructions (Signed)
[name] , Thank you for taking time to come for your Medicare Wellness Visit. I appreciate your ongoing commitment to your health goals. Please review the following plan we discussed and let me know if I can assist you in the future.   Screening recommendations/referrals: Colonoscopy: 02/18/2017 Mammogram: referral 11/06/2020 Bone Density: 05/13/2019 Recommended yearly ophthalmology/optometry visit for glaucoma screening and checkup Recommended yearly dental visit for hygiene and checkup  Vaccinations: Influenza vaccine: due in fall 2022  Pneumococcal vaccine: completed series  Tdap vaccine: due with injury  Shingles vaccine: completed series     Advanced directives: will provide copies   Conditions/risks identified: none   Next appointment: none    Preventive Care 9 Years and Older, Female Preventive care refers to lifestyle choices and visits with your health care provider that can promote health and wellness. What does preventive care include? A yearly physical exam. This is also called an annual well check. Dental exams once or twice a year. Routine eye exams. Ask your health care provider how often you should have your eyes checked. Personal lifestyle choices, including: Daily care of your teeth and gums. Regular physical activity. Eating a healthy diet. Avoiding tobacco and drug use. Limiting alcohol use. Practicing safe sex. Taking low-dose aspirin every day. Taking vitamin and mineral supplements as recommended by your health care provider. What happens during an annual well check? The services and screenings done by your health care provider during your annual well check will depend on your age, overall health, lifestyle risk factors, and family history of disease. Counseling  Your health care provider may ask you questions about your: Alcohol use. Tobacco use. Drug use. Emotional well-being. Home and relationship well-being. Sexual activity. Eating  habits. History of falls. Memory and ability to understand (cognition). Work and work Statistician. Reproductive health. Screening  You may have the following tests or measurements: Height, weight, and BMI. Blood pressure. Lipid and cholesterol levels. These may be checked every 5 years, or more frequently if you are over 75 years old. Skin check. Lung cancer screening. You may have this screening every year starting at age 44 if you have a 30-pack-year history of smoking and currently smoke or have quit within the past 15 years. Fecal occult blood test (FOBT) of the stool. You may have this test every year starting at age 21. Flexible sigmoidoscopy or colonoscopy. You may have a sigmoidoscopy every 5 years or a colonoscopy every 10 years starting at age 75. Hepatitis C blood test. Hepatitis B blood test. Sexually transmitted disease (STD) testing. Diabetes screening. This is done by checking your blood sugar (glucose) after you have not eaten for a while (fasting). You may have this done every 1-3 years. Bone density scan. This is done to screen for osteoporosis. You may have this done starting at age 45. Mammogram. This may be done every 1-2 years. Talk to your health care provider about how often you should have regular mammograms. Talk with your health care provider about your test results, treatment options, and if necessary, the need for more tests. Vaccines  Your health care provider may recommend certain vaccines, such as: Influenza vaccine. This is recommended every year. Tetanus, diphtheria, and acellular pertussis (Tdap, Td) vaccine. You may need a Td booster every 10 years. Zoster vaccine. You may need this after age 91. Pneumococcal 13-valent conjugate (PCV13) vaccine. One dose is recommended after age 84. Pneumococcal polysaccharide (PPSV23) vaccine. One dose is recommended after age 35. Talk to your health care provider  about which screenings and vaccines you need and how  often you need them. This information is not intended to replace advice given to you by your health care provider. Make sure you discuss any questions you have with your health care provider. Document Released: 04/20/2015 Document Revised: 12/12/2015 Document Reviewed: 01/23/2015 Elsevier Interactive Patient Education  2017 [place] Prevention in the Home Falls can cause injuries. They can happen to people of all ages. There are many things you can do to make your home safe and to help prevent falls. What can I do on the outside of my home? Regularly fix the edges of walkways and driveways and fix any cracks. Remove anything that might make you trip as you walk through a door, such as a raised step or threshold. Trim any bushes or trees on the path to your home. Use bright outdoor lighting. Clear any walking paths of anything that might make someone trip, such as rocks or tools. Regularly check to see if handrails are loose or broken. Make sure that both sides of any steps have handrails. Any raised decks and porches should have guardrails on the edges. Have any leaves, snow, or ice cleared regularly. Use sand or salt on walking paths during winter. Clean up any spills in your garage right away. This includes oil or grease spills. What can I do in the bathroom? Use night lights. Install grab bars by the toilet and in the tub and shower. Do not use towel bars as grab bars. Use non-skid mats or decals in the tub or shower. If you need to sit down in the shower, use a plastic, non-slip stool. Keep the floor dry. Clean up any water that spills on the floor as soon as it happens. Remove soap buildup in the tub or shower regularly. Attach bath mats securely with double-sided non-slip rug tape. Do not have throw rugs and other things on the floor that can make you trip. What can I do in the bedroom? Use night lights. Make sure that you have a light by your bed that is easy to  reach. Do not use any sheets or blankets that are too big for your bed. They should not hang down onto the floor. Have a firm chair that has side arms. You can use this for support while you get dressed. Do not have throw rugs and other things on the floor that can make you trip. What can I do in the kitchen? Clean up any spills right away. Avoid walking on wet floors. Keep items that you use a lot in easy-to-reach places. If you need to reach something above you, use a strong step stool that has a grab bar. Keep electrical cords out of the way. Do not use floor polish or wax that makes floors slippery. If you must use wax, use non-skid floor wax. Do not have throw rugs and other things on the floor that can make you trip. What can I do with my stairs? Do not leave any items on the stairs. Make sure that there are handrails on both sides of the stairs and use them. Fix handrails that are broken or loose. Make sure that handrails are as long as the stairways. Check any carpeting to make sure that it is firmly attached to the stairs. Fix any carpet that is loose or worn. Avoid having throw rugs at the top or bottom of the stairs. If you do have throw rugs, attach them to the floor  with carpet tape. Make sure that you have a light switch at the top of the stairs and the bottom of the stairs. If you do not have them, ask someone to add them for you. What else can I do to help prevent falls? Wear shoes that: Do not have high heels. Have rubber bottoms. Are comfortable and fit you well. Are closed at the toe. Do not wear sandals. If you use a stepladder: Make sure that it is fully opened. Do not climb a closed stepladder. Make sure that both sides of the stepladder are locked into place. Ask someone to hold it for you, if possible. Clearly mark and make sure that you can see: Any grab bars or handrails. First and last steps. Where the edge of each step is. Use tools that help you move  around (mobility aids) if they are needed. These include: Canes. Walkers. Scooters. Crutches. Turn on the lights when you go into a dark area. Replace any light bulbs as soon as they burn out. Set up your furniture so you have a clear path. Avoid moving your furniture around. If any of your floors are uneven, fix them. If there are any pets around you, be aware of where they are. Review your medicines with your doctor. Some medicines can make you feel dizzy. This can increase your chance of falling. Ask your doctor what other things that you can do to help prevent falls. This information is not intended to replace advice given to you by your health care provider. Make sure you discuss any questions you have with your health care provider. Document Released: 01/18/2009 Document Revised: 08/30/2015 Document Reviewed: 04/28/2014 Elsevier Interactive Patient Education  2017 [employer].

## 2020-11-06 NOTE — Progress Notes (Signed)
Subjective:   [name] is a 70 y.o. female who presents for an Initial Medicare Annual Wellness Visit.   I connected with [employer] today by telephone and verified that I am speaking with the correct person using two identifiers. Location patient: home Location provider: work Persons participating in the virtual visit: patient, provider.   I discussed the limitations, risks, security and privacy concerns of performing an evaluation and management service by telephone and the availability of in person appointments. I also discussed with the patient that there may be a patient responsible charge related to this service. The patient expressed understanding and verbally consented to this telephonic visit.    Interactive audio and video telecommunications were attempted between this provider and patient, however failed, due to patient having technical difficulties OR patient did not have access to video capability.  We continued and completed visit with audio only.    Review of Systems    N/a        Objective:    There were no vitals filed for this visit. There is no height or weight on file to calculate BMI.  Advanced Directives 07/01/2020 01/13/2020 01/10/2020 03/09/2018 03/04/2018 11/10/2017 01/14/2017  Does Patient Have a Medical Advance Directive? No Yes Yes No No No No  Type of Advance Directive - Living will Living will - - - -  Does patient want to make changes to medical advance directive? - No - Patient declined - - - - -  Would patient like information on creating a medical advance directive? - - - No - Patient declined - No - Patient declined -    Current Medications (verified) Outpatient Encounter Medications as of 11/06/2020  Medication Sig   ACCU-CHEK SOFTCLIX LANCETS lancets Use to test blood sugar 3 times daily. Dx: E11.8   albuterol (VENTOLIN HFA) 108 (90 Base) MCG/ACT inhaler INHALE 2 PUFFS INTO THE LUNGS EVERY 6 HOURS AS NEEDED FOR WHEEZING OR SHORTNESS OF  BREATH   Alcohol Swabs (B-D SINGLE USE SWABS REGULAR) PADS Use to test blood sugar 3 times daily   Blood Glucose Calibration (ACCU-CHEK AVIVA) SOLN Use as directed   Blood Glucose Monitoring Suppl (ACCU-CHEK AVIVA PLUS) w/Device KIT Use to check blood sugar 3 times daily   clonazePAM (KLONOPIN) 1 MG tablet Take 1 tablet (1 mg total) by mouth 2 (two) times daily as needed for anxiety.   Dulaglutide (TRULICITY) 1.5 BS/4.9QP SOPN Inject 1.5 mg into the skin once a week.   gabapentin (NEURONTIN) 400 MG capsule Take 400 capsules by mouth 3 (three) times daily.   glucose blood (ACCU-CHEK AVIVA) test strip Use to test blood sugar 3 times daily. Dx: 11.8   hydrochlorothiazide (HYDRODIURIL) 25 MG tablet TAKE 1 TABLET BY MOUTH EVERY DAY   HYDROcodone-acetaminophen (NORCO) 7.5-325 MG tablet Take 1 tablet by mouth every 6 (six) hours as needed for moderate pain.   ipratropium (ATROVENT) 0.06 % nasal spray Place 2 sprays into both nostrils 4 (four) times daily.   omeprazole (PRILOSEC) 40 MG capsule Take 1 capsule (40 mg total) by mouth daily.   potassium chloride (KLOR-CON) 10 MEQ tablet Take 1 tablet (10 mEq total) by mouth daily.   QUEtiapine (SEROQUEL) 100 MG tablet Take 0.5 tablets (50 mg total) by mouth at bedtime.   triamcinolone (KENALOG) 0.1 % paste Use as directed 1 application in the mouth or throat 2 (two) times daily.   No facility-administered encounter medications on file as of 11/06/2020.    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[place])    Anxiety    Arthritis    Diabetes mellitus without complication (HCC)    GERD (gastroesophageal reflux disease)    Heart murmur    Hyperlipidemia    Hypertension    Neuromuscular disorder (HCC)    Palpitations    Pneumonia    PONV (postoperative nausea and vomiting)    PTSD (post-traumatic stress disorder)    Ringing of ears, left    Past  Surgical History:  Procedure Laterality Date   ABDOMINAL HYSTERECTOMY     partial   ACHILLES TENDON SURGERY Left    APPENDECTOMY     BACK SURGERY     BLADDER SUSPENSION     BREAST SURGERY Right 2003   milk duct removed   CHOLECYSTECTOMY     FRACTURE SURGERY     collar bone   NOSE SURGERY     x 2    OTHER SURGICAL HISTORY     Breast Duct removed   PARTIAL HYSTERECTOMY     RECTOCELE REPAIR     TONSILLECTOMY     VASCULAR SURGERY Right    right leg   VEIN SURGERY Right    leg   Family History  Problem Relation Age of Onset   Diabetes Mother    Alzheimer's disease Mother    Lung cancer Father    Esophageal cancer Brother    Colon cancer Neg Hx    Social History   Socioeconomic History   Marital status: Divorced    Spouse name: Not on file   Number of children: Not on file   Years of education: Not on file   Highest education level: Not on file  Occupational History   Not on file  Tobacco Use   Smoking status: Never   Smokeless tobacco: Never  Vaping Use   Vaping Use: Never used  Substance and Sexual Activity   Alcohol use: No   Drug use: No   Sexual activity: Not on file  Other Topics Concern   Not on file  Social History Narrative   Not on file   Social Determinants of Health   Financial Resource Strain: Not on file  Food Insecurity: Not on file  Transportation Needs: Not on file  Physical Activity: Not on file  Stress: Not on file  Social Connections: Not on file    Tobacco Counseling Counseling given: Not Answered   Clinical Intake:                 Diabetic?yes   Nutrition Risk Assessment:  Has the patient had any N/V/D within the last 2 months?  No  Does the patient have any non-healing wounds?  No  Has the patient had any unintentional weight loss or weight gain?  No   Diabetes:  Is the patient diabetic?  Yes  If diabetic, was a CBG obtained today?  No  Did the patient bring in their glucometer from home?  No  How often  do you monitor your CBG's? 2 x day .   Financial Strains and Diabetes Management:  Are you having any financial strains with the device, your supplies or your medication? No .  Does the patient want to be seen by Chronic Care Management for management of their diabetes?  No  Would the patient like to be referred to a Nutritionist or for Diabetic Management?  No   Diabetic  Exams:  Diabetic Eye Exam: Completed 11/15/2020 Diabetic Foot Exam: Overdue, Pt has been advised about the importance in completing this exam. Pt is scheduled for diabetic foot exam on next office visit .          Activities of Daily Living In your present state of health, do you have any difficulty performing the following activities: 01/13/2020 01/10/2020  Hearing? N N  Vision? N N  Difficulty concentrating or making decisions? N N  Walking or climbing stairs? Y Y  Dressing or bathing? Y N  Doing errands, shopping? N N  Some recent data might be hidden    Patient Care Team: [place], Betty G, MD as PCP - General (Family Medicine) Fay Records, MD as PCP - Cardiology (Cardiology) Earnie Larsson, [facility] as Pharmacist (Pharmacist)  Indicate any recent Medical Services you may have received from other than [facility] providers in the past year (date may be approximate).     Assessment:   This is a routine wellness examination for [name].  Hearing/Vision screen No results found.  Dietary issues and exercise activities discussed:     Goals Addressed   None    Depression Screen PHQ 2/9 Scores 10/11/2020 08/24/2020 04/10/2020 12/30/2019 12/29/2019 11/04/2019 08/25/2019  PHQ - 2 Score 2 4 2 2 4 4 4   PHQ- 9 Score - 10 - - 12 - 17    Fall Risk Fall Risk  10/11/2020 08/14/2020 06/05/2020 04/10/2020 12/30/2019  Falls in the past year? 0 0 0 0 0  Comment - - - - -  Number falls in past yr: 0 - - 0 0  Comment - - - - -  Injury with Fall? 0 - - 0 0  Comment - - - - -  Risk for fall due to : - - - - -  Follow up - - - -  Education provided    [place]:  Any stairs in or around the home? No  If so, are there any without handrails? No  Home free of loose throw rugs in walkways, pet beds, electrical cords, etc? No  Adequate lighting in your home to reduce risk of falls? No   ASSISTIVE DEVICES UTILIZED TO PREVENT FALLS:  Life alert? No  Use of a cane, walker or w/c? No  Grab bars in the bathroom? No  Shower chair or bench in shower? No  Elevated toilet seat or a handicapped toilet? No    Cognitive Function:    Normal cognitive status assessed by direct observation by this Nurse Health Advisor. No abnormalities found.      Immunizations Immunization History  Administered Date(s) Administered   Influenza Split 01/28/2012, 04/07/2012   Influenza, Quadrivalent, Recombinant, Inj, Pf 12/18/2018   Influenza,inj,Quad PF,6+ Mos 12/29/2016   Influenza-Unspecified 02/03/2013, 03/06/2020   Pneumococcal Conjugate-13 01/13/2017   Pneumococcal Polysaccharide-23 11/17/2018   Zoster Recombinat (Shingrix) 12/10/2018   Zoster, Live 03/15/2015, 03/24/2015    TDAP status: Due, Education has been provided regarding the importance of this vaccine. Advised may receive this vaccine at local pharmacy or Health Dept. Aware to provide a copy of the vaccination record if obtained from local pharmacy or Health Dept. Verbalized acceptance and understanding.  Flu Vaccine status: Up to date  Pneumococcal vaccine status: Up to date  Covid-19 vaccine status: Completed vaccines  Qualifies for Shingles Vaccine? Yes   Zostavax completed No   Shingrix Completed?: No.    Education has been provided regarding the importance of this  vaccine. Patient has been advised to call insurance company to determine out of pocket expense if they have not yet received this vaccine. Advised may also receive vaccine at local pharmacy or Health Dept. Verbalized acceptance and understanding.  Screening  Tests Health Maintenance  Topic Date Due   COVID-19 Vaccine (1) Never done   Zoster Vaccines- Shingrix (2 of 2) 02/04/2019   INFLUENZA VACCINE  11/05/2020   URINE MICROALBUMIN  12/26/2020   HEMOGLOBIN A1C  11/15/2020   OPHTHALMOLOGY EXAM  11/29/2020   FOOT EXAM  02/16/2021   MAMMOGRAM  05/18/2021   COLONOSCOPY (Pts 45-65yr Insurance coverage will need to be confirmed)  02/18/2022   DEXA SCAN  Completed   Hepatitis C Screening  Completed   PNA vac Low Risk Adult  Completed   HPV VACCINES  Aged Out   TETANUS/TDAP  Discontinued    Health Maintenance  Health Maintenance Due  Topic Date Due   COVID-19 Vaccine (1) Never done   Zoster Vaccines- Shingrix (2 of 2) 02/04/2019   INFLUENZA VACCINE  11/05/2020   URINE MICROALBUMIN  12/26/2020    Colorectal cancer screening: Type of screening: Colonoscopy. Completed 02/18/2017. Repeat every 5 years  Mammogram status: Ordered 11/06/2020. Pt provided with contact info and advised to call to schedule appt.   Bone Density status: Completed 05/13/2019. Results reflect: Bone density results: OSTEOPENIA. Repeat every 5 years.  Lung Cancer Screening: (Low Dose CT Chest recommended if Age 237-80years, 30 pack-year currently smoking OR have quit w/in 15years.) does not qualify.   Lung Cancer Screening Referral: n/a  Additional Screening:  Hepatitis C Screening: does not qualify; Completed 09/11/2015  Vision Screening: Recommended annual ophthalmology exams for early detection of glaucoma and other disorders of the eye. Is the patient up to date with their annual eye exam?  Yes  Who is the provider or what is the name of the office in which the patient attends annual eye exams? [facility] If pt is not established with a provider, would they like to be referred to a provider to establish care? No .   Dental Screening: Recommended annual dental exams for proper oral hygiene  Community Resource Referral / Chronic Care Management: CRR  required this visit?  No   CCM required this visit?  No      Plan:     I have personally reviewed and noted the following in the patient's chart:   Medical and social history Use of alcohol, tobacco or illicit drugs  Current medications and supplements including opioid prescriptions. Patient is not currently taking opioid prescriptions. Functional ability and status Nutritional status Physical activity Advanced directives List of other physicians Hospitalizations, surgeries, and ER visits in previous 12 months Vitals Screenings to include cognitive, depression, and falls Referrals and appointments  In addition, I have reviewed and discussed with patient certain preventive protocols, quality metrics, and best practice recommendations. A written personalized care plan for preventive services as well as general preventive health recommendations were provided to patient.     LRandel Pigg LPN   85/12/929  Nurse Notes: none

## 2020-11-15 DIAGNOSIS — E11319 Type 2 diabetes mellitus with unspecified diabetic retinopathy without macular edema: Secondary | ICD-10-CM | POA: Diagnosis not present

## 2020-11-17 DIAGNOSIS — E119 Type 2 diabetes mellitus without complications: Secondary | ICD-10-CM | POA: Diagnosis not present

## 2020-11-17 DIAGNOSIS — R319 Hematuria, unspecified: Secondary | ICD-10-CM | POA: Diagnosis not present

## 2020-11-17 DIAGNOSIS — N390 Urinary tract infection, site not specified: Secondary | ICD-10-CM | POA: Diagnosis not present

## 2020-11-27 ENCOUNTER — Ambulatory Visit: Payer: Medicare Other | Admitting: Family Medicine

## 2020-12-06 ENCOUNTER — Encounter: Payer: Medicare Other | Attending: Physical Medicine & Rehabilitation | Admitting: Physical Medicine & Rehabilitation

## 2020-12-06 ENCOUNTER — Other Ambulatory Visit: Payer: Self-pay

## 2020-12-06 ENCOUNTER — Encounter: Payer: Self-pay | Admitting: Physical Medicine & Rehabilitation

## 2020-12-06 VITALS — BP 126/72 | HR 71 | Temp 98.4°F | Ht 68.0 in | Wt 182.8 lb

## 2020-12-06 DIAGNOSIS — M5412 Radiculopathy, cervical region: Secondary | ICD-10-CM | POA: Insufficient documentation

## 2020-12-06 DIAGNOSIS — M542 Cervicalgia: Secondary | ICD-10-CM | POA: Diagnosis not present

## 2020-12-06 DIAGNOSIS — G894 Chronic pain syndrome: Secondary | ICD-10-CM | POA: Diagnosis not present

## 2020-12-06 DIAGNOSIS — M79605 Pain in left leg: Secondary | ICD-10-CM | POA: Insufficient documentation

## 2020-12-06 DIAGNOSIS — M5416 Radiculopathy, lumbar region: Secondary | ICD-10-CM | POA: Diagnosis not present

## 2020-12-06 DIAGNOSIS — M47812 Spondylosis without myelopathy or radiculopathy, cervical region: Secondary | ICD-10-CM | POA: Diagnosis not present

## 2020-12-06 DIAGNOSIS — Z79891 Long term (current) use of opiate analgesic: Secondary | ICD-10-CM | POA: Insufficient documentation

## 2020-12-06 DIAGNOSIS — E1142 Type 2 diabetes mellitus with diabetic polyneuropathy: Secondary | ICD-10-CM | POA: Diagnosis not present

## 2020-12-06 DIAGNOSIS — M79604 Pain in right leg: Secondary | ICD-10-CM | POA: Insufficient documentation

## 2020-12-06 DIAGNOSIS — Z5181 Encounter for therapeutic drug level monitoring: Secondary | ICD-10-CM | POA: Insufficient documentation

## 2020-12-06 NOTE — Progress Notes (Signed)
Please see media tab for full results.

## 2020-12-07 ENCOUNTER — Other Ambulatory Visit: Payer: Self-pay | Admitting: Registered Nurse

## 2020-12-11 ENCOUNTER — Ambulatory Visit: Payer: Medicare Other | Admitting: Family Medicine

## 2020-12-11 ENCOUNTER — Other Ambulatory Visit: Payer: Self-pay

## 2020-12-11 ENCOUNTER — Encounter: Payer: Medicare Other | Admitting: Registered Nurse

## 2020-12-11 VITALS — BP 149/68 | HR 81 | Temp 97.5°F | Ht 68.0 in | Wt 180.8 lb

## 2020-12-11 DIAGNOSIS — M47812 Spondylosis without myelopathy or radiculopathy, cervical region: Secondary | ICD-10-CM | POA: Diagnosis not present

## 2020-12-11 DIAGNOSIS — E1142 Type 2 diabetes mellitus with diabetic polyneuropathy: Secondary | ICD-10-CM | POA: Diagnosis not present

## 2020-12-11 DIAGNOSIS — Z5181 Encounter for therapeutic drug level monitoring: Secondary | ICD-10-CM

## 2020-12-11 DIAGNOSIS — M5416 Radiculopathy, lumbar region: Secondary | ICD-10-CM

## 2020-12-11 DIAGNOSIS — G894 Chronic pain syndrome: Secondary | ICD-10-CM | POA: Diagnosis not present

## 2020-12-11 DIAGNOSIS — M542 Cervicalgia: Secondary | ICD-10-CM

## 2020-12-11 DIAGNOSIS — Z79891 Long term (current) use of opiate analgesic: Secondary | ICD-10-CM | POA: Diagnosis not present

## 2020-12-11 DIAGNOSIS — M5412 Radiculopathy, cervical region: Secondary | ICD-10-CM

## 2020-12-11 DIAGNOSIS — M79604 Pain in right leg: Secondary | ICD-10-CM | POA: Diagnosis not present

## 2020-12-11 DIAGNOSIS — M79605 Pain in left leg: Secondary | ICD-10-CM | POA: Diagnosis not present

## 2020-12-11 MED ORDER — HYDROCODONE-ACETAMINOPHEN 7.5-325 MG PO TABS: 1.0000 | ORAL_TABLET | Freq: Four times a day (QID) | ORAL | 0 refills | Status: DC | PRN

## 2020-12-11 NOTE — Progress Notes (Addendum)
Subjective:    Patient ID: [name], female    DOB: November 30, 1950, 70 y.o.   MRN: [id number]  HPI: [name] is a 70 y.o. female who returns for follow up appointment for chronic pain and medication refill. She states her pain is located in her neck radiating into her left shoulder, lower back pain radiating into her left hip and left lower extremities. Also reports increase intensity and frequency of neuropathic pain in her bilateral lower extremities and bilateral feet pain. She  rates her pain 9. Her current exercise regime is walking.  [name] had a EMG on 09/01.2022, see note. [name] currently on Gabapentin 400 mg 4 times a day. We will increase her gabapentin  to 5  times a day. We will continue to monitor. She has a scheduled appointment with Dr Naaman Plummer, we discussed [place]. [name] will discusee the Lyrica with Dr Naaman Plummer. We will continue to monitor.   [name] Morphine equivalent is 30.00 MME.  Oral Swab was Performed today.     Pain Inventory Average Pain 9 Pain Right Now 9 My pain is sharp, burning, tingling, and aching  In the last 24 hours, has pain interfered with the following? General activity 8 Relation with others 8 Enjoyment of life 8 What TIME of day is your pain at its worst? morning , daytime, evening, and night Sleep (in general) Poor  Pain is worse with: bending, sitting, inactivity, standing, and some activites Pain improves with: rest, heat/ice, pacing activities, medication, TENS, and injections Relief from Meds: 10  Family History  Problem Relation Age of Onset   Diabetes Mother    Alzheimer's disease Mother    Lung cancer Father    Esophageal cancer Brother    Colon cancer Neg Hx    Social History   Socioeconomic History   Marital status: Divorced    Spouse name: Not on file   Number of children: Not on file   Years of education: Not on file   Highest education level: Not on file  Occupational History   Not on  file  Tobacco Use   Smoking status: Never   Smokeless tobacco: Never  Vaping Use   Vaping Use: Never used  Substance and Sexual Activity   Alcohol use: No   Drug use: No   Sexual activity: Not on file  Other Topics Concern   Not on file  Social History Narrative   Not on file   Social Determinants of Health   Financial Resource Strain: Low Risk    Difficulty of Paying Living Expenses: Not hard at all  Food Insecurity: No Food Insecurity   Worried About Charity fundraiser in the Last Year: Never true   Ran Out of Food in the Last Year: Never true  Transportation Needs: No Transportation Needs   Lack of Transportation (Medical): No   Lack of Transportation (Non-Medical): No  Physical Activity: Insufficiently Active   Days of Exercise per Week: 2 days   Minutes of Exercise per Session: 40 min  Stress: No Stress Concern Present   Feeling of Stress : Not at all  Social Connections: Moderately Integrated   Frequency of Communication with Friends and Family: Twice a week   Frequency of Social Gatherings with Friends and Family: Twice a week   Attends Religious Services: More than 4 times per year   Active Member of [employer] or Organizations: Yes   Attends Archivist Meetings: 1 to 4 times  per year   Marital Status: Widowed   Past Surgical History:  Procedure Laterality Date   ABDOMINAL HYSTERECTOMY     partial   ACHILLES TENDON SURGERY Left    APPENDECTOMY     BACK SURGERY     BLADDER SUSPENSION     BREAST SURGERY Right 2003   milk duct removed   CHOLECYSTECTOMY     FRACTURE SURGERY     collar bone   NOSE SURGERY     x 2    OTHER SURGICAL HISTORY     Breast Duct removed   PARTIAL HYSTERECTOMY     RECTOCELE REPAIR     TONSILLECTOMY     VASCULAR SURGERY Right    right leg   VEIN SURGERY Right    leg   Past Surgical History:  Procedure Laterality Date   ABDOMINAL HYSTERECTOMY     partial   ACHILLES TENDON SURGERY Left    APPENDECTOMY     BACK SURGERY      BLADDER SUSPENSION     BREAST SURGERY Right 2003   milk duct removed   CHOLECYSTECTOMY     FRACTURE SURGERY     collar bone   NOSE SURGERY     x 2    OTHER SURGICAL HISTORY     Breast Duct removed   PARTIAL HYSTERECTOMY     RECTOCELE REPAIR     TONSILLECTOMY     VASCULAR SURGERY Right    right leg   VEIN SURGERY Right    leg   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HCC)    Anxiety    Arthritis    Diabetes mellitus without complication (HCC)    GERD (gastroesophageal reflux disease)    Heart murmur    Hyperlipidemia    Hypertension    Neuromuscular disorder (HCC)    Palpitations    Pneumonia    PONV (postoperative nausea and vomiting)    PTSD (post-traumatic stress disorder)    Ringing of ears, left    BP (!) 149/68   Pulse 81   Temp (!) 97.5 F (36.4 C) (Oral)   Ht '5\' 8"'$  (1.727 m)   Wt 180 lb 12.8 oz (82 kg)   SpO2 97%   BMI 27.49 kg/m   Opioid Risk Score:   Fall Risk Score:  `1  Depression screen PHQ 2/9  Depression screen [facility] 2/9 12/06/2020 11/06/2020 11/06/2020 10/11/2020 08/24/2020 04/10/2020 12/30/2019  Decreased Interest 1 0 0 '1 2 1 1  '$ Down, Depressed, Hopeless 1 0 0 '1 2 1 1  '$ PHQ - 2 Score 2 0 0 '2 4 2 2  '$ Altered sleeping - - - - 3 - -  Tired, decreased energy - - - - 2 - -  Change in appetite - - - - 1 - -  Feeling bad or failure about yourself  - - - - 0 - -  Trouble concentrating - - - - 0 - -  Moving slowly or fidgety/restless - - - - 0 - -  Suicidal thoughts - - - - 0 - -  PHQ-9 Score - - - - 10 - -  Difficult doing work/chores - - - - Very difficult - -  Some recent data might be hidden     Review of Systems  Musculoskeletal:  Positive for back pain and neck pain.       Calf pain Left hamstring pain Feet pain  All other systems reviewed and are negative.  Objective:   Physical Exam Vitals and nursing note reviewed.  Constitutional:      Appearance: Normal appearance.  Cardiovascular:     Rate and Rhythm:  Normal rate and regular rhythm.  Pulmonary:     Effort: Pulmonary effort is normal.     Breath sounds: Normal breath sounds.  Musculoskeletal:     Cervical back: Normal range of motion and neck supple.     Comments: Normal Muscle Bulk and Muscle Testing Reveals:  Upper Extremities: Full ROM and Muscle Strength 5/5 Left AC Joint Tenderness  Lumbar Lower Extremities Gait     Skin:    General: Skin is warm and dry.  Neurological:     Mental Status: She is alert and oriented to person, place, and time.  Psychiatric:        Mood and Affect: Mood normal.        Behavior: Behavior normal.         Assessment & Plan:  1.Chronic cervicalgia with documented spondylosis on MRI, per Dr. Naaman Plummer Note and Cervical Radiculitis: Continue Gabapentin,  Continue HEP as Tolerated. 12/11/2020 2. Left shoulder pain most consistent with left rotator cuff syndrome. Mild DJD.  Continue HEP as tolerated.Continue to monitor. 12/11/2020. Refilled: Hydrocodone 7.5/325 mg one tablet every 6 hours as needed for pain. #120. Second script sent for the following month. We will continue the opioid monitoring program, this consists of regular clinic visits, examinations, urine drug screen, pill counts as well as use of [place] Controlled Substance Reporting system. A 12 month History has been reviewed on the [place] on 12/11/2020. 3. Left wrist/finger pain most consistent with OA, ?post traumatic. No complaints today. 12/11/2020 4. Anxiety: Continue Klonopin, PCP prescribing. Continue to monitor. 12/11/2020 5. Restless Leg syndrome: Continue Gabapentin. 12/11/2020 7. Sacroiliac inflammation: S/P Right  SI injection on 09/20/2019, with No relief noted. [name] seen Dr Marlou Sa in regards to her pain. Dr Saintclair Halsted. Following.  Continue to Monitor. 12/11/2020. 8. Myofascial Pain: Continue Robaxin as needed. 12/11/2020.  9. Bilateral feet with neuropathic pain: Increase  Gabapentin 600 mg 4 times a day. Continue to monitor. 12/11/2020. 10.  Herniated Nucleus Pulposus L2-3 with recurrent disc rupture: S/P  Posterior Lumbar Interbody Fusion  - Lumbar two-Lumbar three on 01/13/2020 by Dr Saintclair Halsted.  Dr Saintclair Halsted Following. 12/11/2020   F/U in 2 months

## 2020-12-14 ENCOUNTER — Encounter: Payer: Self-pay | Admitting: Registered Nurse

## 2020-12-14 ENCOUNTER — Telehealth: Payer: Self-pay | Admitting: Registered Nurse

## 2020-12-14 MED ORDER — GABAPENTIN 600 MG PO TABS: 600.0000 mg | ORAL_TABLET | Freq: Four times a day (QID) | ORAL | 3 refills | Status: DC

## 2020-12-14 NOTE — Telephone Encounter (Signed)
Return Ms. Swint call, Gabapentin changed to 600 mg 4 times a day, she verbalizes understanding.

## 2020-12-17 ENCOUNTER — Other Ambulatory Visit: Payer: Self-pay

## 2020-12-17 ENCOUNTER — Ambulatory Visit: Payer: Medicare Other | Admitting: Family Medicine

## 2020-12-17 NOTE — Progress Notes (Signed)
Chief Complaint  Patient presents with   possible kidney infection    HPI: [name] is a 70 y.o. female, who is here today for 5 months follow up.   She was last seen on 08/24/20 for acute visit. Since her last visit she has seen cardiology. She had echo done and has not have recommended stress test. Echo 09/25/20: LVEF 54-27%, grade I diastolic dysfunction.  Today she is concerned about possible UTI. She was treated for UTI 3 weeks ago,took abx x 14 days, she was having gross hematuria and urinary frequency. Symptoms resolved. She is having worsening left lower back pain, she is afraid that UTI may be coming back, so would like UA done.  Negative for dysuria,increased urinary frequency, recurrent gross hematuria,or decreased urine output. She has not had fever or chills. She has hx of chronic back pain/lumbar radiculitis, s/p lumbar spine surgery. She follows with ortho and chronic pain management. She had an epidural injection that helped with back pain.  Diabetes Mellitus II: Dx'ed in 2014. - Checking BG at home: 130's-150's. She has lost some wt, decreased appetite. - Medications: Trulicity 1.5 mg weekly. - Compliance: Good - eye exam: 11/2019. - foot exam: 02/2020.  - Negative for symptoms of hypoglycemia, polyuria, polydipsia, foot ulcers/trauma + Peripheral neuropathy. Gabapentin was increased by pain management, now she is taking 600 mg qid.  Lab Results  Component Value Date   HGBA1C 6.8 05/18/2020   Lab Results  Component Value Date   MICROALBUR 0.8 12/27/2019   Hypertension:  Medications:HCTZ 25 mg daily.She has tried other antihypertensive medications and did not tolerate well. BP readings at home:Not checking. Side effects:Not significant, except for hypoK+  Negative for unusual or severe headache, visual changes, exertional chest pain, dyspnea,  focal weakness, or edema.  HypoK+: She is on KLOR 10 meq daily.  Lab Results  Component Value  Date   CREATININE 0.89 07/02/2020   BUN 18 07/02/2020   NA 140 07/02/2020   K 3.4 (L) 07/02/2020   CL 102 07/02/2020   CO2 26 07/02/2020   Anxiety,depression, and insomnia: She is on Clonazepam 1 mg bid prn and Seroquel 100 mg 1/2 tab at bedtime. Problem has been exacerbated by recent personal events and health problems. She is now working part time, which has helped with anxiety. She is performing well.  Found out his ex fiance,with whom she still has a friendly relation and going out sometimes, is married. She still shares bills with him and have common accounts, he is refusing to pay her money he borrowed from her and does not want to close common accounts. This has caused a great deal of stress. Clonazepam still helps. She is also on chronic opioid treatment. Denies side effects.  Sleeping well but LE cramps are interfering with sleep. It is so "bad" that causes nausea. Alleviated by walking for a few minutes. Exacerbated by certain movements. Concerned about Mg++ levels. Last Mg checked in 06/2020 was 1.9.  HLD: She has not tolerated statin medication, caused muscle aches. She has tried Crestor. Lab Results  Component Value Date   CHOL 233 (H) 04/04/2019   HDL 37.20 (L) 04/04/2019   LDLCALC 84 12/16/2016   LDLDIRECT 94.0 04/04/2019   TRIG (H) 04/04/2019    464.0 Triglyceride is over 400; calculations on Lipids are invalid.   CHOLHDL 6 04/04/2019   Review of Systems  Constitutional:  Positive for fatigue. Negative for activity change, appetite change and fever.  HENT:  Negative  for mouth sores, nosebleeds and sore throat.   Respiratory:  Negative for cough and wheezing.   Gastrointestinal:  Negative for abdominal pain and vomiting.       Negative for changes in bowel habits.  Musculoskeletal:  Positive for arthralgias, back pain and myalgias.  Neurological:  Negative for syncope, facial asymmetry and weakness.  Psychiatric/Behavioral:  Positive for sleep disturbance.  Negative for confusion. The patient is nervous/anxious.   Rest of ROS, see pertinent positives sand negatives in HPI  Current Outpatient Medications on File Prior to Visit  Medication Sig Dispense Refill   ACCU-CHEK SOFTCLIX LANCETS lancets Use to test blood sugar 3 times daily. Dx: E11.8 100 each 12   albuterol (VENTOLIN HFA) 108 (90 Base) MCG/ACT inhaler INHALE 2 PUFFS INTO THE LUNGS EVERY 6 HOURS AS NEEDED FOR WHEEZING OR SHORTNESS OF BREATH 54 g 0   Alcohol Swabs (B-D SINGLE USE SWABS REGULAR) PADS Use to test blood sugar 3 times daily 100 each 3   Blood Glucose Calibration (ACCU-CHEK AVIVA) SOLN Use as directed 1 each 2   Blood Glucose Monitoring Suppl (ACCU-CHEK AVIVA PLUS) w/Device KIT Use to check blood sugar 3 times daily 1 kit 1   clonazePAM (KLONOPIN) 1 MG tablet Take 1 tablet (1 mg total) by mouth 2 (two) times daily as needed for anxiety. 60 tablet 3   Dulaglutide (TRULICITY) 1.5 RA/0.7MA SOPN Inject 1.5 mg into the skin once a week. 13 mL 3   gabapentin (NEURONTIN) 600 MG tablet Take 1 tablet (600 mg total) by mouth in the morning, at noon, in the evening, and at bedtime. Discontinued 400 mg prescription from her profile. 120 tablet 3   glucose blood (ACCU-CHEK AVIVA) test strip Use to test blood sugar 3 times daily. Dx: 11.8 100 each 12   hydrochlorothiazide (HYDRODIURIL) 25 MG tablet TAKE 1 TABLET BY MOUTH EVERY DAY 90 tablet 3   HYDROcodone-acetaminophen (NORCO) 7.5-325 MG tablet Take 1 tablet by mouth every 6 (six) hours as needed for moderate pain. 120 tablet 0   ipratropium (ATROVENT) 0.06 % nasal spray Place 2 sprays into both nostrils 4 (four) times daily. 15 mL 3   omeprazole (PRILOSEC) 40 MG capsule Take 1 capsule (40 mg total) by mouth daily. 90 capsule 2   QUEtiapine (SEROQUEL) 100 MG tablet Take 0.5 tablets (50 mg total) by mouth at bedtime. 45 tablet 3   triamcinolone (KENALOG) 0.1 % paste Use as directed 1 application in the mouth or throat 2 (two) times daily. 5 g 1    No current facility-administered medications on file prior to visit.     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[place])    Anxiety    Arthritis    Diabetes mellitus without complication (HCC)    GERD (gastroesophageal reflux disease)    Heart murmur    Hyperlipidemia    Hypertension    Neuromuscular disorder (HCC)    Palpitations    Pneumonia    PONV (postoperative nausea and vomiting)    PTSD (post-traumatic stress disorder)    Ringing of ears, left    Allergies  Allergen Reactions   Hydromorphone Nausea And Vomiting and Other (See Comments)    Made the patient feel "spaced out," also Made the patient feel "spaced out," also Other reaction(s): Other (See Comments) Made the patient feel "spaced out," also Made the patient feel "spaced out," also Made the patient feel "spaced out," also   Morphine And Related Itching  With high doses   Other Itching   Penicillins Rash    Has patient had a PCN reaction causing immediate rash, facial/tongue/throat swelling, SOB or lightheadedness with hypotension: Yes Has patient had a PCN reaction causing severe rash involving mucus membranes or skin necrosis: No Has patient had a PCN reaction that required hospitalization: No Has patient had a PCN reaction occurring within the last 10 years: No If all of the above answers are "NO", then may proceed with Cephalosporin use.     Social History   Socioeconomic History   Marital status: Divorced    Spouse name: Not on file   Number of children: Not on file   Years of education: Not on file   Highest education level: Not on file  Occupational History   Not on file  Tobacco Use   Smoking status: Never   Smokeless tobacco: Never  Vaping Use   Vaping Use: Never used  Substance and Sexual Activity   Alcohol use: No   Drug use: No   Sexual activity: Not on file  Other Topics Concern   Not on file  Social History Narrative   Not on file   Social  Determinants of Health   Financial Resource Strain: Low Risk    Difficulty of Paying Living Expenses: Not hard at all  Food Insecurity: No Food Insecurity   Worried About Running Out of Food in the Last Year: Never true   [place] in the Last Year: Never true  Transportation Needs: No Transportation Needs   Lack of Transportation (Medical): No   Lack of Transportation (Non-Medical): No  Physical Activity: Insufficiently Active   Days of Exercise per Week: 2 days   Minutes of Exercise per Session: 40 min  Stress: No Stress Concern Present   Feeling of Stress : Not at all  Social Connections: Moderately Integrated   Frequency of Communication with Friends and Family: Twice a week   Frequency of Social Gatherings with Friends and Family: Twice a week   Attends Religious Services: More than 4 times per year   Active Member of [employer] or Organizations: Yes   Attends Archivist Meetings: 1 to 4 times per year   Marital Status: Widowed    Vitals:   12/18/20 1554  BP: 136/80  Pulse: 85  Resp: 16  SpO2: 97%   Body mass index is 27.52 kg/m.  Physical Exam Vitals and nursing note reviewed.  Constitutional:      General: She is not in acute distress.    Appearance: She is well-developed.  HENT:     Head: Normocephalic and atraumatic.     Mouth/Throat:     Mouth: Mucous membranes are moist.     Pharynx: Oropharynx is clear.  Eyes:     Conjunctiva/sclera: Conjunctivae normal.  Cardiovascular:     Rate and Rhythm: Normal rate. Rhythm irregular.     Pulses:          Dorsalis pedis pulses are 2+ on the right side and 2+ on the left side.     Heart sounds: No murmur heard. Pulmonary:     Effort: Pulmonary effort is normal. No respiratory distress.     Breath sounds: Normal breath sounds.  Abdominal:     Palpations: Abdomen is soft. There is no hepatomegaly or mass.     Tenderness: There is no abdominal tenderness.  Lymphadenopathy:     Cervical: No cervical  adenopathy.  Skin:    General: Skin  is warm.     Findings: No erythema or rash.  Neurological:     General: No focal deficit present.     Mental Status: She is alert and oriented to person, place, and time.     Cranial Nerves: No cranial nerve deficit.     Gait: Gait normal.  Psychiatric:        Mood and Affect: Mood is anxious. Affect is labile.     Comments: Well groomed, good eye contact.   Diabetic Foot Exam - Simple   Simple Foot Form  12/18/2020  4:31 PM  Visual Inspection No deformities, no ulcerations, no other skin breakdown bilaterally: Yes Sensation Testing See comments: Yes Pulse Check Posterior Tibialis and Dorsalis pulse intact bilaterally: Yes Comments Decreased monofilament, bilateral.    ASSESSMENT AND PLAN:  [name] was seen today for 5 months follow-up.  Orders Placed This Encounter  Procedures   Basic metabolic panel   Lipid panel   Microalbumin / creatinine urine ratio   Magnesium   TSH   POCT urinalysis dipstick   POC HgB A1c   Lab Results  Component Value Date   HGBA1C 6.2 12/18/2020   Chronic left-sided low back pain, unspecified whether sciatica present Completed treatment for UTI and hx of chronic back pain. Urine dipstick and current hx do not suggest an acute infectious process. Urine dipstick negative for blood,leuk,and nitrite.  Leg cramping We discussed possible etiologies. Some of her meds and chronic medical conditions could be contributing factors. Adequate hydration. Further recommendations according to lab results.  Hyperlipidemia associated with type 2 diabetes mellitus (HCC) Statin medication has aggravated myalgias, she is not interested in trying another medication, afraid it would exacerbate cramps. Continue low fat diet. Fasting lab orders placed.  Type 2 diabetes mellitus with neurological complications (HCC) UUV2Z at goal. No changes in current management. Regular exercise and healthy diet with  avoidance of added sugar food intake is an important part of treatment and recommended. Annual eye exam, periodic dental and foot care recommended. F/U in 5 months   Essential hypertension BP adequately controlled. Continue current management: HCTZ 25 mg daily, some side effects discussed. DASH/low salt diet to continue. Continue monitoring BP at home.  HYPOKALEMIA KLOR dose increased from 10 meq to 20 meq daily. Will arrange lab appt. Side effects of HCTZ discussed , she would like to continue it.  Anxiety disorder, unspecified In general problem is stable. Continue Clonazepam 1 mg bid prn.  I spent a total of 49 minutes in both face to face and non face to face activities for this visit on the date of this encounter. During this time history was obtained and documented, examination was performed, prior labs/imaging reviewed, and assessment/plan discussed.  Return in about 5 months (around 05/20/2021) for fasting lab appt..   Dino Borntreger G. [place], MD  [facility]. [place] office.

## 2020-12-18 ENCOUNTER — Ambulatory Visit (INDEPENDENT_AMBULATORY_CARE_PROVIDER_SITE_OTHER): Payer: Medicare Other | Admitting: Family Medicine

## 2020-12-18 ENCOUNTER — Encounter: Payer: Self-pay | Admitting: Family Medicine

## 2020-12-18 VITALS — BP 136/80 | HR 85 | Resp 16 | Ht 68.0 in | Wt 181.0 lb

## 2020-12-18 DIAGNOSIS — E1149 Type 2 diabetes mellitus with other diabetic neurological complication: Secondary | ICD-10-CM | POA: Diagnosis not present

## 2020-12-18 DIAGNOSIS — R109 Unspecified abdominal pain: Secondary | ICD-10-CM

## 2020-12-18 DIAGNOSIS — R252 Cramp and spasm: Secondary | ICD-10-CM

## 2020-12-18 DIAGNOSIS — I10 Essential (primary) hypertension: Secondary | ICD-10-CM | POA: Diagnosis not present

## 2020-12-18 DIAGNOSIS — G8929 Other chronic pain: Secondary | ICD-10-CM

## 2020-12-18 DIAGNOSIS — E785 Hyperlipidemia, unspecified: Secondary | ICD-10-CM

## 2020-12-18 DIAGNOSIS — M545 Low back pain, unspecified: Secondary | ICD-10-CM

## 2020-12-18 DIAGNOSIS — E876 Hypokalemia: Secondary | ICD-10-CM

## 2020-12-18 DIAGNOSIS — E1169 Type 2 diabetes mellitus with other specified complication: Secondary | ICD-10-CM

## 2020-12-18 DIAGNOSIS — F419 Anxiety disorder, unspecified: Secondary | ICD-10-CM

## 2020-12-18 LAB — POCT URINALYSIS DIPSTICK
Bilirubin, UA: POSITIVE
Blood, UA: NEGATIVE
Glucose, UA: NEGATIVE
Ketones, UA: POSITIVE
Leukocytes, UA: NEGATIVE
Nitrite, UA: NEGATIVE
Protein, UA: POSITIVE — AB
Spec Grav, UA: 1.025 (ref 1.010–1.025)
Urobilinogen, UA: 0.2 E.U./dL
pH, UA: 5.5 (ref 5.0–8.0)

## 2020-12-18 LAB — DRUG TOX MONITOR 1 W/CONF, ORAL FLD
Alprazolam: NEGATIVE ng/mL (ref <0.50)
Amphetamines: NEGATIVE ng/mL (ref <10)
Barbiturates: NEGATIVE ng/mL (ref <10)
Benzodiazepines: POSITIVE ng/mL — AB (ref <0.50)
Buprenorphine: NEGATIVE ng/mL (ref <0.10)
Chlordiazepoxide: NEGATIVE ng/mL (ref <0.50)
Clonazepam: 0.75 ng/mL — ABNORMAL HIGH (ref <0.50)
Cocaine: NEGATIVE ng/mL (ref <5.0)
Codeine: NEGATIVE ng/mL (ref <2.5)
Diazepam: NEGATIVE ng/mL (ref <0.50)
Dihydrocodeine: 2.8 ng/mL — ABNORMAL HIGH (ref <2.5)
Fentanyl: NEGATIVE ng/mL (ref <0.10)
Flunitrazepam: NEGATIVE ng/mL (ref <0.50)
Flurazepam: NEGATIVE ng/mL (ref <0.50)
Heroin Metabolite: NEGATIVE ng/mL (ref <1.0)
Hydrocodone: 14.8 ng/mL — ABNORMAL HIGH (ref <2.5)
Hydromorphone: NEGATIVE ng/mL (ref <2.5)
Lorazepam: NEGATIVE ng/mL (ref <0.50)
MARIJUANA: NEGATIVE ng/mL (ref <2.5)
MDMA: NEGATIVE ng/mL (ref <10)
Meprobamate: NEGATIVE ng/mL (ref <2.5)
Methadone: NEGATIVE ng/mL (ref <5.0)
Midazolam: NEGATIVE ng/mL (ref <0.50)
Morphine: NEGATIVE ng/mL (ref <2.5)
Nicotine Metabolite: NEGATIVE ng/mL (ref <5.0)
Nordiazepam: NEGATIVE ng/mL (ref <0.50)
Norhydrocodone: NEGATIVE ng/mL (ref <2.5)
Noroxycodone: NEGATIVE ng/mL (ref <2.5)
Opiates: POSITIVE ng/mL — AB (ref <2.5)
Oxazepam: NEGATIVE ng/mL (ref <0.50)
Oxycodone: NEGATIVE ng/mL (ref <2.5)
Oxymorphone: NEGATIVE ng/mL (ref <2.5)
Phencyclidine: NEGATIVE ng/mL (ref <10)
Tapentadol: NEGATIVE ng/mL (ref <5.0)
Temazepam: NEGATIVE ng/mL (ref <0.50)
Tramadol: NEGATIVE ng/mL (ref <5.0)
Triazolam: NEGATIVE ng/mL (ref <0.50)
Zolpidem: NEGATIVE ng/mL (ref <5.0)

## 2020-12-18 LAB — DRUG TOX ALC METAB W/CON, ORAL FLD: Alcohol Metabolite: NEGATIVE ng/mL (ref <25)

## 2020-12-18 MED ORDER — POTASSIUM CHLORIDE CRYS ER 20 MEQ PO TBCR: 20.0000 meq | EXTENDED_RELEASE_TABLET | Freq: Every day | ORAL | 3 refills | Status: DC

## 2020-12-18 NOTE — Assessment & Plan Note (Signed)
HgA1C at goal. No changes in current management. Regular exercise and healthy diet with avoidance of added sugar food intake is an important part of treatment and recommended. Annual eye exam, periodic dental and foot care recommended. F/U in 5 months

## 2020-12-18 NOTE — Assessment & Plan Note (Signed)
In general problem is stable. Continue Clonazepam 1 mg bid prn.

## 2020-12-18 NOTE — Patient Instructions (Signed)
A few things to remember from today's visit:   Flank pain - Plan: POCT urinalysis dipstick  Essential hypertension  Leg cramping  Hypokalemia - Plan: potassium chloride SA (KLOR-CON) 20 MEQ tablet  If you need refills please call your pharmacy. Do not use My Chart to request refills or for acute issues that need immediate attention.   Potassium dose increased. Fasting lab appointment. No changes in rest of meds.  Please be sure medication list is accurate. If a new problem present, please set up appointment sooner than planned today.

## 2020-12-18 NOTE — Assessment & Plan Note (Addendum)
BP adequately controlled. Continue current management: HCTZ 25 mg daily, some side effects discussed. DASH/low salt diet to continue. Continue monitoring BP at home.

## 2020-12-18 NOTE — Assessment & Plan Note (Signed)
KLOR dose increased from 10 meq to 20 meq daily. Will arrange lab appt. Side effects of HCTZ discussed , she would like to continue it.

## 2020-12-19 LAB — POCT GLYCOSYLATED HEMOGLOBIN (HGB A1C): HbA1c, POC (prediabetic range): 6.2 % (ref 5.7–6.4)

## 2020-12-20 ENCOUNTER — Ambulatory Visit: Payer: Medicare Other | Admitting: Registered Nurse

## 2020-12-22 ENCOUNTER — Encounter: Payer: Self-pay | Admitting: Family Medicine

## 2020-12-26 ENCOUNTER — Telehealth: Payer: Self-pay | Admitting: *Deleted

## 2020-12-26 ENCOUNTER — Ambulatory Visit: Payer: Medicare Other | Admitting: Physical Medicine & Rehabilitation

## 2020-12-26 NOTE — Telephone Encounter (Signed)
Oral swab drug screen was consistent for prescribed medications.  ?

## 2020-12-28 ENCOUNTER — Emergency Department (HOSPITAL_BASED_OUTPATIENT_CLINIC_OR_DEPARTMENT_OTHER): Payer: Medicare Other

## 2020-12-28 ENCOUNTER — Other Ambulatory Visit: Payer: Self-pay

## 2020-12-28 ENCOUNTER — Emergency Department (HOSPITAL_BASED_OUTPATIENT_CLINIC_OR_DEPARTMENT_OTHER)
Admission: EM | Admit: 2020-12-28 | Discharge: 2020-12-28 | Disposition: A | Discharge status: Home / Self Care | Payer: Medicare Other | Attending: Emergency Medicine | Admitting: Emergency Medicine

## 2020-12-28 ENCOUNTER — Ambulatory Visit
Admission: EM | Admit: 2020-12-28 | Discharge: 2020-12-28 | Disposition: A | Discharge status: Home / Self Care | Payer: Medicare Other | Attending: Emergency Medicine | Admitting: Emergency Medicine

## 2020-12-28 DIAGNOSIS — S0990XA Unspecified injury of head, initial encounter: Secondary | ICD-10-CM | POA: Diagnosis not present

## 2020-12-28 DIAGNOSIS — Z043 Encounter for examination and observation following other accident: Secondary | ICD-10-CM | POA: Diagnosis not present

## 2020-12-28 DIAGNOSIS — M25552 Pain in left hip: Secondary | ICD-10-CM | POA: Insufficient documentation

## 2020-12-28 DIAGNOSIS — I10 Essential (primary) hypertension: Secondary | ICD-10-CM | POA: Insufficient documentation

## 2020-12-28 DIAGNOSIS — E1140 Type 2 diabetes mellitus with diabetic neuropathy, unspecified: Secondary | ICD-10-CM | POA: Insufficient documentation

## 2020-12-28 DIAGNOSIS — W01198A Fall on same level from slipping, tripping and stumbling with subsequent striking against other object, initial encounter: Secondary | ICD-10-CM | POA: Diagnosis not present

## 2020-12-28 DIAGNOSIS — S0083XA Contusion of other part of head, initial encounter: Secondary | ICD-10-CM | POA: Diagnosis not present

## 2020-12-28 DIAGNOSIS — Y990 Civilian activity done for income or pay: Secondary | ICD-10-CM | POA: Insufficient documentation

## 2020-12-28 DIAGNOSIS — W19XXXA Unspecified fall, initial encounter: Secondary | ICD-10-CM

## 2020-12-28 LAB — POCT FASTING CBG KUC MANUAL ENTRY: POCT Glucose (KUC): 146 mg/dL — AB (ref 70–99)

## 2020-12-28 NOTE — ED Triage Notes (Signed)
Pt arrives POV, states she fell backwards hitting left side of hip and head.  Denies LOC.  States she had some nausea after fall and took zofran which relieved nausea.

## 2020-12-28 NOTE — ED Provider Notes (Signed)
[facility]   [id number] 12/28/20 Arrival Time: 7322  CC: Head trauma  SUBJECTIVE:  [name] is a 70 y.o. female who complains of head trauma that occurred earlier today.  Reports fall backwards and hitting back of head.  Denis LOC or blacking out.  Patient localizes her pain to the back of head.  Describes the pain as constant and throbbing in character.  Denies alleviating factors.  Symptoms are made worse to the touch.  Denies similar symptoms in the past.  Had an episode of dizziness, nausea, now resolved.  Patient denies fever, chills, vomiting, chest pain, SOB, abdominal pain, weakness, numbness or tingling, slurred speech.     ROS: As per HPI.  All other pertinent ROS negative.     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[place])    Anxiety    Arthritis    Diabetes mellitus without complication (HCC)    GERD (gastroesophageal reflux disease)    Heart murmur    Hyperlipidemia    Hypertension    Neuromuscular disorder (HCC)    Palpitations    Pneumonia    PONV (postoperative nausea and vomiting)    PTSD (post-traumatic stress disorder)    Ringing of ears, left    Past Surgical History:  Procedure Laterality Date   ABDOMINAL HYSTERECTOMY     partial   ACHILLES TENDON SURGERY Left    APPENDECTOMY     BACK SURGERY     BLADDER SUSPENSION     BREAST SURGERY Right 2003   milk duct removed   CHOLECYSTECTOMY     FRACTURE SURGERY     collar bone   NOSE SURGERY     x 2    OTHER SURGICAL HISTORY     Breast Duct removed   PARTIAL HYSTERECTOMY     RECTOCELE REPAIR     TONSILLECTOMY     VASCULAR SURGERY Right    right leg   VEIN SURGERY Right    leg   Allergies  Allergen Reactions   Hydromorphone Nausea And Vomiting and Other (See Comments)    Made the patient feel "spaced out," also Made the patient feel "spaced out," also Other reaction(s): Other (See Comments) Made the patient feel "spaced out," also Made the patient feel  "spaced out," also Made the patient feel "spaced out," also   Morphine And Related Itching    With high doses   Other Itching   Penicillins Rash    Has patient had a PCN reaction causing immediate rash, facial/tongue/throat swelling, SOB or lightheadedness with hypotension: Yes Has patient had a PCN reaction causing severe rash involving mucus membranes or skin necrosis: No Has patient had a PCN reaction that required hospitalization: No Has patient had a PCN reaction occurring within the last 10 years: No If all of the above answers are "NO", then may proceed with Cephalosporin use.    No current facility-administered medications on file prior to encounter.   Current Outpatient Medications on File Prior to Encounter  Medication Sig Dispense Refill   ACCU-CHEK SOFTCLIX LANCETS lancets Use to test blood sugar 3 times daily. Dx: E11.8 100 each 12   albuterol (VENTOLIN HFA) 108 (90 Base) MCG/ACT inhaler INHALE 2 PUFFS INTO THE LUNGS EVERY 6 HOURS AS NEEDED FOR WHEEZING OR SHORTNESS OF BREATH 54 g 0   Alcohol Swabs (B-D SINGLE USE SWABS REGULAR) PADS Use to test blood sugar 3 times daily 100 each 3   Blood Glucose Calibration (ACCU-CHEK  AVIVA) SOLN Use as directed 1 each 2   Blood Glucose Monitoring Suppl (ACCU-CHEK AVIVA PLUS) w/Device KIT Use to check blood sugar 3 times daily 1 kit 1   clonazePAM (KLONOPIN) 1 MG tablet Take 1 tablet (1 mg total) by mouth 2 (two) times daily as needed for anxiety. 60 tablet 3   Dulaglutide (TRULICITY) 1.5 LE/7.5TZ SOPN Inject 1.5 mg into the skin once a week. 13 mL 3   gabapentin (NEURONTIN) 600 MG tablet Take 1 tablet (600 mg total) by mouth in the morning, at noon, in the evening, and at bedtime. Discontinued 400 mg prescription from her profile. 120 tablet 3   glucose blood (ACCU-CHEK AVIVA) test strip Use to test blood sugar 3 times daily. Dx: 11.8 100 each 12   hydrochlorothiazide (HYDRODIURIL) 25 MG tablet TAKE 1 TABLET BY MOUTH EVERY DAY 90 tablet 3    HYDROcodone-acetaminophen (NORCO) 7.5-325 MG tablet Take 1 tablet by mouth every 6 (six) hours as needed for moderate pain. 120 tablet 0   ipratropium (ATROVENT) 0.06 % nasal spray Place 2 sprays into both nostrils 4 (four) times daily. 15 mL 3   omeprazole (PRILOSEC) 40 MG capsule Take 1 capsule (40 mg total) by mouth daily. 90 capsule 2   potassium chloride SA (KLOR-CON) 20 MEQ tablet Take 1 tablet (20 mEq total) by mouth daily. 30 tablet 3   QUEtiapine (SEROQUEL) 100 MG tablet Take 0.5 tablets (50 mg total) by mouth at bedtime. 45 tablet 3   triamcinolone (KENALOG) 0.1 % paste Use as directed 1 application in the mouth or throat 2 (two) times daily. 5 g 1   Social History   Socioeconomic History   Marital status: Divorced    Spouse name: Not on file   Number of children: Not on file   Years of education: Not on file   Highest education level: Not on file  Occupational History   Not on file  Tobacco Use   Smoking status: Never   Smokeless tobacco: Never  Vaping Use   Vaping Use: Never used  Substance and Sexual Activity   Alcohol use: No   Drug use: No   Sexual activity: Not on file  Other Topics Concern   Not on file  Social History Narrative   Not on file   Social Determinants of Health   Financial Resource Strain: Low Risk    Difficulty of Paying Living Expenses: Not hard at all  Food Insecurity: No Food Insecurity   Worried About Running Out of Food in the Last Year: Never true   [place] in the Last Year: Never true  Transportation Needs: No Transportation Needs   Lack of Transportation (Medical): No   Lack of Transportation (Non-Medical): No  Physical Activity: Insufficiently Active   Days of Exercise per Week: 2 days   Minutes of Exercise per Session: 40 min  Stress: No Stress Concern Present   Feeling of Stress : Not at all  Social Connections: Moderately Integrated   Frequency of Communication with Friends and Family: Twice a week   Frequency of  Social Gatherings with Friends and Family: Twice a week   Attends Religious Services: More than 4 times per year   Active Member of [employer] or Organizations: Yes   Attends Archivist Meetings: 1 to 4 times per year   Marital Status: Widowed  Intimate Partner Violence: Not At Risk   Fear of Current or Ex-Partner: No   Emotionally Abused: No  Physically Abused: No   Sexually Abused: No   Family History  Problem Relation Age of Onset   Diabetes Mother    Alzheimer's disease Mother    Lung cancer Father    Esophageal cancer Brother    Colon cancer Neg Hx     OBJECTIVE:  Vitals:   12/28/20 1525  BP: 123/61  Pulse: (!) 43  Resp: 18  SpO2: 95%    General appearance: alert; no distress Eyes: PERRLA; EOMI HENT: normocephalic; atraumatic, indentation to back of LT head, TTP, no obvious lacerations or wounds Neck: supple with FROM Lungs: clear to auscultation bilaterally Heart: regular rate and rhythm.  R Extremities: no edema; symmetrical with no gross deformities Skin: warm and dry Neurologic: CN 2-12 grossly intact; finger to nose without difficulty; normal gait; strength and sensation intact bilaterally about the upper and lower extremities; negative pronator drift Psychological: alert and cooperative; normal mood and affect  Results for orders placed or performed during the hospital encounter of 12/28/20 (from the past 24 hour(s))  POCT CBG (manual entry)     Status: Abnormal   Collection Time: 12/28/20  3:48 PM  Result Value Ref Range   POCT Glucose (K[facility]) 146 (A) 70 - 99 mg/dL    EKG: normal EKG, normal sinus rhythm.   ASSESSMENT & PLAN:  1. Head trauma, initial encounter     Cannot rule out brain bleed in urgent care.  Patient aware and in agreement with plan.  Will travel by private vehicle to ED.  Patient declines EMS transports.  States she feels comfortable transferring herself.      Lestine Box, PA-C 12/28/20 1611

## 2020-12-28 NOTE — Discharge Instructions (Addendum)
Cannot rule out brain bleed in urgent care.  Patient aware and in agreement with plan.  Will travel by private vehicle to ED.  Patient declines EMS transports.  States she feels comfortable transferring herself.

## 2020-12-28 NOTE — ED Notes (Signed)
This RN presented the AVS utilizing Teachback Method. Patient verbalizes understanding of Discharge Instructions. Opportunity for Questioning and Answers were provided. Patient Discharged from ED ambulatory to Home via SELF.

## 2020-12-28 NOTE — ED Notes (Signed)
Patient transported to Radiology at this time.

## 2020-12-28 NOTE — ED Triage Notes (Signed)
Pt tripped on object and fell today hitting back of head and left hip, denies LOC was dizzy and nauseas

## 2020-12-28 NOTE — ED Provider Notes (Signed)
[facility] DEPT Provider Note   CSN: [id number] Arrival date & time: 12/28/20  1650     History Chief Complaint  Patient presents with   [name]    [name] is a 70 y.o. female presents to the emergency department with chief complaint of head pain and left hip pain after suffering a fall.  Patient reports that she suffered a mechanical fall approximately 1000 this morning.  Patient reports that she accidentally stepped on a small child while at work lost her balance and fell backwards striking her head.  Patient denies any loss of consciousness.  Patient reports that she felt "a little dizzy," and nauseous after her fall but was able to finish her shift without difficulty.  Dizziness and nausea have resolved.  Patient went to urgent care after completing work and was sent to the emergency department for further work-up.  Patient not on any blood thinners.  Patient endorses pain to occipital region of her head and left hip.  Patient rates pain 5/10 on pain scale.  Pain is worse with touch.  Patient has not tried any modalities to alleviate her symptoms.  Pain has been constant since fall.  Patient reports neuropathy to bilateral lower feet.  Denies any new numbness.  Patient endorses chronic left-sided neck and back pain.  Back pain is unchanged after fall.  Patient denies any headache, visual disturbance, seizure, facial asymmetry, aphasia, dysarthria, saddle anesthesia, bowel or bladder dysfunction, weakness, vomiting.   Fall Pertinent negatives include no chest pain, no abdominal pain, no headaches and no shortness of breath.      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[place])    Anxiety    Arthritis    Diabetes mellitus without complication (HCC)    GERD (gastroesophageal reflux disease)    Heart murmur    Hyperlipidemia    Hypertension    Neuromuscular disorder (HCC)    Palpitations    Pneumonia    PONV (postoperative  nausea and vomiting)    PTSD (post-traumatic stress disorder)    Ringing of ears, left     Patient Active Problem List   Diagnosis Date Noted   Diabetic peripheral neuropathy ([place]) 12/06/2020   Anxiety disorder, unspecified 01/27/2020   HNP (herniated nucleus pulposus), lumbar 01/13/2020   PTSD (post-traumatic stress disorder)    Polyneuropathy associated with underlying disease ([place]) 07/26/2019   Allergic rhinitis 07/26/2019   Atherosclerosis of aorta ([place]) 04/04/2019   Myalgia due to statin 11/18/2017   Depression, major, recurrent, in partial remission ([place]) 10/16/2017   Insomnia 10/16/2017   Chronic pain syndrome 09/07/2017   Spinal stenosis of lumbar region 07/08/2017   DOE (dyspnea on exertion) 12/16/2016   Family history of early CAD 12/16/2016   Other chest pain 12/16/2016   Arthritis of left acromioclavicular joint 08/13/2016   Left shoulder pain 05/12/2016   Cervical spondylosis without myelopathy 05/12/2016   Rotator cuff syndrome, left 05/12/2016   Restless leg syndrome 05/12/2016   Primary osteoarthritis, left hand 05/12/2016   MELENA 10/18/2008   GASTROPARESIS 05/30/2008   Type 2 diabetes mellitus with neurological complications ([place]) 15/61/5379   HYPERCHOLESTEROLEMIA 05/04/2008   HYPOKALEMIA 05/04/2008   GERD 05/04/2008   Constipation 05/04/2008   HEMATEMESIS 05/04/2008   Essential hypertension 05/04/2008    Past Surgical History:  Procedure Laterality Date   ABDOMINAL HYSTERECTOMY     partial   ACHILLES TENDON SURGERY Left    APPENDECTOMY     BACK SURGERY  BLADDER SUSPENSION     BREAST SURGERY Right 2003   milk duct removed   CHOLECYSTECTOMY     FRACTURE SURGERY     collar bone   NOSE SURGERY     x 2    OTHER SURGICAL HISTORY     Breast Duct removed   PARTIAL HYSTERECTOMY     RECTOCELE REPAIR     TONSILLECTOMY     VASCULAR SURGERY Right    right leg   VEIN SURGERY Right    leg     OB History   No obstetric history on file.      Family History  Problem Relation Age of Onset   Diabetes Mother    Alzheimer's disease Mother    Lung cancer Father    Esophageal cancer Brother    Colon cancer Neg Hx     Social History   Tobacco Use   Smoking status: Never   Smokeless tobacco: Never  Vaping Use   Vaping Use: Never used  Substance Use Topics   Alcohol use: No   Drug use: No    Home Medications Prior to Admission medications   Medication Sig Start Date End Date Taking? Authorizing Provider  albuterol (VENTOLIN HFA) 108 (90 Base) MCG/ACT inhaler INHALE 2 PUFFS INTO THE LUNGS EVERY 6 HOURS AS NEEDED FOR WHEEZING OR SHORTNESS OF BREATH 03/02/19  Yes [place], Betty G, MD  clonazePAM (KLONOPIN) 1 MG tablet Take 1 tablet (1 mg total) by mouth 2 (two) times daily as needed for anxiety. 09/24/20  Yes [place], Betty G, MD  Dulaglutide (TRULICITY) 1.5 OM/3.5DH SOPN Inject 1.5 mg into the skin once a week. 04/17/20  Yes [place], Betty G, MD  gabapentin (NEURONTIN) 600 MG tablet Take 1 tablet (600 mg total) by mouth in the morning, at noon, in the evening, and at bedtime. Discontinued 400 mg prescription from her profile. 12/14/20  Yes Bayard Hugger, NP  hydrochlorothiazide (HYDRODIURIL) 25 MG tablet TAKE 1 TABLET BY MOUTH EVERY DAY 07/02/20  Yes [place], Betty G, MD  HYDROcodone-acetaminophen (NORCO) 7.5-325 MG tablet Take 1 tablet by mouth every 6 (six) hours as needed for moderate pain. 12/11/20  Yes Danella Sensing L, NP  ipratropium (ATROVENT) 0.06 % nasal spray Place 2 sprays into both nostrils 4 (four) times daily. 11/02/20  Yes [place], Betty G, MD  omeprazole (PRILOSEC) 40 MG capsule Take 1 capsule (40 mg total) by mouth daily. 05/18/20  Yes [place], Betty G, MD  potassium chloride SA (KLOR-CON) 20 MEQ tablet Take 1 tablet (20 mEq total) by mouth daily. 12/18/20  Yes [place], Betty G, MD  QUEtiapine (SEROQUEL) 100 MG tablet Take 0.5 tablets (50 mg total) by mouth at bedtime. 07/31/20  Yes [place], Betty G, MD  triamcinolone  (KENALOG) 0.1 % paste Use as directed 1 application in the mouth or throat 2 (two) times daily. 09/04/20  Yes [place], Betty G, MD  ACCU-CHEK SOFTCLIX LANCETS lancets Use to test blood sugar 3 times daily. Dx: E11.8 08/11/17   [place], Betty G, MD  Alcohol Swabs (B-D SINGLE USE SWABS REGULAR) PADS Use to test blood sugar 3 times daily 08/11/17   [place], Betty G, MD  Blood Glucose Calibration (ACCU-CHEK AVIVA) SOLN Use as directed 08/11/17   [place], Betty G, MD  Blood Glucose Monitoring Suppl (ACCU-CHEK AVIVA PLUS) w/Device KIT Use to check blood sugar 3 times daily 07/22/18   [place], Betty G, MD  glucose blood (ACCU-CHEK AVIVA) test strip Use to test blood sugar 3 times  daily. Dx: 11.8 08/11/17   [place], Betty G, MD    Allergies    Hydromorphone, Morphine and related, Other, and Penicillins  Review of Systems   Review of Systems  Constitutional:  Negative for chills and fever.  HENT:  Negative for facial swelling.   Eyes:  Negative for visual disturbance.  Respiratory:  Negative for shortness of breath.   Cardiovascular:  Negative for chest pain.  Gastrointestinal:  Positive for nausea. Negative for abdominal pain and vomiting.  Genitourinary:  Negative for difficulty urinating and dysuria.  Musculoskeletal:  Positive for back pain (chronic lumbar) and neck pain (chronic left sided). Negative for neck stiffness.  Skin:  Negative for color change, pallor, rash and wound.  Neurological:  Negative for dizziness, tremors, seizures, syncope, facial asymmetry, speech difficulty, weakness, light-headedness, numbness and headaches.  Psychiatric/Behavioral:  Negative for confusion.    Physical Exam Updated Vital Signs BP 124/88 (BP Location: Right Arm)   Pulse 100   Temp 98.2 F (36.8 C)   Resp 16   SpO2 100%   Physical Exam Vitals and nursing note reviewed.  Constitutional:      General: She is not in acute distress.    Appearance: She is not ill-appearing, toxic-appearing or diaphoretic.  HENT:      Head: Normocephalic. Contusion present. No raccoon eyes, Battle's sign, abrasion, masses, right periorbital erythema, left periorbital erythema or laceration.      Mouth/Throat:     Mouth: Mucous membranes are moist. No injury, lacerations or angioedema.     Pharynx: Oropharynx is clear. Uvula midline. No pharyngeal swelling, oropharyngeal exudate, posterior oropharyngeal erythema or uvula swelling.  Eyes:     General: No scleral icterus.       Right eye: No discharge.        Left eye: No discharge.     Extraocular Movements: Extraocular movements intact.     Conjunctiva/sclera: Conjunctivae normal.     Pupils: Pupils are equal, round, and reactive to light.  Cardiovascular:     Rate and Rhythm: Normal rate.  Pulmonary:     Effort: Pulmonary effort is normal. No respiratory distress.  Abdominal:     Palpations: Abdomen is soft.     Tenderness: There is no abdominal tenderness. There is no guarding or rebound.  Musculoskeletal:     Cervical back: Normal range of motion and neck supple. No swelling, edema, deformity, erythema, signs of trauma, lacerations, rigidity, spasms, torticollis, tenderness, bony tenderness or crepitus. No pain with movement. Normal range of motion.     Thoracic back: No swelling, edema, deformity, signs of trauma, lacerations, spasms, tenderness or bony tenderness.     Lumbar back: No swelling, edema, deformity, signs of trauma, lacerations, spasms, tenderness or bony tenderness. Negative right straight leg raise test and negative left straight leg raise test. No scoliosis.     Right hip: No deformity, lacerations, tenderness, bony tenderness or crepitus.     Left hip: Tenderness and bony tenderness present. No deformity, lacerations or crepitus.     Right upper leg: No tenderness or bony tenderness.     Left upper leg: No tenderness or bony tenderness.     Comments: No midline tenderness or deformity to cervical, thoracic, or lumbar spine.  Patient has full  range of motion to bilateral upper and lower extremities without complaints of pain.  No pain with passive range of motion to bilateral upper or lower extremities.  Skin:    General: Skin is warm and dry.  Neurological:  General: No focal deficit present.     Mental Status: She is alert and oriented to person, place, and time.     GCS: GCS eye subscore is 4. GCS verbal subscore is 5. GCS motor subscore is 6.     Cranial Nerves: No cranial nerve deficit or facial asymmetry.     Sensory: Sensation is intact.     Motor: No weakness, tremor, seizure activity or pronator drift.     Coordination: Finger-Nose-Finger Test and Heel to [employer] normal.     Comments: CN II-XII intact; performed in supine position, +5 strength to bilateral upper extremities, +5 strength to dorsiflexion and plantarflexion, patient able to left both legs against gravity and hold each there without difficulty, sensation to light touch intact to bilateral upper and lower extremities.  Psychiatric:        Mood and Affect: Mood is anxious.        Behavior: Behavior is cooperative.    ED Results / Procedures / Treatments   Labs (all labs ordered are listed, but only abnormal results are displayed) Labs Reviewed - No data to display  EKG None  Radiology CT HEAD WO CONTRAST (5MM)  Result Date: 12/28/2020 CLINICAL DATA:  Head trauma fall EXAM: CT HEAD WITHOUT CONTRAST TECHNIQUE: Contiguous axial images were obtained from the base of the skull through the vertex without intravenous contrast. COMPARISON:  CT brain 07/02/2020 FINDINGS: Brain: No acute territorial infarction, hemorrhage or intracranial mass. Nonenlarged ventricles. Mild atrophy Vascular: No hyperdense vessels. Vertebral and carotid vascular calcification Skull: Normal. Negative for fracture or focal lesion. Sinuses/Orbits: No acute finding. Other: None IMPRESSION: 1. No CT evidence for acute intracranial abnormality. 2. Mild atrophy Electronically Signed   By:  Donavan Foil M.D.   On: 12/28/2020 20:33   DG Hip Unilat W or Wo Pelvis 2-3 Views Left  Result Date: 12/28/2020 CLINICAL DATA:  Fall EXAM: DG HIP (WITH OR WITHOUT PELVIS) 2-3V LEFT COMPARISON:  07/02/2020 FINDINGS: SI joints are non widened. Pubic symphysis and rami appear intact. No fracture or malalignment. Mild degenerative changes of both hips. Vascular calcifications IMPRESSION: No acute osseous abnormality Electronically Signed   By: Donavan Foil M.D.   On: 12/28/2020 20:34    Procedures Procedures   Medications Ordered in ED Medications - No data to display  ED Course  I have reviewed the triage vital signs and the nursing notes.  Pertinent labs & imaging results that were available during my care of the patient were reviewed by me and considered in my medical decision making (see chart for details).    MDM Rules/Calculators/A&P                           Alert 70 year old female no acute stress, nontoxic-appearing.  Presents to the emergency department with chief complaint of pain to occipital region and left hip after suffering mechanical fall earlier today.  Patient had no loss of consciousness.  Patient is not on any blood thinners.  Neurologic exam is reassuring.  Patient's age and head injury will obtain noncontrast head CT to evaluate for intracranial abnormality.  No midline tenderness or forms to cervical, thoracic, or lumbar spine.  Patient denies any bowel or bladder dysfunction, saddle anesthesia, weakness.  Suspicion for spinal injury at this time.  Patient has tenderness to left hip.  No shortening or rotation noted.  Will obtain x-ray imaging to evaluate for possible fracture.  X-ray and CT imaging unremarkable.  Patient hemodynamically stable.  Will discharge patient at this time.  Patient to follow-up with primary care provider.  Discussed results, findings, treatment and follow up. Patient advised of return precautions. Patient verbalized understanding and  agreed with plan.   Final Clinical Impression(s) / ED Diagnoses Final diagnoses:  Fall, initial encounter  Injury of head, initial encounter    Rx / DC Orders ED Discharge Orders     None        Dyann Ruddle 12/28/20 2352    Luna Fuse, MD 01/09/21 [phone]

## 2020-12-28 NOTE — ED Notes (Signed)
Patient returned from Radiology. 

## 2020-12-28 NOTE — Discharge Instructions (Addendum)
You came to the emergency department today to be evaluated after suffering a fall earlier today.  Your physical exam was reassuring the CT scan of your head showed no intracranial bleeding or other acute abnormalities.  The x-ray of your left hip showed no fractures or dislocations.  Please take Ibuprofen (Advil, motrin) and Tylenol (acetaminophen) to relieve your pain.    You may take up to 600 MG (3 pills) of normal strength ibuprofen every 8 hours as needed.   You make take tylenol, up to 1,000 mg (two extra strength pills) every 8 hours as needed.   It is safe to take ibuprofen and tylenol at the same time as they work differently.   Do not take more than 3,000 mg tylenol in a 24 hour period (not more than one dose every 8 hours.  Please check all medication labels as many medications such as pain and cold medications may contain tylenol.  Do not drink alcohol while taking these medications.  Do not take other NSAID'S while taking ibuprofen (such as aleve or naproxen).  Please take ibuprofen with food to decrease stomach upset.  Get help right away if: You have: A severe headache that is not helped by medicine. Trouble walking or weakness in your arms and legs. Clear or bloody fluid coming from your nose or ears. Changes in your vision. A seizure. Increased confusion or irritability. Your symptoms get worse. You are sleepier than normal and have trouble staying awake. You lose your balance. Your pupils change size. Your speech is slurred. Your dizziness gets worse. You vomit.

## 2020-12-31 NOTE — Telephone Encounter (Signed)
I have not been able to reach patient in window She is being evaluated for surgery    I cannot say that she would be low risk without a stress test being done   The test is very low risk    If it is abnormal it would be good to identify

## 2021-01-02 ENCOUNTER — Other Ambulatory Visit: Payer: Self-pay | Admitting: Family Medicine

## 2021-01-02 DIAGNOSIS — J989 Respiratory disorder, unspecified: Secondary | ICD-10-CM

## 2021-01-07 NOTE — Telephone Encounter (Signed)
Pt requesting to switch providers. States that she would like to be seen by another provider in the [place] office. Offered another provider in the [place] office and pt declined. Please advise.

## 2021-01-08 ENCOUNTER — Ambulatory Visit: Payer: Medicare Other | Admitting: Family Medicine

## 2021-01-08 DIAGNOSIS — M5440 Lumbago with sciatica, unspecified side: Secondary | ICD-10-CM | POA: Diagnosis not present

## 2021-01-08 DIAGNOSIS — G629 Polyneuropathy, unspecified: Secondary | ICD-10-CM | POA: Diagnosis not present

## 2021-01-09 NOTE — Telephone Encounter (Signed)
Called to notify pt. No answer. Left msg to call back.  

## 2021-01-10 DIAGNOSIS — E1140 Type 2 diabetes mellitus with diabetic neuropathy, unspecified: Secondary | ICD-10-CM | POA: Diagnosis not present

## 2021-01-10 DIAGNOSIS — I739 Peripheral vascular disease, unspecified: Secondary | ICD-10-CM | POA: Diagnosis not present

## 2021-01-10 DIAGNOSIS — M79672 Pain in left foot: Secondary | ICD-10-CM | POA: Diagnosis not present

## 2021-01-10 DIAGNOSIS — M79671 Pain in right foot: Secondary | ICD-10-CM | POA: Diagnosis not present

## 2021-01-11 ENCOUNTER — Telehealth: Payer: Self-pay | Admitting: Registered Nurse

## 2021-01-11 MED ORDER — HYDROCODONE-ACETAMINOPHEN 7.5-325 MG PO TABS: 1.0000 | ORAL_TABLET | Freq: Four times a day (QID) | ORAL | 0 refills | Status: DC | PRN

## 2021-01-11 NOTE — Telephone Encounter (Signed)
Hydrocodone e-scribed today. Ms. Minetti aware via My-Chart message.

## 2021-01-15 ENCOUNTER — Encounter: Payer: Self-pay | Admitting: Family Medicine

## 2021-01-15 MED ORDER — TRULICITY 1.5 MG/0.5ML ~~LOC~~ SOAJ: 1.5000 mg | SUBCUTANEOUS | 3 refills | Status: DC

## 2021-01-16 ENCOUNTER — Encounter: Payer: Medicare Other | Admitting: Physical Medicine & Rehabilitation

## 2021-01-16 NOTE — Telephone Encounter (Signed)
Spoke with pt and asked that she go on the [employer] [facility] website and choose a provider that she would like to see. Pt declined and states that she would like our office to choose a provider for her. Pt states that she would now like to be seen in our [place] office  by a [employer].

## 2021-01-17 NOTE — Telephone Encounter (Signed)
As discussed with Dr.Schumann, will forward to him.

## 2021-01-21 ENCOUNTER — Other Ambulatory Visit: Payer: Medicare Other

## 2021-01-21 DIAGNOSIS — M5416 Radiculopathy, lumbar region: Secondary | ICD-10-CM | POA: Diagnosis not present

## 2021-01-22 NOTE — Telephone Encounter (Signed)
I contacted patient   TOld her I had tried to reach several times in the past  She says she got very anxious on day of stress test   Backed out  Discussed monitor and higher frequency of PVCs    Need to r/o ischemia as a potential cause   REcomm Lexiscan myoview   Described  test   Safe  Can have some side effects   Described    She agreees to proceed  Please sched a lexiscan myoview     I will follow with results

## 2021-01-23 ENCOUNTER — Telehealth: Payer: Self-pay

## 2021-01-23 NOTE — Telephone Encounter (Signed)
Lmom to call back to schedule Lexiscan

## 2021-01-25 NOTE — Telephone Encounter (Signed)
Lexiscan scheduled for 10/31 at 11 am. Pt will need to arrive no later than 9:15 am.

## 2021-01-28 ENCOUNTER — Other Ambulatory Visit: Payer: Self-pay

## 2021-01-28 ENCOUNTER — Other Ambulatory Visit (INDEPENDENT_AMBULATORY_CARE_PROVIDER_SITE_OTHER): Payer: Medicare Other

## 2021-01-28 DIAGNOSIS — E785 Hyperlipidemia, unspecified: Secondary | ICD-10-CM

## 2021-01-28 DIAGNOSIS — I10 Essential (primary) hypertension: Secondary | ICD-10-CM

## 2021-01-28 DIAGNOSIS — E876 Hypokalemia: Secondary | ICD-10-CM | POA: Diagnosis not present

## 2021-01-28 DIAGNOSIS — E1169 Type 2 diabetes mellitus with other specified complication: Secondary | ICD-10-CM | POA: Diagnosis not present

## 2021-01-28 DIAGNOSIS — E1149 Type 2 diabetes mellitus with other diabetic neurological complication: Secondary | ICD-10-CM

## 2021-01-28 DIAGNOSIS — R252 Cramp and spasm: Secondary | ICD-10-CM | POA: Diagnosis not present

## 2021-01-28 LAB — LIPID PANEL
Cholesterol: 261 mg/dL — ABNORMAL HIGH (ref 0–200)
HDL: 43.7 mg/dL (ref >39.00)
NonHDL: 217.4
Total CHOL/HDL Ratio: 6
Triglycerides: 295 mg/dL — ABNORMAL HIGH (ref 0.0–149.0)
VLDL: 59 mg/dL — ABNORMAL HIGH (ref 0.0–40.0)

## 2021-01-28 LAB — MAGNESIUM: Magnesium: 1.6 mg/dL (ref 1.5–2.5)

## 2021-01-28 LAB — BASIC METABOLIC PANEL
BUN: 17 mg/dL (ref 6–23)
CO2: 31 mEq/L (ref 19–32)
Calcium: 9.5 mg/dL (ref 8.4–10.5)
Chloride: 100 mEq/L (ref 96–112)
Creatinine, Ser: 0.95 mg/dL (ref 0.40–1.20)
GFR: 61.32 mL/min (ref >60.00)
Glucose, Bld: 133 mg/dL — ABNORMAL HIGH (ref 70–99)
Potassium: 4.3 mEq/L (ref 3.5–5.1)
Sodium: 139 mEq/L (ref 135–145)

## 2021-01-28 LAB — LDL CHOLESTEROL, DIRECT: Direct LDL: 140 mg/dL

## 2021-01-28 LAB — TSH: TSH: 0.49 u[IU]/mL (ref 0.35–5.50)

## 2021-01-30 ENCOUNTER — Encounter: Payer: Self-pay | Admitting: Family Medicine

## 2021-01-30 MED ORDER — EZETIMIBE 10 MG PO TABS: 10.0000 mg | ORAL_TABLET | Freq: Every day | ORAL | 3 refills | Status: DC

## 2021-02-04 ENCOUNTER — Encounter (HOSPITAL_COMMUNITY)
Admission: RE | Admit: 2021-02-04 | Discharge: 2021-02-04 | Disposition: A | Discharge status: Home / Self Care | Payer: Medicare Other | Source: Ambulatory Visit | Attending: Internal Medicine | Admitting: Internal Medicine

## 2021-02-04 ENCOUNTER — Other Ambulatory Visit: Payer: Self-pay

## 2021-02-04 ENCOUNTER — Ambulatory Visit (HOSPITAL_COMMUNITY)
Admission: RE | Admit: 2021-02-04 | Discharge: 2021-02-04 | Disposition: A | Discharge status: Home / Self Care | Payer: Medicare Other | Source: Ambulatory Visit | Attending: Internal Medicine | Admitting: Internal Medicine

## 2021-02-04 DIAGNOSIS — I493 Ventricular premature depolarization: Secondary | ICD-10-CM | POA: Insufficient documentation

## 2021-02-04 DIAGNOSIS — R008 Other abnormalities of heart beat: Secondary | ICD-10-CM | POA: Diagnosis not present

## 2021-02-04 DIAGNOSIS — I998 Other disorder of circulatory system: Secondary | ICD-10-CM | POA: Diagnosis not present

## 2021-02-04 LAB — NM MYOCAR MULTI W/SPECT W/WALL MOTION / EF
LV dias vol: 74 mL (ref 46–106)
LV sys vol: 41 mL
Nuc Stress EF: 44 %
Peak HR: 101 {beats}/min
RATE: 0.4
Rest HR: 85 {beats}/min
Rest Nuclear Isotope Dose: 9 mCi
SDS: 6
SRS: 13
SSS: 19
Stress Nuclear Isotope Dose: 28 mCi
TID: 1.45

## 2021-02-04 MED ORDER — TECHNETIUM TC 99M TETROFOSMIN IV KIT
30.0000 | PACK | Freq: Once | INTRAVENOUS | Status: AC | PRN
  Administered 2021-02-04: 28 via INTRAVENOUS

## 2021-02-04 MED ORDER — REGADENOSON 0.4 MG/5ML IV SOLN
INTRAVENOUS | Status: AC
  Administered 2021-02-04: 0.4 mg via INTRAVENOUS
  Filled 2021-02-04: qty 5

## 2021-02-04 MED ORDER — TECHNETIUM TC 99M TETROFOSMIN IV KIT
10.0000 | PACK | Freq: Once | INTRAVENOUS | Status: AC | PRN
  Administered 2021-02-04: 9 via INTRAVENOUS

## 2021-02-04 MED ORDER — SODIUM CHLORIDE FLUSH 0.9 % IV SOLN
INTRAVENOUS | Status: AC
  Administered 2021-02-04: 10 mL via INTRAVENOUS
  Filled 2021-02-04: qty 10

## 2021-02-08 ENCOUNTER — Encounter: Payer: Self-pay | Admitting: Family Medicine

## 2021-02-08 MED ORDER — OMEPRAZOLE 40 MG PO CPDR: 40.0000 mg | DELAYED_RELEASE_CAPSULE | Freq: Every day | ORAL | 2 refills | Status: DC

## 2021-02-11 ENCOUNTER — Other Ambulatory Visit: Payer: Self-pay

## 2021-02-11 ENCOUNTER — Encounter: Payer: Medicare Other | Attending: Physical Medicine & Rehabilitation | Admitting: Registered Nurse

## 2021-02-11 VITALS — BP 110/73 | HR 70 | Ht 68.0 in | Wt 183.0 lb

## 2021-02-11 DIAGNOSIS — Z79891 Long term (current) use of opiate analgesic: Secondary | ICD-10-CM | POA: Diagnosis not present

## 2021-02-11 DIAGNOSIS — M79605 Pain in left leg: Secondary | ICD-10-CM | POA: Insufficient documentation

## 2021-02-11 DIAGNOSIS — M47812 Spondylosis without myelopathy or radiculopathy, cervical region: Secondary | ICD-10-CM | POA: Insufficient documentation

## 2021-02-11 DIAGNOSIS — G894 Chronic pain syndrome: Secondary | ICD-10-CM | POA: Diagnosis not present

## 2021-02-11 DIAGNOSIS — M5416 Radiculopathy, lumbar region: Secondary | ICD-10-CM | POA: Insufficient documentation

## 2021-02-11 DIAGNOSIS — M5412 Radiculopathy, cervical region: Secondary | ICD-10-CM | POA: Insufficient documentation

## 2021-02-11 DIAGNOSIS — M79604 Pain in right leg: Secondary | ICD-10-CM | POA: Diagnosis not present

## 2021-02-11 DIAGNOSIS — Z5181 Encounter for therapeutic drug level monitoring: Secondary | ICD-10-CM | POA: Insufficient documentation

## 2021-02-11 DIAGNOSIS — M542 Cervicalgia: Secondary | ICD-10-CM | POA: Diagnosis not present

## 2021-02-11 DIAGNOSIS — E1142 Type 2 diabetes mellitus with diabetic polyneuropathy: Secondary | ICD-10-CM | POA: Diagnosis not present

## 2021-02-11 MED ORDER — HYDROCODONE-ACETAMINOPHEN 7.5-325 MG PO TABS: 1.0000 | ORAL_TABLET | Freq: Four times a day (QID) | ORAL | 0 refills | Status: DC | PRN

## 2021-02-11 NOTE — Progress Notes (Signed)
Subjective:    Patient ID: [name], female    DOB: March 31, 1951, 70 y.o.   MRN: [id number]  HPI: [name] Finger is a 70 y.o. female who returns for follow up appointment for chronic pain and medication refill. She states her pain is located in her neck radiating into her lef shoulder, lower back radiating into her left buttock and left lower extremity. She also reports bilateral feet pain, she describes the pain as numbness. She rates her pain 9. Her current exercise regime is walking and performing stretching exercises.  [name] Morphine equivalent is 30 MME. She  is also prescribed Clonazepam  by Dr. Martinique  .We have discussed the black box warning of using opioids and benzodiazepines. I highlighted the dangers of using these drugs together and discussed the adverse events including respiratory suppression, overdose, cognitive impairment and importance of compliance with current regimen. We will continue to monitor and adjust as indicated.     Last oral Swab was Performed on 12/11/2020, it was consistent.     Pain Inventory Average Pain 9 Pain Right Now 9 My pain is sharp, burning, tingling, and aching  In the last 24 hours, has pain interfered with the following? General activity 7 Relation with others 7 Enjoyment of life 7 What TIME of day is your pain at its worst? morning , daytime, evening, and night Sleep (in general) Poor  Pain is worse with: bending, sitting, inactivity, standing, and some activites Pain improves with: rest, heat/ice, therapy/exercise, pacing activities, medication, TENS, and injections Relief from Meds: 10  Family History  Problem Relation Age of Onset   Diabetes Mother    Alzheimer's disease Mother    Lung cancer Father    Esophageal cancer Brother    Colon cancer Neg Hx    Social History   Socioeconomic History   Marital status: Divorced    Spouse name: Not on file   Number of children: Not on file   Years of education: Not on  file   Highest education level: Not on file  Occupational History   Not on file  Tobacco Use   Smoking status: Never   Smokeless tobacco: Never  Vaping Use   Vaping Use: Never used  Substance and Sexual Activity   Alcohol use: No   Drug use: No   Sexual activity: Not on file  Other Topics Concern   Not on file  Social History Narrative   Not on file   Social Determinants of Health   Financial Resource Strain: Low Risk    Difficulty of Paying Living Expenses: Not hard at all  Food Insecurity: No Food Insecurity   Worried About Charity fundraiser in the Last Year: Never true   Ran Out of Food in the Last Year: Never true  Transportation Needs: No Transportation Needs   Lack of Transportation (Medical): No   Lack of Transportation (Non-Medical): No  Physical Activity: Insufficiently Active   Days of Exercise per Week: 2 days   Minutes of Exercise per Session: 40 min  Stress: No Stress Concern Present   Feeling of Stress : Not at all  Social Connections: Moderately Integrated   Frequency of Communication with Friends and Family: Twice a week   Frequency of Social Gatherings with Friends and Family: Twice a week   Attends Religious Services: More than 4 times per year   Active Member of [employer] or Organizations: Yes   Attends Archivist Meetings: 1 to 4 times  per year   Marital Status: Widowed   Past Surgical History:  Procedure Laterality Date   ABDOMINAL HYSTERECTOMY     partial   ACHILLES TENDON SURGERY Left    APPENDECTOMY     BACK SURGERY     BLADDER SUSPENSION     BREAST SURGERY Right 2003   milk duct removed   CHOLECYSTECTOMY     FRACTURE SURGERY     collar bone   NOSE SURGERY     x 2    OTHER SURGICAL HISTORY     Breast Duct removed   PARTIAL HYSTERECTOMY     RECTOCELE REPAIR     TONSILLECTOMY     VASCULAR SURGERY Right    right leg   VEIN SURGERY Right    leg   Past Surgical History:  Procedure Laterality Date   ABDOMINAL HYSTERECTOMY      partial   ACHILLES TENDON SURGERY Left    APPENDECTOMY     BACK SURGERY     BLADDER SUSPENSION     BREAST SURGERY Right 2003   milk duct removed   CHOLECYSTECTOMY     FRACTURE SURGERY     collar bone   NOSE SURGERY     x 2    OTHER SURGICAL HISTORY     Breast Duct removed   PARTIAL HYSTERECTOMY     RECTOCELE REPAIR     TONSILLECTOMY     VASCULAR SURGERY Right    right leg   VEIN SURGERY Right    leg   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HCC)    Anxiety    Arthritis    Diabetes mellitus without complication (HCC)    GERD (gastroesophageal reflux disease)    Heart murmur    Hyperlipidemia    Hypertension    Neuromuscular disorder (HCC)    Palpitations    Pneumonia    PONV (postoperative nausea and vomiting)    PTSD (post-traumatic stress disorder)    Ringing of ears, left    BP 110/73   Pulse 70   Ht 5\' 8"  (1.727 m)   Wt 183 lb (83 kg)   SpO2 95%   BMI 27.83 kg/m   Opioid Risk Score:   Fall Risk Score:  `1  Depression screen PHQ 2/9  Depression screen [facility] 2/9 12/06/2020 11/06/2020 11/06/2020 10/11/2020 08/24/2020 04/10/2020 12/30/2019  Decreased Interest 1 0 0 1 2 1 1   Down, Depressed, Hopeless 1 0 0 1 2 1 1   PHQ - 2 Score 2 0 0 2 4 2 2   Altered sleeping - - - - 3 - -  Tired, decreased energy - - - - 2 - -  Change in appetite - - - - 1 - -  Feeling bad or failure about yourself  - - - - 0 - -  Trouble concentrating - - - - 0 - -  Moving slowly or fidgety/restless - - - - 0 - -  Suicidal thoughts - - - - 0 - -  PHQ-9 Score - - - - 10 - -  Difficult doing work/chores - - - - Very difficult - -  Some recent data might be hidden     Review of Systems  Musculoskeletal:  Positive for back pain and neck pain.       Calf pain Left hamstring pain Feet pain  All other systems reviewed and are negative.     Objective:   Physical Exam Vitals and nursing  note reviewed.  Constitutional:      Appearance: Normal appearance.   Cardiovascular:     Rate and Rhythm: Normal rate and regular rhythm.     Pulses: Normal pulses.     Heart sounds: Normal heart sounds.  Pulmonary:     Effort: Pulmonary effort is normal.     Breath sounds: Normal breath sounds.  Musculoskeletal:     Cervical back: Normal range of motion and neck supple.     Comments: Normal Muscle Bulk and Muscle Testing Reveals:  Upper Extremities: Right: Full ROM and Muscle Strength 5/5 Left Upper Extremity: Decreased ROM 45 Degrees and Muscle Strength 5/5 Thoracic Hypersensitivity: T-1-T-3 Lumbar Paraspinal Tenderness: L-4-L-5 Mainly Left Side Lower Extremities : Right: Full ROM and Muscle Strength 5/5 Left Lower Extremity: Decreased ROM and Muscle Strength 5/5 Left Lower Extremity Flexion Produces pain into her Lumbar and Left Hip Arises from chair slowly Antalgic Gait     Skin:    General: Skin is warm and dry.  Neurological:     Mental Status: She is alert and oriented to person, place, and time.  Psychiatric:        Mood and Affect: Mood normal.        Behavior: Behavior normal.          Assessment & Plan:  1.Chronic cervicalgia with documented spondylosis on MRI, per Dr. Naaman Plummer Note and Cervical Radiculitis: Continue Gabapentin,  Continue HEP as Tolerated. 02/11/2021 2. Left shoulder pain most consistent with left rotator cuff syndrome. Mild DJD.  Continue HEP as tolerated.Continue to monitor. 02/11/2021. Refilled: Hydrocodone 7.5/325 mg one tablet every 6 hours as needed for pain. #120. Second script sent for the following month. We will continue the opioid monitoring program, this consists of regular clinic visits, examinations, urine drug screen, pill counts as well as use of [place] Controlled Substance Reporting system. A 12 month History has been reviewed on the [place] on 02/11/2021. 3. Left wrist/finger pain most consistent with OA, ?post traumatic. No complaints today.  02/11/2021 4. Anxiety: Continue Klonopin, PCP prescribing. Continue to monitor. 02/11/2021 5. Restless Leg syndrome: Continue Gabapentin. 02/11/2021 7. Sacroiliac inflammation: S/P Right  SI injection on 09/20/2019, with No relief noted. Ms. Mehlman seen Dr Marlou Sa in regards to her pain. Dr Saintclair Halsted. Following.  Continue to Monitor. 02/11/2021. 8. Myofascial Pain: Continue Robaxin as needed. 02/11/2021.  9. Bilateral feet with neuropathic pain: Increase Gabapentin 600 mg 4 times a day. Continue to monitor. 02/11/2021. 10.  Herniated Nucleus Pulposus L2-3 with recurrent disc rupture: S/P  Posterior Lumbar Interbody Fusion  - Lumbar two-Lumbar three on 01/13/2020 by Dr Saintclair Halsted.  Dr Saintclair Halsted Following. 02/11/2021   F/U in 2 months

## 2021-02-13 ENCOUNTER — Encounter: Payer: Self-pay | Admitting: Registered Nurse

## 2021-02-14 ENCOUNTER — Other Ambulatory Visit: Payer: Self-pay | Admitting: Student

## 2021-02-14 ENCOUNTER — Ambulatory Visit: Payer: Medicare Other | Admitting: Student

## 2021-02-14 ENCOUNTER — Encounter: Payer: Self-pay | Admitting: Student

## 2021-02-14 VITALS — BP 142/80 | HR 72 | Ht 68.0 in | Wt 188.0 lb

## 2021-02-14 DIAGNOSIS — I493 Ventricular premature depolarization: Secondary | ICD-10-CM | POA: Diagnosis not present

## 2021-02-14 DIAGNOSIS — E782 Mixed hyperlipidemia: Secondary | ICD-10-CM

## 2021-02-14 DIAGNOSIS — Z01818 Encounter for other preprocedural examination: Secondary | ICD-10-CM

## 2021-02-14 DIAGNOSIS — R9439 Abnormal result of other cardiovascular function study: Secondary | ICD-10-CM

## 2021-02-14 DIAGNOSIS — I10 Essential (primary) hypertension: Secondary | ICD-10-CM

## 2021-02-14 MED ORDER — METOPROLOL SUCCINATE ER 25 MG PO TB24: 12.5000 mg | ORAL_TABLET | Freq: Every evening | ORAL | 3 refills | Status: DC

## 2021-02-14 NOTE — H&P (View-Only) (Signed)
 Cardiology Office Note    Date:  02/14/2021   ID:  [name], DOB 04/09/1950, MRN [id number]  PCP:  Jordan, Betty G, MD  Cardiologist: Paula Ross, MD    Chief Complaint  Patient presents with   Follow-up    Abnormal Stress Test    History of Present Illness:    [name] is a 70 y.o. female with past medical history of palpitations, HTN, HLD, Type 2 DM, anxiety and PTSD who presents to the office today for follow-up from her recent abnormal stress test.   She was examined by Dr. Ross in 09/2020 as a new patient referral for an irregular pulse and bradycardia she reported her heart rate was in the 40's at times and she did experience occasional dizziness but no syncopal episodes. Prior EKG's had demonstrated frequent PVC's and a 48-hour Holter monitor was recommended for further evaluation. This showed predominantly normal sinus rhythm with an average heart rate of 81 bpm. She did have frequent PVC's representing 24% of total beats and also had episodes of ventricular bigeminy and trigeminy. An echocardiogram was obtained for further evaluation and showed a preserved EF of 55 to 60% with no regional wall motion abnormalities. She did not have any significant valve abnormalities. Given the need for possible upcoming back surgery, a stress test was recommended for further evaluation and showed medium to large defects in the apical anterior/anterior septal distribution as well as basal inferior lateral distribution which was suggestive of scar and mild peri-infarct ischemia. Was overall felt to be a high risk study and Dr. Ross recommended a cardiac catheterization for definitive evaluation. She did call the patient and reviewed this along with risks and benefits and she was in agreement to proceed but presents to the office for an updated H&P.   In talking with the patient today, she does report having palpitations which typically are most notable at night and this has been  occurring for a few years per her report. Describes it as her heart skipping. She denies any recent chest pain or dyspnea on exertion. No specific orthopnea, PND or pitting edema.  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HCC)    Anxiety    Arthritis    Diabetes mellitus without complication (HCC)    GERD (gastroesophageal reflux disease)    Heart murmur    Hyperlipidemia    Hypertension    Neuromuscular disorder (HCC)    Palpitations    Pneumonia    PONV (postoperative nausea and vomiting)    PTSD (post-traumatic stress disorder)    Ringing of ears, left     Past Surgical History:  Procedure Laterality Date   ABDOMINAL HYSTERECTOMY     partial   ACHILLES TENDON SURGERY Left    APPENDECTOMY     BACK SURGERY     BLADDER SUSPENSION     BREAST SURGERY Right 2003   milk duct removed   CHOLECYSTECTOMY     FRACTURE SURGERY     collar bone   NOSE SURGERY     x 2    OTHER SURGICAL HISTORY     Breast Duct removed   PARTIAL HYSTERECTOMY     RECTOCELE REPAIR     TONSILLECTOMY     VASCULAR SURGERY Right    right leg   VEIN SURGERY Right    leg    Current Medications: Outpatient Medications Prior to Visit  Medication Sig Dispense Refill   ACCU-CHEK SOFTCLIX LANCETS   lancets Use to test blood sugar 3 times daily. Dx: E11.8 100 each 12   albuterol (VENTOLIN HFA) 108 (90 Base) MCG/ACT inhaler INHALE 2 PUFFS INTO THE LUNGS EVERY 6 HOURS AS NEEDED FOR WHEEZING OR SHORTNESS OF BREATH 18 g 2   Alcohol Swabs (B-D SINGLE USE SWABS REGULAR) PADS Use to test blood sugar 3 times daily 100 each 3   Blood Glucose Calibration (ACCU-CHEK AVIVA) SOLN Use as directed 1 each 2   Blood Glucose Monitoring Suppl (ACCU-CHEK AVIVA PLUS) w/Device KIT Use to check blood sugar 3 times daily 1 kit 1   clonazePAM (KLONOPIN) 1 MG tablet Take 1 tablet (1 mg total) by mouth 2 (two) times daily as needed for anxiety. 60 tablet 3   Dulaglutide (TRULICITY) 1.5 MG/0.5ML SOPN Inject 1.5 mg  into the skin once a week. 13 mL 3   ezetimibe (ZETIA) 10 MG tablet Take 1 tablet (10 mg total) by mouth daily. 30 tablet 3   gabapentin (NEURONTIN) 600 MG tablet Take 1 tablet (600 mg total) by mouth in the morning, at noon, in the evening, and at bedtime. Discontinued 400 mg prescription from her profile. 120 tablet 3   glucose blood (ACCU-CHEK AVIVA) test strip Use to test blood sugar 3 times daily. Dx: 11.8 100 each 12   hydrochlorothiazide (HYDRODIURIL) 25 MG tablet TAKE 1 TABLET BY MOUTH EVERY DAY 90 tablet 3   HYDROcodone-acetaminophen (NORCO) 7.5-325 MG tablet Take 1 tablet by mouth every 6 (six) hours as needed for moderate pain. 120 tablet 0   ipratropium (ATROVENT) 0.06 % nasal spray Place 2 sprays into both nostrils 4 (four) times daily. 15 mL 3   omeprazole (PRILOSEC) 40 MG capsule Take 1 capsule (40 mg total) by mouth daily. 90 capsule 2   potassium chloride SA (KLOR-CON) 20 MEQ tablet Take 1 tablet (20 mEq total) by mouth daily. 30 tablet 3   QUEtiapine (SEROQUEL) 100 MG tablet Take 0.5 tablets (50 mg total) by mouth at bedtime. 45 tablet 3   triamcinolone (KENALOG) 0.1 % paste Use as directed 1 application in the mouth or throat 2 (two) times daily. 5 g 1   No facility-administered medications prior to visit.     Allergies:   Hydromorphone, Morphine and related, Other, and Penicillins   Social History   Socioeconomic History   Marital status: Divorced    Spouse name: Not on file   Number of children: Not on file   Years of education: Not on file   Highest education level: Not on file  Occupational History   Not on file  Tobacco Use   Smoking status: Never   Smokeless tobacco: Never  Vaping Use   Vaping Use: Never used  Substance and Sexual Activity   Alcohol use: No   Drug use: No   Sexual activity: Not on file  Other Topics Concern   Not on file  Social History Narrative   Not on file    Family History:  The patient's family history includes Alzheimer's  disease in her mother; Diabetes in her mother; Esophageal cancer in her brother; Lung cancer in her father.   Review of Systems:    Please see the history of present illness.     All other systems reviewed and are otherwise negative except as noted above.   Physical Exam:    VS:  BP (!) 142/80   Pulse 72   Ht 5' 8" (1.727 m)   Wt 188 lb (85.3 kg)     SpO2 98%   BMI 28.59 kg/m    General: Well developed, well nourished,female appearing in no acute distress. Head: Normocephalic, atraumatic. Neck: No carotid bruits. JVD not elevated.  Lungs: Respirations regular and unlabored, without wheezes or rales.  Heart: Regular rate and rhythm. No S3 or S4.  No murmur, no rubs, or gallops appreciated. Abdomen: Appears non-distended. No obvious abdominal masses. Msk:  Strength and tone appear normal for age. No obvious joint deformities or effusions. Extremities: No clubbing or cyanosis. No pitting edema.  Distal pedal pulses are 2+ bilaterally. Neuro: Alert and oriented X 3. Moves all extremities spontaneously. No focal deficits noted. Psych:  Responds to questions appropriately with a normal affect. Skin: No rashes or lesions noted  Wt Readings from Last 3 Encounters:  02/14/21 188 lb (85.3 kg)  02/11/21 183 lb (83 kg)  12/18/20 181 lb (82.1 kg)      Studies/Labs Reviewed:   EKG:  EKG is ordered today.  The ekg ordered today demonstrates NSR, HR 72 with no acute ST changes when compared to prior tracings.  Recent Labs: 07/02/2020: ALT 19; Hemoglobin 13.5; Platelets 240 01/28/2021: BUN 17; Creatinine, Ser 0.95; Magnesium 1.6; Potassium 4.3; Sodium 139; TSH 0.49   Lipid Panel    Component Value Date/Time   CHOL 261 (H) 01/28/2021 1007   CHOL 183 12/16/2016 1517   TRIG 295.0 (H) 01/28/2021 1007   HDL 43.70 01/28/2021 1007   HDL 53 12/16/2016 1517   CHOLHDL 6 01/28/2021 1007   VLDL 59.0 (H) 01/28/2021 1007   LDLCALC 84 12/16/2016 1517   LDLDIRECT 140.0 01/28/2021 1007     Additional studies/ records that were reviewed today include:   Event Monitor: 09/2020 ZIO XT reviewed.  2 days 9 hours analyzed.  Predominant rhythm is sinus with heart rate ranging from 56 bpm up to 117 bpm and average heart rate 81 bpm.  There were rare PACs including couplets and triplets representing less than 1% total beats.  There were frequent PVCs representing approximately 24% total beats.  Also rare ventricular couplets and triplets representing less than 1% total beats and episodes of ventricular bigeminy and trigeminy.  No sustained ventricular events.  There were 4 episodes of SVT, the longest of which lasted for 13 beats.  No pauses.  Echocardiogram: 09/2020 IMPRESSIONS     1. Left ventricular ejection fraction, by estimation, is 55 to 60%. The  left ventricle has normal function. The left ventricle has no regional  wall motion abnormalities. There is mild left ventricular hypertrophy.  Left ventricular diastolic parameters  are consistent with Grade I diastolic dysfunction (impaired relaxation).   2. Right ventricular systolic function is normal. The right ventricular  size is normal.   3. The mitral valve is normal in structure. No evidence of mitral valve  regurgitation. No evidence of mitral stenosis.   4. The aortic valve is tricuspid. There is mild calcification of the  aortic valve. There is mild thickening of the aortic valve. Aortic valve  regurgitation is not visualized. No aortic stenosis is present.   5. The inferior vena cava is normal in size with greater than 50%  respiratory variability, suggesting right atrial pressure of 3 mmHg.   NST: 01/2021 Findings are consistent with prior myocardial infarction with peri-infarct ischemia. The study is high risk.   1 mm ST segment depression noted in leads II and III in recovery. The ECG was borderline for ischemia.  Frequent PVCs and ventricular bigeminy noted.   LV perfusion is   abnormal. Defect 1: There is a  large defect with severe reduction in uptake present in the apical anterior and anteroseptal location(s) that is fixed. There is abnormal wall motion in the defect area. Defect 2: There is a medium defect with moderate reduction in uptake present in the basal inferolateral location(s) that is fixed. There is abnormal wall motion in the defect area.   Left ventricular function is abnormal. Nuclear stress EF: 44 %. Evidence of transient ischemic dilation (TID) noted.   Overall image quality is poor.   High risk study.  Medium to large perfusion defects noted in the apical anterior/anteroseptal distribution as well as basal inferolateral distribution with wall motion abnormalities suggestive of scar and mild peri-infarct ischemia anteroseptally.  LVEF calculated at 44% and TID ratio also increased suggesting possibility of diffuse subendocardial ischemia.  Assessment:    1. Abnormal nuclear stress test   2. Pre-op testing   3. PVC's (premature ventricular contractions)   4. Essential hypertension   5. Mixed hyperlipidemia      Plan:   In order of problems listed above:  1. Abnormal Stress Test - Recent NST showed medium to large defects in the apical anterior/anterior septal distribution as well as basal inferior lateral distribution which was suggestive of scar and mild peri-infarct ischemia and [name] did recommend a cardiac catheterization for definitive evaluation. The procedure was reviewed in detail with the patient today. She wishes to have this after Thanksgiving and given no recent anginal symptoms, this seems reasonable. Will obtain pre-procedure labs once closer to her procedure date.  - Continue ASA 44m daily and Zetia 12mdaily. Will start beta-blocker therapy as outlined below.   2. PVC's - Recent monitor earlier this year showed a 24% PVC burden and echo showed a preserved EF with no regional WMA. I reviewed with the patient today that her HR has not been in the 40's at prior  visits but pulse oximeters and BP cuffs were likely recording this due to her PVC's at that time. Average HR was in the 80's on her monitor.  - Will plan to add Toprol-XL 12.62m78maily to help with her palpitations and PVC burden which can be titrated if needed.   3. HTN - Her BP is at 142/80 today but she was anxious about her stress test results and the next step. Would continue HCTZ 262m31mily and plan to add Toprol-XL as outlined above.   4. HLD -  Followed by her PCP. LDL was elevated to 140 in 01/2021 and she was started on Zetia 10mg29mly given intolerances to statin therapy in the past. Will need to follow closely, especially if found to have CAD on her upcoming catheterization.     Shared Decision Making/Informed Consent:   The risks [stroke (1 in 1000), death (1 in 1000), kidney failure [usually temporary] (1 in 500), bleeding (1 in 200), allergic reaction [possibly serious] (1 in 200)], benefits (diagnostic support and management of coronary artery disease) and alternatives of a cardiac catheterization were discussed in detail,    Medication Adjustments/Labs and Tests Ordered: Current medicines are reviewed at length with the patient today.  Concerns regarding medicines are outlined above.  Medication changes, Labs and Tests ordered today are listed in the Patient Instructions below. Patient Instructions  Medication Instructions:  START Toprol XL 12.5 mg daily at bedtime   Labwork:  CBC,BMET on 03/04/21   Testing/Procedures:  Your physician has requested that you have a cardiac catheterization. Cardiac catheterization is used  to diagnose and/or treat various heart conditions. Doctors may recommend this procedure for a number of different reasons. The most common reason is to evaluate chest pain. Chest pain can be a symptom of coronary artery disease (CAD), and cardiac catheterization can show whether plaque is narrowing or blocking your heart's arteries. This procedure is  also used to evaluate the valves, as well as measure the blood flow and oxygen levels in different parts of your heart. For further information please visit www.cardiosmart.org. Please follow instruction sheet, as given.    Follow-Up: 2-3 weeks after cath   Any Other Special Instructions Will Be Listed Below (If Applicable).  If you need a refill on your cardiac medications before your next appointment, please call your pharmacy.   Signed, Derrill Bagnell M Sutter Ahlgren, PA-C  02/14/2021 8:10 PM    [facility] [address] [place], [place] [address] Phone: [phone] Fax: [phone]   

## 2021-02-14 NOTE — Progress Notes (Signed)
Cardiology Office Note    Date:  02/14/2021   ID:  Jaz Laningham Bendickson, DOB [birth year], MRN [id number]  PCP:  Martinique, Betty G, MD  Cardiologist: Dorris Carnes, MD    Chief Complaint  Patient presents with   Follow-up    Abnormal Stress Test    History of Present Illness:    Winfred Redel Greif is a 70 y.o. female with past medical history of palpitations, HTN, HLD, Type 2 DM, anxiety and PTSD who presents to the office today for follow-up from her recent abnormal stress test.   She was examined by [name] in 09/2020 as a new patient referral for an irregular pulse and bradycardia she reported her heart rate was in the 40's at times and she did experience occasional dizziness but no syncopal episodes. Prior EKG's had demonstrated frequent PVC's and a 48-hour Holter monitor was recommended for further evaluation. This showed predominantly normal sinus rhythm with an average heart rate of 81 bpm. She did have frequent PVC's representing 24% of total beats and also had episodes of ventricular bigeminy and trigeminy. An echocardiogram was obtained for further evaluation and showed a preserved EF of 55 to 60% with no regional wall motion abnormalities. She did not have any significant valve abnormalities. Given the need for possible upcoming back surgery, a stress test was recommended for further evaluation and showed medium to large defects in the apical anterior/anterior septal distribution as well as basal inferior lateral distribution which was suggestive of scar and mild peri-infarct ischemia. Was overall felt to be a high risk study and [name] recommended a cardiac catheterization for definitive evaluation. She did call the patient and reviewed this along with risks and benefits and she was in agreement to proceed but presents to the office for an updated H&P.   In talking with the patient today, she does report having palpitations which typically are most notable at night and this has been  occurring for a few years per her report. Describes it as her heart skipping. She denies any recent chest pain or dyspnea on exertion. No specific orthopnea, PND or pitting edema.  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[place])    Anxiety    Arthritis    Diabetes mellitus without complication (HCC)    GERD (gastroesophageal reflux disease)    Heart murmur    Hyperlipidemia    Hypertension    Neuromuscular disorder (HCC)    Palpitations    Pneumonia    PONV (postoperative nausea and vomiting)    PTSD (post-traumatic stress disorder)    Ringing of ears, left     Past Surgical History:  Procedure Laterality Date   ABDOMINAL HYSTERECTOMY     partial   ACHILLES TENDON SURGERY Left    APPENDECTOMY     BACK SURGERY     BLADDER SUSPENSION     BREAST SURGERY Right 2003   milk duct removed   CHOLECYSTECTOMY     FRACTURE SURGERY     collar bone   NOSE SURGERY     x 2    OTHER SURGICAL HISTORY     Breast Duct removed   PARTIAL HYSTERECTOMY     RECTOCELE REPAIR     TONSILLECTOMY     VASCULAR SURGERY Right    right leg   VEIN SURGERY Right    leg    Current Medications: Outpatient Medications Prior to Visit  Medication Sig Dispense Refill   ACCU-CHEK SOFTCLIX LANCETS  lancets Use to test blood sugar 3 times daily. Dx: E11.8 100 each 12   albuterol (VENTOLIN HFA) 108 (90 Base) MCG/ACT inhaler INHALE 2 PUFFS INTO THE LUNGS EVERY 6 HOURS AS NEEDED FOR WHEEZING OR SHORTNESS OF BREATH 18 g 2   Alcohol Swabs (B-D SINGLE USE SWABS REGULAR) PADS Use to test blood sugar 3 times daily 100 each 3   Blood Glucose Calibration (ACCU-CHEK AVIVA) SOLN Use as directed 1 each 2   Blood Glucose Monitoring Suppl (ACCU-CHEK AVIVA PLUS) w/Device KIT Use to check blood sugar 3 times daily 1 kit 1   clonazePAM (KLONOPIN) 1 MG tablet Take 1 tablet (1 mg total) by mouth 2 (two) times daily as needed for anxiety. 60 tablet 3   Dulaglutide (TRULICITY) 1.5 RJ/1.8AC SOPN Inject 1.5 mg  into the skin once a week. 13 mL 3   ezetimibe (ZETIA) 10 MG tablet Take 1 tablet (10 mg total) by mouth daily. 30 tablet 3   gabapentin (NEURONTIN) 600 MG tablet Take 1 tablet (600 mg total) by mouth in the morning, at noon, in the evening, and at bedtime. Discontinued 400 mg prescription from her profile. 120 tablet 3   glucose blood (ACCU-CHEK AVIVA) test strip Use to test blood sugar 3 times daily. Dx: 11.8 100 each 12   hydrochlorothiazide (HYDRODIURIL) 25 MG tablet TAKE 1 TABLET BY MOUTH EVERY DAY 90 tablet 3   HYDROcodone-acetaminophen (NORCO) 7.5-325 MG tablet Take 1 tablet by mouth every 6 (six) hours as needed for moderate pain. 120 tablet 0   ipratropium (ATROVENT) 0.06 % nasal spray Place 2 sprays into both nostrils 4 (four) times daily. 15 mL 3   omeprazole (PRILOSEC) 40 MG capsule Take 1 capsule (40 mg total) by mouth daily. 90 capsule 2   potassium chloride SA (KLOR-CON) 20 MEQ tablet Take 1 tablet (20 mEq total) by mouth daily. 30 tablet 3   QUEtiapine (SEROQUEL) 100 MG tablet Take 0.5 tablets (50 mg total) by mouth at bedtime. 45 tablet 3   triamcinolone (KENALOG) 0.1 % paste Use as directed 1 application in the mouth or throat 2 (two) times daily. 5 g 1   No facility-administered medications prior to visit.     Allergies:   Hydromorphone, Morphine and related, Other, and Penicillins   Social History   Socioeconomic History   Marital status: Divorced    Spouse name: Not on file   Number of children: Not on file   Years of education: Not on file   Highest education level: Not on file  Occupational History   Not on file  Tobacco Use   Smoking status: Never   Smokeless tobacco: Never  Vaping Use   Vaping Use: Never used  Substance and Sexual Activity   Alcohol use: No   Drug use: No   Sexual activity: Not on file  Other Topics Concern   Not on file  Social History Narrative   Not on file    Family History:  The patient's family history includes Alzheimer's  disease in her mother; Diabetes in her mother; Esophageal cancer in her brother; Lung cancer in her father.   Review of Systems:    Please see the history of present illness.     All other systems reviewed and are otherwise negative except as noted above.   Physical Exam:    VS:  BP (!) 142/80   Pulse 72   Ht 5' 8" (1.727 m)   Wt 188 lb (85.3 kg)  SpO2 98%   BMI 28.59 kg/m    General: Well developed, well nourished,female appearing in no acute distress. Head: Normocephalic, atraumatic. Neck: No carotid bruits. JVD not elevated.  Lungs: Respirations regular and unlabored, without wheezes or rales.  Heart: Regular rate and rhythm. No S3 or S4.  No murmur, no rubs, or gallops appreciated. Abdomen: Appears non-distended. No obvious abdominal masses. Msk:  Strength and tone appear normal for age. No obvious joint deformities or effusions. Extremities: No clubbing or cyanosis. No pitting edema.  Distal pedal pulses are 2+ bilaterally. Neuro: Alert and oriented X 3. Moves all extremities spontaneously. No focal deficits noted. Psych:  Responds to questions appropriately with a normal affect. Skin: No rashes or lesions noted  Wt Readings from Last 3 Encounters:  02/14/21 188 lb (85.3 kg)  02/11/21 183 lb (83 kg)  12/18/20 181 lb (82.1 kg)      Studies/Labs Reviewed:   EKG:  EKG is ordered today.  The ekg ordered today demonstrates NSR, HR 72 with no acute ST changes when compared to prior tracings.  Recent Labs: 07/02/2020: ALT 19; Hemoglobin 13.5; Platelets 240 01/28/2021: BUN 17; Creatinine, Ser 0.95; Magnesium 1.6; Potassium 4.3; Sodium 139; TSH 0.49   Lipid Panel    Component Value Date/Time   CHOL 261 (H) 01/28/2021 1007   CHOL 183 12/16/2016 1517   TRIG 295.0 (H) 01/28/2021 1007   HDL 43.70 01/28/2021 1007   HDL 53 12/16/2016 1517   CHOLHDL 6 01/28/2021 1007   VLDL 59.0 (H) 01/28/2021 1007   LDLCALC 84 12/16/2016 1517   LDLDIRECT 140.0 01/28/2021 1007     Additional studies/ records that were reviewed today include:   Event Monitor: 09/2020 ZIO XT reviewed.  2 days 9 hours analyzed.  Predominant rhythm is sinus with heart rate ranging from 56 bpm up to 117 bpm and average heart rate 81 bpm.  There were rare PACs including couplets and triplets representing less than 1% total beats.  There were frequent PVCs representing approximately 24% total beats.  Also rare ventricular couplets and triplets representing less than 1% total beats and episodes of ventricular bigeminy and trigeminy.  No sustained ventricular events.  There were 4 episodes of SVT, the longest of which lasted for 13 beats.  No pauses.  Echocardiogram: 09/2020 IMPRESSIONS     1. Left ventricular ejection fraction, by estimation, is 55 to 60%. The  left ventricle has normal function. The left ventricle has no regional  wall motion abnormalities. There is mild left ventricular hypertrophy.  Left ventricular diastolic parameters  are consistent with Grade I diastolic dysfunction (impaired relaxation).   2. Right ventricular systolic function is normal. The right ventricular  size is normal.   3. The mitral valve is normal in structure. No evidence of mitral valve  regurgitation. No evidence of mitral stenosis.   4. The aortic valve is tricuspid. There is mild calcification of the  aortic valve. There is mild thickening of the aortic valve. Aortic valve  regurgitation is not visualized. No aortic stenosis is present.   5. The inferior vena cava is normal in size with greater than 50%  respiratory variability, suggesting right atrial pressure of 3 mmHg.   NST: 01/2021 Findings are consistent with prior myocardial infarction with peri-infarct ischemia. The study is high risk.   1 mm ST segment depression noted in leads II and III in recovery. The ECG was borderline for ischemia.  Frequent PVCs and ventricular bigeminy noted.   LV perfusion is  abnormal. Defect 1: There is a  large defect with severe reduction in uptake present in the apical anterior and anteroseptal location(s) that is fixed. There is abnormal wall motion in the defect area. Defect 2: There is a medium defect with moderate reduction in uptake present in the basal inferolateral location(s) that is fixed. There is abnormal wall motion in the defect area.   Left ventricular function is abnormal. Nuclear stress EF: 44 %. Evidence of transient ischemic dilation (TID) noted.   Overall image quality is poor.   High risk study.  Medium to large perfusion defects noted in the apical anterior/anteroseptal distribution as well as basal inferolateral distribution with wall motion abnormalities suggestive of scar and mild peri-infarct ischemia anteroseptally.  LVEF calculated at 44% and TID ratio also increased suggesting possibility of diffuse subendocardial ischemia.  Assessment:    1. Abnormal nuclear stress test   2. Pre-op testing   3. PVC's (premature ventricular contractions)   4. Essential hypertension   5. Mixed hyperlipidemia      Plan:   In order of problems listed above:  1. Abnormal Stress Test - Recent NST showed medium to large defects in the apical anterior/anterior septal distribution as well as basal inferior lateral distribution which was suggestive of scar and mild peri-infarct ischemia and [name] did recommend a cardiac catheterization for definitive evaluation. The procedure was reviewed in detail with the patient today. She wishes to have this after Thanksgiving and given no recent anginal symptoms, this seems reasonable. Will obtain pre-procedure labs once closer to her procedure date.  - Continue ASA 57m daily and Zetia 166mdaily. Will start beta-blocker therapy as outlined below.   2. PVC's - Recent monitor earlier this year showed a 24% PVC burden and echo showed a preserved EF with no regional WMA. I reviewed with the patient today that her HR has not been in the 40's at prior  visits but pulse oximeters and BP cuffs were likely recording this due to her PVC's at that time. Average HR was in the 80's on her monitor.  - Will plan to add Toprol-XL 12.86m56maily to help with her palpitations and PVC burden which can be titrated if needed.   3. HTN - Her BP is at 142/80 today but she was anxious about her stress test results and the next step. Would continue HCTZ 286m12mily and plan to add Toprol-XL as outlined above.   4. HLD -  Followed by her PCP. LDL was elevated to 140 in 01/2021 and she was started on Zetia 10mg48mly given intolerances to statin therapy in the past. Will need to follow closely, especially if found to have CAD on her upcoming catheterization.     Shared Decision Making/Informed Consent:   The risks [stroke (1 in 1000), death (1 in 1000), kidney failure [usually temporary] (1 in 500), bleeding (1 in 200), allergic reaction [possibly serious] (1 in 200)], benefits (diagnostic support and management of coronary artery disease) and alternatives of a cardiac catheterization were discussed in detail,    Medication Adjustments/Labs and Tests Ordered: Current medicines are reviewed at length with the patient today.  Concerns regarding medicines are outlined above.  Medication changes, Labs and Tests ordered today are listed in the Patient Instructions below. Patient Instructions  Medication Instructions:  START Toprol XL 12.5 mg daily at bedtime   Labwork:  CBC,BMET on 03/04/21   Testing/Procedures:  Your physician has requested that you have a cardiac catheterization. Cardiac catheterization is used  to diagnose and/or treat various heart conditions. Doctors may recommend this procedure for a number of different reasons. The most common reason is to evaluate chest pain. Chest pain can be a symptom of coronary artery disease (CAD), and cardiac catheterization can show whether plaque is narrowing or blocking your heart's arteries. This procedure is  also used to evaluate the valves, as well as measure the blood flow and oxygen levels in different parts of your heart. For further information please visit [facility]uge[facility]iest[facility]tn. Please follow instruction sheet, as given.    Follow-Up: 2-3 weeks after cath   Any Other Special Instructions Will Be Listed Below (If Applicable).  If you need a refill on your cardiac medications before your next appointment, please call your pharmacy.   Signed, Erma Heritage, PA-C  02/14/2021 8:10 PM    [facility] [address] [place], [place] [address] Phone: [phone] Fax: [phone]

## 2021-02-14 NOTE — Patient Instructions (Signed)
Medication Instructions:  START Toprol XL 12.5 mg daily at bedtime   Labwork:  CBC,BMET on 03/04/21   Testing/Procedures:  Your physician has requested that you have a cardiac catheterization. Cardiac catheterization is used to diagnose and/or treat various heart conditions. Doctors may recommend this procedure for a number of different reasons. The most common reason is to evaluate chest pain. Chest pain can be a symptom of coronary artery disease (CAD), and cardiac catheterization can show whether plaque is narrowing or blocking your heart's arteries. This procedure is also used to evaluate the valves, as well as measure the blood flow and oxygen levels in different parts of your heart. For further information please visit [facility]uge[facility]iest[facility]tn. Please follow instruction sheet, as given.    Follow-Up: 2-3 weeks after cath   Any Other Special Instructions Will Be Listed Below (If Applicable).  If you need a refill on your cardiac medications before your next appointment, please call your pharmacy.

## 2021-02-17 ENCOUNTER — Ambulatory Visit
Admission: EM | Admit: 2021-02-17 | Discharge: 2021-02-17 | Discharge status: Against Medical Advice | Payer: Medicare Other

## 2021-02-17 ENCOUNTER — Other Ambulatory Visit: Payer: Self-pay

## 2021-02-17 NOTE — ED Triage Notes (Signed)
Pt presents today reporting that she had one episode of wheezing that she heard in chest this morning. She then used her inhaler and wheezing went away. Denies wheezing at this time. Denies SOB or chest pain.

## 2021-02-17 NOTE — ED Notes (Addendum)
Assessed patient in intake due to c/o wheezing, intake completed, VS Stable, no current s/s. Pt voiced she didn't wish to stay to be seen by provider and stated "well I'm going to leave and call my doctor tomorrow morning". Provider notified of same.

## 2021-02-22 ENCOUNTER — Telehealth: Payer: Self-pay

## 2021-02-22 NOTE — Telephone Encounter (Signed)
Pt does not want to travel to [facility] [place] and will go to [facility] in [place] today. Pt lives in [place]

## 2021-02-22 NOTE — Telephone Encounter (Signed)
Caller states she is wheezing and would like an appt. ---She is wheezing and congestion in her upper chest. She is using her inhaler. no fever, no cough and no SOB. She is working S+s for the last 4 days. The wheezing is worse early in the morning  02/22/2021 8:58:34 AM Clinical Call Hardin Negus, RN, Mardene Celeste  02/22/21 1040: LVM instructions for pt to return call to schedule an appt with any provider in any office. Warm transfer if needed.

## 2021-02-27 ENCOUNTER — Encounter: Payer: Self-pay | Admitting: Family Medicine

## 2021-02-27 DIAGNOSIS — F32A Depression, unspecified: Secondary | ICD-10-CM

## 2021-02-27 MED ORDER — CLONAZEPAM 1 MG PO TABS: 1.0000 mg | ORAL_TABLET | Freq: Two times a day (BID) | ORAL | 3 refills | Status: DC | PRN

## 2021-03-04 ENCOUNTER — Other Ambulatory Visit (HOSPITAL_COMMUNITY)
Admission: RE | Admit: 2021-03-04 | Discharge: 2021-03-04 | Disposition: A | Discharge status: Home / Self Care | Payer: Medicare Other | Source: Ambulatory Visit | Attending: Student | Admitting: Student

## 2021-03-04 DIAGNOSIS — Z01818 Encounter for other preprocedural examination: Secondary | ICD-10-CM | POA: Insufficient documentation

## 2021-03-04 DIAGNOSIS — R9439 Abnormal result of other cardiovascular function study: Secondary | ICD-10-CM | POA: Insufficient documentation

## 2021-03-04 LAB — CBC
HCT: 38.8 % (ref 36.0–46.0)
Hemoglobin: 13.5 g/dL (ref 12.0–15.0)
MCH: 32.8 pg (ref 26.0–34.0)
MCHC: 34.8 g/dL (ref 30.0–36.0)
MCV: 94.2 fL (ref 80.0–100.0)
Platelets: 210 10*3/uL (ref 150–400)
RBC: 4.12 MIL/uL (ref 3.87–5.11)
RDW: 11.7 % (ref 11.5–15.5)
WBC: 3.7 10*3/uL — ABNORMAL LOW (ref 4.0–10.5)
nRBC: 0 % (ref 0.0–0.2)

## 2021-03-04 LAB — BASIC METABOLIC PANEL
Anion gap: 11 (ref 5–15)
BUN: 15 mg/dL (ref 8–23)
CO2: 23 mmol/L (ref 22–32)
Calcium: 9.4 mg/dL (ref 8.9–10.3)
Chloride: 104 mmol/L (ref 98–111)
Creatinine, Ser: 0.87 mg/dL (ref 0.44–1.00)
GFR, Estimated: >60 mL/min (ref >60)
Glucose, Bld: 146 mg/dL — ABNORMAL HIGH (ref 70–99)
Potassium: 3.7 mmol/L (ref 3.5–5.1)
Sodium: 138 mmol/L (ref 135–145)

## 2021-03-05 DIAGNOSIS — Z6828 Body mass index (BMI) 28.0-28.9, adult: Secondary | ICD-10-CM | POA: Diagnosis not present

## 2021-03-05 DIAGNOSIS — M5416 Radiculopathy, lumbar region: Secondary | ICD-10-CM | POA: Diagnosis not present

## 2021-03-05 DIAGNOSIS — G629 Polyneuropathy, unspecified: Secondary | ICD-10-CM | POA: Diagnosis not present

## 2021-03-05 DIAGNOSIS — M48061 Spinal stenosis, lumbar region without neurogenic claudication: Secondary | ICD-10-CM | POA: Diagnosis not present

## 2021-03-06 ENCOUNTER — Other Ambulatory Visit: Payer: Self-pay | Admitting: Neurosurgery

## 2021-03-06 DIAGNOSIS — M5416 Radiculopathy, lumbar region: Secondary | ICD-10-CM

## 2021-03-07 ENCOUNTER — Telehealth: Payer: Self-pay | Admitting: *Deleted

## 2021-03-07 NOTE — Telephone Encounter (Addendum)
Cardiac catheterization scheduled at [facility] for: Friday March 08, 2021 [facility] Entr[facility]) at: 7 AM   No solid food after midnight prior to cath, clear liquids until 5 AM day of procedure.  Medication instructions: Hold: HCTZ/KCl-AM of procedure Trulicity weekly on Fridays-patient will take day before or day after procedure.  Except hold medications usual morning medications can be taken pre-cath with sips of water including aspirin 81 mg.    Confirmed patient has responsible adult to drive home post procedure and be with patient first 24 hours after arriving home.  [facility] does allow one visitor to accompany you and wait in the hospital waiting room while you are there for your procedure. You and your visitor will be asked to wear a mask once you enter the hospital.   Patient reports does not currently have any new symptoms concerning for COVID-19 and no household members with COVID-19 like illness.    Reviewed procedure/mask/visitor instructions with patient.

## 2021-03-08 ENCOUNTER — Ambulatory Visit (HOSPITAL_BASED_OUTPATIENT_CLINIC_OR_DEPARTMENT_OTHER): Payer: Medicare Other

## 2021-03-08 ENCOUNTER — Encounter (HOSPITAL_COMMUNITY)
Admission: RE | Disposition: A | Discharge status: Home / Self Care | Payer: Self-pay | Source: Home / Self Care | Attending: Cardiology

## 2021-03-08 ENCOUNTER — Encounter (HOSPITAL_COMMUNITY): Payer: Self-pay | Admitting: Cardiology

## 2021-03-08 ENCOUNTER — Ambulatory Visit (HOSPITAL_COMMUNITY)
Admission: RE | Admit: 2021-03-08 | Discharge: 2021-03-08 | Disposition: A | Discharge status: Home / Self Care | Payer: Medicare Other | Attending: Cardiology | Admitting: Cardiology

## 2021-03-08 ENCOUNTER — Other Ambulatory Visit: Payer: Self-pay

## 2021-03-08 DIAGNOSIS — E782 Mixed hyperlipidemia: Secondary | ICD-10-CM | POA: Diagnosis not present

## 2021-03-08 DIAGNOSIS — F419 Anxiety disorder, unspecified: Secondary | ICD-10-CM | POA: Diagnosis not present

## 2021-03-08 DIAGNOSIS — I2510 Atherosclerotic heart disease of native coronary artery without angina pectoris: Secondary | ICD-10-CM | POA: Insufficient documentation

## 2021-03-08 DIAGNOSIS — Z7982 Long term (current) use of aspirin: Secondary | ICD-10-CM | POA: Diagnosis not present

## 2021-03-08 DIAGNOSIS — E7800 Pure hypercholesterolemia, unspecified: Secondary | ICD-10-CM | POA: Diagnosis present

## 2021-03-08 DIAGNOSIS — I10 Essential (primary) hypertension: Secondary | ICD-10-CM | POA: Diagnosis not present

## 2021-03-08 DIAGNOSIS — R931 Abnormal findings on diagnostic imaging of heart and coronary circulation: Secondary | ICD-10-CM | POA: Diagnosis present

## 2021-03-08 DIAGNOSIS — R9439 Abnormal result of other cardiovascular function study: Secondary | ICD-10-CM | POA: Diagnosis present

## 2021-03-08 DIAGNOSIS — Z79899 Other long term (current) drug therapy: Secondary | ICD-10-CM | POA: Insufficient documentation

## 2021-03-08 DIAGNOSIS — E119 Type 2 diabetes mellitus without complications: Secondary | ICD-10-CM | POA: Insufficient documentation

## 2021-03-08 DIAGNOSIS — E1149 Type 2 diabetes mellitus with other diabetic neurological complication: Secondary | ICD-10-CM | POA: Diagnosis present

## 2021-03-08 HISTORY — PX: LEFT HEART CATH AND CORONARY ANGIOGRAPHY: CATH118249

## 2021-03-08 LAB — ECHOCARDIOGRAM COMPLETE
Area-P 1/2: 3.58 cm2
Height: 68 in
S' Lateral: 3.5 cm
Weight: 2912 oz

## 2021-03-08 LAB — GLUCOSE, CAPILLARY: Glucose-Capillary: 123 mg/dL — ABNORMAL HIGH (ref 70–99)

## 2021-03-08 SURGERY — LEFT HEART CATH AND CORONARY ANGIOGRAPHY
Anesthesia: LOCAL

## 2021-03-08 MED ORDER — SODIUM CHLORIDE 0.9 % IV SOLN
250.0000 mL | INTRAVENOUS | Status: DC | PRN
Start: 2021-03-08 — End: 2021-03-08

## 2021-03-08 MED ORDER — LIDOCAINE HCL (PF) 1 % IJ SOLN
INTRAMUSCULAR | Status: DC | PRN
  Administered 2021-03-08: 2 mL

## 2021-03-08 MED ORDER — FENTANYL CITRATE (PF) 100 MCG/2ML IJ SOLN
INTRAMUSCULAR | Status: DC | PRN
  Administered 2021-03-08: 50 ug via INTRAVENOUS
  Administered 2021-03-08: 25 ug via INTRAVENOUS

## 2021-03-08 MED ORDER — DIAZEPAM 5 MG PO TABS
5.0000 mg | ORAL_TABLET | Freq: Once | ORAL | Status: AC
  Administered 2021-03-08: 5 mg via ORAL

## 2021-03-08 MED ORDER — VERAPAMIL HCL 2.5 MG/ML IV SOLN
INTRAVENOUS | Status: DC | PRN
  Administered 2021-03-08: 10 mL via INTRA_ARTERIAL

## 2021-03-08 MED ORDER — MIDAZOLAM HCL 2 MG/2ML IJ SOLN
INTRAMUSCULAR | Status: AC
  Filled 2021-03-08: qty 2

## 2021-03-08 MED ORDER — SODIUM CHLORIDE 0.9% FLUSH: 3.0000 mL | INTRAVENOUS | Status: DC | PRN

## 2021-03-08 MED ORDER — SODIUM CHLORIDE 0.9 % WEIGHT BASED INFUSION: 1.0000 mL/kg/h | INTRAVENOUS | Status: AC

## 2021-03-08 MED ORDER — IOHEXOL 350 MG/ML SOLN
INTRAVENOUS | Status: DC | PRN
  Administered 2021-03-08: 45 mL

## 2021-03-08 MED ORDER — ONDANSETRON HCL 4 MG/2ML IJ SOLN: 4.0000 mg | Freq: Four times a day (QID) | INTRAMUSCULAR | Status: DC | PRN

## 2021-03-08 MED ORDER — SODIUM CHLORIDE 0.9 % WEIGHT BASED INFUSION: 1.0000 mL/kg/h | INTRAVENOUS | Status: DC

## 2021-03-08 MED ORDER — ASPIRIN 81 MG PO CHEW
81.0000 mg | CHEWABLE_TABLET | ORAL | Status: DC
Start: 2021-03-08 — End: 2021-03-08

## 2021-03-08 MED ORDER — HEPARIN (PORCINE) IN NACL 1000-0.9 UT/500ML-% IV SOLN
INTRAVENOUS | Status: AC
  Filled 2021-03-08: qty 1000

## 2021-03-08 MED ORDER — ACETAMINOPHEN 325 MG PO TABS: 650.0000 mg | ORAL_TABLET | ORAL | Status: DC | PRN

## 2021-03-08 MED ORDER — MIDAZOLAM HCL 2 MG/2ML IJ SOLN
INTRAMUSCULAR | Status: DC | PRN
  Administered 2021-03-08: 2 mg via INTRAVENOUS
  Administered 2021-03-08: 1 mg via INTRAVENOUS

## 2021-03-08 MED ORDER — VERAPAMIL HCL 2.5 MG/ML IV SOLN
INTRAVENOUS | Status: AC
  Filled 2021-03-08: qty 2

## 2021-03-08 MED ORDER — LIDOCAINE HCL (PF) 1 % IJ SOLN
INTRAMUSCULAR | Status: AC
  Filled 2021-03-08: qty 30

## 2021-03-08 MED ORDER — PERFLUTREN LIPID MICROSPHERE
1.0000 mL | INTRAVENOUS | Status: DC | PRN
Start: 2021-03-08 — End: 2021-03-08
  Administered 2021-03-08: 2 mL via INTRAVENOUS
  Filled 2021-03-08: qty 10

## 2021-03-08 MED ORDER — SODIUM CHLORIDE 0.9 % WEIGHT BASED INFUSION
3.0000 mL/kg/h | INTRAVENOUS | Status: AC
  Administered 2021-03-08: 3 mL/kg/h via INTRAVENOUS

## 2021-03-08 MED ORDER — DIAZEPAM 5 MG PO TABS
ORAL_TABLET | ORAL | Status: AC
  Filled 2021-03-08: qty 1

## 2021-03-08 MED ORDER — SODIUM CHLORIDE 0.9 % IV SOLN: 250.0000 mL | INTRAVENOUS | Status: DC | PRN

## 2021-03-08 MED ORDER — HEPARIN (PORCINE) IN NACL 1000-0.9 UT/500ML-% IV SOLN
INTRAVENOUS | Status: DC | PRN
  Administered 2021-03-08 (×2): 500 mL

## 2021-03-08 MED ORDER — SODIUM CHLORIDE 0.9% FLUSH: 3.0000 mL | Freq: Two times a day (BID) | INTRAVENOUS | Status: DC

## 2021-03-08 MED ORDER — HEPARIN SODIUM (PORCINE) 1000 UNIT/ML IJ SOLN
INTRAMUSCULAR | Status: DC | PRN
  Administered 2021-03-08: 4000 [IU] via INTRAVENOUS

## 2021-03-08 MED ORDER — HEPARIN SODIUM (PORCINE) 1000 UNIT/ML IJ SOLN
INTRAMUSCULAR | Status: AC
  Filled 2021-03-08: qty 10

## 2021-03-08 MED ORDER — HYDRALAZINE HCL 20 MG/ML IJ SOLN: 10.0000 mg | INTRAMUSCULAR | Status: DC | PRN

## 2021-03-08 MED ORDER — FENTANYL CITRATE (PF) 100 MCG/2ML IJ SOLN
INTRAMUSCULAR | Status: AC
  Filled 2021-03-08: qty 2

## 2021-03-08 SURGICAL SUPPLY — 10 items
CATH INFINITI 5 FR JL3.5 (CATHETERS) ×1 IMPLANT
CATH INFINITI JR4 5F (CATHETERS) ×1 IMPLANT
DEVICE RAD COMP TR BAND LRG (VASCULAR PRODUCTS) ×1 IMPLANT
GLIDESHEATH SLEND SS 6F .021 (SHEATH) ×1 IMPLANT
GUIDEWIRE INQWIRE 1.5J.035X260 (WIRE) IMPLANT
INQWIRE 1.5J .035X260CM (WIRE) ×2
KIT HEART LEFT (KITS) ×2 IMPLANT
PACK CARDIAC CATHETERIZATION (CUSTOM PROCEDURE TRAY) ×2 IMPLANT
TRANSDUCER W/STOPCOCK (MISCELLANEOUS) ×2 IMPLANT
TUBING CIL FLEX 10 FLL-RA (TUBING) ×2 IMPLANT

## 2021-03-08 NOTE — Progress Notes (Signed)
Echocardiogram 2D Echocardiogram has been performed.  Oneal Deputy Moneka Mcquinn RDCS 03/08/2021, 1:07 PM

## 2021-03-08 NOTE — Interval H&P Note (Signed)
History and Physical Interval Note:  03/08/2021 8:56 AM  [name]  has presented today for surgery, with the diagnosis of abnormal nuclear stress test.  The various methods of treatment have been discussed with the patient and family. After consideration of risks, benefits and other options for treatment, the patient has consented to  Procedure(s): LEFT HEART CATH AND CORONARY ANGIOGRAPHY (N/A) as a surgical intervention.  The patient's history has been reviewed, patient examined, no change in status, stable for surgery.  I have reviewed the patient's chart and labs.  Questions were answered to the patient's satisfaction.   Cath Lab Visit (complete for each Cath Lab visit)  Clinical Evaluation Leading to the Procedure:   ACS: No.  Non-ACS:    Anginal Classification: No Symptoms  Anti-ischemic medical therapy: No Therapy  Non-Invasive Test Results: Intermediate-risk stress test findings: cardiac mortality 1-3%/year  Prior CABG: No previous CABG        [facility] 03/08/2021 8:56 AM

## 2021-03-08 NOTE — Progress Notes (Signed)
T[facility] consulted for outpatient CABG evaluation. [facility] office will call patient with an appointment.

## 2021-03-12 DIAGNOSIS — L570 Actinic keratosis: Secondary | ICD-10-CM | POA: Diagnosis not present

## 2021-03-12 DIAGNOSIS — D485 Neoplasm of uncertain behavior of skin: Secondary | ICD-10-CM | POA: Diagnosis not present

## 2021-03-14 ENCOUNTER — Telehealth: Payer: Self-pay | Admitting: Internal Medicine

## 2021-03-14 NOTE — Telephone Encounter (Signed)
Patient is returning call to discuss echo results. She is extremely anxious and irritated. Please return call when able.

## 2021-03-14 NOTE — Telephone Encounter (Signed)
Returned call to patient # listed is a non working #.

## 2021-03-14 NOTE — Telephone Encounter (Signed)
Received a call back from patient.Echo results given.Patient upset.She wanted second opinion on recent cardiac cath.Patient reassured.Advised to keep appointment already scheduled with Dr.Harrell LIghtfoot 12/16 at 1:20 pm.

## 2021-03-15 ENCOUNTER — Other Ambulatory Visit (HOSPITAL_COMMUNITY): Payer: Self-pay | Admitting: Neurosurgery

## 2021-03-15 DIAGNOSIS — M5416 Radiculopathy, lumbar region: Secondary | ICD-10-CM

## 2021-03-18 ENCOUNTER — Ambulatory Visit (HOSPITAL_COMMUNITY): Payer: Medicare Other

## 2021-03-18 ENCOUNTER — Encounter (HOSPITAL_COMMUNITY): Payer: Self-pay

## 2021-03-21 NOTE — Progress Notes (Signed)
[address]       [place] [address]             [phone]        Payal Jean Ghattas [place] Medical Record #[id number] Date of Birth: 1950/04/25  Referring: Martinique, Betty G, MD Primary Care: Martinique, Betty G, MD Primary Cardiologist:Paula [name], MD  Chief Complaint:    Chief Complaint  Patient presents with   Coronary Artery Disease    Surgical consult, Cardiac Cath and ECHO 03/08/2021    History of Present Illness:     70 year old female presents for surgical evaluation of two-vessel coronary artery disease.  This originally was identified when she was being worked up for back surgery.  She was noted to have some bradycardia.  Subsequent work-up ultimately led to a left heart cath which identified the disease.  She denies any chest pain or shortness of breath.  Most of her mobility is limited by her chronic back pain for which she obtained care from the [facility] pain management clinic.    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[place])    Anxiety    Arthritis    Diabetes mellitus without complication (HCC)    GERD (gastroesophageal reflux disease)    Heart murmur    Hyperlipidemia    Hypertension    Neuromuscular disorder (HCC)    Palpitations    Pneumonia    PONV (postoperative nausea and vomiting)    PTSD (post-traumatic stress disorder)    Ringing of ears, left     Past Surgical History:  Procedure Laterality Date   ABDOMINAL HYSTERECTOMY     partial   ACHILLES TENDON SURGERY Left    APPENDECTOMY     BACK SURGERY     BLADDER SUSPENSION     BREAST SURGERY Right 2003   milk duct removed   CHOLECYSTECTOMY     FRACTURE SURGERY     collar bone   LEFT HEART CATH AND CORONARY ANGIOGRAPHY N/A 03/08/2021   Procedure: LEFT HEART CATH AND CORONARY ANGIOGRAPHY;  Surgeon: Martinique, Peter M, MD;  Location: [facility];  Service: Cardiovascular;  Laterality: N/A;   NOSE SURGERY     x 2    OTHER SURGICAL HISTORY      Breast Duct removed   PARTIAL HYSTERECTOMY     RECTOCELE REPAIR     TONSILLECTOMY     VASCULAR SURGERY Right    right leg   VEIN SURGERY Right    leg    Social History: Support: Has lost multiple family members.  She presents with a "sister"  Social History   Tobacco Use  Smoking Status Never  Smokeless Tobacco Never    Social History   Substance and Sexual Activity  Alcohol Use No     Allergies  Allergen Reactions   Hydromorphone Nausea And Vomiting and Other (See Comments)    Made the patient feel "spaced out,"     Morphine And Related Itching    With high doses   Other Itching   Penicillins Rash    Has patient had a PCN reaction causing immediate rash, facial/tongue/throat swelling, SOB or lightheadedness with hypotension: Yes Has patient had a PCN reaction causing severe rash involving mucus membranes or skin necrosis: No Has patient had a PCN reaction that required hospitalization: No Has patient had a PCN reaction occurring within the last 10 years: No If all of the above answers  are "NO", then may proceed with Cephalosporin use.       Current Outpatient Medications  Medication Sig Dispense Refill   ACCU-CHEK SOFTCLIX LANCETS lancets Use to test blood sugar 3 times daily. Dx: E11.8 100 each 12   albuterol (VENTOLIN HFA) 108 (90 Base) MCG/ACT inhaler INHALE 2 PUFFS INTO THE LUNGS EVERY 6 HOURS AS NEEDED FOR WHEEZING OR SHORTNESS OF BREATH 18 g 2   Alcohol Swabs (B-D SINGLE USE SWABS REGULAR) PADS Use to test blood sugar 3 times daily 100 each 3   aspirin EC 81 MG tablet Take 81 mg by mouth daily. Swallow whole.     Blood Glucose Calibration (ACCU-CHEK AVIVA) SOLN Use as directed 1 each 2   Blood Glucose Monitoring Suppl (ACCU-CHEK AVIVA PLUS) w/Device KIT Use to check blood sugar 3 times daily 1 kit 1   clonazePAM (KLONOPIN) 1 MG tablet Take 1 tablet (1 mg total) by mouth 2 (two) times daily as needed for anxiety. 60 tablet 3   Dulaglutide (TRULICITY) 1.5  ZO/1.0RU SOPN Inject 1.5 mg into the skin once a week. 13 mL 3   ezetimibe (ZETIA) 10 MG tablet Take 1 tablet (10 mg total) by mouth daily. 30 tablet 3   gabapentin (NEURONTIN) 600 MG tablet Take 1 tablet (600 mg total) by mouth in the morning, at noon, in the evening, and at bedtime. Discontinued 400 mg prescription from her profile. (Patient taking differently: Take 600 mg by mouth in the morning, at noon, in the evening, and at bedtime.) 120 tablet 3   glucose blood (ACCU-CHEK AVIVA) test strip Use to test blood sugar 3 times daily. Dx: 11.8 100 each 12   hydrochlorothiazide (HYDRODIURIL) 25 MG tablet TAKE 1 TABLET BY MOUTH EVERY DAY 90 tablet 3   HYDROcodone-acetaminophen (NORCO) 7.5-325 MG tablet Take 1 tablet by mouth every 6 (six) hours as needed for moderate pain. 120 tablet 0   ipratropium (ATROVENT) 0.06 % nasal spray Place 2 sprays into both nostrils 4 (four) times daily. 15 mL 3   Magnesium 250 MG TABS Take 250 mg by mouth daily.     omeprazole (PRILOSEC) 40 MG capsule Take 1 capsule (40 mg total) by mouth daily. 90 capsule 2   ondansetron (ZOFRAN-ODT) 4 MG disintegrating tablet Take 4 mg by mouth daily as needed for nausea/vomiting.     potassium chloride SA (KLOR-CON) 20 MEQ tablet Take 1 tablet (20 mEq total) by mouth daily. 30 tablet 3   QUEtiapine (SEROQUEL) 100 MG tablet Take 0.5 tablets (50 mg total) by mouth at bedtime. 45 tablet 3   triamcinolone (KENALOG) 0.1 % paste Use as directed 1 application in the mouth or throat 2 (two) times daily. (Patient taking differently: Use as directed 1 application in the mouth or throat daily as needed (gums).) 5 g 1   metoprolol succinate (TOPROL XL) 25 MG 24 hr tablet Take 0.5 tablets (12.5 mg total) by mouth at bedtime. (Patient not taking: Reported on 03/05/2021) 90 tablet 3   No current facility-administered medications for this visit.    (Not in a hospital admission)   Family History  Problem Relation Age of Onset   Diabetes Mother     Alzheimer's disease Mother    Lung cancer Father    Esophageal cancer Brother    Colon cancer Neg Hx      Review of Systems:   Review of Systems  Constitutional:  Negative for malaise/fatigue.  Respiratory:  Negative for cough and shortness of breath.  Cardiovascular:  Negative for chest pain and leg swelling.  Musculoskeletal:  Positive for back pain, joint pain and myalgias.  Neurological:  Positive for tingling and focal weakness.  Psychiatric/Behavioral:  Positive for depression. The patient is nervous/anxious and has insomnia.      Physical Exam: BP (!) 146/84    Pulse (!) 45    Resp 20    Ht 5' 8" (1.727 m)    Wt 182 lb (82.6 kg)    SpO2 96% Comment: RA   BMI 27.67 kg/m  Physical Exam Constitutional:      General: She is not in acute distress.    Appearance: She is not ill-appearing.  Eyes:     Extraocular Movements: Extraocular movements intact.  Cardiovascular:     Rate and Rhythm: Bradycardia present.     Heart sounds: No murmur heard. Pulmonary:     Effort: Pulmonary effort is normal. No respiratory distress.     Breath sounds: Normal breath sounds.  Abdominal:     General: Abdomen is flat. There is no distension.  Musculoskeletal:     Cervical back: Normal range of motion and neck supple.     Comments: Walks very slowly with a limp  Neurological:     General: No focal deficit present.     Mental Status: She is alert and oriented to person, place, and time.  Psychiatric:     Comments: Extremely tearful throughout the entire encounter.      Diagnostic Studies & Laboratory data:    Left Heart Catherization: Intervention  Echo: IMPRESSIONS     1. Left ventricular ejection fraction, by estimation, is 45 to 50%. The  left ventricle has mildly decreased function. The left ventricle  demonstrates regional wall motion abnormalities (see scoring  diagram/findings for description). There is mild left  ventricular hypertrophy. Left ventricular diastolic  parameters are  indeterminate.   2. Right ventricular systolic function is normal. The right ventricular  size is normal. There is normal pulmonary artery systolic pressure. The  estimated right ventricular systolic pressure is 90.3 mmHg.   3. Left atrial size was mildly dilated.   4. The mitral valve is normal in structure. Trivial mitral valve  regurgitation.   5. The aortic valve is tricuspid. Aortic valve regurgitation is not  visualized. Aortic valve sclerosis/calcification is present, without any  evidence of aortic stenosis.   6. The inferior vena cava is dilated in size with >50% respiratory  variability, suggesting right atrial pressure of 8 mmHg. EKG: Sinus I have independently reviewed the above radiologic studies and discussed with the patient   Recent Lab Findings: Lab Results  Component Value Date   WBC 3.7 (L) 03/04/2021   HGB 13.5 03/04/2021   HCT 38.8 03/04/2021   PLT 210 03/04/2021   GLUCOSE 146 (H) 03/04/2021   CHOL 261 (H) 01/28/2021   TRIG 295.0 (H) 01/28/2021   HDL 43.70 01/28/2021   LDLDIRECT 140.0 01/28/2021   LDLCALC 84 12/16/2016   ALT 19 07/02/2020   AST 21 07/02/2020   NA 138 03/04/2021   K 3.7 03/04/2021   CL 104 03/04/2021   CREATININE 0.87 03/04/2021   BUN 15 03/04/2021   CO2 23 03/04/2021   TSH 0.49 01/28/2021   INR 1.03 10/20/2015   HGBA1C 6.2 12/18/2020      Assessment / Plan:   70 year old female with two-vessel coronary artery disease.  She has a left dominant system.  I personally reviewed her left heart catheterization.  Her LAD is small  but she will require bypass to this.  Her circumflex system has a stenosis at the branches of a trifurcating system.  3 which will require bypasses.  CABG x4.  She was extremely tearful and anxious throughout the entire interview.  She is very hesitant to proceed with any type of surgical intervention.  She states that if she will require nursing home care after the operation that she is not  willing to proceed with it.  Her friend did state that she would be willing to assist with home care.  I think part of the issue is also mobility due to her chronic back pain and left lower extremity weakness.  I explained to her that physical therapy will ultimately make a decision in regards to her disposition.  She will touch base with [place] to let her know what her ultimate decision is. (BSC)     I  spent 80 minutes counseling the patient face to face.   Lajuana Matte 03/25/2021 1:31 PM

## 2021-03-22 ENCOUNTER — Other Ambulatory Visit: Payer: Self-pay

## 2021-03-22 ENCOUNTER — Institutional Professional Consult (permissible substitution): Payer: Medicare Other | Admitting: Thoracic Surgery (Cardiothoracic Vascular Surgery)

## 2021-03-22 ENCOUNTER — Encounter: Payer: Medicare Other | Admitting: Thoracic Surgery (Cardiothoracic Vascular Surgery)

## 2021-03-22 VITALS — BP 146/84 | HR 45 | Resp 20 | Ht 68.0 in | Wt 182.0 lb

## 2021-03-22 DIAGNOSIS — I2510 Atherosclerotic heart disease of native coronary artery without angina pectoris: Secondary | ICD-10-CM

## 2021-03-26 ENCOUNTER — Ambulatory Visit (HOSPITAL_COMMUNITY)
Admission: RE | Admit: 2021-03-26 | Discharge: 2021-03-26 | Disposition: A | Discharge status: Home / Self Care | Payer: Medicare Other | Source: Ambulatory Visit | Attending: Neurosurgery | Admitting: Neurosurgery

## 2021-03-26 ENCOUNTER — Other Ambulatory Visit: Payer: Self-pay

## 2021-03-26 DIAGNOSIS — M5416 Radiculopathy, lumbar region: Secondary | ICD-10-CM | POA: Diagnosis not present

## 2021-03-26 DIAGNOSIS — M545 Low back pain, unspecified: Secondary | ICD-10-CM | POA: Diagnosis not present

## 2021-03-26 MED ORDER — GADOBUTROL 1 MMOL/ML IV SOLN
8.0000 mL | Freq: Once | INTRAVENOUS | Status: AC | PRN
  Administered 2021-03-26: 16:00:00 8 mL via INTRAVENOUS

## 2021-04-05 ENCOUNTER — Encounter: Payer: Self-pay | Admitting: Family Medicine

## 2021-04-09 ENCOUNTER — Other Ambulatory Visit: Payer: Self-pay

## 2021-04-09 DIAGNOSIS — M48061 Spinal stenosis, lumbar region without neurogenic claudication: Secondary | ICD-10-CM | POA: Diagnosis not present

## 2021-04-09 MED ORDER — TRULICITY 1.5 MG/0.5ML ~~LOC~~ SOAJ: 1.5000 mg | SUBCUTANEOUS | 3 refills | Status: DC

## 2021-04-10 ENCOUNTER — Encounter: Payer: Self-pay | Admitting: Student

## 2021-04-10 ENCOUNTER — Other Ambulatory Visit: Payer: Self-pay

## 2021-04-10 ENCOUNTER — Ambulatory Visit (INDEPENDENT_AMBULATORY_CARE_PROVIDER_SITE_OTHER): Payer: Medicare Other | Admitting: Student

## 2021-04-10 VITALS — BP 110/78 | HR 64 | Ht 68.0 in | Wt 186.0 lb

## 2021-04-10 DIAGNOSIS — I2510 Atherosclerotic heart disease of native coronary artery without angina pectoris: Secondary | ICD-10-CM

## 2021-04-10 DIAGNOSIS — E785 Hyperlipidemia, unspecified: Secondary | ICD-10-CM | POA: Diagnosis not present

## 2021-04-10 DIAGNOSIS — I493 Ventricular premature depolarization: Secondary | ICD-10-CM | POA: Diagnosis not present

## 2021-04-10 DIAGNOSIS — I255 Ischemic cardiomyopathy: Secondary | ICD-10-CM | POA: Diagnosis not present

## 2021-04-10 MED ORDER — METOPROLOL SUCCINATE ER 25 MG PO TB24: 12.5000 mg | ORAL_TABLET | Freq: Every evening | ORAL | 3 refills | Status: DC

## 2021-04-10 NOTE — Patient Instructions (Signed)
Medication Instructions:   Start Toprol XL 12.5 mg Daily   Please call [facility] Surgery [phone]  *If you need a refill on your cardiac medications before your next appointment, please call your pharmacy*   Lab Work: NONE   If you have labs (blood work) drawn today and your tests are completely normal, you will receive your results only by: [place] (if you have MyChart) OR A paper copy in the mail If you have any lab test that is abnormal or we need to change your treatment, we will call you to review the results.   Testing/Procedures: NONE    Follow-Up: At [facility], you and your health needs are our priority.  As part of our continuing mission to provide you with exceptional heart care, we have created designated Provider Care Teams.  These Care Teams include your primary Cardiologist (physician) and Advanced Practice Providers (APPs -  Physician Assistants and Nurse Practitioners) who all work together to provide you with the care you need, when you need it.  We recommend signing up for the patient portal called "MyChart".  Sign up information is provided on this After Visit Summary.  MyChart is used to connect with patients for Virtual Visits (Telemedicine).  Patients are able to view lab/test results, encounter notes, upcoming appointments, etc.  Non-urgent messages can be sent to your provider as well.   To learn more about what you can do with MyChart, go to [email or web address].    Your next appointment:   3 month(s)  The format for your next appointment:   In Person  Provider:   Dorris Carnes, MD or Bernerd Pho, PA-C    Other Instructions Thank you for choosing [facility]!

## 2021-04-10 NOTE — Progress Notes (Signed)
Cardiology Office Note    Date:  04/10/2021   ID:  [name], DOB 1950-07-05, MRN [id number]  PCP:  Martinique, Betty G, MD  Cardiologist: Dorris Carnes, MD    Chief Complaint  Patient presents with   Follow-up    S/p cardiac catheterization    History of Present Illness:    [name] is a 71 y.o. female with past medical history of palpitations (prior monitor in 09/2020 showing PVC burden of 24%), HTN, HLD, Type 2 DM, anxiety and PTSD who presents to the office today for follow-up from her recent cardiac catheterization.   She was examined by myself in 02/2021 for follow-up from her recent stress test which had shown defects along the apical anterior/anterior septal distribution as well as basal inferior lateral distribution which was suggestive of scar and mild peri-infarct ischemia and [name] had recommended a catheterization for definitive evaluation. She was continued on ASA and Zetia (intolerant to statins) and was started on Toprol-XL 12.47m daily given her palpitations and PVC burden. Her catheterization was performed by Dr. JMartiniqueon 03/08/2021 and showed severe obstructive CAD with subtotal 99% mid-LAD and complex LCx disease with severe stenoses involving the bifurication of 4 branches with CT surgery consult recommended as LCx disease was not amenable to PCI. Did have a follow-up echo arranged by Dr. JMartiniquewhich showed her EF was mildly reduced at 45-50% with mild LVH, normal RV function and trivial MR. She did meet with Dr. LKipp Broodon 03/22/2021 and by review of notes was very hesitant about proceeding with surgical intervention and was informed to make them aware with her ultimate decision about proceeding with CABG.   In talking with the patient today, she is very anxious about possible upcoming CABG as she is concerned how she will do following surgery given her known spinal stenosis and neurogenic claudication. She has already reviewed this with Dr. CSaintclair Halsted She is  anticipating proceeding with surgery and will likely have this scheduled in 05/2021. She denies any chest pain or dyspnea on exertion but has experienced intermittent palpitations still. She did stop Toprol-XL following her last visit as she reports fatigue and muscle aches with the medication but was also taking Zetia. No reported orthopnea, PND or pitting edema. Does have some intermittent dyspnea at rest which improves with the use of PRN Albuterol.   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[facility]    Anxiety    Arthritis    Diabetes mellitus without complication (HCC)    GERD (gastroesophageal reflux disease)    Heart murmur    Hyperlipidemia    Hypertension    Neuromuscular disorder (HCC)    Palpitations    Pneumonia    PONV (postoperative nausea and vomiting)    PTSD (post-traumatic stress disorder)    Ringing of ears, left     Past Surgical History:  Procedure Laterality Date   ABDOMINAL HYSTERECTOMY     partial   ACHILLES TENDON SURGERY Left    APPENDECTOMY     BACK SURGERY     BLADDER SUSPENSION     BREAST SURGERY Right 2003   milk duct removed   CHOLECYSTECTOMY     FRACTURE SURGERY     collar bone   LEFT HEART CATH AND CORONARY ANGIOGRAPHY N/A 03/08/2021   Procedure: LEFT HEART CATH AND CORONARY ANGIOGRAPHY;  Surgeon: JMartinique Peter M, MD;  [facility];  Service: Cardiovascular;  Laterality: N/A;   NOSE  SURGERY     x 2    OTHER SURGICAL HISTORY     Breast Duct removed   PARTIAL HYSTERECTOMY     RECTOCELE REPAIR     TONSILLECTOMY     VASCULAR SURGERY Right    right leg   VEIN SURGERY Right    leg    Current Medications: Outpatient Medications Prior to Visit  Medication Sig Dispense Refill   ACCU-CHEK SOFTCLIX LANCETS lancets Use to test blood sugar 3 times daily. Dx: E11.8 100 each 12   albuterol (VENTOLIN HFA) 108 (90 Base) MCG/ACT inhaler INHALE 2 PUFFS INTO THE LUNGS EVERY 6 HOURS AS NEEDED FOR WHEEZING OR SHORTNESS OF  BREATH 18 g 2   Alcohol Swabs (B-D SINGLE USE SWABS REGULAR) PADS Use to test blood sugar 3 times daily 100 each 3   aspirin EC 81 MG tablet Take 81 mg by mouth daily. Swallow whole.     Blood Glucose Calibration (ACCU-CHEK AVIVA) SOLN Use as directed 1 each 2   Blood Glucose Monitoring Suppl (ACCU-CHEK AVIVA PLUS) w/Device KIT Use to check blood sugar 3 times daily 1 kit 1   clonazePAM (KLONOPIN) 1 MG tablet Take 1 tablet (1 mg total) by mouth 2 (two) times daily as needed for anxiety. 60 tablet 3   Dulaglutide (TRULICITY) 1.5 BH/4.1PF SOPN Inject 1.5 mg into the skin once a week. 13 mL 3   gabapentin (NEURONTIN) 600 MG tablet Take 1 tablet (600 mg total) by mouth in the morning, at noon, in the evening, and at bedtime. Discontinued 400 mg prescription from her profile. (Patient taking differently: Take 600 mg by mouth in the morning, at noon, in the evening, and at bedtime.) 120 tablet 3   glucose blood (ACCU-CHEK AVIVA) test strip Use to test blood sugar 3 times daily. Dx: 11.8 100 each 12   hydrochlorothiazide (HYDRODIURIL) 25 MG tablet TAKE 1 TABLET BY MOUTH EVERY DAY 90 tablet 3   HYDROcodone-acetaminophen (NORCO) 7.5-325 MG tablet Take 1 tablet by mouth every 6 (six) hours as needed for moderate pain. 120 tablet 0   ipratropium (ATROVENT) 0.06 % nasal spray Place 2 sprays into both nostrils 4 (four) times daily. 15 mL 3   Magnesium 250 MG TABS Take 250 mg by mouth daily.     omeprazole (PRILOSEC) 40 MG capsule Take 1 capsule (40 mg total) by mouth daily. 90 capsule 2   ondansetron (ZOFRAN-ODT) 4 MG disintegrating tablet Take 4 mg by mouth daily as needed for nausea/vomiting.     potassium chloride SA (KLOR-CON) 20 MEQ tablet Take 1 tablet (20 mEq total) by mouth daily. 30 tablet 3   QUEtiapine (SEROQUEL) 100 MG tablet Take 0.5 tablets (50 mg total) by mouth at bedtime. 45 tablet 3   triamcinolone (KENALOG) 0.1 % paste Use as directed 1 application in the mouth or throat 2 (two) times daily.  (Patient taking differently: Use as directed 1 application in the mouth or throat daily as needed (gums).) 5 g 1   ezetimibe (ZETIA) 10 MG tablet Take 1 tablet (10 mg total) by mouth daily. (Patient not taking: Reported on 04/10/2021) 30 tablet 3   metoprolol succinate (TOPROL XL) 25 MG 24 hr tablet Take 0.5 tablets (12.5 mg total) by mouth at bedtime. (Patient not taking: Reported on 03/05/2021) 90 tablet 3   No facility-administered medications prior to visit.     Allergies:   Hydromorphone, Morphine and related, Other, and Penicillins   Social History   Socioeconomic History  Marital status: Divorced    Spouse name: Not on file   Number of children: Not on file   Years of education: Not on file   Highest education level: Not on file  Occupational History   Not on file  Tobacco Use   Smoking status: Never   Smokeless tobacco: Never  Vaping Use   Vaping Use: Never used  Substance and Sexual Activity   Alcohol use: No   Drug use: No   Sexual activity: Not on file  Other Topics Concern   Not on file  Social History Narrative   Not on file   Social Determinants of Health   Financial Resource Strain: Low Risk    Difficulty of Paying Living Expenses: Not hard at all  Food Insecurity: No Food Insecurity   Worried About Running Out of Food in the Last Year: Never true   [place] in the Last Year: Never true  Transportation Needs: No Transportation Needs   Lack of Transportation (Medical): No   Lack of Transportation (Non-Medical): No  Physical Activity: Insufficiently Active   Days of Exercise per Week: 2 days   Minutes of Exercise per Session: 40 min  Stress: No Stress Concern Present   Feeling of Stress : Not at all  Social Connections: Moderately Integrated   Frequency of Communication with Friends and Family: Twice a week   Frequency of Social Gatherings with Friends and Family: Twice a week   Attends Religious Services: More than 4 times per year   Active  Member of [employer] or Organizations: Yes   Attends Archivist Meetings: 1 to 4 times per year   Marital Status: Widowed     Family History:  The patient's family history includes Alzheimer's disease in her mother; Diabetes in her mother; Esophageal cancer in her brother; Lung cancer in her father.   Review of Systems:    Please see the history of present illness.     All other systems reviewed and are otherwise negative except as noted above.   Physical Exam:    VS:  BP 110/78    Pulse 64    Ht _0  (1.727 m)    Wt 186 lb (84.4 kg)    SpO2 96%    BMI 28.28 kg/m    General: Well developed, well nourished,female appearing in no acute distress. Tearful at times when talking about her cath and upcoming surgery.  Head: Normocephalic, atraumatic. Neck: No carotid bruits. JVD not elevated.  Lungs: Respirations regular and unlabored, without wheezes or rales.  Heart: Regular rate and rhythm with occasional ectopic beats. No S3 or S4.  No murmur, no rubs, or gallops appreciated. Abdomen: Appears non-distended. No obvious abdominal masses. Msk:  Strength and tone appear normal for age. No obvious joint deformities or effusions. Extremities: No clubbing or cyanosis. No pitting edema.  Distal pedal pulses are 2+ bilaterally. Neuro: Alert and oriented X 3. Moves all extremities spontaneously. No focal deficits noted. Psych:  Responds to questions appropriately with a normal affect. Skin: No rashes or lesions noted  Wt Readings from Last 3 Encounters:  04/10/21 186 lb (84.4 kg)  03/22/21 182 lb (82.6 kg)  03/08/21 182 lb (82.6 kg)     Studies/Labs Reviewed:   EKG:  EKG is not ordered today.   Recent Labs: 07/02/2020: ALT 19 01/28/2021: Magnesium 1.6; TSH 0.49 03/04/2021: BUN 15; Creatinine, Ser 0.87; Hemoglobin 13.5; Platelets 210; Potassium 3.7; Sodium 138   Lipid Panel  Component Value Date/Time   CHOL 261 (H) 01/28/2021 1007   CHOL 183 12/16/2016 1517   TRIG 295.0 (H)  01/28/2021 1007   HDL 43.70 01/28/2021 1007   HDL 53 12/16/2016 1517   CHOLHDL 6 01/28/2021 1007   VLDL 59.0 (H) 01/28/2021 1007   LDLCALC 84 12/16/2016 1517   LDLDIRECT 140.0 01/28/2021 1007    Additional studies/ records that were reviewed today include:   LHC: 03/2021 Ost LAD to Prox LAD lesion is 40% stenosed.   Mid LAD lesion is 99% stenosed.   1st Diag lesion is 90% stenosed.   1st Mrg lesion is 80% stenosed.   2nd Mrg lesion is 90% stenosed.   Mid Cx lesion is 80% stenosed.   LV end diastolic pressure is normal.   There is no aortic valve stenosis.   Severe obstructive CAD with left dominant circulation Subtotal mid LAD stenosis Complex LCx disease with severe stenoses involving bifurcation of 4 branches of the LCx. Normal LVEDP   Plan: needs referral to CT surgery for CABG. The LCx disease is not amenable to PCI.   Echocardiogram: 03/2021 IMPRESSIONS     1. Left ventricular ejection fraction, by estimation, is 45 to 50%. The  left ventricle has mildly decreased function. The left ventricle  demonstrates regional wall motion abnormalities (see scoring  diagram/findings for description). There is mild left  ventricular hypertrophy. Left ventricular diastolic parameters are  indeterminate.   2. Right ventricular systolic function is normal. The right ventricular  size is normal. There is normal pulmonary artery systolic pressure. The  estimated right ventricular systolic pressure is 20.8 mmHg.   3. Left atrial size was mildly dilated.   4. The mitral valve is normal in structure. Trivial mitral valve  regurgitation.   5. The aortic valve is tricuspid. Aortic valve regurgitation is not  visualized. Aortic valve sclerosis/calcification is present, without any  evidence of aortic stenosis.   6. The inferior vena cava is dilated in size with >50% respiratory  variability, suggesting right atrial pressure of 8 mmHg.    Assessment:    1. Coronary artery disease  involving native coronary artery of native heart without angina pectoris   2. Ischemic cardiomyopathy   3. PVC's (premature ventricular contractions)   4. Hyperlipidemia LDL goal <70      Plan:   In order of problems listed above:  1. CAD - Recent catheterization as outlined above showed severe obstructive disease with subtotal 99% mid-LAD and complex LCx disease with severe stenoses involving the bifurication of 4 branches with CT Surgery consult recommended.  - She has been very anxious about proceeding with CABG given her underlying back and nerve issues but is making plans to proceed and possibly having this in 05/2021. I did encourage her to call CT Surgery and make them aware as she will likely need a follow-up visit with Dr. Kipp Brood and will need to arrange for pre-procedure testing.  - Continue ASA 81mg  daily. Will plan to restart Toprol-XL 12.5mg  daily as she discontinued this in the interim. Previously intolerant to multiple statins and also Zetia so will need referral to the [facility] following CABG.   2. Ischemic Cardiomyopathy - EF was previously normal by prior echocardiogram imaging but reduced at 45-50% by echo in 03/2021. I encouraged her to strongly consider restarting Toprol-XL 12.5mg  daily. If intolerant to this, could try Bisoprolol. She is very hesitant to medication changes given her intolerance to medications so would make gradual adjustments.   3.  PVC's - She did have a 24% PVC burden by prior monitor in 09/2020 and likely due to underlying ischemia. She reports fatigue and muscle aches with Toprol-XL in the past but was also on statin therapy and Zetia. I encouraged her to retry Toprol-XL 12.32m daily. If intolerant, she will make [place] and could try Bisoprolol 2.5110mdaily.   4. HLD - Her LDL was elevated to 140 in 01/2021 and her goal is now less than 5541ith documented CAD. She has been intolerant to multiple statins in the past and had muscle aches and  fatigue with Zetia. She is already under significant stress and very anxious about her upcoming surgery. Will plan to add a BB as outlined above. At follow-up, [facility] such as PCSK-9 inhibitor therapy if she is open to this.    Medication Adjustments/Labs and Tests Ordered: Current medicines are reviewed at length with the patient today.  Concerns regarding medicines are outlined above.  Medication changes, Labs and Tests ordered today are listed in the Patient Instructions below. Patient Instructions  Medication Instructions:   Start Toprol XL 12.5 mg Daily   Please call CT Surgery [phone]*If you need a refill on your cardiac medications before your next appointment, please call your pharmacy*   Lab Work: NONE   If you have labs (blood work) drawn today and your tests are completely normal, you will receive your results only by: MyForestif you have MyChart) OR A paper copy in the mail If you have any lab test that is abnormal or we need to change your treatment, we will call you to review the results.   Testing/Procedures: NONE    Follow-Up: [facility].  As part of our continuing mission to provide you with exceptional heart care, we have created designated Provider Care Teams.  These Care Teams include your primary Cardiologist (physician) and Advanced Practice Providers (APPs -  Physician Assistants and Nurse Practitioners) who all work together to provide you with the care you need, when you need it.  We recommend signing up for the patient portal called "MyChart".  Sign up information is provided on this After Visit Summary.  MyChart is used to connect with patients for Virtual Visits (Telemedicine).  Patients are able to view lab/test results, encounter notes, upcoming appointments, etc.  Non-urgent messages can be sent to your provider as well.    To learn more about what you can do with MyChart, go to [email or web address]   Your next appointment:   3 month(s)  The format for your next appointment:   In Person  Provider:   PaDorris CarnesMD or BrBernerd PhoPA-C    Other Instructions Thank you for choosing [facility]     Signed, BrErma HeritagePA-C  04/10/2021 7:56 PM    [facility] [id number] [id number]: [phone]: [phone]

## 2021-04-11 ENCOUNTER — Other Ambulatory Visit: Payer: Self-pay

## 2021-04-11 ENCOUNTER — Encounter: Payer: Medicare Other | Attending: Physical Medicine & Rehabilitation | Admitting: Registered Nurse

## 2021-04-11 ENCOUNTER — Encounter: Payer: Self-pay | Admitting: Registered Nurse

## 2021-04-11 VITALS — BP 113/57 | HR 70 | Temp 97.7°F | Ht 68.0 in | Wt 186.4 lb

## 2021-04-11 DIAGNOSIS — M5416 Radiculopathy, lumbar region: Secondary | ICD-10-CM | POA: Diagnosis not present

## 2021-04-11 DIAGNOSIS — G894 Chronic pain syndrome: Secondary | ICD-10-CM | POA: Insufficient documentation

## 2021-04-11 DIAGNOSIS — Z79891 Long term (current) use of opiate analgesic: Secondary | ICD-10-CM | POA: Diagnosis not present

## 2021-04-11 DIAGNOSIS — M5412 Radiculopathy, cervical region: Secondary | ICD-10-CM | POA: Insufficient documentation

## 2021-04-11 DIAGNOSIS — Z5181 Encounter for therapeutic drug level monitoring: Secondary | ICD-10-CM | POA: Diagnosis not present

## 2021-04-11 DIAGNOSIS — E1142 Type 2 diabetes mellitus with diabetic polyneuropathy: Secondary | ICD-10-CM | POA: Diagnosis not present

## 2021-04-11 DIAGNOSIS — M542 Cervicalgia: Secondary | ICD-10-CM | POA: Insufficient documentation

## 2021-04-11 DIAGNOSIS — M47812 Spondylosis without myelopathy or radiculopathy, cervical region: Secondary | ICD-10-CM | POA: Insufficient documentation

## 2021-04-11 MED ORDER — GABAPENTIN 600 MG PO TABS: 600.0000 mg | ORAL_TABLET | Freq: Four times a day (QID) | ORAL | 3 refills | Status: DC

## 2021-04-11 MED ORDER — HYDROCODONE-ACETAMINOPHEN 7.5-325 MG PO TABS: 1.0000 | ORAL_TABLET | Freq: Four times a day (QID) | ORAL | 0 refills | Status: DC | PRN

## 2021-04-11 NOTE — Progress Notes (Signed)
Subjective:    Patient ID: [name], female    DOB: 08-23-1950, 71 y.o.   MRN: [id number]  HPI: [name] is a 71 y.o. female who returns for follow up appointment for chronic pain and medication refill. She states her pain is located in her neck radiating into her left shoulder and left arm, lower back pain radiating into her left buttock.and left hip. Also reports bilateral feet with numbness. She rates her pain 9. Her current exercise regime is walking and performing stretching exercises.  [name] equivalent is 30.00 MME.  She  is also prescribed Clonazepam  by Dr. Martinique .We have discussed the black box warning of using opioids and benzodiazepines. I highlighted the dangers of using these drugs together and discussed the adverse events including respiratory suppression, overdose, cognitive impairment and importance of compliance with current regimen. We will continue to monitor and adjust as indicated.   Oral Swab was Performed today.     Pain Inventory Average Pain 9 Pain Right Now 9 My pain is sharp, dull, stabbing, tingling, and aching  In the last 24 hours, has pain interfered with the following? General activity 7 Relation with others 7 Enjoyment of life 8 What TIME of day is your pain at its worst? morning , daytime, and night Sleep (in general) Fair  Pain is worse with: walking, bending, sitting, inactivity, standing, and some activites Pain improves with: rest, heat/ice, pacing activities, medication, TENS, and injections Relief from Meds: 65  Family History  Problem Relation Age of Onset   Diabetes Mother    Alzheimer's disease Mother    Lung cancer Father    Esophageal cancer Brother    Colon cancer Neg Hx    Social History   Socioeconomic History   Marital status: Divorced    Spouse name: Not on file   Number of children: Not on file   Years of education: Not on file   Highest education level: Not on file  Occupational  History   Not on file  Tobacco Use   Smoking status: Never   Smokeless tobacco: Never  Vaping Use   Vaping Use: Never used  Substance and Sexual Activity   Alcohol use: No   Drug use: No   Sexual activity: Not on file  Other Topics Concern   Not on file  Social History Narrative   Not on file   Social Determinants of Health   Financial Resource Strain: Low Risk    Difficulty of Paying Living Expenses: Not hard at all  Food Insecurity: No Food Insecurity   Worried About Charity fundraiser in the Last Year: Never true   Ran Out of Food in the Last Year: Never true  Transportation Needs: No Transportation Needs   Lack of Transportation (Medical): No   Lack of Transportation (Non-Medical): No  Physical Activity: Insufficiently Active   Days of Exercise per Week: 2 days   Minutes of Exercise per Session: 40 min  Stress: No Stress Concern Present   Feeling of Stress : Not at all  Social Connections: Moderately Integrated   Frequency of Communication with Friends and Family: Twice a week   Frequency of Social Gatherings with Friends and Family: Twice a week   Attends Religious Services: More than 4 times per year   Active Member of [employer] or Organizations: Yes   Attends Archivist Meetings: 1 to 4 times per year   Marital Status: Widowed   Past Surgical  History:  Procedure Laterality Date   ABDOMINAL HYSTERECTOMY     partial   ACHILLES TENDON SURGERY Left    APPENDECTOMY     BACK SURGERY     BLADDER SUSPENSION     BREAST SURGERY Right 2003   milk duct removed   CHOLECYSTECTOMY     FRACTURE SURGERY     collar bone   LEFT HEART CATH AND CORONARY ANGIOGRAPHY N/A 03/08/2021   Procedure: LEFT HEART CATH AND CORONARY ANGIOGRAPHY;  Surgeon: Martinique, Peter M, MD;  Location: [facility];  Service: Cardiovascular;  Laterality: N/A;   NOSE SURGERY     x 2    OTHER SURGICAL HISTORY     Breast Duct removed   PARTIAL HYSTERECTOMY     RECTOCELE REPAIR      TONSILLECTOMY     VASCULAR SURGERY Right    right leg   VEIN SURGERY Right    leg   Past Surgical History:  Procedure Laterality Date   ABDOMINAL HYSTERECTOMY     partial   ACHILLES TENDON SURGERY Left    APPENDECTOMY     BACK SURGERY     BLADDER SUSPENSION     BREAST SURGERY Right 2003   milk duct removed   CHOLECYSTECTOMY     FRACTURE SURGERY     collar bone   LEFT HEART CATH AND CORONARY ANGIOGRAPHY N/A 03/08/2021   Procedure: LEFT HEART CATH AND CORONARY ANGIOGRAPHY;  Surgeon: Martinique, Peter M, MD;  Location: [facility];  Service: Cardiovascular;  Laterality: N/A;   NOSE SURGERY     x 2    OTHER SURGICAL HISTORY     Breast Duct removed   PARTIAL HYSTERECTOMY     RECTOCELE REPAIR     TONSILLECTOMY     VASCULAR SURGERY Right    right leg   VEIN SURGERY Right    leg   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HCC)    Anxiety    Arthritis    Diabetes mellitus without complication (HCC)    GERD (gastroesophageal reflux disease)    Heart murmur    Hyperlipidemia    Hypertension    Neuromuscular disorder (HCC)    Palpitations    Pneumonia    PONV (postoperative nausea and vomiting)    PTSD (post-traumatic stress disorder)    Ringing of ears, left    BP (!) 113/57    Pulse 70    Ht 5\' 8"  (1.727 m)    Wt 186 lb 6.4 oz (84.6 kg)    SpO2 95%    BMI 28.34 kg/m   Opioid Risk Score:   Fall Risk Score:  `1  Depression screen PHQ 2/9  Depression screen [facility] 2/9 04/11/2021 12/06/2020 11/06/2020 11/06/2020 10/11/2020 08/24/2020 04/10/2020  Decreased Interest 0 1 0 0 1 2 1   Down, Depressed, Hopeless 1 1 0 0 1 2 1   PHQ - 2 Score 1 2 0 0 2 4 2   Altered sleeping - - - - - 3 -  Tired, decreased energy - - - - - 2 -  Change in appetite - - - - - 1 -  Feeling bad or failure about yourself  - - - - - 0 -  Trouble concentrating - - - - - 0 -  Moving slowly or fidgety/restless - - - - - 0 -  Suicidal thoughts - - - - - 0 -  PHQ-9 Score - - - - -  10 -  Difficult  doing work/chores - - - - - Very difficult -  Some recent data might be hidden     Review of Systems  Constitutional: Negative.   HENT: Negative.    Eyes: Negative.   Respiratory: Negative.    Cardiovascular: Negative.   Gastrointestinal: Negative.   Endocrine: Negative.   Genitourinary: Negative.   Musculoskeletal:  Positive for back pain.  Skin: Negative.   Allergic/Immunologic: Negative.   Neurological: Negative.   Hematological: Negative.   Psychiatric/Behavioral:  Positive for dysphoric mood.       Objective:   Physical Exam Vitals and nursing note reviewed.  Constitutional:      Appearance: Normal appearance.  Cardiovascular:     Rate and Rhythm: Normal rate and regular rhythm.     Pulses: Normal pulses.     Heart sounds: Normal heart sounds.  Pulmonary:     Effort: Pulmonary effort is normal.     Breath sounds: Normal breath sounds.  Musculoskeletal:     Cervical back: Normal range of motion and neck supple.     Comments: Normal Muscle Bulk and Muscle Testing Reveals:  Upper Extremities: Full ROM and Muscle Strength on Right 5/5 and Left 4/5  Left AC Joint Tenderness Lumbar Paraspinal Tenderness: L-4-L-5 Mainly Left Side Left Greater Trochanter Tenderness Lower Extremities: Full ROM and Muscle Strength 5/5 Arises from Chair Slowly Narrow Based  Gait     Skin:    General: Skin is warm and dry.  Neurological:     Mental Status: She is alert and oriented to person, place, and time.  Psychiatric:        Mood and Affect: Mood normal.        Behavior: Behavior normal.         Assessment & Plan:  1.Chronic cervicalgia with documented spondylosis on MRI, per Dr. Naaman Plummer Note and Cervical Radiculitis: Continue Gabapentin,  Continue HEP as Tolerated. 04/11/2021 2. Left shoulder pain most consistent with left rotator cuff syndrome. Mild DJD.  Continue HEP as tolerated.Continue to monitor. 04/11/2020. Refilled: Hydrocodone 7.5/325 mg one tablet every 6 hours as  needed for pain. #120. Second script sent for the following month. We will continue the opioid monitoring program, this consists of regular clinic visits, examinations, urine drug screen, pill counts as well as use of [place] Controlled Substance Reporting system. A 12 month History has been reviewed on the [place] on 04/11/2021. 3. Left wrist/finger pain most consistent with OA, ?post traumatic. No complaints today. 04/11/2021 4. Anxiety: Continue Klonopin, PCP prescribing. Continue to monitor. 04/11/2021 5. Restless Leg syndrome: Continue Gabapentin. 04/11/2021 7. Sacroiliac inflammation: S/P Right  SI injection on 09/20/2019, with No relief noted. Ms. Sperry seen Dr Marlou Sa in regards to her pain. Dr Saintclair Halsted. Following.  Continue to Monitor. 04/11/2021. 8. Myofascial Pain: Continue Robaxin as needed. 04/11/2021.  9. Bilateral feet with neuropathic pain: Increase Gabapentin 600 mg 4 times a day. Continue to monitor. 04/11/2021. 10.  Herniated Nucleus Pulposus L2-3 with recurrent disc rupture: S/P  Posterior Lumbar Interbody Fusion  - Lumbar two-Lumbar three on 01/13/2020 by Dr Saintclair Halsted.  Dr Saintclair Halsted Following. 04/11/2021   F/U in 2 months

## 2021-04-14 ENCOUNTER — Other Ambulatory Visit: Payer: Self-pay | Admitting: Family Medicine

## 2021-04-14 ENCOUNTER — Encounter: Payer: Self-pay | Admitting: Family Medicine

## 2021-04-14 DIAGNOSIS — E876 Hypokalemia: Secondary | ICD-10-CM

## 2021-04-15 LAB — DRUG TOX MONITOR 1 W/CONF, ORAL FLD
Alprazolam: NEGATIVE ng/mL (ref <0.50)
Amphetamines: NEGATIVE ng/mL (ref <10)
Barbiturates: NEGATIVE ng/mL (ref <10)
Benzodiazepines: POSITIVE ng/mL — AB (ref <0.50)
Buprenorphine: NEGATIVE ng/mL (ref <0.10)
Chlordiazepoxide: NEGATIVE ng/mL (ref <0.50)
Clonazepam: 0.56 ng/mL — ABNORMAL HIGH (ref <0.50)
Cocaine: NEGATIVE ng/mL (ref <5.0)
Codeine: NEGATIVE ng/mL (ref <2.5)
Diazepam: NEGATIVE ng/mL (ref <0.50)
Dihydrocodeine: 10.9 ng/mL — ABNORMAL HIGH (ref <2.5)
Fentanyl: NEGATIVE ng/mL (ref <0.10)
Flunitrazepam: NEGATIVE ng/mL (ref <0.50)
Flurazepam: NEGATIVE ng/mL (ref <0.50)
Heroin Metabolite: NEGATIVE ng/mL (ref <1.0)
Hydrocodone: 146.3 ng/mL — ABNORMAL HIGH (ref <2.5)
Hydromorphone: NEGATIVE ng/mL (ref <2.5)
Lorazepam: NEGATIVE ng/mL (ref <0.50)
MARIJUANA: NEGATIVE ng/mL (ref <2.5)
MDMA: NEGATIVE ng/mL (ref <10)
Meprobamate: NEGATIVE ng/mL (ref <2.5)
Methadone: NEGATIVE ng/mL (ref <5.0)
Midazolam: NEGATIVE ng/mL (ref <0.50)
Morphine: NEGATIVE ng/mL (ref <2.5)
Nicotine Metabolite: NEGATIVE ng/mL (ref <5.0)
Nordiazepam: NEGATIVE ng/mL (ref <0.50)
Norhydrocodone: 6.9 ng/mL — ABNORMAL HIGH (ref <2.5)
Noroxycodone: NEGATIVE ng/mL (ref <2.5)
Opiates: POSITIVE ng/mL — AB (ref <2.5)
Oxazepam: NEGATIVE ng/mL (ref <0.50)
Oxycodone: NEGATIVE ng/mL (ref <2.5)
Oxymorphone: NEGATIVE ng/mL (ref <2.5)
Phencyclidine: NEGATIVE ng/mL (ref <10)
Tapentadol: NEGATIVE ng/mL (ref <5.0)
Temazepam: NEGATIVE ng/mL (ref <0.50)
Tramadol: NEGATIVE ng/mL (ref <5.0)
Triazolam: NEGATIVE ng/mL (ref <0.50)
Zolpidem: NEGATIVE ng/mL (ref <5.0)

## 2021-04-15 LAB — DRUG TOX ALC METAB W/CON, ORAL FLD: Alcohol Metabolite: NEGATIVE ng/mL (ref <25)

## 2021-04-15 MED ORDER — POTASSIUM CHLORIDE CRYS ER 20 MEQ PO TBCR: 20.0000 meq | EXTENDED_RELEASE_TABLET | Freq: Every day | ORAL | 3 refills | Status: DC

## 2021-04-17 ENCOUNTER — Other Ambulatory Visit: Payer: Self-pay | Admitting: Family Medicine

## 2021-04-19 ENCOUNTER — Telehealth (INDEPENDENT_AMBULATORY_CARE_PROVIDER_SITE_OTHER): Payer: Medicare Other | Admitting: Family Medicine

## 2021-04-19 ENCOUNTER — Encounter: Payer: Self-pay | Admitting: Family Medicine

## 2021-04-19 VITALS — Ht 68.0 in

## 2021-04-19 DIAGNOSIS — I2510 Atherosclerotic heart disease of native coronary artery without angina pectoris: Secondary | ICD-10-CM

## 2021-04-19 DIAGNOSIS — F32A Depression, unspecified: Secondary | ICD-10-CM

## 2021-04-19 DIAGNOSIS — F419 Anxiety disorder, unspecified: Secondary | ICD-10-CM

## 2021-04-19 DIAGNOSIS — R319 Hematuria, unspecified: Secondary | ICD-10-CM | POA: Diagnosis not present

## 2021-04-19 DIAGNOSIS — N390 Urinary tract infection, site not specified: Secondary | ICD-10-CM

## 2021-04-19 MED ORDER — SULFAMETHOXAZOLE-TRIMETHOPRIM 800-160 MG PO TABS: 1.0000 | ORAL_TABLET | Freq: Two times a day (BID) | ORAL | 0 refills | Status: AC

## 2021-04-19 NOTE — Progress Notes (Signed)
Virtual Visit via Video Note I connected with [name] on 04/19/21 by a video enabled telemedicine application and verified that I am speaking with the correct person using two identifiers.  Location patient: home Location provider:work office Persons participating in the virtual visit: patient, provider  I discussed the limitations of evaluation and management by telemedicine and the availability of in person appointments. The patient expressed understanding and agreed to proceed.  Chief Complaint  Patient presents with   uti symptoms    Started this week, having the urge to go and only dribbling. Painful, slight tinge of blood. No fever.    HPI: [name] is a 71 yo female with hx of chronic pain ,anxiety,HTN,and DM II c/o a couple days of dysuria and urinary symptoms as described above. She was last seen on 12/18/20. She thinks she had blood in urine x 1. She has had UTI's in the past and has similar symptoms. Negative for fever,chills,abdominal pain,N/V,decreased urine output, vaginal bleeding,or vaginal discharge. Post menopausal. Not sexually active. No hx of nephrolithiasis or pyelonephritis.  She had 4 tabs left from last UTI treatment, she does not remembers name or medication. Last treated for UTI in 11/2020, Ucx was not ordered.   Ref Range & Units 5 mo ago  RBC, UA 0 - 5 /HPF >25 High    WBC, UA <=5 /HPF >25 High    Squam Epithel, UA 0 - 10 /HPF 6   Bacteria, UA Few, Occasional, Small /HPF Large Abnormal    Resulting [place]   She has been drinking more coffee and less water. Started cranberry caps.  Since her last visit she has been following with cardiologist. She was initially referred to cardiologist because irregular HR/cardiac arrhythmia. Had an abnormal stress tests, showed defects along the apical anterior/anterior septal distribution as well as basal inferior lateral distribution. She underwent cardiac cath on  03/08/2021, which showed coronary artery stenosis involving more than 3 vessels.  She has decided to go ahead for CABG surgery. Negative for CP,SOB,palpitations,or diaphoresis.  She has lost about 30 pounds since her last visit, BS seems to be better controlled. She is trying to eat healthier. Anxiety and depression have been stable. She is working part time and really enjoys doing so. She is on Clonazepam 1 mg bid prn, which [place] still helping. She is also on Seroquel 100 mg 1/2 tab at bedtime.  ROS: See pertinent positives and negatives per HPI.  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[place])    Anxiety    Arthritis    Diabetes mellitus without complication (HCC)    GERD (gastroesophageal reflux disease)    Heart murmur    Hyperlipidemia    Hypertension    Neuromuscular disorder (HCC)    Palpitations    Pneumonia    PONV (postoperative nausea and vomiting)    PTSD (post-traumatic stress disorder)    Ringing of ears, left     Past Surgical History:  Procedure Laterality Date   ABDOMINAL HYSTERECTOMY     partial   ACHILLES TENDON SURGERY Left    APPENDECTOMY     BACK SURGERY     BLADDER SUSPENSION     BREAST SURGERY Right 2003   milk duct removed   CHOLECYSTECTOMY     FRACTURE SURGERY     collar bone   LEFT HEART CATH AND CORONARY ANGIOGRAPHY N/A 03/08/2021   Procedure: LEFT HEART CATH AND CORONARY ANGIOGRAPHY;  Surgeon:  Martinique, Peter M, MD;  Location: [facility];  Service: Cardiovascular;  Laterality: N/A;   NOSE SURGERY     x 2    OTHER SURGICAL HISTORY     Breast Duct removed   PARTIAL HYSTERECTOMY     RECTOCELE REPAIR     TONSILLECTOMY     VASCULAR SURGERY Right    right leg   VEIN SURGERY Right    leg    Family History  Problem Relation Age of Onset   Diabetes Mother    Alzheimer's disease Mother    Lung cancer Father    Esophageal cancer Brother    Colon cancer Neg Hx     Social History   Socioeconomic History   Marital  status: Divorced    Spouse name: Not on file   Number of children: Not on file   Years of education: Not on file   Highest education level: Not on file  Occupational History   Not on file  Tobacco Use   Smoking status: Never   Smokeless tobacco: Never  Vaping Use   Vaping Use: Never used  Substance and Sexual Activity   Alcohol use: No   Drug use: No   Sexual activity: Not on file  Other Topics Concern   Not on file  Social History Narrative   Not on file   Social Determinants of Health   Financial Resource Strain: Low Risk    Difficulty of Paying Living Expenses: Not hard at all  Food Insecurity: No Food Insecurity   Worried About Charity fundraiser in the Last Year: Never true   Ran Out of Food in the Last Year: Never true  Transportation Needs: No Transportation Needs   Lack of Transportation (Medical): No   Lack of Transportation (Non-Medical): No  Physical Activity: Insufficiently Active   Days of Exercise per Week: 2 days   Minutes of Exercise per Session: 40 min  Stress: No Stress Concern Present   Feeling of Stress : Not at all  Social Connections: Moderately Integrated   Frequency of Communication with Friends and Family: Twice a week   Frequency of Social Gatherings with Friends and Family: Twice a week   Attends Religious Services: More than 4 times per year   Active Member of [employer] or Organizations: Yes   Attends Archivist Meetings: 1 to 4 times per year   Marital Status: Widowed  Human resources officer Violence: Not At Risk   Fear of Current or Ex-Partner: No   Emotionally Abused: No   Physically Abused: No   Sexually Abused: No    Current Outpatient Medications:    ACCU-CHEK SOFTCLIX LANCETS lancets, Use to test blood sugar 3 times daily. Dx: E11.8, Disp: 100 each, Rfl: 12   albuterol (VENTOLIN HFA) 108 (90 Base) MCG/ACT inhaler, INHALE 2 PUFFS INTO THE LUNGS EVERY 6 HOURS AS NEEDED FOR WHEEZING OR SHORTNESS OF BREATH, Disp: 18 g, Rfl: 2    Alcohol Swabs (B-D SINGLE USE SWABS REGULAR) PADS, Use to test blood sugar 3 times daily, Disp: 100 each, Rfl: 3   aspirin EC 81 MG tablet, Take 81 mg by mouth daily. Swallow whole., Disp: , Rfl:    Blood Glucose Calibration (ACCU-CHEK AVIVA) SOLN, Use as directed, Disp: 1 each, Rfl: 2   Blood Glucose Monitoring Suppl (ACCU-CHEK AVIVA PLUS) w/Device KIT, Use to check blood sugar 3 times daily, Disp: 1 kit, Rfl: 1   clonazePAM (KLONOPIN) 1 MG tablet, Take 1 tablet (1  mg total) by mouth 2 (two) times daily as needed for anxiety., Disp: 60 tablet, Rfl: 3   Dulaglutide (TRULICITY) 1.5 HQ/1.9XJ SOPN, Inject 1.5 mg into the skin once a week., Disp: 13 mL, Rfl: 3   gabapentin (NEURONTIN) 600 MG tablet, Take 1 tablet (600 mg total) by mouth in the morning, at noon, in the evening, and at bedtime. Cancel order at [facility] on [address], Disp: 120 tablet, Rfl: 3   glucose blood (ACCU-CHEK AVIVA) test strip, Use to test blood sugar 3 times daily. Dx: 11.8, Disp: 100 each, Rfl: 12   hydrochlorothiazide (HYDRODIURIL) 25 MG tablet, TAKE 1 TABLET BY MOUTH EVERY DAY, Disp: 90 tablet, Rfl: 3   HYDROcodone-acetaminophen (NORCO) 7.5-325 MG tablet, Take 1 tablet by mouth every 6 (six) hours as needed for moderate pain., Disp: 120 tablet, Rfl: 0   ipratropium (ATROVENT) 0.06 % nasal spray, Place 2 sprays into both nostrils 4 (four) times daily., Disp: 15 mL, Rfl: 3   Magnesium 250 MG TABS, Take 250 mg by mouth daily., Disp: , Rfl:    metoprolol succinate (TOPROL XL) 25 MG 24 hr tablet, Take 0.5 tablets (12.5 mg total) by mouth at bedtime., Disp: 45 tablet, Rfl: 3   omeprazole (PRILOSEC) 40 MG capsule, Take 1 capsule (40 mg total) by mouth daily., Disp: 90 capsule, Rfl: 2   ondansetron (ZOFRAN-ODT) 4 MG disintegrating tablet, Take 4 mg by mouth daily as needed for nausea/vomiting., Disp: , Rfl:    potassium chloride SA (KLOR-CON M) 20 MEQ tablet, Take 1 tablet (20 mEq total) by mouth daily., Disp: 30 tablet, Rfl: 3    QUEtiapine (SEROQUEL) 100 MG tablet, Take 0.5 tablets (50 mg total) by mouth at bedtime., Disp: 45 tablet, Rfl: 3   triamcinolone (KENALOG) 0.1 % paste, Use as directed 1 application in the mouth or throat 2 (two) times daily. (Patient taking differently: Use as directed 1 application in the mouth or throat daily as needed (gums).), Disp: 5 g, Rfl: 1   ezetimibe (ZETIA) 10 MG tablet, Take 1 tablet (10 mg total) by mouth daily. (Patient not taking: Reported on 04/10/2021), Disp: 30 tablet, Rfl: 3  EXAM:  VITALS per patient if applicable:Ht <[id number]>_9<\/[id number]>_5  (1.727 m)    BMI 28.34 kg/m   GENERAL: alert, oriented, appears well and in no acute distress  HEENT: atraumatic, conjunctiva clear, no obvious abnormalities on inspection.  NECK: normal movements of the head and neck  LUNGS: on inspection no signs of respiratory distress, breathing rate appears normal, no obvious gross SOB, gasping or wheezing  CV: no obvious cyanosis  MS: moves all visible extremities without noticeable abnormality  PSYCH/NEURO: pleasant and cooperative, no obvious depression or anxiety, speech and thought processing grossly intact  ASSESSMENT AND PLAN:  Discussed the following assessment and plan:  Urinary tract infection with hematuria, site unspecified - Plan: sulfamethoxazole-trimethoprim (BACTRIM DS) 800-160 MG tablet She is at home now, almost an hour drive and lab is about to close, so empiric treatment with Bactrim DS bid x 5 d recommended. Decrease coffee intake and increase water intake. Continue cranberry caps daily. Clearly instructed about warning signs. If symptoms do not resolved UA and Ucx will be ordered.  Coronary artery disease involving native coronary artery of native heart without angina pectoris Asymptomatic. Planning on undergoing CABG. Following with cardiologist.  Anxiety and depression Stable overall. No changes in Clonazepam or Seroquel dose.  We discussed possible serious and likely  etiologies, options for evaluation and workup, limitations of telemedicine visit  vs in person visit, treatment, treatment risks and precautions. The patient was advised to call back or seek an in-person evaluation if the symptoms worsen or if the condition fails to improve as anticipated. I discussed the assessment and treatment plan with the patient. The patient was provided an opportunity to ask questions and all were answered. The patient agreed with the plan and demonstrated an understanding of the instructions.  Return if symptoms worsen or fail to improve, for Keep next appt..  Kortne All G. Martinique, MD  [facility] [place] office.

## 2021-04-20 ENCOUNTER — Encounter: Payer: Self-pay | Admitting: Family Medicine

## 2021-04-22 ENCOUNTER — Encounter: Payer: Self-pay | Admitting: Family Medicine

## 2021-04-22 NOTE — Progress Notes (Signed)
ACUTE VISIT Chief Complaint  Patient presents with   crackling in chest    Ongoing for about a week.   HPI: [name] is a 71 y.o. female, who is here today complaining of 1-2 weeks ago of "cracking/whistle"like noise in upper chest at night when lying down. It wakes her up.  Last night did not hear it. No problem during the day.  She has had wheezing before, usually associated to respiratory tract infection. She has been using Albuterol inh almost daily even without symptoms. No cough or SOB. She is concerned about possible serious process. Negative for fever,chills,changes in appetite, or unusual fatigue. No known sleep apnea. GERD on Omeprazole 40 mg daily. No dysphagia for food but has difficulty swallowing big tabs like KLOR, she has to split tab. She has not had heartburn. + Post nasal drainage.  Peripheral neuropathy: Feet numbness was spreading to pretibial area. Improved with increasing dose of gabapentin to 2400 mg daily. She has an appt with podiatrist.  DM II: She is on Trulicity 1.5 mg weekly. She has tolerated medication well, no side effects. She is trying to follow dietary recommendations. Not checking BS's.  Last visit, virtual,c/o urinary symptoms. She is on Bactrim DS bid. Urinary symptoms have resolved.  She is concerned about recovery process after heart surgery. She was planning on lower back surgery but it has been postponed until she fully recovers from cardiac surgery. HTN on HCTZ 25 mg daily and Metoprolol succinate 25 mg 1/2 tab daily. Tolerating medications well. Lab Results  Component Value Date   CREATININE 0.87 03/04/2021   BUN 15 03/04/2021   NA 138 03/04/2021   K 3.7 03/04/2021   CL 104 03/04/2021   CO2 23 03/04/2021   HypoK+ on KLOR 20 meq daily.  She does not want to go to a SNF, she is planning on staying with her son for a couple weeks.  Aortic atherosclerosis seen on CXR in 02/2018:Atherosclerotic calcification  aorta. She has not tolerated statins, muscle aches. She is on Zetia 10 mg daily.  Lab Results  Component Value Date   CHOL 261 (H) 01/28/2021   HDL 43.70 01/28/2021   LDLCALC 84 12/16/2016   LDLDIRECT 140.0 01/28/2021   TRIG 295.0 (H) 01/28/2021   CHOLHDL 6 01/28/2021   Review of Systems  HENT:  Negative for congestion and sore throat.   Cardiovascular:  Negative for chest pain, palpitations and leg swelling.  Gastrointestinal:  Negative for abdominal pain, nausea and vomiting.  Genitourinary:  Negative for decreased urine volume, dysuria and hematuria.  Musculoskeletal:  Positive for arthralgias and back pain. Negative for gait problem.  Skin:  Negative for pallor and rash.  Allergic/Immunologic: Positive for environmental allergies.  Neurological:  Negative for syncope, weakness and headaches.  Psychiatric/Behavioral:  Negative for confusion. The patient is nervous/anxious.   Rest see pertinent positives and negatives per HPI.  Current Outpatient Medications on File Prior to Visit  Medication Sig Dispense Refill   ACCU-CHEK SOFTCLIX LANCETS lancets Use to test blood sugar 3 times daily. Dx: E11.8 100 each 12   albuterol (VENTOLIN HFA) 108 (90 Base) MCG/ACT inhaler INHALE 2 PUFFS INTO THE LUNGS EVERY 6 HOURS AS NEEDED FOR WHEEZING OR SHORTNESS OF BREATH 18 g 2   Alcohol Swabs (B-D SINGLE USE SWABS REGULAR) PADS Use to test blood sugar 3 times daily 100 each 3   aspirin EC 81 MG tablet Take 81 mg by mouth daily. Swallow whole.     Blood  Glucose Calibration (ACCU-CHEK AVIVA) SOLN Use as directed 1 each 2   Blood Glucose Monitoring Suppl (ACCU-CHEK AVIVA PLUS) w/Device KIT Use to check blood sugar 3 times daily 1 kit 1   clonazePAM (KLONOPIN) 1 MG tablet Take 1 tablet (1 mg total) by mouth 2 (two) times daily as needed for anxiety. 60 tablet 3   Dulaglutide (TRULICITY) 1.5 IF/0.2DX SOPN Inject 1.5 mg into the skin once a week. 13 mL 3   ezetimibe (ZETIA) 10 MG tablet Take 1 tablet  (10 mg total) by mouth daily. 30 tablet 3   gabapentin (NEURONTIN) 600 MG tablet Take 1 tablet (600 mg total) by mouth in the morning, at noon, in the evening, and at bedtime. Cancel order at [facility] on [address] 120 tablet 3   glucose blood (ACCU-CHEK AVIVA) test strip Use to test blood sugar 3 times daily. Dx: 11.8 100 each 12   hydrochlorothiazide (HYDRODIURIL) 25 MG tablet TAKE 1 TABLET BY MOUTH EVERY DAY 90 tablet 3   HYDROcodone-acetaminophen (NORCO) 7.5-325 MG tablet Take 1 tablet by mouth every 6 (six) hours as needed for moderate pain. 120 tablet 0   ipratropium (ATROVENT) 0.06 % nasal spray Place 2 sprays into both nostrils 4 (four) times daily. 15 mL 3   Magnesium 250 MG TABS Take 250 mg by mouth daily.     metoprolol succinate (TOPROL XL) 25 MG 24 hr tablet Take 0.5 tablets (12.5 mg total) by mouth at bedtime. 45 tablet 3   omeprazole (PRILOSEC) 40 MG capsule Take 1 capsule (40 mg total) by mouth daily. 90 capsule 2   ondansetron (ZOFRAN-ODT) 4 MG disintegrating tablet Take 4 mg by mouth daily as needed for nausea/vomiting.     potassium chloride SA (KLOR-CON M) 20 MEQ tablet Take 1 tablet (20 mEq total) by mouth daily. 30 tablet 3   QUEtiapine (SEROQUEL) 100 MG tablet Take 0.5 tablets (50 mg total) by mouth at bedtime. 45 tablet 3   triamcinolone (KENALOG) 0.1 % paste Use as directed 1 application in the mouth or throat 2 (two) times daily. (Patient taking differently: Use as directed 1 application in the mouth or throat daily as needed (gums).) 5 g 1   No current facility-administered medications on file prior to visit.   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[place])    Anxiety    Arthritis    Diabetes mellitus without complication (HCC)    GERD (gastroesophageal reflux disease)    Heart murmur    Hyperlipidemia    Hypertension    Neuromuscular disorder (HCC)    Palpitations    Pneumonia    PONV (postoperative nausea and vomiting)    PTSD (post-traumatic  stress disorder)    Ringing of ears, left    Allergies  Allergen Reactions   Hydromorphone Nausea And Vomiting and Other (See Comments)    Made the patient feel "spaced out,"     Morphine And Related Itching    With high doses   Other Itching   Penicillins Rash    Has patient had a PCN reaction causing immediate rash, facial/tongue/throat swelling, SOB or lightheadedness with hypotension: Yes Has patient had a PCN reaction causing severe rash involving mucus membranes or skin necrosis: No Has patient had a PCN reaction that required hospitalization: No Has patient had a PCN reaction occurring within the last 10 years: No If all of the above answers are "NO", then may proceed with Cephalosporin use.     Social  History   Socioeconomic History   Marital status: Divorced    Spouse name: Not on file   Number of children: Not on file   Years of education: Not on file   Highest education level: Not on file  Occupational History   Not on file  Tobacco Use   Smoking status: Never   Smokeless tobacco: Never  Vaping Use   Vaping Use: Never used  Substance and Sexual Activity   Alcohol use: No   Drug use: No   Sexual activity: Not on file  Other Topics Concern   Not on file  Social History Narrative   Not on file   Social Determinants of Health   Financial Resource Strain: Low Risk    Difficulty of Paying Living Expenses: Not hard at all  Food Insecurity: No Food Insecurity   Worried About Running Out of Food in the Last Year: Never true   [place] in the Last Year: Never true  Transportation Needs: No Transportation Needs   Lack of Transportation (Medical): No   Lack of Transportation (Non-Medical): No  Physical Activity: Insufficiently Active   Days of Exercise per Week: 2 days   Minutes of Exercise per Session: 40 min  Stress: No Stress Concern Present   Feeling of Stress : Not at all  Social Connections: Moderately Integrated   Frequency of Communication  with Friends and Family: Twice a week   Frequency of Social Gatherings with Friends and Family: Twice a week   Attends Religious Services: More than 4 times per year   Active Member of [employer] or Organizations: Yes   Attends Archivist Meetings: 1 to 4 times per year   Marital Status: Widowed   Vitals:   04/23/21 1422  BP: 110/70  Pulse: 63  Resp: 16  SpO2: 97%   Body mass index is 27.37 kg/m.  Physical Exam Vitals and nursing note reviewed.  Constitutional:      General: She is not in acute distress.    Appearance: She is well-developed.  HENT:     Head: Normocephalic and atraumatic.     Mouth/Throat:     Mouth: Mucous membranes are moist.     Pharynx: Oropharynx is clear.     Comments: Post nasal drainage. Eyes:     Conjunctiva/sclera: Conjunctivae normal.  Cardiovascular:     Rate and Rhythm: Normal rate. Rhythm irregular.     Heart sounds: No murmur heard.    Comments: DP pulses present. Pulmonary:     Effort: Pulmonary effort is normal. No respiratory distress.     Breath sounds: Normal breath sounds.  Abdominal:     Palpations: Abdomen is soft. There is no hepatomegaly or mass.     Tenderness: There is no abdominal tenderness.  Musculoskeletal:     Comments: Lower back pain elicited with movement on examination table.  Lymphadenopathy:     Cervical: No cervical adenopathy.  Skin:    General: Skin is warm.     Findings: No erythema or rash.  Neurological:     General: No focal deficit present.     Mental Status: She is alert and oriented to person, place, and time.     Cranial Nerves: No cranial nerve deficit.     Gait: Gait normal.  Psychiatric:        Mood and Affect: Mood is anxious.     Comments: Well groomed, good eye contact.   ASSESSMENT AND PLAN:  [name] was seen today  for crackling in chest.  Diagnoses and all orders for this visit: Orders Placed This Encounter  Procedures   DG Chest 2 View   POC HgB A1c   Lab Results   Component Value Date   HGBA1C 6.2 04/23/2021   Wheezing Lung auscultation negative today. We discussed possible etiologies. Post nasal drainage and position during sleep could contribute to upper chest noise when in bed. Also GERD. Monitor for new symptoms. Instructed about warning signs. Use Albuterol inh as needed for symptoms. CXR ordered.  Type 2 diabetes mellitus with neurological complications (HCC) PCH4K at goal. Continue Trulicity 1.5 mg weekly. Annual eye exam, periodic dental and foot care to continue. F/U in 5-6 months   Essential hypertension BP adequately controlled. Continue Metoprolol succinate 25 mg daily and HCTZ 25 mg daily. Low salt diet to continue.  Polyneuropathy associated with underlying disease ([place]) Improved with higher dose of Gabapentin. We discussed the importance of appropriate foot care. Following with neurologist.  Atherosclerosis of aorta ([place]) She has not tolerated statins. Continue Zetia 10 mg daily and Aspirin 81 mg daily.  I spent a total of 40 minutes in both face to face and non face to face activities for this visit on the date of this encounter. During this time history was obtained and documented, examination was performed, prior labs/imaging reviewed, and assessment/plan discussed.  Return in about 4 months (around 08/21/2021).  Bree Heinzelman G. Martin[place], MD  [facility]. [place] office.

## 2021-04-23 ENCOUNTER — Encounter: Payer: Self-pay | Admitting: Family Medicine

## 2021-04-23 ENCOUNTER — Ambulatory Visit (INDEPENDENT_AMBULATORY_CARE_PROVIDER_SITE_OTHER): Payer: Medicare Other

## 2021-04-23 ENCOUNTER — Other Ambulatory Visit: Payer: Self-pay

## 2021-04-23 ENCOUNTER — Ambulatory Visit (INDEPENDENT_AMBULATORY_CARE_PROVIDER_SITE_OTHER): Payer: Medicare Other | Admitting: Family Medicine

## 2021-04-23 VITALS — BP 110/70 | HR 63 | Resp 16 | Ht 68.0 in | Wt 180.0 lb

## 2021-04-23 DIAGNOSIS — I700 Atherosclerosis of aorta: Secondary | ICD-10-CM

## 2021-04-23 DIAGNOSIS — R062 Wheezing: Secondary | ICD-10-CM

## 2021-04-23 DIAGNOSIS — I10 Essential (primary) hypertension: Secondary | ICD-10-CM | POA: Diagnosis not present

## 2021-04-23 DIAGNOSIS — G63 Polyneuropathy in diseases classified elsewhere: Secondary | ICD-10-CM

## 2021-04-23 DIAGNOSIS — E1149 Type 2 diabetes mellitus with other diabetic neurological complication: Secondary | ICD-10-CM

## 2021-04-23 DIAGNOSIS — J849 Interstitial pulmonary disease, unspecified: Secondary | ICD-10-CM | POA: Diagnosis not present

## 2021-04-23 DIAGNOSIS — M47814 Spondylosis without myelopathy or radiculopathy, thoracic region: Secondary | ICD-10-CM | POA: Diagnosis not present

## 2021-04-23 DIAGNOSIS — R0689 Other abnormalities of breathing: Secondary | ICD-10-CM | POA: Diagnosis not present

## 2021-04-23 LAB — POCT GLYCOSYLATED HEMOGLOBIN (HGB A1C): HbA1c, POC (prediabetic range): 6.2 % (ref 5.7–6.4)

## 2021-04-23 NOTE — Assessment & Plan Note (Signed)
She has not tolerated statins. Continue Zetia 10 mg daily and Aspirin 81 mg daily.

## 2021-04-23 NOTE — Assessment & Plan Note (Signed)
Improved with higher dose of Gabapentin. We discussed the importance of appropriate foot care. Following with neurologist.

## 2021-04-23 NOTE — Assessment & Plan Note (Signed)
BP adequately controlled. Continue Metoprolol succinate 25 mg daily and HCTZ 25 mg daily. Low salt diet to continue.

## 2021-04-23 NOTE — Assessment & Plan Note (Signed)
HgA1C at goal. Continue Trulicity 1.5 mg weekly. Annual eye exam, periodic dental and foot care to continue. F/U in 5-6 months

## 2021-04-23 NOTE — Patient Instructions (Addendum)
A few things to remember from today's visit:  Wheezing - Plan: DG Chest 2 View  Type 2 diabetes mellitus with neurological complications [facility])  Essential hypertension  If you need refills please call your pharmacy. Do not use My Chart to request refills or for acute issues that need immediate attention.   No changes today.  Please be sure medication list is accurate. If a new problem present, please set up appointment sooner than planned today.

## 2021-04-25 ENCOUNTER — Telehealth: Payer: Self-pay | Admitting: *Deleted

## 2021-04-25 NOTE — Telephone Encounter (Signed)
Oral swab drug screen was consistent for prescribed medications.  ?

## 2021-05-09 DIAGNOSIS — I739 Peripheral vascular disease, unspecified: Secondary | ICD-10-CM | POA: Diagnosis not present

## 2021-05-09 DIAGNOSIS — E1140 Type 2 diabetes mellitus with diabetic neuropathy, unspecified: Secondary | ICD-10-CM | POA: Diagnosis not present

## 2021-05-09 DIAGNOSIS — L110 Acquired keratosis follicularis: Secondary | ICD-10-CM | POA: Diagnosis not present

## 2021-05-09 DIAGNOSIS — M79671 Pain in right foot: Secondary | ICD-10-CM | POA: Diagnosis not present

## 2021-05-13 ENCOUNTER — Telehealth: Payer: Self-pay | Admitting: *Deleted

## 2021-05-13 ENCOUNTER — Telehealth: Payer: Self-pay

## 2021-05-13 NOTE — Telephone Encounter (Signed)
approved Effective from 05/13/2021 through 05/13/2022.

## 2021-05-13 NOTE — Telephone Encounter (Signed)
Prior auth submitted to [employer] via CoverMyMeds for hydrocodone 7.5/325 #[id number].

## 2021-05-13 NOTE — Telephone Encounter (Signed)
Message from pharmacy "doctor has to send Rx to [facility] at [place], [place] because the system is gibing me a rejection that pt from different region.

## 2021-05-14 ENCOUNTER — Ambulatory Visit (INDEPENDENT_AMBULATORY_CARE_PROVIDER_SITE_OTHER): Payer: Medicare Other | Admitting: Family Medicine

## 2021-05-14 ENCOUNTER — Encounter: Payer: Self-pay | Admitting: Family Medicine

## 2021-05-14 VITALS — BP 130/70 | HR 88 | Resp 16 | Ht 68.0 in | Wt 185.0 lb

## 2021-05-14 DIAGNOSIS — F3341 Major depressive disorder, recurrent, in partial remission: Secondary | ICD-10-CM | POA: Diagnosis not present

## 2021-05-14 DIAGNOSIS — J301 Allergic rhinitis due to pollen: Secondary | ICD-10-CM

## 2021-05-14 DIAGNOSIS — R059 Cough, unspecified: Secondary | ICD-10-CM

## 2021-05-14 LAB — POC COVID19 BINAXNOW: SARS Coronavirus 2 Ag: NEGATIVE

## 2021-05-14 NOTE — Patient Instructions (Signed)
A few things to remember from today's visit:  Cough, unspecified type - Plan: POC COVID-19  Allergic rhinitis due to pollen, unspecified seasonality  If you need refills please call your pharmacy. Do not use My Chart to request refills or for acute issues that need immediate attention.   Examination today does not suggest a serious infectious process. Continue symptomatic treatment. Nasal saline irrigations. Cool mist humidifier. Vick vapor rub on chest and tips of nose. Monitor for fever.  Please be sure medication list is accurate. If a new problem present, please set up appointment sooner than planned today.

## 2021-05-14 NOTE — Progress Notes (Signed)
ACUTE VISIT Chief Complaint  Patient presents with   head congestion   Cough   HPI: [name] is a 71 y.o. female, who is here today complaining of 2 days of respiratory symptoms. Sunday she started with nasal congestion, constant. She has not identified alleviating factors. She is concerned about infectious process. Productive cough last night, improved after coughing up yellowish  sputum, thick. She has not had cough today.  Cough This is a new problem. The current episode started yesterday. The problem has been rapidly improving. The cough is Productive of sputum. Associated symptoms include nasal congestion and a sore throat. Pertinent negatives include no chest pain, chills, ear congestion, ear pain, fever, headaches, heartburn, hemoptysis, myalgias, postnasal drip, rash, rhinorrhea, shortness of breath, sweats, weight loss or wheezing. Nothing aggravates the symptoms. She has tried a beta-agonist inhaler for the symptoms. Her past medical history is significant for environmental allergies. There is no history of COPD or emphysema.   Sore throat has resolved after using chloraseptic. She does not feel bad. Nasal saline irrigations and Atrovent nasal spray have not helped. Flonase nasal spray caused nose bleed in the past.  Co-wokers and church members have been sick .  COVID negative. She has used Albuterol inh x 2 because cough and it has helped.  She has not made a decision about when to have CABG. She was initially planning on staying with her son while recovering from surgery but yesterday she realized her son will not be able to help with toilet,showering,and dressing. She does not want to go to a SNF. States that she cannot afford a lifting/electric recliner, which was recommended to use during recovery.  Depression: She is on Seroquel 100 mg 1/2 tab at bedtime, it helps her with sleep.  Review of Systems  Constitutional:  Negative for chills, fever and  weight loss.  HENT:  Positive for sore throat. Negative for ear pain, postnasal drip and rhinorrhea.   Respiratory:  Positive for cough. Negative for hemoptysis, shortness of breath and wheezing.   Cardiovascular:  Negative for chest pain, palpitations and leg swelling.  Gastrointestinal:  Negative for heartburn.  Musculoskeletal:  Negative for myalgias.  Skin:  Negative for rash.  Allergic/Immunologic: Positive for environmental allergies.  Neurological:  Negative for syncope and headaches.  Hematological:  Negative for adenopathy. Does not bruise/bleed easily.  Psychiatric/Behavioral:  Negative for confusion. The patient is nervous/anxious.   Rest see pertinent positives and negatives per HPI.  Current Outpatient Medications on File Prior to Visit  Medication Sig Dispense Refill   ACCU-CHEK SOFTCLIX LANCETS lancets Use to test blood sugar 3 times daily. Dx: E11.8 100 each 12   albuterol (VENTOLIN HFA) 108 (90 Base) MCG/ACT inhaler INHALE 2 PUFFS INTO THE LUNGS EVERY 6 HOURS AS NEEDED FOR WHEEZING OR SHORTNESS OF BREATH 18 g 2   Alcohol Swabs (B-D SINGLE USE SWABS REGULAR) PADS Use to test blood sugar 3 times daily 100 each 3   aspirin EC 81 MG tablet Take 81 mg by mouth daily. Swallow whole.     Blood Glucose Calibration (ACCU-CHEK AVIVA) SOLN Use as directed 1 each 2   Blood Glucose Monitoring Suppl (ACCU-CHEK AVIVA PLUS) w/Device KIT Use to check blood sugar 3 times daily 1 kit 1   clonazePAM (KLONOPIN) 1 MG tablet Take 1 tablet (1 mg total) by mouth 2 (two) times daily as needed for anxiety. 60 tablet 3   Dulaglutide (TRULICITY) 1.5 QQ/7.6PP SOPN Inject 1.5 mg into the  skin once a week. 13 mL 3   gabapentin (NEURONTIN) 600 MG tablet Take 1 tablet (600 mg total) by mouth in the morning, at noon, in the evening, and at bedtime. Cancel order at [facility] on [address] 120 tablet 3   glucose blood (ACCU-CHEK AVIVA) test strip Use to test blood sugar 3 times daily. Dx: 11.8 100 each 12    hydrochlorothiazide (HYDRODIURIL) 25 MG tablet TAKE 1 TABLET BY MOUTH EVERY DAY 90 tablet 3   HYDROcodone-acetaminophen (NORCO) 7.5-325 MG tablet Take 1 tablet by mouth every 6 (six) hours as needed for moderate pain. 120 tablet 0   ipratropium (ATROVENT) 0.06 % nasal spray Place 2 sprays into both nostrils 4 (four) times daily. 15 mL 3   Magnesium 250 MG TABS Take 250 mg by mouth daily.     metoprolol succinate (TOPROL XL) 25 MG 24 hr tablet Take 0.5 tablets (12.5 mg total) by mouth at bedtime. 45 tablet 3   omeprazole (PRILOSEC) 40 MG capsule Take 1 capsule (40 mg total) by mouth daily. 90 capsule 2   ondansetron (ZOFRAN-ODT) 4 MG disintegrating tablet Take 4 mg by mouth daily as needed for nausea/vomiting.     potassium chloride SA (KLOR-CON M) 20 MEQ tablet Take 1 tablet (20 mEq total) by mouth daily. 30 tablet 3   QUEtiapine (SEROQUEL) 100 MG tablet Take 0.5 tablets (50 mg total) by mouth at bedtime. 45 tablet 3   triamcinolone (KENALOG) 0.1 % paste Use as directed 1 application in the mouth or throat 2 (two) times daily. (Patient taking differently: Use as directed 1 application in the mouth or throat daily as needed (gums).) 5 g 1   ezetimibe (ZETIA) 10 MG tablet Take 1 tablet (10 mg total) by mouth daily. 30 tablet 3   No current facility-administered medications on file prior to visit.     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[place])    Anxiety    Arthritis    Diabetes mellitus without complication (HCC)    GERD (gastroesophageal reflux disease)    Heart murmur    Hyperlipidemia    Hypertension    Neuromuscular disorder (HCC)    Palpitations    Pneumonia    PONV (postoperative nausea and vomiting)    PTSD (post-traumatic stress disorder)    Ringing of ears, left    Allergies  Allergen Reactions   Hydromorphone Nausea And Vomiting and Other (See Comments)    Made the patient feel "spaced out,"     Morphine And Related Itching    With high doses    Other Itching   Penicillins Rash    Has patient had a PCN reaction causing immediate rash, facial/tongue/throat swelling, SOB or lightheadedness with hypotension: Yes Has patient had a PCN reaction causing severe rash involving mucus membranes or skin necrosis: No Has patient had a PCN reaction that required hospitalization: No Has patient had a PCN reaction occurring within the last 10 years: No If all of the above answers are "NO", then may proceed with Cephalosporin use.     Social History   Socioeconomic History   Marital status: Divorced    Spouse name: Not on file   Number of children: Not on file   Years of education: Not on file   Highest education level: Not on file  Occupational History   Not on file  Tobacco Use   Smoking status: Never   Smokeless tobacco: Never  Vaping Use   Vaping  Use: Never used  Substance and Sexual Activity   Alcohol use: No   Drug use: No   Sexual activity: Not on file  Other Topics Concern   Not on file  Social History Narrative   Not on file   Social Determinants of Health   Financial Resource Strain: Low Risk    Difficulty of Paying Living Expenses: Not hard at all  Food Insecurity: No Food Insecurity   Worried About Charity fundraiser in the Last Year: Never true   [place] in the Last Year: Never true  Transportation Needs: No Transportation Needs   Lack of Transportation (Medical): No   Lack of Transportation (Non-Medical): No  Physical Activity: Insufficiently Active   Days of Exercise per Week: 2 days   Minutes of Exercise per Session: 40 min  Stress: No Stress Concern Present   Feeling of Stress : Not at all  Social Connections: Moderately Integrated   Frequency of Communication with Friends and Family: Twice a week   Frequency of Social Gatherings with Friends and Family: Twice a week   Attends Religious Services: More than 4 times per year   Active Member of [employer] or Organizations: Yes   Attends Theatre manager Meetings: 1 to 4 times per year   Marital Status: Widowed    Vitals:   05/14/21 1511  BP: 130/70  Pulse: 88  Resp: 16  SpO2: 97%   Body mass index is 28.13 kg/m.  Physical Exam Vitals and nursing note reviewed.  Constitutional:      General: She is not in acute distress.    Appearance: She is well-developed. She is not ill-appearing.  HENT:     Head: Normocephalic and atraumatic.     Right Ear: Tympanic membrane, ear canal and external ear normal.     Left Ear: Tympanic membrane, ear canal and external ear normal.     Nose: Congestion and rhinorrhea present.     Right Turbinates: Enlarged.     Left Turbinates: Enlarged.     Mouth/Throat:     Mouth: Mucous membranes are moist.     Pharynx: Oropharynx is clear.  Eyes:     Conjunctiva/sclera: Conjunctivae normal.  Cardiovascular:     Rate and Rhythm: Normal rate. Rhythm irregular.     Heart sounds: No murmur heard. Pulmonary:     Effort: Pulmonary effort is normal. No respiratory distress.     Breath sounds: Normal breath sounds. No stridor.  Lymphadenopathy:     Cervical: No cervical adenopathy.  Skin:    General: Skin is warm.     Findings: No erythema or rash.  Neurological:     Mental Status: She is alert and oriented to person, place, and time.  Psychiatric:        Mood and Affect: Mood is anxious. Affect is labile.     Comments: Well groomed, good eye contact.   ASSESSMENT AND PLAN:  Ms.Annick was seen today for head congestion and cough.  Diagnoses and all orders for this visit: Orders Placed This Encounter  Procedures   POC COVID-19   Cough, unspecified type Improved. Lung auscultation negative, hx does not suggest a serious process. I do not think we need imaging today. Instructed about warning signs.  Rapid COVID 19 test negative.  Allergic rhinitis due to pollen, unspecified seasonality We discussed differential dx. Neti pot may help. Continue Atrovent nasal spray. Monitor for  new symptoms.  Depression, major, recurrent, in partial remission ([place])  Stable. Continue current management.  I spent a total of 32 minutes in both face to face and non face to face activities for this visit on the date of this encounter. During this time history was obtained and documented, examination was performed, and assessment/plan discussed.  Return if symptoms worsen or fail to improve, for Keep next appt..  Jaloni Sorber G. [place], MD  [facility]. [place] office.

## 2021-05-16 NOTE — Telephone Encounter (Signed)
Placed a call to [place] regarding the fax;  Placed a call to Ms. Harvill regarding the above, she verbalizes understanding.

## 2021-05-17 ENCOUNTER — Encounter: Payer: Self-pay | Admitting: Family Medicine

## 2021-05-28 ENCOUNTER — Encounter: Payer: Self-pay | Admitting: Family Medicine

## 2021-05-28 DIAGNOSIS — G4700 Insomnia, unspecified: Secondary | ICD-10-CM

## 2021-05-28 DIAGNOSIS — F3341 Major depressive disorder, recurrent, in partial remission: Secondary | ICD-10-CM

## 2021-05-29 MED ORDER — QUETIAPINE FUMARATE 100 MG PO TABS: 50.0000 mg | ORAL_TABLET | Freq: Every day | ORAL | 3 refills | Status: DC

## 2021-06-03 ENCOUNTER — Encounter: Payer: Self-pay | Admitting: Family Medicine

## 2021-06-03 NOTE — Telephone Encounter (Signed)
Okay to send in? I don't see it on her medication list anymore.

## 2021-06-04 ENCOUNTER — Other Ambulatory Visit: Payer: Self-pay | Admitting: Family Medicine

## 2021-06-04 MED ORDER — FLUTICASONE PROPIONATE 50 MCG/ACT NA SUSP: 2.0000 | Freq: Every day | NASAL | 3 refills | Status: DC | PRN

## 2021-06-10 ENCOUNTER — Other Ambulatory Visit: Payer: Self-pay

## 2021-06-10 ENCOUNTER — Encounter: Payer: Medicare Other | Attending: Physical Medicine & Rehabilitation | Admitting: Registered Nurse

## 2021-06-10 ENCOUNTER — Encounter: Payer: Self-pay | Admitting: Registered Nurse

## 2021-06-10 VITALS — BP 148/83 | HR 78 | Ht 68.0 in | Wt 183.0 lb

## 2021-06-10 DIAGNOSIS — M5416 Radiculopathy, lumbar region: Secondary | ICD-10-CM | POA: Insufficient documentation

## 2021-06-10 DIAGNOSIS — E1142 Type 2 diabetes mellitus with diabetic polyneuropathy: Secondary | ICD-10-CM | POA: Diagnosis not present

## 2021-06-10 DIAGNOSIS — M542 Cervicalgia: Secondary | ICD-10-CM | POA: Insufficient documentation

## 2021-06-10 DIAGNOSIS — M5412 Radiculopathy, cervical region: Secondary | ICD-10-CM | POA: Insufficient documentation

## 2021-06-10 DIAGNOSIS — G894 Chronic pain syndrome: Secondary | ICD-10-CM | POA: Diagnosis not present

## 2021-06-10 DIAGNOSIS — M47812 Spondylosis without myelopathy or radiculopathy, cervical region: Secondary | ICD-10-CM | POA: Insufficient documentation

## 2021-06-10 DIAGNOSIS — Z79891 Long term (current) use of opiate analgesic: Secondary | ICD-10-CM | POA: Diagnosis not present

## 2021-06-10 DIAGNOSIS — Z5181 Encounter for therapeutic drug level monitoring: Secondary | ICD-10-CM | POA: Diagnosis not present

## 2021-06-10 DIAGNOSIS — M2550 Pain in unspecified joint: Secondary | ICD-10-CM | POA: Diagnosis not present

## 2021-06-10 MED ORDER — HYDROCODONE-ACETAMINOPHEN 7.5-325 MG PO TABS: 1.0000 | ORAL_TABLET | Freq: Four times a day (QID) | ORAL | 0 refills | Status: DC | PRN

## 2021-06-10 MED ORDER — GABAPENTIN 600 MG PO TABS: 600.0000 mg | ORAL_TABLET | Freq: Four times a day (QID) | ORAL | 3 refills | Status: DC

## 2021-06-10 NOTE — Progress Notes (Addendum)
Subjective:    Patient ID: [name], female    DOB: 05/09/1950, 71 y.o.   MRN: [id number]  HPI: [name] is a 71 y.o. female who returns for follow up appointment for chronic pain and medication refill. She states her  pain is located in her neck radiating into her left shoulder, lower back pain radiating into her left lower extremity. Also reports tingling and burning in her left fingers occasionally and bilateral feet with tingling and burning and generalized joint pain. She rates her pain 7. Her current exercise regime is walking and performing stretching exercises.  Ms. Laboy Morphine equivalent is 30.00  MME.  She is also prescribed Clonazepam  by Dr. Martinique .We have discussed the black box warning of using opioids and benzodiazepines. I highlighted the dangers of using these drugs together and discussed the adverse events including respiratory suppression, overdose, cognitive impairment and importance of compliance with current regimen. We will continue to monitor and adjust as indicated.   Last Oral Swab was Performed on 04/11/2021, it was consistent.      Pain Inventory Average Pain 9 Pain Right Now 7 My pain is constant, burning, and aching  In the last 24 hours, has pain interfered with the following? General activity 7 Relation with others 7 Enjoyment of life 7 What TIME of day is your pain at its worst? morning , daytime, evening, and night Sleep (in general) Fair  Pain is worse with: walking, bending, sitting, inactivity, standing, and some activites Pain improves with: rest, heat/ice, therapy/exercise, pacing activities, medication, TENS, and injections Relief from Meds: 10  Family History  Problem Relation Age of Onset   Diabetes Mother    Alzheimer's disease Mother    Lung cancer Father    Esophageal cancer Brother    Colon cancer Neg Hx    Social History   Socioeconomic History   Marital status: Divorced    Spouse name: Not on file    Number of children: Not on file   Years of education: Not on file   Highest education level: Not on file  Occupational History   Not on file  Tobacco Use   Smoking status: Never   Smokeless tobacco: Never  Vaping Use   Vaping Use: Never used  Substance and Sexual Activity   Alcohol use: No   Drug use: No   Sexual activity: Not on file  Other Topics Concern   Not on file  Social History Narrative   Not on file   Social Determinants of Health   Financial Resource Strain: Low Risk    Difficulty of Paying Living Expenses: Not hard at all  Food Insecurity: No Food Insecurity   Worried About Charity fundraiser in the Last Year: Never true   Ran Out of Food in the Last Year: Never true  Transportation Needs: No Transportation Needs   Lack of Transportation (Medical): No   Lack of Transportation (Non-Medical): No  Physical Activity: Insufficiently Active   Days of Exercise per Week: 2 days   Minutes of Exercise per Session: 40 min  Stress: No Stress Concern Present   Feeling of Stress : Not at all  Social Connections: Moderately Integrated   Frequency of Communication with Friends and Family: Twice a week   Frequency of Social Gatherings with Friends and Family: Twice a week   Attends Religious Services: More than 4 times per year   Active Member of [employer] or Organizations: Yes   Attends [employer]  or Organization Meetings: 1 to 4 times per year   Marital Status: Widowed   Past Surgical History:  Procedure Laterality Date   ABDOMINAL HYSTERECTOMY     partial   ACHILLES TENDON SURGERY Left    APPENDECTOMY     BACK SURGERY     BLADDER SUSPENSION     BREAST SURGERY Right 2003   milk duct removed   CHOLECYSTECTOMY     FRACTURE SURGERY     collar bone   LEFT HEART CATH AND CORONARY ANGIOGRAPHY N/A 03/08/2021   Procedure: LEFT HEART CATH AND CORONARY ANGIOGRAPHY;  Surgeon: Martinique, Peter M, MD;  Location: [facility];  Service: Cardiovascular;  Laterality: N/A;   NOSE  SURGERY     x 2    OTHER SURGICAL HISTORY     Breast Duct removed   PARTIAL HYSTERECTOMY     RECTOCELE REPAIR     TONSILLECTOMY     VASCULAR SURGERY Right    right leg   VEIN SURGERY Right    leg   Past Surgical History:  Procedure Laterality Date   ABDOMINAL HYSTERECTOMY     partial   ACHILLES TENDON SURGERY Left    APPENDECTOMY     BACK SURGERY     BLADDER SUSPENSION     BREAST SURGERY Right 2003   milk duct removed   CHOLECYSTECTOMY     FRACTURE SURGERY     collar bone   LEFT HEART CATH AND CORONARY ANGIOGRAPHY N/A 03/08/2021   Procedure: LEFT HEART CATH AND CORONARY ANGIOGRAPHY;  Surgeon: Martinique, Peter M, MD;  Location: [facility];  Service: Cardiovascular;  Laterality: N/A;   NOSE SURGERY     x 2    OTHER SURGICAL HISTORY     Breast Duct removed   PARTIAL HYSTERECTOMY     RECTOCELE REPAIR     TONSILLECTOMY     VASCULAR SURGERY Right    right leg   VEIN SURGERY Right    leg   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HCC)    Anxiety    Arthritis    Diabetes mellitus without complication (HCC)    GERD (gastroesophageal reflux disease)    Heart murmur    Hyperlipidemia    Hypertension    Neuromuscular disorder (HCC)    Palpitations    Pneumonia    PONV (postoperative nausea and vomiting)    PTSD (post-traumatic stress disorder)    Ringing of ears, left    BP (!) 148/83    Pulse 78    Ht '5\' 8"'$  (1.727 m)    Wt 183 lb (83 kg)    SpO2 96%    BMI 27.83 kg/m   Opioid Risk Score:   Fall Risk Score:  `1  Depression screen PHQ 2/9  Depression screen [facility] 2/9 06/10/2021 05/14/2021 04/23/2021 04/11/2021 12/06/2020 11/06/2020 11/06/2020  Decreased Interest 0 0 1 0 1 0 0  Down, Depressed, Hopeless '3 2 1 1 1 '$ 0 0  PHQ - 2 Score '3 2 2 1 2 '$ 0 0  Altered sleeping - 2 1 - - - -  Tired, decreased energy - 1 1 - - - -  Change in appetite - 2 1 - - - -  Feeling bad or failure about yourself  - 1 0 - - - -  Trouble concentrating - 1 0 - - - -  Moving  slowly or fidgety/restless - 1 0 - - - -  Suicidal thoughts - 0 0 - - - -  PHQ-9 Score - 10 5 - - - -  Difficult doing work/chores - Somewhat difficult Somewhat difficult - - - -  Some recent data might be hidden     Review of Systems  Constitutional: Negative.   HENT: Negative.    Eyes: Negative.   Respiratory: Negative.    Cardiovascular: Negative.   Gastrointestinal: Negative.   Endocrine: Negative.   Genitourinary: Negative.   Musculoskeletal:  Positive for back pain.  Skin: Negative.   Allergic/Immunologic: Negative.   Neurological: Negative.   Hematological: Negative.   Psychiatric/Behavioral: Negative.        Objective:   Physical Exam Vitals and nursing note reviewed.  Constitutional:      Appearance: Normal appearance.  Cardiovascular:     Rate and Rhythm: Normal rate and regular rhythm.     Pulses: Normal pulses.     Heart sounds: Normal heart sounds.  Pulmonary:     Effort: Pulmonary effort is normal.     Breath sounds: Normal breath sounds.  Musculoskeletal:     Cervical back: Normal range of motion and neck supple.     Comments: Normal Muscle Bulk and Muscle Testing Reveals:  Upper Extremities: Right: Full  ROM and Muscle Strength 5/5 Left Upper Extremity: Decreased ROM 90 Degrees and Muscle Strength 4/5 Thoracic Paraspinal Tenderness: T-1-T-4 Mainly Left Side Lumbar Paraspinal Tenderness: L-4-L-5 Left Greater trochanter Tenderness Lower Extremities: Full ROM and Muscle Strength 5/5 Arises from Chair with ease Narrow Based Gait     Skin:    General: Skin is warm and dry.  Neurological:     Mental Status: She is alert and oriented to person, place, and time.  Psychiatric:        Mood and Affect: Mood normal.        Behavior: Behavior normal.         Assessment & Plan:  1.Chronic cervicalgia with documented spondylosis on MRI, per Dr. Naaman Plummer Note and Cervical Radiculitis: Continue Gabapentin,  Continue HEP as Tolerated. 06/10/2021 2. Left  shoulder pain most consistent with left rotator cuff syndrome. Mild DJD.  Continue HEP as tolerated.Continue to monitor. 06/10/2021. Refilled: Hydrocodone 7.5/325 mg one tablet every 6 hours as needed for pain. #120. Second script sent for the following month. We will continue the opioid monitoring program, this consists of regular clinic visits, examinations, urine drug screen, pill counts as well as use of [place] Controlled Substance Reporting system. A 12 month History has been reviewed on the [place] on 06/10/2021. 3. Left wrist/finger pain most consistent with OA, ?post traumatic. No complaints today. 06/10/2021 4. Anxiety: Continue Klonopin, PCP prescribing. Continue to monitor. 06/10/2021 5. Restless Leg syndrome: Continue Gabapentin. 06/10/2021 7. Sacroiliac inflammation: S/P Right  SI injection on 09/20/2019, with No relief noted. Ms. Benito seen Dr Marlou Sa in regards to her pain. Dr Saintclair Halsted. Following.  Continue to Monitor. 06/10/2021. 8. Myofascial Pain: Continue Robaxin as needed. 06/10/2021.  9. Bilateral feet with neuropathic pain: Increase Gabapentin 600 mg 4 times a day. Continue to monitor. 06/10/2021. 10.  Herniated Nucleus Pulposus L2-3 with recurrent disc rupture: S/P  Posterior Lumbar Interbody Fusion  - Lumbar two-Lumbar three on 01/13/2020 by Dr Saintclair Halsted.  Dr Saintclair Halsted Following. 06/10/2021   F/U in 2 months

## 2021-07-01 ENCOUNTER — Encounter: Payer: Self-pay | Admitting: Family Medicine

## 2021-07-01 DIAGNOSIS — K120 Recurrent oral aphthae: Secondary | ICD-10-CM

## 2021-07-02 MED ORDER — TRIAMCINOLONE ACETONIDE 0.1 % MT PSTE: 1.0000 "application " | PASTE | Freq: Two times a day (BID) | OROMUCOSAL | 1 refills | Status: DC

## 2021-07-08 DIAGNOSIS — M5416 Radiculopathy, lumbar region: Secondary | ICD-10-CM | POA: Diagnosis not present

## 2021-07-09 ENCOUNTER — Encounter: Payer: Self-pay | Admitting: Family Medicine

## 2021-07-09 ENCOUNTER — Encounter: Payer: Self-pay | Admitting: Internal Medicine

## 2021-07-09 ENCOUNTER — Other Ambulatory Visit: Payer: Self-pay | Admitting: Family Medicine

## 2021-07-09 DIAGNOSIS — F32A Depression, unspecified: Secondary | ICD-10-CM

## 2021-07-09 NOTE — Telephone Encounter (Signed)
-   last filled 06/07/21 ?- last office visit 05/14/21 ?

## 2021-07-12 ENCOUNTER — Ambulatory Visit: Payer: Medicare Other | Admitting: Internal Medicine

## 2021-07-15 ENCOUNTER — Encounter: Payer: Self-pay | Admitting: Cardiothoracic Surgery

## 2021-07-15 ENCOUNTER — Institutional Professional Consult (permissible substitution) (INDEPENDENT_AMBULATORY_CARE_PROVIDER_SITE_OTHER): Payer: Medicare Other | Admitting: Cardiothoracic Surgery

## 2021-07-15 VITALS — BP 116/74 | HR 89 | Resp 20 | Ht 68.0 in | Wt 181.2 lb

## 2021-07-15 DIAGNOSIS — Z8679 Personal history of other diseases of the circulatory system: Secondary | ICD-10-CM | POA: Diagnosis not present

## 2021-07-15 DIAGNOSIS — I2510 Atherosclerotic heart disease of native coronary artery without angina pectoris: Secondary | ICD-10-CM | POA: Diagnosis not present

## 2021-07-15 NOTE — Progress Notes (Signed)
PCP is Martinique, Betty G, MD ?Referring Provider is Martinique, Betty G, MD ? ?Chief Complaint  ?Patient presents with  ? Coronary Artery Disease  ?  New patient consultation [place] 03/08/21  ? ? ?HPI: ?Patient examined, images of coronary angiogram and echocardiogram performed December 2022 personally reviewed and counseled with patient and her son Otila Kluver. ? ?71 year old female with chronic back pain status post fusion, chronic anxiety and PTSD following the loss of 2 sons, type 2 diabetes on weekly injection therapy, hypertension.  Patient had cardiac catheterization in December which showed chronic occlusion of the LAD and mild disease of a codominant RCA and moderate-severe disease of the circumflex. ?Her echo showed mild-moderate anterior hypokinesia with EF of about 45%.  The patient's mother had undergone CABG several years ago and she was emotionally unprepared to undergo surgery.  Her cardiologist felt the circumflex disease could not be managed with PCI. ? ?She subsequently was evaluated in this office and was recommended for surgery to prevent further ischemic injury and preservation of LV function.  She was not agreeable to surgery returns for another evaluation. ? ?Patient denies symptoms of angina chest pain neck pain or exertional shortness of breath.  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  She states "I do not want to have my chest cut open".  She states "I am not going to go to a nursing home following surgery to recover until I can stay hme alone".  She still is clearly not mentally prepared or comfortable with CABG. ?She appears to be compliant with her medicines although has some side effects of leg cramps which she feels is related to the evening dose of Toprol. ? ?Past Medical History:  ?Diagnosis Date  ? 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[place])   ? Anxiety   ? Arthritis   ? Diabetes mellitus without complication ([place])   ? GERD (gastroesophageal reflux disease)   ? Heart murmur   ?  Hyperlipidemia   ? Hypertension   ? Neuromuscular disorder ([place])   ? Palpitations   ? Pneumonia   ? PONV (postoperative nausea and vomiting)   ? PTSD (post-traumatic stress disorder)   ? Ringing of ears, left   ? ? ?Past Surgical History:  ?Procedure Laterality Date  ? ABDOMINAL HYSTERECTOMY    ? partial  ? ACHILLES TENDON SURGERY Left   ? APPENDECTOMY    ? BACK SURGERY    ? BLADDER SUSPENSION    ? BREAST SURGERY Right 2003  ? milk duct removed  ? CHOLECYSTECTOMY    ? FRACTURE SURGERY    ? collar bone  ? LEFT HEART CATH AND CORONARY ANGIOGRAPHY N/A 03/08/2021  ? Procedure: LEFT HEART CATH AND CORONARY ANGIOGRAPHY;  Surgeon: Martinique, Wilhelmine Krogstad M, MD;  Location: [facility];  Service: Cardiovascular;  Laterality: N/A;  ? NOSE SURGERY    ? x 2   ? OTHER SURGICAL HISTORY    ? Breast Duct removed  ? PARTIAL HYSTERECTOMY    ? RECTOCELE REPAIR    ? TONSILLECTOMY    ? VASCULAR SURGERY Right   ? right leg  ? VEIN SURGERY Right   ? leg  ? ? ?Family History  ?Problem Relation Age of Onset  ? Diabetes Mother   ? Alzheimer's disease Mother   ? Lung cancer Father   ? Esophageal cancer Brother   ? Colon cancer Neg Hx   ? ? ?Social History ?Social History  ? ?Tobacco Use  ? Smoking  status: Never  ? Smokeless tobacco: Never  ?Vaping Use  ? Vaping Use: Never used  ?Substance Use Topics  ? Alcohol use: No  ? Drug use: No  ? ? ?Current Outpatient Medications  ?Medication Sig Dispense Refill  ? ACCU-CHEK SOFTCLIX LANCETS lancets Use to test blood sugar 3 times daily. Dx: E11.8 100 each 12  ? albuterol (VENTOLIN HFA) 108 (90 Base) MCG/ACT inhaler INHALE 2 PUFFS INTO THE LUNGS EVERY 6 HOURS AS NEEDED FOR WHEEZING OR SHORTNESS OF BREATH 18 g 2  ? Alcohol Swabs (B-D SINGLE USE SWABS REGULAR) PADS Use to test blood sugar 3 times daily 100 each 3  ? aspirin EC 81 MG tablet Take 81 mg by mouth daily. Swallow whole.    ? Blood Glucose Calibration (ACCU-CHEK AVIVA) SOLN Use as directed 1 each 2  ? Blood Glucose Monitoring Suppl (ACCU-CHEK AVIVA  PLUS) w/Device KIT Use to check blood sugar 3 times daily 1 kit 1  ? clonazePAM (KLONOPIN) 1 MG tablet TAKE 1 TABLET(1 MG) BY MOUTH TWICE DAILY AS NEEDED FOR ANXIETY 60 tablet 3  ? Dulaglutide (TRULICITY) 1.5 LP/3.7TK SOPN Inject 1.5 mg into the skin once a week. 13 mL 3  ? ezetimibe (ZETIA) 10 MG tablet Take 1 tablet (10 mg total) by mouth daily. 30 tablet 3  ? fluticasone (FLONASE) 50 MCG/ACT nasal spray Place 2 sprays into both nostrils daily as needed for allergies or rhinitis. 16 g 3  ? gabapentin (NEURONTIN) 600 MG tablet Take 1 tablet (600 mg total) by mouth in the morning, at noon, in the evening, and at bedtime. 120 tablet 3  ? glucose blood (ACCU-CHEK AVIVA) test strip Use to test blood sugar 3 times daily. Dx: 11.8 100 each 12  ? hydrochlorothiazide (HYDRODIURIL) 25 MG tablet TAKE 1 TABLET BY MOUTH EVERY DAY 90 tablet 3  ? HYDROcodone-acetaminophen (NORCO) 7.5-325 MG tablet Take 1 tablet by mouth every 6 (six) hours as needed for moderate pain. 120 tablet 0  ? ipratropium (ATROVENT) 0.06 % nasal spray Place 2 sprays into both nostrils 4 (four) times daily. 15 mL 3  ? Magnesium 250 MG TABS Take 250 mg by mouth daily.    ? metoprolol succinate (TOPROL XL) 25 MG 24 hr tablet Take 0.5 tablets (12.5 mg total) by mouth at bedtime. 45 tablet 3  ? omeprazole (PRILOSEC) 40 MG capsule Take 1 capsule (40 mg total) by mouth daily. 90 capsule 2  ? ondansetron (ZOFRAN-ODT) 4 MG disintegrating tablet Take 4 mg by mouth daily as needed for nausea/vomiting.    ? potassium chloride SA (KLOR-CON M) 20 MEQ tablet Take 1 tablet (20 mEq total) by mouth daily. 30 tablet 3  ? QUEtiapine (SEROQUEL) 100 MG tablet Take 0.5 tablets (50 mg total) by mouth at bedtime. 45 tablet 3  ? triamcinolone (KENALOG) 0.1 % paste Use as directed 1 application. in the mouth or throat 2 (two) times daily. 5 g 1  ? ?No current facility-administered medications for this visit.  ? ? ?Allergies  ?Allergen Reactions  ? Hydromorphone Nausea And Vomiting  and Other (See Comments)  ?  Made the patient feel "spaced out,"  ?  ? Morphine And Related Itching  ?  With high doses  ? Other Itching  ? Penicillins Rash  ?  Has patient had a PCN reaction causing immediate rash, facial/tongue/throat swelling, SOB or lightheadedness with hypotension: Yes ?Has patient had a PCN reaction causing severe rash involving mucus membranes or skin necrosis: No ?Has patient  had a PCN reaction that required hospitalization: No ?Has patient had a PCN reaction occurring within the last 10 years: No ?If all of the above answers are "NO", then may proceed with Cephalosporin use. ?  ? ? ?Review of Systems ?Losing weight on a heart healthy diet ?Day-to-day activities are affected by her chronic low back pain ?No history of DVT or varicose veins ?No difficulty swallowing ?Non-smoker ?Checked her blood sugars regularly ?Primary care physician is Betty Martinique, MD ? ?BP 116/74 (BP Location: Left Arm, Patient Position: Sitting, Cuff Size: Normal)   Pulse 89   Resp 20   Ht _0  (1.727 m)   Wt 181 lb 3.2 oz (82.2 kg)   SpO2 95% Comment: RA  BMI 27.55 kg/m?  ?Physical Exam ?    ?Physical Exam ? ?General: 71 year old anxious appearing but well presented female with her son no acute distress ?HEENT: Normocephalic pupils equal , dentition adequate ?Neck: Supple without JVD, adenopathy, or bruit ?Chest: Clear to auscultation, symmetrical breath sounds, no rhonchi, no tenderness ?            or deformity ?Cardiovascular: Regular rate and rhythm, no murmur, no gallop, peripheral pulses ?            palpable in all extremities ?Abdomen:  Soft, nontender, no palpable mass or organomegaly ?Extremities: Warm, well-perfused, no clubbing cyanosis edema or tenderness, ?             no venous stasis changes of the legs ?Rectal/GU: Deferred ?Neuro: Grossly non--focal and symmetrical throughout ?Skin: Clean and dry without rash or ulceration ? ? ?Diagnostic Tests: ?Two-vessel coronary artery disease with mild LV  dysfunction as noted above ?Type 2 diabetes ?Hyperlipidemia ?Chronic low back pain status post fusion ? ?Impression: ?Patient's coronary disease is being managed medically at this time and she is doing fairly well.

## 2021-07-25 DIAGNOSIS — L110 Acquired keratosis follicularis: Secondary | ICD-10-CM | POA: Diagnosis not present

## 2021-07-25 DIAGNOSIS — M79671 Pain in right foot: Secondary | ICD-10-CM | POA: Diagnosis not present

## 2021-07-25 DIAGNOSIS — I739 Peripheral vascular disease, unspecified: Secondary | ICD-10-CM | POA: Diagnosis not present

## 2021-07-25 DIAGNOSIS — E1140 Type 2 diabetes mellitus with diabetic neuropathy, unspecified: Secondary | ICD-10-CM | POA: Diagnosis not present

## 2021-07-30 DIAGNOSIS — M48061 Spinal stenosis, lumbar region without neurogenic claudication: Secondary | ICD-10-CM | POA: Diagnosis not present

## 2021-07-30 NOTE — Progress Notes (Deleted)
ACUTE VISIT No chief complaint on file.  HPI: [name] is a 71 y.o. female, who is here today complaining of *** HPI  Review of Systems Rest see pertinent positives and negatives per HPI.  Current Outpatient Medications on File Prior to Visit  Medication Sig Dispense Refill   ACCU-CHEK SOFTCLIX LANCETS lancets Use to test blood sugar 3 times daily. Dx: E11.8 100 each 12   albuterol (VENTOLIN HFA) 108 (90 Base) MCG/ACT inhaler INHALE 2 PUFFS INTO THE LUNGS EVERY 6 HOURS AS NEEDED FOR WHEEZING OR SHORTNESS OF BREATH 18 g 2   Alcohol Swabs (B-D SINGLE USE SWABS REGULAR) PADS Use to test blood sugar 3 times daily 100 each 3   aspirin EC 81 MG tablet Take 81 mg by mouth daily. Swallow whole.     Blood Glucose Calibration (ACCU-CHEK AVIVA) SOLN Use as directed 1 each 2   Blood Glucose Monitoring Suppl (ACCU-CHEK AVIVA PLUS) w/Device KIT Use to check blood sugar 3 times daily 1 kit 1   clonazePAM (KLONOPIN) 1 MG tablet TAKE 1 TABLET(1 MG) BY MOUTH TWICE DAILY AS NEEDED FOR ANXIETY 60 tablet 3   Dulaglutide (TRULICITY) 1.5 TI/4.5YK SOPN Inject 1.5 mg into the skin once a week. 13 mL 3   ezetimibe (ZETIA) 10 MG tablet Take 1 tablet (10 mg total) by mouth daily. 30 tablet 3   fluticasone (FLONASE) 50 MCG/ACT nasal spray Place 2 sprays into both nostrils daily as needed for allergies or rhinitis. 16 g 3   gabapentin (NEURONTIN) 600 MG tablet Take 1 tablet (600 mg total) by mouth in the morning, at noon, in the evening, and at bedtime. 120 tablet 3   glucose blood (ACCU-CHEK AVIVA) test strip Use to test blood sugar 3 times daily. Dx: 11.8 100 each 12   hydrochlorothiazide (HYDRODIURIL) 25 MG tablet TAKE 1 TABLET BY MOUTH EVERY DAY 90 tablet 3   HYDROcodone-acetaminophen (NORCO) 7.5-325 MG tablet Take 1 tablet by mouth every 6 (six) hours as needed for moderate pain. 120 tablet 0   ipratropium (ATROVENT) 0.06 % nasal spray Place 2 sprays into both nostrils 4 (four) times daily. 15 mL  3   Magnesium 250 MG TABS Take 250 mg by mouth daily.     metoprolol succinate (TOPROL XL) 25 MG 24 hr tablet Take 0.5 tablets (12.5 mg total) by mouth at bedtime. 45 tablet 3   omeprazole (PRILOSEC) 40 MG capsule Take 1 capsule (40 mg total) by mouth daily. 90 capsule 2   ondansetron (ZOFRAN-ODT) 4 MG disintegrating tablet Take 4 mg by mouth daily as needed for nausea/vomiting.     potassium chloride SA (KLOR-CON M) 20 MEQ tablet Take 1 tablet (20 mEq total) by mouth daily. 30 tablet 3   QUEtiapine (SEROQUEL) 100 MG tablet Take 0.5 tablets (50 mg total) by mouth at bedtime. 45 tablet 3   triamcinolone (KENALOG) 0.1 % paste Use as directed 1 application. in the mouth or throat 2 (two) times daily. 5 g 1   No current facility-administered medications on file prior to visit.     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[place])    Anxiety    Arthritis    Diabetes mellitus without complication (HCC)    GERD (gastroesophageal reflux disease)    Heart murmur    Hyperlipidemia    Hypertension    Neuromuscular disorder (HCC)    Palpitations    Pneumonia    PONV (postoperative nausea and  vomiting)    PTSD (post-traumatic stress disorder)    Ringing of ears, left    Allergies  Allergen Reactions   Hydromorphone Nausea And Vomiting and Other (See Comments)    Made the patient feel "spaced out,"     Morphine And Related Itching    With high doses   Other Itching   Penicillins Rash    Has patient had a PCN reaction causing immediate rash, facial/tongue/throat swelling, SOB or lightheadedness with hypotension: Yes Has patient had a PCN reaction causing severe rash involving mucus membranes or skin necrosis: No Has patient had a PCN reaction that required hospitalization: No Has patient had a PCN reaction occurring within the last 10 years: No If all of the above answers are "NO", then may proceed with Cephalosporin use.     Social History   Socioeconomic History    Marital status: Divorced    Spouse name: Not on file   Number of children: Not on file   Years of education: Not on file   Highest education level: Not on file  Occupational History   Not on file  Tobacco Use   Smoking status: Never   Smokeless tobacco: Never  Vaping Use   Vaping Use: Never used  Substance and Sexual Activity   Alcohol use: No   Drug use: No   Sexual activity: Not on file  Other Topics Concern   Not on file  Social History Narrative   Not on file   Social Determinants of Health   Financial Resource Strain: Low Risk    Difficulty of Paying Living Expenses: Not hard at all  Food Insecurity: No Food Insecurity   Worried About Running Out of Food in the Last Year: Never true   [place] in the Last Year: Never true  Transportation Needs: No Transportation Needs   Lack of Transportation (Medical): No   Lack of Transportation (Non-Medical): No  Physical Activity: Insufficiently Active   Days of Exercise per Week: 2 days   Minutes of Exercise per Session: 40 min  Stress: No Stress Concern Present   Feeling of Stress : Not at all  Social Connections: Moderately Integrated   Frequency of Communication with Friends and Family: Twice a week   Frequency of Social Gatherings with Friends and Family: Twice a week   Attends Religious Services: More than 4 times per year   Active Member of [employer] or Organizations: Yes   Attends [employer] Meetings: 1 to 4 times per year   Marital Status: Widowed    There were no vitals filed for this visit. There is no height or weight on file to calculate BMI.  Physical Exam  ASSESSMENT AND PLAN:  There are no diagnoses linked to this encounter.   No follow-ups on file.   Betty G. Martinique, MD  [facility] [place] office.  Discharge Instructions   None

## 2021-07-31 ENCOUNTER — Telehealth: Payer: Self-pay | Admitting: Student

## 2021-07-31 ENCOUNTER — Ambulatory Visit: Payer: Medicare Other | Admitting: Family Medicine

## 2021-07-31 ENCOUNTER — Encounter: Payer: Self-pay | Admitting: Family Medicine

## 2021-07-31 MED ORDER — FLUTICASONE PROPIONATE 50 MCG/ACT NA SUSP: 2.0000 | Freq: Every day | NASAL | 3 refills | Status: DC | PRN

## 2021-07-31 NOTE — Telephone Encounter (Signed)
Pt c/o medication issue: ? ?1. Name of Medication: metoprolol succinate (TOPROL XL) 25 MG 24 hr tablet ? ?2. How are you currently taking this medication (dosage and times per day)? Half a tablet daily ? ?3. Are you having a reaction (difficulty breathing--STAT)? no ? ?4. What is your medication issue? Patient states she found out her leg pain, weight gain, and fatigue could be fluid building up from the medication. She states she has gained 5 lbs since starting the medication and does not want to continue the medication until she speaks with someone, because she is so concerned.  ?

## 2021-07-31 NOTE — Telephone Encounter (Signed)
Returned call to pt no answer, left msg to call back.  ?

## 2021-08-02 NOTE — Telephone Encounter (Signed)
Pt states that she is uncomfortable taking Toprol XL because she has gained 5 lbs since started taking it. She reports that she is very tired and has leg pain. Pt wants B.Strader to know that she went and saw Dr. Darcey Nora. She was told that she could be treated medically.   ? ?

## 2021-08-02 NOTE — Telephone Encounter (Signed)
? ?  She has been on Toprol-XL 12.5 mg daily as she had a 24% PVC burden by her prior monitor.  Her frequent extra beats were likely secondary to her known blockages and the frequent extra beats can cause additional weakness to her heart muscle. In regards to medical management of her coronary artery disease, a beta-blocker such as Metoprolol is an essential part of this. If she wishes to try a different beta-blocker, we could stop Toprol-XL 12.5 mg daily and switch to Bisoprolol 2.5 mg daily as sometimes it causes less fatigue. She is scheduled to see Dr. Darcey Nora with CT Surgery in 08/2021 and I would strongly encourage her to keep this appointment as he did not feel that she was comfortable proceeding with surgery at the time of her last visit and was planning to readdress options at follow-up. ? ?Signed, ?Erma Heritage, PA-C ?08/02/2021, 3:28 PM ?Pager: [phone] ? ?

## 2021-08-05 ENCOUNTER — Encounter: Payer: Self-pay | Admitting: Family Medicine

## 2021-08-05 NOTE — Telephone Encounter (Signed)
Left message to return call to discuss message from B.Strader,PA-C ?

## 2021-08-06 ENCOUNTER — Ambulatory Visit: Payer: Medicare Other | Admitting: Family Medicine

## 2021-08-08 NOTE — Telephone Encounter (Signed)
Left message to return call 

## 2021-08-08 NOTE — Telephone Encounter (Signed)
I sent [name]'s message to patient via MyChart. ?

## 2021-08-09 ENCOUNTER — Encounter: Payer: Medicare Other | Attending: Physical Medicine & Rehabilitation | Admitting: Registered Nurse

## 2021-08-09 ENCOUNTER — Encounter: Payer: Self-pay | Admitting: Registered Nurse

## 2021-08-09 VITALS — BP 138/80 | HR 83 | Ht 68.0 in | Wt 183.0 lb

## 2021-08-09 DIAGNOSIS — Z5181 Encounter for therapeutic drug level monitoring: Secondary | ICD-10-CM

## 2021-08-09 DIAGNOSIS — M5412 Radiculopathy, cervical region: Secondary | ICD-10-CM | POA: Diagnosis not present

## 2021-08-09 DIAGNOSIS — M2550 Pain in unspecified joint: Secondary | ICD-10-CM

## 2021-08-09 DIAGNOSIS — Z79891 Long term (current) use of opiate analgesic: Secondary | ICD-10-CM | POA: Diagnosis not present

## 2021-08-09 DIAGNOSIS — E1142 Type 2 diabetes mellitus with diabetic polyneuropathy: Secondary | ICD-10-CM | POA: Diagnosis not present

## 2021-08-09 DIAGNOSIS — G894 Chronic pain syndrome: Secondary | ICD-10-CM | POA: Diagnosis not present

## 2021-08-09 DIAGNOSIS — M542 Cervicalgia: Secondary | ICD-10-CM | POA: Diagnosis not present

## 2021-08-09 DIAGNOSIS — M5416 Radiculopathy, lumbar region: Secondary | ICD-10-CM | POA: Diagnosis not present

## 2021-08-09 DIAGNOSIS — M47812 Spondylosis without myelopathy or radiculopathy, cervical region: Secondary | ICD-10-CM | POA: Diagnosis not present

## 2021-08-09 MED ORDER — HYDROCODONE-ACETAMINOPHEN 7.5-325 MG PO TABS: 1.0000 | ORAL_TABLET | Freq: Four times a day (QID) | ORAL | 0 refills | Status: DC | PRN

## 2021-08-09 NOTE — Progress Notes (Signed)
? ?Subjective:  ? ? Patient ID: [name], female    DOB: 02/21/1951, 71 y.o.   MRN: [id number] ? ?HPI: [name] is a 71 y.o. female who returns for follow up appointment for chronic pain and medication refill. She states her pain is located in her neck radiating into her left shoulder,lower back pain radiating into her left lower extremity and left foot with tingling and burning. She rates her pain 9. Her current exercise regime is walking and performing stretching exercises. ? ?[name] arrived to office reporting increase pain in her bilateral lower extremities, asked for a wheelchair. She also stated she had a coffee from [employer], she felt a little jittery. She was placed in a wheelchair.  ?At the end of office visit, she walked in examining room, denies any dizziness or feeling jittery.  ? ?[name]  Morphine equivalent is 30.00 MME.   Oral Swab was Performed today.  ?Pain Inventory ?Average Pain 9 ?Pain Right Now 9 ?My pain is sharp, burning, and aching ? ?In the last 24 hours, has pain interfered with the following? ?General activity 7 ?Relation with others 7 ?Enjoyment of life 7 ?What TIME of day is your pain at its worst? daytime and night ?Sleep (in general) Fair ? ?Pain is worse with: walking, inactivity, standing, and some activites ?Pain improves with: rest, heat/ice, pacing activities, medication, and injections ?Relief from Meds: 10 ? ?Family History  ?Problem Relation Age of Onset  ? Diabetes Mother   ? Alzheimer's disease Mother   ? Lung cancer Father   ? Esophageal cancer Brother   ? Colon cancer Neg Hx   ? ?Social History  ? ?Socioeconomic History  ? Marital status: Divorced  ?  Spouse name: Not on file  ? Number of children: Not on file  ? Years of education: Not on file  ? Highest education level: Not on file  ?Occupational History  ? Not on file  ?Tobacco Use  ? Smoking status: Never  ? Smokeless tobacco: Never  ?Vaping Use  ? Vaping Use: Never used  ?Substance and  Sexual Activity  ? Alcohol use: No  ? Drug use: No  ? Sexual activity: Not on file  ?Other Topics Concern  ? Not on file  ?Social History Narrative  ? Not on file  ? ?Social Determinants of Health  ? ?Financial Resource Strain: Low Risk   ? Difficulty of Paying Living Expenses: Not hard at all  ?Food Insecurity: No Food Insecurity  ? Worried About Charity fundraiser in the Last Year: Never true  ? Ran Out of Food in the Last Year: Never true  ?Transportation Needs: No Transportation Needs  ? Lack of Transportation (Medical): No  ? Lack of Transportation (Non-Medical): No  ?Physical Activity: Insufficiently Active  ? Days of Exercise per Week: 2 days  ? Minutes of Exercise per Session: 40 min  ?Stress: No Stress Concern Present  ? Feeling of Stress : Not at all  ?Social Connections: Moderately Integrated  ? Frequency of Communication with Friends and Family: Twice a week  ? Frequency of Social Gatherings with Friends and Family: Twice a week  ? Attends Religious Services: More than 4 times per year  ? Active Member of Clubs or Organizations: Yes  ? Attends Archivist Meetings: 1 to 4 times per year  ? Marital Status: Widowed  ? ?Past Surgical History:  ?Procedure Laterality Date  ? ABDOMINAL HYSTERECTOMY    ? partial  ?  ACHILLES TENDON SURGERY Left   ? APPENDECTOMY    ? BACK SURGERY    ? BLADDER SUSPENSION    ? BREAST SURGERY Right 2003  ? milk duct removed  ? CHOLECYSTECTOMY    ? FRACTURE SURGERY    ? collar bone  ? LEFT HEART CATH AND CORONARY ANGIOGRAPHY N/A 03/08/2021  ? Procedure: LEFT HEART CATH AND CORONARY ANGIOGRAPHY;  Surgeon: Martinique, Peter M, MD;  Location: [facility];  Service: Cardiovascular;  Laterality: N/A;  ? NOSE SURGERY    ? x 2   ? OTHER SURGICAL HISTORY    ? Breast Duct removed  ? PARTIAL HYSTERECTOMY    ? RECTOCELE REPAIR    ? TONSILLECTOMY    ? VASCULAR SURGERY Right   ? right leg  ? VEIN SURGERY Right   ? leg  ? ?Past Surgical History:  ?Procedure Laterality Date  ? ABDOMINAL  HYSTERECTOMY    ? partial  ? ACHILLES TENDON SURGERY Left   ? APPENDECTOMY    ? BACK SURGERY    ? BLADDER SUSPENSION    ? BREAST SURGERY Right 2003  ? milk duct removed  ? CHOLECYSTECTOMY    ? FRACTURE SURGERY    ? collar bone  ? LEFT HEART CATH AND CORONARY ANGIOGRAPHY N/A 03/08/2021  ? Procedure: LEFT HEART CATH AND CORONARY ANGIOGRAPHY;  Surgeon: Martinique, Peter M, MD;  Location: [facility];  Service: Cardiovascular;  Laterality: N/A;  ? NOSE SURGERY    ? x 2   ? OTHER SURGICAL HISTORY    ? Breast Duct removed  ? PARTIAL HYSTERECTOMY    ? RECTOCELE REPAIR    ? TONSILLECTOMY    ? VASCULAR SURGERY Right   ? right leg  ? VEIN SURGERY Right   ? leg  ? ?Past Medical History:  ?Diagnosis Date  ? 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[place])   ? Anxiety   ? Arthritis   ? Diabetes mellitus without complication ([place])   ? GERD (gastroesophageal reflux disease)   ? Heart murmur   ? Hyperlipidemia   ? Hypertension   ? Neuromuscular disorder ([place])   ? Palpitations   ? Pneumonia   ? PONV (postoperative nausea and vomiting)   ? PTSD (post-traumatic stress disorder)   ? Ringing of ears, left   ? ?BP 138/80   Pulse 83   Ht '5\' 8"'$  (1.727 m)   Wt 183 lb (83 kg)   SpO2 97%   BMI 27.83 kg/m?  ? ?Opioid Risk Score:   ?Fall Risk Score:  `1 ? ?Depression screen PHQ 2/9 ? ? ?  06/10/2021  ?  2:54 PM 05/14/2021  ?  3:21 PM 04/23/2021  ?  2:28 PM 04/11/2021  ?  2:42 PM 12/06/2020  ?  3:01 PM 11/06/2020  ?  3:32 PM 11/06/2020  ?  3:27 PM  ?Depression screen PHQ 2/9  ?Decreased Interest 0 0 1 0 1 0 0  ?Down, Depressed, Hopeless '3 2 1 1 1 '$ 0 0  ?PHQ - 2 Score '3 2 2 1 2 '$ 0 0  ?Altered sleeping  2 1      ?Tired, decreased energy  1 1      ?Change in appetite  2 1      ?Feeling bad or failure about yourself   1 0      ?Trouble concentrating  1 0      ?Moving slowly or fidgety/restless  1 0      ?Suicidal thoughts  0 0      ?PHQ-9 Score  10 5      ?Difficult doing work/chores  Somewhat difficult Somewhat difficult      ?  ? ?Review of Systems  ?Musculoskeletal:   ?      Left shoulder pain ?Bilateral upper leg pain ?Bilateral foot pain  ?All other systems reviewed and are negative. ? ?   ?Objective:  ? Physical Exam ?Vitals and nursing note reviewed.  ?Constitutional:   ?   Appearance: Normal appearance.  ?Neck:  ?   Comments: Cervical Paraspinal Tenderness: C-5-C-6 Mainly Left Side ?Cardiovascular:  ?   Rate and Rhythm: Normal rate and regular rhythm.  ?   Pulses: Normal pulses.  ?   Heart sounds: Normal heart sounds.  ?Pulmonary:  ?   Effort: Pulmonary effort is normal.  ?   Breath sounds: Normal breath sounds.  ?Musculoskeletal:  ?   Cervical back: Normal range of motion and neck supple.  ?   Comments: Normal Muscle Bulk and Muscle Testing Reveals:  ?Upper Extremities: Right: Full ROM and Muscle Strength  5/5 ?Left Upper Extremity: Decreased ROM 90 Degrees  and Muscle Strength 4/5 ? Lumbar Paraspinal Tenderness: L-4-L-5 ?Lower Extremities: Right: Full ROM and Muscle Strength 5/5 ?Left Lower Extremity: Decreased ROM and Muscle Strength 4/5 ?Left Lower Extremity Flexion Produces Pain into her Left Lower Extremity ?Arises from Wheelchair slowly ?Antalgic  Gait  ?   ?Skin: ?   General: Skin is warm and dry.  ?Neurological:  ?   Mental Status: She is alert and oriented to person, place, and time.  ?Psychiatric:     ?   Mood and Affect: Mood normal.     ?   Behavior: Behavior normal.  ? ? ? ? ?   ?Assessment & Plan:  ?1.Chronic cervicalgia with documented spondylosis on MRI, per Dr. Naaman Plummer Note and Cervical Radiculitis: Continue Gabapentin,  Continue HEP as Tolerated. 08/09/2021 ?2. Left shoulder pain most consistent with left rotator cuff syndrome. Mild DJD.  Continue HEP as tolerated.Continue to monitor. 08/09/2021. ?Refilled: Hydrocodone 7.5/325 mg one tablet every 6 hours as needed for pain. #120. Second script sent for the following month. ?We will continue the opioid monitoring program, this consists of regular clinic visits, examinations, urine drug screen, pill counts as well  as use of [place] Controlled Substance Reporting system. A 12 month History has been reviewed on the [place] Controlled Substance Reporting System on 08/09/2021. ?3. Left wrist/finger pain most con

## 2021-08-12 ENCOUNTER — Other Ambulatory Visit: Payer: Self-pay | Admitting: Family Medicine

## 2021-08-12 ENCOUNTER — Encounter: Payer: Self-pay | Admitting: Family Medicine

## 2021-08-12 DIAGNOSIS — E876 Hypokalemia: Secondary | ICD-10-CM

## 2021-08-15 LAB — DRUG TOX MONITOR 1 W/CONF, ORAL FLD
Alprazolam: NEGATIVE ng/mL (ref <0.50)
Amphetamines: NEGATIVE ng/mL (ref <10)
Barbiturates: NEGATIVE ng/mL (ref <10)
Benzodiazepines: POSITIVE ng/mL — AB (ref <0.50)
Buprenorphine: NEGATIVE ng/mL (ref <0.10)
Chlordiazepoxide: NEGATIVE ng/mL (ref <0.50)
Clonazepam: 1.23 ng/mL — ABNORMAL HIGH (ref <0.50)
Cocaine: NEGATIVE ng/mL (ref <5.0)
Codeine: NEGATIVE ng/mL (ref <2.5)
Diazepam: NEGATIVE ng/mL (ref <0.50)
Dihydrocodeine: 7.1 ng/mL — ABNORMAL HIGH (ref <2.5)
Fentanyl: NEGATIVE ng/mL (ref <0.10)
Flunitrazepam: NEGATIVE ng/mL (ref <0.50)
Flurazepam: NEGATIVE ng/mL (ref <0.50)
Heroin Metabolite: NEGATIVE ng/mL (ref <1.0)
Hydrocodone: 45.6 ng/mL — ABNORMAL HIGH (ref <2.5)
Hydromorphone: NEGATIVE ng/mL (ref <2.5)
Lorazepam: NEGATIVE ng/mL (ref <0.50)
MARIJUANA: NEGATIVE ng/mL (ref <2.5)
MDMA: NEGATIVE ng/mL (ref <10)
Meprobamate: NEGATIVE ng/mL (ref <2.5)
Methadone: NEGATIVE ng/mL (ref <5.0)
Midazolam: NEGATIVE ng/mL (ref <0.50)
Morphine: NEGATIVE ng/mL (ref <2.5)
Nicotine Metabolite: NEGATIVE ng/mL (ref <5.0)
Nordiazepam: NEGATIVE ng/mL (ref <0.50)
Norhydrocodone: 3.9 ng/mL — ABNORMAL HIGH (ref <2.5)
Noroxycodone: NEGATIVE ng/mL (ref <2.5)
Opiates: POSITIVE ng/mL — AB (ref <2.5)
Oxazepam: NEGATIVE ng/mL (ref <0.50)
Oxycodone: NEGATIVE ng/mL (ref <2.5)
Oxymorphone: NEGATIVE ng/mL (ref <2.5)
Phencyclidine: NEGATIVE ng/mL (ref <10)
Tapentadol: NEGATIVE ng/mL (ref <5.0)
Temazepam: NEGATIVE ng/mL (ref <0.50)
Tramadol: NEGATIVE ng/mL (ref <5.0)
Triazolam: NEGATIVE ng/mL (ref <0.50)
Zolpidem: NEGATIVE ng/mL (ref <5.0)

## 2021-08-15 LAB — DRUG TOX ALC METAB W/CON, ORAL FLD: Alcohol Metabolite: NEGATIVE ng/mL (ref <25)

## 2021-08-19 ENCOUNTER — Ambulatory Visit: Payer: Medicare Other | Admitting: Cardiothoracic Surgery

## 2021-08-21 DIAGNOSIS — R079 Chest pain, unspecified: Secondary | ICD-10-CM | POA: Diagnosis not present

## 2021-08-21 DIAGNOSIS — E860 Dehydration: Secondary | ICD-10-CM | POA: Diagnosis not present

## 2021-08-21 DIAGNOSIS — R197 Diarrhea, unspecified: Secondary | ICD-10-CM | POA: Diagnosis not present

## 2021-08-21 DIAGNOSIS — E119 Type 2 diabetes mellitus without complications: Secondary | ICD-10-CM | POA: Diagnosis not present

## 2021-08-21 DIAGNOSIS — I214 Non-ST elevation (NSTEMI) myocardial infarction: Secondary | ICD-10-CM | POA: Diagnosis not present

## 2021-08-21 DIAGNOSIS — R110 Nausea: Secondary | ICD-10-CM | POA: Diagnosis not present

## 2021-08-21 DIAGNOSIS — Z9049 Acquired absence of other specified parts of digestive tract: Secondary | ICD-10-CM | POA: Diagnosis not present

## 2021-08-21 DIAGNOSIS — Z90710 Acquired absence of both cervix and uterus: Secondary | ICD-10-CM | POA: Diagnosis not present

## 2021-08-22 ENCOUNTER — Emergency Department (HOSPITAL_COMMUNITY): Payer: Medicare Other

## 2021-08-22 ENCOUNTER — Encounter (HOSPITAL_COMMUNITY): Payer: Self-pay | Admitting: *Deleted

## 2021-08-22 ENCOUNTER — Inpatient Hospital Stay (HOSPITAL_COMMUNITY)
Admission: EM | Admit: 2021-08-22 | Discharge: 2021-08-24 | DRG: 311 | Disposition: A | Discharge status: Home / Self Care | Payer: Medicare Other | Attending: Internal Medicine | Admitting: Internal Medicine

## 2021-08-22 ENCOUNTER — Other Ambulatory Visit: Payer: Self-pay

## 2021-08-22 ENCOUNTER — Ambulatory Visit: Payer: Medicare Other | Admitting: Orthopaedic Surgery

## 2021-08-22 ENCOUNTER — Other Ambulatory Visit (HOSPITAL_COMMUNITY): Payer: Medicare Other

## 2021-08-22 ENCOUNTER — Observation Stay (HOSPITAL_COMMUNITY): Payer: Medicare Other

## 2021-08-22 DIAGNOSIS — R112 Nausea with vomiting, unspecified: Secondary | ICD-10-CM | POA: Diagnosis not present

## 2021-08-22 DIAGNOSIS — E1149 Type 2 diabetes mellitus with other diabetic neurological complication: Secondary | ICD-10-CM | POA: Diagnosis not present

## 2021-08-22 DIAGNOSIS — R109 Unspecified abdominal pain: Secondary | ICD-10-CM | POA: Diagnosis not present

## 2021-08-22 DIAGNOSIS — I214 Non-ST elevation (NSTEMI) myocardial infarction: Secondary | ICD-10-CM | POA: Diagnosis not present

## 2021-08-22 DIAGNOSIS — E7800 Pure hypercholesterolemia, unspecified: Secondary | ICD-10-CM | POA: Diagnosis present

## 2021-08-22 DIAGNOSIS — Z833 Family history of diabetes mellitus: Secondary | ICD-10-CM | POA: Diagnosis not present

## 2021-08-22 DIAGNOSIS — E860 Dehydration: Secondary | ICD-10-CM | POA: Diagnosis present

## 2021-08-22 DIAGNOSIS — K219 Gastro-esophageal reflux disease without esophagitis: Secondary | ICD-10-CM | POA: Diagnosis present

## 2021-08-22 DIAGNOSIS — Z885 Allergy status to narcotic agent status: Secondary | ICD-10-CM | POA: Diagnosis not present

## 2021-08-22 DIAGNOSIS — Z7985 Long-term (current) use of injectable non-insulin antidiabetic drugs: Secondary | ICD-10-CM | POA: Diagnosis not present

## 2021-08-22 DIAGNOSIS — I493 Ventricular premature depolarization: Secondary | ICD-10-CM | POA: Diagnosis not present

## 2021-08-22 DIAGNOSIS — I110 Hypertensive heart disease with heart failure: Secondary | ICD-10-CM | POA: Diagnosis not present

## 2021-08-22 DIAGNOSIS — Z79899 Other long term (current) drug therapy: Secondary | ICD-10-CM | POA: Diagnosis not present

## 2021-08-22 DIAGNOSIS — Z880 Allergy status to penicillin: Secondary | ICD-10-CM | POA: Diagnosis not present

## 2021-08-22 DIAGNOSIS — F4310 Post-traumatic stress disorder, unspecified: Secondary | ICD-10-CM | POA: Diagnosis present

## 2021-08-22 DIAGNOSIS — F419 Anxiety disorder, unspecified: Secondary | ICD-10-CM | POA: Diagnosis present

## 2021-08-22 DIAGNOSIS — N179 Acute kidney failure, unspecified: Secondary | ICD-10-CM | POA: Diagnosis present

## 2021-08-22 DIAGNOSIS — I255 Ischemic cardiomyopathy: Secondary | ICD-10-CM | POA: Diagnosis present

## 2021-08-22 DIAGNOSIS — I5022 Chronic systolic (congestive) heart failure: Secondary | ICD-10-CM | POA: Diagnosis not present

## 2021-08-22 DIAGNOSIS — Z7982 Long term (current) use of aspirin: Secondary | ICD-10-CM

## 2021-08-22 DIAGNOSIS — I248 Other forms of acute ischemic heart disease: Principal | ICD-10-CM | POA: Diagnosis present

## 2021-08-22 DIAGNOSIS — E871 Hypo-osmolality and hyponatremia: Secondary | ICD-10-CM | POA: Diagnosis not present

## 2021-08-22 DIAGNOSIS — E876 Hypokalemia: Secondary | ICD-10-CM | POA: Diagnosis not present

## 2021-08-22 DIAGNOSIS — R7989 Other specified abnormal findings of blood chemistry: Secondary | ICD-10-CM | POA: Diagnosis not present

## 2021-08-22 DIAGNOSIS — F411 Generalized anxiety disorder: Secondary | ICD-10-CM | POA: Diagnosis present

## 2021-08-22 DIAGNOSIS — K2100 Gastro-esophageal reflux disease with esophagitis, without bleeding: Secondary | ICD-10-CM

## 2021-08-22 DIAGNOSIS — R197 Diarrhea, unspecified: Secondary | ICD-10-CM | POA: Diagnosis not present

## 2021-08-22 DIAGNOSIS — M791 Myalgia, unspecified site: Secondary | ICD-10-CM

## 2021-08-22 DIAGNOSIS — Z888 Allergy status to other drugs, medicaments and biological substances status: Secondary | ICD-10-CM

## 2021-08-22 DIAGNOSIS — A084 Viral intestinal infection, unspecified: Secondary | ICD-10-CM | POA: Diagnosis not present

## 2021-08-22 DIAGNOSIS — E1142 Type 2 diabetes mellitus with diabetic polyneuropathy: Secondary | ICD-10-CM

## 2021-08-22 DIAGNOSIS — R079 Chest pain, unspecified: Secondary | ICD-10-CM | POA: Diagnosis not present

## 2021-08-22 DIAGNOSIS — I2510 Atherosclerotic heart disease of native coronary artery without angina pectoris: Secondary | ICD-10-CM | POA: Diagnosis present

## 2021-08-22 DIAGNOSIS — Z20822 Contact with and (suspected) exposure to covid-19: Secondary | ICD-10-CM | POA: Diagnosis present

## 2021-08-22 DIAGNOSIS — J189 Pneumonia, unspecified organism: Secondary | ICD-10-CM | POA: Diagnosis not present

## 2021-08-22 DIAGNOSIS — I10 Essential (primary) hypertension: Secondary | ICD-10-CM | POA: Diagnosis present

## 2021-08-22 DIAGNOSIS — E119 Type 2 diabetes mellitus without complications: Secondary | ICD-10-CM | POA: Diagnosis not present

## 2021-08-22 DIAGNOSIS — K921 Melena: Secondary | ICD-10-CM

## 2021-08-22 LAB — COMPREHENSIVE METABOLIC PANEL
ALT: 17 U/L (ref 0–44)
ALT: 17 U/L (ref 0–44)
AST: 20 U/L (ref 15–41)
AST: 22 U/L (ref 15–41)
Albumin: 3.7 g/dL (ref 3.5–5.0)
Albumin: 3.5 g/dL (ref 3.5–5.0)
Alkaline Phosphatase: 53 U/L (ref 38–126)
Alkaline Phosphatase: 53 U/L (ref 38–126)
Anion gap: 9 (ref 5–15)
Anion gap: 9 (ref 5–15)
BUN: 21 mg/dL (ref 8–23)
BUN: 18 mg/dL (ref 8–23)
CO2: 26 mmol/L (ref 22–32)
CO2: 26 mmol/L (ref 22–32)
Calcium: 8.8 mg/dL — ABNORMAL LOW (ref 8.9–10.3)
Calcium: 8.7 mg/dL — ABNORMAL LOW (ref 8.9–10.3)
Chloride: 103 mmol/L (ref 98–111)
Chloride: 104 mmol/L (ref 98–111)
Creatinine, Ser: 1.21 mg/dL — ABNORMAL HIGH (ref 0.44–1.00)
Creatinine, Ser: 0.94 mg/dL (ref 0.44–1.00)
GFR, Estimated: 49 mL/min — ABNORMAL LOW (ref >60)
GFR, Estimated: >60 mL/min (ref >60)
Glucose, Bld: 107 mg/dL — ABNORMAL HIGH (ref 70–99)
Glucose, Bld: 113 mg/dL — ABNORMAL HIGH (ref 70–99)
Potassium: 3.2 mmol/L — ABNORMAL LOW (ref 3.5–5.1)
Potassium: 3.6 mmol/L (ref 3.5–5.1)
Sodium: 138 mmol/L (ref 135–145)
Sodium: 139 mmol/L (ref 135–145)
Total Bilirubin: 1 mg/dL (ref 0.3–1.2)
Total Bilirubin: 0.5 mg/dL (ref 0.3–1.2)
Total Protein: 6.6 g/dL (ref 6.5–8.1)
Total Protein: 6.3 g/dL — ABNORMAL LOW (ref 6.5–8.1)

## 2021-08-22 LAB — SODIUM, URINE, RANDOM: Sodium, Ur: 29 mmol/L

## 2021-08-22 LAB — CBC WITH DIFFERENTIAL/PLATELET
Abs Immature Granulocytes: 0.02 10*3/uL (ref 0.00–0.07)
Basophils Absolute: 0 10*3/uL (ref 0.0–0.1)
Basophils Relative: 1 %
Eosinophils Absolute: 0 10*3/uL (ref 0.0–0.5)
Eosinophils Relative: 1 %
HCT: 36 % (ref 36.0–46.0)
Hemoglobin: 12.5 g/dL (ref 12.0–15.0)
Immature Granulocytes: 0 %
Lymphocytes Relative: 29 %
Lymphs Abs: 1.5 10*3/uL (ref 0.7–4.0)
MCH: 32 pg (ref 26.0–34.0)
MCHC: 34.7 g/dL (ref 30.0–36.0)
MCV: 92.1 fL (ref 80.0–100.0)
Monocytes Absolute: 2 10*3/uL — ABNORMAL HIGH (ref 0.1–1.0)
Monocytes Relative: 40 %
Neutro Abs: 1.5 10*3/uL — ABNORMAL LOW (ref 1.7–7.7)
Neutrophils Relative %: 29 %
Platelets: 157 10*3/uL (ref 150–400)
RBC: 3.91 MIL/uL (ref 3.87–5.11)
RDW: 12.1 % (ref 11.5–15.5)
WBC: 5.1 10*3/uL (ref 4.0–10.5)
nRBC: 0 % (ref 0.0–0.2)

## 2021-08-22 LAB — URINALYSIS, COMPLETE (UACMP) WITH MICROSCOPIC
Bacteria, UA: NONE SEEN
Bilirubin Urine: NEGATIVE
Glucose, UA: NEGATIVE mg/dL
Hgb urine dipstick: NEGATIVE
Ketones, ur: NEGATIVE mg/dL
Leukocytes,Ua: NEGATIVE
Nitrite: NEGATIVE
Protein, ur: NEGATIVE mg/dL
Specific Gravity, Urine: 1.006 (ref 1.005–1.030)
pH: 5 (ref 5.0–8.0)

## 2021-08-22 LAB — ECHOCARDIOGRAM COMPLETE
AR max vel: 1.8 cm2
AV Area VTI: 1.88 cm2
AV Area mean vel: 1.73 cm2
AV Mean grad: 7 mmHg
AV Peak grad: 12 mmHg
Ao pk vel: 1.73 m/s
Area-P 1/2: 4.08 cm2
S' Lateral: 3.5 cm

## 2021-08-22 LAB — HEMOGLOBIN A1C
Hgb A1c MFr Bld: 6.3 % — ABNORMAL HIGH (ref 4.8–5.6)
Mean Plasma Glucose: 134.11 mg/dL

## 2021-08-22 LAB — TROPONIN I (HIGH SENSITIVITY)
Troponin I (High Sensitivity): 86 ng/L — ABNORMAL HIGH (ref <18)
Troponin I (High Sensitivity): 80 ng/L — ABNORMAL HIGH (ref <18)

## 2021-08-22 LAB — CBC
HCT: 36.9 % (ref 36.0–46.0)
Hemoglobin: 12.7 g/dL (ref 12.0–15.0)
MCH: 31.8 pg (ref 26.0–34.0)
MCHC: 34.4 g/dL (ref 30.0–36.0)
MCV: 92.5 fL (ref 80.0–100.0)
Platelets: 165 10*3/uL (ref 150–400)
RBC: 3.99 MIL/uL (ref 3.87–5.11)
RDW: 12.1 % (ref 11.5–15.5)
WBC: 6.1 10*3/uL (ref 4.0–10.5)
nRBC: 0 % (ref 0.0–0.2)

## 2021-08-22 LAB — PHOSPHORUS: Phosphorus: 3.8 mg/dL (ref 2.5–4.6)

## 2021-08-22 LAB — BRAIN NATRIURETIC PEPTIDE: B Natriuretic Peptide: 172.3 pg/mL — ABNORMAL HIGH (ref 0.0–100.0)

## 2021-08-22 LAB — RESP PANEL BY RT-PCR (FLU A&B, COVID) ARPGX2
Influenza A by PCR: NEGATIVE
Influenza B by PCR: NEGATIVE
SARS Coronavirus 2 by RT PCR: NEGATIVE

## 2021-08-22 LAB — MAGNESIUM
Magnesium: 1.9 mg/dL (ref 1.7–2.4)
Magnesium: 2.2 mg/dL (ref 1.7–2.4)

## 2021-08-22 LAB — CBG MONITORING, ED
Glucose-Capillary: 108 mg/dL — ABNORMAL HIGH (ref 70–99)
Glucose-Capillary: 96 mg/dL (ref 70–99)
Glucose-Capillary: 106 mg/dL — ABNORMAL HIGH (ref 70–99)

## 2021-08-22 LAB — PROTIME-INR
INR: 1.1 (ref 0.8–1.2)
Prothrombin Time: 14.5 seconds (ref 11.4–15.2)

## 2021-08-22 LAB — CREATININE, URINE, RANDOM: Creatinine, Urine: 50.56 mg/dL

## 2021-08-22 LAB — PROCALCITONIN: Procalcitonin: 0.1 ng/mL

## 2021-08-22 LAB — C DIFFICILE QUICK SCREEN W PCR REFLEX
C Diff antigen: NEGATIVE
C Diff interpretation: NOT DETECTED
C Diff toxin: NEGATIVE

## 2021-08-22 LAB — GLUCOSE, CAPILLARY: Glucose-Capillary: 118 mg/dL — ABNORMAL HIGH (ref 70–99)

## 2021-08-22 LAB — LIPASE, BLOOD: Lipase: 28 U/L (ref 11–51)

## 2021-08-22 MED ORDER — SODIUM CHLORIDE 0.9 % IV SOLN
500.0000 mg | Freq: Every day | INTRAVENOUS | Status: DC
  Administered 2021-08-22: 500 mg via INTRAVENOUS
  Filled 2021-08-22 (×2): qty 5

## 2021-08-22 MED ORDER — EZETIMIBE 10 MG PO TABS
10.0000 mg | ORAL_TABLET | Freq: Every day | ORAL | Status: DC
  Administered 2021-08-22 – 2021-08-24 (×3): 10 mg via ORAL
  Filled 2021-08-22 (×3): qty 1

## 2021-08-22 MED ORDER — ASPIRIN 81 MG PO TBEC
81.0000 mg | DELAYED_RELEASE_TABLET | Freq: Every day | ORAL | Status: DC
  Administered 2021-08-22 – 2021-08-24 (×3): 81 mg via ORAL
  Filled 2021-08-22 (×3): qty 1

## 2021-08-22 MED ORDER — METOPROLOL SUCCINATE ER 25 MG PO TB24
12.5000 mg | ORAL_TABLET | Freq: Every day | ORAL | Status: DC
  Administered 2021-08-22 – 2021-08-24 (×3): 12.5 mg via ORAL
  Filled 2021-08-22 (×3): qty 1

## 2021-08-22 MED ORDER — LACTATED RINGERS IV SOLN: INTRAVENOUS | Status: AC

## 2021-08-22 MED ORDER — HEPARIN (PORCINE) 25000 UT/250ML-% IV SOLN
1000.0000 [IU]/h | INTRAVENOUS | Status: DC
  Administered 2021-08-22: 1000 [IU]/h via INTRAVENOUS
  Filled 2021-08-22: qty 250

## 2021-08-22 MED ORDER — CLONAZEPAM 0.5 MG PO TABS
0.5000 mg | ORAL_TABLET | Freq: Two times a day (BID) | ORAL | Status: DC | PRN
  Administered 2021-08-22 – 2021-08-23 (×3): 0.5 mg via ORAL
  Filled 2021-08-22 (×3): qty 1

## 2021-08-22 MED ORDER — POTASSIUM CHLORIDE CRYS ER 20 MEQ PO TBCR
20.0000 meq | EXTENDED_RELEASE_TABLET | Freq: Every day | ORAL | Status: DC
  Administered 2021-08-22 – 2021-08-24 (×2): 20 meq via ORAL
  Filled 2021-08-22 (×4): qty 1

## 2021-08-22 MED ORDER — ACETAMINOPHEN 650 MG RE SUPP
650.0000 mg | Freq: Four times a day (QID) | RECTAL | Status: DC | PRN
Start: 2021-08-22 — End: 2021-08-24

## 2021-08-22 MED ORDER — MAGNESIUM SULFATE 2 GM/50ML IV SOLN
2.0000 g | Freq: Once | INTRAVENOUS | Status: AC
  Administered 2021-08-22: 2 g via INTRAVENOUS
  Filled 2021-08-22: qty 50

## 2021-08-22 MED ORDER — QUETIAPINE FUMARATE 50 MG PO TABS
50.0000 mg | ORAL_TABLET | Freq: Every day | ORAL | Status: DC
  Administered 2021-08-22: 50 mg via ORAL
  Filled 2021-08-22: qty 1

## 2021-08-22 MED ORDER — PERFLUTREN LIPID MICROSPHERE
1.0000 mL | INTRAVENOUS | Status: AC | PRN
  Administered 2021-08-22: 3 mL via INTRAVENOUS

## 2021-08-22 MED ORDER — ACETAMINOPHEN 325 MG PO TABS
650.0000 mg | ORAL_TABLET | Freq: Four times a day (QID) | ORAL | Status: DC | PRN
Start: 2021-08-22 — End: 2021-08-24

## 2021-08-22 MED ORDER — ONDANSETRON HCL 4 MG/2ML IJ SOLN: 4.0000 mg | Freq: Four times a day (QID) | INTRAMUSCULAR | Status: DC | PRN

## 2021-08-22 MED ORDER — INSULIN ASPART 100 UNIT/ML IJ SOLN: 0.0000 [IU] | Freq: Three times a day (TID) | INTRAMUSCULAR | Status: DC

## 2021-08-22 MED ORDER — SODIUM CHLORIDE 0.9 % IV SOLN
1.0000 g | Freq: Every day | INTRAVENOUS | Status: DC
  Administered 2021-08-22 – 2021-08-24 (×3): 1 g via INTRAVENOUS
  Filled 2021-08-22 (×3): qty 10

## 2021-08-22 MED ORDER — HEPARIN BOLUS VIA INFUSION
4000.0000 [IU] | Freq: Once | INTRAVENOUS | Status: AC
  Administered 2021-08-22: 4000 [IU] via INTRAVENOUS
  Filled 2021-08-22: qty 4000

## 2021-08-22 NOTE — Consult Note (Signed)
Cardiology Consultation:   Patient ID: [name] MRN: [id number]; DOB: 11/11/1950  Admit date: 08/22/2021 Date of Consult: 08/22/2021  PCP:  Martinique, Betty G, MD   [facility] Providers Cardiologist:  Dorris Carnes, MD        Patient Profile:   [name] is a 71 y.o. female with a hx of CAD s/p LHC (Dec 2022 - subtotal mid LAD, complex LCX-not amenable for PCI, was medically mx and followed CTSx for possible CABG), HTN, HLD, Palpitations/PVCs, Dm-2, PTSD who is being seen 08/22/2021 for the evaluation of elevated troponin at the request of ER doc.  History of Present Illness:   [name] is a 71 y.o. female with a hx of CAD s/p LHC (Dec 2022 - subtotal mid LAD, complex LCX-not amenable for PCI, was medically mx and followed CTSx for possible CABG), HTN, HLD, Palpitations/PVCs, Dm-2, PTSD who is being seen 08/22/2021 for the evaluation of elevated troponin, however, she presented with c/o nausea, vomiting and diarrhea  The patient reports 4 days of nausea, vomiting, diarrhea.  She notes that the nausea is been intermittent in nature, but is resulted in at least 2-3 daily episodes of nonbloody, nonbilious emesis, with most recent such episode occurring just prior to presenting to [facility] emergency department on 08/21/2021.  She notes that this is also been associated with at least 3-4 daily episodes of watery, nonbloody loose stool over that timeframe, with most recent episode occurring at [facility] emergency department.  Denies any associated abdominal pain or preceding trauma.    She denies any chest pain or CHF symptoms EKG  NSR with ventricular bigeminy Trop at OSH was 700, here it is 86, BNP is 172  In the setting of her nausea, vomiting, diarrhea over the last 4 days, the patient presented to [facility] ED on 08/21/2021, with evaluation at that time reportedly notable for troponin of 786, serum creatinine 1.3 relative to reported baseline of 0.9.   While at [facility] ED, she received a 500 cc normal saline bolus as well as full dose aspirin. she subsequently underwent ED to ED transfer, transferring to [facility] emergency department for elevated troponin eval.   Patient had cardiac catheterization in December which showed chronic occlusion of the LAD and mild disease of a codominant RCA and moderate-severe disease of the circumflex. Her echo showed mild-moderate anterior hypokinesia with EF of about 45%.  The patient's mother had undergone CABG several years ago and she was emotionally unprepared to undergo surgery.  Her cardiologist felt the circumflex disease could not be managed with PCI.  Cardiac work up: LHC: Dec 2022   Ost LAD to Prox LAD lesion is 40% stenosed.   Mid LAD lesion is 99% stenosed.   1st Diag lesion is 90% stenosed.   1st Mrg lesion is 80% stenosed.   2nd Mrg lesion is 90% stenosed.   Mid Cx lesion is 80% stenosed.   LV end diastolic pressure is normal.   There is no aortic valve stenosis.   Severe obstructive CAD with left dominant circulation Subtotal mid LAD stenosis Complex LCx disease with severe stenoses involving bifurcation of 4 branches of the LCx. Normal LVEDP   Plan: needs referral to CT surgery for CABG. The LCx disease is not amenable to PCI.   ECHO: 03/08/21 IMPRESSIONS     1. Left ventricular ejection fraction, by estimation, is 45 to 50%. The  left ventricle has mildly decreased function. The left ventricle  demonstrates  regional wall motion abnormalities (see scoring  diagram/findings for description). There is mild left  ventricular hypertrophy. Left ventricular diastolic parameters are  indeterminate.   2. Right ventricular systolic function is normal. The right ventricular  size is normal. There is normal pulmonary artery systolic pressure. The  estimated right ventricular systolic pressure is 44.8 mmHg.   3. Left atrial size was mildly dilated.   4. The mitral valve is normal in  structure. Trivial mitral valve  regurgitation.   5. The aortic valve is tricuspid. Aortic valve regurgitation is not  visualized. Aortic valve sclerosis/calcification is present, without any  evidence of aortic stenosis.   6. The inferior vena cava is dilated in size with >50% respiratory  variability, suggesting right atrial pressure of 8 mmHg.   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[place])    Anxiety    Arthritis    Diabetes mellitus without complication (HCC)    GERD (gastroesophageal reflux disease)    Heart murmur    Hyperlipidemia    Hypertension    Neuromuscular disorder (HCC)    Palpitations    Pneumonia    PONV (postoperative nausea and vomiting)    PTSD (post-traumatic stress disorder)    Ringing of ears, left     Past Surgical History:  Procedure Laterality Date   ABDOMINAL HYSTERECTOMY     partial   ACHILLES TENDON SURGERY Left    APPENDECTOMY     BACK SURGERY     BLADDER SUSPENSION     BREAST SURGERY Right 2003   milk duct removed   CHOLECYSTECTOMY     FRACTURE SURGERY     collar bone   LEFT HEART CATH AND CORONARY ANGIOGRAPHY N/A 03/08/2021   Procedure: LEFT HEART CATH AND CORONARY ANGIOGRAPHY;  Surgeon: Martinique, Peter M, MD;  Location: [facility];  Service: Cardiovascular;  Laterality: N/A;   NOSE SURGERY     x 2    OTHER SURGICAL HISTORY     Breast Duct removed   PARTIAL HYSTERECTOMY     RECTOCELE REPAIR     TONSILLECTOMY     VASCULAR SURGERY Right    right leg   VEIN SURGERY Right    leg     Home Medications:  Prior to Admission medications   Medication Sig Start Date End Date Taking? Authorizing Provider  ACCU-CHEK SOFTCLIX LANCETS lancets Use to test blood sugar 3 times daily. Dx: E11.8 08/11/17   Martinique, Betty G, MD  albuterol (VENTOLIN HFA) 108 (90 Base) MCG/ACT inhaler INHALE 2 PUFFS INTO THE LUNGS EVERY 6 HOURS AS NEEDED FOR WHEEZING OR SHORTNESS OF BREATH 01/02/21   Martinique, Betty G, MD  Alcohol Swabs (B-D  SINGLE USE SWABS REGULAR) PADS Use to test blood sugar 3 times daily 08/11/17   Martinique, Betty G, MD  aspirin EC 81 MG tablet Take 81 mg by mouth daily. Swallow whole.    [provider]  Blood Glucose Calibration (ACCU-CHEK AVIVA) SOLN Use as directed 08/11/17   Martinique, Betty G, MD  Blood Glucose Monitoring Suppl (ACCU-CHEK AVIVA PLUS) w/Device KIT Use to check blood sugar 3 times daily 07/22/18   Martinique, Betty G, MD  clonazePAM (KLONOPIN) 1 MG tablet TAKE 1 TABLET(1 MG) BY MOUTH TWICE DAILY AS NEEDED FOR ANXIETY 07/11/21   Martinique, Betty G, MD  Dulaglutide (TRULICITY) 1.5 JE/5.6DJ SOPN Inject 1.5 mg into the skin once a week. 04/09/21   Martinique, Betty G, MD  ezetimibe (ZETIA) 10 MG  tablet Take 1 tablet (10 mg total) by mouth daily. 01/30/21   Martinique, Betty G, MD  fluticasone [facility]) 50 MCG/ACT nasal spray Place 2 sprays into both nostrils daily as needed for allergies or rhinitis. 07/31/21   Martinique, Betty G, MD  gabapentin (NEURONTIN) 600 MG tablet Take 1 tablet (600 mg total) by mouth in the morning, at noon, in the evening, and at bedtime. 06/10/21   Bayard Hugger, NP  glucose blood (ACCU-CHEK AVIVA) test strip Use to test blood sugar 3 times daily. Dx: 11.8 08/11/17   Martinique, Betty G, MD  hydrochlorothiazide (HYDRODIURIL) 25 MG tablet TAKE 1 TABLET BY MOUTH EVERY DAY 04/17/21   Martinique, Betty G, MD  HYDROcodone-acetaminophen (NORCO) 7.5-325 MG tablet Take 1 tablet by mouth every 6 (six) hours as needed for moderate pain. 08/09/21   Bayard Hugger, NP  ipratropium (ATROVENT) 0.06 % nasal spray Place 2 sprays into both nostrils 4 (four) times daily. 11/02/20   Martinique, Betty G, MD  Magnesium 250 MG TABS Take 250 mg by mouth daily.    [provider]  metoprolol succinate (TOPROL XL) 25 MG 24 hr tablet Take 0.5 tablets (12.5 mg total) by mouth at bedtime. 04/10/21   Strader, Fransisco Hertz, PA-C  omeprazole (PRILOSEC) 40 MG capsule Take 1 capsule (40 mg total) by mouth daily. 02/08/21   Martinique, Betty G,  MD  ondansetron (ZOFRAN-ODT) 4 MG disintegrating tablet Take 4 mg by mouth daily as needed for nausea/vomiting. 03/04/21   [provider]  potassium chloride SA (KLOR-CON M) 20 MEQ tablet TAKE 1 TABLET BY MOUTH EVERY DAY 08/13/21   Martinique, Betty G, MD  QUEtiapine (SEROQUEL) 100 MG tablet Take 0.5 tablets (50 mg total) by mouth at bedtime. 05/29/21   Martinique, Betty G, MD  triamcinolone (KENALOG) 0.1 % paste Use as directed 1 application. in the mouth or throat 2 (two) times daily. 07/02/21   Martinique, Betty G, MD    Inpatient Medications: Scheduled Meds:  aspirin EC  81 mg Oral Daily   ezetimibe  10 mg Oral Daily   insulin aspart  0-6 Units Subcutaneous TID WC   metoprolol succinate  12.5 mg Oral Daily   potassium chloride SA  20 mEq Oral Daily   Continuous Infusions:  lactated ringers     magnesium sulfate bolus IVPB     PRN Meds: acetaminophen **OR** acetaminophen, clonazePAM, ondansetron (ZOFRAN) IV  Allergies:    Allergies  Allergen Reactions   Hydromorphone Nausea And Vomiting and Other (See Comments)    Made the patient feel "spaced out,"     Morphine And Related Itching    With high doses   Other Itching   Penicillins Rash    Has patient had a PCN reaction causing immediate rash, facial/tongue/throat swelling, SOB or lightheadedness with hypotension: Yes Has patient had a PCN reaction causing severe rash involving mucus membranes or skin necrosis: No Has patient had a PCN reaction that required hospitalization: No Has patient had a PCN reaction occurring within the last 10 years: No If all of the above answers are "NO", then may proceed with Cephalosporin use.     Social History:   Social History   Socioeconomic History   Marital status: Divorced    Spouse name: Not on file   Number of children: Not on file   Years of education: Not on file   Highest education level: Not on file  Occupational History   Not on file  Tobacco Use  Smoking status: Never    Smokeless tobacco: Never  Vaping Use   Vaping Use: Never used  Substance and Sexual Activity   Alcohol use: No   Drug use: No   Sexual activity: Not on file  Other Topics Concern   Not on file  Social History Narrative   Not on file   Social Determinants of Health   Financial Resource Strain: Low Risk    Difficulty of Paying Living Expenses: Not hard at all  Food Insecurity: No Food Insecurity   Worried About Charity fundraiser in the Last Year: Never true   [place] in the Last Year: Never true  Transportation Needs: No Transportation Needs   Lack of Transportation (Medical): No   Lack of Transportation (Non-Medical): No  Physical Activity: Insufficiently Active   Days of Exercise per Week: 2 days   Minutes of Exercise per Session: 40 min  Stress: No Stress Concern Present   Feeling of Stress : Not at all  Social Connections: Moderately Integrated   Frequency of Communication with Friends and Family: Twice a week   Frequency of Social Gatherings with Friends and Family: Twice a week   Attends Religious Services: More than 4 times per year   Active Member of [employer] or Organizations: Yes   Attends Archivist Meetings: 1 to 4 times per year   Marital Status: Widowed  Human resources officer Violence: Not At Risk   Fear of Current or Ex-Partner: No   Emotionally Abused: No   Physically Abused: No   Sexually Abused: No    Family History:    Family History  Problem Relation Age of Onset   Diabetes Mother    Alzheimer's disease Mother    Lung cancer Father    Esophageal cancer Brother    Colon cancer Neg Hx      ROS:  Please see the history of present illness.   All other ROS reviewed and negative.     Physical Exam/Data:   Vitals:   08/22/21 0143 08/22/21 0300  BP: (!) 113/52 118/69  Pulse: 72 60  Resp: 18 16  Temp: 97.9 F (36.6 C)   TempSrc: Oral   SpO2: 93% 96%    Intake/Output Summary (Last 24 hours) at 08/22/2021 0518 Last data filed at  08/22/2021 0358 Gross per 24 hour  Intake 46.12 ml  Output --  Net 46.12 ml      08/09/2021    3:11 PM 07/15/2021    3:56 PM 06/10/2021    2:50 PM  Last 3 Weights  Weight (lbs) 183 lb 181 lb 3.2 oz 183 lb  Weight (kg) 83.008 kg 82.192 kg 83.008 kg     There is no height or weight on file to calculate BMI.  General:  Well nourished, well developed, in no acute distress HEENT: normal Neck: no JVD Vascular: No carotid bruits; Distal pulses 2+ bilaterally Cardiac:  normal S1, S2; RRR; no murmur  Lungs:  clear to auscultation bilaterally, no wheezing, rhonchi or rales  Abd: soft, nontender, no hepatomegaly  Ext: no edema Musculoskeletal:  No deformities, BUE and BLE strength normal and equal Skin: warm and dry  Neuro:  CNs 2-12 intact, no focal abnormalities noted Psych:  Normal affect    Laboratory Data:  High Sensitivity Troponin:   Recent Labs  Lab 08/22/21 0212  TROPONINIHS 86*     Chemistry Recent Labs  Lab 08/22/21 0212  NA 138  K 3.2*  CL 103  CO2 26  GLUCOSE 107*  BUN 21  CREATININE 1.21*  CALCIUM 8.8*  MG 1.9  GFRNONAA 49*  ANIONGAP 9    Recent Labs  Lab 08/22/21 0212  PROT 6.6  ALBUMIN 3.7  AST 20  ALT 17  ALKPHOS 53  BILITOT 1.0   Lipids No results for input(s): CHOL, TRIG, HDL, LABVLDL, LDLCALC, CHOLHDL in the last 168 hours.  Hematology Recent Labs  Lab 08/22/21 0212  WBC 6.1  RBC 3.99  HGB 12.7  HCT 36.9  MCV 92.5  MCH 31.8  MCHC 34.4  RDW 12.1  PLT 165   Thyroid No results for input(s): TSH, FREET4 in the last 168 hours.  BNP Recent Labs  Lab 08/22/21 0212  BNP 172.3*    DDimer No results for input(s): DDIMER in the last 168 hours.   Radiology/Studies:  DG Chest Portable 1 View  Result Date: 08/22/2021 CLINICAL DATA:  Chest pain EXAM: PORTABLE CHEST 1 VIEW COMPARISON:  08/21/2021 FINDINGS: Cardiac shadow is stable. Aortic calcifications are noted. The lungs are well aerated bilaterally. Patchy airspace opacity is noted  particularly in the right upper lobe new from the prior exam. No sizable effusion is seen. No bony abnormality is noted. IMPRESSION: Increasing right upper lobe airspace opacity. Electronically Signed   By: Inez Catalina M.D.   On: 08/22/2021 02:36     Assessment and Plan:   Elevated troponin/Type 2 MI N/V/Diarrhea/Dehydhration- unclear etiology Possible Pneumonia CAD (Dec 2022 - subtotal mid LAD, complex LCX-not amenable for PCI, was medically mx and followed CTSx for possible CABG) Ischemic CMP, LVEF 40-45% AKI, hypokalemia from GI lossess PTSD, HTN HLD  Plan: - her presentation of n,v,diarrhea is suspicious for occult infection and has right LL opacity?PNA- could be atypical PNA and has no chest pain. She has known severe CAD and possibly expect some demand ischemia in the setting of dehydration and illness. This is not ACS. D/c heparin gtt  - continue aspirin, metoprool, atorvastatin and other home medications -investigate for any infectious causes - ECHO In am  -continue medical mx for CAD -will follow along today and review ECHO    Risk Assessment/Risk Scores:                For questions or updates, please contact [place] Please consult [email or web address] for contact info under    Signed, Renae Fickle, MD  08/22/2021 5:18 AM

## 2021-08-22 NOTE — Progress Notes (Signed)
Care started prior to midnight in the emergency room and patient was admitted early this morning after midnight by Dr. Babs Bertin and I am in current agreement with his assessment and plan.  Additional changes to the plan of care been made accordingly.  The patient is a 71 year old female with a past medical history significant for but not limited to CAD, generalized anxiety disorder, diabetes mellitus type 2, hypertension, hyperlipidemia, chronic systolic CHF as well as other comorbidities who presented with chief complaint of nausea, vomiting and diarrhea.  Incidentally at [facility] patient noted to have elevated troponin and so she was transferred to [facility] for concern for an NSTEMI.  She reports 4 days of nausea vomiting and diarrhea and states that nausea is been intermittent in nature but she had 2-3 daily episodes of nonbloody nonbilious emesis with recent occurring episode just prior to presenting to the ED department at [facility].  She also states that she has had 3-4 daily bowel movements of watery but dark color black tarry stool and denies any abdominal pain.  She did have some chest discomfort but after she was placed on antibiotics for chest discomfort has improved and she is able to breathe easily without any issues.  She was transferred to [name] given her history of CAD and most recent coronary angiography in December 2022 revealing multivessel CAD was not amenable to PCI.  Currently undergoing medical management has been on daily aspirin and metoprolol succinate.  She follows with [name] of [facility] cardiology and most recent echocardiogram showed an EF of 40 to 45%.  At [facility] she had a notable elevated troponin of 786 but here is only elevated 86 and then trended down to 88.  Echocardiogram is being repeated today but cardiology here evaluated and felt that she had a minimal elevation with a flat trend and she denies any current chest clear now.  They felt that her  elevated troponin is not consistent with ACS and likely demand ischemia in the setting of dehydration given her vomiting and diarrhea as well as AKI and acute infection.  Her heparin drip has now been stopped.  Cardiology is following closely and they feel the patient is euvolemic now and recommending holding her diuretics given her AKI.  Cardiology feels that she would ideally be on GDMT more than just metoprolol but negative consider starting ARB and ARNI this admission if EF is still reduced and AKI improved.  Given her black stools we have notified GI for further evaluation recommendations.  Currently she is being admitted and treated for the following but not limited to:  Elevated Troponin  Dark Tarry Stools with concern for GIB  Nausea, Vomiting, and Diarrhea  Hypokalemia  AKI, improving   CAP  GAD  Type 2 Diabetes Mellitus  GERD/GI Prophylaxis  Essential HTN  HLD  Chronic Systolic CHF  We will continue to monitor and trend the patient's clinical response to intervention and follow-up on specialist recommendations

## 2021-08-22 NOTE — Assessment & Plan Note (Signed)
  #)   Community-acquired pneumonia: Diagnosis suspected on basis of 4 days of new onset shortness of breath associated with cough, with chest x-ray showing interval development of right upper lobe airspace opacity.  Of note, no SIRS criteria met at this time.  Therefore criteria for sepsis not currently met.  While patient has some documentation of the history of allergic reaction to penicillin, inpatient pharmacist as conveyed to me that she has safely received cephalosporins on multiple occasions.  Consequently, we will initiate azithromycin/Rocephin as empiric coverage for for community-acquired pneumonia.  will also further assess by checking procalcitonin level.  Given concomitant GI symptoms, will also check for Legionella, as below.   Plan: Blood cultures x2 followed by initiation of Rocephin and azithromycin.  Add on procalcitonin level.  Flutter valve.  Incentive spirometry.  Check serum phosphorus level.  Repeat CMP in the morning.  Check legionella and strep pneumoniae urine antigens.

## 2021-08-22 NOTE — Assessment & Plan Note (Signed)
   #)   Generalized anxiety disorder: Documented history of such, on as needed clonazepam as an outpatient, in the absence of any scheduled SSRI or SNRI.  Plan: Continue outpatient as needed clonazepam.

## 2021-08-22 NOTE — ED Notes (Signed)
First blood culture drawn prior to starting abx.

## 2021-08-22 NOTE — Assessment & Plan Note (Signed)
  #)   Hyperlipidemia: documented h/o such. On Zetia as outpatient.    Plan: continue home Zetia .

## 2021-08-22 NOTE — Assessment & Plan Note (Signed)
 #)   Type 2 Diabetes Mellitus: documented history of such. Home insulin regimen: (none). On Trulicity as outpatient. presenting blood sugar:  107.  Appears to be complicated by diabetic peripheral polyneuropathy, with the patient also on scheduled gabapentin at home.  Per chart review, there is also documentation of diabetic gastroparesis.   Plan: accuchecks QAC and HS with low dose SSI.  Hold home Trulicity as well as gabapentin in the setting of AKI.

## 2021-08-22 NOTE — Progress Notes (Signed)
ANTICOAGULATION CONSULT NOTE - Initial Consult  Pharmacy Consult for Heparin Indication: chest pain/ACS  Allergies  Allergen Reactions   Hydromorphone Nausea And Vomiting and Other (See Comments)    Made the patient feel "spaced out,"     Morphine And Related Itching    With high doses   Other Itching   Penicillins Rash    Has patient had a PCN reaction causing immediate rash, facial/tongue/throat swelling, SOB or lightheadedness with hypotension: Yes Has patient had a PCN reaction causing severe rash involving mucus membranes or skin necrosis: No Has patient had a PCN reaction that required hospitalization: No Has patient had a PCN reaction occurring within the last 10 years: No If all of the above answers are "NO", then may proceed with Cephalosporin use.     Patient Measurements:    Vital Signs: Temp: 97.9 F (36.6 C) (05/18 0143) Temp Source: Oral (05/18 0143) BP: 113/52 (05/18 0143) Pulse Rate: 72 (05/18 0143)  Labs: No results for input(s): HGB, HCT, PLT, APTT, LABPROT, INR, HEPARINUNFRC, HEPRLOWMOCWT, CREATININE, CKTOTAL, CKMB, TROPONINIHS in the last 72 hours.  CrCl cannot be calculated (Patient's most recent lab result is older than the maximum 21 days allowed.).   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   Current Outpatient Medications on File Prior to Encounter  Medication Sig Dispense Refill   ACCU-CHEK SOFTCLIX LANCETS lancets Use to test blood sugar 3 times daily. Dx: E11.8 100 each  12   albuterol (VENTOLIN HFA) 108 (90 Base) MCG/ACT inhaler INHALE 2 PUFFS INTO THE LUNGS EVERY 6 HOURS AS NEEDED FOR WHEEZING OR SHORTNESS OF BREATH 18 g 2   Alcohol Swabs (B-D SINGLE USE SWABS REGULAR) PADS Use to test blood sugar 3 times daily 100 each 3   aspirin EC 81 MG tablet Take 81 mg by mouth daily. Swallow whole.     Blood Glucose Calibration (ACCU-CHEK AVIVA) SOLN Use as directed 1 each 2   Blood Glucose Monitoring Suppl (ACCU-CHEK AVIVA PLUS) w/Device KIT Use to check blood sugar 3 times daily 1 kit 1   clonazePAM (KLONOPIN) 1 MG tablet TAKE 1 TABLET(1 MG) BY MOUTH TWICE DAILY AS NEEDED FOR ANXIETY 60 tablet 3   Dulaglutide (TRULICITY) 1.5 MW/4.1LK SOPN Inject 1.5 mg into the skin once a week. 13 mL 3   ezetimibe (ZETIA) 10 MG tablet Take 1 tablet (10 mg total) by mouth daily. 30 tablet 3   fluticasone (FLONASE) 50 MCG/ACT nasal spray Place 2 sprays into both nostrils daily as needed for allergies or rhinitis. 16 g 3   gabapentin (NEURONTIN) 600 MG tablet Take 1 tablet (600 mg total) by mouth in the morning, at noon, in the evening, and at bedtime. 120 tablet 3   glucose blood (ACCU-CHEK AVIVA) test strip Use to test blood sugar 3 times daily. Dx: 11.8 100 each 12   hydrochlorothiazide (HYDRODIURIL) 25 MG tablet TAKE 1 TABLET BY MOUTH EVERY DAY 90 tablet 3  HYDROcodone-acetaminophen (NORCO) 7.5-325 MG tablet Take 1 tablet by mouth every 6 (six) hours as needed for moderate pain. 120 tablet 0   ipratropium (ATROVENT) 0.06 % nasal spray Place 2 sprays into both nostrils 4 (four) times daily. 15 mL 3   Magnesium 250 MG TABS Take 250 mg by mouth daily.     metoprolol succinate (TOPROL XL) 25 MG 24 hr tablet Take 0.5 tablets (12.5 mg total) by mouth at bedtime. 45 tablet 3   omeprazole (PRILOSEC) 40 MG capsule Take 1 capsule (40 mg total) by mouth daily. 90 capsule 2   ondansetron (ZOFRAN-ODT) 4 MG disintegrating tablet Take 4 mg by mouth daily as needed for nausea/vomiting.     potassium  chloride SA (KLOR-CON M) 20 MEQ tablet TAKE 1 TABLET BY MOUTH EVERY DAY 30 tablet 3   QUEtiapine (SEROQUEL) 100 MG tablet Take 0.5 tablets (50 mg total) by mouth at bedtime. 45 tablet 3   triamcinolone (KENALOG) 0.1 % paste Use as directed 1 application. in the mouth or throat 2 (two) times daily. 5 g 1     Assessment: 71 y.o. female with abdominal pain, elevated troponin for heparin  Goal of Therapy:  Heparin level 0.3-0.7 units/ml Monitor platelets by anticoagulation protocol: Yes   Plan:  Heparin 4000 units IV bolus, then start heparin 1000 units/hr Check heparin level in 8 hours.   [name], [name] 08/22/2021,2:50 AM

## 2021-08-22 NOTE — ED Provider Notes (Signed)
[facility] EMERGENCY DEPARTMENT Provider Note   CSN: [id number] Arrival date & time: 08/22/21  7829     History  Chief Complaint  Patient presents with   Abdominal Pain   NSTEMI    [name] is a 71 y.o. female who presents in transfer from [facility] for NSTEMI with troponin of 779 and BNP of 1121; INR 1.08 at 21:46 on 08/21/21.  Patient with known history of untreated CAD; was referred to cardiothoracic surgery Dr. Darcey Nora in December 2022 but has not yet undergone surgical intervention for her multivessel disease that was not amenable to PCI. Follows with [facility].   She initially presented to the emergency department at [facility] this evening due to 4 days of ongoing generalized malaise, lightheadedness, weakness, and nausea and diarrhea.  She endorses profuse watery diarrhea and generally feeling very unwell.  Was found to have hyponatremia to 133, hypokalemia 3.2, AKI with creatinine of 1.3 elevated from her baseline of 0.9.  She did receive a 500 cc bolus in route, aspirin.   LEVEL 5 CAVEAT DUE TO ACUITY OF PRESENTATION UPON ARRIVAL.   I personally reviewed this patient's medical records.  In addition to the above listed concerns she is history of type 2 diabetes, hypercholesterolemia, heart murmur, and anxiety.  She is not anticoagulated. LVEF of 45%.   HPI     Home Medications Prior to Admission medications   Medication Sig Start Date End Date Taking? Authorizing Provider  ACCU-CHEK SOFTCLIX LANCETS lancets Use to test blood sugar 3 times daily. Dx: E11.8 08/11/17   [place], Betty G, MD  albuterol (VENTOLIN HFA) 108 (90 Base) MCG/ACT inhaler INHALE 2 PUFFS INTO THE LUNGS EVERY 6 HOURS AS NEEDED FOR WHEEZING OR SHORTNESS OF BREATH 01/02/21   [place], Betty G, MD  Alcohol Swabs (B-D SINGLE USE SWABS REGULAR) PADS Use to test blood sugar 3 times daily 08/11/17   [place], Betty G, MD  aspirin EC 81 MG tablet Take 81 mg by mouth daily. Swallow  whole.    [provider]  Blood Glucose Calibration (ACCU-CHEK AVIVA) SOLN Use as directed 08/11/17   [place], Betty G, MD  Blood Glucose Monitoring Suppl (ACCU-CHEK AVIVA PLUS) w/Device KIT Use to check blood sugar 3 times daily 07/22/18   [place], Betty G, MD  clonazePAM (KLONOPIN) 1 MG tablet TAKE 1 TABLET(1 MG) BY MOUTH TWICE DAILY AS NEEDED FOR ANXIETY 07/11/21   [place], Betty G, MD  Dulaglutide (TRULICITY) 1.5 FA/2.1HY SOPN Inject 1.5 mg into the skin once a week. 04/09/21   [place], Betty G, MD  ezetimibe (ZETIA) 10 MG tablet Take 1 tablet (10 mg total) by mouth daily. 01/30/21   [place], Betty G, MD  fluticasone [facility]) 50 MCG/ACT nasal spray Place 2 sprays into both nostrils daily as needed for allergies or rhinitis. 07/31/21   [place], Betty G, MD  gabapentin (NEURONTIN) 600 MG tablet Take 1 tablet (600 mg total) by mouth in the morning, at noon, in the evening, and at bedtime. 06/10/21   Bayard Hugger, NP  glucose blood (ACCU-CHEK AVIVA) test strip Use to test blood sugar 3 times daily. Dx: 11.8 08/11/17   [place], Betty G, MD  hydrochlorothiazide (HYDRODIURIL) 25 MG tablet TAKE 1 TABLET BY MOUTH EVERY DAY 04/17/21   [place], Betty G, MD  HYDROcodone-acetaminophen (NORCO) 7.5-325 MG tablet Take 1 tablet by mouth every 6 (six) hours as needed for moderate pain. 08/09/21   Bayard Hugger, NP  ipratropium (ATROVENT) 0.06 %  nasal spray Place 2 sprays into both nostrils 4 (four) times daily. 11/02/20   [place], Betty G, MD  Magnesium 250 MG TABS Take 250 mg by mouth daily.    [provider]  metoprolol succinate (TOPROL XL) 25 MG 24 hr tablet Take 0.5 tablets (12.5 mg total) by mouth at bedtime. 04/10/21   Strader, Fransisco Hertz, PA-C  omeprazole (PRILOSEC) 40 MG capsule Take 1 capsule (40 mg total) by mouth daily. 02/08/21   [place], Betty G, MD  ondansetron (ZOFRAN-ODT) 4 MG disintegrating tablet Take 4 mg by mouth daily as needed for nausea/vomiting. 03/04/21   [provider]   potassium chloride SA (KLOR-CON M) 20 MEQ tablet TAKE 1 TABLET BY MOUTH EVERY DAY 08/13/21   [place], Betty G, MD  QUEtiapine (SEROQUEL) 100 MG tablet Take 0.5 tablets (50 mg total) by mouth at bedtime. 05/29/21   [place], Betty G, MD  triamcinolone (KENALOG) 0.1 % paste Use as directed 1 application. in the mouth or throat 2 (two) times daily. 07/02/21   [place], Betty G, MD      Allergies    Hydromorphone, Morphine and related, Other, and Penicillins    Review of Systems   Review of Systems  Constitutional:  Positive for activity change, appetite change and fatigue.  Respiratory:  Positive for shortness of breath.   Cardiovascular:  Positive for palpitations.  Gastrointestinal:  Positive for diarrhea.  Neurological:  Positive for light-headedness.   Physical Exam Updated Vital Signs BP (!) 113/52 (BP Location: Right Arm)   Pulse 72   Temp 97.9 F (36.6 C) (Oral)   Resp 18   SpO2 93%  Physical Exam Vitals and nursing note reviewed.  Constitutional:      Appearance: She is ill-appearing. She is not toxic-appearing.  HENT:     Head: Normocephalic and atraumatic.     Mouth/Throat:     Mouth: Mucous membranes are moist.     Pharynx: No oropharyngeal exudate or posterior oropharyngeal erythema.  Eyes:     General: Lids are normal. Vision grossly intact.        Right eye: No discharge.        Left eye: No discharge.     Extraocular Movements: Extraocular movements intact.     Conjunctiva/sclera: Conjunctivae normal.     Pupils: Pupils are equal, round, and reactive to light.  Cardiovascular:     Rate and Rhythm: Normal rate and regular rhythm.     Pulses: Normal pulses.     Heart sounds: Murmur heard.    Gallop present.  Pulmonary:     Effort: Pulmonary effort is normal. No tachypnea, bradypnea, accessory muscle usage, prolonged expiration or respiratory distress.     Breath sounds: Normal breath sounds. No wheezing or rales.  Chest:     Chest wall: No mass, lacerations,  deformity, swelling, tenderness, crepitus or edema.  Abdominal:     General: Bowel sounds are normal. There is no distension.     Palpations: Abdomen is soft.     Tenderness: There is no abdominal tenderness. There is no right CVA tenderness, left CVA tenderness, guarding or rebound.  Musculoskeletal:        General: No deformity.     Cervical back: Normal range of motion and neck supple.  Skin:    General: Skin is warm and dry.     Capillary Refill: Capillary refill takes less than 2 seconds.  Neurological:     General: No focal deficit present.  Mental Status: She is alert and oriented to person, place, and time. Mental status is at baseline.  Psychiatric:        Mood and Affect: Mood normal.    ED Results / Procedures / Treatments   Labs (all labs ordered are listed, but only abnormal results are displayed) Labs Reviewed - No data to display  EKG None  Radiology No results found.  Procedures .Critical Care Performed by: Emeline Darling, PA-C Authorized by: Emeline Darling, PA-C   Critical care provider statement:    Critical care time (minutes):  45   Critical care was time spent personally by me on the following activities:  Development of treatment plan with patient or surrogate, discussions with consultants, evaluation of patient's response to treatment, examination of patient, obtaining history from patient or surrogate, ordering and performing treatments and interventions, ordering and review of laboratory studies, ordering and review of radiographic studies, pulse oximetry and re-evaluation of patient's condition    Medications Ordered in ED Medications - No data to display  ED Course/ Medical Decision Making/ A&P Clinical Course as of 08/22/21 0524  Thu Aug 22, 2021  0328 Consult to cardiologist who favors diagnosis of demand ischemia rather than true NSTEMI, in context of patient who is CP free. He recommends discontinuing heparin if troponin is  downtrending from prior 779 value.  Agrees with plan for medical admission.  I appreciate his collaboration in care of this patient. [RS]  4650 Troponin is Downtrending now 43, per cardiology recommendation we will discontinue heparin. [RS]  [phone] Consult to Dr. Velia Meyer, hospitalist, who will admit this patient to his service.  [RS]    Clinical Course User Index [RS] Beren Yniguez, Gypsy Balsam, PA-C                           Medical Decision Making 71 year old female who presents with concern for ? NSTEMI from outside facility.  VS reassuring on intake, Cardiopulmonary exam as above with murmur and gallop. Per patient previously known. Abdominal exam is benign. Neurovascularly intact in all extremities.   Differential for NSTEMI includes but still limited to pleural effusion, dysrhythmia, demand ischemia.  Amount and/or Complexity of Data Reviewed External Data Reviewed: labs, radiology, ECG and notes. Labs: ordered.    Details: CBC unremarkable, CMP kalemia of 3.2-1.2 elevated from patient's baseline of 0.8 BNP mildly elevated 172, troponin elevated to 86 but significantly improved from previously reported 779.  Magnesium is normal, COVID test is negative. Radiology: ordered.  Risk Decision regarding hospitalization.   Per cardiology consult, heparin was discontinued given downtrending troponin from initial value reported by outside facility.  We will still plan to admit patient to hospital for concern for possible demand ischemia in context of gastroenteritis.  Nikoleta voiced understanding of her medical evaluation and treatment plan.  Each of her questions was answered to her expressed satisfaction.  She is amenable to plan for admission at this time.  Consult to hospitalist as above.  This chart was dictated using voice recognition software, Dragon. Despite the best efforts of this provider to proofread and correct errors, errors may still occur which can change documentation  meaning.  Final Clinical Impression(s) / ED Diagnoses Final diagnoses:  None    Rx / DC Orders ED Discharge Orders     None         Emeline Darling, PA-C 08/22/21 0529    Maudie Flakes, MD 08/22/21 [phone]

## 2021-08-22 NOTE — Assessment & Plan Note (Signed)
 #)   Chronic systolic heart failure: documented history of such, with most recent echocardiogram performed in December 2022, with results notable for LVEF 45 to 50% as well as indeterminate diastolic parameters in the absence of any associated valvular pathology. No clinical or radiographic evidence to suggest acutely decompensated heart failure at this time. home diuretic regimen reportedly consists of the following: HCTZ in the absence of any scheduled loop diuretics at home. Home cardiac medications also include the following: Metoprolol succinate in the absence of any ACE inhibitor or ARB.  In the context of suspicion for presenting dehydration, we will proceed with gentle IV fluids, closely monitoring for evidence of ensuing development of acute volume overload, as further detailed below.   Plan: monitor strict I's & O's and daily weights. Repeat BMP in AM. Check serum mag level.  Hold home HCTZ for now.  Continue home beta-blocker.  Repeat echocardiogram has been ordered for the morning to further evaluate presenting NSTEMI.

## 2021-08-22 NOTE — Assessment & Plan Note (Signed)
 #),   Nausea, vomiting, diarrhea: 4 days of these recurrent symptoms, as further quantified above, in the absence of any evidence of GI bleed. Etiology not entirely clear at this time.  Given that the patient has been experiencing at least 3-4 episodes of diarrhea per day over the last 4 days, will also check for C. difficile.  Considered the possibility of viral gastroenteritis, although the absence of any associated abdominal discomfort as well as the duration of the symptoms appear to render this possibility to be less likely.  Differential also includes potential gastroparesis in the setting of a documented history of such versus gastrointestinal sequela of suspected presenting community-acquired pneumonia.  Additionally, the patient is on some medications that can be associated with promotility outcomes, including that of omeprazole.  Will these medications to decrease any confounding contributions that they may pose.   Plan: Check C. difficile, as above.  Gentle IV fluids in the form of lactated Ringer's at 50 cc/h x 8 hours.  Monitor strict I's and O's and daily weights.  Prn Zofran.  Refraining from antimotility agents for now until infectious diarrhea has been ruled out.  Magnesium supplementation, as above.  Further evaluation management of presenting hypokalemia, as further detailed below.  Repeat CMP and CBC in the morning.  Hold omeprazole.  Check lipase.

## 2021-08-22 NOTE — Consult Note (Addendum)
Attending physician's note   I have taken a history, reviewed the chart, and examined the patient. I performed a substantive portion of this encounter, including complete performance of at least one of the key components, in conjunction with the APP. I agree with the APP's note, impression, and recommendations with my edits.   71 year old female with medical history as outlined below, transferred from [place] with concern for NSTEMI in a patient with complex cardiovascular disease.  GI service consulted for evaluation of nausea, vomiting, diarrhea.  GI symptoms started 3 days ago as nausea/vomiting followed by diarrhea.  Emesis lasted about a day, but diarrhea continued with lower abdominal cramping and gurgling.  Then noticed dark black stools yesterday.  Last bowel movement was >12 hours ago, and she reports overall starting to feel better from a GI standpoint in the last day or so.  Clinical presentation seems most consistent with infectious gastroenteritis.  Unsure if she truly had melena vs just dark stools, but thankfully serial H/H stable and no further diarrhea.  Overall, seems to be clinically improving from a GI standpoint.  - Agree with infectious work-up as outlined - Conservative management from a GI standpoint - Do not think she needs any endoscopic evaluation at this juncture - Continue advancing diet as tolerated - Okay to use Bentyl prn if abdominal cramping returns - GI service will sign off at this time.  Please do not hesitate to contact us with additional questions or concerns  Cephas Darby, FACG [phone] office         Referring Provider:  Dr. Alfredia Ferguson, [facility] Primary Care Physician:  Martinique, Betty G, MD Primary Gastroenterologist:  Dr. Silverio Decamp  Reason for Consultation:  GI bleeding  HPI: [name] is a 71 y.o. female with medical history significant for multivessel coronary artery disease being medically managed on ASA at home, GAD,  type 2 diabetes mellitus, hypertension, hyperlipidemia, chronic systolic heart failure, who is admitted to [facility] on 08/22/2021 with concern for NSTEMI after presenting from [facility] ED with complaints of nausea, vomiting, diarrhea, and chest pain.  She says that on Monday she woke up with nausea and vomiting.  Following that the diarrhea began and has lasted several days.  Very frequent stools with incontinence.  She had started becoming weak so she thought she should be evaluated.  She has not had any diarrhea since last night.  She is also complaining of chest pain and had elevated troponins.  She was initially started on heparin drip, but then had 2 episodes of very black/tarry stools so the heparin was discontinued.  Also appears to have possible pneumonia on chest x-ray so started on antibiotics.  Stool studies have been ordered.  Now just having some gurgling and "griping" in her abdomen.  CBC is completely normal with normal white cell count and hemoglobin.  Initially potassium was slightly low, but now electrolytes are completely normal and renal function/BUN was normal.  Colonoscopy 02/2017: - Preparation of the colon was fair. - Non-bleeding internal hemorrhoids. - The examination was otherwise normal. - No specimens collected.  EGD 02/2017: - Normal esophagus. - Gastritis. Biopsied. - Normal examined duodenum.  Surgical [P], gastric antrum and gastric body - MILD CHRONIC GASTRITIS. - NEGATIVE FOR HELICOBACTER PYLORI. - NO INTESTINAL METAPLASIA, DYSPLASIA, OR MALIGNANCY.  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[place])    Anxiety    Arthritis    Diabetes mellitus without complication (  HCC)    GERD (gastroesophageal reflux disease)    Heart murmur    Hyperlipidemia    Hypertension    Neuromuscular disorder (HCC)    Palpitations    Pneumonia    PONV (postoperative nausea and vomiting)    PTSD (post-traumatic stress disorder)    Ringing of  ears, left     Past Surgical History:  Procedure Laterality Date   ABDOMINAL HYSTERECTOMY     partial   ACHILLES TENDON SURGERY Left    APPENDECTOMY     BACK SURGERY     BLADDER SUSPENSION     BREAST SURGERY Right 2003   milk duct removed   CHOLECYSTECTOMY     FRACTURE SURGERY     collar bone   LEFT HEART CATH AND CORONARY ANGIOGRAPHY N/A 03/08/2021   Procedure: LEFT HEART CATH AND CORONARY ANGIOGRAPHY;  Surgeon: Martinique, Peter M, MD;  Location: [facility];  Service: Cardiovascular;  Laterality: N/A;   NOSE SURGERY     x 2    OTHER SURGICAL HISTORY     Breast Duct removed   PARTIAL HYSTERECTOMY     RECTOCELE REPAIR     TONSILLECTOMY     VASCULAR SURGERY Right    right leg   VEIN SURGERY Right    leg    Prior to Admission medications   Medication Sig Start Date End Date Taking? Authorizing Provider  albuterol (VENTOLIN HFA) 108 (90 Base) MCG/ACT inhaler INHALE 2 PUFFS INTO THE LUNGS EVERY 6 HOURS AS NEEDED FOR WHEEZING OR SHORTNESS OF BREATH 01/02/21  Yes Martinique, Betty G, MD  aspirin EC 81 MG tablet Take 81 mg by mouth daily. Swallow whole.   Yes [provider]  clonazePAM (KLONOPIN) 1 MG tablet TAKE 1 TABLET(1 MG) BY MOUTH TWICE DAILY AS NEEDED FOR ANXIETY 07/11/21  Yes Martinique, Betty G, MD  Dulaglutide (TRULICITY) 1.5 QX/4.5WT SOPN Inject 1.5 mg into the skin once a week. 04/09/21  Yes Martinique, Betty G, MD  fluticasone [facility]) 50 MCG/ACT nasal spray Place 2 sprays into both nostrils daily as needed for allergies or rhinitis. 07/31/21  Yes Martinique, Betty G, MD  gabapentin (NEURONTIN) 600 MG tablet Take 1 tablet (600 mg total) by mouth in the morning, at noon, in the evening, and at bedtime. 06/10/21  Yes Bayard Hugger, NP  hydrochlorothiazide (HYDRODIURIL) 25 MG tablet TAKE 1 TABLET BY MOUTH EVERY DAY 04/17/21  Yes Martinique, Betty G, MD  HYDROcodone-acetaminophen (NORCO) 7.5-325 MG tablet Take 1 tablet by mouth every 6 (six) hours as needed for moderate pain. 08/09/21   Yes Bayard Hugger, NP  Magnesium 250 MG TABS Take 250 mg by mouth daily.   Yes [provider]  metoprolol succinate (TOPROL XL) 25 MG 24 hr tablet Take 0.5 tablets (12.5 mg total) by mouth at bedtime. 04/10/21  Yes Strader, [place] M, PA-C  omeprazole (PRILOSEC) 40 MG capsule Take 1 capsule (40 mg total) by mouth daily. 02/08/21  Yes Martinique, Betty G, MD  ondansetron (ZOFRAN-ODT) 4 MG disintegrating tablet Take 4 mg by mouth daily as needed for nausea/vomiting. 03/04/21  Yes [provider]  potassium chloride SA (KLOR-CON M) 20 MEQ tablet TAKE 1 TABLET BY MOUTH EVERY DAY 08/13/21  Yes Martinique, Betty G, MD  QUEtiapine (SEROQUEL) 100 MG tablet Take 0.5 tablets (50 mg total) by mouth at bedtime. 05/29/21  Yes Martinique, Betty G, MD  triamcinolone (KENALOG) 0.1 % paste Use as directed 1 application. in the mouth or throat  2 (two) times daily. 07/02/21  Yes Martinique, Betty G, MD  ACCU-CHEK SOFTCLIX LANCETS lancets Use to test blood sugar 3 times daily. Dx: E11.8 08/11/17   Martinique, Betty G, MD  Alcohol Swabs (B-D SINGLE USE SWABS REGULAR) PADS Use to test blood sugar 3 times daily 08/11/17   Martinique, Betty G, MD  Blood Glucose Calibration (ACCU-CHEK AVIVA) SOLN Use as directed 08/11/17   Martinique, Betty G, MD  Blood Glucose Monitoring Suppl (ACCU-CHEK AVIVA PLUS) w/Device KIT Use to check blood sugar 3 times daily 07/22/18   Martinique, Betty G, MD  ezetimibe (ZETIA) 10 MG tablet Take 1 tablet (10 mg total) by mouth daily. Patient not taking: Reported on 08/22/2021 01/30/21   Martinique, Betty G, MD  glucose blood (ACCU-CHEK AVIVA) test strip Use to test blood sugar 3 times daily. Dx: 11.8 08/11/17   Martinique, Betty G, MD  ipratropium (ATROVENT) 0.06 % nasal spray Place 2 sprays into both nostrils 4 (four) times daily. Patient not taking: Reported on 08/22/2021 11/02/20   Martinique, Betty G, MD    Current Facility-Administered Medications  Medication Dose Route Frequency Provider Last Rate Last Admin   acetaminophen  (TYLENOL) tablet 650 mg  650 mg Oral Q6H PRN Howerter, Justin B, DO       Or   acetaminophen (TYLENOL) suppository 650 mg  650 mg Rectal Q6H PRN Howerter, Justin B, DO       aspirin EC tablet 81 mg  81 mg Oral Daily Howerter, Justin B, DO   81 mg at 08/22/21 0937   azithromycin (ZITHROMAX) 500 mg in sodium chloride 0.9 % 250 mL IVPB  500 mg Intravenous Daily Howerter, Justin B, DO   Stopped at 08/22/21 5277   cefTRIAXone (ROCEPHIN) 1 g in sodium chloride 0.9 % 100 mL IVPB  1 g Intravenous Daily Howerter, Justin B, DO   Stopped at 08/22/21 0735   clonazePAM (KLONOPIN) tablet 0.5 mg  0.5 mg Oral BID PRN Howerter, Justin B, DO       ezetimibe (ZETIA) tablet 10 mg  10 mg Oral Daily Howerter, Justin B, DO   10 mg at 08/22/21 8242   insulin aspart (novoLOG) injection 0-6 Units  0-6 Units Subcutaneous TID WC Howerter, Justin B, DO       metoprolol succinate (TOPROL-XL) 24 hr tablet 12.5 mg  12.5 mg Oral Daily Howerter, Justin B, DO   12.5 mg at 08/22/21 0936   ondansetron (ZOFRAN) injection 4 mg  4 mg Intravenous Q6H PRN Howerter, Justin B, DO       potassium chloride SA (KLOR-CON M) CR tablet 20 mEq  20 mEq Oral Daily Howerter, Justin B, DO   20 mEq at 08/22/21 3536   Current Outpatient Medications  Medication Sig Dispense Refill   albuterol (VENTOLIN HFA) 108 (90 Base) MCG/ACT inhaler INHALE 2 PUFFS INTO THE LUNGS EVERY 6 HOURS AS NEEDED FOR WHEEZING OR SHORTNESS OF BREATH 18 g 2   aspirin EC 81 MG tablet Take 81 mg by mouth daily. Swallow whole.     clonazePAM (KLONOPIN) 1 MG tablet TAKE 1 TABLET(1 MG) BY MOUTH TWICE DAILY AS NEEDED FOR ANXIETY 60 tablet 3   Dulaglutide (TRULICITY) 1.5 RW/4.3XV SOPN Inject 1.5 mg into the skin once a week. 13 mL 3   fluticasone (FLONASE) 50 MCG/ACT nasal spray Place 2 sprays into both nostrils daily as needed for allergies or rhinitis. 16 g 3   gabapentin (NEURONTIN) 600 MG tablet Take 1 tablet (600 mg total) by  mouth in the morning, at noon, in the evening, and at  bedtime. 120 tablet 3   hydrochlorothiazide (HYDRODIURIL) 25 MG tablet TAKE 1 TABLET BY MOUTH EVERY DAY 90 tablet 3   HYDROcodone-acetaminophen (NORCO) 7.5-325 MG tablet Take 1 tablet by mouth every 6 (six) hours as needed for moderate pain. 120 tablet 0   Magnesium 250 MG TABS Take 250 mg by mouth daily.     metoprolol succinate (TOPROL XL) 25 MG 24 hr tablet Take 0.5 tablets (12.5 mg total) by mouth at bedtime. 45 tablet 3   omeprazole (PRILOSEC) 40 MG capsule Take 1 capsule (40 mg total) by mouth daily. 90 capsule 2   ondansetron (ZOFRAN-ODT) 4 MG disintegrating tablet Take 4 mg by mouth daily as needed for nausea/vomiting.     potassium chloride SA (KLOR-CON M) 20 MEQ tablet TAKE 1 TABLET BY MOUTH EVERY DAY 30 tablet 3   QUEtiapine (SEROQUEL) 100 MG tablet Take 0.5 tablets (50 mg total) by mouth at bedtime. 45 tablet 3   triamcinolone (KENALOG) 0.1 % paste Use as directed 1 application. in the mouth or throat 2 (two) times daily. 5 g 1   ACCU-CHEK SOFTCLIX LANCETS lancets Use to test blood sugar 3 times daily. Dx: E11.8 100 each 12   Alcohol Swabs (B-D SINGLE USE SWABS REGULAR) PADS Use to test blood sugar 3 times daily 100 each 3   Blood Glucose Calibration (ACCU-CHEK AVIVA) SOLN Use as directed 1 each 2   Blood Glucose Monitoring Suppl (ACCU-CHEK AVIVA PLUS) w/Device KIT Use to check blood sugar 3 times daily 1 kit 1   ezetimibe (ZETIA) 10 MG tablet Take 1 tablet (10 mg total) by mouth daily. (Patient not taking: Reported on 08/22/2021) 30 tablet 3   glucose blood (ACCU-CHEK AVIVA) test strip Use to test blood sugar 3 times daily. Dx: 11.8 100 each 12   ipratropium (ATROVENT) 0.06 % nasal spray Place 2 sprays into both nostrils 4 (four) times daily. (Patient not taking: Reported on 08/22/2021) 15 mL 3    Allergies as of 08/22/2021 - Review Complete 08/22/2021  Allergen Reaction Noted   Hydromorphone Nausea And Vomiting and Other (See Comments) 12/04/2016   Morphine and related Itching  12/04/2016   Other Itching 12/04/2016   Penicillins Rash 06/16/2014    Family History  Problem Relation Age of Onset   Diabetes Mother    Alzheimer's disease Mother    Lung cancer Father    Esophageal cancer Brother    Colon cancer Neg Hx     Social History   Socioeconomic History   Marital status: Divorced    Spouse name: Not on file   Number of children: Not on file   Years of education: Not on file   Highest education level: Not on file  Occupational History   Not on file  Tobacco Use   Smoking status: Never   Smokeless tobacco: Never  Vaping Use   Vaping Use: Never used  Substance and Sexual Activity   Alcohol use: No   Drug use: No   Sexual activity: Not on file  Other Topics Concern   Not on file  Social History Narrative   Not on file   Social Determinants of Health   Financial Resource Strain: Low Risk    Difficulty of Paying Living Expenses: Not hard at all  Food Insecurity: No Food Insecurity   Worried About Charity fundraiser in the Last Year: Never true   Arboriculturist in  the Last Year: Never true  Transportation Needs: No Transportation Needs   Lack of Transportation (Medical): No   Lack of Transportation (Non-Medical): No  Physical Activity: Insufficiently Active   Days of Exercise per Week: 2 days   Minutes of Exercise per Session: 40 min  Stress: No Stress Concern Present   Feeling of Stress : Not at all  Social Connections: Moderately Integrated   Frequency of Communication with Friends and Family: Twice a week   Frequency of Social Gatherings with Friends and Family: Twice a week   Attends Religious Services: More than 4 times per year   Active Member of [employer] or Organizations: Yes   Attends Archivist Meetings: 1 to 4 times per year   Marital Status: Widowed  Human resources officer Violence: Not At Risk   Fear of Current or Ex-Partner: No   Emotionally Abused: No   Physically Abused: No   Sexually Abused: No    Review of  Systems: ROS is O/W negative except as mentioned in HPI.  Physical Exam: Vital signs in last 24 hours: Temp:  [97.5 F (36.4 C)-97.9 F (36.6 C)] 97.5 F (36.4 C) (05/18 0734) Pulse Rate:  [58-79] 75 (05/18 1300) Resp:  [11-24] 17 (05/18 1300) BP: (113-147)/(49-82) 136/54 (05/18 1300) SpO2:  [93 %-100 %] 98 % (05/18 1300)   General:  Alert, Well-developed, well-nourished, pleasant and cooperative in NAD Head:  Normocephalic and atraumatic. Eyes:  Sclera clear, no icterus.  Conjunctiva pink. Ears:  Normal auditory acuity. Mouth:  No deformity or lesions.   Lungs:  Clear throughout to auscultation.  No wheezes, crackles, or rhonchi.  Heart:  Regular rate and rhythm; no murmurs, clicks, rubs, or gallops. Abdomen:  Soft, non-distended.  BS present.  Non-tender.  Msk:  Symmetrical without gross deformities. Pulses:  Normal pulses noted. Extremities:  Without clubbing or edema. Neurologic:  Alert and oriented x 4;  grossly normal neurologically. Skin:  Intact without significant lesions or rashes. Psych:  Alert and cooperative. Normal mood and affect.  Intake/Output from previous day: 05/17 0701 - 05/18 0700 In: 46.1 [I.V.:46.1] Out: -  Intake/Output this shift: Total I/O In: 345.2 [IV Piggyback:345.2] Out: -   Lab Results: Recent Labs    08/22/21 0212 08/22/21 0844  WBC 6.1 5.1  HGB 12.7 12.5  HCT 36.9 36.0  PLT 165 157   BMET Recent Labs    08/22/21 0212 08/22/21 0844  NA 138 139  K 3.2* 3.6  CL 103 104  CO2 26 26  GLUCOSE 107* 113*  BUN 21 18  CREATININE 1.21* 0.94  CALCIUM 8.8* 8.7*   LFT Recent Labs    08/22/21 0844  PROT 6.3*  ALBUMIN 3.5  AST 22  ALT 17  ALKPHOS 53  BILITOT 0.5   PT/INR Recent Labs    08/22/21 0212  LABPROT 14.5  INR 1.1   Studies/Results: DG Chest Portable 1 View  Result Date: 08/22/2021 CLINICAL DATA:  Chest pain EXAM: PORTABLE CHEST 1 VIEW COMPARISON:  08/21/2021 FINDINGS: Cardiac shadow is stable. Aortic  calcifications are noted. The lungs are well aerated bilaterally. Patchy airspace opacity is noted particularly in the right upper lobe new from the prior exam. No sizable effusion is seen. No bony abnormality is noted. IMPRESSION: Increasing right upper lobe airspace opacity. Electronically Signed   By: Inez Catalina M.D.   On: 08/22/2021 02:36    IMPRESSION:  -71 year old female with 1 day of nausea and vomiting followed by diarrhea that has now lasted  for 4 to 5 days.  Now no bowel movement since last evening.  Apparently the last 2 bowel movements were very black and thick/tarry in appearance.  Hemoglobin is completely normal.  Initially potassium was slightly low, but that has normalized and all of her electrolytes and renal function including BUN is normal.  No Pepto-Bismol, iron, NSAID use.  She is feeling better.  Hopefully she is on the upswing from this.  Suspect some type of gastroenteritis, probably viral.  Stool studies have been ordered. -Chest pain with elevated troponin: Has multivessel coronary artery disease for which she is just being medically managed, takes ASA at home.  Troponins were elevated in ED.  Also diagnosed with pneumonia and started on antibiotics.  PLAN: -Agree with GI pathogen panel if she has recurrent diarrhea. -Otherwise monitor labs and observe clinically, treat symptomatically. -She has already been placed on a diet so we will see how she does with that. -No contraindication to heparin gtt at this time if deemed necessary.   Laban Emperor. Zehr  08/22/2021, 1:49 PM

## 2021-08-22 NOTE — ED Notes (Signed)
Pt is sitting in recliner chair at bedside

## 2021-08-22 NOTE — Progress Notes (Signed)
Echo attempted. Per RN patient to transfer to 6E. Will attempt once patient is on floor.

## 2021-08-22 NOTE — Progress Notes (Signed)
  Echocardiogram Echocardiogram Transesophageal has been performed.  [name] 08/22/2021, 5:16 PM

## 2021-08-22 NOTE — ED Notes (Signed)
Pt destat to upper 80's while sleeping. Pt placed on 2 L [place].

## 2021-08-22 NOTE — Assessment & Plan Note (Signed)
  #)   Essential Hypertension: documented h/o such, with outpatient antihypertensive regimen including HCTZ, metoprolol succinate.  SBP's in the ED today: Normotensive.  In the setting of clinical suspicion for dehydration given recent increase in GI losses, will hold home HCTZ for now.  Plan: Close monitoring of subsequent BP via routine VS. continue beta-blocker while holding home HCTZ.

## 2021-08-22 NOTE — Assessment & Plan Note (Signed)
   #)   GERD: documented h/o such; on omeprazole as outpatient.  However, in the setting of presenting diarrhea, with interest risk for such as a side effect of omeprazole, will hold home PPI for now.  Plan: Hold home PPI.

## 2021-08-22 NOTE — Assessment & Plan Note (Signed)
 #)   Hypokalemia: Serum potassium level noted to be 3.2.  There appears to be an element of chronicity to this, as the patient is on potassium chloride 20 mill colons p.o. daily as an outpatient.  However, suspect acute exacerbation thereof in the context of recent increase GI losses, as above.  Particular in the setting of suspected presenting NSTEMI, will strive for optimization of the patient's potassium level, as further detailed below.  However, given concomitant acute kidney injury, with associated increased risk for ensuing hyperkalemia, will proceed with caution with supplementation efforts.   Plan: Continuous lactated Ringer's, as above.  Resume home potassium chloride, as above.  Magnesium sulfate 2 g IV over 2 hours x 1 dose now.  Repeat CMP in the morning.  Monitor on telemetry.  Further evaluation of presenting nausea, vomiting, diarrhea, as above.

## 2021-08-22 NOTE — H&P (Signed)
History and Physical    PLEASE NOTE THAT DRAGON DICTATION SOFTWARE WAS USED IN THE CONSTRUCTION OF THIS NOTE.   [name] WIO:[id number] DOB: 1951/03/06 DOA: 08/22/2021  PCP: Martinique, Betty G, MD  Patient coming from: home   I have personally briefly reviewed patient's old medical records in [place]  Chief Complaint: Nausea, vomiting, diarrhea  HPI: [name] is a 71 y.o. female with medical history significant for coronary artery disease, GAD, type 2 diabetes mellitus, hypertension, hyperlipidemia, chronic systolic heart failure, who is admitted to [facility] on 08/22/2021 with NSTEMI after presenting from [facility] ED to [facility] emergency department, after initially presenting to the former facility complaining of nausea, vomiting, diarrhea.  The patient reports 4 days of nausea, vomiting, diarrhea.  She notes that the nausea is been intermittent in nature, but is resulted in at least 2-3 daily episodes of nonbloody, nonbilious emesis, with most recent such episode occurring just prior to presenting to [facility] emergency department on 08/21/2021.  She notes that this is also been associated with at least 3-4 daily episodes of watery, nonbloody loose stool over that timeframe, with most recent episode occurring at [facility] emergency department.  Denies any associated abdominal pain or preceding trauma.    Denies associated subjective fever, chills, rigors, or generalized myalgias, but does report associated shortness of breath associated with new onset nonproductive cough.  Denies any associated chest pain, palpitations, diaphoresis.  In the context of her recurrent nausea/vomiting, she reports very little oral intake over the last few days.  In this context, she reports sensation of dizziness and moving from a seated to standing position, without vertigo, and without associated syncope or fall.  Denies any recent dysuria, gross hematuria, or  change in urinary urgency/frequency.  Her shortness of breath is not been associate with any orthopnea, PND, new onset lower extremity edema, nor any recent calf tenderness, or new lower extremity erythema.  Denies any hemoptysis.  No recent travel.  Per chart review, the patient has a history of coronary artery disease, and most recently underwent left-sided coronary angiography on 03/08/2021 revealing multivessel CAD that is reportedly not amenable to PCI.  She has been undergoing medical management in the interval, with outpatient medications notable for daily baby aspirin as well as metoprolol succinate.  She follows with [name] of [facility] cardiology.  Most recent echocardiogram occurred on 03/08/2021 was notable for LVEF 45 to 50%, mild LVH, indeterminate diastolic parameters, normal right ventricular systolic function, and no evidence of significant valvular pathology.  Outpatient medications notable for HCTZ, in the absence of any scheduled loop diuretics.  In the setting of her nausea, vomiting, diarrhea over the last 4 days, the patient presented to [facility] ED on 08/21/2021, with evaluation at that time reportedly notable for troponin of 786, serum creatinine 1.3 relative to reported baseline of 0.9.  While at [facility] ED, she received a 500 cc normal saline bolus as well as full dose aspirin. she subsequently underwent ED to ED transfer, transferring to [facility] emergency department for further evaluation and management of suspected NSTEMI.      [name] ED Course:  Vital signs in the ED were notable for the following: Afebrile; heart rate 70-73; blood pressure 118/70; respiratory rate 16-18, oxygen saturation 96% on room air.  Labs were notable for the following: CMP notable for the following: Potassium 3.2, bicarbonate 26, creatinine 1.21, glucose 107, liver enzymes within normal limits.  Serum  magnesium level 1.9.  High-sensitivity troponin I found to be 86.  BNP 172.  CBC  notable for white blood cell count 6100, hemoglobin 12.7.  INR 1.1.  COVID-19/influenza PCR negative.  Imaging and additional notable ED work-up: EKG showed sinus rhythm with ventricular bigeminy, ventricular rate 85, normal intervals, no evidence of T wave or ST changes.  Chest x-ray showed interval development of right upper lobe airspace opacity, without evidence of edema, effusion, or pneumothorax.  EDP discussed patient's case with on-call [facility] cardiology, who felt that, with troponin trending down, that presentation was most suggestive of a type II supply/demand mismatch process, as opposed to representing a type I process due to acute plaque rupture.  In this setting, cardiology recommended discontinuation of heparin drip, and will formally consult, with additional recommendations pending at this time.  While in the ED, the following were administered: Heparin bolus followed by initiation of heparin drip  Subsequently, the patient was admitted for further evaluation management of suspected type II NSTEMI after presenting with 4 days of nausea, vomiting, diarrhea, with laboratory abnormalities notable for hypokalemia, acute kidney injury, along with suspected community-acquired pneumonia in the setting of interval development of right upper lobe airspace opacity.     Review of Systems: As per HPI otherwise 10 point review of systems negative.   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[place])    Anxiety    Arthritis    Diabetes mellitus without complication (HCC)    GERD (gastroesophageal reflux disease)    Heart murmur    Hyperlipidemia    Hypertension    Neuromuscular disorder (HCC)    Palpitations    Pneumonia    PONV (postoperative nausea and vomiting)    PTSD (post-traumatic stress disorder)    Ringing of ears, left     Past Surgical History:  Procedure Laterality Date   ABDOMINAL HYSTERECTOMY     partial   ACHILLES TENDON SURGERY Left     APPENDECTOMY     BACK SURGERY     BLADDER SUSPENSION     BREAST SURGERY Right 2003   milk duct removed   CHOLECYSTECTOMY     FRACTURE SURGERY     collar bone   LEFT HEART CATH AND CORONARY ANGIOGRAPHY N/A 03/08/2021   Procedure: LEFT HEART CATH AND CORONARY ANGIOGRAPHY;  Surgeon: Martinique, Peter M, MD;  Location: [place] [facility];  Service: Cardiovascular;  Laterality: N/A;   NOSE SURGERY     x 2    OTHER SURGICAL HISTORY     Breast Duct removed   PARTIAL HYSTERECTOMY     RECTOCELE REPAIR     TONSILLECTOMY     VASCULAR SURGERY Right    right leg   VEIN SURGERY Right    leg    Social History:  reports that she has never smoked. She has never used smokeless tobacco. She reports that she does not drink alcohol and does not use drugs.   Allergies  Allergen Reactions   Hydromorphone Nausea And Vomiting and Other (See Comments)    Made the patient feel "spaced out,"     Morphine And Related Itching    With high doses   Other Itching   Penicillins Rash    Has patient had a PCN reaction causing immediate rash, facial/tongue/throat swelling, SOB or lightheadedness with hypotension: Yes Has patient had a PCN reaction causing severe rash involving mucus membranes or skin necrosis: No Has patient had a PCN reaction that required  hospitalization: No Has patient had a PCN reaction occurring within the last 10 years: No If all of the above answers are "NO", then may proceed with Cephalosporin use.     Family History  Problem Relation Age of Onset   Diabetes Mother    Alzheimer's disease Mother    Lung cancer Father    Esophageal cancer Brother    Colon cancer Neg Hx      Prior to Admission medications   Medication Sig Start Date End Date Taking? Authorizing Provider  ACCU-CHEK SOFTCLIX LANCETS lancets Use to test blood sugar 3 times daily. Dx: E11.8 08/11/17   Martinique, Betty G, MD  albuterol (VENTOLIN HFA) 108 (90 Base) MCG/ACT inhaler INHALE 2 PUFFS INTO THE LUNGS EVERY 6 HOURS  AS NEEDED FOR WHEEZING OR SHORTNESS OF BREATH 01/02/21   Martinique, Betty G, MD  Alcohol Swabs (B-D SINGLE USE SWABS REGULAR) PADS Use to test blood sugar 3 times daily 08/11/17   Martinique, Betty G, MD  aspirin EC 81 MG tablet Take 81 mg by mouth daily. Swallow whole.    [provider]  Blood Glucose Calibration (ACCU-CHEK AVIVA) SOLN Use as directed 08/11/17   Martinique, Betty G, MD  Blood Glucose Monitoring Suppl (ACCU-CHEK AVIVA PLUS) w/Device KIT Use to check blood sugar 3 times daily 07/22/18   Martinique, Betty G, MD  clonazePAM (KLONOPIN) 1 MG tablet TAKE 1 TABLET(1 MG) BY MOUTH TWICE DAILY AS NEEDED FOR ANXIETY 07/11/21   Martinique, Betty G, MD  Dulaglutide (TRULICITY) 1.5 HF/0.2OV SOPN Inject 1.5 mg into the skin once a week. 04/09/21   Martinique, Betty G, MD  ezetimibe (ZETIA) 10 MG tablet Take 1 tablet (10 mg total) by mouth daily. 01/30/21   Martinique, Betty G, MD  fluticasone [facility]) 50 MCG/ACT nasal spray Place 2 sprays into both nostrils daily as needed for allergies or rhinitis. 07/31/21   Martinique, Betty G, MD  gabapentin (NEURONTIN) 600 MG tablet Take 1 tablet (600 mg total) by mouth in the morning, at noon, in the evening, and at bedtime. 06/10/21   Bayard Hugger, NP  glucose blood (ACCU-CHEK AVIVA) test strip Use to test blood sugar 3 times daily. Dx: 11.8 08/11/17   Martinique, Betty G, MD  hydrochlorothiazide (HYDRODIURIL) 25 MG tablet TAKE 1 TABLET BY MOUTH EVERY DAY 04/17/21   Martinique, Betty G, MD  HYDROcodone-acetaminophen (NORCO) 7.5-325 MG tablet Take 1 tablet by mouth every 6 (six) hours as needed for moderate pain. 08/09/21   Bayard Hugger, NP  ipratropium (ATROVENT) 0.06 % nasal spray Place 2 sprays into both nostrils 4 (four) times daily. 11/02/20   Martinique, Betty G, MD  Magnesium 250 MG TABS Take 250 mg by mouth daily.    [provider]  metoprolol succinate (TOPROL XL) 25 MG 24 hr tablet Take 0.5 tablets (12.5 mg total) by mouth at bedtime. 04/10/21   Strader, Fransisco Hertz, PA-C  omeprazole  (PRILOSEC) 40 MG capsule Take 1 capsule (40 mg total) by mouth daily. 02/08/21   Martinique, Betty G, MD  ondansetron (ZOFRAN-ODT) 4 MG disintegrating tablet Take 4 mg by mouth daily as needed for nausea/vomiting. 03/04/21   [provider]  potassium chloride SA (KLOR-CON M) 20 MEQ tablet TAKE 1 TABLET BY MOUTH EVERY DAY 08/13/21   Martinique, Betty G, MD  QUEtiapine (SEROQUEL) 100 MG tablet Take 0.5 tablets (50 mg total) by mouth at bedtime. 05/29/21   Martinique, Betty G, MD  triamcinolone (KENALOG) 0.1 % paste Use as directed  1 application. in the mouth or throat 2 (two) times daily. 07/02/21   Martinique, Betty G, MD     Objective    Physical Exam: Vitals:   08/22/21 0143 08/22/21 0300  BP: (!) 113/52 118/69  Pulse: 72 60  Resp: 18 16  Temp: 97.9 F (36.6 C)   TempSrc: Oral   SpO2: 93% 96%    General: appears to be stated age; alert, oriented Skin: warm, dry, no rash Head:  AT/[place] Mouth:  Oral mucosa membranes appear dry, normal dentition Neck: supple; trachea midline Heart:  RRR; did not appreciate any M/R/G Lungs: CTAB, did not appreciate any wheezes, rales, or rhonchi Abdomen: + BS; soft, ND, NT Vascular: 2+ pedal pulses b/l; 2+ radial pulses b/l Extremities: no peripheral edema, no muscle wasting Neuro: strength and sensation intact in upper and lower extremities b/l   Labs on Admission: I have personally reviewed following labs and imaging studies  CBC: Recent Labs  Lab 08/22/21 0212  WBC 6.1  HGB 12.7  HCT 36.9  MCV 92.5  PLT 037   Basic Metabolic Panel: Recent Labs  Lab 08/22/21 0212  NA 138  K 3.2*  CL 103  CO2 26  GLUCOSE 107*  BUN 21  CREATININE 1.21*  CALCIUM 8.8*  MG 1.9   GFR: Estimated Creatinine Clearance: 49.5 mL/min (A) (by C-G formula based on SCr of 1.21 mg/dL (H)). Liver Function Tests: Recent Labs  Lab 08/22/21 0212  AST 20  ALT 17  ALKPHOS 53  BILITOT 1.0  PROT 6.6  ALBUMIN 3.7   No results for input(s): LIPASE, AMYLASE in the  last 168 hours. No results for input(s): AMMONIA in the last 168 hours. Coagulation Profile: Recent Labs  Lab 08/22/21 0212  INR 1.1   Cardiac Enzymes: No results for input(s): CKTOTAL, CKMB, CKMBINDEX, TROPONINI in the last 168 hours. BNP (last 3 results) No results for input(s): PROBNP in the last 8760 hours. HbA1C: No results for input(s): HGBA1C in the last 72 hours. CBG: Recent Labs  Lab 08/22/21 0318  GLUCAP 108*   Lipid Profile: No results for input(s): CHOL, HDL, LDLCALC, TRIG, CHOLHDL, LDLDIRECT in the last 72 hours. Thyroid Function Tests: No results for input(s): TSH, T4TOTAL, FREET4, T3FREE, THYROIDAB in the last 72 hours. Anemia Panel: No results for input(s): VITAMINB12, FOLATE, FERRITIN, TIBC, IRON, RETICCTPCT in the last 72 hours. Urine analysis:    Component Value Date/Time   COLORURINE YELLOW 12/22/2016 2032   APPEARANCEUR CLEAR 12/22/2016 2032   LABSPEC 1.026 12/22/2016 2032   PHURINE 6.0 12/22/2016 2032   GLUCOSEU >=500 (A) 12/22/2016 2032   HGBUR NEGATIVE 12/22/2016 2032   BILIRUBINUR positive 12/18/2020 [place] 12/22/2016 2032   PROTEINUR Positive (A) 12/18/2020 1611   PROTEINUR NEGATIVE 12/22/2016 2032   UROBILINOGEN 0.2 12/18/2020 1611   UROBILINOGEN 0.2 03/09/2014 2118   NITRITE negative 12/18/2020 1611   NITRITE NEGATIVE 12/22/2016 2032   LEUKOCYTESUR Negative 12/18/2020 1611    Radiological Exams on Admission: DG Chest Portable 1 View  Result Date: 08/22/2021 CLINICAL DATA:  Chest pain EXAM: PORTABLE CHEST 1 VIEW COMPARISON:  08/21/2021 FINDINGS: Cardiac shadow is stable. Aortic calcifications are noted. The lungs are well aerated bilaterally. Patchy airspace opacity is noted particularly in the right upper lobe new from the prior exam. No sizable effusion is seen. No bony abnormality is noted. IMPRESSION: Increasing right upper lobe airspace opacity. Electronically Signed   By: Inez Catalina M.D.   On: 08/22/2021 02:36  EKG: Independently reviewed, with result as described above.    Assessment/Plan   Principal Problem:   NSTEMI (non-ST elevated myocardial infarction) ([place]) Active Problems:   Type 2 diabetes mellitus with neurological complications (HCC)   HYPERCHOLESTEROLEMIA   HYPOKALEMIA   GERD   Essential hypertension   Anxiety   Nausea vomiting and diarrhea   AKI (acute kidney injury) ([place])   CAP (community acquired pneumonia)   Chronic systolic CHF (congestive heart failure) ([place])      #) Type 2 NSTEMI: initial troponin at [facility] ED reported to be 786, with repeat troponin upon arrival at [facility] in ED noted to be 86, in the absence of any recent chest pain, with EKG showing ventricular bigeminy, otherwise no evidence of acute ischemic changes, while presenting chest x-ray suggestive of right upper lobe airspace opacity, but otherwise no acute cardiopulmonary process, including no evidence of pneumothorax.   patient's case was d/w on-call [facility] cardiology, who felt that, with troponin trending down, that presentation was most suggestive of a type II supply/demand mismatch process, as opposed to representing a type I process due to acute plaque rupture.  In this setting, cardiology recommended discontinuation of heparin drip, and will formally consult, with additional recommendations pending at this time.  This is in the context of a known history of CAD status post left-sided coronary angiography in December 2022 showing evidence of multivessel coronary disease that was reportedly not amenable to PCI, with interval medical management, as above.   At risk for type process in setting of suspected present community-acquired pneumonia along with dehydration as a consequence of 4 days of nausea, vomiting, diarrhea, along with relative decline in renal clearance of troponin in the setting of AKI.  Overall, ACS is felt to be less likely relative to type II supply/demand mismatch.  Presentation clinically less suggestive of acute PE .   Of note, full dose aspirin was administered and antibiotic management prior to transfer.     Plan: Monitor on telemetry. PRN EKG for development of chest pain.  Magnesium sulfate 2 g IV every 2 hours x1 dose now.  Repeat serum magnesium level in the morning.  Further evaluation management presenting hypokalemia, as further detailed below. repeat BMP in the morning.  Repeat CBC in the AM.  Cardiology consulted, as above.  Further recommendations, heparin drip has been discontinued.  Continue home beta-blocker.  Echocardiogram in the morning.  Further evaluation and management of presenting acute kidney injury.         #), Nausea, vomiting, diarrhea: 4 days of these recurrent symptoms, as further quantified above, in the absence of any evidence of GI bleed. Etiology not entirely clear at this time.  Given that the patient has been experiencing at least 3-4 episodes of diarrhea per day over the last 4 days, will also check for C. difficile.  Considered the possibility of viral gastroenteritis, although the absence of any associated abdominal discomfort as well as the duration of the symptoms appear to render this possibility to be less likely.  Differential also includes potential gastroparesis in the setting of a documented history of such versus gastrointestinal sequela of suspected presenting community-acquired pneumonia.  Additionally, the patient is on some medications that can be associated with promotility outcomes, including that of omeprazole.  Will these medications to decrease any confounding contributions that they may pose.   Plan: Check C. difficile, as above.  Gentle IV fluids in the form of lactated Ringer's at 50 cc/h x 8 hours.  Monitor strict I's and O's and daily weights.  Prn Zofran.  Refraining from antimotility agents for now until infectious diarrhea has been ruled out.  Magnesium supplementation, as above.  Further  evaluation management of presenting hypokalemia, as further detailed below.  Repeat CMP and CBC in the morning.  Hold omeprazole.  Check lipase.        #) Hypokalemia: Serum potassium level noted to be 3.2.  There appears to be an element of chronicity to this, as the patient is on potassium chloride 20 mill colons p.o. daily as an outpatient.  However, suspect acute exacerbation thereof in the context of recent increase GI losses, as above.  Particular in the setting of suspected presenting NSTEMI, will strive for optimization of the patient's potassium level, as further detailed below.  However, given concomitant acute kidney injury, with associated increased risk for ensuing hyperkalemia, will proceed with caution with supplementation efforts.   Plan: Continuous lactated Ringer's, as above.  Resume home potassium chloride, as above.  Magnesium sulfate 2 g IV over 2 hours x 1 dose now.  Repeat CMP in the morning.  Monitor on telemetry.  Further evaluation of presenting nausea, vomiting, diarrhea, as above.       #) Acute kidney injury: Labs at [facility] on 08/21/2021 reportedly notable for serum creatinine of 1.3, which is reportedly relative to her baseline creatinine of 0.9.  Suspect this is prerenal in nature, the context of intravascular depletion as a consequence of increased GI losses as well as consequential decline in oral intake.  Following interval 500 cc normal saline bolus, creatinine appears to be improving, with presenting value noted to be 1.2 upon arrival at [facility] emergency department.  We will continue to provide gentle IV fluids, as further detailed below, while closely monitoring for ensuing development of acute volume overload given the patient's history of chronic systolic heart failure.  Plan: Lactated Ringer's at 50 cc/h x 8 hours.  Monitor strict I's and O's and daily weights.  Check urinalysis with microscopy.  Add on random urine sodium as well as random urine  creatinine.  Tempt avoid nephrotoxic agents.  Repeat CMP in the morning.        #) Community-acquired pneumonia: Diagnosis suspected on basis of 4 days of new onset shortness of breath associated with cough, with chest x-ray showing interval development of right upper lobe airspace opacity.  Of note, no SIRS criteria met at this time.  Therefore criteria for sepsis not currently met.  While patient has some documentation of the history of allergic reaction to penicillin, inpatient pharmacist as conveyed to me that she has safely received cephalosporins on multiple occasions.  Consequently, we will initiate azithromycin/Rocephin as empiric coverage for for community-acquired pneumonia.  will also further assess by checking procalcitonin level.  Given concomitant GI symptoms, will also check for Legionella, as below.   Plan: Blood cultures x2 followed by initiation of Rocephin and azithromycin.  Add on procalcitonin level.  Flutter valve.  Incentive spirometry.  Check serum phosphorus level.  Repeat CMP in the morning.  Check legionella and strep pneumoniae urine antigens.         #) Generalized anxiety disorder: Documented history of such, on as needed clonazepam as an outpatient, in the absence of any scheduled SSRI or SNRI.  Plan: Continue outpatient as needed clonazepam.          #) Type 2 Diabetes Mellitus: documented history of such. Home insulin regimen: (none). On Trulicity as outpatient. presenting  blood sugar:  107.  Appears to be complicated by diabetic peripheral polyneuropathy, with the patient also on scheduled gabapentin at home.  Per chart review, there is also documentation of diabetic gastroparesis.   Plan: accuchecks QAC and HS with low dose SSI.  Hold home Trulicity as well as gabapentin in the setting of AKI.         #) GERD: documented h/o such; on omeprazole as outpatient.  However, in the setting of presenting diarrhea, with interest risk for such as a  side effect of omeprazole, will hold home PPI for now.  Plan: Hold home PPI.           #) Essential Hypertension: documented h/o such, with outpatient antihypertensive regimen including HCTZ, metoprolol succinate.  SBP's in the ED today: Normotensive.  In the setting of clinical suspicion for dehydration given recent increase in GI losses, will hold home HCTZ for now.  Plan: Close monitoring of subsequent BP via routine VS. continue beta-blocker while holding home HCTZ.           #) Hyperlipidemia: documented h/o such. On Zetia as outpatient.    Plan: continue home Zetia .           #) Chronic systolic heart failure: documented history of such, with most recent echocardiogram performed in December 2022, with results notable for LVEF 45 to 50% as well as indeterminate diastolic parameters in the absence of any associated valvular pathology. No clinical or radiographic evidence to suggest acutely decompensated heart failure at this time. home diuretic regimen reportedly consists of the following: HCTZ in the absence of any scheduled loop diuretics at home. Home cardiac medications also include the following: Metoprolol succinate in the absence of any ACE inhibitor or ARB.  In the context of suspicion for presenting dehydration, we will proceed with gentle IV fluids, closely monitoring for evidence of ensuing development of acute volume overload, as further detailed below.   Plan: monitor strict I's & O's and daily weights. Repeat BMP in AM. Check serum mag level.  Hold home HCTZ for now.  Continue home beta-blocker.  Repeat echocardiogram has been ordered for the morning to further evaluate presenting NSTEMI.        DVT prophylaxis: SCD's   Code Status: Full code Family Communication: none Disposition Plan: Per Rounding Team Consults called: case d/w on-call [facility] cardiology, who will formally consult, as further detailed above;  Admission status:  observation   PLEASE NOTE THAT DRAGON DICTATION SOFTWARE WAS USED IN THE CONSTRUCTION OF THIS NOTE.   [place] DO [facility]  From [place]   08/22/2021, 4:44 AM

## 2021-08-22 NOTE — ED Triage Notes (Signed)
Pt from [facility] with [facility], transferred here to see cardiologist then admission. Went to [facility] for abdominal pain with NVD x3 days, found to have troponin of 779. Pt denies chest pain. Received 500NS, compazine, '325mg'$  ASA, 0.'5mg'$  IV ativan. Pt had cardiac cath in December 2022, six blockages that were not stented at that time

## 2021-08-22 NOTE — Progress Notes (Addendum)
Progress Note  Patient Name: [name] Date of Encounter: 08/22/2021  [facility] Cardiologist: Dorris Carnes, MD   Subjective   Patient denies any chest pain, sob. Does notice some fluttering in her chest   Inpatient Medications    Scheduled Meds:  aspirin EC  81 mg Oral Daily   ezetimibe  10 mg Oral Daily   insulin aspart  0-6 Units Subcutaneous TID WC   metoprolol succinate  12.5 mg Oral Daily   potassium chloride SA  20 mEq Oral Daily   Continuous Infusions:  azithromycin 500 mg (08/22/21 0656)   cefTRIAXone (ROCEPHIN)  IV Stopped (08/22/21 0735)   lactated ringers 50 mL/hr at 08/22/21 0523   PRN Meds: acetaminophen **OR** acetaminophen, clonazePAM, ondansetron (ZOFRAN) IV   Vital Signs    Vitals:   08/22/21 0300 08/22/21 0645 08/22/21 0715 08/22/21 0734  BP: 118/69 131/63 (!) 147/82   Pulse: 60 68 (!) 58   Resp: '16 12 15   '$ Temp:  97.6 F (36.4 C)  (!) 97.5 F (36.4 C)  TempSrc:  Oral  Oral  SpO2: 96% 100% 100%     Intake/Output Summary (Last 24 hours) at 08/22/2021 0742 Last data filed at 08/22/2021 0735 Gross per 24 hour  Intake 140.66 ml  Output --  Net 140.66 ml      08/09/2021    3:11 PM 07/15/2021    3:56 PM 06/10/2021    2:50 PM  Last 3 Weights  Weight (lbs) 183 lb 181 lb 3.2 oz 183 lb  Weight (kg) 83.008 kg 82.192 kg 83.008 kg      Telemetry    Sinus rhythm, ventricular bigeminy/PVCs - Personally Reviewed  ECG    Sinus rhythm with ventricular bigeminy - Personally Reviewed  Physical Exam   GEN: No acute distress. Laying flat in the bed  Neck: No JVD Cardiac: RRR, grade 2/6 systolic murmur at RUSB. Radial pulses 2+ bilaterally   Respiratory: Clear to auscultation bilaterally. GI: Soft, nontender, non-distended  MS: No edema; No deformity. Neuro:  Nonfocal  Psych: Normal affect   Labs    High Sensitivity Troponin:   Recent Labs  Lab 08/22/21 0212 08/22/21 0426  TROPONINIHS 86* 80*     Chemistry Recent Labs  Lab  08/22/21 0212  NA 138  K 3.2*  CL 103  CO2 26  GLUCOSE 107*  BUN 21  CREATININE 1.21*  CALCIUM 8.8*  MG 1.9  PROT 6.6  ALBUMIN 3.7  AST 20  ALT 17  ALKPHOS 53  BILITOT 1.0  GFRNONAA 49*  ANIONGAP 9    Lipids No results for input(s): CHOL, TRIG, HDL, LABVLDL, LDLCALC, CHOLHDL in the last 168 hours.  Hematology Recent Labs  Lab 08/22/21 0212  WBC 6.1  RBC 3.99  HGB 12.7  HCT 36.9  MCV 92.5  MCH 31.8  MCHC 34.4  RDW 12.1  PLT 165   Thyroid No results for input(s): TSH, FREET4 in the last 168 hours.  BNP Recent Labs  Lab 08/22/21 0212  BNP 172.3*    DDimer No results for input(s): DDIMER in the last 168 hours.   Radiology    DG Chest Portable 1 View  Result Date: 08/22/2021 CLINICAL DATA:  Chest pain EXAM: PORTABLE CHEST 1 VIEW COMPARISON:  08/21/2021 FINDINGS: Cardiac shadow is stable. Aortic calcifications are noted. The lungs are well aerated bilaterally. Patchy airspace opacity is noted particularly in the right upper lobe new from the prior exam. No sizable effusion is seen. No  bony abnormality is noted. IMPRESSION: Increasing right upper lobe airspace opacity. Electronically Signed   By: Inez Catalina M.D.   On: 08/22/2021 02:36    Cardiac Studies   Left Heart Cath 03/08/2021    Ost LAD to Prox LAD lesion is 40% stenosed.   Mid LAD lesion is 99% stenosed.   1st Diag lesion is 90% stenosed.   1st Mrg lesion is 80% stenosed.   2nd Mrg lesion is 90% stenosed.   Mid Cx lesion is 80% stenosed.   LV end diastolic pressure is normal.   There is no aortic valve stenosis.   Severe obstructive CAD with left dominant circulation Subtotal mid LAD stenosis Complex LCx disease with severe stenoses involving bifurcation of 4 branches of the LCx. Normal LVEDP   Plan: needs referral to CT surgery for CABG. The LCx disease is not amenable to PCI.   Echocardiogram 03/08/2021   1. Left ventricular ejection fraction, by estimation, is 45 to 50%. The  left ventricle  has mildly decreased function. The left ventricle  demonstrates regional wall motion abnormalities (see scoring  diagram/findings for description). There is mild left  ventricular hypertrophy. Left ventricular diastolic parameters are  indeterminate.   2. Right ventricular systolic function is normal. The right ventricular  size is normal. There is normal pulmonary artery systolic pressure. The  estimated right ventricular systolic pressure is 28.4 mmHg.   3. Left atrial size was mildly dilated.   4. The mitral valve is normal in structure. Trivial mitral valve  regurgitation.   5. The aortic valve is tricuspid. Aortic valve regurgitation is not  visualized. Aortic valve sclerosis/calcification is present, without any  evidence of aortic stenosis.   6. The inferior vena cava is dilated in size with >50% respiratory  variability, suggesting right atrial pressure of 8 mmHg.   Patient Profile     71 y.o. female with a hx of CAD s/p LHC (Dec 2022 - subtotal mid LAD, complex LCX-not amenable for PCI, was medically mx and followed CTSx for possible CABG), HTN, HLD, Palpitations/PVCs, Dm-2, PTSD who was seen 08/22/2021 for the evaluation of elevated troponin at the request of ER doc.  Assessment & Plan    Elevated Troponin  - hsTn 86>>80 - Minimal elevation with a flat trend, patient denies chest pain-- not consistent with ACS. Likely demand ischemia in the setting of dehydration (vomiting/diarrhea), AKI, acute infection  CAD  HLD - Most recent cardiac cath from 03/08/2021 above  - Patient denies any chest pain - Continue ASA, metoprolol - Has documented intolerance of multiple statins, continue Zetia. Will likely need lipid clinic referral at discharge  PVCs  - Patient has PVCs/ventricular bigeminy on EKG and telemetry. Reports feeling a fluttering in her chest   - Continue metoprolol  - Maintain K>4, mag>2. Patient was hypokalemic on presentation (3.2) in the setting of GI loss.  K is  being supplemented per primary    Ischemic Cardiomyopathy  Chronic HFrEF  - Most recent echo in 03/2021 showed EF 45-50%, echo this admission pending  - Patient receiving IV fluids due to her vomiting/diarrhea and dehydration, AKI monitor patient closely for signs of volume overload. Patient euvolemic on exam  - Continue metoprolol  - Consider starting ARB/ARNI this admission if EF still reduced and as AKI improves. Patient in the past has been very hesitant about starting new medications in the past, but ideally she would be on more GDMT than just metoprolol     Otherwise per primary  -  Nausea, vomiting, diarrhea  - CAP  - Generalized Anxiety Disorder  - Type 2 DM  - GERD  For questions or updates, please contact [facility] Please consult [email or web address] for contact info under        Signed, Margie Billet, PA-C  08/22/2021, 7:42 AM     The patient was seen earlier this morning by overnight call physician.  Chart reviewed including her conversations with CVTS.  She actually has follow up with Dr. Prescott Gum in June to further discuss surgical vs medical management.   She presents with non cardiac complaints and has a mild troponin elevation.  We will follow during this admission and see again in the AM.

## 2021-08-22 NOTE — Assessment & Plan Note (Signed)
 #)   Type 2 NSTEMI: initial troponin at [facility] ED reported to be 786, with repeat troponin upon arrival at [facility] in ED noted to be 86, in the absence of any recent chest pain, with EKG showing ventricular bigeminy, otherwise no evidence of acute ischemic changes, while presenting chest x-ray suggestive of right upper lobe airspace opacity, but otherwise no acute cardiopulmonary process, including no evidence of pneumothorax.   patient's case was d/w on-call [facility] cardiology, who felt that, with troponin trending down, that presentation was most suggestive of a type II supply/demand mismatch process, as opposed to representing a type I process due to acute plaque rupture.  In this setting, cardiology recommended discontinuation of heparin drip, and will formally consult, with additional recommendations pending at this time.  This is in the context of a known history of CAD status post left-sided coronary angiography in December 2022 showing evidence of multivessel coronary disease that was reportedly not amenable to PCI, with interval medical management, as above.   At risk for type process in setting of suspected present community-acquired pneumonia along with dehydration as a consequence of 4 days of nausea, vomiting, diarrhea, along with relative decline in renal clearance of troponin in the setting of AKI.  Overall, ACS is felt to be less likely relative to type II supply/demand mismatch. Presentation clinically less suggestive of acute PE .   Of note, full dose aspirin was administered and antibiotic management prior to transfer.     Plan: Monitor on telemetry. PRN EKG for development of chest pain.  Magnesium sulfate 2 g IV every 2 hours x1 dose now.  Repeat serum magnesium level in the morning.  Further evaluation management presenting hypokalemia, as further detailed below. repeat BMP in the morning.  Repeat CBC in the AM.  Cardiology consulted, as above.  Further recommendations,  heparin drip has been discontinued.  Continue home beta-blocker.  Echocardiogram in the morning.  Further evaluation and management of presenting acute kidney injury.

## 2021-08-22 NOTE — Assessment & Plan Note (Signed)
 #)   Acute kidney injury: Labs at [facility] on 08/21/2021 reportedly notable for serum creatinine of 1.3, which is reportedly relative to her baseline creatinine of 0.9.  Suspect this is prerenal in nature, the context of intravascular depletion as a consequence of increased GI losses as well as consequential decline in oral intake.  Following interval 500 cc normal saline bolus, creatinine appears to be improving, with presenting value noted to be 1.2 upon arrival at [facility] emergency department.  We will continue to provide gentle IV fluids, as further detailed below, while closely monitoring for ensuing development of acute volume overload given the patient's history of chronic systolic heart failure.  Plan: Lactated Ringer's at 50 cc/h x 8 hours.  Monitor strict I's and O's and daily weights.  Check urinalysis with microscopy.  Add on random urine sodium as well as random urine creatinine.  Tempt avoid nephrotoxic agents.  Repeat CMP in the morning.

## 2021-08-23 ENCOUNTER — Other Ambulatory Visit (HOSPITAL_COMMUNITY): Payer: Self-pay

## 2021-08-23 ENCOUNTER — Encounter (HOSPITAL_COMMUNITY): Payer: Self-pay | Admitting: Internal Medicine

## 2021-08-23 ENCOUNTER — Telehealth: Payer: Self-pay | Admitting: *Deleted

## 2021-08-23 ENCOUNTER — Inpatient Hospital Stay (HOSPITAL_COMMUNITY): Payer: Medicare Other

## 2021-08-23 DIAGNOSIS — J189 Pneumonia, unspecified organism: Secondary | ICD-10-CM | POA: Diagnosis not present

## 2021-08-23 DIAGNOSIS — I248 Other forms of acute ischemic heart disease: Secondary | ICD-10-CM | POA: Diagnosis not present

## 2021-08-23 DIAGNOSIS — Z20822 Contact with and (suspected) exposure to covid-19: Secondary | ICD-10-CM | POA: Diagnosis not present

## 2021-08-23 DIAGNOSIS — N179 Acute kidney failure, unspecified: Secondary | ICD-10-CM | POA: Diagnosis not present

## 2021-08-23 DIAGNOSIS — Z7982 Long term (current) use of aspirin: Secondary | ICD-10-CM | POA: Diagnosis not present

## 2021-08-23 DIAGNOSIS — E7800 Pure hypercholesterolemia, unspecified: Secondary | ICD-10-CM | POA: Diagnosis not present

## 2021-08-23 DIAGNOSIS — Z833 Family history of diabetes mellitus: Secondary | ICD-10-CM | POA: Diagnosis not present

## 2021-08-23 DIAGNOSIS — I255 Ischemic cardiomyopathy: Secondary | ICD-10-CM | POA: Diagnosis not present

## 2021-08-23 DIAGNOSIS — F419 Anxiety disorder, unspecified: Secondary | ICD-10-CM | POA: Diagnosis not present

## 2021-08-23 DIAGNOSIS — I214 Non-ST elevation (NSTEMI) myocardial infarction: Secondary | ICD-10-CM | POA: Diagnosis not present

## 2021-08-23 DIAGNOSIS — I493 Ventricular premature depolarization: Secondary | ICD-10-CM | POA: Diagnosis not present

## 2021-08-23 DIAGNOSIS — A084 Viral intestinal infection, unspecified: Secondary | ICD-10-CM | POA: Diagnosis not present

## 2021-08-23 DIAGNOSIS — I5022 Chronic systolic (congestive) heart failure: Secondary | ICD-10-CM | POA: Diagnosis not present

## 2021-08-23 DIAGNOSIS — Z79899 Other long term (current) drug therapy: Secondary | ICD-10-CM | POA: Diagnosis not present

## 2021-08-23 DIAGNOSIS — Z888 Allergy status to other drugs, medicaments and biological substances status: Secondary | ICD-10-CM | POA: Diagnosis not present

## 2021-08-23 DIAGNOSIS — Z885 Allergy status to narcotic agent status: Secondary | ICD-10-CM | POA: Diagnosis not present

## 2021-08-23 DIAGNOSIS — Z7985 Long-term (current) use of injectable non-insulin antidiabetic drugs: Secondary | ICD-10-CM | POA: Diagnosis not present

## 2021-08-23 DIAGNOSIS — K219 Gastro-esophageal reflux disease without esophagitis: Secondary | ICD-10-CM | POA: Diagnosis not present

## 2021-08-23 DIAGNOSIS — E860 Dehydration: Secondary | ICD-10-CM | POA: Diagnosis not present

## 2021-08-23 DIAGNOSIS — K6389 Other specified diseases of intestine: Secondary | ICD-10-CM | POA: Diagnosis not present

## 2021-08-23 DIAGNOSIS — I10 Essential (primary) hypertension: Secondary | ICD-10-CM | POA: Diagnosis not present

## 2021-08-23 DIAGNOSIS — F411 Generalized anxiety disorder: Secondary | ICD-10-CM | POA: Diagnosis not present

## 2021-08-23 DIAGNOSIS — E278 Other specified disorders of adrenal gland: Secondary | ICD-10-CM | POA: Diagnosis not present

## 2021-08-23 DIAGNOSIS — I110 Hypertensive heart disease with heart failure: Secondary | ICD-10-CM | POA: Diagnosis not present

## 2021-08-23 DIAGNOSIS — Z880 Allergy status to penicillin: Secondary | ICD-10-CM | POA: Diagnosis not present

## 2021-08-23 DIAGNOSIS — I2510 Atherosclerotic heart disease of native coronary artery without angina pectoris: Secondary | ICD-10-CM | POA: Diagnosis not present

## 2021-08-23 DIAGNOSIS — R109 Unspecified abdominal pain: Secondary | ICD-10-CM | POA: Diagnosis not present

## 2021-08-23 DIAGNOSIS — E871 Hypo-osmolality and hyponatremia: Secondary | ICD-10-CM | POA: Diagnosis not present

## 2021-08-23 DIAGNOSIS — E876 Hypokalemia: Secondary | ICD-10-CM | POA: Diagnosis not present

## 2021-08-23 DIAGNOSIS — E1149 Type 2 diabetes mellitus with other diabetic neurological complication: Secondary | ICD-10-CM | POA: Diagnosis not present

## 2021-08-23 LAB — CBC WITH DIFFERENTIAL/PLATELET
Abs Immature Granulocytes: 0.02 10*3/uL (ref 0.00–0.07)
Basophils Absolute: 0 10*3/uL (ref 0.0–0.1)
Basophils Relative: 1 %
Eosinophils Absolute: 0.1 10*3/uL (ref 0.0–0.5)
Eosinophils Relative: 1 %
HCT: 36 % (ref 36.0–46.0)
Hemoglobin: 13 g/dL (ref 12.0–15.0)
Immature Granulocytes: 0 %
Lymphocytes Relative: 42 %
Lymphs Abs: 2 10*3/uL (ref 0.7–4.0)
MCH: 31.9 pg (ref 26.0–34.0)
MCHC: 36.1 g/dL — ABNORMAL HIGH (ref 30.0–36.0)
MCV: 88.5 fL (ref 80.0–100.0)
Monocytes Absolute: 1.4 10*3/uL — ABNORMAL HIGH (ref 0.1–1.0)
Monocytes Relative: 29 %
Neutro Abs: 1.3 10*3/uL — ABNORMAL LOW (ref 1.7–7.7)
Neutrophils Relative %: 27 %
Platelets: 188 10*3/uL (ref 150–400)
RBC: 4.07 MIL/uL (ref 3.87–5.11)
RDW: 11.9 % (ref 11.5–15.5)
WBC: 4.8 10*3/uL (ref 4.0–10.5)
nRBC: 0 % (ref 0.0–0.2)

## 2021-08-23 LAB — COMPREHENSIVE METABOLIC PANEL
ALT: 18 U/L (ref 0–44)
AST: 20 U/L (ref 15–41)
Albumin: 3.6 g/dL (ref 3.5–5.0)
Alkaline Phosphatase: 52 U/L (ref 38–126)
Anion gap: 11 (ref 5–15)
BUN: 13 mg/dL (ref 8–23)
CO2: 23 mmol/L (ref 22–32)
Calcium: 9.1 mg/dL (ref 8.9–10.3)
Chloride: 106 mmol/L (ref 98–111)
Creatinine, Ser: 0.8 mg/dL (ref 0.44–1.00)
GFR, Estimated: >60 mL/min (ref >60)
Glucose, Bld: 100 mg/dL — ABNORMAL HIGH (ref 70–99)
Potassium: 3.4 mmol/L — ABNORMAL LOW (ref 3.5–5.1)
Sodium: 140 mmol/L (ref 135–145)
Total Bilirubin: 0.7 mg/dL (ref 0.3–1.2)
Total Protein: 6.6 g/dL (ref 6.5–8.1)

## 2021-08-23 LAB — GLUCOSE, CAPILLARY
Glucose-Capillary: 116 mg/dL — ABNORMAL HIGH (ref 70–99)
Glucose-Capillary: 88 mg/dL (ref 70–99)
Glucose-Capillary: 124 mg/dL — ABNORMAL HIGH (ref 70–99)

## 2021-08-23 LAB — MAGNESIUM: Magnesium: 1.7 mg/dL (ref 1.7–2.4)

## 2021-08-23 LAB — STREP PNEUMONIAE URINARY ANTIGEN: Strep Pneumo Urinary Antigen: NEGATIVE

## 2021-08-23 LAB — PHOSPHORUS: Phosphorus: 3.4 mg/dL (ref 2.5–4.6)

## 2021-08-23 MED ORDER — MAGNESIUM SULFATE 2 GM/50ML IV SOLN
2.0000 g | Freq: Once | INTRAVENOUS | Status: AC
  Administered 2021-08-23: 2 g via INTRAVENOUS
  Filled 2021-08-23: qty 50

## 2021-08-23 MED ORDER — GABAPENTIN 300 MG PO CAPS
600.0000 mg | ORAL_CAPSULE | Freq: Three times a day (TID) | ORAL | Status: DC
  Administered 2021-08-23: 600 mg via ORAL
  Filled 2021-08-23: qty 2

## 2021-08-23 MED ORDER — POTASSIUM CHLORIDE CRYS ER 20 MEQ PO TBCR
40.0000 meq | EXTENDED_RELEASE_TABLET | Freq: Two times a day (BID) | ORAL | Status: AC
  Administered 2021-08-23: 40 meq via ORAL
  Filled 2021-08-23: qty 2

## 2021-08-23 MED ORDER — MELATONIN 3 MG PO TABS
3.0000 mg | ORAL_TABLET | Freq: Every evening | ORAL | Status: DC | PRN
  Filled 2021-08-23: qty 1

## 2021-08-23 MED ORDER — GABAPENTIN 300 MG PO CAPS
600.0000 mg | ORAL_CAPSULE | Freq: Three times a day (TID) | ORAL | Status: DC
  Filled 2021-08-23: qty 2

## 2021-08-23 MED ORDER — AZITHROMYCIN 250 MG PO TABS
500.0000 mg | ORAL_TABLET | Freq: Every day | ORAL | Status: DC
  Administered 2021-08-23 – 2021-08-24 (×2): 500 mg via ORAL
  Filled 2021-08-23 (×2): qty 2

## 2021-08-23 MED ORDER — GABAPENTIN 300 MG PO CAPS
300.0000 mg | ORAL_CAPSULE | Freq: Two times a day (BID) | ORAL | Status: DC
  Administered 2021-08-23 (×2): 300 mg via ORAL
  Filled 2021-08-23 (×2): qty 1

## 2021-08-23 MED ORDER — PROCHLORPERAZINE EDISYLATE 10 MG/2ML IJ SOLN
10.0000 mg | Freq: Four times a day (QID) | INTRAMUSCULAR | Status: DC | PRN
  Administered 2021-08-23: 10 mg via INTRAVENOUS
  Filled 2021-08-23: qty 2

## 2021-08-23 MED ORDER — POTASSIUM CHLORIDE CRYS ER 20 MEQ PO TBCR
20.0000 meq | EXTENDED_RELEASE_TABLET | Freq: Once | ORAL | Status: AC
  Administered 2021-08-23: 20 meq via ORAL
  Filled 2021-08-23: qty 1

## 2021-08-23 MED ORDER — IOHEXOL 300 MG/ML  SOLN
100.0000 mL | Freq: Once | INTRAMUSCULAR | Status: AC | PRN
  Administered 2021-08-23: 100 mL via INTRAVENOUS

## 2021-08-23 MED ORDER — LORAZEPAM 2 MG/ML IJ SOLN: 0.5000 mg | Freq: Once | INTRAMUSCULAR | Status: DC

## 2021-08-23 MED ORDER — GABAPENTIN 300 MG PO CAPS: 300.0000 mg | ORAL_CAPSULE | Freq: Two times a day (BID) | ORAL | Status: DC

## 2021-08-23 MED ORDER — GABAPENTIN 100 MG PO CAPS
100.0000 mg | ORAL_CAPSULE | Freq: Two times a day (BID) | ORAL | Status: DC
  Filled 2021-08-23: qty 1

## 2021-08-23 MED ORDER — MELATONIN 3 MG PO TABS
3.0000 mg | ORAL_TABLET | Freq: Every evening | ORAL | Status: DC | PRN
  Administered 2021-08-23: 3 mg via ORAL
  Filled 2021-08-23: qty 1

## 2021-08-23 MED ORDER — SODIUM CHLORIDE 0.9 % IV SOLN: INTRAVENOUS | Status: DC

## 2021-08-23 MED ORDER — MAGNESIUM OXIDE -MG SUPPLEMENT 400 (240 MG) MG PO TABS
800.0000 mg | ORAL_TABLET | Freq: Once | ORAL | Status: AC
  Administered 2021-08-23: 800 mg via ORAL
  Filled 2021-08-23: qty 2

## 2021-08-23 MED ORDER — QUETIAPINE FUMARATE 25 MG PO TABS: 25.0000 mg | ORAL_TABLET | Freq: Every day | ORAL | Status: DC

## 2021-08-23 MED ORDER — GABAPENTIN 300 MG PO CAPS
600.0000 mg | ORAL_CAPSULE | Freq: Three times a day (TID) | ORAL | Status: DC
  Administered 2021-08-23 – 2021-08-24 (×2): 600 mg via ORAL
  Filled 2021-08-23 (×2): qty 2

## 2021-08-23 MED ORDER — POTASSIUM CHLORIDE CRYS ER 20 MEQ PO TBCR: 40.0000 meq | EXTENDED_RELEASE_TABLET | Freq: Two times a day (BID) | ORAL | Status: DC

## 2021-08-23 MED ORDER — GUAIFENESIN ER 600 MG PO TB12
1200.0000 mg | ORAL_TABLET | Freq: Two times a day (BID) | ORAL | Status: DC
Start: 2021-08-23 — End: 2021-08-24
  Administered 2021-08-23 – 2021-08-24 (×2): 1200 mg via ORAL
  Filled 2021-08-23 (×2): qty 2

## 2021-08-23 NOTE — Progress Notes (Signed)
Heart Failure Nurse Navigator Progress Note  PCP: Martinique, Betty G, MD PCP-Cardiologist: [name] Admission Diagnosis: None Admitted from: [facility] via [facility]  Presentation:   [name] presented from [facility] with ? NSTEMI and abdominal pain. Troponin 779,repeat troponin was 86,  BNP 1121, history of untreated CAD, patient was light headed, having palpitations, and shortness of breath. BP 113/52, placed on oxygen, per cardiology not consistent with ACS, likely demand ischemia in the setting of dehydration and no chest pain.   Education was done with patient on the sign and symptoms of heart failure, daily weights, diet/ fluid restrictions and importance of taking all medications as prescribed and following up with all medical appointments. Patient had very little interaction with conversation. Patient stated that she would not be able to come for a HF TOC hospital follow up appointment until 09/17/21 @ 3 pm.   ECHO/ LVEF: 40-45 %  Clinical Course:  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HCC)    Anxiety    Arthritis    Diabetes mellitus without complication (HCC)    GERD (gastroesophageal reflux disease)    Heart murmur    Hyperlipidemia    Hypertension    Neuromuscular disorder (HCC)    Palpitations    Pneumonia    PONV (postoperative nausea and vomiting)    PTSD (post-traumatic stress disorder)    Ringing of ears, left      Social History   Socioeconomic History   Marital status: Widowed    Spouse name: Not on file   Number of children: 1   Years of education: Not on file   Highest education level: High school graduate  Occupational History   Occupation: retired    Comment: work part time  Tobacco Use   Smoking status: Never   Smokeless tobacco: Never  Scientific laboratory technician Use: Never used  Substance and Sexual Activity   Alcohol use: No   Drug use: No   Sexual activity: Not on file  Other Topics Concern   Not on file   Social History Narrative   Not on file   Social Determinants of Health   Financial Resource Strain: Low Risk    Difficulty of Paying Living Expenses: Not hard at all  Food Insecurity: No Food Insecurity   Worried About Charity fundraiser in the Last Year: Never true   [place] in the Last Year: Never true  Transportation Needs: No Transportation Needs   Lack of Transportation (Medical): No   Lack of Transportation (Non-Medical): No  Physical Activity: Insufficiently Active   Days of Exercise per Week: 2 days   Minutes of Exercise per Session: 40 min  Stress: No Stress Concern Present   Feeling of Stress : Not at all  Social Connections: Moderately Integrated   Frequency of Communication with Friends and Family: Twice a week   Frequency of Social Gatherings with Friends and Family: Twice a week   Attends Religious Services: More than 4 times per year   Active Member of [employer] or Organizations: Yes   Attends Archivist Meetings: 1 to 4 times per year   Marital Status: Widowed   Education Assessment and Provision:  Detailed education and instructions provided on heart failure disease management including the following:  Signs and symptoms of Heart Failure When to call the physician Importance of daily weights Low sodium diet Fluid restriction Medication management Anticipated future follow-up appointments  Patient education  given on each of the above topics.  Patient acknowledges understanding via teach back method and acceptance of all instructions.  Education Materials:  "Living Better With Heart Failure" Booklet, HF zone tool, & Daily Weight Tracker Tool.  Patient has scale at home: yes Patient has pill box at home: NA    High Risk Criteria for Readmission and/or Poor Patient Outcomes: Heart failure hospital admissions (last 6 months):  0 No Show rate: 0 Difficult social situation: No Demonstrates medication adherence: yes Primary Language:  english Literacy level: reading, writing, and comprehension.  Barriers of Care:   Diet/ fluid restrictions (soda) Daily weights  Considerations/Referrals:   Referral made to Heart Failure Pharmacist Stewardship: yes Referral made to Heart Failure CSW/NCM TOC: No Referral made to Heart & Vascular TOC clinic: Yes, 09/17/21 ( 1st appt. Patient could make for HF TOC follow up)  Items for Follow-up on DC/TOC: Diet/ fluid restrictions ( soda) Daily weights   Earnestine Leys, BSN, RN Heart Failure Leisure centre manager Chat Only

## 2021-08-23 NOTE — Progress Notes (Addendum)
Progress Note  Patient Name: [name] Date of Encounter: 08/23/2021  [facility] Cardiologist: Dorris Carnes, MD   Subjective   Patient has some mild chest pain when breathing on the right, lateral side of her chest. Denies any left sided chest pain, tightness, or pressure. Breathing is OK. Concerned about her diarrhea and nausea   Inpatient Medications    Scheduled Meds:  aspirin EC  81 mg Oral Daily   azithromycin  500 mg Oral Daily   ezetimibe  10 mg Oral Daily   gabapentin  300 mg Oral BID   insulin aspart  0-6 Units Subcutaneous TID WC   metoprolol succinate  12.5 mg Oral Daily   potassium chloride SA  20 mEq Oral Daily   QUEtiapine  25 mg Oral QHS   Continuous Infusions:  cefTRIAXone (ROCEPHIN)  IV 1 g (08/23/21 0900)   PRN Meds: acetaminophen **OR** acetaminophen, clonazePAM, ondansetron (ZOFRAN) IV   Vital Signs    Vitals:   08/23/21 0049 08/23/21 0421 08/23/21 0752 08/23/21 0834  BP: (!) 139/47 (!) 154/97 118/62   Pulse: 79   72  Resp: 18  19   Temp: (!) 97.5 F (36.4 C) (!) 97.4 F (36.3 C) 97.7 F (36.5 C)   TempSrc: Oral Oral Axillary   SpO2: 98% 98%    Weight:  78.6 kg     No intake or output data in the 24 hours ending 08/23/21 0954    08/23/2021    4:21 AM 08/09/2021    3:11 PM 07/15/2021    3:56 PM  Last 3 Weights  Weight (lbs) 173 lb 4.8 oz 183 lb 181 lb 3.2 oz  Weight (kg) 78.608 kg 83.008 kg 82.192 kg      Telemetry    Sinus rhythm with frequent PVCs and PACs - Personally Reviewed  ECG    No new tracings - Personally Reviewed  Physical Exam   GEN: No acute distress.  Appears uncomfortable Neck: No JVD Cardiac: Irregular rate and rhythm, no murmurs, rubs, or gallops.  Respiratory: No increased wob, clear on anterior lung exam  GI: Soft, nontender, non-distended  MS: No edema; No deformity. Neuro:  Nonfocal  Psych: Normal affect   Labs    High Sensitivity Troponin:   Recent Labs  Lab 08/22/21 0212 08/22/21 0426   TROPONINIHS 86* 80*     Chemistry Recent Labs  Lab 08/22/21 0212 08/22/21 0844 08/23/21 0213  NA 138 139 140  K 3.2* 3.6 3.4*  CL 103 104 106  CO2 '26 26 23  '$ GLUCOSE 107* 113* 100*  BUN '21 18 13  '$ CREATININE 1.21* 0.94 0.80  CALCIUM 8.8* 8.7* 9.1  MG 1.9 2.2 1.7  PROT 6.6 6.3* 6.6  ALBUMIN 3.7 3.5 3.6  AST '20 22 20  '$ ALT '17 17 18  '$ ALKPHOS 53 53 52  BILITOT 1.0 0.5 0.7  GFRNONAA 49* >60 >60  ANIONGAP '9 9 11    '$ Lipids No results for input(s): CHOL, TRIG, HDL, LABVLDL, LDLCALC, CHOLHDL in the last 168 hours.  Hematology Recent Labs  Lab 08/22/21 0212 08/22/21 0844 08/23/21 0213  WBC 6.1 5.1 4.8  RBC 3.99 3.91 4.07  HGB 12.7 12.5 13.0  HCT 36.9 36.0 36.0  MCV 92.5 92.1 88.5  MCH 31.8 32.0 31.9  MCHC 34.4 34.7 36.1*  RDW 12.1 12.1 11.9  PLT 165 157 188   Thyroid No results for input(s): TSH, FREET4 in the last 168 hours.  BNP Recent Labs  Lab 08/22/21  0212  BNP 172.3*    DDimer No results for input(s): DDIMER in the last 168 hours.   Radiology    DG Chest Portable 1 View  Result Date: 08/22/2021 CLINICAL DATA:  Chest pain EXAM: PORTABLE CHEST 1 VIEW COMPARISON:  08/21/2021 FINDINGS: Cardiac shadow is stable. Aortic calcifications are noted. The lungs are well aerated bilaterally. Patchy airspace opacity is noted particularly in the right upper lobe new from the prior exam. No sizable effusion is seen. No bony abnormality is noted. IMPRESSION: Increasing right upper lobe airspace opacity. Electronically Signed   By: Inez Catalina M.D.   On: 08/22/2021 02:36   ECHOCARDIOGRAM COMPLETE  Result Date: 08/22/2021    ECHOCARDIOGRAM REPORT   Patient Name:   [name] [facility] Date of Exam: 08/22/2021 Medical Rec #:  [id number]          Height:       68.0 in Accession #:    [id number]         Weight:       183.0 lb Date of Birth:  02-04-1951          BSA:          1.968 m Patient Age:    71 years           BP:           136/54 mmHg Patient Gender: F                  HR:            70 bpm. Exam Location:  Inpatient Procedure: 2D Echo, Cardiac Doppler, Color Doppler and Intracardiac            Opacification Agent Indications:    NSTEMI  History:        Patient has prior history of Echocardiogram examinations, most                 recent 01/06/2021. CHF, CAD; Risk Factors:Diabetes, Dyslipidemia                 and Hypertension.  Sonographer:    Clayton Lefort RDCS (AE) Referring Phys: [id number] Rhetta Mura  Sonographer Comments: Suboptimal subcostal window and Technically difficult study due to poor echo windows. IMPRESSIONS  1. Septal apical and inferior apical hypokinesis . Left ventricular ejection fraction, by estimation, is 40 to 45%. The left ventricle has mildly decreased function. The left ventricle demonstrates regional wall motion abnormalities (see scoring diagram/findings for description). The left ventricular internal cavity size was mildly dilated. There is mild left ventricular hypertrophy. Left ventricular diastolic parameters were normal.  2. Right ventricular systolic function is normal. The right ventricular size is normal.  3. Left atrial size was moderately dilated.  4. The mitral valve is abnormal. Mild mitral valve regurgitation. No evidence of mitral stenosis.  5. The aortic valve is tricuspid. There is moderate calcification of the aortic valve. There is moderate thickening of the aortic valve. Aortic valve regurgitation is not visualized. Mild aortic valve stenosis.  6. The inferior vena cava is normal in size with greater than 50% respiratory variability, suggesting right atrial pressure of 3 mmHg. FINDINGS  Left Ventricle: Septal apical and inferior apical hypokinesis. Left ventricular ejection fraction, by estimation, is 40 to 45%. The left ventricle has mildly decreased function. The left ventricle demonstrates regional wall motion abnormalities. Definity contrast agent was given IV to delineate the left ventricular endocardial borders. The left ventricular  internal cavity size was  mildly dilated. There is mild left ventricular hypertrophy. Left ventricular diastolic parameters were normal. Right Ventricle: The right ventricular size is normal. No increase in right ventricular wall thickness. Right ventricular systolic function is normal. Left Atrium: Left atrial size was moderately dilated. Right Atrium: Right atrial size was normal in size. Pericardium: There is no evidence of pericardial effusion. Mitral Valve: The mitral valve is abnormal. There is mild thickening of the mitral valve leaflet(s). There is mild calcification of the mitral valve leaflet(s). Mild mitral valve regurgitation. No evidence of mitral valve stenosis. Tricuspid Valve: The tricuspid valve is normal in structure. Tricuspid valve regurgitation is mild . No evidence of tricuspid stenosis. Aortic Valve: The aortic valve is tricuspid. There is moderate calcification of the aortic valve. There is moderate thickening of the aortic valve. Aortic valve regurgitation is not visualized. Mild aortic stenosis is present. Aortic valve mean gradient measures 7.0 mmHg. Aortic valve peak gradient measures 12.0 mmHg. Aortic valve area, by VTI measures 1.88 cm. Pulmonic Valve: The pulmonic valve was normal in structure. Pulmonic valve regurgitation is not visualized. No evidence of pulmonic stenosis. Aorta: The aortic root is normal in size and structure. Venous: The inferior vena cava is normal in size with greater than 50% respiratory variability, suggesting right atrial pressure of 3 mmHg. IAS/Shunts: No atrial level shunt detected by color flow Doppler.  LEFT VENTRICLE PLAX 2D LVIDd:         4.30 cm   Diastology LVIDs:         3.50 cm   LV e' medial:    4.57 cm/s LV PW:         1.10 cm   LV E/e' medial:  20.8 LV IVS:        1.30 cm   LV e' lateral:   9.36 cm/s LVOT diam:     2.20 cm   LV E/e' lateral: 10.2 LV SV:         68 LV SV Index:   34 LVOT Area:     3.80 cm  RIGHT VENTRICLE RV Basal diam:  2.70 cm  RV S prime:     13.50 cm/s TAPSE (M-mode): 2.4 cm LEFT ATRIUM             Index        RIGHT ATRIUM           Index LA diam:        4.30 cm 2.18 cm/m   RA Area:     14.90 cm LA Vol (A2C):   58.7 ml 29.83 ml/m  RA Volume:   34.30 ml  17.43 ml/m LA Vol (A4C):   52.0 ml 26.42 ml/m LA Biplane Vol: 56.0 ml 28.46 ml/m  AORTIC VALVE AV Area (Vmax):    1.80 cm AV Area (Vmean):   1.73 cm AV Area (VTI):     1.88 cm AV Vmax:           173.00 cm/s AV Vmean:          119.000 cm/s AV VTI:            0.360 m AV Peak Grad:      12.0 mmHg AV Mean Grad:      7.0 mmHg LVOT Vmax:         82.10 cm/s LVOT Vmean:        54.100 cm/s LVOT VTI:          0.178 m LVOT/AV VTI ratio: 0.49  AORTA Ao Root  diam: 3.20 cm Ao Asc diam:  3.40 cm MITRAL VALVE MV Area (PHT): 4.08 cm    SHUNTS MV Decel Time: 186 msec    Systemic VTI:  0.18 m MV E velocity: 95.10 cm/s  Systemic Diam: 2.20 cm MV A velocity: 85.90 cm/s MV E/A ratio:  1.11 Jenkins Rouge MD Electronically signed by Jenkins Rouge MD Signature Date/Time: 08/22/2021/5:33:38 PM    Final     Cardiac Studies   Echocardiogram 08/22/2021  1. Septal apical and inferior apical hypokinesis . Left ventricular  ejection fraction, by estimation, is 40 to 45%. The left ventricle has  mildly decreased function. The left ventricle demonstrates regional wall  motion abnormalities (see scoring  diagram/findings for description). The left ventricular internal cavity  size was mildly dilated. There is mild left ventricular hypertrophy. Left  ventricular diastolic parameters were normal.   2. Right ventricular systolic function is normal. The right ventricular  size is normal.   3. Left atrial size was moderately dilated.   4. The mitral valve is abnormal. Mild mitral valve regurgitation. No  evidence of mitral stenosis.   5. The aortic valve is tricuspid. There is moderate calcification of the  aortic valve. There is moderate thickening of the aortic valve. Aortic  valve regurgitation is  not visualized. Mild aortic valve stenosis.   6. The inferior vena cava is normal in size with greater than 50%  respiratory variability, suggesting right atrial pressure of 3 mmHg.   Patient Profile     71 y.o. female with a hx of CAD s/p LHC (Dec 2022 - subtotal mid LAD, complex LCX-not amenable for PCI, was medically mx and followed CTSx for possible CABG), HTN, HLD, Palpitations/PVCs, Dm-2, PTSD who was seen 08/22/2021 for the evaluation of elevated troponin at the request of ER doc.  Assessment & Plan    Elevated Troponin  - hsTn 86>>80 - Minimal elevation with a flat trend, patient denies chest pain-- not consistent with ACS. Likely demand ischemia in the setting of dehydration (vomiting/diarrhea), AKI, acute infection   CAD  HLD - Most recent cardiac cath from 03/08/2021 above-- patient has been seen by CVTS and she has a follow up appointment with Dr. Prescott Gum in June to discuss medical vs surgical management  - Patient denies any chest pain - Continue ASA, metoprolol - Has documented intolerance of multiple statins, continue Zetia. Will likely need lipid clinic referral at discharge   PVCs  - Patient has PVCs/ventricular bigeminy on EKG and telemetry. Reports feeling a fluttering in her chest   - Continue metoprolol  - Maintain K>4, mag>2. Patient was hypokalemic on presentation (3.2) in the setting of GI loss.  K is being supplemented per primary  and K is improving    Ischemic Cardiomyopathy  Chronic HFrEF  - Most recent echo in 03/2021 showed EF 45-50%. Echo this admission with EF 40-45%  - Patient receiving IV fluids due to her vomiting/diarrhea and dehydration, AKI monitor patient closely for signs of volume overload. Patient euvolemic on exam  - Continue metoprolol  - Consider starting ARB/ARNI and other GDMT in the outpatient setting when patient is over her acute illness. I am hesitant to start new medications while patient has active n/diarrhea and PNA     Otherwise  per primary  - Nausea, vomiting, diarrhea  - CAP  - Generalized Anxiety Disorder  - Type 2 DM  - GERD    For questions or updates, please contact [facility] Please consult [email or web address] for  contact info under      Signed, Margie Billet, PA-C  08/23/2021, 9:54 AM    History and all data above reviewed.  Patient examined.  She has had some atypical right sided chest pain.  No SOB  I agree with the findings as above.  The patient exam reveals COR:RRR  ,  Lungs: Clear  ,  Abd: Positive bowel sounds, no rebound no guarding, Ext No edema   .  All available labs, radiology testing, previous records reviewed. Agree with documented assessment and plan. CAD:  Not active ischemia.  I did look at the cath films and had further conversation about revascularization strategies vs medical management.  She wants to continue medical management and follow up another discussion with Dr. Prescott Gum about possible CABG in the future.  She does not report unstable symptoms.   Ischemic cardiomyopathy: Agree that now is not a good time to titrate meds but she will need this as an out patient.    We will see as needed this admission.   Jeneen Rinks Worthington Cruzan  11:39 AM  08/23/2021

## 2021-08-23 NOTE — Evaluation (Signed)
Physical Therapy Evaluation/ Discharge Patient Details Name: [name] MRN: [id number] DOB: Oct 10, 1950 Today's Date: 08/23/2021  History of Present Illness  71 yo female admitted 5/18 with Abd pain, nausea, diarrhea, elevated troponin with NSTEMI and questionable GIB. PMhx: DM, HTN, PTSD, anxiety, spinal fusion, HLD, CAD  Clinical Impression  Pt very pleasant and reports RLS but wanting to get up and move to help her legs. Pt limited stool incontinence due to diarrhea without chest pain throughout session. Pt able to perform transfers, gait and functional mobility without physical assist or cues. Pt at baseline functional level and do not recommend further therapy intervention. Educated pt for continued mobility acutely and need to perform progressive gait to maintain function. Pt aware and agreeable to therapy signing off.   HR 72-89       Recommendations for follow up therapy are one component of a multi-disciplinary discharge planning process, led by the attending physician.  Recommendations may be updated based on patient status, additional functional criteria and insurance authorization.  Follow Up Recommendations No PT follow up    Assistance Recommended at Discharge None  Patient can return home with the following       Equipment Recommendations None recommended by PT  Recommendations for Other Services       Functional Status Assessment Patient has not had a recent decline in their functional status     Precautions / Restrictions Precautions Precautions: None      Mobility  Bed Mobility Overal bed mobility: Independent                  Transfers Overall transfer level: Independent                      Ambulation/Gait Ambulation/Gait assistance: Independent Gait Distance (Feet): 150 Feet Assistive device: None Gait Pattern/deviations: WFL(Within Functional Limits)   Gait velocity interpretation: >2.62 ft/sec, indicative of community  ambulatory      Stairs            Wheelchair Mobility    Modified Rankin (Stroke Patients Only)       Balance Overall balance assessment: No apparent balance deficits (not formally assessed)                                           Pertinent Vitals/Pain Pain Assessment Pain Assessment: No/denies pain    Home Living Family/patient expects to be discharged to:: Private residence Living Arrangements: Alone Available Help at Discharge: Family;Friend(s);Available PRN/intermittently Type of Home: Independent living facility Home Access: Level entry       Home Layout: One level Home Equipment: Grab bars - tub/shower;Grab bars - toilet;Hand held shower head      Prior Function Prior Level of Function : Independent/Modified Independent               ADLs Comments: drives and cooks, works part time as Restaurant manager, fast food        Extremity/Trunk Assessment   Upper Extremity Assessment Upper Extremity Assessment: Overall WFL for tasks assessed    Lower Extremity Assessment Lower Extremity Assessment: Overall WFL for tasks assessed    Cervical / Trunk Assessment Cervical / Trunk Assessment: Normal  Communication   Communication: No difficulties  Cognition Arousal/Alertness: Awake/alert Behavior During Therapy: WFL for tasks assessed/performed Overall Cognitive Status: Within Functional Limits for tasks assessed  General Comments      Exercises     Assessment/Plan    PT Assessment Patient does not need any further PT services  PT Problem List         PT Treatment Interventions      PT Goals (Current goals can be found in the Care Plan section)  Acute Rehab PT Goals Patient Stated Goal: feel better PT Goal Formulation: All assessment and education complete, DC therapy    Frequency       Co-evaluation               AM-PAC PT "6 Clicks" Mobility   Outcome Measure Help needed turning from your back to your side while in a flat bed without using bedrails?: None Help needed moving from lying on your back to sitting on the side of a flat bed without using bedrails?: None Help needed moving to and from a bed to a chair (including a wheelchair)?: None Help needed standing up from a chair using your arms (e.g., wheelchair or bedside chair)?: None Help needed to walk in hospital room?: None Help needed climbing 3-5 steps with a railing? : None 6 Click Score: 24    End of Session   Activity Tolerance: Patient tolerated treatment well Patient left: in chair;with call bell/phone within reach;with nursing/sitter in room;with chair alarm set Nurse Communication: Mobility status PT Visit Diagnosis: Other abnormalities of gait and mobility (R26.89)    Time: 1914-7829 PT Time Calculation (min) (ACUTE ONLY): 22 min   Charges:   PT Evaluation $PT Eval Low Complexity: [place], PT Acute Rehabilitation Services Pager: [phone] Office: [place] 08/23/2021, 1:52 PM

## 2021-08-23 NOTE — TOC Benefit Eligibility Note (Signed)
Patient Research scientist (life sciences) completed.     The patient is currently admitted and upon discharge could be taking ENTRESTO 24-26 MG.   The current 30 day co-pay is, $37.   The patient is currently admitted and upon discharge could be taking JARDIANCE 10 MG.   The current 30 day co-pay is, $37.   The patient is currently admitted and upon discharge could be taking FARXIGA 10 MG.   The current 30 day co-pay is, $37.   The patient is insured through [employer].

## 2021-08-23 NOTE — Care Management Obs Status (Signed)
[place] NOTIFICATION   Patient Details  Name: [name] MRN: [id number] Date of Birth: February 18, 1951   Medicare Observation Status Notification Given:  Yes    Bethena Roys, RN 08/23/2021, 2:05 PM

## 2021-08-23 NOTE — Progress Notes (Addendum)
Heart Failure Stewardship Pharmacist Progress Note   PCP: Martinique, Betty G, MD PCP-Cardiologist: Dorris Carnes, MD    HPI:  71 yo female with PMH significant for HLD, HTN, T2DM, palpitations/PVCs, PTSD, and CAD s/p LHC 03/2021 showing severe obstructive multi-vessel disease (99% LAD and severe stenosis of Lcx involving bifurcation of 4 branches).  Patient underwent stress test 01/2021 and found to be high risk.  Taken for [facility] in December and found to have mvCAD as above. She was medically treated and followed by CT surgery for possible outpatient CABG.  She presented to [facility] 05/18 with N/V/D x3 days as well as dyspnea, palpitations, and light-headedness. Transferred to [facility] with concern for NSTEMI with elevated troponins.  Occasional PVCs/ventricular bigeminy on EKG.  05/19 Echo with EF 40-45% (reduced from 45-50% in 03/2021 and 55-60% in 09/2020), mild LVH, normal RV, and new mild MR as well as new mild aortic valve stenosis from 03/2021 Echo.  CXR with no pulmonary congestion.  No JVD or edema on exam or dyspnea reported.   Current HF Medications: Beta Blocker: metoprolol succinate 12.5 mg daily Other: potassium 20 mEq daily + 20 mEq x1  Prior to admission HF Medications: Beta blocker: metoprolol succinate 12.5 mg daily Other: potassium 20 mEq daily  Pertinent Lab Values: Serum creatinine 0.8, BUN 13, Potassium 3.4, Sodium 140, BNP 1121, Magnesium 1.7, A1c 6.3%  Vital Signs: Weight: same as admissoin (admission weight: 173.3 lbs) Blood pressure: 110-50/60-90  Heart rate: 60-70  I/O: none charted yesterday  Medication Assistance / Insurance Benefits Check: Does the patient have prescription insurance?  Yes Type of insurance plan: BCBS Medicare  Does the patient qualify for medication assistance through manufacturers or grants?   Yes Eligible grants and/or patient assistance programs: pending Medication assistance applications in progress: pending  Medication assistance  applications approved: pending  Outpatient Pharmacy:  Prior to admission outpatient pharmacy: [facility] Is the patient willing to use [facility] at discharge? Pending Is the patient willing to transition their outpatient pharmacy to utilize a [facility] outpatient pharmacy?   Pending    Assessment: 1. Acute on chronic CHF with newly reduced LVEF 40-45%, due to ischemic disease. NYHA class II symptoms. - Agree with no diuresis. Will continue to monitor volume status. - Continue daily potassium supplementation 20 mEq  - Continue metoprolol succinate 12.5 mg daily - continue to monitor bradycardia - Hold off on Entresto with labile BP - wait until stabilizes, likely after acute illness resolves - Will eventually add MRA and SGLT2i pending BP/renal response    Plan: 1) Medication changes recommended at this time: - Give additional 56mq potassium to keep K > 4 - Give 2g IV Mg  2) Patient assistance: - Copays = EDelene Loll/ Farxiga $37 - Holding off on patient assistance until patient is started on agents  3)  Education  - Stop PTA HTCZ 25 mg daily at discharge - To be completed prior to discharge  ELaurey Arrow PharmD PGY1 Pharmacy Resident 08/23/2021  8:21 AM

## 2021-08-23 NOTE — Progress Notes (Signed)
PROGRESS NOTE    [name]  WER:[id number] DOB: Nov 28, 1950 DOA: 08/22/2021 PCP: Martinique, Betty G, MD   Brief Narrative:  The patient is a 71 year old female with a past medical history significant for but not limited to CAD, generalized anxiety disorder, diabetes mellitus type 2, hypertension, hyperlipidemia, chronic systolic CHF as well as other comorbidities who presented with chief complaint of nausea, vomiting and diarrhea.  Incidentally at [facility] patient noted to have elevated troponin and so she was transferred to [facility] for concern for an NSTEMI.  She reports 4 days of nausea vomiting and diarrhea and states that nausea is been intermittent in nature but she had 2-3 daily episodes of nonbloody nonbilious emesis with recent occurring episode just prior to presenting to the ED department at [facility].  She also states that she has had 3-4 daily bowel movements of watery but dark color black tarry stool and denies any abdominal pain.   She did have some chest discomfort but after she was placed on antibiotics for chest discomfort has improved and she is able to breathe easily without any issues.  She was transferred to [name] given her history of CAD and most recent coronary angiography in December 2022 revealing multivessel CAD was not amenable to PCI.  Currently undergoing medical management has been on daily aspirin and metoprolol succinate.  She follows with [name] of [facility] cardiology and most recent echocardiogram showed an EF of 40 to 45%.  At [facility] she had a notable elevated troponin of 786 but here is only elevated 86 and then trended down to 88.  Echocardiogram is being repeated today but cardiology here evaluated and felt that she had a minimal elevation with a flat trend and she denies any current chest clear now.  They felt that her elevated troponin is not consistent with ACS and likely demand ischemia in the setting of dehydration given her vomiting and  diarrhea as well as AKI and acute infection.  Her heparin drip has now been stopped.  Cardiology is following closely and they feel the patient is euvolemic now and recommending holding her diuretics given her AKI.  Cardiology feels that she would ideally be on GDMT more than just metoprolol but negative consider starting ARB and ARNI this admission if EF is still reduced and AKI improved.   Given her black stools we have notified GI for further evaluation recommendations.  GI evaluated and they feel that her clinical presentation is most consistent with infectious gastroenteritis and they are unsure if she truly had melena versus just dark stools but hemoglobin/hematocrit is stable.  Now she is having significant amount of diarrhea still having crampy abdominal pain so we will get a CT scan of the abdomen pelvis.  GI recommends conservative management at this point and continue to advance diet as tolerated and recommending Bentyl since abdominal cramping is returned   Assessment and Plan:  Elevated Troponin CAD -Elevated on admission was 86 and then trended around 80 -Denies any chest pain now and cardiology was consulted and feel that this is not consistent with ACS and felt that this is demand ischemia in the setting of her dehydration from vomiting and diarrhea as well as AKI and acute community-acquired pneumonia -Echo done and showed a mildly diminished EF from last time and is now 40 to 45% with her most recent echo being 45 to 45% -Continue to monitor volume status and cardiology recommending no further work-up for her elevated troponin -Patient  had a cardiac cath in December 2022 and she has been evaluated by cardiovascular surgery and she has a follow-up appointment with [name] in June to discuss medical versus surgical management of her CAD -Currently denies any chest pain and cardiology recommends continuing aspirin and metoprolol   Dark Stools with concern for GIB -GI evaluated  and feels that she since her hemoglobin is stable we will pursue conservative measures and they recommend outpatient colonoscopy   Nausea, Vomiting, and Diarrhea -Was improving but now having significant amount of diarrhea again and symptoms are related to viral gastroenteritis -Continue with Zofran and Compazine -Check CT scan of the abdomen pelvis   Hypokalemia -Mild at 3.4 -Replete with po KCL 40 mEQ BID x2 -Continue to Monitor and Replete as Necessary -Repeat CMP in the AM    AKI, improving  -Patient's BUN/Cr went from 21/1.21 -> 18/0.93 -> 13/0.90 -Avoid further nephrotoxic medications, contrast dyes, hypotension and dehydration to ensure adequate renal perfusion and renally adjust medications -IV fluid hydration was given -Continue to monitor and trend and repeat CMP in a.m.   CAP -Zithromax and ceftriaxone -Repeat chest x-ray in the a.m. -Flutter valve, incentive spirometer and will add guaifenesin 1200 mg p.o. twice daily  GAD -C/w Clonazepam 0.5 mg po BIDprn Anxiety    Type 2 Diabetes Mellitus -CBG's ranging from 88-118 -C/w Very Sensitive Novolog SSI  -Continue gabapentin 600 mg p.o. 3 times daily   GERD/GI Prophylaxis -C/w PPI    Essential HTN -Continue with metoprolol -Continue to Monitor BP per Protocol   HLD -She is statin intolerant and the cardiology team is recommending continuing Zetia and following up in outpatient lipid clinic   Chronic Systolic CHF -BNP was elevated at 172 -Cardiology is following and had a recent cath and 03/08/2021 -Cardiology recommended checking echo and it showed a repeat EF of 40 to 45% -Continue to monitor for signs and symptoms of volume overload and; patient currently euvolemic to little dry -Continue metoprolol -Cardiology is recommending ARB/ARNI and other GDMT in the outpatient setting when she is over acute illness as they are hesitant to start new medications while she has active nausea vomiting diarrhea no  pneumonia  DVT prophylaxis: SCDs Start: 08/22/21 0358    Code Status: Full Code Family Communication: No family present at bedside   Disposition Plan:  Level of care: Telemetry Cardiac Status is: Inpatient Remains inpatient appropriate because: Remains Nauseous and having significant amount of Diarrhea   Consultants:  Cardiology Gastroenterology  Procedures:  ECHOCardiogram IMPRESSIONS     1. Septal apical and inferior apical hypokinesis . Left ventricular  ejection fraction, by estimation, is 40 to 45%. The left ventricle has  mildly decreased function. The left ventricle demonstrates regional wall  motion abnormalities (see scoring  diagram/findings for description). The left ventricular internal cavity  size was mildly dilated. There is mild left ventricular hypertrophy. Left  ventricular diastolic parameters were normal.   2. Right ventricular systolic function is normal. The right ventricular  size is normal.   3. Left atrial size was moderately dilated.   4. The mitral valve is abnormal. Mild mitral valve regurgitation. No  evidence of mitral stenosis.   5. The aortic valve is tricuspid. There is moderate calcification of the  aortic valve. There is moderate thickening of the aortic valve. Aortic  valve regurgitation is not visualized. Mild aortic valve stenosis.   6. The inferior vena cava is normal in size with greater than 50%  respiratory variability, suggesting right  atrial pressure of 3 mmHg.   FINDINGS   Left Ventricle: Septal apical and inferior apical hypokinesis. Left  ventricular ejection fraction, by estimation, is 40 to 45%. The left  ventricle has mildly decreased function. The left ventricle demonstrates  regional wall motion abnormalities.  Definity contrast agent was given IV to delineate the left ventricular  endocardial borders. The left ventricular internal cavity size was mildly  dilated. There is mild left ventricular hypertrophy. Left  ventricular  diastolic parameters were normal.   Right Ventricle: The right ventricular size is normal. No increase in  right ventricular wall thickness. Right ventricular systolic function is  normal.   Left Atrium: Left atrial size was moderately dilated.   Right Atrium: Right atrial size was normal in size.   Pericardium: There is no evidence of pericardial effusion.   Mitral Valve: The mitral valve is abnormal. There is mild thickening of  the mitral valve leaflet(s). There is mild calcification of the mitral  valve leaflet(s). Mild mitral valve regurgitation. No evidence of mitral  valve stenosis.   Tricuspid Valve: The tricuspid valve is normal in structure. Tricuspid  valve regurgitation is mild . No evidence of tricuspid stenosis.   Aortic Valve: The aortic valve is tricuspid. There is moderate  calcification of the aortic valve. There is moderate thickening of the  aortic valve. Aortic valve regurgitation is not visualized. Mild aortic  stenosis is present. Aortic valve mean gradient  measures 7.0 mmHg. Aortic valve peak gradient measures 12.0 mmHg. Aortic  valve area, by VTI measures 1.88 cm.   Pulmonic Valve: The pulmonic valve was normal in structure. Pulmonic valve  regurgitation is not visualized. No evidence of pulmonic stenosis.   Aorta: The aortic root is normal in size and structure.   Venous: The inferior vena cava is normal in size with greater than 50%  respiratory variability, suggesting right atrial pressure of 3 mmHg.   IAS/Shunts: No atrial level shunt detected by color flow Doppler.      LEFT VENTRICLE  PLAX 2D  LVIDd:         4.30 cm   Diastology  LVIDs:         3.50 cm   LV e' medial:    4.57 cm/s  LV PW:         1.10 cm   LV E/e' medial:  20.8  LV IVS:        1.30 cm   LV e' lateral:   9.36 cm/s  LVOT diam:     2.20 cm   LV E/e' lateral: 10.2  LV SV:         68  LV SV Index:   34  LVOT Area:     3.80 cm      RIGHT VENTRICLE  RV Basal  diam:  2.70 cm  RV S prime:     13.50 cm/s  TAPSE (M-mode): 2.4 cm   LEFT ATRIUM             Index        RIGHT ATRIUM           Index  LA diam:        4.30 cm 2.18 cm/m   RA Area:     14.90 cm  LA Vol (A2C):   58.7 ml 29.83 ml/m  RA Volume:   34.30 ml  17.43 ml/m  LA Vol (A4C):   52.0 ml 26.42 ml/m  LA Biplane Vol: 56.0 ml 28.46 ml/m  AORTIC VALVE  AV Area (Vmax):    1.80 cm  AV Area (Vmean):   1.73 cm  AV Area (VTI):     1.88 cm  AV Vmax:           173.00 cm/s  AV Vmean:          119.000 cm/s  AV VTI:            0.360 m  AV Peak Grad:      12.0 mmHg  AV Mean Grad:      7.0 mmHg  LVOT Vmax:         82.10 cm/s  LVOT Vmean:        54.100 cm/s  LVOT VTI:          0.178 m  LVOT/AV VTI ratio: 0.49     AORTA  Ao Root diam: 3.20 cm  Ao Asc diam:  3.40 cm   MITRAL VALVE  MV Area (PHT): 4.08 cm    SHUNTS  MV Decel Time: 186 msec    Systemic VTI:  0.18 m  MV E velocity: 95.10 cm/s  Systemic Diam: 2.20 cm  MV A velocity: 85.90 cm/s  MV E/A ratio:  1.11   Antimicrobials:  Anti-infectives (From admission, onward)    Start     Dose/Rate Route Frequency Ordered Stop   08/23/21 1000  azithromycin (ZITHROMAX) tablet 500 mg        500 mg Oral Daily 08/23/21 0806     08/22/21 0600  cefTRIAXone (ROCEPHIN) 1 g in sodium chloride 0.9 % 100 mL IVPB        1 g 200 mL/hr over 30 Minutes Intravenous Daily 08/22/21 0527     08/22/21 0600  azithromycin (ZITHROMAX) 500 mg in sodium chloride 0.9 % 250 mL IVPB  Status:  Discontinued        500 mg 250 mL/hr over 60 Minutes Intravenous Daily 08/22/21 0527 08/23/21 0806       Subjective: And examined at bedside and she was still complaining of some nausea and felt anxious and having quite a bit of diarrhea now.  Having some crampy abdominal pain.  Denies any chest pain or shortness of breath.  Feels okay.  No other concerns or complaints this time.  Objective: Vitals:   08/23/21 0834 08/23/21 1146 08/23/21 1349 08/23/21 1649  BP:   (!) 145/75  140/81  Pulse: 72 67 89 72  Resp:  15  15  Temp:  (!) 97.5 F (36.4 C)  (!) 97.5 F (36.4 C)  TempSrc:  Oral  Oral  SpO2:    99%  Weight:        Intake/Output Summary (Last 24 hours) at 08/23/2021 1833 Last data filed at 08/23/2021 1600 Gross per 24 hour  Intake 445 ml  Output --  Net 445 ml   Filed Weights   08/23/21 0421  Weight: 78.6 kg   Examination: Physical Exam:  Constitutional: WN/WD overweight Caucasian female in Respiratory: Clear to auscultation bilaterally, no wheezing, rales, rhonchi or crackles. Normal respiratory effort and patient is not tachypenic. No accessory muscle use.  Cardiovascular: RRR, no murmurs / rubs / gallops. S1 and S2 auscultated.  Abdomen: Soft, non-tender, Distended 2/2 body habitus. Bowel sounds positive.  GU: Deferred. Musculoskeletal: No clubbing / cyanosis of digits/nails. No joint deformity upper and lower extremities. Skin: No rashes, lesions, ulcers. No induration; Warm and dry.  Neurologic: CN 2-12 grossly intact with no focal deficits.  Psychiatric: Normal judgment and insight.  Alert and oriented x 3. Anxious Mood  Data Reviewed: I have personally reviewed following labs and imaging studies  CBC: Recent Labs  Lab 08/22/21 0212 08/22/21 0844 08/23/21 0213  WBC 6.1 5.1 4.8  NEUTROABS  --  1.5* 1.3*  HGB 12.7 12.5 13.0  HCT 36.9 36.0 36.0  MCV 92.5 92.1 88.5  PLT 165 157 016   Basic Metabolic Panel: Recent Labs  Lab 08/22/21 0212 08/22/21 0844 08/23/21 0213  NA 138 139 140  K 3.2* 3.6 3.4*  CL 103 104 106  CO2 '26 26 23  '$ GLUCOSE 107* 113* 100*  BUN '21 18 13  '$ CREATININE 1.21* 0.94 0.80  CALCIUM 8.8* 8.7* 9.1  MG 1.9 2.2 1.7  PHOS  --  3.8 3.4   GFR: Estimated Creatinine Clearance: 73.1 mL/min (by C-G formula based on SCr of 0.8 mg/dL). Liver Function Tests: Recent Labs  Lab 08/22/21 0212 08/22/21 0844 08/23/21 0213  AST '20 22 20  '$ ALT '17 17 18  '$ ALKPHOS 53 53 52  BILITOT 1.0 0.5 0.7  PROT 6.6  6.3* 6.6  ALBUMIN 3.7 3.5 3.6   Recent Labs  Lab 08/22/21 0844  LIPASE 28   No results for input(s): AMMONIA in the last 168 hours. Coagulation Profile: Recent Labs  Lab 08/22/21 0212  INR 1.1   Cardiac Enzymes: No results for input(s): CKTOTAL, CKMB, CKMBINDEX, TROPONINI in the last 168 hours. BNP (last 3 results) No results for input(s): PROBNP in the last 8760 hours. HbA1C: Recent Labs    08/22/21 0426  HGBA1C 6.3*   CBG: Recent Labs  Lab 08/22/21 0804 08/22/21 1147 08/22/21 1727 08/23/21 0750 08/23/21 1149  GLUCAP 96 106* 118* 116* 88   Lipid Profile: No results for input(s): CHOL, HDL, LDLCALC, TRIG, CHOLHDL, LDLDIRECT in the last 72 hours. Thyroid Function Tests: No results for input(s): TSH, T4TOTAL, FREET4, T3FREE, THYROIDAB in the last 72 hours. Anemia Panel: No results for input(s): VITAMINB12, FOLATE, FERRITIN, TIBC, IRON, RETICCTPCT in the last 72 hours. Sepsis Labs: Recent Labs  Lab 08/22/21 0438  PROCALCITON 0.10    Recent Results (from the past 240 hour(s))  Resp Panel by RT-PCR (Flu A&B, Covid) Nasopharyngeal Swab     Status: None   Collection Time: 08/22/21  2:54 AM   Specimen: Nasopharyngeal Swab; Nasopharyngeal(NP) swabs in vial transport medium  Result Value Ref Range Status   SARS Coronavirus 2 by RT PCR NEGATIVE NEGATIVE Final    Comment: (NOTE) SARS-CoV-2 target nucleic acids are NOT DETECTED.  The SARS-CoV-2 RNA is generally detectable in upper respiratory specimens during the acute phase of infection. The lowest concentration of SARS-CoV-2 viral copies this assay can detect is 138 copies/mL. A negative result does not preclude SARS-Cov-2 infection and should not be used as the sole basis for treatment or other patient management decisions. A negative result may occur with  improper specimen collection/handling, submission of specimen other than nasopharyngeal swab, presence of viral mutation(s) within the areas targeted by this  assay, and inadequate number of viral copies(<138 copies/mL). A negative result must be combined with clinical observations, patient history, and epidemiological information. The expected result is Negative.  Fact Sheet for Patients:  EntrepreneurPulse.com.au  Fact Sheet for Healthcare Providers:  IncredibleEmployment.be  This test is no t yet approved or cleared by the [place] FDA and  has been authorized for detection and/or diagnosis of SARS-CoV-2 by FDA under an Emergency Use Authorization (EUA). This EUA will remain  in effect (meaning this test can be  used) for the duration of the COVID-19 declaration under Section 564(b)(1) of the Act, 21 U.S.C.section 360bbb-3(b)(1), unless the authorization is terminated  or revoked sooner.       Influenza A by PCR NEGATIVE NEGATIVE Final   Influenza B by PCR NEGATIVE NEGATIVE Final    Comment: (NOTE) The Xpert Xpress SARS-CoV-2/FLU/RSV plus assay is intended as an aid in the diagnosis of influenza from Nasopharyngeal swab specimens and should not be used as a sole basis for treatment. Nasal washings and aspirates are unacceptable for Xpert Xpress SARS-CoV-2/FLU/RSV testing.  Fact Sheet for Patients: EntrepreneurPulse.com.au  Fact Sheet for Healthcare Providers: IncredibleEmployment.be  This test is not yet approved or cleared by the [place] FDA and has been authorized for detection and/or diagnosis of SARS-CoV-2 by FDA under an Emergency Use Authorization (EUA). This EUA will remain in effect (meaning this test can be used) for the duration of the COVID-19 declaration under Section 564(b)(1) of the Act, 21 U.S.C. section 360bbb-3(b)(1), unless the authorization is terminated or revoked.  Performed at [facility], [place] [address] [place], [place] [address]   C Difficile Quick Screen w PCR reflex     Status: None   Collection Time: 08/22/21   4:44 AM   Specimen: STOOL  Result Value Ref Range Status   C Diff antigen NEGATIVE NEGATIVE Final   C Diff toxin NEGATIVE NEGATIVE Final   C Diff interpretation No C. difficile detected.  Final    Comment: Performed at [facility], [place] [address] [place], [place] [address]  Culture, blood (Routine X 2) w Reflex to ID Panel     Status: None (Preliminary result)   Collection Time: 08/22/21  6:50 AM   Specimen: BLOOD LEFT HAND  Result Value Ref Range Status   Specimen Description BLOOD LEFT HAND  Final   Special Requests   Final    BOTTLES DRAWN AEROBIC ONLY Blood Culture results may not be optimal due to an inadequate volume of blood received in culture bottles   Culture   Final    NO GROWTH 1 DAY Performed at [facility], [place] [address], [place], [place] [address]    Report Status PENDING  Incomplete  Culture, blood (Routine X 2) w Reflex to ID Panel     Status: None (Preliminary result)   Collection Time: 08/22/21  7:01 AM   Specimen: BLOOD LEFT HAND  Result Value Ref Range Status   Specimen Description BLOOD LEFT HAND  Final   Special Requests   Final    BOTTLES DRAWN AEROBIC ONLY Blood Culture adequate volume   Culture   Final    NO GROWTH 1 DAY Performed at [facility], [place] [address] [place], [place] [address]    Report Status PENDING  Incomplete     Radiology Studies: DG Chest Portable 1 View  Result Date: 08/22/2021 CLINICAL DATA:  Chest pain EXAM: PORTABLE CHEST 1 VIEW COMPARISON:  08/21/2021 FINDINGS: Cardiac shadow is stable. Aortic calcifications are noted. The lungs are well aerated bilaterally. Patchy airspace opacity is noted particularly in the right upper lobe new from the prior exam. No sizable effusion is seen. No bony abnormality is noted. IMPRESSION: Increasing right upper lobe airspace opacity. Electronically Signed   By: Inez Catalina M.D.   On: 08/22/2021 02:36   ECHOCARDIOGRAM COMPLETE  Result Date: 08/22/2021    ECHOCARDIOGRAM  REPORT   Patient Name:   GLEN BLATCHLEY Markowicz Date of Exam: 08/22/2021 Medical  Rec #:  [id number]          Height:       68.0 in Accession #:    [id number]         Weight:       183.0 lb Date of Birth:  13-Oct-1950          BSA:          1.968 m Patient Age:    23 years           BP:           136/54 mmHg Patient Gender: F                  HR:           70 bpm. Exam Location:  Inpatient Procedure: 2D Echo, Cardiac Doppler, Color Doppler and Intracardiac            Opacification Agent Indications:    NSTEMI  History:        Patient has prior history of Echocardiogram examinations, most                 recent 01/06/2021. CHF, CAD; Risk Factors:Diabetes, Dyslipidemia                 and Hypertension.  Sonographer:    Clayton Lefort RDCS (AE) Referring Phys: [id number] Rhetta Mura  Sonographer Comments: Suboptimal subcostal window and Technically difficult study due to poor echo windows. IMPRESSIONS  1. Septal apical and inferior apical hypokinesis . Left ventricular ejection fraction, by estimation, is 40 to 45%. The left ventricle has mildly decreased function. The left ventricle demonstrates regional wall motion abnormalities (see scoring diagram/findings for description). The left ventricular internal cavity size was mildly dilated. There is mild left ventricular hypertrophy. Left ventricular diastolic parameters were normal.  2. Right ventricular systolic function is normal. The right ventricular size is normal.  3. Left atrial size was moderately dilated.  4. The mitral valve is abnormal. Mild mitral valve regurgitation. No evidence of mitral stenosis.  5. The aortic valve is tricuspid. There is moderate calcification of the aortic valve. There is moderate thickening of the aortic valve. Aortic valve regurgitation is not visualized. Mild aortic valve stenosis.  6. The inferior vena cava is normal in size with greater than 50% respiratory variability, suggesting right atrial pressure of 3 mmHg. FINDINGS  Left Ventricle:  Septal apical and inferior apical hypokinesis. Left ventricular ejection fraction, by estimation, is 40 to 45%. The left ventricle has mildly decreased function. The left ventricle demonstrates regional wall motion abnormalities. Definity contrast agent was given IV to delineate the left ventricular endocardial borders. The left ventricular internal cavity size was mildly dilated. There is mild left ventricular hypertrophy. Left ventricular diastolic parameters were normal. Right Ventricle: The right ventricular size is normal. No increase in right ventricular wall thickness. Right ventricular systolic function is normal. Left Atrium: Left atrial size was moderately dilated. Right Atrium: Right atrial size was normal in size. Pericardium: There is no evidence of pericardial effusion. Mitral Valve: The mitral valve is abnormal. There is mild thickening of the mitral valve leaflet(s). There is mild calcification of the mitral valve leaflet(s). Mild mitral valve regurgitation. No evidence of mitral valve stenosis. Tricuspid Valve: The tricuspid valve is normal in structure. Tricuspid valve regurgitation is mild . No evidence of tricuspid stenosis. Aortic Valve: The aortic valve is tricuspid. There is moderate calcification of the aortic valve. There is moderate thickening of the  aortic valve. Aortic valve regurgitation is not visualized. Mild aortic stenosis is present. Aortic valve mean gradient measures 7.0 mmHg. Aortic valve peak gradient measures 12.0 mmHg. Aortic valve area, by VTI measures 1.88 cm. Pulmonic Valve: The pulmonic valve was normal in structure. Pulmonic valve regurgitation is not visualized. No evidence of pulmonic stenosis. Aorta: The aortic root is normal in size and structure. Venous: The inferior vena cava is normal in size with greater than 50% respiratory variability, suggesting right atrial pressure of 3 mmHg. IAS/Shunts: No atrial level shunt detected by color flow Doppler.  LEFT VENTRICLE  PLAX 2D LVIDd:         4.30 cm   Diastology LVIDs:         3.50 cm   LV e' medial:    4.57 cm/s LV PW:         1.10 cm   LV E/e' medial:  20.8 LV IVS:        1.30 cm   LV e' lateral:   9.36 cm/s LVOT diam:     2.20 cm   LV E/e' lateral: 10.2 LV SV:         68 LV SV Index:   34 LVOT Area:     3.80 cm  RIGHT VENTRICLE RV Basal diam:  2.70 cm RV S prime:     13.50 cm/s TAPSE (M-mode): 2.4 cm LEFT ATRIUM             Index        RIGHT ATRIUM           Index LA diam:        4.30 cm 2.18 cm/m   RA Area:     14.90 cm LA Vol (A2C):   58.7 ml 29.83 ml/m  RA Volume:   34.30 ml  17.43 ml/m LA Vol (A4C):   52.0 ml 26.42 ml/m LA Biplane Vol: 56.0 ml 28.46 ml/m  AORTIC VALVE AV Area (Vmax):    1.80 cm AV Area (Vmean):   1.73 cm AV Area (VTI):     1.88 cm AV Vmax:           173.00 cm/s AV Vmean:          119.000 cm/s AV VTI:            0.360 m AV Peak Grad:      12.0 mmHg AV Mean Grad:      7.0 mmHg LVOT Vmax:         82.10 cm/s LVOT Vmean:        54.100 cm/s LVOT VTI:          0.178 m LVOT/AV VTI ratio: 0.49  AORTA Ao Root diam: 3.20 cm Ao Asc diam:  3.40 cm MITRAL VALVE MV Area (PHT): 4.08 cm    SHUNTS MV Decel Time: 186 msec    Systemic VTI:  0.18 m MV E velocity: 95.10 cm/s  Systemic Diam: 2.20 cm MV A velocity: 85.90 cm/s MV E/A ratio:  1.11 Jenkins Rouge MD Electronically signed by Jenkins Rouge MD Signature Date/Time: 08/22/2021/5:33:38 PM    Final      Scheduled Meds:  aspirin EC  81 mg Oral Daily   azithromycin  500 mg Oral Daily   ezetimibe  10 mg Oral Daily   gabapentin  600 mg Oral TID   insulin aspart  0-6 Units Subcutaneous TID WC   metoprolol succinate  12.5 mg Oral Daily   potassium chloride SA  20 mEq  Oral Daily   QUEtiapine  25 mg Oral QHS   Continuous Infusions:  cefTRIAXone (ROCEPHIN)  IV 1 g (08/23/21 0900)     LOS: 0 days   Raiford Noble, DO [facility] Available via Epic secure chat 7am-7pm After these hours, please refer to coverage provider listed on  [email or web address] 08/23/2021, 6:33 PM

## 2021-08-23 NOTE — Progress Notes (Signed)
PHARMACIST - PHYSICIAN COMMUNICATION  CONCERNING: Antibiotic IV to Oral Route Change Policy  RECOMMENDATION: This patient is receiving Azithromycin by the intravenous route.  Based on criteria approved by the Pharmacy and Therapeutics Committee, the antibiotic(s) is/are being converted to the equivalent oral dose form(s).   DESCRIPTION: These criteria include: Patient being treated for a respiratory tract infection, urinary tract infection, cellulitis or clostridium difficile associated diarrhea if on metronidazole The patient is not neutropenic and does not exhibit a GI malabsorption state The patient is eating (either orally or via tube) and/or has been taking other orally administered medications for a least 24 hours The patient is improving clinically and has a Tmax < 100.5  If you have questions about this conversion, please contact the Pharmacy Department  '[]'$   [phone] )  [facility] '[]'$   [phone] )  [facility] '[x]'$   [phone] )  [name] '[]'$   [phone] )  [facility] '[]'$   [phone] )  [place], PharmD Clinical Pharmacist **Pharmacist phone directory can now be found on [email or web address] ([email or web address]).  Listed under [place].

## 2021-08-23 NOTE — Telephone Encounter (Signed)
Oral swab drug screen was consistent for prescribed medications.  ?

## 2021-08-23 NOTE — Progress Notes (Signed)
Pt severely anxious throughout second half of shift. Melatonin and ativan offered approximately 0115 but patient refused, claiming she only takes "1 mg" of melatonin at home, and did not provide reason for refusing ativan.  Pt stated she needs her gabapentin due to discomfort in her leg.  Gabapentin 300 mg order obtained.  Pt upset it is not her standard 600 mg qid order but took dose anyway.  Pt called out again at 0530, severely agitated that she had not been able to sleep and upset at the nurse for not giving her more gabapentin.  Nurse reminded patient that two different medications were offered earlier for sleep and anxiety, both of which were refused.  Nurse also urged pt to have conversation with MD regarding medication dosing, and educating that nurses are not able to prescribe medications.  Will inform oncoming nurse of this situation.

## 2021-08-23 NOTE — Progress Notes (Incomplete)
Heart Failure Stewardship Pharmacist Progress Note   PCP: [place], Betty G, MD PCP-Cardiologist: Dorris Carnes, MD    HPI:  ***  Current HF Medications: {HF MEDS OEUMPNT:61443}  Prior to admission HF Medications: {HF MEDS PTA:26664}  Pertinent Lab Values: Serum creatinine ***, BUN ***, Potassium ***, Sodium ***, BNP ***, Magnesium ***, A1c ***, Digoxin ***   Vital Signs: Weight: *** lbs (admission weight: *** lbs) Blood pressure: ***  Heart rate: ***  I/O: -***L yesterday; net -***L  Medication Assistance / Insurance Benefits Check: Does the patient have prescription insurance?  {Yes/No/Pending:24180} Type of insurance plan: ***  Does the patient qualify for medication assistance through manufacturers or grants?   {Yes/No/Pending:24180} Eligible grants and/or patient assistance programs: *** Medication assistance applications in progress: ***  Medication assistance applications approved: *** Approved medication assistance renewals will be completed by: ***  Outpatient Pharmacy:  Prior to admission outpatient pharmacy: *** Is the patient willing to use [facility] at discharge? {Yes/No/Pending:24180} Is the patient willing to transition their outpatient pharmacy to utilize a [facility] outpatient pharmacy?   {Yes/No/Pending:24180}    Assessment: 1. Acute on ***chronic ***systolic CHF (LVEF ***%), due to ***. NYHA class *** symptoms. -    Plan: 1) Medication changes recommended at this time:  -  2) Patient assistance: -  3)  Education  - To be completed prior to discharge  Kerby Nora, PharmD, BCPS Heart Failure Stewardship Pharmacist Phone [phone]

## 2021-08-24 DIAGNOSIS — I2510 Atherosclerotic heart disease of native coronary artery without angina pectoris: Secondary | ICD-10-CM | POA: Diagnosis not present

## 2021-08-24 DIAGNOSIS — I5022 Chronic systolic (congestive) heart failure: Secondary | ICD-10-CM | POA: Diagnosis not present

## 2021-08-24 DIAGNOSIS — A084 Viral intestinal infection, unspecified: Secondary | ICD-10-CM | POA: Diagnosis not present

## 2021-08-24 DIAGNOSIS — Z885 Allergy status to narcotic agent status: Secondary | ICD-10-CM | POA: Diagnosis not present

## 2021-08-24 DIAGNOSIS — I110 Hypertensive heart disease with heart failure: Secondary | ICD-10-CM | POA: Diagnosis not present

## 2021-08-24 DIAGNOSIS — J189 Pneumonia, unspecified organism: Secondary | ICD-10-CM | POA: Diagnosis not present

## 2021-08-24 DIAGNOSIS — F419 Anxiety disorder, unspecified: Secondary | ICD-10-CM | POA: Diagnosis not present

## 2021-08-24 DIAGNOSIS — I10 Essential (primary) hypertension: Secondary | ICD-10-CM | POA: Diagnosis not present

## 2021-08-24 DIAGNOSIS — I214 Non-ST elevation (NSTEMI) myocardial infarction: Secondary | ICD-10-CM | POA: Diagnosis not present

## 2021-08-24 DIAGNOSIS — F411 Generalized anxiety disorder: Secondary | ICD-10-CM | POA: Diagnosis not present

## 2021-08-24 DIAGNOSIS — E876 Hypokalemia: Secondary | ICD-10-CM | POA: Diagnosis not present

## 2021-08-24 DIAGNOSIS — Z20822 Contact with and (suspected) exposure to covid-19: Secondary | ICD-10-CM | POA: Diagnosis not present

## 2021-08-24 DIAGNOSIS — N179 Acute kidney failure, unspecified: Secondary | ICD-10-CM | POA: Diagnosis not present

## 2021-08-24 DIAGNOSIS — E7800 Pure hypercholesterolemia, unspecified: Secondary | ICD-10-CM | POA: Diagnosis not present

## 2021-08-24 DIAGNOSIS — E871 Hypo-osmolality and hyponatremia: Secondary | ICD-10-CM | POA: Diagnosis not present

## 2021-08-24 DIAGNOSIS — E1149 Type 2 diabetes mellitus with other diabetic neurological complication: Secondary | ICD-10-CM | POA: Diagnosis not present

## 2021-08-24 DIAGNOSIS — I248 Other forms of acute ischemic heart disease: Secondary | ICD-10-CM | POA: Diagnosis not present

## 2021-08-24 DIAGNOSIS — E860 Dehydration: Secondary | ICD-10-CM | POA: Diagnosis not present

## 2021-08-24 DIAGNOSIS — R109 Unspecified abdominal pain: Secondary | ICD-10-CM | POA: Diagnosis not present

## 2021-08-24 DIAGNOSIS — K219 Gastro-esophageal reflux disease without esophagitis: Secondary | ICD-10-CM | POA: Diagnosis not present

## 2021-08-24 LAB — CBC WITH DIFFERENTIAL/PLATELET
Abs Immature Granulocytes: 0.01 10*3/uL (ref 0.00–0.07)
Basophils Absolute: 0 10*3/uL (ref 0.0–0.1)
Basophils Relative: 1 %
Eosinophils Absolute: 0.1 10*3/uL (ref 0.0–0.5)
Eosinophils Relative: 1 %
HCT: 35.8 % — ABNORMAL LOW (ref 36.0–46.0)
Hemoglobin: 12.5 g/dL (ref 12.0–15.0)
Immature Granulocytes: 0 %
Lymphocytes Relative: 45 %
Lymphs Abs: 2 10*3/uL (ref 0.7–4.0)
MCH: 31.4 pg (ref 26.0–34.0)
MCHC: 34.9 g/dL (ref 30.0–36.0)
MCV: 89.9 fL (ref 80.0–100.0)
Monocytes Absolute: 1.1 10*3/uL — ABNORMAL HIGH (ref 0.1–1.0)
Monocytes Relative: 24 %
Neutro Abs: 1.3 10*3/uL — ABNORMAL LOW (ref 1.7–7.7)
Neutrophils Relative %: 29 %
Platelets: 206 10*3/uL (ref 150–400)
RBC: 3.98 MIL/uL (ref 3.87–5.11)
RDW: 11.9 % (ref 11.5–15.5)
WBC: 4.5 10*3/uL (ref 4.0–10.5)
nRBC: 0 % (ref 0.0–0.2)

## 2021-08-24 LAB — COMPREHENSIVE METABOLIC PANEL
ALT: 18 U/L (ref 0–44)
AST: 21 U/L (ref 15–41)
Albumin: 3.5 g/dL (ref 3.5–5.0)
Alkaline Phosphatase: 54 U/L (ref 38–126)
Anion gap: 9 (ref 5–15)
BUN: 10 mg/dL (ref 8–23)
CO2: 24 mmol/L (ref 22–32)
Calcium: 9.1 mg/dL (ref 8.9–10.3)
Chloride: 107 mmol/L (ref 98–111)
Creatinine, Ser: 0.75 mg/dL (ref 0.44–1.00)
GFR, Estimated: >60 mL/min (ref >60)
Glucose, Bld: 85 mg/dL (ref 70–99)
Potassium: 3.8 mmol/L (ref 3.5–5.1)
Sodium: 140 mmol/L (ref 135–145)
Total Bilirubin: 0.9 mg/dL (ref 0.3–1.2)
Total Protein: 6.5 g/dL (ref 6.5–8.1)

## 2021-08-24 LAB — GLUCOSE, CAPILLARY
Glucose-Capillary: 103 mg/dL — ABNORMAL HIGH (ref 70–99)
Glucose-Capillary: 98 mg/dL (ref 70–99)

## 2021-08-24 LAB — PHOSPHORUS: Phosphorus: 4.2 mg/dL (ref 2.5–4.6)

## 2021-08-24 LAB — LIPID PANEL
Cholesterol: 187 mg/dL (ref 0–200)
HDL: 27 mg/dL — ABNORMAL LOW (ref >40)
LDL Cholesterol: 115 mg/dL — ABNORMAL HIGH (ref 0–99)
Total CHOL/HDL Ratio: 6.9 RATIO
Triglycerides: 223 mg/dL — ABNORMAL HIGH (ref <150)
VLDL: 45 mg/dL — ABNORMAL HIGH (ref 0–40)

## 2021-08-24 LAB — MAGNESIUM: Magnesium: 1.9 mg/dL (ref 1.7–2.4)

## 2021-08-24 MED ORDER — CEFDINIR 300 MG PO CAPS: 300.0000 mg | ORAL_CAPSULE | Freq: Two times a day (BID) | ORAL | 0 refills | Status: DC

## 2021-08-24 MED ORDER — AZITHROMYCIN 500 MG PO TABS: 500.0000 mg | ORAL_TABLET | Freq: Every day | ORAL | 0 refills | Status: AC

## 2021-08-24 NOTE — Discharge Summary (Signed)
Physician Discharge Summary   Patient: [name] MRN: [id number] DOB: [birth year]  Admit date:     08/22/2021  Discharge date: 08/24/21  Discharge Physician: Raiford Noble, DO   PCP: [place], Betty G, MD   Recommendations at discharge:   Follow up PCP within 1-2 weeks and repeat CBC,CMP,Mag, Phos within 1 week Follow up with Cardiology within 1-2 weeks  Follow up with Cardiothoracic Surgery within 1-2 weeks Repeat chest x-ray in 3 to 6 weeks  Discharge Diagnoses: Principal Problem:   NSTEMI (non-ST elevated myocardial infarction) ([place]) Active Problems:   Type 2 diabetes mellitus with neurological complications (HCC)   HYPERCHOLESTEROLEMIA   HYPOKALEMIA   GERD   Essential hypertension   Anxiety   Nausea vomiting and diarrhea   AKI (acute kidney injury) ([name])   CAP (community acquired pneumonia)   Chronic systolic CHF (congestive heart failure) (HCC)   Pneumonia   Abdominal cramping  Resolved Problems:   * No resolved hospital problems. [facility] Course: The patient is a 71 year old female with a past medical history significant for but not limited to CAD, generalized anxiety disorder, diabetes mellitus type 2, hypertension, hyperlipidemia, chronic systolic CHF as well as other comorbidities who presented with chief complaint of nausea, vomiting and diarrhea.  Incidentally at [name] patient noted to have elevated troponin and so she was transferred to [facility] for concern for an NSTEMI.  She reports 4 days of nausea vomiting and diarrhea and states that nausea is been intermittent in nature but she had 2-3 daily episodes of nonbloody nonbilious emesis with recent occurring episode just prior to presenting to the ED department at [facility].  She also states that she has had 3-4 daily bowel movements of watery but dark color black tarry stool and denies any abdominal pain.   She did have some chest discomfort but after she was placed on antibiotics for chest  discomfort has improved and she is able to breathe easily without any issues.  She was transferred to [name] given her history of CAD and most recent coronary angiography in December 2022 revealing multivessel CAD was not amenable to PCI.  Currently undergoing medical management has been on daily aspirin and metoprolol succinate.  She follows with [name] of [name] cardiology and most recent echocardiogram showed an EF of 40 to 45%.  At [facility] she had a notable elevated troponin of 786 but here is only elevated 86 and then trended down to 88.  Echocardiogram is being repeated today but cardiology here evaluated and felt that she had a minimal elevation with a flat trend and she denies any current chest clear now.  They felt that her elevated troponin is not consistent with ACS and likely demand ischemia in the setting of dehydration given her vomiting and diarrhea as well as AKI and acute infection.  Her heparin drip has now been stopped.  Cardiology is following closely and they feel the patient is euvolemic now and recommending holding her diuretics given her AKI.  Cardiology feels that she would ideally be on GDMT more than just metoprolol but negative consider starting ARB and ARNI this admission if EF is still reduced and AKI improved.   Given her black stools we have notified GI for further evaluation recommendations.  GI evaluated and they feel that her clinical presentation is most consistent with infectious gastroenteritis and they are unsure if she truly had melena versus just dark stools but hemoglobin/hematocrit is stable.  Now she is  having significant amount of diarrhea still having crampy abdominal pain so we will get a CT scan of the abdomen pelvis.  GI recommends conservative management at this point and continue to advance diet as tolerated and recommending Bentyl since abdominal cramping is returned  CT Scan showed underdistention vs mild colitis. She and her GI symptoms are  improving and she is stable for discharge PCP as well as cardiology and cardiothoracic surgery she will be transition to 5 days of antibiotics to completion for her pneumonia and will need a chest x-ray in 3 to 6 weeks.  Assessment and Plan:  Elevated Troponin CAD -Elevated on admission was 86 and then trended around 80 -Denies any chest pain now and cardiology was consulted and feel that this is not consistent with ACS and felt that this is demand ischemia in the setting of her dehydration from vomiting and diarrhea as well as AKI and acute community-acquired pneumonia -Echo done and showed a mildly diminished EF from last time and is now 40 to 45% with her most recent echo being 45 to 45% -Continue to monitor volume status and cardiology recommending no further work-up for her elevated troponin -Patient had a cardiac cath in December 2022 and she has been evaluated by cardiovascular surgery and she has a follow-up appointment with [name] in June to discuss medical versus surgical management of her CAD -Currently denies any chest pain and cardiology recommends continuing aspirin and metoprolol   Dark Stools with concern for GIB -GI evaluated and feels that she since her hemoglobin is stable we will pursue conservative measures and they recommend outpatient colonoscopy   Nausea, Vomiting, and Diarrhea -Was improving but now having significant amount of diarrhea again and symptoms are related to viral gastroenteritis -Continue with Zofran and Compazine -Check CT scan of the abdomen pelvis and showed under distention versus mild colitis.  She is improved and diarrhea is more formed.  She is closer to her baseline and she is medically stable for discharge now no follow-up with PCP   Hypokalemia -Mild at 3.4 and improved to 3.8 -Replete with po KCL 40 mEQ BID x2 -Continue to Monitor and Replete as Necessary -Repeat CMP in the AM    AKI, improving  -Patient's BUN/Cr went from 21/1.21  -> 18/0.93 -> 13/0.90 and is now 10/0.7 by the time of the -Avoid further nephrotoxic medications, contrast dyes, hypotension and dehydration to ensure adequate renal perfusion and renally adjust medications -IV fluid hydration was given -Continue to monitor and trend and repeat CMP within 1 week   CAP -Zithromax and ceftriaxone changed to p.o. azithromycin and p.o. cefdinir -Repeat chest x-ray as an outpatient -Flutter valve, incentive spirometer and will add guaifenesin 1200 mg p.o. twice daily -Follow-up in outpatient setting and repeat chest x-ray in 3 to 6 weeks   GAD -C/w Clonazepam 0.5 mg po BIDprn Anxiety    Type 2 Diabetes Mellitus -CBG's ranging from 88-124 -C/w Very Sensitive Novolog SSI  -Continue gabapentin 600 mg p.o. 3 times daily   GERD/GI Prophylaxis -C/w PPI    Essential HTN -Continue with metoprolol -Continue to Monitor BP per Protocol and follow-up in outpatient setting   HLD -She is statin intolerant and the cardiology team is recommending continuing Zetia and following up in outpatient lipid clinic -Lipid panel done showed a total cholesterol/HDL ratio 6.9, cholesterol 187, HDL 27, LDL 115, triglycerides of 223, VLDL 45   Chronic Systolic CHF -BNP was elevated at 172 -Cardiology is following and had a  recent cath and 03/08/2021 -Cardiology recommended checking echo and it showed a repeat EF of 40 to 45% -Continue to monitor for signs and symptoms of volume overload and; patient currently euvolemic to little dry, now she is +1.286 L -Continue metoprolol -Cardiology is recommending ARB/ARNI and other GDMT in the outpatient setting when she is over acute illness as they are hesitant to start new medications while she has active nausea vomiting diarrhea no pneumonia    Consultants: Cardiology Procedures performed: Echocardiogram Disposition: Home Diet recommendation:  Discharge Diet Orders (From admission, onward)     Start     Ordered   08/24/21 0000   Diet - low sodium heart healthy        08/24/21 1206   08/24/21 0000  Diet Carb Modified        08/24/21 1206           Cardiac and Carb modified diet DISCHARGE MEDICATION: Allergies as of 08/24/2021       Reactions   Hydromorphone Nausea And Vomiting, Other (See Comments)   Made the patient feel "spaced out,"    Morphine And Related Itching   With high doses   Other Itching   Penicillins Rash   Has patient had a PCN reaction causing immediate rash, facial/tongue/throat swelling, SOB or lightheadedness with hypotension: Yes Has patient had a PCN reaction causing severe rash involving mucus membranes or skin necrosis: No Has patient had a PCN reaction that required hospitalization: No Has patient had a PCN reaction occurring within the last 10 years: No If all of the above answers are "NO", then may proceed with Cephalosporin use.        Medication List     STOP taking these medications    ipratropium 0.06 % nasal spray Commonly known as: ATROVENT       TAKE these medications    Accu-Chek Aviva Plus w/Device Kit Use to check blood sugar 3 times daily   Accu-Chek Aviva Soln Use as directed   Accu-Chek Softclix Lancets lancets Use to test blood sugar 3 times daily. Dx: E11.8   albuterol 108 (90 Base) MCG/ACT inhaler Commonly known as: VENTOLIN HFA INHALE 2 PUFFS INTO THE LUNGS EVERY 6 HOURS AS NEEDED FOR WHEEZING OR SHORTNESS OF BREATH   aspirin EC 81 MG tablet Take 81 mg by mouth daily. Swallow whole.   azithromycin 500 MG tablet Commonly known as: ZITHROMAX Take 1 tablet (500 mg total) by mouth daily for 3 days.   B-D SINGLE USE SWABS REGULAR Pads Use to test blood sugar 3 times daily   cefdinir 300 MG capsule Commonly known as: OMNICEF Take 1 capsule (300 mg total) by mouth 2 (two) times daily.   clonazePAM 1 MG tablet Commonly known as: KLONOPIN TAKE 1 TABLET(1 MG) BY MOUTH TWICE DAILY AS NEEDED FOR ANXIETY   ezetimibe 10 MG tablet Commonly  known as: Zetia Take 1 tablet (10 mg total) by mouth daily.   fluticasone 50 MCG/ACT nasal spray Commonly known as: FLONASE Place 2 sprays into both nostrils daily as needed for allergies or rhinitis.   gabapentin 600 MG tablet Commonly known as: NEURONTIN Take 1 tablet (600 mg total) by mouth in the morning, at noon, in the evening, and at bedtime.   glucose blood test strip Commonly known as: Accu-Chek Aviva Use to test blood sugar 3 times daily. Dx: 11.8   hydrochlorothiazide 25 MG tablet Commonly known as: HYDRODIURIL TAKE 1 TABLET BY MOUTH EVERY DAY   HYDROcodone-acetaminophen 7.5-325  MG tablet Commonly known as: NORCO Take 1 tablet by mouth every 6 (six) hours as needed for moderate pain.   Magnesium 250 MG Tabs Take 250 mg by mouth daily.   metoprolol succinate 25 MG 24 hr tablet Commonly known as: Toprol XL Take 0.5 tablets (12.5 mg total) by mouth at bedtime.   omeprazole 40 MG capsule Commonly known as: PRILOSEC Take 1 capsule (40 mg total) by mouth daily.   ondansetron 4 MG disintegrating tablet Commonly known as: ZOFRAN-ODT Take 4 mg by mouth daily as needed for nausea/vomiting.   potassium chloride SA 20 MEQ tablet Commonly known as: KLOR-CON M TAKE 1 TABLET BY MOUTH EVERY DAY   QUEtiapine 100 MG tablet Commonly known as: SEROQUEL Take 0.5 tablets (50 mg total) by mouth at bedtime.   triamcinolone 0.1 % paste Commonly known as: KENALOG Use as directed 1 application. in the mouth or throat 2 (two) times daily.   Trulicity 1.5 ZE/0.9QZ Sopn Generic drug: Dulaglutide Inject 1.5 mg into the skin once a week.        Follow-up Information     [facility]. Go in 22 day(s).   Specialty: Cardiology Why: Hospital follow up Diet/fluid restrictions ( soda) Daily weights Contact information: [address] [id number] [place]               Discharge  Exam: Danley Danker Weights   08/23/21 0421 08/24/21 0504  Weight: 78.6 kg 79.1 kg   Vitals:   08/24/21 0837 08/24/21 1306  BP:  122/84  Pulse: 84 96  Resp:  18  Temp:  98.8 F (37.1 C)  SpO2:  98%   Examination: Physical Exam:  Constitutional: WN/WD overweight Caucasian female currently no acute distress Respiratory: Diminished to auscultation bilaterally with coarse breath sounds slight rhonchi on the right side, no wheezing, rales, or crackles. Normal respiratory effort and patient is not tachypenic. No accessory muscle use.  Unlabored breathing Cardiovascular: RRR, no murmurs / rubs / gallops. S1 and S2 auscultated.  Abdomen: Soft, slightly-tender, mildly distended second body mass. Bowel sounds positive.  GU: Deferred. Musculoskeletal: No clubbing / cyanosis of digits/nails. No joint deformity upper and lower extremities.  Neurologic: CN 2-12 grossly intact with no focal deficits.  Romberg sign and cerebellar reflexes not assessed.  Psychiatric: Normal judgment and insight. Alert and oriented x 3. Normal mood and appropriate affect.   Condition at discharge: stable  The results of significant diagnostics from this hospitalization (including imaging, microbiology, ancillary and laboratory) are listed below for reference.   Imaging Studies: CT ABDOMEN PELVIS W CONTRAST  Result Date: 08/23/2021 CLINICAL DATA:  Abdominal pain, nausea vomiting. EXAM: CT ABDOMEN AND PELVIS WITH CONTRAST TECHNIQUE: Multidetector CT imaging of the abdomen and pelvis was performed using the standard protocol following bolus administration of intravenous contrast. RADIATION DOSE REDUCTION: This exam was performed according to the departmental dose-optimization program which includes automated exposure control, adjustment of the mA and/or kV according to patient size and/or use of iterative reconstruction technique. CONTRAST:  173m OMNIPAQUE IOHEXOL 300 MG/ML  SOLN COMPARISON:  CT abdomen pelvis dated 12/04/2016.  FINDINGS: Lower chest: The visualized lung bases are clear. No intra-abdominal free air or free fluid. Hepatobiliary: The liver is unremarkable. No intrahepatic biliary dilatation. Cholecystectomy. No retained calcified stone noted in the central CBD. Pancreas: Unremarkable. No pancreatic ductal dilatation or surrounding inflammatory changes. Spleen: Normal in size without focal abnormality. Adrenals/Urinary Tract: Similar appearance of peripherally  calcified left adrenal lesions, possibly sequela of prior insult or hemorrhage. The right adrenal gland is unremarkable. There is no hydronephrosis on either side. There is symmetric enhancement and excretion of contrast by both kidneys. The visualized ureters and urinary bladder appear unremarkable. Stomach/Bowel: Mild thickened appearance of the colon may be related to underdistention. Clinical correlation is recommended to evaluate for colitis. There is no bowel obstruction. Appendectomy. Vascular/Lymphatic: Moderate aortoiliac atherosclerotic disease. The IVC is unremarkable. No portal venous gas. There is no adenopathy. Reproductive: Hysterectomy.  No adnexal masses. Other: Anterior abdominal wall surgical staples. Musculoskeletal: Degenerative changes of the spine. L2-L3 disc spacer and posterior fusion. No acute osseous pathology. IMPRESSION: 1. Underdistention of the colon versus mild colitis. No bowel obstruction. 2. Aortic Atherosclerosis (ICD10-I70.0). Electronically Signed   By: Anner Crete M.D.   On: 08/23/2021 19:36   DG Chest Portable 1 View  Result Date: 08/22/2021 CLINICAL DATA:  Chest pain EXAM: PORTABLE CHEST 1 VIEW COMPARISON:  08/21/2021 FINDINGS: Cardiac shadow is stable. Aortic calcifications are noted. The lungs are well aerated bilaterally. Patchy airspace opacity is noted particularly in the right upper lobe new from the prior exam. No sizable effusion is seen. No bony abnormality is noted. IMPRESSION: Increasing right upper lobe  airspace opacity. Electronically Signed   By: Inez Catalina M.D.   On: 08/22/2021 02:36   ECHOCARDIOGRAM COMPLETE  Result Date: 08/22/2021    ECHOCARDIOGRAM REPORT   Patient Name:   BLIMY NAPOLEON [facility] Date of Exam: 08/22/2021 Medical Rec #:  [id number]          Height:       68.0 in Accession #:    [id number]         Weight:       183.0 lb Date of Birth:  [birth year]          BSA:          1.968 m Patient Age:    68 years           BP:           136/54 mmHg Patient Gender: F                  HR:           70 bpm. Exam Location:  Inpatient Procedure: 2D Echo, Cardiac Doppler, Color Doppler and Intracardiac            Opacification Agent Indications:    NSTEMI  History:        Patient has prior history of Echocardiogram examinations, most                 recent 01/06/2021. CHF, CAD; Risk Factors:Diabetes, Dyslipidemia                 and Hypertension.  Sonographer:    Clayton Lefort RDCS (AE) Referring Phys: [id number] Rhetta Mura  Sonographer Comments: Suboptimal subcostal window and Technically difficult study due to poor echo windows. IMPRESSIONS  1. Septal apical and inferior apical hypokinesis . Left ventricular ejection fraction, by estimation, is 40 to 45%. The left ventricle has mildly decreased function. The left ventricle demonstrates regional wall motion abnormalities (see scoring diagram/findings for description). The left ventricular internal cavity size was mildly dilated. There is mild left ventricular hypertrophy. Left ventricular diastolic parameters were normal.  2. Right ventricular systolic function is normal. The right ventricular size is normal.  3. Left atrial size was moderately dilated.  4. The mitral valve is  abnormal. Mild mitral valve regurgitation. No evidence of mitral stenosis.  5. The aortic valve is tricuspid. There is moderate calcification of the aortic valve. There is moderate thickening of the aortic valve. Aortic valve regurgitation is not visualized. Mild aortic valve stenosis.  6.  The inferior vena cava is normal in size with greater than 50% respiratory variability, suggesting right atrial pressure of 3 mmHg. FINDINGS  Left Ventricle: Septal apical and inferior apical hypokinesis. Left ventricular ejection fraction, by estimation, is 40 to 45%. The left ventricle has mildly decreased function. The left ventricle demonstrates regional wall motion abnormalities. Definity contrast agent was given IV to delineate the left ventricular endocardial borders. The left ventricular internal cavity size was mildly dilated. There is mild left ventricular hypertrophy. Left ventricular diastolic parameters were normal. Right Ventricle: The right ventricular size is normal. No increase in right ventricular wall thickness. Right ventricular systolic function is normal. Left Atrium: Left atrial size was moderately dilated. Right Atrium: Right atrial size was normal in size. Pericardium: There is no evidence of pericardial effusion. Mitral Valve: The mitral valve is abnormal. There is mild thickening of the mitral valve leaflet(s). There is mild calcification of the mitral valve leaflet(s). Mild mitral valve regurgitation. No evidence of mitral valve stenosis. Tricuspid Valve: The tricuspid valve is normal in structure. Tricuspid valve regurgitation is mild . No evidence of tricuspid stenosis. Aortic Valve: The aortic valve is tricuspid. There is moderate calcification of the aortic valve. There is moderate thickening of the aortic valve. Aortic valve regurgitation is not visualized. Mild aortic stenosis is present. Aortic valve mean gradient measures 7.0 mmHg. Aortic valve peak gradient measures 12.0 mmHg. Aortic valve area, by VTI measures 1.88 cm. Pulmonic Valve: The pulmonic valve was normal in structure. Pulmonic valve regurgitation is not visualized. No evidence of pulmonic stenosis. Aorta: The aortic root is normal in size and structure. Venous: The inferior vena cava is normal in size with greater  than 50% respiratory variability, suggesting right atrial pressure of 3 mmHg. IAS/Shunts: No atrial level shunt detected by color flow Doppler.  LEFT VENTRICLE PLAX 2D LVIDd:         4.30 cm   Diastology LVIDs:         3.50 cm   LV e' medial:    4.57 cm/s LV PW:         1.10 cm   LV E/e' medial:  20.8 LV IVS:        1.30 cm   LV e' lateral:   9.36 cm/s LVOT diam:     2.20 cm   LV E/e' lateral: 10.2 LV SV:         68 LV SV Index:   34 LVOT Area:     3.80 cm  RIGHT VENTRICLE RV Basal diam:  2.70 cm RV S prime:     13.50 cm/s TAPSE (M-mode): 2.4 cm LEFT ATRIUM             Index        RIGHT ATRIUM           Index LA diam:        4.30 cm 2.18 cm/m   RA Area:     14.90 cm LA Vol (A2C):   58.7 ml 29.83 ml/m  RA Volume:   34.30 ml  17.43 ml/m LA Vol (A4C):   52.0 ml 26.42 ml/m LA Biplane Vol: 56.0 ml 28.46 ml/m  AORTIC VALVE AV Area (Vmax):    1.80 cm  AV Area (Vmean):   1.73 cm AV Area (VTI):     1.88 cm AV Vmax:           173.00 cm/s AV Vmean:          119.000 cm/s AV VTI:            0.360 m AV Peak Grad:      12.0 mmHg AV Mean Grad:      7.0 mmHg LVOT Vmax:         82.10 cm/s LVOT Vmean:        54.100 cm/s LVOT VTI:          0.178 m LVOT/AV VTI ratio: 0.49  AORTA Ao Root diam: 3.20 cm Ao Asc diam:  3.40 cm MITRAL VALVE MV Area (PHT): 4.08 cm    SHUNTS MV Decel Time: 186 msec    Systemic VTI:  0.18 m MV E velocity: 95.10 cm/s  Systemic Diam: 2.20 cm MV A velocity: 85.90 cm/s MV E/A ratio:  1.11 Jenkins Rouge MD Electronically signed by Jenkins Rouge MD Signature Date/Time: 08/22/2021/5:33:38 PM    Final     Microbiology: Results for orders placed or performed during the hospital encounter of 08/22/21  Resp Panel by RT-PCR (Flu A&B, Covid) Nasopharyngeal Swab     Status: None   Collection Time: 08/22/21  2:54 AM   Specimen: Nasopharyngeal Swab; Nasopharyngeal(NP) swabs in vial transport medium  Result Value Ref Range Status   SARS Coronavirus 2 by RT PCR NEGATIVE NEGATIVE Final    Comment:  (NOTE) SARS-CoV-2 target nucleic acids are NOT DETECTED.  The SARS-CoV-2 RNA is generally detectable in upper respiratory specimens during the acute phase of infection. The lowest concentration of SARS-CoV-2 viral copies this assay can detect is 138 copies/mL. A negative result does not preclude SARS-Cov-2 infection and should not be used as the sole basis for treatment or other patient management decisions. A negative result may occur with  improper specimen collection/handling, submission of specimen other than nasopharyngeal swab, presence of viral mutation(s) within the areas targeted by this assay, and inadequate number of viral copies(<138 copies/mL). A negative result must be combined with clinical observations, patient history, and epidemiological information. The expected result is Negative.  Fact Sheet for Patients:  EntrepreneurPulse.com.au  Fact Sheet for Healthcare Providers:  IncredibleEmployment.be  This test is no t yet approved or cleared by the [place] FDA and  has been authorized for detection and/or diagnosis of SARS-CoV-2 by FDA under an Emergency Use Authorization (EUA). This EUA will remain  in effect (meaning this test can be used) for the duration of the COVID-19 declaration under Section 564(b)(1) of the Act, 21 U.S.C.section 360bbb-3(b)(1), unless the authorization is terminated  or revoked sooner.       Influenza A by PCR NEGATIVE NEGATIVE Final   Influenza B by PCR NEGATIVE NEGATIVE Final    Comment: (NOTE) The Xpert Xpress SARS-CoV-2/FLU/RSV plus assay is intended as an aid in the diagnosis of influenza from Nasopharyngeal swab specimens and should not be used as a sole basis for treatment. Nasal washings and aspirates are unacceptable for Xpert Xpress SARS-CoV-2/FLU/RSV testing.  Fact Sheet for Patients: EntrepreneurPulse.com.au  Fact Sheet for Healthcare  Providers: IncredibleEmployment.be  This test is not yet approved or cleared by the [place] FDA and has been authorized for detection and/or diagnosis of SARS-CoV-2 by FDA under an Emergency Use Authorization (EUA). This EUA will remain in effect (meaning this test can be used) for the duration  of the COVID-19 declaration under Section 564(b)(1) of the Act, 21 U.S.C. section 360bbb-3(b)(1), unless the authorization is terminated or revoked.  Performed at [facility], [place] [address] [place], [place] [address]   C Difficile Quick Screen w PCR reflex     Status: None   Collection Time: 08/22/21  4:44 AM   Specimen: STOOL  Result Value Ref Range Status   C Diff antigen NEGATIVE NEGATIVE Final   C Diff toxin NEGATIVE NEGATIVE Final   C Diff interpretation No C. difficile detected.  Final    Comment: Performed at [facility], [place] [address] [place], [place] [id number]  Culture, blood (Routine X 2) w Reflex to ID Panel     Status: None (Preliminary result)   Collection Time: 08/22/21  6:50 AM   Specimen: BLOOD LEFT HAND  Result Value Ref Range Status   Specimen Description BLOOD LEFT HAND  Final   Special Requests   Final    BOTTLES DRAWN AEROBIC ONLY Blood Culture results may not be optimal due to an inadequate volume of blood received in culture bottles   Culture   Final    NO GROWTH 2 DAYS Performed at [facility], [place] [address], [place], [place] [address]    Report Status PENDING  Incomplete  Culture, blood (Routine X 2) w Reflex to ID Panel     Status: None (Preliminary result)   Collection Time: 08/22/21  7:01 AM   Specimen: BLOOD LEFT HAND  Result Value Ref Range Status   Specimen Description BLOOD LEFT HAND  Final   Special Requests   Final    BOTTLES DRAWN AEROBIC ONLY Blood Culture adequate volume   Culture   Final    NO GROWTH 2 DAYS Performed at [facility], [place] [address] [place], [place] [address]    Report  Status PENDING  Incomplete   Labs: CBC: Recent Labs  Lab 08/22/21 0212 08/22/21 0844 08/23/21 0213 08/24/21 0205  WBC 6.1 5.1 4.8 4.5  NEUTROABS  --  1.5* 1.3* 1.3*  HGB 12.7 12.5 13.0 12.5  HCT 36.9 36.0 36.0 35.8*  MCV 92.5 92.1 88.5 89.9  PLT 165 157 188 622   Basic Metabolic Panel: Recent Labs  Lab 08/22/21 0212 08/22/21 0844 08/23/21 0213 08/24/21 0205  NA 138 139 140 140  K 3.2* 3.6 3.4* 3.8  CL 103 104 106 107  CO2 26 26 23 24   GLUCOSE 107* 113* 100* 85  BUN 21 18 13 10   CREATININE 1.21* 0.94 0.80 0.75  CALCIUM 8.8* 8.7* 9.1 9.1  MG 1.9 2.2 1.7 1.9  PHOS  --  3.8 3.4 4.2   Liver Function Tests: Recent Labs  Lab 08/22/21 0212 08/22/21 0844 08/23/21 0213 08/24/21 0205  AST 20 22 20 21   ALT 17 17 18 18   ALKPHOS 53 53 52 54  BILITOT 1.0 0.5 0.7 0.9  PROT 6.6 6.3* 6.6 6.5  ALBUMIN 3.7 3.5 3.6 3.5   CBG: Recent Labs  Lab 08/23/21 0750 08/23/21 1149 08/23/21 2104 08/24/21 0735 08/24/21 1143  GLUCAP 116* 88 124* 103* 98   Discharge time spent: greater than 30 minutes.  Signed: Raiford Noble, DO [facility] 08/24/2021

## 2021-08-24 NOTE — Progress Notes (Signed)
Progress Note  Patient Name: [name] Date of Encounter: 08/24/2021  Primary Cardiologist: Dorris Carnes, MD   Subjective   Feeling much better. No chest pain or sob. Her nausea and vomiting are resolved.   Inpatient Medications    Scheduled Meds:  aspirin EC  81 mg Oral Daily   azithromycin  500 mg Oral Daily   ezetimibe  10 mg Oral Daily   gabapentin  600 mg Oral TID   guaiFENesin  1,200 mg Oral BID   insulin aspart  0-6 Units Subcutaneous TID WC   metoprolol succinate  12.5 mg Oral Daily   potassium chloride SA  20 mEq Oral Daily   QUEtiapine  25 mg Oral QHS   Continuous Infusions:  sodium chloride 50 mL/hr at 08/23/21 1906   cefTRIAXone (ROCEPHIN)  IV 1 g (08/24/21 0846)   PRN Meds: acetaminophen **OR** acetaminophen, clonazePAM, melatonin, ondansetron (ZOFRAN) IV, prochlorperazine   Vital Signs    Vitals:   08/24/21 0108 08/24/21 0504 08/24/21 0732 08/24/21 0837  BP: (!) 146/73 136/68 130/60   Pulse: 64 61 61 84  Resp: '17 17 18   '$ Temp: 97.6 F (36.4 C) 97.6 F (36.4 C) (!) 97.5 F (36.4 C)   TempSrc: Oral Oral Oral   SpO2: 97% 96% 97%   Weight:  79.1 kg      Intake/Output Summary (Last 24 hours) at 08/24/2021 1019 Last data filed at 08/24/2021 0400 Gross per 24 hour  Intake 895 ml  Output --  Net 895 ml   Filed Weights   08/23/21 0421 08/24/21 0504  Weight: 78.6 kg 79.1 kg    Telemetry    nsr - Personally Reviewed  ECG    none - Personally Reviewed  Physical Exam   GEN: No acute distress.   Neck: No JVD Cardiac: RRR, no murmurs, rubs, or gallops.  Respiratory: Clear to auscultation bilaterally. GI: Soft, nontender, non-distended  MS: No edema; No deformity. Neuro:  Nonfocal  Psych: Normal affect   Labs    Chemistry Recent Labs  Lab 08/22/21 0844 08/23/21 0213 08/24/21 0205  NA 139 140 140  K 3.6 3.4* 3.8  CL 104 106 107  CO2 '26 23 24  '$ GLUCOSE 113* 100* 85  BUN '18 13 10  '$ CREATININE 0.94 0.80 0.75  CALCIUM 8.7*  9.1 9.1  PROT 6.3* 6.6 6.5  ALBUMIN 3.5 3.6 3.5  AST '22 20 21  '$ ALT '17 18 18  '$ ALKPHOS 53 52 54  BILITOT 0.5 0.7 0.9  GFRNONAA >60 >60 >60  ANIONGAP '9 11 9     '$ Hematology Recent Labs  Lab 08/22/21 0844 08/23/21 0213 08/24/21 0205  WBC 5.1 4.8 4.5  RBC 3.91 4.07 3.98  HGB 12.5 13.0 12.5  HCT 36.0 36.0 35.8*  MCV 92.1 88.5 89.9  MCH 32.0 31.9 31.4  MCHC 34.7 36.1* 34.9  RDW 12.1 11.9 11.9  PLT 157 188 206    Cardiac EnzymesNo results for input(s): TROPONINI in the last 168 hours. No results for input(s): TROPIPOC in the last 168 hours.   BNP Recent Labs  Lab 08/22/21 0212  BNP 172.3*     DDimer No results for input(s): DDIMER in the last 168 hours.   Radiology    CT ABDOMEN PELVIS W CONTRAST  Result Date: 08/23/2021 CLINICAL DATA:  Abdominal pain, nausea vomiting. EXAM: CT ABDOMEN AND PELVIS WITH CONTRAST TECHNIQUE: Multidetector CT imaging of the abdomen and pelvis was performed using the standard protocol following bolus administration of  intravenous contrast. RADIATION DOSE REDUCTION: This exam was performed according to the departmental dose-optimization program which includes automated exposure control, adjustment of the mA and/or kV according to patient size and/or use of iterative reconstruction technique. CONTRAST:  121m OMNIPAQUE IOHEXOL 300 MG/ML  SOLN COMPARISON:  CT abdomen pelvis dated 12/04/2016. FINDINGS: Lower chest: The visualized lung bases are clear. No intra-abdominal free air or free fluid. Hepatobiliary: The liver is unremarkable. No intrahepatic biliary dilatation. Cholecystectomy. No retained calcified stone noted in the central CBD. Pancreas: Unremarkable. No pancreatic ductal dilatation or surrounding inflammatory changes. Spleen: Normal in size without focal abnormality. Adrenals/Urinary Tract: Similar appearance of peripherally calcified left adrenal lesions, possibly sequela of prior insult or hemorrhage. The right adrenal gland is unremarkable.  There is no hydronephrosis on either side. There is symmetric enhancement and excretion of contrast by both kidneys. The visualized ureters and urinary bladder appear unremarkable. Stomach/Bowel: Mild thickened appearance of the colon may be related to underdistention. Clinical correlation is recommended to evaluate for colitis. There is no bowel obstruction. Appendectomy. Vascular/Lymphatic: Moderate aortoiliac atherosclerotic disease. The IVC is unremarkable. No portal venous gas. There is no adenopathy. Reproductive: Hysterectomy.  No adnexal masses. Other: Anterior abdominal wall surgical staples. Musculoskeletal: Degenerative changes of the spine. L2-L3 disc spacer and posterior fusion. No acute osseous pathology. IMPRESSION: 1. Underdistention of the colon versus mild colitis. No bowel obstruction. 2. Aortic Atherosclerosis (ICD10-I70.0). Electronically Signed   By: AAnner CreteM.D.   On: 08/23/2021 19:36   ECHOCARDIOGRAM COMPLETE  Result Date: 08/22/2021    ECHOCARDIOGRAM REPORT   Patient Name:   KSADAF PRZYBYSZWFirst SurgicenterDate of Exam: 08/22/2021 Medical Rec #:  [id number]         Height:       68.0 in Accession #:    [id number]        Weight:       183.0 lb Date of Birth:  907/20/1952         BSA:          1.968 m Patient Age:    89years           BP:           136/54 mmHg Patient Gender: F                  HR:           70 bpm. Exam Location:  Inpatient Procedure: 2D Echo, Cardiac Doppler, Color Doppler and Intracardiac            Opacification Agent Indications:    NSTEMI  History:        Patient has prior history of Echocardiogram examinations, most                 recent 01/06/2021. CHF, CAD; Risk Factors:Diabetes, Dyslipidemia                 and Hypertension.  Sonographer:    AClayton LefortRDCS (AE) Referring Phys: [id number] Mura Sonographer Comments: Suboptimal subcostal window and Technically difficult study due to poor echo windows. IMPRESSIONS  1. Septal apical and inferior apical  hypokinesis . Left ventricular ejection fraction, by estimation, is 40 to 45%. The left ventricle has mildly decreased function. The left ventricle demonstrates regional wall motion abnormalities (see scoring diagram/findings for description). The left ventricular internal cavity size was mildly dilated. There is mild left ventricular hypertrophy. Left ventricular diastolic parameters were normal.  2. Right  ventricular systolic function is normal. The right ventricular size is normal.  3. Left atrial size was moderately dilated.  4. The mitral valve is abnormal. Mild mitral valve regurgitation. No evidence of mitral stenosis.  5. The aortic valve is tricuspid. There is moderate calcification of the aortic valve. There is moderate thickening of the aortic valve. Aortic valve regurgitation is not visualized. Mild aortic valve stenosis.  6. The inferior vena cava is normal in size with greater than 50% respiratory variability, suggesting right atrial pressure of 3 mmHg. FINDINGS  Left Ventricle: Septal apical and inferior apical hypokinesis. Left ventricular ejection fraction, by estimation, is 40 to 45%. The left ventricle has mildly decreased function. The left ventricle demonstrates regional wall motion abnormalities. Definity contrast agent was given IV to delineate the left ventricular endocardial borders. The left ventricular internal cavity size was mildly dilated. There is mild left ventricular hypertrophy. Left ventricular diastolic parameters were normal. Right Ventricle: The right ventricular size is normal. No increase in right ventricular wall thickness. Right ventricular systolic function is normal. Left Atrium: Left atrial size was moderately dilated. Right Atrium: Right atrial size was normal in size. Pericardium: There is no evidence of pericardial effusion. Mitral Valve: The mitral valve is abnormal. There is mild thickening of the mitral valve leaflet(s). There is mild calcification of the mitral  valve leaflet(s). Mild mitral valve regurgitation. No evidence of mitral valve stenosis. Tricuspid Valve: The tricuspid valve is normal in structure. Tricuspid valve regurgitation is mild . No evidence of tricuspid stenosis. Aortic Valve: The aortic valve is tricuspid. There is moderate calcification of the aortic valve. There is moderate thickening of the aortic valve. Aortic valve regurgitation is not visualized. Mild aortic stenosis is present. Aortic valve mean gradient measures 7.0 mmHg. Aortic valve peak gradient measures 12.0 mmHg. Aortic valve area, by VTI measures 1.88 cm. Pulmonic Valve: The pulmonic valve was normal in structure. Pulmonic valve regurgitation is not visualized. No evidence of pulmonic stenosis. Aorta: The aortic root is normal in size and structure. Venous: The inferior vena cava is normal in size with greater than 50% respiratory variability, suggesting right atrial pressure of 3 mmHg. IAS/Shunts: No atrial level shunt detected by color flow Doppler.  LEFT VENTRICLE PLAX 2D LVIDd:         4.30 cm   Diastology LVIDs:         3.50 cm   LV e' medial:    4.57 cm/s LV PW:         1.10 cm   LV E/e' medial:  20.8 LV IVS:        1.30 cm   LV e' lateral:   9.36 cm/s LVOT diam:     2.20 cm   LV E/e' lateral: 10.2 LV SV:         68 LV SV Index:   34 LVOT Area:     3.80 cm  RIGHT VENTRICLE RV Basal diam:  2.70 cm RV S prime:     13.50 cm/s TAPSE (M-mode): 2.4 cm LEFT ATRIUM             Index        RIGHT ATRIUM           Index LA diam:        4.30 cm 2.18 cm/m   RA Area:     14.90 cm LA Vol (A2C):   58.7 ml 29.83 ml/m  RA Volume:   34.30 ml  17.43 ml/m LA Vol (A4C):  52.0 ml 26.42 ml/m LA Biplane Vol: 56.0 ml 28.46 ml/m  AORTIC VALVE AV Area (Vmax):    1.80 cm AV Area (Vmean):   1.73 cm AV Area (VTI):     1.88 cm AV Vmax:           173.00 cm/s AV Vmean:          119.000 cm/s AV VTI:            0.360 m AV Peak Grad:      12.0 mmHg AV Mean Grad:      7.0 mmHg LVOT Vmax:         82.10 cm/s  LVOT Vmean:        54.100 cm/s LVOT VTI:          0.178 m LVOT/AV VTI ratio: 0.49  AORTA Ao Root diam: 3.20 cm Ao Asc diam:  3.40 cm MITRAL VALVE MV Area (PHT): 4.08 cm    SHUNTS MV Decel Time: 186 msec    Systemic VTI:  0.18 m MV E velocity: 95.10 cm/s  Systemic Diam: 2.20 cm MV A velocity: 85.90 cm/s MV E/A ratio:  1.11 Jenkins Rouge MD Electronically signed by Jenkins Rouge MD Signature Date/Time: 08/22/2021/5:33:38 PM    Final     Cardiac Studies   See above.  Patient Profile     71 y.o. female admitted with acute GI illness with minimal troponin elevation  Assessment & Plan    3 vessel CAD - I discussed her disease with her in detail. She is asymptomatic!. I explained the importance of keeping her appointment with Dr. PVT. She is not sure that she would even consider bypass surgery. She is currently without symptoms.  GI illness - she has resolved her symptoms.  [facility] will sign off.   Medication Recommendations:  continue preop meds Other recommendations (labs, testing, etc):  none Follow up as an outpatient:  Dr. PVT to discuss CABG  For questions or updates, please contact [facility] Please consult [email or web address] for contact info under Cardiology/STEMI.      Signed, Cristopher Peru, MD  08/24/2021, 10:19 AM

## 2021-08-26 ENCOUNTER — Other Ambulatory Visit: Payer: Self-pay | Admitting: Family Medicine

## 2021-08-26 ENCOUNTER — Telehealth: Payer: Self-pay

## 2021-08-26 ENCOUNTER — Encounter: Payer: Self-pay | Admitting: Family Medicine

## 2021-08-26 LAB — GLUCOSE, CAPILLARY: Glucose-Capillary: 105 mg/dL — ABNORMAL HIGH (ref 70–99)

## 2021-08-26 NOTE — Telephone Encounter (Signed)
Transition Care Management Follow-up Telephone Call Date of discharge and from where: [facility] [place] 08-24-21 Dx: NSTEMI How have you been since you were released from the hospital? Doing ok  Any questions or concerns? No  Items Reviewed: Did the pt receive and understand the discharge instructions provided? Yes  Medications obtained and verified? Yes  Other? No  Any new allergies since your discharge? no Dietary orders reviewed? Yes Do you have support at home? Yes   Home Care and Equipment/Supplies: Were home health services ordered? no If so, what is the name of the agency? na  Has the agency set up a time to come to the patient's home? not applicable Were any new equipment or medical supplies ordered?  No What is the name of the medical supply agency? na Were you able to get the supplies/equipment? not applicable Do you have any questions related to the use of the equipment or supplies? No  Functional Questionnaire: (I = Independent and D = Dependent) ADLs: I  Bathing/Dressing- I  Meal Prep- I  Eating- I  Maintaining continence- I  Transferring/Ambulation- I  Managing Meds- I  Follow up appointments reviewed:  PCP Hospital f/u appt confirmed? Yes  Scheduled to see Dr Martinique  on 09-04-21 @ [facility] f/u appt confirmed? Yes  Scheduled to see Dr Joya Gaskins  on 09-09-21 @ 3pm. Are transportation arrangements needed? No  If their condition worsens, is the pt aware to call PCP or go to the Emergency Dept.? Yes Was the patient provided with contact information for the PCP's office or ED? Yes Was to pt encouraged to call back with questions or concerns? Yes

## 2021-08-27 ENCOUNTER — Encounter: Payer: Self-pay | Admitting: Family Medicine

## 2021-08-27 LAB — CULTURE, BLOOD (ROUTINE X 2)
Culture: NO GROWTH
Culture: NO GROWTH
Special Requests: ADEQUATE

## 2021-08-27 MED ORDER — ONDANSETRON 4 MG PO TBDP: 4.0000 mg | ORAL_TABLET | Freq: Every day | ORAL | 2 refills | Status: DC | PRN

## 2021-08-27 MED ORDER — EZETIMIBE 10 MG PO TABS: ORAL_TABLET | ORAL | 2 refills | Status: DC

## 2021-08-29 ENCOUNTER — Encounter: Payer: Self-pay | Admitting: Family Medicine

## 2021-08-29 ENCOUNTER — Ambulatory Visit: Payer: Medicare Other | Admitting: Orthopaedic Surgery

## 2021-08-30 ENCOUNTER — Other Ambulatory Visit: Payer: Self-pay

## 2021-08-30 MED ORDER — TRULICITY 1.5 MG/0.5ML ~~LOC~~ SOAJ: 1.5000 mg | SUBCUTANEOUS | 3 refills | Status: DC

## 2021-08-30 NOTE — Telephone Encounter (Signed)
I called [name] and she did not answer. I left a voicemail asking for a call back and filled out and printed out a new application for her to sign. You may want to respond to her in the mychart encounter to see if she can call here ASAP. I don't have patients from now until 1 so if she calls during that time I can speak with her. Just let me know!

## 2021-09-03 NOTE — Progress Notes (Unsigned)
HPI: [name] is a 71 y.o. female with history of DM 2, anxiety, CAD, chronic pain, and hypertension here today to follow on recent hospitalization. Hospitalized on 08/22/2021 and discharged on 08/24/2021. TCM call on 08/26/21.  She presented to the ED with nausea, vomiting, and diarrhea.  During initial evaluation it was noted elevated troponin at 779, so she was transferred to [facility]. She has some chest discomfort a few days before hospitalization that improved with antibiotic treatment. Coronary angiography in December 2022 revealing multivessel CAD was not amenable to PCI.  Due to AKI her HCTZ was held.  She is on metoprolol succinate 25 mg 1/2 tablet daily. Echo 08/22/21: LVEF 53-29%, normal diastolic function. Cardiology consultation during hospitalization did not think symptoms were caused by ACS. Troponin elevation most likely related to demand ischemia due to dehydration.  Lab Results  Component Value Date   CREATININE 0.75 08/24/2021   BUN 10 08/24/2021   NA 140 08/24/2021   K 3.8 08/24/2021   CL 107 08/24/2021   CO2 24 08/24/2021   GI evaluation during hospitalization due to question of melena, diagnosed with infectious gastroenteritis. H/H stable during hospitalization. Abdominal CT showed under distention versus mild colitis. Diarrhea has resolved. Negative for abdominal pain, Nausea, and vomiting. *** Lab Results  Component Value Date   WBC 4.5 08/24/2021   HGB 12.5 08/24/2021   HCT 35.8 (L) 08/24/2021   MCV 89.9 08/24/2021   PLT 206 08/24/2021   Chest x-ray on 08/22/2021 showed increasing right upper lobe airspace opacity. She completed antibiotic treatment, Zithromax and ceftriaxone during hospitalization were changed to oral azithromycin and cefdinir. He was recommended to repeat chest x-ray in 3 to 6 weeks. Negative for cough, dyspnea, or wheezing.  Lab Results  Component Value Date   CHOL 187 08/24/2021   HDL 27 (L) 08/24/2021    LDLCALC 115 (H) 08/24/2021   LDLDIRECT 140.0 01/28/2021   TRIG 223 (H) 08/24/2021   CHOLHDL 6.9 08/24/2021   Lab Results  Component Value Date   HGBA1C 6.3 (H) 08/22/2021  Gabapentin dose was adjusted and 8 is helping with neuropathy. *** Review of Systems  Constitutional:  Positive for fatigue. Negative for activity change and fever.  HENT:  Negative for mouth sores and sore throat.   Cardiovascular:  Negative for chest pain, palpitations and leg swelling.  Genitourinary:  Negative for decreased urine volume, dysuria and hematuria.  Neurological:  Negative for syncope, facial asymmetry, weakness and headaches.  Psychiatric/Behavioral:  Negative for confusion. The patient is nervous/anxious.   Rest see pertinent positives and negatives per HPI.  Current Outpatient Medications on File Prior to Visit  Medication Sig Dispense Refill   albuterol (VENTOLIN HFA) 108 (90 Base) MCG/ACT inhaler INHALE 2 PUFFS INTO THE LUNGS EVERY 6 HOURS AS NEEDED FOR WHEEZING OR SHORTNESS OF BREATH 18 g 2   Alcohol Swabs (B-D SINGLE USE SWABS REGULAR) PADS Use to test blood sugar 3 times daily 100 each 3   aspirin EC 81 MG tablet Take 81 mg by mouth daily. Swallow whole.     Blood Glucose Calibration (ACCU-CHEK AVIVA) SOLN Use as directed 1 each 2   cefdinir (OMNICEF) 300 MG capsule Take 1 capsule (300 mg total) by mouth 2 (two) times daily. 6 capsule 0   clonazePAM (KLONOPIN) 1 MG tablet TAKE 1 TABLET(1 MG) BY MOUTH TWICE DAILY AS NEEDED FOR ANXIETY 60 tablet 3   ezetimibe (ZETIA) 10 MG tablet TAKE 1 TABLET(10 MG) BY MOUTH DAILY 90  tablet 2   fluticasone (FLONASE) 50 MCG/ACT nasal spray Place 2 sprays into both nostrils daily as needed for allergies or rhinitis. 16 g 3   gabapentin (NEURONTIN) 600 MG tablet Take 1 tablet (600 mg total) by mouth in the morning, at noon, in the evening, and at bedtime. 120 tablet 3   hydrochlorothiazide (HYDRODIURIL) 25 MG tablet TAKE 1 TABLET BY MOUTH EVERY DAY 90 tablet 3    HYDROcodone-acetaminophen (NORCO) 7.5-325 MG tablet Take 1 tablet by mouth every 6 (six) hours as needed for moderate pain. 120 tablet 0   Magnesium 250 MG TABS Take 250 mg by mouth daily.     metoprolol succinate (TOPROL XL) 25 MG 24 hr tablet Take 0.5 tablets (12.5 mg total) by mouth at bedtime. 45 tablet 3   omeprazole (PRILOSEC) 40 MG capsule Take 1 capsule (40 mg total) by mouth daily. 90 capsule 2   ondansetron (ZOFRAN-ODT) 4 MG disintegrating tablet Take 1 tablet (4 mg total) by mouth daily as needed. 20 tablet 2   potassium chloride SA (KLOR-CON M) 20 MEQ tablet TAKE 1 TABLET BY MOUTH EVERY DAY 30 tablet 3   QUEtiapine (SEROQUEL) 100 MG tablet Take 0.5 tablets (50 mg total) by mouth at bedtime. 45 tablet 3   triamcinolone (KENALOG) 0.1 % paste Use as directed 1 application. in the mouth or throat 2 (two) times daily. 5 g 1   No current facility-administered medications on file prior to visit.    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[place])    Anxiety    Arthritis    Diabetes mellitus without complication (HCC)    GERD (gastroesophageal reflux disease)    Heart murmur    Hyperlipidemia    Hypertension    Neuromuscular disorder (HCC)    Palpitations    Pneumonia    PONV (postoperative nausea and vomiting)    PTSD (post-traumatic stress disorder)    Ringing of ears, left    Allergies  Allergen Reactions   Hydromorphone Nausea And Vomiting and Other (See Comments)    Made the patient feel "spaced out,"     Morphine And Related Itching    With high doses   Other Itching   Penicillins Rash    Has patient had a PCN reaction causing immediate rash, facial/tongue/throat swelling, SOB or lightheadedness with hypotension: Yes Has patient had a PCN reaction causing severe rash involving mucus membranes or skin necrosis: No Has patient had a PCN reaction that required hospitalization: No Has patient had a PCN reaction occurring within the last 10 years: No If  all of the above answers are "NO", then may proceed with Cephalosporin use.     Social History   Socioeconomic History   Marital status: Widowed    Spouse name: Not on file   Number of children: 1   Years of education: Not on file   Highest education level: High school graduate  Occupational History   Occupation: retired    Comment: work part time  Tobacco Use   Smoking status: Never   Smokeless tobacco: Never  Vaping Use   Vaping Use: Never used  Substance and Sexual Activity   Alcohol use: No   Drug use: No   Sexual activity: Not on file  Other Topics Concern   Not on file  Social History Narrative   Not on file   Social Determinants of Health   Financial Resource Strain: Low Risk    Difficulty of Paying  Living Expenses: Not hard at all  Food Insecurity: No Food Insecurity   Worried About [place] in the Last Year: Never true   Ran Out of Food in the Last Year: Never true  Transportation Needs: No Transportation Needs   Lack of Transportation (Medical): No   Lack of Transportation (Non-Medical): No  Physical Activity: Insufficiently Active   Days of Exercise per Week: 2 days   Minutes of Exercise per Session: 40 min  Stress: No Stress Concern Present   Feeling of Stress : Not at all  Social Connections: Moderately Integrated   Frequency of Communication with Friends and Family: Twice a week   Frequency of Social Gatherings with Friends and Family: Twice a week   Attends Religious Services: More than 4 times per year   Active Member of [employer] or Organizations: Yes   Attends Archivist Meetings: 1 to 4 times per year   Marital Status: Widowed   Vitals:   09/04/21 1411  BP: 138/72  Pulse: 85  SpO2: 97%   Body mass index is 27.06 kg/m.  Physical Exam Vitals and nursing note reviewed.  Constitutional:      General: She is not in acute distress.    Appearance: She is well-developed.  HENT:     Head: Normocephalic and atraumatic.      Mouth/Throat:     Mouth: Mucous membranes are moist.     Pharynx: Oropharynx is clear.  Eyes:     Conjunctiva/sclera: Conjunctivae normal.  Cardiovascular:     Rate and Rhythm: Normal rate. Rhythm irregular.     Pulses:          Dorsalis pedis pulses are 2+ on the right side and 2+ on the left side.     Heart sounds: Murmur (SEM I/VI LUSB and RUSB) heard.  Pulmonary:     Effort: Pulmonary effort is normal. No respiratory distress.     Breath sounds: Normal breath sounds.  Abdominal:     Palpations: Abdomen is soft. There is no hepatomegaly or mass.     Tenderness: There is no abdominal tenderness.  Lymphadenopathy:     Cervical: No cervical adenopathy.  Skin:    General: Skin is warm.     Findings: No erythema or rash.  Neurological:     General: No focal deficit present.     Mental Status: She is alert and oriented to person, place, and time.     Cranial Nerves: No cranial nerve deficit.     Gait: Gait normal.  Psychiatric:        Mood and Affect: Mood is anxious.     Comments: Well groomed, good eye contact.   ASSESSMENT AND PLAN:  Ms.Aretha was seen today for hospitalization follow-up.  Diagnoses and all orders for this visit: Orders Placed This Encounter  Procedures   DG Chest 2 View   Comprehensive metabolic panel   Magnesium   Phosphorus   Pneumonia, lobar ([place]) Symptoms have resolved. Lung auscultation negative today. Chest x-ray to be done in 3 to 4 weeks.  HYPOKALEMIA HCTZ has been discontinued. Continue K-Lor 20 mK daily. Further recommendations according to lab results.  Essential hypertension BP adequately controlled. Continue Metoprolol succinate 25 mg 1/2 tablet daily. Continue low-salt/DASH diet.  Type 2 diabetes mellitus with neurological complications ([place]) Problem is well controlled. Continue Trulicity 1.5 mg weekly. Jardiance 10 mg added. Eye exam in 10/2021. She has dental appointment next week. Continue appropriate foot  care.  Coronary artery  disease involving native coronary artery of native heart without angina pectoris She has appointment with cardiothoracic surgeon to discuss treatment (surgical versus medical management). Continue metoprolol. She has not tolerated statins in the past, continue Zetia 10 mg daily. PCSK9 inhibitor was recommended, she is afraid of side effects.  Appointment with lipid clinic is being arranged.  Chronic systolic CHF (congestive heart failure) (HCC) Continue metoprolol succinate 25 mg 1/2 tablet daily. She is not taking Jardiance 10 mg, prescription sent to her pharmacy. Continue low-salt diet. Follow-up with cardiologist.   Return in about 4 months (around 01/04/2022).   Phaedra Colgate G. [place], MD  [facility]. [place] office.

## 2021-09-04 ENCOUNTER — Other Ambulatory Visit: Payer: Self-pay

## 2021-09-04 ENCOUNTER — Encounter: Payer: Self-pay | Admitting: Family Medicine

## 2021-09-04 ENCOUNTER — Ambulatory Visit (INDEPENDENT_AMBULATORY_CARE_PROVIDER_SITE_OTHER): Payer: Medicare Other | Admitting: Family Medicine

## 2021-09-04 VITALS — BP 138/72 | HR 85 | Resp 16 | Ht 68.0 in | Wt 178.0 lb

## 2021-09-04 DIAGNOSIS — I10 Essential (primary) hypertension: Secondary | ICD-10-CM

## 2021-09-04 DIAGNOSIS — I5022 Chronic systolic (congestive) heart failure: Secondary | ICD-10-CM

## 2021-09-04 DIAGNOSIS — E1149 Type 2 diabetes mellitus with other diabetic neurological complication: Secondary | ICD-10-CM

## 2021-09-04 DIAGNOSIS — J181 Lobar pneumonia, unspecified organism: Secondary | ICD-10-CM

## 2021-09-04 DIAGNOSIS — E876 Hypokalemia: Secondary | ICD-10-CM

## 2021-09-04 DIAGNOSIS — I2510 Atherosclerotic heart disease of native coronary artery without angina pectoris: Secondary | ICD-10-CM | POA: Diagnosis not present

## 2021-09-04 MED ORDER — JARDIANCE 10 MG PO TABS: 10.0000 mg | ORAL_TABLET | Freq: Every day | ORAL | 1 refills | Status: DC

## 2021-09-04 MED ORDER — ONETOUCH VERIO FLEX SYSTEM W/DEVICE KIT: PACK | 0 refills | Status: AC

## 2021-09-04 MED ORDER — TRULICITY 1.5 MG/0.5ML ~~LOC~~ SOAJ: 1.5000 mg | SUBCUTANEOUS | 3 refills | Status: DC

## 2021-09-04 MED ORDER — ONETOUCH VERIO VI STRP: ORAL_STRIP | 12 refills | Status: DC

## 2021-09-04 MED ORDER — ONETOUCH ULTRASOFT LANCETS MISC: 12 refills | Status: AC

## 2021-09-04 NOTE — Assessment & Plan Note (Signed)
Problem is well controlled. Continue Trulicity 1.5 mg weekly. Jardiance 10 mg added. Eye exam in 10/2021. She has dental appointment next week. Continue appropriate foot care.

## 2021-09-04 NOTE — Assessment & Plan Note (Signed)
She has appointment with cardiothoracic surgeon to discuss treatment (surgical versus medical management). Continue metoprolol. She has not tolerated statins in the past, continue Zetia 10 mg daily. PCSK9 inhibitor was recommended, she is afraid of side effects.  Appointment with lipid clinic is being arranged.

## 2021-09-04 NOTE — Assessment & Plan Note (Signed)
HCTZ has been discontinued. Continue K-Lor 20 mK daily. Further recommendations according to lab results.

## 2021-09-04 NOTE — Assessment & Plan Note (Signed)
BP adequately controlled. Continue Metoprolol succinate 25 mg 1/2 tablet daily. Continue low-salt/DASH diet.

## 2021-09-04 NOTE — Patient Instructions (Addendum)
A few things to remember from today's visit:   Essential hypertension - Plan: Comprehensive metabolic panel  Pneumonia, lobar (HCC)  Type 2 diabetes mellitus with neurological complications (HCC)  Coronary artery disease involving native coronary artery of native heart without angina pectoris  HYPOKALEMIA - Plan: Comprehensive metabolic panel, Magnesium, Phosphorus  Chronic systolic CHF (congestive heart failure) ([place])  If you need refills please call your pharmacy. Do not use My Chart to request refills or for acute issues that need immediate attention.   No changes today. Jardiance 10 mg sent to the pharmacy. Please be sure medication list is accurate. If a new problem present, please set up appointment sooner than planned today.

## 2021-09-04 NOTE — Assessment & Plan Note (Signed)
Continue metoprolol succinate 25 mg 1/2 tablet daily. She is not taking Jardiance 10 mg, prescription sent to her pharmacy. Continue low-salt diet. Follow-up with cardiologist.

## 2021-09-05 ENCOUNTER — Other Ambulatory Visit (HOSPITAL_COMMUNITY): Payer: Self-pay

## 2021-09-05 LAB — COMPREHENSIVE METABOLIC PANEL
ALT: 21 U/L (ref 0–35)
AST: 22 U/L (ref 0–37)
Albumin: 4.3 g/dL (ref 3.5–5.2)
Alkaline Phosphatase: 68 U/L (ref 39–117)
BUN: 23 mg/dL (ref 6–23)
CO2: 27 mEq/L (ref 19–32)
Calcium: 9.5 mg/dL (ref 8.4–10.5)
Chloride: 102 mEq/L (ref 96–112)
Creatinine, Ser: 1.13 mg/dL (ref 0.40–1.20)
GFR: 49.58 mL/min — ABNORMAL LOW (ref >60.00)
Glucose, Bld: 101 mg/dL — ABNORMAL HIGH (ref 70–99)
Potassium: 3.9 mEq/L (ref 3.5–5.1)
Sodium: 140 mEq/L (ref 135–145)
Total Bilirubin: 0.6 mg/dL (ref 0.2–1.2)
Total Protein: 7 g/dL (ref 6.0–8.3)

## 2021-09-05 LAB — MAGNESIUM: Magnesium: 1.9 mg/dL (ref 1.5–2.5)

## 2021-09-05 LAB — PHOSPHORUS: Phosphorus: 4.4 mg/dL (ref 2.3–4.6)

## 2021-09-07 ENCOUNTER — Encounter (HOSPITAL_COMMUNITY): Payer: Self-pay

## 2021-09-09 ENCOUNTER — Ambulatory Visit: Payer: Medicare Other | Admitting: Cardiothoracic Surgery

## 2021-09-09 ENCOUNTER — Encounter: Payer: Self-pay | Admitting: Family Medicine

## 2021-09-12 ENCOUNTER — Telehealth: Payer: Self-pay | Admitting: Orthopaedic Surgery

## 2021-09-12 NOTE — Telephone Encounter (Signed)
FYI I rescheduled an appointment for patient at the [facility]. I called and left voicemail message advising [name] that appointment was rescheduled for 09/26/2021 at 2:15 pm. Patient said she was in the hospital and couldn't come to her last appointment.

## 2021-09-17 ENCOUNTER — Encounter (HOSPITAL_COMMUNITY): Payer: Medicare Other

## 2021-09-23 ENCOUNTER — Ambulatory Visit: Payer: Medicare Other | Admitting: Cardiothoracic Surgery

## 2021-09-23 ENCOUNTER — Encounter: Payer: Self-pay | Admitting: Cardiothoracic Surgery

## 2021-09-23 VITALS — BP 166/60 | HR 64 | Resp 20 | Ht 68.0 in | Wt 179.0 lb

## 2021-09-23 DIAGNOSIS — I2510 Atherosclerotic heart disease of native coronary artery without angina pectoris: Secondary | ICD-10-CM | POA: Diagnosis not present

## 2021-09-23 NOTE — Progress Notes (Signed)
HPI: The patient returns to the clinic to discuss treatment of her two-vessel coronary disease.  She remains at a normal functional status however after discussing her daily activities and symptoms it is apparent that she probably is having anginal equivalent with neck and jaw pain when she exerts herself.  She states it happens 2 or 3 times a week and resolves quickly when she rests.  We discussed her recent hospitalization for pneumonia and colitis for which she is still recovering.  She had an enzyme leak during that hospitalization.  A subsequent echo showed no change in her LVEF at 40%.  Patient is still very hesitant to consider surgery while she is doing well and feels she is free of symptoms although now she understands that the intermittent exertional jaw pain is probably her anginal equivalent.  She understands she needs to pay close attention to the frequency intensity of that symptom.  The patient has had her echo and cardiac cath.  Prior to surgery she would need her carotid Dopplers as well as vein mapping as she has history of vein surgery on her right leg in the past and we have no records available.  She is encouraged to keep her appointment at the advanced heart failure clinic for medical therapy of her mild-moderate LV dysfunction.  Current Outpatient Medications  Medication Sig Dispense Refill   albuterol (VENTOLIN HFA) 108 (90 Base) MCG/ACT inhaler INHALE 2 PUFFS INTO THE LUNGS EVERY 6 HOURS AS NEEDED FOR WHEEZING OR SHORTNESS OF BREATH 18 g 2   Alcohol Swabs (B-D SINGLE USE SWABS REGULAR) PADS Use to test blood sugar 3 times daily 100 each 3   aspirin EC 81 MG tablet Take 81 mg by mouth daily. Swallow whole.     Blood Glucose Calibration (ACCU-CHEK AVIVA) SOLN Use as directed 1 each 2   Blood Glucose Monitoring Suppl (ONETOUCH VERIO FLEX SYSTEM) w/Device KIT Use to test blood sugars 1-2 times daily. 1 kit 0   cefdinir (OMNICEF) 300 MG capsule Take 1 capsule (300 mg total) by  mouth 2 (two) times daily. 6 capsule 0   clonazePAM (KLONOPIN) 1 MG tablet TAKE 1 TABLET(1 MG) BY MOUTH TWICE DAILY AS NEEDED FOR ANXIETY 60 tablet 3   Dulaglutide (TRULICITY) 1.5 AG/5.3MI SOPN Inject 1.5 mg into the skin once a week. 13 mL 3   ezetimibe (ZETIA) 10 MG tablet TAKE 1 TABLET(10 MG) BY MOUTH DAILY 90 tablet 2   fluticasone (FLONASE) 50 MCG/ACT nasal spray Place 2 sprays into both nostrils daily as needed for allergies or rhinitis. 16 g 3   gabapentin (NEURONTIN) 600 MG tablet Take 1 tablet (600 mg total) by mouth in the morning, at noon, in the evening, and at bedtime. 120 tablet 3   glucose blood (ONETOUCH VERIO) test strip Use to test blood sugar 1-2 times daily. 100 each 12   hydrochlorothiazide (HYDRODIURIL) 25 MG tablet TAKE 1 TABLET BY MOUTH EVERY DAY 90 tablet 3   HYDROcodone-acetaminophen (NORCO) 7.5-325 MG tablet Take 1 tablet by mouth every 6 (six) hours as needed for moderate pain. 120 tablet 0   JARDIANCE 10 MG TABS tablet Take 1 tablet (10 mg total) by mouth daily. 90 tablet 1   Lancets (ONETOUCH ULTRASOFT) lancets Use to test blood sugar 1-2 times daily. 100 each 12   Magnesium 250 MG TABS Take 250 mg by mouth daily.     metoprolol succinate (TOPROL XL) 25 MG 24 hr tablet Take 0.5 tablets (12.5 mg total) by mouth  at bedtime. 45 tablet 3   omeprazole (PRILOSEC) 40 MG capsule Take 1 capsule (40 mg total) by mouth daily. 90 capsule 2   ondansetron (ZOFRAN-ODT) 4 MG disintegrating tablet Take 1 tablet (4 mg total) by mouth daily as needed. 20 tablet 2   potassium chloride SA (KLOR-CON M) 20 MEQ tablet TAKE 1 TABLET BY MOUTH EVERY DAY 30 tablet 3   QUEtiapine (SEROQUEL) 100 MG tablet Take 0.5 tablets (50 mg total) by mouth at bedtime. 45 tablet 3   triamcinolone (KENALOG) 0.1 % paste Use as directed 1 application. in the mouth or throat 2 (two) times daily. 5 g 1   No current facility-administered medications for this visit.     Physical Exam:  Blood pressure (!)  166/60, pulse 64, resp. rate 20, height 5' 8"  (1.727 m), weight 179 lb (81.2 kg), SpO2 97 %.         Exam    General- alert and comfortable    Neck- no JVD, no cervical adenopathy palpable, no carotid bruit   Lungs- clear without rales, wheezes   Cor- regular rate and rhythm, no murmur , gallop   Abdomen- soft, non-tender   Extremities - warm, non-tender, minimal edema   Neuro- oriented, appropriate, no focal weakness  Diagnostic Tests:  I reviewed the echo that she had in late May showing EF 40%   impression: Two-vessel coronary artery disease Mild-moderate LV dysfunction Stable exertional angina on medical therapy.  Patient's remains very anxious about discussing heart bypass surgery and understandably wants to be sure she absolutely needs it.  She will return for further discussion of her angina and coronary disease next month.  She will let us know if jaw pain changes in intensity or frequency.      Plan:    Jeanette Byes, MD [facility] [phone]

## 2021-09-24 ENCOUNTER — Encounter (HOSPITAL_COMMUNITY): Payer: Medicare Other

## 2021-09-26 ENCOUNTER — Ambulatory Visit (INDEPENDENT_AMBULATORY_CARE_PROVIDER_SITE_OTHER): Payer: Medicare Other

## 2021-09-26 ENCOUNTER — Ambulatory Visit: Payer: Medicare Other | Admitting: Orthopaedic Surgery

## 2021-09-26 DIAGNOSIS — M654 Radial styloid tenosynovitis [de Quervain]: Secondary | ICD-10-CM | POA: Insufficient documentation

## 2021-09-26 DIAGNOSIS — M25531 Pain in right wrist: Secondary | ICD-10-CM

## 2021-09-26 MED ORDER — BUPIVACAINE HCL 0.25 % IJ SOLN
1.0000 mL | INTRAMUSCULAR | Status: AC | PRN
  Administered 2021-09-26: 1 mL

## 2021-09-26 MED ORDER — METHYLPREDNISOLONE ACETATE 40 MG/ML IJ SUSP
40.0000 mg | INTRAMUSCULAR | Status: AC | PRN
  Administered 2021-09-26: 40 mg

## 2021-09-26 MED ORDER — LIDOCAINE HCL 1 % IJ SOLN
1.0000 mL | INTRAMUSCULAR | Status: AC | PRN
  Administered 2021-09-26: 1 mL

## 2021-09-30 ENCOUNTER — Encounter: Payer: Self-pay | Admitting: Family Medicine

## 2021-09-30 ENCOUNTER — Other Ambulatory Visit: Payer: Self-pay | Admitting: Registered Nurse

## 2021-09-30 MED ORDER — GABAPENTIN 600 MG PO TABS: ORAL_TABLET | ORAL | 3 refills | Status: DC

## 2021-10-01 ENCOUNTER — Ambulatory Visit (HOSPITAL_COMMUNITY)
Admission: RE | Admit: 2021-10-01 | Discharge: 2021-10-01 | Disposition: A | Discharge status: Home / Self Care | Payer: Medicare Other | Source: Ambulatory Visit | Attending: Cardiology | Admitting: Cardiology

## 2021-10-01 ENCOUNTER — Encounter (HOSPITAL_COMMUNITY): Payer: Self-pay

## 2021-10-01 ENCOUNTER — Telehealth (HOSPITAL_COMMUNITY): Payer: Self-pay | Admitting: *Deleted

## 2021-10-01 VITALS — BP 142/88 | HR 78 | Wt 174.0 lb

## 2021-10-01 DIAGNOSIS — Z7984 Long term (current) use of oral hypoglycemic drugs: Secondary | ICD-10-CM | POA: Insufficient documentation

## 2021-10-01 DIAGNOSIS — Z7982 Long term (current) use of aspirin: Secondary | ICD-10-CM | POA: Insufficient documentation

## 2021-10-01 DIAGNOSIS — I110 Hypertensive heart disease with heart failure: Secondary | ICD-10-CM | POA: Insufficient documentation

## 2021-10-01 DIAGNOSIS — I2510 Atherosclerotic heart disease of native coronary artery without angina pectoris: Secondary | ICD-10-CM

## 2021-10-01 DIAGNOSIS — Z79899 Other long term (current) drug therapy: Secondary | ICD-10-CM | POA: Insufficient documentation

## 2021-10-01 DIAGNOSIS — I358 Other nonrheumatic aortic valve disorders: Secondary | ICD-10-CM | POA: Insufficient documentation

## 2021-10-01 DIAGNOSIS — I255 Ischemic cardiomyopathy: Secondary | ICD-10-CM

## 2021-10-01 DIAGNOSIS — E119 Type 2 diabetes mellitus without complications: Secondary | ICD-10-CM | POA: Diagnosis not present

## 2021-10-01 DIAGNOSIS — I493 Ventricular premature depolarization: Secondary | ICD-10-CM | POA: Diagnosis not present

## 2021-10-01 DIAGNOSIS — E785 Hyperlipidemia, unspecified: Secondary | ICD-10-CM | POA: Diagnosis not present

## 2021-10-01 DIAGNOSIS — F419 Anxiety disorder, unspecified: Secondary | ICD-10-CM | POA: Diagnosis not present

## 2021-10-01 DIAGNOSIS — I5022 Chronic systolic (congestive) heart failure: Secondary | ICD-10-CM | POA: Diagnosis not present

## 2021-10-01 LAB — BASIC METABOLIC PANEL
Anion gap: 9 (ref 5–15)
BUN: 22 mg/dL (ref 8–23)
CO2: 27 mmol/L (ref 22–32)
Calcium: 9.7 mg/dL (ref 8.9–10.3)
Chloride: 102 mmol/L (ref 98–111)
Creatinine, Ser: 1.02 mg/dL — ABNORMAL HIGH (ref 0.44–1.00)
GFR, Estimated: 60 mL/min — ABNORMAL LOW (ref >60)
Glucose, Bld: 118 mg/dL — ABNORMAL HIGH (ref 70–99)
Potassium: 4.6 mmol/L (ref 3.5–5.1)
Sodium: 138 mmol/L (ref 135–145)

## 2021-10-01 LAB — BRAIN NATRIURETIC PEPTIDE: B Natriuretic Peptide: 154.7 pg/mL — ABNORMAL HIGH (ref 0.0–100.0)

## 2021-10-01 MED ORDER — HYDROCHLOROTHIAZIDE 25 MG PO TABS: 12.5000 mg | ORAL_TABLET | Freq: Every day | ORAL | 0 refills | Status: DC

## 2021-10-01 MED ORDER — ENTRESTO 24-26 MG PO TABS: 1.0000 | ORAL_TABLET | Freq: Two times a day (BID) | ORAL | 3 refills | Status: DC

## 2021-10-02 ENCOUNTER — Encounter (HOSPITAL_COMMUNITY): Payer: Medicare Other

## 2021-10-04 ENCOUNTER — Other Ambulatory Visit (HOSPITAL_COMMUNITY): Payer: Self-pay | Admitting: *Deleted

## 2021-10-04 ENCOUNTER — Telehealth (HOSPITAL_COMMUNITY): Payer: Self-pay | Admitting: Pharmacy Technician

## 2021-10-04 ENCOUNTER — Other Ambulatory Visit (HOSPITAL_COMMUNITY): Payer: Self-pay

## 2021-10-04 DIAGNOSIS — E1149 Type 2 diabetes mellitus with other diabetic neurological complication: Secondary | ICD-10-CM

## 2021-10-04 DIAGNOSIS — I5022 Chronic systolic (congestive) heart failure: Secondary | ICD-10-CM

## 2021-10-04 MED ORDER — JARDIANCE 10 MG PO TABS: 10.0000 mg | ORAL_TABLET | Freq: Every day | ORAL | 3 refills | Status: DC

## 2021-10-04 NOTE — Telephone Encounter (Signed)
Advanced Heart Failure Patient Advocate Encounter  The patient was approved for a [employer] grant that will help cover the cost of Entresto, Jardiance. Total amount awarded, $10,000. Eligibility, 09/04/21 - 09/04/22.  ID [id number]  BIN [id number]  PCN [id number]  Group [id number]  Sent grant information to the patient via email.  Charlann Boxer, CPhT

## 2021-10-09 ENCOUNTER — Other Ambulatory Visit (HOSPITAL_COMMUNITY)
Admission: RE | Admit: 2021-10-09 | Discharge: 2021-10-09 | Disposition: A | Discharge status: Home / Self Care | Payer: Medicare Other | Source: Ambulatory Visit | Attending: Cardiology | Admitting: Cardiology

## 2021-10-09 DIAGNOSIS — Z1283 Encounter for screening for malignant neoplasm of skin: Secondary | ICD-10-CM | POA: Diagnosis not present

## 2021-10-09 DIAGNOSIS — L570 Actinic keratosis: Secondary | ICD-10-CM | POA: Diagnosis not present

## 2021-10-09 DIAGNOSIS — I5022 Chronic systolic (congestive) heart failure: Secondary | ICD-10-CM | POA: Diagnosis not present

## 2021-10-09 DIAGNOSIS — D239 Other benign neoplasm of skin, unspecified: Secondary | ICD-10-CM | POA: Diagnosis not present

## 2021-10-09 LAB — BASIC METABOLIC PANEL
Anion gap: 7 (ref 5–15)
BUN: 24 mg/dL — ABNORMAL HIGH (ref 8–23)
CO2: 26 mmol/L (ref 22–32)
Calcium: 9.1 mg/dL (ref 8.9–10.3)
Chloride: 106 mmol/L (ref 98–111)
Creatinine, Ser: 1.28 mg/dL — ABNORMAL HIGH (ref 0.44–1.00)
GFR, Estimated: 45 mL/min — ABNORMAL LOW (ref >60)
Glucose, Bld: 110 mg/dL — ABNORMAL HIGH (ref 70–99)
Potassium: 4.4 mmol/L (ref 3.5–5.1)
Sodium: 139 mmol/L (ref 135–145)

## 2021-10-10 DIAGNOSIS — H353132 Nonexudative age-related macular degeneration, bilateral, intermediate dry stage: Secondary | ICD-10-CM | POA: Diagnosis not present

## 2021-10-10 DIAGNOSIS — H524 Presbyopia: Secondary | ICD-10-CM | POA: Diagnosis not present

## 2021-10-11 ENCOUNTER — Telehealth (HOSPITAL_COMMUNITY): Payer: Self-pay | Admitting: Cardiology

## 2021-10-11 ENCOUNTER — Encounter: Payer: Medicare Other | Admitting: Registered Nurse

## 2021-10-11 ENCOUNTER — Telehealth: Payer: Self-pay | Admitting: Registered Nurse

## 2021-10-11 MED ORDER — FUROSEMIDE 20 MG PO TABS: 20.0000 mg | ORAL_TABLET | ORAL | 3 refills | Status: DC | PRN

## 2021-10-11 MED ORDER — HYDROCODONE-ACETAMINOPHEN 7.5-325 MG PO TABS: 1.0000 | ORAL_TABLET | Freq: Four times a day (QID) | ORAL | 0 refills | Status: DC | PRN

## 2021-10-11 NOTE — Telephone Encounter (Signed)
Please send in medication 

## 2021-10-11 NOTE — Telephone Encounter (Signed)
[name] appointment was re-scheduled due to Providers schedule.  PMP was Reviewed.  Hydrocodone e-scribed to Pharmacy  [name] is aware of the above and verbalizes understanding.

## 2021-10-11 NOTE — Telephone Encounter (Signed)
-----   Message from Consuelo Pandy, [place] sent at 10/09/2021  3:03 PM EDT ----- Labs ok w/ Delene Loll addition. Given slight bump in renal function, recommend that she stop daily HCTZ. Can send in Rx for PRN Lasix 20 mg to only take as needed if she starts to develop leg edema or sudden weight gain.

## 2021-10-11 NOTE — Telephone Encounter (Signed)
Patient called.  Patient aware.  

## 2021-10-14 ENCOUNTER — Telehealth: Payer: Self-pay | Admitting: Internal Medicine

## 2021-10-14 ENCOUNTER — Telehealth: Payer: Self-pay

## 2021-10-14 NOTE — Telephone Encounter (Signed)
Pt returning nurse's call. Please advise

## 2021-10-14 NOTE — Telephone Encounter (Signed)
Returned call appointment scheduled with Dr.Jordan 7/14 at 9:40 am to discuss PCI options.

## 2021-10-14 NOTE — Telephone Encounter (Signed)
Called patient left message on personal voice mail to call me back to schedule appointment with Dr.Jordan.

## 2021-10-15 NOTE — Progress Notes (Signed)
Cardiology Office Note   Date:  10/18/2021   ID:  [name], DOB Oct 25, 1950, MRN [id number]  PCP:  Martinique, Betty G, MD  Cardiologist:  Dorris Carnes MD  Chief Complaint  Patient presents with   Coronary Artery Disease      History of Present Illness: [name] is a 71 y.o. female who is seen for management of her CAD and to discuss possible PCI options for treatment. She was initially seen in June 2022 by Dr [name] for palpitations and pre op clearance for back surgery.  Cardiac monitor showed 24% PVC burden. Echo EF 55-60%, RV normal.    11/22 she was ordered to get NST. This showed medium to large defects in the apical anterior/anterior septal distribution as well as basal inferior lateral distribution which was suggestive of scar and mild peri-infarct ischemia. She was subsequently referred for [facility] 12/22 which showed severe obstructive CAD with subtotal 99% mid-LAD and complex dominant LCx disease with severe stenoses involving the bifurication of 4 branches with CT surgery consult recommended as LCx disease was not amenable to PCI. Repeat echo showed her EF was mildly reduced at 45-50% with mild LVH, normal RV function and trivial MR. She did meet with Dr. Kipp Brood on 03/22/2021 and was very hesitant about proceeding with surgical intervention and was informed to make them aware with her ultimate decision about proceeding with CABG.    She went for a 2nd opinion and saw Dr. Prescott Gum 4/23 who also recommended CABG, but pt remained hesitant.    Presented to ED 08/22/21 w/ CC, nausea/vomiting and diarrhea. Notes outlined that she had CP and dyspnea but pt denies this. She was found to have an AKI and diagnosed w/ colitis. Also treated for CAP. HsTn 86>>80. Level not consistent with ACS. Felt most likely demand ischemia in the setting of dehydration (vomiting/diarrhea), AKI, acute infection. Echo repeated EF 40-45% w septal apical and inferior apical hypokinesis.    Saw Dr.  Prescott Gum 6/22. Still very anxious about surgery and uncertain if she wanted to proceed.   Now seen to discuss possible PCI approach. She still states she has no chest pain or dyspnea. Notes she does fatigue more easily. Has some palpitations at night.        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[place])    Anxiety    Arthritis    Diabetes mellitus without complication (HCC)    GERD (gastroesophageal reflux disease)    Heart murmur    Hyperlipidemia    Hypertension    Neuromuscular disorder (HCC)    Palpitations    Pneumonia    PONV (postoperative nausea and vomiting)    PTSD (post-traumatic stress disorder)    Ringing of ears, left     Past Surgical History:  Procedure Laterality Date   ABDOMINAL HYSTERECTOMY     partial   ACHILLES TENDON SURGERY Left    APPENDECTOMY     BACK SURGERY     BLADDER SUSPENSION     BREAST SURGERY Right 2003   milk duct removed   CHOLECYSTECTOMY     FRACTURE SURGERY     collar bone   LEFT HEART CATH AND CORONARY ANGIOGRAPHY N/A 03/08/2021   Procedure: LEFT HEART CATH AND CORONARY ANGIOGRAPHY;  Surgeon: Martinique, Dalinda Heidt M, MD;  Location: [facility];  Service: Cardiovascular;  Laterality: N/A;   NOSE SURGERY     x 2    OTHER SURGICAL  HISTORY     Breast Duct removed   PARTIAL HYSTERECTOMY     RECTOCELE REPAIR     TONSILLECTOMY     VASCULAR SURGERY Right    right leg   VEIN SURGERY Right    leg     Current Outpatient Medications  Medication Sig Dispense Refill   albuterol (VENTOLIN HFA) 108 (90 Base) MCG/ACT inhaler INHALE 2 PUFFS INTO THE LUNGS EVERY 6 HOURS AS NEEDED FOR WHEEZING OR SHORTNESS OF BREATH 18 g 2   Alcohol Swabs (B-D SINGLE USE SWABS REGULAR) PADS Use to test blood sugar 3 times daily 100 each 3   aspirin EC 81 MG tablet Take 81 mg by mouth daily. Swallow whole.     Blood Glucose Calibration (ACCU-CHEK AVIVA) SOLN Use as directed 1 each 2   Blood Glucose Monitoring Suppl (ONETOUCH VERIO FLEX  SYSTEM) w/Device KIT Use to test blood sugars 1-2 times daily. 1 kit 0   clonazePAM (KLONOPIN) 1 MG tablet TAKE 1 TABLET(1 MG) BY MOUTH TWICE DAILY AS NEEDED FOR ANXIETY 60 tablet 3   Dulaglutide (TRULICITY) 1.5 YT/0.3TW SOPN Inject 1.5 mg into the skin once a week. 13 mL 3   ezetimibe (ZETIA) 10 MG tablet TAKE 1 TABLET(10 MG) BY MOUTH DAILY 90 tablet 2   fluticasone (FLONASE) 50 MCG/ACT nasal spray Place 2 sprays into both nostrils daily as needed for allergies or rhinitis. 16 g 3   furosemide (LASIX) 20 MG tablet Take 1 tablet (20 mg total) by mouth as needed. 90 tablet 3   gabapentin (NEURONTIN) 600 MG tablet TAKE 1 TABLET BY MOUTH EVERY MORNING AND AT NOON AND EVERY EVENING AND AT BEDTIME 120 tablet 3   glucose blood (ONETOUCH VERIO) test strip Use to test blood sugar 1-2 times daily. 100 each 12   HYDROcodone-acetaminophen (NORCO) 7.5-325 MG tablet Take 1 tablet by mouth every 6 (six) hours as needed for moderate pain. 120 tablet 0   JARDIANCE 10 MG TABS tablet Take 1 tablet (10 mg total) by mouth daily. 90 tablet 3   Lancets (ONETOUCH ULTRASOFT) lancets Use to test blood sugar 1-2 times daily. 100 each 12   Magnesium 250 MG TABS Take 250 mg by mouth daily.     metoprolol succinate (TOPROL XL) 25 MG 24 hr tablet Take 0.5 tablets (12.5 mg total) by mouth at bedtime. 45 tablet 3   omeprazole (PRILOSEC) 40 MG capsule Take 1 capsule (40 mg total) by mouth daily. 90 capsule 2   ondansetron (ZOFRAN-ODT) 4 MG disintegrating tablet Take 1 tablet (4 mg total) by mouth daily as needed. 20 tablet 2   potassium chloride SA (KLOR-CON M) 20 MEQ tablet TAKE 1 TABLET BY MOUTH EVERY DAY 30 tablet 3   QUEtiapine (SEROQUEL) 100 MG tablet Take 0.5 tablets (50 mg total) by mouth at bedtime. 45 tablet 3   sacubitril-valsartan (ENTRESTO) 24-26 MG Take 1 tablet by mouth 2 (two) times daily. 60 tablet 3   triamcinolone (KENALOG) 0.1 % paste Use as directed 1 application. in the mouth or throat 2 (two) times daily. 5 g  1   No current facility-administered medications for this visit.    Allergies:   Hydromorphone, Morphine and related, Other, and Penicillins    Social History:  The patient  reports that she has never smoked. She has never used smokeless tobacco. She reports that she does not drink alcohol and does not use drugs.   Family History:  The patient's family history includes Alzheimer's disease in  her mother; Diabetes in her mother; Esophageal cancer in her brother; Lung cancer in her father.    ROS:  Please see the history of present illness.   Otherwise, review of systems are positive for none.   All other systems are reviewed and negative.    PHYSICAL EXAM: VS:  BP (!) 170/70 (BP Location: Left Arm, Patient Position: Sitting, Cuff Size: Normal)   Pulse (!) 41   Resp 20   Ht _0  (1.727 m)   Wt 181 lb 9.6 oz (82.4 kg)   SpO2 100%   BMI 27.61 kg/m  , BMI Body mass index is 27.61 kg/m. GEN: Well nourished, well developed, in no acute distress HEENT: normal Neck: no JVD, carotid bruits, or masses Cardiac: RRR; no murmurs, rubs, or gallops,no edema  Respiratory:  clear to auscultation bilaterally, normal work of breathing GI: soft, nontender, nondistended, + BS MS: no deformity or atrophy Skin: warm and dry, no rash Neuro:  Strength and sensation are intact Psych: euthymic mood, full affect   EKG:  EKG is not ordered today. The ekg ordered today demonstrates N/a   Recent Labs: 01/28/2021: TSH 0.49 08/24/2021: Hemoglobin 12.5; Platelets 206 09/04/2021: ALT 21; Magnesium 1.9 10/01/2021: B Natriuretic Peptide 154.7 10/09/2021: BUN 24; Creatinine, Ser 1.28; Potassium 4.4; Sodium 139    Lipid Panel    Component Value Date/Time   CHOL 187 08/24/2021 0205   CHOL 183 12/16/2016 1517   TRIG 223 (H) 08/24/2021 0205   HDL 27 (L) 08/24/2021 0205   HDL 53 12/16/2016 1517   CHOLHDL 6.9 08/24/2021 0205   VLDL 45 (H) 08/24/2021 0205   LDLCALC 115 (H) 08/24/2021 0205   LDLCALC 84  12/16/2016 1517   LDLDIRECT 140.0 01/28/2021 1007      Wt Readings from Last 3 Encounters:  10/18/21 181 lb 9.6 oz (82.4 kg)  10/01/21 174 lb (78.9 kg)  09/23/21 179 lb (81.2 kg)      Other studies Reviewed: Additional studies/ records that were reviewed today include:   2D Echo 08/22/21 Septal apical and inferior apical hypokinesis . Left ventricular ejection fraction, by estimation, is 40 to 45%. The left ventricle has mildly decreased function. The left ventricle demonstrates regional wall motion abnormalities (see scoring diagram/findings for description). The left ventricular internal cavity size was mildly dilated. There is mild left ventricular hypertrophy. Left ventricular diastolic parameters were normal. 1. 2. Right ventricular systolic function is normal. The right ventricular size is normal. 3. Left atrial size was moderately dilated. The mitral valve is abnormal. Mild mitral valve regurgitation. No evidence of mitral stenosis. 4. The aortic valve is tricuspid. There is moderate calcification of the aortic valve. There is moderate thickening of the aortic valve. Aortic valve regurgitation is not visualized. Mild aortic valve stenosis. 5. The inferior vena cava is normal in size with greater than 50% respiratory variability, suggesting right atrial pressure of 3 mmHg.   LHC: Dec 2022   Ost LAD to Prox LAD lesion is 40% stenosed.   Mid LAD lesion is 99% stenosed.   1st Diag lesion is 90% stenosed.   1st Mrg lesion is 80% stenosed.   2nd Mrg lesion is 90% stenosed.   Mid Cx lesion is 80% stenosed.   LV end diastolic pressure is normal.   There is no aortic valve stenosis.   Severe obstructive CAD with left dominant circulation Subtotal mid LAD stenosis Complex LCx disease with severe stenoses involving bifurcation of 4 branches of the LCx. Normal LVEDP  Diagnostic Dominance: Left      Plan: needs referral to CT surgery for CABG. The LCx disease is not  amenable to PCI.    ECHO: 03/08/21 IMPRESSIONS     1. Left ventricular ejection fraction, by estimation, is 45 to 50%. The  left ventricle has mildly decreased function. The left ventricle  demonstrates regional wall motion abnormalities (see scoring  diagram/findings for description). There is mild left  ventricular hypertrophy. Left ventricular diastolic parameters are  indeterminate.   2. Right ventricular systolic function is normal. The right ventricular  size is normal. There is normal pulmonary artery systolic pressure. The  estimated right ventricular systolic pressure is 03.5 mmHg.   3. Left atrial size was mildly dilated.   4. The mitral valve is normal in structure. Trivial mitral valve  regurgitation.   5. The aortic valve is tricuspid. Aortic valve regurgitation is not  visualized. Aortic valve sclerosis/calcification is present, without any  evidence of aortic stenosis.   6. The inferior vena cava is dilated in size with >50% respiratory  variability, suggesting right atrial pressure of 8 mmHg.   ASSESSMENT AND PLAN:  1.  CAD. Patient has severe CAD in left dominant circulation. Subtotal mid LAD. Complex disease in dominant LCx at [place]. She has diabetes and LV dysfunction. I reviewed her angiogram with her in detail. We also discussed the clinical factors that influence decision for revascularization. We also presented her case at our recent Heart team meeting including interventionalists and surgeons. The strong consensus is that CABG is her best option for revascularization. Given her DM and LV dysfunction CABG offers a survival benefit over other options. We could perform PCI of the LAD only but this would not be complete revascularization and would not afford the same long term benefit. There is not a good PCI option for the complex LCx disease. I reviewed all this with her. She understands the rationale for our recommendation. She is still very anxious about  surgery and is quite tearful in our discussion. She appears willing to proceed with surgery at this point. She is going to keep upcoming appointments in the heart failure clinic and with Dr Prescott Gum.    Current medicines are reviewed at length with the patient today.  The patient does not have concerns regarding medicines.  The following changes have been made:  no change  Labs/ tests ordered today include:  No orders of the defined types were placed in this encounter.        Disposition:   FU with Advanced hear failure and Dr Prescott Gum as scheduled.  Signed, Mung Rinker Martinique, MD  10/18/2021 10:12 AM    [facility] [address] [place], [place], [address] Phone [phone], Fax [phone]

## 2021-10-17 ENCOUNTER — Encounter (HOSPITAL_COMMUNITY): Payer: Self-pay

## 2021-10-18 ENCOUNTER — Ambulatory Visit (INDEPENDENT_AMBULATORY_CARE_PROVIDER_SITE_OTHER): Payer: Medicare Other | Admitting: Cardiology

## 2021-10-18 ENCOUNTER — Encounter: Payer: Self-pay | Admitting: Cardiology

## 2021-10-18 VITALS — BP 170/70 | HR 41 | Resp 20 | Ht 68.0 in | Wt 181.6 lb

## 2021-10-18 DIAGNOSIS — G720 Drug-induced myopathy: Secondary | ICD-10-CM

## 2021-10-18 DIAGNOSIS — I2510 Atherosclerotic heart disease of native coronary artery without angina pectoris: Secondary | ICD-10-CM | POA: Diagnosis not present

## 2021-10-18 DIAGNOSIS — I5022 Chronic systolic (congestive) heart failure: Secondary | ICD-10-CM

## 2021-10-18 DIAGNOSIS — G729 Myopathy, unspecified: Secondary | ICD-10-CM | POA: Insufficient documentation

## 2021-10-18 DIAGNOSIS — T466X5A Adverse effect of antihyperlipidemic and antiarteriosclerotic drugs, initial encounter: Secondary | ICD-10-CM

## 2021-10-18 DIAGNOSIS — I493 Ventricular premature depolarization: Secondary | ICD-10-CM | POA: Diagnosis not present

## 2021-10-18 DIAGNOSIS — I10 Essential (primary) hypertension: Secondary | ICD-10-CM | POA: Diagnosis not present

## 2021-10-29 DIAGNOSIS — M4807 Spinal stenosis, lumbosacral region: Secondary | ICD-10-CM | POA: Diagnosis not present

## 2021-10-30 ENCOUNTER — Encounter (HOSPITAL_COMMUNITY): Payer: Medicare Other

## 2021-10-31 ENCOUNTER — Encounter: Payer: Medicare Other | Attending: Physical Medicine & Rehabilitation | Admitting: Registered Nurse

## 2021-10-31 ENCOUNTER — Encounter: Payer: Self-pay | Admitting: Registered Nurse

## 2021-10-31 VITALS — BP 131/74 | HR 80 | Ht 68.0 in | Wt 177.2 lb

## 2021-10-31 DIAGNOSIS — M542 Cervicalgia: Secondary | ICD-10-CM | POA: Insufficient documentation

## 2021-10-31 DIAGNOSIS — Z5181 Encounter for therapeutic drug level monitoring: Secondary | ICD-10-CM | POA: Insufficient documentation

## 2021-10-31 DIAGNOSIS — Z79891 Long term (current) use of opiate analgesic: Secondary | ICD-10-CM | POA: Diagnosis not present

## 2021-10-31 DIAGNOSIS — G894 Chronic pain syndrome: Secondary | ICD-10-CM | POA: Diagnosis not present

## 2021-10-31 DIAGNOSIS — M2550 Pain in unspecified joint: Secondary | ICD-10-CM | POA: Diagnosis not present

## 2021-10-31 DIAGNOSIS — M47812 Spondylosis without myelopathy or radiculopathy, cervical region: Secondary | ICD-10-CM | POA: Insufficient documentation

## 2021-10-31 DIAGNOSIS — E1142 Type 2 diabetes mellitus with diabetic polyneuropathy: Secondary | ICD-10-CM | POA: Diagnosis not present

## 2021-10-31 DIAGNOSIS — M5416 Radiculopathy, lumbar region: Secondary | ICD-10-CM | POA: Diagnosis not present

## 2021-10-31 DIAGNOSIS — M5412 Radiculopathy, cervical region: Secondary | ICD-10-CM | POA: Diagnosis not present

## 2021-10-31 MED ORDER — HYDROCODONE-ACETAMINOPHEN 10-325 MG PO TABS: 1.0000 | ORAL_TABLET | Freq: Four times a day (QID) | ORAL | 0 refills | Status: DC | PRN

## 2021-10-31 NOTE — Progress Notes (Signed)
Subjective:    Patient ID: [name], female    DOB: May 01, 1950, 71 y.o.   MRN: [id number]  HPI: [name] is a 71 y.o. female who returns for follow up appointment for chronic pain and medication refill. She states her pain is located in her neck radiating into her left shoulder and lower back pain radiating into her left lower extremity. She also reports increase intensity of Lower back radiculr pain, scheduled for an injection, at this time she is only receiving 4 hours of relief with her current medication regimen, she is very tearful.emotional support given.. We will increase her Hydrocodone to 10/325 , she verbalizes understanding. She was instructed to send a My chart message in a few week to evaluate medication change, she verbalizes understanding. She rates her pain 10. Her current exercise regime is walking and performing stretching exercises.  Ms. Todaro Morphine equivalent is 40.00 MME.   Last UDS was Performed on 08/27/2021, it was consistent.     Pain Inventory Average Pain 10 Pain Right Now 10 My pain is sharp, burning, stabbing, and aching  In the last 24 hours, has pain interfered with the following? General activity 8 Relation with others 8 Enjoyment of life 9 What TIME of day is your pain at its worst? morning , daytime, and evening Sleep (in general) Poor  Pain is worse with: bending, sitting, standing, and some activites Pain improves with: heat/ice, therapy/exercise, and medication Relief from Meds: 10  Family History  Problem Relation Age of Onset   Diabetes Mother    Alzheimer's disease Mother    Lung cancer Father    Esophageal cancer Brother    Colon cancer Neg Hx    Social History   Socioeconomic History   Marital status: Widowed    Spouse name: Not on file   Number of children: 1   Years of education: Not on file   Highest education level: High school graduate  Occupational History   Occupation: retired    Comment: work part  time  Tobacco Use   Smoking status: Never   Smokeless tobacco: Never  Vaping Use   Vaping Use: Never used  Substance and Sexual Activity   Alcohol use: Never   Drug use: No   Sexual activity: Not on file  Other Topics Concern   Not on file  Social History Narrative   Not on file   Social Determinants of Health   Financial Resource Strain: Low Risk  (08/23/2021)   Overall Financial Resource Strain (CARDIA)    Difficulty of Paying Living Expenses: Not hard at all  Food Insecurity: No Food Insecurity (08/23/2021)   Hunger Vital Sign    Worried About Running Out of Food in the Last Year: Never true    [place] in the Last Year: Never true  Transportation Needs: No Transportation Needs (08/23/2021)   PRAPARE - Hydrologist (Medical): No    Lack of Transportation (Non-Medical): No  Physical Activity: Insufficiently Active (11/06/2020)   Exercise Vital Sign    Days of Exercise per Week: 2 days    Minutes of Exercise per Session: 40 min  Stress: No Stress Concern Present (11/06/2020)   [place]    Feeling of Stress : Not at all  Social Connections: Moderately Integrated (11/06/2020)   Social Connection and Isolation Panel [NHANES]    Frequency of Communication with Friends and Family: Twice a  week    Frequency of Social Gatherings with Friends and Family: Twice a week    Attends Religious Services: More than 4 times per year    Active Member of [employer] or Organizations: Yes    Attends Archivist Meetings: 1 to 4 times per year    Marital Status: Widowed   Past Surgical History:  Procedure Laterality Date   ABDOMINAL HYSTERECTOMY     partial   ACHILLES TENDON SURGERY Left    APPENDECTOMY     BACK SURGERY     BLADDER SUSPENSION     BREAST SURGERY Right 2003   milk duct removed   CHOLECYSTECTOMY     FRACTURE SURGERY     collar bone   LEFT HEART CATH AND CORONARY  ANGIOGRAPHY N/A 03/08/2021   Procedure: LEFT HEART CATH AND CORONARY ANGIOGRAPHY;  Surgeon: Martinique, Peter M, MD;  Location: [facility];  Service: Cardiovascular;  Laterality: N/A;   NOSE SURGERY     x 2    OTHER SURGICAL HISTORY     Breast Duct removed   PARTIAL HYSTERECTOMY     RECTOCELE REPAIR     TONSILLECTOMY     VASCULAR SURGERY Right    right leg   VEIN SURGERY Right    leg   Past Surgical History:  Procedure Laterality Date   ABDOMINAL HYSTERECTOMY     partial   ACHILLES TENDON SURGERY Left    APPENDECTOMY     BACK SURGERY     BLADDER SUSPENSION     BREAST SURGERY Right 2003   milk duct removed   CHOLECYSTECTOMY     FRACTURE SURGERY     collar bone   LEFT HEART CATH AND CORONARY ANGIOGRAPHY N/A 03/08/2021   Procedure: LEFT HEART CATH AND CORONARY ANGIOGRAPHY;  Surgeon: Martinique, Peter M, MD;  Location: [facility];  Service: Cardiovascular;  Laterality: N/A;   NOSE SURGERY     x 2    OTHER SURGICAL HISTORY     Breast Duct removed   PARTIAL HYSTERECTOMY     RECTOCELE REPAIR     TONSILLECTOMY     VASCULAR SURGERY Right    right leg   VEIN SURGERY Right    leg   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HCC)    Anxiety    Arthritis    Diabetes mellitus without complication (HCC)    GERD (gastroesophageal reflux disease)    Heart murmur    Hyperlipidemia    Hypertension    Neuromuscular disorder (HCC)    Palpitations    Pneumonia    PONV (postoperative nausea and vomiting)    PTSD (post-traumatic stress disorder)    Ringing of ears, left    BP (!) 123/56   Pulse 66   Ht '5\' 8"'$  (1.727 m)   Wt 177 lb 3.2 oz (80.4 kg)   SpO2 96%   BMI 26.94 kg/m   Opioid Risk Score:   Fall Risk Score:  `1  Depression screen PHQ 2/9     10/31/2021    2:55 PM 09/04/2021    2:25 PM 06/10/2021    2:54 PM 05/14/2021    3:21 PM 04/23/2021    2:28 PM 04/11/2021    2:42 PM 12/06/2020    3:01 PM  Depression screen PHQ 2/9  Decreased Interest 0 2  0 0 1 0 1  Down, Depressed, Hopeless 0 '1 3 2 1 1 1  '$ PHQ -  2 Score 0 '3 3 2 2 1 2  '$ Altered sleeping  '1  2 1    '$ Tired, decreased energy  '1  1 1    '$ Change in appetite  '1  2 1    '$ Feeling bad or failure about yourself   0  1 0    Trouble concentrating  0  1 0    Moving slowly or fidgety/restless  0  1 0    Suicidal thoughts  0  0 0    PHQ-9 Score  '6  10 5    '$ Difficult doing work/chores  Not difficult at all  Somewhat difficult Somewhat difficult       Review of Systems  Constitutional: Negative.   HENT: Negative.    Eyes: Negative.   Respiratory: Negative.    Cardiovascular: Negative.   Gastrointestinal: Negative.   Endocrine: Negative.   Genitourinary: Negative.   Musculoskeletal:  Positive for back pain.  Skin: Negative.   Allergic/Immunologic: Negative.   Neurological: Negative.   Hematological: Negative.   Psychiatric/Behavioral:  Positive for sleep disturbance.       Objective:   Physical Exam Vitals and nursing note reviewed.  Constitutional:      Appearance: Normal appearance.  Neck:     Comments: Cervical Paraspinal Tenderness:: C-5-C-6  Cardiovascular:     Rate and Rhythm: Normal rate. Rhythm irregular.     Pulses: Normal pulses.     Heart sounds: Normal heart sounds.  Pulmonary:     Effort: Pulmonary effort is normal.     Breath sounds: Normal breath sounds.  Musculoskeletal:     Cervical back: Normal range of motion and neck supple.     Comments: Normal Muscle Bulk and Muscle Testing Reveals:  Upper Extremities: Right: Full ROM and Muscle Strength 5/5 Left Upper Extremity: Decreased ROM 90 Degrees and Muscle Strength 4/5 Left AC Joint Tenderness Lumbar Hypersensitivity Left Greater trochanter Tenderness Lower Extremities: Full ROM and Muscle Strength 5/5 Arises from Table slowly Antalgic  Gait     Skin:    General: Skin is warm and dry.  Neurological:     Mental Status: She is alert and oriented to person, place, and time.  Psychiatric:        Mood  and Affect: Mood normal.        Behavior: Behavior normal.         Assessment & Plan:  1.Chronic cervicalgia with documented spondylosis on MRI, per Dr. Naaman Plummer Note and Cervical Radiculitis: Continue Gabapentin,  Continue HEP as Tolerated. 11/05/2021 2. Left shoulder pain most consistent with left rotator cuff syndrome. Mild DJD.  Continue HEP as tolerated.Continue to monitor. 11/05/2021. Refilled: Increased: Hydrocodone 10/325 mg one tablet every 6 hours as needed for pain. #120. Second script sent for the following month. We will continue the opioid monitoring program, this consists of regular clinic visits, examinations, urine drug screen, pill counts as well as use of [place] Controlled Substance Reporting system. A 12 month History has been reviewed on the [facility] on 11/05/2021. 3. Left wrist/finger pain most consistent with OA, ?post traumatic. No complaints today. 11/05/2021 4. Anxiety: Continue Klonopin, PCP prescribing. Continue to monitor. 11/05/2021 5. Restless Leg syndrome: Continue Gabapentin. 11/05/2021 7. Sacroiliac inflammation: S/P Right  SI injection on 09/20/2019, with No relief noted. Ms. Pulice seen Dr Marlou Sa in regards to her pain. Dr Saintclair Halsted. Following.  Continue to Monitor. 11/05/2021. 8. Myofascial Pain: Continue Robaxin as needed. 11/05/2021.  9. Bilateral feet  with neuropathic pain: Increase Gabapentin 600 mg 4 times a day. Continue to monitor. 11/05/2021. 10.  Herniated Nucleus Pulposus L2-3 with recurrent disc rupture: S/P  Posterior Lumbar Interbody Fusion  - Lumbar two-Lumbar three on 01/13/2020 by Dr Saintclair Halsted.  Dr Saintclair Halsted Following. 11/05/2021   F/U in 2 months

## 2021-10-31 NOTE — Patient Instructions (Signed)
Send a My- Chart Message with medication update: Third week in August

## 2021-11-01 ENCOUNTER — Encounter (HOSPITAL_COMMUNITY): Payer: Self-pay

## 2021-11-01 ENCOUNTER — Other Ambulatory Visit (HOSPITAL_COMMUNITY): Payer: Self-pay | Admitting: Cardiology

## 2021-11-01 ENCOUNTER — Ambulatory Visit (HOSPITAL_COMMUNITY)
Admission: RE | Admit: 2021-11-01 | Discharge: 2021-11-01 | Disposition: A | Discharge status: Home / Self Care | Payer: Medicare Other | Source: Ambulatory Visit | Attending: Family Medicine | Admitting: Family Medicine

## 2021-11-01 ENCOUNTER — Ambulatory Visit (HOSPITAL_COMMUNITY)
Admission: RE | Admit: 2021-11-01 | Discharge: 2021-11-01 | Disposition: A | Discharge status: Home / Self Care | Payer: Medicare Other | Source: Ambulatory Visit | Attending: Cardiology | Admitting: Cardiology

## 2021-11-01 VITALS — BP 150/72 | HR 94 | Ht 68.0 in | Wt 177.4 lb

## 2021-11-01 DIAGNOSIS — I493 Ventricular premature depolarization: Secondary | ICD-10-CM | POA: Insufficient documentation

## 2021-11-01 DIAGNOSIS — I5022 Chronic systolic (congestive) heart failure: Secondary | ICD-10-CM | POA: Diagnosis not present

## 2021-11-01 DIAGNOSIS — I2510 Atherosclerotic heart disease of native coronary artery without angina pectoris: Secondary | ICD-10-CM | POA: Diagnosis not present

## 2021-11-01 DIAGNOSIS — E785 Hyperlipidemia, unspecified: Secondary | ICD-10-CM | POA: Diagnosis not present

## 2021-11-01 DIAGNOSIS — Z602 Problems related to living alone: Secondary | ICD-10-CM | POA: Diagnosis not present

## 2021-11-01 DIAGNOSIS — Z7982 Long term (current) use of aspirin: Secondary | ICD-10-CM | POA: Diagnosis not present

## 2021-11-01 DIAGNOSIS — I110 Hypertensive heart disease with heart failure: Secondary | ICD-10-CM | POA: Insufficient documentation

## 2021-11-01 DIAGNOSIS — Z79899 Other long term (current) drug therapy: Secondary | ICD-10-CM | POA: Diagnosis not present

## 2021-11-01 DIAGNOSIS — I255 Ischemic cardiomyopathy: Secondary | ICD-10-CM | POA: Diagnosis not present

## 2021-11-01 DIAGNOSIS — F419 Anxiety disorder, unspecified: Secondary | ICD-10-CM | POA: Diagnosis not present

## 2021-11-01 DIAGNOSIS — R002 Palpitations: Secondary | ICD-10-CM | POA: Diagnosis not present

## 2021-11-01 DIAGNOSIS — Z7984 Long term (current) use of oral hypoglycemic drugs: Secondary | ICD-10-CM | POA: Insufficient documentation

## 2021-11-01 LAB — CBC
HCT: 38 % (ref 36.0–46.0)
Hemoglobin: 12.8 g/dL (ref 12.0–15.0)
MCH: 31.7 pg (ref 26.0–34.0)
MCHC: 33.7 g/dL (ref 30.0–36.0)
MCV: 94.1 fL (ref 80.0–100.0)
Platelets: 226 10*3/uL (ref 150–400)
RBC: 4.04 MIL/uL (ref 3.87–5.11)
RDW: 11.8 % (ref 11.5–15.5)
WBC: 4.5 10*3/uL (ref 4.0–10.5)
nRBC: 0 % (ref 0.0–0.2)

## 2021-11-01 LAB — BASIC METABOLIC PANEL
Anion gap: 9 (ref 5–15)
BUN: 21 mg/dL (ref 8–23)
CO2: 27 mmol/L (ref 22–32)
Calcium: 9.5 mg/dL (ref 8.9–10.3)
Chloride: 103 mmol/L (ref 98–111)
Creatinine, Ser: 1.28 mg/dL — ABNORMAL HIGH (ref 0.44–1.00)
GFR, Estimated: 45 mL/min — ABNORMAL LOW (ref >60)
Glucose, Bld: 165 mg/dL — ABNORMAL HIGH (ref 70–99)
Potassium: 4 mmol/L (ref 3.5–5.1)
Sodium: 139 mmol/L (ref 135–145)

## 2021-11-01 LAB — TSH: TSH: 0.481 u[IU]/mL (ref 0.350–4.500)

## 2021-11-01 MED ORDER — LOSARTAN POTASSIUM 25 MG PO TABS: 12.5000 mg | ORAL_TABLET | Freq: Every day | ORAL | 3 refills | Status: DC

## 2021-11-01 MED ORDER — METOPROLOL SUCCINATE ER 25 MG PO TB24: 25.0000 mg | ORAL_TABLET | Freq: Every day | ORAL | 3 refills | Status: DC

## 2021-11-01 NOTE — Patient Instructions (Addendum)
Labs done today. We will contact you only if your labs are abnormal.  START Losartan 12.'5mg'$  (1/2 tablet) by mouth daily at betime  INCREASE Metoprolol to '25mg'$  (1 tablet) by mouth daily.   No other medication changes were made. Please continue all current medications as prescribed.  Your provider has recommended that  you wear a Zio Patch for 14 days.  This monitor will record your heart rhythm for our review.  IF you have any symptoms while wearing the monitor please press the button.  If you have any issues with the patch or you notice a red or orange light on it please call the company at [phone].  Once you remove the patch please mail it back to the company as soon as possible so we can get the results.  Your physician recommends that you schedule a follow-up appointment in: 4-6 weeks with our NP/PA Clinic here in our office and in 12 weeks with Dr. Aundra Dubin.   If you have any questions or concerns before your next appointment please send [place] a message through [place] or call our office at [phone].    TO LEAVE A MESSAGE FOR THE NURSE SELECT OPTION 2, PLEASE LEAVE A MESSAGE INCLUDING: YOUR NAME DATE OF BIRTH CALL BACK NUMBER REASON FOR CALL**this is important as we prioritize the call backs  YOU WILL RECEIVE A CALL BACK THE SAME DAY AS LONG AS YOU CALL BEFORE 4:00 PM   Do the following things EVERYDAY: Weigh yourself in the morning before breakfast. Write it down and keep it in a log. Take your medicines as prescribed Eat low salt foods--Limit salt (sodium) to 2000 mg per day.  Stay as active as you can everyday Limit all fluids for the day to less than 2 liters   At the [facility], you and your health needs are our priority. As part of our continuing mission to provide you with exceptional heart care, we have created designated Provider Care Teams. These Care Teams include your primary Cardiologist (physician) and Advanced Practice Providers (APPs-  Physician Assistants and Nurse Practitioners) who all work together to provide you with the care you need, when you need it.   You may see any of the following providers on your designated Care Team at your next follow up: Dr Glori Bickers Dr Haynes Kerns, NP Lyda Jester, [place] Audry Riles, PharmD   Please be sure to bring in all your medications bottles to every appointment.

## 2021-11-01 NOTE — Progress Notes (Signed)
Zio patch placed onto patient.  All instructions and information reviewed with patient, they verbalize understanding with no questions. 

## 2021-11-01 NOTE — Progress Notes (Signed)
ADVANCED HF CLINIC CONSULT NOTE  Primary Care: Martinique, Betty G, MD CVTS: Dr. Prescott Gum HF Cardiologist: Dr. Aundra Dubin  HPI: [name] is a 71 y.o. female w/ chronic systolic heart failure, CAD, PVCs, type 2DM, HTN and HLD.    Referred for evaluation for palpitations 6/22. Cardiac monitor showed 24% PVC burden. Echo EF 55-60%, RV normal.    11/22 she was ordered to get NST prior to surgical clearance for back surgery. This showed medium to large defects in the apical anterior/anterior septal distribution as well as basal inferior lateral distribution which was suggestive of scar and mild peri-infarct ischemia. She was subsequently referred for [facility] 12/22 which showed severe obstructive CAD with subtotal 99% mid-LAD and complex LCx disease with severe stenoses involving the bifurication of 4 branches with CT surgery consult recommended as LCx disease was not amenable to PCI. Repeat echo showed her EF was mildly reduced at 45-50% with mild LVH, normal RV function and trivial MR. She did meet with Dr. Kipp Brood on 03/22/2021 and by review of notes was very hesitant about proceeding with surgical intervention and was informed to make them aware with her ultimate decision about proceeding with CABG.    She went for a 2nd opinion and saw Dr. Prescott Gum 4/23 who also recommended CABG, but pt remained hesitant.    Presented to ED 08/22/21 w/ CC, nausea/vomiting and diarrhea. Notes outlined that she had CP and dyspnea but pt denies this. She was found to have an AKI and diagnosed w/ colitis. Also treated for CAP. HsTn 86>>80. Level not consistent with ACS. Felt most likely demand ischemia in the setting of dehydration (vomiting/diarrhea), AKI, acute infection. Echo repeated EF 40-45% w septal apical and inferior apical hypokinesis.    Instructed to f/u again w/ CT surgery to re discuss CABG. Referred to [facility] clinic.    Saw Dr. Prescott Gum 6/23, remained anxious about surgery and uncertain if she wanted to  proceed.    Seen in [facility] 6/23, NYHA II and volume stable. Entresto 24/26 started. She later stopped this due to fatigue and dizziness.  Follow up with Dr. Martinique 7/23, again it was recommended that her best option is CABG.  Today she returns for HF follow up, referred from [facility]. Overall feeling fine. Main complaint is anxiety over recommendation of CABG. She is not SOB with walking on flat ground. Feels frequent palpitations. Denies CP, dizziness, edema, or PND/Orthopnea. Appetite ok. No fever or chills. She does not weigh at home. Taking all medications. Long history of anxiety and PTSD, after her son was murdered several years ago. Lives alone and planning to go to [facility] afterward CABG for rehab.   ECG (personally reviewed): NSR with frequent PVCs 90 bpm  Labs (7/23): K 4.4, creatinine 1.28   Cardiac Testing   - Echo (5/23): EF 40-45%, RV normal    - LHC (10/22):   Ost LAD to Prox LAD lesion is 40% stenosed.   Mid LAD lesion is 99% stenosed.   1st Diag lesion is 90% stenosed.   1st Mrg lesion is 80% stenosed.   2nd Mrg lesion is 90% stenosed.   Mid Cx lesion is 80% stenosed.   LV end diastolic pressure is normal.   There is no aortic valve stenosis.   Severe obstructive CAD with left dominant circulation Subtotal mid LAD stenosis Complex LCx disease with severe stenoses involving bifurcation of 4 branches of the LCx. Normal LVEDP   Plan: needs referral to CT surgery for  CABG. The LCx disease is not amenable to PCI.    - Echo (12/22): EF 45-50%, RV normal  Review of Systems: [y] = yes, [ ] = no   General: Weight gain [ ]; Weight loss [ ]; Anorexia [ ]; Fatigue [ ]; Fever [ ]; Chills [ ]; Weakness [ ]  Cardiac: Chest pain/pressure [ ]; Resting SOB [ ]; Exertional SOB [ ]; Orthopnea [ ]; Pedal Edema [ ]; Palpitations Blue.Reese ]; Syncope [ ]; Presyncope [ ]; Paroxysmal nocturnal dyspnea[ ]  Pulmonary: Cough [ ]; Wheezing[ ]; Hemoptysis[ ]; Sputum [ ]; Snoring [ ]  GI: Vomiting[ ];  Dysphagia[ ]; Melena[ ]; Hematochezia [ ]; Heartburn[ ]; Abdominal pain [ ]; Constipation [ ]; Diarrhea [ ]; BRBPR [ ]  GU: Hematuria[ ]; Dysuria [ ]; Nocturia[ ]  Vascular: Pain in legs with walking [ ]; Pain in feet with lying flat [ ]; Non-healing sores [ ]; Stroke [ ]; TIA [ ]; Slurred speech [ ];  Neuro: Headaches[ ]; Vertigo[ ]; Seizures[ ]; Paresthesias[ ];Blurred vision [ ]; Diplopia [ ]; Vision changes [ ]  Ortho/Skin: Arthritis [ ]; Joint pain [ ]; Muscle pain [ ]; Joint swelling [ ]; Back Pain [ ]; Rash [ ]  Psych: Depression[ y ]; Anxiety[ y ]  Heme: Bleeding problems [ ]; Clotting disorders [ ]; Anemia [ ]  Endocrine: Diabetes [ ]; Thyroid dysfunction[ ]  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[place])    Anxiety    Arthritis    Diabetes mellitus without complication (HCC)    GERD (gastroesophageal reflux disease)    Heart murmur    Hyperlipidemia    Hypertension    Neuromuscular disorder (HCC)    Palpitations    Pneumonia    PONV (postoperative nausea and vomiting)    PTSD (post-traumatic stress disorder)    Ringing of ears, left    Current Outpatient Medications  Medication Sig Dispense Refill   albuterol (VENTOLIN HFA) 108 (90 Base) MCG/ACT inhaler INHALE 2 PUFFS INTO THE LUNGS EVERY 6 HOURS AS NEEDED FOR WHEEZING OR SHORTNESS OF BREATH 18 g 2   Alcohol Swabs (B-D SINGLE USE SWABS REGULAR) PADS Use to test blood sugar 3 times daily 100 each 3   aspirin EC 81 MG tablet Take 81 mg by mouth daily. Swallow whole.     Blood Glucose Calibration (ACCU-CHEK AVIVA) SOLN Use as directed 1 each 2   Blood Glucose Monitoring Suppl (ONETOUCH VERIO FLEX SYSTEM) w/Device KIT Use to test blood sugars 1-2 times daily. 1 kit 0   clonazePAM (KLONOPIN) 1 MG tablet TAKE 1 TABLET(1 MG) BY MOUTH TWICE DAILY AS NEEDED FOR ANXIETY 60 tablet 3   Dulaglutide (TRULICITY) 1.5 YY/4.8GN SOPN Inject 1.5 mg into the skin once a week. 13 mL 3   ezetimibe (ZETIA) 10 MG tablet TAKE 1  TABLET(10 MG) BY MOUTH DAILY 90 tablet 2   fluticasone (FLONASE) 50 MCG/ACT nasal spray Place 2 sprays into both nostrils daily as needed for allergies or rhinitis. 16 g 3   furosemide (LASIX) 20 MG tablet Take 1 tablet (20 mg total) by mouth as needed. 90 tablet 3   gabapentin (NEURONTIN) 600 MG tablet TAKE 1 TABLET BY MOUTH EVERY MORNING AND AT NOON AND EVERY EVENING AND AT BEDTIME 120 tablet 3   glucose blood (ONETOUCH VERIO) test strip Use to test blood sugar 1-2 times daily. 100 each 12   HYDROcodone-acetaminophen (NORCO) 10-325 MG  tablet Take 1 tablet by mouth every 6 (six) hours as needed. Change in Sig: Do not fill before 11/14/2021 120 tablet 0   JARDIANCE 10 MG TABS tablet Take 1 tablet (10 mg total) by mouth daily. 90 tablet 3   Lancets (ONETOUCH ULTRASOFT) lancets Use to test blood sugar 1-2 times daily. 100 each 12   losartan (COZAAR) 25 MG tablet Take 0.5 tablets (12.5 mg total) by mouth at bedtime. 45 tablet 3   Magnesium 250 MG TABS Take 250 mg by mouth daily.     omeprazole (PRILOSEC) 40 MG capsule Take 1 capsule (40 mg total) by mouth daily. 90 capsule 2   ondansetron (ZOFRAN-ODT) 4 MG disintegrating tablet Take 1 tablet (4 mg total) by mouth daily as needed. 20 tablet 2   potassium chloride SA (KLOR-CON M) 20 MEQ tablet TAKE 1 TABLET BY MOUTH EVERY DAY 30 tablet 3   QUEtiapine (SEROQUEL) 100 MG tablet Take 0.5 tablets (50 mg total) by mouth at bedtime. 45 tablet 3   triamcinolone (KENALOG) 0.1 % paste Use as directed 1 application. in the mouth or throat 2 (two) times daily. 5 g 1   metoprolol succinate (TOPROL XL) 25 MG 24 hr tablet Take 1 tablet (25 mg total) by mouth daily. 90 tablet 3   sacubitril-valsartan (ENTRESTO) 24-26 MG Take 1 tablet by mouth 2 (two) times daily. (Patient not taking: Reported on 11/01/2021) 60 tablet 3   No current facility-administered medications for this encounter.   Allergies  Allergen Reactions   Hydromorphone Nausea And Vomiting and Other (See  Comments)    Made the patient feel "spaced out,"     Morphine And Related Itching    With high doses   Other Itching   Penicillins Rash    Has patient had a PCN reaction causing immediate rash, facial/tongue/throat swelling, SOB or lightheadedness with hypotension: Yes Has patient had a PCN reaction causing severe rash involving mucus membranes or skin necrosis: No Has patient had a PCN reaction that required hospitalization: No Has patient had a PCN reaction occurring within the last 10 years: No If all of the above answers are "NO", then may proceed with Cephalosporin use.    Social History   Socioeconomic History   Marital status: Widowed    Spouse name: Not on file   Number of children: 1   Years of education: Not on file   Highest education level: High school graduate  Occupational History   Occupation: retired    Comment: work part time  Tobacco Use   Smoking status: Never   Smokeless tobacco: Never   Tobacco comments:    Never smoke 11/01/21  Vaping Use   Vaping Use: Never used  Substance and Sexual Activity   Alcohol use: Never   Drug use: No   Sexual activity: Not on file  Other Topics Concern   Not on file  Social History Narrative   Not on file   Social Determinants of Health   Financial Resource Strain: Low Risk  (08/23/2021)   Overall Financial Resource Strain (CARDIA)    Difficulty of Paying Living Expenses: Not hard at all  Food Insecurity: No Food Insecurity (08/23/2021)   Hunger Vital Sign    Worried About Running Out of Food in the Last Year: Never true    Arial in the Last Year: Never true  Transportation Needs: No Transportation Needs (08/23/2021)   PRAPARE - Hydrologist (Medical): No  Lack of Transportation (Non-Medical): No  Physical Activity: Insufficiently Active (11/06/2020)   Exercise Vital Sign    Days of Exercise per Week: 2 days    Minutes of Exercise per Session: 40 min  Stress: No Stress  Concern Present (11/06/2020)   [place]    Feeling of Stress : Not at all  Social Connections: Moderately Integrated (11/06/2020)   Social Connection and Isolation Panel [NHANES]    Frequency of Communication with Friends and Family: Twice a week    Frequency of Social Gatherings with Friends and Family: Twice a week    Attends Religious Services: More than 4 times per year    Active Member of [employer] or Organizations: Yes    Attends Archivist Meetings: 1 to 4 times per year    Marital Status: Widowed  Intimate Partner Violence: Not At Risk (11/06/2020)   Humiliation, Afraid, Rape, and Kick questionnaire    Fear of Current or Ex-Partner: No    Emotionally Abused: No    Physically Abused: No    Sexually Abused: No   Family History  Problem Relation Age of Onset   Diabetes Mother    Alzheimer's disease Mother    Lung cancer Father    Esophageal cancer Brother    Colon cancer Neg Hx    BP (!) 150/72   Pulse 94   Ht 5' 8" (1.727 m)   Wt 80.5 kg (177 lb 6.4 oz)   SpO2 98%   BMI 26.97 kg/m   PHYSICAL EXAM: General:  NAD. No resp difficulty HEENT: Normal Neck: Supple. No JVD. Carotids 2+ bilat; no bruits. No lymphadenopathy or thryomegaly appreciated. Cor: PMI nondisplaced. UIrregular rate & rhythm. No rubs, gallops or murmurs. Lungs: Clear Abdomen: Soft, nontender, nondistended. No hepatosplenomegaly. No bruits or masses. Good bowel sounds. Extremities: No cyanosis, clubbing, rash, edema Neuro: Alert & oriented x 3, cranial nerves grossly intact. Moves all 4 extremities w/o difficulty. Tearful  ASSESSMENT & PLAN: Chronic Systolic Heart Failure/ Ischemic CM - Echo 6/22 EF 55-60%, RV normal  - Echo 12/22 EF 45-50% => LHC 2VCAD w/ high grade mLAD disease=> pt declined CABG - Echo 5/23 EF 40-45% - She has evidence of progressive LV dysfunction in the setting of known unrevascularized obstructive left  coronary disease. She has been reluctant to undergo CABG. She saw Dr. Martinique who felt potential PCI to mLAD would not offer same long term benefit as complete revascularization would and also recommended she undergo CABG. - There may also be component of PVC mediated CM (24% burden on Zio 6/22, though this may also be ischemically driven).   - NYHA Class II. Euvolemic on exam. - Start losartan 12.5 mg qhs (Did not tolerate Entresto due to fatigue and dizziness). BMET today, repeat in 10-14 days. - Increase Toprol to 25 mg daily. - Continue Jardiance 10 mg daily.  - Continue Lasix 20 mg PRN - Continue daily weights   2. CAD: LHC (12/22) 2V CAD w/ high grade mLAD disease=> pt declined CABG initially but how now re-considered and is agreeable. Has follow up with PVT next week. - No chest pain. - Continue ASA 81 mg daily  - Continue ? blocker and Zetia   3. Frequent PVCs: Possible component of PVC mediated CM (24% burden on Zio 6/22, though this may also be ischemically driven).   - Frequent PVCs on ECG today. Check TSH and Mag. - Increase beta blocker as above. - Place  Zio 2-week to quantify. May require amiodarone.  Follow up in 4 weeks with APP and 6-8 weeks with Dr. Aundra Dubin. We discussed follow ups are tentative based on timing of her surgery.  [place], FNP-BC. 11/01/21

## 2021-11-04 ENCOUNTER — Encounter: Payer: Medicare Other | Admitting: Cardiothoracic Surgery

## 2021-11-05 ENCOUNTER — Telehealth: Payer: Self-pay | Admitting: Family Medicine

## 2021-11-05 ENCOUNTER — Encounter (HOSPITAL_COMMUNITY): Payer: Self-pay

## 2021-11-05 NOTE — Telephone Encounter (Signed)
Left message for patient to call back and schedule Medicare Annual Wellness Visit (AWV) either virtually or in office. Left  my [name] number [phone]   Last AWV ;11/06/20  please schedule at anytime with [facility] Nurse Health Advisor 1 or 2

## 2021-11-06 ENCOUNTER — Other Ambulatory Visit: Payer: Self-pay | Admitting: *Deleted

## 2021-11-06 ENCOUNTER — Encounter: Payer: Self-pay | Admitting: Cardiothoracic Surgery

## 2021-11-06 ENCOUNTER — Encounter: Payer: Self-pay | Admitting: Family Medicine

## 2021-11-06 ENCOUNTER — Ambulatory Visit (INDEPENDENT_AMBULATORY_CARE_PROVIDER_SITE_OTHER): Payer: Medicare Other | Admitting: Cardiothoracic Surgery

## 2021-11-06 VITALS — BP 122/54 | HR 75 | Resp 18 | Ht 68.0 in | Wt 183.0 lb

## 2021-11-06 DIAGNOSIS — R079 Chest pain, unspecified: Secondary | ICD-10-CM | POA: Diagnosis not present

## 2021-11-06 DIAGNOSIS — I2510 Atherosclerotic heart disease of native coronary artery without angina pectoris: Secondary | ICD-10-CM

## 2021-11-06 MED ORDER — OMEPRAZOLE 40 MG PO CPDR: 40.0000 mg | DELAYED_RELEASE_CAPSULE | Freq: Every day | ORAL | 2 refills | Status: DC

## 2021-11-06 NOTE — Progress Notes (Signed)
PCP is Martinique, Betty G, MD Referring Provider is Martinique, Kassem Kibbe M, MD  Chief Complaint  Patient presents with   Coronary Artery Disease    HPI: Patient returns for appointment to discuss surgical revascularization for her known coronary disease.  After cardiac catheterization approximately 6 months ago she has been seen several times regarding her two-vessel coronary disease.  She has been recommended coronary bypass surgery but her anxiety and doubts that she needs surgery have prevented going forward.  She has been having some exertional as well as at rest jaw pain which are new symptoms.  Her echo has shown her ejection fraction now at approximately 40%, slightly down from 50 to 55% at the time of cath.  Echocardiogram currently also shows evidence of aortic sclerosis, but no significant gradient.  The patient was seen in the cardiology heart failure clinic last month.  She was complaining of PVCs and a Zio patch was prescribed.  She also had her beta-blocker dose increased and was started on losartan.  She took off the Zio patch because she was contacted by the company and charged several $100 as co-pay which she cannot afford.  We had a long talk about coronary bypass surgery, the location of the incisions and the expected hospital stay as well as the longer term postoperative recovery.  She is planned ahead and reserved a rehab bed at [facility] center after discharge from the hospital.  She does not need to be read cath.  She has had a recent echocardiogram showing EF of 40% with mild mitral regurgitation.  Patient is a non-smoker. Patient did have vein stripping of the right leg by a surgeon several years ago and will need vein mapping prior to the procedure.  Her last hospitalization was in May for probable colitis associated with some pneumonia and she has resolved all of those symptoms.  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[place])    Anxiety    Arthritis     Diabetes mellitus without complication (HCC)    GERD (gastroesophageal reflux disease)    Heart murmur    Hyperlipidemia    Hypertension    Neuromuscular disorder (HCC)    Palpitations    Pneumonia    PONV (postoperative nausea and vomiting)    PTSD (post-traumatic stress disorder)    Ringing of ears, left     Past Surgical History:  Procedure Laterality Date   ABDOMINAL HYSTERECTOMY     partial   ACHILLES TENDON SURGERY Left    APPENDECTOMY     BACK SURGERY     BLADDER SUSPENSION     BREAST SURGERY Right 2003   milk duct removed   CHOLECYSTECTOMY     FRACTURE SURGERY     collar bone   LEFT HEART CATH AND CORONARY ANGIOGRAPHY N/A 03/08/2021   Procedure: LEFT HEART CATH AND CORONARY ANGIOGRAPHY;  Surgeon: Martinique, Jannice Beitzel M, MD;  Location: [facility];  Service: Cardiovascular;  Laterality: N/A;   NOSE SURGERY     x 2    OTHER SURGICAL HISTORY     Breast Duct removed   PARTIAL HYSTERECTOMY     RECTOCELE REPAIR     TONSILLECTOMY     VASCULAR SURGERY Right    right leg   VEIN SURGERY Right    leg    Family History  Problem Relation Age of Onset   Diabetes Mother    Alzheimer's disease Mother    Lung cancer Father  Esophageal cancer Brother    Colon cancer Neg Hx     Social History Social History   Tobacco Use   Smoking status: Never   Smokeless tobacco: Never   Tobacco comments:    Never smoke 11/01/21  Vaping Use   Vaping Use: Never used  Substance Use Topics   Alcohol use: Never   Drug use: No    Current Outpatient Medications  Medication Sig Dispense Refill   albuterol (VENTOLIN HFA) 108 (90 Base) MCG/ACT inhaler INHALE 2 PUFFS INTO THE LUNGS EVERY 6 HOURS AS NEEDED FOR WHEEZING OR SHORTNESS OF BREATH 18 g 2   Alcohol Swabs (B-D SINGLE USE SWABS REGULAR) PADS Use to test blood sugar 3 times daily 100 each 3   aspirin EC 81 MG tablet Take 81 mg by mouth daily. Swallow whole.     Blood Glucose Calibration (ACCU-CHEK AVIVA) SOLN Use as directed  1 each 2   Blood Glucose Monitoring Suppl (ONETOUCH VERIO FLEX SYSTEM) w/Device KIT Use to test blood sugars 1-2 times daily. 1 kit 0   clonazePAM (KLONOPIN) 1 MG tablet TAKE 1 TABLET(1 MG) BY MOUTH TWICE DAILY AS NEEDED FOR ANXIETY 60 tablet 3   Dulaglutide (TRULICITY) 1.5 DX/8.3JA SOPN Inject 1.5 mg into the skin once a week. 13 mL 3   ezetimibe (ZETIA) 10 MG tablet TAKE 1 TABLET(10 MG) BY MOUTH DAILY 90 tablet 2   fluticasone (FLONASE) 50 MCG/ACT nasal spray Place 2 sprays into both nostrils daily as needed for allergies or rhinitis. 16 g 3   furosemide (LASIX) 20 MG tablet Take 1 tablet (20 mg total) by mouth as needed. 90 tablet 3   gabapentin (NEURONTIN) 600 MG tablet TAKE 1 TABLET BY MOUTH EVERY MORNING AND AT NOON AND EVERY EVENING AND AT BEDTIME 120 tablet 3   glucose blood (ONETOUCH VERIO) test strip Use to test blood sugar 1-2 times daily. 100 each 12   HYDROcodone-acetaminophen (NORCO) 10-325 MG tablet Take 1 tablet by mouth every 6 (six) hours as needed. Change in Sig: Do not fill before 11/14/2021 120 tablet 0   JARDIANCE 10 MG TABS tablet Take 1 tablet (10 mg total) by mouth daily. 90 tablet 3   Lancets (ONETOUCH ULTRASOFT) lancets Use to test blood sugar 1-2 times daily. 100 each 12   losartan (COZAAR) 25 MG tablet Take 0.5 tablets (12.5 mg total) by mouth at bedtime. 45 tablet 3   Magnesium 250 MG TABS Take 250 mg by mouth daily.     metoprolol succinate (TOPROL XL) 25 MG 24 hr tablet Take 1 tablet (25 mg total) by mouth daily. 90 tablet 3   omeprazole (PRILOSEC) 40 MG capsule Take 1 capsule (40 mg total) by mouth daily. 90 capsule 2   ondansetron (ZOFRAN-ODT) 4 MG disintegrating tablet Take 1 tablet (4 mg total) by mouth daily as needed. 20 tablet 2   potassium chloride SA (KLOR-CON M) 20 MEQ tablet TAKE 1 TABLET BY MOUTH EVERY DAY 30 tablet 3   QUEtiapine (SEROQUEL) 100 MG tablet Take 0.5 tablets (50 mg total) by mouth at bedtime. 45 tablet 3   triamcinolone (KENALOG) 0.1 %  paste Use as directed 1 application. in the mouth or throat 2 (two) times daily. 5 g 1   No current facility-administered medications for this visit.    Allergies  Allergen Reactions   Hydromorphone Nausea And Vomiting and Other (See Comments)    Made the patient feel "spaced out,"     Morphine And Related Itching  With high doses   Other Itching   Penicillins Rash    Has patient had a PCN reaction causing immediate rash, facial/tongue/throat swelling, SOB or lightheadedness with hypotension: Yes Has patient had a PCN reaction causing severe rash involving mucus membranes or skin necrosis: No Has patient had a PCN reaction that required hospitalization: No Has patient had a PCN reaction occurring within the last 10 years: No If all of the above answers are "NO", then may proceed with Cephalosporin use.     Review of Systems: Weight stable no fevers Poor mobility due to previous back surgery with spinal rod Intermittent jaw pain and reflux symptoms could be anginal equivalents No peripheral edema No abdominal pain History of diabetic neuropathy of her feet for which she takes Neurontin.   BP (!) 122/54 (BP Location: Right Arm, Patient Position: Sitting)   Pulse 75   Resp 18   Ht 5' 8"  (1.727 m)   Wt 183 lb (83 kg)   SpO2 96% Comment: RA  BMI 27.83 kg/m  Physical Exam:      Exam    General- alert and comfortable    Neck- no JVD, no cervical adenopathy palpable, no carotid bruit   Lungs- clear without rales, wheezes   Cor- regular rate and rhythm, no murmur , gallop   Abdomen- soft, non-tender   Extremities - warm, non-tender, minimal edema   Neuro- oriented, appropriate, no focal weakness   Diagnostic Tests: Coronary angiogram shows high-grade proximal LAD stenosis with an atretic distal vessel and a larger diagonal vessel with proximal stenosis.  Right coronary is no disease.  The circumflex has a smaller first marginal with proximal stenosis as well as a larger  OM 2/OM 3 bifurcation with a proximal stenosis. Echocardiogram shows EF 40-45% with calcified aortic valve without stenosis and mild MR.  Impression:  Persistent class III/class IV angina on optimal medical therapy for CAD.  Her LV ectopy and LVEF are also showing signs of worsening and are probably related to her coronary disease.  All of these findings strongly indicate the patient would benefit from multivessel coronary bypass surgery.  We have discussed these expected benefits from the surgery as well as the potential risks and her surgery has been scheduled at [facility] on August 10.  Plan:  Continue current medications Office will call regarding preoperative evaluation 48 hours before surgery scheduled for August 10.   Dahlia Byes, MD [facility] [phone]

## 2021-11-07 ENCOUNTER — Ambulatory Visit: Payer: Self-pay

## 2021-11-07 ENCOUNTER — Encounter: Payer: Self-pay | Admitting: *Deleted

## 2021-11-07 ENCOUNTER — Ambulatory Visit: Payer: Medicare Other | Admitting: Orthopaedic Surgery

## 2021-11-07 DIAGNOSIS — M25532 Pain in left wrist: Secondary | ICD-10-CM

## 2021-11-07 DIAGNOSIS — M654 Radial styloid tenosynovitis [de Quervain]: Secondary | ICD-10-CM

## 2021-11-07 MED ORDER — BUPIVACAINE HCL 0.25 % IJ SOLN
1.0000 mL | INTRAMUSCULAR | Status: AC | PRN
  Administered 2021-11-07: 1 mL

## 2021-11-07 MED ORDER — LIDOCAINE HCL 1 % IJ SOLN
1.0000 mL | INTRAMUSCULAR | Status: AC | PRN
  Administered 2021-11-07: 1 mL

## 2021-11-07 MED ORDER — METHYLPREDNISOLONE ACETATE 40 MG/ML IJ SUSP
40.0000 mg | INTRAMUSCULAR | Status: AC | PRN
  Administered 2021-11-07: 40 mg

## 2021-11-07 NOTE — Progress Notes (Signed)
Office Visit Note   Patient: [name]           Date of Birth: [birth year]           MRN: [id number] Visit Date: 11/07/2021              Requested by: Martinique, Betty G, MD [address] [place],  [place] [address] PCP: Martinique, Betty G, MD   Assessment & Plan: Visit Diagnoses:  1. Pain in left wrist   2. De Quervain's disease (radial styloid tenosynovitis)     Plan: Injection performed with immediate relief.  She has the splint she used with the opposite hand which is reversible and she cannot apply to the left wrist.  Follow-up if needed.  Follow-Up Instructions: No follow-ups on file.   Orders:  Orders Placed This Encounter  Procedures   XR Wrist 2 Views Left   No orders of the defined types were placed in this encounter.     Procedures: Hand/UE Inj: L extensor compartment 1 for de Quervain's tenosynovitis on 11/07/2021 3:11 PM Medications: 1 mL lidocaine 1 %; 1 mL bupivacaine 0.25 %; 40 mg methylPREDNISolone acetate 40 MG/ML      Clinical Data: No additional findings.   Subjective: Chief Complaint  Patient presents with   Left Wrist - Pain    HPI 71 year old female getting ready to have bypass surgery by Dr. Dahlia Byes next week is seen with a new problem which is left wrist first dorsal compartment stenosing tenosynovitis.  I had seen her in the past injected her opposite right wrist back in June.  Right wrist doing well left wrist is now extremely painful gripping and squeezing she points to the first dorsal compartment and she is requesting an injection.  Review of Systems type 2 diabetes coronary disease pending CABG.  All the systems noncontributory.   Objective: Vital Signs: There were no vitals taken for this visit.  Physical Exam Constitutional:      Appearance: She is well-developed.  HENT:     Head: Normocephalic.     Right Ear: External ear normal.     Left Ear: External ear normal. There is no impacted cerumen.  Eyes:      Pupils: Pupils are equal, round, and reactive to light.  Neck:     Thyroid: No thyromegaly.     Trachea: No tracheal deviation.  Cardiovascular:     Rate and Rhythm: Normal rate.  Pulmonary:     Effort: Pulmonary effort is normal.  Abdominal:     Palpations: Abdomen is soft.  Musculoskeletal:     Cervical back: No rigidity.  Skin:    General: Skin is warm and dry.  Neurological:     Mental Status: She is alert and oriented to person, place, and time.  Psychiatric:        Behavior: Behavior normal.     Ortho Exam positive tenderness first dorsal compartment.  Positive Finkelstein test left wrist.  Specialty Comments:  No specialty comments available.  Imaging: No results found.   PMFS History: Patient Active Problem List   Diagnosis Date Noted   Chest pain at rest 11/06/2021   Statin myopathy 10/18/2021   Harriet Pho disease (radial styloid tenosynovitis) 09/26/2021   NSTEMI (non-ST elevated myocardial infarction) ([place]) 08/22/2021   Nausea vomiting and diarrhea 08/22/2021   AKI (acute kidney injury) ([place]) 08/22/2021   CAP (community acquired pneumonia) 08/22/2021   Pneumonia 22/29/7989   Chronic systolic CHF (congestive heart failure) ([place])  Abdominal cramping    History of atherosclerotic heart disease 07/15/2021   Coronary artery disease involving native coronary artery of native heart without angina pectoris 04/19/2021   Abnormal nuclear cardiac imaging test 03/08/2021   Diabetic peripheral neuropathy ([place]) 12/06/2020   Anxiety 01/27/2020   HNP (herniated nucleus pulposus), lumbar 01/13/2020   PTSD (post-traumatic stress disorder)    Polyneuropathy associated with underlying disease ([place]) 07/26/2019   Allergic rhinitis 07/26/2019   Atherosclerosis of aorta ([place]) 04/04/2019   Myalgia due to statin 11/18/2017   Depression, major, recurrent, in partial remission ([place]) 10/16/2017   Insomnia 10/16/2017   Chronic pain syndrome 09/07/2017   Spinal stenosis of  lumbar region 07/08/2017   DOE (dyspnea on exertion) 12/16/2016   Family history of early CAD 12/16/2016   Other chest pain 12/16/2016   Arthritis of left acromioclavicular joint 08/13/2016   Left shoulder pain 05/12/2016   Cervical spondylosis without myelopathy 05/12/2016   Rotator cuff syndrome, left 05/12/2016   Restless leg syndrome 05/12/2016   Primary osteoarthritis, left hand 05/12/2016   MELENA 10/18/2008   GASTROPARESIS 05/30/2008   Type 2 diabetes mellitus with neurological complications ([place]) 52/84/1324   HYPERCHOLESTEROLEMIA 05/04/2008   HYPOKALEMIA 05/04/2008   GERD 05/04/2008   Constipation 05/04/2008   HEMATEMESIS 05/04/2008   Essential hypertension 05/04/2008   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[place])    Anxiety    Arthritis    Diabetes mellitus without complication (HCC)    GERD (gastroesophageal reflux disease)    Heart murmur    Hyperlipidemia    Hypertension    Neuromuscular disorder (HCC)    Palpitations    Pneumonia    PONV (postoperative nausea and vomiting)    PTSD (post-traumatic stress disorder)    Ringing of ears, left     Family History  Problem Relation Age of Onset   Diabetes Mother    Alzheimer's disease Mother    Lung cancer Father    Esophageal cancer Brother    Colon cancer Neg Hx     Past Surgical History:  Procedure Laterality Date   ABDOMINAL HYSTERECTOMY     partial   ACHILLES TENDON SURGERY Left    APPENDECTOMY     BACK SURGERY     BLADDER SUSPENSION     BREAST SURGERY Right 2003   milk duct removed   CHOLECYSTECTOMY     FRACTURE SURGERY     collar bone   LEFT HEART CATH AND CORONARY ANGIOGRAPHY N/A 03/08/2021   Procedure: LEFT HEART CATH AND CORONARY ANGIOGRAPHY;  Surgeon: Martinique, Peter M, MD;  Location: [facility];  Service: Cardiovascular;  Laterality: N/A;   NOSE SURGERY     x 2    OTHER SURGICAL HISTORY     Breast Duct removed   PARTIAL HYSTERECTOMY     RECTOCELE REPAIR      TONSILLECTOMY     VASCULAR SURGERY Right    right leg   VEIN SURGERY Right    leg   Social History   Occupational History   Occupation: retired    Comment: work part time  Tobacco Use   Smoking status: Never   Smokeless tobacco: Never   Tobacco comments:    Never smoke 11/01/21  Vaping Use   Vaping Use: Never used  Substance and Sexual Activity   Alcohol use: Never   Drug use: No   Sexual activity: Not on file

## 2021-11-08 NOTE — Telephone Encounter (Signed)
Message to [employer]

## 2021-11-11 ENCOUNTER — Encounter (HOSPITAL_COMMUNITY): Payer: Medicare Other

## 2021-11-11 ENCOUNTER — Other Ambulatory Visit: Payer: Self-pay | Admitting: Family Medicine

## 2021-11-11 ENCOUNTER — Other Ambulatory Visit (HOSPITAL_COMMUNITY): Payer: Medicare Other

## 2021-11-11 DIAGNOSIS — F3341 Major depressive disorder, recurrent, in partial remission: Secondary | ICD-10-CM

## 2021-11-11 DIAGNOSIS — M5416 Radiculopathy, lumbar region: Secondary | ICD-10-CM | POA: Diagnosis not present

## 2021-11-11 DIAGNOSIS — I493 Ventricular premature depolarization: Secondary | ICD-10-CM | POA: Diagnosis not present

## 2021-11-11 DIAGNOSIS — G4700 Insomnia, unspecified: Secondary | ICD-10-CM

## 2021-11-11 NOTE — Pre-Procedure Instructions (Signed)
Surgical Instructions    Your procedure is scheduled on November 14, 2021.  Report to [facility] Main Entrance "A" at 5:30 A.M., then check in with the Admitting office.  Call this number if you have problems the morning of surgery:  [phone]   If you have any questions prior to your surgery date call [phone]: Open Monday-Friday 8am-4pm    Remember:  Do not eat or drink after midnight the night before your surgery     Take these medicines the morning of surgery with A SIP OF WATER:  ezetimibe (ZETIA)   fluticasone (FLONASE)   gabapentin (NEURONTIN)  guaiFENesin (MUCINEX)  loratadine (CLARITIN)   metoprolol succinate (TOPROL XL)   omeprazole (PRILOSEC)    Take these medicines the morning of surgery AS NEEDED:  albuterol (VENTOLIN HFA) - bring inhaler with you to hospital  clonazePAM (KLONOPIN)  ondansetron (ZOFRAN-ODT)   Follow your surgeon's instructions on when to stop Aspirin.  If no instructions were given by your surgeon then you will need to call the office to get those instructions.     As of today, STOP taking any Aleve, Naproxen, Ibuprofen, Motrin, Advil, Goody's, BC's, all herbal medications, fish oil, and all vitamins.   WHAT DO I DO ABOUT MY DIABETES MEDICATION?   STOP taking JARDIANCE three days prior to surgery.  The day of surgery, do not take Dulaglutide (TRULICITY).    HOW TO MANAGE YOUR DIABETES BEFORE AND AFTER SURGERY  Why is it important to control my blood sugar before and after surgery? Improving blood sugar levels before and after surgery helps healing and can limit problems. A way of improving blood sugar control is eating a healthy diet by:  Eating less sugar and carbohydrates  Increasing activity/exercise  Talking with your doctor about reaching your blood sugar goals High blood sugars (greater than 180 mg/dL) can raise your risk of infections and slow your recovery, so you will need to focus on controlling your diabetes during  the weeks before surgery. Make sure that the doctor who takes care of your diabetes knows about your planned surgery including the date and location.  How do I manage my blood sugar before surgery? Check your blood sugar at least 4 times a day, starting 2 days before surgery, to make sure that the level is not too high or low.  Check your blood sugar the morning of your surgery when you wake up and every 2 hours until you get to the Short Stay unit.  If your blood sugar is less than 70 mg/dL, you will need to treat for low blood sugar: Do not take insulin. Treat a low blood sugar (less than 70 mg/dL) with  cup of clear juice (cranberry or apple), 4 glucose tablets, OR glucose gel. Recheck blood sugar in 15 minutes after treatment (to make sure it is greater than 70 mg/dL). If your blood sugar is not greater than 70 mg/dL on recheck, call [phone] for further instructions. Report your blood sugar to the short stay nurse when you get to Short Stay.  If you are admitted to the hospital after surgery: Your blood sugar will be checked by the staff and you will probably be given insulin after surgery (instead of oral diabetes medicines) to make sure you have good blood sugar levels. The goal for blood sugar control after surgery is 80-180 mg/dL.                      Do NOT  Smoke (Tobacco/Vaping) for 24 hours prior to your procedure.  If you use a CPAP at night, you may bring your mask/headgear for your overnight stay.   Contacts, glasses, piercing's, hearing aid's, dentures or partials may not be worn into surgery, please bring cases for these belongings.    For patients admitted to the hospital, discharge time will be determined by your treatment team.   Patients discharged the day of surgery will not be allowed to drive home, and someone needs to stay with them for 24 hours.  SURGICAL WAITING ROOM VISITATION Patients having surgery or a procedure may have no more than 2 support people  in the waiting area - these visitors may rotate.   Children under the age of 52 must have an adult with them who is not the patient. If the patient needs to stay at the hospital during part of their recovery, the visitor guidelines for inpatient rooms apply. Pre-op nurse will coordinate an appropriate time for 1 support person to accompany patient in pre-op.  This support person may not rotate.   Please refer to the [facility] website for the visitor guidelines for Inpatients (after your surgery is over and you are in a regular room).    Special instructions:   [facility]- Preparing For Surgery  Before surgery, you can play an important role. Because skin is not sterile, your skin needs to be as free of germs as possible. You can reduce the number of germs on your skin by washing with CHG (chlorahexidine gluconate) Soap before surgery.  CHG is an antiseptic cleaner which kills germs and bonds with the skin to continue killing germs even after washing.    Oral Hygiene is also important to reduce your risk of infection.  Remember - BRUSH YOUR TEETH THE MORNING OF SURGERY WITH YOUR REGULAR TOOTHPASTE  Please do not use if you have an allergy to CHG or antibacterial soaps. If your skin becomes reddened/irritated stop using the CHG.  Do not shave (including legs and underarms) for at least 48 hours prior to first CHG shower. It is OK to shave your face.  Please follow these instructions carefully.   Shower the NIGHT BEFORE SURGERY and the MORNING OF SURGERY  If you chose to wash your hair, wash your hair first as usual with your normal shampoo.  After you shampoo, rinse your hair and body thoroughly to remove the shampoo.  Use CHG Soap as you would any other liquid soap. You can apply CHG directly to the skin and wash gently with a scrungie or a clean washcloth.   Apply the CHG Soap to your body ONLY FROM THE NECK DOWN.  Do not use on open wounds or open sores. Avoid contact with your eyes,  ears, mouth and genitals (private parts). Wash Face and genitals (private parts)  with your normal soap.   Wash thoroughly, paying special attention to the area where your surgery will be performed.  Thoroughly rinse your body with warm water from the neck down.  DO NOT shower/wash with your normal soap after using and rinsing off the CHG Soap.  Pat yourself dry with a CLEAN TOWEL.  Wear CLEAN PAJAMAS to bed the night before surgery  Place CLEAN SHEETS on your bed the night before your surgery  DO NOT SLEEP WITH PETS.   Day of Surgery: Take a shower with CHG soap. Do not wear jewelry or makeup Do not wear lotions, powders, perfumes/colognes, or deodorant. Do not shave 48 hours prior  to surgery.   Do not bring valuables to the hospital.  [facility] is not responsible for any belongings or valuables. Do not wear nail polish, gel polish, artificial nails, or any other type of covering on natural nails (fingers and toes) If you have artificial nails or gel coating that need to be removed by a nail salon, please have this removed prior to surgery. Artificial nails or gel coating may interfere with anesthesia's ability to adequately monitor your vital signs.  Wear Clean/Comfortable clothing the morning of surgery Remember to brush your teeth WITH YOUR REGULAR TOOTHPASTE.   Please read over the following fact sheets that you were given.    If you received a COVID test during your pre-op visit  it is requested that you wear a mask when out in public, stay away from anyone that may not be feeling well and notify your surgeon if you develop symptoms. If you have been in contact with anyone that has tested positive in the last 10 days please notify you surgeon.

## 2021-11-12 ENCOUNTER — Encounter (HOSPITAL_COMMUNITY)
Admission: RE | Admit: 2021-11-12 | Discharge: 2021-11-12 | Disposition: A | Discharge status: Home / Self Care | Payer: Medicare Other | Source: Ambulatory Visit | Attending: Cardiothoracic Surgery | Admitting: Cardiothoracic Surgery

## 2021-11-12 ENCOUNTER — Ambulatory Visit (HOSPITAL_COMMUNITY)
Admission: RE | Admit: 2021-11-12 | Discharge: 2021-11-12 | Disposition: A | Discharge status: Home / Self Care | Payer: Medicare Other | Source: Ambulatory Visit | Attending: Cardiothoracic Surgery | Admitting: Cardiothoracic Surgery

## 2021-11-12 ENCOUNTER — Other Ambulatory Visit: Payer: Self-pay

## 2021-11-12 ENCOUNTER — Encounter (HOSPITAL_COMMUNITY): Payer: Medicare Other

## 2021-11-12 ENCOUNTER — Ambulatory Visit (HOSPITAL_BASED_OUTPATIENT_CLINIC_OR_DEPARTMENT_OTHER)
Admission: RE | Admit: 2021-11-12 | Discharge: 2021-11-12 | Disposition: A | Discharge status: Home / Self Care | Payer: Medicare Other | Source: Ambulatory Visit | Attending: Cardiothoracic Surgery | Admitting: Cardiothoracic Surgery

## 2021-11-12 ENCOUNTER — Other Ambulatory Visit (HOSPITAL_COMMUNITY): Payer: Medicare Other

## 2021-11-12 ENCOUNTER — Encounter (HOSPITAL_COMMUNITY): Payer: Self-pay

## 2021-11-12 VITALS — BP 142/83 | HR 69 | Temp 97.8°F | Resp 18 | Ht 68.0 in | Wt 175.2 lb

## 2021-11-12 DIAGNOSIS — Z8701 Personal history of pneumonia (recurrent): Secondary | ICD-10-CM | POA: Diagnosis not present

## 2021-11-12 DIAGNOSIS — K219 Gastro-esophageal reflux disease without esophagitis: Secondary | ICD-10-CM | POA: Diagnosis present

## 2021-11-12 DIAGNOSIS — Z01818 Encounter for other preprocedural examination: Secondary | ICD-10-CM | POA: Insufficient documentation

## 2021-11-12 DIAGNOSIS — J9811 Atelectasis: Secondary | ICD-10-CM | POA: Diagnosis not present

## 2021-11-12 DIAGNOSIS — I25110 Atherosclerotic heart disease of native coronary artery with unstable angina pectoris: Secondary | ICD-10-CM | POA: Diagnosis not present

## 2021-11-12 DIAGNOSIS — I10 Essential (primary) hypertension: Secondary | ICD-10-CM | POA: Diagnosis not present

## 2021-11-12 DIAGNOSIS — I480 Paroxysmal atrial fibrillation: Secondary | ICD-10-CM | POA: Diagnosis not present

## 2021-11-12 DIAGNOSIS — Z833 Family history of diabetes mellitus: Secondary | ICD-10-CM | POA: Diagnosis not present

## 2021-11-12 DIAGNOSIS — Z48812 Encounter for surgical aftercare following surgery on the circulatory system: Secondary | ICD-10-CM | POA: Diagnosis not present

## 2021-11-12 DIAGNOSIS — E1149 Type 2 diabetes mellitus with other diabetic neurological complication: Secondary | ICD-10-CM | POA: Diagnosis not present

## 2021-11-12 DIAGNOSIS — R262 Difficulty in walking, not elsewhere classified: Secondary | ICD-10-CM | POA: Diagnosis not present

## 2021-11-12 DIAGNOSIS — R0989 Other specified symptoms and signs involving the circulatory and respiratory systems: Secondary | ICD-10-CM | POA: Diagnosis not present

## 2021-11-12 DIAGNOSIS — M1990 Unspecified osteoarthritis, unspecified site: Secondary | ICD-10-CM | POA: Diagnosis present

## 2021-11-12 DIAGNOSIS — I700 Atherosclerosis of aorta: Secondary | ICD-10-CM | POA: Diagnosis not present

## 2021-11-12 DIAGNOSIS — E785 Hyperlipidemia, unspecified: Secondary | ICD-10-CM | POA: Insufficient documentation

## 2021-11-12 DIAGNOSIS — D62 Acute posthemorrhagic anemia: Secondary | ICD-10-CM | POA: Diagnosis not present

## 2021-11-12 DIAGNOSIS — I2510 Atherosclerotic heart disease of native coronary artery without angina pectoris: Secondary | ICD-10-CM

## 2021-11-12 DIAGNOSIS — Z885 Allergy status to narcotic agent status: Secondary | ICD-10-CM | POA: Diagnosis not present

## 2021-11-12 DIAGNOSIS — D689 Coagulation defect, unspecified: Secondary | ICD-10-CM | POA: Diagnosis not present

## 2021-11-12 DIAGNOSIS — J811 Chronic pulmonary edema: Secondary | ICD-10-CM | POA: Diagnosis not present

## 2021-11-12 DIAGNOSIS — E8770 Fluid overload, unspecified: Secondary | ICD-10-CM | POA: Diagnosis not present

## 2021-11-12 DIAGNOSIS — M6281 Muscle weakness (generalized): Secondary | ICD-10-CM | POA: Diagnosis not present

## 2021-11-12 DIAGNOSIS — Z90711 Acquired absence of uterus with remaining cervical stump: Secondary | ICD-10-CM | POA: Diagnosis not present

## 2021-11-12 DIAGNOSIS — I5032 Chronic diastolic (congestive) heart failure: Secondary | ICD-10-CM | POA: Diagnosis not present

## 2021-11-12 DIAGNOSIS — Z20822 Contact with and (suspected) exposure to covid-19: Secondary | ICD-10-CM | POA: Insufficient documentation

## 2021-11-12 DIAGNOSIS — I110 Hypertensive heart disease with heart failure: Secondary | ICD-10-CM | POA: Diagnosis not present

## 2021-11-12 DIAGNOSIS — Z880 Allergy status to penicillin: Secondary | ICD-10-CM | POA: Diagnosis not present

## 2021-11-12 DIAGNOSIS — R008 Other abnormalities of heart beat: Secondary | ICD-10-CM | POA: Diagnosis present

## 2021-11-12 DIAGNOSIS — E119 Type 2 diabetes mellitus without complications: Secondary | ICD-10-CM | POA: Diagnosis not present

## 2021-11-12 DIAGNOSIS — Z951 Presence of aortocoronary bypass graft: Secondary | ICD-10-CM | POA: Diagnosis not present

## 2021-11-12 DIAGNOSIS — Z9181 History of falling: Secondary | ICD-10-CM | POA: Diagnosis not present

## 2021-11-12 DIAGNOSIS — R531 Weakness: Secondary | ICD-10-CM | POA: Diagnosis not present

## 2021-11-12 DIAGNOSIS — J90 Pleural effusion, not elsewhere classified: Secondary | ICD-10-CM | POA: Diagnosis not present

## 2021-11-12 DIAGNOSIS — R2681 Unsteadiness on feet: Secondary | ICD-10-CM | POA: Diagnosis not present

## 2021-11-12 DIAGNOSIS — Z4682 Encounter for fitting and adjustment of non-vascular catheter: Secondary | ICD-10-CM | POA: Diagnosis not present

## 2021-11-12 DIAGNOSIS — R011 Cardiac murmur, unspecified: Secondary | ICD-10-CM | POA: Diagnosis present

## 2021-11-12 DIAGNOSIS — Z9889 Other specified postprocedural states: Secondary | ICD-10-CM | POA: Diagnosis not present

## 2021-11-12 DIAGNOSIS — I350 Nonrheumatic aortic (valve) stenosis: Secondary | ICD-10-CM | POA: Insufficient documentation

## 2021-11-12 DIAGNOSIS — I25118 Atherosclerotic heart disease of native coronary artery with other forms of angina pectoris: Secondary | ICD-10-CM | POA: Diagnosis not present

## 2021-11-12 DIAGNOSIS — I088 Other rheumatic multiple valve diseases: Secondary | ICD-10-CM | POA: Diagnosis not present

## 2021-11-12 DIAGNOSIS — Z7401 Bed confinement status: Secondary | ICD-10-CM | POA: Diagnosis not present

## 2021-11-12 DIAGNOSIS — E1140 Type 2 diabetes mellitus with diabetic neuropathy, unspecified: Secondary | ICD-10-CM | POA: Diagnosis not present

## 2021-11-12 DIAGNOSIS — I440 Atrioventricular block, first degree: Secondary | ICD-10-CM | POA: Diagnosis not present

## 2021-11-12 DIAGNOSIS — I252 Old myocardial infarction: Secondary | ICD-10-CM | POA: Diagnosis not present

## 2021-11-12 DIAGNOSIS — J189 Pneumonia, unspecified organism: Secondary | ICD-10-CM | POA: Diagnosis not present

## 2021-11-12 DIAGNOSIS — F4310 Post-traumatic stress disorder, unspecified: Secondary | ICD-10-CM | POA: Diagnosis not present

## 2021-11-12 DIAGNOSIS — M47814 Spondylosis without myelopathy or radiculopathy, thoracic region: Secondary | ICD-10-CM | POA: Diagnosis not present

## 2021-11-12 DIAGNOSIS — I509 Heart failure, unspecified: Secondary | ICD-10-CM | POA: Diagnosis not present

## 2021-11-12 DIAGNOSIS — R918 Other nonspecific abnormal finding of lung field: Secondary | ICD-10-CM | POA: Diagnosis not present

## 2021-11-12 DIAGNOSIS — I471 Supraventricular tachycardia: Secondary | ICD-10-CM | POA: Diagnosis not present

## 2021-11-12 DIAGNOSIS — R6884 Jaw pain: Secondary | ICD-10-CM | POA: Diagnosis present

## 2021-11-12 LAB — URINALYSIS, ROUTINE W REFLEX MICROSCOPIC
Bacteria, UA: NONE SEEN
Bilirubin Urine: NEGATIVE
Glucose, UA: >=500 mg/dL — AB
Hgb urine dipstick: NEGATIVE
Ketones, ur: NEGATIVE mg/dL
Leukocytes,Ua: NEGATIVE
Nitrite: NEGATIVE
Protein, ur: NEGATIVE mg/dL
Specific Gravity, Urine: 1.006 (ref 1.005–1.030)
pH: 5 (ref 5.0–8.0)

## 2021-11-12 LAB — COMPREHENSIVE METABOLIC PANEL
ALT: 14 U/L (ref 0–44)
AST: 17 U/L (ref 15–41)
Albumin: 4.2 g/dL (ref 3.5–5.0)
Alkaline Phosphatase: 53 U/L (ref 38–126)
Anion gap: 10 (ref 5–15)
BUN: 21 mg/dL (ref 8–23)
CO2: 23 mmol/L (ref 22–32)
Calcium: 9.6 mg/dL (ref 8.9–10.3)
Chloride: 107 mmol/L (ref 98–111)
Creatinine, Ser: 0.94 mg/dL (ref 0.44–1.00)
GFR, Estimated: >60 mL/min (ref >60)
Glucose, Bld: 87 mg/dL (ref 70–99)
Potassium: 3.8 mmol/L (ref 3.5–5.1)
Sodium: 140 mmol/L (ref 135–145)
Total Bilirubin: 0.8 mg/dL (ref 0.3–1.2)
Total Protein: 7.2 g/dL (ref 6.5–8.1)

## 2021-11-12 LAB — GLUCOSE, CAPILLARY: Glucose-Capillary: 90 mg/dL (ref 70–99)

## 2021-11-12 LAB — BLOOD GAS, ARTERIAL
Acid-Base Excess: 1.3 mmol/L (ref 0.0–2.0)
Bicarbonate: 25.9 mmol/L (ref 20.0–28.0)
Drawn by: 58793
O2 Saturation: 99.1 %
Patient temperature: 37
pCO2 arterial: 40 mmHg (ref 32–48)
pH, Arterial: 7.42 (ref 7.35–7.45)
pO2, Arterial: 109 mmHg — ABNORMAL HIGH (ref 83–108)

## 2021-11-12 LAB — APTT: aPTT: 26 seconds (ref 24–36)

## 2021-11-12 LAB — CBC
HCT: 38.5 % (ref 36.0–46.0)
Hemoglobin: 13.5 g/dL (ref 12.0–15.0)
MCH: 31.5 pg (ref 26.0–34.0)
MCHC: 35.1 g/dL (ref 30.0–36.0)
MCV: 90 fL (ref 80.0–100.0)
Platelets: 232 10*3/uL (ref 150–400)
RBC: 4.28 MIL/uL (ref 3.87–5.11)
RDW: 11.7 % (ref 11.5–15.5)
WBC: 8.6 10*3/uL (ref 4.0–10.5)
nRBC: 0 % (ref 0.0–0.2)

## 2021-11-12 LAB — SURGICAL PCR SCREEN
MRSA, PCR: NEGATIVE
Staphylococcus aureus: NEGATIVE

## 2021-11-12 LAB — PROTIME-INR
INR: 1 (ref 0.8–1.2)
Prothrombin Time: 13.5 seconds (ref 11.4–15.2)

## 2021-11-12 NOTE — Progress Notes (Signed)
PCP - Martinique, Betty, MD Cardiologist - [facility]  PPM/ICD - denies Device Orders - n/a Rep Notified - n/a  Chest x-ray - 11/12/2021 EKG - 11/01/2021 Stress Test - 02/12/2021 ECHO - 08/22/2021 Cardiac Cath - 03/08/2021  Sleep Study - denies CPAP - n/a  Fasting Blood Sugar - 120 Checks Blood Sugar 2 times a day CBG today - 90 A1C - done in PAT on 11/12/2021  Blood Thinner Instructions: n/a  Aspirin Instructions: per patient she will continue raking Aspirin  Patient was instructed: As of today, STOP taking any Aleve, Naproxen, Ibuprofen, Motrin, Advil, Goody's, BC's, all herbal medications, fish oil, and all vitamins.    ERAS Protcol - n/a   COVID TEST- done in PAT on 11/12/2021   Anesthesia review: yes - cardiac history  Patient denies shortness of breath, fever, cough and chest pain at PAT appointment   All instructions explained to the patient, with a verbal understanding of the material. Patient agrees to go over the instructions while at home for a better understanding. Patient also instructed to self quarantine after being tested for COVID-19. The opportunity to ask questions was provided.

## 2021-11-12 NOTE — Progress Notes (Signed)
Patient was ready to leave after PAT appointment when she lost her balance and had a fall in the PAT hallway. Patient verbalized that she tripped over her flip flop. Patient didn't hit her head but she hit her right knee; the fall broke the skin and there was immediate bruising to her knee. This Probation officer and Lilia Pro, RN we helped the patient in a wheelchair, checked VS which were normal and call Ebony Hail, [place] for advice. Patient was asked to go to ED to have her knee checked out or to the Urgent Care. Patient verbalized that she can move her knee and she thinks she doesn't have any fracture on her knee; patient verbalized that she wants to go home and she will be OK. Patient was instructed to go and get check out her right knee if the pain/swollen will get worse, or she will have other symptoms. Patient verbalized understanding. Patient was accompanied by this writer to the main entrance where she was waiting for her car.   Dr. Darcey Nora office was called and informed about this incident Rolena Infante, Thurmond Butts, RN).

## 2021-11-13 LAB — SARS CORONAVIRUS 2 (TAT 6-24 HRS): SARS Coronavirus 2: NEGATIVE

## 2021-11-13 MED ORDER — TRANEXAMIC ACID 1000 MG/10ML IV SOLN
1.5000 mg/kg/h | INTRAVENOUS | Status: AC
  Administered 2021-11-14: 1.5 mg/kg/h via INTRAVENOUS
  Filled 2021-11-13: qty 25

## 2021-11-13 MED ORDER — NOREPINEPHRINE 4 MG/250ML-% IV SOLN
0.0000 ug/min | INTRAVENOUS | Status: DC
  Filled 2021-11-13: qty 250

## 2021-11-13 MED ORDER — TRANEXAMIC ACID (OHS) BOLUS VIA INFUSION
15.0000 mg/kg | INTRAVENOUS | Status: AC
  Administered 2021-11-14: 1192.5 mg via INTRAVENOUS
  Filled 2021-11-13: qty 1193

## 2021-11-13 MED ORDER — EPINEPHRINE HCL 5 MG/250ML IV SOLN IN NS
0.0000 ug/min | INTRAVENOUS | Status: DC
  Filled 2021-11-13: qty 250

## 2021-11-13 MED ORDER — NITROGLYCERIN IN D5W 200-5 MCG/ML-% IV SOLN
2.0000 ug/min | INTRAVENOUS | Status: AC
  Administered 2021-11-14: 16.6 ug/min via INTRAVENOUS
  Filled 2021-11-13: qty 250

## 2021-11-13 MED ORDER — INSULIN REGULAR(HUMAN) IN NACL 100-0.9 UT/100ML-% IV SOLN
INTRAVENOUS | Status: AC
  Administered 2021-11-14: 2.6 [IU]/h via INTRAVENOUS
  Filled 2021-11-13: qty 100

## 2021-11-13 MED ORDER — CEFAZOLIN SODIUM-DEXTROSE 2-4 GM/100ML-% IV SOLN
2.0000 g | INTRAVENOUS | Status: DC
  Filled 2021-11-13 (×2): qty 100

## 2021-11-13 MED ORDER — CEFAZOLIN SODIUM-DEXTROSE 2-4 GM/100ML-% IV SOLN
2.0000 g | INTRAVENOUS | Status: AC
  Administered 2021-11-14 (×2): 2 g via INTRAVENOUS

## 2021-11-13 MED ORDER — TRANEXAMIC ACID (OHS) PUMP PRIME SOLUTION
2.0000 mg/kg | INTRAVENOUS | Status: DC
  Filled 2021-11-13: qty 1.59

## 2021-11-13 MED ORDER — PLASMA-LYTE A IV SOLN
INTRAVENOUS | Status: DC
  Filled 2021-11-13: qty 2.5

## 2021-11-13 MED ORDER — VANCOMYCIN HCL 1250 MG/250ML IV SOLN
1250.0000 mg | INTRAVENOUS | Status: AC
  Administered 2021-11-14: 1250 mg via INTRAVENOUS
  Filled 2021-11-13: qty 250

## 2021-11-13 MED ORDER — HEPARIN 30,000 UNITS/1000 ML (OHS) CELLSAVER SOLUTION
Status: DC
  Filled 2021-11-13: qty 1000

## 2021-11-13 MED ORDER — MILRINONE LACTATE IN DEXTROSE 20-5 MG/100ML-% IV SOLN
0.3000 ug/kg/min | INTRAVENOUS | Status: DC
  Filled 2021-11-13: qty 100

## 2021-11-13 MED ORDER — POTASSIUM CHLORIDE 2 MEQ/ML IV SOLN
80.0000 meq | INTRAVENOUS | Status: DC
  Filled 2021-11-13: qty 40

## 2021-11-13 MED ORDER — PHENYLEPHRINE HCL-NACL 20-0.9 MG/250ML-% IV SOLN
30.0000 ug/min | INTRAVENOUS | Status: AC
  Administered 2021-11-14: 10 ug/min via INTRAVENOUS
  Filled 2021-11-13: qty 250

## 2021-11-13 MED ORDER — DEXMEDETOMIDINE HCL IN NACL 400 MCG/100ML IV SOLN
0.1000 ug/kg/h | INTRAVENOUS | Status: AC
  Administered 2021-11-14: .2 ug/kg/h via INTRAVENOUS
  Filled 2021-11-13: qty 100

## 2021-11-13 MED ORDER — MAGNESIUM SULFATE 50 % IJ SOLN
40.0000 meq | INTRAMUSCULAR | Status: DC
  Filled 2021-11-13: qty 9.85

## 2021-11-13 NOTE — Progress Notes (Signed)
Anesthesia Chart Review:  Case: [id number] Date/Time: 11/14/21 0715   Procedures:      CORONARY ARTERY BYPASS GRAFTING (CABG) (Chest)     TRANSESOPHAGEAL ECHOCARDIOGRAM (TEE)   Anesthesia type: General   Pre-op diagnosis: CAD   Location: [facility] OR ROOM 55 / [facility]   Surgeons: Dahlia Byes, MD       DISCUSSION: Patient is a 71 year old female scheduled for the above procedure. CABG recommended in 03/2021, but she was very hesitant about proceeding. In July 2023 she was agreeable to proceed and was referred back to CT surgery.   History includes never smoker, post-operative N/V, HTN, HLD, CAD, chronic systolic CHF/cardiomyopathy (in setting of unrevascularized CAD and 23% burden PVCs 10/2021), murmur (mild MR, mild AS 08/2021 echo), palpitations (PVCs), DM2, tinnitus (left), GERD, adrenal gland cyst (stable calcified left adrenal lesions 08/23/21 CT), PTSD (by notes, following murder of her son), spinal surgery (redo L4-5 laminectomy & L4-5 PLIF 01/13/20), RLE vein stripping.  Dr. Prescott Gum noted she had prior RLE varicose vein stripping.   Of note, patient fell when leaving her PAT visit on 11/12/21. I was told by nursing staff that she tripped over her flip flop and hit her right knee. The skin was broken and there was immediate bruising (on ASA 81 mg, but no anticoagulants). The patient was placed in a wheelchair by two RN and vitals taken. While another RN called me to report the injury and provide details, I had planned to assess her knee to provide guidance of whether patient would need further evaluation of her knee injury in the ED or urgent care, but staff then told me that the patient was able to bear weight, was already being transported out of the hospital by RN staff because she declined further evaluation as she did not feel she had sustained any fracture. She primarily felt "embarrassed". RN staff did instruct her to seek further medical evaluation should she develop worsening knee pain or  swelling. TCTS RN Thurmond Butts notified and discussed with Dr. Prescott Gum. Thurmond Butts also called patient on 11/13/21 and she reported no issues with walking and no hematoma. Plan is to proceed with surgery as scheduled.    VS: BP (!) 142/83 (BP Location: Right Arm)   Pulse 69   Temp 36.6 C (Oral)   Resp 18   Ht _0  (1.727 m)   Wt 79.5 kg   SpO2 100%   BMI 26.64 kg/m    PROVIDERS: Martinique, Betty G, MD is PCP  Dorris Carnes, MD is primary cardiologist. She also saw Martinique, Peter, MD as interventional cardiologist Loralie Champagne, MD is HF cardiologist   LABS: Labs reviewed: Acceptable for surgery. (all labs ordered are listed, but only abnormal results are displayed)  Labs Reviewed  URINALYSIS, ROUTINE W REFLEX MICROSCOPIC - Abnormal; Notable for the following components:      Result Value   Color, Urine COLORLESS (*)    Glucose, UA >=500 (*)    All other components within normal limits  BLOOD GAS, ARTERIAL - Abnormal; Notable for the following components:   pO2, Arterial 109 (*)    All other components within normal limits  SARS CORONAVIRUS 2 (TAT 6-24 HRS)  SURGICAL PCR SCREEN  CBC  COMPREHENSIVE METABOLIC PANEL  PROTIME-INR  APTT  GLUCOSE, CAPILLARY  TYPE AND SCREEN     IMAGES: CXR 11/12/21: FINDINGS: Stable cardiac and mediastinal contours. No large area pulmonary consolidation. No pleural effusion or pneumothorax. Thoracic spine degenerative changes. IMPRESSION: No  active cardiopulmonary disease.    EKG: 10/02/21: Sinus rhythm with frequent Premature ventricular complexes Left axis deviation Moderate voltage criteria for LVH, may be normal variant ( R in aVL , Cornell product ) Anterolateral infarct , age undetermined Abnormal ECG When compared with ECG of 22-Aug-2021 02:27, No significant change was found Confirmed by Kathlyn Sacramento [phone]) on 11/03/2021 11:26:02 AM   CV: US Carotid 11/12/21: Summary:  - Right Carotid: Velocities in the right ICA are consistent with a  1-39% stenosis.  - Left Carotid: Velocities in the left ICA are consistent with a 1-39% stenosis.  - Vertebrals:  Left vertebral artery demonstrates antegrade flow. Right vertebral artery demonstrates high resistant flow.  - Subclavians: Normal flow hemodynamics were seen in bilateral subclavian arteries.    Long term monitor 11/01/21-11/06/21:  Patient had a min HR of 55 bpm, max HR of 156 bpm, and avg HR of 72 bpm. Predominant underlying rhythm was Sinus Rhythm. First Degree AV Block was present. 475 Supraventricular Tachycardia runs occurred, the run with the fastest interval lasting 7 beats  with a max rate of 156 bpm, the longest lasting 18 beats with an avg rate of 99 bpm. Supraventricular Tachycardia was detected within +/- 45 seconds of symptomatic patient event(s). Isolated SVEs were occasional (2.0%, 10273), SVE Couplets were rare  (<1.0%, 1086), and SVE Triplets were rare (<1.0%, 559). Isolated VEs were frequent (23.1%, [id number]), VE Couplets were rare (<1.0%, 224), and VE Triplets were rare (<1.0%, 32). Ventricular Bigeminy and Trigeminy were present.   Echo 08/22/21: IMPRESSIONS   1. Septal apical and inferior apical hypokinesis . Left ventricular  ejection fraction, by estimation, is 40 to 45%. The left ventricle has  mildly decreased function. The left ventricle demonstrates regional wall  motion abnormalities (see scoring  diagram/findings for description). The left ventricular internal cavity  size was mildly dilated. There is mild left ventricular hypertrophy. Left  ventricular diastolic parameters were normal.   2. Right ventricular systolic function is normal. The right ventricular  size is normal.   3. Left atrial size was moderately dilated.   4. The mitral valve is abnormal. Mild mitral valve regurgitation. No  evidence of mitral stenosis.   5. The aortic valve is tricuspid. There is moderate calcification of the  aortic valve. There is moderate thickening of the aortic  valve. Aortic  valve regurgitation is not visualized. Mild aortic valve stenosis.   6. The inferior vena cava is normal in size with greater than 50%  respiratory variability, suggesting right atrial pressure of 3 mmHg.    Cardiac cath 03/08/21:   Ost LAD to Prox LAD lesion is 40% stenosed.   Mid LAD lesion is 99% stenosed.   1st Diag lesion is 90% stenosed.   1st Mrg lesion is 80% stenosed.   2nd Mrg lesion is 90% stenosed.   Mid Cx lesion is 80% stenosed.   LV end diastolic pressure is normal.   There is no aortic valve stenosis.   Severe obstructive CAD with left dominant circulation Subtotal mid LAD stenosis Complex LCx disease with severe stenoses involving bifurcation of 4 branches of the LCx. Normal LVEDP   Plan: needs referral to CT surgery for CABG. The LCx disease is not amenable to PCI.    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[place])    Anxiety    Arthritis    Diabetes mellitus without complication (HCC)    GERD (gastroesophageal reflux disease)    Heart murmur  Hyperlipidemia    Hypertension    Neuromuscular disorder (HCC)    Palpitations    Pneumonia    PONV (postoperative nausea and vomiting)    PTSD (post-traumatic stress disorder)    Ringing of ears, left     Past Surgical History:  Procedure Laterality Date   ABDOMINAL HYSTERECTOMY     partial   ACHILLES TENDON SURGERY Left    APPENDECTOMY     BACK SURGERY     BLADDER SUSPENSION     BREAST SURGERY Right 2003   milk duct removed   CARDIAC CATHETERIZATION     CHOLECYSTECTOMY     FRACTURE SURGERY     collar bone   LEFT HEART CATH AND CORONARY ANGIOGRAPHY N/A 03/08/2021   Procedure: LEFT HEART CATH AND CORONARY ANGIOGRAPHY;  Surgeon: Martinique, Peter M, MD;  Location: [facility] [facility];  Service: Cardiovascular;  Laterality: N/A;   NOSE SURGERY     x 2    OTHER SURGICAL HISTORY     Breast Duct removed   PARTIAL HYSTERECTOMY     RECTOCELE REPAIR     TONSILLECTOMY      VASCULAR SURGERY Right    right leg   VEIN SURGERY Right    leg    MEDICATIONS:  albuterol (VENTOLIN HFA) 108 (90 Base) MCG/ACT inhaler   Alcohol Swabs (B-D SINGLE USE SWABS REGULAR) PADS   aspirin EC 81 MG tablet   AZO-CRANBERRY PO   Blood Glucose Calibration (ACCU-CHEK AVIVA) SOLN   Blood Glucose Monitoring Suppl (ONETOUCH VERIO FLEX SYSTEM) w/Device KIT   clonazePAM (KLONOPIN) 1 MG tablet   Dulaglutide (TRULICITY) 1.5 BS/9.6GE SOPN   ezetimibe (ZETIA) 10 MG tablet   fluticasone (FLONASE) 50 MCG/ACT nasal spray   furosemide (LASIX) 20 MG tablet   gabapentin (NEURONTIN) 600 MG tablet   glucose blood (ONETOUCH VERIO) test strip   guaiFENesin (MUCINEX) 600 MG 12 hr tablet   HYDROcodone-acetaminophen (NORCO) 10-325 MG tablet   JARDIANCE 10 MG TABS tablet   Lancets (ONETOUCH ULTRASOFT) lancets   loratadine (CLARITIN) 10 MG tablet   losartan (COZAAR) 25 MG tablet   Magnesium 500 MG TABS   Melatonin 10 MG TABS   metoprolol succinate (TOPROL XL) 25 MG 24 hr tablet   multivitamin-lutein (OCUVITE-LUTEIN) CAPS capsule   mupirocin ointment (BACTROBAN) 2 %   omeprazole (PRILOSEC) 40 MG capsule   ondansetron (ZOFRAN-ODT) 4 MG disintegrating tablet   Polyethyl Glycol-Propyl Glycol (SYSTANE) 0.4-0.3 % SOLN   potassium chloride SA (KLOR-CON M) 20 MEQ tablet   QUEtiapine (SEROQUEL) 100 MG tablet   triamcinolone (KENALOG) 0.1 % paste   No current facility-administered medications for this encounter.    Myra Gianotti, PA-C Surgical Short Stay/Anesthesiology [facility] Phone [phone] [facility] Phone [phone] 11/13/2021 10:43 AM

## 2021-11-13 NOTE — Anesthesia Preprocedure Evaluation (Addendum)
Anesthesia Evaluation  Patient identified by MRN, date of birth, ID band Patient awake    Reviewed: Allergy & Precautions, NPO status   Airway Mallampati: II  TM Distance: >3 FB Neck ROM: Full    Dental  (+) Dental Advisory Given   Pulmonary neg pulmonary ROS,    breath sounds clear to auscultation       Cardiovascular hypertension, Pt. on medications and Pt. on home beta blockers + CAD, + Past MI, +CHF and + DOE  + Valvular Problems/Murmurs AS  Rhythm:Regular Rate:Normal  EF 40-45%. Mild AS. Mild MR   Neuro/Psych  Neuromuscular disease    GI/Hepatic Neg liver ROS, GERD  ,  Endo/Other  diabetes, Type 2  Renal/GU Renal disease     Musculoskeletal  (+) Arthritis ,   Abdominal   Peds  Hematology negative hematology ROS (+)   Anesthesia Other Findings   Reproductive/Obstetrics                            Lab Results  Component Value Date   WBC 8.6 11/12/2021   HGB 13.5 11/12/2021   HCT 38.5 11/12/2021   MCV 90.0 11/12/2021   PLT 232 11/12/2021   Lab Results  Component Value Date   CREATININE 0.94 11/12/2021   BUN 21 11/12/2021   NA 140 11/12/2021   K 3.8 11/12/2021   CL 107 11/12/2021   CO2 23 11/12/2021    Anesthesia Physical Anesthesia Plan  ASA: 4  Anesthesia Plan: General   Post-op Pain Management: Tylenol PO (pre-op)*   Induction:   PONV Risk Score and Plan: 3 and Dexamethasone, Ondansetron, Midazolam and Treatment may vary due to age or medical condition  Airway Management Planned: Oral ETT  Additional Equipment: Arterial line, CVP, PA Cath, TEE and Ultrasound Guidance Line Placement  Intra-op Plan:   Post-operative Plan: Post-operative intubation/ventilation  Informed Consent: I have reviewed the patients History and Physical, chart, labs and discussed the procedure including the risks, benefits and alternatives for the proposed anesthesia with the patient or  authorized representative who has indicated his/her understanding and acceptance.     Dental advisory given  Plan Discussed with: CRNA  Anesthesia Plan Comments: ( )       Anesthesia Quick Evaluation

## 2021-11-14 ENCOUNTER — Inpatient Hospital Stay (HOSPITAL_COMMUNITY): Payer: Medicare Other

## 2021-11-14 ENCOUNTER — Inpatient Hospital Stay (HOSPITAL_COMMUNITY): Payer: Medicare Other | Admitting: Certified Registered Nurse Anesthetist

## 2021-11-14 ENCOUNTER — Encounter (HOSPITAL_COMMUNITY): Payer: Self-pay | Admitting: Cardiothoracic Surgery

## 2021-11-14 ENCOUNTER — Other Ambulatory Visit: Payer: Self-pay

## 2021-11-14 ENCOUNTER — Inpatient Hospital Stay (HOSPITAL_COMMUNITY): Payer: Medicare Other | Admitting: Vascular Surgery

## 2021-11-14 ENCOUNTER — Inpatient Hospital Stay (HOSPITAL_COMMUNITY)
Admission: RE | Admit: 2021-11-14 | Discharge: 2021-11-22 | DRG: 236 | Disposition: A | Discharge status: Skilled Nursing Facility | Payer: Medicare Other | Attending: Cardiothoracic Surgery | Admitting: Cardiothoracic Surgery

## 2021-11-14 ENCOUNTER — Inpatient Hospital Stay (HOSPITAL_COMMUNITY)
Admission: RE | Disposition: A | Discharge status: Skilled Nursing Facility | Payer: Self-pay | Source: Home / Self Care | Attending: Cardiothoracic Surgery

## 2021-11-14 DIAGNOSIS — Z888 Allergy status to other drugs, medicaments and biological substances status: Secondary | ICD-10-CM

## 2021-11-14 DIAGNOSIS — D62 Acute posthemorrhagic anemia: Secondary | ICD-10-CM | POA: Diagnosis not present

## 2021-11-14 DIAGNOSIS — Z951 Presence of aortocoronary bypass graft: Principal | ICD-10-CM

## 2021-11-14 DIAGNOSIS — I10 Essential (primary) hypertension: Secondary | ICD-10-CM | POA: Diagnosis present

## 2021-11-14 DIAGNOSIS — Z9049 Acquired absence of other specified parts of digestive tract: Secondary | ICD-10-CM

## 2021-11-14 DIAGNOSIS — Z833 Family history of diabetes mellitus: Secondary | ICD-10-CM

## 2021-11-14 DIAGNOSIS — I509 Heart failure, unspecified: Secondary | ICD-10-CM

## 2021-11-14 DIAGNOSIS — I440 Atrioventricular block, first degree: Secondary | ICD-10-CM | POA: Diagnosis present

## 2021-11-14 DIAGNOSIS — K219 Gastro-esophageal reflux disease without esophagitis: Secondary | ICD-10-CM | POA: Diagnosis present

## 2021-11-14 DIAGNOSIS — Z90711 Acquired absence of uterus with remaining cervical stump: Secondary | ICD-10-CM | POA: Diagnosis not present

## 2021-11-14 DIAGNOSIS — I480 Paroxysmal atrial fibrillation: Secondary | ICD-10-CM | POA: Diagnosis not present

## 2021-11-14 DIAGNOSIS — I25118 Atherosclerotic heart disease of native coronary artery with other forms of angina pectoris: Secondary | ICD-10-CM | POA: Diagnosis present

## 2021-11-14 DIAGNOSIS — E8770 Fluid overload, unspecified: Secondary | ICD-10-CM | POA: Diagnosis not present

## 2021-11-14 DIAGNOSIS — Z9181 History of falling: Secondary | ICD-10-CM | POA: Diagnosis not present

## 2021-11-14 DIAGNOSIS — E119 Type 2 diabetes mellitus without complications: Secondary | ICD-10-CM | POA: Diagnosis present

## 2021-11-14 DIAGNOSIS — D689 Coagulation defect, unspecified: Secondary | ICD-10-CM | POA: Diagnosis present

## 2021-11-14 DIAGNOSIS — I700 Atherosclerosis of aorta: Secondary | ICD-10-CM | POA: Diagnosis present

## 2021-11-14 DIAGNOSIS — R011 Cardiac murmur, unspecified: Secondary | ICD-10-CM | POA: Diagnosis present

## 2021-11-14 DIAGNOSIS — E785 Hyperlipidemia, unspecified: Secondary | ICD-10-CM | POA: Diagnosis present

## 2021-11-14 DIAGNOSIS — M1990 Unspecified osteoarthritis, unspecified site: Secondary | ICD-10-CM | POA: Diagnosis present

## 2021-11-14 DIAGNOSIS — I110 Hypertensive heart disease with heart failure: Secondary | ICD-10-CM

## 2021-11-14 DIAGNOSIS — I471 Supraventricular tachycardia: Secondary | ICD-10-CM | POA: Diagnosis present

## 2021-11-14 DIAGNOSIS — Z880 Allergy status to penicillin: Secondary | ICD-10-CM | POA: Diagnosis not present

## 2021-11-14 DIAGNOSIS — R008 Other abnormalities of heart beat: Secondary | ICD-10-CM | POA: Diagnosis present

## 2021-11-14 DIAGNOSIS — Z8701 Personal history of pneumonia (recurrent): Secondary | ICD-10-CM

## 2021-11-14 DIAGNOSIS — Z20822 Contact with and (suspected) exposure to covid-19: Secondary | ICD-10-CM | POA: Diagnosis present

## 2021-11-14 DIAGNOSIS — F4310 Post-traumatic stress disorder, unspecified: Secondary | ICD-10-CM | POA: Diagnosis present

## 2021-11-14 DIAGNOSIS — Z885 Allergy status to narcotic agent status: Secondary | ICD-10-CM

## 2021-11-14 DIAGNOSIS — I2510 Atherosclerotic heart disease of native coronary artery without angina pectoris: Secondary | ICD-10-CM

## 2021-11-14 DIAGNOSIS — R6884 Jaw pain: Secondary | ICD-10-CM | POA: Diagnosis present

## 2021-11-14 DIAGNOSIS — I4891 Unspecified atrial fibrillation: Secondary | ICD-10-CM

## 2021-11-14 DIAGNOSIS — I25110 Atherosclerotic heart disease of native coronary artery with unstable angina pectoris: Secondary | ICD-10-CM | POA: Diagnosis not present

## 2021-11-14 DIAGNOSIS — K120 Recurrent oral aphthae: Secondary | ICD-10-CM

## 2021-11-14 DIAGNOSIS — I252 Old myocardial infarction: Secondary | ICD-10-CM

## 2021-11-14 HISTORY — PX: TEE WITHOUT CARDIOVERSION: SHX5443

## 2021-11-14 HISTORY — PX: CORONARY ARTERY BYPASS GRAFT: SHX141

## 2021-11-14 LAB — POCT I-STAT 7, (LYTES, BLD GAS, ICA,H+H)
Acid-Base Excess: 0 mmol/L (ref 0.0–2.0)
Acid-Base Excess: 2 mmol/L (ref 0.0–2.0)
Acid-Base Excess: 2 mmol/L (ref 0.0–2.0)
Acid-Base Excess: 0 mmol/L (ref 0.0–2.0)
Acid-Base Excess: 1 mmol/L (ref 0.0–2.0)
Acid-base deficit: 2 mmol/L (ref 0.0–2.0)
Acid-base deficit: 1 mmol/L (ref 0.0–2.0)
Acid-base deficit: 1 mmol/L (ref 0.0–2.0)
Bicarbonate: 26.1 mmol/L (ref 20.0–28.0)
Bicarbonate: 26.9 mmol/L (ref 20.0–28.0)
Bicarbonate: 26.8 mmol/L (ref 20.0–28.0)
Bicarbonate: 22.8 mmol/L (ref 20.0–28.0)
Bicarbonate: 23.4 mmol/L (ref 20.0–28.0)
Bicarbonate: 24.2 mmol/L (ref 20.0–28.0)
Bicarbonate: 25.9 mmol/L (ref 20.0–28.0)
Bicarbonate: 24.4 mmol/L (ref 20.0–28.0)
Calcium, Ion: 1.28 mmol/L (ref 1.15–1.40)
Calcium, Ion: 1.02 mmol/L — ABNORMAL LOW (ref 1.15–1.40)
Calcium, Ion: 1.06 mmol/L — ABNORMAL LOW (ref 1.15–1.40)
Calcium, Ion: 1.06 mmol/L — ABNORMAL LOW (ref 1.15–1.40)
Calcium, Ion: 1.09 mmol/L — ABNORMAL LOW (ref 1.15–1.40)
Calcium, Ion: 1.13 mmol/L — ABNORMAL LOW (ref 1.15–1.40)
Calcium, Ion: 1.17 mmol/L (ref 1.15–1.40)
Calcium, Ion: 1.14 mmol/L — ABNORMAL LOW (ref 1.15–1.40)
HCT: 31 % — ABNORMAL LOW (ref 36.0–46.0)
HCT: 24 % — ABNORMAL LOW (ref 36.0–46.0)
HCT: 25 % — ABNORMAL LOW (ref 36.0–46.0)
HCT: 23 % — ABNORMAL LOW (ref 36.0–46.0)
HCT: 25 % — ABNORMAL LOW (ref 36.0–46.0)
HCT: 22 % — ABNORMAL LOW (ref 36.0–46.0)
HCT: 21 % — ABNORMAL LOW (ref 36.0–46.0)
HCT: 22 % — ABNORMAL LOW (ref 36.0–46.0)
Hemoglobin: 10.5 g/dL — ABNORMAL LOW (ref 12.0–15.0)
Hemoglobin: 8.2 g/dL — ABNORMAL LOW (ref 12.0–15.0)
Hemoglobin: 8.5 g/dL — ABNORMAL LOW (ref 12.0–15.0)
Hemoglobin: 7.8 g/dL — ABNORMAL LOW (ref 12.0–15.0)
Hemoglobin: 8.5 g/dL — ABNORMAL LOW (ref 12.0–15.0)
Hemoglobin: 7.5 g/dL — ABNORMAL LOW (ref 12.0–15.0)
Hemoglobin: 7.1 g/dL — ABNORMAL LOW (ref 12.0–15.0)
Hemoglobin: 7.5 g/dL — ABNORMAL LOW (ref 12.0–15.0)
O2 Saturation: 100 %
O2 Saturation: 100 %
O2 Saturation: 100 %
O2 Saturation: 99 %
O2 Saturation: 98 %
O2 Saturation: 99 %
O2 Saturation: 99 %
O2 Saturation: 99 %
Patient temperature: 36.7
Potassium: 3.9 mmol/L (ref 3.5–5.1)
Potassium: 3.5 mmol/L (ref 3.5–5.1)
Potassium: 3.9 mmol/L (ref 3.5–5.1)
Potassium: 4 mmol/L (ref 3.5–5.1)
Potassium: 3.6 mmol/L (ref 3.5–5.1)
Potassium: 4.4 mmol/L (ref 3.5–5.1)
Potassium: 4.7 mmol/L (ref 3.5–5.1)
Potassium: 4.5 mmol/L (ref 3.5–5.1)
Sodium: 140 mmol/L (ref 135–145)
Sodium: 143 mmol/L (ref 135–145)
Sodium: 141 mmol/L (ref 135–145)
Sodium: 140 mmol/L (ref 135–145)
Sodium: 143 mmol/L (ref 135–145)
Sodium: 140 mmol/L (ref 135–145)
Sodium: 136 mmol/L (ref 135–145)
Sodium: 141 mmol/L (ref 135–145)
TCO2: 28 mmol/L (ref 22–32)
TCO2: 28 mmol/L (ref 22–32)
TCO2: 28 mmol/L (ref 22–32)
TCO2: 24 mmol/L (ref 22–32)
TCO2: 25 mmol/L (ref 22–32)
TCO2: 25 mmol/L (ref 22–32)
TCO2: 27 mmol/L (ref 22–32)
TCO2: 26 mmol/L (ref 22–32)
pCO2 arterial: 49.5 mmHg — ABNORMAL HIGH (ref 32–48)
pCO2 arterial: 39.9 mmHg (ref 32–48)
pCO2 arterial: 40.3 mmHg (ref 32–48)
pCO2 arterial: 38.6 mmHg (ref 32–48)
pCO2 arterial: 37 mmHg (ref 32–48)
pCO2 arterial: 36.9 mmHg (ref 32–48)
pCO2 arterial: 39.3 mmHg (ref 32–48)
pCO2 arterial: 43.8 mmHg (ref 32–48)
pH, Arterial: 7.33 — ABNORMAL LOW (ref 7.35–7.45)
pH, Arterial: 7.436 (ref 7.35–7.45)
pH, Arterial: 7.431 (ref 7.35–7.45)
pH, Arterial: 7.379 (ref 7.35–7.45)
pH, Arterial: 7.409 (ref 7.35–7.45)
pH, Arterial: 7.425 (ref 7.35–7.45)
pH, Arterial: 7.427 (ref 7.35–7.45)
pH, Arterial: 7.353 (ref 7.35–7.45)
pO2, Arterial: 191 mmHg — ABNORMAL HIGH (ref 83–108)
pO2, Arterial: 392 mmHg — ABNORMAL HIGH (ref 83–108)
pO2, Arterial: 374 mmHg — ABNORMAL HIGH (ref 83–108)
pO2, Arterial: 137 mmHg — ABNORMAL HIGH (ref 83–108)
pO2, Arterial: 101 mmHg (ref 83–108)
pO2, Arterial: 146 mmHg — ABNORMAL HIGH (ref 83–108)
pO2, Arterial: 138 mmHg — ABNORMAL HIGH (ref 83–108)
pO2, Arterial: 136 mmHg — ABNORMAL HIGH (ref 83–108)

## 2021-11-14 LAB — CBC
HCT: 27 % — ABNORMAL LOW (ref 36.0–46.0)
Hemoglobin: 9 g/dL — ABNORMAL LOW (ref 12.0–15.0)
MCH: 31 pg (ref 26.0–34.0)
MCHC: 33.3 g/dL (ref 30.0–36.0)
MCV: 93.1 fL (ref 80.0–100.0)
Platelets: 128 10*3/uL — ABNORMAL LOW (ref 150–400)
RBC: 2.9 MIL/uL — ABNORMAL LOW (ref 3.87–5.11)
RDW: 11.9 % (ref 11.5–15.5)
WBC: 7.4 10*3/uL (ref 4.0–10.5)
nRBC: 0 % (ref 0.0–0.2)

## 2021-11-14 LAB — POCT I-STAT, CHEM 8
BUN: 26 mg/dL — ABNORMAL HIGH (ref 8–23)
BUN: 23 mg/dL (ref 8–23)
BUN: 23 mg/dL (ref 8–23)
BUN: 20 mg/dL (ref 8–23)
BUN: 20 mg/dL (ref 8–23)
BUN: 20 mg/dL (ref 8–23)
Calcium, Ion: 1.21 mmol/L (ref 1.15–1.40)
Calcium, Ion: 1.04 mmol/L — ABNORMAL LOW (ref 1.15–1.40)
Calcium, Ion: 1.17 mmol/L (ref 1.15–1.40)
Calcium, Ion: 1.02 mmol/L — ABNORMAL LOW (ref 1.15–1.40)
Calcium, Ion: 1.06 mmol/L — ABNORMAL LOW (ref 1.15–1.40)
Calcium, Ion: 1.06 mmol/L — ABNORMAL LOW (ref 1.15–1.40)
Chloride: 103 mmol/L (ref 98–111)
Chloride: 105 mmol/L (ref 98–111)
Chloride: 108 mmol/L (ref 98–111)
Chloride: 103 mmol/L (ref 98–111)
Chloride: 103 mmol/L (ref 98–111)
Chloride: 104 mmol/L (ref 98–111)
Creatinine, Ser: 1 mg/dL (ref 0.44–1.00)
Creatinine, Ser: 0.8 mg/dL (ref 0.44–1.00)
Creatinine, Ser: 0.9 mg/dL (ref 0.44–1.00)
Creatinine, Ser: 0.7 mg/dL (ref 0.44–1.00)
Creatinine, Ser: 0.7 mg/dL (ref 0.44–1.00)
Creatinine, Ser: 0.7 mg/dL (ref 0.44–1.00)
Glucose, Bld: 135 mg/dL — ABNORMAL HIGH (ref 70–99)
Glucose, Bld: 155 mg/dL — ABNORMAL HIGH (ref 70–99)
Glucose, Bld: 68 mg/dL — ABNORMAL LOW (ref 70–99)
Glucose, Bld: 155 mg/dL — ABNORMAL HIGH (ref 70–99)
Glucose, Bld: 160 mg/dL — ABNORMAL HIGH (ref 70–99)
Glucose, Bld: 174 mg/dL — ABNORMAL HIGH (ref 70–99)
HCT: 33 % — ABNORMAL LOW (ref 36.0–46.0)
HCT: 25 % — ABNORMAL LOW (ref 36.0–46.0)
HCT: 30 % — ABNORMAL LOW (ref 36.0–46.0)
HCT: 24 % — ABNORMAL LOW (ref 36.0–46.0)
HCT: 24 % — ABNORMAL LOW (ref 36.0–46.0)
HCT: 25 % — ABNORMAL LOW (ref 36.0–46.0)
Hemoglobin: 11.2 g/dL — ABNORMAL LOW (ref 12.0–15.0)
Hemoglobin: 10.2 g/dL — ABNORMAL LOW (ref 12.0–15.0)
Hemoglobin: 8.5 g/dL — ABNORMAL LOW (ref 12.0–15.0)
Hemoglobin: 8.2 g/dL — ABNORMAL LOW (ref 12.0–15.0)
Hemoglobin: 8.2 g/dL — ABNORMAL LOW (ref 12.0–15.0)
Hemoglobin: 8.5 g/dL — ABNORMAL LOW (ref 12.0–15.0)
Potassium: 3.9 mmol/L (ref 3.5–5.1)
Potassium: 3.4 mmol/L — ABNORMAL LOW (ref 3.5–5.1)
Potassium: 3.9 mmol/L (ref 3.5–5.1)
Potassium: 4.4 mmol/L (ref 3.5–5.1)
Potassium: 3.9 mmol/L (ref 3.5–5.1)
Potassium: 4 mmol/L (ref 3.5–5.1)
Sodium: 140 mmol/L (ref 135–145)
Sodium: 142 mmol/L (ref 135–145)
Sodium: 143 mmol/L (ref 135–145)
Sodium: 138 mmol/L (ref 135–145)
Sodium: 140 mmol/L (ref 135–145)
Sodium: 140 mmol/L (ref 135–145)
TCO2: 25 mmol/L (ref 22–32)
TCO2: 25 mmol/L (ref 22–32)
TCO2: 27 mmol/L (ref 22–32)
TCO2: 26 mmol/L (ref 22–32)
TCO2: 20 mmol/L — ABNORMAL LOW (ref 22–32)
TCO2: 24 mmol/L (ref 22–32)

## 2021-11-14 LAB — ECHO INTRAOPERATIVE TEE
AV Mean grad: 6 mmHg
AV Peak grad: 8.6 mmHg
Ao pk vel: 1.47 m/s
Height: 68 in
Weight: 2816 oz

## 2021-11-14 LAB — BASIC METABOLIC PANEL
Anion gap: 7 (ref 5–15)
BUN: 18 mg/dL (ref 8–23)
CO2: 22 mmol/L (ref 22–32)
Calcium: 7.8 mg/dL — ABNORMAL LOW (ref 8.9–10.3)
Chloride: 111 mmol/L (ref 98–111)
Creatinine, Ser: 0.83 mg/dL (ref 0.44–1.00)
GFR, Estimated: >60 mL/min (ref >60)
Glucose, Bld: 147 mg/dL — ABNORMAL HIGH (ref 70–99)
Potassium: 4.5 mmol/L (ref 3.5–5.1)
Sodium: 140 mmol/L (ref 135–145)

## 2021-11-14 LAB — CBC WITH DIFFERENTIAL/PLATELET
Abs Immature Granulocytes: 0.02 10*3/uL (ref 0.00–0.07)
Basophils Absolute: 0 10*3/uL (ref 0.0–0.1)
Basophils Relative: 0 %
Eosinophils Absolute: 0 10*3/uL (ref 0.0–0.5)
Eosinophils Relative: 0 %
HCT: 26 % — ABNORMAL LOW (ref 36.0–46.0)
Hemoglobin: 8.8 g/dL — ABNORMAL LOW (ref 12.0–15.0)
Immature Granulocytes: 0 %
Lymphocytes Relative: 9 %
Lymphs Abs: 0.7 10*3/uL (ref 0.7–4.0)
MCH: 31.7 pg (ref 26.0–34.0)
MCHC: 33.8 g/dL (ref 30.0–36.0)
MCV: 93.5 fL (ref 80.0–100.0)
Monocytes Absolute: 2.3 10*3/uL — ABNORMAL HIGH (ref 0.1–1.0)
Monocytes Relative: 33 %
Neutro Abs: 4.2 10*3/uL (ref 1.7–7.7)
Neutrophils Relative %: 58 %
Platelets: 104 10*3/uL — ABNORMAL LOW (ref 150–400)
RBC: 2.78 MIL/uL — ABNORMAL LOW (ref 3.87–5.11)
RDW: 11.9 % (ref 11.5–15.5)
WBC: 7.2 10*3/uL (ref 4.0–10.5)
nRBC: 0 % (ref 0.0–0.2)

## 2021-11-14 LAB — POCT I-STAT EG7
Acid-Base Excess: 0 mmol/L (ref 0.0–2.0)
Bicarbonate: 25 mmol/L (ref 20.0–28.0)
Calcium, Ion: 1.08 mmol/L — ABNORMAL LOW (ref 1.15–1.40)
HCT: 25 % — ABNORMAL LOW (ref 36.0–46.0)
Hemoglobin: 8.5 g/dL — ABNORMAL LOW (ref 12.0–15.0)
O2 Saturation: 79 %
Potassium: 3.8 mmol/L (ref 3.5–5.1)
Sodium: 144 mmol/L (ref 135–145)
TCO2: 26 mmol/L (ref 22–32)
pCO2, Ven: 40.4 mmHg — ABNORMAL LOW (ref 44–60)
pH, Ven: 7.399 (ref 7.25–7.43)
pO2, Ven: 43 mmHg (ref 32–45)

## 2021-11-14 LAB — GLUCOSE, CAPILLARY
Glucose-Capillary: 110 mg/dL — ABNORMAL HIGH (ref 70–99)
Glucose-Capillary: 124 mg/dL — ABNORMAL HIGH (ref 70–99)
Glucose-Capillary: 138 mg/dL — ABNORMAL HIGH (ref 70–99)
Glucose-Capillary: 121 mg/dL — ABNORMAL HIGH (ref 70–99)
Glucose-Capillary: 119 mg/dL — ABNORMAL HIGH (ref 70–99)
Glucose-Capillary: 143 mg/dL — ABNORMAL HIGH (ref 70–99)
Glucose-Capillary: 107 mg/dL — ABNORMAL HIGH (ref 70–99)
Glucose-Capillary: 134 mg/dL — ABNORMAL HIGH (ref 70–99)

## 2021-11-14 LAB — HEMOGLOBIN AND HEMATOCRIT, BLOOD
HCT: 24.9 % — ABNORMAL LOW (ref 36.0–46.0)
Hemoglobin: 8.5 g/dL — ABNORMAL LOW (ref 12.0–15.0)

## 2021-11-14 LAB — MAGNESIUM: Magnesium: 2.8 mg/dL — ABNORMAL HIGH (ref 1.7–2.4)

## 2021-11-14 LAB — PROTIME-INR
INR: 1.4 — ABNORMAL HIGH (ref 0.8–1.2)
Prothrombin Time: 16.9 seconds — ABNORMAL HIGH (ref 11.4–15.2)

## 2021-11-14 LAB — PLATELET COUNT: Platelets: 134 10*3/uL — ABNORMAL LOW (ref 150–400)

## 2021-11-14 LAB — APTT: aPTT: 26 seconds (ref 24–36)

## 2021-11-14 LAB — PREPARE RBC (CROSSMATCH)

## 2021-11-14 SURGERY — CORONARY ARTERY BYPASS GRAFTING (CABG)
Anesthesia: General | Site: Chest

## 2021-11-14 MED ORDER — SODIUM CHLORIDE (PF) 0.9 % IJ SOLN
OROMUCOSAL | Status: DC | PRN
  Administered 2021-11-14 (×3): 4 mL via TOPICAL

## 2021-11-14 MED ORDER — LACTATED RINGERS IV SOLN: INTRAVENOUS | Status: DC | PRN

## 2021-11-14 MED ORDER — SODIUM CHLORIDE 0.9 % IV SOLN
INTRAVENOUS | Status: DC
  Administered 2021-11-15: 10 mL/h via INTRAVENOUS

## 2021-11-14 MED ORDER — ORAL CARE MOUTH RINSE
15.0000 mL | OROMUCOSAL | Status: DC
  Administered 2021-11-14 (×2): 15 mL via OROMUCOSAL

## 2021-11-14 MED ORDER — MIDAZOLAM HCL 2 MG/2ML IJ SOLN: 2.0000 mg | INTRAMUSCULAR | Status: DC | PRN

## 2021-11-14 MED ORDER — NITROGLYCERIN IN D5W 200-5 MCG/ML-% IV SOLN: 0.0000 ug/min | INTRAVENOUS | Status: DC

## 2021-11-14 MED ORDER — SURGIFLO WITH THROMBIN (HEMOSTATIC MATRIX KIT) OPTIME
TOPICAL | Status: DC | PRN
  Administered 2021-11-14: 1 via TOPICAL

## 2021-11-14 MED ORDER — ACETAMINOPHEN 650 MG RE SUPP
650.0000 mg | Freq: Once | RECTAL | Status: AC
Start: 2021-11-14 — End: 2021-11-14

## 2021-11-14 MED ORDER — ACETAMINOPHEN 160 MG/5ML PO SOLN
650.0000 mg | Freq: Once | ORAL | Status: AC
  Administered 2021-11-14: 650 mg
  Filled 2021-11-14: qty 20.3

## 2021-11-14 MED ORDER — SODIUM CHLORIDE 0.45 % IV SOLN: INTRAVENOUS | Status: DC | PRN

## 2021-11-14 MED ORDER — MIDAZOLAM HCL (PF) 5 MG/ML IJ SOLN
INTRAMUSCULAR | Status: DC | PRN
  Administered 2021-11-14: 2 mg via INTRAVENOUS
  Administered 2021-11-14: 10 mg via INTRAVENOUS
  Administered 2021-11-14 (×4): 2 mg via INTRAVENOUS

## 2021-11-14 MED ORDER — METOPROLOL TARTRATE 12.5 MG HALF TABLET
12.5000 mg | ORAL_TABLET | Freq: Two times a day (BID) | ORAL | Status: DC
  Administered 2021-11-15 – 2021-11-20 (×11): 12.5 mg via ORAL
  Filled 2021-11-14 (×14): qty 1

## 2021-11-14 MED ORDER — SODIUM CHLORIDE (PF) 0.9 % IJ SOLN
INTRAMUSCULAR | Status: AC
  Filled 2021-11-14: qty 10

## 2021-11-14 MED ORDER — ARTIFICIAL TEARS OPHTHALMIC OINT
TOPICAL_OINTMENT | OPHTHALMIC | Status: DC | PRN
  Administered 2021-11-14: 1 via OPHTHALMIC

## 2021-11-14 MED ORDER — LACTATED RINGERS IV SOLN: INTRAVENOUS | Status: DC

## 2021-11-14 MED ORDER — PHENYLEPHRINE HCL-NACL 20-0.9 MG/250ML-% IV SOLN: 0.0000 ug/min | INTRAVENOUS | Status: DC

## 2021-11-14 MED ORDER — SODIUM CHLORIDE 0.9% IV SOLUTION: Freq: Once | INTRAVENOUS | Status: DC

## 2021-11-14 MED ORDER — NOREPINEPHRINE 4 MG/250ML-% IV SOLN: 0.0000 ug/min | INTRAVENOUS | Status: DC

## 2021-11-14 MED ORDER — 0.9 % SODIUM CHLORIDE (POUR BTL) OPTIME
TOPICAL | Status: DC | PRN
  Administered 2021-11-14: 5000 mL
  Administered 2021-11-14: 4000 mL

## 2021-11-14 MED ORDER — POTASSIUM CHLORIDE 10 MEQ/50ML IV SOLN
10.0000 meq | INTRAVENOUS | Status: AC
  Administered 2021-11-14 (×3): 10 meq via INTRAVENOUS

## 2021-11-14 MED ORDER — DEXAMETHASONE SODIUM PHOSPHATE 10 MG/ML IJ SOLN
INTRAMUSCULAR | Status: DC | PRN
  Administered 2021-11-14: 4 mg via INTRAVENOUS

## 2021-11-14 MED ORDER — CHLORHEXIDINE GLUCONATE 0.12 % MT SOLN
15.0000 mL | OROMUCOSAL | Status: AC
  Administered 2021-11-14: 15 mL via OROMUCOSAL
  Filled 2021-11-14: qty 15

## 2021-11-14 MED ORDER — BISACODYL 5 MG PO TBEC
10.0000 mg | DELAYED_RELEASE_TABLET | Freq: Every day | ORAL | Status: DC
  Administered 2021-11-15 – 2021-11-20 (×4): 10 mg via ORAL
  Filled 2021-11-14 (×5): qty 2

## 2021-11-14 MED ORDER — MAGNESIUM SULFATE 4 GM/100ML IV SOLN
4.0000 g | Freq: Once | INTRAVENOUS | Status: AC
Start: 2021-11-14 — End: 2021-11-14
  Administered 2021-11-14: 4 g via INTRAVENOUS
  Filled 2021-11-14: qty 100

## 2021-11-14 MED ORDER — ACETAMINOPHEN 500 MG PO TABS
1000.0000 mg | ORAL_TABLET | Freq: Once | ORAL | Status: AC
  Administered 2021-11-14: 1000 mg via ORAL
  Filled 2021-11-14: qty 2

## 2021-11-14 MED ORDER — ALBUMIN HUMAN 5 % IV SOLN
250.0000 mL | INTRAVENOUS | Status: AC | PRN
  Administered 2021-11-14 (×2): 12.5 g via INTRAVENOUS

## 2021-11-14 MED ORDER — MIDAZOLAM HCL (PF) 10 MG/2ML IJ SOLN
INTRAMUSCULAR | Status: AC
  Filled 2021-11-14: qty 2

## 2021-11-14 MED ORDER — ONDANSETRON HCL 4 MG/2ML IJ SOLN
INTRAMUSCULAR | Status: DC | PRN
  Administered 2021-11-14: 4 mg via INTRAVENOUS

## 2021-11-14 MED ORDER — MILRINONE LACTATE IN DEXTROSE 20-5 MG/100ML-% IV SOLN
INTRAVENOUS | Status: DC | PRN
  Administered 2021-11-14: .25 ug/kg/min via INTRAVENOUS

## 2021-11-14 MED ORDER — VANCOMYCIN HCL IN DEXTROSE 1-5 GM/200ML-% IV SOLN
1000.0000 mg | Freq: Once | INTRAVENOUS | Status: AC
Start: 2021-11-14 — End: 2021-11-14
  Administered 2021-11-14: 1000 mg via INTRAVENOUS
  Filled 2021-11-14: qty 200

## 2021-11-14 MED ORDER — METOPROLOL TARTRATE 25 MG/10 ML ORAL SUSPENSION
12.5000 mg | Freq: Two times a day (BID) | ORAL | Status: DC
  Administered 2021-11-19: 12.5 mg
  Filled 2021-11-14 (×3): qty 5
  Filled 2021-11-14: qty 10
  Filled 2021-11-14 (×4): qty 5

## 2021-11-14 MED ORDER — DOCUSATE SODIUM 100 MG PO CAPS
200.0000 mg | ORAL_CAPSULE | Freq: Every day | ORAL | Status: DC
  Administered 2021-11-15 – 2021-11-20 (×6): 200 mg via ORAL
  Filled 2021-11-14 (×8): qty 2

## 2021-11-14 MED ORDER — ARTIFICIAL TEARS OPHTHALMIC OINT
TOPICAL_OINTMENT | OPHTHALMIC | Status: AC
  Filled 2021-11-14: qty 3.5

## 2021-11-14 MED ORDER — PHENYLEPHRINE 80 MCG/ML (10ML) SYRINGE FOR IV PUSH (FOR BLOOD PRESSURE SUPPORT)
PREFILLED_SYRINGE | INTRAVENOUS | Status: DC | PRN
  Administered 2021-11-14: 80 ug via INTRAVENOUS
  Administered 2021-11-14: 40 ug via INTRAVENOUS

## 2021-11-14 MED ORDER — ASPIRIN 325 MG PO TBEC
325.0000 mg | DELAYED_RELEASE_TABLET | Freq: Every day | ORAL | Status: DC
  Administered 2021-11-15 – 2021-11-18 (×4): 325 mg via ORAL
  Filled 2021-11-14 (×4): qty 1

## 2021-11-14 MED ORDER — OXYCODONE HCL 5 MG PO TABS
5.0000 mg | ORAL_TABLET | ORAL | Status: DC | PRN
  Administered 2021-11-14 – 2021-11-15 (×3): 10 mg via ORAL
  Filled 2021-11-14 (×3): qty 2

## 2021-11-14 MED ORDER — DESMOPRESSIN ACETATE 4 MCG/ML IJ SOLN
20.0000 ug | INTRAMUSCULAR | Status: AC
Start: 2021-11-14 — End: 2021-11-14
  Administered 2021-11-14: 20 ug via INTRAVENOUS
  Filled 2021-11-14: qty 5

## 2021-11-14 MED ORDER — FENTANYL CITRATE (PF) 250 MCG/5ML IJ SOLN
INTRAMUSCULAR | Status: AC
  Filled 2021-11-14: qty 5

## 2021-11-14 MED ORDER — PLASMA-LYTE A IV SOLN
INTRAVENOUS | Status: DC | PRN
  Administered 2021-11-14: 500 mL

## 2021-11-14 MED ORDER — METOPROLOL TARTRATE 12.5 MG HALF TABLET: 12.5000 mg | ORAL_TABLET | Freq: Once | ORAL | Status: DC

## 2021-11-14 MED ORDER — ACETAMINOPHEN 500 MG PO TABS
1000.0000 mg | ORAL_TABLET | Freq: Four times a day (QID) | ORAL | Status: AC
  Administered 2021-11-15 – 2021-11-19 (×18): 1000 mg via ORAL
  Filled 2021-11-14 (×19): qty 2

## 2021-11-14 MED ORDER — HEPARIN SODIUM (PORCINE) 1000 UNIT/ML IJ SOLN
INTRAMUSCULAR | Status: DC | PRN
  Administered 2021-11-14: 25000 [IU] via INTRAVENOUS
  Administered 2021-11-14: 3000 [IU] via INTRAVENOUS

## 2021-11-14 MED ORDER — DEXTROSE 50 % IV SOLN
INTRAVENOUS | Status: AC
  Filled 2021-11-14: qty 50

## 2021-11-14 MED ORDER — CHLORHEXIDINE GLUCONATE CLOTH 2 % EX PADS
6.0000 | MEDICATED_PAD | Freq: Every day | CUTANEOUS | Status: DC
  Administered 2021-11-14 – 2021-11-19 (×5): 6 via TOPICAL

## 2021-11-14 MED ORDER — MILRINONE LACTATE IN DEXTROSE 20-5 MG/100ML-% IV SOLN
0.1250 ug/kg/min | INTRAVENOUS | Status: DC
  Administered 2021-11-15: 0.25 ug/kg/min via INTRAVENOUS
  Administered 2021-11-16: 0.125 ug/kg/min via INTRAVENOUS
  Filled 2021-11-14: qty 100

## 2021-11-14 MED ORDER — EPHEDRINE SULFATE-NACL 50-0.9 MG/10ML-% IV SOSY
PREFILLED_SYRINGE | INTRAVENOUS | Status: DC | PRN
  Administered 2021-11-14: 5 mg via INTRAVENOUS

## 2021-11-14 MED ORDER — CEFAZOLIN SODIUM-DEXTROSE 2-4 GM/100ML-% IV SOLN
2.0000 g | Freq: Three times a day (TID) | INTRAVENOUS | Status: AC
  Administered 2021-11-14 – 2021-11-16 (×6): 2 g via INTRAVENOUS
  Filled 2021-11-14 (×6): qty 100

## 2021-11-14 MED ORDER — PROTAMINE SULFATE 10 MG/ML IV SOLN
INTRAVENOUS | Status: DC | PRN
  Administered 2021-11-14: 280 mg via INTRAVENOUS

## 2021-11-14 MED ORDER — ROCURONIUM BROMIDE 10 MG/ML (PF) SYRINGE
PREFILLED_SYRINGE | INTRAVENOUS | Status: DC | PRN
  Administered 2021-11-14: 50 mg via INTRAVENOUS
  Administered 2021-11-14: 20 mg via INTRAVENOUS
  Administered 2021-11-14: 30 mg via INTRAVENOUS
  Administered 2021-11-14: 80 mg via INTRAVENOUS
  Administered 2021-11-14: 70 mg via INTRAVENOUS
  Administered 2021-11-14: 50 mg via INTRAVENOUS

## 2021-11-14 MED ORDER — ORAL CARE MOUTH RINSE
15.0000 mL | OROMUCOSAL | Status: DC | PRN
Start: 2021-11-14 — End: 2021-11-22

## 2021-11-14 MED ORDER — SODIUM CHLORIDE 0.9 % IV SOLN: INTRAVENOUS | Status: DC | PRN

## 2021-11-14 MED ORDER — FAMOTIDINE IN NACL 20-0.9 MG/50ML-% IV SOLN
20.0000 mg | Freq: Two times a day (BID) | INTRAVENOUS | Status: AC
  Administered 2021-11-14 (×2): 20 mg via INTRAVENOUS
  Filled 2021-11-14 (×2): qty 50

## 2021-11-14 MED ORDER — SODIUM CHLORIDE 0.9% FLUSH
3.0000 mL | Freq: Two times a day (BID) | INTRAVENOUS | Status: DC
  Administered 2021-11-15 – 2021-11-22 (×9): 3 mL via INTRAVENOUS

## 2021-11-14 MED ORDER — ~~LOC~~ CARDIAC SURGERY, PATIENT & FAMILY EDUCATION
Freq: Once | Status: DC
  Filled 2021-11-14: qty 1

## 2021-11-14 MED ORDER — DEXMEDETOMIDINE HCL IN NACL 400 MCG/100ML IV SOLN
0.0000 ug/kg/h | INTRAVENOUS | Status: DC
  Administered 2021-11-14: 0.2 ug/kg/h via INTRAVENOUS

## 2021-11-14 MED ORDER — SODIUM CHLORIDE 0.9% FLUSH: 10.0000 mL | INTRAVENOUS | Status: DC | PRN

## 2021-11-14 MED ORDER — FENTANYL CITRATE PF 50 MCG/ML IJ SOSY
50.0000 ug | PREFILLED_SYRINGE | INTRAMUSCULAR | Status: DC | PRN
  Administered 2021-11-14 – 2021-11-15 (×6): 50 ug via INTRAVENOUS
  Filled 2021-11-14 (×6): qty 1

## 2021-11-14 MED ORDER — ACETAMINOPHEN 160 MG/5ML PO SOLN
1000.0000 mg | Freq: Four times a day (QID) | ORAL | Status: AC
  Filled 2021-11-14: qty 40.6

## 2021-11-14 MED ORDER — BISACODYL 10 MG RE SUPP
10.0000 mg | Freq: Every day | RECTAL | Status: DC
  Filled 2021-11-14: qty 1

## 2021-11-14 MED ORDER — SODIUM CHLORIDE 0.9% FLUSH
10.0000 mL | Freq: Two times a day (BID) | INTRAVENOUS | Status: DC
  Administered 2021-11-14 – 2021-11-17 (×6): 10 mL

## 2021-11-14 MED ORDER — SODIUM CHLORIDE 0.9 % IR SOLN
Status: DC | PRN
  Administered 2021-11-14: 1000 mL

## 2021-11-14 MED ORDER — PROPOFOL 10 MG/ML IV BOLUS
INTRAVENOUS | Status: AC
  Filled 2021-11-14: qty 20

## 2021-11-14 MED ORDER — SODIUM CHLORIDE 0.9 % IV SOLN: 250.0000 mL | INTRAVENOUS | Status: DC

## 2021-11-14 MED ORDER — METOPROLOL TARTRATE 5 MG/5ML IV SOLN: 2.5000 mg | INTRAVENOUS | Status: DC | PRN

## 2021-11-14 MED ORDER — PANTOPRAZOLE SODIUM 40 MG PO TBEC
40.0000 mg | DELAYED_RELEASE_TABLET | Freq: Every day | ORAL | Status: DC
  Administered 2021-11-16 – 2021-11-22 (×7): 40 mg via ORAL
  Filled 2021-11-14 (×7): qty 1

## 2021-11-14 MED ORDER — INSULIN REGULAR(HUMAN) IN NACL 100-0.9 UT/100ML-% IV SOLN: INTRAVENOUS | Status: DC

## 2021-11-14 MED ORDER — FENTANYL CITRATE (PF) 250 MCG/5ML IJ SOLN
INTRAMUSCULAR | Status: DC | PRN
  Administered 2021-11-14: 100 ug via INTRAVENOUS
  Administered 2021-11-14: 550 ug via INTRAVENOUS
  Administered 2021-11-14: 50 ug via INTRAVENOUS
  Administered 2021-11-14 (×2): 100 ug via INTRAVENOUS
  Administered 2021-11-14: 50 ug via INTRAVENOUS
  Administered 2021-11-14: 250 ug via INTRAVENOUS
  Administered 2021-11-14: 50 ug via INTRAVENOUS

## 2021-11-14 MED ORDER — CHLORHEXIDINE GLUCONATE 4 % EX LIQD: 30.0000 mL | CUTANEOUS | Status: DC

## 2021-11-14 MED ORDER — ONDANSETRON HCL 4 MG/2ML IJ SOLN
4.0000 mg | Freq: Four times a day (QID) | INTRAMUSCULAR | Status: DC | PRN
  Administered 2021-11-15 – 2021-11-17 (×3): 4 mg via INTRAVENOUS
  Filled 2021-11-14 (×4): qty 2

## 2021-11-14 MED ORDER — SODIUM CHLORIDE 0.9% FLUSH: 3.0000 mL | INTRAVENOUS | Status: DC | PRN

## 2021-11-14 MED ORDER — DEXTROSE 50 % IV SOLN: 0.0000 mL | INTRAVENOUS | Status: DC | PRN

## 2021-11-14 MED ORDER — LACTATED RINGERS IV SOLN: 500.0000 mL | Freq: Once | INTRAVENOUS | Status: DC | PRN

## 2021-11-14 MED ORDER — ROCURONIUM BROMIDE 10 MG/ML (PF) SYRINGE
PREFILLED_SYRINGE | INTRAVENOUS | Status: AC
  Filled 2021-11-14: qty 30

## 2021-11-14 MED ORDER — TRAMADOL HCL 50 MG PO TABS
50.0000 mg | ORAL_TABLET | ORAL | Status: DC | PRN
  Administered 2021-11-17 – 2021-11-19 (×6): 100 mg via ORAL
  Administered 2021-11-20 – 2021-11-21 (×4): 50 mg via ORAL
  Filled 2021-11-14 (×4): qty 2
  Filled 2021-11-14 (×2): qty 1
  Filled 2021-11-14: qty 2
  Filled 2021-11-14: qty 1
  Filled 2021-11-14 (×2): qty 2

## 2021-11-14 MED ORDER — PROPOFOL 10 MG/ML IV BOLUS
INTRAVENOUS | Status: DC | PRN
  Administered 2021-11-14: 80 mg via INTRAVENOUS

## 2021-11-14 MED ORDER — ASPIRIN 81 MG PO CHEW
324.0000 mg | CHEWABLE_TABLET | Freq: Every day | ORAL | Status: DC
  Filled 2021-11-14: qty 4

## 2021-11-14 MED ORDER — DEXTROSE 50 % IV SOLN
INTRAVENOUS | Status: DC | PRN
  Administered 2021-11-14: 35 mL via INTRAVENOUS

## 2021-11-14 MED ORDER — HEPARIN SODIUM (PORCINE) 1000 UNIT/ML IJ SOLN
INTRAMUSCULAR | Status: AC
  Filled 2021-11-14: qty 1

## 2021-11-14 MED ORDER — CHLORHEXIDINE GLUCONATE 0.12 % MT SOLN
15.0000 mL | Freq: Once | OROMUCOSAL | Status: AC
  Administered 2021-11-14: 15 mL via OROMUCOSAL
  Filled 2021-11-14: qty 15

## 2021-11-14 SURGICAL SUPPLY — 128 items
ADAPTER CARDIO PERF ANTE/RETRO (ADAPTER) ×4 IMPLANT
ADH SKN CLS APL DERMABOND .7 (GAUZE/BANDAGES/DRESSINGS) ×2
ADPR PRFSN 84XANTGRD RTRGD (ADAPTER) ×4
AGENT HMST KT MTR STRL THRMB (HEMOSTASIS) ×2
BAG DECANTER FOR FLEXI CONT (MISCELLANEOUS) ×3 IMPLANT
BINDER BREAST XLRG (GAUZE/BANDAGES/DRESSINGS) ×1 IMPLANT
BLADE CLIPPER SURG (BLADE) ×2 IMPLANT
BLADE NDL 3 SS STRL (BLADE) IMPLANT
BLADE NEEDLE 3 SS STRL (BLADE) ×3 IMPLANT
BLADE STERNUM SYSTEM 6 (BLADE) ×3 IMPLANT
BLADE SURG 12 STRL SS (BLADE) ×3 IMPLANT
BNDG CMPR MED 10X6 ELC LF (GAUZE/BANDAGES/DRESSINGS) ×2
BNDG ELASTIC 4X5.8 VLCR STR LF (GAUZE/BANDAGES/DRESSINGS) ×3 IMPLANT
BNDG ELASTIC 6X10 VLCR STRL LF (GAUZE/BANDAGES/DRESSINGS) ×1 IMPLANT
BNDG ELASTIC 6X5.8 VLCR STR LF (GAUZE/BANDAGES/DRESSINGS) ×3 IMPLANT
BNDG GAUZE 4.5X4.1 6PLY STRL (MISCELLANEOUS) ×1 IMPLANT
BNDG GAUZE ELAST 4 BULKY (GAUZE/BANDAGES/DRESSINGS) ×3 IMPLANT
CANISTER SUCT 3000ML PPV (MISCELLANEOUS) ×3 IMPLANT
CANNULA GUNDRY RCSP 15FR (MISCELLANEOUS) ×3 IMPLANT
CATH CPB KIT VANTRIGT (MISCELLANEOUS) ×3 IMPLANT
CATH ROBINSON RED A/P 18FR (CATHETERS) ×9 IMPLANT
CATH THORACIC 28FR RT ANG (CATHETERS) ×3 IMPLANT
CNTNR URN SCR LID CUP LEK RST (MISCELLANEOUS) IMPLANT
CONT SPEC 4OZ STRL OR WHT (MISCELLANEOUS) ×3
CONTAINER PROTECT SURGISLUSH (MISCELLANEOUS) ×7 IMPLANT
CRYO VEIN SAPHENOUS (Tissue) ×3 IMPLANT
DERMABOND ADVANCED (GAUZE/BANDAGES/DRESSINGS) ×3
DERMABOND ADVANCED .7 DNX12 (GAUZE/BANDAGES/DRESSINGS) IMPLANT
DRAIN CHANNEL 32F RND 10.7 FF (WOUND CARE) ×3 IMPLANT
DRAPE CARDIOVASCULAR INCISE (DRAPES) ×3
DRAPE HALF SHEET 40X57 (DRAPES) ×1 IMPLANT
DRAPE SLUSH/WARMER DISC (DRAPES) ×3 IMPLANT
DRAPE SRG 135X102X78XABS (DRAPES) ×2 IMPLANT
DRSG AQUACEL AG ADV 3.5X14 (GAUZE/BANDAGES/DRESSINGS) ×3 IMPLANT
ELECT BLADE 4.0 EZ CLEAN MEGAD (MISCELLANEOUS) ×3
ELECT BLADE 6.5 EXT (BLADE) ×3 IMPLANT
ELECT CAUTERY BLADE 6.4 (BLADE) ×3 IMPLANT
ELECT REM PT RETURN 9FT ADLT (ELECTROSURGICAL) ×6
ELECTRODE BLDE 4.0 EZ CLN MEGD (MISCELLANEOUS) ×2 IMPLANT
ELECTRODE REM PT RTRN 9FT ADLT (ELECTROSURGICAL) ×4 IMPLANT
FELT TEFLON 1X6 (MISCELLANEOUS) ×6 IMPLANT
GAUZE 4X4 16PLY ~~LOC~~+RFID DBL (SPONGE) ×3 IMPLANT
GAUZE SPONGE 4X4 12PLY STRL (GAUZE/BANDAGES/DRESSINGS) ×5 IMPLANT
GAUZE SPONGE 4X4 12PLY STRL LF (GAUZE/BANDAGES/DRESSINGS) ×1 IMPLANT
GLOVE BIO SURGEON STRL SZ 6.5 (GLOVE) ×1 IMPLANT
GLOVE BIO SURGEON STRL SZ7.5 (GLOVE) ×11 IMPLANT
GLOVE BIOGEL PI IND STRL 6.5 (GLOVE) IMPLANT
GLOVE BIOGEL PI IND STRL 7.0 (GLOVE) IMPLANT
GLOVE BIOGEL PI IND STRL 7.5 (GLOVE) IMPLANT
GLOVE BIOGEL PI INDICATOR 6.5 (GLOVE) ×3
GLOVE BIOGEL PI INDICATOR 7.0 (GLOVE) ×3
GLOVE BIOGEL PI INDICATOR 7.5 (GLOVE) ×6
GLOVE SS BIOGEL STRL SZ 6 (GLOVE) IMPLANT
GLOVE SS BIOGEL STRL SZ 7.5 (GLOVE) IMPLANT
GLOVE SUPERSENSE BIOGEL SZ 6 (GLOVE) ×3
GLOVE SUPERSENSE BIOGEL SZ 7.5 (GLOVE) ×3
GLOVE SURG SS PI 7.5 STRL IVOR (GLOVE) ×1 IMPLANT
GOWN STRL REUS W/ TWL LRG LVL3 (GOWN DISPOSABLE) ×8 IMPLANT
GOWN STRL REUS W/ TWL LRG LVL4 (GOWN DISPOSABLE) IMPLANT
GOWN STRL REUS W/TWL LRG LVL3 (GOWN DISPOSABLE) ×21
GOWN STRL REUS W/TWL LRG LVL4 (GOWN DISPOSABLE) ×12
GRAFT VASC ANGIOGRAFT 71-80 (Tissue) IMPLANT
HEMOSTAT POWDER SURGIFOAM 1G (HEMOSTASIS) ×9 IMPLANT
HEMOSTAT SURGICEL 2X14 (HEMOSTASIS) ×3 IMPLANT
INSERT FOGARTY XLG (MISCELLANEOUS) IMPLANT
KIT BASIN OR (CUSTOM PROCEDURE TRAY) ×3 IMPLANT
KIT SUCTION CATH 14FR (SUCTIONS) ×3 IMPLANT
KIT TURNOVER KIT B (KITS) ×3 IMPLANT
KIT VASOVIEW HEMOPRO 2 VH 4000 (KITS) ×3 IMPLANT
LEAD PACING MYOCARDI (MISCELLANEOUS) ×3 IMPLANT
LINE SUCTION ATS CATS PLUS (LINER) ×1 IMPLANT
MARKER GRAFT CORONARY BYPASS (MISCELLANEOUS) ×9 IMPLANT
NDL SUT 1 .5 CRC FRENCH EYE (NEEDLE) IMPLANT
NEEDLE FRENCH EYE (NEEDLE) ×3
NS IRRIG 1000ML POUR BTL (IV SOLUTION) ×9 IMPLANT
PACK E OPEN HEART (SUTURE) ×3 IMPLANT
PACK OPEN HEART (CUSTOM PROCEDURE TRAY) ×3 IMPLANT
PAD ARMBOARD 7.5X6 YLW CONV (MISCELLANEOUS) ×6 IMPLANT
PAD ELECT DEFIB RADIOL ZOLL (MISCELLANEOUS) ×3 IMPLANT
PENCIL BUTTON HOLSTER BLD 10FT (ELECTRODE) ×3 IMPLANT
POSITIONER HEAD DONUT 9IN (MISCELLANEOUS) ×3 IMPLANT
POWDER SURGICEL 3.0 GRAM (HEMOSTASIS) ×3 IMPLANT
PUNCH AORTIC ROTATE 4.0MM (MISCELLANEOUS) IMPLANT
PUNCH AORTIC ROTATE 4.5MM 8IN (MISCELLANEOUS) ×1 IMPLANT
PUNCH AORTIC ROTATE 5MM 8IN (MISCELLANEOUS) IMPLANT
SET MPS 3-ND DEL (MISCELLANEOUS) ×1 IMPLANT
SPONGE T-LAP 18X18 ~~LOC~~+RFID (SPONGE) ×14 IMPLANT
SPONGE T-LAP 4X18 ~~LOC~~+RFID (SPONGE) ×4 IMPLANT
SUPPORT HEART JANKE-BARRON (MISCELLANEOUS) ×3 IMPLANT
SURGIFLO W/THROMBIN 8M KIT (HEMOSTASIS) ×3 IMPLANT
SUT BONE WAX W31G (SUTURE) ×3 IMPLANT
SUT MNCRL AB 4-0 PS2 18 (SUTURE) IMPLANT
SUT PROLENE 3 0 SH DA (SUTURE) IMPLANT
SUT PROLENE 3 0 SH1 36 (SUTURE) IMPLANT
SUT PROLENE 4 0 RB 1 (SUTURE) ×6
SUT PROLENE 4 0 SH DA (SUTURE) ×3 IMPLANT
SUT PROLENE 4-0 RB1 .5 CRCL 36 (SUTURE) ×2 IMPLANT
SUT PROLENE 5 0 C 1 36 (SUTURE) ×1 IMPLANT
SUT PROLENE 6 0 C 1 30 (SUTURE) ×2 IMPLANT
SUT PROLENE 6 0 CC (SUTURE) ×9 IMPLANT
SUT PROLENE 8 0 BV175 6 (SUTURE) ×1 IMPLANT
SUT PROLENE BLUE 7 0 (SUTURE) ×3 IMPLANT
SUT PROLENE POLY MONO (SUTURE) ×2 IMPLANT
SUT SILK  1 MH (SUTURE)
SUT SILK 1 MH (SUTURE) IMPLANT
SUT SILK 2 0 SH CR/8 (SUTURE) ×1 IMPLANT
SUT SILK 3 0 SH CR/8 (SUTURE) IMPLANT
SUT STEEL 6MS V (SUTURE) ×5 IMPLANT
SUT STEEL SZ 6 DBL 3X14 BALL (SUTURE) ×3 IMPLANT
SUT VIC AB 1 CTX 36 (SUTURE) ×9
SUT VIC AB 1 CTX36XBRD ANBCTR (SUTURE) ×4 IMPLANT
SUT VIC AB 2-0 CT1 27 (SUTURE) ×3
SUT VIC AB 2-0 CT1 TAPERPNT 27 (SUTURE) IMPLANT
SUT VIC AB 2-0 CTX 27 (SUTURE) IMPLANT
SUT VIC AB 3-0 X1 27 (SUTURE) ×2 IMPLANT
SYR 20ML ECCENTRIC (SYRINGE) ×1 IMPLANT
SYSTEM SAHARA CHEST DRAIN ATS (WOUND CARE) ×3 IMPLANT
TAPE CLOTH SURG 4X10 WHT LF (GAUZE/BANDAGES/DRESSINGS) ×1 IMPLANT
TAPE PAPER 2X10 WHT MICROPORE (GAUZE/BANDAGES/DRESSINGS) ×1 IMPLANT
TOWEL GREEN STERILE (TOWEL DISPOSABLE) ×3 IMPLANT
TOWEL GREEN STERILE FF (TOWEL DISPOSABLE) ×3 IMPLANT
TRAY FOLEY SLVR 16FR TEMP STAT (SET/KITS/TRAYS/PACK) ×3 IMPLANT
TUBING LAP HI FLOW INSUFFLATIO (TUBING) ×3 IMPLANT
TUBING PVC 1/4X1/16 WALL 8 (MISCELLANEOUS) ×1 IMPLANT
UNDERPAD 30X36 HEAVY ABSORB (UNDERPADS AND DIAPERS) ×3 IMPLANT
VEIN 'CRYO SAPHENOUS (Tissue) ×2 IMPLANT
WATER STERILE IRR 1000ML POUR (IV SOLUTION) ×6 IMPLANT
YANKAUER SUCT BULB TIP NO VENT (SUCTIONS) ×1 IMPLANT

## 2021-11-14 NOTE — Anesthesia Postprocedure Evaluation (Signed)
Anesthesia Post Note  Patient: [name]  Procedure(s) Performed: CORONARY ARTERY BYPASS GRAFTING (CABG) TIMES THREE USING LIMA AND CRYO VEIN. (Chest) TRANSESOPHAGEAL ECHOCARDIOGRAM (TEE)     Patient location during evaluation: SICU Anesthesia Type: General Level of consciousness: sedated Pain management: pain level controlled Vital Signs Assessment: post-procedure vital signs reviewed and stable Respiratory status: patient remains intubated per anesthesia plan Cardiovascular status: stable Postop Assessment: no apparent nausea or vomiting Anesthetic complications: no   No notable events documented.  Last Vitals:  Vitals:   11/14/21 1730 11/14/21 1745  BP:    Pulse: 87 66  Resp: 12 12  Temp: (!) 36.2 C (!) 36.2 C  SpO2: 99% 98%    Last Pain:  Vitals:   11/14/21 0626  TempSrc:   PainSc: 0-No pain                 Tiajuana Amass

## 2021-11-14 NOTE — Progress Notes (Signed)
Pre Procedure note for inpatients:   Lacy Sofia Hoard has been scheduled for Procedure(s): CORONARY ARTERY BYPASS GRAFTING (CABG) (N/A) TRANSESOPHAGEAL ECHOCARDIOGRAM (TEE) (N/A) today. The various methods of treatment have been discussed with the patient. After consideration of the risks, benefits and treatment options the patient has consented to the planned procedure.   The patient has been seen and labs reviewed. There are no changes in the patient's condition to prevent proceeding with the planned procedure today.  Recent labs:  Lab Results  Component Value Date   WBC 8.6 11/12/2021   HGB 13.5 11/12/2021   HCT 38.5 11/12/2021   PLT 232 11/12/2021   GLUCOSE 87 11/12/2021   CHOL 187 08/24/2021   TRIG 223 (H) 08/24/2021   HDL 27 (L) 08/24/2021   LDLDIRECT 140.0 01/28/2021   LDLCALC 115 (H) 08/24/2021   ALT 14 11/12/2021   AST 17 11/12/2021   NA 140 11/12/2021   K 3.8 11/12/2021   CL 107 11/12/2021   CREATININE 0.94 11/12/2021   BUN 21 11/12/2021   CO2 23 11/12/2021   TSH 0.481 11/01/2021   INR 1.0 11/12/2021   HGBA1C 6.3 (H) 08/22/2021   MICROALBUR 0.8 12/27/2019    Dahlia Byes, MD 11/14/2021 7:44 AM

## 2021-11-14 NOTE — Op Note (Signed)
NAMEDELANNA, [name] MEDICAL RECORD NO: [id number] ACCOUNT NO: [id number] DATE OF BIRTH: [birth year] FACILITY: [facility] LOCATION: [facility] PHYSICIAN: Ivin Poot III, MD  Operative Report   DATE OF PROCEDURE: 11/14/2021  OPERATION:   1.  Coronary artery bypass grafting x3 (left internal mammary artery to LAD, cryopreserved saphenous vein to OM1 and OM2). 2.  Unsuccessful attempt for harvest of left leg greater saphenous vein due to absence of adequate vein conduit.  PREOPERATIVE DIAGNOSES: Two-vessel coronary artery disease with exertional angina and moderate LV dysfunction.  POSTOPERATIVE DIAGNOSES: Two-vessel coronary artery disease with exertional angina and moderate LV dysfunction.  SURGEON:  Len Childs, MD  ASSISTANT:  Jadene Pierini, PA-C.  A surgical first assistant was needed for this operation due to the complexity of the procedure and the standard of care for surgical cardiac surgery at this institution.  The first assistant was needed to expose and harvest greater saphenous vein  conduit.  Also, he was needed to assist with the distal anastomoses with suture management, suctioning and exposure and general assistance.  ANESTHESIA:  General.  DESCRIPTION OF PROCEDURE:  The patient was seen in preoperative holding where informed consent was documented and final issues were addressed with the patient concerning the procedure.  The patient had been seen in consultation on numerous occasions to  assess and discuss surgical revascularization of her coronary artery disease.  Only until recently did she accept the recommendation to proceed with surgery and the details of the operation including the risks and benefits were carefully outlined and  reviewed again with the patient last week.  She demonstrated her understanding of the risks and benefits and gave consent to proceed with surgery.  The patient was brought directly from the preoperative holding area to the OR where she was  placed supine on the operating table and general anesthesia was induced under invasive hemodynamic monitoring.  The patient remained stable.  A transesophageal  echo probe was placed by the anesthesia team.  The patient was prepped and draped as a sterile field.  A proper timeout was performed.  A sternal incision was made as attempts were made to expose and harvest the left leg greater saphenous vein.  The  patient had previously had a right leg vein stripping and had no vein present in the right leg on preoperative vein mapping.  Preoperative vein mapping did indicate the presence of probable adequate conduit in the left upper leg; however, the vein that  was exposed and present was too small and scarred to use as a conduit.  After the sternotomy, the left internal mammary artery was harvested as a pedicle graft.  It was a 1.4 mm vessel with adequate flow.  The sternal retractor was placed and the pericardium was opened and suspended.  Pursestrings were placed in the  ascending aorta and right atrium and heparin was administered.  When the ACT was documented as being therapeutic, the patient was cannulated and placed on bypass.  Coronary artery targets were identified in the LAD, which was very small, approximately  1.2 mm, but graftable. The OM2, which was a 1.5 mm vessel and graftable and the OM1, which was a 1.4 mm vessel and small, but graftable.  Since the patient had no saphenous vein conduit, cryopreserved allograft vein was prepared according to protocol and presented to the field.  It was cut to the appropriate length and the patient was cooled to 32 degrees.  Cardioplegia cannulas had been  placed for antegrade aortic  and retrograde coronary sinus cardioplegia.  The patient was cooled and then the aortic crossclamp was applied.  One liter of cold blood cardioplegia was delivered in split doses between the antegrade aortic and retrograde  coronary sinus catheters.  There was good cardioplegic  arrest and septal temperature dropped less than 14 degrees, especially with the infusion through the retrograde catheter.  The first distal coronary anastomosis was the OM2 branch of the circumflex.  This had a proximal 80% to 90% stenosis.  A reverse saphenous vein was sewn end-to-side with running 7-0 Prolene with good flow through the graft.  Cardioplegia was redosed.  The second distal anastomosis was to the OM1.  This was a smaller 1.3 mm vessel with proximal 80% stenosis.  A reverse saphenous vein was sewn end-to-side with running 7-0 Prolene with good flow through the graft.  Cardioplegia was redosed.  The third distal anastomosis was the distal third of the LAD.  There was a proximal 95% to 99% stenosis.  The LAD was 1.2 mm.  The left IMA pedicle was brought through an opening in the left lateral pericardium.  It was brought on onto the LAD and sewn  end-to-side with running 8-0 Prolene.  There was good flow through the anastomosis after briefly releasing the pedicle bulldog and the mammary artery.  The bulldog was reapplied and the pedicle secured to epicardium.  Cardioplegia was redosed.  While the crossclamp was still in place, 2 proximal vein anastomoses were performed on the ascending aorta using a 4.5 mm punch and running 6-0 Prolene.  Prior to tying down the final proximal anastomosis, air was vented from the coronaries with a dose  of retrograde warm blood cardioplegia.  The crossclamp was removed.  The heart resumed a spontaneous rhythm.  The vein grafts were de-aired and opened.  Each had good flow and hemostasis was documented at the proximal and distal sites.  The patient was rewarmed and reperfused.  Temporary pacing wires were applied.  The  lungs were expanded and the ventilator was resumed.  The patient was started on low-dose milrinone.  The patient was then transitioned off cardiopulmonary bypass without difficulty with stable hemodynamics and then cardiac index of 2.0.  Echo  showed  preserved LV systolic function about 13% ejection fraction.  Protamine was administered without adverse reaction.  There was still diffuse coagulopathy and the patient received FFP, which improved coagulation function.  The superior pericardial fat was closed over the aorta and vein grafts.  Anterior mediastinal  and left pleural tubes were placed and brought out through separate incisions.  The sternum was closed with interrupted steel wire.  The patient remained stable.  The pectoralis fascia was closed with a running #1 Vicryl.  The subcutaneous and skin  layers were closed with running Vicryl and sterile dressings were applied.  Total cardiopulmonary bypass time was 129 minutes.       PAA D: 11/14/2021 5:36:41 pm T: 11/14/2021 10:29:00 pm  JOB: [id number]/ [id number]

## 2021-11-14 NOTE — H&P (Signed)
PCP is Martinique, Betty G, MD Referring Provider is No ref. provider found  No chief complaint on file.   HPI: Patient returns for appointment to discuss surgical revascularization for her known coronary disease.  After cardiac catheterization approximately 6 months ago she has been seen several times regarding her two-vessel coronary disease.  She has been recommended coronary bypass surgery but her anxiety and doubts that she needs surgery have prevented going forward.  She has been having some exertional as well as at rest jaw pain which are new symptoms.  Her echo has shown her ejection fraction now at approximately 40%, slightly down from 50 to 55% at the time of cath.  Echocardiogram currently also shows evidence of aortic sclerosis, but no significant gradient.  The patient was seen in the cardiology heart failure clinic last month.  She was complaining of PVCs and a Zio patch was prescribed.  She also had her beta-blocker dose increased and was started on losartan.  She took off the Zio patch because she was contacted by the company and charged several $100 as co-pay which she cannot afford.  We had a long talk about coronary bypass surgery, the location of the incisions and the expected hospital stay as well as the longer term postoperative recovery.  She is planned ahead and reserved a rehab bed at [facility] center after discharge from the hospital.  She does not need to be read cath.  She has had a recent echocardiogram showing EF of 40% with mild mitral regurgitation.  Patient is a non-smoker. Patient did have vein stripping of the right leg by a surgeon several years ago and will need vein mapping prior to the procedure.  Her last hospitalization was in May for probable colitis associated with some pneumonia and she has resolved all of those symptoms.  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[place])    Anxiety    Arthritis    Diabetes mellitus without  complication (HCC)    GERD (gastroesophageal reflux disease)    Heart murmur    Hyperlipidemia    Hypertension    Neuromuscular disorder (HCC)    Palpitations    Pneumonia    PONV (postoperative nausea and vomiting)    PTSD (post-traumatic stress disorder)    Ringing of ears, left     Past Surgical History:  Procedure Laterality Date   ABDOMINAL HYSTERECTOMY     partial   ACHILLES TENDON SURGERY Left    APPENDECTOMY     BACK SURGERY     BLADDER SUSPENSION     BREAST SURGERY Right 2003   milk duct removed   CARDIAC CATHETERIZATION     CHOLECYSTECTOMY     FRACTURE SURGERY     collar bone   LEFT HEART CATH AND CORONARY ANGIOGRAPHY N/A 03/08/2021   Procedure: LEFT HEART CATH AND CORONARY ANGIOGRAPHY;  Surgeon: Martinique, Alexandria Shiflett M, MD;  Location: [facility];  Service: Cardiovascular;  Laterality: N/A;   NOSE SURGERY     x 2    OTHER SURGICAL HISTORY     Breast Duct removed   PARTIAL HYSTERECTOMY     RECTOCELE REPAIR     TONSILLECTOMY     VASCULAR SURGERY Right    right leg   VEIN SURGERY Right    leg    Family History  Problem Relation Age of Onset   Diabetes Mother    Alzheimer's disease Mother    Lung cancer Father  Esophageal cancer Brother    Colon cancer Neg Hx     Social History Social History   Tobacco Use   Smoking status: Never   Smokeless tobacco: Never   Tobacco comments:    Never smoke 11/01/21  Vaping Use   Vaping Use: Never used  Substance Use Topics   Alcohol use: Never   Drug use: No    Current Facility-Administered Medications  Medication Dose Route Frequency Provider Last Rate Last Admin   0.9 % irrigation (POUR BTL)    PRN Dahlia Byes, MD   5,000 mL at 11/14/21 0728   ceFAZolin (ANCEF) IVPB 2g/100 mL premix  2 g Intravenous To OR Skeet Simmer, RPH       ceFAZolin (ANCEF) IVPB 2g/100 mL premix  2 g Intravenous To OR Skeet Simmer, RPH       chlorhexidine (HIBICLENS) 4 % liquid 2 Application  30 mL Topical UD Dahlia Byes, MD       [facility]   Does not apply Once Dahlia Byes, MD       dexmedetomidine (PRECEDEX) 400 MCG/100ML (4 mcg/mL) infusion  0.1-0.7 mcg/kg/hr Intravenous To OR Skeet Simmer, RPH       EPINEPHrine (ADRENALIN) 5 mg in NS 250 mL (0.02 mg/mL) premix infusion  0-10 mcg/min Intravenous To OR Skeet Simmer, RPH       heparin 30,000 units/NS 1000 mL solution for CELLSAVER   Other To OR Skeet Simmer, RPH       heparin sodium (porcine) 2,500 Units, papaverine 30 mg in electrolyte-A (PLASMALYTE-A PH 7.4) 500 mL irrigation   Irrigation To OR Skeet Simmer, RPH       heparin sodium (porcine) 2,500 Units, papaverine 30 mg in electrolyte-A (PLASMALYTE-A PH 7.4) 500 mL irrigation    PRN Dahlia Byes, MD   500 mL at 11/14/21 0732   insulin regular, human (MYXREDLIN) 100 units/ 100 mL infusion   Intravenous To OR Skeet Simmer, RPH       magnesium sulfate (IV Push/IM) injection 40 mEq  40 mEq Other To OR Skeet Simmer, RPH       metoprolol tartrate (LOPRESSOR) tablet 12.5 mg  12.5 mg Oral Once Dahlia Byes, MD       milrinone (PRIMACOR) 20 MG/100 ML (0.2 mg/mL) infusion  0.3 mcg/kg/min Intravenous To OR Skeet Simmer, [facility]       nitroGLYCERIN 50 mg in dextrose 5 % 250 mL (0.2 mg/mL) infusion  2-200 mcg/min Intravenous To OR Skeet Simmer, RPH       norepinephrine (LEVOPHED) 58m in 2520m(0.016 mg/mL) premix infusion  0-40 mcg/min Intravenous To OR EgSkeet SimmerRPH       phenylephrine (NEO-SYNEPHRINE) 2047mS 250m20memix infusion  30-200 mcg/min Intravenous To OR EganSkeet SimmerH       potassium chloride injection 80 mEq  80 mEq Other To OR EganSkeet SimmerH [facility]   Surgiflo with Thrombin (Hemostatic Matrix Kit) Optime    PRN VantDahlia Byes   1 Application at 08/165/46/501   Surgifoam 1 Gm with 0.9% sodium chloride (4 ml) topical solution    PRN VantDahlia Byes   4 mL at 11/14/21 0740   tranexamic acid (CYKLOKAPRON)  2,500 mg in sodium chloride 0.9 % 250 mL (10 mg/mL) infusion  1.5 mg/kg/hr Intravenous To OR EganSkeet SimmerH       tranexamic  acid (CYKLOKAPRON) bolus via infusion - over 30 minutes 1,192.5 mg  15 mg/kg Intravenous To OR Skeet Simmer, RPH       tranexamic acid (CYKLOKAPRON) pump prime solution 159 mg  2 mg/kg Intracatheter To OR Skeet Simmer, RPH       vancomycin (VANCOREADY) IVPB 1250 mg/250 mL  1,250 mg Intravenous To OR Skeet Simmer, [facility]        Allergies  Allergen Reactions   Morphine And Related Itching    With high doses   Other Itching   Penicillins Rash    Has patient had a PCN reaction causing immediate rash, facial/tongue/throat swelling, SOB or lightheadedness with hypotension: Yes Has patient had a PCN reaction causing severe rash involving mucus membranes or skin necrosis: No Has patient had a PCN reaction that required hospitalization: No Has patient had a PCN reaction occurring within the last 10 years: No If all of the above answers are "NO", then may proceed with Cephalosporin use.     Review of Systems: Weight stable no fevers Poor mobility due to previous back surgery with spinal rod Intermittent jaw pain and reflux symptoms could be anginal equivalents No peripheral edema No abdominal pain History of diabetic neuropathy of her feet for which she takes Neurontin.   BP 128/74   Pulse 70   Temp (!) 97.4 F (36.3 C) (Oral)   Resp 18   Ht _0  (1.727 m)   Wt 79.8 kg   SpO2 95%   BMI 26.76 kg/m  Physical Exam:      Exam    General- alert and comfortable    Neck- no JVD, no cervical adenopathy palpable, no carotid bruit   Lungs- clear without rales, wheezes   Cor- regular rate and rhythm, no murmur , gallop   Abdomen- soft, non-tender   Extremities - warm, non-tender, minimal edema   Neuro- oriented, appropriate, no focal weakness   Diagnostic Tests: Coronary angiogram shows high-grade proximal LAD stenosis with an atretic distal vessel  and a larger diagonal vessel with proximal stenosis.  Right coronary is no disease.  The circumflex has a smaller first marginal with proximal stenosis as well as a larger OM 2/OM 3 bifurcation with a proximal stenosis. Echocardiogram shows EF 40-45% with calcified aortic valve without stenosis and mild MR.  Impression:  Persistent class III/class IV angina on optimal medical therapy for CAD.  Her LV ectopy and LVEF are also showing signs of worsening and are probably related to her coronary disease.  All of these findings strongly indicate the patient would benefit from multivessel coronary bypass surgery.  We have discussed these expected benefits from the surgery as well as the potential risks and her surgery has been scheduled at [facility] on August 10.  Plan:  Continue current medications Office will call regarding preoperative evaluation 48 hours before surgery scheduled for August 10.   Dahlia Byes, MD [facility] [phone]  11-14-21 NO CHANGE IN SYMPTOMS OR EXAM VEIN MAPPING>>> WILL USE left LEG VEIN Conduit.

## 2021-11-14 NOTE — Anesthesia Procedure Notes (Addendum)
Arterial Line Insertion Start/End8/01/2022 7:15 AM, 11/14/2021 7:30 AM Performed by: Suzette Battiest, MD, anesthesiologist  Patient location: Pre-op. Preanesthetic checklist: patient identified, IV checked, site marked, risks and benefits discussed, surgical consent, monitors and equipment checked, pre-op evaluation, timeout performed and anesthesia consent Lidocaine 1% used for infiltration and patient sedated Right, radial was placed Catheter size: 20 G Hand hygiene performed  Allen's test indicative of satisfactory collateral circulation Attempts: 1 (2 attempts to L radial by CRNA) Procedure performed using ultrasound guided technique. Ultrasound Notes:anatomy identified, needle tip was noted to be adjacent to the nerve/plexus identified and image(s) printed for medical record Following insertion, dressing applied and Biopatch. Post procedure complications: second provider assisted. Patient tolerated the procedure well with no immediate complications.

## 2021-11-14 NOTE — Brief Op Note (Signed)
11/14/2021  7:36 AM  PATIENT:  [name]  71 y.o. female  PRE-OPERATIVE DIAGNOSIS:  CAD  POST-OPERATIVE DIAGNOSIS:  * No post-op diagnosis entered *  PROCEDURE:  Procedure(s): CORONARY ARTERY BYPASS GRAFTING (CABG) TIMES THREE USING LIMA AND CRYO VEIN. (N/A) TRANSESOPHAGEAL ECHOCARDIOGRAM (TEE) (N/A)    * Dahlia Byes, MD - Primary  CRYOVEIN-OM1 CRYOVEIN-OM2 LIMA-LAD  ATTEMPTED EVH AND OPEN VEIN HARVEST - VEIN TO SMALL FOR BYPASS  PHYSICIAN ASSISTANT: Areliz Rothman PA-C  ASSISTANTS: STAFF RNFA   ANESTHESIA:   general  EBL:  685 mL   BLOOD ADMINISTERED:none  DRAINS:  LEFT PLEURAL AND MEDIASTINAL CHEST TUBES    LOCAL MEDICATIONS USED:  NONE  SPECIMEN:  No Specimen  DISPOSITION OF SPECIMEN:  N/A  COUNTS:  YES  TOURNIQUET:  * No tourniquets in log *  DICTATION: .Other Dictation: Dictation Number PENDING  PLAN OF CARE: Admit to inpatient   PATIENT DISPOSITION:  ICU - intubated and hemodynamically stable.   Delay start of Pharmacological VTE agent (>24hrs) due to surgical blood loss or risk of bleeding: yes  COMPLICATIONS: NO KNOWN

## 2021-11-14 NOTE — Anesthesia Procedure Notes (Signed)
Central Venous Catheter Insertion Performed by: Albertha Ghee, MD, anesthesiologist Start/End8/01/2022 7:02 AM, 11/14/2021 7:04 AM Patient location: Pre-op. Preanesthetic checklist: patient identified, IV checked, site marked, risks and benefits discussed, surgical consent, monitors and equipment checked, pre-op evaluation, timeout performed and anesthesia consent Hand hygiene performed  and maximum sterile barriers used  Catheter size: 8.5 Fr PA cath was placed.Swan type:thermodilution Procedure performed without using ultrasound guided technique. Attempts: 1 Patient tolerated the procedure well with no immediate complications.

## 2021-11-14 NOTE — Anesthesia Procedure Notes (Signed)
Central Venous Catheter Insertion Performed by: Albertha Ghee, MD, anesthesiologist Start/End8/01/2022 6:48 AM, 11/14/2021 7:02 AM Patient location: Pre-op. Preanesthetic checklist: patient identified, IV checked, site marked, risks and benefits discussed, surgical consent, monitors and equipment checked, pre-op evaluation, timeout performed and anesthesia consent Lidocaine 1% used for infiltration and patient sedated Hand hygiene performed  and maximum sterile barriers used  Catheter size: 8.5 Fr Sheath introducer Procedure performed using ultrasound guided technique. Ultrasound Notes:anatomy identified, needle tip was noted to be adjacent to the nerve/plexus identified, no ultrasound evidence of intravascular and/or intraneural injection and image(s) printed for medical record Attempts: 1 Following insertion, line sutured and dressing applied. Post procedure assessment: blood return through all ports, free fluid flow and no air  Patient tolerated the procedure well with no immediate complications.

## 2021-11-14 NOTE — Transfer of Care (Signed)
Immediate Anesthesia Transfer of Care Note  Patient: [name]  Procedure(s) Performed: CORONARY ARTERY BYPASS GRAFTING (CABG) TIMES THREE USING LIMA AND CRYO VEIN. (Chest) TRANSESOPHAGEAL ECHOCARDIOGRAM (TEE)  Patient Location: SICU  Anesthesia Type:General  Level of Consciousness: sedated and Patient remains intubated per anesthesia plan  Airway & Oxygen Therapy: Patient remains intubated per anesthesia plan and Patient placed on Ventilator (see vital sign flow sheet for setting)  Post-op Assessment: Report given to RN and Post -op Vital signs reviewed and stable  Post vital signs: Reviewed and stable  Last Vitals:  Vitals Value Taken Time  BP 109/59 11/14/21 1434  Temp    Pulse 85 11/14/21 1442  Resp 12 11/14/21 1442  SpO2 97 % 11/14/21 1442  Vitals shown include unvalidated device data.  Last Pain:  Vitals:   11/14/21 0626  TempSrc:   PainSc: 0-No pain      Patients Stated Pain Goal: 0 (23/53/61 4431)  Complications: No notable events documented.

## 2021-11-14 NOTE — Progress Notes (Signed)
      [address]       [place] [address]             [phone]      S/p CABG x 3  Intubated, sedated  BP 99/64   Pulse 82   Temp (!) 97.5 F (36.4 C)   Resp (!) 21   Ht '5\' 8"'$  (1.727 m)   Wt 79.8 kg   SpO2 98%   BMI 26.76 kg/m  19/11 CI = 2.0 Milrinone 0.25 mcg/kg/min  Minimal CT output  Intake/Output Summary (Last 24 hours) at 11/14/2021 1725 Last data filed at 11/14/2021 1700 Gross per 24 hour  Intake 3050.73 ml  Output 3310 ml  Net -259.27 ml   Hct 27 K= 4.0  Doing well early postop  Remo Lipps C. Roxan Hockey, MD [facility] [phone]

## 2021-11-14 NOTE — Progress Notes (Signed)
  Echocardiogram Echocardiogram Transesophageal has been performed.  [name] 11/14/2021, 8:51 AM

## 2021-11-14 NOTE — Anesthesia Procedure Notes (Signed)
Procedure Name: Intubation Date/Time: 11/14/2021 8:08 AM  Performed by: Leonor Liv, CRNAPre-anesthesia Checklist: Patient identified, Emergency Drugs available, Suction available and Patient being monitored Patient Re-evaluated:Patient Re-evaluated prior to induction Oxygen Delivery Method: Circle System Utilized Preoxygenation: Pre-oxygenation with 100% oxygen Induction Type: IV induction Ventilation: Mask ventilation without difficulty and Oral airway inserted - appropriate to patient size Laryngoscope Size: Glidescope and 3 Grade View: Grade I Tube type: Oral Tube size: 8.0 mm Number of attempts: 2 Airway Equipment and Method: Oral airway, Rigid stylet and Video-laryngoscopy Placement Confirmation: ETT inserted through vocal cords under direct vision, positive ETCO2 and breath sounds checked- equal and bilateral Secured at: 21 cm Tube secured with: Tape Dental Injury: Teeth and Oropharynx as per pre-operative assessment  Difficulty Due To: Difficult Airway- due to anterior larynx Comments: Recommend Glidescope for intubation. Anterior airway, small TMD, overbite

## 2021-11-14 NOTE — Procedures (Signed)
Extubation Procedure Note  Patient Details:   Name: [name] DOB: 16-Nov-1950 MRN: [id number]   Airway Documentation:    Vent end date: 11/14/21 Vent end time: 2220   Evaluation  O2 sats: stable throughout Complications: No apparent complications Patient did tolerate procedure well. Bilateral Breath Sounds: Clear   Yes Patient extubated per rapid wean protocol. Positive cuff leak. NIF -25, VC .65L. Vitals are stable on 4L [place]. RN at bedside.   Quentin Ore 11/14/2021, 10:27 PM

## 2021-11-15 ENCOUNTER — Inpatient Hospital Stay (HOSPITAL_COMMUNITY): Payer: Medicare Other

## 2021-11-15 LAB — PREPARE FRESH FROZEN PLASMA: Unit division: 0

## 2021-11-15 LAB — BASIC METABOLIC PANEL
Anion gap: 5 (ref 5–15)
Anion gap: 5 (ref 5–15)
BUN: 18 mg/dL (ref 8–23)
BUN: 19 mg/dL (ref 8–23)
CO2: 23 mmol/L (ref 22–32)
CO2: 26 mmol/L (ref 22–32)
Calcium: 7.6 mg/dL — ABNORMAL LOW (ref 8.9–10.3)
Calcium: 8 mg/dL — ABNORMAL LOW (ref 8.9–10.3)
Chloride: 109 mmol/L (ref 98–111)
Chloride: 106 mmol/L (ref 98–111)
Creatinine, Ser: 0.8 mg/dL (ref 0.44–1.00)
Creatinine, Ser: 1.05 mg/dL — ABNORMAL HIGH (ref 0.44–1.00)
GFR, Estimated: >60 mL/min (ref >60)
GFR, Estimated: 58 mL/min — ABNORMAL LOW (ref >60)
Glucose, Bld: 124 mg/dL — ABNORMAL HIGH (ref 70–99)
Glucose, Bld: 152 mg/dL — ABNORMAL HIGH (ref 70–99)
Potassium: 4.2 mmol/L (ref 3.5–5.1)
Potassium: 3.7 mmol/L (ref 3.5–5.1)
Sodium: 137 mmol/L (ref 135–145)
Sodium: 137 mmol/L (ref 135–145)

## 2021-11-15 LAB — CBC
HCT: 24.7 % — ABNORMAL LOW (ref 36.0–46.0)
HCT: 25 % — ABNORMAL LOW (ref 36.0–46.0)
Hemoglobin: 8.5 g/dL — ABNORMAL LOW (ref 12.0–15.0)
Hemoglobin: 8.5 g/dL — ABNORMAL LOW (ref 12.0–15.0)
MCH: 32.2 pg (ref 26.0–34.0)
MCH: 32.4 pg (ref 26.0–34.0)
MCHC: 34.4 g/dL (ref 30.0–36.0)
MCHC: 34 g/dL (ref 30.0–36.0)
MCV: 93.6 fL (ref 80.0–100.0)
MCV: 95.4 fL (ref 80.0–100.0)
Platelets: 119 10*3/uL — ABNORMAL LOW (ref 150–400)
Platelets: 119 10*3/uL — ABNORMAL LOW (ref 150–400)
RBC: 2.64 MIL/uL — ABNORMAL LOW (ref 3.87–5.11)
RBC: 2.62 MIL/uL — ABNORMAL LOW (ref 3.87–5.11)
RDW: 12.2 % (ref 11.5–15.5)
RDW: 12.1 % (ref 11.5–15.5)
WBC: 8.1 10*3/uL (ref 4.0–10.5)
WBC: 8.3 10*3/uL (ref 4.0–10.5)
nRBC: 0 % (ref 0.0–0.2)
nRBC: 0 % (ref 0.0–0.2)

## 2021-11-15 LAB — GLUCOSE, CAPILLARY
Glucose-Capillary: 114 mg/dL — ABNORMAL HIGH (ref 70–99)
Glucose-Capillary: 113 mg/dL — ABNORMAL HIGH (ref 70–99)
Glucose-Capillary: 98 mg/dL (ref 70–99)
Glucose-Capillary: 121 mg/dL — ABNORMAL HIGH (ref 70–99)
Glucose-Capillary: 119 mg/dL — ABNORMAL HIGH (ref 70–99)
Glucose-Capillary: 137 mg/dL — ABNORMAL HIGH (ref 70–99)
Glucose-Capillary: 142 mg/dL — ABNORMAL HIGH (ref 70–99)
Glucose-Capillary: 153 mg/dL — ABNORMAL HIGH (ref 70–99)
Glucose-Capillary: 147 mg/dL — ABNORMAL HIGH (ref 70–99)
Glucose-Capillary: 121 mg/dL — ABNORMAL HIGH (ref 70–99)

## 2021-11-15 LAB — BPAM FFP
Blood Product Expiration Date: 202308152359
Blood Product Expiration Date: 202308152359
ISSUE DATE / TIME: 202308101227
ISSUE DATE / TIME: 202308101227
Unit Type and Rh: 6200
Unit Type and Rh: 6200

## 2021-11-15 LAB — MAGNESIUM
Magnesium: 2.4 mg/dL (ref 1.7–2.4)
Magnesium: 2.3 mg/dL (ref 1.7–2.4)

## 2021-11-15 LAB — SURGICAL PATHOLOGY

## 2021-11-15 MED ORDER — MIDAZOLAM HCL 2 MG/2ML IJ SOLN: 1.0000 mg | Freq: Four times a day (QID) | INTRAMUSCULAR | Status: DC | PRN

## 2021-11-15 MED ORDER — AMIODARONE LOAD VIA INFUSION
150.0000 mg | Freq: Once | INTRAVENOUS | Status: AC
  Administered 2021-11-15: 150 mg via INTRAVENOUS
  Filled 2021-11-15: qty 83.34

## 2021-11-15 MED ORDER — FUROSEMIDE 10 MG/ML IJ SOLN
20.0000 mg | Freq: Two times a day (BID) | INTRAMUSCULAR | Status: DC
  Administered 2021-11-15 – 2021-11-17 (×5): 20 mg via INTRAVENOUS
  Filled 2021-11-15 (×5): qty 2

## 2021-11-15 MED ORDER — FENTANYL CITRATE PF 50 MCG/ML IJ SOSY: 25.0000 ug | PREFILLED_SYRINGE | INTRAMUSCULAR | Status: DC | PRN

## 2021-11-15 MED ORDER — KETOROLAC TROMETHAMINE 15 MG/ML IJ SOLN
15.0000 mg | Freq: Four times a day (QID) | INTRAMUSCULAR | Status: DC
  Administered 2021-11-15 – 2021-11-16 (×5): 15 mg via INTRAVENOUS
  Filled 2021-11-15 (×5): qty 1

## 2021-11-15 MED ORDER — GABAPENTIN 600 MG PO TABS
600.0000 mg | ORAL_TABLET | Freq: Three times a day (TID) | ORAL | Status: DC
  Administered 2021-11-15 – 2021-11-22 (×22): 600 mg via ORAL
  Filled 2021-11-15 (×23): qty 1

## 2021-11-15 MED ORDER — ENOXAPARIN SODIUM 40 MG/0.4ML IJ SOSY
40.0000 mg | PREFILLED_SYRINGE | Freq: Every day | INTRAMUSCULAR | Status: DC
  Administered 2021-11-15 – 2021-11-21 (×7): 40 mg via SUBCUTANEOUS
  Filled 2021-11-15 (×9): qty 0.4

## 2021-11-15 MED ORDER — AMIODARONE HCL IN DEXTROSE 360-4.14 MG/200ML-% IV SOLN
30.0000 mg/h | INTRAVENOUS | Status: DC
  Administered 2021-11-15 – 2021-11-16 (×2): 30 mg/h via INTRAVENOUS
  Filled 2021-11-15: qty 200

## 2021-11-15 MED ORDER — AMIODARONE HCL IN DEXTROSE 360-4.14 MG/200ML-% IV SOLN
60.0000 mg/h | INTRAVENOUS | Status: AC
  Administered 2021-11-15 (×2): 60 mg/h via INTRAVENOUS
  Filled 2021-11-15 (×2): qty 200

## 2021-11-15 MED ORDER — POTASSIUM CHLORIDE 10 MEQ/50ML IV SOLN
10.0000 meq | INTRAVENOUS | Status: AC
  Administered 2021-11-15 (×2): 10 meq via INTRAVENOUS
  Filled 2021-11-15 (×2): qty 50

## 2021-11-15 MED ORDER — OXYCODONE HCL 5 MG PO TABS
5.0000 mg | ORAL_TABLET | ORAL | Status: DC | PRN
  Administered 2021-11-15 – 2021-11-18 (×12): 5 mg via ORAL
  Filled 2021-11-15 (×12): qty 1

## 2021-11-15 MED ORDER — INSULIN ASPART 100 UNIT/ML IJ SOLN
0.0000 [IU] | INTRAMUSCULAR | Status: DC
  Administered 2021-11-15 – 2021-11-16 (×5): 2 [IU] via SUBCUTANEOUS

## 2021-11-15 NOTE — Progress Notes (Signed)
1 Day Post-Op Procedure(s) (LRB): CORONARY ARTERY BYPASS GRAFTING (CABG) TIMES THREE USING LIMA AND CRYO VEIN. (N/A) TRANSESOPHAGEAL ECHOCARDIOGRAM (TEE) (N/A) Subjective:  Appears over medicated Stable hemodynamics Objective: Vital signs in last 24 hours: Temp:  [94.6 F (34.8 C)-99 F (37.2 C)] 98.6 F (37 C) (08/11 0630) Pulse Rate:  [66-101] 80 (08/11 0630) Cardiac Rhythm: Atrial paced (08/10 2000) Resp:  [10-21] 17 (08/11 0630) BP: (92-119)/(51-77) 116/58 (08/11 0600) SpO2:  [92 %-100 %] 99 % (08/11 0630) Arterial Line BP: (97-170)/(43-94) 170/56 (08/11 0630) FiO2 (%):  [40 %-50 %] 40 % (08/10 2147)  Hemodynamic parameters for last 24 hours: PAP: (16-43)/(8-20) 27/14 CO:  [3.4 L/min-5.8 L/min] 5.8 L/min CI:  [1.7 L/min/m2-3 L/min/m2] 3 L/min/m2  Intake/Output from previous day: 08/10 0701 - 08/11 0700 In: 4192.6 [I.V.:2294.4; Blood:552; IV Piggyback:1346.2] Out: 4100 [Urine:3155; Blood:685; Chest Tube:260] Intake/Output this shift: No intake/output data recorded.       Exam    General- sleepy and comfortable    Neck- no JVD, no cervical adenopathy palpable, no carotid bruit   Lungs- clear without rales, wheezes   Cor- regular rate and rhythm, no murmur , gallop   Abdomen- soft, non-tender   Extremities - warm, non-tender, minimal edema   Neuro- oriented, sedated no focal weakness   Lab Results: Recent Labs    11/14/21 2037 11/14/21 2038 11/14/21 2329 11/15/21 0442  WBC 7.2  --   --  8.1  HGB 8.8*   < > 7.5* 8.5*  HCT 26.0*   < > 22.0* 24.7*  PLT 104*  --   --  119*   < > = values in this interval not displayed.   BMET:  Recent Labs    11/14/21 2037 11/14/21 2038 11/14/21 2329 11/15/21 0442  NA 140   < > 141 137  K 4.5   < > 4.5 4.2  CL 111  --   --  109  CO2 22  --   --  23  GLUCOSE 147*  --   --  124*  BUN 18  --   --  18  CREATININE 0.83  --   --  0.80  CALCIUM 7.8*  --   --  7.6*   < > = values in this interval not displayed.    PT/INR:   Recent Labs    11/14/21 1449  LABPROT 16.9*  INR 1.4*   ABG    Component Value Date/Time   PHART 7.353 11/14/2021 2329   HCO3 24.4 11/14/2021 2329   TCO2 26 11/14/2021 2329   ACIDBASEDEF 1.0 11/14/2021 2329   O2SAT 99 11/14/2021 2329   CBG (last 3)  Recent Labs    11/15/21 0249 11/15/21 0442 11/15/21 0551  GLUCAP 113* 98 121*    Assessment/Plan: S/P Procedure(s) (LRB): CORONARY ARTERY BYPASS GRAFTING (CABG) TIMES THREE USING LIMA AND CRYO VEIN. (N/A) TRANSESOPHAGEAL ECHOCARDIOGRAM (TEE) (N/A) Mobilize Diuresis d/c tubes/lines See progression orders Reduce milrinone  .125   LOS: 1 day    Dahlia Byes 11/15/2021

## 2021-11-15 NOTE — Discharge Summary (Addendum)
[address]       [place] [address]             [phone]    Physician Discharge Summary  Patient ID: [name] MRN: [id number] DOB/AGE: [birth year] 71 y.o.  Admit date: 11/14/2021 Discharge date: 11/22/2021  Admission Diagnoses: Severe coronary artery disease  Patient Active Problem List   Diagnosis Date Noted   Atrial fibrillation ([place]) 11/21/2021   S/P CABG x 3 11/14/2021   Chest pain at rest 11/06/2021   Statin myopathy 10/18/2021   T[place] Must Quervain's disease (radial styloid tenosynovitis) 09/26/2021   NSTEMI (non-ST elevated myocardial infarction) ([place]) 08/22/2021   Nausea vomiting and diarrhea 08/22/2021   AKI (acute kidney injury) ([place]) 08/22/2021   CAP (community acquired pneumonia) 08/22/2021   Pneumonia 62/26/3335   Chronic systolic CHF (congestive heart failure) ([place])    Abdominal cramping    History of atherosclerotic heart disease 07/15/2021   Coronary artery disease involving native coronary artery of native heart without angina pectoris 04/19/2021   Abnormal nuclear cardiac imaging test 03/08/2021   Diabetic peripheral neuropathy ([place]) 12/06/2020   Anxiety 01/27/2020   HNP (herniated nucleus pulposus), lumbar 01/13/2020   PTSD (post-traumatic stress disorder)    Polyneuropathy associated with underlying disease ([place]) 07/26/2019   Allergic rhinitis 07/26/2019   Atherosclerosis of aorta ([place]) 04/04/2019   Myalgia due to statin 11/18/2017   Depression, major, recurrent, in partial remission ([place]) 10/16/2017   Insomnia 10/16/2017   Chronic pain syndrome 09/07/2017   Spinal stenosis of lumbar region 07/08/2017   DOE (dyspnea on exertion) 12/16/2016   Family history of early CAD 12/16/2016   Other chest pain 12/16/2016   Arthritis of left acromioclavicular joint 08/13/2016   Left shoulder pain 05/12/2016   Cervical spondylosis without myelopathy 05/12/2016   Rotator cuff syndrome, left 05/12/2016   Restless leg syndrome  05/12/2016   Primary osteoarthritis, left hand 05/12/2016   MELENA 10/18/2008   GASTROPARESIS 05/30/2008   Type 2 diabetes mellitus with neurological complications ([place]) 45/62/5638   HYPERCHOLESTEROLEMIA 05/04/2008   HYPOKALEMIA 05/04/2008   GERD 05/04/2008   Constipation 05/04/2008   HEMATEMESIS 05/04/2008   Essential hypertension 05/04/2008     Discharge Diagnoses:  Patient Active Problem List   Diagnosis Date Noted   Atrial fibrillation ([place]) 11/21/2021   S/P CABG x 3 11/14/2021   Chest pain at rest 11/06/2021   Statin myopathy 10/18/2021   [place] Quervain's disease (radial styloid tenosynovitis) 09/26/2021   NSTEMI (non-ST elevated myocardial infarction) ([place]) 08/22/2021   Nausea vomiting and diarrhea 08/22/2021   AKI (acute kidney injury) ([place]) 08/22/2021   CAP (community acquired pneumonia) 08/22/2021   Pneumonia 93/73/4287   Chronic systolic CHF (congestive heart failure) ([facility])    Abdominal cramping    History of atherosclerotic heart disease 07/15/2021   Coronary artery disease involving native coronary artery of native heart without angina pectoris 04/19/2021   Abnormal nuclear cardiac imaging test 03/08/2021   Diabetic peripheral neuropathy ([place]) 12/06/2020   Anxiety 01/27/2020   HNP (herniated nucleus pulposus), lumbar 01/13/2020   PTSD (post-traumatic stress disorder)    Polyneuropathy associated with underlying disease ([place]) 07/26/2019   Allergic rhinitis 07/26/2019   Atherosclerosis of aorta ([place]) 04/04/2019   Myalgia due to statin 11/18/2017   Depression, major, recurrent, in partial remission ([place]) 10/16/2017   Insomnia 10/16/2017   Chronic pain syndrome 09/07/2017   Spinal stenosis of lumbar region 07/08/2017   DOE (dyspnea on exertion) 12/16/2016  Family history of early CAD 12/16/2016   Other chest pain 12/16/2016   Arthritis of left acromioclavicular joint 08/13/2016   Left shoulder pain 05/12/2016   Cervical spondylosis without myelopathy 05/12/2016    Rotator cuff syndrome, left 05/12/2016   Restless leg syndrome 05/12/2016   Primary osteoarthritis, left hand 05/12/2016   MELENA 10/18/2008   GASTROPARESIS 05/30/2008   Type 2 diabetes mellitus with neurological complications ([place]) 69/45/0388   HYPERCHOLESTEROLEMIA 05/04/2008   HYPOKALEMIA 05/04/2008   GERD 05/04/2008   Constipation 05/04/2008   HEMATEMESIS 05/04/2008   Essential hypertension 05/04/2008     Discharged Condition: good  HPI: At time of cardiothoracic surgical consultation    After cardiac catheterization approximately 6 months ago she has been seen several times regarding her two-vessel coronary disease.  She has been recommended coronary bypass surgery but her anxiety and doubts that she needs surgery have prevented going forward.  She has been having some exertional as well as at rest jaw pain which are new symptoms.  Her echo has shown her ejection fraction now at approximately 40%, slightly down from 50 to 55% at the time of cath.  Echocardiogram currently also shows evidence of aortic sclerosis, but no significant gradient.   The patient was seen in the cardiology heart failure clinic last month.  She was complaining of PVCs and a Zio patch was prescribed.  She also had her beta-blocker dose increased and was started on losartan.  She took off the Zio patch because she was contacted by the company and charged several $100 as co-pay which she cannot afford.   We had a long talk about coronary bypass surgery, the location of the incisions and the expected hospital stay as well as the longer term postoperative recovery.  She is planned ahead and reserved a rehab bed at [facility] after discharge from the hospital.   She does not need re- cath.  She has had a recent echocardiogram showing EF of 40% with mild mitral regurgitation.   Patient is a non-smoker. Patient did have vein stripping of the right leg by a surgeon several years ago and will need vein  mapping prior to the procedure.   Her last hospitalization was in May for probable colitis associated with some pneumonia and she has resolved all of those symptoms.  Following full discussion of the risks and benefits the patient did agree to proceed with coronary artery surgical revascularization and she was admitted this hospitalization for the procedure.  Hospital course the patient was admitted electively and on 11/14/2021 she was taken to the operating room at which time she underwent coronary bypass grafting x 3.  The patient had no usable venous conduit as she had a previous right vein stripping and the left sided vein was too small.  Subsequently she required cryo vein for the 2 obtuse marginal grafts.  A LIMA to the LAD was also placed.  She tolerated the procedure well was taken to the surgical intensive care unit in stable condition.  Postoperative hospital course:  Patient was weaned from the ventilator using standard post operative cardiac surgical protocols without difficulty.  All routine lines, monitors and drainage devices have been discontinued in the standard fashion.  He initially did require some milrinone but this is weaned over time.  She does have an expected postoperative volume overload but has responded well to diuretics.  She does have an expected acute blood loss anemia and did require intraoperative transfusion.  Renal function has remained within normal  limits.  She did develop postoperative atrial fibrillation but has subsequently been chemically cardioverted to sinus rhythm on amiodarone.  She will be started on Plavix postoperatively after epicardial wires have been removed for protection related to use of cryopreserved vein.  On postop day #3 she was transferred to the 4 E. telemetry unit for ongoing rehab and progression. On postop day #4 she had another run of atrial fibrillation but shortly converted to normal sinus rhythm. She continued to have atrial ectopy so her  oral amiodarone dosage was increased.  Her beta-blocker has also been titrated.  She was evaluated and it was felt short term SNF placement would be in her best interest. She is felt to be surgically stable for discharge.  Consults: None  Significant Diagnostic Studies:  DG Chest 2 View  Result Date: 11/18/2021 CLINICAL DATA:  Status post coronary bypass graft graft. EXAM: CHEST - 2 VIEW COMPARISON:  November 17, 2021. FINDINGS: Stable cardiomediastinal silhouette. Stable bibasilar atelectasis or edema is noted with associated pleural effusions. No pneumothorax is noted. Bony thorax is unremarkable. IMPRESSION: Stable bibasilar opacities as described above. Electronically Signed   By: Marijo Conception M.D.   On: 11/18/2021 08:34   LONG TERM MONITOR (3-14 DAYS)  Result Date: 11/17/2021 Patch Wear Time:  4 days and 22 hours (2023-07-28T15:54:43-0400 to 2023-08-02T14:03:21-0400) Patient had a min HR of 55 bpm, max HR of 156 bpm, and avg HR of 72 bpm. Predominant underlying rhythm was Sinus Rhythm. First Degree AV Block was present. 475 Supraventricular Tachycardia runs occurred, the run with the fastest interval lasting 7 beats with a max rate of 156 bpm, the longest lasting 18 beats with an avg rate of 99 bpm. Supraventricular Tachycardia was detected within +/- 45 seconds of symptomatic patient event(s). Isolated SVEs were occasional (2.0%, 10273), SVE Couplets were rare (<1.0%, 1086), and SVE Triplets were rare (<1.0%, 559). Isolated VEs were frequent (23.1%, [id number]), VE Couplets were rare (<1.0%, 224), and VE Triplets were rare (<1.0%, 32). Ventricular Bigeminy and Trigeminy were present. Conclusion: 1. Predominant NSR 2. Frequent PVCs, 23.1% total beats. 3. Short SVT runs (?atrial tachy).  These episodes seem to be associated with palpitations.   DG Chest Port 1 View  Result Date: 11/17/2021 CLINICAL DATA:  Status post CABG EXAM: PORTABLE CHEST - 1 VIEW COMPARISON:  11/17/2011 FINDINGS: Mild  cardiomegaly. Mild interval worsening of pulmonary vascular congestion. Small bilateral pleural effusions increased in size since prior study. Interval removal of left chest tube. No pneumothorax is seen. Right IJ sheath is present. Postsurgical changes of CABG again seen. IMPRESSION: Mild interval worsening of pulmonary vascular congestion and small bilateral pleural effusions. Electronically Signed   By: Miachel Roux M.D.   On: 11/17/2021 06:36   DG Chest Port 1 View  Result Date: 11/16/2021 CLINICAL DATA:  History of CABG EXAM: PORTABLE CHEST 1 VIEW COMPARISON:  11/15/2021 FINDINGS: Pulmonary arterial catheter has been removed. Right IJ sheath remains in place. Mediastinal drain and left chest tube remain in place. Stable heart size status post sternotomy and CABG. Aortic atherosclerosis. Persistent bibasilar atelectasis with slightly improving lung aeration. Probable trace left pleural effusion. No pneumothorax. IMPRESSION: 1. Removal of pulmonary arterial catheter. Remaining support apparatus unchanged. 2. Persistent bibasilar atelectasis with slightly improving lung aeration. Electronically Signed   By: Davina Poke D.O.   On: 11/16/2021 09:47   DG Chest Port 1 View  Result Date: 11/15/2021 CLINICAL DATA:  Chest tube present EXAM: PORTABLE CHEST 1 VIEW COMPARISON:  Yesterday  FINDINGS: Tracheal and esophageal extubation with lower lung volumes. Indistinct density at the bases attributed atelectasis. No visible effusion or pneumothorax. Rightward rotation distorts cardiomediastinal contours. Stable heart size post CABG. Swan-Ganz catheter with tip at the main pulmonary artery. Chest tubes in place. IMPRESSION: Lower volumes and increased atelectasis after extubation. No visible pneumothorax. Stable remaining hardware. Electronically Signed   By: Jorje Guild M.D.   On: 11/15/2021 06:44   DG Chest Port 1 View  Result Date: 11/14/2021 CLINICAL DATA:  Post CABG EXAM: PORTABLE CHEST 1 VIEW  COMPARISON:  Chest x-ray dated November 12, 2021 FINDINGS: ETT projects over the midthoracic trachea with tip approximately 3.5 cm from the carina. Right IJ approach PA catheter overlying the main pulmonary artery. Left-sided chest tube. Enteric tube tip and side port are in the stomach. Cardiac and mediastinal contours within normal limits. Prior median sternotomy and CABG. Mild right-greater-than-left heterogeneous opacities, likely due to atelectasis. No pneumothorax. IMPRESSION: 1. Postsurgical changes of interval CABG. 2. Support device positioning as above. Electronically Signed   By: Yetta Glassman M.D.   On: 11/14/2021 15:44   ECHO INTRAOPERATIVE TEE  Result Date: 11/14/2021  *INTRAOPERATIVE TRANSESOPHAGEAL REPORT *  Patient Name:   JHORDYN HOOPINGARNER [facility] Date of Exam: 11/14/2021 Medical Rec #:  [id number]          Height:       68.0 in Accession #:    [id number]         Weight:       176.0 lb Date of Birth:  1950/07/13          BSA:          1.94 m Patient Age:    59 years           BP:           128/74 mmHg Patient Gender: F                  HR:           58 bpm. Exam Location:  Anesthesiology Transesophogeal exam was perform intraoperatively during surgical procedure. Patient was closely monitored under general anesthesia during the entirety of examination. Indications:     Coronary Artery Disease Sonographer:     Bernadene Person RDCS Performing Phys: Lohman Heleena Miceli Diagnosing Phys: Suzette Battiest MD Complications: No known complications during this procedure. POST-OP IMPRESSIONS _ Left Ventricle: The left ventricle is unchanged from pre-bypass. has normal systolic function, with an ejection fraction of 50-55%. _ Right Ventricle: The right ventricle appears unchanged from pre-bypass. _ Aorta: The aorta appears unchanged from pre-bypass. _ Left Atrium: The left atrium appears unchanged from pre-bypass. _ Left Atrial Appendage: The left atrial appendage appears unchanged from pre-bypass. _ Aortic Valve:  The aortic valve appears unchanged from pre-bypass. _ Mitral Valve: The mitral valve appears unchanged from pre-bypass. _ Tricuspid Valve: The tricuspid valve appears unchanged from pre-bypass. _ Pulmonic Valve: The pulmonic valve appears unchanged from pre-bypass. _ Interatrial Septum: The interatrial septum appears unchanged from pre-bypass. _ Interventricular Septum: The interventricular septum appears unchanged from pre-bypass. _ Pericardium: The pericardium appears unchanged from pre-bypass. PRE-OP FINDINGS  Left Ventricle: The left ventricle has mildly reduced systolic function, with an ejection fraction of 45-50%. The cavity size was normal. There is no left ventricular hypertrophy. Right Ventricle: The right ventricle has normal systolic function. The cavity was normal. There is no increase in right ventricular wall thickness. Left Atrium: Left atrial size was normal in size. No  left atrial/left atrial appendage thrombus was detected. Right Atrium: Right atrial size was normal in size. Interatrial Septum: No atrial level shunt detected by color flow Doppler. Pericardium: There is no evidence of pericardial effusion. Mitral Valve: The mitral valve is normal in structure. Mitral valve regurgitation is not visualized by color flow Doppler. Tricuspid Valve: The tricuspid valve was normal in structure. Tricuspid valve regurgitation was not visualized by color flow Doppler. Aortic Valve: The aortic valve is tricuspid Aortic valve regurgitation was not visualized by color flow Doppler. There is mild stenosis of the aortic valve. There is moderate thickening and mild calcification present on the aortic valve right coronary, left coronary and non-coronary cusps with mildly decreased mobility. Pulmonic Valve: The pulmonic valve was normal in structure. Pulmonic valve regurgitation is trivial by color flow Doppler. Aorta: There is evidence of plaque in the aortic arch. +--------------+--------++ LEFT VENTRICLE          +--------------+--------++ PLAX 2D                +--------------+--------++ LVOT diam:    2.10 cm  +--------------+--------++ LVOT Area:    3.46 cm +--------------+--------++                        +--------------+--------++ +-------------+------------++ AORTIC VALVE              +-------------+------------++ AV Vmax:     147.00 cm/s  +-------------+------------++ AV Vmean:    120.000 cm/s +-------------+------------++ AV VTI:      0.431 m      +-------------+------------++ AV Peak Grad:8.6 mmHg     +-------------+------------++ AV Mean Grad:6.0 mmHg     +-------------+------------++  +--------------+-------+ SHUNTS                +--------------+-------+ Systemic Diam:2.10 cm +--------------+-------+  Suzette Battiest MD Electronically signed by Suzette Battiest MD Signature Date/Time: 11/14/2021/2:03:45 PM    Final    DG Chest 2 View  Result Date: 11/13/2021 CLINICAL DATA:  Cardiac symptoms EXAM: CHEST - 2 VIEW COMPARISON:  Chest radiograph Aug 22, 2021 FINDINGS: Stable cardiac and mediastinal contours. No large area pulmonary consolidation. No pleural effusion or pneumothorax. Thoracic spine degenerative changes. IMPRESSION: No active cardiopulmonary disease. Electronically Signed   By: Lovey Newcomer M.D.   On: 11/13/2021 08:40   VAS [place] LOWER EXT SAPHENOUS VEIN MAPPING  Result Date: 11/12/2021 LOWER EXTREMITY VEIN MAPPING Patient Name:  LENIX BENOIST  Date of Exam:   11/12/2021 Medical Rec #: [id number]           Accession #:    [id number] Date of Birth: [birth year]           Patient Gender: F Patient Age:   81 years Exam Location:  [facility] Procedure:      VAS [place] LOWER EXTREMITY SAPHENOUS VEIN MAPPING Referring Phys: Dahlia Byes --------------------------------------------------------------------------------  Indications: Pre-CABG History:     HX of RLE vein stripping.  Limitations: tortuosity, multiple branching, and history of vein  stripping. Comparison Study: No previous exams Performing Technologist: Jody Hill RVT, RDMS  Examination Guidelines: A complete evaluation includes B-mode imaging, spectral Doppler, color Doppler, and power Doppler as needed of all accessible portions of each vessel. Bilateral testing is considered an integral part of a complete examination. Limited examinations for reoccurring indications may be performed as noted. +--------------+------------+----------------------+-------------+-------------+  RT Diameter  RT Findings          GSV  LT Diameter  LT Findings       (cm)                                           (cm)                   +--------------+------------+----------------------+-------------+-------------+      0.47                     Saphenofemoral        0.73                                                    Junction                                  +--------------+------------+----------------------+-------------+-------------+      0.32                     Proximal thigh        0.38                   +--------------+------------+----------------------+-------------+-------------+                   not           Mid thigh           0.24                                  visualized                                                  +--------------+------------+----------------------+-------------+-------------+                   not          Distal thigh                       not                     visualized                                     visualized   +--------------+------------+----------------------+-------------+-------------+                   not              Knee                           not                     visualized  visualized   +--------------+------------+----------------------+-------------+-------------+                   not           Prox calf                          not                     visualized                                     visualized   +--------------+------------+----------------------+-------------+-------------+                   not            Mid calf           0.18                                  visualized                                                  +--------------+------------+----------------------+-------------+-------------+                   not          Distal calf          0.26                                  visualized                                                  +--------------+------------+----------------------+-------------+-------------+                   not             Ankle             0.26                                  visualized                                                  +--------------+------------+----------------------+-------------+-------------+ Diagnosing physician: Deitra Mayo MD Electronically signed by Deitra Mayo MD on 11/12/2021 at 7:16:38 PM.    Final    VAS US DOPPLER PRE CABG  Result Date: 11/12/2021 PREOPERATIVE VASCULAR EVALUATION Patient Name:  Mayia Megill Popovich  Date of Exam:   11/12/2021 Medical Rec #: [id number]           Accession #:    [id number] Date of Birth: [birth year]           Patient Gender: F Patient Age:   41 years Exam Location:  [facility] Procedure:  VAS US DOPPLER PRE CABG Referring Phys: Andrika Peraza --------------------------------------------------------------------------------  Indications:      Pre-CABG. Risk Factors:     Hypertension, hyperlipidemia, Diabetes, no history of smoking,                   prior MI, coronary artery disease. Comparison Study: No previous exams Performing Technologist: Rogelia Rohrer RVT/RDMS  Examination Guidelines: A complete evaluation includes B-mode imaging, spectral Doppler, color Doppler, and power Doppler as needed of all accessible portions of each vessel. Bilateral testing is  considered an integral part of a complete examination. Limited examinations for reoccurring indications may be performed as noted.  Right Carotid Findings: +----------+--------+--------+--------+--------+------------------+           PSV cm/sEDV cm/sStenosisDescribeComments           +----------+--------+--------+--------+--------+------------------+ CCA Prox  51      12                      intimal thickening +----------+--------+--------+--------+--------+------------------+ CCA Distal59      17                      intimal thickening +----------+--------+--------+--------+--------+------------------+ ICA Prox  64      19              calcific                   +----------+--------+--------+--------+--------+------------------+ ICA Distal51      17                                         +----------+--------+--------+--------+--------+------------------+ ECA       64                                                 +----------+--------+--------+--------+--------+------------------+ +----------+--------+-------+----------------+------------+           PSV cm/sEDV cmsDescribe        Arm Pressure +----------+--------+-------+----------------+------------+ [id number]             Multiphasic, WNL             +----------+--------+-------+----------------+------------+ +---------+--------+--+--------+-+--------------+ VertebralPSV cm/s27EDV cm/s5High resistant +---------+--------+--+--------+-+--------------+ Left Carotid Findings: +----------+-------+--------+--------+-----------------------+-----------------+           PSV    EDV cm/sStenosisDescribe               Comments                    cm/s                                                            +----------+-------+--------+--------+-----------------------+-----------------+ CCA Prox  68     16                                                        +----------+-------+--------+--------+-----------------------+-----------------+ CCA Distal69     15  heterogenous           intimal                                                                   thickening        +----------+-------+--------+--------+-----------------------+-----------------+ ICA Prox  83     23              calcific                                 +----------+-------+--------+--------+-----------------------+-----------------+ ICA Distal72     22                                                       +----------+-------+--------+--------+-----------------------+-----------------+ ECA       100    0               calcific and                                                              heterogenous                             +----------+-------+--------+--------+-----------------------+-----------------+ +----------+--------+--------+----------------+------------+ SubclavianPSV cm/sEDV cm/sDescribe        Arm Pressure +----------+--------+--------+----------------+------------+           119             Multiphasic, WNL             +----------+--------+--------+----------------+------------+ +---------+--------+--+--------+--+---------+ VertebralPSV cm/s47EDV cm/s14Antegrade +---------+--------+--+--------+--+---------+  ABI Findings: +---------+------------------+-----+----------+--------+ Right    Rt Pressure (mmHg)IndexWaveform  Comment  +---------+------------------+-----+----------+--------+ Brachial 134                    triphasic          +---------+------------------+-----+----------+--------+ PTA      128               0.95 monophasic         +---------+------------------+-----+----------+--------+ DP       126               0.93 monophasic         +---------+------------------+-----+----------+--------+ Great Toe67                0.50 Abnormal            +---------+------------------+-----+----------+--------+ +---------+------------------+-----+----------+-------+ Left     Lt Pressure (mmHg)IndexWaveform  Comment +---------+------------------+-----+----------+-------+ Brachial 135                    triphasic         +---------+------------------+-----+----------+-------+ PTA      130               0.96 monophasic        +---------+------------------+-----+----------+-------+ DP  133               0.99 monophasic        +---------+------------------+-----+----------+-------+ Great Toe80                0.59 Abnormal          +---------+------------------+-----+----------+-------+ +-------+---------------+----------------+ ABI/TBIToday's ABI/TBIPrevious ABI/TBI +-------+---------------+----------------+ Right  0.95 / 0.50                     +-------+---------------+----------------+ Left   0.99 / 0.59                     +-------+---------------+----------------+  Right Doppler Findings: +--------+--------+-----+---------+--------+ Site    PressureIndexDoppler  Comments +--------+--------+-----+---------+--------+ [id number]          triphasic         +--------+--------+-----+---------+--------+  Left Doppler Findings: +--------+--------+-----+---------+--------+ Site    PressureIndexDoppler  Comments +--------+--------+-----+---------+--------+ [id number]          triphasic         +--------+--------+-----+---------+--------+  Summary: Right Carotid: Velocities in the right ICA are consistent with a 1-39% stenosis. Left Carotid: Velocities in the left ICA are consistent with a 1-39% stenosis. Vertebrals:  Left vertebral artery demonstrates antegrade flow. Right vertebral              artery demonstrates high resistant flow. Subclavians: Normal flow hemodynamics were seen in bilateral subclavian              arteries. Right ABI: The right toe-brachial index is abnormal. Although ABI is within  normal limits (0.95-1.29), arterial doppler waveforms at the ankle suggest some component of arterial occlusive disease. Left ABI: The left toe-brachial index is abnormal. Although ABI is within normal limits (0.95-1.29), arterial doppler waveforms at the ankle suggest some component of arterial occlusive disease. Bilateral Extremity: Doppler waveforms remain within normal limits with compression bilaterally for the radial arteries. Doppler waveforms remain within normal limits with compression bilaterally for the ulnar arteries.  Electronically signed by Deitra Mayo MD on 11/12/2021 at 7:16:01 PM.    Final      Treatments: surgery:   Operative Report    DATE OF PROCEDURE: 11/14/2021   OPERATION:   1.  Coronary artery bypass grafting x3 (left internal mammary artery to LAD, cryopreserved saphenous vein to OM1 and OM2). 2.  Unsuccessful attempt for harvest of left leg greater saphenous vein due to absence of adequate vein conduit.   PREOPERATIVE DIAGNOSES: Two-vessel coronary artery disease with exertional angina and moderate LV dysfunction.   POSTOPERATIVE DIAGNOSES: Two-vessel coronary artery disease with exertional angina and moderate LV dysfunction.   SURGEON:  Len Childs, MD   ASSISTANT:  Jadene Pierini, PA-C. Discharge Exam: Blood pressure (!) 107/56, pulse 79, temperature 97.8 F (36.6 C), temperature source Oral, resp. rate 14, height 5' 8"  (1.727 m), weight 78.2 kg, SpO2 97 %.  General appearance: alert, cooperative, and no distress Heart: regular rate and rhythm Lungs: clear to auscultation bilaterally Abdomen: soft, non-tender; bowel sounds normal; no masses,  no organomegaly Extremities: edema trace Wound: clean and dry   Discharge Medications:  The patient has been discharged on:   1.Beta Blocker:  Yes [  y ]                              No   [   ]  If No, reason:  2.Ace Inhibitor/ARB: Yes [   ]                                      No  [    ]                                     If No, reason:labile BP at times, no significant evidence of HTN during post op period  3.Statin:   Yes [   ]                  No  [ x  ]                  If No, reason: no intolerance  4.Shela CommonsVelta Addison  [ y  ]                  No   [  ]                  If No, reason:  Patient had ACS upon admission: No  Plavix/P2Y12 inhibitor: Yes [ y  ] due to cryopreserve vein                                      No  [   ]   Discharge Instructions     Amb Referral to Cardiac Rehabilitation   Complete by: As directed    Diagnosis: CABG   CABG X ___: 3   After initial evaluation and assessments completed: Virtual Based Care may be provided alone or in conjunction with Phase 2 Cardiac Rehab based on patient barriers.: Yes      Allergies as of 11/22/2021       Reactions   Morphine And Related Itching   With high doses   Other Itching   Statins    Hx intolerance to multiple statins   Penicillins Rash   Has patient had a PCN reaction causing immediate rash, facial/tongue/throat swelling, SOB or lightheadedness with hypotension: Yes Has patient had a PCN reaction causing severe rash involving mucus membranes or skin necrosis: No Has patient had a PCN reaction that required hospitalization: No Has patient had a PCN reaction occurring within the last 10 years: No If all of the above answers are "NO", then may proceed with Cephalosporin use.        Medication List     STOP taking these medications    AZO-CRANBERRY PO   furosemide 20 MG tablet Commonly known as: LASIX   losartan 25 MG tablet Commonly known as: COZAAR   Magnesium 500 MG Tabs   metoprolol succinate 25 MG 24 hr tablet Commonly known as: Toprol XL   omeprazole 40 MG capsule Commonly known as: PRILOSEC       TAKE these medications    Accu-Chek Aviva Soln Use as directed   albuterol 108 (90 Base) MCG/ACT inhaler Commonly known as: VENTOLIN HFA INHALE 2 PUFFS  INTO THE LUNGS EVERY 6 HOURS AS NEEDED FOR WHEEZING OR SHORTNESS OF BREATH   amiodarone 200 MG tablet Commonly known as: PACERONE Take 1 tablet (200 mg total) by mouth 2 (two) times daily.   aspirin EC 81 MG tablet  Take 81 mg by mouth daily. Swallow whole.   B-D SINGLE USE SWABS REGULAR Pads Use to test blood sugar 3 times daily   clonazePAM 1 MG tablet Commonly known as: KLONOPIN TAKE 1 TABLET(1 MG) BY MOUTH TWICE DAILY AS NEEDED FOR ANXIETY   clopidogrel 75 MG tablet Commonly known as: PLAVIX Take 1 tablet (75 mg total) by mouth daily.   ezetimibe 10 MG tablet Commonly known as: ZETIA TAKE 1 TABLET(10 MG) BY MOUTH DAILY   fluticasone 50 MCG/ACT nasal spray Commonly known as: FLONASE Place 2 sprays into both nostrils daily.   gabapentin 600 MG tablet Commonly known as: NEURONTIN TAKE 1 TABLET BY MOUTH EVERY MORNING AND AT NOON AND EVERY EVENING AND AT BEDTIME   guaiFENesin 600 MG 12 hr tablet Commonly known as: MUCINEX Take 600 mg by mouth daily.   HYDROcodone-acetaminophen 10-325 MG tablet Commonly known as: NORCO Take 1 tablet by mouth every 6 (six) hours as needed. Change in Sig: Do not fill before 11/14/2021   Jardiance 10 MG Tabs tablet Generic drug: empagliflozin Take 1 tablet (10 mg total) by mouth daily.   loratadine 10 MG tablet Commonly known as: CLARITIN Take 10 mg by mouth daily.   magnesium oxide 400 (240 Mg) MG tablet Commonly known as: MAG-OX Take 1 tablet (400 mg total) by mouth 2 (two) times daily for 14 days.   melatonin 5 MG Tabs Take 1 tablet (5 mg total) by mouth at bedtime as needed. What changed:  medication strength how much to take when to take this reasons to take this   metoprolol tartrate 25 MG tablet Commonly known as: LOPRESSOR Take 1 tablet (25 mg total) by mouth 2 (two) times daily.   multivitamin-lutein Caps capsule Take 1 capsule by mouth daily.   mupirocin ointment 2 % Commonly known as: BACTROBAN Apply 1  Application topically daily as needed (skin cancer).   ondansetron 4 MG disintegrating tablet Commonly known as: ZOFRAN-ODT Take 1 tablet (4 mg total) by mouth daily as needed.   onetouch ultrasoft lancets Use to test blood sugar 1-2 times daily.   OneTouch Verio Flex System w/Device Kit Use to test blood sugars 1-2 times daily.   OneTouch Verio test strip Generic drug: glucose blood Use to test blood sugar 1-2 times daily.   pantoprazole 40 MG tablet Commonly known as: PROTONIX Take 1 tablet (40 mg total) by mouth daily.   potassium chloride SA 20 MEQ tablet Commonly known as: KLOR-CON M TAKE 1 TABLET BY MOUTH EVERY DAY   QUEtiapine 100 MG tablet Commonly known as: SEROQUEL TAKE 1/2 TABLET BY MOUTH AT BEDTIME.   Systane 0.4-0.3 % Soln Generic drug: Polyethyl Glycol-Propyl Glycol Place 1 drop into both eyes daily as needed (Dry eye).   triamcinolone 0.1 % paste Commonly known as: KENALOG Use as directed 1 Application in the mouth or throat daily as needed (mouth pain).   Trulicity 1.5 ZO/1.0RU Sopn Generic drug: Dulaglutide Inject 1.5 mg into the skin once a week.        Follow-up Information     Dahlia Byes, MD Follow up.   Specialty: Cardiothoracic Surgery Why: Please see discharge paperwork for follow-up instructions and appointment to see your surgeon.  On the date you see Dr. Darcey Nora, please obtain a chest x-ray at [facility] 1/2-hour prior to the appointment.  It is located in the same office complex on the first floor. Contact information: [place] [address] [phone]  Larey Dresser, MD Follow up.   Specialty: Cardiology Why: Please see discharge paperwork for instructions when to see cardiology.  Please also obtain a MYCHART Account so you can keep track of all of your follow-up appointments. Contact information: [address] [place] [address] [phone]                  Signed:  Note By: Jadene Pierini PA-C  Med list/exam by:  Nani Skillern, PA-C  11/22/2021, 9:20 AM    patient examined and medical record reviewed,agree with above note. DC instructions reviewed with patient. Dahlia Byes 11/22/2021

## 2021-11-15 NOTE — Hospital Course (Signed)
HPI: At time of cardiothoracic surgical consultation    After cardiac catheterization approximately 6 months ago she has been seen several times regarding her two-vessel coronary disease.  She has been recommended coronary bypass surgery but her anxiety and doubts that she needs surgery have prevented going forward.  She has been having some exertional as well as at rest jaw pain which are new symptoms.  Her echo has shown her ejection fraction now at approximately 40%, slightly down from 50 to 55% at the time of cath.  Echocardiogram currently also shows evidence of aortic sclerosis, but no significant gradient.   The patient was seen in the cardiology heart failure clinic last month.  She was complaining of PVCs and a Zio patch was prescribed.  She also had her beta-blocker dose increased and was started on losartan.  She took off the Zio patch because she was contacted by the company and charged several $100 as co-pay which she cannot afford.   We had a long talk about coronary bypass surgery, the location of the incisions and the expected hospital stay as well as the longer term postoperative recovery.  She is planned ahead and reserved a rehab bed at [facility] after discharge from the hospital.   She does not need re- cath.  She has had a recent echocardiogram showing EF of 40% with mild mitral regurgitation.   Patient is a non-smoker. Patient did have vein stripping of the right leg by a surgeon several years ago and will need vein mapping prior to the procedure.   Her last hospitalization was in May for probable colitis associated with some pneumonia and she has resolved all of those symptoms.  Following full discussion of the risks and benefits the patient did agree to proceed with coronary artery surgical revascularization and she was admitted this hospitalization for the procedure.  Hospital course the patient was admitted electively and on 11/14/2021 she was taken to the  operating room at which time she underwent coronary bypass grafting x 3.  The patient had no usable venous conduit as she had a previous right vein stripping and the left sided vein was too small.  Subsequently she required cryo vein for the 2 obtuse marginal grafts.  A LIMA to the LAD was also placed.  She tolerated the procedure well was taken to the surgical intensive care unit in stable condition.  Postoperative hospital course:  Patient was weaned from the ventilator using standard post operative cardiac surgical protocols without difficulty.  All routine lines, monitors and drainage devices have been discontinued in the standard fashion.  He initially did require some milrinone but this is weaned over time.  She does have an expected postoperative volume overload but has responded well to diuretics.  She does have an expected acute blood loss anemia and did require intraoperative transfusion.  Renal function has remained within normal limits.

## 2021-11-15 NOTE — Progress Notes (Signed)
CT surgery PM rounds  Patient having postsurgical pain but up in chair for 2 hours Pulmonary status stable Developed atrial fibrillation now on IV amiodarone with controlled A-fib P.m. labs reviewed and are stable, supplement potassium 3.6 with IV runsx2  Blood pressure 124/63, pulse 72, temperature 98.2 F (36.8 C), resp. rate 14, height '5\' 8"'$  (1.727 m), weight 79.1 kg, SpO2 95 %.

## 2021-11-16 ENCOUNTER — Inpatient Hospital Stay (HOSPITAL_COMMUNITY): Payer: Medicare Other

## 2021-11-16 LAB — BASIC METABOLIC PANEL
Anion gap: 5 (ref 5–15)
BUN: 21 mg/dL (ref 8–23)
CO2: 25 mmol/L (ref 22–32)
Calcium: 8 mg/dL — ABNORMAL LOW (ref 8.9–10.3)
Chloride: 105 mmol/L (ref 98–111)
Creatinine, Ser: 1.07 mg/dL — ABNORMAL HIGH (ref 0.44–1.00)
GFR, Estimated: 56 mL/min — ABNORMAL LOW (ref >60)
Glucose, Bld: 119 mg/dL — ABNORMAL HIGH (ref 70–99)
Potassium: 3.8 mmol/L (ref 3.5–5.1)
Sodium: 135 mmol/L (ref 135–145)

## 2021-11-16 LAB — CBC
HCT: 23.5 % — ABNORMAL LOW (ref 36.0–46.0)
Hemoglobin: 7.9 g/dL — ABNORMAL LOW (ref 12.0–15.0)
MCH: 32.5 pg (ref 26.0–34.0)
MCHC: 33.6 g/dL (ref 30.0–36.0)
MCV: 96.7 fL (ref 80.0–100.0)
Platelets: 101 10*3/uL — ABNORMAL LOW (ref 150–400)
RBC: 2.43 MIL/uL — ABNORMAL LOW (ref 3.87–5.11)
RDW: 12.1 % (ref 11.5–15.5)
WBC: 6.3 10*3/uL (ref 4.0–10.5)
nRBC: 0 % (ref 0.0–0.2)

## 2021-11-16 LAB — GLUCOSE, CAPILLARY
Glucose-Capillary: 114 mg/dL — ABNORMAL HIGH (ref 70–99)
Glucose-Capillary: 115 mg/dL — ABNORMAL HIGH (ref 70–99)
Glucose-Capillary: 120 mg/dL — ABNORMAL HIGH (ref 70–99)
Glucose-Capillary: 127 mg/dL — ABNORMAL HIGH (ref 70–99)
Glucose-Capillary: 132 mg/dL — ABNORMAL HIGH (ref 70–99)
Glucose-Capillary: 143 mg/dL — ABNORMAL HIGH (ref 70–99)
Glucose-Capillary: 98 mg/dL (ref 70–99)

## 2021-11-16 MED ORDER — AMIODARONE HCL 200 MG PO TABS
200.0000 mg | ORAL_TABLET | Freq: Two times a day (BID) | ORAL | Status: DC
  Administered 2021-11-16 – 2021-11-17 (×4): 200 mg via ORAL
  Filled 2021-11-16 (×5): qty 1

## 2021-11-16 MED ORDER — MELATONIN 5 MG PO TABS
5.0000 mg | ORAL_TABLET | Freq: Every evening | ORAL | Status: DC | PRN
Start: 2021-11-16 — End: 2021-11-22
  Administered 2021-11-16 – 2021-11-21 (×5): 5 mg via ORAL
  Filled 2021-11-16 (×6): qty 1

## 2021-11-16 MED ORDER — INSULIN ASPART 100 UNIT/ML IJ SOLN: 0.0000 [IU] | Freq: Every day | INTRAMUSCULAR | Status: DC

## 2021-11-16 MED ORDER — INSULIN ASPART 100 UNIT/ML IJ SOLN
0.0000 [IU] | Freq: Three times a day (TID) | INTRAMUSCULAR | Status: DC
  Administered 2021-11-16 – 2021-11-21 (×4): 2 [IU] via SUBCUTANEOUS
  Administered 2021-11-21 – 2021-11-22 (×2): 4 [IU] via SUBCUTANEOUS

## 2021-11-16 MED ORDER — CLONAZEPAM 0.5 MG PO TABS
1.0000 mg | ORAL_TABLET | Freq: Two times a day (BID) | ORAL | Status: DC
  Administered 2021-11-16 – 2021-11-22 (×13): 1 mg via ORAL
  Filled 2021-11-16: qty 1
  Filled 2021-11-16: qty 2
  Filled 2021-11-16: qty 1
  Filled 2021-11-16 (×4): qty 2
  Filled 2021-11-16: qty 1
  Filled 2021-11-16: qty 2
  Filled 2021-11-16: qty 1
  Filled 2021-11-16 (×4): qty 2

## 2021-11-16 MED ORDER — QUETIAPINE FUMARATE 25 MG PO TABS
50.0000 mg | ORAL_TABLET | Freq: Every day | ORAL | Status: DC
  Administered 2021-11-16 – 2021-11-21 (×6): 50 mg via ORAL
  Filled 2021-11-16: qty 2
  Filled 2021-11-16: qty 1
  Filled 2021-11-16 (×3): qty 2
  Filled 2021-11-16: qty 1
  Filled 2021-11-16: qty 2

## 2021-11-16 MED ORDER — POTASSIUM CHLORIDE 10 MEQ/50ML IV SOLN
10.0000 meq | INTRAVENOUS | Status: AC
  Administered 2021-11-16 (×2): 10 meq via INTRAVENOUS
  Filled 2021-11-16 (×2): qty 50

## 2021-11-16 MED ORDER — LIDOCAINE 5 % EX PTCH
2.0000 | MEDICATED_PATCH | CUTANEOUS | Status: DC
  Administered 2021-11-16 – 2021-11-22 (×7): 2 via TRANSDERMAL
  Filled 2021-11-16 (×8): qty 2

## 2021-11-16 NOTE — Evaluation (Addendum)
Physical Therapy Evaluation Patient Details Name: [name] MRN: [id number] DOB: 07/28/1950 Today's Date: 11/16/2021  History of Present Illness  71 year old female admitted 8/10 to have CABG x 3.  REcent fall when leaving MD office with abrasion on knee.  PMH:  never smoker, post-operative N/V, HTN, HLD, CAD, chronic systolic CHF/cardiomyopathy (in setting of unrevascularized CAD and 23% burden PVCs 10/2021), murmur (mild MR, mild AS 08/2021 echo), palpitations (PVCs), DM2, tinnitus (left), GERD, adrenal gland cyst (stable calcified left adrenal lesions 08/23/21 CT), PTSD (by notes, following murder of her son), spinal surgery (redo L4-5 laminectomy & L4-5 PLIF 01/13/20), RLE vein stripping.  Clinical Impression  Pt admitted with above diagnosis. Pt was able to ambulate with EVa walker into hallway but did need to sit in chair due to nausea.  Pt needs max encouragement as she is very anxious however with incr time and encouragement, she did progress a bit today. Pt states her plan is to go to [facility] for therapy and then return to her [facility] center.  Pt currently with functional limitations due to the deficits listed below (see PT Problem List). Pt will benefit from skilled PT to increase their independence and safety with mobility to allow discharge to the venue listed below.          Recommendations for follow up therapy are one component of a multi-disciplinary discharge planning process, led by the attending physician.  Recommendations may be updated based on patient status, additional functional criteria and insurance authorization.  Follow Up Recommendations Skilled nursing-short term rehab (<3 hours/day) Can patient physically be transported by private vehicle: No    Assistance Recommended at Discharge Frequent or constant Supervision/Assistance  Patient can return home with the following  A lot of help with walking and/or transfers;A lot of help with  bathing/dressing/bathroom;Assistance with cooking/housework;Assist for transportation;Help with stairs or ramp for entrance    Equipment Recommendations None recommended by PT  Recommendations for Other Services       Functional Status Assessment Patient has had a recent decline in their functional status and demonstrates the ability to make significant improvements in function in a reasonable and predictable amount of time.     Precautions / Restrictions Precautions Precautions: Fall;Sternal Precaution Booklet Issued: No Precaution Comments: pacemaker, CT, catheter and O2 Restrictions Weight Bearing Restrictions: Yes (sternal precautions)      Mobility  Bed Mobility               General bed mobility comments: NT pt in chair on arrival    Transfers Overall transfer level: Needs assistance Equipment used:  (EVA walker) Transfers: Sit to/from Stand Sit to Stand: Min assist, +2 physical assistance           General transfer comment: Pt needed min assist to power up with use of pad and pt huggging pillow.  cues for precautions when sitting as well.    Ambulation/Gait Ambulation/Gait assistance: Min assist, +2 safety/equipment Gait Distance (Feet): 75 Feet Assistive device: Ethelene Hal Gait Pattern/deviations: Step-through pattern, Decreased stride length, Trunk flexed, Wide base of support, Drifts right/left, Antalgic   Gait velocity interpretation: <1.31 ft/sec, indicative of household ambulator   General Gait Details: Pt was able to ambulate to hallway with min assist and cues for posture and steering Eva walker.  Cues for stepping feet sequence as well. Pt needs encouragement. Pt very anxious and had to sit (chair follow) due to nausea.  Stairs  Wheelchair Mobility    Modified Rankin (Stroke Patients Only)       Balance Overall balance assessment: Needs assistance Sitting-balance support: No upper extremity supported, Feet  supported Sitting balance-Leahy Scale: Fair     Standing balance support: Bilateral upper extremity supported, During functional activity, Reliant on assistive device for balance Standing balance-Leahy Scale: Poor Standing balance comment: relies on Eva walker and external support for balance.                             Pertinent Vitals/Pain Pain Assessment Pain Assessment: Faces Faces Pain Scale: Hurts even more Pain Location: incision Pain Descriptors / Indicators: Grimacing, Guarding, Discomfort Pain Intervention(s): Limited activity within patient's tolerance, Monitored during session, Repositioned, Patient requesting pain meds-RN notified    Home Living Family/patient expects to be discharged to:: Private residence Living Arrangements: Alone Available Help at Discharge: Family;Friend(s);Available PRN/intermittently Type of Home: Independent living facility Home Access: Level entry       Home Layout: One level Home Equipment: Grab bars - tub/shower;Grab bars - toilet;Hand held shower head;Rolling Walker (2 wheels);Cane - single point;BSC/3in1      Prior Function Prior Level of Function : Independent/Modified Independent               ADLs Comments: drives and cooks, works part time as Environmental health practitioner: Right    Extremity/Trunk Assessment   Upper Extremity Assessment Upper Extremity Assessment: Defer to OT evaluation    Lower Extremity Assessment Lower Extremity Assessment: Generalized weakness    Cervical / Trunk Assessment Cervical / Trunk Assessment: Normal  Communication   Communication: No difficulties  Cognition Arousal/Alertness: Awake/alert Behavior During Therapy: WFL for tasks assessed/performed Overall Cognitive Status: Within Functional Limits for tasks assessed                                 General Comments: Pt reports hx of PTSD and high anxiety. Needs repetition as pt not following  therapist at times.        General Comments General comments (skin integrity, edema, etc.): 76 bpm, 94% 2L, 151/84    Exercises General Exercises - Lower Extremity Long Arc Quad: AROM, Both, 5 reps, Seated   Assessment/Plan    PT Assessment Patient needs continued PT services  PT Problem List Decreased activity tolerance;Decreased balance;Decreased mobility;Decreased knowledge of use of DME;Decreased safety awareness;Decreased knowledge of precautions;Cardiopulmonary status limiting activity;Pain       PT Treatment Interventions DME instruction;Gait training;Functional mobility training;Therapeutic activities;Therapeutic exercise;Balance training;Patient/family education    PT Goals (Current goals can be found in the Care Plan section)  Acute Rehab PT Goals Patient Stated Goal: to go home PT Goal Formulation: With patient Time For Goal Achievement: 11/30/21 Potential to Achieve Goals: Good    Frequency Min 3X/week     Co-evaluation               AM-PAC PT "6 Clicks" Mobility  Outcome Measure Help needed turning from your back to your side while in a flat bed without using bedrails?: A Lot Help needed moving from lying on your back to sitting on the side of a flat bed without using bedrails?: A Lot Help needed moving to and from a bed to a chair (including a wheelchair)?: Total Help needed standing up from a chair using your arms (e.g., wheelchair or bedside  chair)?: Total Help needed to walk in hospital room?: A Little Help needed climbing 3-5 steps with a railing? : A Lot 6 Click Score: 11    End of Session Equipment Utilized During Treatment: Gait belt;Oxygen Activity Tolerance: Patient limited by fatigue Patient left: in chair;with call bell/phone within reach;with chair alarm set;with nursing/sitter in room Nurse Communication: Mobility status;Patient requests pain meds PT Visit Diagnosis: Unsteadiness on feet (R26.81);Muscle weakness (generalized)  (M62.81);Pain Pain - part of body:  (incision)    Time: 7209-1068 PT Time Calculation (min) (ACUTE ONLY): 40 min   Charges:   PT Evaluation $PT Eval Moderate Complexity: 1 Mod PT Treatments $Gait Training: 8-22 mins $Therapeutic Exercise: 8-22 mins        [facility] M,PT Acute Rehab Services [phone]   Alvira Philips 11/16/2021, 1:34 PM

## 2021-11-16 NOTE — Progress Notes (Signed)
CT surgery PM rounds  Patient had a good day and ambulated in hallway. Sinus rhythm with PACs on oral amiodarone Pulmonary status improved on 1 L nasal cannula  Blood pressure 125/69, pulse 96, temperature 98.7 F (37.1 C), temperature source Oral, resp. rate 17, height '5\' 8"'$  (1.727 m), weight 79.1 kg, SpO2 98 %.

## 2021-11-16 NOTE — Progress Notes (Signed)
2 Days Post-Op Procedure(s) (LRB): CORONARY ARTERY BYPASS GRAFTING (CABG) TIMES THREE USING LIMA AND CRYO VEIN. (N/A) TRANSESOPHAGEAL ECHOCARDIOGRAM (TEE) (N/A) Subjective: Feels better Some low back pain  Objective: Vital signs in last 24 hours: Temp:  [97.9 F (36.6 C)-98.5 F (36.9 C)] 97.9 F (36.6 C) (08/12 0800) Pulse Rate:  [66-105] 72 (08/12 0700) Cardiac Rhythm: Normal sinus rhythm (08/11 1930) Resp:  [14-28] 22 (08/12 0700) BP: (108-156)/(44-72) 156/72 (08/12 0700) SpO2:  [91 %-99 %] 97 % (08/12 0700)  Hemodynamic parameters for last 24 hours:  Nsr with PACs  Intake/Output from previous day: 08/11 0701 - 08/12 0700 In: 1151.2 [I.V.:731; IV Piggyback:420.1] Out: 3557 [Urine:1150; Chest Tube:565] Intake/Output this shift: No intake/output data recorded.       Exam    General- alert and comfortable    Neck- no JVD, no cervical adenopathy palpable, no carotid bruit   Lungs- clear without rales, wheezes   Cor- regular rate and rhythm, no murmur , gallop   Abdomen- soft, non-tender   Extremities - warm, non-tender, minimal edema   Neuro- oriented, appropriate, no focal weakness   Lab Results: Recent Labs    11/15/21 1601 11/16/21 0357  WBC 8.3 6.3  HGB 8.5* 7.9*  HCT 25.0* 23.5*  PLT 119* 101*   BMET:  Recent Labs    11/15/21 1601 11/16/21 0528  NA 137 135  K 3.7 3.8  CL 106 105  CO2 26 25  GLUCOSE 152* 119*  BUN 19 21  CREATININE 1.05* 1.07*  CALCIUM 8.0* 8.0*    PT/INR:  Recent Labs    11/14/21 1449  LABPROT 16.9*  INR 1.4*   ABG    Component Value Date/Time   PHART 7.353 11/14/2021 2329   HCO3 24.4 11/14/2021 2329   TCO2 26 11/14/2021 2329   ACIDBASEDEF 1.0 11/14/2021 2329   O2SAT 99 11/14/2021 2329   CBG (last 3)  Recent Labs    11/16/21 0029 11/16/21 0406 11/16/21 0824  GLUCAP 143* 127* 115*    Assessment/Plan: S/P Procedure(s) (LRB): CORONARY ARTERY BYPASS GRAFTING (CABG) TIMES THREE USING LIMA AND CRYO VEIN.  (N/A) TRANSESOPHAGEAL ECHOCARDIOGRAM (TEE) (N/A) Mobilize Diuresis Diabetes control d/c tubes/lines DC milrinone. Transition to po amiodarone   LOS: 2 days    Dahlia Byes 11/16/2021

## 2021-11-17 ENCOUNTER — Inpatient Hospital Stay (HOSPITAL_COMMUNITY): Payer: Medicare Other

## 2021-11-17 LAB — BASIC METABOLIC PANEL
Anion gap: 4 — ABNORMAL LOW (ref 5–15)
Anion gap: 4 — ABNORMAL LOW (ref 5–15)
BUN: 18 mg/dL (ref 8–23)
BUN: 17 mg/dL (ref 8–23)
CO2: 28 mmol/L (ref 22–32)
CO2: 29 mmol/L (ref 22–32)
Calcium: 7.9 mg/dL — ABNORMAL LOW (ref 8.9–10.3)
Calcium: 8.1 mg/dL — ABNORMAL LOW (ref 8.9–10.3)
Chloride: 105 mmol/L (ref 98–111)
Chloride: 105 mmol/L (ref 98–111)
Creatinine, Ser: 0.91 mg/dL (ref 0.44–1.00)
Creatinine, Ser: 0.83 mg/dL (ref 0.44–1.00)
GFR, Estimated: >60 mL/min (ref >60)
GFR, Estimated: >60 mL/min (ref >60)
Glucose, Bld: 135 mg/dL — ABNORMAL HIGH (ref 70–99)
Glucose, Bld: 108 mg/dL — ABNORMAL HIGH (ref 70–99)
Potassium: 3.7 mmol/L (ref 3.5–5.1)
Potassium: 3.8 mmol/L (ref 3.5–5.1)
Sodium: 137 mmol/L (ref 135–145)
Sodium: 138 mmol/L (ref 135–145)

## 2021-11-17 LAB — CBC
HCT: 23.7 % — ABNORMAL LOW (ref 36.0–46.0)
Hemoglobin: 8.1 g/dL — ABNORMAL LOW (ref 12.0–15.0)
MCH: 32 pg (ref 26.0–34.0)
MCHC: 34.2 g/dL (ref 30.0–36.0)
MCV: 93.7 fL (ref 80.0–100.0)
Platelets: 105 10*3/uL — ABNORMAL LOW (ref 150–400)
RBC: 2.53 MIL/uL — ABNORMAL LOW (ref 3.87–5.11)
RDW: 12.2 % (ref 11.5–15.5)
WBC: 5.9 10*3/uL (ref 4.0–10.5)
nRBC: 0 % (ref 0.0–0.2)

## 2021-11-17 LAB — GLUCOSE, CAPILLARY
Glucose-Capillary: 109 mg/dL — ABNORMAL HIGH (ref 70–99)
Glucose-Capillary: 106 mg/dL — ABNORMAL HIGH (ref 70–99)
Glucose-Capillary: 105 mg/dL — ABNORMAL HIGH (ref 70–99)
Glucose-Capillary: 122 mg/dL — ABNORMAL HIGH (ref 70–99)

## 2021-11-17 MED ORDER — FUROSEMIDE 40 MG PO TABS
40.0000 mg | ORAL_TABLET | Freq: Every day | ORAL | Status: DC
  Administered 2021-11-18 – 2021-11-19 (×2): 40 mg via ORAL
  Filled 2021-11-17 (×2): qty 1

## 2021-11-17 MED ORDER — FE FUMARATE-B12-VIT C-FA-IFC PO CAPS
1.0000 | ORAL_CAPSULE | Freq: Three times a day (TID) | ORAL | Status: DC
  Administered 2021-11-17 – 2021-11-22 (×16): 1 via ORAL
  Filled 2021-11-17 (×15): qty 1

## 2021-11-17 MED ORDER — POTASSIUM CHLORIDE CRYS ER 20 MEQ PO TBCR
20.0000 meq | EXTENDED_RELEASE_TABLET | Freq: Two times a day (BID) | ORAL | Status: DC
  Administered 2021-11-17 – 2021-11-21 (×9): 20 meq via ORAL
  Filled 2021-11-17 (×10): qty 1

## 2021-11-17 NOTE — Progress Notes (Signed)
3 Days Post-Op Procedure(s) (LRB): CORONARY ARTERY BYPASS GRAFTING (CABG) TIMES THREE USING LIMA AND CRYO VEIN. (N/A) TRANSESOPHAGEAL ECHOCARDIOGRAM (TEE) (N/A) Subjective: Continues to progress, walked in hallway Maintaining sinus rhythm with less PACs O2 weaned to 1 L Full catheter and central line to be removed today Before transfer to stepdown Weight remains 6 pounds up, continue oral Lasix  Objective: Vital signs in last 24 hours: Temp:  [97.9 F (36.6 C)-98.7 F (37.1 C)] 98.4 F (36.9 C) (08/13 0800) Pulse Rate:  [58-96] 73 (08/13 0829) Cardiac Rhythm: Normal sinus rhythm (08/13 0800) Resp:  [11-27] 26 (08/13 0829) BP: (85-145)/(35-103) 108/65 (08/13 0829) SpO2:  [91 %-100 %] 98 % (08/13 0829) Weight:  [83.2 kg] 83.2 kg (08/13 0500)  Hemodynamic parameters for last 24 hours:    Intake/Output from previous day: 08/12 0701 - 08/13 0700 In: 480 [P.O.:480] Out: 3570 [Urine:3380; Chest Tube:190] Intake/Output this shift: Total I/O In: 240 [P.O.:240] Out: 25 [Urine:25]   Exam Comfortable sitting up in chair Lungs clear *Incision clean and dry, breast binder in place Extremities warm Sinus rhythm  Lab Results: Recent Labs    11/16/21 0357 11/17/21 0435  WBC 6.3 5.9  HGB 7.9* 8.1*  HCT 23.5* 23.7*  PLT 101* 105*   BMET:  Recent Labs    11/17/21 0037 11/17/21 0435  NA 137 138  K 3.7 3.8  CL 105 105  CO2 28 29  GLUCOSE 135* 108*  BUN 18 17  CREATININE 0.91 0.83  CALCIUM 7.9* 8.1*    PT/INR:  Recent Labs    11/14/21 1449  LABPROT 16.9*  INR 1.4*   ABG    Component Value Date/Time   PHART 7.353 11/14/2021 2329   HCO3 24.4 11/14/2021 2329   TCO2 26 11/14/2021 2329   ACIDBASEDEF 1.0 11/14/2021 2329   O2SAT 99 11/14/2021 2329   CBG (last 3)  Recent Labs    11/16/21 1619 11/16/21 2147 11/17/21 0612  GLUCAP 120* 98 109*    Assessment/Plan: S/P Procedure(s) (LRB): CORONARY ARTERY BYPASS GRAFTING (CABG) TIMES THREE USING LIMA AND CRYO  VEIN. (N/A) TRANSESOPHAGEAL ECHOCARDIOGRAM (TEE) (N/A) Mobilize Diuresis Plan for transfer to step-down: see transfer orders DC epicardial pacing wires tomorrow a.m. after breakfast then start Plavix 75 mg daily August 15   LOS: 3 days    [name] 11/17/2021

## 2021-11-17 NOTE — Progress Notes (Signed)
11/17/2021 2230 Received pt to room 4E-13 from [facility].  Pt is A&Ox4.  Tele monitor applied and CCMD notified.  CHG bath given.  Oriented to room, call light and bed.  Call bell in reach. [place]

## 2021-11-18 ENCOUNTER — Encounter (HOSPITAL_COMMUNITY): Payer: Self-pay | Admitting: Cardiothoracic Surgery

## 2021-11-18 ENCOUNTER — Inpatient Hospital Stay (HOSPITAL_COMMUNITY): Payer: Medicare Other

## 2021-11-18 LAB — BASIC METABOLIC PANEL
Anion gap: 5 (ref 5–15)
BUN: 22 mg/dL (ref 8–23)
CO2: 28 mmol/L (ref 22–32)
Calcium: 8.1 mg/dL — ABNORMAL LOW (ref 8.9–10.3)
Chloride: 105 mmol/L (ref 98–111)
Creatinine, Ser: 0.96 mg/dL (ref 0.44–1.00)
GFR, Estimated: >60 mL/min (ref >60)
Glucose, Bld: 102 mg/dL — ABNORMAL HIGH (ref 70–99)
Potassium: 4.2 mmol/L (ref 3.5–5.1)
Sodium: 138 mmol/L (ref 135–145)

## 2021-11-18 LAB — CBC
HCT: 24.7 % — ABNORMAL LOW (ref 36.0–46.0)
Hemoglobin: 8.1 g/dL — ABNORMAL LOW (ref 12.0–15.0)
MCH: 31.4 pg (ref 26.0–34.0)
MCHC: 32.8 g/dL (ref 30.0–36.0)
MCV: 95.7 fL (ref 80.0–100.0)
Platelets: 140 10*3/uL — ABNORMAL LOW (ref 150–400)
RBC: 2.58 MIL/uL — ABNORMAL LOW (ref 3.87–5.11)
RDW: 12.3 % (ref 11.5–15.5)
WBC: 5.2 10*3/uL (ref 4.0–10.5)
nRBC: 0 % (ref 0.0–0.2)

## 2021-11-18 LAB — BPAM RBC
Blood Product Expiration Date: 202308282359
Blood Product Expiration Date: 202308292359
ISSUE DATE / TIME: 202308101054
ISSUE DATE / TIME: 202308101054
Unit Type and Rh: 6200
Unit Type and Rh: 6200

## 2021-11-18 LAB — TYPE AND SCREEN
ABO/RH(D): A POS
Antibody Screen: NEGATIVE
Unit division: 0
Unit division: 0

## 2021-11-18 LAB — GLUCOSE, CAPILLARY
Glucose-Capillary: 108 mg/dL — ABNORMAL HIGH (ref 70–99)
Glucose-Capillary: 121 mg/dL — ABNORMAL HIGH (ref 70–99)
Glucose-Capillary: 108 mg/dL — ABNORMAL HIGH (ref 70–99)
Glucose-Capillary: 137 mg/dL — ABNORMAL HIGH (ref 70–99)

## 2021-11-18 MED ORDER — METOLAZONE 5 MG PO TABS
5.0000 mg | ORAL_TABLET | Freq: Every day | ORAL | Status: AC
  Administered 2021-11-18 – 2021-11-19 (×2): 5 mg via ORAL
  Filled 2021-11-18 (×2): qty 1

## 2021-11-18 MED ORDER — AMIODARONE HCL 200 MG PO TABS
400.0000 mg | ORAL_TABLET | Freq: Two times a day (BID) | ORAL | Status: DC
  Administered 2021-11-18 (×2): 400 mg via ORAL
  Filled 2021-11-18 (×2): qty 2

## 2021-11-18 MED ORDER — CLOPIDOGREL BISULFATE 75 MG PO TABS
75.0000 mg | ORAL_TABLET | Freq: Every day | ORAL | Status: DC
  Administered 2021-11-19 – 2021-11-22 (×4): 75 mg via ORAL
  Filled 2021-11-18 (×4): qty 1

## 2021-11-18 MED FILL — Magnesium Sulfate Inj 50%: INTRAMUSCULAR | Qty: 10 | Status: AC

## 2021-11-18 MED FILL — Heparin Sodium (Porcine) Inj 1000 Unit/ML: INTRAMUSCULAR | Qty: 10 | Status: AC

## 2021-11-18 MED FILL — Lidocaine HCl Local Soln Prefilled Syringe 100 MG/5ML (2%): INTRAMUSCULAR | Qty: 5 | Status: AC

## 2021-11-18 MED FILL — Electrolyte-R (PH 7.4) Solution: INTRAVENOUS | Qty: 4000 | Status: AC

## 2021-11-18 MED FILL — Heparin Sodium (Porcine) Inj 1000 Unit/ML: Qty: 1000 | Status: AC

## 2021-11-18 MED FILL — Sodium Bicarbonate IV Soln 8.4%: INTRAVENOUS | Qty: 50 | Status: AC

## 2021-11-18 MED FILL — Mannitol IV Soln 20%: INTRAVENOUS | Qty: 500 | Status: AC

## 2021-11-18 MED FILL — Sodium Chloride IV Soln 0.9%: INTRAVENOUS | Qty: 1000 | Status: AC

## 2021-11-18 MED FILL — Potassium Chloride Inj 2 mEq/ML: INTRAVENOUS | Qty: 40 | Status: AC

## 2021-11-18 NOTE — TOC Initial Note (Signed)
Transition of Care [facility]) - Initial/Assessment Note    Patient Details  Name: [name] MRN: [id number] Date of Birth: April 21, 1950  [facility]) CM/SW Contact:    Vinie Sill, LCSW Phone Number: 11/18/2021, 5:00 PM  Clinical Narrative:                  CSW met with patient. CSW introduced self and explained role. Patient confirmed she remains agreeable to short term [facility] Patient states her preferred SNF is [facility]. Patient declined sending referrals to other SNFs in the area as back up. CSW explained the SNF process. All questions answered.   CSW sent referral to [facility] for review.  TOC will continue to follow and assist with discharge planning.   Thurmond Butts, MSW, LCSW Clinical Social Worker     Expected Discharge Plan: Skilled Nursing Facility Barriers to Discharge: Ship broker, Continued Medical Work up, SNF Pending bed offer   Patient Goals and CMS Choice        Expected Discharge Plan and Services Expected Discharge Plan: [place] In-house Referral: Clinical Social Work     Living arrangements for the past 2 months: Apartment                                      Prior Living Arrangements/Services Living arrangements for the past 2 months: Apartment Lives with:: Self Patient language and need for interpreter reviewed:: No        Need for Family Participation in Patient Care: Yes (Comment)     Criminal Activity/Legal Involvement Pertinent to Current Situation/Hospitalization: No - Comment as needed  Activities of Daily Living Home Assistive Devices/Equipment: Eyeglasses, Blood pressure cuff, CBG Meter, Grab bars in shower, Scales ADL Screening (condition at time of admission) Patient's cognitive ability adequate to safely complete daily activities?: Yes Is the patient deaf or have difficulty hearing?: No Does the patient have difficulty seeing, even when wearing  glasses/contacts?: No Does the patient have difficulty concentrating, remembering, or making decisions?: No Patient able to express need for assistance with ADLs?: Yes Does the patient have difficulty dressing or bathing?: No Independently performs ADLs?: Yes (appropriate for developmental age) Does the patient have difficulty walking or climbing stairs?: Yes Weakness of Legs: None Weakness of Arms/Hands: None  Permission Sought/Granted Permission sought to share information with : Family Supports Permission granted to share information with : Yes, Verbal Permission Granted  Share Information with NAME: Lillia Abed  Permission granted to share info w AGENCY: SNF  Permission granted to share info w Relationship: brother  Permission granted to share info w Contact Information: [phone]  Emotional Assessment Appearance:: Appears stated age Attitude/Demeanor/Rapport: Engaged, Self-Confident Affect (typically observed): Accepting, Appropriate Orientation: : Oriented to Self, Oriented to Place, Oriented to  Time, Oriented to Situation Alcohol / Substance Use: Not Applicable Psych Involvement: No (comment)  Admission diagnosis:  S/P CABG x 3 [Z95.1] Patient Active Problem List   Diagnosis Date Noted   S/P CABG x 3 11/14/2021   Chest pain at rest 11/06/2021   Statin myopathy 10/18/2021   Tennis Must Quervain's disease (radial styloid tenosynovitis) 09/26/2021   NSTEMI (non-ST elevated myocardial infarction) ([place]) 08/22/2021   Nausea vomiting and diarrhea 08/22/2021   AKI (acute kidney injury) ([place]) 08/22/2021   CAP (community acquired pneumonia) 08/22/2021   Pneumonia 82/95/6213   Chronic systolic CHF (congestive heart failure) ([place])  Abdominal cramping    History of atherosclerotic heart disease 07/15/2021   Coronary artery disease involving native coronary artery of native heart without angina pectoris 04/19/2021   Abnormal nuclear cardiac imaging test 03/08/2021   Diabetic  peripheral neuropathy ([place]) 12/06/2020   Anxiety 01/27/2020   HNP (herniated nucleus pulposus), lumbar 01/13/2020   PTSD (post-traumatic stress disorder)    Polyneuropathy associated with underlying disease ([place]) 07/26/2019   Allergic rhinitis 07/26/2019   Atherosclerosis of aorta ([place]) 04/04/2019   Myalgia due to statin 11/18/2017   Depression, major, recurrent, in partial remission ([place]) 10/16/2017   Insomnia 10/16/2017   Chronic pain syndrome 09/07/2017   Spinal stenosis of lumbar region 07/08/2017   DOE (dyspnea on exertion) 12/16/2016   Family history of early CAD 12/16/2016   Other chest pain 12/16/2016   Arthritis of left acromioclavicular joint 08/13/2016   Left shoulder pain 05/12/2016   Cervical spondylosis without myelopathy 05/12/2016   Rotator cuff syndrome, left 05/12/2016   Restless leg syndrome 05/12/2016   Primary osteoarthritis, left hand 05/12/2016   MELENA 10/18/2008   GASTROPARESIS 05/30/2008   Type 2 diabetes mellitus with neurological complications ([place]) 29/56/2130   HYPERCHOLESTEROLEMIA 05/04/2008   HYPOKALEMIA 05/04/2008   GERD 05/04/2008   Constipation 05/04/2008   HEMATEMESIS 05/04/2008   Essential hypertension 05/04/2008   PCP:  Martin[place], Betty G, MD Pharmacy:   [place], [place] - [address] [place] [address] Phone: [phone] Fax: [place], [place] [address] [place] [address] Phone: [phone] Fax: [phone]     Social Determinants of Health (SDOH) Interventions    Readmission Risk Interventions     No data to display

## 2021-11-18 NOTE — Progress Notes (Addendum)
4 Days Post-Op Procedure(s) (LRB): CORONARY ARTERY BYPASS GRAFTING (CABG) TIMES THREE USING LIMA AND CRYO VEIN. (N/A) TRANSESOPHAGEAL ECHOCARDIOGRAM (TEE) (N/A) Subjective: Patient agitated over not having time to eat but otherwise has no complaints  Objective: Vital signs in last 24 hours: Temp:  [97.6 F (36.4 C)-98.5 F (36.9 C)] 97.6 F (36.4 C) (08/14 0539) Pulse Rate:  [62-101] 63 (08/14 0539) Cardiac Rhythm: Normal sinus rhythm (08/14 0156) Resp:  [15-26] 20 (08/14 0539) BP: (100-144)/(54-77) 140/58 (08/14 0539) SpO2:  [77 %-98 %] 95 % (08/14 0539) Weight:  [83 kg] 83 kg (08/14 0539)  Hemodynamic parameters for last 24 hours:    Intake/Output from previous day: 08/13 0701 - 08/14 0700 In: 483 [P.O.:480; I.V.:3] Out: 625 [Urine:625] Intake/Output this shift: No intake/output data recorded.  General appearance: alert, cooperative, and no distress Neurologic: intact Heart: NSR this morning, rate 60s Lungs: clear to auscultation bilaterally, slightly decreased BS bibasilar Abdomen: soft, non-tender; bowel sounds normal; no masses,  no organomegaly Extremities: Some edema, SCDs in place Wound: Clean and dry, breast binder in place  Lab Results: Recent Labs    11/17/21 0435 11/18/21 0509  WBC 5.9 5.2  HGB 8.1* 8.1*  HCT 23.7* 24.7*  PLT 105* 140*   BMET:  Recent Labs    11/17/21 0435 11/18/21 0509  NA 138 138  K 3.8 4.2  CL 105 105  CO2 29 28  GLUCOSE 108* 102*  BUN 17 22  CREATININE 0.83 0.96  CALCIUM 8.1* 8.1*    PT/INR: No results for input(s): "LABPROT", "INR" in the last 72 hours. ABG    Component Value Date/Time   PHART 7.353 11/14/2021 2329   HCO3 24.4 11/14/2021 2329   TCO2 26 11/14/2021 2329   ACIDBASEDEF 1.0 11/14/2021 2329   O2SAT 99 11/14/2021 2329   CBG (last 3)  Recent Labs    11/17/21 1547 11/17/21 2130 11/18/21 0536  GLUCAP 105* 122* 108*    Assessment/Plan: S/P Procedure(s) (LRB): CORONARY ARTERY BYPASS GRAFTING  (CABG) TIMES THREE USING LIMA AND CRYO VEIN. (N/A) TRANSESOPHAGEAL ECHOCARDIOGRAM (TEE) (N/A)  CV: NSR this morning, rate mid 60's with PACs on PO amiodarone. Run of atrial fibrillation last night with rate 120. Will discuss with Dr. Prescott Gum about removing EPW today and starting Plavix tomorrow. Blood pressure 140/58, continue lopressor.  Pulm: O2 saturation 97% this morning when in room, states she hasn't had any difficulty breathing. Looks like she was satting 77% on RA last night, has not happened before. Will continue to monitor.  Walked 3 times yesterday. Continue ambulation and IS.  GI: Type 2 Diabetic, glucose well controlled on SSI 105/122/108. Continue to monitor. +BM yesterday.  Renal/extremities: Cr 0.96, 7 pounds over preop weight. Will continue diuresis with lasix.   As discussed with Dr. Prescott Gum, EPW will be d/c'd today and Plavix will be started tomorrow. Amiodarone will be increased to '400mg'$  BID.     LOS: 4 days    Magdalene River, PA-C 11/18/2021 Agree with above assessment and plan as outlined by  B Stehler PA-C  Patient will need long-term Plavix 75/aspirin 81 for th two cryopreserved saphenous vein grafts  patient examined and medical record reviewed,agree with above note. Dahlia Byes 11/18/2021

## 2021-11-18 NOTE — Care Management Important Message (Signed)
Important Message  Patient Details  Name: Milta Croson Kennerson MRN: [id number] Date of Birth: [birth year]   Medicare Important Message Given:  Yes     Shelda Altes 11/18/2021, 10:52 AM

## 2021-11-18 NOTE — Progress Notes (Signed)
CARDIAC REHAB PHASE I   PRE:  Rate/Rhythm: 68 SR w/ frequent PVCs  BP:  Sitting: 131/69   SaO2: 92 RA  MODE:  Ambulation: 140 ft   POST:  Rate/Rhythm: 90, SR w/ frequent PVCs, some trigeminy  BP:  Sitting: 136/85     SaO2: 95 RA  Pt ambulated well, c/o some incisional pain on chest and groin. Pt walked with RW, 1x contact guard and SPO2 spot checked. SPO2 remained 90-93 while walking on RA. Pt returned to recliner with call bell in reach.  [place], MS 11/18/2021 11:35 AM

## 2021-11-18 NOTE — Progress Notes (Signed)
Physical Therapy Treatment Patient Details Name: [name] MRN: [id number] DOB: 11/29/1950 Today's Date: 11/18/2021   History of Present Illness 71 year old female admitted 8/10 to have CABG x 3.  REcent fall when leaving MD office with abrasion on knee.  PMH:  never smoker, post-operative N/V, HTN, HLD, CAD, chronic systolic CHF/cardiomyopathy (in setting of unrevascularized CAD and 23% burden PVCs 10/2021), murmur (mild MR, mild AS 08/2021 echo), palpitations (PVCs), DM2, tinnitus (left), GERD, adrenal gland cyst (stable calcified left adrenal lesions 08/23/21 CT), PTSD (by notes, following murder of her son), spinal surgery (redo L4-5 laminectomy & L4-5 PLIF 01/13/20), RLE vein stripping.    PT Comments    Patient making good progress with mobility and is highly motivated. Pt ambulated increased distance with stand rest break part way for total of ~250' with RW. Pt c/o some fatigue and incisional soreness, VSS throughout with HR in 90's during gait and 70-80 bpm at rest. Pt required cues for sternal precautions and assist to completed bed mobility at start of session while maintaining restrictions. EOS resting in recliner with cousin at bedside. Will continue to progress pt as able. Continue to recommend ST rehab at [facility] setting. Will progress as able.     Recommendations for follow up therapy are one component of a multi-disciplinary discharge planning process, led by the attending physician.  Recommendations may be updated based on patient status, additional functional criteria and insurance authorization.  Follow Up Recommendations  Skilled nursing-short term rehab (<3 hours/day) Can patient physically be transported by private vehicle: No   Assistance Recommended at Discharge Frequent or constant Supervision/Assistance  Patient can return home with the following A lot of help with walking and/or transfers;A lot of help with bathing/dressing/bathroom;Assistance with  cooking/housework;Assist for transportation;Help with stairs or ramp for entrance   Equipment Recommendations  None recommended by PT    Recommendations for Other Services       Precautions / Restrictions Precautions Precautions: Fall;Sternal Precaution Booklet Issued: No Precaution Comments: pacemaker, CT, catheter and O2 Restrictions Weight Bearing Restrictions: Yes (sternal precautions) RUE Weight Bearing: Partial weight bearing LUE Weight Bearing: Partial weight bearing     Mobility  Bed Mobility Overal bed mobility: Needs Assistance Bed Mobility: Supine to Sit     Supine to sit: Min assist, HOB elevated     General bed mobility comments: cues for log roll with sternal precautions, keeping UE close to body to avoid pulling/pushing. Min assist at back to help raise trunk fully. Cues to let LE's off side for assit from gravity to raise trunk.    Transfers Overall transfer level: Needs assistance Equipment used:  (EVA walker) Transfers: Sit to/from Stand Sit to Stand: Min guard           General transfer comment: VC's to maintain sternal precautions, pt hugging pillow to chest. pt abel to rise with 3 rocks forward and power up through LE's. no UE use required. guarding for safety.    Ambulation/Gait Ambulation/Gait assistance: Min assist, Min guard Gait Distance (Feet): 250 Feet Assistive device: Rolling walker (2 wheels) Gait Pattern/deviations: Step-through pattern, Decreased stride length, Trunk flexed, Wide base of support, Drifts right/left, Antalgic Gait velocity: decr     General Gait Details: Pt was able to ambulate to hallway with min assist and cues for posture with RW. Pt progressed to min guard for forward gait and required assist to postion walker with turns. Pt needs encouragement and does well with positive reinforcement and distraction. No chari  follow needed, pt's friend walked beside pt. HR stable in 90's throughout and recovered to 70-80 at  rest.   Stairs             Wheelchair Mobility    Modified Rankin (Stroke Patients Only)       Balance Overall balance assessment: Needs assistance Sitting-balance support: No upper extremity supported, Feet supported Sitting balance-Leahy Scale: Fair     Standing balance support: Bilateral upper extremity supported, During functional activity, Reliant on assistive device for balance Standing balance-Leahy Scale: Fair Standing balance comment: pt's confiendence improving, no support needed in static standing                            Cognition Arousal/Alertness: Awake/alert Behavior During Therapy: WFL for tasks assessed/performed Overall Cognitive Status: Within Functional Limits for tasks assessed                                 General Comments: Pt reports hx of PTSD and high anxiety after her sons death (murder). overall pleasant and followed instructions well.        Exercises      General Comments        Pertinent Vitals/Pain Pain Assessment Pain Assessment: Faces Faces Pain Scale: Hurts even more Pain Location: incision Pain Descriptors / Indicators: Grimacing, Guarding, Discomfort Pain Intervention(s): Limited activity within patient's tolerance, Monitored during session, Repositioned    Home Living                          Prior Function            PT Goals (current goals can now be found in the care plan section) Acute Rehab PT Goals Patient Stated Goal: to go home PT Goal Formulation: With patient Time For Goal Achievement: 11/30/21 Potential to Achieve Goals: Good Progress towards PT goals: Progressing toward goals    Frequency    Min 3X/week      PT Plan Current plan remains appropriate    Co-evaluation              AM-PAC PT "6 Clicks" Mobility   Outcome Measure  Help needed turning from your back to your side while in a flat bed without using bedrails?: A Little Help needed  moving from lying on your back to sitting on the side of a flat bed without using bedrails?: A Little Help needed moving to and from a bed to a chair (including a wheelchair)?: A Little Help needed standing up from a chair using your arms (e.g., wheelchair or bedside chair)?: A Little Help needed to walk in hospital room?: A Little Help needed climbing 3-5 steps with a railing? : A Lot 6 Click Score: 17    End of Session Equipment Utilized During Treatment: Gait belt Activity Tolerance: Patient limited by fatigue Patient left: in chair;with call bell/phone within reach;with family/visitor present Nurse Communication: Mobility status PT Visit Diagnosis: Unsteadiness on feet (R26.81);Muscle weakness (generalized) (M62.81);Pain Pain - part of body:  (incision)     Time: 1751-0258 PT Time Calculation (min) (ACUTE ONLY): 35 min  Charges:  $Gait Training: 23-37 mins                     Verner Mould, DPT Acute Rehabilitation Services Office [phone] Pager [phone]  11/18/21 3:56 PM

## 2021-11-18 NOTE — Progress Notes (Signed)
EPW discontinued per order.  Patient tolerated well.  Site CDI. Dry dressing applied Bed rest initiated.  Vital signs per protocol.  Patient aware.

## 2021-11-18 NOTE — Consult Note (Signed)
   [facility] CM Inpatient Consult   11/18/2021  Kenishia Plack Geng 04-May-1950 [id number]  [facility] [ACO] Patient: [place] Medicare  Primary Care Provider:  Martin[place], Betty G, MD, with [facility] [place], is an embedded provider with a Chronic Care Management team and program, and is listed for the transition of care follow up and appointments.  Patient was screened for high risk and readmission prevention screen for Embedded practice service needs for chronic care management/care coordination. Patient up in the hall with therapy on rounds. Notified inpatient TOC patient's request. Reviewed for disposition needs, initially recommended for [facility] rehab.   Plan: Continue to follow.  Provider office follows with TOC needs for post hospital care.  Please contact for further questions,  Natividad Brood, RN BSN [facility] Liaison  [phone] business mobile phone Toll free office [phone]  Fax number: [phone] [email or web address]

## 2021-11-19 LAB — CBC
HCT: 23.6 % — ABNORMAL LOW (ref 36.0–46.0)
Hemoglobin: 7.8 g/dL — ABNORMAL LOW (ref 12.0–15.0)
MCH: 31.5 pg (ref 26.0–34.0)
MCHC: 33.1 g/dL (ref 30.0–36.0)
MCV: 95.2 fL (ref 80.0–100.0)
Platelets: 173 10*3/uL (ref 150–400)
RBC: 2.48 MIL/uL — ABNORMAL LOW (ref 3.87–5.11)
RDW: 12.3 % (ref 11.5–15.5)
WBC: 6 10*3/uL (ref 4.0–10.5)
nRBC: 0 % (ref 0.0–0.2)

## 2021-11-19 LAB — BASIC METABOLIC PANEL
Anion gap: 7 (ref 5–15)
BUN: 22 mg/dL (ref 8–23)
CO2: 29 mmol/L (ref 22–32)
Calcium: 8.9 mg/dL (ref 8.9–10.3)
Chloride: 104 mmol/L (ref 98–111)
Creatinine, Ser: 0.9 mg/dL (ref 0.44–1.00)
GFR, Estimated: >60 mL/min (ref >60)
Glucose, Bld: 126 mg/dL — ABNORMAL HIGH (ref 70–99)
Potassium: 4 mmol/L (ref 3.5–5.1)
Sodium: 140 mmol/L (ref 135–145)

## 2021-11-19 LAB — GLUCOSE, CAPILLARY
Glucose-Capillary: 93 mg/dL (ref 70–99)
Glucose-Capillary: 123 mg/dL — ABNORMAL HIGH (ref 70–99)
Glucose-Capillary: 135 mg/dL — ABNORMAL HIGH (ref 70–99)
Glucose-Capillary: 103 mg/dL — ABNORMAL HIGH (ref 70–99)

## 2021-11-19 MED ORDER — ASPIRIN 81 MG PO TBEC
81.0000 mg | DELAYED_RELEASE_TABLET | Freq: Every day | ORAL | Status: DC
  Administered 2021-11-19 – 2021-11-21 (×3): 81 mg via ORAL
  Filled 2021-11-19 (×4): qty 1

## 2021-11-19 MED ORDER — AMIODARONE HCL 200 MG PO TABS
200.0000 mg | ORAL_TABLET | Freq: Two times a day (BID) | ORAL | Status: DC
  Administered 2021-11-19 – 2021-11-22 (×7): 200 mg via ORAL
  Filled 2021-11-19 (×7): qty 1

## 2021-11-19 MED ORDER — ASPIRIN 81 MG PO CHEW
324.0000 mg | CHEWABLE_TABLET | Freq: Every day | ORAL | Status: DC
  Administered 2021-11-22: 324 mg
  Filled 2021-11-19: qty 4

## 2021-11-19 NOTE — Progress Notes (Signed)
CARDIAC REHAB PHASE I   PRE:  Rate/Rhythm: 69, SR w/ PVC  BP:  Supine: 141/66    SaO2: 95, RA  MODE:  Ambulation: 240 ft   POST:  Rate/Rhythm: 85, SR w/ PVC  BP:  Sitting: 150/71   SaO2: 96, RA   Pt ambulated in hallway using RW and standby assistance with no s/s. Pt did express some discomfort along her chest incision, but said it was better than yesterday. Pt does well following sternal precautions when entering/exiting bed. Encouraged continued IS use, and pt left in recliner with call bell in reach.  [place], MS 11/19/2021 9:46 AM

## 2021-11-19 NOTE — Social Work (Signed)
Please be advised that the above-named patient will require a short-term nursing home stay-anticipated 30 days or less for rehabilitation and strengthening. The plan is for return home.  

## 2021-11-19 NOTE — Evaluation (Signed)
Occupational Therapy Evaluation Patient Details Name: [name] MRN: [id number] DOB: 01/19/1951 Today's Date: 11/19/2021   History of Present Illness 71 year old female admitted 8/10 to have CABG x 3.  REcent fall when leaving MD office with abrasion on knee.  PMH:  never smoker, post-operative N/V, HTN, HLD, CAD, chronic systolic CHF/cardiomyopathy (in setting of unrevascularized CAD and 23% burden PVCs 10/2021), murmur (mild MR, mild AS 08/2021 echo), palpitations (PVCs), DM2, tinnitus (left), GERD, adrenal gland cyst (stable calcified left adrenal lesions 08/23/21 CT), PTSD (by notes, following murder of her son), spinal surgery (redo L4-5 laminectomy & L4-5 PLIF 01/13/20), RLE vein stripping.   Clinical Impression   PTA, pt was living in an ILF alone, pt reports she was independent with ADL/IADL and functional mobility. Pt currently requires minguard assistance for LB dressing, grooming at sink level and during functional mobility at RW level. Pt limited by decreased activity tolerance, generalized deconditioning and decreased knowledge of sternal precautions. Due to decline in current level of function, pt would benefit from acute OT to address established goals to facilitate safe D/C to venue listed below. At this time, recommend SNF follow-up. Will continue to follow acutely.       Recommendations for follow up therapy are one component of a multi-disciplinary discharge planning process, led by the attending physician.  Recommendations may be updated based on patient status, additional functional criteria and insurance authorization.   Follow Up Recommendations  Skilled nursing-short term rehab (<3 hours/day)    Assistance Recommended at Discharge Intermittent Supervision/Assistance  Patient can return home with the following A little help with walking and/or transfers;A little help with bathing/dressing/bathroom    Functional Status Assessment  Patient has had a recent decline in  their functional status and demonstrates the ability to make significant improvements in function in a reasonable and predictable amount of time.  Equipment Recommendations  BSC/3in1    Recommendations for Other Services       Precautions / Restrictions Precautions Precautions: Fall;Sternal Precaution Booklet Issued: No Precaution Comments: pacemaker Restrictions Weight Bearing Restrictions: Yes (sternal precautions) RUE Weight Bearing: Partial weight bearing LUE Weight Bearing: Partial weight bearing      Mobility Bed Mobility               General bed mobility comments: not assessed, pt sitting in recliner upon arrival    Transfers Overall transfer level: Needs assistance Equipment used: Rolling walker (2 wheels) Transfers: Sit to/from Stand Sit to Stand: Min guard           General transfer comment: minguard for safety and stability, pt hugging pillow and completing 3 rocks forward to powerup through BLE, no use of UE during transfer      Balance Overall balance assessment: Needs assistance Sitting-balance support: No upper extremity supported, Feet supported Sitting balance-Leahy Scale: Fair     Standing balance support: Bilateral upper extremity supported, During functional activity, Reliant on assistive device for balance Standing balance-Leahy Scale: Fair Standing balance comment: pt with preference for UE support on RW                           ADL either performed or assessed with clinical judgement   ADL Overall ADL's : Needs assistance/impaired Eating/Feeding: Set up;Sitting   Grooming: Min guard;Standing   Upper Body Bathing: Set up;Sitting   Lower Body Bathing: Min guard;Sit to/from stand   Upper Body Dressing : Min guard;Sitting Upper Body Dressing Details (  indicate cue type and reason): minguard with cues for adherence to sternal precautions Lower Body Dressing: Min guard;Sit to/from stand   Toilet Transfer: Min  guard;Ambulation;Rolling walker (2 wheels)   Toileting- Clothing Manipulation and Hygiene: Min guard;Sit to/from stand Toileting - Clothing Manipulation Details (indicate cue type and reason): pt initiating good adherence to precautions with sit<>stand     Functional mobility during ADLs: Min guard;Rolling walker (2 wheels) General ADL Comments: educated pt on strategies to adhere to sternal precautions with completion of showering     Vision         Perception     Praxis      Pertinent Vitals/Pain Pain Assessment Pain Assessment: Faces Faces Pain Scale: Hurts even more Pain Location: incision Pain Descriptors / Indicators: Grimacing, Guarding, Discomfort Pain Intervention(s): Limited activity within patient's tolerance, Monitored during session     Hand Dominance Right   Extremity/Trunk Assessment Upper Extremity Assessment Upper Extremity Assessment: Overall WFL for tasks assessed;RUE deficits/detail RUE Deficits / Details: pt reports numbness/tingling in 4th and 5th digit. Pt able to feel light touch but diminished compared to LUE. educated pt on tendon nerve glides with adherence to sternal precautions. RUE Sensation: decreased light touch   Lower Extremity Assessment Lower Extremity Assessment: Generalized weakness   Cervical / Trunk Assessment Cervical / Trunk Assessment: Normal   Communication Communication Communication: No difficulties   Cognition Arousal/Alertness: Awake/alert Behavior During Therapy: WFL for tasks assessed/performed Overall Cognitive Status: Within Functional Limits for tasks assessed                                 General Comments: Pt reports hx of PTSD and high anxiety after her sons death (murder). overall pleasant and followed instructions well.     General Comments  HR 78bpm    Exercises Exercises: Other exercises Other Exercises Other Exercises: RUE tendon nerve glides withing sternal precaution parameters    Shoulder Instructions      Home Living Family/patient expects to be discharged to:: Private residence Living Arrangements: Alone Available Help at Discharge: Family;Friend(s);Available PRN/intermittently Type of Home: Independent living facility Home Access: Level entry     Home Layout: One level     Bathroom Shower/Tub: Walk-in shower;Door   [employer] Toilet: Standard     Home Equipment: Grab bars - tub/shower;Grab bars - toilet;Hand held Engineering geologist (2 wheels);Cane - single point;BSC/3in1          Prior Functioning/Environment Prior Level of Function : Independent/Modified Independent               ADLs Comments: drives and cooks, works part time as Film/video editor Problem List: Decreased activity tolerance;Impaired balance (sitting and/or standing);Decreased knowledge of precautions;Pain      OT Treatment/Interventions: Self-care/ADL training;Therapeutic exercise;Energy conservation;Therapeutic activities;Patient/family education;Balance training    OT Goals(Current goals can be found in the care plan section) Acute Rehab OT Goals Patient Stated Goal: to recover and return to prior level fo independence OT Goal Formulation: With patient Time For Goal Achievement: 12/03/21 Potential to Achieve Goals: Good ADL Goals Pt Will Perform Grooming: with modified independence;standing Pt Will Perform Lower Body Dressing: with modified independence;sit to/from stand Pt Will Transfer to Toilet: with modified independence;ambulating Additional ADL Goal #1: Pt will demonstrate independence with sternal precautions during ADL completion.  OT Frequency: Min 2X/week    Co-evaluation  AM-PAC OT "6 Clicks" Daily Activity     Outcome Measure Help from another person eating meals?: None Help from another person taking care of personal grooming?: A Little Help from another person toileting, which includes using toliet, bedpan, or urinal?: A  Little Help from another person bathing (including washing, rinsing, drying)?: A Little Help from another person to put on and taking off regular upper body clothing?: A Little Help from another person to put on and taking off regular lower body clothing?: A Little 6 Click Score: 19   End of Session Equipment Utilized During Treatment: Rolling walker (2 wheels) Nurse Communication: Mobility status  Activity Tolerance: Patient tolerated treatment well Patient left: in chair;with call bell/phone within reach  OT Visit Diagnosis: Other abnormalities of gait and mobility (R26.89);Muscle weakness (generalized) (M62.81);Pain Pain - Right/Left:  (sternum)                Time: 0350-0938 OT Time Calculation (min): 20 min Charges:  OT General Charges $OT Visit: 1 Visit OT Evaluation $OT Eval Moderate Complexity: [place] OTR/L Acute Rehabilitation Services Office: [place] 11/19/2021, 10:53 AM

## 2021-11-19 NOTE — Progress Notes (Addendum)
5 Days Post-Op Procedure(s) (LRB): CORONARY ARTERY BYPASS GRAFTING (CABG) TIMES THREE USING LIMA AND CRYO VEIN. (N/A) TRANSESOPHAGEAL ECHOCARDIOGRAM (TEE) (N/A) Subjective: Patient has some chester tenderness with movement but otherwise no complaints  Objective: Vital signs in last 24 hours: Temp:  [97.5 F (36.4 C)-97.8 F (36.6 C)] 97.6 F (36.4 C) (08/15 0345) Pulse Rate:  [64-80] 64 (08/15 0345) Cardiac Rhythm: Normal sinus rhythm (08/14 1953) Resp:  [15-20] 15 (08/15 0345) BP: (105-147)/(47-67) 123/66 (08/15 0345) SpO2:  [92 %-96 %] 94 % (08/15 0345) Weight:  [81.2 kg] 81.2 kg (08/15 0300)  Hemodynamic parameters for last 24 hours:    Intake/Output from previous day: 08/14 0701 - 08/15 0700 In: -  Out: 1200 [Urine:1200] Intake/Output this shift: No intake/output data recorded.  General appearance: alert, cooperative, and no distress Neurologic: intact Heart: Normal sinus rhythm, some PVCs Lungs: clear to auscultation bilaterally Abdomen: soft, non-tender; bowel sounds normal; no masses,  no organomegaly Extremities: Minimal edema Wound: Clean and dry  Lab Results: Recent Labs    11/18/21 0509 11/19/21 0302  WBC 5.2 6.0  HGB 8.1* 7.8*  HCT 24.7* 23.6*  PLT 140* 173   BMET:  Recent Labs    11/18/21 0509 11/19/21 0302  NA 138 140  K 4.2 4.0  CL 105 104  CO2 28 29  GLUCOSE 102* 126*  BUN 22 22  CREATININE 0.96 0.90  CALCIUM 8.1* 8.9    PT/INR: No results for input(s): "LABPROT", "INR" in the last 72 hours. ABG    Component Value Date/Time   PHART 7.353 11/14/2021 2329   HCO3 24.4 11/14/2021 2329   TCO2 26 11/14/2021 2329   ACIDBASEDEF 1.0 11/14/2021 2329   O2SAT 99 11/14/2021 2329   CBG (last 3)  Recent Labs    11/18/21 1725 11/18/21 2156 11/19/21 0627  GLUCAP 108* 137* 93    Assessment/Plan: S/P Procedure(s) (LRB): CORONARY ARTERY BYPASS GRAFTING (CABG) TIMES THREE USING LIMA AND CRYO VEIN. (N/A) TRANSESOPHAGEAL ECHOCARDIOGRAM (TEE)  (N/A)  CV: NSR this morning rate in the 60's, some PVCs, on PO amiodarone. No runs of atrial fibrillation. BP improved 123/66 today.   Pulm: Satting well at 94% on RA. Walked 3 times yesterday. Continue ambulation and IS.  GI: History of DM2, glucose stable 108/137/93. Good appetite.   Renal/extremities: Minimal edema today. Patient close to preop weight. Cr stable at 0.9. Continue PO lasix today.   Dispo: Ordering OT consult for SNF placement. Likely ready for SNF tomorrow.   LOS: 5 days    Magdalene River, PA-C 11/19/2021  DC lasix Ok for SNF tomorrow  patient examined and medical record reviewed,agree with above note. Dahlia Byes 11/19/2021

## 2021-11-19 NOTE — NC FL2 (Signed)
[place] LEVEL OF CARE SCREENING TOOL     IDENTIFICATION  Patient Name: [name] Birthdate: 09-May-1950 Sex: female Admission Date (Current Location): 11/14/2021  [facility] and [place] Number:  Herbalist and Address:  [facility] [place], [place] [address] [place],  [address]      Provider Number: [id number]  Attending Physician Name and Address:  Dahlia Byes, MD  Relative Name and Phone Number:  Lillia Abed, [phone]    Current Level of Care: Hospital Recommended Level of Care: [place] Prior Approval Number:    Date Approved/Denied:   PASRR Number: Pending  Discharge Plan: SNF    Current Diagnoses: Patient Active Problem List   Diagnosis Date Noted   S/P CABG x 3 11/14/2021   Chest pain at rest 11/06/2021   Statin myopathy 10/18/2021   Tennis Must Quervain's disease (radial styloid tenosynovitis) 09/26/2021   NSTEMI (non-ST elevated myocardial infarction) ([place]) 08/22/2021   Nausea vomiting and diarrhea 08/22/2021   AKI (acute kidney injury) ([place]) 08/22/2021   CAP (community acquired pneumonia) 08/22/2021   Pneumonia 93/81/8299   Chronic systolic CHF (congestive heart failure) ([place])    Abdominal cramping    History of atherosclerotic heart disease 07/15/2021   Coronary artery disease involving native coronary artery of native heart without angina pectoris 04/19/2021   Abnormal nuclear cardiac imaging test 03/08/2021   Diabetic peripheral neuropathy ([place]) 12/06/2020   Anxiety 01/27/2020   HNP (herniated nucleus pulposus), lumbar 01/13/2020   PTSD (post-traumatic stress disorder)    Polyneuropathy associated with underlying disease ([place]) 07/26/2019   Allergic rhinitis 07/26/2019   Atherosclerosis of aorta ([place]) 04/04/2019   Myalgia due to statin 11/18/2017   Depression, major, recurrent, in partial remission ([place]) 10/16/2017   Insomnia 10/16/2017   Chronic pain syndrome 09/07/2017   Spinal  stenosis of lumbar region 07/08/2017   DOE (dyspnea on exertion) 12/16/2016   Family history of early CAD 12/16/2016   Other chest pain 12/16/2016   Arthritis of left acromioclavicular joint 08/13/2016   Left shoulder pain 05/12/2016   Cervical spondylosis without myelopathy 05/12/2016   Rotator cuff syndrome, left 05/12/2016   Restless leg syndrome 05/12/2016   Primary osteoarthritis, left hand 05/12/2016   MELENA 10/18/2008   GASTROPARESIS 05/30/2008   Type 2 diabetes mellitus with neurological complications ([place]) 37/16/9678   HYPERCHOLESTEROLEMIA 05/04/2008   HYPOKALEMIA 05/04/2008   GERD 05/04/2008   Constipation 05/04/2008   HEMATEMESIS 05/04/2008   Essential hypertension 05/04/2008    Orientation RESPIRATION BLADDER Height & Weight     Self, Time, Situation, Place  Normal Continent Weight: 179 lb (81.2 kg) Height:  '5\' 8"'$  (172.7 cm)  BEHAVIORAL SYMPTOMS/MOOD NEUROLOGICAL BOWEL NUTRITION STATUS      Continent Diet (See DC summary)  AMBULATORY STATUS COMMUNICATION OF NEEDS Skin   Limited Assist Verbally Surgical wounds (L leg and Sternum incisions)                       Personal Care Assistance Level of Assistance  Bathing, Feeding, Dressing Bathing Assistance: Limited assistance Feeding assistance: Independent Dressing Assistance: Limited assistance     Functional Limitations Info  Sight, Hearing, Speech Sight Info: Adequate Hearing Info: Adequate Speech Info: Adequate    SPECIAL CARE FACTORS FREQUENCY  PT (By licensed PT), OT (By licensed OT)     PT Frequency: 5x week OT Frequency: 5x week            Contractures Contractures Info:  Not present    Additional Factors Info  Code Status, Allergies, Psychotropic, Insulin Sliding Scale Code Status Info: Full Allergies Info: Morphine And Related   Other   Statins   Penicillins Psychotropic Info: Clonazepam, Quetiapine Insulin Sliding Scale Info: insulin aspart (novoLOG) injection 0-24 Units 3x daily  w/ meals,       Current Medications (11/19/2021):  This is the current hospital active medication list Current Facility-Administered Medications  Medication Dose Route Frequency Provider Last Rate Last Admin   0.45 % sodium chloride infusion   Intravenous Continuous PRN Gold, Wayne E, PA-C   Paused at 11/14/21 1446   0.9 %  sodium chloride infusion  250 mL Intravenous Continuous Gold, Wayne E, PA-C   Stopped at 11/15/21 0700   0.9 %  sodium chloride infusion   Intravenous Continuous Gold, Wayne E, PA-C   Stopped at 11/15/21 1429   acetaminophen (TYLENOL) tablet 1,000 mg  1,000 mg Oral Q6H Gold, Wayne E, PA-C   1,000 mg at 11/18/21 6644   Or   acetaminophen (TYLENOL) 160 MG/5ML solution 1,000 mg  1,000 mg Per Tube Q6H Gold, Wayne E, PA-C       amiodarone (PACERONE) tablet 200 mg  200 mg Oral BID Dahlia Byes, MD   200 mg at 11/19/21 0827   aspirin EC tablet 81 mg  81 mg Oral Daily Dahlia Byes, MD   81 mg at 11/19/21 0347   Or   aspirin chewable tablet 324 mg  324 mg Per Tube Daily Dahlia Byes, MD       bisacodyl (DULCOLAX) EC tablet 10 mg  10 mg Oral Daily Gold, Wayne E, PA-C   10 mg at 11/17/21 4259   Or   bisacodyl (DULCOLAX) suppository 10 mg  10 mg Rectal Daily Gold, Wayne E, PA-C       Chlorhexidine Gluconate Cloth 2 % PADS 6 each  6 each Topical Daily Dahlia Byes, MD   6 each at 11/19/21 5638   clonazePAM (KLONOPIN) tablet 1 mg  1 mg Oral BID Dahlia Byes, MD   1 mg at 11/19/21 0844   clopidogrel (PLAVIX) tablet 75 mg  75 mg Oral Daily Magdalene River, PA-C   75 mg at 11/19/21 0827   dextrose 50 % solution 0-50 mL  0-50 mL Intravenous PRN Jadene Pierini E, PA-C       docusate sodium (COLACE) capsule 200 mg  200 mg Oral Daily Gold, Wayne E, PA-C   200 mg at 11/19/21 0827   enoxaparin (LOVENOX) injection 40 mg  40 mg Subcutaneous QHS Dahlia Byes, MD   40 mg at 11/18/21 2316   ferrous [id number] C-folic acid (TRINSICON / FOLTRIN) capsule 1 capsule  1 capsule  Oral TID Kelly Splinter, MD   1 capsule at 11/19/21 0827   furosemide (LASIX) tablet 40 mg  40 mg Oral Daily Dahlia Byes, MD   40 mg at 11/19/21 0827   gabapentin (NEURONTIN) tablet 600 mg  600 mg Oral TID Jadene Pierini E, PA-C   600 mg at 11/19/21 0827   insulin aspart (novoLOG) injection 0-24 Units  0-24 Units Subcutaneous TID WC Dahlia Byes, MD   2 Units at 11/16/21 1703   insulin aspart (novoLOG) injection 0-5 Units  0-5 Units Subcutaneous QHS Dahlia Byes, MD       lidocaine (LIDODERM) 5 % 2 patch  2 patch Transdermal Q24H Dahlia Byes, MD   2 patch at 11/19/21 0827   melatonin tablet 5 mg  5  mg Oral QHS PRN Dahlia Byes, MD   5 mg at 11/17/21 2304   metoprolol tartrate (LOPRESSOR) tablet 12.5 mg  12.5 mg Oral BID Jadene Pierini E, PA-C   12.5 mg at 11/18/21 2237   Or   metoprolol tartrate (LOPRESSOR) 25 mg/10 mL oral suspension 12.5 mg  12.5 mg Per Tube BID Gold, Wayne E, PA-C   12.5 mg at 11/19/21 8657   metoprolol tartrate (LOPRESSOR) injection 2.5-5 mg  2.5-5 mg Intravenous Q2H PRN Gold, Patrick Jupiter E, PA-C       midazolam (VERSED) injection 1 mg  1 mg Intravenous Q6H PRN Dahlia Byes, MD       ondansetron [facility]) injection 4 mg  4 mg Intravenous Q6H PRN Jadene Pierini E, PA-C   4 mg at 11/17/21 8469   Oral care mouth rinse  15 mL Mouth Rinse PRN Dahlia Byes, MD       oxyCODONE (Oxy IR/ROXICODONE) immediate release tablet 5 mg  5 mg Oral Q3H PRN Dahlia Byes, MD   5 mg at 11/18/21 1835   pantoprazole (PROTONIX) EC tablet 40 mg  40 mg Oral Daily Gold, Wayne E, PA-C   40 mg at 11/19/21 0827   potassium chloride SA (KLOR-CON M) CR tablet 20 mEq  20 mEq Oral BID Dahlia Byes, MD   20 mEq at 11/19/21 0827   QUEtiapine (SEROQUEL) tablet 50 mg  50 mg Oral QHS Dahlia Byes, MD   50 mg at 11/18/21 2237   sodium chloride flush (NS) 0.9 % injection 10-40 mL  10-40 mL Intracatheter Q12H Dahlia Byes, MD   10 mL at 11/17/21 0945   sodium chloride flush (NS) 0.9 % injection  10-40 mL  10-40 mL Intracatheter PRN Dahlia Byes, MD       sodium chloride flush (NS) 0.9 % injection 3 mL  3 mL Intravenous Q12H Gold, Wayne E, PA-C   3 mL at 11/19/21 6295   sodium chloride flush (NS) 0.9 % injection 3 mL  3 mL Intravenous PRN Gold, Wayne E, PA-C       traMADol Veatrice [place]) tablet 50-100 mg  50-100 mg Oral Q4H PRN Gold, Wayne E, PA-C   100 mg at 11/18/21 2237     Discharge Medications: Please see discharge summary for a list of discharge medications.  Relevant Imaging Results:  Relevant Lab Results:   Additional Information SS# [id number], [place]

## 2021-11-19 NOTE — Progress Notes (Signed)
Patient ambulated 240 ft in hallway with rolling walker with standby assistants. Pt tolerated well. Pt returned to recliner. Call bell in reach. Two of patients friends present.  Daymon Larsen, RN

## 2021-11-20 LAB — CBC
HCT: 25.9 % — ABNORMAL LOW (ref 36.0–46.0)
Hemoglobin: 8.6 g/dL — ABNORMAL LOW (ref 12.0–15.0)
MCH: 31.9 pg (ref 26.0–34.0)
MCHC: 33.2 g/dL (ref 30.0–36.0)
MCV: 95.9 fL (ref 80.0–100.0)
Platelets: 218 10*3/uL (ref 150–400)
RBC: 2.7 MIL/uL — ABNORMAL LOW (ref 3.87–5.11)
RDW: 12.6 % (ref 11.5–15.5)
WBC: 6.5 10*3/uL (ref 4.0–10.5)
nRBC: 0 % (ref 0.0–0.2)

## 2021-11-20 LAB — BASIC METABOLIC PANEL
Anion gap: 9 (ref 5–15)
BUN: 20 mg/dL (ref 8–23)
CO2: 29 mmol/L (ref 22–32)
Calcium: 9.2 mg/dL (ref 8.9–10.3)
Chloride: 100 mmol/L (ref 98–111)
Creatinine, Ser: 0.94 mg/dL (ref 0.44–1.00)
GFR, Estimated: >60 mL/min (ref >60)
Glucose, Bld: 123 mg/dL — ABNORMAL HIGH (ref 70–99)
Potassium: 4 mmol/L (ref 3.5–5.1)
Sodium: 138 mmol/L (ref 135–145)

## 2021-11-20 LAB — GLUCOSE, CAPILLARY
Glucose-Capillary: 100 mg/dL — ABNORMAL HIGH (ref 70–99)
Glucose-Capillary: 93 mg/dL (ref 70–99)
Glucose-Capillary: 138 mg/dL — ABNORMAL HIGH (ref 70–99)
Glucose-Capillary: 115 mg/dL — ABNORMAL HIGH (ref 70–99)

## 2021-11-20 MED ORDER — MAGNESIUM HYDROXIDE 400 MG/5ML PO SUSP: 30.0000 mL | Freq: Every day | ORAL | Status: DC | PRN

## 2021-11-20 NOTE — NC FL2 (Signed)
[place] LEVEL OF CARE SCREENING TOOL     IDENTIFICATION  Patient Name: [name] Birthdate: 1950-04-30 Sex: female Admission Date (Current Location): 11/14/2021  [facility] and [place] Number:  Herbalist and Address:  [facility], [place] [address], [place], [place] [address]      Provider Number: [id number]  Attending Physician Name and Address:  Dahlia Byes, MD  Relative Name and Phone Number:  [name], [phone]    Current Level of Care: Hospital Recommended Level of Care: [place] Prior Approval Number:    Date Approved/Denied:   PASRR Number: Pending  Discharge Plan: SNF    Current Diagnoses: Patient Active Problem List   Diagnosis Date Noted   S/P CABG x 3 11/14/2021   Chest pain at rest 11/06/2021   Statin myopathy 10/18/2021   Tennis Must Quervain's disease (radial styloid tenosynovitis) 09/26/2021   NSTEMI (non-ST elevated myocardial infarction) ([place]) 08/22/2021   Nausea vomiting and diarrhea 08/22/2021   AKI (acute kidney injury) ([place]) 08/22/2021   CAP (community acquired pneumonia) 08/22/2021   Pneumonia 71/24/5809   Chronic systolic CHF (congestive heart failure) ([place])    Abdominal cramping    History of atherosclerotic heart disease 07/15/2021   Coronary artery disease involving native coronary artery of native heart without angina pectoris 04/19/2021   Abnormal nuclear cardiac imaging test 03/08/2021   Diabetic peripheral neuropathy ([place]) 12/06/2020   Anxiety 01/27/2020   HNP (herniated nucleus pulposus), lumbar 01/13/2020   PTSD (post-traumatic stress disorder)    Polyneuropathy associated with underlying disease ([place]) 07/26/2019   Allergic rhinitis 07/26/2019   Atherosclerosis of aorta ([place]) 04/04/2019   Myalgia due to statin 11/18/2017   Depression, major, recurrent, in partial remission ([place]) 10/16/2017   Insomnia 10/16/2017   Chronic pain syndrome 09/07/2017   Spinal  stenosis of lumbar region 07/08/2017   DOE (dyspnea on exertion) 12/16/2016   Family history of early CAD 12/16/2016   Other chest pain 12/16/2016   Arthritis of left acromioclavicular joint 08/13/2016   Left shoulder pain 05/12/2016   Cervical spondylosis without myelopathy 05/12/2016   Rotator cuff syndrome, left 05/12/2016   Restless leg syndrome 05/12/2016   Primary osteoarthritis, left hand 05/12/2016   MELENA 10/18/2008   GASTROPARESIS 05/30/2008   Type 2 diabetes mellitus with neurological complications ([place]) 98/33/8250   HYPERCHOLESTEROLEMIA 05/04/2008   HYPOKALEMIA 05/04/2008   GERD 05/04/2008   Constipation 05/04/2008   HEMATEMESIS 05/04/2008   Essential hypertension 05/04/2008    Orientation RESPIRATION BLADDER Height & Weight     Self, Time, Situation, Place  Normal Continent Weight: 176 lb 1.6 oz (79.9 kg) Height:  '5\' 8"'$  (172.7 cm)  BEHAVIORAL SYMPTOMS/MOOD NEUROLOGICAL BOWEL NUTRITION STATUS      Continent Diet (See DC summary)  AMBULATORY STATUS COMMUNICATION OF NEEDS Skin   Limited Assist Verbally Surgical wounds (L leg and Sternum incisions)                       Personal Care Assistance Level of Assistance  Bathing, Feeding, Dressing Bathing Assistance: Limited assistance Feeding assistance: Independent Dressing Assistance: Limited assistance     Functional Limitations Info  Sight, Hearing, Speech Sight Info: Adequate Hearing Info: Adequate Speech Info: Adequate    SPECIAL CARE FACTORS FREQUENCY  PT (By licensed PT), OT (By licensed OT)     PT Frequency: 5x week OT Frequency: 5x week            Contractures  Contractures Info: Not present    Additional Factors Info  Code Status, Allergies, Psychotropic, Insulin Sliding Scale Code Status Info: Full Allergies Info: Morphine And Related   Other   Statins   Penicillins Psychotropic Info: Clonazepam, Quetiapine Insulin Sliding Scale Info: insulin aspart (novoLOG) injection 0-24 Units 3x  daily w/ meals,       Current Medications (11/20/2021):  This is the current hospital active medication list Current Facility-Administered Medications  Medication Dose Route Frequency Provider Last Rate Last Admin   0.45 % sodium chloride infusion   Intravenous Continuous PRN Gold, Wayne E, PA-C   Paused at 11/14/21 1446   0.9 %  sodium chloride infusion  250 mL Intravenous Continuous Gold, Wayne E, PA-C   Stopped at 11/15/21 0700   0.9 %  sodium chloride infusion   Intravenous Continuous Gold, Wayne E, PA-C   Stopped at 11/15/21 1429   amiodarone (PACERONE) tablet 200 mg  200 mg Oral BID Dahlia Byes, MD   200 mg at 11/20/21 0354   aspirin EC tablet 81 mg  81 mg Oral Daily Dahlia Byes, MD   81 mg at 11/20/21 6568   Or   aspirin chewable tablet 324 mg  324 mg Per Tube Daily Dahlia Byes, MD       bisacodyl (DULCOLAX) EC tablet 10 mg  10 mg Oral Daily Gold, Wayne E, PA-C   10 mg at 11/20/21 1275   Or   bisacodyl (DULCOLAX) suppository 10 mg  10 mg Rectal Daily Gold, Wayne E, PA-C       Chlorhexidine Gluconate Cloth 2 % PADS 6 each  6 each Topical Daily Dahlia Byes, MD   6 each at 11/19/21 1700   clonazePAM (KLONOPIN) tablet 1 mg  1 mg Oral BID Dahlia Byes, MD   1 mg at 11/20/21 1749   clopidogrel (PLAVIX) tablet 75 mg  75 mg Oral Daily Magdalene River, PA-C   75 mg at 11/20/21 0906   dextrose 50 % solution 0-50 mL  0-50 mL Intravenous PRN Jadene Pierini E, PA-C       docusate sodium (COLACE) capsule 200 mg  200 mg Oral Daily Gold, Wayne E, PA-C   200 mg at 11/20/21 0907   enoxaparin (LOVENOX) injection 40 mg  40 mg Subcutaneous QHS Dahlia Byes, MD   40 mg at 11/19/21 2114   ferrous [id number] C-folic acid (TRINSICON / FOLTRIN) capsule 1 capsule  1 capsule Oral TID Kelly Splinter, MD   1 capsule at 11/20/21 0906   gabapentin (NEURONTIN) tablet 600 mg  600 mg Oral TID Jadene Pierini E, PA-C   600 mg at 11/20/21 0906   insulin aspart (novoLOG) injection 0-24 Units   0-24 Units Subcutaneous TID WC Dahlia Byes, MD   2 Units at 11/16/21 1703   insulin aspart (novoLOG) injection 0-5 Units  0-5 Units Subcutaneous QHS Dahlia Byes, MD       lidocaine (LIDODERM) 5 % 2 patch  2 patch Transdermal Q24H Dahlia Byes, MD   2 patch at 11/20/21 0908   melatonin tablet 5 mg  5 mg Oral QHS PRN Dahlia Byes, MD   5 mg at 11/19/21 2115   metoprolol tartrate (LOPRESSOR) tablet 12.5 mg  12.5 mg Oral BID Jadene Pierini E, PA-C   12.5 mg at 11/20/21 5701   Or   metoprolol tartrate (LOPRESSOR) 25 mg/10 mL oral suspension 12.5 mg  12.5 mg Per Tube BID Jadene Pierini E, PA-C   12.5 mg at 11/19/21  5409   metoprolol tartrate (LOPRESSOR) injection 2.5-5 mg  2.5-5 mg Intravenous Q2H PRN Gold, Wayne E, PA-C       midazolam (VERSED) injection 1 mg  1 mg Intravenous Q6H PRN Dahlia Byes, MD       ondansetron [facility]) injection 4 mg  4 mg Intravenous Q6H PRN Jadene Pierini E, PA-C   4 mg at 11/17/21 8119   Oral care mouth rinse  15 mL Mouth Rinse PRN Dahlia Byes, MD       oxyCODONE (Oxy IR/ROXICODONE) immediate release tablet 5 mg  5 mg Oral Q3H PRN Dahlia Byes, MD   5 mg at 11/18/21 1835   pantoprazole (PROTONIX) EC tablet 40 mg  40 mg Oral Daily Gold, Wayne E, PA-C   40 mg at 11/20/21 1478   potassium chloride SA (KLOR-CON M) CR tablet 20 mEq  20 mEq Oral BID Dahlia Byes, MD   20 mEq at 11/20/21 0906   QUEtiapine (SEROQUEL) tablet 50 mg  50 mg Oral QHS Dahlia Byes, MD   50 mg at 11/19/21 2114   sodium chloride flush (NS) 0.9 % injection 10-40 mL  10-40 mL Intracatheter Q12H Dahlia Byes, MD   10 mL at 11/17/21 0945   sodium chloride flush (NS) 0.9 % injection 10-40 mL  10-40 mL Intracatheter PRN Dahlia Byes, MD       sodium chloride flush (NS) 0.9 % injection 3 mL  3 mL Intravenous Q12H Gold, Wayne E, PA-C   3 mL at 11/19/21 2956   sodium chloride flush (NS) 0.9 % injection 3 mL  3 mL Intravenous PRN Gold, Wayne E, PA-C       traMADol Veatrice Bourbon) tablet 50-100 mg   50-100 mg Oral Q4H PRN Jadene Pierini E, PA-C   100 mg at 11/19/21 2115     Discharge Medications: Please see discharge summary for a list of discharge medications.  Relevant Imaging Results:  Relevant Lab Results:   Additional Information SS# [id number] [place]

## 2021-11-20 NOTE — TOC Progression Note (Signed)
Transition of Care [facility]) - Progression Note    Patient Details  Name: Anvika Gashi Dornfeld MRN: [id number] Date of Birth: 1950/12/27  Transition of Care [facility]) CM/SW [place], [place] Phone Number: 11/20/2021, 2:20 PM  Clinical Narrative:     Cyndie Mull # [id number] E   Expected Discharge Plan: [place] Barriers to Discharge: Barriers Resolved  Expected Discharge Plan and Services Expected Discharge Plan: [place] In-house Referral: Clinical Social Work     Living arrangements for the past 2 months: Apartment                                       Social Determinants of Health (SDOH) Interventions    Readmission Risk Interventions     No data to display

## 2021-11-20 NOTE — Progress Notes (Addendum)
[address]       [place] [address]             [phone]      6 Days Post-Op Procedure(s) (LRB): CORONARY ARTERY BYPASS GRAFTING (CABG) TIMES THREE USING LIMA AND CRYO VEIN. (N/A) TRANSESOPHAGEAL ECHOCARDIOGRAM (TEE) (N/A) Subjective: Feels pretty well  Objective: Vital signs in last 24 hours: Temp:  [97.6 F (36.4 C)-98.7 F (37.1 C)] 97.6 F (36.4 C) (08/16 0610) Pulse Rate:  [70-85] 70 (08/16 0610) Cardiac Rhythm: Normal sinus rhythm (08/15 2000) Resp:  [15-20] 16 (08/16 0610) BP: (106-141)/(59-64) 132/62 (08/16 0610) SpO2:  [97 %-99 %] 99 % (08/16 0610) Weight:  [79.9 kg] 79.9 kg (08/16 0619)  Hemodynamic parameters for last 24 hours:    Intake/Output from previous day: 08/15 0701 - 08/16 0700 In: -  Out: 3100 [Urine:3100] Intake/Output this shift: No intake/output data recorded.  General appearance: alert, cooperative, and no distress Heart: regular rate and rhythm Lungs: min dim in bases Abdomen: benign Extremities: minor edema Wound: incis healing well  Lab Results: Recent Labs    11/19/21 0302 11/20/21 0306  WBC 6.0 6.5  HGB 7.8* 8.6*  HCT 23.6* 25.9*  PLT 173 218   BMET:  Recent Labs    11/19/21 0302 11/20/21 0306  NA 140 138  K 4.0 4.0  CL 104 100  CO2 29 29  GLUCOSE 126* 123*  BUN 22 20  CREATININE 0.90 0.94  CALCIUM 8.9 9.2    PT/INR: No results for input(s): "LABPROT", "INR" in the last 72 hours. ABG    Component Value Date/Time   PHART 7.353 11/14/2021 2329   HCO3 24.4 11/14/2021 2329   TCO2 26 11/14/2021 2329   ACIDBASEDEF 1.0 11/14/2021 2329   O2SAT 99 11/14/2021 2329   CBG (last 3)  Recent Labs    11/19/21 1715 11/19/21 2117 11/20/21 0608  GLUCAP 135* 103* 100*    Meds Scheduled Meds:  amiodarone  200 mg Oral BID   aspirin EC  81 mg Oral Daily   Or   aspirin  324 mg Per Tube Daily   bisacodyl  10 mg Oral Daily   Or   bisacodyl  10 mg Rectal Daily   Chlorhexidine Gluconate Cloth   6 each Topical Daily   clonazePAM  1 mg Oral BID   clopidogrel  75 mg Oral Daily   docusate sodium  200 mg Oral Daily   enoxaparin (LOVENOX) injection  40 mg Subcutaneous QHS   ferrous [id number] C-folic acid  1 capsule Oral TID PC   gabapentin  600 mg Oral TID   insulin aspart  0-24 Units Subcutaneous TID WC   insulin aspart  0-5 Units Subcutaneous QHS   lidocaine  2 patch Transdermal Q24H   metoprolol tartrate  12.5 mg Oral BID   Or   metoprolol tartrate  12.5 mg Per Tube BID   pantoprazole  40 mg Oral Daily   potassium chloride  20 mEq Oral BID   QUEtiapine  50 mg Oral QHS   sodium chloride flush  10-40 mL Intracatheter Q12H   sodium chloride flush  3 mL Intravenous Q12H   Continuous Infusions:  sodium chloride Stopped (11/14/21 1446)   sodium chloride Stopped (11/15/21 0700)   sodium chloride Stopped (11/15/21 1429)   PRN Meds:.sodium chloride, dextrose, melatonin, metoprolol tartrate, midazolam, ondansetron (ZOFRAN) IV, mouth rinse, oxyCODONE, sodium chloride flush, sodium chloride flush, traMADol  Xrays DG Chest 2 View  Result  Date: 11/18/2021 CLINICAL DATA:  Status post coronary bypass graft graft. EXAM: CHEST - 2 VIEW COMPARISON:  November 17, 2021. FINDINGS: Stable cardiomediastinal silhouette. Stable bibasilar atelectasis or edema is noted with associated pleural effusions. No pneumothorax is noted. Bony thorax is unremarkable. IMPRESSION: Stable bibasilar opacities as described above. Electronically Signed   By: Marijo Conception M.D.   On: 11/18/2021 08:34    Assessment/Plan: S/P Procedure(s) (LRB): CORONARY ARTERY BYPASS GRAFTING (CABG) TIMES THREE USING LIMA AND CRYO VEIN. (N/A) TRANSESOPHAGEAL ECHOCARDIOGRAM (TEE) (N/A)  1 afeb, VSS s BP 100-140's range, SR 2 sats good on RA 3 weight equal to preop 4 normal renal fxn 5 H/H improving trend of expected ABLA 6 BS well controlled- home meds at d/c 7 Short term SNF at d/c- possible bed available today     LOS: 6 days    Jeanette Giovanni PA-C Pager [phone] 11/20/2021  DC instructions reviewed with patient She understands the importance of dual anti-platelet therapy for the cryovein bypass grafts.  patient examined and medical record reviewed,agree with above note. [name] 11/20/2021

## 2021-11-20 NOTE — TOC Progression Note (Signed)
Transition of Care [facility]) - Progression Note    Patient Details  Name: [name] MRN: [id number] Date of Birth: 03-Dec-1950  Transition of Care [facility]) CM/SW [place], [place] Phone Number: 11/20/2021, 10:21 AM  Clinical Narrative:     Insurance and PASRR approval is pending  TOC will continue to following and assist with discharge planning.  Thurmond Butts, MSW, LCSW Clinical Social Worker    Expected Discharge Plan: Skilled Nursing Facility Barriers to Discharge: Awaiting State Approval (PASRR), Insurance Authorization  Expected Discharge Plan and Services Expected Discharge Plan: [place] In-house Referral: Clinical Social Work     Living arrangements for the past 2 months: Apartment                                       Social Determinants of Health (SDOH) Interventions    Readmission Risk Interventions     No data to display

## 2021-11-20 NOTE — Progress Notes (Signed)
CARDIAC REHAB PHASE I   PRE:  Rate/Rhythm: 84, SR w/ PVCs PACs  BP:  Sitting: 146/63   SaO2: 97, RA  MODE:  Ambulation: 400 ft   POST:  Rate/Rhythm: 95, SR w/ PVCs PACs  BP:  Sitting: 146/66     SaO2: 98, RA  Pt ambulated in hallway using RW and standby assist with no s/s. C/o some sternal incision soreness and fatigue at end of walk. Pt demonstrated 175m on IS, and continued use encouraged. Pt and family educated on risk factors, sternal precautions, exercise guidelines, restrictions, nutrition, and orientation to CRP2 referral at [facility]. All questions from pt and family answered, and pt left in recliner with call bell in reach.  [phone]   BColbert Ewing MS 11/20/2021 10:59 AM

## 2021-11-20 NOTE — Social Work (Addendum)
                                                                                                                          Re: [name] Date of Birth: Oct 27, 1950 Date: 11/20/2021  To Whom It May Concern:  Please be advised that the above-named patient will require a short-term nursing home stay-anticipated 30 days or less for rehabilitation and strengthening. The plan is to return home.

## 2021-11-20 NOTE — Progress Notes (Signed)
2125: when entered the room for night medication, pt was about to use bedside commode. HR went up to 130's. Irregular, sinus tacy to Afib. As patent sat down in the bed, HR began to come down. Pt stated, she could feel the heart pounding and hasn't experienced this in past couple days. While in bed HR was non-sustained 90-110's, 120's. Scheduled Po amiodarone and metoprolol administered. Currently patient resting in bed , breathing even and unlabored in RA. Denies any discomfort. HR 70's, SR/ventricular trigeminy. Plan of care continues.

## 2021-11-20 NOTE — Progress Notes (Signed)
Mobility Specialist: Progress Note   11/20/21 1613  Mobility  Activity Ambulated with assistance in hallway  Level of Assistance Minimal assist, patient does 75% or more  Assistive Device Front wheel walker  Distance Ambulated (ft) 220 ft  Activity Response Tolerated well  $Mobility charge 1 Mobility   Post-Mobility: 91 HR, 154/72 (92) BP, 96% SpO2  Pt received in the bed and agreeable to mobility. To BSC, BM successful, then hallway ambulation. Stopped x1 for brief standing break secondary to mild SOB and general fatigue. Pt to the chair after session with call bell at her side.   [facility] Matraca Hunkins Mobility Specialist Mobility Specialist [address]: [phone]

## 2021-11-20 NOTE — Progress Notes (Signed)
PT Cancellation Note  Patient Details Name: [name] MRN: [id number] DOB: 1950-05-07   Cancelled Treatment:    Reason Eval/Treat Not Completed: Other (comment) pt stating she is cold and tired from having BM and declining all mobility. Placed warm blanket for pt and will check back as schedule allows to continue with PT POC.  Audry Riles. PTA Acute Rehabilitation Services Office: [place] 11/20/2021, 3:35 PM

## 2021-11-20 NOTE — TOC Progression Note (Addendum)
Transition of Care [facility]) - Progression Note    Patient Details  Name: [name] MRN: [id number] Date of Birth: 1951-02-06  Transition of Care [facility]) CM/SW [place], [place] Phone Number: 11/20/2021, 11:42 AM  Clinical Narrative:     Patient has received insurance approval for [facility]/SNF- # [id number]  8/16-8/21.   Expected Discharge Plan: Skilled Nursing Facility Barriers to Discharge: [place] (PASRR), Insurance Authorization  Expected Discharge Plan and Services Expected Discharge Plan: [place] In-house Referral: Clinical Social Work     Living arrangements for the past 2 months: Apartment                                       Social Determinants of Health (SDOH) Interventions    Readmission Risk Interventions     No data to display

## 2021-11-20 NOTE — TOC Progression Note (Signed)
Transition of Care [facility]) - Progression Note    Patient Details  Name: [name] MRN: [id number] Date of Birth: October 28, 1950  Transition of Care [facility]) CM/SW [place], [place] Phone Number: 11/20/2021, 4:16 PM  Clinical Narrative:     CSW received insurance authorization and PASRR - [facility] can admit tomorrow if remains medically stable.   Thurmond Butts, MSW, LCSW Clinical Social Worker    Expected Discharge Plan: Skilled Nursing Facility Barriers to Discharge: Barriers Resolved  Expected Discharge Plan and Services Expected Discharge Plan: [place] In-house Referral: Clinical Social Work     Living arrangements for the past 2 months: Apartment                                       Social Determinants of Health (SDOH) Interventions    Readmission Risk Interventions     No data to display

## 2021-11-21 DIAGNOSIS — I4891 Unspecified atrial fibrillation: Secondary | ICD-10-CM

## 2021-11-21 LAB — BASIC METABOLIC PANEL
Anion gap: 11 (ref 5–15)
BUN: 16 mg/dL (ref 8–23)
CO2: 28 mmol/L (ref 22–32)
Calcium: 9.5 mg/dL (ref 8.9–10.3)
Chloride: 99 mmol/L (ref 98–111)
Creatinine, Ser: 0.99 mg/dL (ref 0.44–1.00)
GFR, Estimated: >60 mL/min (ref >60)
Glucose, Bld: 209 mg/dL — ABNORMAL HIGH (ref 70–99)
Potassium: 3.5 mmol/L (ref 3.5–5.1)
Sodium: 138 mmol/L (ref 135–145)

## 2021-11-21 LAB — GLUCOSE, CAPILLARY
Glucose-Capillary: 128 mg/dL — ABNORMAL HIGH (ref 70–99)
Glucose-Capillary: 175 mg/dL — ABNORMAL HIGH (ref 70–99)
Glucose-Capillary: 110 mg/dL — ABNORMAL HIGH (ref 70–99)
Glucose-Capillary: 129 mg/dL — ABNORMAL HIGH (ref 70–99)

## 2021-11-21 LAB — MAGNESIUM: Magnesium: 1.4 mg/dL — ABNORMAL LOW (ref 1.7–2.4)

## 2021-11-21 MED ORDER — METOPROLOL TARTRATE 25 MG/10 ML ORAL SUSPENSION
25.0000 mg | Freq: Two times a day (BID) | ORAL | Status: DC
  Filled 2021-11-21: qty 10

## 2021-11-21 MED ORDER — POTASSIUM CHLORIDE CRYS ER 20 MEQ PO TBCR
20.0000 meq | EXTENDED_RELEASE_TABLET | Freq: Two times a day (BID) | ORAL | Status: DC
Start: 2021-11-21 — End: 2021-11-22
  Administered 2021-11-21 – 2021-11-22 (×2): 20 meq via ORAL
  Filled 2021-11-21 (×2): qty 1

## 2021-11-21 MED ORDER — MAGNESIUM OXIDE -MG SUPPLEMENT 400 (240 MG) MG PO TABS
400.0000 mg | ORAL_TABLET | Freq: Two times a day (BID) | ORAL | Status: DC
  Administered 2021-11-21 – 2021-11-22 (×2): 400 mg via ORAL
  Filled 2021-11-21 (×2): qty 1

## 2021-11-21 MED ORDER — METOPROLOL TARTRATE 25 MG PO TABS
25.0000 mg | ORAL_TABLET | Freq: Two times a day (BID) | ORAL | Status: DC
  Administered 2021-11-21 – 2021-11-22 (×3): 25 mg via ORAL
  Filled 2021-11-21 (×3): qty 1

## 2021-11-21 NOTE — Progress Notes (Signed)
OT Cancellation Note  Patient Details Name: [name] MRN: [id number] DOB: 11/17/1950   Cancelled Treatment:    Reason Eval/Treat Not Completed: Patient declined, no reason specified.  Patient recently returned to bed after PT session.  OT to continue efforts.    Cresencia Asmus D Hadiyah Maricle 11/21/2021, 4:04 PM 11/21/2021  RP, OTR/L  Acute Rehabilitation Services  Office:  [phone]

## 2021-11-21 NOTE — Progress Notes (Addendum)
[address]       [place] [address]             [phone]      7 Days Post-Op Procedure(s) (LRB): CORONARY ARTERY BYPASS GRAFTING (CABG) TIMES THREE USING LIMA AND CRYO VEIN. (N/A) TRANSESOPHAGEAL ECHOCARDIOGRAM (TEE) (N/A) Subjective: Feels ok  Objective: Vital signs in last 24 hours: Temp:  [97.5 F (36.4 C)-98 F (36.7 C)] 98 F (36.7 C) (08/17 0500) Pulse Rate:  [72-91] 72 (08/17 0500) Cardiac Rhythm: Normal sinus rhythm (08/16 2010) Resp:  [16-20] 20 (08/17 0500) BP: (115-131)/(57-96) 131/58 (08/17 0500) SpO2:  [96 %-98 %] 96 % (08/17 0500) Weight:  [79.6 kg] 79.6 kg (08/17 0500)  Hemodynamic parameters for last 24 hours:    Intake/Output from previous day: 08/16 0701 - 08/17 0700 In: 720 [P.O.:720] Out: 1150 [Urine:1150] Intake/Output this shift: No intake/output data recorded.  General appearance: alert, cooperative, and no distress Heart: regular rate and rhythm Lungs: clear to auscultation bilaterally Abdomen: benign Extremities: no edema Wound: incis healing well  Lab Results: Recent Labs    11/19/21 0302 11/20/21 0306  WBC 6.0 6.5  HGB 7.8* 8.6*  HCT 23.6* 25.9*  PLT 173 218   BMET:  Recent Labs    11/19/21 0302 11/20/21 0306  NA 140 138  K 4.0 4.0  CL 104 100  CO2 29 29  GLUCOSE 126* 123*  BUN 22 20  CREATININE 0.90 0.94  CALCIUM 8.9 9.2    PT/INR: No results for input(s): "LABPROT", "INR" in the last 72 hours. ABG    Component Value Date/Time   PHART 7.353 11/14/2021 2329   HCO3 24.4 11/14/2021 2329   TCO2 26 11/14/2021 2329   ACIDBASEDEF 1.0 11/14/2021 2329   O2SAT 99 11/14/2021 2329   CBG (last 3)  Recent Labs    11/20/21 1704 11/20/21 2127 11/21/21 0556  GLUCAP 138* 115* 128*    Meds Scheduled Meds:  amiodarone  200 mg Oral BID   aspirin EC  81 mg Oral Daily   Or   aspirin  324 mg Per Tube Daily   bisacodyl  10 mg Oral Daily   Or   bisacodyl  10 mg Rectal Daily   Chlorhexidine  Gluconate Cloth  6 each Topical Daily   clonazePAM  1 mg Oral BID   clopidogrel  75 mg Oral Daily   docusate sodium  200 mg Oral Daily   enoxaparin (LOVENOX) injection  40 mg Subcutaneous QHS   ferrous [id number] C-folic acid  1 capsule Oral TID PC   gabapentin  600 mg Oral TID   insulin aspart  0-24 Units Subcutaneous TID WC   insulin aspart  0-5 Units Subcutaneous QHS   lidocaine  2 patch Transdermal Q24H   metoprolol tartrate  12.5 mg Oral BID   Or   metoprolol tartrate  12.5 mg Per Tube BID   pantoprazole  40 mg Oral Daily   potassium chloride  20 mEq Oral BID   QUEtiapine  50 mg Oral QHS   sodium chloride flush  10-40 mL Intracatheter Q12H   sodium chloride flush  3 mL Intravenous Q12H   Continuous Infusions:  sodium chloride Stopped (11/14/21 1446)   sodium chloride Stopped (11/15/21 0700)   sodium chloride Stopped (11/15/21 1429)   PRN Meds:.sodium chloride, dextrose, magnesium hydroxide, melatonin, metoprolol tartrate, midazolam, ondansetron (ZOFRAN) IV, mouth rinse, oxyCODONE, sodium chloride flush, sodium chloride flush, traMADol  Xrays No results found.  Assessment/Plan:  S/P Procedure(s) (LRB): CORONARY ARTERY BYPASS GRAFTING (CABG) TIMES THREE USING LIMA AND CRYO VEIN. (N/A) TRANSESOPHAGEAL ECHOCARDIOGRAM (TEE) (N/A)  1 afeb, VSS , episode of afib w/RVR, see nurse note- back in sinus , + trigemminy will increase lopressor dose, cont amio- will recheck K+ and MG++ 2 sats good on RA 3 good UOP 4 no new labs or CXR's 5 SNF when medically stable    LOS: 7 days    John Giovanni PA-C  Pager [phone] 11/21/2021   Titrating meds for postop afib She has long prior hx of PVCs patient examined and medical record reviewed,agree with above note. Dahlia Byes 11/21/2021

## 2021-11-21 NOTE — Progress Notes (Signed)
CARDIAC REHAB PHASE I   PRE:  Rate/Rhythm: 97 SR    BP: sitting 111/77    SaO2: 93 RA  MODE:  Ambulation: 320 ft   POST:  Rate/Rhythm: 104 ST    BP: sitting 126/76     SaO2: 93 RA  Pt stood independently and walked with RW, slow and steady. VSS, no c/o except frustration with having a set back. To recliner after BSC.  [phone]  Yves Dill BS, ACSM-CEP 11/21/2021 9:15 AM

## 2021-11-21 NOTE — Progress Notes (Signed)
Physical Therapy Treatment Patient Details Name: [name] MRN: [id number] DOB: June 22, 1950 Today's Date: 11/21/2021   History of Present Illness 71 year old female admitted 8/10 to have CABG x 3.  REcent fall when leaving MD office with abrasion on knee.  PMH:  never smoker, post-operative N/V, HTN, HLD, CAD, chronic systolic CHF/cardiomyopathy (in setting of unrevascularized CAD and 23% burden PVCs 10/2021), murmur (mild MR, mild AS 08/2021 echo), palpitations (PVCs), DM2, tinnitus (left), GERD, adrenal gland cyst (stable calcified left adrenal lesions 08/23/21 CT), PTSD (by notes, following murder of her son), spinal surgery (redo L4-5 laminectomy & L4-5 PLIF 01/13/20), RLE vein stripping.    PT Comments    Pt received OOB in recliner on arrival and agreeable to session with continued progress and focus on gait for increased activity tolerance. Pt demonstrating stable ambulation with RW and no LOB, however pt limited by general fatigue and mild c/o nausea. Pt requesting back to bed on return to room and requiring min assist for BLE management to return to supine from sidelying. Continued education re; activity recommendations ans importance of continued mobility. Pt with excellent awareness and adherence to sternal precautions throughout session. Pt continues to benefit from skilled PT services to progress toward functional mobility goals.    Recommendations for follow up therapy are one component of a multi-disciplinary discharge planning process, led by the attending physician.  Recommendations may be updated based on patient status, additional functional criteria and insurance authorization.  Follow Up Recommendations  Skilled nursing-short term rehab (<3 hours/day) Can patient physically be transported by private vehicle: No   Assistance Recommended at Discharge Frequent or constant Supervision/Assistance  Patient can return home with the following A lot of help with walking and/or  transfers;A lot of help with bathing/dressing/bathroom;Assistance with cooking/housework;Assist for transportation;Help with stairs or ramp for entrance   Equipment Recommendations  None recommended by PT    Recommendations for Other Services       Precautions / Restrictions Precautions Precautions: Fall;Sternal Precaution Booklet Issued: No Precaution Comments: pacemaker Restrictions Weight Bearing Restrictions: Yes RUE Weight Bearing: Partial weight bearing LUE Weight Bearing: Partial weight bearing     Mobility  Bed Mobility Overal bed mobility: Needs Assistance Bed Mobility: Sit to Sidelying, Rolling         Sit to sidelying: Min assist General bed mobility comments: min assist to bring LEs into bed and to reposition    Transfers Overall transfer level: Needs assistance Equipment used: Rolling walker (2 wheels) Transfers: Sit to/from Stand Sit to Stand: Min guard           General transfer comment: minguard for safety and stability, instructed pt on hands on lap as alternate to hugging pillow, pt able to demonstrate with good adherence and success    Ambulation/Gait Ambulation/Gait assistance: Min guard Gait Distance (Feet): 330 Feet Assistive device: Rolling walker (2 wheels) Gait Pattern/deviations: Step-through pattern, Decreased stride length, Trunk flexed, Wide base of support, Drifts right/left, Antalgic Gait velocity: decr     General Gait Details: Pt was able to ambulate to hallway with min assist and cues for posture with RW.   Stairs             Wheelchair Mobility    Modified Rankin (Stroke Patients Only)       Balance Overall balance assessment: Needs assistance Sitting-balance support: No upper extremity supported, Feet supported Sitting balance-Leahy Scale: Fair     Standing balance support: Bilateral upper extremity supported, During functional activity, Reliant  on assistive device for balance Standing balance-Leahy  Scale: Fair Standing balance comment: pt with preference for UE support on RW                            Cognition Arousal/Alertness: Awake/alert Behavior During Therapy: WFL for tasks assessed/performed Overall Cognitive Status: Within Functional Limits for tasks assessed                                 General Comments: Pt reports hx of PTSD, overall pleasant and followed instructions well.        Exercises      General Comments General comments (skin integrity, edema, etc.): VSS on RA      Pertinent Vitals/Pain Pain Assessment Pain Assessment: Faces Faces Pain Scale: Hurts a little bit Pain Location: incision Pain Descriptors / Indicators: Grimacing, Guarding, Discomfort Pain Intervention(s): Monitored during session, Limited activity within patient's tolerance    Home Living                          Prior Function            PT Goals (current goals can now be found in the care plan section) Acute Rehab PT Goals Patient Stated Goal: to go home PT Goal Formulation: With patient Time For Goal Achievement: 11/30/21    Frequency    Min 3X/week      PT Plan Current plan remains appropriate    Co-evaluation              AM-PAC PT "6 Clicks" Mobility   Outcome Measure  Help needed turning from your back to your side while in a flat bed without using bedrails?: A Little Help needed moving from lying on your back to sitting on the side of a flat bed without using bedrails?: A Little Help needed moving to and from a bed to a chair (including a wheelchair)?: A Little Help needed standing up from a chair using your arms (e.g., wheelchair or bedside chair)?: A Little Help needed to walk in hospital room?: A Little Help needed climbing 3-5 steps with a railing? : A Lot 6 Click Score: 17    End of Session   Activity Tolerance: Patient tolerated treatment well Patient left: with call bell/phone within reach;in bed Nurse  Communication: Mobility status PT Visit Diagnosis: Unsteadiness on feet (R26.81);Muscle weakness (generalized) (M62.81);Pain Pain - part of body:  (incision)     Time: 8592-9244 PT Time Calculation (min) (ACUTE ONLY): 19 min  Charges:  $Therapeutic Exercise: 8-22 mins                     [name] R. PTA Acute Rehabilitation Services Office: [place] 11/21/2021, 3:12 PM

## 2021-11-21 NOTE — TOC Progression Note (Signed)
Transition of Care [facility]) - Progression Note    Patient Details  Name: [name] MRN: [id number] Date of Birth: 1950/04/09  Transition of Care [facility]) CM/SW [place], [place] Phone Number: 11/21/2021, 9:48 AM  Clinical Narrative:     Informed [facility]/SNF- not medically stable for d/c today- anticipate d/c tomorrow   Expected Discharge Plan: [place] Barriers to Discharge: Barriers Resolved  Expected Discharge Plan and Services Expected Discharge Plan: [place] In-house Referral: Clinical Social Work     Living arrangements for the past 2 months: Apartment                                       Social Determinants of Health (SDOH) Interventions    Readmission Risk Interventions     No data to display

## 2021-11-22 ENCOUNTER — Telehealth: Payer: Self-pay | Admitting: Registered Nurse

## 2021-11-22 DIAGNOSIS — Z951 Presence of aortocoronary bypass graft: Secondary | ICD-10-CM | POA: Diagnosis not present

## 2021-11-22 DIAGNOSIS — E1140 Type 2 diabetes mellitus with diabetic neuropathy, unspecified: Secondary | ICD-10-CM | POA: Diagnosis not present

## 2021-11-22 DIAGNOSIS — I5022 Chronic systolic (congestive) heart failure: Secondary | ICD-10-CM | POA: Diagnosis not present

## 2021-11-22 DIAGNOSIS — R2681 Unsteadiness on feet: Secondary | ICD-10-CM | POA: Diagnosis not present

## 2021-11-22 DIAGNOSIS — R262 Difficulty in walking, not elsewhere classified: Secondary | ICD-10-CM | POA: Diagnosis not present

## 2021-11-22 DIAGNOSIS — Z48812 Encounter for surgical aftercare following surgery on the circulatory system: Secondary | ICD-10-CM | POA: Diagnosis not present

## 2021-11-22 DIAGNOSIS — I5032 Chronic diastolic (congestive) heart failure: Secondary | ICD-10-CM | POA: Diagnosis not present

## 2021-11-22 DIAGNOSIS — J189 Pneumonia, unspecified organism: Secondary | ICD-10-CM | POA: Diagnosis not present

## 2021-11-22 DIAGNOSIS — I10 Essential (primary) hypertension: Secondary | ICD-10-CM | POA: Diagnosis not present

## 2021-11-22 DIAGNOSIS — I482 Chronic atrial fibrillation, unspecified: Secondary | ICD-10-CM | POA: Diagnosis not present

## 2021-11-22 DIAGNOSIS — F4312 Post-traumatic stress disorder, chronic: Secondary | ICD-10-CM | POA: Diagnosis not present

## 2021-11-22 DIAGNOSIS — Z7401 Bed confinement status: Secondary | ICD-10-CM | POA: Diagnosis not present

## 2021-11-22 DIAGNOSIS — G4700 Insomnia, unspecified: Secondary | ICD-10-CM | POA: Diagnosis not present

## 2021-11-22 DIAGNOSIS — E1149 Type 2 diabetes mellitus with other diabetic neurological complication: Secondary | ICD-10-CM | POA: Diagnosis not present

## 2021-11-22 DIAGNOSIS — I2510 Atherosclerotic heart disease of native coronary artery without angina pectoris: Secondary | ICD-10-CM | POA: Diagnosis not present

## 2021-11-22 DIAGNOSIS — M6281 Muscle weakness (generalized): Secondary | ICD-10-CM | POA: Diagnosis not present

## 2021-11-22 DIAGNOSIS — G2581 Restless legs syndrome: Secondary | ICD-10-CM | POA: Diagnosis not present

## 2021-11-22 DIAGNOSIS — R531 Weakness: Secondary | ICD-10-CM | POA: Diagnosis not present

## 2021-11-22 LAB — CREATININE, SERUM
Creatinine, Ser: 0.92 mg/dL (ref 0.44–1.00)
GFR, Estimated: >60 mL/min

## 2021-11-22 LAB — GLUCOSE, CAPILLARY
Glucose-Capillary: 109 mg/dL — ABNORMAL HIGH (ref 70–99)
Glucose-Capillary: 199 mg/dL — ABNORMAL HIGH (ref 70–99)

## 2021-11-22 MED ORDER — MELATONIN 5 MG PO TABS: 5.0000 mg | ORAL_TABLET | Freq: Every evening | ORAL | 0 refills | Status: DC | PRN

## 2021-11-22 MED ORDER — PANTOPRAZOLE SODIUM 40 MG PO TBEC: 40.0000 mg | DELAYED_RELEASE_TABLET | Freq: Every day | ORAL | 3 refills | Status: DC

## 2021-11-22 MED ORDER — AMIODARONE HCL 200 MG PO TABS: 200.0000 mg | ORAL_TABLET | Freq: Two times a day (BID) | ORAL | Status: DC

## 2021-11-22 MED ORDER — FLUTICASONE PROPIONATE 50 MCG/ACT NA SUSP: 2.0000 | Freq: Every day | NASAL | Status: DC

## 2021-11-22 MED ORDER — METOPROLOL TARTRATE 25 MG PO TABS: 25.0000 mg | ORAL_TABLET | Freq: Two times a day (BID) | ORAL | Status: DC

## 2021-11-22 MED ORDER — HYDROCODONE-ACETAMINOPHEN 10-325 MG PO TABS: 1.0000 | ORAL_TABLET | Freq: Four times a day (QID) | ORAL | 0 refills | Status: DC | PRN

## 2021-11-22 MED ORDER — CLOPIDOGREL BISULFATE 75 MG PO TABS: 75.0000 mg | ORAL_TABLET | Freq: Every day | ORAL | Status: DC

## 2021-11-22 MED ORDER — MAGNESIUM OXIDE -MG SUPPLEMENT 400 (240 MG) MG PO TABS: 400.0000 mg | ORAL_TABLET | Freq: Two times a day (BID) | ORAL | 0 refills | Status: AC

## 2021-11-22 MED ORDER — TRIAMCINOLONE ACETONIDE 0.1 % MT PSTE: 1.0000 | PASTE | Freq: Every day | OROMUCOSAL | Status: DC | PRN

## 2021-11-22 NOTE — Progress Notes (Signed)
Patient ambulated 400 ft with front wheel walker. NO SOB . HR 90-103, mostly stayed in 90's. Plan of care continues.

## 2021-11-22 NOTE — TOC Transition Note (Signed)
Transition of Care [facility]) - CM/SW Discharge Note   Patient Details  Name: [name] MRN: [id number] Date of Birth: 30-Jul-1950  Transition of Care [facility]) CM/SW Contact:  Vinie Sill, LCSW Phone Number: 11/22/2021, 10:26 AM   Clinical Narrative:     Patient will Discharge to:  [facility]  Discharge Date: 11/22/2021 Family Notified: patient declined Transport By: Corey Harold  Per MD patient is ready for discharge. RN, patient, and facility notified of discharge. Discharge Summary sent to facility. RN given number for [phone]. Ambulance transport requested for patient.   Clinical Social Worker signing off.  Thurmond Butts, MSW, LCSW Clinical Social Worker     Final next level of care: Skilled Nursing Facility Barriers to Discharge: Barriers Resolved   Patient Goals and CMS Choice        Discharge Placement              Patient chooses bed at: [facility] Patient to be transferred to facility by: [place] Name of family member notified: patient declined Patient and family notified of of transfer: 11/22/21  Discharge Plan and Services In-house Referral: Clinical Social Work                                   Social Determinants of Health (SDOH) Interventions     Readmission Risk Interventions     No data to display

## 2021-11-22 NOTE — Discharge Instructions (Signed)
Information about your medication: Plavix (anti-platelet agent)  Generic Name (Brand): clopidogrel (Plavix), once daily medication  PURPOSE: You are taking this medication along with aspirin to lower your chance of having a heart attack, stroke, or blood clots in your heart stent. These can be fatal. Plavix and aspirin help prevent platelets from sticking together and forming a clot that can block an artery or your stent.   Common SIDE EFFECTS you may experience include: bruising or bleeding more easily, shortness of breath  Do not stop taking PLAVIX without talking to the doctor who prescribes it for you. People who are treated with a stent and stop taking Plavix too soon, have a higher risk of getting a blood clot in the stent, having a heart attack, or dying. If you stop Plavix because of bleeding, or for other reasons, your risk of a heart attack or stroke may increase.   Avoid taking NSAID agents or anti-inflammatory medications such as ibuprofen, naproxen given increased bleed risk with plavix - can use acetaminophen (Tylenol) if needed for pain.  Avoid taking over the counter stomach medications omeprazole (Prilosec) or esomeprazole (Nexium) since these do interact and make plavix less effective - ask your pharmacist or doctor for alterative agents if needed for heartburn or GERD - you are ordered for pantoprazole at discharge since doesn't interact.   Tell all of your doctors and dentists that you are taking Plavix. They should talk to the doctor who prescribed Plavix for you before you have any surgery or invasive procedure.   Contact your health care provider if you experience: severe or uncontrollable bleeding, pink/red/brown urine, vomiting blood or vomit that looks like "coffee grounds", red or black stools (looks like tar), coughing up blood or blood clots ----------------------------------------------------------------------------------------------------------------------

## 2021-11-22 NOTE — Progress Notes (Signed)
Occupational Therapy Treatment Patient Details Name: [name] [place] MRN: [id number] DOB: 1951-01-07 Today's Date: 11/22/2021   History of present illness 71 year old female admitted 8/10 to have CABG x 3.  REcent fall when leaving MD office with abrasion on knee.  PMH:  never smoker, post-operative N/V, HTN, HLD, CAD, chronic systolic CHF/cardiomyopathy (in setting of unrevascularized CAD and 23% burden PVCs 10/2021), murmur (mild MR, mild AS 08/2021 echo), palpitations (PVCs), DM2, tinnitus (left), GERD, adrenal gland cyst (stable calcified left adrenal lesions 08/23/21 CT), PTSD (by notes, following murder of her son), spinal surgery (redo L4-5 laminectomy & L4-5 PLIF 01/13/20), RLE vein stripping.   OT comments  Patient received in supine and agreeable to OT session. Patient able to get to EOB with min guard assist and HOB raised. Patient adhered to sternal precautions and wanted to perform without assistance. Patient stood with heart pillow to RW and stood at sink for grooming tasks. Patient asked to perform functional mobility in hallway with RW without seated rest break following grooming. Patient completed session in recliner. Patient is expected to discharge to SNF today.    Recommendations for follow up therapy are one component of a multi-disciplinary discharge planning process, led by the attending physician.  Recommendations may be updated based on patient status, additional functional criteria and insurance authorization.    Follow Up Recommendations  Skilled nursing-short term rehab (<3 hours/day)    Assistance Recommended at Discharge Intermittent Supervision/Assistance  Patient can return home with the following  A little help with walking and/or transfers;A little help with bathing/dressing/bathroom   Equipment Recommendations  BSC/3in1    Recommendations for Other Services      Precautions / Restrictions Precautions Precautions: Fall;Sternal Precaution Booklet Issued:  No Precaution Comments: pacemaker Restrictions Weight Bearing Restrictions: Yes RUE Weight Bearing: Partial weight bearing LUE Weight Bearing: Partial weight bearing       Mobility Bed Mobility Overal bed mobility: Needs Assistance Bed Mobility: Rolling, Supine to Sit Rolling: Supervision   Supine to sit: Min guard, HOB elevated     General bed mobility comments: min guard with trunk and patient asking for no assistance to increase her independence    Transfers Overall transfer level: Needs assistance Equipment used: Rolling walker (2 wheels) Transfers: Sit to/from Stand Sit to Stand: Min guard           General transfer comment: min guard for safsety and stability, patient uses heart pillow and rocking to stand     Balance Overall balance assessment: Needs assistance Sitting-balance support: No upper extremity supported, Feet supported Sitting balance-Leahy Scale: Fair     Standing balance support: Single extremity supported, Bilateral upper extremity supported, During functional activity Standing balance-Leahy Scale: Fair Standing balance comment: able to perform grooming tasks with one UE support                           ADL either performed or assessed with clinical judgement   ADL Overall ADL's : Needs assistance/impaired     Grooming: Wash/dry hands;Wash/dry face;Oral care;Min guard;Standing Grooming Details (indicate cue type and reason): at sink                 Toilet Transfer: Min guard;Ambulation;Rolling walker (2 wheels) Toilet Transfer Details (indicate cue type and reason): simulated to recliner           General ADL Comments: grooming performed standing at sink    Extremity/Trunk Assessment Upper Extremity Assessment RUE  Deficits / Details: pt reports numbness/tingling in 4th and 5th digit. Pt able to feel light touch but diminished compared to LUE. educated pt on tendon nerve glides with adherence to sternal  precautions. RUE Sensation: decreased light touch            Vision       Perception     Praxis      Cognition Arousal/Alertness: Awake/alert Behavior During Therapy: WFL for tasks assessed/performed Overall Cognitive Status: Within Functional Limits for tasks assessed                                 General Comments: well aware of sternal precautions        Exercises      Shoulder Instructions       General Comments      Pertinent Vitals/ Pain       Pain Assessment Pain Assessment: Faces Faces Pain Scale: Hurts a little bit Pain Location: incision, back Pain Descriptors / Indicators: Grimacing, Guarding, Discomfort Pain Intervention(s): Monitored during session, Repositioned, RN gave pain meds during session  Home Living                                          Prior Functioning/Environment              Frequency  Min 2X/week        Progress Toward Goals  OT Goals(current goals can now be found in the care plan section)  Progress towards OT goals: Progressing toward goals  Acute Rehab OT Goals Patient Stated Goal: get stronger OT Goal Formulation: With patient Time For Goal Achievement: 12/03/21 Potential to Achieve Goals: Good ADL Goals Pt Will Perform Grooming: with modified independence;standing Pt Will Perform Lower Body Dressing: with modified independence;sit to/from stand Pt Will Transfer to Toilet: with modified independence;ambulating Additional ADL Goal #1: Pt will demonstrate independence with sternal precautions during ADL completion.  Plan Discharge plan remains appropriate    Co-evaluation                 AM-PAC OT "6 Clicks" Daily Activity     Outcome Measure   Help from another person eating meals?: None Help from another person taking care of personal grooming?: A Little Help from another person toileting, which includes using toliet, bedpan, or urinal?: A Little Help from  another person bathing (including washing, rinsing, drying)?: A Little Help from another person to put on and taking off regular upper body clothing?: A Little Help from another person to put on and taking off regular lower body clothing?: A Little 6 Click Score: 19    End of Session Equipment Utilized During Treatment: Rolling walker (2 wheels)  OT Visit Diagnosis: Other abnormalities of gait and mobility (R26.89);Muscle weakness (generalized) (M62.81);Pain   Activity Tolerance Patient tolerated treatment well   Patient Left in chair;with call bell/phone within reach   Nurse Communication Mobility status        Time: 6440-3474 OT Time Calculation (min): 29 min  Charges: OT General Charges $OT Visit: 1 Visit OT Treatments $Self Care/Home Management : 8-22 mins $Therapeutic Activity: 8-22 mins  Lodema Hong, OTA Acute Rehabilitation Services  Office [phone]   Trixie Dredge 11/22/2021, 9:19 AM

## 2021-11-22 NOTE — Telephone Encounter (Signed)
Return Ms. Real call, she states she had her OHS and being transferred to Rehab. She will call this provider in two weeks with an update, she verbalizes understanding.

## 2021-11-22 NOTE — Progress Notes (Signed)
This nurse tried to go over paperwork for discharge.  Patient became upset when I advised that the doctor had given a paper prescription for pain medication for her to give to [facility].  She advised me that she is a patient of a pain clinic and cannot fill Norco because she had already had it filled.  This nurse showed patient where it was documented that she couldn't fill the prescription until 8/10, which is past today's date.  She was again upset and stated that [facility] needed to understand her care better.  She stated that she would have to call her pain clinic to have it sorted out.  This nurse responded by saying, "it sounds like you have it figured out."  She became upset again and stated "like you said, it's my problem."  This nurse packaged her discharge paperwork in the envelope, placed the envelope in her belongings bag and exited the room to avoid further confrontation.  Was unable to finish going over paperwork.

## 2021-11-22 NOTE — Telephone Encounter (Signed)
Patient states she is being discharged and they are needing to know information regarding her medication states it is urgent and to call her as soon as you can

## 2021-11-22 NOTE — Progress Notes (Addendum)
      [address]       [place] [address]             [phone]      8 Days Post-Op Procedure(s) (LRB): CORONARY ARTERY BYPASS GRAFTING (CABG) TIMES THREE USING LIMA AND CRYO VEIN. (N/A) TRANSESOPHAGEAL ECHOCARDIOGRAM (TEE) (N/A)  Subjective:  Patient without new complaints.  Hoping to be discharged today.  Objective: Vital signs in last 24 hours: Temp:  [98 F (36.7 C)-98.5 F (36.9 C)] 98.5 F (36.9 C) (08/18 0515) Pulse Rate:  [74-97] 74 (08/18 0019) Cardiac Rhythm: Normal sinus rhythm (08/17 1944) Resp:  [17-28] 17 (08/18 0521) BP: (111-129)/(48-77) 119/74 (08/18 0515) SpO2:  [91 %-98 %] 91 % (08/18 0521) Weight:  [78.2 kg] 78.2 kg (08/18 0515)  Intake/Output from previous day: 08/17 0701 - 08/18 0700 In: -  Out: 1300 [Urine:1300] Intake/Output this shift: Total I/O In: -  Out: 350 [Urine:350]  General appearance: alert, cooperative, and no distress Heart: regular rate and rhythm Lungs: clear to auscultation bilaterally Abdomen: soft, non-tender; bowel sounds normal; no masses,  no organomegaly Extremities: edema trace Wound: clean and dry  Lab Results: Recent Labs    11/20/21 0306  WBC 6.5  HGB 8.6*  HCT 25.9*  PLT 218   BMET:  Recent Labs    11/20/21 0306 11/21/21 1020 11/22/21 0555  NA 138 138  --   K 4.0 3.5  --   CL 100 99  --   CO2 29 28  --   GLUCOSE 123* 209*  --   BUN 20 16  --   CREATININE 0.94 0.99 0.92  CALCIUM 9.2 9.5  --     PT/INR: No results for input(s): "LABPROT", "INR" in the last 72 hours. ABG    Component Value Date/Time   PHART 7.353 11/14/2021 2329   HCO3 24.4 11/14/2021 2329   TCO2 26 11/14/2021 2329   ACIDBASEDEF 1.0 11/14/2021 2329   O2SAT 99 11/14/2021 2329   CBG (last 3)  Recent Labs    11/21/21 1702 11/21/21 2111 11/22/21 0646  GLUCAP 110* 129* 109*    Assessment/Plan: S/P Procedure(s) (LRB): CORONARY ARTERY BYPASS GRAFTING (CABG) TIMES THREE USING LIMA AND CRYO VEIN.  (N/A) TRANSESOPHAGEAL ECHOCARDIOGRAM (TEE) (N/A)  CV-PAF/ maintaining NSR with PVCs, Trigeminy- continue Amiodarone, Lopressor, Plavix Pulm- no acute issues, off oxygen, continue IS Renal- weight is trending down, weight is below baseline, has completed course of Lasix Deconditioning- patient lives alone needs SNF Dispo- patient stable, has been maintaining NSR since last evening, will plan to d/c to SNF today   LOS: 8 days    Ellwood Handler, PA-C 11/22/2021  patient examined and medical record reviewed,agree with above note. Dahlia Byes 11/22/2021

## 2021-11-25 DIAGNOSIS — F4312 Post-traumatic stress disorder, chronic: Secondary | ICD-10-CM | POA: Diagnosis not present

## 2021-11-25 DIAGNOSIS — G4700 Insomnia, unspecified: Secondary | ICD-10-CM | POA: Diagnosis not present

## 2021-11-25 DIAGNOSIS — E1140 Type 2 diabetes mellitus with diabetic neuropathy, unspecified: Secondary | ICD-10-CM | POA: Diagnosis not present

## 2021-11-25 DIAGNOSIS — G2581 Restless legs syndrome: Secondary | ICD-10-CM | POA: Diagnosis not present

## 2021-11-27 DIAGNOSIS — I10 Essential (primary) hypertension: Secondary | ICD-10-CM | POA: Diagnosis not present

## 2021-11-27 DIAGNOSIS — I5022 Chronic systolic (congestive) heart failure: Secondary | ICD-10-CM | POA: Diagnosis not present

## 2021-11-27 DIAGNOSIS — E1140 Type 2 diabetes mellitus with diabetic neuropathy, unspecified: Secondary | ICD-10-CM | POA: Diagnosis not present

## 2021-11-27 DIAGNOSIS — I482 Chronic atrial fibrillation, unspecified: Secondary | ICD-10-CM | POA: Diagnosis not present

## 2021-11-28 DIAGNOSIS — F3341 Major depressive disorder, recurrent, in partial remission: Secondary | ICD-10-CM | POA: Diagnosis not present

## 2021-11-28 DIAGNOSIS — M5126 Other intervertebral disc displacement, lumbar region: Secondary | ICD-10-CM | POA: Diagnosis not present

## 2021-11-28 DIAGNOSIS — F419 Anxiety disorder, unspecified: Secondary | ICD-10-CM | POA: Diagnosis not present

## 2021-11-28 DIAGNOSIS — Z48812 Encounter for surgical aftercare following surgery on the circulatory system: Secondary | ICD-10-CM | POA: Diagnosis not present

## 2021-11-28 DIAGNOSIS — I5022 Chronic systolic (congestive) heart failure: Secondary | ICD-10-CM | POA: Diagnosis not present

## 2021-11-28 DIAGNOSIS — E1142 Type 2 diabetes mellitus with diabetic polyneuropathy: Secondary | ICD-10-CM | POA: Diagnosis not present

## 2021-11-28 DIAGNOSIS — I4891 Unspecified atrial fibrillation: Secondary | ICD-10-CM | POA: Diagnosis not present

## 2021-11-28 DIAGNOSIS — M654 Radial styloid tenosynovitis [de Quervain]: Secondary | ICD-10-CM | POA: Diagnosis not present

## 2021-11-28 DIAGNOSIS — I252 Old myocardial infarction: Secondary | ICD-10-CM | POA: Diagnosis not present

## 2021-11-28 DIAGNOSIS — I25119 Atherosclerotic heart disease of native coronary artery with unspecified angina pectoris: Secondary | ICD-10-CM | POA: Diagnosis not present

## 2021-11-28 DIAGNOSIS — M19042 Primary osteoarthritis, left hand: Secondary | ICD-10-CM | POA: Diagnosis not present

## 2021-11-28 DIAGNOSIS — G894 Chronic pain syndrome: Secondary | ICD-10-CM | POA: Diagnosis not present

## 2021-11-28 DIAGNOSIS — M19012 Primary osteoarthritis, left shoulder: Secondary | ICD-10-CM | POA: Diagnosis not present

## 2021-11-28 DIAGNOSIS — G4700 Insomnia, unspecified: Secondary | ICD-10-CM | POA: Diagnosis not present

## 2021-11-28 DIAGNOSIS — I110 Hypertensive heart disease with heart failure: Secondary | ICD-10-CM | POA: Diagnosis not present

## 2021-11-28 DIAGNOSIS — M48061 Spinal stenosis, lumbar region without neurogenic claudication: Secondary | ICD-10-CM | POA: Diagnosis not present

## 2021-12-02 ENCOUNTER — Other Ambulatory Visit: Payer: Self-pay | Admitting: Cardiothoracic Surgery

## 2021-12-02 DIAGNOSIS — Z951 Presence of aortocoronary bypass graft: Secondary | ICD-10-CM

## 2021-12-06 ENCOUNTER — Encounter (HOSPITAL_COMMUNITY): Payer: Medicare Other

## 2021-12-10 ENCOUNTER — Ambulatory Visit
Admission: RE | Admit: 2021-12-10 | Discharge: 2021-12-10 | Disposition: A | Discharge status: Home / Self Care | Payer: Medicare Other | Source: Ambulatory Visit | Attending: Cardiothoracic Surgery | Admitting: Cardiothoracic Surgery

## 2021-12-10 ENCOUNTER — Ambulatory Visit (INDEPENDENT_AMBULATORY_CARE_PROVIDER_SITE_OTHER): Payer: Self-pay | Admitting: Surgical

## 2021-12-10 VITALS — BP 155/67 | HR 60 | Resp 20 | Ht 68.0 in | Wt 172.1 lb

## 2021-12-10 DIAGNOSIS — I700 Atherosclerosis of aorta: Secondary | ICD-10-CM | POA: Diagnosis not present

## 2021-12-10 DIAGNOSIS — J90 Pleural effusion, not elsewhere classified: Secondary | ICD-10-CM | POA: Diagnosis not present

## 2021-12-10 DIAGNOSIS — Z951 Presence of aortocoronary bypass graft: Secondary | ICD-10-CM

## 2021-12-10 DIAGNOSIS — J9811 Atelectasis: Secondary | ICD-10-CM | POA: Diagnosis not present

## 2021-12-10 NOTE — Patient Instructions (Signed)
Discussed activity progression and range of motion/strengthening exercises for right hand. Discussed general activity progression.

## 2021-12-10 NOTE — Progress Notes (Signed)
[address]       [place] [address]             [phone]      [facility] Medical Record #[id number] Date of Birth: 1950-12-26  Referring: Larey Dresser, MD Primary Care: Martinique, Betty G, MD Primary Cardiologist: Dorris Carnes, MD   Chief Complaint:   POST OP FOLLOW UP Operative Report    DATE OF PROCEDURE: 11/14/2021   OPERATION:   1.  Coronary artery bypass grafting x3 (left internal mammary artery to LAD, cryopreserved saphenous vein to OM1 and OM2). 2.  Unsuccessful attempt for harvest of left leg greater saphenous vein due to absence of adequate vein conduit.   PREOPERATIVE DIAGNOSES: Two-vessel coronary artery disease with exertional angina and moderate LV dysfunction.   POSTOPERATIVE DIAGNOSES: Two-vessel coronary artery disease with exertional angina and moderate LV dysfunction.   SURGEON:  Len Childs, MD   ASSISTANT:  Jadene Pierini, PA-C.   History of Present Illness:    The patient is a 71 year old female status post the above described procedure seen in the office on today's date and routine postsurgical follow-up.  Patient does complain of some right forearm and right hand numbness.  She has some sternal pain at times that she relates primarily to her breast pulling on the incision.  She is a chronic pain patient who gets her analgesics from the pain clinic.  She is not having any shortness of breath or chest pain/anginal equivalents.  She has had no fevers, chills or other significant constitutional symptoms.  She had postoperative atrial fibrillation but is not having palpitations.  She is not having any lower extremity edema.  She is somewhat frustrated with the slow course of her overall recovery.  She was discharged to a skilled nursing facility but only spent 3 days there because she was doing so well.      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[place])    Anxiety    Arthritis     Diabetes mellitus without complication (HCC)    GERD (gastroesophageal reflux disease)    Heart murmur    Hyperlipidemia    Hypertension    Neuromuscular disorder (HCC)    Palpitations    Pneumonia    PONV (postoperative nausea and vomiting)    PTSD (post-traumatic stress disorder)    Ringing of ears, left    Patient Active Problem List   Diagnosis Date Noted   Atrial fibrillation ([place]) 11/21/2021   S/P CABG x 3 11/14/2021   Chest pain at rest 11/06/2021   Statin myopathy 10/18/2021   [place] Quervain's disease (radial styloid tenosynovitis) 09/26/2021   NSTEMI (non-ST elevated myocardial infarction) ([place]) 08/22/2021   Nausea vomiting and diarrhea 08/22/2021   AKI (acute kidney injury) ([place]) 08/22/2021   CAP (community acquired pneumonia) 08/22/2021   Pneumonia 12/82/0813   Chronic systolic CHF (congestive heart failure) (HCC)    Abdominal cramping    History of atherosclerotic heart disease 07/15/2021   Coronary artery disease involving native coronary artery of native heart without angina pectoris 04/19/2021   Abnormal nuclear cardiac imaging test 03/08/2021   Diabetic peripheral neuropathy ([place]) 12/06/2020   Anxiety 01/27/2020   HNP (herniated nucleus pulposus), lumbar 01/13/2020   PTSD (post-traumatic stress disorder)    Polyneuropathy associated with underlying disease ([place]) 07/26/2019   Allergic rhinitis 07/26/2019   Atherosclerosis of aorta ([place]) 04/04/2019   Myalgia due to  statin 11/18/2017   Depression, major, recurrent, in partial remission ([place]) 10/16/2017   Insomnia 10/16/2017   Chronic pain syndrome 09/07/2017   Spinal stenosis of lumbar region 07/08/2017   DOE (dyspnea on exertion) 12/16/2016   Family history of early CAD 12/16/2016   Other chest pain 12/16/2016   Arthritis of left acromioclavicular joint 08/13/2016   Left shoulder pain 05/12/2016   Cervical spondylosis without myelopathy 05/12/2016   Rotator cuff syndrome, left 05/12/2016   Restless leg  syndrome 05/12/2016   Primary osteoarthritis, left hand 05/12/2016   MELENA 10/18/2008   GASTROPARESIS 05/30/2008   Type 2 diabetes mellitus with neurological complications ([place]) 90/30/0923   HYPERCHOLESTEROLEMIA 05/04/2008   HYPOKALEMIA 05/04/2008   GERD 05/04/2008   Constipation 05/04/2008   HEMATEMESIS 05/04/2008   Essential hypertension 05/04/2008     Social History   Tobacco Use  Smoking Status Never  Smokeless Tobacco Never  Tobacco Comments   Never smoke 11/01/21    Social History   Substance and Sexual Activity  Alcohol Use Never     Allergies  Allergen Reactions   Morphine And Related Itching    With high doses   Other Itching   Statins     Hx intolerance to multiple statins   Penicillins Rash    Has patient had a PCN reaction causing immediate rash, facial/tongue/throat swelling, SOB or lightheadedness with hypotension: Yes Has patient had a PCN reaction causing severe rash involving mucus membranes or skin necrosis: No Has patient had a PCN reaction that required hospitalization: No Has patient had a PCN reaction occurring within the last 10 years: No If all of the above answers are "NO", then may proceed with Cephalosporin use.     Current Outpatient Medications  Medication Sig Dispense Refill   albuterol (VENTOLIN HFA) 108 (90 Base) MCG/ACT inhaler INHALE 2 PUFFS INTO THE LUNGS EVERY 6 HOURS AS NEEDED FOR WHEEZING OR SHORTNESS OF BREATH 18 g 2   Alcohol Swabs (B-D SINGLE USE SWABS REGULAR) PADS Use to test blood sugar 3 times daily 100 each 3   amiodarone (PACERONE) 200 MG tablet Take 1 tablet (200 mg total) by mouth 2 (two) times daily.     aspirin EC 81 MG tablet Take 81 mg by mouth daily. Swallow whole.     Blood Glucose Calibration (ACCU-CHEK AVIVA) SOLN Use as directed 1 each 2   Blood Glucose Monitoring Suppl (ONETOUCH VERIO FLEX SYSTEM) w/Device KIT Use to test blood sugars 1-2 times daily. 1 kit 0   clonazePAM (KLONOPIN) 1 MG tablet TAKE 1  TABLET(1 MG) BY MOUTH TWICE DAILY AS NEEDED FOR ANXIETY 60 tablet 3   clopidogrel (PLAVIX) 75 MG tablet Take 1 tablet (75 mg total) by mouth daily.     Dulaglutide (TRULICITY) 1.5 RA/0.7MA SOPN Inject 1.5 mg into the skin once a week. 13 mL 3   ezetimibe (ZETIA) 10 MG tablet TAKE 1 TABLET(10 MG) BY MOUTH DAILY 90 tablet 2   fluticasone (FLONASE) 50 MCG/ACT nasal spray Place 2 sprays into both nostrils daily.     gabapentin (NEURONTIN) 600 MG tablet TAKE 1 TABLET BY MOUTH EVERY MORNING AND AT NOON AND EVERY EVENING AND AT BEDTIME 120 tablet 3   glucose blood (ONETOUCH VERIO) test strip Use to test blood sugar 1-2 times daily. 100 each 12   guaiFENesin (MUCINEX) 600 MG 12 hr tablet Take 600 mg by mouth daily.     HYDROcodone-acetaminophen (NORCO) 10-325 MG tablet Take 1 tablet by mouth every 6 (six)  hours as needed. Change in Sig: Do not fill before 11/14/2021 30 tablet 0   JARDIANCE 10 MG TABS tablet Take 1 tablet (10 mg total) by mouth daily. 90 tablet 3   Lancets (ONETOUCH ULTRASOFT) lancets Use to test blood sugar 1-2 times daily. 100 each 12   loratadine (CLARITIN) 10 MG tablet Take 10 mg by mouth daily.     melatonin 5 MG TABS Take 1 tablet (5 mg total) by mouth at bedtime as needed.  0   metoprolol tartrate (LOPRESSOR) 25 MG tablet Take 1 tablet (25 mg total) by mouth 2 (two) times daily.     multivitamin-lutein (OCUVITE-LUTEIN) CAPS capsule Take 1 capsule by mouth daily.     mupirocin ointment (BACTROBAN) 2 % Apply 1 Application topically daily as needed (skin cancer).     ondansetron (ZOFRAN-ODT) 4 MG disintegrating tablet Take 1 tablet (4 mg total) by mouth daily as needed. 20 tablet 2   pantoprazole (PROTONIX) 40 MG tablet Take 1 tablet (40 mg total) by mouth daily. 30 tablet 3   Polyethyl Glycol-Propyl Glycol (SYSTANE) 0.4-0.3 % SOLN Place 1 drop into both eyes daily as needed (Dry eye).     potassium chloride SA (KLOR-CON M) 20 MEQ tablet TAKE 1 TABLET BY MOUTH EVERY DAY 30 tablet 3    QUEtiapine (SEROQUEL) 100 MG tablet TAKE 1/2 TABLET BY MOUTH AT BEDTIME. 45 tablet 2   triamcinolone (KENALOG) 0.1 % paste Use as directed 1 Application in the mouth or throat daily as needed (mouth pain).     No current facility-administered medications for this visit.       Physical Exam: BP (!) 155/67 (BP Location: Left Arm, Patient Position: Sitting, Cuff Size: Normal)   Pulse 60   Resp 20   Ht _0  (1.727 m)   Wt 172 lb 2.1 oz (78.1 kg)   SpO2 96% Comment: RA  BMI 26.17 kg/m   General appearance: alert, cooperative, and no distress Heart: regular rate and rhythm Lungs: clear to auscultation bilaterally Abdomen: Benign Extremities: No edema Wound: Incisions well-healed without evidence of infection   Diagnostic Studies & Laboratory data:     Recent Radiology Findings:   DG Chest 2 View  Result Date: 12/10/2021 CLINICAL DATA:  Post CABG x3 EXAM: CHEST - 2 VIEW COMPARISON:  11/18/2021 FINDINGS: Normal heart size post CABG. Mediastinal contours and pulmonary vascularity normal. Atherosclerotic calcification aorta. Small pleural effusion and atelectasis at LEFT base. Remaining lungs clear. No acute infiltrate or pneumothorax. Osseous structures unremarkable. IMPRESSION: Post CABG with atelectasis and small effusion at LEFT base. Aortic Atherosclerosis (ICD10-I70.0). Electronically Signed   By: Lavonia Dana M.D.   On: 12/10/2021 13:09      Recent Lab Findings: Lab Results  Component Value Date   WBC 6.5 11/20/2021   HGB 8.6 (L) 11/20/2021   HCT 25.9 (L) 11/20/2021   PLT 218 11/20/2021   GLUCOSE 209 (H) 11/21/2021   CHOL 187 08/24/2021   TRIG 223 (H) 08/24/2021   HDL 27 (L) 08/24/2021   LDLDIRECT 140.0 01/28/2021   LDLCALC 115 (H) 08/24/2021   ALT 14 11/12/2021   AST 17 11/12/2021   NA 138 11/21/2021   K 3.5 11/21/2021   CL 99 11/21/2021   CREATININE 0.92 11/22/2021   BUN 16 11/21/2021   CO2 28 11/21/2021   TSH 0.481 11/01/2021   INR 1.4 (H) 11/14/2021   HGBA1C  6.3 (H) 08/22/2021      Assessment / Plan: The patient is doing well  overall.  I reviewed her x-ray and she has a very small effusion which does not require current treatment.  She will continue her routine activity progression and pulmonary hygiene.  I did not make any changes to her current medication regimen.  We will see the patient in 1 month to reevaluate overall postoperative condition including probable brachial plexus stretch injury..  I did instruct her on some basic range of motion and strengthening exercises that she can do in the meantime.  She has a multitude of questions that she would like to discuss with Dr. Darcey Nora so her next appointment will be made with him.      Medication Changes: No orders of the defined types were placed in this encounter.     John Giovanni, PA-C  12/10/2021 1:27 PM

## 2021-12-12 ENCOUNTER — Encounter: Payer: Medicare Other | Admitting: Registered Nurse

## 2021-12-25 ENCOUNTER — Other Ambulatory Visit: Payer: Self-pay | Admitting: *Deleted

## 2021-12-25 ENCOUNTER — Telehealth: Payer: Self-pay | Admitting: *Deleted

## 2021-12-25 NOTE — Telephone Encounter (Signed)
Patient contacted the office stating when she takes a deep breath she feels "something" in her chest. Per patient, this is new from yesterday. Patient is s/p CABG 8/10 by Dr. Prescott Gum. Patient states she was using her incentive spirometer when she felt something different. Patient states the sensation occurs below her sternum. States it does not hurt, she is not SOB, denies fevers. Patient states when she was here 9/5 her chest xray showed a small pleural effusion. Patient states she has had pneumonia in the past without any symptoms.Offered patient to receive chest xray at [facility], however patient states she lives 45 mins away and has an appt this afternoon. Patient states she can get one at [facility]. Advised patient that I was unsure whether or not patient's are able to do walk in appts at [facility]. Patient states she would feel more comfortable being evaluated via the ED. Patient states she will go to ED after an already scheduled appt this afternoon.

## 2021-12-28 ENCOUNTER — Encounter: Payer: Self-pay | Admitting: Family Medicine

## 2021-12-28 DIAGNOSIS — E876 Hypokalemia: Secondary | ICD-10-CM

## 2021-12-30 MED ORDER — POTASSIUM CHLORIDE CRYS ER 20 MEQ PO TBCR: 20.0000 meq | EXTENDED_RELEASE_TABLET | Freq: Every day | ORAL | 3 refills | Status: DC

## 2022-01-01 ENCOUNTER — Encounter: Payer: Medicare Other | Attending: Physical Medicine & Rehabilitation | Admitting: Registered Nurse

## 2022-01-01 VITALS — BP 122/76 | HR 71 | Ht 68.0 in | Wt 173.8 lb

## 2022-01-01 DIAGNOSIS — M2550 Pain in unspecified joint: Secondary | ICD-10-CM | POA: Insufficient documentation

## 2022-01-01 DIAGNOSIS — G894 Chronic pain syndrome: Secondary | ICD-10-CM | POA: Diagnosis not present

## 2022-01-01 DIAGNOSIS — Z79891 Long term (current) use of opiate analgesic: Secondary | ICD-10-CM | POA: Insufficient documentation

## 2022-01-01 DIAGNOSIS — M5412 Radiculopathy, cervical region: Secondary | ICD-10-CM

## 2022-01-01 DIAGNOSIS — E1142 Type 2 diabetes mellitus with diabetic polyneuropathy: Secondary | ICD-10-CM | POA: Insufficient documentation

## 2022-01-01 DIAGNOSIS — M47812 Spondylosis without myelopathy or radiculopathy, cervical region: Secondary | ICD-10-CM | POA: Diagnosis not present

## 2022-01-01 DIAGNOSIS — Z5181 Encounter for therapeutic drug level monitoring: Secondary | ICD-10-CM | POA: Diagnosis not present

## 2022-01-01 DIAGNOSIS — M542 Cervicalgia: Secondary | ICD-10-CM | POA: Insufficient documentation

## 2022-01-01 DIAGNOSIS — M5416 Radiculopathy, lumbar region: Secondary | ICD-10-CM | POA: Diagnosis not present

## 2022-01-01 MED ORDER — HYDROCODONE-ACETAMINOPHEN 10-325 MG PO TABS
1.0000 | ORAL_TABLET | Freq: Four times a day (QID) | ORAL | 0 refills | Status: DC | PRN
Start: 2022-01-01 — End: 2022-01-10

## 2022-01-01 MED ORDER — HYDROCODONE-ACETAMINOPHEN 10-325 MG PO TABS
1.0000 | ORAL_TABLET | Freq: Four times a day (QID) | ORAL | 0 refills | Status: DC | PRN
Start: 2022-01-01 — End: 2022-01-01

## 2022-01-01 NOTE — Progress Notes (Signed)
Subjective:    Patient ID: [name], female    DOB: 1950-04-23, 71 y.o.   MRN: [id number]  HPI: [name] is a 71 y.o. female who returns for follow up appointment for chronic pain and medication refill. She states her pain is located in her neck radiating into her left shoulder and lower back pain radiating into her left lower extremity. She rates her pain 7. Her current exercise regime is walking and performing stretching exercises.  [name] was admitted to [facility] on 11/14/2021 and discharged on 11/22/2021 for Severe CAD. She underwent  CORONARY ARTERY BYPASS GRAFTING (CABG) TIMES THREE USING LIMA AND CRYO VEIN. N/A General  TRANSESOPHAGEAL ECHOCARDIOGRAM (TEE)    By Dr Darcey Nora.   [name] equivalent is 40.00 MME.   Last Oral Swab was Performed 08/09/2021, it was consistent.     Pain Inventory Average Pain 8 Pain Right Now 7 My pain is sharp, burning, tingling, and aching  In the last 24 hours, has pain interfered with the following? General activity 8 Relation with others 8 Enjoyment of life 8 What TIME of day is your pain at its worst? morning , daytime, and night Sleep (in general) Fair  Pain is worse with: bending, sitting, inactivity, and some activites Pain improves with: rest, heat/ice, therapy/exercise, pacing activities, medication, and injections Relief from Meds: 10  Family History  Problem Relation Age of Onset   Diabetes Mother    Alzheimer's disease Mother    Lung cancer Father    Esophageal cancer Brother    Colon cancer Neg Hx    Social History   Socioeconomic History   Marital status: Widowed    Spouse name: Not on file   Number of children: 1   Years of education: Not on file   Highest education level: High school graduate  Occupational History   Occupation: retired    Comment: work part time  Tobacco Use   Smoking status: Never   Smokeless tobacco: Never   Tobacco comments:    Never smoke 11/01/21   Vaping Use   Vaping Use: Never used  Substance and Sexual Activity   Alcohol use: Never   Drug use: No   Sexual activity: Not on file  Other Topics Concern   Not on file  Social History Narrative   Not on file   Social Determinants of Health   Financial Resource Strain: Low Risk  (08/23/2021)   Overall Financial Resource Strain (CARDIA)    Difficulty of Paying Living Expenses: Not hard at all  Food Insecurity: No Food Insecurity (08/23/2021)   Hunger Vital Sign    Worried About Running Out of Food in the Last Year: Never true    [place] in the Last Year: Never true  Transportation Needs: No Transportation Needs (08/23/2021)   PRAPARE - Hydrologist (Medical): No    Lack of Transportation (Non-Medical): No  Physical Activity: Insufficiently Active (11/06/2020)   Exercise Vital Sign    Days of Exercise per Week: 2 days    Minutes of Exercise per Session: 40 min  Stress: No Stress Concern Present (11/06/2020)   [place]    Feeling of Stress : Not at all  Social Connections: Moderately Integrated (11/06/2020)   Social Connection and Isolation Panel [NHANES]    Frequency of Communication with Friends and Family: Twice a week    Frequency of Social Gatherings  with Friends and Family: Twice a week    Attends Religious Services: More than 4 times per year    Active Member of Clubs or Organizations: Yes    Attends Archivist Meetings: 1 to 4 times per year    Marital Status: Widowed   Past Surgical History:  Procedure Laterality Date   ABDOMINAL HYSTERECTOMY     partial   ACHILLES TENDON SURGERY Left    APPENDECTOMY     BACK SURGERY     BLADDER SUSPENSION     BREAST SURGERY Right 2003   milk duct removed   CARDIAC CATHETERIZATION     CHOLECYSTECTOMY     CORONARY ARTERY BYPASS GRAFT N/A 11/14/2021   Procedure: CORONARY ARTERY BYPASS GRAFTING (CABG) TIMES THREE USING  LIMA AND CRYO VEIN.;  Surgeon: Dahlia Byes, MD;  Location: [facility];  Service: Open Heart Surgery;  Laterality: N/A;   FRACTURE SURGERY     collar bone   LEFT HEART CATH AND CORONARY ANGIOGRAPHY N/A 03/08/2021   Procedure: LEFT HEART CATH AND CORONARY ANGIOGRAPHY;  Surgeon: Martinique, Peter M, MD;  Location: [facility];  Service: Cardiovascular;  Laterality: N/A;   NOSE SURGERY     x 2    OTHER SURGICAL HISTORY     Breast Duct removed   PARTIAL HYSTERECTOMY     RECTOCELE REPAIR     TEE WITHOUT CARDIOVERSION N/A 11/14/2021   Procedure: TRANSESOPHAGEAL ECHOCARDIOGRAM (TEE);  Surgeon: Dahlia Byes, MD;  Location: [facility];  Service: Open Heart Surgery;  Laterality: N/A;   TONSILLECTOMY     VASCULAR SURGERY Right    right leg   VEIN SURGERY Right    leg   Past Surgical History:  Procedure Laterality Date   ABDOMINAL HYSTERECTOMY     partial   ACHILLES TENDON SURGERY Left    APPENDECTOMY     BACK SURGERY     BLADDER SUSPENSION     BREAST SURGERY Right 2003   milk duct removed   CARDIAC CATHETERIZATION     CHOLECYSTECTOMY     CORONARY ARTERY BYPASS GRAFT N/A 11/14/2021   Procedure: CORONARY ARTERY BYPASS GRAFTING (CABG) TIMES THREE USING LIMA AND CRYO VEIN.;  Surgeon: Dahlia Byes, MD;  Location: [facility];  Service: Open Heart Surgery;  Laterality: N/A;   FRACTURE SURGERY     collar bone   LEFT HEART CATH AND CORONARY ANGIOGRAPHY N/A 03/08/2021   Procedure: LEFT HEART CATH AND CORONARY ANGIOGRAPHY;  Surgeon: Martinique, Peter M, MD;  Location: [facility];  Service: Cardiovascular;  Laterality: N/A;   NOSE SURGERY     x 2    OTHER SURGICAL HISTORY     Breast Duct removed   PARTIAL HYSTERECTOMY     RECTOCELE REPAIR     TEE WITHOUT CARDIOVERSION N/A 11/14/2021   Procedure: TRANSESOPHAGEAL ECHOCARDIOGRAM (TEE);  Surgeon: Dahlia Byes, MD;  Location: [facility];  Service: Open Heart Surgery;  Laterality: N/A;   TONSILLECTOMY     VASCULAR SURGERY Right    right leg   VEIN  SURGERY Right    leg   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[place])    Anxiety    Arthritis    Diabetes mellitus without complication (HCC)    GERD (gastroesophageal reflux disease)    Heart murmur    Hyperlipidemia    Hypertension    Neuromuscular disorder (HCC)    Palpitations    Pneumonia    PONV (postoperative  nausea and vomiting)    PTSD (post-traumatic stress disorder)    Ringing of ears, left    BP 122/76   Pulse 71   Ht '5\' 8"'$  (1.727 m)   Wt 173 lb 12.8 oz (78.8 kg)   SpO2 96%   BMI 26.43 kg/m   Opioid Risk Score:   Fall Risk Score:  `1  Depression screen PHQ 2/9     10/31/2021    2:55 PM 09/04/2021    2:25 PM 06/10/2021    2:54 PM 05/14/2021    3:21 PM 04/23/2021    2:28 PM 04/11/2021    2:42 PM 12/06/2020    3:01 PM  Depression screen PHQ 2/9  Decreased Interest 0 2 0 0 1 0 1  Down, Depressed, Hopeless 0 '1 3 2 1 1 1  '$ PHQ - 2 Score 0 '3 3 2 2 1 2  '$ Altered sleeping  '1  2 1    '$ Tired, decreased energy  '1  1 1    '$ Change in appetite  '1  2 1    '$ Feeling bad or failure about yourself   0  1 0    Trouble concentrating  0  1 0    Moving slowly or fidgety/restless  0  1 0    Suicidal thoughts  0  0 0    PHQ-9 Score  '6  10 5    '$ Difficult doing work/chores  Not difficult at all  Somewhat difficult Somewhat difficult       Review of Systems  Musculoskeletal:  Positive for back pain.       Left leg pain Left shoulder pain  All other systems reviewed and are negative.     Objective:   Physical Exam Vitals and nursing note reviewed.  Constitutional:      Appearance: Normal appearance.  Cardiovascular:     Rate and Rhythm: Normal rate and regular rhythm.     Pulses: Normal pulses.     Heart sounds: Normal heart sounds.  Pulmonary:     Effort: Pulmonary effort is normal.     Breath sounds: Normal breath sounds.  Musculoskeletal:     Cervical back: Normal range of motion and neck supple.     Comments: Normal Muscle Bulk and Muscle Testing  Reveals:  Upper Extremities:Right: Full  ROM and Muscle Strength 5/5 Left Upper Extremity: Decreased ROM 90 Degrees and Muscle Strength 4/5 Left AC Joint Tenderness  Lumbar Paraspinal Tenderness: L-3-L-5 Left Greater Trochanter Tenderness Lower Extremities: Full ROM and Muscle Strength 5/5 Arises from Chair with ease Narrow Based  Gait     Neurological:     Mental Status: She is alert and oriented to person, place, and time.  Psychiatric:        Mood and Affect: Mood normal.        Behavior: Behavior normal.         Assessment & Plan:  1.Chronic cervicalgia with documented spondylosis on MRI, per Dr. Naaman Plummer Note and Cervical Radiculitis: Continue Gabapentin,  Continue HEP as Tolerated. 01/01/2022 2. Left shoulder pain most consistent with left rotator cuff syndrome. Mild DJD.  Continue HEP as tolerated.Continue to monitor. 11/05/2021. Refilled:  Hydrocodone 10/325 mg one tablet every 6 hours as needed for pain. #120. Second script sent for the following month. We will continue the opioid monitoring program, this consists of regular clinic visits, examinations, urine drug screen, pill counts as well as use of [place] Controlled Substance Reporting system. A 12 month History  has been reviewed on the [place] on 01/01/2022. 3. Left wrist/finger pain most consistent with OA, ?post traumatic. No complaints today. 01/01/2022 4. Anxiety: Continue Klonopin, PCP prescribing. Continue to monitor. 01/01/2022 5. Restless Leg syndrome: Continue Gabapentin. 01/01/2022 7. Sacroiliac inflammation: S/P Right  SI injection on 09/20/2019, with No relief noted. Ms. Coalson seen Dr Marlou Sa in regards to her pain. Dr Saintclair Halsted. Following.  Continue to Monitor. 01/01/2022. 8. Myofascial Pain: Continue Robaxin as needed. 01/01/2022.  9. Bilateral feet with neuropathic pain: Increase Gabapentin 600 mg 4 times a day. Continue to monitor. 01/01/2022. 10.  Herniated  Nucleus Pulposus L2-3 with recurrent disc rupture: S/P  Posterior Lumbar Interbody Fusion  - Lumbar two-Lumbar three on 01/13/2020 by Dr Saintclair Halsted.  Dr Saintclair Halsted Following. 01/01/2022   F/U in 2 months

## 2022-01-03 ENCOUNTER — Other Ambulatory Visit: Payer: Self-pay | Admitting: Cardiothoracic Surgery

## 2022-01-03 ENCOUNTER — Encounter: Payer: Self-pay | Admitting: Registered Nurse

## 2022-01-03 DIAGNOSIS — Z951 Presence of aortocoronary bypass graft: Secondary | ICD-10-CM

## 2022-01-06 ENCOUNTER — Encounter: Payer: Self-pay | Admitting: Family Medicine

## 2022-01-06 ENCOUNTER — Encounter: Payer: Self-pay | Admitting: Cardiothoracic Surgery

## 2022-01-06 ENCOUNTER — Ambulatory Visit
Admission: RE | Admit: 2022-01-06 | Discharge: 2022-01-06 | Disposition: A | Discharge status: Home / Self Care | Payer: Medicare Other | Source: Ambulatory Visit | Attending: Cardiothoracic Surgery | Admitting: Cardiothoracic Surgery

## 2022-01-06 ENCOUNTER — Ambulatory Visit (INDEPENDENT_AMBULATORY_CARE_PROVIDER_SITE_OTHER): Payer: Self-pay | Admitting: Cardiothoracic Surgery

## 2022-01-06 VITALS — BP 102/62 | HR 75 | Resp 20 | Ht 68.0 in | Wt 174.1 lb

## 2022-01-06 DIAGNOSIS — Z9889 Other specified postprocedural states: Secondary | ICD-10-CM | POA: Diagnosis not present

## 2022-01-06 DIAGNOSIS — R918 Other nonspecific abnormal finding of lung field: Secondary | ICD-10-CM | POA: Diagnosis not present

## 2022-01-06 DIAGNOSIS — J984 Other disorders of lung: Secondary | ICD-10-CM | POA: Diagnosis not present

## 2022-01-06 DIAGNOSIS — Z09 Encounter for follow-up examination after completed treatment for conditions other than malignant neoplasm: Secondary | ICD-10-CM | POA: Insufficient documentation

## 2022-01-06 DIAGNOSIS — Z4889 Encounter for other specified surgical aftercare: Secondary | ICD-10-CM | POA: Insufficient documentation

## 2022-01-06 DIAGNOSIS — Z951 Presence of aortocoronary bypass graft: Secondary | ICD-10-CM

## 2022-01-06 MED ORDER — METOPROLOL TARTRATE 25 MG PO TABS: 25.0000 mg | ORAL_TABLET | Freq: Two times a day (BID) | ORAL | Status: DC

## 2022-01-06 MED ORDER — CLOPIDOGREL BISULFATE 75 MG PO TABS: 75.0000 mg | ORAL_TABLET | Freq: Every day | ORAL | Status: DC

## 2022-01-06 MED ORDER — EZETIMIBE 10 MG PO TABS
ORAL_TABLET | ORAL | 2 refills | Status: DC
Start: 2022-01-06 — End: 2022-01-31

## 2022-01-06 MED ORDER — ONDANSETRON 4 MG PO TBDP: 4.0000 mg | ORAL_TABLET | Freq: Every day | ORAL | 2 refills | Status: DC | PRN

## 2022-01-06 NOTE — Progress Notes (Signed)
HPI: The patient returns for scheduled postop visit 2 month after CABG x3.  She is doing very well her angina.  Her surgical incisions are healing well.  She has no significant postoperative pain.  She denies any symptoms of CHF.  The right-handed numbness and weakness is improving.  Her follow-up chest x-ray performed today shows resolution of the small left pleural effusion with clear lung fields and normal cardiac silhouette.  Sternal wires are intact. The patient is driving and she is ready to start cardiac rehab phase 2 at [facility] Current Outpatient Medications  Medication Sig Dispense Refill   albuterol (VENTOLIN HFA) 108 (90 Base) MCG/ACT inhaler INHALE 2 PUFFS INTO THE LUNGS EVERY 6 HOURS AS NEEDED FOR WHEEZING OR SHORTNESS OF BREATH 18 g 2   Alcohol Swabs (B-D SINGLE USE SWABS REGULAR) PADS Use to test blood sugar 3 times daily 100 each 3   amiodarone (PACERONE) 200 MG tablet Take 1 tablet (200 mg total) by mouth 2 (two) times daily.     aspirin EC 81 MG tablet Take 81 mg by mouth daily. Swallow whole.     Blood Glucose Calibration (ACCU-CHEK AVIVA) SOLN Use as directed 1 each 2   Blood Glucose Monitoring Suppl (ONETOUCH VERIO FLEX SYSTEM) w/Device KIT Use to test blood sugars 1-2 times daily. 1 kit 0   clonazePAM (KLONOPIN) 1 MG tablet TAKE 1 TABLET(1 MG) BY MOUTH TWICE DAILY AS NEEDED FOR ANXIETY 60 tablet 3   clopidogrel (PLAVIX) 75 MG tablet Take 1 tablet (75 mg total) by mouth daily.     Dulaglutide (TRULICITY) 1.5 CZ/6.6AY SOPN Inject 1.5 mg into the skin once a week. 13 mL 3   ezetimibe (ZETIA) 10 MG tablet TAKE 1 TABLET(10 MG) BY MOUTH DAILY 90 tablet 2   fluticasone (FLONASE) 50 MCG/ACT nasal spray Place 2 sprays into both nostrils daily.     gabapentin (NEURONTIN) 600 MG tablet TAKE 1 TABLET BY MOUTH EVERY MORNING AND AT NOON AND EVERY EVENING AND AT BEDTIME 120 tablet 3   glucose blood (ONETOUCH VERIO) test strip Use to test blood sugar 1-2 times daily. 100 each 12    guaiFENesin (MUCINEX) 600 MG 12 hr tablet Take 600 mg by mouth daily.     HYDROcodone-acetaminophen (NORCO) 10-325 MG tablet Take 1 tablet by mouth every 6 (six) hours as needed. Do not fill before 02/07/2022 120 tablet 0   JARDIANCE 10 MG TABS tablet Take 1 tablet (10 mg total) by mouth daily. 90 tablet 3   Lancets (ONETOUCH ULTRASOFT) lancets Use to test blood sugar 1-2 times daily. 100 each 12   loratadine (CLARITIN) 10 MG tablet Take 10 mg by mouth daily.     melatonin 5 MG TABS Take 1 tablet (5 mg total) by mouth at bedtime as needed.  0   metoprolol tartrate (LOPRESSOR) 25 MG tablet Take 1 tablet (25 mg total) by mouth 2 (two) times daily.     multivitamin-lutein (OCUVITE-LUTEIN) CAPS capsule Take 1 capsule by mouth daily.     mupirocin ointment (BACTROBAN) 2 % Apply 1 Application topically daily as needed (skin cancer).     ondansetron (ZOFRAN-ODT) 4 MG disintegrating tablet Take 1 tablet (4 mg total) by mouth daily as needed. 20 tablet 2   pantoprazole (PROTONIX) 40 MG tablet Take 1 tablet (40 mg total) by mouth daily. 30 tablet 3   Polyethyl Glycol-Propyl Glycol (SYSTANE) 0.4-0.3 % SOLN Place 1 drop into both eyes daily as needed (Dry eye).  potassium chloride SA (KLOR-CON M) 20 MEQ tablet Take 1 tablet (20 mEq total) by mouth daily. 30 tablet 3   QUEtiapine (SEROQUEL) 100 MG tablet TAKE 1/2 TABLET BY MOUTH AT BEDTIME. 45 tablet 2   triamcinolone (KENALOG) 0.1 % paste Use as directed 1 Application in the mouth or throat daily as needed (mouth pain).     No current facility-administered medications for this visit.     Physical Exam: Blood pressure 102/62, pulse 75, resp. rate 20, height 5' 8"  (1.727 m), weight 174 lb 1.6 oz (79 kg), SpO2 94 %.   Alert and comfortable Lungs are clear Heart rate regular without murmur or gallop Surgical incisions well-healed No peripheral edema  Diagnostic Tests: Chest x-ray personally reviewed showing resolution of previous small pleural  effusion  Impression: Patient doing extremely well recovering from multivessel CABG.  Since she had CryoVein used because of no usable native vein she understands she must remain on both aspirin and Plavix  long-term. She has had no further atrial fibrillation and we will stop the amiodarone. She will continue the Zetia, metoprolol, Lasix and potassium. Plan: Return for final postop visit in 2 months. She will not lift more than 20 pounds. She will start cardiac rehab at [place], MD [facility] [phone]

## 2022-01-07 ENCOUNTER — Ambulatory Visit (INDEPENDENT_AMBULATORY_CARE_PROVIDER_SITE_OTHER): Payer: Medicare Other

## 2022-01-07 VITALS — Ht 68.0 in | Wt 174.0 lb

## 2022-01-07 DIAGNOSIS — Z0000 Encounter for general adult medical examination without abnormal findings: Secondary | ICD-10-CM

## 2022-01-07 NOTE — Patient Instructions (Addendum)
[name] , Thank you for taking time to come for your Medicare Wellness Visit. I appreciate your ongoing commitment to your health goals. Please review the following plan we discussed and let me know if I can assist you in the future.   These are the goals we discussed:  Goals       Pharmacy Care Plan      CARE PLAN ENTRY  Current Barriers:  Chronic Disease Management support, education, and care coordination needs related to Hypertension, Hyperlipidemia, Diabetes, Coronary Artery Disease, Gastroesophageal Reflux Disease, and Allergic rhinitis, Insomnia, polyneuropathy/ restless legs syndrome, chronic pain syndrome    Hypertension Pharmacist Clinical Goal(s): Over the next 180 days, patient will work with PharmD and providers to maintain BP goal < 13080 Current regimen:  Hydrochlorothiazide '25mg'$ , 1 tablet once daily  Interventions: We discussed: DASH diet:  following a diet emphasizing fruits and vegetables and low-fat dairy products along with whole grains, fish, poultry, and nuts. Reducing red meats and sugars.  Patient self care activities - Over the next 180 days, patient will: Ensure daily salt intake < 1500 mg/day Will work on lifestyle modifications (diet and exercise).   Hyperlipidemia Pharmacist Clinical Goal(s): Over the next 180 days, patient will work with PharmD and providers to achieve triglyceride: <150 Current regimen:  Fenofibrate '145mg'$ , 1 tablet once daily  Interventions: How to reduce cholesterol through diet/weight management and physical activity.    We discussed how a diet high in plant sterols (fruits/vegetables/nuts/whole grains/legumes) may reduce your cholesterol.  Encouraged increasing fiber to a daily intake of 10-25g/day  Patient self care activities - Over the next 180 days, patient will: Work on improving lifestyle modifications   Diabetes Pharmacist Clinical Goal(s): Over the next 180 days, patient will work with PharmD and providers to maintain  A1c goal < 6.9 Current regimen:  Trulicity (dulaglutide), inject 1.'5mg'$  into skin once a week  Interventions: Discussed importance of diabetes preventative health exams. Patient self care activities - Over the next 180 days, patient will: Schedule eye doctor appointment.   GERD/ acid reflux  Pharmacist Clinical Goal(s) Over the next 180 days, patient will work with PharmD and providers to optimize therapy.  Current regimen:  Omeprazole '40mg'$ , 1 capsule once daily  Interventions: We discussed:  non-pharmacological interventions for acid reflux. Take measures to prevent acid reflux, such as avoiding spicy foods, avoiding caffeine, avoid laying down a few hours after eating, and raising the head of the bed. Patient self care activities - Over the next 180 days, patient will: Continue to work on non-pharmacological interventions to improve symptoms.   Insomnia Pharmacist Clinical Goal(s) Over the next 30 days, patient will work with PharmD and providers to optimize therapy. Current regimen:  Doxepin '10mg'$ , 1 capsule at bedtime  Melatonin '3mg'$ , may take up '10mg'$ .  Interventions: We discussed: non-pharmacological interventions for insomnia. (Avoid napping, limit exposure to technology near bedtime, etc) Patient self care activities- Over the next 180 days, patient will: Patient will work on non-pharmacological interventions.   Anxiety/depression Pharmacist Clinical Goal(s) Over the next 180 days, patient continue use as needed  Current regimen:  Clonazepam '1mg'$ , 1 tablet twice daily as needed for anxiety Patient self care activities Patient will continue use as directed.   Polyneuropathy/ restless leg syndrome  Pharmacist Clinical Goal(s) Over the next 30 days, patient will work with PharmD and providers to optimize therapy. Current regimen:  Gabapentin '400mg'$ , 1 capsule three times daily  Interventions: Will look at insurance formulary coverage and will provide other  options to  consider  Patient self care activities Patient will continue to follow up and discuss other options with Dr. Martinique.  Chronic pain syndrome Pharmacist Clinical Goal(s) Over the next 180 days, patient will continue to take as needed for pain  Current regimen:  Hydrocodone/ APAP 5/'325mg'$ , 1 tablet every six hours as needed for moderate pain  Patient self care activities Patient will continue as directed.   Allergic rhinitis Pharmacist Clinical Goal(s) Over the next 180 days, patient will continue current regimen Current regimen:  Olopatadine (Pazeo) 0.7% solution, 1 drop to eye daily Systane regular eye drops Ipratropium (Atrovent) 0.06% nasal spray, 2 sprays into both nostrils four times daily  Patient self care activities Patient will continue current regimen.  Medication management Pharmacist Clinical Goal(s): Over the next 180 days, patient will work with PharmD and providers to maintain optimal medication adherence Current pharmacy: [facility] Interventions Comprehensive medication review performed. Continue current medication management strategy Patient self care activities - Over the next 180 days, patient will: Take medications as prescribed Report any questions or concerns to PharmD and/or provider(s)  Initial goal documentation       To be active (pt-stated)      Stay happy, and healthy.           This is a list of the screening recommended for you and due dates:  Health Maintenance  Topic Date Due   Eye exam for diabetics  11/29/2020   Yearly kidney health urinalysis for diabetes  12/26/2020   COVID-19 Vaccine (2 - Moderna risk series) 01/23/2022*   Zoster (Shingles) Vaccine (2 of 2) 04/11/2022*   Flu Shot  07/06/2022*   Mammogram  01/08/2023*   Colon Cancer Screening  02/18/2022   Hemoglobin A1C  02/22/2022   Complete foot exam   04/23/2022   Yearly kidney function blood test for diabetes  11/23/2022   Pneumonia Vaccine  Completed   DEXA scan (bone density  measurement)  Completed   Hepatitis C Screening: USPSTF Recommendation to screen - Ages 55-79 yo.  Completed   HPV Vaccine  Aged Out   Tetanus Vaccine  Discontinued  *Topic was postponed. The date shown is not the original due date.    Advanced directives: Please bring a copy of your health care power of attorney and living will to the office to be added to your chart at your convenience.   Conditions/risks identified: None  Next appointment: Follow up in one year for your annual wellness visit     Preventive Care 65 Years and Older, Female Preventive care refers to lifestyle choices and visits with your health care provider that can promote health and wellness. What does preventive care include? A yearly physical exam. This is also called an annual well check. Dental exams once or twice a year. Routine eye exams. Ask your health care provider how often you should have your eyes checked. Personal lifestyle choices, including: Daily care of your teeth and gums. Regular physical activity. Eating a healthy diet. Avoiding tobacco and drug use. Limiting alcohol use. Practicing safe sex. Taking low-dose aspirin every day. Taking vitamin and mineral supplements as recommended by your health care provider. What happens during an annual well check? The services and screenings done by your health care provider during your annual well check will depend on your age, overall health, lifestyle risk factors, and family history of disease. Counseling  Your health care provider may ask you questions about your: Alcohol use. Tobacco use. Drug use. Emotional well-being. Home  and relationship well-being. Sexual activity. Eating habits. History of falls. Memory and ability to understand (cognition). Work and work Statistician. Reproductive health. Screening  You may have the following tests or measurements: Height, weight, and BMI. Blood pressure. Lipid and cholesterol levels. These may be  checked every 5 years, or more frequently if you are over 84 years old. Skin check. Lung cancer screening. You may have this screening every year starting at age 65 if you have a 30-pack-year history of smoking and currently smoke or have quit within the past 15 years. Fecal occult blood test (FOBT) of the stool. You may have this test every year starting at age 57. Flexible sigmoidoscopy or colonoscopy. You may have a sigmoidoscopy every 5 years or a colonoscopy every 10 years starting at age 75. Hepatitis C blood test. Hepatitis B blood test. Sexually transmitted disease (STD) testing. Diabetes screening. This is done by checking your blood sugar (glucose) after you have not eaten for a while (fasting). You may have this done every 1-3 years. Bone density scan. This is done to screen for osteoporosis. You may have this done starting at age 28. Mammogram. This may be done every 1-2 years. Talk to your health care provider about how often you should have regular mammograms. Talk with your health care provider about your test results, treatment options, and if necessary, the need for more tests. Vaccines  Your health care provider may recommend certain vaccines, such as: Influenza vaccine. This is recommended every year. Tetanus, diphtheria, and acellular pertussis (Tdap, Td) vaccine. You may need a Td booster every 10 years. Zoster vaccine. You may need this after age 47. Pneumococcal 13-valent conjugate (PCV13) vaccine. One dose is recommended after age 32. Pneumococcal polysaccharide (PPSV23) vaccine. One dose is recommended after age 45. Talk to your health care provider about which screenings and vaccines you need and how often you need them. This information is not intended to replace advice given to you by your health care provider. Make sure you discuss any questions you have with your health care provider. Document Released: 04/20/2015 Document Revised: 12/12/2015 Document Reviewed:  01/23/2015 Elsevier Interactive Patient Education  2017 [place] Prevention in the Home Falls can cause injuries. They can happen to people of all ages. There are many things you can do to make your home safe and to help prevent falls. What can I do on the outside of my home? Regularly fix the edges of walkways and driveways and fix any cracks. Remove anything that might make you trip as you walk through a door, such as a raised step or threshold. Trim any bushes or trees on the path to your home. Use bright outdoor lighting. Clear any walking paths of anything that might make someone trip, such as rocks or tools. Regularly check to see if handrails are loose or broken. Make sure that both sides of any steps have handrails. Any raised decks and porches should have guardrails on the edges. Have any leaves, snow, or ice cleared regularly. Use sand or salt on walking paths during winter. Clean up any spills in your garage right away. This includes oil or grease spills. What can I do in the bathroom? Use night lights. Install grab bars by the toilet and in the tub and shower. Do not use towel bars as grab bars. Use non-skid mats or decals in the tub or shower. If you need to sit down in the shower, use a plastic, non-slip stool. Keep the floor  dry. Clean up any water that spills on the floor as soon as it happens. Remove soap buildup in the tub or shower regularly. Attach bath mats securely with double-sided non-slip rug tape. Do not have throw rugs and other things on the floor that can make you trip. What can I do in the bedroom? Use night lights. Make sure that you have a light by your bed that is easy to reach. Do not use any sheets or blankets that are too big for your bed. They should not hang down onto the floor. Have a firm chair that has side arms. You can use this for support while you get dressed. Do not have throw rugs and other things on the floor that can make you  trip. What can I do in the kitchen? Clean up any spills right away. Avoid walking on wet floors. Keep items that you use a lot in easy-to-reach places. If you need to reach something above you, use a strong step stool that has a grab bar. Keep electrical cords out of the way. Do not use floor polish or wax that makes floors slippery. If you must use wax, use non-skid floor wax. Do not have throw rugs and other things on the floor that can make you trip. What can I do with my stairs? Do not leave any items on the stairs. Make sure that there are handrails on both sides of the stairs and use them. Fix handrails that are broken or loose. Make sure that handrails are as long as the stairways. Check any carpeting to make sure that it is firmly attached to the stairs. Fix any carpet that is loose or worn. Avoid having throw rugs at the top or bottom of the stairs. If you do have throw rugs, attach them to the floor with carpet tape. Make sure that you have a light switch at the top of the stairs and the bottom of the stairs. If you do not have them, ask someone to add them for you. What else can I do to help prevent falls? Wear shoes that: Do not have high heels. Have rubber bottoms. Are comfortable and fit you well. Are closed at the toe. Do not wear sandals. If you use a stepladder: Make sure that it is fully opened. Do not climb a closed stepladder. Make sure that both sides of the stepladder are locked into place. Ask someone to hold it for you, if possible. Clearly mark and make sure that you can see: Any grab bars or handrails. First and last steps. Where the edge of each step is. Use tools that help you move around (mobility aids) if they are needed. These include: Canes. Walkers. Scooters. Crutches. Turn on the lights when you go into a dark area. Replace any light bulbs as soon as they burn out. Set up your furniture so you have a clear path. Avoid moving your furniture  around. If any of your floors are uneven, fix them. If there are any pets around you, be aware of where they are. Review your medicines with your doctor. Some medicines can make you feel dizzy. This can increase your chance of falling. Ask your doctor what other things that you can do to help prevent falls. This information is not intended to replace advice given to you by your health care provider. Make sure you discuss any questions you have with your health care provider. Document Released: 01/18/2009 Document Revised: 08/30/2015 Document Reviewed: 04/28/2014 Elsevier Interactive Patient  Education  2017 [employer].

## 2022-01-07 NOTE — Progress Notes (Signed)
Subjective:   [name] is a 71 y.o. female who presents for Medicare Annual (Subsequent) preventive examination.  Review of Systems    Virtual Visit via Telephone Note  I connected with  [name] on 01/07/22 at  2:30 PM EDT by telephone and verified that I am speaking with the correct person using two identifiers.  Location: Patient: Home Provider: Office Persons participating in the virtual visit: patient/Nurse Health Advisor   I discussed the limitations, risks, security and privacy concerns of performing an evaluation and management service by telephone and the availability of in person appointments. The patient expressed understanding and agreed to proceed.  Interactive audio and video telecommunications were attempted between this nurse and patient, however failed, due to patient having technical difficulties OR patient did not have access to video capability.  We continued and completed visit with audio only.  Some vital signs may be absent or patient reported.   Jeanette Peaches, LPN  Cardiac Risk Factors include: advanced age (>37mn, >>22women);hypertension;diabetes mellitus     Objective:    Today's Vitals   01/07/22 1422  Weight: 174 lb (78.9 kg)  Height: 5' 8"  (1.727 m)   Body mass index is 26.46 kg/m.     01/07/2022    2:34 PM 11/14/2021    3:00 PM 11/14/2021    6:24 AM 11/12/2021    3:54 PM 08/22/2021    7:35 AM 08/22/2021    1:41 AM 03/08/2021    7:52 AM  Advanced Directives  Does Patient Have a Medical Advance Directive? Yes Yes Yes Yes No No No  Type of Advance Directive Living will Living will  Living will     Does patient want to make changes to medical advance directive?  No - Patient declined No - Patient declined      Copy of [place] Chart? No - copy requested        Would patient like information on creating a medical advance directive?     No - Patient declined  No - Patient declined    Current  Medications (verified) Outpatient Encounter Medications as of 01/07/2022  Medication Sig   albuterol (VENTOLIN HFA) 108 (90 Base) MCG/ACT inhaler INHALE 2 PUFFS INTO THE LUNGS EVERY 6 HOURS AS NEEDED FOR WHEEZING OR SHORTNESS OF BREATH   Alcohol Swabs (B-D SINGLE USE SWABS REGULAR) PADS Use to test blood sugar 3 times daily   amiodarone (PACERONE) 200 MG tablet Take 1 tablet (200 mg total) by mouth 2 (two) times daily.   aspirin EC 81 MG tablet Take 81 mg by mouth daily. Swallow whole.   Blood Glucose Calibration (ACCU-CHEK AVIVA) SOLN Use as directed   Blood Glucose Monitoring Suppl (ONETOUCH VERIO FLEX SYSTEM) w/Device KIT Use to test blood sugars 1-2 times daily.   clonazePAM (KLONOPIN) 1 MG tablet TAKE 1 TABLET(1 MG) BY MOUTH TWICE DAILY AS NEEDED FOR ANXIETY   clopidogrel (PLAVIX) 75 MG tablet Take 1 tablet (75 mg total) by mouth daily.   Dulaglutide (TRULICITY) 1.5 MFU/9.3ATSOPN Inject 1.5 mg into the skin once a week.   ezetimibe (ZETIA) 10 MG tablet TAKE 1 TABLET(10 MG) BY MOUTH DAILY   fluticasone (FLONASE) 50 MCG/ACT nasal spray Place 2 sprays into both nostrils daily.   gabapentin (NEURONTIN) 600 MG tablet TAKE 1 TABLET BY MOUTH EVERY MORNING AND AT NOON AND EVERY EVENING AND AT BEDTIME   glucose blood (ONETOUCH VERIO) test strip Use to test blood sugar  1-2 times daily.   guaiFENesin (MUCINEX) 600 MG 12 hr tablet Take 600 mg by mouth daily.   HYDROcodone-acetaminophen (NORCO) 10-325 MG tablet Take 1 tablet by mouth every 6 (six) hours as needed. Do not fill before 02/07/2022   JARDIANCE 10 MG TABS tablet Take 1 tablet (10 mg total) by mouth daily.   Lancets (ONETOUCH ULTRASOFT) lancets Use to test blood sugar 1-2 times daily.   loratadine (CLARITIN) 10 MG tablet Take 10 mg by mouth daily.   melatonin 5 MG TABS Take 1 tablet (5 mg total) by mouth at bedtime as needed.   metoprolol tartrate (LOPRESSOR) 25 MG tablet Take 1 tablet (25 mg total) by mouth 2 (two) times daily.    multivitamin-lutein (OCUVITE-LUTEIN) CAPS capsule Take 1 capsule by mouth daily.   mupirocin ointment (BACTROBAN) 2 % Apply 1 Application topically daily as needed (skin cancer).   ondansetron (ZOFRAN-ODT) 4 MG disintegrating tablet Take 1 tablet (4 mg total) by mouth daily as needed.   pantoprazole (PROTONIX) 40 MG tablet Take 1 tablet (40 mg total) by mouth daily.   Polyethyl Glycol-Propyl Glycol (SYSTANE) 0.4-0.3 % SOLN Place 1 drop into both eyes daily as needed (Dry eye).   potassium chloride SA (KLOR-CON M) 20 MEQ tablet Take 1 tablet (20 mEq total) by mouth daily.   QUEtiapine (SEROQUEL) 100 MG tablet TAKE 1/2 TABLET BY MOUTH AT BEDTIME.   triamcinolone (KENALOG) 0.1 % paste Use as directed 1 Application in the mouth or throat daily as needed (mouth pain).   No facility-administered encounter medications on file as of 01/07/2022.    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[place])    Anxiety    Arthritis    Diabetes mellitus without complication (HCC)    GERD (gastroesophageal reflux disease)    Heart murmur    Hyperlipidemia    Hypertension    Neuromuscular disorder (HCC)    Palpitations    Pneumonia    PONV (postoperative nausea and vomiting)    PTSD (post-traumatic stress disorder)    Ringing of ears, left    Past Surgical History:  Procedure Laterality Date   ABDOMINAL HYSTERECTOMY     partial   ACHILLES TENDON SURGERY Left    APPENDECTOMY     BACK SURGERY     BLADDER SUSPENSION     BREAST SURGERY Right 2003   milk duct removed   CARDIAC CATHETERIZATION     CHOLECYSTECTOMY     CORONARY ARTERY BYPASS GRAFT N/A 11/14/2021   Procedure: CORONARY ARTERY BYPASS GRAFTING (CABG) TIMES THREE USING LIMA AND CRYO VEIN.;  Surgeon: Dahlia Byes, MD;  Location: [facility];  Service: Open Heart Surgery;  Laterality: N/A;   FRACTURE SURGERY     collar bone   LEFT HEART CATH  AND CORONARY ANGIOGRAPHY N/A 03/08/2021   Procedure: LEFT HEART CATH AND CORONARY ANGIOGRAPHY;  Surgeon: Martinique, Peter M, MD;  Location: [facility];  Service: Cardiovascular;  Laterality: N/A;   NOSE SURGERY     x 2    OTHER SURGICAL HISTORY     Breast Duct removed   PARTIAL HYSTERECTOMY     RECTOCELE REPAIR     TEE WITHOUT CARDIOVERSION N/A 11/14/2021   Procedure: TRANSESOPHAGEAL ECHOCARDIOGRAM (TEE);  Surgeon: Dahlia Byes, MD;  Location: [facility];  Service: Open Heart Surgery;  Laterality: N/A;   TONSILLECTOMY     VASCULAR SURGERY Right  right leg   VEIN SURGERY Right    leg   Family History  Problem Relation Age of Onset   Diabetes Mother    Alzheimer's disease Mother    Lung cancer Father    Esophageal cancer Brother    Colon cancer Neg Hx    Social History   Socioeconomic History   Marital status: Widowed    Spouse name: Not on file   Number of children: 1   Years of education: Not on file   Highest education level: High school graduate  Occupational History   Occupation: retired    Comment: work part time  Tobacco Use   Smoking status: Never   Smokeless tobacco: Never   Tobacco comments:    Never smoke 11/01/21  Vaping Use   Vaping Use: Never used  Substance and Sexual Activity   Alcohol use: Never   Drug use: No   Sexual activity: Not on file  Other Topics Concern   Not on file  Social History Narrative   Not on file   Social Determinants of Health   Financial Resource Strain: Low Risk  (01/07/2022)   Overall Financial Resource Strain (CARDIA)    Difficulty of Paying Living Expenses: Not hard at all  Food Insecurity: No Food Insecurity (01/07/2022)   Hunger Vital Sign    Worried About Running Out of Food in the Last Year: Never true    [place] in the Last Year: Never true  Transportation Needs: No Transportation Needs (01/07/2022)   PRAPARE - Hydrologist (Medical): No    Lack of Transportation  (Non-Medical): No  Physical Activity: Insufficiently Active (01/07/2022)   Exercise Vital Sign    Days of Exercise per Week: 7 days    Minutes of Exercise per Session: 20 min  Stress: No Stress Concern Present (01/07/2022)   [place]    Feeling of Stress : Not at all  Social Connections: [place] (01/07/2022)   Social Connection and Isolation Panel [NHANES]    Frequency of Communication with Friends and Family: More than three times a week    Frequency of Social Gatherings with Friends and Family: More than three times a week    Attends Religious Services: More than 4 times per year    Active Member of [employer] or Organizations: Yes    Attends Music therapist: More than 4 times per year    Marital Status: Married    Tobacco Counseling Counseling given: Not Answered Tobacco comments: Never smoke 11/01/21   Clinical Intake: Nutrition Risk Assessment:  Has the patient had any N/V/D within the last 2 months?  No  Does the patient have any non-healing wounds?  No  Has the patient had any unintentional weight loss or weight gain?  No   Diabetes:  Is the patient diabetic?  Yes  If diabetic, was a CBG obtained today?  Yes CBG 133 Taken by paient Did the patient bring in their glucometer from home?  No  How often do you monitor your CBG's? 2 X Daily.   Financial Strains and Diabetes Management:  Are you having any financial strains with the device, your supplies or your medication? No .  Does the patient want to be seen by Chronic Care Management for management of their diabetes?  No  Would the patient like to be referred to a Nutritionist or for Diabetic Management?  No   Diabetic  Exams:  Diabetic Eye Exam: Completed No. Overdue for diabetic eye exam. Pt has been advised about the importance in completing this exam. A referral has been placed today. Message sent to referral coordinator for  scheduling purposes. Advised pt to expect a call from office referred to regarding appt.  Diabetic Foot Exam: Completed No. Pt has been advised about the importance in completing this exam. Pt is scheduled for diabetic foot exam on Followed by PCP.   Pre-visit preparation completed: No  Pain : No/denies pain     BMI - recorded: 26.46 Nutritional Status: BMI 25 -29 Overweight Nutritional Risks: None Diabetes: Yes CBG done?: Yes CBG resulted in Enter/ Edit results?: Yes (CBG  133 Taken by patient) Did pt. bring in CBG monitor from home?: No  How often do you need to have someone help you when you read instructions, pamphlets, or other written materials from your doctor or pharmacy?: 1 - Never  Diabetic?  Yes  Interpreter Needed?: No  Information entered by :: Rolene Arbour LPN   Activities of Daily Living    01/07/2022    2:31 PM 11/14/2021    3:00 PM  In your present state of health, do you have any difficulty performing the following activities:  Hearing? 0 0  Vision? 0 0  Difficulty concentrating or making decisions? 0 0  Walking or climbing stairs? 0 1  Dressing or bathing? 0 0  Doing errands, shopping? 0 0  Preparing Food and eating ? N   Using the Toilet? N   In the past six months, have you accidently leaked urine? N   Do you have problems with loss of bowel control? N   Managing your Medications? N   Managing your Finances? N   Housekeeping or managing your Housekeeping? N     Patient Care Team: Martinique, Betty G, MD as PCP - General (Family Medicine) Fay Records, MD as PCP - Cardiology (Cardiology) Earnie Larsson, [facility] as Pharmacist (Pharmacist)  Indicate any recent Medical Services you may have received from other than [facility] providers in the past year (date may be approximate).     Assessment:   This is a routine wellness examination for [name].  Hearing/Vision screen Hearing Screening - Comments:: Denies hearing difficulties   Vision Screening -  Comments:: Wears rx glasses - up to date with routine eye exams with  Dr Rosana Hoes  Dietary issues and exercise activities discussed: Exercise limited by: None identified   Goals Addressed               This Visit's Progress     To be active (pt-stated)        Stay happy, and healthy.          Depression Screen    01/07/2022    2:29 PM 10/31/2021    2:55 PM 09/04/2021    2:25 PM 06/10/2021    2:54 PM 05/14/2021    3:21 PM 04/23/2021    2:28 PM 04/11/2021    2:42 PM  PHQ 2/9 Scores  PHQ - 2 Score 0 0 3 3 2 2 1   PHQ- 9 Score   6  10 5      Fall Risk    01/07/2022    2:31 PM 01/01/2022    2:36 PM 10/31/2021    2:55 PM 09/04/2021    2:25 PM 06/10/2021    2:54 PM  Fall Risk   Falls in the past year? 0 0 0 0 0  Number falls  in past yr: 0  0 0 0  Injury with Fall? 0   0   Risk for fall due to : No Fall Risks   No Fall Risks   Follow up Falls prevention discussed   Falls evaluation completed     FALL RISK PREVENTION PERTAINING TO THE HOME:  Any stairs in or around the home? No  If so, are there any without handrails? No  Home free of loose throw rugs in walkways, pet beds, electrical cords, etc? Yes  Adequate lighting in your home to reduce risk of falls? Yes   ASSISTIVE DEVICES UTILIZED TO PREVENT FALLS:  Life alert? No  Use of a cane, walker or w/c? No  Grab bars in the bathroom? Yes  Shower chair or bench in shower? Yes  Elevated toilet seat or a handicapped toilet? Yes   TIMED UP AND GO:  Was the test performed? No . Audio Visit   Cognitive Function:        01/07/2022    2:34 PM  6CIT Screen  What Year? 0 points  What month? 0 points  What time? 0 points  Count back from 20 0 points  Months in reverse 0 points  Repeat phrase 0 points  Total Score 0 points    Immunizations Immunization History  Administered Date(s) Administered   Influenza Split 01/28/2012, 04/07/2012   Influenza, Quadrivalent, Recombinant, Inj, Pf 12/18/2018   Influenza,inj,Quad PF,6+  Mos 12/29/2016   Influenza-Unspecified 02/03/2013, 03/06/2020, 12/19/2020   MODERNA COVID-19 SARS-COV-2 PEDS BIVALENT BOOSTER 6Y-11Y 07/13/2019   Pneumococcal Conjugate-13 01/13/2017   Pneumococcal Polysaccharide-23 11/17/2018   Zoster Recombinat (Shingrix) 12/10/2018   Zoster, Live 03/15/2015, 03/24/2015      Flu Vaccine status: Up to date  Pneumococcal vaccine status: Up to date  Covid-19 vaccine status: Completed vaccines  Qualifies for Shingles Vaccine? Yes   Zostavax completed Yes   Shingrix Completed?: No.    Education has been provided regarding the importance of this vaccine. Patient has been advised to call insurance company to determine out of pocket expense if they have not yet received this vaccine. Advised may also receive vaccine at local pharmacy or Health Dept. Verbalized acceptance and understanding.  Screening Tests Health Maintenance  Topic Date Due   OPHTHALMOLOGY EXAM  11/29/2020   Diabetic kidney evaluation - Urine ACR  12/26/2020   COVID-19 Vaccine (2 - Moderna risk series) 01/23/2022 (Originally 08/10/2019)   Zoster Vaccines- Shingrix (2 of 2) 04/11/2022 (Originally 02/04/2019)   INFLUENZA VACCINE  07/06/2022 (Originally 11/05/2021)   MAMMOGRAM  01/08/2023 (Originally 05/18/2020)   COLONOSCOPY (Pts 45-37yr Insurance coverage will need to be confirmed)  02/18/2022   HEMOGLOBIN A1C  02/22/2022   FOOT EXAM  04/23/2022   Diabetic kidney evaluation - GFR measurement  11/23/2022   Pneumonia Vaccine 70 Years old  Completed   DEXA SCAN  Completed   Hepatitis C Screening  Completed   HPV VACCINES  Aged Out   TETANUS/TDAP  Discontinued    Health Maintenance  Health Maintenance Due  Topic Date Due   OPHTHALMOLOGY EXAM  11/29/2020   Diabetic kidney evaluation - Urine ACR  12/26/2020    Colorectal cancer screening: Type of screening: Colonoscopy. Completed 02/18/17. Repeat every 5 years  Mammogram status: Ordered Patient deferred. Pt provided with contact  info and advised to call to schedule appt.   Bone Density status: Completed 05/13/19. Results reflect: Bone density results: OSTEOPOROSIS. Repeat every   years.  Lung Cancer Screening: (Low Dose CT Chest  recommended if Age 36-80 years, 63 pack-year currently smoking OR have quit w/in 15years.) does not qualify.     Additional Screening:  Hepatitis C Screening: does qualify; Completed 09/11/15  Vision Screening: Recommended annual ophthalmology exams for early detection of glaucoma and other disorders of the eye. Is the patient up to date with their annual eye exam?  Yes  Who is the provider or what is the name of the office in which the patient attends annual eye exams? Dr Rosana Hoes If pt is not established with a provider, would they like to be referred to a provider to establish care? No .   Dental Screening: Recommended annual dental exams for proper oral hygiene  Community Resource Referral / Chronic Care Management:  CRR required this visit?  No   CCM required this visit?  No      Plan:     I have personally reviewed and noted the following in the patient's chart:   Medical and social history Use of alcohol, tobacco or illicit drugs  Current medications and supplements including opioid prescriptions. Patient is currently taking opioid prescriptions. Information provided to patient regarding non-opioid alternatives. Patient advised to discuss non-opioid treatment plan with their provider. Functional ability and status Nutritional status Physical activity Advanced directives List of other physicians Hospitalizations, surgeries, and ER visits in previous 12 months Vitals Screenings to include cognitive, depression, and falls Referrals and appointments  In addition, I have reviewed and discussed with patient certain preventive protocols, quality metrics, and best practice recommendations. A written personalized care plan for preventive services as well as general preventive  health recommendations were provided to patient.     Jeanette Peaches, LPN   [id number]   Nurse Notes: None

## 2022-01-08 ENCOUNTER — Ambulatory Visit: Payer: Medicare Other | Attending: Internal Medicine | Admitting: Internal Medicine

## 2022-01-08 ENCOUNTER — Encounter: Payer: Self-pay | Admitting: Internal Medicine

## 2022-01-08 VITALS — BP 102/62 | HR 75 | Wt 174.2 lb

## 2022-01-08 DIAGNOSIS — E785 Hyperlipidemia, unspecified: Secondary | ICD-10-CM | POA: Diagnosis not present

## 2022-01-08 DIAGNOSIS — Z951 Presence of aortocoronary bypass graft: Secondary | ICD-10-CM

## 2022-01-08 DIAGNOSIS — I5022 Chronic systolic (congestive) heart failure: Secondary | ICD-10-CM

## 2022-01-08 DIAGNOSIS — I255 Ischemic cardiomyopathy: Secondary | ICD-10-CM | POA: Diagnosis not present

## 2022-01-08 DIAGNOSIS — G720 Drug-induced myopathy: Secondary | ICD-10-CM

## 2022-01-08 DIAGNOSIS — T466X5D Adverse effect of antihyperlipidemic and antiarteriosclerotic drugs, subsequent encounter: Secondary | ICD-10-CM

## 2022-01-08 DIAGNOSIS — T466X5A Adverse effect of antihyperlipidemic and antiarteriosclerotic drugs, initial encounter: Secondary | ICD-10-CM

## 2022-01-08 NOTE — Patient Instructions (Signed)
Medication Instructions:   Dr. Debara Pickett has recommended an injectable cholesterol medication  Options:  Repatha or Praluent - once every 14 days - about $25-50/month Leqvio - once every 6 months after initial loading dose - costly, will require patient assistance and appointments for injections at infusion clinic in [place]   *If you need a refill on your cardiac medications before your next appointment, please call your pharmacy*   Lab Work:  FASTING lab work in 3-4 months to check cholesterol  -- NMR lipoprofile, LPa  If you have labs (blood work) drawn today and your tests are completely normal, you will receive your results only by: [place] (if you have [place]) OR A paper copy in the mail If you have any lab test that is abnormal or we need to change your treatment, we will call you to review the results.   Testing/Procedures: NONE   Follow-Up: At [facility], you and your health needs are our priority.  As part of our continuing mission to provide you with exceptional heart care, we have created designated Provider Care Teams.  These Care Teams include your primary Cardiologist (physician) and Advanced Practice Providers (APPs -  Physician Assistants and Nurse Practitioners) who all work together to provide you with the care you need, when you need it.  We recommend signing up for the patient portal called "MyChart".  Sign up information is provided on this After Visit Summary.  MyChart is used to connect with patients for Virtual Visits (Telemedicine).  Patients are able to view lab/test results, encounter notes, upcoming appointments, etc.  Non-urgent messages can be sent to your provider as well.   To learn more about what you can do with MyChart, go to [email or web address].    Your next appointment:    3-4 months with Dr. Debara Pickett -- lipid clinic

## 2022-01-08 NOTE — Progress Notes (Signed)
Virtual Visit via Telephone Note   Because of [name]'s co-morbid illnesses, she is at least at moderate risk for complications without adequate follow up.  This format is felt to be most appropriate for this patient at this time.  The patient did not have access to video technology/had technical difficulties with video requiring transitioning to audio format only (telephone).  All issues noted in this document were discussed and addressed.  No physical exam could be performed with this format.  Please refer to the patient's chart for her consent to telehealth for [facility].   Date:  01/08/2022   ID:  [name], DOB [birth year], MRN [id number] The patient was identified using 2 identifiers.  Evaluation Performed:  New Patient Evaluation  Patient Location:  [place] [id number]  Provider location:   [address], [place] [address] [place], [place] [address]  PCP:  Martinique, Betty G, MD  Cardiologist:  Dorris Carnes, MD Electrophysiologist:  None   Chief Complaint:  Manage dyslipidemia  History of Present Illness:    [name] is a 71 y.o. female who presents via audio/video conferencing for a telehealth visit today.  This is a 71 year old female kindly referred for evaluation management of dyslipidemia.  She has a history of statin myopathy in the past and has contraindication to statin therapy.  She has been maintained on ezetimibe however her lipids have been significantly elevated in the past.  2 years ago total cholesterol was 233 with a triglyceride of 464.  Her most recent lipid showed total cholesterol 187, triglycerides 223 and HDL 27 and LDL 115.  She was referred for evaluation and management of dyslipidemia, given the fact that she is statin intolerant.  He has concerns about medication costs and about injections, understands the importance of lipid-lowering therapy as far as maintaining the health of her bypass grafts and  lowering overall cardiovascular risk.  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[place])    Anxiety    Arthritis    Diabetes mellitus without complication (HCC)    GERD (gastroesophageal reflux disease)    Heart murmur    Hyperlipidemia    Hypertension    Neuromuscular disorder (HCC)    Palpitations    Pneumonia    PONV (postoperative nausea and vomiting)    PTSD (post-traumatic stress disorder)    Ringing of ears, left     Past Surgical History:  Procedure Laterality Date   ABDOMINAL HYSTERECTOMY     partial   ACHILLES TENDON SURGERY Left    APPENDECTOMY     BACK SURGERY     BLADDER SUSPENSION     BREAST SURGERY Right 2003   milk duct removed   CARDIAC CATHETERIZATION     CHOLECYSTECTOMY     CORONARY ARTERY BYPASS GRAFT N/A 11/14/2021   Procedure: CORONARY ARTERY BYPASS GRAFTING (CABG) TIMES THREE USING LIMA AND CRYO VEIN.;  Surgeon: Dahlia Byes, MD;  Location: [place];  Service: Open Heart Surgery;  Laterality: N/A;   FRACTURE SURGERY     collar bone   LEFT HEART CATH AND CORONARY ANGIOGRAPHY N/A 03/08/2021   Procedure: LEFT HEART CATH AND CORONARY ANGIOGRAPHY;  Surgeon: Martinique, Peter M, MD;  Location: [facility];  Service: Cardiovascular;  Laterality: N/A;   NOSE SURGERY     x 2  OTHER SURGICAL HISTORY     Breast Duct removed   PARTIAL HYSTERECTOMY     RECTOCELE REPAIR     TEE WITHOUT CARDIOVERSION N/A 11/14/2021   Procedure: TRANSESOPHAGEAL ECHOCARDIOGRAM (TEE);  Surgeon: Dahlia Byes, MD;  Location: [facility];  Service: Open Heart Surgery;  Laterality: N/A;   TONSILLECTOMY     VASCULAR SURGERY Right    right leg   VEIN SURGERY Right    leg    FAMHx:  Family History  Problem Relation Age of Onset   Diabetes Mother    Alzheimer's disease Mother    Lung cancer Father    Esophageal cancer Brother    Colon cancer Neg Hx     SOCHx:    reports that she has never smoked. She has never used smokeless tobacco. She reports that she does not drink alcohol and does not use drugs.  ALLERGIES:  Allergies  Allergen Reactions   Morphine And Related Itching    With high doses   Other Itching   Statins     Hx intolerance to multiple statins   Penicillins Rash    Has patient had a PCN reaction causing immediate rash, facial/tongue/throat swelling, SOB or lightheadedness with hypotension: Yes Has patient had a PCN reaction causing severe rash involving mucus membranes or skin necrosis: No Has patient had a PCN reaction that required hospitalization: No Has patient had a PCN reaction occurring within the last 10 years: No If all of the above answers are "NO", then may proceed with Cephalosporin use.     MEDS:  Current Meds  Medication Sig   albuterol (VENTOLIN HFA) 108 (90 Base) MCG/ACT inhaler INHALE 2 PUFFS INTO THE LUNGS EVERY 6 HOURS AS NEEDED FOR WHEEZING OR SHORTNESS OF BREATH   Alcohol Swabs (B-D SINGLE USE SWABS REGULAR) PADS Use to test blood sugar 3 times daily   amiodarone (PACERONE) 200 MG tablet Take 1 tablet (200 mg total) by mouth 2 (two) times daily.   aspirin EC 81 MG tablet Take 81 mg by mouth daily. Swallow whole.   Blood Glucose Calibration (ACCU-CHEK AVIVA) SOLN Use as directed   Blood Glucose Monitoring Suppl (ONETOUCH VERIO FLEX SYSTEM) w/Device KIT Use to test blood sugars 1-2 times daily.   clonazePAM (KLONOPIN) 1 MG tablet TAKE 1 TABLET(1 MG) BY MOUTH TWICE DAILY AS NEEDED FOR ANXIETY   clopidogrel (PLAVIX) 75 MG tablet Take 1 tablet (75 mg total) by mouth daily.   Dulaglutide (TRULICITY) 1.5 ZO/1.0RU SOPN Inject 1.5 mg into the skin once a week.   ezetimibe (ZETIA) 10 MG tablet TAKE 1 TABLET(10 MG) BY MOUTH DAILY   fluticasone (FLONASE) 50 MCG/ACT nasal spray Place 2 sprays into both nostrils daily.   gabapentin (NEURONTIN) 600 MG tablet TAKE 1 TABLET BY MOUTH EVERY MORNING AND AT NOON AND EVERY  EVENING AND AT BEDTIME   glucose blood (ONETOUCH VERIO) test strip Use to test blood sugar 1-2 times daily.   guaiFENesin (MUCINEX) 600 MG 12 hr tablet Take 600 mg by mouth daily.   HYDROcodone-acetaminophen (NORCO) 10-325 MG tablet Take 1 tablet by mouth every 6 (six) hours as needed. Do not fill before 02/07/2022   JARDIANCE 10 MG TABS tablet Take 1 tablet (10 mg total) by mouth daily.   Lancets (ONETOUCH ULTRASOFT) lancets Use to test blood sugar 1-2 times daily.   loratadine (CLARITIN) 10 MG tablet Take 10 mg by mouth daily.   melatonin 5 MG TABS Take 1 tablet (5 mg total) by mouth at  bedtime as needed.   metoprolol tartrate (LOPRESSOR) 25 MG tablet Take 1 tablet (25 mg total) by mouth 2 (two) times daily.   multivitamin-lutein (OCUVITE-LUTEIN) CAPS capsule Take 1 capsule by mouth daily.   mupirocin ointment (BACTROBAN) 2 % Apply 1 Application topically daily as needed (skin cancer).   ondansetron (ZOFRAN-ODT) 4 MG disintegrating tablet Take 1 tablet (4 mg total) by mouth daily as needed.   pantoprazole (PROTONIX) 40 MG tablet Take 1 tablet (40 mg total) by mouth daily.   Polyethyl Glycol-Propyl Glycol (SYSTANE) 0.4-0.3 % SOLN Place 1 drop into both eyes daily as needed (Dry eye).   potassium chloride SA (KLOR-CON M) 20 MEQ tablet Take 1 tablet (20 mEq total) by mouth daily.   QUEtiapine (SEROQUEL) 100 MG tablet TAKE 1/2 TABLET BY MOUTH AT BEDTIME.   triamcinolone (KENALOG) 0.1 % paste Use as directed 1 Application in the mouth or throat daily as needed (mouth pain).     ROS: Pertinent items noted in HPI and remainder of comprehensive ROS otherwise negative.  Labs/Other Tests and Data Reviewed:    Recent Labs: 10/01/2021: B Natriuretic Peptide 154.7 11/01/2021: TSH 0.481 11/12/2021: ALT 14 11/20/2021: Hemoglobin 8.6; Platelets 218 11/21/2021: BUN 16; Magnesium 1.4; Potassium 3.5; Sodium 138 11/22/2021: Creatinine, Ser 0.92   Recent Lipid Panel Lab Results  Component Value Date/Time    CHOL 187 08/24/2021 02:05 AM   CHOL 183 12/16/2016 03:17 PM   TRIG 223 (H) 08/24/2021 02:05 AM   HDL 27 (L) 08/24/2021 02:05 AM   HDL 53 12/16/2016 03:17 PM   CHOLHDL 6.9 08/24/2021 02:05 AM   LDLCALC 115 (H) 08/24/2021 02:05 AM   LDLCALC 84 12/16/2016 03:17 PM   LDLDIRECT 140.0 01/28/2021 10:07 AM    Wt Readings from Last 3 Encounters:  01/08/22 174 lb 2.6 oz (79 kg)  01/07/22 174 lb (78.9 kg)  01/06/22 174 lb 1.6 oz (79 kg)     Exam:    Vital Signs:  BP 102/62   Pulse 75   Wt 174 lb 2.6 oz (79 kg)   BMI 26.48 kg/m    Exam not performed due to telephone visit  ASSESSMENT & PLAN:    Mixed dyslipidemia, goal LDL less than 55 (very high risk) Ischemic cardiomyopathy, LVEF to 55% recently Status post CABG x3 (11/2021) Statin intolerance-myopathy  [name] has a mixed dyslipidemia and is well above her goal LDL less than 55 on ezetimibe.  She cannot tolerate statins having had myalgias and apparently myopathy in the past.  She will need additional therapy and is a good candidate for PCSK9 inhibitor.  We discussed briefly the difference between a antibody PCSK9 inhibitors and the RNA inhibitor Leqvio.  We will pursue the most cost effective option for her.  Plan follow-up with a lipid NMR and LP(a) in about 3 to 4 months.  Thanks for the kind referral.  Patient Risk:   After full review of this patients clinical status, I feel that they are at least moderate risk at this time.  Time:   Today, I have spent 20 minutes with the patient with telehealth technology discussing dyslipidemia.     Medication Adjustments/Labs and Tests Ordered: Current medicines are reviewed at length with the patient today.  Concerns regarding medicines are outlined above.   Tests Ordered: Orders Placed This Encounter  Procedures   Lipoprotein A (LPA)   NMR, lipoprofile    Medication Changes: No orders of the defined types were placed in this encounter.   Disposition:  in 4  month(s)  Pixie Casino, MD, [facility], [place] Director of the Advanced Lipid Disorders &  Cardiovascular Risk Reduction Clinic Diplomate of the American Board of Clinical Lipidology Attending Cardiologist  Direct Dial: [phone]  Fax: [phone]  Website:  [email or web address]  Pixie Casino, MD  01/08/2022 9:53 AM

## 2022-01-09 ENCOUNTER — Encounter: Payer: Self-pay | Admitting: Family Medicine

## 2022-01-09 ENCOUNTER — Telehealth: Payer: Self-pay | Admitting: Internal Medicine

## 2022-01-09 DIAGNOSIS — F419 Anxiety disorder, unspecified: Secondary | ICD-10-CM

## 2022-01-09 MED ORDER — REPATHA SURECLICK 140 MG/ML ~~LOC~~ SOAJ: 1.0000 | SUBCUTANEOUS | 11 refills | Status: DC

## 2022-01-09 NOTE — Addendum Note (Signed)
Addended by: [name] on: 01/09/2022 09:24 AM   Modules accepted: Orders

## 2022-01-09 NOTE — Telephone Encounter (Signed)
PA submitted via CMM (Key: [id number])

## 2022-01-09 NOTE — Telephone Encounter (Signed)
Med approved until 01/10/2023

## 2022-01-10 ENCOUNTER — Telehealth: Payer: Self-pay | Admitting: Registered Nurse

## 2022-01-10 MED ORDER — HYDROCODONE-ACETAMINOPHEN 10-325 MG PO TABS
1.0000 | ORAL_TABLET | Freq: Four times a day (QID) | ORAL | 0 refills | Status: DC | PRN
Start: 2022-01-10 — End: 2022-03-06

## 2022-01-10 MED ORDER — CLONAZEPAM 1 MG PO TABS: ORAL_TABLET | ORAL | 0 refills | Status: DC

## 2022-01-10 NOTE — Telephone Encounter (Signed)
PMP was Reviewed.  [facility] out of stock of his Hydrocodone Prescription sent to [facility], [name] is aware via My-Chart message.

## 2022-01-15 NOTE — Progress Notes (Unsigned)
HPI: [name] is a 71 y.o. female, who is here today for chronic disease management.  Last seen on 09/04/21. Since her last visit she underwent coronary artery bypass grafting x 3 vessels, 11/14/2021. She experienced post-surgical atrial fibrillation (AFib),taking Amiodarone 200 mg daily, planning on discontinuing it. She is on Plavix 75 mg daily and Aspirin 81 mg daily. Overall she recovered well.  She mentions having atelectasis seen on CXR and scheduled for another x-ray in December/2023. She feels much better after surgery and very grateful to have [name] as her surgeon. She was discharged to SNF, where she stayed for 3 days because it was determined she did not need to stay longer.  HTN on Metoprolol Tartrate 25 mg bid. HypoK+ on KLOR 20 meq daily. She is on Furosemide 20 mg daily as needed for LE edema. Lab Results  Component Value Date   CREATININE 0.92 11/22/2021   BUN 16 11/21/2021   NA 138 11/21/2021   K 3.5 11/21/2021   CL 99 11/21/2021   CO2 28 11/21/2021   HLD: She has not tolerated statins well, Repatha was recommended by [name] but she expresses concerns about possible side effects and cost of medication and considering not to take it. She is on Zetia 10 mg daily. Lab Results  Component Value Date   CHOL 187 08/24/2021   HDL 27 (L) 08/24/2021   LDLCALC 115 (H) 08/24/2021   LDLDIRECT 140.0 01/28/2021   TRIG 223 (H) 08/24/2021   CHOLHDL 6.9 08/24/2021   Back pain is still a problem, not better after surgery. She is receiving epidural injections every four to five months for pain management and it helps. She is also taking hydrocodone-acetaminophen 10-325 mg for pain but find it not always effective, follows with pain management.  Diabetes Mellitus II: Dx'ed in 2014. She is on Trulicity 1.5 mg weekly and Jardiance 10 mg daily. - Checking BG at home: 120-130's. - eye exam: 6-7 months ago. - foot exam: 04/2020. - Negative for symptoms of  hypoglycemia, polyuria, polydipsia, foot ulcers/trauma + Numbness LE's, peripheral neuropathy on Gabapentin 600 mg tid.  Lab Results  Component Value Date   HGBA1C 6.3 (H) 08/22/2021   Lab Results  Component Value Date   MICROALBUR 0.8 12/27/2019   She has made dietary changes, including avoiding cakes, cobblers, and candy bars, and consuming more vegetables.  Anemia:She has not noted blood in stool,melena,or gross hematuria. Colonoscopy 02/2017.  Lab Results  Component Value Date   WBC 6.5 11/20/2021   HGB 8.6 (L) 11/20/2021   HCT 25.9 (L) 11/20/2021   MCV 95.9 11/20/2021   PLT 218 11/20/2021   Anxiety,depression,and insomnia: She reports having some worsening anxiety and having experienced three panic attacks in seven days a few weeks ago but back to her baseline. It was trigger by issues when trying to have washer and dryer delivered to her house.  She is on Clonazepam 1 mg bid prn, Seroquel 100 mg 1/2 tab at bedtime.  Nausea exacerbated by panic attacks, Zofran has helped tremendously with symptom, she does not take it frequently. GERD on Pantoprazole 40 mg daily.  She has returned to work part-time since the heart surgery.   Constipation: Worse since 11/2021. She has tried OTC stool softeners. Small bowel movements, "hard balls", and having to strain. She would like prescription medication.  Review of Systems  Constitutional:  Negative for activity change, appetite change and fever.  HENT:  Negative for mouth sores, nosebleeds and sore throat.  Eyes:  Negative for redness and visual disturbance.  Respiratory:  Negative for cough, shortness of breath and wheezing.   Cardiovascular:  Negative for chest pain and palpitations.  Gastrointestinal:  Negative for abdominal pain, nausea and vomiting.  Genitourinary:  Negative for decreased urine volume and dysuria.  Musculoskeletal:  Positive for back pain. Negative for gait problem.  Skin:  Negative for rash.  Neurological:   Negative for syncope, weakness and headaches.  Psychiatric/Behavioral:  Negative for confusion and hallucinations. The patient is nervous/anxious.   Rest of ROS see pertinent positives and negatives in HPI.  Current Outpatient Medications on File Prior to Visit  Medication Sig Dispense Refill   furosemide (LASIX) 20 MG tablet Take 20 mg by mouth daily as needed.     albuterol (VENTOLIN HFA) 108 (90 Base) MCG/ACT inhaler INHALE 2 PUFFS INTO THE LUNGS EVERY 6 HOURS AS NEEDED FOR WHEEZING OR SHORTNESS OF BREATH 18 g 2   Alcohol Swabs (B-D SINGLE USE SWABS REGULAR) PADS Use to test blood sugar 3 times daily 100 each 3   amiodarone (PACERONE) 200 MG tablet Take 1 tablet (200 mg total) by mouth 2 (two) times daily.     aspirin EC 81 MG tablet Take 81 mg by mouth daily. Swallow whole.     Blood Glucose Calibration (ACCU-CHEK AVIVA) SOLN Use as directed 1 each 2   Blood Glucose Monitoring Suppl (ONETOUCH VERIO FLEX SYSTEM) w/Device KIT Use to test blood sugars 1-2 times daily. 1 kit 0   clonazePAM (KLONOPIN) 1 MG tablet TAKE 1 TABLET(1 MG) BY MOUTH TWICE DAILY AS NEEDED FOR ANXIETY 60 tablet 0   clopidogrel (PLAVIX) 75 MG tablet Take 1 tablet (75 mg total) by mouth daily.     Dulaglutide (TRULICITY) 1.5 HA/1.9FX SOPN Inject 1.5 mg into the skin once a week. 13 mL 3   Evolocumab (REPATHA SURECLICK) 902 MG/ML SOAJ Inject 1 Dose into the skin every 14 (fourteen) days. 2 mL 11   ezetimibe (ZETIA) 10 MG tablet TAKE 1 TABLET(10 MG) BY MOUTH DAILY 90 tablet 2   fluticasone (FLONASE) 50 MCG/ACT nasal spray Place 2 sprays into both nostrils daily.     gabapentin (NEURONTIN) 600 MG tablet TAKE 1 TABLET BY MOUTH EVERY MORNING AND AT NOON AND EVERY EVENING AND AT BEDTIME 120 tablet 3   glucose blood (ONETOUCH VERIO) test strip Use to test blood sugar 1-2 times daily. 100 each 12   guaiFENesin (MUCINEX) 600 MG 12 hr tablet Take 600 mg by mouth daily.     HYDROcodone-acetaminophen (NORCO) 10-325 MG tablet Take 1  tablet by mouth every 6 (six) hours as needed. 120 tablet 0   JARDIANCE 10 MG TABS tablet Take 1 tablet (10 mg total) by mouth daily. 90 tablet 3   Lancets (ONETOUCH ULTRASOFT) lancets Use to test blood sugar 1-2 times daily. 100 each 12   loratadine (CLARITIN) 10 MG tablet Take 10 mg by mouth daily.     melatonin 5 MG TABS Take 1 tablet (5 mg total) by mouth at bedtime as needed.  0   metoprolol tartrate (LOPRESSOR) 25 MG tablet Take 1 tablet (25 mg total) by mouth 2 (two) times daily.     multivitamin-lutein (OCUVITE-LUTEIN) CAPS capsule Take 1 capsule by mouth daily.     mupirocin ointment (BACTROBAN) 2 % Apply 1 Application topically daily as needed (skin cancer).     ondansetron (ZOFRAN-ODT) 4 MG disintegrating tablet Take 1 tablet (4 mg total) by mouth daily as needed. Dietrich  tablet 2   pantoprazole (PROTONIX) 40 MG tablet Take 1 tablet (40 mg total) by mouth daily. 30 tablet 3   Polyethyl Glycol-Propyl Glycol (SYSTANE) 0.4-0.3 % SOLN Place 1 drop into both eyes daily as needed (Dry eye).     potassium chloride SA (KLOR-CON M) 20 MEQ tablet Take 1 tablet (20 mEq total) by mouth daily. 30 tablet 3   QUEtiapine (SEROQUEL) 100 MG tablet TAKE 1/2 TABLET BY MOUTH AT BEDTIME. 45 tablet 2   triamcinolone (KENALOG) 0.1 % paste Use as directed 1 Application in the mouth or throat daily as needed (mouth pain).     No current facility-administered medications on file prior to visit.    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[place])    Anxiety    Arthritis    Diabetes mellitus without complication (HCC)    GERD (gastroesophageal reflux disease)    Heart murmur    Hyperlipidemia    Hypertension    Neuromuscular disorder (HCC)    Palpitations    Pneumonia    PONV (postoperative nausea and vomiting)    PTSD (post-traumatic stress disorder)    Ringing of ears, left    Allergies  Allergen Reactions   Morphine And Related Itching    With high doses   Other Itching   Statins      Hx intolerance to multiple statins   Penicillins Rash    Has patient had a PCN reaction causing immediate rash, facial/tongue/throat swelling, SOB or lightheadedness with hypotension: Yes Has patient had a PCN reaction causing severe rash involving mucus membranes or skin necrosis: No Has patient had a PCN reaction that required hospitalization: No Has patient had a PCN reaction occurring within the last 10 years: No If all of the above answers are "NO", then may proceed with Cephalosporin use.     Social History   Socioeconomic History   Marital status: Widowed    Spouse name: Not on file   Number of children: 1   Years of education: Not on file   Highest education level: 12th grade  Occupational History   Occupation: retired    Comment: work part time  Tobacco Use   Smoking status: Never   Smokeless tobacco: Never   Tobacco comments:    Never smoke 11/01/21  Vaping Use   Vaping Use: Never used  Substance and Sexual Activity   Alcohol use: Never   Drug use: No   Sexual activity: Not on file  Other Topics Concern   Not on file  Social History Narrative   Not on file   Social Determinants of Health   Financial Resource Strain: Low Risk  (01/16/2022)   Overall Financial Resource Strain (CARDIA)    Difficulty of Paying Living Expenses: Not very hard  Food Insecurity: Food Insecurity Present (01/16/2022)   Hunger Vital Sign    Worried About [place] in the Last Year: Sometimes true    Ran Out of Food in the Last Year: Sometimes true  Transportation Needs: No Transportation Needs (01/16/2022)   PRAPARE - Hydrologist (Medical): No    Lack of Transportation (Non-Medical): No  Physical Activity: Insufficiently Active (01/16/2022)   Exercise Vital Sign    Days of Exercise per Week: 3 days    Minutes of Exercise per Session: 30 min  Stress: Stress Concern Present (01/16/2022)   [place]  Feeling of Stress : To some extent  Social Connections: Moderately Integrated (01/16/2022)   Social Connection and Isolation Panel [NHANES]    Frequency of Communication with Friends and Family: More than three times a week    Frequency of Social Gatherings with Friends and Family: Twice a week    Attends Religious Services: More than 4 times per year    Active Member of [employer] or Organizations: Yes    Attends Archivist Meetings: More than 4 times per year    Marital Status: Widowed   Vitals:   01/17/22 1437  BP: 122/80  Pulse: 66  Resp: 19  Temp: 98.2 F (36.8 C)  SpO2: 97%  Body mass index is 26.97 kg/m.  Physical Exam Vitals and nursing note reviewed.  Constitutional:      General: She is not in acute distress.    Appearance: She is well-developed.  HENT:     Head: Normocephalic and atraumatic.     Mouth/Throat:     Mouth: Mucous membranes are moist.     Pharynx: Oropharynx is clear.  Eyes:     Conjunctiva/sclera: Conjunctivae normal.  Cardiovascular:     Rate and Rhythm: Normal rate and regular rhythm.     Pulses:          Dorsalis pedis pulses are 2+ on the right side and 2+ on the left side.     Heart sounds: No murmur heard. Pulmonary:     Effort: Pulmonary effort is normal. No respiratory distress.     Breath sounds: Normal breath sounds.  Abdominal:     Palpations: Abdomen is soft. There is no hepatomegaly or mass.     Tenderness: There is no abdominal tenderness.  Lymphadenopathy:     Cervical: No cervical adenopathy.  Skin:    General: Skin is warm.     Findings: No erythema or rash.  Neurological:     General: No focal deficit present.     Mental Status: She is alert and oriented to person, place, and time.     Cranial Nerves: No cranial nerve deficit.     Gait: Gait normal.  Psychiatric:     Comments: Well groomed, good eye contact.   ASSESSMENT AND PLAN:  [name] was seen today for  follow-up.  Diagnoses and all orders for this visit: Orders Placed This Encounter  Procedures   Microalbumin / creatinine urine ratio   POC HgB A1c   Lab Results  Component Value Date   HGBA1C 5.5 01/17/2022   Lab Results  Component Value Date   MICROALBUR 0.4 01/17/2022   MICROALBUR 0.8 12/27/2019   Constipation, unspecified constipation type Some of her meds can be aggravating problem. Problem has not improved with OTC medications. Linzess low dose recommended, some side effects discussed. She can try Linzess 72 mcg and increase dose to 2 caps if not effective. Continue adequate hydration and fiber intake.  -     linaclotide (LINZESS) 72 MCG capsule; Take 1 capsule (72 mcg total) by mouth daily before breakfast.  Essential hypertension BP adequately controlled. Continue metoprolol titrate 25 mg twice daily and low-salt diet.  Anxiety disorder, unspecified type Problem is overall stable. Continue clonazepam 1 mg twice daily as needed.  HYPOKALEMIA Potassium 3.5 in 11/2021. Continue K-Lor 20 mK daily. We discussed some side effects of furosemide.  Hyperlipidemia associated with type 2 diabetes mellitus ([place]) LDL 115 in 08/2021, she has not tolerated statins. We discussed some benefits of Repatha, encouraged to try.  She is going to call Jeanette. Lysbeth Penner office next week to start process of patient assistance program. Continue Zetia 10 mg daily and low-fat diet.  Depression, major, recurrent, in partial remission ([place]) Stable. Continue Seroquel 100 mg 1/2 tablet at bedtime.  Type 2 diabetes mellitus with neurological complications (HCC) Problem is well controlled, HgA1C went from 6.3 to 5.5. No changes in [place] or Trulicity for now. Annual eye exam, periodic dental and foot care recommended. F/U in 4 months.  Iron deficiency anemia, unspecified iron deficiency anemia type She is not on iron supplementation. We will plan on checking CBC next visit. She is due for  colonoscopy in 02/2022, she is trying to establish with GI provider closer to home, she will let me know if she needs a new referral.  Spinal stenosis of lumbar region, unspecified whether neurogenic claudication present Periodic epidural injections have helped. Continue following with pain management and neurosurgeon.  I spent a total of 52 minutes in both face to face and non face to face activities for this visit on the date of this encounter. During this time history was obtained and documented, examination was performed, prior labs reviewed, and assessment/plan discussed. and are considering asking pain management to adjust or change the medication. She is planning on getting the COVID vaccine, flu shot, and second shingles shot at her pharmacy.   Return in about 4 months (around 05/20/2022).  Izora Benn G. Martin[place], MD  [facility]. [place] office.

## 2022-01-17 ENCOUNTER — Ambulatory Visit (INDEPENDENT_AMBULATORY_CARE_PROVIDER_SITE_OTHER): Payer: Medicare Other | Admitting: Family Medicine

## 2022-01-17 ENCOUNTER — Encounter: Payer: Self-pay | Admitting: Family Medicine

## 2022-01-17 VITALS — BP 122/80 | HR 66 | Temp 98.2°F | Resp 19 | Ht 68.0 in | Wt 177.4 lb

## 2022-01-17 DIAGNOSIS — M48061 Spinal stenosis, lumbar region without neurogenic claudication: Secondary | ICD-10-CM

## 2022-01-17 DIAGNOSIS — E785 Hyperlipidemia, unspecified: Secondary | ICD-10-CM

## 2022-01-17 DIAGNOSIS — F419 Anxiety disorder, unspecified: Secondary | ICD-10-CM

## 2022-01-17 DIAGNOSIS — I10 Essential (primary) hypertension: Secondary | ICD-10-CM

## 2022-01-17 DIAGNOSIS — K5900 Constipation, unspecified: Secondary | ICD-10-CM

## 2022-01-17 DIAGNOSIS — F32A Depression, unspecified: Secondary | ICD-10-CM

## 2022-01-17 DIAGNOSIS — D509 Iron deficiency anemia, unspecified: Secondary | ICD-10-CM

## 2022-01-17 DIAGNOSIS — E876 Hypokalemia: Secondary | ICD-10-CM

## 2022-01-17 DIAGNOSIS — E1169 Type 2 diabetes mellitus with other specified complication: Secondary | ICD-10-CM

## 2022-01-17 DIAGNOSIS — E1149 Type 2 diabetes mellitus with other diabetic neurological complication: Secondary | ICD-10-CM | POA: Diagnosis not present

## 2022-01-17 DIAGNOSIS — F3341 Major depressive disorder, recurrent, in partial remission: Secondary | ICD-10-CM

## 2022-01-17 LAB — POCT GLYCOSYLATED HEMOGLOBIN (HGB A1C): Hemoglobin A1C: 5.5 % (ref 4.0–5.6)

## 2022-01-17 MED ORDER — LINACLOTIDE 72 MCG PO CAPS: 72.0000 ug | ORAL_CAPSULE | Freq: Every day | ORAL | 1 refills | Status: DC

## 2022-01-17 NOTE — Patient Instructions (Addendum)
A few things to remember from today's visit:  Type 2 diabetes mellitus with neurological complications ([place]) - Plan: POC HgB A1c, Microalbumin / creatinine urine ratio  Essential hypertension  Anxiety disorder, unspecified type  HYPOKALEMIA  Hyperlipidemia associated with type 2 diabetes mellitus (HCC)  Constipation, unspecified constipation type - Plan: linaclotide (LINZESS) 72 MCG capsule  Linzess 1-2 caps before breakfast, planned on staying home when you try. No changes in meds. Next visit we will check kidney and anemia. Give a try to [place].  If you need refills for medications you take chronically, please call your pharmacy. Do not use My Chart to request refills or for acute issues that need immediate attention. If you send a my chart message, it may take a few days to be addressed, specially if I am not in the office.  Please be sure medication list is accurate. If a new problem present, please set up appointment sooner than planned today.

## 2022-01-17 NOTE — Assessment & Plan Note (Signed)
Problem is well controlled, HgA1C went from 6.3 to 5.5. No changes in [place] or Trulicity for now. Annual eye exam, periodic dental and foot care recommended. F/U in 4 months.

## 2022-01-18 LAB — MICROALBUMIN / CREATININE URINE RATIO
Creatinine, Urine: 51 mg/dL (ref 20–275)
Microalb Creat Ratio: 8 mcg/mg creat (ref <30)
Microalb, Ur: 0.4 mg/dL

## 2022-01-18 MED ORDER — CLONAZEPAM 1 MG PO TABS: ORAL_TABLET | ORAL | 3 refills | Status: DC

## 2022-01-20 ENCOUNTER — Encounter: Payer: Self-pay | Admitting: Family Medicine

## 2022-01-20 ENCOUNTER — Other Ambulatory Visit: Payer: Self-pay

## 2022-01-20 DIAGNOSIS — R300 Dysuria: Secondary | ICD-10-CM

## 2022-01-20 NOTE — Telephone Encounter (Signed)
Dr Martinique    The medicine you called in for constipation is to expensive.  The co pay was $41 after insurance.  Please try generic or something else.  .    This has been a very exhausting day

## 2022-01-21 ENCOUNTER — Other Ambulatory Visit: Payer: Medicare Other

## 2022-01-21 ENCOUNTER — Other Ambulatory Visit (INDEPENDENT_AMBULATORY_CARE_PROVIDER_SITE_OTHER): Payer: Medicare Other

## 2022-01-21 DIAGNOSIS — R300 Dysuria: Secondary | ICD-10-CM | POA: Diagnosis not present

## 2022-01-21 LAB — URINALYSIS, ROUTINE W REFLEX MICROSCOPIC
Bilirubin Urine: NEGATIVE
Ketones, ur: NEGATIVE
Nitrite: POSITIVE — AB
Specific Gravity, Urine: 1.02 (ref 1.000–1.030)
Total Protein, Urine: NEGATIVE
Urine Glucose: >=1000 — AB
Urobilinogen, UA: 0.2 (ref 0.0–1.0)
pH: 5.5 (ref 5.0–8.0)

## 2022-01-22 ENCOUNTER — Other Ambulatory Visit: Payer: Self-pay | Admitting: Family Medicine

## 2022-01-22 DIAGNOSIS — N390 Urinary tract infection, site not specified: Secondary | ICD-10-CM

## 2022-01-22 MED ORDER — NITROFURANTOIN MONOHYD MACRO 100 MG PO CAPS: 100.0000 mg | ORAL_CAPSULE | Freq: Two times a day (BID) | ORAL | 0 refills | Status: AC

## 2022-01-23 ENCOUNTER — Encounter: Payer: Self-pay | Admitting: Cardiothoracic Surgery

## 2022-01-24 LAB — URINE CULTURE
MICRO NUMBER:: 14061901
SPECIMEN QUALITY:: ADEQUATE

## 2022-01-27 ENCOUNTER — Encounter: Payer: Self-pay | Admitting: Cardiothoracic Surgery

## 2022-01-31 ENCOUNTER — Ambulatory Visit (HOSPITAL_COMMUNITY)
Admission: RE | Admit: 2022-01-31 | Discharge: 2022-01-31 | Disposition: A | Discharge status: Home / Self Care | Payer: Medicare Other | Source: Ambulatory Visit | Attending: Cardiology | Admitting: Cardiology

## 2022-01-31 VITALS — BP 120/70 | HR 60 | Wt 176.8 lb

## 2022-01-31 DIAGNOSIS — I493 Ventricular premature depolarization: Secondary | ICD-10-CM | POA: Diagnosis not present

## 2022-01-31 DIAGNOSIS — I255 Ischemic cardiomyopathy: Secondary | ICD-10-CM | POA: Diagnosis not present

## 2022-01-31 DIAGNOSIS — Z79899 Other long term (current) drug therapy: Secondary | ICD-10-CM | POA: Diagnosis not present

## 2022-01-31 DIAGNOSIS — E785 Hyperlipidemia, unspecified: Secondary | ICD-10-CM | POA: Insufficient documentation

## 2022-01-31 DIAGNOSIS — E1149 Type 2 diabetes mellitus with other diabetic neurological complication: Secondary | ICD-10-CM

## 2022-01-31 DIAGNOSIS — I110 Hypertensive heart disease with heart failure: Secondary | ICD-10-CM | POA: Diagnosis not present

## 2022-01-31 DIAGNOSIS — Z7982 Long term (current) use of aspirin: Secondary | ICD-10-CM | POA: Diagnosis not present

## 2022-01-31 DIAGNOSIS — I4891 Unspecified atrial fibrillation: Secondary | ICD-10-CM | POA: Insufficient documentation

## 2022-01-31 DIAGNOSIS — I5022 Chronic systolic (congestive) heart failure: Secondary | ICD-10-CM | POA: Diagnosis not present

## 2022-01-31 DIAGNOSIS — Z951 Presence of aortocoronary bypass graft: Secondary | ICD-10-CM | POA: Insufficient documentation

## 2022-01-31 DIAGNOSIS — Z7902 Long term (current) use of antithrombotics/antiplatelets: Secondary | ICD-10-CM | POA: Insufficient documentation

## 2022-01-31 DIAGNOSIS — Z7984 Long term (current) use of oral hypoglycemic drugs: Secondary | ICD-10-CM | POA: Diagnosis not present

## 2022-01-31 DIAGNOSIS — I2510 Atherosclerotic heart disease of native coronary artery without angina pectoris: Secondary | ICD-10-CM | POA: Diagnosis not present

## 2022-01-31 LAB — BASIC METABOLIC PANEL
Anion gap: 7 (ref 5–15)
BUN: 17 mg/dL (ref 8–23)
CO2: 28 mmol/L (ref 22–32)
Calcium: 9.1 mg/dL (ref 8.9–10.3)
Chloride: 105 mmol/L (ref 98–111)
Creatinine, Ser: 0.99 mg/dL (ref 0.44–1.00)
GFR, Estimated: >60 mL/min (ref >60)
Glucose, Bld: 99 mg/dL (ref 70–99)
Potassium: 3.9 mmol/L (ref 3.5–5.1)
Sodium: 140 mmol/L (ref 135–145)

## 2022-01-31 MED ORDER — JARDIANCE 10 MG PO TABS: 10.0000 mg | ORAL_TABLET | Freq: Every day | ORAL | 3 refills | Status: DC

## 2022-01-31 MED ORDER — EZETIMIBE 10 MG PO TABS: ORAL_TABLET | ORAL | 3 refills | Status: DC

## 2022-01-31 MED ORDER — CLOPIDOGREL BISULFATE 75 MG PO TABS: 75.0000 mg | ORAL_TABLET | Freq: Every day | ORAL | 3 refills | Status: DC

## 2022-01-31 MED ORDER — METOPROLOL SUCCINATE ER 25 MG PO TB24: 25.0000 mg | ORAL_TABLET | Freq: Every day | ORAL | 3 refills | Status: DC

## 2022-01-31 NOTE — Patient Instructions (Signed)
STOP Metoprolol Tartrate   START  Toprol XL 25 mg daily.  Labs done today, your results will be available in MyChart, we will contact you for abnormal readings.  Your physician has requested that you have an echocardiogram. Echocardiography is a painless test that uses sound waves to create images of your heart. It provides your doctor with information about the size and shape of your heart and how well your heart's chambers and valves are working. This procedure takes approximately one hour. There are no restrictions for this procedure. Please do NOT wear cologne, perfume, aftershave, or lotions (deodorant is allowed). Please arrive 15 minutes prior to your appointment time.  You have been referred to Cardiac rehab at [facility]. They will cal you to arrange your appointment.  Your physician recommends that you schedule a follow-up appointment in: 2 months  If you have any questions or concerns before your next appointment please send [place] a message through [place] or call our office at [phone].    TO LEAVE A MESSAGE FOR THE NURSE SELECT OPTION 2, PLEASE LEAVE A MESSAGE INCLUDING: YOUR NAME DATE OF BIRTH CALL BACK NUMBER REASON FOR CALL**this is important as we prioritize the call backs  YOU WILL RECEIVE A CALL BACK THE SAME DAY AS LONG AS YOU CALL BEFORE 4:00 PM  At the [facility], you and your health needs are our priority. As part of our continuing mission to provide you with exceptional heart care, we have created designated Provider Care Teams. These Care Teams include your primary Cardiologist (physician) and Advanced Practice Providers (APPs- Physician Assistants and Nurse Practitioners) who all work together to provide you with the care you need, when you need it.   You may see any of the following providers on your designated Care Team at your next follow up: Dr Glori Bickers Dr Loralie Champagne Dr. Roxana Hires, NP Lyda Jester,  [facility] [place], [place] Forestine Na, NP Audry Riles, PharmD   Please be sure to bring in all your medications bottles to every appointment.

## 2022-02-02 NOTE — Progress Notes (Signed)
ADVANCED HF CLINIC CONSULT NOTE  Primary Care: Martinique, Betty G, MD CVTS: Dr. Prescott Gum HF Cardiologist: Dr. Aundra Dubin  HPI: [name] is a 71 y.o. female w/ chronic systolic heart failure, CAD, PVCs, type 2DM, HTN and HLD.    Referred for evaluation of palpitations 6/22. Cardiac monitor showed 24% PVC burden. Echo EF 55-60%, RV normal.    11/22 she was ordered to get stress test prior to surgical clearance for back surgery. This showed medium to large defects in the apical anterior/anterior septal distribution as well as basal inferior lateral distribution which was suggestive of scar and mild peri-infarct ischemia. She was subsequently referred for [facility] 12/22 which showed severe obstructive CAD with subtotal 99% mid-LAD and complex LCx disease with severe stenoses involving the bifurication of 4 branches with CT surgery consult recommended as LCx disease was not amenable to PCI. Repeat echo showed her EF was mildly reduced at 45-50% with mild LVH, normal RV function and trivial MR. She did meet with Dr. Kipp Brood on 03/22/2021 and by review of notes was very hesitant about proceeding with surgical intervention and was informed to make them aware with her ultimate decision about proceeding with CABG.    She went for a 2nd opinion and saw Dr. Prescott Gum 4/23 who also recommended CABG, but pt remained hesitant.    Presented to ED 08/22/21 w/ CC, nausea/vomiting and diarrhea. Notes outlined that she had CP and dyspnea but pt denies this. She was found to have an AKI and diagnosed w/ colitis. Also treated for CAP. HsTn 86>>80. Level not consistent with ACS. Felt most likely demand ischemia in the setting of dehydration (vomiting/diarrhea), AKI, acute infection. Echo repeated EF 40-45% w septal apical and inferior apical hypokinesis.    She finally had CABG in 8/23 with LIMA-LAD, SVG-OM1, SVG-OM2.  She had transient post-op atrial fibrillation.    She returns for followup of CHF and CAD.  She seems  to be doing well post-CABG.  She is not on a statin given myalgias.  She has been reluctant to take Repatha. She denies exertional dyspnea or chest pain.  She is back to working part-time at [employer]. No palpitations.  No orthopnea/PND.  No lightheadedness.    ECG (personally reviewed): NSR, PACs, poor RWP  Labs (7/23): K 4.4, creatinine 1.28 Labs (8/23): K 3.5, creatinine 0.99   PMH: 1.  Chronic systolic CHF: Ischemic cardiomyopathy.  - Echo (12/22): EF 45-50%, RV normal - Echo (5/23): EF 40-45%, RV normal  2. CAD: LHC (10/22) with subtotal mid LAD stenosis, complete LCx disease with severe stenoses involving bifurcation of 4 branches of the LCx.  - CABG (8/23) with LIMA-LAD, SVG-OM1, SVG-OM2 3. Atrial fibrillation: Transient post-CABG.   4. Hyperlipidemia: Unable to take statins.  5. HTN 6. Type 2 diabetes 7. PVCs: Zio monitor (8/23) with 23% PVCs  Review of Systems: All systems reviewed and negative except as per HPI.    Current Outpatient Medications  Medication Sig Dispense Refill   albuterol (VENTOLIN HFA) 108 (90 Base) MCG/ACT inhaler INHALE 2 PUFFS INTO THE LUNGS EVERY 6 HOURS AS NEEDED FOR WHEEZING OR SHORTNESS OF BREATH 18 g 2   Alcohol Swabs (B-D SINGLE USE SWABS REGULAR) PADS Use to test blood sugar 3 times daily 100 each 3   aspirin EC 81 MG tablet Take 81 mg by mouth daily. Swallow whole.     Blood Glucose Calibration (ACCU-CHEK AVIVA) SOLN Use as directed 1 each 2   Blood Glucose  Monitoring Suppl ([place]) w/Device KIT Use to test blood sugars 1-2 times daily. 1 kit 0   clonazePAM (KLONOPIN) 1 MG tablet TAKE 1 TABLET(1 MG) BY MOUTH TWICE DAILY AS NEEDED FOR ANXIETY 60 tablet 3   Dulaglutide (TRULICITY) 1.5 HE/5.2DP SOPN Inject 1.5 mg into the skin once a week. 13 mL 3   Evolocumab (REPATHA SURECLICK) 824 MG/ML SOAJ Inject 1 Dose into the skin every 14 (fourteen) days. 2 mL 11   fluticasone (FLONASE) 50 MCG/ACT nasal spray Place 2 sprays  into both nostrils daily.     furosemide (LASIX) 20 MG tablet Take 20 mg by mouth daily as needed.     gabapentin (NEURONTIN) 600 MG tablet TAKE 1 TABLET BY MOUTH EVERY MORNING AND AT NOON AND EVERY EVENING AND AT BEDTIME 120 tablet 3   guaiFENesin (MUCINEX) 600 MG 12 hr tablet Take 600 mg by mouth daily.     HYDROcodone-acetaminophen (NORCO) 10-325 MG tablet Take 1 tablet by mouth every 6 (six) hours as needed. 120 tablet 0   Lancets (ONETOUCH ULTRASOFT) lancets Use to test blood sugar 1-2 times daily. 100 each 12   loratadine (CLARITIN) 10 MG tablet Take 10 mg by mouth daily.     melatonin 5 MG TABS Take 1 tablet (5 mg total) by mouth at bedtime as needed.  0   metoprolol succinate (TOPROL XL) 25 MG 24 hr tablet Take 1 tablet (25 mg total) by mouth daily. 90 tablet 3   multivitamin-lutein (OCUVITE-LUTEIN) CAPS capsule Take 1 capsule by mouth daily.     ondansetron (ZOFRAN-ODT) 4 MG disintegrating tablet Take 1 tablet (4 mg total) by mouth daily as needed. 20 tablet 2   pantoprazole (PROTONIX) 40 MG tablet Take 1 tablet (40 mg total) by mouth daily. 30 tablet 3   Polyethyl Glycol-Propyl Glycol (SYSTANE) 0.4-0.3 % SOLN Place 1 drop into both eyes daily as needed (Dry eye).     potassium chloride SA (KLOR-CON M) 20 MEQ tablet Take 1 tablet (20 mEq total) by mouth daily. 30 tablet 3   QUEtiapine (SEROQUEL) 100 MG tablet TAKE 1/2 TABLET BY MOUTH AT BEDTIME. 45 tablet 2   triamcinolone (KENALOG) 0.1 % paste Use as directed 1 Application in the mouth or throat daily as needed (mouth pain).     amiodarone (PACERONE) 200 MG tablet Take 1 tablet (200 mg total) by mouth 2 (two) times daily.     clopidogrel (PLAVIX) 75 MG tablet Take 1 tablet (75 mg total) by mouth daily. 90 tablet 3   ezetimibe (ZETIA) 10 MG tablet TAKE 1 TABLET(10 MG) BY MOUTH DAILY 90 tablet 3   glucose blood (ONETOUCH VERIO) test strip Use to test blood sugar 1-2 times daily. 100 each 12   JARDIANCE 10 MG TABS tablet Take 1 tablet (10  mg total) by mouth daily. 90 tablet 3   linaclotide (LINZESS) 72 MCG capsule Take 1 capsule (72 mcg total) by mouth daily before breakfast. 30 capsule 1   mupirocin ointment (BACTROBAN) 2 % Apply 1 Application topically daily as needed (skin cancer).     No current facility-administered medications for this encounter.   Allergies  Allergen Reactions   Morphine And Related Itching    With high doses   Other Itching   Statins     Hx intolerance to multiple statins   Penicillins Rash    Has patient had a PCN reaction causing immediate rash, facial/tongue/throat swelling, SOB or lightheadedness with hypotension: Yes Has patient had  a PCN reaction causing severe rash involving mucus membranes or skin necrosis: No Has patient had a PCN reaction that required hospitalization: No Has patient had a PCN reaction occurring within the last 10 years: No If all of the above answers are "NO", then may proceed with Cephalosporin use.    Social History   Socioeconomic History   Marital status: Widowed    Spouse name: Not on file   Number of children: 1   Years of education: Not on file   Highest education level: 12th grade  Occupational History   Occupation: retired    Comment: work part time  Tobacco Use   Smoking status: Never   Smokeless tobacco: Never   Tobacco comments:    Never smoke 11/01/21  Vaping Use   Vaping Use: Never used  Substance and Sexual Activity   Alcohol use: Never   Drug use: No   Sexual activity: Not on file  Other Topics Concern   Not on file  Social History Narrative   Not on file   Social Determinants of Health   Financial Resource Strain: Low Risk  (01/16/2022)   Overall Financial Resource Strain (CARDIA)    Difficulty of Paying Living Expenses: Not very hard  Food Insecurity: Food Insecurity Present (01/16/2022)   Hunger Vital Sign    Worried About [place] in the Last Year: Sometimes true    Ran Out of Food in the Last Year: Sometimes  true  Transportation Needs: No Transportation Needs (01/16/2022)   PRAPARE - Hydrologist (Medical): No    Lack of Transportation (Non-Medical): No  Physical Activity: Insufficiently Active (01/16/2022)   Exercise Vital Sign    Days of Exercise per Week: 3 days    Minutes of Exercise per Session: 30 min  Stress: Stress Concern Present (01/16/2022)   [place]    Feeling of Stress : To some extent  Social Connections: Moderately Integrated (01/16/2022)   Social Connection and Isolation Panel [NHANES]    Frequency of Communication with Friends and Family: More than three times a week    Frequency of Social Gatherings with Friends and Family: Twice a week    Attends Religious Services: More than 4 times per year    Active Member of [employer] or Organizations: Yes    Attends Archivist Meetings: More than 4 times per year    Marital Status: Widowed  Intimate Partner Violence: Not At Risk (01/07/2022)   Humiliation, Afraid, Rape, and Kick questionnaire    Fear of Current or Ex-Partner: No    Emotionally Abused: No    Physically Abused: No    Sexually Abused: No   Family History  Problem Relation Age of Onset   Diabetes Mother    Alzheimer's disease Mother    Lung cancer Father    Esophageal cancer Brother    Colon cancer Neg Hx    BP 120/70   Pulse 60   Wt 80.2 kg (176 lb 12.8 oz)   SpO2 98%   BMI 26.88 kg/m   PHYSICAL EXAM: General: NAD Neck: No JVD, no thyromegaly or thyroid nodule.  Lungs: Clear to auscultation bilaterally with normal respiratory effort. CV: Nondisplaced PMI.  Heart regular S1/S2, no S3/S4, no murmur.  No peripheral edema.  No carotid bruit.  Normal pedal pulses.  Abdomen: Soft, nontender, no hepatosplenomegaly, no distention.  Skin: Intact without lesions or rashes.  Neurologic: Alert and  oriented x 3.  Psych: Normal affect. Extremities: No clubbing or  cyanosis.  HEENT: Normal.   ASSESSMENT & PLAN: 1.  Chronic Systolic Heart Failure: Ischemic cardiomyopathy.  Echo 12/22 EF 45-50% => LHC with 2 vessel CAD w/ high grade mLAD disease.  Echo 5/23 with EF 40-45%.  She had CABG x 3 in 8/23.  Not volume overloaded on exam, NYHA class I-II symptoms.  - Stop metoprolol tartrate, start Toprol XL 25 mg daily.   - Continue Jardiance 10 mg daily. BMET today.  - I will arrange for echo.  If EF remains low, will continue to titrate up GDMT.  - She has Lasix for prn use, does not appear to need it regularly.  2. CAD: LHC (12/22) with 2V CAD w/ high grade mLAD disease=> pt declined CABG initially but had CABG with LIMA-LAD, SVG-OM1 and SVG-OM2 in 8/23.  No chest pain.  - Continue ASA 81 mg daily  - Continue Plavix.  - Continue ? blocker.  - Continue Zetia 10 mg daily, check lipids.  - [place] for cardiac rehab.  3. Frequent PVCs: 23% burden on Zio 8/23, possibly ischemic-driven.   No palpitations and no PVCs on ECG today.   - Would like to repeat Zio post-CABG but apparently insurance will not cover.  - Continue beta blocker.  4. Hyperlipidemia: Unable to tolerate statins.  On Zetia.  Has been reluctant to take Repatha but now agreeable.  - Will refer back to lipid clinic to get started on Repatha.  Goal LDL < 55.  5. Atrial fibrillation: Transient post-CABG.  Now off amiodarone.  - If recurs, will need anticoagulation.   Followup APP 2 months.   Loralie Champagne 02/02/2022

## 2022-02-18 ENCOUNTER — Ambulatory Visit (HOSPITAL_COMMUNITY)
Admission: RE | Admit: 2022-02-18 | Discharge: 2022-02-18 | Disposition: A | Discharge status: Home / Self Care | Payer: Medicare Other | Source: Ambulatory Visit | Attending: Cardiology | Admitting: Cardiology

## 2022-02-18 DIAGNOSIS — E785 Hyperlipidemia, unspecified: Secondary | ICD-10-CM | POA: Diagnosis not present

## 2022-02-18 DIAGNOSIS — I110 Hypertensive heart disease with heart failure: Secondary | ICD-10-CM | POA: Insufficient documentation

## 2022-02-18 DIAGNOSIS — E119 Type 2 diabetes mellitus without complications: Secondary | ICD-10-CM | POA: Diagnosis not present

## 2022-02-18 DIAGNOSIS — I5022 Chronic systolic (congestive) heart failure: Secondary | ICD-10-CM | POA: Insufficient documentation

## 2022-02-18 DIAGNOSIS — Z951 Presence of aortocoronary bypass graft: Secondary | ICD-10-CM | POA: Diagnosis not present

## 2022-02-18 DIAGNOSIS — I082 Rheumatic disorders of both aortic and tricuspid valves: Secondary | ICD-10-CM | POA: Insufficient documentation

## 2022-02-18 LAB — ECHOCARDIOGRAM COMPLETE
AR max vel: 1.97 cm2
AV Area VTI: 1.82 cm2
AV Area mean vel: 2.03 cm2
AV Mean grad: 6 mmHg
AV Peak grad: 9.6 mmHg
Ao pk vel: 1.55 m/s
Area-P 1/2: 3.91 cm2
S' Lateral: 3.2 cm

## 2022-02-20 ENCOUNTER — Telehealth (HOSPITAL_COMMUNITY): Payer: Self-pay

## 2022-02-20 ENCOUNTER — Encounter (HOSPITAL_COMMUNITY)
Admission: RE | Admit: 2022-02-20 | Discharge: 2022-02-20 | Disposition: A | Discharge status: Home / Self Care | Payer: Medicare Other | Source: Ambulatory Visit | Attending: Cardiology | Admitting: Cardiology

## 2022-02-20 DIAGNOSIS — I5022 Chronic systolic (congestive) heart failure: Secondary | ICD-10-CM

## 2022-02-20 MED ORDER — SPIRONOLACTONE 25 MG PO TABS: 12.5000 mg | ORAL_TABLET | Freq: Every day | ORAL | 3 refills | Status: DC

## 2022-02-20 NOTE — Telephone Encounter (Signed)
Patient advised and verbalized understanding,lab appointment scheduled,lab orders entered. New Rx sent into patients pharmacy. Patient was very upset about this call and started crying.I throughly expalined her results and explained that our team is here with her   Meds ordered this encounter  Medications   spironolactone (ALDACTONE) 25 MG tablet    Sig: Take 0.5 tablets (12.5 mg total) by mouth daily.    Dispense:  45 tablet    Refill:  3   Orders Placed This Encounter  Procedures   Basic metabolic panel    Standing Status:   Future    Standing Expiration Date:   02/21/2023    Order Specific Question:   Release to patient    Answer:   Immediate    Order Specific Question:   Release to patient    Answer:   Immediate [1]

## 2022-02-20 NOTE — Telephone Encounter (Signed)
-----   Message from Larey Dresser, MD sent at 02/18/2022  4:12 PM EST ----- EF mildly reduced, 45-40%.  Would suggest she add spironolactone 12.5 mg daily for cardiomyopathy, BMET in 10 days.

## 2022-02-20 NOTE — Progress Notes (Signed)
Incomplete Session Note  Patient Details  Name: [name] MRN: [id number] Date of Birth: 11-Sep-1950 Referring Provider:  Dr. Rona Ravens Parsley did not complete her rehab session.  Pt is not ready to participate in CR at this time. She has a lot going on currently, as she has several doctor appointments in the next couple of months in [place]. She also has chronic back pain, and she will get a shot for this in the coming weeks. She is also concerned about her leg weakness. I recommended that she should contact her PCP or pain management physician to request a PT referral to better address these problems. We agreed that she should try PT, and then she could consider trying CR in the future. I did review our heart failure education and went over low sodium diet with her. Pt was given our business card, and instructed to call us when she is ready to do CR.

## 2022-02-26 ENCOUNTER — Encounter: Payer: Self-pay | Admitting: Gastroenterology

## 2022-03-01 ENCOUNTER — Encounter (HOSPITAL_COMMUNITY): Payer: Self-pay

## 2022-03-03 ENCOUNTER — Telehealth (HOSPITAL_COMMUNITY): Payer: Self-pay | Admitting: *Deleted

## 2022-03-03 ENCOUNTER — Other Ambulatory Visit (HOSPITAL_COMMUNITY): Payer: Self-pay

## 2022-03-03 ENCOUNTER — Encounter (HOSPITAL_COMMUNITY): Payer: Self-pay

## 2022-03-03 ENCOUNTER — Other Ambulatory Visit (HOSPITAL_COMMUNITY): Payer: Self-pay | Admitting: *Deleted

## 2022-03-03 DIAGNOSIS — I5022 Chronic systolic (congestive) heart failure: Secondary | ICD-10-CM

## 2022-03-03 DIAGNOSIS — M5416 Radiculopathy, lumbar region: Secondary | ICD-10-CM | POA: Diagnosis not present

## 2022-03-03 DIAGNOSIS — E785 Hyperlipidemia, unspecified: Secondary | ICD-10-CM

## 2022-03-03 NOTE — Telephone Encounter (Signed)
Send referral again.  Best option will be Repatha or Leqvio.

## 2022-03-03 NOTE — Telephone Encounter (Signed)
Pt called stating she had not heard from lipid clinic I placed a second referral marked as urgent. Pt asked if she needed to start an oral medication for her cholesterol.

## 2022-03-04 ENCOUNTER — Encounter: Payer: Self-pay | Admitting: Family Medicine

## 2022-03-05 ENCOUNTER — Other Ambulatory Visit (HOSPITAL_COMMUNITY): Payer: Medicare Other

## 2022-03-06 ENCOUNTER — Encounter: Payer: Medicare Other | Attending: Physical Medicine & Rehabilitation | Admitting: Registered Nurse

## 2022-03-06 ENCOUNTER — Telehealth (HOSPITAL_COMMUNITY): Payer: Self-pay

## 2022-03-06 VITALS — BP 117/70 | HR 75 | Ht 68.0 in | Wt 175.0 lb

## 2022-03-06 DIAGNOSIS — Z79891 Long term (current) use of opiate analgesic: Secondary | ICD-10-CM | POA: Diagnosis not present

## 2022-03-06 DIAGNOSIS — I5022 Chronic systolic (congestive) heart failure: Secondary | ICD-10-CM

## 2022-03-06 DIAGNOSIS — M47812 Spondylosis without myelopathy or radiculopathy, cervical region: Secondary | ICD-10-CM | POA: Insufficient documentation

## 2022-03-06 DIAGNOSIS — M5416 Radiculopathy, lumbar region: Secondary | ICD-10-CM | POA: Insufficient documentation

## 2022-03-06 DIAGNOSIS — G894 Chronic pain syndrome: Secondary | ICD-10-CM | POA: Insufficient documentation

## 2022-03-06 DIAGNOSIS — M2550 Pain in unspecified joint: Secondary | ICD-10-CM

## 2022-03-06 DIAGNOSIS — E1142 Type 2 diabetes mellitus with diabetic polyneuropathy: Secondary | ICD-10-CM

## 2022-03-06 DIAGNOSIS — Z5181 Encounter for therapeutic drug level monitoring: Secondary | ICD-10-CM | POA: Insufficient documentation

## 2022-03-06 DIAGNOSIS — M542 Cervicalgia: Secondary | ICD-10-CM | POA: Insufficient documentation

## 2022-03-06 DIAGNOSIS — M5412 Radiculopathy, cervical region: Secondary | ICD-10-CM | POA: Insufficient documentation

## 2022-03-06 MED ORDER — HYDROCODONE-ACETAMINOPHEN 10-325 MG PO TABS: 1.0000 | ORAL_TABLET | Freq: Four times a day (QID) | ORAL | 0 refills | Status: DC | PRN

## 2022-03-06 MED ORDER — GABAPENTIN 600 MG PO TABS: ORAL_TABLET | ORAL | 3 refills | Status: DC

## 2022-03-06 NOTE — Telephone Encounter (Signed)
Left message for patient stating referral to physical therapy has been sent

## 2022-03-06 NOTE — Progress Notes (Signed)
Subjective:    Patient ID: [name], female    DOB: 05-Jul-1950, 71 y.o.   MRN: [id number]  [id number] [name] is a 71 y.o. female who returns for follow up appointment for chronic pain and medication refill. She states her pain is located in her neck radiating into her bilateral shoulders and lower back pain radiating into her bilateral lower extremities. She rates her pain 9. Her current exercise regime is walking and performing stretching exercises.   [name] Morphine equivalent is 40.00 MME. She is also prescribed Clonazepam by Dr. Martinique .We have discussed the black box warning of using opioids and benzodiazepines. I highlighted the dangers of using these drugs together and discussed the adverse events including respiratory suppression, overdose, cognitive impairment and importance of compliance with current regimen. We will continue to monitor and adjust as indicated.     Last Oral Swab was Performed on 08/09/2021, it was consistent.   Pain Inventory Average Pain 9 Pain Right Now 9 My pain is sharp, burning, tingling, and aching  In the last 24 hours, has pain interfered with the following? General activity 8 Relation with others 8 Enjoyment of life 9 What TIME of day is your pain at its worst? morning , daytime, evening, and night Sleep (in general) Fair  Pain is worse with: walking, bending, standing, and some activites Pain improves with: rest, heat/ice, pacing activities, medication, and injections Relief from Meds: 10  Family History  Problem Relation Age of Onset   Diabetes Mother    Alzheimer's disease Mother    Lung cancer Father    Esophageal cancer Brother    Colon cancer Neg Hx    Social History   Socioeconomic History   Marital status: Widowed    Spouse name: Not on file   Number of children: 1   Years of education: Not on file   Highest education level: 12th grade  Occupational History   Occupation: retired    Comment: work part time   Tobacco Use   Smoking status: Never   Smokeless tobacco: Never   Tobacco comments:    Never smoke 11/01/21  Vaping Use   Vaping Use: Never used  Substance and Sexual Activity   Alcohol use: Never   Drug use: No   Sexual activity: Not on file  Other Topics Concern   Not on file  Social History Narrative   Not on file   Social Determinants of Health   Financial Resource Strain: Low Risk  (01/16/2022)   Overall Financial Resource Strain (CARDIA)    Difficulty of Paying Living Expenses: Not very hard  Food Insecurity: Food Insecurity Present (01/16/2022)   Hunger Vital Sign    Worried About [place] in the Last Year: Sometimes true    Ran Out of Food in the Last Year: Sometimes true  Transportation Needs: No Transportation Needs (01/16/2022)   PRAPARE - Hydrologist (Medical): No    Lack of Transportation (Non-Medical): No  Physical Activity: Insufficiently Active (01/16/2022)   Exercise Vital Sign    Days of Exercise per Week: 3 days    Minutes of Exercise per Session: 30 min  Stress: Stress Concern Present (01/16/2022)   [place]    Feeling of Stress : To some extent  Social Connections: Moderately Integrated (01/16/2022)   Social Connection and Isolation Panel [NHANES]    Frequency of Communication with Friends and Family: More  than three times a week    Frequency of Social Gatherings with Friends and Family: Twice a week    Attends Religious Services: More than 4 times per year    Active Member of [employer] or Organizations: Yes    Attends Archivist Meetings: More than 4 times per year    Marital Status: Widowed   Past Surgical History:  Procedure Laterality Date   ABDOMINAL HYSTERECTOMY     partial   ACHILLES TENDON SURGERY Left    APPENDECTOMY     BACK SURGERY     BLADDER SUSPENSION     BREAST SURGERY Right 2003   milk duct removed   CARDIAC  CATHETERIZATION     CHOLECYSTECTOMY     CORONARY ARTERY BYPASS GRAFT N/A 11/14/2021   Procedure: CORONARY ARTERY BYPASS GRAFTING (CABG) TIMES THREE USING LIMA AND CRYO VEIN.;  Surgeon: Dahlia Byes, MD;  Location: [facility];  Service: Open Heart Surgery;  Laterality: N/A;   FRACTURE SURGERY     collar bone   LEFT HEART CATH AND CORONARY ANGIOGRAPHY N/A 03/08/2021   Procedure: LEFT HEART CATH AND CORONARY ANGIOGRAPHY;  Surgeon: Martinique, Peter M, MD;  Location: [facility];  Service: Cardiovascular;  Laterality: N/A;   NOSE SURGERY     x 2    OTHER SURGICAL HISTORY     Breast Duct removed   PARTIAL HYSTERECTOMY     RECTOCELE REPAIR     TEE WITHOUT CARDIOVERSION N/A 11/14/2021   Procedure: TRANSESOPHAGEAL ECHOCARDIOGRAM (TEE);  Surgeon: Dahlia Byes, MD;  Location: [facility];  Service: Open Heart Surgery;  Laterality: N/A;   TONSILLECTOMY     VASCULAR SURGERY Right    right leg   VEIN SURGERY Right    leg   Past Surgical History:  Procedure Laterality Date   ABDOMINAL HYSTERECTOMY     partial   ACHILLES TENDON SURGERY Left    APPENDECTOMY     BACK SURGERY     BLADDER SUSPENSION     BREAST SURGERY Right 2003   milk duct removed   CARDIAC CATHETERIZATION     CHOLECYSTECTOMY     CORONARY ARTERY BYPASS GRAFT N/A 11/14/2021   Procedure: CORONARY ARTERY BYPASS GRAFTING (CABG) TIMES THREE USING LIMA AND CRYO VEIN.;  Surgeon: Dahlia Byes, MD;  Location: [facility];  Service: Open Heart Surgery;  Laterality: N/A;   FRACTURE SURGERY     collar bone   LEFT HEART CATH AND CORONARY ANGIOGRAPHY N/A 03/08/2021   Procedure: LEFT HEART CATH AND CORONARY ANGIOGRAPHY;  Surgeon: Martinique, Peter M, MD;  Location: [facility];  Service: Cardiovascular;  Laterality: N/A;   NOSE SURGERY     x 2    OTHER SURGICAL HISTORY     Breast Duct removed   PARTIAL HYSTERECTOMY     RECTOCELE REPAIR     TEE WITHOUT CARDIOVERSION N/A 11/14/2021   Procedure: TRANSESOPHAGEAL ECHOCARDIOGRAM (TEE);  Surgeon:  Dahlia Byes, MD;  Location: [facility];  Service: Open Heart Surgery;  Laterality: N/A;   TONSILLECTOMY     VASCULAR SURGERY Right    right leg   VEIN SURGERY Right    leg   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[place])    Anxiety    Arthritis    Diabetes mellitus without complication (HCC)    GERD (gastroesophageal reflux disease)    Heart murmur    Hyperlipidemia    Hypertension    Neuromuscular disorder ([place])  Palpitations    Pneumonia    PONV (postoperative nausea and vomiting)    PTSD (post-traumatic stress disorder)    Ringing of ears, left    BP 117/70   Pulse 75   Ht '5\' 8"'$  (1.727 m)   Wt 175 lb (79.4 kg)   SpO2 96%   BMI 26.61 kg/m   Opioid Risk Score:   Fall Risk Score:  `1  Depression screen [facility] 2/9     01/18/2022   12:53 PM 01/07/2022    2:29 PM 10/31/2021    2:55 PM 09/04/2021    2:25 PM 06/10/2021    2:54 PM 05/14/2021    3:21 PM 04/23/2021    2:28 PM  Depression screen PHQ 2/9  Decreased Interest 0 0 0 2 0 0 1  Down, Depressed, Hopeless 0 0 0 '1 3 2 1  '$ PHQ - 2 Score 0 0 0 '3 3 2 2  '$ Altered sleeping '1   1  2 1  '$ Tired, decreased energy '1   1  1 1  '$ Change in appetite 0   '1  2 1  '$ Feeling bad or failure about yourself  1   0  1 0  Trouble concentrating 0   0  1 0  Moving slowly or fidgety/restless 0   0  1 0  Suicidal thoughts 0   0  0 0  PHQ-9 Score '3   6  10 5  '$ Difficult doing work/chores Somewhat difficult   Not difficult at all  Somewhat difficult Somewhat difficult      Review of Systems  Musculoskeletal:  Positive for back pain.       Left shoulder pain Bilateral thigh pain Bilateral shin pain Bilateral heel pain       Objective:   Physical Exam Vitals and nursing note reviewed.  Constitutional:      Appearance: Normal appearance.  Cardiovascular:     Rate and Rhythm: Normal rate and regular rhythm.     Pulses: Normal pulses.     Heart sounds: Normal heart sounds.  Pulmonary:     Effort: Pulmonary effort is  normal.     Breath sounds: Normal breath sounds.  Musculoskeletal:     Cervical back: Normal range of motion and neck supple.     Comments: Normal Muscle Bulk and Muscle Testing Reveals:  Upper Extremities: Full ROM and Muscle Strength 5/5 Left AC Joint Tenderness  Thoracic Paraspinal Tenderness: T-1-T-7 Mainly Left Side , Lumbar Paraspinal Tenderness: L-4-L-5 Lower Extremities: Full ROM and Muscle Strength 5/5 Arises from chair with ease Narrow Based  Gait     Skin:    General: Skin is warm and dry.  Neurological:     Mental Status: She is alert and oriented to person, place, and time.  Psychiatric:        Mood and Affect: Mood normal.        Behavior: Behavior normal.         Assessment & Plan:  1.Chronic cervicalgia with documented spondylosis on MRI, per Dr. Naaman Plummer Note and Cervical Radiculitis: Continue Gabapentin,  Continue HEP as Tolerated. 03/06/2022 2. Left shoulder pain most consistent with left rotator cuff syndrome. Mild DJD.  Continue HEP as tolerated.Continue to monitor. 03/06/2022. Refilled:  Hydrocodone 10/325 mg one tablet every 6 hours as needed for pain. #120. Second script sent for the following month. We will continue the opioid monitoring program, this consists of regular clinic visits, examinations, urine drug screen, pill counts as well as  use of [place] Controlled Substance Reporting system. A 12 month History has been reviewed on the [place] on 03/06/2022. 3. Left wrist/finger pain most consistent with OA, ?post traumatic. No complaints today. 03/06/2022 4. Anxiety: Continue Klonopin, PCP prescribing. Continue to monitor. 03/06/2022 5. Restless Leg syndrome: Continue Gabapentin. 03/06/2022 7. Sacroiliac inflammation: S/P Right  SI injection on 09/20/2019, with No relief noted. Ms. Dauphinee seen Dr Marlou Sa in regards to her pain. Dr Saintclair Halsted. Following.  Continue to Monitor. 03/06/2022. 8. Myofascial Pain: Continue  Robaxin as needed. 03/06/2022.  9. Bilateral feet with neuropathic pain: Increase Gabapentin 600 mg 4 times a day. Continue to monitor. 03/06/2022. 10.  Herniated Nucleus Pulposus L2-3 with recurrent disc rupture: S/P  Posterior Lumbar Interbody Fusion  - Lumbar two-Lumbar three on 01/13/2020 by Dr Saintclair Halsted.  Dr Saintclair Halsted Following. 03/06/2022   F/U in 2 months

## 2022-03-09 ENCOUNTER — Encounter: Payer: Self-pay | Admitting: Registered Nurse

## 2022-03-10 ENCOUNTER — Other Ambulatory Visit: Payer: Self-pay | Admitting: Family Medicine

## 2022-03-10 MED ORDER — MECLIZINE HCL 25 MG PO TABS: 12.5000 mg | ORAL_TABLET | Freq: Every evening | ORAL | 0 refills | Status: DC | PRN

## 2022-03-11 ENCOUNTER — Telehealth (HOSPITAL_COMMUNITY): Payer: Self-pay

## 2022-03-11 NOTE — Telephone Encounter (Signed)
Received a medical clearance form from [facility] , requesting Cardiology clearance for the following procedure: transformational epidural injection. Medical clearance form was signed by Theador Hawthorne and successfully faxed to [phone] on Friday, December 1,. Form will be scanned into patients chart.

## 2022-03-13 ENCOUNTER — Other Ambulatory Visit: Payer: Self-pay | Admitting: Cardiothoracic Surgery

## 2022-03-13 DIAGNOSIS — Z951 Presence of aortocoronary bypass graft: Secondary | ICD-10-CM

## 2022-03-13 NOTE — Progress Notes (Signed)
cxr 

## 2022-03-17 ENCOUNTER — Encounter: Payer: Self-pay | Admitting: Cardiothoracic Surgery

## 2022-03-17 ENCOUNTER — Ambulatory Visit (HOSPITAL_COMMUNITY)
Admission: RE | Admit: 2022-03-17 | Discharge: 2022-03-17 | Disposition: A | Discharge status: Home / Self Care | Payer: Medicare Other | Source: Ambulatory Visit | Attending: Cardiology | Admitting: Cardiology

## 2022-03-17 ENCOUNTER — Ambulatory Visit
Admission: RE | Admit: 2022-03-17 | Discharge: 2022-03-17 | Disposition: A | Discharge status: Home / Self Care | Payer: Medicare Other | Source: Ambulatory Visit | Attending: Cardiothoracic Surgery | Admitting: Cardiothoracic Surgery

## 2022-03-17 ENCOUNTER — Encounter (HOSPITAL_COMMUNITY): Payer: Self-pay | Admitting: Cardiology

## 2022-03-17 ENCOUNTER — Other Ambulatory Visit (HOSPITAL_COMMUNITY): Payer: Self-pay | Admitting: Cardiology

## 2022-03-17 ENCOUNTER — Ambulatory Visit (INDEPENDENT_AMBULATORY_CARE_PROVIDER_SITE_OTHER): Payer: Medicare Other | Admitting: Cardiothoracic Surgery

## 2022-03-17 ENCOUNTER — Inpatient Hospital Stay (HOSPITAL_COMMUNITY)
Admission: RE | Admit: 2022-03-17 | Discharge: 2022-03-17 | Disposition: A | Discharge status: Home / Self Care | Payer: Medicare Other | Source: Ambulatory Visit | Attending: Cardiology | Admitting: Cardiology

## 2022-03-17 VITALS — BP 139/68 | HR 44 | Resp 20 | Ht 68.0 in | Wt 169.0 lb

## 2022-03-17 VITALS — BP 120/70 | HR 89

## 2022-03-17 DIAGNOSIS — I491 Atrial premature depolarization: Secondary | ICD-10-CM | POA: Diagnosis not present

## 2022-03-17 DIAGNOSIS — I2510 Atherosclerotic heart disease of native coronary artery without angina pectoris: Secondary | ICD-10-CM | POA: Diagnosis not present

## 2022-03-17 DIAGNOSIS — Z7984 Long term (current) use of oral hypoglycemic drugs: Secondary | ICD-10-CM | POA: Diagnosis not present

## 2022-03-17 DIAGNOSIS — Z7985 Long-term (current) use of injectable non-insulin antidiabetic drugs: Secondary | ICD-10-CM | POA: Insufficient documentation

## 2022-03-17 DIAGNOSIS — E119 Type 2 diabetes mellitus without complications: Secondary | ICD-10-CM | POA: Diagnosis not present

## 2022-03-17 DIAGNOSIS — R002 Palpitations: Secondary | ICD-10-CM | POA: Insufficient documentation

## 2022-03-17 DIAGNOSIS — I255 Ischemic cardiomyopathy: Secondary | ICD-10-CM | POA: Diagnosis not present

## 2022-03-17 DIAGNOSIS — Z79899 Other long term (current) drug therapy: Secondary | ICD-10-CM | POA: Insufficient documentation

## 2022-03-17 DIAGNOSIS — I5022 Chronic systolic (congestive) heart failure: Secondary | ICD-10-CM | POA: Insufficient documentation

## 2022-03-17 DIAGNOSIS — Z7902 Long term (current) use of antithrombotics/antiplatelets: Secondary | ICD-10-CM | POA: Insufficient documentation

## 2022-03-17 DIAGNOSIS — I498 Other specified cardiac arrhythmias: Secondary | ICD-10-CM

## 2022-03-17 DIAGNOSIS — Z951 Presence of aortocoronary bypass graft: Secondary | ICD-10-CM

## 2022-03-17 DIAGNOSIS — R008 Other abnormalities of heart beat: Secondary | ICD-10-CM | POA: Diagnosis not present

## 2022-03-17 DIAGNOSIS — E785 Hyperlipidemia, unspecified: Secondary | ICD-10-CM | POA: Insufficient documentation

## 2022-03-17 DIAGNOSIS — I493 Ventricular premature depolarization: Secondary | ICD-10-CM | POA: Insufficient documentation

## 2022-03-17 DIAGNOSIS — I4891 Unspecified atrial fibrillation: Secondary | ICD-10-CM | POA: Diagnosis not present

## 2022-03-17 DIAGNOSIS — I110 Hypertensive heart disease with heart failure: Secondary | ICD-10-CM | POA: Diagnosis not present

## 2022-03-17 LAB — BASIC METABOLIC PANEL
Anion gap: 9 (ref 5–15)
BUN: 13 mg/dL (ref 8–23)
CO2: 25 mmol/L (ref 22–32)
Calcium: 9.4 mg/dL (ref 8.9–10.3)
Chloride: 104 mmol/L (ref 98–111)
Creatinine, Ser: 0.92 mg/dL (ref 0.44–1.00)
GFR, Estimated: >60 mL/min (ref >60)
Glucose, Bld: 112 mg/dL — ABNORMAL HIGH (ref 70–99)
Potassium: 4.2 mmol/L (ref 3.5–5.1)
Sodium: 138 mmol/L (ref 135–145)

## 2022-03-17 LAB — LIPID PANEL
Cholesterol: 214 mg/dL — ABNORMAL HIGH (ref 0–200)
HDL: 49 mg/dL (ref >40)
LDL Cholesterol: 122 mg/dL — ABNORMAL HIGH (ref 0–99)
Total CHOL/HDL Ratio: 4.4 RATIO
Triglycerides: 214 mg/dL — ABNORMAL HIGH (ref <150)
VLDL: 43 mg/dL — ABNORMAL HIGH (ref 0–40)

## 2022-03-17 LAB — TSH: TSH: 0.044 u[IU]/mL — ABNORMAL LOW (ref 0.350–4.500)

## 2022-03-17 LAB — MAGNESIUM: Magnesium: 2.2 mg/dL (ref 1.7–2.4)

## 2022-03-17 MED ORDER — METOPROLOL SUCCINATE ER 25 MG PO TB24: 25.0000 mg | ORAL_TABLET | Freq: Two times a day (BID) | ORAL | 3 refills | Status: DC

## 2022-03-17 NOTE — Progress Notes (Signed)
HPI: Patient presents for final post CABG visit 3 and half months status post CABG x 3 for multivessel coronary disease and reduced LVEF.  Patient has been doing well and is completing cardiac rehab program at [facility].  No symptoms of angina.  She is having some bilateral leg neuropathic pain which improves with positioning.  Today her palpable pulse is 44 however by EKG rhythm strip she is in a normal rhythm with bigeminy with a heart rate of greater than 80.  I have reached out to her cardiologist Dr. Aundra Dubin for guidance.  Current Outpatient Medications  Medication Sig Dispense Refill   albuterol (VENTOLIN HFA) 108 (90 Base) MCG/ACT inhaler INHALE 2 PUFFS INTO THE LUNGS EVERY 6 HOURS AS NEEDED FOR WHEEZING OR SHORTNESS OF BREATH 18 g 2   Alcohol Swabs (B-D SINGLE USE SWABS REGULAR) PADS Use to test blood sugar 3 times daily 100 each 3   Blood Glucose Monitoring Suppl (ONETOUCH VERIO FLEX SYSTEM) w/Device KIT Use to test blood sugars 1-2 times daily. 1 kit 0   clonazePAM (KLONOPIN) 1 MG tablet TAKE 1 TABLET(1 MG) BY MOUTH TWICE DAILY AS NEEDED FOR ANXIETY 60 tablet 3   clopidogrel (PLAVIX) 75 MG tablet Take 1 tablet (75 mg total) by mouth daily. 90 tablet 3   Dulaglutide (TRULICITY) 1.5 OF/7.5ZW SOPN Inject 1.5 mg into the skin once a week. 13 mL 3   ezetimibe (ZETIA) 10 MG tablet TAKE 1 TABLET(10 MG) BY MOUTH DAILY 90 tablet 3   fluticasone (FLONASE) 50 MCG/ACT nasal spray Place 2 sprays into both nostrils daily.     furosemide (LASIX) 20 MG tablet Take 20 mg by mouth daily as needed.     gabapentin (NEURONTIN) 600 MG tablet TAKE 1 TABLET BY MOUTH EVERY MORNING AND AT NOON AND EVERY EVENING AND AT BEDTIME 120 tablet 3   guaiFENesin (MUCINEX) 600 MG 12 hr tablet Take 600 mg by mouth daily.     HYDROcodone-acetaminophen (NORCO) 10-325 MG tablet Take 1 tablet by mouth every 6 (six) hours as needed. 120 tablet 0   JARDIANCE 10 MG TABS tablet Take 1 tablet (10 mg total) by mouth daily. 90  tablet 3   Lancets (ONETOUCH ULTRASOFT) lancets Use to test blood sugar 1-2 times daily. 100 each 12   loratadine (CLARITIN) 10 MG tablet Take 10 mg by mouth daily.     melatonin 5 MG TABS Take 1 tablet (5 mg total) by mouth at bedtime as needed.  0   metoprolol succinate (TOPROL XL) 25 MG 24 hr tablet Take 1 tablet (25 mg total) by mouth daily. 90 tablet 3   multivitamin-lutein (OCUVITE-LUTEIN) CAPS capsule Take 1 capsule by mouth daily.     ondansetron (ZOFRAN-ODT) 4 MG disintegrating tablet Take 1 tablet (4 mg total) by mouth daily as needed. 20 tablet 2   pantoprazole (PROTONIX) 40 MG tablet Take 1 tablet (40 mg total) by mouth daily. 30 tablet 3   Polyethyl Glycol-Propyl Glycol (SYSTANE) 0.4-0.3 % SOLN Place 1 drop into both eyes daily as needed (Dry eye).     potassium chloride SA (KLOR-CON M) 20 MEQ tablet Take 1 tablet (20 mEq total) by mouth daily. 30 tablet 3   triamcinolone (KENALOG) 0.1 % paste Use as directed 1 Application in the mouth or throat daily as needed (mouth pain).     aspirin EC 81 MG tablet Take 81 mg by mouth daily. Swallow whole. (Patient not taking: Reported on 03/17/2022)     linaclotide (  LINZESS) 72 MCG capsule Take 1 capsule (72 mcg total) by mouth daily before breakfast. 30 capsule 1   meclizine (ANTIVERT) 25 MG tablet Take 0.5-1 tablets (12.5-25 mg total) by mouth at bedtime as needed for up to 15 days for dizziness. (Patient not taking: Reported on 03/17/2022) 15 tablet 0   mupirocin ointment (BACTROBAN) 2 % Apply 1 Application topically daily as needed (skin cancer).     QUEtiapine (SEROQUEL) 100 MG tablet TAKE 1/2 TABLET BY MOUTH AT BEDTIME. (Patient not taking: Reported on 03/17/2022) 45 tablet 2   spironolactone (ALDACTONE) 25 MG tablet Take 0.5 tablets (12.5 mg total) by mouth daily. (Patient not taking: Reported on 03/17/2022) 45 tablet 3   No current facility-administered medications for this visit.     Physical Exam: Blood pressure 139/68, pulse (!)  44, resp. rate 20, height _0  (1.727 m), weight 169 lb (76.7 kg), SpO2 98 %.  Review of EKG rhythm strip shows heart rate greater than 80, junctional rhythm with PVCs       Exam    General- alert and comfortable.  Sternal incision well-healed.  Left leg incision well-healed.    Neck- no JVD, no cervical adenopathy palpable, no carotid bruit   Lungs- clear without rales, wheezes   Cor- regular rate and rhythm, no murmur , gallop   Abdomen- soft, non-tender   Extremities - warm, non-tender, minimal edema   Neuro- oriented, appropriate, no focal weakness   Diagnostic Tests: Chest x-ray images reviewed showing clear lung fields no CHF, no pleural effusions, sternal wires intact.  Impression: Patient has recovered from her coronary bypass grafting x 3 performed in August.  She knows she needs to continue aspirin with Plavix indefinitely since she had CryoVein used as conduit since she had no vein remaining after vein stripping, no usable vein from the contralateral leg. Sternal restrictions are lifted.  Patient is set up to be evaluated by her cardiologist Dr. Aundra Dubin today for her arrhythmia/PVCs. Plan: Return as needed.   Dahlia Byes, MD [facility] [phone]

## 2022-03-17 NOTE — Progress Notes (Signed)
ADVANCED HF CLINIC CONSULT NOTE  Primary Care: [place], Betty G, MD CVTS: Dr. Prescott Gum HF Cardiologist: Dr. Aundra Dubin  HPI: [name] is a 71 y.o. female w/ chronic systolic heart failure, CAD, PVCs, type 2DM, HTN and HLD.    Referred for evaluation of palpitations 6/22. Cardiac monitor showed 24% PVC burden. Echo EF 55-60%, RV normal.    11/22 she was ordered to get stress test prior to surgical clearance for back surgery. This showed medium to large defects in the apical anterior/anterior septal distribution as well as basal inferior lateral distribution which was suggestive of scar and mild peri-infarct ischemia. She was subsequently referred for [facility] 12/22 which showed severe obstructive CAD with subtotal 99% mid-LAD and complex LCx disease with severe stenoses involving the bifurication of 4 branches with CT surgery consult recommended as LCx disease was not amenable to PCI. Repeat echo showed her EF was mildly reduced at 45-50% with mild LVH, normal RV function and trivial MR. She did meet with Dr. Kipp Brood on 03/22/2021 and by review of notes was very hesitant about proceeding with surgical intervention and was informed to make them aware with her ultimate decision about proceeding with CABG.    She went for a 2nd opinion and saw Dr. Prescott Gum 4/23 who also recommended CABG, but pt remained hesitant.    Presented to ED 08/22/21 w/ CC, nausea/vomiting and diarrhea. Notes outlined that she had CP and dyspnea but pt denies this. She was found to have an AKI and diagnosed w/ colitis. Also treated for CAP. HsTn 86>>80. Level not consistent with ACS. Felt most likely demand ischemia in the setting of dehydration (vomiting/diarrhea), AKI, acute infection. Echo repeated EF 40-45% w septal apical and inferior apical hypokinesis.    She finally had CABG in 8/23 with LIMA-LAD, SVG-OM1, SVG-OM2.  She had transient post-op atrial fibrillation.   Post-op echo in 11/23 showed EF 45-50% with normal RV.   I started her on spironolactone but she could not tolerate this due to diarrhea that started on spironolactone and resolved once she had stopped it.   Patient comes in as an urgent work-in from [facility].  She was noted to have HR in 40s.  She has a pulse ox at home and had found this at home periodically over the last few days and HR was 44 in [facility] clinic.  ECG per report showed bigeminal PVCs with rate in 80s (I am unable to pull up the ECG from [facility]).  In our office, HR 89 on ECG with 2 PVCs.  She is not particularly symptomatic but is very anxious about the PVCs.  She says that she has a lot of stress going on at home.  She feels palpitations primarily at night when in bed, does not note during the day.  No chest pain, no significant exertional dyspnea.  No orthopnea/PND.  She has not been lightheaded recently.  She had an episode of vertigo (room spinning) several weeks ago, has had this in the past.  Of note, patient reports a long history of PVCs (present well before her CABG.   ECG (personally reviewed): NSR, LVH, PVCs   Labs (7/23): K 4.4, creatinine 1.28 Labs (8/23): K 3.5, creatinine 0.99 Labs (10/23): K 3.9, creatinine 0.99   PMH: 1.  Chronic systolic CHF: Ischemic cardiomyopathy.  - Echo (12/22): EF 45-50%, RV normal - Echo (5/23): EF 40-45%, RV normal  2. CAD: LHC (10/22) with subtotal mid LAD stenosis, complete LCx disease with severe  stenoses involving bifurcation of 4 branches of the LCx.  - CABG (8/23) with LIMA-LAD, SVG-OM1, SVG-OM2 3. Atrial fibrillation: Transient post-CABG.   4. Hyperlipidemia: Unable to take statins.  5. HTN 6. Type 2 diabetes 7. PVCs: Zio monitor (8/23) with 23% PVCs 8. Generalized anxiety  Review of Systems: All systems reviewed and negative except as per HPI.    Current Outpatient Medications  Medication Sig Dispense Refill   albuterol (VENTOLIN HFA) 108 (90 Base) MCG/ACT inhaler INHALE 2 PUFFS INTO THE LUNGS EVERY 6 HOURS AS NEEDED FOR  WHEEZING OR SHORTNESS OF BREATH 18 g 2   Alcohol Swabs (B-D SINGLE USE SWABS REGULAR) PADS Use to test blood sugar 3 times daily 100 each 3   Blood Glucose Monitoring Suppl (ONETOUCH VERIO FLEX SYSTEM) w/Device KIT Use to test blood sugars 1-2 times daily. 1 kit 0   clonazePAM (KLONOPIN) 1 MG tablet TAKE 1 TABLET(1 MG) BY MOUTH TWICE DAILY AS NEEDED FOR ANXIETY 60 tablet 3   clopidogrel (PLAVIX) 75 MG tablet Take 1 tablet (75 mg total) by mouth daily. 90 tablet 3   Dulaglutide (TRULICITY) 1.5 CV/8.9FY SOPN Inject 1.5 mg into the skin once a week. 13 mL 3   ezetimibe (ZETIA) 10 MG tablet TAKE 1 TABLET(10 MG) BY MOUTH DAILY 90 tablet 3   fluticasone (FLONASE) 50 MCG/ACT nasal spray Place 2 sprays into both nostrils daily.     gabapentin (NEURONTIN) 600 MG tablet TAKE 1 TABLET BY MOUTH EVERY MORNING AND AT NOON AND EVERY EVENING AND AT BEDTIME 120 tablet 3   HYDROcodone-acetaminophen (NORCO) 10-325 MG tablet Take 1 tablet by mouth every 6 (six) hours as needed. 120 tablet 0   JARDIANCE 10 MG TABS tablet Take 1 tablet (10 mg total) by mouth daily. 90 tablet 3   Lancets (ONETOUCH ULTRASOFT) lancets Use to test blood sugar 1-2 times daily. 100 each 12   melatonin 5 MG TABS Take 1 tablet (5 mg total) by mouth at bedtime as needed.  0   ondansetron (ZOFRAN-ODT) 4 MG disintegrating tablet Take 1 tablet (4 mg total) by mouth daily as needed. 20 tablet 2   pantoprazole (PROTONIX) 40 MG tablet Take 1 tablet (40 mg total) by mouth daily. 30 tablet 3   potassium chloride SA (KLOR-CON M) 20 MEQ tablet Take 1 tablet (20 mEq total) by mouth daily. 30 tablet 3   QUEtiapine (SEROQUEL) 100 MG tablet TAKE 1/2 TABLET BY MOUTH AT BEDTIME. 45 tablet 2   triamcinolone (KENALOG) 0.1 % paste Use as directed 1 Application in the mouth or throat daily as needed (mouth pain).     furosemide (LASIX) 20 MG tablet Take 20 mg by mouth daily as needed.     guaiFENesin (MUCINEX) 600 MG 12 hr tablet Take 600 mg by mouth daily.      loratadine (CLARITIN) 10 MG tablet Take 10 mg by mouth daily.     metoprolol succinate (TOPROL XL) 25 MG 24 hr tablet Take 1 tablet (25 mg total) by mouth 2 (two) times daily. 180 tablet 3   multivitamin-lutein (OCUVITE-LUTEIN) CAPS capsule Take 1 capsule by mouth daily.     Polyethyl Glycol-Propyl Glycol (SYSTANE) 0.4-0.3 % SOLN Place 1 drop into both eyes daily as needed (Dry eye).     No current facility-administered medications for this encounter.   Allergies  Allergen Reactions   Morphine And Related Itching    With high doses   Other Itching   Statins     Hx intolerance  to multiple statins   Penicillins Rash    Has patient had a PCN reaction causing immediate rash, facial/tongue/throat swelling, SOB or lightheadedness with hypotension: Yes Has patient had a PCN reaction causing severe rash involving mucus membranes or skin necrosis: No Has patient had a PCN reaction that required hospitalization: No Has patient had a PCN reaction occurring within the last 10 years: No If all of the above answers are "NO", then may proceed with Cephalosporin use.    Social History   Socioeconomic History   Marital status: Widowed    Spouse name: Not on file   Number of children: 1   Years of education: Not on file   Highest education level: 12th grade  Occupational History   Occupation: retired    Comment: work part time  Tobacco Use   Smoking status: Never   Smokeless tobacco: Never   Tobacco comments:    Never smoke 11/01/21  Vaping Use   Vaping Use: Never used  Substance and Sexual Activity   Alcohol use: Never   Drug use: No   Sexual activity: Not on file  Other Topics Concern   Not on file  Social History Narrative   Not on file   Social Determinants of Health   Financial Resource Strain: Low Risk  (01/16/2022)   Overall Financial Resource Strain (CARDIA)    Difficulty of Paying Living Expenses: Not very hard  Food Insecurity: Food Insecurity Present (01/16/2022)    Hunger Vital Sign    Worried About [place] in the Last Year: Sometimes true    Ran Out of Food in the Last Year: Sometimes true  Transportation Needs: No Transportation Needs (01/16/2022)   PRAPARE - Hydrologist (Medical): No    Lack of Transportation (Non-Medical): No  Physical Activity: Insufficiently Active (01/16/2022)   Exercise Vital Sign    Days of Exercise per Week: 3 days    Minutes of Exercise per Session: 30 min  Stress: Stress Concern Present (01/16/2022)   [place]    Feeling of Stress : To some extent  Social Connections: Moderately Integrated (01/16/2022)   Social Connection and Isolation Panel [NHANES]    Frequency of Communication with Friends and Family: More than three times a week    Frequency of Social Gatherings with Friends and Family: Twice a week    Attends Religious Services: More than 4 times per year    Active Member of [employer] or Organizations: Yes    Attends Archivist Meetings: More than 4 times per year    Marital Status: Widowed  Intimate Partner Violence: Not At Risk (01/07/2022)   Humiliation, Afraid, Rape, and Kick questionnaire    Fear of Current or Ex-Partner: No    Emotionally Abused: No    Physically Abused: No    Sexually Abused: No   Family History  Problem Relation Age of Onset   Diabetes Mother    Alzheimer's disease Mother    Lung cancer Father    Esophageal cancer Brother    Colon cancer Neg Hx    BP 120/70   Pulse 89   PHYSICAL EXAM: General: NAD Neck: No JVD, no thyromegaly or thyroid nodule.  Lungs: Clear to auscultation bilaterally with normal respiratory effort. CV: Nondisplaced PMI.  Heart regular S1/S2, no S3/S4, 1/6 SEM RUSB.  No peripheral edema.  No carotid bruit.  Normal pedal pulses.  Abdomen: Soft, nontender, no hepatosplenomegaly,  no distention.  Skin: Intact without lesions or rashes.   Neurologic: Alert and oriented x 3.  Psych: Normal affect. Extremities: No clubbing or cyanosis.  HEENT: Normal.   ASSESSMENT & PLAN: 1.  Chronic Systolic Heart Failure: Ischemic cardiomyopathy.  Echo 12/22 EF 45-50% => LHC with 2 vessel CAD w/ high grade mLAD disease.  Echo 5/23 with EF 40-45%.  She had CABG x 3 in 8/23.  Post-op echo in 11/23 with EF 45-50%, normal RV.  Not volume overloaded on exam, NYHA class I-II symptoms.  - With frequent PVCs (and not bradycardic), increase Toprol XL to 25 mg bid.  - Continue Jardiance 10 mg daily. BMET today.  - She did not tolerate spironolactone.  - At next appointment would consider initiation of Entresto if BP remains stable.  - She has Lasix for prn use, does not appear to need it regularly.  2. CAD: LHC (12/22) with 2V CAD w/ high grade mLAD disease=> pt declined CABG initially but had CABG with LIMA-LAD, SVG-OM1 and SVG-OM2 in 8/23.  No chest pain.  - Continue Plavix long-term.  - Continue ? blocker.  - Continue Zetia 10 mg daily, check lipids today.  3. Frequent PVCs: She has a long history of PVCs that pre-dated her CABG.  23% burden on Zio 8/23.  Today, noted to be bradycardic but actually HR in 80s with bigeminal PVCs.  PVCs at last appointment appeared quiescent, now seem more prevalent.  In our office, only a couple of PVCs on ECG (no longer bigeminal).  No syncope/presyncope, occasionally feels palpitations.  PVCs may arise from scar in LV from CAD.  Concerned that very frequent PVCs could worsen her cardiomyopathy.   - Repeat Zio monitor x 1 week.   - Increase Toprol XL to 25 mg bid.  - Depending Zio monitor results, consider addition of amiodarone vs PVC ablation.  - Check Mg, K, and TSH.  4. Hyperlipidemia: Unable to tolerate statins.  On Zetia.  Has been reluctant to take Repatha but now agreeable.  - Will again refer back to lipid clinic to get started on Repatha, not sure why referral did not go through before.   - Check lipids  today, goal LDL < 55.  5. Atrial fibrillation: Transient post-CABG.  Now off amiodarone. No recurrence has been seen.  - If recurs, will need anticoagulation.   She has followup in early January.   [name] 03/17/2022

## 2022-03-17 NOTE — Patient Instructions (Signed)
INCREASE Toprol to '25mg'$  Twice daily  Labs done today, your results will be available in MyChart, we will contact you for abnormal readings.  Your provider has recommended that  you wear a Zio Patch for 7 days.  This monitor will record your heart rhythm for our review.  IF you have any symptoms while wearing the monitor please press the button.  If you have any issues with the patch or you notice a red or orange light on it please call the company at [phone].  Once you remove the patch please mail it back to the company as soon as possible so we can get the results.  Your physician recommends that you schedule a follow-up appointment as scheduled   If you have any questions or concerns before your next appointment please send [place] a message through [place] or call our office at [phone].    TO LEAVE A MESSAGE FOR THE NURSE SELECT OPTION 2, PLEASE LEAVE A MESSAGE INCLUDING: YOUR NAME DATE OF BIRTH CALL BACK NUMBER REASON FOR CALL**this is important as we prioritize the call backs  YOU WILL RECEIVE A CALL BACK THE SAME DAY AS LONG AS YOU CALL BEFORE 4:00 PM  At the [facility], you and your health needs are our priority. As part of our continuing mission to provide you with exceptional heart care, we have created designated Provider Care Teams. These Care Teams include your primary Cardiologist (physician) and Advanced Practice Providers (APPs- Physician Assistants and Nurse Practitioners) who all work together to provide you with the care you need, when you need it.   You may see any of the following providers on your designated Care Team at your next follow up: Dr Glori Bickers Dr Loralie Champagne Dr. Roxana Hires, NP Lyda Jester, [facility] [place], [place] Forestine Na, NP Audry Riles, PharmD   Please be sure to bring in all your medications bottles to every appointment.

## 2022-03-18 ENCOUNTER — Other Ambulatory Visit (HOSPITAL_COMMUNITY)
Admission: RE | Admit: 2022-03-18 | Discharge: 2022-03-18 | Disposition: A | Discharge status: Home / Self Care | Payer: Medicare Other | Source: Ambulatory Visit | Attending: Cardiology | Admitting: Cardiology

## 2022-03-18 ENCOUNTER — Telehealth (HOSPITAL_COMMUNITY): Payer: Self-pay | Admitting: Cardiology

## 2022-03-18 ENCOUNTER — Other Ambulatory Visit (HOSPITAL_COMMUNITY): Payer: Self-pay | Admitting: Cardiology

## 2022-03-18 ENCOUNTER — Other Ambulatory Visit (HOSPITAL_COMMUNITY): Payer: Self-pay

## 2022-03-18 DIAGNOSIS — I5022 Chronic systolic (congestive) heart failure: Secondary | ICD-10-CM | POA: Diagnosis not present

## 2022-03-18 DIAGNOSIS — I493 Ventricular premature depolarization: Secondary | ICD-10-CM

## 2022-03-18 LAB — BASIC METABOLIC PANEL
Anion gap: 10 (ref 5–15)
BUN: 15 mg/dL (ref 8–23)
CO2: 23 mmol/L (ref 22–32)
Calcium: 9.2 mg/dL (ref 8.9–10.3)
Chloride: 104 mmol/L (ref 98–111)
Creatinine, Ser: 0.98 mg/dL (ref 0.44–1.00)
GFR, Estimated: >60 mL/min (ref >60)
Glucose, Bld: 119 mg/dL — ABNORMAL HIGH (ref 70–99)
Potassium: 3.9 mmol/L (ref 3.5–5.1)
Sodium: 137 mmol/L (ref 135–145)

## 2022-03-18 NOTE — Telephone Encounter (Signed)
Pt returned call to triage and is aware of results Reports she has been waiting to hear from lipid clinic x 2 months advised referral was placed 12/11 and office should reach out to schedule soon.   Will only have labs repeated at [facility] as she will not return to [facility] Orders placed and released for [facility] corp

## 2022-03-18 NOTE — Telephone Encounter (Signed)
-----   Message from Larey Dresser, MD sent at 03/17/2022  4:39 PM EST ----- LDL still too high. Triglycerides also high.  With known CAD, would make sure she gets lipid clinic appt for Repatha or Leqvio.  Would recommend start Vascepa 2 g bid for her high triglycerides.

## 2022-03-20 ENCOUNTER — Other Ambulatory Visit (HOSPITAL_COMMUNITY): Payer: Self-pay

## 2022-03-20 MED ORDER — METOPROLOL SUCCINATE ER 25 MG PO TB24: 25.0000 mg | ORAL_TABLET | Freq: Two times a day (BID) | ORAL | 3 refills | Status: DC

## 2022-03-24 ENCOUNTER — Encounter: Payer: Self-pay | Admitting: Family Medicine

## 2022-03-25 DIAGNOSIS — M5416 Radiculopathy, lumbar region: Secondary | ICD-10-CM | POA: Diagnosis not present

## 2022-03-29 DIAGNOSIS — U071 COVID-19: Secondary | ICD-10-CM | POA: Diagnosis not present

## 2022-03-29 DIAGNOSIS — Z20822 Contact with and (suspected) exposure to covid-19: Secondary | ICD-10-CM | POA: Diagnosis not present

## 2022-03-29 DIAGNOSIS — R630 Anorexia: Secondary | ICD-10-CM | POA: Diagnosis not present

## 2022-04-03 ENCOUNTER — Encounter (HOSPITAL_COMMUNITY): Payer: Medicare Other

## 2022-04-04 ENCOUNTER — Telehealth (HOSPITAL_COMMUNITY): Payer: Self-pay | Admitting: Cardiology

## 2022-04-04 MED ORDER — METOPROLOL SUCCINATE ER 25 MG PO TB24: 37.5000 mg | ORAL_TABLET | Freq: Two times a day (BID) | ORAL | 3 refills | Status: DC

## 2022-04-04 NOTE — Telephone Encounter (Signed)
Patient called.  Patient aware.  

## 2022-04-04 NOTE — Telephone Encounter (Signed)
-----   Message from Larey Dresser, MD sent at 04/03/2022  7:17 PM EST ----- PVCs still frequent though less than on last monitor.  Would continue to push beta blocker to 37.5 mg bid.  Unless she is feeling a lot of palpitations, will repeat Zio monitor in a few months.

## 2022-04-08 NOTE — Progress Notes (Signed)
ADVANCED HF CLINIC NOTE  Primary Care: Martinique, Betty G, MD CVTS: Dr. Prescott Gum HF Cardiologist: Dr. Aundra Dubin  HPI: [name] is a 72 y.o. female w/ chronic systolic heart failure, CAD, PVCs, type 2DM, HTN and HLD.    Referred for evaluation of palpitations 6/22. Cardiac monitor showed 24% PVC burden. Echo EF 55-60%, RV normal.    11/22 she was ordered to get stress test prior to surgical clearance for back surgery. This showed medium to large defects in the apical anterior/anterior septal distribution as well as basal inferior lateral distribution which was suggestive of scar and mild peri-infarct ischemia. She was subsequently referred for [facility] 12/22 which showed severe obstructive CAD with subtotal 99% mid-LAD and complex LCx disease with severe stenoses involving the bifurication of 4 branches with CT surgery consult recommended as LCx disease was not amenable to PCI. Repeat echo showed her EF was mildly reduced at 45-50% with mild LVH, normal RV function and trivial MR. She did meet with Dr. Kipp Brood on 03/22/2021 and by review of notes was very hesitant about proceeding with surgical intervention and was informed to make them aware with her ultimate decision about proceeding with CABG.    She went for a 2nd opinion and saw Dr. Prescott Gum 4/23 who also recommended CABG, but pt remained hesitant.    Presented to ED 08/22/21 w/ CC, nausea/vomiting and diarrhea. Notes outlined that she had CP and dyspnea but pt denies this. She was found to have an AKI and diagnosed w/ colitis. Also treated for CAP. HsTn 86>>80. Level not consistent with ACS. Felt most likely demand ischemia in the setting of dehydration (vomiting/diarrhea), AKI, acute infection. Echo repeated EF 40-45% w septal apical and inferior apical hypokinesis.    She finally had CABG in 8/23 with LIMA-LAD, SVG-OM1, SVG-OM2.  She had transient post-op atrial fibrillation.   Post-op echo in 11/23 showed EF 45-50% with normal RV.  She was  started on spironolactone but she could not tolerate this due to diarrhea that started on spironolactone and resolved once she had stopped it.   She had urgent work-in from [facility] 03/17/22 with HR in 40's. She felt palpitations, in our office, HR 89 on ECG with 2 PVCs. Toprol increased with frequent PVCs (and not bradycardia). Zio 7 day placed to quantify and showed PVCs down to 13.5%.   Today she returns for HF follow up. Overall feeling fair, she is tearful and frustrated at frequent medication changes. She works part time at [employer] and has no dyspnea with work duties. Denies palpitations, CP, dizziness, edema, or PND/Orthopnea. Appetite ok. No fever or chills. Weight at home 167 pounds. Taking all medications. Takes Lasix about once a week.  ECG (personally reviewed): NSR 81 bpms  Labs (7/23): K 4.4, creatinine 1.28 Labs (8/23): K 3.5, creatinine 0.99 Labs (10/23): K 3.9, creatinine 0.99 Labs (12/23): K 3.9, creatinine 0.98   PMH: 1.  Chronic systolic CHF: Ischemic cardiomyopathy.  - Echo (12/22): EF 45-50%, RV normal - Echo (5/23): EF 40-45%, RV normal  - Echo (11/23): EF 45-50%, normal RV 2. CAD: LHC (10/22) with subtotal mid LAD stenosis, complete LCx disease with severe stenoses involving bifurcation of 4 branches of the LCx.  - CABG (8/23) with LIMA-LAD, SVG-OM1, SVG-OM2 3. Atrial fibrillation: Transient post-CABG.   4. Hyperlipidemia: Unable to take statins.  5. HTN 6. Type 2 diabetes 7. PVCs: Zio monitor (8/23) with 23% PVCs - Zio 7 day (12/23): 13.5 % PVCs 8.  Generalized anxiety  Review of Systems: All systems reviewed and negative except as per HPI.   Current Outpatient Medications  Medication Sig Dispense Refill   albuterol (VENTOLIN HFA) 108 (90 Base) MCG/ACT inhaler INHALE 2 PUFFS INTO THE LUNGS EVERY 6 HOURS AS NEEDED FOR WHEEZING OR SHORTNESS OF BREATH 18 g 2   Alcohol Swabs (B-D SINGLE USE SWABS REGULAR) PADS Use to test blood sugar 3 times daily  100 each 3   Blood Glucose Monitoring Suppl (ONETOUCH VERIO FLEX SYSTEM) w/Device KIT Use to test blood sugars 1-2 times daily. 1 kit 0   clonazePAM (KLONOPIN) 1 MG tablet TAKE 1 TABLET(1 MG) BY MOUTH TWICE DAILY AS NEEDED FOR ANXIETY 60 tablet 3   clopidogrel (PLAVIX) 75 MG tablet Take 1 tablet (75 mg total) by mouth daily. 90 tablet 3   Dulaglutide (TRULICITY) 1.5 TY/6.0AY SOPN Inject 1.5 mg into the skin once a week. 13 mL 3   ezetimibe (ZETIA) 10 MG tablet TAKE 1 TABLET(10 MG) BY MOUTH DAILY 90 tablet 3   furosemide (LASIX) 20 MG tablet Take 20 mg by mouth daily as needed.     gabapentin (NEURONTIN) 600 MG tablet TAKE 1 TABLET BY MOUTH EVERY MORNING AND AT NOON AND EVERY EVENING AND AT BEDTIME 120 tablet 3   guaiFENesin (MUCINEX) 600 MG 12 hr tablet Take 600 mg by mouth daily.     HYDROcodone-acetaminophen (NORCO) 10-325 MG tablet Take 1 tablet by mouth every 6 (six) hours as needed. 120 tablet 0   JARDIANCE 10 MG TABS tablet Take 1 tablet (10 mg total) by mouth daily. 90 tablet 3   Lancets (ONETOUCH ULTRASOFT) lancets Use to test blood sugar 1-2 times daily. 100 each 12   loratadine (CLARITIN) 10 MG tablet Take 10 mg by mouth daily.     melatonin 5 MG TABS Take 1 tablet (5 mg total) by mouth at bedtime as needed.  0   metoprolol succinate (TOPROL XL) 25 MG 24 hr tablet Take 1.5 tablets (37.5 mg total) by mouth 2 (two) times daily. 240 tablet 3   multivitamin-lutein (OCUVITE-LUTEIN) CAPS capsule Take 1 capsule by mouth daily.     ondansetron (ZOFRAN-ODT) 4 MG disintegrating tablet Take 1 tablet (4 mg total) by mouth daily as needed. 20 tablet 2   pantoprazole (PROTONIX) 40 MG tablet Take 1 tablet (40 mg total) by mouth daily. 30 tablet 3   Polyethyl Glycol-Propyl Glycol (SYSTANE) 0.4-0.3 % SOLN Place 1 drop into both eyes daily as needed (Dry eye).     potassium chloride SA (KLOR-CON M) 20 MEQ tablet Take 1 tablet (20 mEq total) by mouth daily. 30 tablet 3   QUEtiapine (SEROQUEL) 100 MG  tablet TAKE 1/2 TABLET BY MOUTH AT BEDTIME. 45 tablet 2   triamcinolone (KENALOG) 0.1 % paste Use as directed 1 Application in the mouth or throat daily as needed (mouth pain).     fluticasone (FLONASE) 50 MCG/ACT nasal spray Place 2 sprays into both nostrils daily. (Patient not taking: Reported on 04/09/2022)     No current facility-administered medications for this encounter.   Allergies  Allergen Reactions   Morphine And Related Itching    With high doses   Other Itching   Statins     Hx intolerance to multiple statins   Penicillins Rash    Has patient had a PCN reaction causing immediate rash, facial/tongue/throat swelling, SOB or lightheadedness with hypotension: Yes Has patient had a PCN reaction causing severe rash involving mucus membranes or skin  necrosis: No Has patient had a PCN reaction that required hospitalization: No Has patient had a PCN reaction occurring within the last 10 years: No If all of the above answers are "NO", then may proceed with Cephalosporin use.    Social History   Socioeconomic History   Marital status: Widowed    Spouse name: Not on file   Number of children: 1   Years of education: Not on file   Highest education level: 12th grade  Occupational History   Occupation: retired    Comment: work part time  Tobacco Use   Smoking status: Never   Smokeless tobacco: Never   Tobacco comments:    Never smoke 11/01/21  Vaping Use   Vaping Use: Never used  Substance and Sexual Activity   Alcohol use: Never   Drug use: No   Sexual activity: Not on file  Other Topics Concern   Not on file  Social History Narrative   Not on file   Social Determinants of Health   Financial Resource Strain: Low Risk  (01/16/2022)   Overall Financial Resource Strain (CARDIA)    Difficulty of Paying Living Expenses: Not very hard  Food Insecurity: Food Insecurity Present (01/16/2022)   Hunger Vital Sign    Worried About [place] in the Last Year:  Sometimes true    Ran Out of Food in the Last Year: Sometimes true  Transportation Needs: No Transportation Needs (01/16/2022)   PRAPARE - Hydrologist (Medical): No    Lack of Transportation (Non-Medical): No  Physical Activity: Insufficiently Active (01/16/2022)   Exercise Vital Sign    Days of Exercise per Week: 3 days    Minutes of Exercise per Session: 30 min  Stress: Stress Concern Present (01/16/2022)   [place]    Feeling of Stress : To some extent  Social Connections: Moderately Integrated (01/16/2022)   Social Connection and Isolation Panel [NHANES]    Frequency of Communication with Friends and Family: More than three times a week    Frequency of Social Gatherings with Friends and Family: Twice a week    Attends Religious Services: More than 4 times per year    Active Member of [employer] or Organizations: Yes    Attends Archivist Meetings: More than 4 times per year    Marital Status: Widowed  Intimate Partner Violence: Not At Risk (01/07/2022)   Humiliation, Afraid, Rape, and Kick questionnaire    Fear of Current or Ex-Partner: No    Emotionally Abused: No    Physically Abused: No    Sexually Abused: No   Family History  Problem Relation Age of Onset   Diabetes Mother    Alzheimer's disease Mother    Lung cancer Father    Esophageal cancer Brother    Colon cancer Neg Hx    BP (!) 144/88   Pulse 80   Wt 78.5 kg (173 lb)   SpO2 98%   BMI 26.30 kg/m   PHYSICAL EXAM: General:  NAD. No resp difficulty, walked into clinic, tearful. HEENT: Normal Neck: Supple. No JVD. Carotids 2+ bilat; no bruits. No lymphadenopathy or thryomegaly appreciated. Cor: PMI nondisplaced. Regular rate & rhythm. No rubs, gallops or murmurs. Lungs: Clear Abdomen: Soft, nontender, nondistended. No hepatosplenomegaly. No bruits or masses. Good bowel sounds. Extremities: No cyanosis,  clubbing, rash, edema Neuro: Alert & oriented x 3, cranial nerves grossly intact. Moves all 4 extremities  w/o difficulty. Tearful and anxious  ASSESSMENT & PLAN: 1.  Chronic Systolic Heart Failure: Ischemic cardiomyopathy.  Echo 12/22 EF 45-50% => LHC with 2 vessel CAD w/ high grade mLAD disease.  Echo 5/23 with EF 40-45%.  She had CABG x 3 in 8/23.  Post-op echo in 11/23 with EF 45-50%, normal RV.  Not volume overloaded on exam, NYHA class I-II symptoms.  - Continue Toprol XL 37.5 mg bid. Will not increase today as she is afraid it will make her too groggy. - Continue Jardiance 10 mg daily. BMET today.  - She did not tolerate spironolactone.  - Hold off on adding Entresto, she is over-whelmed with medication changes.  - Continue Lasix PRN. 2. CAD: LHC (12/22) with 2V CAD w/ high grade mLAD disease=> pt declined CABG initially but had CABG with LIMA-LAD, SVG-OM1 and SVG-OM2 in 8/23.  No chest pain.  - Continue Plavix long-term.  - Continue ? blocker.  - Continue Zetia 10 mg daily. 3. Frequent PVCs: She has a long history of PVCs that pre-dated her CABG.  23% burden on Zio 8/23.  At last visit, only a couple of PVCs on ECG (no longer bigeminal). PVCs may arise from scar in LV from CAD.  Concerned that very frequent PVCs could worsen her cardiomyopathy.  Repeat Zio 7 day (12/23) showed PVCs frequent but improved from previous, now 13.5%.  Mag and K have been stable, ? Hyperthyroid driving ectopy (See below). - Continue Toprol XL 37.5 mg bid. As above, she does not want to increase over concerns for side effects. - Consider addition of amiodarone vs PVC ablation.  - Repeat Zio in 2-3 months. 4. Hyperlipidemia: Unable to tolerate statins.  On Zetia.  Has been reluctant to take Repatha but now agreeable.  - She has follow up with [facility] to discuss [place].  - LDL 122 on 12/23, goal LDL < 55. TGs 214. Cost is barrier for Vascepa. 5. Atrial fibrillation: Transient post-CABG.  Now off amiodarone.  No recurrence has been seen.  - If recurs, will need anticoagulation.  6. ? Hyperthyroidism: Recent TSH 0.044.  - Repeat TSH with free T3 and free T4. Needs PCP follow up, may need endocrinology referral.   Follow up in 2-3 months with APP (will place Zio to quantify PVCs).  Maricela Bo [facility] FNP-BC 04/09/2022

## 2022-04-09 ENCOUNTER — Ambulatory Visit (HOSPITAL_COMMUNITY)
Admission: RE | Admit: 2022-04-09 | Discharge: 2022-04-09 | Disposition: A | Discharge status: Home / Self Care | Payer: Medicare Other | Source: Ambulatory Visit | Attending: Family Medicine | Admitting: Family Medicine

## 2022-04-09 ENCOUNTER — Ambulatory Visit (HOSPITAL_COMMUNITY): Payer: Medicare Other | Admitting: Physical Therapy

## 2022-04-09 ENCOUNTER — Encounter (HOSPITAL_COMMUNITY): Payer: Self-pay

## 2022-04-09 VITALS — BP 144/88 | HR 80 | Wt 173.0 lb

## 2022-04-09 DIAGNOSIS — Z5941 Food insecurity: Secondary | ICD-10-CM | POA: Insufficient documentation

## 2022-04-09 DIAGNOSIS — I110 Hypertensive heart disease with heart failure: Secondary | ICD-10-CM | POA: Insufficient documentation

## 2022-04-09 DIAGNOSIS — E0590 Thyrotoxicosis, unspecified without thyrotoxic crisis or storm: Secondary | ICD-10-CM

## 2022-04-09 DIAGNOSIS — Z951 Presence of aortocoronary bypass graft: Secondary | ICD-10-CM | POA: Diagnosis not present

## 2022-04-09 DIAGNOSIS — Z7902 Long term (current) use of antithrombotics/antiplatelets: Secondary | ICD-10-CM | POA: Diagnosis not present

## 2022-04-09 DIAGNOSIS — I493 Ventricular premature depolarization: Secondary | ICD-10-CM | POA: Diagnosis not present

## 2022-04-09 DIAGNOSIS — I255 Ischemic cardiomyopathy: Secondary | ICD-10-CM | POA: Diagnosis not present

## 2022-04-09 DIAGNOSIS — I2510 Atherosclerotic heart disease of native coronary artery without angina pectoris: Secondary | ICD-10-CM

## 2022-04-09 DIAGNOSIS — I5022 Chronic systolic (congestive) heart failure: Secondary | ICD-10-CM

## 2022-04-09 DIAGNOSIS — E785 Hyperlipidemia, unspecified: Secondary | ICD-10-CM | POA: Diagnosis not present

## 2022-04-09 DIAGNOSIS — I4891 Unspecified atrial fibrillation: Secondary | ICD-10-CM | POA: Diagnosis not present

## 2022-04-09 DIAGNOSIS — Z79899 Other long term (current) drug therapy: Secondary | ICD-10-CM | POA: Insufficient documentation

## 2022-04-09 LAB — TSH: TSH: 0.038 u[IU]/mL — ABNORMAL LOW (ref 0.350–4.500)

## 2022-04-09 NOTE — Patient Instructions (Addendum)
Please try Afrin OTC to help with runny nose x 3 days  Please be sure to schedule follow up with Dr Martinique (PCP)  Labs today We will only contact you if something comes back abnormal or we need to make some changes. Otherwise no news is good news!  Your physician recommends that you schedule a follow-up appointment in: 3 months with Dr Aundra Dubin -please call our office in February to schedule a April appointment  Do the following things EVERYDAY: Weigh yourself in the morning before breakfast. Write it down and keep it in a log. Take your medicines as prescribed Eat low salt foods--Limit salt (sodium) to 2000 mg per day.  Stay as active as you can everyday Limit all fluids for the day to less than 2 liters  At the [facility], you and your health needs are our priority. As part of our continuing mission to provide you with exceptional heart care, we have created designated Provider Care Teams. These Care Teams include your primary Cardiologist (physician) and Advanced Practice Providers (APPs- Physician Assistants and Nurse Practitioners) who all work together to provide you with the care you need, when you need it.   You may see any of the following providers on your designated Care Team at your next follow up: Dr Glori Bickers Dr Loralie Champagne Dr. Roxana Hires, NP Lyda Jester, [place] [facility] [place], [place] Forestine Na, NP Audry Riles, PharmD   Please be sure to bring in all your medications bottles to every appointment.   If you have any questions or concerns before your next appointment please send [place] a message through [place] or call our office at [phone].    TO LEAVE A MESSAGE FOR THE NURSE SELECT OPTION 2, PLEASE LEAVE A MESSAGE INCLUDING: YOUR NAME DATE OF BIRTH CALL BACK NUMBER REASON FOR CALL**this is important as we prioritize the call backs  YOU WILL RECEIVE A CALL BACK THE SAME DAY AS LONG AS YOU CALL BEFORE 4:00  PM

## 2022-04-10 LAB — T3, FREE: T3, Free: 3.7 pg/mL (ref 2.0–4.4)

## 2022-04-14 ENCOUNTER — Telehealth (HOSPITAL_COMMUNITY): Payer: Self-pay

## 2022-04-14 DIAGNOSIS — Z85828 Personal history of other malignant neoplasm of skin: Secondary | ICD-10-CM | POA: Diagnosis not present

## 2022-04-14 NOTE — Telephone Encounter (Signed)
Patient aware of results.

## 2022-04-18 ENCOUNTER — Other Ambulatory Visit: Payer: Self-pay

## 2022-04-18 ENCOUNTER — Ambulatory Visit (HOSPITAL_COMMUNITY): Payer: Medicare Other | Attending: Cardiology

## 2022-04-18 DIAGNOSIS — R262 Difficulty in walking, not elsewhere classified: Secondary | ICD-10-CM | POA: Diagnosis not present

## 2022-04-18 DIAGNOSIS — R29898 Other symptoms and signs involving the musculoskeletal system: Secondary | ICD-10-CM | POA: Diagnosis not present

## 2022-04-18 DIAGNOSIS — I5022 Chronic systolic (congestive) heart failure: Secondary | ICD-10-CM | POA: Insufficient documentation

## 2022-04-18 NOTE — Therapy (Signed)
OUTPATIENT PHYSICAL THERAPY LOWER EXTREMITY EVALUATION   Patient Name: [name] MRN: [id number] DOB:1950-07-13, 72 y.o., female Today's Date: 04/18/2022  END OF SESSION:  PT End of Session - 04/18/22 1446     Visit Number 1    Number of Visits 6    Date for PT Re-Evaluation 05/30/22    Authorization Type BCBS medicare    Progress Note Due on Visit 10    PT Start Time 0235    PT Stop Time 0315    PT Time Calculation (min) 40 min    Activity Tolerance Patient limited by fatigue;Patient limited by pain    Behavior During Therapy WFL for tasks assessed/performed             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[place])    Anxiety    Arthritis    Diabetes mellitus without complication (HCC)    GERD (gastroesophageal reflux disease)    Heart murmur    Hyperlipidemia    Hypertension    Neuromuscular disorder (HCC)    Palpitations    Pneumonia    PONV (postoperative nausea and vomiting)    PTSD (post-traumatic stress disorder)    Ringing of ears, left    Past Surgical History:  Procedure Laterality Date   ABDOMINAL HYSTERECTOMY     partial   ACHILLES TENDON SURGERY Left    APPENDECTOMY     BACK SURGERY     BLADDER SUSPENSION     BREAST SURGERY Right 2003   milk duct removed   CARDIAC CATHETERIZATION     CHOLECYSTECTOMY     CORONARY ARTERY BYPASS GRAFT N/A 11/14/2021   Procedure: CORONARY ARTERY BYPASS GRAFTING (CABG) TIMES THREE USING LIMA AND CRYO VEIN.;  Surgeon: Dahlia Byes, MD;  Location: [facility];  Service: Open Heart Surgery;  Laterality: N/A;   FRACTURE SURGERY     collar bone   LEFT HEART CATH AND CORONARY ANGIOGRAPHY N/A 03/08/2021   Procedure: LEFT HEART CATH AND CORONARY ANGIOGRAPHY;  Surgeon: Martinique, Peter M, MD;  Location: [facility];  Service: Cardiovascular;  Laterality: N/A;   NOSE SURGERY     x 2    OTHER SURGICAL HISTORY     Breast Duct removed   PARTIAL HYSTERECTOMY     RECTOCELE REPAIR     TEE WITHOUT  CARDIOVERSION N/A 11/14/2021   Procedure: TRANSESOPHAGEAL ECHOCARDIOGRAM (TEE);  Surgeon: Dahlia Byes, MD;  Location: [facility];  Service: Open Heart Surgery;  Laterality: N/A;   TONSILLECTOMY     VASCULAR SURGERY Right    right leg   VEIN SURGERY Right    leg   Patient Active Problem List   Diagnosis Date Noted   Surgical follow-up care 01/06/2022   Encounter for postoperative wound check 01/06/2022   Atrial fibrillation ([place]) 11/21/2021   S/P CABG x 3 11/14/2021   Chest pain at rest 11/06/2021   Statin myopathy 10/18/2021   T[place] Must Quervain's disease (radial styloid tenosynovitis) 09/26/2021   NSTEMI (non-ST elevated myocardial infarction) ([place]) 08/22/2021   Nausea vomiting and diarrhea 08/22/2021   AKI (acute kidney injury) ([place]) 08/22/2021   CAP (community acquired pneumonia) 08/22/2021   Pneumonia 55/97/4163   Chronic systolic CHF (congestive heart failure) (HCC)    Abdominal cramping    History of atherosclerotic heart disease 07/15/2021   Coronary artery disease involving native coronary artery of native heart without angina pectoris 04/19/2021   Abnormal nuclear cardiac imaging test 03/08/2021   Diabetic peripheral  neuropathy ([place]) 12/06/2020   Anxiety disorder 01/27/2020   HNP (herniated nucleus pulposus), lumbar 01/13/2020   PTSD (post-traumatic stress disorder)    Polyneuropathy associated with underlying disease ([place]) 07/26/2019   Allergic rhinitis 07/26/2019   Atherosclerosis of aorta ([place]) 04/04/2019   Myalgia due to statin 11/18/2017   Depression, major, recurrent, in partial remission ([place]) 10/16/2017   Insomnia 10/16/2017   Chronic pain syndrome 09/07/2017   Spinal stenosis of lumbar region 07/08/2017   DOE (dyspnea on exertion) 12/16/2016   Family history of early CAD 12/16/2016   Other chest pain 12/16/2016   Arthritis of left acromioclavicular joint 08/13/2016   Left shoulder pain 05/12/2016   Cervical spondylosis without myelopathy 05/12/2016   Rotator cuff  syndrome, left 05/12/2016   Restless leg syndrome 05/12/2016   Primary osteoarthritis, left hand 05/12/2016   MELENA 10/18/2008   GASTROPARESIS 05/30/2008   Type 2 diabetes mellitus with neurological complications ([place]) 86/76/1950   HYPERCHOLESTEROLEMIA 05/04/2008   HYPOKALEMIA 05/04/2008   GERD 05/04/2008   Constipation 05/04/2008   HEMATEMESIS 05/04/2008   Essential hypertension 05/04/2008    PCP: Betty Martinique, MD  REFERRING PROVIDER: Larey Dresser, [place]  REFERRING DIAG:  Diagnosis  I50.22 (ICD-10-CM) - Chronic systolic CHF (congestive heart failure) ([place])    THERAPY DIAG:  Bilateral leg weakness - Plan: PT plan of care cert/re-cert  Difficulty in walking, not elsewhere classified - Plan: PT plan of care cert/re-cert  Other symptoms and signs involving the musculoskeletal system - Plan: PT plan of care cert/re-cert  Rationale for Evaluation and Treatment: Rehabilitation  ONSET DATE: 11/14/2021  SUBJECTIVE:   SUBJECTIVE STATEMENT: Had open heart surgery August 10th; legs are weak; not strong enough to have cardiac rehab so trying to get legs stronger to be able to do cardiac rehab.  She states after her surgery she had burning in the groin area and numbness in right hand; has a hard time rolling in bed. Left leg is weaker and gives her more trouble than right.    PERTINENT HISTORY: DM, Neuropathy in toes Dr. Saintclair Halsted did a lumbar fusion 3 years ago (had 2 decompressions prior)   PAIN:  Are you having pain? Yes: NPRS scale: 7-10/10 Pain location: legs; left thigh is most painful Pain description: really sore Aggravating factors: standing, walking, but sometimes no reason and can have increased pain Relieving factors: pain medication; lying in fetal position  PRECAUTIONS: Fall  WEIGHT BEARING RESTRICTIONS: No  FALLS:  Has patient fallen in last 6 months? No  LIVING ENVIRONMENT: Lives with: lives alone Lives in:  House/apartment Stairs: No Has following equipment at home: Single point cane, Walker - 2 wheeled, and bed side commode  OCCUPATION: works part time 4 hours a week  PLOF: Independent  PATIENT GOALS: walk upright; be able to do cardiac rehab  NEXT MD VISIT: 3 months  OBJECTIVE:   DIAGNOSTIC FINDINGS: (2022) IMPRESSION: 1. Unchanged multilevel lumbar spondylosis as described above. Unchanged moderate spinal canal and bilateral lateral recess stenosis at L4-L5. 2. Unchanged left subarticular disc protrusion at L5-S1 posteriorly displacing the descending left S1 nerve root without impingement. 3. Prior L2-L3 PLIF with unchanged right subarticular osseous ridging encroaching on the descending right L3 nerve root without impingement. No residual stenosis.    PATIENT SURVEYS:  FOTO 47  COGNITION: Overall cognitive status: Within functional limits for tasks assessed     SENSATION: Neuropathy in feet  EDEMA: none noted   POSTURE: forward head, decreased lumbar lordosis, and flexed  trunk    LOWER EXTREMITY MMT:  MMT Right eval Left eval  Hip flexion 4 4  Hip extension    Hip abduction    Hip adduction    Hip internal rotation    Hip external rotation    Knee flexion (sitting isometric) *patient does not tolerate prone 4 4-  Knee extension 4+ 4+  Ankle dorsiflexion 4+ 4-  Ankle plantarflexion    Ankle inversion    Ankle eversion     (Blank rows = not tested)  FUNCTIONAL TESTS:  5 times sit to stand: 39.64 sec using hands to assist 2 minute walk test: 156 ft   GAIT: Distance walked: 156 ft Assistive device utilized: None Level of assistance: Modified independence Comments: slow ; flexed trunk; left leg internally rotates   TODAY'S TREATMENT:                                                                                                                              DATE: physical therapy evaluation and HEP instruction    PATIENT EDUCATION:  Education  details: Patient educated on exam findings, POC, scope of PT, HEP, and aquatic exercise; purchase of rollator. Person educated: Patient Education method: Explanation, Demonstration, and Handouts Education comprehension: verbalized understanding, returned demonstration, verbal cues required, and tactile cues required   HOME EXERCISE PROGRAM: Access Code: [email or web address] URL: [email or web address] Date: 04/18/2022 Prepared by: AP - Rehab  Exercises - Sit to Stand with Armchair  - 2 x daily - 7 x weekly - 1 sets - 5 reps - Supine Lower Trunk Rotation  - 2 x daily - 7 x weekly - 1 sets - 10 reps - Supine Bridge  - 2 x daily - 7 x weekly - 2 sets - 5 reps  ASSESSMENT:  CLINICAL IMPRESSION: Patient is a 72 y.o. lady who was seen today for physical therapy evaluation and treatment for leg weakness. Patient presents to physical therapy with complaint of leg weakness; pain and overall decreased functional mobility. Patient demonstrates muscle weakness, reduced ROM, and fascial restrictions which are likely contributing to symptoms of pain and are negatively impacting patient ability to perform ADLs and functional mobility tasks. Patient will benefit from skilled physical therapy services to address these deficits to reduce pain and improve level of function with ADLs and functional mobility tasks.   OBJECTIVE IMPAIRMENTS: Abnormal gait, cardiopulmonary status limiting activity, decreased activity tolerance, decreased endurance, decreased knowledge of use of DME, decreased mobility, difficulty walking, decreased ROM, decreased strength, hypomobility, increased fascial restrictions, impaired perceived functional ability, impaired flexibility, improper body mechanics, postural dysfunction, and pain.   ACTIVITY LIMITATIONS: carrying, lifting, bending, sitting, standing, squatting, sleeping, stairs, transfers, bed mobility, reach over head, hygiene/grooming, locomotion level, and caring for  others  PARTICIPATION LIMITATIONS: meal prep, cleaning, laundry, driving, shopping, community activity, and occupation  PERSONAL FACTORS: Fitness, Past/current experiences, and 1-2 comorbidities: DM, HBP  are also affecting patient's functional outcome.   REHAB POTENTIAL: Good  CLINICAL DECISION MAKING: Evolving/moderate complexity  EVALUATION COMPLEXITY: Moderate   GOALS: Goals reviewed with patient? No  SHORT TERM GOALS: Target date: 05/09/2022 patient will be independent with initial HEP  Baseline: Goal status: INITIAL  2.  Patient will self report 30% improvement to improve tolerance for functional activity  Baseline:  Goal status: INITIAL  LONG TERM GOALS: Target date: 05/30/2022  Patient will be independent in self management strategies to improve quality of life and functional outcomes.  Baseline:  Goal status: INITIAL  2.  Patient will improve FOTO score to predicted value  Baseline: 47 Goal status: INITIAL  3.  Patient will self report 50% improvement to improve tolerance for functional activity  Baseline:  Goal status: INITIAL  4.  Patient will increase distance on 2MWT to 224 ft  to demonstrate improved functional mobility walking household and community distances.   Baseline: 156 ft Goal status: INITIAL  5.  Patient will improve 5 times sit to stand score from 39.64 sec to 25 sec or less to demonstrate improved functional mobility and increased lower extremity strength.  Baseline:  Goal status: INITIAL    PLAN:  PT FREQUENCY: 1x/week  PT DURATION: 6 weeks  PLANNED INTERVENTIONS: Therapeutic exercises, Therapeutic activity, Neuromuscular re-education, Balance training, Gait training, Patient/Family education, Joint manipulation, Joint mobilization, Stair training, Orthotic/Fit training, DME instructions, Aquatic Therapy, Dry Needling, Electrical stimulation, Spinal manipulation, Spinal mobilization, Cryotherapy, Moist heat, Compression bandaging,  scar mobilization, Splintting, Taping, Traction, Ultrasound, Ionotophoresis '4mg'$ /ml Dexamethasone, and Manual therapy   PLAN FOR NEXT SESSION: Review HEP and goals; core and lower extremity strengthening, gait, balance   3:38 PM, 04/18/22 Turki Tapanes Small Adain Geurin MPT [facility]  [phone] [phone]

## 2022-04-21 ENCOUNTER — Encounter: Payer: Self-pay | Admitting: Family Medicine

## 2022-04-23 ENCOUNTER — Telehealth: Payer: Self-pay | Admitting: Pharmacist

## 2022-04-23 ENCOUNTER — Ambulatory Visit: Payer: Medicare Other | Attending: Cardiovascular Disease | Admitting: Pharmacist

## 2022-04-23 ENCOUNTER — Encounter: Payer: Self-pay | Admitting: Pharmacist

## 2022-04-23 DIAGNOSIS — I2510 Atherosclerotic heart disease of native coronary artery without angina pectoris: Secondary | ICD-10-CM

## 2022-04-23 DIAGNOSIS — I214 Non-ST elevation (NSTEMI) myocardial infarction: Secondary | ICD-10-CM | POA: Diagnosis not present

## 2022-04-23 DIAGNOSIS — I700 Atherosclerosis of aorta: Secondary | ICD-10-CM

## 2022-04-23 MED ORDER — REPATHA SURECLICK 140 MG/ML ~~LOC~~ SOAJ: 1.0000 mL | SUBCUTANEOUS | 3 refills | Status: DC

## 2022-04-23 NOTE — Telephone Encounter (Signed)
Created updated PA for Repatha.  Key: [id number].  [employer] grant Activated:  CARD NO. [id number]   CARD STATUS Active   BIN [id number]   PCN [id number]   PC GROUP [id number]

## 2022-04-23 NOTE — Patient Instructions (Signed)
It was nice meeting you today  We would like your LDL (bad cholesterol) to be less than 55 to reduce future heart blockages  I have sent a prescription for Repatha to your pharmacy and enrolled you in our copay program  Once you start the medication, we will recheck your cholesterol in 2-3 months. You can get this done at [facility] or at Dr Claris Gladden office  Please message or call with any questions  Karren Cobble, PharmD, [place], [place], [place], [place], [place], [address] Phone: [phone] Fax: [phone]

## 2022-04-23 NOTE — Progress Notes (Signed)
Patient ID: [name]                 DOB: October 17, 1950                    MRN: [id number]     HPI: [name] is a 72 y.o. female patient referred to lipid clinic by Dr [name]. PMH is significant for HTN, CAD, NSTEMI, CHF, T2DM, CABG, and anxiety.   Patient presents today upset and unclear why she is here. Had been seen for virtual visit in October and was started on Repatha. Did not pick up due to cost.   Patient reports she feels poorly which she blames on quantity of medications. Is under stress and is frustrated. Continues to work part time also.  Current Medications:  Ezetimibe '10mg'$  daily  Intolerances:  Atorvastatin   Risk Factors:  CAD HLD CHF T2DM Hx of MI  LDL goal: <55  Labs: TC 214, Trigs 214, HDL 49, LDL 122 (03/17/22 on Zetia)  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HCC)    Anxiety    Arthritis    Diabetes mellitus without complication (HCC)    GERD (gastroesophageal reflux disease)    Heart murmur    Hyperlipidemia    Hypertension    Neuromuscular disorder (HCC)    Palpitations    Pneumonia    PONV (postoperative nausea and vomiting)    PTSD (post-traumatic stress disorder)    Ringing of ears, left     Current Outpatient Medications on File Prior to Visit  Medication Sig Dispense Refill   albuterol (VENTOLIN HFA) 108 (90 Base) MCG/ACT inhaler INHALE 2 PUFFS INTO THE LUNGS EVERY 6 HOURS AS NEEDED FOR WHEEZING OR SHORTNESS OF BREATH 18 g 2   Alcohol Swabs (B-D SINGLE USE SWABS REGULAR) PADS Use to test blood sugar 3 times daily 100 each 3   Blood Glucose Monitoring Suppl (ONETOUCH VERIO FLEX SYSTEM) w/Device KIT Use to test blood sugars 1-2 times daily. 1 kit 0   clonazePAM (KLONOPIN) 1 MG tablet TAKE 1 TABLET(1 MG) BY MOUTH TWICE DAILY AS NEEDED FOR ANXIETY 60 tablet 3   clopidogrel (PLAVIX) 75 MG tablet Take 1 tablet (75 mg total) by mouth daily. 90 tablet 3   Dulaglutide (TRULICITY) 1.5 UQ/3.3HL SOPN Inject 1.5 mg  into the skin once a week. 13 mL 3   ezetimibe (ZETIA) 10 MG tablet TAKE 1 TABLET(10 MG) BY MOUTH DAILY 90 tablet 3   fluticasone (FLONASE) 50 MCG/ACT nasal spray Place 2 sprays into both nostrils daily. (Patient not taking: Reported on 04/09/2022)     furosemide (LASIX) 20 MG tablet Take 20 mg by mouth daily as needed.     gabapentin (NEURONTIN) 600 MG tablet TAKE 1 TABLET BY MOUTH EVERY MORNING AND AT NOON AND EVERY EVENING AND AT BEDTIME 120 tablet 3   guaiFENesin (MUCINEX) 600 MG 12 hr tablet Take 600 mg by mouth daily.     HYDROcodone-acetaminophen (NORCO) 10-325 MG tablet Take 1 tablet by mouth every 6 (six) hours as needed. 120 tablet 0   JARDIANCE 10 MG TABS tablet Take 1 tablet (10 mg total) by mouth daily. 90 tablet 3   Lancets (ONETOUCH ULTRASOFT) lancets Use to test blood sugar 1-2 times daily. 100 each 12   loratadine (CLARITIN) 10 MG tablet Take 10 mg by mouth daily.     melatonin 5 MG TABS Take 1 tablet (5 mg total) by mouth at bedtime  as needed.  0   metoprolol succinate (TOPROL XL) 25 MG 24 hr tablet Take 1.5 tablets (37.5 mg total) by mouth 2 (two) times daily. 240 tablet 3   multivitamin-lutein (OCUVITE-LUTEIN) CAPS capsule Take 1 capsule by mouth daily.     ondansetron (ZOFRAN-ODT) 4 MG disintegrating tablet Take 1 tablet (4 mg total) by mouth daily as needed. 20 tablet 2   pantoprazole (PROTONIX) 40 MG tablet Take 1 tablet (40 mg total) by mouth daily. 30 tablet 3   Polyethyl Glycol-Propyl Glycol (SYSTANE) 0.4-0.3 % SOLN Place 1 drop into both eyes daily as needed (Dry eye).     potassium chloride SA (KLOR-CON M) 20 MEQ tablet Take 1 tablet (20 mEq total) by mouth daily. 30 tablet 3   QUEtiapine (SEROQUEL) 100 MG tablet TAKE 1/2 TABLET BY MOUTH AT BEDTIME. 45 tablet 2   triamcinolone (KENALOG) 0.1 % paste Use as directed 1 Application in the mouth or throat daily as needed (mouth pain).     No current facility-administered medications on file prior to visit.    Allergies   Allergen Reactions   Morphine And Related Itching    With high doses   Other Itching   Statins     Hx intolerance to multiple statins   Penicillins Rash    Has patient had a PCN reaction causing immediate rash, facial/tongue/throat swelling, SOB or lightheadedness with hypotension: Yes Has patient had a PCN reaction causing severe rash involving mucus membranes or skin necrosis: No Has patient had a PCN reaction that required hospitalization: No Has patient had a PCN reaction occurring within the last 10 years: No If all of the above answers are "NO", then may proceed with Cephalosporin use.     Assessment/Plan:  1. Hyperlipidemia - Patient's last LDL 122 which is above goal of <55. Aggressive goal due to history of CAD, CABG, DM, and CHF. Patient is very frustrated regarding medications and changes and was resistant to starting Repatha. Concerned more over possible adverse effects than price. Enrolled patient in grant program for zero dollar copay.  After patient explained all her frustrations mood was much calmer.  Explained mechanism of action of Repatha and how to administer. Used demo pen and showed patient injection sites and discussed possible adverse effects. Patient voiced understanding and is willing to start.  New Rx sent to pharmacy.  Continue ezetimibe '10mg'$  daily Start Repatha '140mg'$  q 2 weeks Recheck lipid panel in 2-3 months  Karren Cobble, PharmD, [place], [place], [place], [place], [place], [address] Phone: [phone], Fax: [phone]

## 2022-04-24 ENCOUNTER — Encounter: Payer: Self-pay | Admitting: Pharmacist

## 2022-04-24 ENCOUNTER — Encounter: Payer: Self-pay | Admitting: Family Medicine

## 2022-04-24 DIAGNOSIS — I700 Atherosclerosis of aorta: Secondary | ICD-10-CM

## 2022-04-24 DIAGNOSIS — I214 Non-ST elevation (NSTEMI) myocardial infarction: Secondary | ICD-10-CM

## 2022-04-24 DIAGNOSIS — I2510 Atherosclerotic heart disease of native coronary artery without angina pectoris: Secondary | ICD-10-CM

## 2022-04-25 MED ORDER — REPATHA SURECLICK 140 MG/ML ~~LOC~~ SOAJ: 1.0000 mL | SUBCUTANEOUS | 3 refills | Status: DC

## 2022-04-25 NOTE — Addendum Note (Signed)
Addended by: Rollen Sox on: 04/25/2022 02:47 PM   Modules accepted: Orders

## 2022-04-26 ENCOUNTER — Encounter: Payer: Self-pay | Admitting: Pharmacist

## 2022-04-27 ENCOUNTER — Encounter: Payer: Self-pay | Admitting: Family Medicine

## 2022-04-28 ENCOUNTER — Telehealth (HOSPITAL_COMMUNITY): Payer: Self-pay | Admitting: Pharmacy Technician

## 2022-04-28 ENCOUNTER — Encounter: Payer: Self-pay | Admitting: Pharmacist

## 2022-04-28 ENCOUNTER — Telehealth (HOSPITAL_COMMUNITY): Payer: Self-pay

## 2022-04-28 MED ORDER — PANTOPRAZOLE SODIUM 40 MG PO TBEC: 40.0000 mg | DELAYED_RELEASE_TABLET | Freq: Every day | ORAL | 3 refills | Status: DC

## 2022-04-28 NOTE — Telephone Encounter (Signed)
Advanced Heart Failure Patient Advocate Encounter  Patient left message needing help with renewing [place]. The current grant does not expire until May and has ample funds on it. Called and spoke with the patient. Forwarded original Designer, television/film set. The patient did state she was getting her Jardiance RX transferred to [facility] since [employer] did not have it in stock. [facility] will transfer the RX. No further action needed at this time.  Charlann Boxer, CPhT

## 2022-04-28 NOTE — Telephone Encounter (Signed)
Patient left voice mail stating her [place] had expired and she would like to set up another one if possible. She is also out of Jardiance and states she is having trouble finding a pharmacy that has it. Can you help?

## 2022-05-01 ENCOUNTER — Ambulatory Visit (HOSPITAL_COMMUNITY): Payer: Medicare Other

## 2022-05-01 DIAGNOSIS — R29898 Other symptoms and signs involving the musculoskeletal system: Secondary | ICD-10-CM | POA: Diagnosis not present

## 2022-05-01 DIAGNOSIS — R262 Difficulty in walking, not elsewhere classified: Secondary | ICD-10-CM | POA: Diagnosis not present

## 2022-05-01 DIAGNOSIS — I5022 Chronic systolic (congestive) heart failure: Secondary | ICD-10-CM | POA: Diagnosis not present

## 2022-05-01 NOTE — Therapy (Signed)
OUTPATIENT PHYSICAL THERAPY LOWER EXTREMITY TREATMENT   Patient Name: [name] MRN: [id number] DOB:1950/11/09, 72 y.o., female Today's Date: 05/01/2022  END OF SESSION:  PT End of Session - 05/01/22 1435     Visit Number 2    Number of Visits 6    Date for PT Re-Evaluation 05/30/22    Authorization Type BCBS medicare    Progress Note Due on Visit 10    PT Start Time 0235    PT Stop Time 0315    PT Time Calculation (min) 40 min    Activity Tolerance Patient limited by fatigue;Patient limited by pain    Behavior During Therapy WFL for tasks assessed/performed             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[place])    Anxiety    Arthritis    Diabetes mellitus without complication (HCC)    GERD (gastroesophageal reflux disease)    Heart murmur    Hyperlipidemia    Hypertension    Neuromuscular disorder (HCC)    Palpitations    Pneumonia    PONV (postoperative nausea and vomiting)    PTSD (post-traumatic stress disorder)    Ringing of ears, left    Past Surgical History:  Procedure Laterality Date   ABDOMINAL HYSTERECTOMY     partial   ACHILLES TENDON SURGERY Left    APPENDECTOMY     BACK SURGERY     BLADDER SUSPENSION     BREAST SURGERY Right 2003   milk duct removed   CARDIAC CATHETERIZATION     CHOLECYSTECTOMY     CORONARY ARTERY BYPASS GRAFT N/A 11/14/2021   Procedure: CORONARY ARTERY BYPASS GRAFTING (CABG) TIMES THREE USING LIMA AND CRYO VEIN.;  Surgeon: Dahlia Byes, MD;  Location: [facility];  Service: Open Heart Surgery;  Laterality: N/A;   FRACTURE SURGERY     collar bone   LEFT HEART CATH AND CORONARY ANGIOGRAPHY N/A 03/08/2021   Procedure: LEFT HEART CATH AND CORONARY ANGIOGRAPHY;  Surgeon: Martinique, Peter M, MD;  Location: [facility];  Service: Cardiovascular;  Laterality: N/A;   NOSE SURGERY     x 2    OTHER SURGICAL HISTORY     Breast Duct removed   PARTIAL HYSTERECTOMY     RECTOCELE REPAIR     TEE WITHOUT  CARDIOVERSION N/A 11/14/2021   Procedure: TRANSESOPHAGEAL ECHOCARDIOGRAM (TEE);  Surgeon: Dahlia Byes, MD;  Location: [facility];  Service: Open Heart Surgery;  Laterality: N/A;   TONSILLECTOMY     VASCULAR SURGERY Right    right leg   VEIN SURGERY Right    leg   Patient Active Problem List   Diagnosis Date Noted   Surgical follow-up care 01/06/2022   Encounter for postoperative wound check 01/06/2022   Atrial fibrillation ([place]) 11/21/2021   S/P CABG x 3 11/14/2021   Chest pain at rest 11/06/2021   Myopathy, unspecified 10/18/2021   T[place] Must Quervain's disease (radial styloid tenosynovitis) 09/26/2021   NSTEMI (non-ST elevated myocardial infarction) ([place]) 08/22/2021   Nausea vomiting and diarrhea 08/22/2021   AKI (acute kidney injury) ([place]) 08/22/2021   CAP (community acquired pneumonia) 08/22/2021   Pneumonia 77/41/2878   Chronic systolic CHF (congestive heart failure) (HCC)    Abdominal cramping    History of atherosclerotic heart disease 07/15/2021   Coronary artery disease involving native coronary artery of native heart without angina pectoris 04/19/2021   Abnormal nuclear cardiac imaging test 03/08/2021   Diabetic peripheral  neuropathy ([place]) 12/06/2020   Anxiety disorder 01/27/2020   HNP (herniated nucleus pulposus), lumbar 01/13/2020   PTSD (post-traumatic stress disorder)    Polyneuropathy associated with underlying disease ([place]) 07/26/2019   Allergic rhinitis 07/26/2019   Atherosclerosis of aorta ([place]) 04/04/2019   Myalgia due to statin 11/18/2017   Depression, major, recurrent, in partial remission ([place]) 10/16/2017   Insomnia 10/16/2017   Chronic pain syndrome 09/07/2017   Spinal stenosis of lumbar region 07/08/2017   DOE (dyspnea on exertion) 12/16/2016   Family history of early CAD 12/16/2016   Other chest pain 12/16/2016   Arthritis of left acromioclavicular joint 08/13/2016   Left shoulder pain 05/12/2016   Cervical spondylosis without myelopathy 05/12/2016    Rotator cuff syndrome, left 05/12/2016   Restless leg syndrome 05/12/2016   Primary osteoarthritis, left hand 05/12/2016   MELENA 10/18/2008   GASTROPARESIS 05/30/2008   Type 2 diabetes mellitus with neurological complications ([place]) 29/52/8413   HYPERCHOLESTEROLEMIA 05/04/2008   HYPOKALEMIA 05/04/2008   GERD 05/04/2008   Constipation 05/04/2008   HEMATEMESIS 05/04/2008   Essential hypertension 05/04/2008    PCP: Betty Martinique, MD  REFERRING PROVIDER: Larey Dresser, Raymond  REFERRING DIAG:  Diagnosis  I50.22 (ICD-10-CM) - Chronic systolic CHF (congestive heart failure) ([place])    THERAPY DIAG:  Bilateral leg weakness  Difficulty in walking, not elsewhere classified  Other symptoms and signs involving the musculoskeletal system  Rationale for Evaluation and Treatment: Rehabilitation  ONSET DATE: 11/14/2021  SUBJECTIVE:   SUBJECTIVE STATEMENT: Not feeling good today; not sure if it's the weather or what; pain left leg increased today 8/10; is still working part time; gets relief lying in the fetal position.  My left leg drags. States she is seeing a doctor regarding her thyroid on 05/12/22  Eval:Had open heart surgery August 10th; legs are weak; not strong enough to have cardiac rehab so trying to get legs stronger to be able to do cardiac rehab.  She states after her surgery she had burning in the groin area and numbness in right hand; has a hard time rolling in bed. Left leg is weaker and gives her more trouble than right.    PERTINENT HISTORY: DM, Neuropathy in toes Dr. Saintclair Halsted did a lumbar fusion 3 years ago (had 2 decompressions prior)   PAIN:  Are you having pain? Yes: NPRS scale: 7-10/10 Pain location: legs; left thigh is most painful Pain description: really sore Aggravating factors: standing, walking, but sometimes no reason and can have increased pain Relieving factors: pain medication; lying in fetal position  PRECAUTIONS: Fall  WEIGHT  BEARING RESTRICTIONS: No  FALLS:  Has patient fallen in last 6 months? No  LIVING ENVIRONMENT: Lives with: lives alone Lives in: House/apartment Stairs: No Has following equipment at home: Single point cane, Walker - 2 wheeled, and bed side commode  OCCUPATION: works part time 4 hours a week  PLOF: Independent  PATIENT GOALS: walk upright; be able to do cardiac rehab  NEXT MD VISIT: 3 months  OBJECTIVE:   DIAGNOSTIC FINDINGS: (2022) IMPRESSION: 1. Unchanged multilevel lumbar spondylosis as described above. Unchanged moderate spinal canal and bilateral lateral recess stenosis at L4-L5. 2. Unchanged left subarticular disc protrusion at L5-S1 posteriorly displacing the descending left S1 nerve root without impingement. 3. Prior L2-L3 PLIF with unchanged right subarticular osseous ridging encroaching on the descending right L3 nerve root without impingement. No residual stenosis.    PATIENT SURVEYS:  FOTO 44  COGNITION: Overall cognitive status: Within functional  limits for tasks assessed     SENSATION: Neuropathy in feet  EDEMA: none noted   POSTURE: forward head, decreased lumbar lordosis, and flexed trunk    LOWER EXTREMITY MMT:  MMT Right eval Left eval  Hip flexion 4 4  Hip extension    Hip abduction    Hip adduction    Hip internal rotation    Hip external rotation    Knee flexion (sitting isometric) *patient does not tolerate prone 4 4-  Knee extension 4+ 4+  Ankle dorsiflexion 4+ 4-  Ankle plantarflexion    Ankle inversion    Ankle eversion     (Blank rows = not tested)  FUNCTIONAL TESTS:  5 times sit to stand: 39.64 sec using hands to assist 2 minute walk test: 156 ft   GAIT: Distance walked: 156 ft Assistive device utilized: None Level of assistance: Modified independence Comments: slow ; flexed trunk; left leg internally rotates   TODAY'S TREATMENT:                                                                                                                                DATE:  05/01/22 Review of HEP and goals  Sit to stand x 5   Supine: LTR x 10 Bridge x 10 Left SKTC 5 x 20" Left hamstring stretch with strap 5 x 10" Trial of manual ankle dorsiflexion and eversion left ankle but minimal stretch felt per patient report  Sitting: Sciatic nerve floss x 10      04/18/22 physical therapy evaluation and HEP instruction    PATIENT EDUCATION:  Education details: Patient educated on exam findings, POC, scope of PT, HEP, and aquatic exercise; purchase of rollator. Person educated: Patient Education method: Explanation, Demonstration, and Handouts Education comprehension: verbalized understanding, returned demonstration, verbal cues required, and tactile cues required   HOME EXERCISE PROGRAM: Access Code: [email or web address] URL: [email or web address] Date: 04/18/2022 Prepared by: [facility] - Rehab  Exercises - Sit to Stand with Armchair  - 2 x daily - 7 x weekly - 1 sets - 5 reps - Supine Lower Trunk Rotation  - 2 x daily - 7 x weekly - 1 sets - 10 reps - Supine Bridge  - 2 x daily - 7 x weekly - 2 sets - 5 reps  ASSESSMENT:  CLINICAL IMPRESSION: Today's session started with a review of HEP and goals.  Patient verbalizes agreement with set rehab goals.  She is walking with a significant limp today; dragging left leg.  Discusses going to see Dr. Lorin Mercy regarding her left leg to rule out any orthopedic issue; patient does not have any report of tightness with manual ankle dorsiflexion or eversion.  Added hamstring stretch and SKTC to left side to improve left leg mobility.  After treatment patient reports less pain; walks with decreased limp although left foot starts to turn in (pigeon toe) the more she walks.   Patient will benefit from continued skilled therapy  services  to address deficits and promote return to optimal function.        Eval:Patient is a 73 y.o. lady who was seen today for physical therapy  evaluation and treatment for leg weakness. Patient presents to physical therapy with complaint of leg weakness; pain and overall decreased functional mobility. Patient demonstrates muscle weakness, reduced ROM, and fascial restrictions which are likely contributing to symptoms of pain and are negatively impacting patient ability to perform ADLs and functional mobility tasks. Patient will benefit from skilled physical therapy services to address these deficits to reduce pain and improve level of function with ADLs and functional mobility tasks.   OBJECTIVE IMPAIRMENTS: Abnormal gait, cardiopulmonary status limiting activity, decreased activity tolerance, decreased endurance, decreased knowledge of use of DME, decreased mobility, difficulty walking, decreased ROM, decreased strength, hypomobility, increased fascial restrictions, impaired perceived functional ability, impaired flexibility, improper body mechanics, postural dysfunction, and pain.   ACTIVITY LIMITATIONS: carrying, lifting, bending, sitting, standing, squatting, sleeping, stairs, transfers, bed mobility, reach over head, hygiene/grooming, locomotion level, and caring for others  PARTICIPATION LIMITATIONS: meal prep, cleaning, laundry, driving, shopping, community activity, and occupation  PERSONAL FACTORS: Fitness, Past/current experiences, and 1-2 comorbidities: DM, HBP  are also affecting patient's functional outcome.   REHAB POTENTIAL: Good  CLINICAL DECISION MAKING: Evolving/moderate complexity  EVALUATION COMPLEXITY: Moderate   GOALS: Goals reviewed with patient? No  SHORT TERM GOALS: Target date: 05/09/2022 patient will be independent with initial HEP  Baseline: Goal status: IN PROGRESS  2.  Patient will self report 30% improvement to improve tolerance for functional activity  Baseline:  Goal status: IN PROGRESS  LONG TERM GOALS: Target date: 05/30/2022  Patient will be independent in self management strategies to  improve quality of life and functional outcomes.  Baseline:  Goal status: IN PROGRESS  2.  Patient will improve FOTO score to predicted value  Baseline: 47 Goal status: IN PROGRESS  3.  Patient will self report 50% improvement to improve tolerance for functional activity  Baseline:  Goal status: IN PROGRESS  4.  Patient will increase distance on 2MWT to 224 ft  to demonstrate improved functional mobility walking household and community distances.   Baseline: 156 ft Goal status: IN PROGRESS  5.  Patient will improve 5 times sit to stand score from 39.64 sec to 25 sec or less to demonstrate improved functional mobility and increased lower extremity strength.  Baseline:  Goal status: IN PROGRESS    PLAN:  PT FREQUENCY: 1x/week  PT DURATION: 6 weeks  PLANNED INTERVENTIONS: Therapeutic exercises, Therapeutic activity, Neuromuscular re-education, Balance training, Gait training, Patient/Family education, Joint manipulation, Joint mobilization, Stair training, Orthotic/Fit training, DME instructions, Aquatic Therapy, Dry Needling, Electrical stimulation, Spinal manipulation, Spinal mobilization, Cryotherapy, Moist heat, Compression bandaging, scar mobilization, Splintting, Taping, Traction, Ultrasound, Ionotophoresis '4mg'$ /ml Dexamethasone, and Manual therapy   PLAN FOR NEXT SESSION:  core and lower extremity strengthening, gait, balance; left hip external rotation strength   3:19 PM, 05/01/22 Lynsi Dooner Small Lalo Tromp MPT [facility] [place] [phone] Ph:[phone]

## 2022-05-05 ENCOUNTER — Encounter: Payer: Self-pay | Admitting: Family Medicine

## 2022-05-05 ENCOUNTER — Encounter (HOSPITAL_COMMUNITY): Payer: Self-pay | Admitting: Physical Therapy

## 2022-05-05 DIAGNOSIS — E876 Hypokalemia: Secondary | ICD-10-CM

## 2022-05-06 ENCOUNTER — Encounter: Payer: Self-pay | Admitting: Registered Nurse

## 2022-05-06 ENCOUNTER — Encounter: Payer: Medicare Other | Attending: Physical Medicine & Rehabilitation | Admitting: Registered Nurse

## 2022-05-06 VITALS — BP 119/71 | HR 84 | Ht 68.0 in | Wt 171.0 lb

## 2022-05-06 DIAGNOSIS — E1142 Type 2 diabetes mellitus with diabetic polyneuropathy: Secondary | ICD-10-CM | POA: Diagnosis not present

## 2022-05-06 DIAGNOSIS — M79662 Pain in left lower leg: Secondary | ICD-10-CM | POA: Diagnosis not present

## 2022-05-06 DIAGNOSIS — Z5181 Encounter for therapeutic drug level monitoring: Secondary | ICD-10-CM

## 2022-05-06 DIAGNOSIS — M25572 Pain in left ankle and joints of left foot: Secondary | ICD-10-CM

## 2022-05-06 DIAGNOSIS — M2550 Pain in unspecified joint: Secondary | ICD-10-CM | POA: Diagnosis not present

## 2022-05-06 DIAGNOSIS — M542 Cervicalgia: Secondary | ICD-10-CM

## 2022-05-06 DIAGNOSIS — M545 Low back pain, unspecified: Secondary | ICD-10-CM | POA: Diagnosis not present

## 2022-05-06 DIAGNOSIS — M47812 Spondylosis without myelopathy or radiculopathy, cervical region: Secondary | ICD-10-CM

## 2022-05-06 DIAGNOSIS — M5412 Radiculopathy, cervical region: Secondary | ICD-10-CM | POA: Diagnosis not present

## 2022-05-06 DIAGNOSIS — G8929 Other chronic pain: Secondary | ICD-10-CM | POA: Diagnosis not present

## 2022-05-06 DIAGNOSIS — Z79891 Long term (current) use of opiate analgesic: Secondary | ICD-10-CM | POA: Diagnosis not present

## 2022-05-06 DIAGNOSIS — G894 Chronic pain syndrome: Secondary | ICD-10-CM | POA: Diagnosis not present

## 2022-05-06 MED ORDER — POTASSIUM CHLORIDE CRYS ER 20 MEQ PO TBCR: 20.0000 meq | EXTENDED_RELEASE_TABLET | Freq: Every day | ORAL | 3 refills | Status: DC

## 2022-05-06 MED ORDER — HYDROCODONE-ACETAMINOPHEN 10-325 MG PO TABS: 1.0000 | ORAL_TABLET | Freq: Four times a day (QID) | ORAL | 0 refills | Status: DC | PRN

## 2022-05-06 NOTE — Progress Notes (Signed)
Subjective:    Patient ID: [name], female    DOB: 13-Mar-1951, 72 y.o.   MRN: [id number]  HPI: [name] is a 72 y.o. female who returns for follow up appointment for chronic pain and medication refill. She states her pain is located in  her neck radiating into her left shoulder, reports increase intensity of pain, she will follow up with Dr Saintclair Halsted  she reports. She also reports lower back pain mainly right side, left ankle pain and bilateral feet pain with tingling and burning. She rates her pain 8. Her current exercise regime is walking and performing stretching exercises.  [name] Morphine equivalent is 40.00 MME. She is also prescribed Clonazepam  by Dr. Martinique .We have discussed the black box warning of using opioids and benzodiazepines. I highlighted the dangers of using these drugs together and discussed the adverse events including respiratory suppression, overdose, cognitive impairment and importance of compliance with current regimen. We will continue to monitor and adjust as indicated.    UDS ordered today.    Pain Inventory Average Pain 9 Pain Right Now 8 My pain is sharp, burning, tingling, and aching  In the last 24 hours, has pain interfered with the following? General activity 8 Relation with others 8 Enjoyment of life 8 What TIME of day is your pain at its worst? daytime, evening, and night Sleep (in general) Fair  Pain is worse with: walking, bending, inactivity, standing, and some activites Pain improves with: rest, heat/ice, pacing activities, medication, TENS, and injections Relief from Meds: 10  Family History  Problem Relation Age of Onset   Diabetes Mother    Alzheimer's disease Mother    Lung cancer Father    Esophageal cancer Brother    Colon cancer Neg Hx    Social History   Socioeconomic History   Marital status: Widowed    Spouse name: Not on file   Number of children: 1   Years of education: Not on file   Highest education  level: 12th grade  Occupational History   Occupation: retired    Comment: work part time  Tobacco Use   Smoking status: Never   Smokeless tobacco: Never   Tobacco comments:    Never smoke 11/01/21  Vaping Use   Vaping Use: Never used  Substance and Sexual Activity   Alcohol use: Never   Drug use: No   Sexual activity: Not on file  Other Topics Concern   Not on file  Social History Narrative   Not on file   Social Determinants of Health   Financial Resource Strain: Low Risk  (01/16/2022)   Overall Financial Resource Strain (CARDIA)    Difficulty of Paying Living Expenses: Not very hard  Food Insecurity: Food Insecurity Present (01/16/2022)   Hunger Vital Sign    Worried About [place] in the Last Year: Sometimes true    Ran Out of Food in the Last Year: Sometimes true  Transportation Needs: No Transportation Needs (01/16/2022)   PRAPARE - Hydrologist (Medical): No    Lack of Transportation (Non-Medical): No  Physical Activity: Insufficiently Active (01/16/2022)   Exercise Vital Sign    Days of Exercise per Week: 3 days    Minutes of Exercise per Session: 30 min  Stress: Stress Concern Present (01/16/2022)   [place]    Feeling of Stress : To some extent  Social Connections: Moderately Integrated (  01/16/2022)   Social Connection and Isolation Panel [NHANES]    Frequency of Communication with Friends and Family: More than three times a week    Frequency of Social Gatherings with Friends and Family: Twice a week    Attends Religious Services: More than 4 times per year    Active Member of [employer] or Organizations: Yes    Attends Archivist Meetings: More than 4 times per year    Marital Status: Widowed   Past Surgical History:  Procedure Laterality Date   ABDOMINAL HYSTERECTOMY     partial   ACHILLES TENDON SURGERY Left    APPENDECTOMY     BACK SURGERY      BLADDER SUSPENSION     BREAST SURGERY Right 2003   milk duct removed   CARDIAC CATHETERIZATION     CHOLECYSTECTOMY     CORONARY ARTERY BYPASS GRAFT N/A 11/14/2021   Procedure: CORONARY ARTERY BYPASS GRAFTING (CABG) TIMES THREE USING LIMA AND CRYO VEIN.;  Surgeon: Dahlia Byes, MD;  Location: [facility];  Service: Open Heart Surgery;  Laterality: N/A;   FRACTURE SURGERY     collar bone   LEFT HEART CATH AND CORONARY ANGIOGRAPHY N/A 03/08/2021   Procedure: LEFT HEART CATH AND CORONARY ANGIOGRAPHY;  Surgeon: Martinique, Peter M, MD;  Location: [facility];  Service: Cardiovascular;  Laterality: N/A;   NOSE SURGERY     x 2    OTHER SURGICAL HISTORY     Breast Duct removed   PARTIAL HYSTERECTOMY     RECTOCELE REPAIR     TEE WITHOUT CARDIOVERSION N/A 11/14/2021   Procedure: TRANSESOPHAGEAL ECHOCARDIOGRAM (TEE);  Surgeon: Dahlia Byes, MD;  Location: [facility];  Service: Open Heart Surgery;  Laterality: N/A;   TONSILLECTOMY     VASCULAR SURGERY Right    right leg   VEIN SURGERY Right    leg   Past Surgical History:  Procedure Laterality Date   ABDOMINAL HYSTERECTOMY     partial   ACHILLES TENDON SURGERY Left    APPENDECTOMY     BACK SURGERY     BLADDER SUSPENSION     BREAST SURGERY Right 2003   milk duct removed   CARDIAC CATHETERIZATION     CHOLECYSTECTOMY     CORONARY ARTERY BYPASS GRAFT N/A 11/14/2021   Procedure: CORONARY ARTERY BYPASS GRAFTING (CABG) TIMES THREE USING LIMA AND CRYO VEIN.;  Surgeon: Dahlia Byes, MD;  Location: [facility];  Service: Open Heart Surgery;  Laterality: N/A;   FRACTURE SURGERY     collar bone   LEFT HEART CATH AND CORONARY ANGIOGRAPHY N/A 03/08/2021   Procedure: LEFT HEART CATH AND CORONARY ANGIOGRAPHY;  Surgeon: Martinique, Peter M, MD;  Location: [facility];  Service: Cardiovascular;  Laterality: N/A;   NOSE SURGERY     x 2    OTHER SURGICAL HISTORY     Breast Duct removed   PARTIAL HYSTERECTOMY     RECTOCELE REPAIR     TEE WITHOUT  CARDIOVERSION N/A 11/14/2021   Procedure: TRANSESOPHAGEAL ECHOCARDIOGRAM (TEE);  Surgeon: Dahlia Byes, MD;  Location: [facility];  Service: Open Heart Surgery;  Laterality: N/A;   TONSILLECTOMY     VASCULAR SURGERY Right    right leg   VEIN SURGERY Right    leg   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[place])    Anxiety    Arthritis    Diabetes mellitus without complication (HCC)    GERD (gastroesophageal reflux disease)  Heart murmur    Hyperlipidemia    Hypertension    Neuromuscular disorder (HCC)    Palpitations    Pneumonia    PONV (postoperative nausea and vomiting)    PTSD (post-traumatic stress disorder)    Ringing of ears, left    There were no vitals taken for this visit.  Opioid Risk Score:   Fall Risk Score:  `1  Depression screen [facility] 2/9     01/18/2022   12:53 PM 01/07/2022    2:29 PM 10/31/2021    2:55 PM 09/04/2021    2:25 PM 06/10/2021    2:54 PM 05/14/2021    3:21 PM 04/23/2021    2:28 PM  Depression screen PHQ 2/9  Decreased Interest 0 0 0 2 0 0 1  Down, Depressed, Hopeless 0 0 0 '1 3 2 1  '$ PHQ - 2 Score 0 0 0 '3 3 2 2  '$ Altered sleeping '1   1  2 1  '$ Tired, decreased energy '1   1  1 1  '$ Change in appetite 0   '1  2 1  '$ Feeling bad or failure about yourself  1   0  1 0  Trouble concentrating 0   0  1 0  Moving slowly or fidgety/restless 0   0  1 0  Suicidal thoughts 0   0  0 0  PHQ-9 Score '3   6  10 5  '$ Difficult doing work/chores Somewhat difficult   Not difficult at all  Somewhat difficult Somewhat difficult      Review of Systems  Musculoskeletal:  Positive for back pain and gait problem.  All other systems reviewed and are negative.     Objective:   Physical Exam Vitals and nursing note reviewed.  Constitutional:      Appearance: Normal appearance.  Neck:     Comments: Cervical Paraspinal Tenderness: C-5- C-6 Cardiovascular:     Rate and Rhythm: Normal rate and regular rhythm.     Pulses: Normal pulses.     Heart sounds:  Normal heart sounds.  Pulmonary:     Effort: Pulmonary effort is normal.     Breath sounds: Normal breath sounds.  Musculoskeletal:     Cervical back: Normal range of motion and neck supple.     Comments: Normal Muscle Bulk and Muscle Testing Reveals:  Upper Extremities: Right: Full ROM and Muscle Strength 5/5 Left Upper Extremity: Decreased ROM 90 Degrees and Muscle Strength 4/5  Lumbar Paraspinal Tenderness: L-4-L-5 Lower Extremities: Right: Full ROM and Muscle Strength 5/5 Left Lower Extremity: Decreased ROM and Muscle Strength 5/5 Left Lower Extremity Flexion Produces Pain into her Left Lower extremity Arises from chair slowly Antalgic  Gait     Skin:    General: Skin is warm and dry.  Neurological:     Mental Status: She is alert and oriented to person, place, and time.  Psychiatric:        Mood and Affect: Mood normal.        Behavior: Behavior normal.           Assessment & Plan:  1.Chronic cervicalgia with documented spondylosis on MRI, per Dr. Naaman Plummer Note and Cervical Radiculitis: Continue Gabapentin,  Continue HEP as Tolerated. 05/06/2022 2. Left shoulder pain most consistent with left rotator cuff syndrome. Mild DJD.  Continue HEP as tolerated.Continue to monitor. 05/06/2022. Refilled:  Hydrocodone 10/325 mg one tablet every 6 hours as needed for pain. #120. Second script sent for the following month. We will  continue the opioid monitoring program, this consists of regular clinic visits, examinations, urine drug screen, pill counts as well as use of [place] Controlled Substance Reporting system. A 12 month History has been reviewed on the [place] on 05/06/2022. 3. Left wrist/finger pain most consistent with OA, ?post traumatic. No complaints today. 05/06/2022 4. Anxiety: Continue Klonopin, PCP prescribing. Continue to monitor. 05/06/2022 5. Restless Leg syndrome: Continue Gabapentin. 05/06/2022 7. Sacroiliac  inflammation: S/P Right  SI injection on 09/20/2019, with No relief noted. [name] seen Dr Marlou Sa in regards to her pain. Dr Saintclair Halsted. Following.  Continue to Monitor. 05/06/2022 8. Myofascial Pain: Continue Robaxin as needed. 05/06/2022.  9. Bilateral feet with neuropathic pain: Increase Gabapentin 600 mg 4 times a day. Continue to monitor. 05/06/2022. 10.  Herniated Nucleus Pulposus L2-3 with recurrent disc rupture: S/P  Posterior Lumbar Interbody Fusion  - Lumbar two-Lumbar three on 01/13/2020 by Dr Saintclair Halsted.  Dr Saintclair Halsted Following. 05/06/2022 11. Left Ankle Pain: Ortho Following. She has physical therapy weekly. She also reports she is scheduled for X-ray. Continue to monitor.    F/U in 2 months

## 2022-05-08 ENCOUNTER — Encounter (HOSPITAL_COMMUNITY): Payer: Medicare Other | Admitting: Physical Therapy

## 2022-05-09 LAB — TOXASSURE SELECT,+ANTIDEPR,UR

## 2022-05-12 ENCOUNTER — Ambulatory Visit: Payer: Medicare Other | Admitting: Family Medicine

## 2022-05-12 NOTE — Progress Notes (Unsigned)
HPI: [name] is a 72 y.o. female, who is here today for follow up. She was last seen on 01/17/22. Since her last visit she has followed with her cardiologist. Frustrated because according to pt, every visit they "find something new" that needs to be followed. Follows q 3 months. Labs done last month, abnormal thyroid function test. + Fatigue, which she attributes to some of her medications ans stress. No changes in bowel habits.  Lab Results  Component Value Date   TSH 0.038 (L) 04/09/2022   Hypertension: She is on Metoprolol Succinate 25 mg 1.5 tab daily. According to pt, there has been a concerned about irregular HR, it was recommended to increased dose of Metoprolol to 100 mg but she declined. She does not feel palpitations and she is feeling well. HFrEF: Last echo 02/2013 LVEF 45-50% and grade I diastolic dysfunction. Negative for orthopnea or PMD. She also takes Furosemide 20 mg daily as needed. HypoK+ on KLOR  20 meq daily. Negative for unusual or severe headache, visual changes, exertional chest pain, dyspnea,  focal weakness, or edema.  Lab Results  Component Value Date   CREATININE 0.98 03/18/2022   BUN 15 03/18/2022   NA 137 03/18/2022   K 3.9 03/18/2022   CL 104 03/18/2022   CO2 23 03/18/2022   Diabetes Mellitus II:  Checking BG at home: Not checking. Medications: Jardiance 10 mg and Trulicity 1.5 mg weekly.  Negative for symptoms of hypoglycemia, polyuria, polydipsia, foot ulcers/trauma Peripheral neuropathy , burning like sensation. Currently she is on gabapentin 600 mg 4 times daily, recently increased. Having left tight pain and left foot turning inward, the latter one noted during PT. States that left ankle X ray has been ordered. According to pt, there was a nerve injury during vascular procedure, left inguinal. No numbness or tingling. Lab Results  Component Value Date   HGBA1C 5.5 01/17/2022   Lab Results  Component Value Date    MICROALBUR 0.4 01/17/2022   Reporting long hx of RLS, which affects her sleep. Leg movement when she is in bed, last about 1-1.5 hours before she can finally fall asleep.  Anxiety: Currently she is on clonazepam 1 mg twice daily as needed. Insomnia and depression: Currently she is on Seroquel 100 mg 1/2 tablet daily at bedtime.    05/13/2022    2:07 PM 05/06/2022    2:23 PM 01/18/2022   12:53 PM 01/07/2022    2:29 PM 10/31/2021    2:55 PM  Depression screen PHQ 2/9  Decreased Interest 0 0 0 0 0  Down, Depressed, Hopeless 0 0 0 0 0  PHQ - 2 Score 0 0 0 0 0  Altered sleeping 0  1    Tired, decreased energy 0  1    Change in appetite 0  0    Feeling bad or failure about yourself  0  1    Trouble concentrating 0  0    Moving slowly or fidgety/restless 0  0    Suicidal thoughts 0  0    PHQ-9 Score 0  3    Difficult doing work/chores Not difficult at all  Somewhat difficult     Review of Systems  Constitutional:  Negative for chills, diaphoresis and fever.  HENT:  Negative for sore throat.   Respiratory:  Negative for cough and wheezing.   Gastrointestinal:  Negative for abdominal pain, nausea and vomiting.  Endocrine: Negative for cold intolerance and heat intolerance.  Genitourinary:  Negative  for decreased urine volume, dysuria and hematuria.  Skin:  Negative for rash.  Neurological:  Negative for syncope and facial asymmetry.  Psychiatric/Behavioral:  Negative for confusion and hallucinations.   See other pertinent positives and negatives in HPI.  Current Outpatient Medications on File Prior to Visit  Medication Sig Dispense Refill   albuterol (VENTOLIN HFA) 108 (90 Base) MCG/ACT inhaler INHALE 2 PUFFS INTO THE LUNGS EVERY 6 HOURS AS NEEDED FOR WHEEZING OR SHORTNESS OF BREATH 18 g 2   Alcohol Swabs (B-D SINGLE USE SWABS REGULAR) PADS Use to test blood sugar 3 times daily 100 each 3   Blood Glucose Monitoring Suppl (ONETOUCH VERIO FLEX SYSTEM) w/Device KIT Use to test blood sugars  1-2 times daily. 1 kit 0   clonazePAM (KLONOPIN) 1 MG tablet TAKE 1 TABLET(1 MG) BY MOUTH TWICE DAILY AS NEEDED FOR ANXIETY 60 tablet 3   clopidogrel (PLAVIX) 75 MG tablet Take 1 tablet (75 mg total) by mouth daily. 90 tablet 3   Dulaglutide (TRULICITY) 1.5 LT/9.0ZE SOPN Inject 1.5 mg into the skin once a week. 13 mL 3   Evolocumab (REPATHA SURECLICK) 092 MG/ML SOAJ Inject 140 mg into the skin every 14 (fourteen) days. 6 mL 3   ezetimibe (ZETIA) 10 MG tablet TAKE 1 TABLET(10 MG) BY MOUTH DAILY 90 tablet 3   fluticasone (FLONASE) 50 MCG/ACT nasal spray Place 2 sprays into both nostrils daily.     furosemide (LASIX) 20 MG tablet Take 20 mg by mouth daily as needed.     gabapentin (NEURONTIN) 600 MG tablet TAKE 1 TABLET BY MOUTH EVERY MORNING AND AT NOON AND EVERY EVENING AND AT BEDTIME 120 tablet 3   guaiFENesin (MUCINEX) 600 MG 12 hr tablet Take 600 mg by mouth daily.     HYDROcodone-acetaminophen (NORCO) 10-325 MG tablet Take 1 tablet by mouth every 6 (six) hours as needed. 120 tablet 0   JARDIANCE 10 MG TABS tablet Take 1 tablet (10 mg total) by mouth daily. 90 tablet 3   Lancets (ONETOUCH ULTRASOFT) lancets Use to test blood sugar 1-2 times daily. 100 each 12   loratadine (CLARITIN) 10 MG tablet Take 10 mg by mouth daily.     melatonin 5 MG TABS Take 1 tablet (5 mg total) by mouth at bedtime as needed.  0   metoprolol succinate (TOPROL XL) 25 MG 24 hr tablet Take 1.5 tablets (37.5 mg total) by mouth 2 (two) times daily. 240 tablet 3   multivitamin-lutein (OCUVITE-LUTEIN) CAPS capsule Take 1 capsule by mouth daily.     pantoprazole (PROTONIX) 40 MG tablet Take 1 tablet (40 mg total) by mouth daily. 30 tablet 3   Polyethyl Glycol-Propyl Glycol (SYSTANE) 0.4-0.3 % SOLN Place 1 drop into both eyes daily as needed (Dry eye).     potassium chloride SA (KLOR-CON M) 20 MEQ tablet Take 1 tablet (20 mEq total) by mouth daily. 30 tablet 3   QUEtiapine (SEROQUEL) 100 MG tablet TAKE 1/2 TABLET BY MOUTH AT  BEDTIME. 45 tablet 2   triamcinolone (KENALOG) 0.1 % paste Use as directed 1 Application in the mouth or throat daily as needed (mouth pain).     No current facility-administered medications on file prior to visit.   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[place])    Anxiety    Arthritis    Diabetes mellitus without complication (HCC)    GERD (gastroesophageal reflux disease)    Heart murmur    Hyperlipidemia  Hypertension    Neuromuscular disorder (HCC)    Palpitations    Pneumonia    PONV (postoperative nausea and vomiting)    PTSD (post-traumatic stress disorder)    Ringing of ears, left    Allergies  Allergen Reactions   Morphine And Related Itching    With high doses   Statins     Hx intolerance to multiple statins   Penicillins Rash    Has patient had a PCN reaction causing immediate rash, facial/tongue/throat swelling, SOB or lightheadedness with hypotension: Yes Has patient had a PCN reaction causing severe rash involving mucus membranes or skin necrosis: No Has patient had a PCN reaction that required hospitalization: No Has patient had a PCN reaction occurring within the last 10 years: No If all of the above answers are "NO", then may proceed with Cephalosporin use.     Social History   Socioeconomic History   Marital status: Widowed    Spouse name: Not on file   Number of children: 1   Years of education: Not on file   Highest education level: 12th grade  Occupational History   Occupation: retired    Comment: work part time  Tobacco Use   Smoking status: Never   Smokeless tobacco: Never   Tobacco comments:    Never smoke 11/01/21  Vaping Use   Vaping Use: Never used  Substance and Sexual Activity   Alcohol use: Never   Drug use: No   Sexual activity: Not on file  Other Topics Concern   Not on file  Social History Narrative   Not on file   Social Determinants of Health   Financial Resource Strain: Low Risk  (01/16/2022)    Overall Financial Resource Strain (CARDIA)    Difficulty of Paying Living Expenses: Not very hard  Food Insecurity: Food Insecurity Present (01/16/2022)   Hunger Vital Sign    Worried About [place] in the Last Year: Sometimes true    Ran Out of Food in the Last Year: Sometimes true  Transportation Needs: No Transportation Needs (01/16/2022)   PRAPARE - Hydrologist (Medical): No    Lack of Transportation (Non-Medical): No  Physical Activity: Insufficiently Active (01/16/2022)   Exercise Vital Sign    Days of Exercise per Week: 3 days    Minutes of Exercise per Session: 30 min  Stress: Stress Concern Present (01/16/2022)   [place]    Feeling of Stress : To some extent  Social Connections: Moderately Integrated (01/16/2022)   Social Connection and Isolation Panel [NHANES]    Frequency of Communication with Friends and Family: More than three times a week    Frequency of Social Gatherings with Friends and Family: Twice a week    Attends Religious Services: More than 4 times per year    Active Member of [employer] or Organizations: Yes    Attends Archivist Meetings: More than 4 times per year    Marital Status: Widowed   Today's Vitals   05/13/22 1355  BP: 118/70  Pulse: 73  Resp: 16  SpO2: 97%  Weight: 174 lb 6 oz (79.1 kg)  Height: '5\' 8"'$  (1.727 m)  Body mass index is 26.51 kg/m.  Physical Exam Vitals and nursing note reviewed.  Constitutional:      General: She is not in acute distress.    Appearance: She is well-developed.  HENT:     Head: Normocephalic and  atraumatic.     Mouth/Throat:     Mouth: Mucous membranes are moist.     Pharynx: Oropharynx is clear.  Eyes:     Conjunctiva/sclera: Conjunctivae normal.  Neck:     Thyroid: Thyromegaly (? left thyroid nodule.) present.  Cardiovascular:     Rate and Rhythm: Normal rate. Rhythm irregular.      Pulses:          Dorsalis pedis pulses are 2+ on the right side and 2+ on the left side.     Heart sounds: Murmur (soft SEM LUSB) heard.  Pulmonary:     Effort: Pulmonary effort is normal. No respiratory distress.     Breath sounds: Normal breath sounds.  Abdominal:     Palpations: Abdomen is soft. There is no hepatomegaly or mass.     Tenderness: There is no abdominal tenderness.  Lymphadenopathy:     Cervical: No cervical adenopathy.  Skin:    General: Skin is warm.     Findings: No erythema or rash.  Neurological:     General: No focal deficit present.     Mental Status: She is alert and oriented to person, place, and time.     Cranial Nerves: No cranial nerve deficit.     Gait: Gait normal.  Psychiatric:        Mood and Affect: Mood is anxious. Affect is labile.   ASSESSMENT AND PLAN:  Ms. Ruthanne was seen today for medical management of chronic issues.  Diagnoses and all orders for this visit: Lab Results  Component Value Date   HGBA1C 6.0 (A) 05/13/2022   Essential hypertension Assessment & Plan: BP adequately controlled. Continue Metoprolol succinate 25 mg 1 1/2 tablet daily. Continue low-salt/DASH diet.   Anxiety disorder, unspecified type Assessment & Plan: Exacerbated by recent health issues. Continue Clonazepam 1 mg bid prn and Seroquel 100 mg 3/4 tab at bedtime.   Hyperthyroidism Assessment & Plan: Last TSH 0.038. She doe snot seem to be symptomatic. We discussed possible etiologies. Thyroid panel repeated today. ? Left thyroid nodule, so thyroid US is being arranged.  Orders: -     T4, free; Future -     TSH; Future -     Thyrotropin receptor autoabs; Future -     US THYROID; Future  RLS (restless legs syndrome) Assessment & Plan: Problem is not well controlled. Continue Gabapentin 600 mg qid. Requip 0.5 mg at bedtime started today, recommend trying 1/2 tab and increase to 1 tab if needed.  Orders: -     rOPINIRole HCl; Take 1 tablet (0.5 mg  total) by mouth at bedtime.  Dispense: 30 tablet; Refill: 1  Type 2 diabetes mellitus with neurological complications [facility]) Assessment & Plan: Problem is well controlled, HgA1C went from 5.5 to 6.0 Continue Jardiance 10 mg and Trulicity 1.5 mg weekly. Annual eye exam, periodic dental and foot care to continue. F/U in 5 months.  Orders: -     POCT glycosylated hemoglobin (Hb S5K)  Chronic systolic CHF (congestive heart failure) ([place]) Assessment & Plan: Asymptomatic. Last echo 02/2013 LVEF 45-50% and grade I diastolic dysfunction. Continue Jardiance 10 mg daily, Metoprolol succinate 25 mg 1.5 tab daily,and Furosemide 20 mg daily. Follows with cardiologist.   Polyneuropathy associated with underlying disease ([place]) Assessment & Plan: Having new symptoms on left thigh. Continue Gabapentin 600 mg 4 times per day. Some side effects of med discussed. Fall precautions to continue. Follows with neuro and pain management.   Depression, major, recurrent, in  partial remission (HCC) Assessment & Plan: Problem is stable. Continue Seroquel 100 gm 3/4 tab at bedtime.   Atherosclerosis of aorta [facility]) Assessment & Plan: She has not tolerated statins. Continue Repatha 140 mg q 14 days and Zetia 10 mg daily. On Plavix 75 mg daily.   I spent a total of 63 +9  minutes in face to face and non face to face activities respectively for this visit on the date of this encounter. During this time history was obtained and documented, examination was performed, prior labs/imaging reviewed, and assessment/plan discussed.She is very anxious about current health problems and thyroid abnormalities. She is specifically concerned about the possibility of thyroid cancer.  Return in about 5 months (around 10/11/2022) for chronic problems.  Calisha Tindel G. Martinique, MD  [facility]. [place] office.

## 2022-05-13 ENCOUNTER — Ambulatory Visit (INDEPENDENT_AMBULATORY_CARE_PROVIDER_SITE_OTHER): Payer: Medicare Other | Admitting: Family Medicine

## 2022-05-13 ENCOUNTER — Encounter: Payer: Self-pay | Admitting: Family Medicine

## 2022-05-13 VITALS — BP 118/70 | HR 73 | Resp 16 | Ht 68.0 in | Wt 174.4 lb

## 2022-05-13 DIAGNOSIS — F419 Anxiety disorder, unspecified: Secondary | ICD-10-CM

## 2022-05-13 DIAGNOSIS — I700 Atherosclerosis of aorta: Secondary | ICD-10-CM

## 2022-05-13 DIAGNOSIS — I5022 Chronic systolic (congestive) heart failure: Secondary | ICD-10-CM

## 2022-05-13 DIAGNOSIS — I10 Essential (primary) hypertension: Secondary | ICD-10-CM

## 2022-05-13 DIAGNOSIS — E1149 Type 2 diabetes mellitus with other diabetic neurological complication: Secondary | ICD-10-CM | POA: Diagnosis not present

## 2022-05-13 DIAGNOSIS — E0590 Thyrotoxicosis, unspecified without thyrotoxic crisis or storm: Secondary | ICD-10-CM | POA: Diagnosis not present

## 2022-05-13 DIAGNOSIS — G63 Polyneuropathy in diseases classified elsewhere: Secondary | ICD-10-CM

## 2022-05-13 DIAGNOSIS — G2581 Restless legs syndrome: Secondary | ICD-10-CM

## 2022-05-13 DIAGNOSIS — F3341 Major depressive disorder, recurrent, in partial remission: Secondary | ICD-10-CM

## 2022-05-13 LAB — POCT GLYCOSYLATED HEMOGLOBIN (HGB A1C): Hemoglobin A1C: 6 % — AB (ref 4.0–5.6)

## 2022-05-13 MED ORDER — ROPINIROLE HCL 0.5 MG PO TABS: 0.5000 mg | ORAL_TABLET | Freq: Every day | ORAL | 1 refills | Status: DC

## 2022-05-13 NOTE — Assessment & Plan Note (Signed)
She has not tolerated statins. Continue Repatha 140 mg q 14 days and Zetia 10 mg daily. On Plavix 75 mg daily.

## 2022-05-13 NOTE — Assessment & Plan Note (Signed)
Exacerbated by recent health issues. Continue Clonazepam 1 mg bid prn and Seroquel 100 mg 3/4 tab at bedtime.

## 2022-05-13 NOTE — Assessment & Plan Note (Signed)
Asymptomatic. Last echo 02/2013 LVEF 45-50% and grade I diastolic dysfunction. Continue Jardiance 10 mg daily, Metoprolol succinate 25 mg 1.5 tab daily,and Furosemide 20 mg daily. Follows with cardiologist.

## 2022-05-13 NOTE — Assessment & Plan Note (Signed)
Problem is stable. Continue Seroquel 100 gm 3/4 tab at bedtime.

## 2022-05-13 NOTE — Assessment & Plan Note (Signed)
Last TSH 0.038. She doe snot seem to be symptomatic. We discussed possible etiologies. Thyroid panel repeated today. ? Left thyroid nodule, so thyroid US is being arranged.

## 2022-05-13 NOTE — Patient Instructions (Addendum)
A few things to remember from today's visit:  Essential hypertension  Anxiety disorder, unspecified type  Hyperthyroidism - Plan: T4, free, TSH, Thyrotropin receptor autoabs  RLS (restless legs syndrome) - Plan: rOPINIRole (REQUIP) 0.5 MG tablet  Type 2 diabetes mellitus with neurological complications ([place]) - Plan: POC HgB A1c Requip 0.5 mg started today for restless leg synd, start with 1/2 at bedtime and increase to 1 tab if needed. No changes in rest of meds for now. If you need refills for medications you take chronically, please call your pharmacy. Do not use My Chart to request refills or for acute issues that need immediate attention. If you send a my chart message, it may take a few days to be addressed, specially if I am not in the office.  Please be sure medication list is accurate. If a new problem present, please set up appointment sooner than planned today.

## 2022-05-13 NOTE — Assessment & Plan Note (Signed)
Problem is well controlled, HgA1C went from 5.5 to 6.0 Continue Jardiance 10 mg and Trulicity 1.5 mg weekly. Annual eye exam, periodic dental and foot care to continue. F/U in 5 months.

## 2022-05-13 NOTE — Assessment & Plan Note (Signed)
Problem is not well controlled. Continue Gabapentin 600 mg qid. Requip 0.5 mg at bedtime started today, recommend trying 1/2 tab and increase to 1 tab if needed.

## 2022-05-13 NOTE — Assessment & Plan Note (Signed)
Having new symptoms on left thigh. Continue Gabapentin 600 mg 4 times per day. Some side effects of med discussed. Fall precautions to continue. Follows with neuro and pain management.

## 2022-05-13 NOTE — Assessment & Plan Note (Signed)
BP adequately controlled. Continue Metoprolol succinate 25 mg 1 1/2 tablet daily. Continue low-salt/DASH diet.

## 2022-05-14 LAB — T4, FREE: Free T4: 0.82 ng/dL (ref 0.60–1.60)

## 2022-05-14 LAB — TSH: TSH: 0.04 u[IU]/mL — ABNORMAL LOW (ref 0.35–5.50)

## 2022-05-15 ENCOUNTER — Ambulatory Visit: Payer: Medicare Other | Admitting: Orthopaedic Surgery

## 2022-05-15 ENCOUNTER — Encounter (HOSPITAL_COMMUNITY): Payer: Medicare Other | Admitting: Physical Therapy

## 2022-05-15 LAB — THYROTROPIN RECEPTOR AUTOABS: Thyrotropin Receptor Ab: <1.1 IU/L (ref 0.00–1.75)

## 2022-05-29 ENCOUNTER — Ambulatory Visit (INDEPENDENT_AMBULATORY_CARE_PROVIDER_SITE_OTHER): Payer: Medicare Other

## 2022-05-29 ENCOUNTER — Encounter (HOSPITAL_COMMUNITY): Payer: Medicare Other

## 2022-05-29 ENCOUNTER — Ambulatory Visit (INDEPENDENT_AMBULATORY_CARE_PROVIDER_SITE_OTHER): Payer: Medicare Other | Admitting: Orthopaedic Surgery

## 2022-05-29 ENCOUNTER — Encounter: Payer: Self-pay | Admitting: Orthopaedic Surgery

## 2022-05-29 VITALS — Ht 68.0 in | Wt 174.0 lb

## 2022-05-29 DIAGNOSIS — M25572 Pain in left ankle and joints of left foot: Secondary | ICD-10-CM

## 2022-05-29 DIAGNOSIS — M79672 Pain in left foot: Secondary | ICD-10-CM

## 2022-05-29 NOTE — Progress Notes (Signed)
Office Visit Note   Patient: [name]           Date of Birth: 1951/02/05           MRN: [id number] Visit Date: 05/29/2022              Requested by: Larey Dresser, MD [phone],  [place] [id number] PCP: Larey Dresser, MD   Assessment & Plan: Visit Diagnoses:  1. Pain in left foot   2. Pain in left ankle and joints of left foot     Plan: Patient has internal tibial torsion which has been present her entire life.  She does not fall.  We discussed that correction of this would require cutting the bone.  She not had problems other than internal rotation.  She does have some diabetes with numbness in her feet and ankles.  Encouraged her to continue to work hard on diabetic compliance.  Last A1c 6.0.  She can follow-up on an as-needed basis.  Follow-Up Instructions: No follow-ups on file.   Orders:  Orders Placed This Encounter  Procedures   XR Foot Complete Left   XR Ankle Complete Left   No orders of the defined types were placed in this encounter.     Procedures: No procedures performed   Clinical Data: No additional findings.   Subjective: Chief Complaint  Patient presents with   Left Ankle - Pain   Left Foot - Pain    HPI patient been trying to do cardiac rehab was noted she had internal rotation more on the left foot than right foot.  She states this was mentioned to her even in middle school or high school by the coaches.  She has some numbness in her legs.  She denies hip problems.  She has had some pain near the ankle anterolateral just above the mortise at times.  Review of Systems positive for CABG x 3 restless leg syndrome polyneuropathy.  All systems noncontributory to HPI.   Objective: Vital Signs: Ht '5\' 8"'$  (1.727 m)   Wt 174 lb (78.9 kg)   BMI 26.46 kg/m   Physical Exam Constitutional:      Appearance: She is well-developed.  HENT:     Head: Normocephalic.     Right Ear: External ear normal.     Left  Ear: External ear normal. There is no impacted cerumen.  Eyes:     Pupils: Pupils are equal, round, and reactive to light.  Neck:     Thyroid: No thyromegaly.     Trachea: No tracheal deviation.  Cardiovascular:     Rate and Rhythm: Normal rate.  Pulmonary:     Effort: Pulmonary effort is normal.  Abdominal:     Palpations: Abdomen is soft.  Musculoskeletal:     Cervical back: No rigidity.  Skin:    General: Skin is warm and dry.  Neurological:     Mental Status: She is alert and oriented to person, place, and time.  Psychiatric:        Behavior: Behavior normal.     Ortho Exam patient has internal rotation lower extremities with tibial torsion worse on the left than right.  Intermalleolar axis shows lateral malleolus 10 degrees anterior to the medial malleolus.  Normal hip range of motion no evidence of femoral anteversion.  Good knee range of motion.  Specialty Comments:  No specialty comments available.  Imaging: XR Ankle Complete Left  Result Date: 05/30/2022 Left ankle  AP lateral mortise demonstrates intact mortise without subluxation. Impression: Normal left ankle x-rays.  XR Foot Complete Left  Result Date: 05/30/2022 Three-view x-rays left foot obtained and reviewed.  This shows normal bone anatomy.  She does have mild hallux valgus.  Negative for bone fracture or significant degenerative changes. Pression: Unremarkable left foot radiographs.    PMFS History: Patient Active Problem List   Diagnosis Date Noted   Hyperthyroidism 05/13/2022   Surgical follow-up care 01/06/2022   Encounter for postoperative wound check 01/06/2022   S/P CABG x 3 11/14/2021   Chest pain at rest 11/06/2021   Myopathy, unspecified 10/18/2021   Harriet Pho disease (radial styloid tenosynovitis) 09/26/2021   NSTEMI (non-ST elevated myocardial infarction) ([place]) 08/22/2021   Nausea vomiting and diarrhea 08/22/2021   AKI (acute kidney injury) ([place]) 08/22/2021   CAP (community acquired  pneumonia) 08/22/2021   Pneumonia [id number]   Chronic systolic CHF (congestive heart failure) (HCC)    Abdominal cramping    History of atherosclerotic heart disease 07/15/2021   Coronary artery disease involving native coronary artery of native heart without angina pectoris 04/19/2021   Abnormal nuclear cardiac imaging test 03/08/2021   Diabetic peripheral neuropathy ([place]) 12/06/2020   Anxiety disorder 01/27/2020   HNP (herniated nucleus pulposus), lumbar 01/13/2020   PTSD (post-traumatic stress disorder)    Polyneuropathy associated with underlying disease ([place]) 07/26/2019   Allergic rhinitis 07/26/2019   Atherosclerosis of aorta ([place]) 04/04/2019   Myalgia due to statin 11/18/2017   Depression, major, recurrent, in partial remission ([place]) 10/16/2017   Insomnia 10/16/2017   Chronic pain syndrome 09/07/2017   Spinal stenosis of lumbar region 07/08/2017   DOE (dyspnea on exertion) 12/16/2016   Family history of early CAD 12/16/2016   Arthritis of left acromioclavicular joint 08/13/2016   Left shoulder pain 05/12/2016   Cervical spondylosis without myelopathy 05/12/2016   Rotator cuff syndrome, left 05/12/2016   RLS (restless legs syndrome) 05/12/2016   Primary osteoarthritis, left hand 05/12/2016   MELENA 10/18/2008   GASTROPARESIS 05/30/2008   Type 2 diabetes mellitus with neurological complications ([place]) 0000000   HYPERCHOLESTEROLEMIA 05/04/2008   HYPOKALEMIA 05/04/2008   GERD 05/04/2008   Constipation 05/04/2008   HEMATEMESIS 05/04/2008   Essential hypertension 05/04/2008   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[place])    Anxiety    Arthritis    Diabetes mellitus without complication (HCC)    GERD (gastroesophageal reflux disease)    Heart murmur    Hyperlipidemia    Hypertension    Neuromuscular disorder (HCC)    Palpitations    Pneumonia    PONV (postoperative nausea and vomiting)    PTSD (post-traumatic stress disorder)    Ringing of  ears, left     Family History  Problem Relation Age of Onset   Diabetes Mother    Alzheimer's disease Mother    Lung cancer Father    Esophageal cancer Brother    Colon cancer Neg Hx     Past Surgical History:  Procedure Laterality Date   ABDOMINAL HYSTERECTOMY     partial   ACHILLES TENDON SURGERY Left    APPENDECTOMY     BACK SURGERY     BLADDER SUSPENSION     BREAST SURGERY Right 2003   milk duct removed   CARDIAC CATHETERIZATION     CHOLECYSTECTOMY     CORONARY ARTERY BYPASS GRAFT N/A 11/14/2021   Procedure: CORONARY ARTERY BYPASS GRAFTING (CABG) TIMES THREE USING LIMA AND CRYO VEIN.;  Surgeon: Dahlia Byes, MD;  Location: [place];  Service: Open Heart Surgery;  Laterality: N/A;   FRACTURE SURGERY     collar bone   LEFT HEART CATH AND CORONARY ANGIOGRAPHY N/A 03/08/2021   Procedure: LEFT HEART CATH AND CORONARY ANGIOGRAPHY;  Surgeon: Martinique, Peter M, MD;  Location: [facility];  Service: Cardiovascular;  Laterality: N/A;   NOSE SURGERY     x 2    OTHER SURGICAL HISTORY     Breast Duct removed   PARTIAL HYSTERECTOMY     RECTOCELE REPAIR     TEE WITHOUT CARDIOVERSION N/A 11/14/2021   Procedure: TRANSESOPHAGEAL ECHOCARDIOGRAM (TEE);  Surgeon: Dahlia Byes, MD;  Location: [place];  Service: Open Heart Surgery;  Laterality: N/A;   TONSILLECTOMY     VASCULAR SURGERY Right    right leg   VEIN SURGERY Right    leg   Social History   Occupational History   Occupation: retired    Comment: work part time  Tobacco Use   Smoking status: Never   Smokeless tobacco: Never   Tobacco comments:    Never smoke 11/01/21  Vaping Use   Vaping Use: Never used  Substance and Sexual Activity   Alcohol use: Never   Drug use: No   Sexual activity: Not on file

## 2022-06-02 ENCOUNTER — Other Ambulatory Visit: Payer: Self-pay | Admitting: Registered Nurse

## 2022-06-02 MED ORDER — GABAPENTIN 600 MG PO TABS: ORAL_TABLET | ORAL | 3 refills | Status: DC

## 2022-06-05 ENCOUNTER — Encounter (HOSPITAL_COMMUNITY): Payer: Medicare Other

## 2022-06-10 DIAGNOSIS — H353132 Nonexudative age-related macular degeneration, bilateral, intermediate dry stage: Secondary | ICD-10-CM | POA: Diagnosis not present

## 2022-06-20 ENCOUNTER — Telehealth: Payer: Self-pay | Admitting: *Deleted

## 2022-06-20 NOTE — Telephone Encounter (Signed)
U[place] drug screen for this encounter is consistent for prescribed medication 

## 2022-06-25 ENCOUNTER — Encounter (HOSPITAL_COMMUNITY): Payer: Self-pay | Admitting: Cardiology

## 2022-06-25 MED ORDER — METOPROLOL SUCCINATE ER 25 MG PO TB24: 37.5000 mg | ORAL_TABLET | Freq: Two times a day (BID) | ORAL | 3 refills | Status: DC

## 2022-06-27 ENCOUNTER — Encounter: Payer: Medicare Other | Attending: Physical Medicine & Rehabilitation | Admitting: Registered Nurse

## 2022-06-27 ENCOUNTER — Encounter: Payer: Self-pay | Admitting: Registered Nurse

## 2022-06-27 VITALS — BP 158/87 | HR 66 | Ht 68.0 in | Wt 177.8 lb

## 2022-06-27 DIAGNOSIS — Z79891 Long term (current) use of opiate analgesic: Secondary | ICD-10-CM | POA: Diagnosis not present

## 2022-06-27 DIAGNOSIS — M47812 Spondylosis without myelopathy or radiculopathy, cervical region: Secondary | ICD-10-CM | POA: Diagnosis not present

## 2022-06-27 DIAGNOSIS — G8929 Other chronic pain: Secondary | ICD-10-CM | POA: Diagnosis not present

## 2022-06-27 DIAGNOSIS — Z5181 Encounter for therapeutic drug level monitoring: Secondary | ICD-10-CM

## 2022-06-27 DIAGNOSIS — M25512 Pain in left shoulder: Secondary | ICD-10-CM | POA: Insufficient documentation

## 2022-06-27 DIAGNOSIS — R202 Paresthesia of skin: Secondary | ICD-10-CM

## 2022-06-27 DIAGNOSIS — M545 Low back pain, unspecified: Secondary | ICD-10-CM | POA: Insufficient documentation

## 2022-06-27 DIAGNOSIS — G894 Chronic pain syndrome: Secondary | ICD-10-CM

## 2022-06-27 MED ORDER — HYDROCODONE-ACETAMINOPHEN 10-325 MG PO TABS: 1.0000 | ORAL_TABLET | Freq: Four times a day (QID) | ORAL | 0 refills | Status: DC | PRN

## 2022-06-27 NOTE — Progress Notes (Signed)
Subjective:    Patient ID: [name], female    DOB: [birth year], 72 y.o.   MRN: [id number]  HPI: [name] is a 72 y.o. female who returns for follow up appointment for chronic pain and medication refill. She states her pain is located in her neck, left shoulder pain and lower extremities with tingling and numbness. She rates her pain 9. Her current exercise regime is walking and performing stretching exercises.  [name] arrived hypertensive, she is compliant with her ant-hypertensive medication. Blood Pressure was re-checked.   [name] Morphine equivalent is 40.00 MME. She  is also prescribed Clonazepam by Dr. Martinique .We have discussed the black box warning of using opioids and benzodiazepines. I highlighted the dangers of using these drugs together and discussed the adverse events including respiratory suppression, overdose, cognitive impairment and importance of compliance with current regimen. We will continue to monitor and adjust as indicated.   Last UDS was Performed on 05/06/2022, it was consistent.     Pain Inventory Average Pain 9 Pain Right Now 9 My pain is sharp, burning, dull, tingling, and aching  In the last 24 hours, has pain interfered with the following? General activity 7 Relation with others 7 Enjoyment of life 7 What TIME of day is your pain at its worst? morning , daytime, and evening Sleep (in general) Fair  Pain is worse with: bending, sitting, inactivity, standing, and some activites Pain improves with: rest, heat/ice, therapy/exercise, medication, and injections Relief from Meds: 10  Family History  Problem Relation Age of Onset   Diabetes Mother    Alzheimer's disease Mother    Lung cancer Father    Esophageal cancer Brother    Colon cancer Neg Hx    Social History   Socioeconomic History   Marital status: Widowed    Spouse name: Not on file   Number of children: 1   Years of education: Not on file   Highest education  level: 12th grade  Occupational History   Occupation: retired    Comment: work part time  Tobacco Use   Smoking status: Never   Smokeless tobacco: Never   Tobacco comments:    Never smoke 11/01/21  Vaping Use   Vaping Use: Never used  Substance and Sexual Activity   Alcohol use: Never   Drug use: No   Sexual activity: Not on file  Other Topics Concern   Not on file  Social History Narrative   Not on file   Social Determinants of Health   Financial Resource Strain: Low Risk  (01/16/2022)   Overall Financial Resource Strain (CARDIA)    Difficulty of Paying Living Expenses: Not very hard  Food Insecurity: Food Insecurity Present (01/16/2022)   Hunger Vital Sign    Worried About [place] in the Last Year: Sometimes true    Ran Out of Food in the Last Year: Sometimes true  Transportation Needs: No Transportation Needs (01/16/2022)   PRAPARE - Hydrologist (Medical): No    Lack of Transportation (Non-Medical): No  Physical Activity: Insufficiently Active (01/16/2022)   Exercise Vital Sign    Days of Exercise per Week: 3 days    Minutes of Exercise per Session: 30 min  Stress: Stress Concern Present (01/16/2022)   [place]    Feeling of Stress : To some extent  Social Connections: Moderately Integrated (01/16/2022)   Social Connection and Isolation Panel [  NHANES]    Frequency of Communication with Friends and Family: More than three times a week    Frequency of Social Gatherings with Friends and Family: Twice a week    Attends Religious Services: More than 4 times per year    Active Member of [employer] or Organizations: Yes    Attends Archivist Meetings: More than 4 times per year    Marital Status: Widowed   Past Surgical History:  Procedure Laterality Date   ABDOMINAL HYSTERECTOMY     partial   ACHILLES TENDON SURGERY Left    APPENDECTOMY     BACK SURGERY      BLADDER SUSPENSION     BREAST SURGERY Right 2003   milk duct removed   CARDIAC CATHETERIZATION     CHOLECYSTECTOMY     CORONARY ARTERY BYPASS GRAFT N/A 11/14/2021   Procedure: CORONARY ARTERY BYPASS GRAFTING (CABG) TIMES THREE USING LIMA AND CRYO VEIN.;  Surgeon: Dahlia Byes, MD;  Location: [place];  Service: Open Heart Surgery;  Laterality: N/A;   FRACTURE SURGERY     collar bone   LEFT HEART CATH AND CORONARY ANGIOGRAPHY N/A 03/08/2021   Procedure: LEFT HEART CATH AND CORONARY ANGIOGRAPHY;  Surgeon: Martinique, Peter M, MD;  Location: [place] [facility];  Service: Cardiovascular;  Laterality: N/A;   NOSE SURGERY     x 2    OTHER SURGICAL HISTORY     Breast Duct removed   PARTIAL HYSTERECTOMY     RECTOCELE REPAIR     TEE WITHOUT CARDIOVERSION N/A 11/14/2021   Procedure: TRANSESOPHAGEAL ECHOCARDIOGRAM (TEE);  Surgeon: Dahlia Byes, MD;  Location: [place];  Service: Open Heart Surgery;  Laterality: N/A;   TONSILLECTOMY     VASCULAR SURGERY Right    right leg   VEIN SURGERY Right    leg   Past Surgical History:  Procedure Laterality Date   ABDOMINAL HYSTERECTOMY     partial   ACHILLES TENDON SURGERY Left    APPENDECTOMY     BACK SURGERY     BLADDER SUSPENSION     BREAST SURGERY Right 2003   milk duct removed   CARDIAC CATHETERIZATION     CHOLECYSTECTOMY     CORONARY ARTERY BYPASS GRAFT N/A 11/14/2021   Procedure: CORONARY ARTERY BYPASS GRAFTING (CABG) TIMES THREE USING LIMA AND CRYO VEIN.;  Surgeon: Dahlia Byes, MD;  Location: [place];  Service: Open Heart Surgery;  Laterality: N/A;   FRACTURE SURGERY     collar bone   LEFT HEART CATH AND CORONARY ANGIOGRAPHY N/A 03/08/2021   Procedure: LEFT HEART CATH AND CORONARY ANGIOGRAPHY;  Surgeon: Martinique, Peter M, MD;  Location: [place] [facility];  Service: Cardiovascular;  Laterality: N/A;   NOSE SURGERY     x 2    OTHER SURGICAL HISTORY     Breast Duct removed   PARTIAL HYSTERECTOMY     RECTOCELE REPAIR     TEE WITHOUT  CARDIOVERSION N/A 11/14/2021   Procedure: TRANSESOPHAGEAL ECHOCARDIOGRAM (TEE);  Surgeon: Dahlia Byes, MD;  Location: [place];  Service: Open Heart Surgery;  Laterality: N/A;   TONSILLECTOMY     VASCULAR SURGERY Right    right leg   VEIN SURGERY Right    leg   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[place])    Anxiety    Arthritis    Diabetes mellitus without complication (HCC)    GERD (gastroesophageal reflux disease)    Heart murmur  Hyperlipidemia    Hypertension    Neuromuscular disorder (HCC)    Palpitations    Pneumonia    PONV (postoperative nausea and vomiting)    PTSD (post-traumatic stress disorder)    Ringing of ears, left    BP (!) 153/73   Pulse 76   Ht 5\' 8"  (1.727 m)   Wt 177 lb 12.8 oz (80.6 kg)   SpO2 98%   BMI 27.03 kg/m   Opioid Risk Score:   Fall Risk Score:  `1  Depression screen PHQ 2/9     05/13/2022    2:07 PM 05/06/2022    2:23 PM 01/18/2022   12:53 PM 01/07/2022    2:29 PM 10/31/2021    2:55 PM 09/04/2021    2:25 PM 06/10/2021    2:54 PM  Depression screen PHQ 2/9  Decreased Interest 0 0 0 0 0 2 0  Down, Depressed, Hopeless 0 0 0 0 0 1 3  PHQ - 2 Score 0 0 0 0 0 3 3  Altered sleeping 0  1   1   Tired, decreased energy 0  1   1   Change in appetite 0  0   1   Feeling bad or failure about yourself  0  1   0   Trouble concentrating 0  0   0   Moving slowly or fidgety/restless 0  0   0   Suicidal thoughts 0  0   0   PHQ-9 Score 0  3   6   Difficult doing work/chores Not difficult at all  Somewhat difficult   Not difficult at all      Review of Systems  Musculoskeletal:  Positive for back pain.       Bilateral foot pain Left shoulder pain Left leg pain   All other systems reviewed and are negative.     Objective:   Physical Exam Vitals and nursing note reviewed.  Constitutional:      Appearance: Normal appearance.  Cardiovascular:     Rate and Rhythm: Normal rate and regular rhythm.     Pulses: Normal  pulses.     Heart sounds: Normal heart sounds.  Pulmonary:     Effort: Pulmonary effort is normal.     Breath sounds: Normal breath sounds.  Musculoskeletal:     Cervical back: Normal range of motion and neck supple.     Comments: Normal Muscle Bulk and Muscle Testing Reveals:  Upper Extremities: Right: Full ROM and Muscle Strength 5/5 Left Upper Extremity: Decreased ROM 90 Degrees and Muscle Strength 5/5 Left AC Joint Tenderness Lumbar Paraspinal Tenderness: L-4-L-5 Lower Extremities: Full ROM and Muscle Strength 5/5 Arises from Chair with ease Narrow Based  Gait     Skin:    General: Skin is warm and dry.  Neurological:     Mental Status: She is alert and oriented to person, place, and time.  Psychiatric:        Mood and Affect: Mood normal.        Behavior: Behavior normal.         Assessment & Plan:  1.Chronic cervicalgia with documented spondylosis on MRI, per Dr. Naaman Plummer Note and Cervical Radiculitis: Continue Gabapentin,  Continue HEP as Tolerated. 06/27/2022 2. Left shoulder pain most consistent with left rotator cuff syndrome. Mild DJD.  Continue HEP as tolerated.Continue to monitor. 06/27/2022. Refilled:  Hydrocodone 10/325 mg one tablet every 6 hours as needed for pain. #120. Second script sent for the  following month. We will continue the opioid monitoring program, this consists of regular clinic visits, examinations, urine drug screen, pill counts as well as use of [place] Controlled Substance Reporting system. A 12 month History has been reviewed on the [place] Controlled Substance Reporting System on 06/27/2022. 3. Left wrist/finger pain most consistent with OA, ?post traumatic. No complaints today. 06/27/2022 4. Anxiety: Continue Klonopin, PCP prescribing. Continue to monitor. 06/27/2022 5. Restless Leg syndrome: Continue Gabapentin. 06/27/2022 7. Sacroiliac inflammation: S/P Right  SI injection on 09/20/2019, with No relief noted. Ms. Hofler seen Dr  Marlou Sa in regards to her pain. Dr Saintclair Halsted. Following.  Continue to Monitor. 06/27/2022 8. Myofascial Pain: Continue Robaxin as needed. 06/27/2022.  9. Bilateral feet with neuropathic pain: Increase Gabapentin 600 mg 4 times a day. Continue to monitor. 06/27/2022. 10.  Herniated Nucleus Pulposus L2-3 with recurrent disc rupture: S/P  Posterior Lumbar Interbody Fusion  - Lumbar two-Lumbar three on 01/13/2020 by Dr Saintclair Halsted.  Dr Saintclair Halsted Following. 06/27/2022 11. Left Ankle Pain:No complaints today.  Ortho Following. . Continue to monitor.    F/U in 2 months

## 2022-07-17 DIAGNOSIS — M5416 Radiculopathy, lumbar region: Secondary | ICD-10-CM | POA: Diagnosis not present

## 2022-07-20 ENCOUNTER — Encounter: Payer: Self-pay | Admitting: Family Medicine

## 2022-07-20 DIAGNOSIS — F32A Depression, unspecified: Secondary | ICD-10-CM

## 2022-07-20 DIAGNOSIS — F419 Anxiety disorder, unspecified: Secondary | ICD-10-CM

## 2022-07-21 MED ORDER — CLONAZEPAM 1 MG PO TABS
ORAL_TABLET | ORAL | 3 refills | Status: DC
Start: 2022-07-21 — End: 2022-12-02

## 2022-07-28 ENCOUNTER — Encounter: Payer: Self-pay | Admitting: Family Medicine

## 2022-07-28 MED ORDER — ONDANSETRON 4 MG PO TBDP: 4.0000 mg | ORAL_TABLET | Freq: Every day | ORAL | 2 refills | Status: DC | PRN

## 2022-08-04 ENCOUNTER — Encounter: Payer: Self-pay | Admitting: Family Medicine

## 2022-08-04 ENCOUNTER — Other Ambulatory Visit (HOSPITAL_COMMUNITY): Payer: Self-pay

## 2022-08-04 ENCOUNTER — Encounter (HOSPITAL_COMMUNITY): Payer: Self-pay | Admitting: Cardiology

## 2022-08-04 MED ORDER — CLOPIDOGREL BISULFATE 75 MG PO TABS: 75.0000 mg | ORAL_TABLET | Freq: Every day | ORAL | 3 refills | Status: DC

## 2022-08-04 MED ORDER — PANTOPRAZOLE SODIUM 40 MG PO TBEC: 40.0000 mg | DELAYED_RELEASE_TABLET | Freq: Every day | ORAL | 3 refills | Status: DC

## 2022-08-04 NOTE — Telephone Encounter (Signed)
Meds ordered this encounter  Medications   clopidogrel (PLAVIX) 75 MG tablet    Sig: Take 1 tablet (75 mg total) by mouth daily.    Dispense:  90 tablet    Refill:  3

## 2022-08-22 ENCOUNTER — Other Ambulatory Visit: Payer: Self-pay | Admitting: Family Medicine

## 2022-08-22 DIAGNOSIS — E876 Hypokalemia: Secondary | ICD-10-CM

## 2022-08-25 ENCOUNTER — Encounter: Payer: Self-pay | Admitting: Family Medicine

## 2022-08-25 DIAGNOSIS — G4700 Insomnia, unspecified: Secondary | ICD-10-CM

## 2022-08-25 DIAGNOSIS — F3341 Major depressive disorder, recurrent, in partial remission: Secondary | ICD-10-CM

## 2022-08-25 DIAGNOSIS — E876 Hypokalemia: Secondary | ICD-10-CM

## 2022-08-25 MED ORDER — POTASSIUM CHLORIDE CRYS ER 20 MEQ PO TBCR
20.0000 meq | EXTENDED_RELEASE_TABLET | Freq: Every day | ORAL | 5 refills | Status: DC
Start: 2022-08-25 — End: 2023-07-16

## 2022-08-26 MED ORDER — QUETIAPINE FUMARATE 100 MG PO TABS
50.0000 mg | ORAL_TABLET | Freq: Every day | ORAL | 2 refills | Status: DC
Start: 2022-08-26 — End: 2023-03-13

## 2022-08-28 ENCOUNTER — Encounter: Payer: Self-pay | Admitting: Registered Nurse

## 2022-08-28 ENCOUNTER — Encounter: Payer: Medicare Other | Attending: Physical Medicine & Rehabilitation | Admitting: Registered Nurse

## 2022-08-28 VITALS — BP 130/57 | HR 79 | Ht 68.0 in | Wt 174.2 lb

## 2022-08-28 DIAGNOSIS — Z5181 Encounter for therapeutic drug level monitoring: Secondary | ICD-10-CM | POA: Diagnosis not present

## 2022-08-28 DIAGNOSIS — M47812 Spondylosis without myelopathy or radiculopathy, cervical region: Secondary | ICD-10-CM | POA: Diagnosis not present

## 2022-08-28 DIAGNOSIS — Z79891 Long term (current) use of opiate analgesic: Secondary | ICD-10-CM | POA: Diagnosis not present

## 2022-08-28 DIAGNOSIS — R202 Paresthesia of skin: Secondary | ICD-10-CM

## 2022-08-28 DIAGNOSIS — M542 Cervicalgia: Secondary | ICD-10-CM | POA: Diagnosis not present

## 2022-08-28 DIAGNOSIS — G8929 Other chronic pain: Secondary | ICD-10-CM | POA: Diagnosis not present

## 2022-08-28 DIAGNOSIS — G894 Chronic pain syndrome: Secondary | ICD-10-CM | POA: Diagnosis not present

## 2022-08-28 DIAGNOSIS — M5412 Radiculopathy, cervical region: Secondary | ICD-10-CM | POA: Insufficient documentation

## 2022-08-28 DIAGNOSIS — M545 Low back pain, unspecified: Secondary | ICD-10-CM | POA: Diagnosis not present

## 2022-08-28 MED ORDER — HYDROCODONE-ACETAMINOPHEN 10-325 MG PO TABS: 1.0000 | ORAL_TABLET | Freq: Four times a day (QID) | ORAL | 0 refills | Status: DC | PRN

## 2022-08-28 NOTE — Progress Notes (Signed)
Subjective:    Patient ID: [name], female    DOB: 04-25-1950, 72 y.o.   MRN: [id number]  HPI ; [name] is a 72 y.o. female who returns for follow up appointment for chronic pain and medication refill. She states her pain is located in her neck radiating into her left shoulder, lower back pain and bilateral lower extremities with numbness and tingling.  She rates her pain 8. Her current exercise regime is walking and performing stretching exercises.  [name] Morphine equivalent is 37.33 MME. Sheis also prescribed Clonazepam by Dr. Swaziland .We have discussed the black box warning of using opioids and benzodiazepines. I highlighted the dangers of using these drugs together and discussed the adverse events including respiratory suppression, overdose, cognitive impairment and importance of compliance with current regimen. We will continue to monitor and adjust as indicated.    Last UDS was Performed 05/06/2022, it was consistent.     Pain Inventory Average Pain 9 Pain Right Now 8 My pain is constant, burning, tingling, and aching  In the last 24 hours, has pain interfered with the following? General activity 8 Relation with others 8 Enjoyment of life 8 What TIME of day is your pain at its worst? morning , daytime, evening, and night Sleep (in general) NA  Pain is worse with: walking, bending, sitting, inactivity, standing, and some activites Pain improves with: rest, heat/ice, therapy/exercise, pacing activities, medication, TENS, and injections Relief from Meds: 10  Family History  Problem Relation Age of Onset   Diabetes Mother    Alzheimer's disease Mother    Lung cancer Father    Esophageal cancer Brother    Colon cancer Neg Hx    Social History   Socioeconomic History   Marital status: Widowed    Spouse name: Not on file   Number of children: 1   Years of education: Not on file   Highest education level: 12th grade  Occupational History    Occupation: retired    Comment: work part time  Tobacco Use   Smoking status: Never   Smokeless tobacco: Never   Tobacco comments:    Never smoke 11/01/21  Vaping Use   Vaping Use: Never used  Substance and Sexual Activity   Alcohol use: Never   Drug use: No   Sexual activity: Not on file  Other Topics Concern   Not on file  Social History Narrative   Not on file   Social Determinants of Health   Financial Resource Strain: Low Risk  (01/16/2022)   Overall Financial Resource Strain (CARDIA)    Difficulty of Paying Living Expenses: Not very hard  Food Insecurity: Food Insecurity Present (01/16/2022)   Hunger Vital Sign    Worried About Running Out of Food in the Last Year: Sometimes true    Ran Out of Food in the Last Year: Sometimes true  Transportation Needs: No Transportation Needs (01/16/2022)   PRAPARE - Administrator, Civil Service (Medical): No    Lack of Transportation (Non-Medical): No  Physical Activity: Insufficiently Active (01/16/2022)   Exercise Vital Sign    Days of Exercise per Week: 3 days    Minutes of Exercise per Session: 30 min  Stress: Stress Concern Present (01/16/2022)   [employer] of Occupational Health - Occupational Stress Questionnaire    Feeling of Stress : To some extent  Social Connections: Moderately Integrated (01/16/2022)   Social Connection and Isolation Panel [NHANES]    Frequency of Communication  with Friends and Family: More than three times a week    Frequency of Social Gatherings with Friends and Family: Twice a week    Attends Religious Services: More than 4 times per year    Active Member of [employer] or Organizations: Yes    Attends Banker Meetings: More than 4 times per year    Marital Status: Widowed   Past Surgical History:  Procedure Laterality Date   ABDOMINAL HYSTERECTOMY     partial   ACHILLES TENDON SURGERY Left    APPENDECTOMY     BACK SURGERY     BLADDER SUSPENSION     BREAST SURGERY  Right 2003   milk duct removed   CARDIAC CATHETERIZATION     CHOLECYSTECTOMY     CORONARY ARTERY BYPASS GRAFT N/A 11/14/2021   Procedure: CORONARY ARTERY BYPASS GRAFTING (CABG) TIMES THREE USING LIMA AND CRYO VEIN.;  Surgeon: Lovett Sox, MD;  Location: [facility];  Service: Open Heart Surgery;  Laterality: N/A;   FRACTURE SURGERY     collar bone   LEFT HEART CATH AND CORONARY ANGIOGRAPHY N/A 03/08/2021   Procedure: LEFT HEART CATH AND CORONARY ANGIOGRAPHY;  Surgeon: Swaziland, Peter M, MD;  [facility];  Service: Cardiovascular;  Laterality: N/A;   NOSE SURGERY     x 2    OTHER SURGICAL HISTORY     Breast Duct removed   PARTIAL HYSTERECTOMY     RECTOCELE REPAIR     TEE WITHOUT CARDIOVERSION N/A 11/14/2021   Procedure: TRANSESOPHAGEAL ECHOCARDIOGRAM (TEE);  Surgeon: Lovett Sox, MD;  Location: [facility] OR;  Service: Open Heart Surgery;  Laterality: N/A;   TONSILLECTOMY     VASCULAR SURGERY Right    right leg   VEIN SURGERY Right    leg   Past Surgical History:  Procedure Laterality Date   ABDOMINAL HYSTERECTOMY     partial   ACHILLES TENDON SURGERY Left    APPENDECTOMY     BACK SURGERY     BLADDER SUSPENSION     BREAST SURGERY Right 2003   milk duct removed   CARDIAC CATHETERIZATION     CHOLECYSTECTOMY     CORONARY ARTERY BYPASS GRAFT N/A 11/14/2021   Procedure: CORONARY ARTERY BYPASS GRAFTING (CABG) TIMES THREE USING LIMA AND CRYO VEIN.;  Surgeon: Lovett Sox, MD;  Location: [facility];  Service: Open Heart Surgery;  Laterality: N/A;   FRACTURE SURGERY     collar bone   LEFT HEART CATH AND CORONARY ANGIOGRAPHY N/A 03/08/2021   Procedure: LEFT HEART CATH AND CORONARY ANGIOGRAPHY;  Surgeon: Swaziland, Peter M, MD;  Location: [facility] INVASIVE [facility] LAB;  Service: Cardiovascular;  Laterality: N/A;   NOSE SURGERY     x 2    OTHER SURGICAL HISTORY     Breast Duct removed   PARTIAL HYSTERECTOMY     RECTOCELE REPAIR     TEE WITHOUT CARDIOVERSION N/A 11/14/2021   Procedure:  TRANSESOPHAGEAL ECHOCARDIOGRAM (TEE);  Surgeon: Lovett Sox, MD;  Location: [facility] OR;  Service: Open Heart Surgery;  Laterality: N/A;   TONSILLECTOMY     VASCULAR SURGERY Right    right leg   VEIN SURGERY Right    leg   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HCC)    Anxiety    Arthritis    Diabetes mellitus without complication (HCC)    GERD (gastroesophageal reflux disease)    Heart murmur    Hyperlipidemia    Hypertension  Neuromuscular disorder (HCC)    Palpitations    Pneumonia    PONV (postoperative nausea and vomiting)    PTSD (post-traumatic stress disorder)    Ringing of ears, left    BP (!) 130/57   Pulse 79   Ht 5\' 8"  (1.727 m)   Wt 174 lb 3.2 oz (79 kg)   SpO2 98%   BMI 26.49 kg/m   Opioid Risk Score:   Fall Risk Score:  `1  Depression screen PHQ 2/9     08/28/2022    2:09 PM 05/13/2022    2:07 PM 05/06/2022    2:23 PM 01/18/2022   12:53 PM 01/07/2022    2:29 PM 10/31/2021    2:55 PM 09/04/2021    2:25 PM  Depression screen PHQ 2/9  Decreased Interest 0 0 0 0 0 0 2  Down, Depressed, Hopeless 0 0 0 0 0 0 1  PHQ - 2 Score 0 0 0 0 0 0 3  Altered sleeping  0  1   1  Tired, decreased energy  0  1   1  Change in appetite  0  0   1  Feeling bad or failure about yourself   0  1   0  Trouble concentrating  0  0   0  Moving slowly or fidgety/restless  0  0   0  Suicidal thoughts  0  0   0  PHQ-9 Score  0  3   6  Difficult doing work/chores  Not difficult at all  Somewhat difficult   Not difficult at all    Review of Systems  Constitutional: Negative.   HENT: Negative.    Eyes: Negative.   Respiratory: Negative.    Cardiovascular: Negative.   Gastrointestinal: Negative.   Endocrine: Negative.   Genitourinary: Negative.   Musculoskeletal:  Positive for arthralgias, back pain, gait problem, myalgias and neck pain.  Skin: Negative.   Allergic/Immunologic: Negative.   Neurological:  Positive for numbness.       Tingling/burning   Hematological: Negative.   Psychiatric/Behavioral: Negative.    All other systems reviewed and are negative.      Objective:   Physical Exam Vitals and nursing note reviewed.  Constitutional:      Appearance: Normal appearance.  Cardiovascular:     Rate and Rhythm: Normal rate and regular rhythm.     Pulses: Normal pulses.     Heart sounds: Normal heart sounds.  Pulmonary:     Effort: Pulmonary effort is normal.     Breath sounds: Normal breath sounds.  Musculoskeletal:     Cervical back: Normal range of motion and neck supple.     Comments: Normal Muscle Bulk and Muscle Testing Reveals:  Upper Extremities: Right: Full ROM and Muscle Strength 5/5 Left Upper Extremity: Decreased ROM 90 Degrees and Muscle Strength 5/5 Left AC Joint Tenderness Thoracic Hypersensitivity: T-1-T-7 Lower Extremities: Full ROM and Muscle Strength 5/5 Arises from Table slowly using cane for support Narrow Based  Gait     Skin:    General: Skin is warm and dry.  Neurological:     Mental Status: She is alert and oriented to person, place, and time.  Psychiatric:        Mood and Affect: Mood normal.        Behavior: Behavior normal.         Assessment & Plan:  1.Chronic cervicalgia with documented spondylosis on MRI, per Dr. Riley Kill Note and Cervical Radiculitis:  Continue Gabapentin,  Continue HEP as Tolerated. 08/28/2022 2. Left shoulder pain most consistent with left rotator cuff syndrome. Mild DJD.  Continue HEP as tolerated.Continue to monitor. 03523/2024. Refilled:  Hydrocodone 10/325 mg one tablet every 6 hours as needed for pain. #120. Second script sent for the following month. We will continue the opioid monitoring program, this consists of regular clinic visits, examinations, urine drug screen, pill counts as well as use of [place] Controlled Substance Reporting system. A 12 month History has been reviewed on the [place] Controlled Substance Reporting System on  08/28/2022. 3. Left wrist/finger pain most consistent with OA, ?post traumatic. No complaints today. 08/28/2022 4. Anxiety: Continue Klonopin, PCP prescribing. Continue to monitor. 08/28/2022 5. Restless Leg syndrome: Continue Gabapentin. 08/28/2022 7. Sacroiliac inflammation: S/P Right  SI injection on 09/20/2019, with No relief noted. [name] seen Dr August Saucer in regards to her pain. Dr Wynetta Emery. Following.  Continue to Monitor. 08/28/2022 8. Myofascial Pain: Continue Robaxin as needed. 08/28/2022.  9. Bilateral feet with neuropathic pain: Increase Gabapentin 600 mg 4 times a day. Continue to monitor. 08/28/2022. 10.  Herniated Nucleus Pulposus L2-3 with recurrent disc rupture: S/P  Posterior Lumbar Interbody Fusion  - Lumbar two-Lumbar three on 01/13/2020 by Dr Wynetta Emery.  Dr Wynetta Emery Following. 08/28/2022 11. Left Ankle Pain:No complaints today.  Ortho Following. . Continue to monitor. 08/28/2022   F/U in 2 months65

## 2022-09-03 ENCOUNTER — Ambulatory Visit (INDEPENDENT_AMBULATORY_CARE_PROVIDER_SITE_OTHER): Payer: Medicare Other | Admitting: Family Medicine

## 2022-09-03 VITALS — BP 144/82 | HR 66 | Temp 98.3°F | Wt 175.4 lb

## 2022-09-03 DIAGNOSIS — R0982 Postnasal drip: Secondary | ICD-10-CM

## 2022-09-03 DIAGNOSIS — R0981 Nasal congestion: Secondary | ICD-10-CM | POA: Diagnosis not present

## 2022-09-03 DIAGNOSIS — R051 Acute cough: Secondary | ICD-10-CM

## 2022-09-03 DIAGNOSIS — H0011 Chalazion right upper eyelid: Secondary | ICD-10-CM

## 2022-09-03 LAB — POC COVID19 BINAXNOW: SARS Coronavirus 2 Ag: NEGATIVE

## 2022-09-03 LAB — POCT RAPID STREP A (OFFICE): Rapid Strep A Screen: NEGATIVE

## 2022-09-03 NOTE — Progress Notes (Signed)
Established Patient Office Visit   Subjective  Patient ID: [name], female    DOB: [birth year]  Age: 72 y.o. MRN: [id number]  Chief Complaint  Patient presents with   Cough    No sore throat!    Patient is a 72 year old female followed by Dr. Swaziland seen for acute concern.  Patient endorses being frustrated that she is not seeing her PCP as she does not know this provider.  Patient wants to rule out pneumonia or bronchitis and have her feet checked.  Left foot exam 05/13/2022.  Patient states she always gets her feet checked as she has history of spinal fusion and is unable to do so on her own.  Patient endorses cough, nasal congestion, hoarse voice x 3 days.  Patient denies fever, chills, headache, sore throat, ear pain/pressure.  Concerned she will not be able to sing on Sunday.  Patient also notes a bump on right upper eyelid x several days, increasing in size.  Contacted ophthalmology however cannot be seen until next week.    Patient Active Problem List   Diagnosis Date Noted   Hyperthyroidism 05/13/2022   Surgical follow-up care 01/06/2022   Encounter for postoperative wound check 01/06/2022   S/P CABG x 3 11/14/2021   Chest pain at rest 11/06/2021   Myopathy, unspecified 10/18/2021   Suzette Battiest disease (radial styloid tenosynovitis) 09/26/2021   NSTEMI (non-ST elevated myocardial infarction) (HCC) 08/22/2021   Nausea vomiting and diarrhea 08/22/2021   AKI (acute kidney injury) (HCC) 08/22/2021   CAP (community acquired pneumonia) 08/22/2021   Pneumonia 08/22/2021   Chronic systolic CHF (congestive heart failure) (HCC)    Abdominal cramping    History of atherosclerotic heart disease 07/15/2021   Coronary artery disease involving native coronary artery of native heart without angina pectoris 04/19/2021   Abnormal nuclear cardiac imaging test 03/08/2021   Diabetic peripheral neuropathy (HCC) 12/06/2020   Anxiety disorder 01/27/2020   HNP (herniated nucleus  pulposus), lumbar 01/13/2020   PTSD (post-traumatic stress disorder)    Polyneuropathy associated with underlying disease (HCC) 07/26/2019   Allergic rhinitis 07/26/2019   Atherosclerosis of aorta (HCC) 04/04/2019   Myalgia due to statin 11/18/2017   Depression, major, recurrent, in partial remission (HCC) 10/16/2017   Insomnia 10/16/2017   Chronic pain syndrome 09/07/2017   Spinal stenosis of lumbar region 07/08/2017   DOE (dyspnea on exertion) 12/16/2016   Family history of early CAD 12/16/2016   Arthritis of left acromioclavicular joint 08/13/2016   Left shoulder pain 05/12/2016   Cervical spondylosis without myelopathy 05/12/2016   Rotator cuff syndrome, left 05/12/2016   RLS (restless legs syndrome) 05/12/2016   Primary osteoarthritis, left hand 05/12/2016   MELENA 10/18/2008   GASTROPARESIS 05/30/2008   Type 2 diabetes mellitus with neurological complications (HCC) 05/04/2008   HYPERCHOLESTEROLEMIA 05/04/2008   HYPOKALEMIA 05/04/2008   GERD 05/04/2008   Constipation 05/04/2008   HEMATEMESIS 05/04/2008   Essential hypertension 05/04/2008   Past Surgical History:  Procedure Laterality Date   ABDOMINAL HYSTERECTOMY     partial   ACHILLES TENDON SURGERY Left    APPENDECTOMY     BACK SURGERY     BLADDER SUSPENSION     BREAST SURGERY Right 2003   milk duct removed   CARDIAC CATHETERIZATION     CHOLECYSTECTOMY     CORONARY ARTERY BYPASS GRAFT N/A 11/14/2021   Procedure: CORONARY ARTERY BYPASS GRAFTING (CABG) TIMES THREE USING LIMA AND CRYO VEIN.;  Surgeon: Lovett Sox, MD;  Location: [facility] OR;  Service: Open Heart Surgery;  Laterality: N/A;   FRACTURE SURGERY     collar bone   LEFT HEART CATH AND CORONARY ANGIOGRAPHY N/A 03/08/2021   Procedure: LEFT HEART CATH AND CORONARY ANGIOGRAPHY;  Surgeon: Swaziland, Peter M, MD;  Location: [facility] INVASIVE CV LAB;  Service: Cardiovascular;  Laterality: N/A;   NOSE SURGERY     x 2    OTHER SURGICAL HISTORY     Breast Duct removed    PARTIAL HYSTERECTOMY     RECTOCELE REPAIR     TEE WITHOUT CARDIOVERSION N/A 11/14/2021   Procedure: TRANSESOPHAGEAL ECHOCARDIOGRAM (TEE);  Surgeon: Lovett Sox, MD;  Location: [facility] OR;  Service: Open Heart Surgery;  Laterality: N/A;   TONSILLECTOMY     VASCULAR SURGERY Right    right leg   VEIN SURGERY Right    leg   Social History   Tobacco Use   Smoking status: Never   Smokeless tobacco: Never   Tobacco comments:    Never smoke 11/01/21  Vaping Use   Vaping Use: Never used  Substance Use Topics   Alcohol use: Never   Drug use: No   Family History  Problem Relation Age of Onset   Diabetes Mother    Alzheimer's disease Mother    Lung cancer Father    Esophageal cancer Brother    Colon cancer Neg Hx    Allergies  Allergen Reactions   Hydromorphone Nausea And Vomiting and Other (See Comments)    Made the patient feel "spaced out,"  Other Reaction(s): GI Intolerance, Other (See Comments)  Made the patient feel "spaced out," also   Morphine And Codeine Itching    With high doses   Statins     Hx intolerance to multiple statins   Penicillins Rash    Has patient had a PCN reaction causing immediate rash, facial/tongue/throat swelling, SOB or lightheadedness with hypotension: Yes  Has patient had a PCN reaction causing severe rash involving mucus membranes or skin necrosis: No  Has patient had a PCN reaction that required hospitalization: No  Has patient had a PCN reaction occurring within the last 10 years: No  If all of the above answers are "NO", then may proceed with Cephalosporin use.  Has patient had a PCN reaction causing immediate rash, facial/tongue/throat swelling, SOB or lightheadedness with hypotension: Yes, Has patient had a PCN reaction causing severe rash involving mucus membranes or skin necrosis: No, Has patient had a PCN reaction that required hospitalization: No, Has patient had a PCN reaction occurring within the last 10 years: No, If all of the  above answers are "NO", then may proceed with Cephalosporin use.      ROS Negative unless stated above    Objective:     BP (!) 144/82 (BP Location: Left Arm, Patient Position: Sitting, Cuff Size: Normal)   Pulse 66   Temp 98.3 F (36.8 C) (Oral)   Wt 175 lb 6.4 oz (79.6 kg)   SpO2 96%   BMI 26.67 kg/m    Physical Exam Constitutional:      General: She is not in acute distress.    Appearance: Normal appearance.  HENT:     Head: Normocephalic and atraumatic.     Nose: Nose normal.     Mouth/Throat:     Mouth: Mucous membranes are moist.  Cardiovascular:     Rate and Rhythm: Normal rate and regular rhythm.     Heart sounds: Normal heart sounds. No murmur heard.    No gallop.  Pulmonary:     Effort: Pulmonary effort is normal. No respiratory distress.     Breath sounds: Normal breath sounds. No wheezing, rhonchi or rales.  Skin:    General: Skin is warm and dry.     Comments: Bilateral feet without breakdown of skin.  DP and PT.  Intact.  Monofilament testing slightly decreased.  Neurological:     Mental Status: She is alert and oriented to person, place, and time.      Results for orders placed or performed in visit on 09/03/22  POC COVID-19  Result Value Ref Range   SARS Coronavirus 2 Ag Negative Negative  POC Rapid Strep A  Result Value Ref Range   Rapid Strep A Screen Negative Negative      Assessment & Plan:  Nasal congestion -     POC COVID-19 BinaxNow -     POCT rapid strep A  Post-nasal drainage  Acute cough -     POC COVID-19 BinaxNow -     POCT rapid strep A  Chalazion of right upper eyelid  Rapid strep and COVID testing negative in clinic.  Advised symptoms likely 2/2 allergic rhinitis versus viral URI.  Supportive care encouraged.  Will try OTC Allegra.  Warm compresses 4 times daily for chalazion of right upper eyelid.  Follow-up with ophthalmology for continued or worsening symptoms.  Return if symptoms worsen or fail to improve.    Deeann Saint, MD

## 2022-09-03 NOTE — Patient Instructions (Signed)
Over-the-counter Allegra also known as fexofenadine for allergy symptoms.  You can try taking half a tab daily then if needed increase to a full tab.

## 2022-09-05 DIAGNOSIS — K080 Exfoliation of teeth due to systemic causes: Secondary | ICD-10-CM | POA: Diagnosis not present

## 2022-09-09 DIAGNOSIS — H2513 Age-related nuclear cataract, bilateral: Secondary | ICD-10-CM | POA: Diagnosis not present

## 2022-09-15 ENCOUNTER — Encounter: Payer: Self-pay | Admitting: Family Medicine

## 2022-09-17 MED ORDER — GABAPENTIN 600 MG PO TABS: ORAL_TABLET | ORAL | 3 refills | Status: DC

## 2022-09-19 ENCOUNTER — Encounter (HOSPITAL_COMMUNITY): Payer: Medicare Other | Admitting: Cardiology

## 2022-09-22 ENCOUNTER — Encounter (HOSPITAL_COMMUNITY): Payer: Self-pay

## 2022-09-22 NOTE — Therapy (Signed)
[facility] at [facility] [address] [place], [place], [address] Phone: [phone]   Fax:  [phone]  Patient Details  Name: [name] MRN: [id number] Date of Birth: 04/28/1950 Referring Provider:  No ref. provider found  Encounter Date: 09/22/2022   PHYSICAL THERAPY DISCHARGE SUMMARY  Visits from Start of Care: 2  Current functional level related to goals / functional outcomes: See below   Remaining deficits: See below   Education / Equipment: See below   Patient agrees to discharge. Patient goals were not met. Patient is being discharged due to not returning since the last visit.   11:31 AM, 09/22/22 Chanel Mckesson Small Etienne Mowers MPT [facility] [place] [phone]  [facility] at [facility] [address] [place], [place], [address] Phone: [phone]   Fax:  [phone]

## 2022-09-29 DIAGNOSIS — E119 Type 2 diabetes mellitus without complications: Secondary | ICD-10-CM | POA: Diagnosis not present

## 2022-09-29 DIAGNOSIS — H0011 Chalazion right upper eyelid: Secondary | ICD-10-CM | POA: Diagnosis not present

## 2022-09-29 DIAGNOSIS — H2513 Age-related nuclear cataract, bilateral: Secondary | ICD-10-CM | POA: Diagnosis not present

## 2022-10-02 ENCOUNTER — Inpatient Hospital Stay (HOSPITAL_COMMUNITY)
Admission: RE | Admit: 2022-10-02 | Discharge: 2022-10-02 | Disposition: A | Discharge status: Home / Self Care | Payer: Medicare Other | Source: Ambulatory Visit | Attending: Cardiology | Admitting: Cardiology

## 2022-10-02 ENCOUNTER — Ambulatory Visit (HOSPITAL_COMMUNITY)
Admission: RE | Admit: 2022-10-02 | Discharge: 2022-10-02 | Disposition: A | Discharge status: Home / Self Care | Payer: Medicare Other | Source: Ambulatory Visit | Attending: Cardiology | Admitting: Cardiology

## 2022-10-02 ENCOUNTER — Other Ambulatory Visit (HOSPITAL_COMMUNITY): Payer: Self-pay | Admitting: Cardiology

## 2022-10-02 ENCOUNTER — Other Ambulatory Visit (HOSPITAL_COMMUNITY): Payer: Self-pay

## 2022-10-02 VITALS — BP 142/88 | HR 74 | Wt 177.0 lb

## 2022-10-02 DIAGNOSIS — E785 Hyperlipidemia, unspecified: Secondary | ICD-10-CM | POA: Diagnosis not present

## 2022-10-02 DIAGNOSIS — I255 Ischemic cardiomyopathy: Secondary | ICD-10-CM | POA: Diagnosis not present

## 2022-10-02 DIAGNOSIS — I110 Hypertensive heart disease with heart failure: Secondary | ICD-10-CM | POA: Diagnosis not present

## 2022-10-02 DIAGNOSIS — I493 Ventricular premature depolarization: Secondary | ICD-10-CM | POA: Insufficient documentation

## 2022-10-02 DIAGNOSIS — I2510 Atherosclerotic heart disease of native coronary artery without angina pectoris: Secondary | ICD-10-CM | POA: Insufficient documentation

## 2022-10-02 DIAGNOSIS — I4891 Unspecified atrial fibrillation: Secondary | ICD-10-CM | POA: Diagnosis not present

## 2022-10-02 DIAGNOSIS — F411 Generalized anxiety disorder: Secondary | ICD-10-CM | POA: Insufficient documentation

## 2022-10-02 DIAGNOSIS — Z951 Presence of aortocoronary bypass graft: Secondary | ICD-10-CM | POA: Insufficient documentation

## 2022-10-02 DIAGNOSIS — Z7902 Long term (current) use of antithrombotics/antiplatelets: Secondary | ICD-10-CM | POA: Insufficient documentation

## 2022-10-02 DIAGNOSIS — Z833 Family history of diabetes mellitus: Secondary | ICD-10-CM | POA: Diagnosis not present

## 2022-10-02 DIAGNOSIS — E119 Type 2 diabetes mellitus without complications: Secondary | ICD-10-CM | POA: Insufficient documentation

## 2022-10-02 DIAGNOSIS — Z79899 Other long term (current) drug therapy: Secondary | ICD-10-CM | POA: Insufficient documentation

## 2022-10-02 DIAGNOSIS — Z7985 Long-term (current) use of injectable non-insulin antidiabetic drugs: Secondary | ICD-10-CM | POA: Insufficient documentation

## 2022-10-02 DIAGNOSIS — I5022 Chronic systolic (congestive) heart failure: Secondary | ICD-10-CM | POA: Insufficient documentation

## 2022-10-02 LAB — BASIC METABOLIC PANEL
Anion gap: 8 (ref 5–15)
BUN: 19 mg/dL (ref 8–23)
CO2: 26 mmol/L (ref 22–32)
Calcium: 9.2 mg/dL (ref 8.9–10.3)
Chloride: 107 mmol/L (ref 98–111)
Creatinine, Ser: 0.96 mg/dL (ref 0.44–1.00)
GFR, Estimated: >60 mL/min (ref >60)
Glucose, Bld: 92 mg/dL (ref 70–99)
Potassium: 4.7 mmol/L (ref 3.5–5.1)
Sodium: 141 mmol/L (ref 135–145)

## 2022-10-02 LAB — LIPID PANEL
Cholesterol: 175 mg/dL (ref 0–200)
HDL: 47 mg/dL (ref >40)
LDL Cholesterol: 72 mg/dL (ref 0–99)
Total CHOL/HDL Ratio: 3.7 RATIO
Triglycerides: 282 mg/dL — ABNORMAL HIGH (ref <150)
VLDL: 56 mg/dL — ABNORMAL HIGH (ref 0–40)

## 2022-10-02 LAB — BRAIN NATRIURETIC PEPTIDE: B Natriuretic Peptide: 211.5 pg/mL — ABNORMAL HIGH (ref 0.0–100.0)

## 2022-10-02 MED ORDER — ENTRESTO 24-26 MG PO TABS: 1.0000 | ORAL_TABLET | Freq: Two times a day (BID) | ORAL | 11 refills | Status: DC

## 2022-10-02 MED ORDER — METOPROLOL SUCCINATE ER 25 MG PO TB24: 50.0000 mg | ORAL_TABLET | Freq: Two times a day (BID) | ORAL | 3 refills | Status: DC

## 2022-10-02 NOTE — Patient Instructions (Signed)
START Entresto 24/26 mg Twice daily  Labs done today, your results will be available in MyChart, we will contact you for abnormal readings.  Repeat blood work in 10 days at [facility].  Your provider has recommended that  you wear a Zio Patch for 7 days.  This monitor will record your heart rhythm for our review.  IF you have any symptoms while wearing the monitor please press the button.  If you have any issues with the patch or you notice a red or orange light on it please call the company at [phone].  Once you remove the patch please mail it back to the company as soon as possible so we can get the results.    Your physician has requested that you have an echocardiogram. Echocardiography is a painless test that uses sound waves to create images of your heart. It provides your doctor with information about the size and shape of your heart and how well your heart's chambers and valves are working. This procedure takes approximately one hour. There are no restrictions for this procedure. Please do NOT wear cologne, perfume, aftershave, or lotions (deodorant is allowed). Please arrive 15 minutes prior to your appointment time.  Your physician recommends that you schedule a follow-up appointment in: 4 months with an echocardiogram ( October) ** please call the office in August to arrange your follow up appointment. **   If you have any questions or concerns before your next appointment please send [place] a message through [place] or call our office at [phone].    TO LEAVE A MESSAGE FOR THE NURSE SELECT OPTION 2, PLEASE LEAVE A MESSAGE INCLUDING: YOUR NAME DATE OF BIRTH CALL BACK NUMBER REASON FOR CALL**this is important as we prioritize the call backs  YOU WILL RECEIVE A CALL BACK THE SAME DAY AS LONG AS YOU CALL BEFORE 4:00 PM  At the [facility], you and your health needs are our priority. As part of our continuing mission to provide you with exceptional heart  care, we have created designated Provider Care Teams. These Care Teams include your primary Cardiologist (physician) and Advanced Practice Providers (APPs- Physician Assistants and Nurse Practitioners) who all work together to provide you with the care you need, when you need it.   You may see any of the following providers on your designated Care Team at your next follow up: Dr Arvilla Meres Dr Marca Ancona Dr. Marcos Eke, NP Robbie Lis, [place] [facility] [place], [place] Brynda Peon, NP Karle Plumber, PharmD   Please be sure to bring in all your medications bottles to every appointment.    Thank you for choosing [facility]

## 2022-10-03 ENCOUNTER — Telehealth (HOSPITAL_COMMUNITY): Payer: Self-pay | Admitting: Pharmacy Technician

## 2022-10-03 ENCOUNTER — Other Ambulatory Visit (HOSPITAL_COMMUNITY): Payer: Self-pay

## 2022-10-03 ENCOUNTER — Telehealth (HOSPITAL_COMMUNITY): Payer: Self-pay

## 2022-10-03 MED ORDER — ENTRESTO 24-26 MG PO TABS: 1.0000 | ORAL_TABLET | Freq: Two times a day (BID) | ORAL | 11 refills | Status: DC

## 2022-10-03 MED ORDER — EZETIMIBE 10 MG PO TABS: 10.0000 mg | ORAL_TABLET | Freq: Every day | ORAL | 3 refills | Status: AC

## 2022-10-03 NOTE — Telephone Encounter (Signed)
Advanced [facility] Patient Advocate Encounter  The patient was approved for a [employer] grant that will help cover the cost of Entresto, Jardiance. Total amount awarded, $10,000. Eligibility, 09/05/22 - 09/04/23.  ID [id number]  BIN [id number]  PCN [id number]  Group [id number]

## 2022-10-03 NOTE — Telephone Encounter (Addendum)
Pt aware, agreeable, and verbalized understanding  Rx Sent   ----- Message from Laurey Morale, MD sent at 10/03/2022  8:33 AM EDT ----- LDL better but still above goal of < 55.  Would recommend that she go back on Zetia to get LDL < 55 with lipid check in 2 months.  Triglycerides are also still high but we have had trouble getting her Vascepa in the past and do not want to overwhelm her with med changes.

## 2022-10-03 NOTE — Progress Notes (Signed)
ADVANCED HF CLINIC NOTE  Primary Care: [place], Betty G, MD CVTS: Dr. Donata Clay HF Cardiologist: Dr. Shirlee Latch  HPI: [name] is a 72 y.o. female w/ chronic systolic heart failure, CAD, PVCs, type 2DM, HTN and HLD.    Referred for evaluation of palpitations 6/22. Cardiac monitor showed 24% PVC burden. Echo EF 55-60%, RV normal.    11/22 she was ordered to get stress test prior to surgical clearance for back surgery. This showed medium to large defects in the apical anterior/anterior septal distribution as well as basal inferior lateral distribution which was suggestive of scar and mild peri-infarct ischemia. She was subsequently referred for [facility] 12/22 which showed severe obstructive CAD with subtotal 99% mid-LAD and complex LCx disease with severe stenoses involving the bifurication of 4 branches with CT surgery consult recommended as LCx disease was not amenable to PCI. Repeat echo showed her EF was mildly reduced at 45-50% with mild LVH, normal RV function and trivial MR. She did meet with Dr. Cliffton Asters on 03/22/2021 and by review of notes was very hesitant about proceeding with surgical intervention and was informed to make them aware with her ultimate decision about proceeding with CABG.    She went for a 2nd opinion and saw Dr. Donata Clay 4/23 who also recommended CABG, but pt remained hesitant.    Presented to ED 08/22/21 w/ CC, nausea/vomiting and diarrhea. Notes outlined that she had CP and dyspnea but pt denies this. She was found to have an AKI and diagnosed w/ colitis. Also treated for CAP. HsTn 86>>80. Level not consistent with ACS. Felt most likely demand ischemia in the setting of dehydration (vomiting/diarrhea), AKI, acute infection. Echo repeated EF 40-45% w septal apical and inferior apical hypokinesis.    She finally had CABG in 8/23 with LIMA-LAD, SVG-OM1, SVG-OM2.  She had transient post-op atrial fibrillation.   Post-op echo in 11/23 showed EF 45-50% with normal RV.  She was  started on spironolactone but she could not tolerate this due to diarrhea that started on spironolactone and resolved once she had stopped it.   She had urgent work-in from [facility] clinic 03/17/22 with HR in 40's. She felt palpitations, in our office, HR 89 on ECG with 2 PVCs. Toprol increased with frequent PVCs (and not bradycardia). Zio 7 day placed to quantify and showed PVCs down to 13.5%.   Today she returns for HF follow up. Still seems overwhelmed by her medical issues and very anxious.  Working as a Conservation officer, nature at [employer].  No exertional dyspnea or chest pain.  No lightheadedness.  Rare palpitations.  Overall, actually seems to be doing well symptomatically except for anxiety.  BP is elevated.   ECG (personally reviewed): NSR, PVC, LVH, poor RWP  Labs (7/23): K 4.4, creatinine 1.28 Labs (8/23): K 3.5, creatinine 0.99 Labs (10/23): K 3.9, creatinine 0.99 Labs (12/23): K 3.9, creatinine 0.98 Labs (2/24): TSH low but free T4 normal   PMH: 1.  Chronic systolic CHF: Ischemic cardiomyopathy.  - Echo (12/22): EF 45-50%, RV normal - Echo (5/23): EF 40-45%, RV normal  - Echo (11/23): EF 45-50%, normal RV 2. CAD: LHC (10/22) with subtotal mid LAD stenosis, complete LCx disease with severe stenoses involving bifurcation of 4 branches of the LCx.  - CABG (8/23) with LIMA-LAD, SVG-OM1, SVG-OM2 3. Atrial fibrillation: Transient post-CABG.   4. Hyperlipidemia: Unable to take statins.  5. HTN 6. Type 2 diabetes 7. PVCs: Zio monitor (8/23) with 23% PVCs - Zio 7 day (  12/23): 13.5 % PVCs 8. Generalized anxiety  Review of Systems: All systems reviewed and negative except as per HPI.   Current Outpatient Medications  Medication Sig Dispense Refill   albuterol (VENTOLIN HFA) 108 (90 Base) MCG/ACT inhaler INHALE 2 PUFFS INTO THE LUNGS EVERY 6 HOURS AS NEEDED FOR WHEEZING OR SHORTNESS OF BREATH 18 g 2   Alcohol Swabs (B-D SINGLE USE SWABS REGULAR) PADS Use to test blood sugar 3 times daily 100 each  3   Blood Glucose Monitoring Suppl (ONETOUCH VERIO FLEX SYSTEM) w/Device KIT Use to test blood sugars 1-2 times daily. 1 kit 0   clonazePAM (KLONOPIN) 1 MG tablet TAKE 1 TABLET(1 MG) BY MOUTH TWICE DAILY AS NEEDED FOR ANXIETY 60 tablet 3   clopidogrel (PLAVIX) 75 MG tablet Take 1 tablet (75 mg total) by mouth daily. 90 tablet 3   Dulaglutide (TRULICITY) 1.5 MG/0.5ML SOPN Inject 1.5 mg into the skin once a week. 13 mL 3   Evolocumab (REPATHA SURECLICK) 140 MG/ML SOAJ Inject 140 mg into the skin every 14 (fourteen) days. 6 mL 3   fluticasone (FLONASE) 50 MCG/ACT nasal spray Place 2 sprays into both nostrils daily.     furosemide (LASIX) 20 MG tablet Take 20 mg by mouth daily as needed.     gabapentin (NEURONTIN) 600 MG tablet TAKE 1 TABLET BY MOUTH EVERY MORNING AND AT NOON AND EVERY EVENING AND AT BEDTIME 120 tablet 3   guaiFENesin (MUCINEX) 600 MG 12 hr tablet Take 600 mg by mouth daily.     HYDROcodone-acetaminophen (NORCO) 10-325 MG tablet Take 1 tablet by mouth every 6 (six) hours as needed. 120 tablet 0   JARDIANCE 10 MG TABS tablet Take 1 tablet (10 mg total) by mouth daily. 90 tablet 3   Lancets (ONETOUCH ULTRASOFT) lancets Use to test blood sugar 1-2 times daily. 100 each 12   melatonin 5 MG TABS Take 1 tablet (5 mg total) by mouth at bedtime as needed.  0   multivitamin-lutein (OCUVITE-LUTEIN) CAPS capsule Take 1 capsule by mouth daily.     ondansetron (ZOFRAN-ODT) 4 MG disintegrating tablet Take 1 tablet (4 mg total) by mouth daily as needed for nausea or vomiting. 20 tablet 2   pantoprazole (PROTONIX) 40 MG tablet Take 1 tablet (40 mg total) by mouth daily. 30 tablet 3   Polyethyl Glycol-Propyl Glycol (SYSTANE) 0.4-0.3 % SOLN Place 1 drop into both eyes daily as needed (Dry eye).     potassium chloride SA (KLOR-CON M20) 20 MEQ tablet Take 1 tablet (20 mEq total) by mouth daily. 30 tablet 5   QUEtiapine (SEROQUEL) 100 MG tablet Take 0.5 tablets (50 mg total) by mouth at bedtime. 45 tablet  2   rOPINIRole (REQUIP) 0.5 MG tablet Take 1 tablet (0.5 mg total) by mouth at bedtime. 30 tablet 1   sacubitril-valsartan (ENTRESTO) 24-26 MG Take 1 tablet by mouth 2 (two) times daily. 60 tablet 11   triamcinolone (KENALOG) 0.1 % paste Use as directed 1 Application in the mouth or throat daily as needed (mouth pain).     metoprolol succinate (TOPROL XL) 25 MG 24 hr tablet Take 2 tablets (50 mg total) by mouth 2 (two) times daily. 240 tablet 3   No current facility-administered medications for this encounter.   Allergies  Allergen Reactions   Hydromorphone Nausea And Vomiting and Other (See Comments)    Made the patient feel "spaced out,"  Other Reaction(s): GI Intolerance, Other (See Comments)  Made the patient feel "spaced  out," also   Morphine And Codeine Itching    With high doses   Statins     Hx intolerance to multiple statins   Penicillins Rash    Has patient had a PCN reaction causing immediate rash, facial/tongue/throat swelling, SOB or lightheadedness with hypotension: Yes  Has patient had a PCN reaction causing severe rash involving mucus membranes or skin necrosis: No  Has patient had a PCN reaction that required hospitalization: No  Has patient had a PCN reaction occurring within the last 10 years: No  If all of the above answers are "NO", then may proceed with Cephalosporin use.  Has patient had a PCN reaction causing immediate rash, facial/tongue/throat swelling, SOB or lightheadedness with hypotension: Yes, Has patient had a PCN reaction causing severe rash involving mucus membranes or skin necrosis: No, Has patient had a PCN reaction that required hospitalization: No, Has patient had a PCN reaction occurring within the last 10 years: No, If all of the above answers are "NO", then may proceed with Cephalosporin use.   Social History   Socioeconomic History   Marital status: Widowed    Spouse name: Not on file   Number of children: 1   Years of education: Not on  file   Highest education level: 12th grade  Occupational History   Occupation: retired    Comment: work part time  Tobacco Use   Smoking status: Never   Smokeless tobacco: Never   Tobacco comments:    Never smoke 11/01/21  Vaping Use   Vaping Use: Never used  Substance and Sexual Activity   Alcohol use: Never   Drug use: No   Sexual activity: Not on file  Other Topics Concern   Not on file  Social History Narrative   Not on file   Social Determinants of Health   Financial Resource Strain: Low Risk  (01/16/2022)   Overall Financial Resource Strain (CARDIA)    Difficulty of Paying Living Expenses: Not very hard  Food Insecurity: Food Insecurity Present (01/16/2022)   Hunger Vital Sign    Worried About Running Out of Food in the Last Year: Sometimes true    Ran Out of Food in the Last Year: Sometimes true  Transportation Needs: No Transportation Needs (01/16/2022)   PRAPARE - Administrator, Civil Service (Medical): No    Lack of Transportation (Non-Medical): No  Physical Activity: Insufficiently Active (01/16/2022)   Exercise Vital Sign    Days of Exercise per Week: 3 days    Minutes of Exercise per Session: 30 min  Stress: Stress Concern Present (01/16/2022)   [employer] of Occupational Health - Occupational Stress Questionnaire    Feeling of Stress : To some extent  Social Connections: Moderately Integrated (01/16/2022)   Social Connection and Isolation Panel [NHANES]    Frequency of Communication with Friends and Family: More than three times a week    Frequency of Social Gatherings with Friends and Family: Twice a week    Attends Religious Services: More than 4 times per year    Active Member of [employer] or Organizations: Yes    Attends Banker Meetings: More than 4 times per year    Marital Status: Widowed  Intimate Partner Violence: Not At Risk (01/07/2022)   Humiliation, Afraid, Rape, and Kick questionnaire    Fear of Current or  Ex-Partner: No    Emotionally Abused: No    Physically Abused: No    Sexually Abused: No   Family History  Problem Relation Age of Onset   Diabetes Mother    Alzheimer's disease Mother    Lung cancer Father    Esophageal cancer Brother    Colon cancer Neg Hx    BP (!) 142/88   Pulse 74   Wt 80.3 kg (177 lb)   SpO2 98%   BMI 26.91 kg/m   PHYSICAL EXAM: General: NAD Neck: No JVD, no thyromegaly or thyroid nodule.  Lungs: Clear to auscultation bilaterally with normal respiratory effort. CV: Nondisplaced PMI.  Heart regular S1/S2, no S3/S4, 2/6 SEM RUSB with clear S2.  No peripheral edema.  No carotid bruit.  Normal pedal pulses.  Abdomen: Soft, nontender, no hepatosplenomegaly, no distention.  Skin: Intact without lesions or rashes.  Neurologic: Alert and oriented x 3.  Psych: Normal affect. Extremities: No clubbing or cyanosis.  HEENT: Normal.   ASSESSMENT & PLAN: 1.  Chronic Systolic Heart Failure: Ischemic cardiomyopathy.  Echo 12/22 EF 45-50% => LHC with 2 vessel CAD w/ high grade mLAD disease.  Echo 5/23 with EF 40-45%.  She had CABG x 3 in 8/23.  Post-op echo in 11/23 with EF 45-50%, normal RV.  Not volume overloaded on exam, NYHA class I symptoms.  - Continue Toprol XL 50 mg bid.  - Continue Jardiance 10 mg daily. BMET today.  - She did not tolerate spironolactone.  - With BP elevated, will start Entresto 24/26 bid with BMET/BNP today and BMET in 10 days.  - Continue Lasix PRN. - Repeat echo at followup in 4 months.  2. CAD: LHC (12/22) with 2V CAD w/ high grade mLAD disease=> pt declined CABG initially but had CABG with LIMA-LAD, SVG-OM1 and SVG-OM2 in 8/23.  No chest pain.  - Continue Plavix long-term.  - Continue ? blocker.  - Continue Repatha. Check lipids today.  3. Frequent PVCs: She has a long history of PVCs that pre-dated her CABG.  23% burden on Zio 8/23.  At last visit, only a couple of PVCs on ECG (no longer bigeminal). PVCs may arise from scar in LV from  CAD.  Concerned that very frequent PVCs could worsen her cardiomyopathy.  Repeat Zio 7 day (12/23) showed PVCs frequent but improved from previous, now 13.5%.  ? Hyperthyroidism driving ectopy (See below).  - Continue Toprol XL 50 mg bid.  - Repeat Zio monitor x 7 days to reassess PVCs.  4. Hyperlipidemia: Unable to tolerate statins.  On Repatha.   - Check lipids today.  5. Atrial fibrillation: Transient post-CABG.  Now off amiodarone. No recurrence has been seen.  - If recurs, will need anticoagulation.  6. ? Hyperthyroidism: Most recently, TSH was low but free T4 normal.  - PCP managing.  7. Generalized anxiety: Quite significant.  May benefit from SSRI.    Follow up in 4 months with echo.   Marca Ancona  10/03/2022

## 2022-10-13 DIAGNOSIS — H0011 Chalazion right upper eyelid: Secondary | ICD-10-CM | POA: Diagnosis not present

## 2022-10-13 DIAGNOSIS — E119 Type 2 diabetes mellitus without complications: Secondary | ICD-10-CM | POA: Diagnosis not present

## 2022-10-15 DIAGNOSIS — I493 Ventricular premature depolarization: Secondary | ICD-10-CM | POA: Diagnosis not present

## 2022-10-21 ENCOUNTER — Ambulatory Visit: Payer: Medicare Other | Admitting: Family Medicine

## 2022-10-21 DIAGNOSIS — M5416 Radiculopathy, lumbar region: Secondary | ICD-10-CM | POA: Diagnosis not present

## 2022-10-23 ENCOUNTER — Encounter: Payer: Self-pay | Admitting: Family Medicine

## 2022-10-23 DIAGNOSIS — G2581 Restless legs syndrome: Secondary | ICD-10-CM

## 2022-10-24 MED ORDER — ROPINIROLE HCL 0.5 MG PO TABS
0.5000 mg | ORAL_TABLET | Freq: Every day | ORAL | 5 refills | Status: DC
Start: 2022-10-24 — End: 2023-04-10

## 2022-10-27 ENCOUNTER — Telehealth (HOSPITAL_COMMUNITY): Payer: Self-pay | Admitting: Cardiology

## 2022-10-27 ENCOUNTER — Ambulatory Visit: Payer: Medicare Other | Admitting: Family Medicine

## 2022-10-27 DIAGNOSIS — I5022 Chronic systolic (congestive) heart failure: Secondary | ICD-10-CM

## 2022-10-27 DIAGNOSIS — E1149 Type 2 diabetes mellitus with other diabetic neurological complication: Secondary | ICD-10-CM

## 2022-10-27 MED ORDER — JARDIANCE 10 MG PO TABS
10.0000 mg | ORAL_TABLET | Freq: Every day | ORAL | 3 refills | Status: DC
Start: 2022-10-27 — End: 2024-01-08

## 2022-10-27 NOTE — Telephone Encounter (Signed)
Patient called to report grant expired. Co pay [phone] Updated grant info sent to pharmacy  Advanced Heart Failure Patient Advocate Encounter  The patient was approved for a [employer] grant that will help cover the cost of Entresto, Jardiance. Total amount awarded, $10,000. Eligibility, 09/05/22 - 09/04/23.  ID [id number]  BIN [id number]  PCN [id number]  Group [id number]

## 2022-10-28 ENCOUNTER — Encounter: Payer: Medicare Other | Attending: Physical Medicine & Rehabilitation | Admitting: Registered Nurse

## 2022-10-28 ENCOUNTER — Encounter: Payer: Self-pay | Admitting: Registered Nurse

## 2022-10-28 VITALS — BP 138/70 | HR 81 | Ht 68.0 in | Wt 176.0 lb

## 2022-10-28 DIAGNOSIS — M542 Cervicalgia: Secondary | ICD-10-CM | POA: Insufficient documentation

## 2022-10-28 DIAGNOSIS — M545 Low back pain, unspecified: Secondary | ICD-10-CM | POA: Insufficient documentation

## 2022-10-28 DIAGNOSIS — Z79891 Long term (current) use of opiate analgesic: Secondary | ICD-10-CM | POA: Diagnosis not present

## 2022-10-28 DIAGNOSIS — R202 Paresthesia of skin: Secondary | ICD-10-CM | POA: Diagnosis not present

## 2022-10-28 DIAGNOSIS — E1142 Type 2 diabetes mellitus with diabetic polyneuropathy: Secondary | ICD-10-CM | POA: Diagnosis not present

## 2022-10-28 DIAGNOSIS — G8929 Other chronic pain: Secondary | ICD-10-CM | POA: Insufficient documentation

## 2022-10-28 DIAGNOSIS — Z5181 Encounter for therapeutic drug level monitoring: Secondary | ICD-10-CM | POA: Diagnosis not present

## 2022-10-28 DIAGNOSIS — G894 Chronic pain syndrome: Secondary | ICD-10-CM | POA: Insufficient documentation

## 2022-10-28 DIAGNOSIS — M47812 Spondylosis without myelopathy or radiculopathy, cervical region: Secondary | ICD-10-CM | POA: Diagnosis not present

## 2022-10-28 DIAGNOSIS — M5412 Radiculopathy, cervical region: Secondary | ICD-10-CM | POA: Diagnosis not present

## 2022-10-28 MED ORDER — HYDROCODONE-ACETAMINOPHEN 10-325 MG PO TABS: 1.0000 | ORAL_TABLET | Freq: Four times a day (QID) | ORAL | 0 refills | Status: DC | PRN

## 2022-10-28 NOTE — Progress Notes (Unsigned)
Subjective:    Patient ID: [name], female    DOB: 1951/01/06, 72 y.o.   MRN: [id number]  HPI: [name] is a 72 y.o. female who returns for follow up appointment for chronic pain and medication refill. states *** pain is located in  ***. rates pain ***. current exercise regime is walking and performing stretching exercises.  [name] Morphine equivalent is *** MME.       Pain Inventory Average Pain 8 Pain Right Now 8 My pain is sharp, burning, tingling, and aching  In the last 24 hours, has pain interfered with the following? General activity 7 Relation with others 3 Enjoyment of life 7 What TIME of day is your pain at its worst? daytime and evening Sleep (in general) Fair  Pain is worse with: walking, sitting, inactivity, standing, and some activites Pain improves with: rest, heat/ice, therapy/exercise, medication, and injections Relief from Meds: 10  Family History  Problem Relation Age of Onset  . Diabetes Mother   . Alzheimer's disease Mother   . Lung cancer Father   . Esophageal cancer Brother   . Colon cancer Neg Hx    Social History   Socioeconomic History  . Marital status: Widowed    Spouse name: Not on file  . Number of children: 1  . Years of education: Not on file  . Highest education level: 12th grade  Occupational History  . Occupation: retired    Comment: work part time  Tobacco Use  . Smoking status: Never  . Smokeless tobacco: Never  . Tobacco comments:    Never smoke 11/01/21  Vaping Use  . Vaping status: Never Used  Substance and Sexual Activity  . Alcohol use: Never  . Drug use: No  . Sexual activity: Not on file  Other Topics Concern  . Not on file  Social History Narrative  . Not on file   Social Determinants of Health   Financial Resource Strain: Low Risk  (01/16/2022)   Overall Financial Resource Strain (CARDIA)   . Difficulty of Paying Living Expenses: Not very hard  Food Insecurity: Food Insecurity  Present (01/16/2022)   Hunger Vital Sign   . Worried About Programme researcher, broadcasting/film/video in the Last Year: Sometimes true   . Ran Out of Food in the Last Year: Sometimes true  Transportation Needs: No Transportation Needs (01/16/2022)   PRAPARE - Transportation   . Lack of Transportation (Medical): No   . Lack of Transportation (Non-Medical): No  Physical Activity: Insufficiently Active (01/16/2022)   Exercise Vital Sign   . Days of Exercise per Week: 3 days   . Minutes of Exercise per Session: 30 min  Stress: Stress Concern Present (01/16/2022)   [employer] of Occupational Health - Occupational Stress Questionnaire   . Feeling of Stress : To some extent  Social Connections: Moderately Integrated (01/16/2022)   Social Connection and Isolation Panel [NHANES]   . Frequency of Communication with Friends and Family: More than three times a week   . Frequency of Social Gatherings with Friends and Family: Twice a week   . Attends Religious Services: More than 4 times per year   . Active Member of Clubs or Organizations: Yes   . Attends Banker Meetings: More than 4 times per year   . Marital Status: Widowed   Past Surgical History:  Procedure Laterality Date  . ABDOMINAL HYSTERECTOMY     partial  . ACHILLES TENDON SURGERY Left   .  APPENDECTOMY    . BACK SURGERY    . BLADDER SUSPENSION    . BREAST SURGERY Right 2003   milk duct removed  . CARDIAC CATHETERIZATION    . CHOLECYSTECTOMY    . CORONARY ARTERY BYPASS GRAFT N/A 11/14/2021   Procedure: CORONARY ARTERY BYPASS GRAFTING (CABG) TIMES THREE USING LIMA AND CRYO VEIN.;  Surgeon: Lovett Sox, MD;  Location: [facility] OR;  Service: Open Heart Surgery;  Laterality: N/A;  . FRACTURE SURGERY     collar bone  . LEFT HEART CATH AND CORONARY ANGIOGRAPHY N/A 03/08/2021   Procedure: LEFT HEART CATH AND CORONARY ANGIOGRAPHY;  Surgeon: Sw[place]il[place], Peter M, MD;  Location: [facility] INVASIVE CV LAB;  Service: Cardiovascular;  Laterality: N/A;  .  NOSE SURGERY     x 2   . OTHER SURGICAL HISTORY     Breast Duct removed  . PARTIAL HYSTERECTOMY    . RECTOCELE REPAIR    . TEE WITHOUT CARDIOVERSION N/A 11/14/2021   Procedure: TRANSESOPHAGEAL ECHOCARDIOGRAM (TEE);  Surgeon: Lovett Sox, MD;  Location: [facility] OR;  Service: Open Heart Surgery;  Laterality: N/A;  . TONSILLECTOMY    . VASCULAR SURGERY Right    right leg  . VEIN SURGERY Right    leg   Past Surgical History:  Procedure Laterality Date  . ABDOMINAL HYSTERECTOMY     partial  . ACHILLES TENDON SURGERY Left   . APPENDECTOMY    . BACK SURGERY    . BLADDER SUSPENSION    . BREAST SURGERY Right 2003   milk duct removed  . CARDIAC CATHETERIZATION    . CHOLECYSTECTOMY    . CORONARY ARTERY BYPASS GRAFT N/A 11/14/2021   Procedure: CORONARY ARTERY BYPASS GRAFTING (CABG) TIMES THREE USING LIMA AND CRYO VEIN.;  Surgeon: Lovett Sox, MD;  Location: [facility] OR;  Service: Open Heart Surgery;  Laterality: N/A;  . FRACTURE SURGERY     collar bone  . LEFT HEART CATH AND CORONARY ANGIOGRAPHY N/A 03/08/2021   Procedure: LEFT HEART CATH AND CORONARY ANGIOGRAPHY;  Surgeon: [place], Peter M, MD;  Location: [facility] INVASIVE CV LAB;  Service: Cardiovascular;  Laterality: N/A;  . NOSE SURGERY     x 2   . OTHER SURGICAL HISTORY     Breast Duct removed  . PARTIAL HYSTERECTOMY    . RECTOCELE REPAIR    . TEE WITHOUT CARDIOVERSION N/A 11/14/2021   Procedure: TRANSESOPHAGEAL ECHOCARDIOGRAM (TEE);  Surgeon: Lovett Sox, MD;  Location: [facility] OR;  Service: Open Heart Surgery;  Laterality: N/A;  . TONSILLECTOMY    . VASCULAR SURGERY Right    right leg  . VEIN SURGERY Right    leg   Past Medical History:  Diagnosis Date  . 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HCC)   . Anxiety   . Arthritis   . Diabetes mellitus without complication (HCC)   . GERD (gastroesophageal reflux disease)   . Heart murmur   . Hyperlipidemia   . Hypertension   . Neuromuscular disorder (HCC)   . Palpitations   . Pneumonia   . PONV  (postoperative nausea and vomiting)   . PTSD (post-traumatic stress disorder)   . Ringing of ears, left    BP (!) 114/52   Pulse (!) 40   Ht 5\' 8"  (1.727 m)   Wt 176 lb (79.8 kg)   SpO2 98%   BMI 26.76 kg/m   Opioid Risk Score:   Fall Risk Score:  `1  Depression screen [facility] 2/9     08/28/2022  2:09 PM 05/13/2022    2:07 PM 05/06/2022    2:23 PM 01/18/2022   12:53 PM 01/07/2022    2:29 PM 10/31/2021    2:55 PM 09/04/2021    2:25 PM  Depression screen PHQ 2/9  Decreased Interest 0 0 0 0 0 0 2  Down, Depressed, Hopeless 0 0 0 0 0 0 1  PHQ - 2 Score 0 0 0 0 0 0 3  Altered sleeping  0  1   1  Tired, decreased energy  0  1   1  Change in appetite  0  0   1  Feeling bad or failure about yourself   0  1   0  Trouble concentrating  0  0   0  Moving slowly or fidgety/restless  0  0   0  Suicidal thoughts  0  0   0  PHQ-9 Score  0  3   6  Difficult doing work/chores  Not difficult at all  Somewhat difficult   Not difficult at all     Review of Systems  Musculoskeletal:  Positive for back pain and neck pain.       Bilateral lower leg pain  All other systems reviewed and are negative.     Objective:   Physical Exam Cardiovascular:     Rate and Rhythm: Rhythm irregular.     Pulses: Normal pulses.     Heart sounds: Normal heart sounds.  Pulmonary:     Effort: Pulmonary effort is normal.     Breath sounds: Normal breath sounds.  Musculoskeletal:     Comments: Normal Muscle Bulk and Muscle Testing Reveals:  Upper Extremities: ROM and Muscle Strength Thoracic, Lumbar Lower Extremities Gait     Skin:    General: Skin is warm and dry.  Neurological:     Mental Status: She is oriented to person, place, and time.  Psychiatric:        Mood and Affect: Mood normal.        Behavior: Behavior normal.         Assessment & Plan:

## 2022-10-30 NOTE — Addendum Note (Signed)
Encounter addended by: Crissie Figures, RN on: 10/30/2022 10:29 AM  Actions taken: Imaging Exam ended

## 2022-10-31 ENCOUNTER — Ambulatory Visit (HOSPITAL_COMMUNITY)
Admission: RE | Admit: 2022-10-31 | Discharge: 2022-10-31 | Disposition: A | Discharge status: Home / Self Care | Payer: Medicare Other | Source: Ambulatory Visit | Attending: Family Medicine | Admitting: Family Medicine

## 2022-10-31 DIAGNOSIS — E0590 Thyrotoxicosis, unspecified without thyrotoxic crisis or storm: Secondary | ICD-10-CM | POA: Diagnosis not present

## 2022-10-31 DIAGNOSIS — E042 Nontoxic multinodular goiter: Secondary | ICD-10-CM | POA: Diagnosis not present

## 2022-11-03 ENCOUNTER — Telehealth (HOSPITAL_COMMUNITY): Payer: Self-pay

## 2022-11-03 DIAGNOSIS — I493 Ventricular premature depolarization: Secondary | ICD-10-CM

## 2022-11-03 NOTE — Telephone Encounter (Signed)
Patient advised however she would like to discuss these results with her primary care physician on Thursday first before we place any referrals or start new medication. Patient also reports that she is mad with our office. She stated " I hope you are recording this" I advised patient that, that would be a violation of PHI/HIPAA so we do not record our phone conversations. She states she is sick of our office because we never have anything good to say. She then asked was she dying, I told her I wouldn't be the right person to answer that, she then started crying saying that's how we make her feel and that she would just talk to Dr. [place] on Thursday and hung up the phone.    Patient then called back at 5:11pm to re-discuss what we just talked about. She wanted to make sure I let you know how upset she was over the phone call. She said she is ok with starting the Mexiletine but wanted to know if she still had to take the Toprol XL. Also she wanted to let you know that the Sherryll Burger is causing joint pain.     Please advise.

## 2022-11-03 NOTE — Telephone Encounter (Signed)
-----   Message from Marca Ancona sent at 11/02/2022 11:26 PM EDT ----- Very frequent PVCs again, 18% total beats. I worry that these could be driven by hyperthyroidism (abnormal TSH in the past). Would have her get TSH, free T3, and free T4 drawn.  Please refer her to [facility] endocrinology, ask for expedited appointment. I will not start amiodarone with potential thyroid issues and I am not sure that she would tolerate a higher dose of Toprol XL. Would like her to get in to see EP for PVCs.  I will start her on mexiletine 150 mg bid.

## 2022-11-03 NOTE — Telephone Encounter (Signed)
Tell her that I'm sorry about the report from the [facility] but we have to deal with the information that we have.  Would recommend taking the mexiletine but keeping the Toprol XL at the current dose.  I would like her to see EP to help with the PVCs and would like her to followup with me soon in the office to see how she is doing on the meds.  Sherryll Burger is unlikely to cause joint pain (not a common side effect) but we can discuss when I see her.

## 2022-11-04 NOTE — Progress Notes (Signed)
HPI: [name] is a 72 y.o. female, who is here today for chronic disease management.  Last seen on 05/13/22 Since her last visit she has followed with pain management and cardiologist. She was also seen for acute illness on 09/03/22.  Today she is expressing a great deal of frustration due to heath concerns. Reports that she had another cardiac monitor , PVC's were noted and seems like problem is getting worse. So it has been recommended to see an electrophysiologist. This causes a lot of anxiety.  TSH has been abnormal, normal free T4 and anti TSH. Recently she had a thyroid US. I have not deemed necessary to see endocrinologist yet but her cardiologist thinks this problem is causing arrhythmia.  Thyroid US 11/01/22: 1.3 cm right inferior (nodule 2) and 1.0 cm left mid (nodule 5) thyroid nodules meet criteria for 1 year follow-up ultrasound. Other nodules bilaterally do not meet criteria for biopsy or dedicated follow-up.  She is struggling with affording medications. She has been started on Entresto 24-26 mg bid and Mexiletine 150 mg bid was also added but she is reluctant to take medication due to concerns about potential side effects.  TSH on 05/13/22 was 0.04. Negative for tremor or abnormal wt loss.  She is on Metoprolol Succinate 25 mg bid. HFrEF: Last echo 02/2013 LVEF 45-50% and grade I diastolic dysfunction.  She on Jardiance 10 mg daily. Negative for orthopnea or PMD. She also takes Furosemide 20 mg daily as needed. HypoK+ on KLOR  20 meq daily. Negative for unusual or severe headache, visual changes, exertional chest pain, dyspnea, focal weakness, or edema Occasional palpitations.  Lab Results  Component Value Date   NA 141 10/02/2022   K 4.7 10/02/2022   CO2 26 10/02/2022   GLUCOSE 92 10/02/2022   BUN 19 10/02/2022   CREATININE 0.96 10/02/2022   CALCIUM 9.2 10/02/2022   GFR 49.58 (L) 09/04/2021   GFRNONAA >60 10/02/2022   -Diabetes Mellitus II: Dx'ed in  2014. Checking BG at home: Not checking. Medications: Jardiance 10 mg and Trulicity 1.5 mg weekly. Negative for symptoms of hypoglycemia, polyuria, polydipsia, foot ulcers/trauma Peripheral neuropathy , burning like sensation, sometimes symptoms interfere with sleep. Hydrocodone-Acetaminophen helps. She is also on gabapentin 600 mg 4 times daily.  Lab Results  Component Value Date   HGBA1C 6.0 (A) 05/13/2022   Lab Results  Component Value Date   MICROALBUR 0.4 01/17/2022   MICROALBUR 0.8 12/27/2019   -She is also very concerned about constipation, which she describes as worsening and causing significant distress. She reports infrequent BMs described as "little balls" and feeling "thoroughly blocked" despite trying OTC bisacodyl and other laxative, most ineffective. She is frustrated as she cannot afford the cost of Linzess.   Occasional nausea, related to episodes of anxiety. Zofran 4 mg daily as needed has helped. Has been dx'ed with gastroparesis. She is on Clonazepam 0.5 mg bid and Seroquel 100 mg 1/2 tab daily at bedtime, which has helped with sleep and depression.  S/P cholecystectomy which she feels may be contributing to her GI issues.    Review of Systems  Constitutional:  Positive for fatigue. Negative for chills and fever.  HENT:  Negative for mouth sores, sore throat and trouble swallowing.   Respiratory:  Negative for cough and wheezing.   Gastrointestinal:  Positive for nausea. Negative for abdominal pain, anal bleeding and vomiting.  Endocrine: Negative for cold intolerance and heat intolerance.  Genitourinary:  Negative for decreased urine volume, dysuria  and hematuria.  Musculoskeletal:  Positive for arthralgias and back pain. Negative for gait problem.  Skin:  Negative for rash.  Neurological:  Negative for syncope and facial asymmetry.  Psychiatric/Behavioral:  Positive for sleep disturbance. Negative for confusion. The patient is nervous/anxious.   See other  pertinent positives and negatives in HPI.  Current Outpatient Medications on File Prior to Visit  Medication Sig Dispense Refill   albuterol (VENTOLIN HFA) 108 (90 Base) MCG/ACT inhaler INHALE 2 PUFFS INTO THE LUNGS EVERY 6 HOURS AS NEEDED FOR WHEEZING OR SHORTNESS OF BREATH 18 g 2   Alcohol Swabs (B-D SINGLE USE SWABS REGULAR) PADS Use to test blood sugar 3 times daily 100 each 3   Blood Glucose Monitoring Suppl (ONETOUCH VERIO FLEX SYSTEM) w/Device KIT Use to test blood sugars 1-2 times daily. 1 kit 0   clonazePAM (KLONOPIN) 1 MG tablet TAKE 1 TABLET(1 MG) BY MOUTH TWICE DAILY AS NEEDED FOR ANXIETY 60 tablet 3   clopidogrel (PLAVIX) 75 MG tablet Take 1 tablet (75 mg total) by mouth daily. 90 tablet 3   Dulaglutide (TRULICITY) 1.5 MG/0.5ML SOPN Inject 1.5 mg into the skin once a week. 13 mL 3   Evolocumab (REPATHA SURECLICK) 140 MG/ML SOAJ Inject 140 mg into the skin every 14 (fourteen) days. 6 mL 3   ezetimibe (ZETIA) 10 MG tablet Take 1 tablet (10 mg total) by mouth daily. 90 tablet 3   fluticasone (FLONASE) 50 MCG/ACT nasal spray Place 2 sprays into both nostrils daily.     furosemide (LASIX) 20 MG tablet Take 20 mg by mouth daily as needed.     gabapentin (NEURONTIN) 600 MG tablet TAKE 1 TABLET BY MOUTH EVERY MORNING AND AT NOON AND EVERY EVENING AND AT BEDTIME 120 tablet 3   guaiFENesin (MUCINEX) 600 MG 12 hr tablet Take 600 mg by mouth daily.     HYDROcodone-acetaminophen (NORCO) 10-325 MG tablet Take 1 tablet by mouth every 6 (six) hours as needed. 120 tablet 0   JARDIANCE 10 MG TABS tablet Take 1 tablet (10 mg total) by mouth daily. 90 tablet 3   Lancets (ONETOUCH ULTRASOFT) lancets Use to test blood sugar 1-2 times daily. 100 each 12   melatonin 5 MG TABS Take 1 tablet (5 mg total) by mouth at bedtime as needed.  0   metoprolol succinate (TOPROL XL) 25 MG 24 hr tablet Take 2 tablets (50 mg total) by mouth 2 (two) times daily. 240 tablet 3   mexiletine (MEXITIL) 150 MG capsule Take 1  capsule (150 mg total) by mouth 2 (two) times daily. 90 capsule 3   ondansetron (ZOFRAN-ODT) 4 MG disintegrating tablet Take 1 tablet (4 mg total) by mouth daily as needed for nausea or vomiting. 20 tablet 2   pantoprazole (PROTONIX) 40 MG tablet Take 1 tablet (40 mg total) by mouth daily. 30 tablet 3   Polyethyl Glycol-Propyl Glycol (SYSTANE) 0.4-0.3 % SOLN Place 1 drop into both eyes daily as needed (Dry eye).     potassium chloride SA (KLOR-CON M20) 20 MEQ tablet Take 1 tablet (20 mEq total) by mouth daily. 30 tablet 5   QUEtiapine (SEROQUEL) 100 MG tablet Take 0.5 tablets (50 mg total) by mouth at bedtime. 45 tablet 2   rOPINIRole (REQUIP) 0.5 MG tablet Take 1 tablet (0.5 mg total) by mouth at bedtime. 30 tablet 5   sacubitril-valsartan (ENTRESTO) 24-26 MG Take 1 tablet by mouth 2 (two) times daily. 60 tablet 11   triamcinolone (KENALOG) 0.1 % paste  Use as directed 1 Application in the mouth or throat daily as needed (mouth pain).     No current facility-administered medications on file prior to visit.   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HCC)    Anxiety    Arthritis    Diabetes mellitus without complication (HCC)    GERD (gastroesophageal reflux disease)    Heart murmur    Hyperlipidemia    Hypertension    Neuromuscular disorder (HCC)    Palpitations    Pneumonia    PONV (postoperative nausea and vomiting)    PTSD (post-traumatic stress disorder)    Ringing of ears, left    Allergies  Allergen Reactions   Hydromorphone Nausea And Vomiting and Other (See Comments)    Made the patient feel "spaced out,"  Other Reaction(s): GI Intolerance, Other (See Comments)  Made the patient feel "spaced out," also   Morphine And Codeine Itching    With high doses   Statins     Hx intolerance to multiple statins   Penicillins Rash    Has patient had a PCN reaction causing immediate rash, facial/tongue/throat swelling, SOB or lightheadedness with hypotension:  Yes  Has patient had a PCN reaction causing severe rash involving mucus membranes or skin necrosis: No  Has patient had a PCN reaction that required hospitalization: No  Has patient had a PCN reaction occurring within the last 10 years: No  If all of the above answers are "NO", then may proceed with Cephalosporin use.  Has patient had a PCN reaction causing immediate rash, facial/tongue/throat swelling, SOB or lightheadedness with hypotension: Yes, Has patient had a PCN reaction causing severe rash involving mucus membranes or skin necrosis: No, Has patient had a PCN reaction that required hospitalization: No, Has patient had a PCN reaction occurring within the last 10 years: No, If all of the above answers are "NO", then may proceed with Cephalosporin use.    Social History   Socioeconomic History   Marital status: Widowed    Spouse name: Not on file   Number of children: 1   Years of education: Not on file   Highest education level: 12th grade  Occupational History   Occupation: retired    Comment: work part time  Tobacco Use   Smoking status: Never   Smokeless tobacco: Never   Tobacco comments:    Never smoke 11/01/21  Vaping Use   Vaping status: Never Used  Substance and Sexual Activity   Alcohol use: Never   Drug use: No   Sexual activity: Not on file  Other Topics Concern   Not on file  Social History Narrative   Not on file   Social Determinants of Health   Financial Resource Strain: Low Risk  (01/16/2022)   Overall Financial Resource Strain (CARDIA)    Difficulty of Paying Living Expenses: Not very hard  Food Insecurity: Food Insecurity Present (01/16/2022)   Hunger Vital Sign    Worried About Running Out of Food in the Last Year: Sometimes true    Ran Out of Food in the Last Year: Sometimes true  Transportation Needs: No Transportation Needs (01/16/2022)   PRAPARE - Administrator, Civil Service (Medical): No    Lack of Transportation  (Non-Medical): No  Physical Activity: Insufficiently Active (01/16/2022)   Exercise Vital Sign    Days of Exercise per Week: 3 days    Minutes of Exercise per Session: 30 min  Stress: Stress Concern Present (  01/16/2022)   [place] [employer] - Occupational Stress Questionnaire    Feeling of Stress : To some extent  Social Connections: Moderately Integrated (01/16/2022)   Social Connection and Isolation Panel [NHANES]    Frequency of Communication with Friends and Family: More than three times a week    Frequency of Social Gatherings with Friends and Family: Twice a week    Attends Religious Services: More than 4 times per year    Active Member of [employer] or Organizations: Yes    Attends Banker Meetings: More than 4 times per year    Marital Status: Widowed   Today's Vitals   11/05/22 1347  BP: 136/80  Pulse: 76  Resp: 16  Temp: 98.2 F (36.8 C)  TempSrc: Oral  SpO2: 97%  Weight: 179 lb (81.2 kg)  Height: 5\' 8"  (1.727 m)   Wt Readings from Last 3 Encounters:  11/05/22 179 lb (81.2 kg)  10/28/22 176 lb (79.8 kg)  10/02/22 177 lb (80.3 kg)   Body mass index is 27.22 kg/m.  Physical Exam Vitals and nursing note reviewed.  Constitutional:      General: She is not in acute distress.    Appearance: She is well-developed.  HENT:     Head: Normocephalic and atraumatic.     Mouth/Throat:     Mouth: Mucous membranes are moist.     Pharynx: Oropharynx is clear.  Eyes:     Conjunctiva/sclera: Conjunctivae normal.  Cardiovascular:     Rate and Rhythm: Normal rate. Rhythm irregular.     Pulses:          Dorsalis pedis pulses are 2+ on the right side and 2+ on the left side.     Heart sounds: No murmur heard. Pulmonary:     Effort: Pulmonary effort is normal. No respiratory distress.     Breath sounds: Normal breath sounds.  Abdominal:     Palpations: Abdomen is soft. There is no hepatomegaly or mass.     Tenderness: There is no abdominal  tenderness.  Musculoskeletal:     Right lower leg: No edema.     Left lower leg: No edema.  Lymphadenopathy:     Cervical: No cervical adenopathy.  Skin:    General: Skin is warm.     Findings: No erythema or rash.  Neurological:     General: No focal deficit present.     Mental Status: She is alert and oriented to person, place, and time.     Cranial Nerves: No cranial nerve deficit.     Gait: Gait normal.  Psychiatric:        Mood and Affect: Mood is anxious. Affect is tearful.        Thought Content: Thought content includes suicidal ideation. Thought content does not include homicidal ideation. Thought content does not include suicidal plan.   ASSESSMENT AND PLAN:  [name] was seen today for medical management of chronic issues.  Diagnoses and all orders for this visit: Lab Results  Component Value Date   TSH 0.12 (L) 11/05/2022   Lab Results  Component Value Date   HGBA1C 6.4 11/05/2022   Type 2 diabetes mellitus with neurological complications [facility]) Assessment & Plan: Problem has been well controlled, last HgA1C in 05/2022 was 6.0. Continue Jardiance 10 mg and Trulicity 1.5 mg weekly. Annual eye exam, periodic dental and foot care to continue. F/U in 5-6 months.  Orders: -     Hemoglobin A1c; Future  Essential  hypertension Assessment & Plan: BP adequately controlled, higher than her usual, 120's/70's. Currently on Entresto and Metoprolol succinate. Continue low salt diet. Monitor BP regularly.   Hyperthyroidism Assessment & Plan: Subclinical hyperthyroidism. ? Thyroiditis. Free T4 normal and anti THS receptor ab negative. Thyroid US showed thyroid nodules, one require annual f/u with [place]. Further recommendations will be given according to lab results.  Orders: -     T4, free; Future -     T3, free; Future -     TSH; Future -     Thyrotropin receptor autoabs; Future  Constipation, unspecified constipation type Assessment & Plan: Chronic and  getting worse. OTC medications have not helped. Some of her medications can aggravate problem. She cannot afford Linzess Will try to apply for pt assistance, forms completed today and signed for Linzess 290 mcg ,ready to be faxed. We discussed some side effects. Continue adequate hydration and fiber intake. She is overdue for her colonoscopy, declined.  Orders: -     linaCLOtide; Take 1 capsule (290 mcg total) by mouth daily before breakfast.  Dispense: 90 capsule; Refill: 3  Anxiety disorder, unspecified type Assessment & Plan: Problem is not well controlled. She is under a lot of stress, afraid of new Dx's or health complications. Continue Clonazepam 0.5 mg bid. CBT has been recommended in the past.   HYPOKALEMIA Assessment & Plan: Last K+ 4.7 in 09/2022. Continue KLOR 20 meq daily.   Depression, major, recurrent, in partial remission (HCC) Assessment & Plan: Otherwise stable, exacerbated by recent health problems. Continue Seroquel 50 mg at bedtime.   PVC (premature ventricular contraction) Assessment & Plan: Cardiac monitor on 11/01/21 and 11/02/22, PVC's very frequent again, 18% total beats. This problem has been going on for a few years, gradually getting worse. Recently her THS has been abnormal, subclinical hyperthyroidism. I am not certain this is causing problems but her cardiologist has expressed concerned about this being the cause of problem. She is not interested in establishing with another provider and I think I can continue monitoring thyroid function as far as it is stable. She tells me she is not interested in taking more medications. I am repeating thyroid panel today and TSH ab, depending of results will recommend endocrinologist consultation.   I spent a total of 55 minutes in both face to face and non face to face activities for this visit on the date of this encounter. During this time history was obtained and documented, examination was performed, prior  labs/imaging reviewed, and assessment/plan discussed. Overall she expresses significant distress related to her constipation and the financial/physical burden of her medical treatments impacting her quality of life.  Return in about 4 months (around 03/07/2023).   G. [place], MD  [facility]. [place] office.

## 2022-11-05 ENCOUNTER — Encounter: Payer: Self-pay | Admitting: Family Medicine

## 2022-11-05 ENCOUNTER — Ambulatory Visit (INDEPENDENT_AMBULATORY_CARE_PROVIDER_SITE_OTHER): Payer: Medicare Other | Admitting: Family Medicine

## 2022-11-05 VITALS — BP 136/80 | HR 76 | Temp 98.2°F | Resp 16 | Ht 68.0 in | Wt 179.0 lb

## 2022-11-05 DIAGNOSIS — E1149 Type 2 diabetes mellitus with other diabetic neurological complication: Secondary | ICD-10-CM

## 2022-11-05 DIAGNOSIS — I10 Essential (primary) hypertension: Secondary | ICD-10-CM

## 2022-11-05 DIAGNOSIS — F3341 Major depressive disorder, recurrent, in partial remission: Secondary | ICD-10-CM

## 2022-11-05 DIAGNOSIS — E0590 Thyrotoxicosis, unspecified without thyrotoxic crisis or storm: Secondary | ICD-10-CM | POA: Diagnosis not present

## 2022-11-05 DIAGNOSIS — K5900 Constipation, unspecified: Secondary | ICD-10-CM | POA: Diagnosis not present

## 2022-11-05 DIAGNOSIS — Z7985 Long-term (current) use of injectable non-insulin antidiabetic drugs: Secondary | ICD-10-CM

## 2022-11-05 DIAGNOSIS — I493 Ventricular premature depolarization: Secondary | ICD-10-CM

## 2022-11-05 DIAGNOSIS — Z7984 Long term (current) use of oral hypoglycemic drugs: Secondary | ICD-10-CM

## 2022-11-05 DIAGNOSIS — F419 Anxiety disorder, unspecified: Secondary | ICD-10-CM

## 2022-11-05 DIAGNOSIS — F32A Depression, unspecified: Secondary | ICD-10-CM

## 2022-11-05 DIAGNOSIS — E876 Hypokalemia: Secondary | ICD-10-CM

## 2022-11-05 MED ORDER — MEXILETINE HCL 150 MG PO CAPS: 150.0000 mg | ORAL_CAPSULE | Freq: Two times a day (BID) | ORAL | 3 refills | Status: DC

## 2022-11-05 MED ORDER — LINACLOTIDE 290 MCG PO CAPS
290.0000 ug | ORAL_CAPSULE | Freq: Every day | ORAL | 3 refills | Status: DC
Start: 2022-11-05 — End: 2022-12-01

## 2022-11-05 NOTE — Patient Instructions (Addendum)
A few things to remember from today's visit:  Type 2 diabetes mellitus with neurological complications (HCC) - Plan: Hemoglobin A1c  Essential hypertension  Anxiety and depression  Hyperthyroidism - Plan: T4, free, T3, free, TSH, Thyrotropin receptor autoabs  Constipation, unspecified constipation type  Anxiety disorder, unspecified type  Linzess 290 mcg daily as needed . No changes today.  If you need refills for medications you take chronically, please call your pharmacy. Do not use My Chart to request refills or for acute issues that need immediate attention. If you send a my chart message, it may take a few days to be addressed, specially if I am not in the office.  Please be sure medication list is accurate. If a new problem present, please set up appointment sooner than planned today.

## 2022-11-05 NOTE — Telephone Encounter (Signed)
Spoke with patient and answered all her questions. Re-assured her that she was not dying and that the PVC's are extra beats. Referral for EP gone in, wants to hold off on Endocrinology till after she sees her pcp. Patient will send a my chart once she is ready for the referral

## 2022-11-08 NOTE — Assessment & Plan Note (Addendum)
Chronic and getting worse. OTC medications have not helped. Some of her medications can aggravate problem. She cannot afford Linzess Will try to apply for pt assistance, forms completed today and signed for Linzess 290 mcg ,ready to be faxed. We discussed some side effects. Continue adequate hydration and fiber intake. She is overdue for her colonoscopy, declined.

## 2022-11-08 NOTE — Assessment & Plan Note (Addendum)
Subclinical hyperthyroidism. ? Thyroiditis. Free T4 normal and anti THS receptor ab negative. Thyroid US showed thyroid nodules, one require annual f/u with [place]. Further recommendations will be given according to lab results.

## 2022-11-08 NOTE — Assessment & Plan Note (Signed)
BP adequately controlled, higher than her usual, 120's/70's. Currently on Entresto and Metoprolol succinate. Continue low salt diet. Monitor BP regularly.

## 2022-11-08 NOTE — Assessment & Plan Note (Addendum)
Problem is not well controlled. She is under a lot of stress, afraid of new Dx's or health complications. Continue Clonazepam 0.5 mg bid. CBT has been recommended in the past.

## 2022-11-08 NOTE — Assessment & Plan Note (Signed)
Last K+ 4.7 in 09/2022. Continue KLOR 20 meq daily.

## 2022-11-08 NOTE — Assessment & Plan Note (Signed)
Otherwise stable, exacerbated by recent health problems. Continue Seroquel 50 mg at bedtime.

## 2022-11-08 NOTE — Assessment & Plan Note (Signed)
Cardiac monitor on 11/01/21 and 11/02/22, PVC's very frequent again, 18% total beats. This problem has been going on for a few years, gradually getting worse. Recently her THS has been abnormal, subclinical hyperthyroidism. I am not certain this is causing problems but her cardiologist has expressed concerned about this being the cause of problem. She is not interested in establishing with another provider and I think I can continue monitoring thyroid function as far as it is stable. She tells me she is not interested in taking more medications. I am repeating thyroid panel today and TSH ab, depending of results will recommend endocrinologist consultation.

## 2022-11-08 NOTE — Assessment & Plan Note (Signed)
Problem has been well controlled, last HgA1C in 05/2022 was 6.0. Continue Jardiance 10 mg and Trulicity 1.5 mg weekly. Annual eye exam, periodic dental and foot care to continue. F/U in 5-6 months.

## 2022-11-17 ENCOUNTER — Encounter: Payer: Self-pay | Admitting: Family Medicine

## 2022-11-17 ENCOUNTER — Encounter (HOSPITAL_COMMUNITY): Payer: Self-pay | Admitting: Cardiology

## 2022-11-17 DIAGNOSIS — L570 Actinic keratosis: Secondary | ICD-10-CM | POA: Diagnosis not present

## 2022-11-17 DIAGNOSIS — L723 Sebaceous cyst: Secondary | ICD-10-CM | POA: Diagnosis not present

## 2022-11-19 MED ORDER — METOPROLOL SUCCINATE ER 25 MG PO TB24: 75.0000 mg | ORAL_TABLET | Freq: Two times a day (BID) | ORAL | 3 refills | Status: DC

## 2022-11-25 ENCOUNTER — Other Ambulatory Visit: Payer: Self-pay | Admitting: Family Medicine

## 2022-11-30 ENCOUNTER — Encounter: Payer: Self-pay | Admitting: Family Medicine

## 2022-12-01 ENCOUNTER — Encounter: Payer: Self-pay | Admitting: Family Medicine

## 2022-12-01 ENCOUNTER — Ambulatory Visit (INDEPENDENT_AMBULATORY_CARE_PROVIDER_SITE_OTHER): Payer: Medicare Other | Admitting: Family Medicine

## 2022-12-01 VITALS — BP 135/80 | HR 82 | Resp 16 | Ht 68.0 in

## 2022-12-01 DIAGNOSIS — M545 Low back pain, unspecified: Secondary | ICD-10-CM | POA: Diagnosis not present

## 2022-12-01 DIAGNOSIS — E7800 Pure hypercholesterolemia, unspecified: Secondary | ICD-10-CM

## 2022-12-01 DIAGNOSIS — I10 Essential (primary) hypertension: Secondary | ICD-10-CM

## 2022-12-01 DIAGNOSIS — R109 Unspecified abdominal pain: Secondary | ICD-10-CM | POA: Diagnosis not present

## 2022-12-01 DIAGNOSIS — I5022 Chronic systolic (congestive) heart failure: Secondary | ICD-10-CM

## 2022-12-01 DIAGNOSIS — E0590 Thyrotoxicosis, unspecified without thyrotoxic crisis or storm: Secondary | ICD-10-CM

## 2022-12-01 DIAGNOSIS — F419 Anxiety disorder, unspecified: Secondary | ICD-10-CM

## 2022-12-01 LAB — POC URINALSYSI DIPSTICK (AUTOMATED)
Bilirubin, UA: NEGATIVE
Blood, UA: NEGATIVE
Glucose, UA: POSITIVE — AB
Ketones, UA: NEGATIVE
Leukocytes, UA: NEGATIVE
Nitrite, UA: NEGATIVE
Protein, UA: NEGATIVE
Spec Grav, UA: 1.02 (ref 1.010–1.025)
Urobilinogen, UA: 0.2 E.U./dL
pH, UA: 5 (ref 5.0–8.0)

## 2022-12-01 NOTE — Progress Notes (Unsigned)
ACUTE VISIT Chief Complaint  Patient presents with   Flank Pain    LRQ pain x3 days   HPI: [name] is a 72 y.o. female, who is here today complaining of *** Back Pain This is a new problem. The problem is unchanged. The pain is severe. The symptoms are aggravated by bending, position, standing and twisting. Pertinent negatives include no abdominal pain, bladder incontinence, bowel incontinence, chest pain, dysuria, fever, pelvic pain or perianal numbness. She has tried analgesics for the symptoms.   Lab Results  Component Value Date   TSH 0.12 (L) 11/05/2022    Review of Systems  Constitutional:  Negative for fever.  Cardiovascular:  Negative for chest pain.  Gastrointestinal:  Negative for abdominal pain and bowel incontinence.  Genitourinary:  Negative for bladder incontinence, dysuria and pelvic pain.  Musculoskeletal:  Positive for back pain.  See other pertinent positives and negatives in HPI.  Current Outpatient Medications on File Prior to Visit  Medication Sig Dispense Refill   albuterol (VENTOLIN HFA) 108 (90 Base) MCG/ACT inhaler INHALE 2 PUFFS INTO THE LUNGS EVERY 6 HOURS AS NEEDED FOR WHEEZING OR SHORTNESS OF BREATH 18 g 2   Alcohol Swabs (B-D SINGLE USE SWABS REGULAR) PADS Use to test blood sugar 3 times daily 100 each 3   Blood Glucose Monitoring Suppl (ONETOUCH VERIO FLEX SYSTEM) w/Device KIT Use to test blood sugars 1-2 times daily. 1 kit 0   clonazePAM (KLONOPIN) 1 MG tablet TAKE 1 TABLET(1 MG) BY MOUTH TWICE DAILY AS NEEDED FOR ANXIETY 60 tablet 3   clopidogrel (PLAVIX) 75 MG tablet Take 1 tablet (75 mg total) by mouth daily. 90 tablet 3   Dulaglutide (TRULICITY) 1.5 MG/0.5ML SOPN Inject 1.5 mg into the skin once a week. 13 mL 3   Evolocumab (REPATHA SURECLICK) 140 MG/ML SOAJ Inject 140 mg into the skin every 14 (fourteen) days. 6 mL 3   ezetimibe (ZETIA) 10 MG tablet Take 1 tablet (10 mg total) by mouth daily. 90 tablet 3   fluticasone (FLONASE)  50 MCG/ACT nasal spray Place 2 sprays into both nostrils daily.     furosemide (LASIX) 20 MG tablet Take 20 mg by mouth daily as needed.     gabapentin (NEURONTIN) 600 MG tablet TAKE 1 TABLET BY MOUTH EVERY MORNING AND AT NOON AND EVERY EVENING AND AT BEDTIME 120 tablet 3   guaiFENesin (MUCINEX) 600 MG 12 hr tablet Take 600 mg by mouth daily.     HYDROcodone-acetaminophen (NORCO) 10-325 MG tablet Take 1 tablet by mouth every 6 (six) hours as needed. 120 tablet 0   JARDIANCE 10 MG TABS tablet Take 1 tablet (10 mg total) by mouth daily. 90 tablet 3   Lancets (ONETOUCH ULTRASOFT) lancets Use to test blood sugar 1-2 times daily. 100 each 12   melatonin 5 MG TABS Take 1 tablet (5 mg total) by mouth at bedtime as needed.  0   metoprolol succinate (TOPROL XL) 25 MG 24 hr tablet Take 3 tablets (75 mg total) by mouth 2 (two) times daily. 180 tablet 3   ondansetron (ZOFRAN-ODT) 4 MG disintegrating tablet Take 1 tablet (4 mg total) by mouth daily as needed for nausea or vomiting. 20 tablet 2   pantoprazole (PROTONIX) 40 MG tablet Take 1 tablet by mouth once daily 90 tablet 3   Polyethyl Glycol-Propyl Glycol (SYSTANE) 0.4-0.3 % SOLN Place 1 drop into both eyes daily as needed (Dry eye).     potassium chloride SA (KLOR-CON M20)  20 MEQ tablet Take 1 tablet (20 mEq total) by mouth daily. 30 tablet 5   QUEtiapine (SEROQUEL) 100 MG tablet Take 0.5 tablets (50 mg total) by mouth at bedtime. 45 tablet 2   rOPINIRole (REQUIP) 0.5 MG tablet Take 1 tablet (0.5 mg total) by mouth at bedtime. 30 tablet 5   sacubitril-valsartan (ENTRESTO) 24-26 MG Take 1 tablet by mouth 2 (two) times daily. 60 tablet 11   triamcinolone (KENALOG) 0.1 % paste Use as directed 1 Application in the mouth or throat daily as needed (mouth pain).     No current facility-administered medications on file prior to visit.    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HCC)    Anxiety    Arthritis    Diabetes mellitus without  complication (HCC)    GERD (gastroesophageal reflux disease)    Heart murmur    Hyperlipidemia    Hypertension    Neuromuscular disorder (HCC)    Palpitations    Pneumonia    PONV (postoperative nausea and vomiting)    PTSD (post-traumatic stress disorder)    Ringing of ears, left    Allergies  Allergen Reactions   Hydromorphone Nausea And Vomiting and Other (See Comments)    Made the patient feel "spaced out,"  Other Reaction(s): GI Intolerance, Other (See Comments)  Made the patient feel "spaced out," also   Morphine And Codeine Itching    With high doses   Statins     Hx intolerance to multiple statins   Penicillins Rash    Has patient had a PCN reaction causing immediate rash, facial/tongue/throat swelling, SOB or lightheadedness with hypotension: Yes  Has patient had a PCN reaction causing severe rash involving mucus membranes or skin necrosis: No  Has patient had a PCN reaction that required hospitalization: No  Has patient had a PCN reaction occurring within the last 10 years: No  If all of the above answers are "NO", then may proceed with Cephalosporin use.  Has patient had a PCN reaction causing immediate rash, facial/tongue/throat swelling, SOB or lightheadedness with hypotension: Yes, Has patient had a PCN reaction causing severe rash involving mucus membranes or skin necrosis: No, Has patient had a PCN reaction that required hospitalization: No, Has patient had a PCN reaction occurring within the last 10 years: No, If all of the above answers are "NO", then may proceed with Cephalosporin use.    Social History   Socioeconomic History   Marital status: Widowed    Spouse name: Not on file   Number of children: 1   Years of education: Not on file   Highest education level: 12th grade  Occupational History   Occupation: retired    Comment: work part time  Tobacco Use   Smoking status: Never   Smokeless tobacco: Never   Tobacco comments:    Never smoke  11/01/21  Vaping Use   Vaping status: Never Used  Substance and Sexual Activity   Alcohol use: Never   Drug use: No   Sexual activity: Not on file  Other Topics Concern   Not on file  Social History Narrative   Not on file   Social Determinants of Health   Financial Resource Strain: Low Risk  (01/16/2022)   Overall Financial Resource Strain (CARDIA)    Difficulty of Paying Living Expenses: Not very hard  Food Insecurity: Food Insecurity Present (01/16/2022)   Hunger Vital Sign    Worried About Running Out of Food in the  Last Year: Sometimes true    Ran Out of Food in the Last Year: Sometimes true  Transportation Needs: No Transportation Needs (01/16/2022)   PRAPARE - Administrator, Civil Service (Medical): No    Lack of Transportation (Non-Medical): No  Physical Activity: Insufficiently Active (01/16/2022)   Exercise Vital Sign    Days of Exercise per Week: 3 days    Minutes of Exercise per Session: 30 min  Stress: Stress Concern Present (01/16/2022)   [employer] of Occupational Health - Occupational Stress Questionnaire    Feeling of Stress : To some extent  Social Connections: Moderately Integrated (01/16/2022)   Social Connection and Isolation Panel [NHANES]    Frequency of Communication with Friends and Family: More than three times a week    Frequency of Social Gatherings with Friends and Family: Twice a week    Attends Religious Services: More than 4 times per year    Active Member of [employer] or Organizations: Yes    Attends Banker Meetings: More than 4 times per year    Marital Status: Widowed    Vitals:   12/01/22 1534 12/01/22 1603  BP: (!) 158/90 135/80  Pulse: 82   Resp: 16   SpO2: 98%    Body mass index is 27.22 kg/m.  Physical Exam Vitals and nursing note reviewed.  Constitutional:      General: She is not in acute distress.    Appearance: She is well-developed. She is not ill-appearing.  HENT:     Head:  Normocephalic and atraumatic.  Eyes:     Conjunctiva/sclera: Conjunctivae normal.  Cardiovascular:     Rate and Rhythm: Normal rate and regular rhythm.     Heart sounds: No murmur heard. Pulmonary:     Effort: Pulmonary effort is normal. No respiratory distress.     Breath sounds: Normal breath sounds.  Abdominal:     Palpations: Abdomen is soft. There is no hepatomegaly or mass.     Tenderness: There is no abdominal tenderness.  Musculoskeletal:     Lumbar back: Tenderness present.       Back:     Comments: Pain elicited with movement/walking.   Lymphadenopathy:     Cervical: No cervical adenopathy.     Upper Body:     Right upper body: No supraclavicular adenopathy.  Skin:    General: Skin is warm.     Findings: No erythema or rash.  Neurological:     General: No focal deficit present.     Mental Status: She is alert and oriented to person, place, and time.     Deep Tendon Reflexes:     Reflex Scores:      Patellar reflexes are 2+ on the right side and 2+ on the left side.    Comments: *** gait.  Psychiatric:        Mood and Affect: Mood is anxious. Affect is tearful.     Comments: Well groomed, good eye contact.    ASSESSMENT AND PLAN: Flank pain -     POCT Urinalysis Dipstick (Automated)  Acute right-sided low back pain without sciatica  HYPERCHOLESTEROLEMIA Assessment & Plan: She has discontinued Repatha, no side effects when she she was taking it. She has not tolerated stains. Currently on Zetia 10 mg daily. We discussed CV benefits of lowering LDL, recommend resuming Repatha. Follows with cardiologist.   Hyperthyroidism Assessment & Plan: Last TSH still abnormal but slightly better, normal free T4 and T3. We decided to  hold on endocrinology referral. TSH and free T3 in 6-8 weeks.  Orders: -     TSH; Future -     T4, free; Future  Essential hypertension  Anxiety disorder, unspecified type Assessment & Plan: Exacerbated by health  problems. Continue Clonazepam 0.5 mg bid.   Chronic systolic CHF (congestive heart failure) (HCC) Assessment & Plan: She is not taking Entresto, caused LE aches.Recommend to address it with her cardiologist. Some side effects discussed. Last K+ 4.7. Currently on Jardiance. Following with cardiologist.     I spent a total of 42 minutes in both face to face and non face to face activities for this visit on the date of this encounter. During this time history was obtained and documented, examination was performed, prior labs/imaging reviewed***, and assessment/plan discussed.  Return for Labs, keep next appointment.  Latonga Ponder G. [place], MD  [facility]. [place] office.

## 2022-12-01 NOTE — Assessment & Plan Note (Signed)
She has discontinued Repatha, no side effects when she she was taking it. She has not tolerated stains. Currently on Zetia 10 mg daily. We discussed CV benefits of lowering LDL, recommend resuming Repatha. Follows with cardiologist.

## 2022-12-01 NOTE — Assessment & Plan Note (Signed)
Exacerbated by health problems. Continue Clonazepam 0.5 mg bid.

## 2022-12-01 NOTE — Assessment & Plan Note (Signed)
Last TSH still abnormal but slightly better, went from 0.04 to 0.12 and normal free T4 and T3. Anti TSH ab have been negative. ? Thyroiditis. We decided to hold on endocrinology referral for now. TSH and free T3 in 6-8 weeks and will continue close monitoring until normalization.If at any time TSH starts declining again, endocrinology appt will be arranged.

## 2022-12-01 NOTE — Patient Instructions (Addendum)
A few things to remember from today's visit:  Flank pain - Plan: POCT Urinalysis Dipstick (Automated)  Acute right-sided low back pain without sciatica  HYPERCHOLESTEROLEMIA  Hyperthyroidism  Essential hypertension Try Repatha again. Lidocaine patches to try. Call ypou back specialist pof pain continues. Thyroid labs in 6-8 weeks.   If you need refills for medications you take chronically, please call your pharmacy. Do not use My Chart to request refills or for acute issues that need immediate attention. If you send a my chart message, it may take a few days to be addressed, specially if I am not in the office.  Please be sure medication list is accurate. If a new problem present, please set up appointment sooner than planned today.

## 2022-12-01 NOTE — Assessment & Plan Note (Signed)
She is not taking Entresto, caused LE aches.Recommend to address it with her cardiologist. Some side effects discussed. Last K+ 4.7. Currently on Jardiance. Following with cardiologist.

## 2022-12-02 ENCOUNTER — Other Ambulatory Visit: Payer: Self-pay

## 2022-12-02 DIAGNOSIS — F419 Anxiety disorder, unspecified: Secondary | ICD-10-CM

## 2022-12-02 MED ORDER — CLONAZEPAM 1 MG PO TABS
ORAL_TABLET | ORAL | 3 refills | Status: DC
Start: 2022-12-02 — End: 2023-04-24

## 2022-12-04 NOTE — Assessment & Plan Note (Signed)
Re-checked 135/80. Continue Metoprolol succinate 75 mg bid and low salt diet. Recommend monitoring BP at home.

## 2022-12-08 ENCOUNTER — Encounter (HOSPITAL_COMMUNITY): Payer: Self-pay | Admitting: Cardiology

## 2022-12-08 ENCOUNTER — Encounter: Payer: Self-pay | Admitting: Family Medicine

## 2022-12-08 DIAGNOSIS — K2100 Gastro-esophageal reflux disease with esophagitis, without bleeding: Secondary | ICD-10-CM

## 2022-12-09 MED ORDER — PANTOPRAZOLE SODIUM 40 MG PO TBEC
40.0000 mg | DELAYED_RELEASE_TABLET | Freq: Every day | ORAL | 3 refills | Status: DC
Start: 2022-12-09 — End: 2023-11-30

## 2022-12-24 ENCOUNTER — Encounter: Payer: Self-pay | Admitting: Family Medicine

## 2022-12-24 DIAGNOSIS — J989 Respiratory disorder, unspecified: Secondary | ICD-10-CM

## 2022-12-24 MED ORDER — ALBUTEROL SULFATE HFA 108 (90 BASE) MCG/ACT IN AERS
2.0000 | INHALATION_SPRAY | Freq: Four times a day (QID) | RESPIRATORY_TRACT | 2 refills | Status: DC | PRN
Start: 2022-12-24 — End: 2023-12-28

## 2022-12-25 ENCOUNTER — Encounter: Payer: Self-pay | Admitting: Registered Nurse

## 2022-12-25 ENCOUNTER — Encounter: Payer: Medicare Other | Attending: Physical Medicine & Rehabilitation | Admitting: Registered Nurse

## 2022-12-25 VITALS — BP 123/73 | HR 81 | Ht 69.0 in | Wt 177.0 lb

## 2022-12-25 DIAGNOSIS — Z79891 Long term (current) use of opiate analgesic: Secondary | ICD-10-CM | POA: Diagnosis not present

## 2022-12-25 DIAGNOSIS — M545 Low back pain, unspecified: Secondary | ICD-10-CM | POA: Diagnosis not present

## 2022-12-25 DIAGNOSIS — G894 Chronic pain syndrome: Secondary | ICD-10-CM | POA: Diagnosis not present

## 2022-12-25 DIAGNOSIS — M5412 Radiculopathy, cervical region: Secondary | ICD-10-CM | POA: Diagnosis not present

## 2022-12-25 DIAGNOSIS — M47812 Spondylosis without myelopathy or radiculopathy, cervical region: Secondary | ICD-10-CM | POA: Diagnosis not present

## 2022-12-25 DIAGNOSIS — R202 Paresthesia of skin: Secondary | ICD-10-CM | POA: Diagnosis not present

## 2022-12-25 DIAGNOSIS — Z5181 Encounter for therapeutic drug level monitoring: Secondary | ICD-10-CM | POA: Diagnosis not present

## 2022-12-25 DIAGNOSIS — G8929 Other chronic pain: Secondary | ICD-10-CM | POA: Diagnosis not present

## 2022-12-25 DIAGNOSIS — M542 Cervicalgia: Secondary | ICD-10-CM | POA: Diagnosis not present

## 2022-12-25 MED ORDER — HYDROCODONE-ACETAMINOPHEN 10-325 MG PO TABS: 1.0000 | ORAL_TABLET | Freq: Four times a day (QID) | ORAL | 0 refills | Status: DC | PRN

## 2022-12-25 NOTE — Progress Notes (Signed)
Subjective:    Patient ID: [name], female    DOB: November 04, 1950, 72 y.o.   MRN: [id number]  HPI: [name] is a 72 y.o. female who returns for follow up appointment for chronic pain and medication refill. She states her pain is located in her neck radiating into her left shoulder, lower back pain and bilateral lower extremities and bilateral lower extremities with tingling and numbness. She rates her pain 7. Her current exercise regime is walking and performing stretching exercises.  Ms. Basa feeling emotional she reports with her health and pain, emotional support given, she denies any suicidal ideation.   Ms. Jory Morphine equivalent is 40.00 MME.She is also prescribed Clonazepam by Dr. [place] .We have discussed the black box warning of using opioids and benzodiazepines. I highlighted the dangers of using these drugs together and discussed the adverse events including respiratory suppression, overdose, cognitive impairment and importance of compliance with current regimen. We will continue to monitor and adjust as indicated.   Last UDS was Performed on 10/28/2022, it was consistent.    Pain Inventory Average Pain 8 Pain Right Now 7 My pain is constant, burning, tingling, and aching  In the last 24 hours, has pain interfered with the following? General activity 8 Relation with others 7 Enjoyment of life 8 What TIME of day is your pain at its worst? daytime and evening Sleep (in general) Fair  Pain is worse with: walking, bending, inactivity, standing, and some activites Pain improves with: rest, pacing activities, medication, and injections Relief from Meds: 10  Family History  Problem Relation Age of Onset   Diabetes Mother    Alzheimer's disease Mother    Lung cancer Father    Esophageal cancer Brother    Colon cancer Neg Hx    Social History   Socioeconomic History   Marital status: Widowed    Spouse name: Not on file   Number of children: 1    Years of education: Not on file   Highest education level: 12th grade  Occupational History   Occupation: retired    Comment: work part time  Tobacco Use   Smoking status: Never   Smokeless tobacco: Never   Tobacco comments:    Never smoke 11/01/21  Vaping Use   Vaping status: Never Used  Substance and Sexual Activity   Alcohol use: Never   Drug use: No   Sexual activity: Not on file  Other Topics Concern   Not on file  Social History Narrative   Not on file   Social Determinants of Health   Financial Resource Strain: Low Risk  (01/16/2022)   Overall Financial Resource Strain (CARDIA)    Difficulty of Paying Living Expenses: Not very hard  Food Insecurity: Food Insecurity Present (01/16/2022)   Hunger Vital Sign    Worried About Running Out of Food in the Last Year: Sometimes true    Ran Out of Food in the Last Year: Sometimes true  Transportation Needs: No Transportation Needs (01/16/2022)   PRAPARE - Administrator, Civil Service (Medical): No    Lack of Transportation (Non-Medical): No  Physical Activity: Insufficiently Active (01/16/2022)   Exercise Vital Sign    Days of Exercise per Week: 3 days    Minutes of Exercise per Session: 30 min  Stress: Stress Concern Present (01/16/2022)   [employer] of Occupational Health - Occupational Stress Questionnaire    Feeling of Stress : To some extent  Social Connections: Moderately Integrated (  01/16/2022)   Social Connection and Isolation Panel [NHANES]    Frequency of Communication with Friends and Family: More than three times a week    Frequency of Social Gatherings with Friends and Family: Twice a week    Attends Religious Services: More than 4 times per year    Active Member of [employer] or Organizations: Yes    Attends Banker Meetings: More than 4 times per year    Marital Status: Widowed   Past Surgical History:  Procedure Laterality Date   ABDOMINAL HYSTERECTOMY     partial    ACHILLES TENDON SURGERY Left    APPENDECTOMY     BACK SURGERY     BLADDER SUSPENSION     BREAST SURGERY Right 2003   milk duct removed   CARDIAC CATHETERIZATION     CHOLECYSTECTOMY     CORONARY ARTERY BYPASS GRAFT N/A 11/14/2021   Procedure: CORONARY ARTERY BYPASS GRAFTING (CABG) TIMES THREE USING LIMA AND CRYO VEIN.;  Surgeon: Lovett Sox, MD;  Location: [facility] OR;  Service: Open Heart Surgery;  Laterality: N/A;   FRACTURE SURGERY     collar bone   LEFT HEART CATH AND CORONARY ANGIOGRAPHY N/A 03/08/2021   Procedure: LEFT HEART CATH AND CORONARY ANGIOGRAPHY;  Surgeon: Swaziland, Peter M, MD;  Location: [facility] INVASIVE CV LAB;  Service: Cardiovascular;  Laterality: N/A;   NOSE SURGERY     x 2    OTHER SURGICAL HISTORY     Breast Duct removed   PARTIAL HYSTERECTOMY     RECTOCELE REPAIR     TEE WITHOUT CARDIOVERSION N/A 11/14/2021   Procedure: TRANSESOPHAGEAL ECHOCARDIOGRAM (TEE);  Surgeon: Lovett Sox, MD;  Location: [facility] OR;  Service: Open Heart Surgery;  Laterality: N/A;   TONSILLECTOMY     VASCULAR SURGERY Right    right leg   VEIN SURGERY Right    leg   Past Surgical History:  Procedure Laterality Date   ABDOMINAL HYSTERECTOMY     partial   ACHILLES TENDON SURGERY Left    APPENDECTOMY     BACK SURGERY     BLADDER SUSPENSION     BREAST SURGERY Right 2003   milk duct removed   CARDIAC CATHETERIZATION     CHOLECYSTECTOMY     CORONARY ARTERY BYPASS GRAFT N/A 11/14/2021   Procedure: CORONARY ARTERY BYPASS GRAFTING (CABG) TIMES THREE USING LIMA AND CRYO VEIN.;  Surgeon: Lovett Sox, MD;  Location: [facility] OR;  Service: Open Heart Surgery;  Laterality: N/A;   FRACTURE SURGERY     collar bone   LEFT HEART CATH AND CORONARY ANGIOGRAPHY N/A 03/08/2021   Procedure: LEFT HEART CATH AND CORONARY ANGIOGRAPHY;  Surgeon: Swaziland, Peter M, MD;  Location: [facility] INVASIVE CV LAB;  Service: Cardiovascular;  Laterality: N/A;   NOSE SURGERY     x 2    OTHER SURGICAL HISTORY     Breast Duct removed    PARTIAL HYSTERECTOMY     RECTOCELE REPAIR     TEE WITHOUT CARDIOVERSION N/A 11/14/2021   Procedure: TRANSESOPHAGEAL ECHOCARDIOGRAM (TEE);  Surgeon: Lovett Sox, MD;  Location: [facility] OR;  Service: Open Heart Surgery;  Laterality: N/A;   TONSILLECTOMY     VASCULAR SURGERY Right    right leg   VEIN SURGERY Right    leg   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HCC)    Anxiety    Arthritis    Diabetes mellitus without complication (HCC)    GERD (gastroesophageal reflux disease)  Heart murmur    Hyperlipidemia    Hypertension    Neuromuscular disorder (HCC)    Palpitations    Pneumonia    PONV (postoperative nausea and vomiting)    PTSD (post-traumatic stress disorder)    Ringing of ears, left    BP 123/73   Pulse 81   Ht 5\' 9"  (1.753 m)   Wt 177 lb (80.3 kg)   SpO2 97%   BMI 26.14 kg/m   Opioid Risk Score:   Fall Risk Score:  `1  Depression screen [facility] 2/9     12/01/2022    3:37 PM 08/28/2022    2:09 PM 05/13/2022    2:07 PM 05/06/2022    2:23 PM 01/18/2022   12:53 PM 01/07/2022    2:29 PM 10/31/2021    2:55 PM  Depression screen [facility] 2/9  Decreased Interest 0 0 0 0 0 0 0  Down, Depressed, Hopeless 2 0 0 0 0 0 0  PHQ - 2 Score 2 0 0 0 0 0 0  Altered sleeping 2  0  1    Tired, decreased energy 2  0  1    Change in appetite 2  0  0    Feeling bad or failure about yourself  0  0  1    Trouble concentrating 1  0  0    Moving slowly or fidgety/restless 0  0  0    Suicidal thoughts 0  0  0    PHQ-9 Score 9  0  3    Difficult doing work/chores Somewhat difficult  Not difficult at all  Somewhat difficult        Review of Systems  Musculoskeletal:  Positive for back pain and neck pain.       B/L knee foot LT shoulder       Objective:   Physical Exam Vitals and nursing note reviewed.  Constitutional:      Appearance: Normal appearance.  Cardiovascular:     Rate and Rhythm: Normal rate and regular rhythm.     Pulses: Normal pulses.      Heart sounds: Normal heart sounds.  Pulmonary:     Effort: Pulmonary effort is normal.     Breath sounds: Normal breath sounds.  Musculoskeletal:     Cervical back: Normal range of motion and neck supple.     Comments: Normal Muscle Bulk and Muscle Testing Reveals: Upper Extremities: Right: Full ROM and Muscle Strength  5/5 Left Upper Extremity: Decreased ROM 90 Degrees and Muscle Strength 5/5 Left AC Joint Tenderness  Lumbar Paraspinal Tenderness: L-4-L-5 Lower Extremities: Full ROM and Muscle Strength 5/5 Arises from Table with ease Narrow Based  Gait     Skin:    General: Skin is warm and dry.  Neurological:     Mental Status: She is alert and oriented to person, place, and time.  Psychiatric:        Mood and Affect: Mood normal.        Behavior: Behavior normal.         Assessment & Plan:  1.Chronic cervicalgia with documented spondylosis on MRI, per Dr. Riley Kill Note and Cervical Radiculitis: Continue Gabapentin,  Continue HEP as Tolerated. 12/25/2022 2. Left shoulder pain most consistent with left rotator cuff syndrome. Mild DJD.  Continue HEP as tolerated.Continue to monitor. 12/25/2022. Refilled:  Hydrocodone 10/325 mg one tablet every 6 hours as needed for pain. #120. Second script sent for the following month. We will continue the  opioid monitoring program, this consists of regular clinic visits, examinations, urine drug screen, pill counts as well as use of [place] Controlled Substance Reporting system. A 12 month History has been reviewed on the [place] Controlled Substance Reporting System on 12/25/2022. 3. Left wrist/finger pain most consistent with OA, ?post traumatic. No complaints today. 12/25/2022 4. Anxiety: Continue Klonopin, PCP prescribing. Continue to monitor. 12/25/2022 5. Restless Leg syndrome: Continue Gabapentin. 12/25/2022 7. Sacroiliac inflammation: S/P Right  SI injection on 09/20/2019, with No relief noted. Ms. Plotner seen Dr August Saucer in  regards to her pain. Dr Wynetta Emery. Following.  Continue to Monitor. 12/25/2022 8. Myofascial Pain: Continue Robaxin as needed. 12/25/2022.  9. Bilateral feet with neuropathic pain: Increase Gabapentin 600 mg 4 times a day. Continue to monitor. 12/25/2022. 10.  Herniated Nucleus Pulposus L2-3 with recurrent disc rupture: S/P  Posterior Lumbar Interbody Fusion  - Lumbar two-Lumbar three on 01/13/2020 by Dr Wynetta Emery.  Dr Wynetta Emery Following. 12/25/2022 11. Left Ankle Pain:No complaints today.  Ortho Following. . Continue to monitor. 12/25/2022   F/U in 2 months

## 2022-12-31 ENCOUNTER — Telehealth: Payer: Self-pay | Admitting: Pharmacy Technician

## 2022-12-31 ENCOUNTER — Other Ambulatory Visit (HOSPITAL_COMMUNITY): Payer: Self-pay

## 2022-12-31 NOTE — Telephone Encounter (Signed)
Pharmacy Patient Advocate Encounter   Received notification from CoverMyMeds that prior authorization for repatha is required/requested.   Insurance verification completed.   The patient is insured through [facility] .   Per test claim: PA required; PA submitted to [employer] via CoverMyMeds Key/confirmation #/EOC [id number] Status is pending

## 2022-12-31 NOTE — Telephone Encounter (Signed)
Pharmacy Patient Advocate Encounter   Received notification from CoverMyMeds that prior authorization for repatha is required/requested.   Insurance verification completed.   The patient is insured through [employer].   Per test claim: PA required; PA started via CoverMyMeds. KEY [id number] . Waiting for clinical questions to populate.

## 2023-01-01 ENCOUNTER — Other Ambulatory Visit (HOSPITAL_COMMUNITY): Payer: Self-pay

## 2023-01-01 NOTE — Telephone Encounter (Signed)
Pharmacy Patient Advocate Encounter  Received notification from [facility] that Prior Authorization for repatha has been APPROVED from 12/31/22 to 12/31/23. Ran test claim, Copay is $133.16 (GAP)-28 day supply. This test claim was processed through [facility]- copay amounts may vary at other pharmacies due to pharmacy/plan contracts, or as the patient moves through the different stages of their insurance plan.   PA #/Case ID/Reference #: [id number]

## 2023-01-08 DIAGNOSIS — M48061 Spinal stenosis, lumbar region without neurogenic claudication: Secondary | ICD-10-CM | POA: Diagnosis not present

## 2023-01-12 ENCOUNTER — Ambulatory Visit (INDEPENDENT_AMBULATORY_CARE_PROVIDER_SITE_OTHER): Payer: Medicare Other

## 2023-01-12 ENCOUNTER — Other Ambulatory Visit (HOSPITAL_COMMUNITY): Payer: Self-pay | Admitting: Neurosurgery

## 2023-01-12 VITALS — Ht 68.0 in | Wt 177.0 lb

## 2023-01-12 DIAGNOSIS — M48061 Spinal stenosis, lumbar region without neurogenic claudication: Secondary | ICD-10-CM

## 2023-01-12 DIAGNOSIS — Z0000 Encounter for general adult medical examination without abnormal findings: Secondary | ICD-10-CM | POA: Diagnosis not present

## 2023-01-12 NOTE — Patient Instructions (Addendum)
[name] , Thank you for taking time to come for your Medicare Wellness Visit. I appreciate your ongoing commitment to your health goals. Please review the following plan we discussed and let me know if I can assist you in the future.   Referrals/Orders/Follow-Ups/Clinician Recommendations:   This is a list of the screening recommended for you and due dates:  Health Maintenance  Topic Date Due   DTaP/Tdap/Td vaccine (1 - Tdap) Never done   COVID-19 Vaccine (2 - Moderna risk series) 08/10/2019   Mammogram  05/18/2020   Eye exam for diabetics  11/29/2020   Flu Shot  11/06/2022   Yearly kidney health urinalysis for diabetes  01/18/2023   Zoster (Shingles) Vaccine (2 of 2) 02/08/2023*   Colon Cancer Screening  11/08/2023*   Hemoglobin A1C  05/08/2023   Complete foot exam   09/03/2023   Yearly kidney function blood test for diabetes  10/02/2023   Medicare Annual Wellness Visit  01/12/2024   Pneumonia Vaccine  Completed   DEXA scan (bone density measurement)  Completed   Hepatitis C Screening  Completed   HPV Vaccine  Aged Out  *Topic was postponed. The date shown is not the original due date.   Opioid Pain Medicine Management Opioids are powerful medicines that are used to treat moderate to severe pain. When used for short periods of time, they can help you to: Sleep better. Do better in physical or occupational therapy. Feel better in the first few days after an injury. Recover from surgery. Opioids should be taken with the supervision of a trained health care provider. They should be taken for the shortest period of time possible. This is because opioids can be addictive, and the longer you take opioids, the greater your risk of addiction. This addiction can also be called opioid use disorder. What are the risks? Using opioid pain medicines for longer than 3 days increases your risk of side effects. Side effects include: Constipation. Nausea and vomiting. Breathing difficulties  (respiratory depression). Drowsiness. Confusion. Opioid use disorder. Itching. Taking opioid pain medicine for a long period of time can affect your ability to do daily tasks. It also puts you at risk for: Motor vehicle crashes. Depression. Suicide. Heart attack. Overdose, which can be life-threatening. What is a pain treatment plan? A pain treatment plan is an agreement between you and your health care provider. Pain is unique to each person, and treatments vary depending on your condition. To manage your pain, you and your health care provider need to work together. To help you do this: Discuss the goals of your treatment, including how much pain you might expect to have and how you will manage the pain. Review the risks and benefits of taking opioid medicines. Remember that a good treatment plan uses more than one approach and minimizes the chance of side effects. Be honest about the amount of medicines you take and about any drug or alcohol use. Get pain medicine prescriptions from only one health care provider. Pain can be managed with many types of alternative treatments. Ask your health care provider to refer you to one or more specialists who can help you manage pain through: Physical or occupational therapy. Counseling (cognitive behavioral therapy). Good nutrition. Biofeedback. Massage. Meditation. Non-opioid medicine. Following a gentle exercise program. How to use opioid pain medicine Taking medicine Take your pain medicine exactly as told by your health care provider. Take it only when you need it. If your pain gets less severe, you may take  less than your prescribed dose if your health care provider approves. If you are not having pain, do nottake pain medicine unless your health care provider tells you to take it. If your pain is severe, do nottry to treat it yourself by taking more pills than instructed on your prescription. Contact your health care provider for  help. Write down the times when you take your pain medicine. It is easy to become confused while on pain medicine. Writing the time can help you avoid overdose. Take other over-the-counter or prescription medicines only as told by your health care provider. Keeping yourself and others safe  While you are taking opioid pain medicine: Do not drive, use machinery, or power tools. Do not sign legal documents. Do not drink alcohol. Do not take sleeping pills. Do not supervise children by yourself. Do not do activities that require climbing or being in high places. Do not go to a lake, river, ocean, spa, or swimming pool. Do not share your pain medicine with anyone. Keep pain medicine in a locked cabinet or in a secure area where pets and children cannot reach it. Stopping your use of opioids If you have been taking opioid medicine for more than a few weeks, you may need to slowly decrease (taper) how much you take until you stop completely. Tapering your use of opioids can decrease your risk of symptoms of withdrawal, such as: Pain and cramping in the abdomen. Nausea. Sweating. Sleepiness. Restlessness. Uncontrollable shaking (tremors). Cravings for the medicine. Do not attempt to taper your use of opioids on your own. Talk with your health care provider about how to do this. Your health care provider may prescribe a step-down schedule based on how much medicine you are taking and how long you have been taking it. Getting rid of leftover pills Do not save any leftover pills. Get rid of leftover pills safely by: Taking the medicine to a prescription take-back program. This is usually offered by the county or law enforcement. Bringing them to a pharmacy that has a drug disposal container. Flushing them down the toilet. Check the label or package insert of your medicine to see whether this is safe to do. Throwing them out in the trash. Check the label or package insert of your medicine to see  whether this is safe to do. If it is safe to throw it out, remove the medicine from the original container, put it into a sealable bag or container, and mix it with used coffee grounds, food scraps, dirt, or cat litter before putting it in the trash. Follow these instructions at home: Activity Do exercises as told by your health care provider. Avoid activities that make your pain worse. Return to your normal activities as told by your health care provider. Ask your health care provider what activities are safe for you. General instructions You may need to take these actions to prevent or treat constipation: Drink enough fluid to keep your urine pale yellow. Take over-the-counter or prescription medicines. Eat foods that are high in fiber, such as beans, whole grains, and fresh fruits and vegetables. Limit foods that are high in fat and processed sugars, such as fried or sweet foods. Keep all follow-up visits. This is important. Where to find support If you have been taking opioids for a long time, you may benefit from receiving support for quitting from a local support group or counselor. Ask your health care provider for a referral to these resources in your area. Where to find  more information Centers for Disease Control and Prevention (CDC): FootballExhibition.com.br [place] Food and Drug Administration (FDA): PumpkinSearch.com.ee Get help right away if: You may have taken too much of an opioid (overdosed). Common symptoms of an overdose: Your breathing is slower or more shallow than normal. You have a very slow heartbeat (pulse). You have slurred speech. You have nausea and vomiting. Your pupils become very small. You have other potential symptoms: You are very confused. You faint or feel like you will faint. You have cold, clammy skin. You have blue lips or fingernails. You have thoughts of harming yourself or harming others. These symptoms may represent a serious problem that is an emergency. Do not wait  to see if the symptoms will go away. Get medical help right away. Call your local emergency services (911 in the [place] Do not drive yourself to the hospital.  If you ever feel like you may hurt yourself or others, or have thoughts about taking your own life, get help right away. Go to your nearest emergency department or: Call your local emergency services (911 in the [place] Call the [facility] (562 029 2085 in the [place] Call a suicide crisis helpline, such as the National Suicide Prevention Lifeline at 780-807-4889 or 988 in the [place] This is open 24 hours a day in the [place] Text the Crisis Text Line at (616)834-3819 (in the [place] Summary Opioid medicines can help you manage moderate to severe pain for a short period of time. A pain treatment plan is an agreement between you and your health care provider. Discuss the goals of your treatment, including how much pain you might expect to have and how you will manage the pain. If you think that you or someone else may have taken too much of an opioid, get medical help right away. This information is not intended to replace advice given to you by your health care provider. Make sure you discuss any questions you have with your health care provider. Document Revised: 10/17/2020 Document Reviewed: 07/04/2020 [employer] Patient Education  2024 [employer]  Advanced directives: (Declined) Advance directive discussed with you today. Even though you declined this today, please call our office should you change your mind, and we can give you the proper paperwork for you to fill out.  Next Medicare Annual Wellness Visit scheduled for next year: Yes

## 2023-01-12 NOTE — Progress Notes (Signed)
Subjective:   [name] is a 72 y.o. female who presents for Medicare Annual (Subsequent) preventive examination.  Visit Complete: Virtual  I connected with  [name] on 01/12/23 by a audio enabled telemedicine application and verified that I am speaking with the correct person using two identifiers.  Patient Location: Home  Provider Location: Home Office  I discussed the limitations of evaluation and management by telemedicine. The patient expressed understanding and agreed to proceed.  Patient Medicare AWV questionnaire was completed by the patient on 01/11/23; I have confirmed that all information answered by patient is correct and no changes since this date.  Cardiac Risk Factors include: advanced age (>49men, >59 women);diabetes mellitus;hypertensionBecause this visit was a virtual/telehealth visit, some criteria may be missing or patient reported. Any vitals not documented were not able to be obtained and vitals that have been documented are patient reported.       Objective:    Today's Vitals   01/12/23 1615  Weight: 177 lb (80.3 kg)  Height: 5\' 8"  (1.727 m)   Body mass index is 26.91 kg/m.     01/12/2023    4:29 PM 04/18/2022    2:44 PM 01/07/2022    2:34 PM 11/14/2021    3:00 PM 11/14/2021    6:24 AM 11/12/2021    3:54 PM 08/22/2021    7:35 AM  Advanced Directives  Does Patient Have a Medical Advance Directive? No Yes Yes Yes Yes Yes No  Type of Special educational needs teacher of [place];Living will Living will Living will  Living will   Does patient want to make changes to medical advance directive?  No - Patient declined  No - Patient declined No - Patient declined    Copy of Healthcare Power of Attorney in Chart?  No - copy requested No - copy requested      Would patient like information on creating a medical advance directive? No - Patient declined      No - Patient declined    Current Medications (verified) Outpatient Encounter  Medications as of 01/12/2023  Medication Sig   albuterol (VENTOLIN HFA) 108 (90 Base) MCG/ACT inhaler Inhale 2 puffs into the lungs every 6 (six) hours as needed for wheezing or shortness of breath.   Alcohol Swabs (B-D SINGLE USE SWABS REGULAR) PADS Use to test blood sugar 3 times daily   Blood Glucose Monitoring Suppl (ONETOUCH VERIO FLEX SYSTEM) w/Device KIT Use to test blood sugars 1-2 times daily.   clonazePAM (KLONOPIN) 1 MG tablet TAKE 1 TABLET(1 MG) BY MOUTH TWICE DAILY AS NEEDED FOR ANXIETY   clopidogrel (PLAVIX) 75 MG tablet Take 1 tablet (75 mg total) by mouth daily.   Dulaglutide (TRULICITY) 1.5 MG/0.5ML SOPN Inject 1.5 mg into the skin once a week.   Evolocumab (REPATHA SURECLICK) 140 MG/ML SOAJ Inject 140 mg into the skin every 14 (fourteen) days.   ezetimibe (ZETIA) 10 MG tablet Take 1 tablet (10 mg total) by mouth daily.   fluticasone (FLONASE) 50 MCG/ACT nasal spray Place 2 sprays into both nostrils daily.   furosemide (LASIX) 20 MG tablet Take 20 mg by mouth daily as needed.   gabapentin (NEURONTIN) 600 MG tablet TAKE 1 TABLET BY MOUTH EVERY MORNING AND AT NOON AND EVERY EVENING AND AT BEDTIME   guaiFENesin (MUCINEX) 600 MG 12 hr tablet Take 600 mg by mouth daily.   HYDROcodone-acetaminophen (NORCO) 10-325 MG tablet Take 1 tablet by mouth every 6 (six) hours as needed.  JARDIANCE 10 MG TABS tablet Take 1 tablet (10 mg total) by mouth daily.   Lancets (ONETOUCH ULTRASOFT) lancets Use to test blood sugar 1-2 times daily.   melatonin 5 MG TABS Take 1 tablet (5 mg total) by mouth at bedtime as needed.   metoprolol succinate (TOPROL XL) 25 MG 24 hr tablet Take 3 tablets (75 mg total) by mouth 2 (two) times daily.   ondansetron (ZOFRAN-ODT) 4 MG disintegrating tablet Take 1 tablet (4 mg total) by mouth daily as needed for nausea or vomiting.   pantoprazole (PROTONIX) 40 MG tablet Take 1 tablet (40 mg total) by mouth daily.   Polyethyl Glycol-Propyl Glycol (SYSTANE) 0.4-0.3 % SOLN  Place 1 drop into both eyes daily as needed (Dry eye).   potassium chloride SA (KLOR-CON M20) 20 MEQ tablet Take 1 tablet (20 mEq total) by mouth daily.   QUEtiapine (SEROQUEL) 100 MG tablet Take 0.5 tablets (50 mg total) by mouth at bedtime.   rOPINIRole (REQUIP) 0.5 MG tablet Take 1 tablet (0.5 mg total) by mouth at bedtime.   triamcinolone (KENALOG) 0.1 % paste Use as directed 1 Application in the mouth or throat daily as needed (mouth pain).   [DISCONTINUED] sacubitril-valsartan (ENTRESTO) 24-26 MG Take 1 tablet by mouth 2 (two) times daily.   No facility-administered encounter medications on file as of 01/12/2023.    Allergies (verified) Hydromorphone, Morphine and codeine, Statins, and Penicillins   History: 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HCC)    Anxiety    Arthritis    Diabetes mellitus without complication (HCC)    GERD (gastroesophageal reflux disease)    Heart murmur    Hyperlipidemia    Hypertension    Neuromuscular disorder (HCC)    Palpitations    Pneumonia    PONV (postoperative nausea and vomiting)    PTSD (post-traumatic stress disorder)    Ringing of ears, left    Past Surgical History:  Procedure Laterality Date   ABDOMINAL HYSTERECTOMY     partial   ACHILLES TENDON SURGERY Left    APPENDECTOMY     BACK SURGERY     BLADDER SUSPENSION     BREAST SURGERY Right 2003   milk duct removed   CARDIAC CATHETERIZATION     CHOLECYSTECTOMY     CORONARY ARTERY BYPASS GRAFT N/A 11/14/2021   Procedure: CORONARY ARTERY BYPASS GRAFTING (CABG) TIMES THREE USING LIMA AND CRYO VEIN.;  Surgeon: Lovett Sox, MD;  Location: [facility] OR;  Service: Open Heart Surgery;  Laterality: N/A;   FRACTURE SURGERY     collar bone   LEFT HEART CATH AND CORONARY ANGIOGRAPHY N/A 03/08/2021   Procedure: LEFT HEART CATH AND CORONARY ANGIOGRAPHY;  Surgeon: [place], Peter M, MD;  Location: [facility] INVASIVE CV LAB;  Service: Cardiovascular;  Laterality: N/A;   NOSE SURGERY      x 2    OTHER SURGICAL HISTORY     Breast Duct removed   PARTIAL HYSTERECTOMY     RECTOCELE REPAIR     TEE WITHOUT CARDIOVERSION N/A 11/14/2021   Procedure: TRANSESOPHAGEAL ECHOCARDIOGRAM (TEE);  Surgeon: Lovett Sox, MD;  Location: [facility] OR;  Service: Open Heart Surgery;  Laterality: N/A;   TONSILLECTOMY     VASCULAR SURGERY Right    right leg   VEIN SURGERY Right    leg   Family History  Problem Relation Age of Onset   Diabetes Mother    Alzheimer's disease Mother    Lung cancer Father  Esophageal cancer Brother    Colon cancer Neg Hx    Social History   Socioeconomic History   Marital status: Widowed    Spouse name: Not on file   Number of children: 1   Years of education: Not on file   Highest education level: 12th grade  Occupational History   Occupation: retired    Comment: work part time  Tobacco Use   Smoking status: Never   Smokeless tobacco: Never   Tobacco comments:    Never smoke 11/01/21  Vaping Use   Vaping status: Never Used  Substance and Sexual Activity   Alcohol use: Never   Drug use: No   Sexual activity: Not on file  Other Topics Concern   Not on file  Social History Narrative   Not on file   Social Determinants of Health   Financial Resource Strain: Low Risk  (01/11/2023)   Overall Financial Resource Strain (CARDIA)    Difficulty of Paying Living Expenses: Not hard at all  Food Insecurity: No Food Insecurity (01/11/2023)   Hunger Vital Sign    Worried About Running Out of Food in the Last Year: Never true    Ran Out of Food in the Last Year: Never true  Transportation Needs: No Transportation Needs (01/11/2023)   PRAPARE - Administrator, Civil Service (Medical): No    Lack of Transportation (Non-Medical): No  Physical Activity: Unknown (01/11/2023)   Exercise Vital Sign    Days of Exercise per Week: 2 days    Minutes of Exercise per Session: Patient declined  Stress: Stress Concern Present (01/11/2023)   [employer] of Occupational Health - Occupational Stress Questionnaire    Feeling of Stress : To some extent  Social Connections: Moderately Integrated (01/11/2023)   Social Connection and Isolation Panel [NHANES]    Frequency of Communication with Friends and Family: Once a week    Frequency of Social Gatherings with Friends and Family: Twice a week    Attends Religious Services: More than 4 times per year    Active Member of [employer] or Organizations: No    Attends Engineer, structural: More than 4 times per year    Marital Status: Widowed    Tobacco Counseling Counseling given: Not Answered Tobacco comments: Never smoke 11/01/21   Clinical Intake:  Pre-visit preparation completed: Yes  Pain : No/denies pain     BMI - recorded: 26.91 Nutritional Status: BMI 25 -29 Overweight Nutritional Risks: None Diabetes: Yes CBG done?: No Did pt. bring in CBG monitor from home?: No  How often do you need to have someone help you when you read instructions, pamphlets, or other written materials from your doctor or pharmacy?: 1 - Never  Interpreter Needed?: No  Information entered by :: Theresa Mulligan LPN   Activities of Daily Living    01/11/2023    6:04 PM  In your present state of health, do you have any difficulty performing the following activities:  Hearing? 0  Vision? 0  Difficulty concentrating or making decisions? 0  Walking or climbing stairs? 0  Dressing or bathing? 0  Doing errands, shopping? 0  Preparing Food and eating ? N  Using the Toilet? N  In the past six months, have you accidently leaked urine? N  Do you have problems with loss of bowel control? N  Managing your Medications? N  Managing your Finances? N  Housekeeping or managing your Housekeeping? N    Patient Care Team: [place],  Timoteo Expose, MD as PCP - General (Family Medicine) Pricilla Riffle, MD as PCP - Cardiology (Cardiology) Laurey Morale, MD as PCP - Advanced Heart Failure  (Cardiology) Lutricia Feil, [facility] as Pharmacist (Pharmacist)  Indicate any recent Medical Services you may have received from other than [facility] providers in the past year (date may be approximate).     Assessment:   This is a routine wellness examination for [name].  Hearing/Vision screen Hearing Screening - Comments:: Denies hearing difficulties   Vision Screening - Comments:: Wears rx glasses - up to date with routine eye exams with  Dr Dione Booze   Goals Addressed               This Visit's Progress     Stay Active (pt-stated)        Stay happy, and healthy.          Depression Screen    01/12/2023    4:26 PM 12/25/2022    3:32 PM 12/01/2022    3:37 PM 08/28/2022    2:09 PM 05/13/2022    2:07 PM 05/06/2022    2:23 PM 01/18/2022   12:53 PM  PHQ 2/9 Scores  PHQ - 2 Score 0 0 2 0 0 0 0  PHQ- 9 Score 0  9  0  3    Fall Risk    01/12/2023    4:29 PM 01/11/2023    6:04 PM 12/25/2022    3:32 PM 12/01/2022    3:37 PM 08/28/2022    2:09 PM  Fall Risk   Falls in the past year? 0 0 0 0 0  Number falls in past yr: 0  0 0 0  Injury with Fall? 0  0 0   Risk for fall due to : No Fall Risks   No Fall Risks Impaired balance/gait  Follow up Falls prevention discussed        MEDICARE RISK AT HOME: Medicare Risk at Home Any stairs in or around the home?: No If so, are there any without handrails?: No Home free of loose throw rugs in walkways, pet beds, electrical cords, etc?: Yes Adequate lighting in your home to reduce risk of falls?: Yes Life alert?: No Use of a cane, walker or w/c?: No Grab bars in the bathroom?: Yes Shower chair or bench in shower?: No Elevated toilet seat or a handicapped toilet?: Yes  TIMED UP AND GO:  Was the test performed?  No    Cognitive Function:        01/12/2023    4:29 PM 01/07/2022    2:34 PM  6CIT Screen  What Year? 0 points 0 points  What month? 0 points 0 points  What time? 0 points 0 points  Count back from 20 0 points 0 points   Months in reverse 0 points 0 points  Repeat phrase 0 points 0 points  Total Score 0 points 0 points    Immunizations Immunization History  Administered Date(s) Administered   Influenza Split 01/28/2012, 04/07/2012   Influenza, Quadrivalent, Recombinant, Inj, Pf 12/18/2018   Influenza,inj,Quad PF,6+ Mos 12/29/2016   Influenza-Unspecified 02/03/2013, 03/06/2020, 12/19/2020   MODERNA COVID-19 SARS-COV-2 PEDS BIVALENT BOOSTER 18yr-63yr 07/13/2019   Pneumococcal Conjugate-13 01/13/2017   Pneumococcal Polysaccharide-23 11/17/2018   Zoster Recombinant(Shingrix) 12/10/2018   Zoster, Live 03/15/2015, 03/24/2015    TDAP status: Due, Education has been provided regarding the importance of this vaccine. Advised may receive this vaccine at local pharmacy or Health Dept. Aware to provide a  copy of the vaccination record if obtained from local pharmacy or Health Dept. Verbalized acceptance and understanding.  Flu Vaccine status: Due, Education has been provided regarding the importance of this vaccine. Advised may receive this vaccine at local pharmacy or Health Dept. Aware to provide a copy of the vaccination record if obtained from local pharmacy or Health Dept. Verbalized acceptance and understanding.  Pneumococcal vaccine status: Up to date  Covid-19 vaccine status: Declined, Education has been provided regarding the importance of this vaccine but patient still declined. Advised may receive this vaccine at local pharmacy or Health Dept.or vaccine clinic. Aware to provide a copy of the vaccination record if obtained from local pharmacy or Health Dept. Verbalized acceptance and understanding.  Qualifies for Shingles Vaccine? Yes   Zostavax completed No   Shingrix Completed?: No.    Education has been provided regarding the importance of this vaccine. Patient has been advised to call insurance company to determine out of pocket expense if they have not yet received this vaccine. Advised may also  receive vaccine at local pharmacy or Health Dept. Verbalized acceptance and understanding.  Screening Tests Health Maintenance  Topic Date Due   DTaP/Tdap/Td (1 - Tdap) Never done   COVID-19 Vaccine (2 - Moderna risk series) 08/10/2019   MAMMOGRAM  05/18/2020   OPHTHALMOLOGY EXAM  11/29/2020   INFLUENZA VACCINE  11/06/2022   Diabetic kidney evaluation - Urine ACR  01/18/2023   Zoster Vaccines- Shingrix (2 of 2) 02/08/2023 (Originally 02/04/2019)   Colonoscopy  11/08/2023 (Originally 02/18/2022)   HEMOGLOBIN A1C  05/08/2023   FOOT EXAM  09/03/2023   Diabetic kidney evaluation - eGFR measurement  10/02/2023   Medicare Annual Wellness (AWV)  01/12/2024   Pneumonia Vaccine 66+ Years old  Completed   DEXA SCAN  Completed   Hepatitis C Screening  Completed   HPV VACCINES  Aged Out    Health Maintenance  Health Maintenance Due  Topic Date Due   DTaP/Tdap/Td (1 - Tdap) Never done   COVID-19 Vaccine (2 - Moderna risk series) 08/10/2019   MAMMOGRAM  05/18/2020   OPHTHALMOLOGY EXAM  11/29/2020   INFLUENZA VACCINE  11/06/2022   Diabetic kidney evaluation - Urine ACR  01/18/2023    Colorectal cancer screening: Referral to GI placed Patient deferred. Pt aware the office will call re: appt.  Mammogram status: Ordered Patient declined. Pt provided with contact info and advised to call to schedule appt.   Bone Density status: Completed 05/13/19. Results reflect: Bone density results: OSTEOPOROSIS. Repeat every   years.    Additional Screening:  Hepatitis C Screening: does qualify; Completed 10/16/17  Vision Screening: Recommended annual ophthalmology exams for early detection of glaucoma and other disorders of the eye. Is the patient up to date with their annual eye exam?  Yes  Who is the provider or what is the name of the office in which the patient attends annual eye exams? Dr Dione Booze If pt is not established with a provider, would they like to be referred to a provider to establish  care? No .   Dental Screening: Recommended annual dental exams for proper oral hygiene  Diabetic Foot Exam: Diabetic Foot Exam: Completed 09/03/22  Community Resource Referral / Chronic Care Management:  CRR required this visit?  No   CCM required this visit?  No     Plan:     I have personally reviewed and noted the following in the patient's chart:   Medical and social history Use of alcohol,  tobacco or illicit drugs  Current medications and supplements including opioid prescriptions. Patient is currently taking opioid prescriptions. Information provided to patient regarding non-opioid alternatives. Patient advised to discuss non-opioid treatment plan with their provider. Functional ability and status Nutritional status Physical activity Advanced directives List of other physicians Hospitalizations, surgeries, and ER visits in previous 12 months Vitals Screenings to include cognitive, depression, and falls Referrals and appointments  In addition, I have reviewed and discussed with patient certain preventive protocols, quality metrics, and best practice recommendations. A written personalized care plan for preventive services as well as general preventive health recommendations were provided to patient.     Tillie Rung, LPN   16/04/958   After Visit Summary: (MyChart) Due to this being a telephonic visit, the after visit summary with patients personalized plan was offered to patient via MyChart   Nurse Notes: None

## 2023-01-15 ENCOUNTER — Encounter: Payer: Self-pay | Admitting: Internal Medicine

## 2023-01-15 ENCOUNTER — Ambulatory Visit: Payer: Medicare Other | Attending: Internal Medicine | Admitting: Internal Medicine

## 2023-01-15 VITALS — BP 136/76 | HR 76 | Ht 68.0 in | Wt 179.0 lb

## 2023-01-15 DIAGNOSIS — I493 Ventricular premature depolarization: Secondary | ICD-10-CM | POA: Diagnosis not present

## 2023-01-15 NOTE — Patient Instructions (Signed)
Medication Instructions:  Your physician recommends that you continue on your current medications as directed. Please refer to the Current Medication list given to you today.  *If you need a refill on your cardiac medications before your next appointment, please call your pharmacy*   Lab Work: None If you have labs (blood work) drawn today and your tests are completely normal, you will receive your results only by: MyChart Message (if you have MyChart) OR A paper copy in the mail If you have any lab test that is abnormal or we need to change your treatment, we will call you to review the results.   Testing/Procedures: None   Follow-Up: At [facility], you and your health needs are our priority.  As part of our continuing mission to provide you with exceptional heart care, we have created designated Provider Care Teams.  These Care Teams include your primary Cardiologist (physician) and Advanced Practice Providers (APPs -  Physician Assistants and Nurse Practitioners) who all work together to provide you with the care you need, when you need it.  We recommend signing up for the patient portal called "MyChart".  Sign up information is provided on this After Visit Summary.  MyChart is used to connect with patients for Virtual Visits (Telemedicine).  Patients are able to view lab/test results, encounter notes, upcoming appointments, etc.  Non-urgent messages can be sent to your provider as well.   To learn more about what you can do with MyChart, go to [email or web address].    Your next appointment:    Follow up as needed.  Provider:   Lewayne Bunting, MD    Other Instructions

## 2023-01-15 NOTE — Progress Notes (Signed)
HPI [name] is referred by Dr. Shirlee Latch for evaluation of PVC's. She is a pleasant 72 yo woman with an ICM, s/p CABG. She was noted to have PVC's on her ECG despite a lack of symptoms. Repeat monitors have shown a PVC density of 24% (pre-CABG), 13%, and most recently 18%. She remains active and denies chest pain or sob. No syncope. She has mild LV dysfunction.  Allergies  Allergen Reactions   Hydromorphone Nausea And Vomiting and Other (See Comments)    Made the patient feel "spaced out,"  Other Reaction(s): GI Intolerance, Other (See Comments)  Made the patient feel "spaced out," also   Morphine And Codeine Itching    With high doses   Statins     Hx intolerance to multiple statins   Penicillins Rash    Has patient had a PCN reaction causing immediate rash, facial/tongue/throat swelling, SOB or lightheadedness with hypotension: Yes  Has patient had a PCN reaction causing severe rash involving mucus membranes or skin necrosis: No  Has patient had a PCN reaction that required hospitalization: No  Has patient had a PCN reaction occurring within the last 10 years: No  If all of the above answers are "NO", then may proceed with Cephalosporin use.  Has patient had a PCN reaction causing immediate rash, facial/tongue/throat swelling, SOB or lightheadedness with hypotension: Yes, Has patient had a PCN reaction causing severe rash involving mucus membranes or skin necrosis: No, Has patient had a PCN reaction that required hospitalization: No, Has patient had a PCN reaction occurring within the last 10 years: No, If all of the above answers are "NO", then may proceed with Cephalosporin use.     Current Outpatient Medications  Medication Sig Dispense Refill   albuterol (VENTOLIN HFA) 108 (90 Base) MCG/ACT inhaler Inhale 2 puffs into the lungs every 6 (six) hours as needed for wheezing or shortness of breath. 18 g 2   Alcohol Swabs (B-D SINGLE USE SWABS REGULAR) PADS Use to test  blood sugar 3 times daily 100 each 3   Blood Glucose Monitoring Suppl (ONETOUCH VERIO FLEX SYSTEM) w/Device KIT Use to test blood sugars 1-2 times daily. 1 kit 0   clonazePAM (KLONOPIN) 1 MG tablet TAKE 1 TABLET(1 MG) BY MOUTH TWICE DAILY AS NEEDED FOR ANXIETY 60 tablet 3   clopidogrel (PLAVIX) 75 MG tablet Take 1 tablet (75 mg total) by mouth daily. 90 tablet 3   Dulaglutide (TRULICITY) 1.5 MG/0.5ML SOPN Inject 1.5 mg into the skin once a week. 13 mL 3   fluticasone (FLONASE) 50 MCG/ACT nasal spray Place 2 sprays into both nostrils daily.     furosemide (LASIX) 20 MG tablet Take 20 mg by mouth daily as needed.     gabapentin (NEURONTIN) 600 MG tablet TAKE 1 TABLET BY MOUTH EVERY MORNING AND AT NOON AND EVERY EVENING AND AT BEDTIME 120 tablet 3   guaiFENesin (MUCINEX) 600 MG 12 hr tablet Take 600 mg by mouth daily.     HYDROcodone-acetaminophen (NORCO) 10-325 MG tablet Take 1 tablet by mouth every 6 (six) hours as needed. 120 tablet 0   JARDIANCE 10 MG TABS tablet Take 1 tablet (10 mg total) by mouth daily. 90 tablet 3   Lancets (ONETOUCH ULTRASOFT) lancets Use to test blood sugar 1-2 times daily. 100 each 12   melatonin 5 MG TABS Take 1 tablet (5 mg total) by mouth at bedtime as needed.  0   metoprolol succinate (TOPROL-XL) 25 MG 24 hr  tablet Take 50 mg by mouth 2 (two) times daily.     Evolocumab (REPATHA SURECLICK) 140 MG/ML SOAJ Inject 140 mg into the skin every 14 (fourteen) days. (Patient not taking: Reported on 01/15/2023) 6 mL 3   ezetimibe (ZETIA) 10 MG tablet Take 1 tablet (10 mg total) by mouth daily. (Patient not taking: Reported on 01/15/2023) 90 tablet 3   ondansetron (ZOFRAN-ODT) 4 MG disintegrating tablet Take 1 tablet (4 mg total) by mouth daily as needed for nausea or vomiting. 20 tablet 2   pantoprazole (PROTONIX) 40 MG tablet Take 1 tablet (40 mg total) by mouth daily. 90 tablet 3   Polyethyl Glycol-Propyl Glycol (SYSTANE) 0.4-0.3 % SOLN Place 1 drop into both eyes daily as  needed (Dry eye).     potassium chloride SA (KLOR-CON M20) 20 MEQ tablet Take 1 tablet (20 mEq total) by mouth daily. 30 tablet 5   QUEtiapine (SEROQUEL) 100 MG tablet Take 0.5 tablets (50 mg total) by mouth at bedtime. 45 tablet 2   rOPINIRole (REQUIP) 0.5 MG tablet Take 1 tablet (0.5 mg total) by mouth at bedtime. 30 tablet 5   triamcinolone (KENALOG) 0.1 % paste Use as directed 1 Application in the mouth or throat daily as needed (mouth pain).     No current facility-administered medications for this visit.     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HCC)    Anxiety    Arthritis    Diabetes mellitus without complication (HCC)    GERD (gastroesophageal reflux disease)    Heart murmur    Hyperlipidemia    Hypertension    Neuromuscular disorder (HCC)    Palpitations    Pneumonia    PONV (postoperative nausea and vomiting)    PTSD (post-traumatic stress disorder)    Ringing of ears, left     ROS:   All systems reviewed and negative except as noted in the HPI.   Past Surgical History:  Procedure Laterality Date   ABDOMINAL HYSTERECTOMY     partial   ACHILLES TENDON SURGERY Left    APPENDECTOMY     BACK SURGERY     BLADDER SUSPENSION     BREAST SURGERY Right 2003   milk duct removed   CARDIAC CATHETERIZATION     CHOLECYSTECTOMY     CORONARY ARTERY BYPASS GRAFT N/A 11/14/2021   Procedure: CORONARY ARTERY BYPASS GRAFTING (CABG) TIMES THREE USING LIMA AND CRYO VEIN.;  Surgeon: Lovett Sox, MD;  Location: [facility] OR;  Service: Open Heart Surgery;  Laterality: N/A;   FRACTURE SURGERY     collar bone   LEFT HEART CATH AND CORONARY ANGIOGRAPHY N/A 03/08/2021   Procedure: LEFT HEART CATH AND CORONARY ANGIOGRAPHY;  Surgeon: Swaziland, Peter M, MD;  Location: [facility] INVASIVE CV LAB;  Service: Cardiovascular;  Laterality: N/A;   NOSE SURGERY     x 2    OTHER SURGICAL HISTORY     Breast Duct removed   PARTIAL HYSTERECTOMY     RECTOCELE REPAIR     TEE WITHOUT  CARDIOVERSION N/A 11/14/2021   Procedure: TRANSESOPHAGEAL ECHOCARDIOGRAM (TEE);  Surgeon: Lovett Sox, MD;  Location[facility] OR;  Service: Open Heart Surgery;  Laterality: N/A;   TONSILLECTOMY     VASCULAR SURGERY Right    right leg   VEIN SURGERY Right    leg     Family History  Problem Relation Age of Onset   Diabetes Mother    Alzheimer's disease Mother    Lung  cancer Father    Esophageal cancer Brother    Colon cancer Neg Hx      Social History   Socioeconomic History   Marital status: Widowed    Spouse name: Not on file   Number of children: 1   Years of education: Not on file   Highest education level: 12th grade  Occupational History   Occupation: retired    Comment: work part time  Tobacco Use   Smoking status: Never   Smokeless tobacco: Never   Tobacco comments:    Never smoke 11/01/21  Vaping Use   Vaping status: Never Used  Substance and Sexual Activity   Alcohol use: Never   Drug use: No   Sexual activity: Not on file  Other Topics Concern   Not on file  Social History Narrative   Not on file   Social Determinants of Health   Financial Resource Strain: Low Risk  (01/11/2023)   Overall Financial Resource Strain (CARDIA)    Difficulty of Paying Living Expenses: Not hard at all  Food Insecurity: No Food Insecurity (01/11/2023)   Hunger Vital Sign    Worried About Running Out of Food in the Last Year: Never true    Ran Out of Food in the Last Year: Never true  Transportation Needs: No Transportation Needs (01/11/2023)   PRAPARE - Administrator, Civil Service (Medical): No    Lack of Transportation (Non-Medical): No  Physical Activity: Unknown (01/11/2023)   Exercise Vital Sign    Days of Exercise per Week: 2 days    Minutes of Exercise per Session: Patient declined  Stress: Stress Concern Present (01/11/2023)   [employer] of Occupational Health - Occupational Stress Questionnaire    Feeling of Stress : To some extent  Social  Connections: Moderately Integrated (01/11/2023)   Social Connection and Isolation Panel [NHANES]    Frequency of Communication with Friends and Family: Once a week    Frequency of Social Gatherings with Friends and Family: Twice a week    Attends Religious Services: More than 4 times per year    Active Member of [employer] or Organizations: No    Attends Engineer, structural: More than 4 times per year    Marital Status: Widowed  Intimate Partner Violence: Not At Risk (01/12/2023)   Humiliation, Afraid, Rape, and Kick questionnaire    Fear of Current or Ex-Partner: No    Emotionally Abused: No    Physically Abused: No    Sexually Abused: No     BP 136/76   Pulse 76   Ht 5\' 8"  (1.727 m)   Wt 179 lb (81.2 kg)   BMI 27.22 kg/m   Physical Exam:  Well appearing NAD HEENT: Unremarkable Neck:  No JVD, no thyromegally Lymphatics:  No adenopathy Back:  No CVA tenderness Lungs:  Clear with no wheezes HEART:  Regular rate rhythm, no murmurs, no rubs, no clicks Abd:  soft, positive bowel sounds, no organomegally, no rebound, no guarding Ext:  2 plus pulses, no edema, no cyanosis, no clubbing Skin:  No rashes no nodules Neuro:  CN II through XII intact, motor grossly intact  EKG - none    Assess/Plan: PVC's - we discussed the treatment options. I have recommended she continue her beta blocker. She decided not to try mexitil. As she is asymptomatic, and her PVC's are less than 20%, I think it is reasonable to undergo watchful waiting. She is not inclined to take any med that  is not absolutely necessary. She is taking 50 bid of toprol. She decided not to take 150 bid.   Jeanette Gowda Auda Finfrock,MD

## 2023-01-22 ENCOUNTER — Ambulatory Visit (HOSPITAL_COMMUNITY)
Admission: RE | Admit: 2023-01-22 | Discharge: 2023-01-22 | Disposition: A | Discharge status: Home / Self Care | Payer: Medicare Other | Source: Ambulatory Visit | Attending: Neurosurgery | Admitting: Neurosurgery

## 2023-01-22 DIAGNOSIS — M549 Dorsalgia, unspecified: Secondary | ICD-10-CM | POA: Diagnosis not present

## 2023-01-22 DIAGNOSIS — M48061 Spinal stenosis, lumbar region without neurogenic claudication: Secondary | ICD-10-CM | POA: Insufficient documentation

## 2023-01-22 DIAGNOSIS — M4319 Spondylolisthesis, multiple sites in spine: Secondary | ICD-10-CM | POA: Diagnosis not present

## 2023-01-22 DIAGNOSIS — M4808 Spinal stenosis, sacral and sacrococcygeal region: Secondary | ICD-10-CM | POA: Diagnosis not present

## 2023-01-26 DIAGNOSIS — E119 Type 2 diabetes mellitus without complications: Secondary | ICD-10-CM | POA: Diagnosis not present

## 2023-01-26 DIAGNOSIS — H0011 Chalazion right upper eyelid: Secondary | ICD-10-CM | POA: Diagnosis not present

## 2023-01-26 DIAGNOSIS — H2513 Age-related nuclear cataract, bilateral: Secondary | ICD-10-CM | POA: Diagnosis not present

## 2023-02-19 DIAGNOSIS — M48061 Spinal stenosis, lumbar region without neurogenic claudication: Secondary | ICD-10-CM | POA: Diagnosis not present

## 2023-02-19 DIAGNOSIS — Z6826 Body mass index (BMI) 26.0-26.9, adult: Secondary | ICD-10-CM | POA: Diagnosis not present

## 2023-02-23 ENCOUNTER — Encounter: Payer: Medicare Other | Attending: Physical Medicine & Rehabilitation | Admitting: Registered Nurse

## 2023-02-23 ENCOUNTER — Other Ambulatory Visit (HOSPITAL_COMMUNITY): Payer: Medicare Other

## 2023-02-23 ENCOUNTER — Encounter (HOSPITAL_COMMUNITY): Payer: Medicare Other | Admitting: Cardiology

## 2023-02-23 VITALS — BP 141/85 | HR 65 | Ht 68.0 in | Wt 174.0 lb

## 2023-02-23 DIAGNOSIS — M5412 Radiculopathy, cervical region: Secondary | ICD-10-CM

## 2023-02-23 DIAGNOSIS — M2550 Pain in unspecified joint: Secondary | ICD-10-CM

## 2023-02-23 DIAGNOSIS — G894 Chronic pain syndrome: Secondary | ICD-10-CM | POA: Diagnosis not present

## 2023-02-23 DIAGNOSIS — G8929 Other chronic pain: Secondary | ICD-10-CM

## 2023-02-23 DIAGNOSIS — M47812 Spondylosis without myelopathy or radiculopathy, cervical region: Secondary | ICD-10-CM | POA: Diagnosis not present

## 2023-02-23 DIAGNOSIS — M542 Cervicalgia: Secondary | ICD-10-CM

## 2023-02-23 DIAGNOSIS — R202 Paresthesia of skin: Secondary | ICD-10-CM | POA: Diagnosis not present

## 2023-02-23 DIAGNOSIS — Z79891 Long term (current) use of opiate analgesic: Secondary | ICD-10-CM | POA: Diagnosis not present

## 2023-02-23 DIAGNOSIS — Z5181 Encounter for therapeutic drug level monitoring: Secondary | ICD-10-CM | POA: Diagnosis not present

## 2023-02-23 DIAGNOSIS — M545 Low back pain, unspecified: Secondary | ICD-10-CM | POA: Diagnosis not present

## 2023-02-23 MED ORDER — HYDROCODONE-ACETAMINOPHEN 10-325 MG PO TABS: 1.0000 | ORAL_TABLET | Freq: Four times a day (QID) | ORAL | 0 refills | Status: DC | PRN

## 2023-02-23 MED ORDER — GABAPENTIN 600 MG PO TABS: ORAL_TABLET | ORAL | 3 refills | Status: DC

## 2023-02-23 NOTE — Progress Notes (Unsigned)
Subjective:    Patient ID: [name], female    DOB: 1951-02-21, 72 y.o.   MRN: [id number]  HPI: [name] is a 72 y.o. female who returns for follow up appointment for chronic pain and medication refill. states *** pain is located in  ***. rates pain ***. current exercise regime is walking and performing stretching exercises.  [name] Morphine equivalent is *** MME.      Pain Inventory Average Pain 8 Pain Right Now 8 My pain is burning, dull, tingling, and aching  In the last 24 hours, has pain interfered with the following? General activity 8 Relation with others 8 Enjoyment of life 8 What TIME of day is your pain at its worst? morning  and daytime Sleep (in general) Fair  Pain is worse with: walking, bending, sitting, inactivity, standing, and some activites Pain improves with: rest, therapy/exercise, medication, TENS, and injections Relief from Meds: 10  Family History  Problem Relation Age of Onset   Diabetes Mother    Alzheimer's disease Mother    Lung cancer Father    Esophageal cancer Brother    Colon cancer Neg Hx    Social History   Socioeconomic History   Marital status: Widowed    Spouse name: Not on file   Number of children: 1   Years of education: Not on file   Highest education level: 12th grade  Occupational History   Occupation: retired    Comment: work part time  Tobacco Use   Smoking status: Never   Smokeless tobacco: Never   Tobacco comments:    Never smoke 11/01/21  Vaping Use   Vaping status: Never Used  Substance and Sexual Activity   Alcohol use: Never   Drug use: No   Sexual activity: Not on file  Other Topics Concern   Not on file  Social History Narrative   Not on file   Social Determinants of Health   Financial Resource Strain: Low Risk  (01/11/2023)   Overall Financial Resource Strain (CARDIA)    Difficulty of Paying Living Expenses: Not hard at all  Food Insecurity: No Food Insecurity (01/11/2023)    Hunger Vital Sign    Worried About Running Out of Food in the Last Year: Never true    Ran Out of Food in the Last Year: Never true  Transportation Needs: No Transportation Needs (01/11/2023)   PRAPARE - Administrator, Civil Service (Medical): No    Lack of Transportation (Non-Medical): No  Physical Activity: Unknown (01/11/2023)   Exercise Vital Sign    Days of Exercise per Week: 2 days    Minutes of Exercise per Session: Patient declined  Stress: Stress Concern Present (01/11/2023)   [employer] of Occupational Health - Occupational Stress Questionnaire    Feeling of Stress : To some extent  Social Connections: Unknown (01/11/2023)   Social Connection and Isolation Panel [NHANES]    Frequency of Communication with Friends and Family: Once a week    Frequency of Social Gatherings with Friends and Family: Twice a week    Attends Religious Services: Not on Marketing executive or Organizations: No    Attends Banker Meetings: Not on file    Marital Status: Widowed   Past Surgical History:  Procedure Laterality Date   ABDOMINAL HYSTERECTOMY     partial   ACHILLES TENDON SURGERY Left    APPENDECTOMY     BACK SURGERY  BLADDER SUSPENSION     BREAST SURGERY Right 2003   milk duct removed   CARDIAC CATHETERIZATION     CHOLECYSTECTOMY     CORONARY ARTERY BYPASS GRAFT N/A 11/14/2021   Procedure: CORONARY ARTERY BYPASS GRAFTING (CABG) TIMES THREE USING LIMA AND CRYO VEIN.;  Surgeon: Lovett Sox, MD;  Location: [facility] OR;  Service: Open Heart Surgery;  Laterality: N/A;   FRACTURE SURGERY     collar bone   LEFT HEART CATH AND CORONARY ANGIOGRAPHY N/A 03/08/2021   Procedure: LEFT HEART CATH AND CORONARY ANGIOGRAPHY;  Surgeon: Swaziland, Peter M, MD;  Location: [facility] INVASIVE CV LAB;  Service: Cardiovascular;  Laterality: N/A;   NOSE SURGERY     x 2    OTHER SURGICAL HISTORY     Breast Duct removed   PARTIAL HYSTERECTOMY     RECTOCELE REPAIR     TEE  WITHOUT CARDIOVERSION N/A 11/14/2021   Procedure: TRANSESOPHAGEAL ECHOCARDIOGRAM (TEE);  Surgeon: Lovett Sox, MD;  Location: [facility] OR;  Service: Open Heart Surgery;  Laterality: N/A;   TONSILLECTOMY     VASCULAR SURGERY Right    right leg   VEIN SURGERY Right    leg   Past Surgical History:  Procedure Laterality Date   ABDOMINAL HYSTERECTOMY     partial   ACHILLES TENDON SURGERY Left    APPENDECTOMY     BACK SURGERY     BLADDER SUSPENSION     BREAST SURGERY Right 2003   milk duct removed   CARDIAC CATHETERIZATION     CHOLECYSTECTOMY     CORONARY ARTERY BYPASS GRAFT N/A 11/14/2021   Procedure: CORONARY ARTERY BYPASS GRAFTING (CABG) TIMES THREE USING LIMA AND CRYO VEIN.;  Surgeon: Lovett Sox, MD;  Location: [facility] OR;  Service: Open Heart Surgery;  Laterality: N/A;   FRACTURE SURGERY     collar bone   LEFT HEART CATH AND CORONARY ANGIOGRAPHY N/A 03/08/2021   Procedure: LEFT HEART CATH AND CORONARY ANGIOGRAPHY;  Surgeon: Swaziland, Peter M, MD;  Location: [facility] INVASIVE CV LAB;  Service: Cardiovascular;  Laterality: N/A;   NOSE SURGERY     x 2    OTHER SURGICAL HISTORY     Breast Duct removed   PARTIAL HYSTERECTOMY     RECTOCELE REPAIR     TEE WITHOUT CARDIOVERSION N/A 11/14/2021   Procedure: TRANSESOPHAGEAL ECHOCARDIOGRAM (TEE);  Surgeon: Lovett Sox, MD;  Location: [facility] OR;  Service: Open Heart Surgery;  Laterality: N/A;   TONSILLECTOMY     VASCULAR SURGERY Right    right leg   VEIN SURGERY Right    leg   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HCC)    Anxiety    Arthritis    Diabetes mellitus without complication (HCC)    GERD (gastroesophageal reflux disease)    Heart murmur    Hyperlipidemia    Hypertension    Neuromuscular disorder (HCC)    Palpitations    Pneumonia    PONV (postoperative nausea and vomiting)    PTSD (post-traumatic stress disorder)    Ringing of ears, left    BP (!) 141/85   Pulse 65   Ht 5\' 8"  (1.727 m)   Wt 174 lb  (78.9 kg)   SpO2 98%   BMI 26.46 kg/m   Opioid Risk Score:   Fall Risk Score:  `1  Depression screen [facility] 2/9     01/12/2023    4:26 PM 12/25/2022    3:32 PM 12/01/2022  3:37 PM 08/28/2022    2:09 PM 05/13/2022    2:07 PM 05/06/2022    2:23 PM 01/18/2022   12:53 PM  Depression screen PHQ 2/9  Decreased Interest 0 0 0 0 0 0 0  Down, Depressed, Hopeless 0 0 2 0 0 0 0  PHQ - 2 Score 0 0 2 0 0 0 0  Altered sleeping 0  2  0  1  Tired, decreased energy 0  2  0  1  Change in appetite 0  2  0  0  Feeling bad or failure about yourself  0  0  0  1  Trouble concentrating 0  1  0  0  Moving slowly or fidgety/restless 0  0  0  0  Suicidal thoughts 0  0  0  0  PHQ-9 Score 0  9  0  3  Difficult doing work/chores Not difficult at all  Somewhat difficult  Not difficult at all  Somewhat difficult      Review of Systems     Objective:   Physical Exam        Assessment & Plan:

## 2023-02-25 ENCOUNTER — Encounter: Payer: Self-pay | Admitting: Registered Nurse

## 2023-02-27 ENCOUNTER — Ambulatory Visit (HOSPITAL_COMMUNITY): Payer: Medicare Other

## 2023-02-27 ENCOUNTER — Encounter (HOSPITAL_COMMUNITY): Payer: Medicare Other | Admitting: Cardiology

## 2023-02-28 LAB — DRUG TOX MONITOR 1 W/CONF, ORAL FLD
Alprazolam: NEGATIVE ng/mL (ref <0.50)
Amphetamines: NEGATIVE ng/mL (ref <10)
Barbiturates: NEGATIVE ng/mL (ref <10)
Benzodiazepines: POSITIVE ng/mL — AB (ref <0.50)
Buprenorphine: NEGATIVE ng/mL (ref <0.10)
Chlordiazepoxide: NEGATIVE ng/mL (ref <0.50)
Clonazepam: 1.66 ng/mL — ABNORMAL HIGH (ref <0.50)
Cocaine: NEGATIVE ng/mL (ref <5.0)
Codeine: NEGATIVE ng/mL (ref <2.5)
Diazepam: NEGATIVE ng/mL (ref <0.50)
Dihydrocodeine: 5.2 ng/mL — ABNORMAL HIGH (ref <2.5)
Fentanyl: NEGATIVE ng/mL (ref <0.10)
Flunitrazepam: NEGATIVE ng/mL (ref <0.50)
Flurazepam: NEGATIVE ng/mL (ref <0.50)
Heroin Metabolite: NEGATIVE ng/mL (ref <1.0)
Hydrocodone: 91.8 ng/mL — ABNORMAL HIGH (ref <2.5)
Hydromorphone: NEGATIVE ng/mL (ref <2.5)
Lorazepam: NEGATIVE ng/mL (ref <0.50)
MARIJUANA: NEGATIVE ng/mL (ref <2.5)
MDMA: NEGATIVE ng/mL (ref <10)
Meprobamate: NEGATIVE ng/mL (ref <2.5)
Methadone: NEGATIVE ng/mL (ref <5.0)
Midazolam: NEGATIVE ng/mL (ref <0.50)
Morphine: NEGATIVE ng/mL (ref <2.5)
Nicotine Metabolite: NEGATIVE ng/mL (ref <5.0)
Nordiazepam: NEGATIVE ng/mL (ref <0.50)
Norhydrocodone: 6.3 ng/mL — ABNORMAL HIGH (ref <2.5)
Noroxycodone: NEGATIVE ng/mL (ref <2.5)
Opiates: POSITIVE ng/mL — AB (ref <2.5)
Oxazepam: NEGATIVE ng/mL (ref <0.50)
Oxycodone: NEGATIVE ng/mL (ref <2.5)
Oxymorphone: NEGATIVE ng/mL (ref <2.5)
Phencyclidine: NEGATIVE ng/mL (ref <10)
Tapentadol: NEGATIVE ng/mL (ref <5.0)
Temazepam: NEGATIVE ng/mL (ref <0.50)
Tramadol: NEGATIVE ng/mL (ref <5.0)
Triazolam: NEGATIVE ng/mL (ref <0.50)
Zolpidem: NEGATIVE ng/mL (ref <5.0)

## 2023-02-28 LAB — DRUG TOX ALC METAB W/CON, ORAL FLD: Alcohol Metabolite: NEGATIVE ng/mL (ref <25)

## 2023-03-09 ENCOUNTER — Ambulatory Visit: Payer: Medicare Other | Admitting: Family Medicine

## 2023-03-12 ENCOUNTER — Encounter: Payer: Self-pay | Admitting: Family Medicine

## 2023-03-12 DIAGNOSIS — M47816 Spondylosis without myelopathy or radiculopathy, lumbar region: Secondary | ICD-10-CM | POA: Diagnosis not present

## 2023-03-13 ENCOUNTER — Other Ambulatory Visit: Payer: Self-pay | Admitting: Family Medicine

## 2023-03-13 ENCOUNTER — Ambulatory Visit: Payer: Medicare Other | Admitting: Family Medicine

## 2023-03-13 DIAGNOSIS — F3341 Major depressive disorder, recurrent, in partial remission: Secondary | ICD-10-CM

## 2023-03-13 DIAGNOSIS — G4700 Insomnia, unspecified: Secondary | ICD-10-CM

## 2023-03-13 MED ORDER — ONDANSETRON 4 MG PO TBDP: 4.0000 mg | ORAL_TABLET | Freq: Every day | ORAL | 2 refills | Status: DC | PRN

## 2023-03-13 MED ORDER — FLUTICASONE PROPIONATE 50 MCG/ACT NA SUSP: 2.0000 | Freq: Every day | NASAL | 11 refills | Status: AC

## 2023-03-19 DIAGNOSIS — K080 Exfoliation of teeth due to systemic causes: Secondary | ICD-10-CM | POA: Diagnosis not present

## 2023-03-30 ENCOUNTER — Encounter: Payer: Self-pay | Admitting: Family Medicine

## 2023-03-30 DIAGNOSIS — M47816 Spondylosis without myelopathy or radiculopathy, lumbar region: Secondary | ICD-10-CM | POA: Diagnosis not present

## 2023-03-30 DIAGNOSIS — G4700 Insomnia, unspecified: Secondary | ICD-10-CM

## 2023-03-30 DIAGNOSIS — F3341 Major depressive disorder, recurrent, in partial remission: Secondary | ICD-10-CM

## 2023-03-31 ENCOUNTER — Other Ambulatory Visit: Payer: Self-pay | Admitting: Family Medicine

## 2023-03-31 MED ORDER — QUETIAPINE FUMARATE 100 MG PO TABS
ORAL_TABLET | ORAL | 3 refills | Status: DC
Start: 2023-03-31 — End: 2024-01-11

## 2023-04-02 DIAGNOSIS — M47816 Spondylosis without myelopathy or radiculopathy, lumbar region: Secondary | ICD-10-CM | POA: Diagnosis not present

## 2023-04-07 ENCOUNTER — Other Ambulatory Visit: Payer: Self-pay | Admitting: Family Medicine

## 2023-04-07 DIAGNOSIS — G2581 Restless legs syndrome: Secondary | ICD-10-CM

## 2023-04-07 DIAGNOSIS — M47816 Spondylosis without myelopathy or radiculopathy, lumbar region: Secondary | ICD-10-CM | POA: Diagnosis not present

## 2023-04-14 ENCOUNTER — Encounter: Payer: Self-pay | Admitting: Family Medicine

## 2023-04-14 ENCOUNTER — Ambulatory Visit: Payer: Medicare Other | Admitting: Family Medicine

## 2023-04-14 VITALS — BP 122/80 | HR 73 | Temp 97.7°F | Resp 16 | Ht 68.0 in | Wt 170.0 lb

## 2023-04-14 DIAGNOSIS — I10 Essential (primary) hypertension: Secondary | ICD-10-CM

## 2023-04-14 DIAGNOSIS — E0590 Thyrotoxicosis, unspecified without thyrotoxic crisis or storm: Secondary | ICD-10-CM

## 2023-04-14 DIAGNOSIS — Z7984 Long term (current) use of oral hypoglycemic drugs: Secondary | ICD-10-CM

## 2023-04-14 DIAGNOSIS — M545 Low back pain, unspecified: Secondary | ICD-10-CM

## 2023-04-14 DIAGNOSIS — E1149 Type 2 diabetes mellitus with other diabetic neurological complication: Secondary | ICD-10-CM | POA: Diagnosis not present

## 2023-04-14 DIAGNOSIS — Z7985 Long-term (current) use of injectable non-insulin antidiabetic drugs: Secondary | ICD-10-CM | POA: Diagnosis not present

## 2023-04-14 DIAGNOSIS — G2581 Restless legs syndrome: Secondary | ICD-10-CM

## 2023-04-14 DIAGNOSIS — E7800 Pure hypercholesterolemia, unspecified: Secondary | ICD-10-CM

## 2023-04-14 DIAGNOSIS — I5022 Chronic systolic (congestive) heart failure: Secondary | ICD-10-CM

## 2023-04-14 DIAGNOSIS — F419 Anxiety disorder, unspecified: Secondary | ICD-10-CM

## 2023-04-14 DIAGNOSIS — G4700 Insomnia, unspecified: Secondary | ICD-10-CM

## 2023-04-14 LAB — POC URINALSYSI DIPSTICK (AUTOMATED)
Bilirubin, UA: NEGATIVE
Blood, UA: NEGATIVE
Glucose, UA: POSITIVE — AB
Ketones, UA: NEGATIVE
Leukocytes, UA: NEGATIVE
Nitrite, UA: NEGATIVE
Protein, UA: NEGATIVE
Spec Grav, UA: 1.01 (ref 1.010–1.025)
Urobilinogen, UA: 0.2 U/dL
pH, UA: 5.5 (ref 5.0–8.0)

## 2023-04-14 LAB — POCT GLYCOSYLATED HEMOGLOBIN (HGB A1C): HbA1c, POC (prediabetic range): 5.9 % (ref 5.7–6.4)

## 2023-04-14 MED ORDER — ROPINIROLE HCL 0.5 MG PO TABS
0.5000 mg | ORAL_TABLET | Freq: Every day | ORAL | 1 refills | Status: DC
Start: 2023-04-14 — End: 2023-10-26

## 2023-04-14 NOTE — Patient Instructions (Addendum)
 A few things to remember from today's visit:  Type 2 diabetes mellitus with neurological complications (HCC) - Plan: POC HgB A1c  Essential hypertension  Hyperthyroidism - Plan: T4, free, TSH  Anxiety disorder, unspecified type  Insomnia, unspecified type  Flank pain - Plan: POCT Urinalysis Dipstick (Automated)  No changes today.  If you need refills for medications you take chronically, please call your pharmacy. Do not use My Chart to request refills or for acute issues that need immediate attention. If you send a my chart message, it may take a few days to be addressed, specially if I am not in the office.  Please be sure medication list is accurate. If a new problem present, please set up appointment sooner than planned today.

## 2023-04-14 NOTE — Assessment & Plan Note (Addendum)
 Last echo in 02/2022: LVEF 45 to 50% and grade 1 diastolic dysfunction. Currently on Jardiance 10 mg and metoprolol-succinate 25 mg 2 tabs bid.  Following with cardiologist.

## 2023-04-14 NOTE — Assessment & Plan Note (Addendum)
 BP adequately controlled. Continue  metoprolol-succinate 25 mg 2 tabs bid and low-salt diet. Monitor BP regularly. Eye exam is current.

## 2023-04-14 NOTE — Assessment & Plan Note (Signed)
 Problem has been well controlled, last HgA1C went from 6.0 to 5.9. For now continue Jardiance 10 mg and Trulicity 1.5 mg weekly. Annual eye exam, periodic dental and foot care to continue. F/U in 5-6 months.

## 2023-04-14 NOTE — Assessment & Plan Note (Signed)
 She reports great improvement with Requip 0.5 mg daily at bedtime, no changes today.

## 2023-04-14 NOTE — Assessment & Plan Note (Signed)
 Currently she is on Zetia 10 mg daily. She has not tolerated statins in the past and recently tried Repatha, which also caused generalized myalgias and fatigue, so she discontinued.

## 2023-04-14 NOTE — Progress Notes (Signed)
 HPI: [name] is a 73 y.o. female with a PMHx significant for chronic pain, peripheral neuropathy, anxiety, PVC's, CAD, HFrEF, DM II, hyperthyroidism, and HTN, among many others, who is here today for chronic disease management.  Last seen on 12/01/2022  Diabetes Mellitus II: Diagnosed in 2014. - Checking BG at home: She has not been checking her blood sugar regularly at home.  - Medications: Currently on Trulicity  1.5 mg weekly and Jardiance  10 mg daily.  - Diet: She says her diet has significantly improved and she has lost about 12 lbs.  - eye exam: UTD on routine vision care. She had an eye exam last summer and is planning to have cataract surgery this spring.  - foot exam: 08/2022 - Negative for symptoms of hypoglycemia, polyuria, polydipsia, foot ulcers/trauma, diarrhea, or tremor. Feet burning-like sensation stable.  Lab Results  Component Value Date   HGBA1C 6.4 04/14/2023   Lab Results  Component Value Date   MICROALBUR 0.4 01/17/2022   Hypertension:  Medications: Currently on metoprolol -succinate 25 mg 2 tabs bid.  BP readings at home: She does not check her BP regularly at home but sees several doctors and always has it checked at appointments.  Side effects: none Negative for unusual or severe headache, visual changes, exertional chest pain, dyspnea,  focal weakness, or edema. HFrEF: Negative for orthopnea or PND. Last echo in 02/2022: LVEF 45 to 50% and grade 1 diastolic dysfunction.  Lab Results  Component Value Date   CREATININE 0.96 10/02/2022   BUN 19 10/02/2022   NA 141 10/02/2022   K 4.7 10/02/2022   CL 107 10/02/2022   CO2 26 10/02/2022   Hyperlipidemia: Currently on Zetia  10 mg daily. She is not taking Repatha  sureclick.  Side effects from medication: She was having severe joint pains and fatigue with Repatha  before she quit it.  Lab Results  Component Value Date   CHOL 175 10/02/2022   HDL 47 10/02/2022   LDLCALC 72 10/02/2022    LDLDIRECT 140.0 01/28/2021   TRIG 282 (H) 10/02/2022   CHOLHDL 3.7 10/02/2022   Hyperthyroidism: Currently she is not on pharmacologic treatment. Free T4 has been in normal range. She denies tremor and diarrhea. History of PVCs, probably has been stable and recently evaluated by electrophysiologist.  Lab Results  Component Value Date   TSH 0.12 (L) 11/05/2022   Anxiety/insomnia:  Currently on clonazepam  1 mg bid prn and seroquel  50 mg nightly for insomnia. She says the combination works well.  Reports that she is a good place at this time.   Restless leg syndrome:  Currently on Reqiup 0.5 mg daily.  Reports that LE discomfort has greatly improved. Negative for edema or rash.  Peripheral neuropathy: Following with neurologist. Currently on Gabapentin  600 mg 4x daily. She is planning to have an electromyography.   Chronic pain:  Currently on hydrocodone -acetaminophen  10-325 mg every 6 hours prn.  She is planning to have an nerve ablation in her back this year.  She sees physical therapy 2 times per week to work on her leg strength and posture.  Follows with pain management.  She mentions she is still working part time.   Concerns today:   Patient complains of right-sided lower back pain when twisting when she gets up to use the bathroom at night.  Pain is exacerbated by movement after prolonged rest and alleviated by rest.  She is concerned about possible UTI. Negative for dysuria, increased urinary frequency, or gross hematuria.  Review  of Systems  Constitutional:  Positive for fatigue. Negative for activity change, chills and fever.  HENT:  Negative for mouth sores and sore throat.   Respiratory:  Negative for cough and wheezing.   Gastrointestinal:  Negative for abdominal pain, nausea and vomiting.  Endocrine: Negative for cold intolerance and heat intolerance.  Genitourinary:  Negative for decreased urine volume and difficulty urinating.  Musculoskeletal:  Positive  for arthralgias and back pain.  Skin:  Negative for rash.  Neurological:  Negative for syncope and facial asymmetry.  Psychiatric/Behavioral:  Negative for confusion and hallucinations. The patient is nervous/anxious.   See other pertinent positives and negatives in HPI.  Current Outpatient Medications on File Prior to Visit  Medication Sig Dispense Refill   albuterol  (VENTOLIN  HFA) 108 (90 Base) MCG/ACT inhaler Inhale 2 puffs into the lungs every 6 (six) hours as needed for wheezing or shortness of breath. 18 g 2   Alcohol  Swabs (B-D SINGLE USE SWABS REGULAR) PADS Use to test blood sugar 3 times daily 100 each 3   Blood Glucose Monitoring Suppl (ONETOUCH VERIO FLEX SYSTEM) w/Device KIT Use to test blood sugars 1-2 times daily. 1 kit 0   clonazePAM  (KLONOPIN ) 1 MG tablet TAKE 1 TABLET(1 MG) BY MOUTH TWICE DAILY AS NEEDED FOR ANXIETY 60 tablet 3   clopidogrel  (PLAVIX ) 75 MG tablet Take 1 tablet (75 mg total) by mouth daily. 90 tablet 3   Dulaglutide  (TRULICITY ) 1.5 MG/0.5ML SOAJ INJECT 1.5 MG (0.5ML) UNDER THE SKIN ONCE A WEEK 6 mL 2   Evolocumab  (REPATHA  SURECLICK) 140 MG/ML SOAJ Inject 140 mg into the skin every 14 (fourteen) days. 6 mL 3   fluticasone  (FLONASE ) 50 MCG/ACT nasal spray Place 2 sprays into both nostrils daily. 16 g 11   furosemide  (LASIX ) 20 MG tablet Take 20 mg by mouth daily as needed.     gabapentin  (NEURONTIN ) 600 MG tablet TAKE 1 TABLET BY MOUTH EVERY MORNING AND AT NOON AND EVERY EVENING AND AT BEDTIME 120 tablet 3   guaiFENesin  (MUCINEX ) 600 MG 12 hr tablet Take 600 mg by mouth daily.     HYDROcodone -acetaminophen  (NORCO) 10-325 MG tablet Take 1 tablet by mouth every 6 (six) hours as needed. 120 tablet 0   JARDIANCE  10 MG TABS tablet Take 1 tablet (10 mg total) by mouth daily. 90 tablet 3   Lancets (ONETOUCH ULTRASOFT) lancets Use to test blood sugar 1-2 times daily. 100 each 12   melatonin 5 MG TABS Take 1 tablet (5 mg total) by mouth at bedtime as needed.  0    metoprolol  succinate (TOPROL -XL) 25 MG 24 hr tablet Take 50 mg by mouth 2 (two) times daily.     ondansetron  (ZOFRAN -ODT) 4 MG disintegrating tablet Take 1 tablet (4 mg total) by mouth daily as needed for nausea or vomiting. 20 tablet 2   pantoprazole  (PROTONIX ) 40 MG tablet Take 1 tablet (40 mg total) by mouth daily. 90 tablet 3   Polyethyl Glycol-Propyl Glycol (SYSTANE) 0.4-0.3 % SOLN Place 1 drop into both eyes daily as needed (Dry eye).     potassium chloride  SA (KLOR-CON  M20) 20 MEQ tablet Take 1 tablet (20 mEq total) by mouth daily. 30 tablet 5   QUEtiapine  (SEROQUEL ) 100 MG tablet TAKE 1/2 (ONE-HALF) TABLET BY MOUTH AT BEDTIME 45 tablet 3   triamcinolone  (KENALOG ) 0.1 % paste Use as directed 1 Application in the mouth or throat daily as needed (mouth pain).     ezetimibe  (ZETIA ) 10 MG tablet Take 1  tablet (10 mg total) by mouth daily. (Patient not taking: Reported on 01/15/2023) 90 tablet 3   No current facility-administered medications on file prior to visit.    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HCC)    Anxiety    Arthritis    Diabetes mellitus without complication (HCC)    GERD (gastroesophageal reflux disease)    Heart murmur    Hyperlipidemia    Hypertension    Neuromuscular disorder (HCC)    Palpitations    Pneumonia    PONV (postoperative nausea and vomiting)    PTSD (post-traumatic stress disorder)    Ringing of ears, left    Allergies  Allergen Reactions   Hydromorphone  Nausea And Vomiting and Other (See Comments)    Made the patient feel spaced out,  Other Reaction(s): GI Intolerance, Other (See Comments)  Made the patient feel spaced out, also   Morphine  And Codeine Itching    With high doses   Statins     Hx intolerance to multiple statins   Penicillins Rash    Has patient had a PCN reaction causing immediate rash, facial/tongue/throat swelling, SOB or lightheadedness with hypotension: Yes  Has patient had a PCN reaction  causing severe rash involving mucus membranes or skin necrosis: No  Has patient had a PCN reaction that required hospitalization: No  Has patient had a PCN reaction occurring within the last 10 years: No  If all of the above answers are NO, then may proceed with Cephalosporin use.  Has patient had a PCN reaction causing immediate rash, facial/tongue/throat swelling, SOB or lightheadedness with hypotension: Yes, Has patient had a PCN reaction causing severe rash involving mucus membranes or skin necrosis: No, Has patient had a PCN reaction that required hospitalization: No, Has patient had a PCN reaction occurring within the last 10 years: No, If all of the above answers are NO, then may proceed with Cephalosporin use.    Social History   Socioeconomic History   Marital status: Widowed    Spouse name: Not on file   Number of children: 1   Years of education: Not on file   Highest education level: Some college, no degree  Occupational History   Occupation: retired    Comment: work part time  Tobacco Use   Smoking status: Never   Smokeless tobacco: Never   Tobacco comments:    Never smoke 11/01/21  Vaping Use   Vaping status: Never Used  Substance and Sexual Activity   Alcohol  use: Never   Drug use: No   Sexual activity: Not on file  Other Topics Concern   Not on file  Social History Narrative   Not on file   Social Drivers of Health   Financial Resource Strain: Medium Risk (04/13/2023)   Overall Financial Resource Strain (CARDIA)    Difficulty of Paying Living Expenses: Somewhat hard  Food Insecurity: Patient Declined (04/13/2023)   Hunger Vital Sign    Worried About Running Out of Food in the Last Year: Patient declined    Ran Out of Food in the Last Year: Patient declined  Transportation Needs: No Transportation Needs (04/13/2023)   PRAPARE - Administrator, Civil Service (Medical): No    Lack of Transportation (Non-Medical): No  Physical Activity:  Insufficiently Active (04/13/2023)   Exercise Vital Sign    Days of Exercise per Week: 3 days    Minutes of Exercise per Session: 10 min  Stress: Stress Concern Present (04/13/2023)  [employer] of Occupational Health - Occupational Stress Questionnaire    Feeling of Stress : Rather much  Social Connections: Moderately Isolated (04/13/2023)   Social Connection and Isolation Panel [NHANES]    Frequency of Communication with Friends and Family: Twice a week    Frequency of Social Gatherings with Friends and Family: Once a week    Attends Religious Services: More than 4 times per year    Active Member of [employer] or Organizations: No    Attends Banker Meetings: Not on file    Marital Status: Widowed    Vitals:   04/14/23 1458  BP: 122/80  Pulse: 73  Resp: 16  Temp: 97.7 F (36.5 C)  SpO2: 99%   Body mass index is 25.85 kg/m.  Physical Exam Vitals and nursing note reviewed.  Constitutional:      General: She is not in acute distress.    Appearance: She is well-developed.  HENT:     Head: Normocephalic and atraumatic.     Mouth/Throat:     Mouth: Mucous membranes are moist.     Pharynx: Oropharynx is clear. Uvula midline.  Eyes:     Conjunctiva/sclera: Conjunctivae normal.  Cardiovascular:     Rate and Rhythm: Normal rate and regular rhythm.     Pulses:          Dorsalis pedis pulses are 2+ on the right side and 2+ on the left side.       Posterior tibial pulses are 2+ on the right side and 2+ on the left side.     Heart sounds: No murmur heard. Pulmonary:     Effort: Pulmonary effort is normal. No respiratory distress.     Breath sounds: Normal breath sounds.  Abdominal:     Palpations: Abdomen is soft. There is no hepatomegaly or mass.     Tenderness: There is no abdominal tenderness.  Musculoskeletal:     Right lower leg: No edema.     Left lower leg: No edema.  Lymphadenopathy:     Cervical: No cervical adenopathy.  Skin:    General: Skin is warm.      Findings: No erythema or rash.  Neurological:     General: No focal deficit present.     Mental Status: She is alert and oriented to person, place, and time.     Cranial Nerves: No cranial nerve deficit.     Gait: Gait normal.  Psychiatric:     Comments: Well groomed, good eye contact.    ASSESSMENT AND PLAN:  Ms. Hollinshead was seen today for chronic disease management.   Orders Placed This Encounter  Procedures   Basic metabolic panel   POCT Urinalysis Dipstick (Automated)   POC HgB A1c   Lab Results  Component Value Date   HGBA1C 5.9 04/14/2023   Lab Results  Component Value Date   TSH 0.86 04/14/2023   Lab Results  Component Value Date   NA 138 04/14/2023   CL 102 04/14/2023   K 3.4 (L) 04/14/2023   CO2 26 04/14/2023   BUN 15 04/14/2023   CREATININE 0.99 04/14/2023   GFR 57.46 (L) 04/14/2023   CALCIUM  9.4 04/14/2023   PHOS 4.4 09/04/2021   ALBUMIN  4.2 11/12/2021   GLUCOSE 138 (H) 04/14/2023   Type 2 diabetes mellitus with neurological complications (HCC) Assessment & Plan: Problem has been well controlled, last HgA1C went from 6.0 to 5.9. For now continue Jardiance  10 mg and Trulicity  1.5 mg weekly. Annual eye  exam, periodic dental and foot care to continue. F/U in 5-6 months.  Orders: -     POCT glycosylated hemoglobin (Hb A1C)  Essential hypertension Assessment & Plan: BP adequately controlled. Continue metoprolol  succinate 50 mg twice daily and low-salt diet. Monitor BP regularly. Eye exam is current.  Orders: -     Basic metabolic panel; Future  Hyperthyroidism Assessment & Plan: TSH and free T4 ordered last time were not done.  TSH abnormalities started on 03/17/2022 at 0.04, 0.03 in 04/2022, 0.04 in 05/2022, and 0.12 in 10/2022. We had decided to hold on endocrinology consultation for now. She has reported some weight loss, which seems to be attributed to decreased oral intake and following a healthier diet. Further recommendation will be  given according to lab results.  Orders: -     T4, free -     TSH  Anxiety disorder, unspecified type Assessment & Plan: Problem is overall stable. Continue clonazepam  1 mg twice daily as needed. PDMP reviewed. Follow-up in 5 to 6 months, before if needed.  Insomnia, unspecified type Assessment & Plan: Problem has been adequately controlled. Continue Seroquel  100 mg 1/2 tablet daily at bedtime and good sleep hygiene.  Right low back pain, unspecified chronicity, unspecified whether sciatica present She requested urine test concerned about possible UTI. Urine dipstick was negative except for presence of glucose (she is on Jardiance ). History suggest musculoskeletal pain.  -     POCT Urinalysis Dipstick (Automated)  RLS (restless legs syndrome) Assessment & Plan: She reports great improvement with Requip  0.5 mg daily at bedtime, no changes today.  Orders: -     rOPINIRole  HCl; Take 1 tablet (0.5 mg total) by mouth at bedtime.  Dispense: 90 tablet; Refill: 1  HYPERCHOLESTEROLEMIA Assessment & Plan: Currently she is on Zetia  10 mg daily. She has not tolerated statins in the past and recently tried Repatha , which also caused generalized myalgias and fatigue, so she discontinued.  Chronic systolic CHF (congestive heart failure) (HCC)  Last echo in 02/2022: LVEF 45 to 50% and grade 1 diastolic dysfunction. Currently on Jardiance  10 mg and Metoprolol  succinate 25 mg 2 tabs bid. Following with cardiologist.   I spent a total of 47 minutes in both face to face and non face to face activities for this visit on the date of this encounter. During this time history was obtained and documented, examination was performed, prior labs reviewed, and assessment/plan discussed. She states that she is not longer interested in having colon and mammogram screening.  Return in about 6 months (around 10/12/2023) for chronic problems.  I, Leonce PARAS Wierda, acting as a scribe for Timesha Cervantez, MD.,  have documented all relevant documentation on the behalf of Jeanette Oriordan, MD, as directed by  Sabine Tenenbaum, MD while in the presence of Staisha Winiarski, MD.   I, Demitris Pokorny, MD, have reviewed all documentation for this visit. The documentation on 04/14/23 for the exam, diagnosis, procedures, and orders are all accurate and complete.  Macie Baum G. Maytte Jacot, MD  [facility]. [place] office.

## 2023-04-14 NOTE — Assessment & Plan Note (Signed)
 Problem has been adequately controlled. Continue Seroquel 100 mg 1/2 tablet daily at bedtime and good sleep hygiene.

## 2023-04-14 NOTE — Assessment & Plan Note (Signed)
 TSH and free T4 ordered last time were not done.  TSH abnormalities started on 03/17/2022 at 0.04, 0.03 in 04/2022, 0.04 in 05/2022, and 0.12 in 10/2022. We had decided to hold on endocrinology consultation for now. She has reported some weight loss, which seems to be attributed to decreased oral intake and following a healthier diet. Further recommendation will be given according to lab results.

## 2023-04-14 NOTE — Assessment & Plan Note (Signed)
 Problem is overall stable. Continue clonazepam 1 mg twice daily as needed. PDMP reviewed. Follow-up in 5 to 6 months, before if needed.

## 2023-04-15 LAB — BASIC METABOLIC PANEL
BUN: 15 mg/dL (ref 6–23)
CO2: 26 meq/L (ref 19–32)
Calcium: 9.4 mg/dL (ref 8.4–10.5)
Chloride: 102 meq/L (ref 96–112)
Creatinine, Ser: 0.99 mg/dL (ref 0.40–1.20)
GFR: 57.46 mL/min — ABNORMAL LOW (ref >60.00)
Glucose, Bld: 138 mg/dL — ABNORMAL HIGH (ref 70–99)
Potassium: 3.4 meq/L — ABNORMAL LOW (ref 3.5–5.1)
Sodium: 138 meq/L (ref 135–145)

## 2023-04-15 LAB — TSH: TSH: 0.86 u[IU]/mL (ref 0.35–5.50)

## 2023-04-15 LAB — T4, FREE: Free T4: 0.74 ng/dL (ref 0.60–1.60)

## 2023-04-22 ENCOUNTER — Encounter: Payer: Self-pay | Admitting: Family Medicine

## 2023-04-22 DIAGNOSIS — F32A Depression, unspecified: Secondary | ICD-10-CM

## 2023-04-23 ENCOUNTER — Emergency Department (HOSPITAL_COMMUNITY): Payer: Medicare Other

## 2023-04-23 ENCOUNTER — Other Ambulatory Visit: Payer: Self-pay

## 2023-04-23 ENCOUNTER — Emergency Department (HOSPITAL_COMMUNITY)
Admission: EM | Admit: 2023-04-23 | Discharge: 2023-04-23 | Disposition: A | Discharge status: Home / Self Care | Payer: Medicare Other | Attending: Emergency Medicine | Admitting: Emergency Medicine

## 2023-04-23 ENCOUNTER — Encounter (HOSPITAL_COMMUNITY): Payer: Self-pay | Admitting: Emergency Medicine

## 2023-04-23 DIAGNOSIS — M1711 Unilateral primary osteoarthritis, right knee: Secondary | ICD-10-CM | POA: Diagnosis not present

## 2023-04-23 DIAGNOSIS — M25561 Pain in right knee: Secondary | ICD-10-CM | POA: Diagnosis not present

## 2023-04-23 DIAGNOSIS — S42251A Displaced fracture of greater tuberosity of right humerus, initial encounter for closed fracture: Secondary | ICD-10-CM | POA: Insufficient documentation

## 2023-04-23 DIAGNOSIS — M25562 Pain in left knee: Secondary | ICD-10-CM | POA: Insufficient documentation

## 2023-04-23 DIAGNOSIS — M79642 Pain in left hand: Secondary | ICD-10-CM | POA: Diagnosis not present

## 2023-04-23 DIAGNOSIS — Y990 Civilian activity done for income or pay: Secondary | ICD-10-CM | POA: Diagnosis not present

## 2023-04-23 DIAGNOSIS — M19042 Primary osteoarthritis, left hand: Secondary | ICD-10-CM | POA: Diagnosis not present

## 2023-04-23 DIAGNOSIS — E119 Type 2 diabetes mellitus without complications: Secondary | ICD-10-CM | POA: Diagnosis not present

## 2023-04-23 DIAGNOSIS — Z951 Presence of aortocoronary bypass graft: Secondary | ICD-10-CM | POA: Insufficient documentation

## 2023-04-23 DIAGNOSIS — M19011 Primary osteoarthritis, right shoulder: Secondary | ICD-10-CM | POA: Diagnosis not present

## 2023-04-23 DIAGNOSIS — S4991XA Unspecified injury of right shoulder and upper arm, initial encounter: Secondary | ICD-10-CM | POA: Diagnosis not present

## 2023-04-23 DIAGNOSIS — I10 Essential (primary) hypertension: Secondary | ICD-10-CM | POA: Diagnosis not present

## 2023-04-23 DIAGNOSIS — I2510 Atherosclerotic heart disease of native coronary artery without angina pectoris: Secondary | ICD-10-CM | POA: Diagnosis not present

## 2023-04-23 DIAGNOSIS — W19XXXA Unspecified fall, initial encounter: Secondary | ICD-10-CM | POA: Diagnosis not present

## 2023-04-23 DIAGNOSIS — S42291A Other displaced fracture of upper end of right humerus, initial encounter for closed fracture: Secondary | ICD-10-CM | POA: Diagnosis not present

## 2023-04-23 DIAGNOSIS — S42201A Unspecified fracture of upper end of right humerus, initial encounter for closed fracture: Secondary | ICD-10-CM | POA: Diagnosis not present

## 2023-04-23 DIAGNOSIS — S80211A Abrasion, right knee, initial encounter: Secondary | ICD-10-CM | POA: Diagnosis not present

## 2023-04-23 DIAGNOSIS — S60222A Contusion of left hand, initial encounter: Secondary | ICD-10-CM | POA: Insufficient documentation

## 2023-04-23 DIAGNOSIS — M1712 Unilateral primary osteoarthritis, left knee: Secondary | ICD-10-CM | POA: Diagnosis not present

## 2023-04-23 MED ORDER — FENTANYL CITRATE PF 50 MCG/ML IJ SOSY
25.0000 ug | PREFILLED_SYRINGE | Freq: Once | INTRAMUSCULAR | Status: AC
  Administered 2023-04-23: 25 ug via INTRAVENOUS
  Filled 2023-04-23: qty 1

## 2023-04-23 MED ORDER — OXYCODONE HCL 5 MG PO TABS
5.0000 mg | ORAL_TABLET | Freq: Once | ORAL | Status: DC
  Filled 2023-04-23: qty 1

## 2023-04-23 MED ORDER — LIDOCAINE 5 % EX PTCH
1.0000 | MEDICATED_PATCH | CUTANEOUS | Status: DC
  Administered 2023-04-23: 1 via TRANSDERMAL
  Filled 2023-04-23: qty 1

## 2023-04-23 MED ORDER — KETOROLAC TROMETHAMINE 15 MG/ML IJ SOLN: 15.0000 mg | Freq: Once | INTRAMUSCULAR | Status: DC

## 2023-04-23 MED ORDER — KETOROLAC TROMETHAMINE 15 MG/ML IJ SOLN
15.0000 mg | Freq: Once | INTRAMUSCULAR | Status: AC
  Administered 2023-04-23: 15 mg via INTRAMUSCULAR
  Filled 2023-04-23: qty 1

## 2023-04-23 MED ORDER — ACETAMINOPHEN 325 MG PO TABS
650.0000 mg | ORAL_TABLET | Freq: Once | ORAL | Status: DC
  Filled 2023-04-23: qty 2

## 2023-04-23 NOTE — Discharge Instructions (Addendum)
Pleasure taking care of you today.  You were seen in the emergency department for pain after a fall.  Your x-rays did show a facture of the humeral head with mild displacement of the greater tuberosity fracture fragment and nondisplaced fracture across the base of the glenoid. Do not put weight on your shoulder and keep your arm in the sling.  Call your orthopedic office tomorrow to schedule close follow-up.  Come back for any new or worsening symptoms.

## 2023-04-23 NOTE — ED Triage Notes (Signed)
Pt slid on ice and fell into door coming to building landing on right side, denies LOC or striking head, pain to right shoulder, bilateral knee abrasions.

## 2023-04-23 NOTE — ED Provider Notes (Signed)
[place] EMERGENCY DEPARTMENT AT [facility] Provider Note   CSN: [id number] Arrival date & time: 04/23/23  1057     History  Chief Complaint  Patient presents with   [name]    [name] is a 73 y.o. female.  She has PMH of type 2 diabetes, high cholesterol, GERD, chronic back pain on hydrocodone daily, CAD with CABG x 3 and PVCs.  Presents the ER today complaining of bilateral knee pain and right shoulder pain after a fall.  She states she works part-time and was going to make a delivery and was trying to hurry up, and slipped on a patch of ice landing on her knees and then her right side.  Denies head injury or loss of consciousness.  She is Plavix but no Eliquis or other anticoagulants.  Denies numbness tingling or weakness.  She does also have some bruising to the palm of the left hand, no wrist pain, no limited range of motion.  She has no chest pain, no abdominal pain   Fall       Home Medications Prior to Admission medications   Medication Sig Start Date End Date Taking? Authorizing Provider  albuterol (VENTOLIN HFA) 108 (90 Base) MCG/ACT inhaler Inhale 2 puffs into the lungs every 6 (six) hours as needed for wheezing or shortness of breath. 12/24/22   [place], Betty G, MD  Alcohol Swabs (B-D SINGLE USE SWABS REGULAR) PADS Use to test blood sugar 3 times daily 08/11/17   [place], Betty G, MD  Blood Glucose Monitoring Suppl (ONETOUCH VERIO FLEX SYSTEM) w/Device KIT Use to test blood sugars 1-2 times daily. 09/04/21   [place], Betty G, MD  clonazePAM (KLONOPIN) 1 MG tablet TAKE 1 TABLET(1 MG) BY MOUTH TWICE DAILY AS NEEDED FOR ANXIETY 12/02/22   [place], Betty G, MD  clopidogrel (PLAVIX) 75 MG tablet Take 1 tablet (75 mg total) by mouth daily. 08/04/22   Laurey Morale, MD  Dulaglutide (TRULICITY) 1.5 MG/0.5ML SOAJ INJECT 1.5 MG (0.5ML) UNDER THE SKIN ONCE A WEEK 03/31/23   [place], Betty G, MD  Evolocumab (REPATHA SURECLICK) 140 MG/ML SOAJ Inject 140 mg into the skin  every 14 (fourteen) days. 04/25/22   Laurey Morale, MD  ezetimibe (ZETIA) 10 MG tablet Take 1 tablet (10 mg total) by mouth daily. Patient not taking: Reported on 01/15/2023 10/03/22 01/01/23  Laurey Morale, MD  fluticasone [facility]) 50 MCG/ACT nasal spray Place 2 sprays into both nostrils daily. 03/13/23   [place], Betty G, MD  furosemide (LASIX) 20 MG tablet Take 20 mg by mouth daily as needed.    [provider]  gabapentin (NEURONTIN) 600 MG tablet TAKE 1 TABLET BY MOUTH EVERY MORNING AND AT [facility] AND EVERY EVENING AND AT BEDTIME 02/23/23   Jones Bales, NP  guaiFENesin (MUCINEX) 600 MG 12 hr tablet Take 600 mg by mouth daily.    [provider]  HYDROcodone-acetaminophen (NORCO) 10-325 MG tablet Take 1 tablet by mouth every 6 (six) hours as needed. 02/23/23   Jones Bales, NP  JARDIANCE 10 MG TABS tablet Take 1 tablet (10 mg total) by mouth daily. 10/27/22   Laurey Morale, MD  Lancets [facility] ULTRASOFT) lancets Use to test blood sugar 1-2 times daily. 09/04/21   [place], Betty G, MD  melatonin 5 MG TABS Take 1 tablet (5 mg total) by mouth at bedtime as needed. 11/22/21   Barrett, Erin R, PA-C  metoprolol succinate (TOPROL-XL) 25 MG 24 hr tablet  Take 50 mg by mouth 2 (two) times daily.    [provider]  ondansetron (ZOFRAN-ODT) 4 MG disintegrating tablet Take 1 tablet (4 mg total) by mouth daily as needed for nausea or vomiting. 03/13/23   [place], Betty G, MD  pantoprazole (PROTONIX) 40 MG tablet Take 1 tablet (40 mg total) by mouth daily. 12/09/22   [place], Betty G, MD  Polyethyl Glycol-Propyl Glycol (SYSTANE) 0.4-0.3 % SOLN Place 1 drop into both eyes daily as needed (Dry eye).    [provider]  potassium chloride SA (KLOR-CON M20) 20 MEQ tablet Take 1 tablet (20 mEq total) by mouth daily. 08/25/22   [place], Betty G, MD  QUEtiapine (SEROQUEL) 100 MG tablet TAKE 1/2 (ONE-HALF) TABLET BY MOUTH AT BEDTIME 03/31/23   [place], Betty G, MD  rOPINIRole  (REQUIP) 0.5 MG tablet Take 1 tablet (0.5 mg total) by mouth at bedtime. 04/14/23   [place], Betty G, MD  triamcinolone (KENALOG) 0.1 % paste Use as directed 1 Application in the mouth or throat daily as needed (mouth pain). 11/22/21   Barrett, Erin R, PA-C      Allergies    Hydromorphone, Morphine and codeine, Statins, and Penicillins    Review of Systems   Review of Systems  Physical Exam Updated Vital Signs BP (!) 160/92   Pulse 75   Temp 98 F (36.7 C) (Oral)   Resp 20   Ht 5\' 8"  (1.727 m)   Wt 78 kg   SpO2 100%   BMI 26.15 kg/m  Physical Exam Vitals and nursing note reviewed.  Constitutional:      General: She is not in acute distress.    Appearance: She is well-developed.  HENT:     Head: Normocephalic and atraumatic.     Mouth/Throat:     Mouth: Mucous membranes are moist.  Eyes:     Extraocular Movements: Extraocular movements intact.     Conjunctiva/sclera: Conjunctivae normal.     Pupils: Pupils are equal, round, and reactive to light.  Cardiovascular:     Rate and Rhythm: Normal rate and regular rhythm.     Heart sounds: No murmur heard. Pulmonary:     Effort: Pulmonary effort is normal. No respiratory distress.     Breath sounds: Normal breath sounds.  Abdominal:     Palpations: Abdomen is soft.     Tenderness: There is no abdominal tenderness.  Musculoskeletal:     Cervical back: Neck supple.     Comments: Tenderness and swelling with limited range of motion of right shoulder, tenderness is over lateral aspect of right shoulder.  Elbow and wrist are without pain, radial pulse intact.  Ecchymosis with minimal tenderness over thenar eminence of the left hand without crepitus, no left hand snuffbox tenderness, normal range of motion of wrist and fingers.  No pain to the left lower extremity otherwise.  Mild bilateral left knee tenderness  Skin:    General: Skin is warm and dry.     Capillary Refill: Capillary refill takes less than 2 seconds.  Neurological:      General: No focal deficit present.     Mental Status: She is alert and oriented to person, place, and time.  Psychiatric:        Mood and Affect: Mood normal.     ED Results / Procedures / Treatments   Labs (all labs ordered are listed, but only abnormal results are displayed) Labs Reviewed - No data to display  EKG None  Radiology DG  Hand Complete Left Result Date: 04/23/2023 CLINICAL DATA:  Fall.  Pain. EXAM: LEFT HAND - COMPLETE 3+ VIEW COMPARISON:  Left hand radiographs 07/02/2020 FINDINGS: Mildly decreased bone mineralization. Degenerative changes including joint space narrowing, subchondral sclerosis, and peripheral osteophytosis are mild at the thumb carpometacarpal joint, mild at the thumb interphalangeal joint, moderate at the index finger DIP joint, mild-to-moderate at the third and fifth DIP joints, and mild at the fourth DIP joint and second PIP joint. No acute fracture or dislocation. IMPRESSION: 1. No acute fracture. 2. Mild-to-moderate osteoarthritis as above. Electronically Signed   By: Neita Garnet M.D.   On: 04/23/2023 14:14   DG Shoulder Right Result Date: 04/23/2023 CLINICAL DATA:  Pain after slip and fall on ice. EXAM: RIGHT SHOULDER - 2+ VIEW COMPARISON:  None Available. FINDINGS: Comminuted fractures of the right radial head across the base of the greater tuberosity with mild displacement. Minimally displaced fracture of the inferior rim of the glenoid. No dislocation of the shoulder. Degenerative changes in the glenohumeral and acromioclavicular joints. Soft tissues are unremarkable. IMPRESSION: 1. Fractures of the right humeral head with mild displacement of the greater tuberosity fracture fragment. 2. Nondisplaced fracture across the base of the glenoid. 3. Underlying degenerative changes. Electronically Signed   By: Burman Nieves M.D.   On: 04/23/2023 12:19   DG Knee 2 Views Left Result Date: 04/23/2023 CLINICAL DATA:  Left knee pain after fall. EXAM: LEFT KNEE  - 1-2 VIEW COMPARISON:  None Available. FINDINGS: No evidence of fracture, dislocation, or joint effusion. Moderate narrowing of medial joint space is noted. Soft tissues are unremarkable. IMPRESSION: Moderate degenerative joint disease medially. No acute abnormality seen. Electronically Signed   By: Lupita Raider M.D.   On: 04/23/2023 12:17   DG Knee 2 Views Right Result Date: 04/23/2023 CLINICAL DATA:  Larey Seat on ice with abrasions to the right knee. EXAM: RIGHT KNEE - 1-2 VIEW COMPARISON:  None Available. FINDINGS: Degenerative changes in the right knee with medial greater than lateral compartment narrowing and small osteophyte formation. No evidence of acute fracture or dislocation. No focal bone lesion or bone destruction. No significant effusions. Vascular calcifications. IMPRESSION: Mild degenerative changes in the medial compartment of the right knee. No acute displaced fractures identified. Electronically Signed   By: Burman Nieves M.D.   On: 04/23/2023 12:17    Procedures Procedures    Medications Ordered in ED Medications  lidocaine (LIDODERM) 5 % 1 patch (1 patch Transdermal Patch Applied 04/23/23 1350)  oxyCODONE (Oxy IR/ROXICODONE) immediate release tablet 5 mg (5 mg Oral Patient Refused/Not Given 04/23/23 1352)  acetaminophen (TYLENOL) tablet 650 mg (650 mg Oral Patient Refused/Not Given 04/23/23 1352)  fentaNYL (SUBLIMAZE) injection 25 mcg (25 mcg Intravenous Given 04/23/23 1300)  ketorolac (TORADOL) 15 MG/ML injection 15 mg (15 mg Intramuscular Given 04/23/23 1430)    ED Course/ Medical Decision Making/ A&P                                 Medical Decision Making This patient presents to the ED for concern of fall with right shoulder pain, this involves an extensive number of treatment options, and is a complaint that carries with it a high risk of complications and morbidity.  The differential diagnosis includes sprain, strain, contusion, fracture, dislocation, other   Co  morbidities that complicate the patient evaluation  CAD, chronic back pain   Additional history obtained:  Additional history  obtained from EMR External records from outside source obtained and reviewed including prior notes   Imaging Studies ordered:  I ordered imaging studies including x-ray right shoulder shows of the right humeral head with mild displacement of greater tuberosity fracture fragment and minimally displaced fracture of inferior rim of glenoid; x-ray left hand shows no fracture or dislocation, x-ray bilateral knees no fracture or dislocation I independently visualized and interpreted imaging  I agree with the radiologist interpretation     Problem List / ED Course / Critical interventions / Medication management  Patient had mechanical fall today after a slip on ice, she has some soreness to her knees and left hand but no fractures on x-ray of these areas.  She is able to ambulate but does have fracture of right proximal humerus that is comminuted with mild displacement of the greater tuberosity fragment and the mildly displaced fracture of the inferior rim of the glenoid.  Patient did not want additional pain medicine, she is on hydrocodone at home and states is that that helps her pain and she is already in pain management.  She is feeling better now that she is in a sling, she had no signs of neurovascular compromise.  Not have a head injury, has no neck pain or other injuries.  Advised on orthopedic follow-up.  She is already established with Dr. Ophelia Charter in [facility].  She was given strict return precautions. I ordered medication including Toradol for pain Reevaluation of the patient after these medicines showed that the patient improved I have reviewed the patients home medicines and have made adjustments as needed      Amount and/or Complexity of Data Reviewed Radiology: ordered.  Risk OTC drugs. Prescription drug  management.           Final Clinical Impression(s) / ED Diagnoses Final diagnoses:  Closed fracture of proximal end of right humerus, unspecified fracture morphology, initial encounter    Rx / DC Orders ED Discharge Orders     None         Josem Kaufmann 04/23/23 1555    Lonell Grandchild, MD 04/23/23 1654

## 2023-04-23 NOTE — ED Notes (Signed)
Pt ambulated to restroom. 

## 2023-04-24 ENCOUNTER — Other Ambulatory Visit: Payer: Self-pay | Admitting: Family Medicine

## 2023-04-24 DIAGNOSIS — F32A Depression, unspecified: Secondary | ICD-10-CM

## 2023-04-28 ENCOUNTER — Encounter: Payer: Self-pay | Admitting: Registered Nurse

## 2023-04-28 ENCOUNTER — Ambulatory Visit: Payer: Medicare Other | Admitting: Orthopaedic Surgery

## 2023-04-28 ENCOUNTER — Encounter: Payer: Medicare Other | Attending: Registered Nurse | Admitting: Registered Nurse

## 2023-04-28 VITALS — BP 140/77 | HR 79

## 2023-04-28 VITALS — BP 159/88 | HR 71 | Ht 68.0 in | Wt 174.0 lb

## 2023-04-28 DIAGNOSIS — F419 Anxiety disorder, unspecified: Secondary | ICD-10-CM | POA: Insufficient documentation

## 2023-04-28 DIAGNOSIS — W19XXXD Unspecified fall, subsequent encounter: Secondary | ICD-10-CM | POA: Diagnosis not present

## 2023-04-28 DIAGNOSIS — M542 Cervicalgia: Secondary | ICD-10-CM | POA: Diagnosis not present

## 2023-04-28 DIAGNOSIS — G2581 Restless legs syndrome: Secondary | ICD-10-CM | POA: Insufficient documentation

## 2023-04-28 DIAGNOSIS — M47812 Spondylosis without myelopathy or radiculopathy, cervical region: Secondary | ICD-10-CM | POA: Diagnosis not present

## 2023-04-28 DIAGNOSIS — M2550 Pain in unspecified joint: Secondary | ICD-10-CM | POA: Diagnosis not present

## 2023-04-28 DIAGNOSIS — G8929 Other chronic pain: Secondary | ICD-10-CM

## 2023-04-28 DIAGNOSIS — S42251A Displaced fracture of greater tuberosity of right humerus, initial encounter for closed fracture: Secondary | ICD-10-CM | POA: Diagnosis not present

## 2023-04-28 DIAGNOSIS — Z794 Long term (current) use of insulin: Secondary | ICD-10-CM

## 2023-04-28 DIAGNOSIS — M19012 Primary osteoarthritis, left shoulder: Secondary | ICD-10-CM | POA: Insufficient documentation

## 2023-04-28 DIAGNOSIS — M79645 Pain in left finger(s): Secondary | ICD-10-CM | POA: Insufficient documentation

## 2023-04-28 DIAGNOSIS — Z79891 Long term (current) use of opiate analgesic: Secondary | ICD-10-CM | POA: Insufficient documentation

## 2023-04-28 DIAGNOSIS — M25511 Pain in right shoulder: Secondary | ICD-10-CM | POA: Diagnosis not present

## 2023-04-28 DIAGNOSIS — E1142 Type 2 diabetes mellitus with diabetic polyneuropathy: Secondary | ICD-10-CM | POA: Insufficient documentation

## 2023-04-28 DIAGNOSIS — M545 Low back pain, unspecified: Secondary | ICD-10-CM | POA: Insufficient documentation

## 2023-04-28 DIAGNOSIS — M461 Sacroiliitis, not elsewhere classified: Secondary | ICD-10-CM | POA: Insufficient documentation

## 2023-04-28 DIAGNOSIS — Z79899 Other long term (current) drug therapy: Secondary | ICD-10-CM | POA: Diagnosis not present

## 2023-04-28 DIAGNOSIS — Z5181 Encounter for therapeutic drug level monitoring: Secondary | ICD-10-CM

## 2023-04-28 DIAGNOSIS — M5412 Radiculopathy, cervical region: Secondary | ICD-10-CM | POA: Diagnosis not present

## 2023-04-28 DIAGNOSIS — Z981 Arthrodesis status: Secondary | ICD-10-CM | POA: Insufficient documentation

## 2023-04-28 DIAGNOSIS — M25532 Pain in left wrist: Secondary | ICD-10-CM | POA: Diagnosis not present

## 2023-04-28 DIAGNOSIS — G894 Chronic pain syndrome: Secondary | ICD-10-CM | POA: Diagnosis not present

## 2023-04-28 DIAGNOSIS — M7981 Nontraumatic hematoma of soft tissue: Secondary | ICD-10-CM | POA: Insufficient documentation

## 2023-04-28 MED ORDER — HYDROCODONE-ACETAMINOPHEN 10-325 MG PO TABS: 1.0000 | ORAL_TABLET | Freq: Four times a day (QID) | ORAL | 0 refills | Status: DC | PRN

## 2023-04-28 NOTE — Progress Notes (Signed)
Subjective:    Patient ID: [name], female    DOB: [birth year], 73 y.o.   MRN: [id number]  HPI: [name] is a 73 y.o. female who returns for follow up appointment for chronic pain and medication refill. She states her  pain is located in  her right shoulder S/P fall on 04/23/2023, she has appointment with Ortho today. She also reports  neck pain radiating into her left shoulders and lower back pain. She . rates her pain 8. Her current exercise regime is walking .   [name] reports she was walking to the dumpster at her apartment complex and tripped over the curb, pedestrians helped her up. She was evaluated at [facility] ED, note was reviewed. She was educated on falls prevention, she verbalizes understanding.   Right Shoulder X-ray:  IMPRESSION: 1. Fractures of the right humeral head with mild displacement of the greater tuberosity fracture fragment. 2. Nondisplaced fracture across the base of the glenoid. 3. Underlying degenerative changes.  DG: Right Knee IMPRESSION: Mild degenerative changes in the medial compartment of the right knee. No acute displaced fractures identified.  DG Left Knee:  IMPRESSION: Moderate degenerative joint disease medially. No acute abnormality seen.  DG: Left Hand: IMPRESSION: 1. No acute fracture. 2. Mild-to-moderate osteoarthritis as above.  [name] is 40.00 MME. She is also prescribed Clonazepam  by Dr.Jordan .We have discussed the black box warning of using opioids and benzodiazepines. I highlighted the dangers of using these drugs together and discussed the adverse events including respiratory suppression, overdose, cognitive impairment and importance of compliance with current regimen. We will continue to monitor and adjust as indicated.      Last Oral Swab was Performed on 02/23/2024, it was consistent.     Pain Inventory Average Pain 7 Pain Right Now 8 My pain is sharp, burning, dull,  stabbing, tingling, and aching  In the last 24 hours, has pain interfered with the following? General activity 10 Relation with others 3 Enjoyment of life 8 What TIME of day is your pain at its worst? varies Sleep (in general) Poor  Pain is worse with: unsure Pain improves with: rest and medication Relief from Meds: 9  Family History  Problem Relation Age of Onset   Diabetes Mother    Alzheimer's disease Mother    Lung cancer Father    Esophageal cancer Brother    Colon cancer Neg Hx    Social History   Socioeconomic History   Marital status: Widowed    Spouse name: Not on file   Number of children: 1   Years of education: Not on file   Highest education level: Some college, no degree  Occupational History   Occupation: retired    Comment: work part time  Tobacco Use   Smoking status: Never   Smokeless tobacco: Never   Tobacco comments:    Never smoke 11/01/21  Vaping Use   Vaping status: Never Used  Substance and Sexual Activity   Alcohol use: Never   Drug use: No   Sexual activity: Not on file  Other Topics Concern   Not on file  Social History Narrative   Not on file   Social Drivers of Health   Financial Resource Strain: Medium Risk (04/13/2023)   Overall Financial Resource Strain (CARDIA)    Difficulty of Paying Living Expenses: Somewhat hard  Food Insecurity: Patient Declined (04/13/2023)   Hunger Vital Sign    Worried About Radiation protection practitioner of Food  in the Last Year: Patient declined    Ran Out of Food in the Last Year: Patient declined  Transportation Needs: No Transportation Needs (04/13/2023)   PRAPARE - Administrator, Civil Service (Medical): No    Lack of Transportation (Non-Medical): No  Physical Activity: Insufficiently Active (04/13/2023)   Exercise Vital Sign    Days of Exercise per Week: 3 days    Minutes of Exercise per Session: 10 min  Stress: Stress Concern Present (04/13/2023)   [employer] of Occupational Health - Occupational  Stress Questionnaire    Feeling of Stress : Rather much  Social Connections: Moderately Isolated (04/13/2023)   Social Connection and Isolation Panel [NHANES]    Frequency of Communication with Friends and Family: Twice a week    Frequency of Social Gatherings with Friends and Family: Once a week    Attends Religious Services: More than 4 times per year    Active Member of [employer] or Organizations: No    Attends Banker Meetings: Not on file    Marital Status: Widowed   Past Surgical History:  Procedure Laterality Date   ABDOMINAL HYSTERECTOMY     partial   ACHILLES TENDON SURGERY Left    APPENDECTOMY     BACK SURGERY     BLADDER SUSPENSION     BREAST SURGERY Right 2003   milk duct removed   CARDIAC CATHETERIZATION     CHOLECYSTECTOMY     CORONARY ARTERY BYPASS GRAFT N/A 11/14/2021   Procedure: CORONARY ARTERY BYPASS GRAFTING (CABG) TIMES THREE USING LIMA AND CRYO VEIN.;  Surgeon: Lovett Sox, MD;  Location: [facility] OR;  Service: Open Heart Surgery;  Laterality: N/A;   FRACTURE SURGERY     collar bone   LEFT HEART CATH AND CORONARY ANGIOGRAPHY N/A 03/08/2021   Procedure: LEFT HEART CATH AND CORONARY ANGIOGRAPHY;  Surgeon: Swazil[place], Peter M, MD;  Location: [facility] INVASIVE CV LAB;  Service: Cardiovascular;  Laterality: N/A;   NOSE SURGERY     x 2    OTHER SURGICAL HISTORY     Breast Duct removed   PARTIAL HYSTERECTOMY     RECTOCELE REPAIR     TEE WITHOUT CARDIOVERSION N/A 11/14/2021   Procedure: TRANSESOPHAGEAL ECHOCARDIOGRAM (TEE);  Surgeon: Lovett Sox, MD;  Location: [facility] OR;  Service: Open Heart Surgery;  Laterality: N/A;   TONSILLECTOMY     VASCULAR SURGERY Right    right leg   VEIN SURGERY Right    leg   Past Surgical History:  Procedure Laterality Date   ABDOMINAL HYSTERECTOMY     partial   ACHILLES TENDON SURGERY Left    APPENDECTOMY     BACK SURGERY     BLADDER SUSPENSION     BREAST SURGERY Right 2003   milk duct removed   CARDIAC CATHETERIZATION      CHOLECYSTECTOMY     CORONARY ARTERY BYPASS GRAFT N/A 11/14/2021   Procedure: CORONARY ARTERY BYPASS GRAFTING (CABG) TIMES THREE USING LIMA AND CRYO VEIN.;  Surgeon: Lovett Sox, MD;  Location: [facility] OR;  Service: Open Heart Surgery;  Laterality: N/A;   FRACTURE SURGERY     collar bone   LEFT HEART CATH AND CORONARY ANGIOGRAPHY N/A 03/08/2021   Procedure: LEFT HEART CATH AND CORONARY ANGIOGRAPHY;  Surgeon: Swaziland, Peter M, MD;  Location: [facility] INVASIVE CV LAB;  Service: Cardiovascular;  Laterality: N/A;   NOSE SURGERY     x 2    OTHER SURGICAL HISTORY     Breast Duct removed  PARTIAL HYSTERECTOMY     RECTOCELE REPAIR     TEE WITHOUT CARDIOVERSION N/A 11/14/2021   Procedure: TRANSESOPHAGEAL ECHOCARDIOGRAM (TEE);  Surgeon: Lovett Sox, MD;  Location: [facility] OR;  Service: Open Heart Surgery;  Laterality: N/A;   TONSILLECTOMY     VASCULAR SURGERY Right    right leg   VEIN SURGERY Right    leg   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HCC)    Anxiety    Arthritis    Diabetes mellitus without complication (HCC)    GERD (gastroesophageal reflux disease)    Heart murmur    Hyperlipidemia    Hypertension    Neuromuscular disorder (HCC)    Palpitations    Pneumonia    PONV (postoperative nausea and vomiting)    PTSD (post-traumatic stress disorder)    Ringing of ears, left    There were no vitals taken for this visit.  Opioid Risk Score:   Fall Risk Score:  `1  Depression screen [facility] 2/9     01/12/2023    4:26 PM 12/25/2022    3:32 PM 12/01/2022    3:37 PM 08/28/2022    2:09 PM 05/13/2022    2:07 PM 05/06/2022    2:23 PM 01/18/2022   12:53 PM  Depression screen PHQ 2/9  Decreased Interest 0 0 0 0 0 0 0  Down, Depressed, Hopeless 0 0 2 0 0 0 0  PHQ - 2 Score 0 0 2 0 0 0 0  Altered sleeping 0  2  0  1  Tired, decreased energy 0  2  0  1  Change in appetite 0  2  0  0  Feeling bad or failure about yourself  0  0  0  1  Trouble concentrating 0  1  0  0  Moving  slowly or fidgety/restless 0  0  0  0  Suicidal thoughts 0  0  0  0  PHQ-9 Score 0  9  0  3  Difficult doing work/chores Not difficult at all  Somewhat difficult  Not difficult at all  Somewhat difficult     Review of Systems  Musculoskeletal:  Positive for gait problem.       Right shoulder pain down to elbow Left knee pain  Left palm pain   All other systems reviewed and are negative.     Objective:   Physical Exam Vitals and nursing note reviewed.  Constitutional:      Appearance: Normal appearance.  Neck:     Comments: Cervical Paraspinal Tenderness: C-5-C-6 Cardiovascular:     Rate and Rhythm: Normal rate and regular rhythm.     Pulses: Normal pulses.     Heart sounds: Normal heart sounds.  Pulmonary:     Effort: Pulmonary effort is normal.     Breath sounds: Normal breath sounds.  Musculoskeletal:     Comments: Normal Muscle Bulk and Muscle Testing Reveals:  Upper Extremities:Right: Wearing Sling: Decreased ROM and Muscle Strength 5/5 Left Upper extremity: Full ROM and Muscle Strength  4/5 Thoracic Paraspinal Tenderness: T-1-T-3 Lumbar Paraspinal Tenderness: L-4-L-5 Lower Extremities: Full ROM and Muscle Strength 5/5 Arises from Chair slowly Narrow Based  Gait     Skin:    General: Skin is warm and dry.  Neurological:     Mental Status: She is alert and oriented to person, place, and time.  Psychiatric:        Mood and Affect: Mood normal.  Behavior: Behavior normal.         Assessment & Plan:  1.Chronic cervicalgia with documented spondylosis on MRI, per Dr. Riley Kill Note and Cervical Radiculitis: Continue Gabapentin,  Continue HEP as Tolerated. 04/28/2023 2. Left shoulder pain most consistent with left rotator cuff syndrome. Mild DJD.  Continue HEP as tolerated.Continue to monitor. 01/21/20245. Refilled:  Hydrocodone 10/325 mg one tablet every 6 hours as needed for pain. #120. Second script sent for the following month. We will continue the opioid  monitoring program, this consists of regular clinic visits, examinations, urine drug screen, pill counts as well as use of [place] Controlled Substance Reporting system. A 12 month History has been reviewed on the [place] Controlled Substance Reporting System on 04/28/2023. 3. Left wrist/finger pain most consistent with OA, ?post traumatic. 04/28/2023 4. Anxiety: Continue Klonopin, PCP prescribing. Continue to monitor. 04/28/2023 5. Restless Leg syndrome: Continue Gabapentin. 04/28/2023 7. Sacroiliac inflammation: S/P Right  SI injection on 09/20/2019, with No relief noted. [name] seen Dr August Saucer in regards to her pain. Dr Wynetta Emery. Following.  Continue to Monitor. 04/28/2023 8. Myofascial Pain: Continue Robaxin as needed. 04/28/2023.  9. Bilateral feet with neuropathic pain: Increase Gabapentin 600 mg 4 times a day. Continue to monitor. 04/28/2023. 10.  Herniated Nucleus Pulposus L2-3 with recurrent disc rupture: S/P  Posterior Lumbar Interbody Fusion  - Lumbar two-Lumbar three on 01/13/2020 by Dr Wynetta Emery.  Dr Wynetta Emery Following. 04/28/2023 11. Left Ankle Pain:No complaints today.  Ortho Following. . Continue to monitor. 04/28/2023  12. Polyarthralgia: Continue HEP as Tolerated. Continue to monitor. 04/28/2023 13. Diabetic Peripheral Neuropathy. Continue Gabapentin. Continue to Monitor. Continue to Monitor.  04/28/2023 14, Fall: subsequent encounter: Educated in [place] Prevention: She verbalizes understanding.  15. Acute Right Shoulder Pain/ S/P: She has a scheduled appointment with Ortho Today.  F/U in 2 months

## 2023-04-29 DIAGNOSIS — S42253A Displaced fracture of greater tuberosity of unspecified humerus, initial encounter for closed fracture: Secondary | ICD-10-CM | POA: Insufficient documentation

## 2023-04-29 NOTE — Progress Notes (Signed)
Office Visit Note   Patient: [name]           Date of Birth: 09-19-1950           MRN: [id number] Visit Date: 04/28/2023              Requested by: [place], Betty G, MD [address] [place],  [place] [address] PCP: [place], Betty G, MD   Assessment & Plan: Visit Diagnoses:  1. Closed displaced fracture of greater tuberosity of right humerus, initial encounter     Plan: GLOBAL.  Plan closed treatment greater tuberosity fracture x-rays reviewed outline treatment discussed.  She does have a forearm contusion.  We discussed removal of the shoulder immobilizer if she takes a shower keeping her arm across the chest.  Return 3 weeks single to be x-ray proximal humerus right on return.  Follow-Up Instructions: No follow-ups on file.   Orders:  No orders of the defined types were placed in this encounter.  No orders of the defined types were placed in this encounter.     Procedures: No procedures performed   Clinical Data: No additional findings.   Subjective: Chief Complaint  Patient presents with   Right Shoulder - Injury    Injury   73 year old female with type 2 diabetes  Chronic back pain on hydrocodone 10/325 chronically, previous CABG x 3 history of PVCs fell 04/23/2023 with acute shoulder pain as well as left forearm contusion.  Patient is on Plavix.  X-rays obtained and patient was placed in a shoulder immobilizer for a minimally displaced greater tuberosity fracture.  Patient works at home and it is and is continued and it some even with her shoulder immobilizer on.  Review of Systems all systems updated noncontributory to HPI.   Objective: Vital Signs: BP (!) 140/77 (BP Location: Left Arm, Patient Position: Sitting)   Pulse 79   Physical Exam Constitutional:      Appearance: She is well-developed.  HENT:     Head: Normocephalic.     Right Ear: External ear normal.     Left Ear: External ear normal. There is no impacted cerumen.  Eyes:      Pupils: Pupils are equal, round, and reactive to light.  Neck:     Thyroid: No thyromegaly.     Trachea: No tracheal deviation.  Cardiovascular:     Rate and Rhythm: Normal rate.  Pulmonary:     Effort: Pulmonary effort is normal.  Abdominal:     Palpations: Abdomen is soft.  Musculoskeletal:     Cervical back: No rigidity.  Skin:    General: Skin is warm and dry.  Neurological:     Mental Status: She is alert and oriented to person, place, and time.  Psychiatric:        Behavior: Behavior normal.     Ortho Exam left forearm contusion ecchymosis mid ulna.  Good supination pronation.  Pain with right shoulder range of motion.  Shoulder immobilizer application is corrected.  Station the fingertip is intact.  Specialty Comments:  No specialty comments available.  Imaging: Narrative & Impression  CLINICAL DATA:  Pain after slip and fall on ice.   EXAM: RIGHT SHOULDER - 2+ VIEW   COMPARISON:  None Available.   FINDINGS: Comminuted fractures of the right radial head across the base of the greater tuberosity with mild displacement. Minimally displaced fracture of the inferior rim of the glenoid. No dislocation of the shoulder. Degenerative changes in the glenohumeral and acromioclavicular  joints. Soft tissues are unremarkable.   IMPRESSION: 1. Fractures of the right humeral head with mild displacement of the greater tuberosity fracture fragment. 2. Nondisplaced fracture across the base of the glenoid. 3. Underlying degenerative changes.     Electronically Signed   By: Burman Nieves M.D.   On: 04/23/2023 12:19     PMFS History: Patient Active Problem List   Diagnosis Date Noted   Closed fracture of humerus, greater tuberosity 04/29/2023   PVC (premature ventricular contraction) 11/05/2022   Hyperthyroidism 05/13/2022   Surgical follow-up care 01/06/2022   Encounter for postoperative wound check 01/06/2022   S/P CABG x 3 11/14/2021   Chest pain at rest  11/06/2021   Myopathy, unspecified 10/18/2021   Tommi Rumps Quervain's disease (radial styloid tenosynovitis) 09/26/2021   NSTEMI (non-ST elevated myocardial infarction) (HCC) 08/22/2021   Nausea vomiting and diarrhea 08/22/2021   Pneumonia 08/22/2021   Chronic systolic CHF (congestive heart failure) (HCC)    Abdominal cramping    History of atherosclerotic heart disease 07/15/2021   Coronary artery disease involving native coronary artery of native heart without angina pectoris 04/19/2021   Abnormal nuclear cardiac imaging test 03/08/2021   Diabetic peripheral neuropathy (HCC) 12/06/2020   Anxiety disorder 01/27/2020   HNP (herniated nucleus pulposus), lumbar 01/13/2020   PTSD (post-traumatic stress disorder)    Polyneuropathy associated with underlying disease (HCC) 07/26/2019   Allergic rhinitis 07/26/2019   Atherosclerosis of aorta (HCC) 04/04/2019   Myalgia due to statin 11/18/2017   Depression, major, recurrent, in partial remission (HCC) 10/16/2017   Insomnia 10/16/2017   Chronic pain syndrome 09/07/2017   Spinal stenosis of lumbar region 07/08/2017   DOE (dyspnea on exertion) 12/16/2016   Family history of early CAD 12/16/2016   Arthritis of left acromioclavicular joint 08/13/2016   Left shoulder pain 05/12/2016   Cervical spondylosis without myelopathy 05/12/2016   Rotator cuff syndrome, left 05/12/2016   RLS (restless legs syndrome) 05/12/2016   Primary osteoarthritis, left hand 05/12/2016   MELENA 10/18/2008   GASTROPARESIS 05/30/2008   Type 2 diabetes mellitus with neurological complications (HCC) 05/04/2008   HYPERCHOLESTEROLEMIA 05/04/2008   HYPOKALEMIA 05/04/2008   GERD 05/04/2008   Constipation 05/04/2008   HEMATEMESIS 05/04/2008   Essential hypertension 05/04/2008   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HCC)    Anxiety    Arthritis    Diabetes mellitus without complication (HCC)    GERD (gastroesophageal reflux disease)    Heart murmur     Hyperlipidemia    Hypertension    Neuromuscular disorder (HCC)    Palpitations    Pneumonia    PONV (postoperative nausea and vomiting)    PTSD (post-traumatic stress disorder)    Ringing of ears, left     Family History  Problem Relation Age of Onset   Diabetes Mother    Alzheimer's disease Mother    Lung cancer Father    Esophageal cancer Brother    Colon cancer Neg Hx     Past Surgical History:  Procedure Laterality Date   ABDOMINAL HYSTERECTOMY     partial   ACHILLES TENDON SURGERY Left    APPENDECTOMY     BACK SURGERY     BLADDER SUSPENSION     BREAST SURGERY Right 2003   milk duct removed   CARDIAC CATHETERIZATION     CHOLECYSTECTOMY     CORONARY ARTERY BYPASS GRAFT N/A 11/14/2021   Procedure: CORONARY ARTERY BYPASS GRAFTING (CABG) TIMES THREE USING LIMA AND CRYO  VEIN.;  Surgeon: Lovett Sox, MD;  Location: [facility] OR;  Service: Open Heart Surgery;  Laterality: N/A;   FRACTURE SURGERY     collar bone   LEFT HEART CATH AND CORONARY ANGIOGRAPHY N/A 03/08/2021   Procedure: LEFT HEART CATH AND CORONARY ANGIOGRAPHY;  Surgeon: [place], Peter M, MD;  Location: [facility] INVASIVE CV LAB;  Service: Cardiovascular;  Laterality: N/A;   NOSE SURGERY     x 2    OTHER SURGICAL HISTORY     Breast Duct removed   PARTIAL HYSTERECTOMY     RECTOCELE REPAIR     TEE WITHOUT CARDIOVERSION N/A 11/14/2021   Procedure: TRANSESOPHAGEAL ECHOCARDIOGRAM (TEE);  Surgeon: Lovett Sox, MD;  Location: [facility] OR;  Service: Open Heart Surgery;  Laterality: N/A;   TONSILLECTOMY     VASCULAR SURGERY Right    right leg   VEIN SURGERY Right    leg   Social History   Occupational History   Occupation: retired    Comment: work part time  Tobacco Use   Smoking status: Never   Smokeless tobacco: Never   Tobacco comments:    Never smoke 11/01/21  Vaping Use   Vaping status: Never Used  Substance and Sexual Activity   Alcohol use: Never   Drug use: No   Sexual activity: Not on file

## 2023-05-20 ENCOUNTER — Ambulatory Visit: Payer: Medicare Other | Admitting: Orthopaedic Surgery

## 2023-05-27 ENCOUNTER — Encounter: Payer: Self-pay | Admitting: Orthopaedic Surgery

## 2023-05-27 ENCOUNTER — Other Ambulatory Visit (INDEPENDENT_AMBULATORY_CARE_PROVIDER_SITE_OTHER): Payer: Self-pay

## 2023-05-27 ENCOUNTER — Ambulatory Visit: Payer: Medicare Other | Admitting: Orthopaedic Surgery

## 2023-05-27 VITALS — Ht 68.0 in | Wt 174.0 lb

## 2023-05-27 DIAGNOSIS — S42251D Displaced fracture of greater tuberosity of right humerus, subsequent encounter for fracture with routine healing: Secondary | ICD-10-CM | POA: Diagnosis not present

## 2023-05-27 DIAGNOSIS — M65332 Trigger finger, left middle finger: Secondary | ICD-10-CM | POA: Insufficient documentation

## 2023-05-27 NOTE — Progress Notes (Addendum)
 Post-Op Visit Note   Patient: [name]           Date of Birth: [birth year]           MRN: [id number] Visit Date: 05/27/2023 PCP: [place], Betty G, MD   Assessment & Plan: Global follow-up proximal humerus fracture greater tuberosity without dislocation.  X-rays today demonstrate some callus formation without any change in position of the greater tuberosity fracture.  Will start some physical therapy for gentle passive range of motion and active assistive range of motion but no resistive range of motion for an additional 4 weeks.  Recheck 3 weeks. Patient is continue to do some work doing alterations is having triggering with her left long finger.  Tenderness over the A1 pulley and trigger finger injection was performed today with good relief of her symptoms.  We discussed pathophysiology. Chief Complaint:  Chief Complaint  Patient presents with   Right Shoulder - Fracture, Follow-up    Fall 04/23/2023   Visit Diagnoses:  1. Closed displaced fracture of greater tuberosity of right humerus with routine healing, subsequent encounter   2. Trigger finger, left middle finger     Plan: Start physical therapy recheck 3 weeks.  Follow-Up Instructions: Return in about 3 weeks (around 06/17/2023).   Orders:  Orders Placed This Encounter  Procedures   XR Shoulder 1V Right   Ambulatory referral to Physical Therapy   No orders of the defined types were placed in this encounter.   Imaging: XR Shoulder 1V Right Result Date: 05/27/2023 2 view x-rays right proximal humerus obtained and reviewed this shows again greater tuberosity fracture with some sclerotic interval healing.  No change in the position with 1 mm displacement of the greater tuberosity. Impression: Greater tuberosity fracture right proximal humerus.  Position is maintained with some early interval healing.   PMFS History: Patient Active Problem List   Diagnosis Date Noted   Trigger finger, left middle finger  05/27/2023   Closed fracture of humerus, greater tuberosity 04/29/2023   PVC (premature ventricular contraction) 11/05/2022   Hyperthyroidism 05/13/2022   Surgical follow-up care 01/06/2022   Encounter for postoperative wound check 01/06/2022   S/P CABG x 3 11/14/2021   Chest pain at rest 11/06/2021   Myopathy, unspecified 10/18/2021   Tommi Rumps Quervain's disease (radial styloid tenosynovitis) 09/26/2021   NSTEMI (non-ST elevated myocardial infarction) (HCC) 08/22/2021   Nausea vomiting and diarrhea 08/22/2021   Pneumonia 08/22/2021   Chronic systolic CHF (congestive heart failure) (HCC)    Abdominal cramping    History of atherosclerotic heart disease 07/15/2021   Coronary artery disease involving native coronary artery of native heart without angina pectoris 04/19/2021   Abnormal nuclear cardiac imaging test 03/08/2021   Diabetic peripheral neuropathy (HCC) 12/06/2020   Anxiety disorder 01/27/2020   HNP (herniated nucleus pulposus), lumbar 01/13/2020   PTSD (post-traumatic stress disorder)    Polyneuropathy associated with underlying disease (HCC) 07/26/2019   Allergic rhinitis 07/26/2019   Atherosclerosis of aorta (HCC) 04/04/2019   Myalgia due to statin 11/18/2017   Depression, major, recurrent, in partial remission (HCC) 10/16/2017   Insomnia 10/16/2017   Chronic pain syndrome 09/07/2017   Spinal stenosis of lumbar region 07/08/2017   DOE (dyspnea on exertion) 12/16/2016   Family history of early CAD 12/16/2016   Arthritis of left acromioclavicular joint 08/13/2016   Left shoulder pain 05/12/2016   Cervical spondylosis without myelopathy 05/12/2016   Rotator cuff syndrome, left 05/12/2016   RLS (restless legs syndrome) 05/12/2016  Primary osteoarthritis, left hand 05/12/2016   MELENA 10/18/2008   GASTROPARESIS 05/30/2008   Type 2 diabetes mellitus with neurological complications (HCC) 05/04/2008   HYPERCHOLESTEROLEMIA 05/04/2008   HYPOKALEMIA 05/04/2008   GERD 05/04/2008    Constipation 05/04/2008   HEMATEMESIS 05/04/2008   Essential hypertension 05/04/2008   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HCC)    Anxiety    Arthritis    Diabetes mellitus without complication (HCC)    GERD (gastroesophageal reflux disease)    Heart murmur    Hyperlipidemia    Hypertension    Neuromuscular disorder (HCC)    Palpitations    Pneumonia    PONV (postoperative nausea and vomiting)    PTSD (post-traumatic stress disorder)    Ringing of ears, left     Family History  Problem Relation Age of Onset   Diabetes Mother    Alzheimer's disease Mother    Lung cancer Father    Esophageal cancer Brother    Colon cancer Neg Hx     Past Surgical History:  Procedure Laterality Date   ABDOMINAL HYSTERECTOMY     partial   ACHILLES TENDON SURGERY Left    APPENDECTOMY     BACK SURGERY     BLADDER SUSPENSION     BREAST SURGERY Right 2003   milk duct removed   CARDIAC CATHETERIZATION     CHOLECYSTECTOMY     CORONARY ARTERY BYPASS GRAFT N/A 11/14/2021   Procedure: CORONARY ARTERY BYPASS GRAFTING (CABG) TIMES THREE USING LIMA AND CRYO VEIN.;  Surgeon: Lovett Sox, MD;  Location: [facility] OR;  Service: Open Heart Surgery;  Laterality: N/A;   FRACTURE SURGERY     collar bone   LEFT HEART CATH AND CORONARY ANGIOGRAPHY N/A 03/08/2021   Procedure: LEFT HEART CATH AND CORONARY ANGIOGRAPHY;  Surgeon: [place], Peter M, MD;  Location: [facility] INVASIVE CV LAB;  Service: Cardiovascular;  Laterality: N/A;   NOSE SURGERY     x 2    OTHER SURGICAL HISTORY     Breast Duct removed   PARTIAL HYSTERECTOMY     RECTOCELE REPAIR     TEE WITHOUT CARDIOVERSION N/A 11/14/2021   Procedure: TRANSESOPHAGEAL ECHOCARDIOGRAM (TEE);  Surgeon: Lovett Sox, MD;  Location: [facility] OR;  Service: Open Heart Surgery;  Laterality: N/A;   TONSILLECTOMY     VASCULAR SURGERY Right    right leg   VEIN SURGERY Right    leg   Social History   Occupational History   Occupation: retired     Comment: work part time  Tobacco Use   Smoking status: Never   Smokeless tobacco: Never   Tobacco comments:    Never smoke 11/01/21  Vaping Use   Vaping status: Never Used  Substance and Sexual Activity   Alcohol use: Never   Drug use: No   Sexual activity: Not on file

## 2023-05-28 ENCOUNTER — Ambulatory Visit: Payer: Medicare Other | Admitting: Orthopaedic Surgery

## 2023-06-04 ENCOUNTER — Encounter (HOSPITAL_COMMUNITY): Payer: Self-pay | Admitting: Cardiology

## 2023-06-04 ENCOUNTER — Ambulatory Visit (HOSPITAL_COMMUNITY)
Admission: RE | Admit: 2023-06-04 | Discharge: 2023-06-04 | Disposition: A | Discharge status: Home / Self Care | Payer: Medicare Other | Source: Ambulatory Visit | Attending: Cardiology | Admitting: Cardiology

## 2023-06-04 ENCOUNTER — Ambulatory Visit (HOSPITAL_BASED_OUTPATIENT_CLINIC_OR_DEPARTMENT_OTHER)
Admission: RE | Admit: 2023-06-04 | Discharge: 2023-06-04 | Disposition: A | Discharge status: Home / Self Care | Payer: Medicare Other | Source: Ambulatory Visit | Attending: Cardiology | Admitting: Cardiology

## 2023-06-04 VITALS — BP 140/80 | HR 85 | Wt 166.0 lb

## 2023-06-04 DIAGNOSIS — Z7984 Long term (current) use of oral hypoglycemic drugs: Secondary | ICD-10-CM | POA: Diagnosis not present

## 2023-06-04 DIAGNOSIS — I493 Ventricular premature depolarization: Secondary | ICD-10-CM | POA: Insufficient documentation

## 2023-06-04 DIAGNOSIS — E039 Hypothyroidism, unspecified: Secondary | ICD-10-CM | POA: Insufficient documentation

## 2023-06-04 DIAGNOSIS — F411 Generalized anxiety disorder: Secondary | ICD-10-CM | POA: Insufficient documentation

## 2023-06-04 DIAGNOSIS — I110 Hypertensive heart disease with heart failure: Secondary | ICD-10-CM | POA: Diagnosis not present

## 2023-06-04 DIAGNOSIS — E041 Nontoxic single thyroid nodule: Secondary | ICD-10-CM | POA: Diagnosis not present

## 2023-06-04 DIAGNOSIS — Z7902 Long term (current) use of antithrombotics/antiplatelets: Secondary | ICD-10-CM | POA: Diagnosis not present

## 2023-06-04 DIAGNOSIS — L905 Scar conditions and fibrosis of skin: Secondary | ICD-10-CM | POA: Diagnosis not present

## 2023-06-04 DIAGNOSIS — I4891 Unspecified atrial fibrillation: Secondary | ICD-10-CM | POA: Insufficient documentation

## 2023-06-04 DIAGNOSIS — Z794 Long term (current) use of insulin: Secondary | ICD-10-CM | POA: Diagnosis not present

## 2023-06-04 DIAGNOSIS — I255 Ischemic cardiomyopathy: Secondary | ICD-10-CM | POA: Insufficient documentation

## 2023-06-04 DIAGNOSIS — Z8679 Personal history of other diseases of the circulatory system: Secondary | ICD-10-CM | POA: Diagnosis not present

## 2023-06-04 DIAGNOSIS — Z79899 Other long term (current) drug therapy: Secondary | ICD-10-CM | POA: Insufficient documentation

## 2023-06-04 DIAGNOSIS — I5022 Chronic systolic (congestive) heart failure: Secondary | ICD-10-CM

## 2023-06-04 DIAGNOSIS — I2510 Atherosclerotic heart disease of native coronary artery without angina pectoris: Secondary | ICD-10-CM | POA: Diagnosis not present

## 2023-06-04 DIAGNOSIS — E785 Hyperlipidemia, unspecified: Secondary | ICD-10-CM | POA: Diagnosis not present

## 2023-06-04 DIAGNOSIS — I700 Atherosclerosis of aorta: Secondary | ICD-10-CM

## 2023-06-04 DIAGNOSIS — E119 Type 2 diabetes mellitus without complications: Secondary | ICD-10-CM | POA: Diagnosis not present

## 2023-06-04 DIAGNOSIS — Z951 Presence of aortocoronary bypass graft: Secondary | ICD-10-CM | POA: Insufficient documentation

## 2023-06-04 DIAGNOSIS — I214 Non-ST elevation (NSTEMI) myocardial infarction: Secondary | ICD-10-CM | POA: Diagnosis not present

## 2023-06-04 LAB — ECHOCARDIOGRAM COMPLETE
AR max vel: 1.46 cm2
AV Area VTI: 2.17 cm2
AV Area mean vel: 1.43 cm2
AV Mean grad: 3 mmHg
AV Peak grad: 6.4 mmHg
Ao pk vel: 1.26 m/s
Area-P 1/2: 2.48 cm2
Calc EF: 43.5 %
MV VTI: 3.66 cm2
S' Lateral: 3.2 cm
Single Plane A2C EF: 44.5 %
Single Plane A4C EF: 47.1 %

## 2023-06-04 LAB — BASIC METABOLIC PANEL
Anion gap: 9 (ref 5–15)
BUN: 12 mg/dL (ref 8–23)
CO2: 26 mmol/L (ref 22–32)
Calcium: 9.2 mg/dL (ref 8.9–10.3)
Chloride: 106 mmol/L (ref 98–111)
Creatinine, Ser: 0.75 mg/dL (ref 0.44–1.00)
GFR, Estimated: >60 mL/min (ref >60)
Glucose, Bld: 85 mg/dL (ref 70–99)
Potassium: 4.2 mmol/L (ref 3.5–5.1)
Sodium: 141 mmol/L (ref 135–145)

## 2023-06-04 LAB — BRAIN NATRIURETIC PEPTIDE: B Natriuretic Peptide: 242.1 pg/mL — ABNORMAL HIGH (ref 0.0–100.0)

## 2023-06-04 MED ORDER — SACUBITRIL-VALSARTAN 24-26 MG PO TABS: 1.0000 | ORAL_TABLET | Freq: Two times a day (BID) | ORAL | 11 refills | Status: DC

## 2023-06-04 MED ORDER — METOPROLOL SUCCINATE ER 25 MG PO TB24: 75.0000 mg | ORAL_TABLET | Freq: Two times a day (BID) | ORAL | 3 refills | Status: DC

## 2023-06-04 MED ORDER — FUROSEMIDE 20 MG PO TABS: 20.0000 mg | ORAL_TABLET | Freq: Every day | ORAL | 6 refills | Status: DC | PRN

## 2023-06-04 NOTE — Progress Notes (Signed)
 ADVANCED HF CLINIC NOTE  Primary Care: [place], Betty G, MD HF Cardiologist: Dr. Shirlee Latch  Chief complaint: CHF  HPI: [name] is a 73 y.o. female w/ chronic systolic heart failure, CAD, PVCs, type 2DM, HTN and HLD.    Referred for evaluation of palpitations 6/22. Cardiac monitor showed 24% PVC burden. Echo EF 55-60%, RV normal.    11/22 she was ordered to get stress test prior to surgical clearance for back surgery. This showed medium to large defects in the apical anterior/anterior septal distribution as well as basal inferior lateral distribution which was suggestive of scar and mild peri-infarct ischemia. She was subsequently referred for [facility] 12/22 which showed severe obstructive CAD with subtotal 99% mid-LAD and complex LCx disease with severe stenoses involving the bifurication of 4 branches with CT surgery consult recommended as LCx disease was not amenable to PCI. Repeat echo showed her EF was mildly reduced at 45-50% with mild LVH, normal RV function and trivial MR. She did meet with Dr. Cliffton Asters on 03/22/2021 and by review of notes was very hesitant about proceeding with surgical intervention and was informed to make them aware with her ultimate decision about proceeding with CABG.    She went for a 2nd opinion and saw Dr. Donata Clay 4/23 who also recommended CABG, but pt remained hesitant.    Presented to ED 08/22/21 w/ CC, nausea/vomiting and diarrhea. Notes outlined that she had CP and dyspnea but pt denies this. She was found to have an AKI and diagnosed w/ colitis. Also treated for CAP. HsTn 86>>80. Level not consistent with ACS. Felt most likely demand ischemia in the setting of dehydration (vomiting/diarrhea), AKI, acute infection. Echo repeated EF 40-45% w septal apical and inferior apical hypokinesis.    She finally had CABG in 8/23 with LIMA-LAD, SVG-OM1, SVG-OM2.  She had transient post-op atrial fibrillation.   Post-op echo in 11/23 showed EF 45-50% with normal RV.  She  was started on spironolactone but she could not tolerate this due to diarrhea that started on spironolactone and resolved once she had stopped it.   She had urgent work-in from [facility] 03/17/22 with HR in 40's. She felt palpitations, in our office, HR 89 on ECG with 2 PVCs. Toprol increased with frequent PVCs (and not bradycardia). Zio 7 day placed to quantify and showed PVCs down to 13.5%.   Repeat Zio monitor in 7/24 showed 18.2% PVCs.  I referred her to EP for evaluation.  Mexiletine was offered but she preferred not to start it.   Echo was done today and reviewed, EF 45-50%, RV mildly decreased systolic function, IVC normal. Technically difficult study.   Today she returns for HF follow up. She seems to be doing better symptomatically.  She broke her right humerus recently after tripping on ice.  She rarely feels palpitations.  She denies exertional dyspnea or chest pain. She has been off Repatha, was worried it would cause joint pains.  She never started [place].  She also reports daytime sleepiness and snore, sometimes wakes up gasping. Weight down 11 lbs.  BP mildly elevated. No orthopnea.   ECG (personally reviewed): NSR with PVCs  Labs (7/23): K 4.4, creatinine 1.28 Labs (8/23): K 3.5, creatinine 0.99 Labs (10/23): K 3.9, creatinine 0.99 Labs (12/23): K 3.9, creatinine 0.98 Labs (2/24): TSH low but free T4 normal Labs (6/24): LDL 72 Labs (1/25): K 3.4, creatinine 0.99, TSH normal, free T4 normal   PMH: 1.  Chronic systolic CHF: Ischemic cardiomyopathy.  -  Echo (12/22): EF 45-50%, RV normal - Echo (5/23): EF 40-45%, RV normal  - Echo (11/23): EF 45-50%, normal RV - Echo (2/25): EF 45-50%, RV mildly decreased systolic function, IVC normal. Technically difficult study.  2. CAD: LHC (10/22) with subtotal mid LAD stenosis, complete LCx disease with severe stenoses involving bifurcation of 4 branches of the LCx.  - CABG (8/23) with LIMA-LAD, SVG-OM1, SVG-OM2 3. Atrial  fibrillation: Transient post-CABG.   4. Hyperlipidemia: Unable to take statins.  5. HTN 6. Type 2 diabetes 7. PVCs: Zio monitor (8/23) with 23% PVCs - Zio 7 day (12/23): 13.5 % PVCs - Zio 7 day (7/24): 18.2% PVCs 8. Generalized anxiety  Review of Systems: All systems reviewed and negative except as per HPI.   Current Outpatient Medications  Medication Sig Dispense Refill   albuterol (VENTOLIN HFA) 108 (90 Base) MCG/ACT inhaler Inhale 2 puffs into the lungs every 6 (six) hours as needed for wheezing or shortness of breath. 18 g 2   Alcohol Swabs (B-D SINGLE USE SWABS REGULAR) PADS Use to test blood sugar 3 times daily 100 each 3   Blood Glucose Monitoring Suppl (ONETOUCH VERIO FLEX SYSTEM) w/Device KIT Use to test blood sugars 1-2 times daily. 1 kit 0   clonazePAM (KLONOPIN) 1 MG tablet Take 1 tablet by mouth twice daily as needed for anxiety 60 tablet 3   clopidogrel (PLAVIX) 75 MG tablet Take 1 tablet (75 mg total) by mouth daily. 90 tablet 3   Dulaglutide (TRULICITY) 1.5 MG/0.5ML SOAJ INJECT 1.5 MG (0.5ML) UNDER THE SKIN ONCE A WEEK 6 mL 2   ezetimibe (ZETIA) 10 MG tablet Take 1 tablet (10 mg total) by mouth daily. 90 tablet 3   fluticasone (FLONASE) 50 MCG/ACT nasal spray Place 2 sprays into both nostrils daily. 16 g 11   gabapentin (NEURONTIN) 600 MG tablet TAKE 1 TABLET BY MOUTH EVERY MORNING AND AT NOON AND EVERY EVENING AND AT BEDTIME 120 tablet 3   guaiFENesin (MUCINEX) 600 MG 12 hr tablet Take 600 mg by mouth daily.     HYDROcodone-acetaminophen (NORCO) 10-325 MG tablet Take 1 tablet by mouth every 6 (six) hours as needed. 120 tablet 0   JARDIANCE 10 MG TABS tablet Take 1 tablet (10 mg total) by mouth daily. 90 tablet 3   Lancets (ONETOUCH ULTRASOFT) lancets Use to test blood sugar 1-2 times daily. 100 each 12   Melatonin 10 MG CHEW Chew 2 tablets by mouth at bedtime.     ondansetron (ZOFRAN-ODT) 4 MG disintegrating tablet Take 1 tablet (4 mg total) by mouth daily as needed for  nausea or vomiting. 20 tablet 2   pantoprazole (PROTONIX) 40 MG tablet Take 1 tablet (40 mg total) by mouth daily. 90 tablet 3   Polyethyl Glycol-Propyl Glycol (SYSTANE) 0.4-0.3 % SOLN Place 1 drop into both eyes daily as needed (Dry eye).     potassium chloride SA (KLOR-CON M20) 20 MEQ tablet Take 1 tablet (20 mEq total) by mouth daily. 30 tablet 5   QUEtiapine (SEROQUEL) 100 MG tablet TAKE 1/2 (ONE-HALF) TABLET BY MOUTH AT BEDTIME 45 tablet 3   rOPINIRole (REQUIP) 0.5 MG tablet Take 1 tablet (0.5 mg total) by mouth at bedtime. 90 tablet 1   sacubitril-valsartan (ENTRESTO) 24-26 MG Take 1 tablet by mouth 2 (two) times daily. 60 tablet 11   triamcinolone (KENALOG) 0.1 % paste Use as directed 1 Application in the mouth or throat daily as needed (mouth pain).     furosemide (LASIX) 20  MG tablet Take 1 tablet (20 mg total) by mouth daily as needed. 30 tablet 6   metoprolol succinate (TOPROL-XL) 25 MG 24 hr tablet Take 3 tablets (75 mg total) by mouth 2 (two) times daily. 180 tablet 3   No current facility-administered medications for this encounter.   Allergies  Allergen Reactions   Hydromorphone Nausea And Vomiting and Other (See Comments)    Made the patient feel "spaced out,"  Other Reaction(s): GI Intolerance, Other (See Comments)  Made the patient feel "spaced out," also   Morphine And Codeine Itching    With high doses   Statins     Hx intolerance to multiple statins   Penicillins Rash    Has patient had a PCN reaction causing immediate rash, facial/tongue/throat swelling, SOB or lightheadedness with hypotension: Yes  Has patient had a PCN reaction causing severe rash involving mucus membranes or skin necrosis: No  Has patient had a PCN reaction that required hospitalization: No  Has patient had a PCN reaction occurring within the last 10 years: No  If all of the above answers are "NO", then may proceed with Cephalosporin use.  Has patient had a PCN reaction causing immediate  rash, facial/tongue/throat swelling, SOB or lightheadedness with hypotension: Yes, Has patient had a PCN reaction causing severe rash involving mucus membranes or skin necrosis: No, Has patient had a PCN reaction that required hospitalization: No, Has patient had a PCN reaction occurring within the last 10 years: No, If all of the above answers are "NO", then may proceed with Cephalosporin use.   Social History   Socioeconomic History   Marital status: Widowed    Spouse name: Not on file   Number of children: 1   Years of education: Not on file   Highest education level: Some college, no degree  Occupational History   Occupation: retired    Comment: work part time  Tobacco Use   Smoking status: Never   Smokeless tobacco: Never   Tobacco comments:    Never smoke 11/01/21  Vaping Use   Vaping status: Never Used  Substance and Sexual Activity   Alcohol use: Never   Drug use: No   Sexual activity: Not on file  Other Topics Concern   Not on file  Social History Narrative   Not on file   Social Drivers of Health   Financial Resource Strain: Medium Risk (04/13/2023)   Overall Financial Resource Strain (CARDIA)    Difficulty of Paying Living Expenses: Somewhat hard  Food Insecurity: Patient Declined (04/13/2023)   Hunger Vital Sign    Worried About Running Out of Food in the Last Year: Patient declined    Ran Out of Food in the Last Year: Patient declined  Transportation Needs: No Transportation Needs (04/13/2023)   PRAPARE - Administrator, Civil Service (Medical): No    Lack of Transportation (Non-Medical): No  Physical Activity: Insufficiently Active (04/13/2023)   Exercise Vital Sign    Days of Exercise per Week: 3 days    Minutes of Exercise per Session: 10 min  Stress: Stress Concern Present (04/13/2023)   [employer] of Occupational Health - Occupational Stress Questionnaire    Feeling of Stress : Rather much  Social Connections: Moderately Isolated (04/13/2023)    Social Connection and Isolation Panel [NHANES]    Frequency of Communication with Friends and Family: Twice a week    Frequency of Social Gatherings with Friends and Family: Once a week    Attends Religious  Services: More than 4 times per year    Active Member of Clubs or Organizations: No    Attends Banker Meetings: Not on file    Marital Status: Widowed  Intimate Partner Violence: Not At Risk (01/12/2023)   Humiliation, Afraid, Rape, and Kick questionnaire    Fear of Current or Ex-Partner: No    Emotionally Abused: No    Physically Abused: No    Sexually Abused: No   Family History  Problem Relation Age of Onset   Diabetes Mother    Alzheimer's disease Mother    Lung cancer Father    Esophageal cancer Brother    Colon cancer Neg Hx    BP (!) 140/80   Pulse 85   Wt 75.3 kg (166 lb)   SpO2 96%   BMI 25.24 kg/m   PHYSICAL EXAM: General: NAD Neck: No JVD, thyroid nodule on right.  Lungs: Clear to auscultation bilaterally with normal respiratory effort. CV: Nondisplaced PMI.  Heart regular S1/S2, no S3/S4, no murmur.  No peripheral edema.  No carotid bruit.  Normal pedal pulses.  Abdomen: Soft, nontender, no hepatosplenomegaly, no distention.  Skin: Intact without lesions or rashes.  Neurologic: Alert and oriented x 3.  Psych: Normal affect. Extremities: No clubbing or cyanosis.  HEENT: Normal.   ASSESSMENT & PLAN: 1.  Chronic Systolic Heart Failure: Ischemic cardiomyopathy.  Echo 12/22 EF 45-50% => LHC with 2 vessel CAD w/ high grade mLAD disease.  Echo 5/23 with EF 40-45%.  She had CABG x 3 in 8/23.  Post-op echo in 11/23 with EF 45-50%, normal RV.  Echo today was reviewed and showed EF 45-50%, RV mildly decreased systolic function, IVC normal. NYHA class I, she is not volume overloaded on exam.  - Increase Toprol XL to 75 mg bid, this may also help with PVCs.   - Continue Jardiance 10 mg daily.  - She did not tolerate spironolactone.  - Start Entresto  24/26 bid with BMET/BNP today and BMET in 10 days.  - Continue Lasix PRN. She likely will not need this once she starts Entresto.  - EF is out of ICD range.  2. CAD: LHC (12/22) with 2V CAD w/ high grade mLAD disease=> pt declined CABG initially but had CABG with LIMA-LAD, SVG-OM1 and SVG-OM2 in 8/23.  No chest pain.  - Continue Plavix long-term.  - Continue ? blocker.  - She is on Zetia but needs to start back on Repatha.  She is willing to restart.  Lipids in 2 months.  3. Frequent PVCs: She has a long history of PVCs that pre-dated her CABG.  23% burden on Zio 8/23.  At last visit, only a couple of PVCs on ECG (no longer bigeminal). PVCs may arise from scar in LV from CAD.  Concerned that very frequent PVCs could worsen her cardiomyopathy.  Repeat Zio 7 day (12/23) showed PVCs frequent but improved from previous, down to 13.5%.  However, 7/24 Zio showed PVCs back up to 18.2%.  She did not want to start mexiletine. Echo today showed stable EF 45-50%, so LV function does not seem to be worsening with the presence of frequent PVCs.  - Increase Toprol XL to 75 mg bid.  4. Hyperlipidemia: Unable to tolerate statins.  On Zetia.   - Restart Repatha, check lipids in 2 months.  5. Atrial fibrillation: Transient post-CABG.  Now off amiodarone. No recurrence has been seen.  - If recurs, will need anticoagulation.  6. ? Hyperthyroidism: Most recently,  TSH and free T4 normal. She has a thyroid nodule.   - PCP managing.  7. Generalized anxiety   Follow up in 3 months with APP.   I spent 32 minutes reviewing records, interviewing/examining patient, and managing orders.   Marca Ancona  06/04/2023

## 2023-06-04 NOTE — Patient Instructions (Addendum)
 START Entresto 24/26 mg Twice daily  INCREASE Toprol XL to 75 mg ( 1 1/2 Tabs) Twice daily  RESTART your Repeatha.  Labs done today, your results will be available in MyChart, we will contact you for abnormal readings.  Repeat blood work in 10 days in [place].  Your provider has recommended that you have a home sleep study (Itamar Test).  We have provided you with the equipment in our office today. Please go ahead and download the app. DO NOT OPEN OR TAMPER WITH THE BOX UNTIL WE ADVISE YOU TO DO SO. Once insurance has approved the test our office will call you with PIN number and approval to proceed with testing. Once you have completed the test you just dispose of the equipment, the information is automatically uploaded to [place] via blue-tooth technology. If your test is positive for sleep apnea and you need a home CPAP machine you will be contacted by Dr Norris Cross office [facility]) to set this up.  Your physician recommends that you schedule a follow-up appointment in: 4 months (June) ** PLEASE CALL THE OFFICE IN [place] TO ARRANGE YOUR FOLLOW UP APPOINTMENT.**  If you have any questions or concerns before your next appointment please send [place] a message through [place] or call our office at [phone].    TO LEAVE A MESSAGE FOR THE NURSE SELECT OPTION 2, PLEASE LEAVE A MESSAGE INCLUDING: YOUR NAME DATE OF BIRTH CALL BACK NUMBER REASON FOR CALL**this is important as we prioritize the call backs  YOU WILL RECEIVE A CALL BACK THE SAME DAY AS LONG AS YOU CALL BEFORE 4:00 PM  At the [facility], you and your health needs are our priority. As part of our continuing mission to provide you with exceptional heart care, we have created designated Provider Care Teams. These Care Teams include your primary Cardiologist (physician) and Advanced Practice Providers (APPs- Physician Assistants and Nurse Practitioners) who all work together to provide you with the care you need, when  you need it.   You may see any of the following providers on your designated Care Team at your next follow up: Dr Arvilla Meres Dr Marca Ancona Dr. Dorthula Nettles Dr. Clearnce Hasten Amy Filbert Schilder, NP Robbie Lis, [place] [facility] [place], [place] Brynda Peon, NP [place] Lee, NP Clarisa Kindred, NP Karle Plumber, PharmD Enos Fling, PharmD   Please be sure to bring in all your medications bottles to every appointment.    Thank you for choosing [facility]

## 2023-06-04 NOTE — Progress Notes (Signed)
 Height:     Weight: BMI:  Today's Date:  STOP BANG RISK ASSESSMENT S (snore) Have you been told that you snore?     NO   T (tired) Are you often tired, fatigued, or sleepy during the day?   YES  O (obstruction) Do you stop breathing, choke, or gasp during sleep? YES   P (pressure) Do you have or are you being treated for high blood pressure? YES   B (BMI) Is your body index greater than 35 kg/m? NO   A (age) Are you 72 years old or older? YES   N (neck) Do you have a neck circumference greater than 16 inches?   NO   G (gender) Are you a female? NO   TOTAL STOP/BANG "YES" ANSWERS 4                                                                       For Office Use Only              Procedure Order Form    YES to 3+ Stop Bang questions OR two clinical symptoms - patient qualifies for WatchPAT (CPT 95800)      Clinical Notes: Will consult Sleep Specialist and refer for management of therapy due to patient increased risk of Sleep Apnea. Ordering a sleep study due to the following two clinical symptoms: Excessive daytime sleepiness G47.10 /  /Morning Headaches G44.221 / Difficulty concentrating R41.840 / Memory problems or poor judgment G31.84 / Personality changes or irritability R45.4 / Loud snoring R06.83 /  Unrefreshed by sleep G47.8 / History of high blood pressure R03.0

## 2023-06-04 NOTE — Progress Notes (Signed)
  Echocardiogram 2D Echocardiogram has been performed.  Ocie Doyne RDCS 06/04/2023, 2:50 PM

## 2023-06-05 ENCOUNTER — Other Ambulatory Visit (HOSPITAL_COMMUNITY): Payer: Self-pay

## 2023-06-05 ENCOUNTER — Encounter: Payer: Self-pay | Admitting: Pharmacy Technician

## 2023-06-05 ENCOUNTER — Telehealth: Payer: Self-pay | Admitting: Pharmacy Technician

## 2023-06-05 ENCOUNTER — Telehealth: Payer: Self-pay | Admitting: Pharmacist

## 2023-06-05 DIAGNOSIS — I214 Non-ST elevation (NSTEMI) myocardial infarction: Secondary | ICD-10-CM

## 2023-06-05 DIAGNOSIS — E785 Hyperlipidemia, unspecified: Secondary | ICD-10-CM

## 2023-06-05 DIAGNOSIS — I700 Atherosclerosis of aorta: Secondary | ICD-10-CM

## 2023-06-05 DIAGNOSIS — I2510 Atherosclerotic heart disease of native coronary artery without angina pectoris: Secondary | ICD-10-CM

## 2023-06-05 MED ORDER — REPATHA SURECLICK 140 MG/ML ~~LOC~~ SOAJ: 1.0000 mL | SUBCUTANEOUS | 1 refills | Status: DC

## 2023-06-05 NOTE — Telephone Encounter (Signed)
-----   Message from Nurse Emer C sent at 06/04/2023  3:59 PM EST ----- Regarding: Repeatha Restarting Repeatha, can you check to see if she can still can get the grant for it   Thank you

## 2023-06-05 NOTE — Telephone Encounter (Signed)
 Patient Advocate Encounter   The patient was approved for a [employer] grant that will help cover the cost of repatha Total amount awarded, 2500.00.  Effective: 05/06/23 - 05/04/24   [id number] [id number] [id number]   Pharmacy provided with approval and processing information. Patient informed via mychart

## 2023-06-05 NOTE — Telephone Encounter (Signed)
 Prior [place] approval on file. Ran test claim for repatha. For a 28 day supply and the co-pay is 45.00 . PA is not needed at this time. This test claim was processed through [facility]- copay amounts may vary at other pharmacies due to pharmacy/plan contracts, or as the patient moves through the different stages of their insurance plan.

## 2023-06-08 DIAGNOSIS — S42254D Nondisplaced fracture of greater tuberosity of right humerus, subsequent encounter for fracture with routine healing: Secondary | ICD-10-CM | POA: Diagnosis not present

## 2023-06-09 DIAGNOSIS — M79671 Pain in right foot: Secondary | ICD-10-CM | POA: Diagnosis not present

## 2023-06-09 DIAGNOSIS — I739 Peripheral vascular disease, unspecified: Secondary | ICD-10-CM | POA: Diagnosis not present

## 2023-06-09 DIAGNOSIS — E1140 Type 2 diabetes mellitus with diabetic neuropathy, unspecified: Secondary | ICD-10-CM | POA: Diagnosis not present

## 2023-06-09 DIAGNOSIS — L110 Acquired keratosis follicularis: Secondary | ICD-10-CM | POA: Diagnosis not present

## 2023-06-16 DIAGNOSIS — S42254D Nondisplaced fracture of greater tuberosity of right humerus, subsequent encounter for fracture with routine healing: Secondary | ICD-10-CM | POA: Diagnosis not present

## 2023-06-17 ENCOUNTER — Other Ambulatory Visit: Payer: Self-pay | Admitting: Registered Nurse

## 2023-06-18 ENCOUNTER — Telehealth (HOSPITAL_COMMUNITY): Payer: Self-pay

## 2023-06-18 ENCOUNTER — Telehealth: Payer: Self-pay | Admitting: Registered Nurse

## 2023-06-18 ENCOUNTER — Ambulatory Visit (INDEPENDENT_AMBULATORY_CARE_PROVIDER_SITE_OTHER): Admitting: Orthopaedic Surgery

## 2023-06-18 ENCOUNTER — Other Ambulatory Visit: Payer: Self-pay

## 2023-06-18 DIAGNOSIS — S42251D Displaced fracture of greater tuberosity of right humerus, subsequent encounter for fracture with routine healing: Secondary | ICD-10-CM

## 2023-06-18 MED ORDER — GABAPENTIN 600 MG PO TABS
ORAL_TABLET | ORAL | 3 refills | Status: AC
Start: 2023-06-18 — End: ?

## 2023-06-18 NOTE — Telephone Encounter (Signed)
Gabapentin prescription sent to pharmacy.  

## 2023-06-18 NOTE — Telephone Encounter (Signed)
 Spoke to patient that she can proceed with Itamar sleep study.

## 2023-06-18 NOTE — Progress Notes (Signed)
 Post-Op Visit Note   Patient: [name]           Date of Birth: 1950/05/03           MRN: [id number] Visit Date: 06/18/2023 PCP: [place], Betty G, MD   Assessment & Plan: Level follow-up right proximal humerus fracture.  Previous trigger finger injections done well with no further symptoms.  X-ray today right proximal humerus shows healing of the greater roughly fracture.  She is making good progress with therapy.  She can follow-up on an as-needed basis.  Chief Complaint:  Chief Complaint  Patient presents with   Right Shoulder - Fracture, Follow-up    Fall 04/23/2023   left long finger trigger finger follow up   Visit Diagnoses:  1. Closed displaced fracture of greater tuberosity of right humerus with routine healing, subsequent encounter     Plan: Continue therapy her motion is significantly improved.  We reviewed x-rays fracture is healed and she can follow-up as needed once she finishes her therapy.  Follow-Up Instructions: No follow-ups on file.   Orders:  Orders Placed This Encounter  Procedures   XR Shoulder 1V Right   No orders of the defined types were placed in this encounter.   Imaging: No results found.  PMFS History: Patient Active Problem List   Diagnosis Date Noted   Trigger finger, left middle finger 05/27/2023   Closed fracture of humerus, greater tuberosity 04/29/2023   PVC (premature ventricular contraction) 11/05/2022   Hyperthyroidism 05/13/2022   Surgical follow-up care 01/06/2022   Encounter for postoperative wound check 01/06/2022   S/P CABG x 3 11/14/2021   Chest pain at rest 11/06/2021   Myopathy, unspecified 10/18/2021   Tommi Rumps Quervain's disease (radial styloid tenosynovitis) 09/26/2021   NSTEMI (non-ST elevated myocardial infarction) (HCC) 08/22/2021   Nausea vomiting and diarrhea 08/22/2021   Pneumonia 08/22/2021   Chronic systolic CHF (congestive heart failure) (HCC)    Abdominal cramping    History of atherosclerotic heart  disease 07/15/2021   Coronary artery disease involving native coronary artery of native heart without angina pectoris 04/19/2021   Abnormal nuclear cardiac imaging test 03/08/2021   Diabetic peripheral neuropathy (HCC) 12/06/2020   Anxiety disorder 01/27/2020   HNP (herniated nucleus pulposus), lumbar 01/13/2020   PTSD (post-traumatic stress disorder)    Polyneuropathy associated with underlying disease (HCC) 07/26/2019   Allergic rhinitis 07/26/2019   Atherosclerosis of aorta (HCC) 04/04/2019   Myalgia due to statin 11/18/2017   Depression, major, recurrent, in partial remission (HCC) 10/16/2017   Insomnia 10/16/2017   Chronic pain syndrome 09/07/2017   Spinal stenosis of lumbar region 07/08/2017   DOE (dyspnea on exertion) 12/16/2016   Family history of early CAD 12/16/2016   Arthritis of left acromioclavicular joint 08/13/2016   Left shoulder pain 05/12/2016   Cervical spondylosis without myelopathy 05/12/2016   Rotator cuff syndrome, left 05/12/2016   RLS (restless legs syndrome) 05/12/2016   Primary osteoarthritis, left hand 05/12/2016   MELENA 10/18/2008   GASTROPARESIS 05/30/2008   Type 2 diabetes mellitus with neurological complications (HCC) 05/04/2008   HYPERCHOLESTEROLEMIA 05/04/2008   HYPOKALEMIA 05/04/2008   GERD 05/04/2008   Constipation 05/04/2008   HEMATEMESIS 05/04/2008   Essential hypertension 05/04/2008   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HCC)    Anxiety    Arthritis    Diabetes mellitus without complication (HCC)    GERD (gastroesophageal reflux disease)    Heart murmur    Hyperlipidemia  Hypertension    Neuromuscular disorder (HCC)    Palpitations    Pneumonia    PONV (postoperative nausea and vomiting)    PTSD (post-traumatic stress disorder)    Ringing of ears, left     Family History  Problem Relation Age of Onset   Diabetes Mother    Alzheimer's disease Mother    Lung cancer Father    Esophageal cancer Brother     Colon cancer Neg Hx     Past Surgical History:  Procedure Laterality Date   ABDOMINAL HYSTERECTOMY     partial   ACHILLES TENDON SURGERY Left    APPENDECTOMY     BACK SURGERY     BLADDER SUSPENSION     BREAST SURGERY Right 2003   milk duct removed   CARDIAC CATHETERIZATION     CHOLECYSTECTOMY     CORONARY ARTERY BYPASS GRAFT N/A 11/14/2021   Procedure: CORONARY ARTERY BYPASS GRAFTING (CABG) TIMES THREE USING LIMA AND CRYO VEIN.;  Surgeon: Lovett Sox, MD;  Location: [facility] OR;  Service: Open Heart Surgery;  Laterality: N/A;   FRACTURE SURGERY     collar bone   LEFT HEART CATH AND CORONARY ANGIOGRAPHY N/A 03/08/2021   Procedure: LEFT HEART CATH AND CORONARY ANGIOGRAPHY;  Surgeon: [place], Peter M, MD;  Location: [facility] INVASIVE CV LAB;  Service: Cardiovascular;  Laterality: N/A;   NOSE SURGERY     x 2    OTHER SURGICAL HISTORY     Breast Duct removed   PARTIAL HYSTERECTOMY     RECTOCELE REPAIR     TEE WITHOUT CARDIOVERSION N/A 11/14/2021   Procedure: TRANSESOPHAGEAL ECHOCARDIOGRAM (TEE);  Surgeon: Lovett Sox, MD;  Location: [facility] OR;  Service: Open Heart Surgery;  Laterality: N/A;   TONSILLECTOMY     VASCULAR SURGERY Right    right leg   VEIN SURGERY Right    leg   Social History   Occupational History   Occupation: retired    Comment: work part time  Tobacco Use   Smoking status: Never   Smokeless tobacco: Never   Tobacco comments:    Never smoke 11/01/21  Vaping Use   Vaping status: Never Used  Substance and Sexual Activity   Alcohol use: Never   Drug use: No   Sexual activity: Not on file

## 2023-06-22 ENCOUNTER — Telehealth: Payer: Self-pay | Admitting: Family Medicine

## 2023-06-22 NOTE — Telephone Encounter (Signed)
 Copied from CRM [id number]. Topic: Appointments - Appointment Scheduling >> Jun 22, 2023  2:36 PM Kathryne Eriksson wrote: Caryn Bee [phone] ext [phone]  [facility] on behalf of patient, wanting to schedule her in to have a bone dentistry screening completed, aiming towards mid July to have this completed.

## 2023-06-23 ENCOUNTER — Other Ambulatory Visit (HOSPITAL_COMMUNITY)
Admission: RE | Admit: 2023-06-23 | Discharge: 2023-06-23 | Disposition: A | Discharge status: Home / Self Care | Source: Ambulatory Visit | Attending: Cardiology | Admitting: Cardiology

## 2023-06-23 DIAGNOSIS — E2839 Other primary ovarian failure: Secondary | ICD-10-CM

## 2023-06-23 DIAGNOSIS — I5022 Chronic systolic (congestive) heart failure: Secondary | ICD-10-CM | POA: Insufficient documentation

## 2023-06-23 LAB — BASIC METABOLIC PANEL
Anion gap: 11 (ref 5–15)
BUN: 16 mg/dL (ref 8–23)
CO2: 24 mmol/L (ref 22–32)
Calcium: 9.4 mg/dL (ref 8.9–10.3)
Chloride: 104 mmol/L (ref 98–111)
Creatinine, Ser: 0.9 mg/dL (ref 0.44–1.00)
GFR, Estimated: >60 mL/min (ref >60)
Glucose, Bld: 103 mg/dL — ABNORMAL HIGH (ref 70–99)
Potassium: 3.7 mmol/L (ref 3.5–5.1)
Sodium: 139 mmol/L (ref 135–145)

## 2023-06-23 NOTE — Telephone Encounter (Signed)
 It is ok to order DEXA. Thanks, BJ

## 2023-06-24 NOTE — Progress Notes (Signed)
 Subjective:    Patient ID: [name], female    DOB: 13-May-1950, 73 y.o.   MRN: [id number]  HPI: [name] is a 73 y.o. female who returns for follow up appointment for chronic pain and medication refill. She states her  pain is located in her neck radiating into her left shoulder and lower back pain. She also reports Right Shoulder pain, Ortho Following. She rates her pain 7. Her current exercise regime is walking and performing stretching exercises. She is also receiving physical therapy twice a week.   [name] Morphine equivalent is 40.00 MME. She is also prescribed Clonazepam  by Dr. Swaziland .We have discussed the black box warning of using opioids and benzodiazepines. I highlighted the dangers of using these drugs together and discussed the adverse events including respiratory suppression, overdose, cognitive impairment and importance of compliance with current regimen. We will continue to monitor and adjust as indicated.    Oral Swab ordered today.    Pain Inventory Average Pain 8 Pain Right Now 7 My pain is constant, dull, tingling, and aching  In the last 24 hours, has pain interfered with the following? General activity 10 just depends  Relation with others 10 just depends  Enjoyment of life 0 but she doesn't do much but she doesn't give up on herself  What TIME of day is your pain at its worst? morning  and varies Sleep (in general)  between fair and poor. Pt states she is about to have a sleep study done  Pain is worse with: walking, bending, and standing Pain improves with: rest, heat/ice, and medication Relief from Meds: 10  Family History  Problem Relation Age of Onset   Diabetes Mother    Alzheimer's disease Mother    Lung cancer Father    Esophageal cancer Brother    Colon cancer Neg Hx    Social History   Socioeconomic History   Marital status: Widowed    Spouse name: Not on file   Number of children: 1   Years of education: Not on file    Highest education level: Some college, no degree  Occupational History   Occupation: retired    Comment: work part time  Tobacco Use   Smoking status: Never   Smokeless tobacco: Never   Tobacco comments:    Never smoke 11/01/21  Vaping Use   Vaping status: Never Used  Substance and Sexual Activity   Alcohol use: Never   Drug use: No   Sexual activity: Not on file  Other Topics Concern   Not on file  Social History Narrative   Not on file   Social Drivers of Health   Financial Resource Strain: Medium Risk (04/13/2023)   Overall Financial Resource Strain (CARDIA)    Difficulty of Paying Living Expenses: Somewhat hard  Food Insecurity: Patient Declined (04/13/2023)   Hunger Vital Sign    Worried About Running Out of Food in the Last Year: Patient declined    Ran Out of Food in the Last Year: Patient declined  Transportation Needs: No Transportation Needs (04/13/2023)   PRAPARE - Administrator, Civil Service (Medical): No    Lack of Transportation (Non-Medical): No  Physical Activity: Insufficiently Active (04/13/2023)   Exercise Vital Sign    Days of Exercise per Week: 3 days    Minutes of Exercise per Session: 10 min  Stress: Stress Concern Present (04/13/2023)   [employer] of Occupational Health - Occupational Stress Questionnaire  Feeling of Stress : Rather much  Social Connections: Moderately Isolated (04/13/2023)   Social Connection and Isolation Panel [NHANES]    Frequency of Communication with Friends and Family: Twice a week    Frequency of Social Gatherings with Friends and Family: Once a week    Attends Religious Services: More than 4 times per year    Active Member of [employer] or Organizations: No    Attends Banker Meetings: Not on file    Marital Status: Widowed   Past Surgical History:  Procedure Laterality Date   ABDOMINAL HYSTERECTOMY     partial   ACHILLES TENDON SURGERY Left    APPENDECTOMY     BACK SURGERY     BLADDER  SUSPENSION     BREAST SURGERY Right 2003   milk duct removed   CARDIAC CATHETERIZATION     CHOLECYSTECTOMY     CORONARY ARTERY BYPASS GRAFT N/A 11/14/2021   Procedure: CORONARY ARTERY BYPASS GRAFTING (CABG) TIMES THREE USING LIMA AND CRYO VEIN.;  Surgeon: Lovett Sox, MD;  Location: [facility] OR;  Service: Open Heart Surgery;  Laterality: N/A;   FRACTURE SURGERY     collar bone   LEFT HEART CATH AND CORONARY ANGIOGRAPHY N/A 03/08/2021   Procedure: LEFT HEART CATH AND CORONARY ANGIOGRAPHY;  Surgeon: Swaziland, Peter M, MD;  Location: [facility] INVASIVE CV LAB;  Service: Cardiovascular;  Laterality: N/A;   NOSE SURGERY     x 2    OTHER SURGICAL HISTORY     Breast Duct removed   PARTIAL HYSTERECTOMY     RECTOCELE REPAIR     TEE WITHOUT CARDIOVERSION N/A 11/14/2021   Procedure: TRANSESOPHAGEAL ECHOCARDIOGRAM (TEE);  Surgeon: Lovett Sox, MD;  Location: [facility] OR;  Service: Open Heart Surgery;  Laterality: N/A;   TONSILLECTOMY     VASCULAR SURGERY Right    right leg   VEIN SURGERY Right    leg   Past Surgical History:  Procedure Laterality Date   ABDOMINAL HYSTERECTOMY     partial   ACHILLES TENDON SURGERY Left    APPENDECTOMY     BACK SURGERY     BLADDER SUSPENSION     BREAST SURGERY Right 2003   milk duct removed   CARDIAC CATHETERIZATION     CHOLECYSTECTOMY     CORONARY ARTERY BYPASS GRAFT N/A 11/14/2021   Procedure: CORONARY ARTERY BYPASS GRAFTING (CABG) TIMES THREE USING LIMA AND CRYO VEIN.;  Surgeon: Lovett Sox, MD;  Location: [facility] OR;  Service: Open Heart Surgery;  Laterality: N/A;   FRACTURE SURGERY     collar bone   LEFT HEART CATH AND CORONARY ANGIOGRAPHY N/A 03/08/2021   Procedure: LEFT HEART CATH AND CORONARY ANGIOGRAPHY;  Surgeon: Swaziland, Peter M, MD;  Location: [facility] INVASIVE CV LAB;  Service: Cardiovascular;  Laterality: N/A;   NOSE SURGERY     x 2    OTHER SURGICAL HISTORY     Breast Duct removed   PARTIAL HYSTERECTOMY     RECTOCELE REPAIR     TEE WITHOUT CARDIOVERSION N/A  11/14/2021   Procedure: TRANSESOPHAGEAL ECHOCARDIOGRAM (TEE);  Surgeon: Lovett Sox, MD;  Location: [facility] OR;  Service: Open Heart Surgery;  Laterality: N/A;   TONSILLECTOMY     VASCULAR SURGERY Right    right leg   VEIN SURGERY Right    leg   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HCC)    Anxiety    Arthritis    Diabetes mellitus without complication (HCC)  GERD (gastroesophageal reflux disease)    Heart murmur    Hyperlipidemia    Hypertension    Neuromuscular disorder (HCC)    Palpitations    Pneumonia    PONV (postoperative nausea and vomiting)    PTSD (post-traumatic stress disorder)    Ringing of ears, left    BP 101/67   Pulse 72   Resp 16   Ht 5\' 8"  (1.727 m)   Wt 167 lb 9.6 oz (76 kg)   SpO2 97%   BMI 25.48 kg/m   Opioid Risk Score:   Fall Risk Score:  `1  Depression screen PHQ 2/9     06/25/2023    2:00 PM 04/28/2023    2:01 PM 04/28/2023    1:49 PM 01/12/2023    4:26 PM 12/25/2022    3:32 PM 12/01/2022    3:37 PM 08/28/2022    2:09 PM  Depression screen PHQ 2/9  Decreased Interest 0 0 0 0 0 0 0  Down, Depressed, Hopeless 0 0 0 0 0 2 0  PHQ - 2 Score 0 0 0 0 0 2 0  Altered sleeping    0  2   Tired, decreased energy    0  2   Change in appetite    0  2   Feeling bad or failure about yourself     0  0   Trouble concentrating    0  1   Moving slowly or fidgety/restless    0  0   Suicidal thoughts    0  0   PHQ-9 Score    0  9   Difficult doing work/chores    Not difficult at all  Somewhat difficult     Review of Systems  Musculoskeletal:  Positive for back pain.       B/l leg B/l shoulder Left hand B/l feet        Objective:   Physical Exam Vitals and nursing note reviewed.  Constitutional:      Appearance: Normal appearance.  Cardiovascular:     Rate and Rhythm: Normal rate and regular rhythm.     Pulses: Normal pulses.     Heart sounds: Normal heart sounds.  Pulmonary:     Effort: Pulmonary effort is normal.      Breath sounds: Normal breath sounds.  Musculoskeletal:     Comments: Normal Muscle Bulk and Muscle Testing Reveals:  Upper Extremities: Right: Decreased ROM 45 Degrees  and Muscle Strength  5/5 Right AC Joint Tenderness Left: Upper Extremity: Decreased ROM 90 Degrees and Muscle Strength 5/5 Thoracic Paraspinal Tenderness: T-1-T-2 Mainly Right Side  Lumbar Paraspinal Tenderness: L-4-L-5 Lower Extremities: Full ROM and Muscle Strength 5/5 Narrow Based  Gait     Skin:    General: Skin is warm and dry.  Neurological:     Mental Status: She is alert and oriented to person, place, and time.  Psychiatric:        Mood and Affect: Mood normal.        Behavior: Behavior normal.         Assessment & Plan:  1.Chronic cervicalgia with documented spondylosis on MRI, per Dr. Riley Kill Note and Cervical Radiculitis: Continue Gabapentin,  Continue HEP as Tolerated. 06/25/2023 2. Left shoulder pain most consistent with left rotator cuff syndrome. Mild DJD.  Continue HEP as tolerated.Continue to monitor. 03/20/20245. Refilled:  Hydrocodone 10/325 mg one tablet every 6 hours as needed for pain. #120. Second script sent for the following  month. We will continue the opioid monitoring program, this consists of regular clinic visits, examinations, urine drug screen, pill counts as well as use of [place] Controlled Substance Reporting system. A 12 month History has been reviewed on the [place] Controlled Substance Reporting System on 06/25/2023. 3. Left wrist/finger pain most consistent with OA, ?post traumatic. 06/25/2023 4. Anxiety: Continue Klonopin, PCP prescribing. Continue to monitor. 06/25/2023 5. Restless Leg syndrome: Continue Gabapentin. 06/25/2023 7. Sacroiliac inflammation: S/P Right  SI injection on 09/20/2019, with No relief noted. Ms. Lauman seen Dr August Saucer in regards to her pain. Dr Wynetta Emery. Following.  Continue to Monitor. 06/25/2023 8. Myofascial Pain: Continue Robaxin as needed.  06/25/2023.  9. Bilateral feet with neuropathic pain: Increase Gabapentin 600 mg 4 times a day. Continue to monitor. 06/25/2023. 10.  Herniated Nucleus Pulposus L2-3 with recurrent disc rupture: S/P  Posterior Lumbar Interbody Fusion  - Lumbar two-Lumbar three on 01/13/2020 by Dr Wynetta Emery.  Dr Wynetta Emery Following. 06/25/2023 11. Left Ankle Pain:No complaints today.  Ortho Following. . Continue to monitor. 06/25/2023  12. Polyarthralgia: Continue HEP as Tolerated. Continue to monitor. 06/25/2023 13. Diabetic Peripheral Neuropathy. Continue Gabapentin. Continue to Monitor. Continue to Monitor.  06/25/2023 14, Fall: subsequent encounter: No Falls this month. Educated in [place] Prevention: She verbalizes understanding.  15. Acute Right Shoulder Pain: Ortho Following.   F/U in 2 months

## 2023-06-24 NOTE — Telephone Encounter (Signed)
 Dexa ordered & form faxed to [facility].

## 2023-06-25 ENCOUNTER — Encounter: Payer: Medicare Other | Attending: Registered Nurse | Admitting: Registered Nurse

## 2023-06-25 VITALS — BP 101/67 | HR 72 | Resp 16 | Ht 68.0 in | Wt 167.6 lb

## 2023-06-25 DIAGNOSIS — M5412 Radiculopathy, cervical region: Secondary | ICD-10-CM | POA: Insufficient documentation

## 2023-06-25 DIAGNOSIS — M47812 Spondylosis without myelopathy or radiculopathy, cervical region: Secondary | ICD-10-CM | POA: Insufficient documentation

## 2023-06-25 DIAGNOSIS — M542 Cervicalgia: Secondary | ICD-10-CM | POA: Insufficient documentation

## 2023-06-25 DIAGNOSIS — Z79891 Long term (current) use of opiate analgesic: Secondary | ICD-10-CM | POA: Diagnosis not present

## 2023-06-25 DIAGNOSIS — Z5181 Encounter for therapeutic drug level monitoring: Secondary | ICD-10-CM | POA: Insufficient documentation

## 2023-06-25 DIAGNOSIS — M2550 Pain in unspecified joint: Secondary | ICD-10-CM | POA: Diagnosis not present

## 2023-06-25 DIAGNOSIS — G894 Chronic pain syndrome: Secondary | ICD-10-CM | POA: Insufficient documentation

## 2023-06-25 DIAGNOSIS — M25511 Pain in right shoulder: Secondary | ICD-10-CM | POA: Diagnosis not present

## 2023-06-25 DIAGNOSIS — M545 Low back pain, unspecified: Secondary | ICD-10-CM | POA: Insufficient documentation

## 2023-06-25 DIAGNOSIS — G8929 Other chronic pain: Secondary | ICD-10-CM | POA: Insufficient documentation

## 2023-06-28 ENCOUNTER — Encounter: Payer: Self-pay | Admitting: Family Medicine

## 2023-06-28 LAB — DRUG TOX MONITOR 1 W/CONF, ORAL FLD
Alprazolam: NEGATIVE ng/mL (ref <0.50)
Aminoclonazepam: 8.89 ng/mL — ABNORMAL HIGH (ref <0.50)
Amphetamines: NEGATIVE ng/mL (ref <10)
Barbiturates: NEGATIVE ng/mL (ref <10)
Benzodiazepines: POSITIVE ng/mL — AB (ref <0.50)
Buprenorphine: NEGATIVE ng/mL (ref <0.10)
Chlordiazepoxide: NEGATIVE ng/mL (ref <0.50)
Clonazepam: 0.87 ng/mL — ABNORMAL HIGH (ref <0.50)
Cocaine: NEGATIVE ng/mL (ref <5.0)
Codeine: NEGATIVE ng/mL (ref <2.5)
Diazepam: NEGATIVE ng/mL (ref <0.50)
Dihydrocodeine: 13.8 ng/mL — ABNORMAL HIGH (ref <2.5)
Fentanyl: NEGATIVE ng/mL (ref <0.10)
Flunitrazepam: NEGATIVE ng/mL (ref <0.50)
Flurazepam: NEGATIVE ng/mL (ref <0.50)
Heroin Metabolite: NEGATIVE ng/mL (ref <1.0)
Hydrocodone: >250 ng/mL — ABNORMAL HIGH (ref <2.5)
Hydromorphone: NEGATIVE ng/mL (ref <2.5)
Lorazepam: NEGATIVE ng/mL (ref <0.50)
MARIJUANA: NEGATIVE ng/mL (ref <2.5)
MDMA: NEGATIVE ng/mL (ref <10)
Meprobamate: NEGATIVE ng/mL (ref <2.5)
Methadone: NEGATIVE ng/mL (ref <5.0)
Midazolam: NEGATIVE ng/mL (ref <0.50)
Morphine: NEGATIVE ng/mL (ref <2.5)
Nicotine Metabolite: NEGATIVE ng/mL (ref <5.0)
Nordiazepam: NEGATIVE ng/mL (ref <0.50)
Norhydrocodone: 22.3 ng/mL — ABNORMAL HIGH (ref <2.5)
Noroxycodone: NEGATIVE ng/mL (ref <2.5)
Opiates: POSITIVE ng/mL — AB (ref <2.5)
Oxazepam: NEGATIVE ng/mL (ref <0.50)
Oxycodone: NEGATIVE ng/mL (ref <2.5)
Oxymorphone: NEGATIVE ng/mL (ref <2.5)
Phencyclidine: NEGATIVE ng/mL (ref <10)
Tapentadol: NEGATIVE ng/mL (ref <5.0)
Temazepam: NEGATIVE ng/mL (ref <0.50)
Tramadol: NEGATIVE ng/mL (ref <5.0)
Triazolam: NEGATIVE ng/mL (ref <0.50)
Zolpidem: NEGATIVE ng/mL (ref <5.0)

## 2023-06-28 LAB — DRUG TOX ALC METAB W/CON, ORAL FLD: Alcohol Metabolite: NEGATIVE ng/mL (ref <25)

## 2023-06-30 ENCOUNTER — Other Ambulatory Visit: Payer: Self-pay | Admitting: Family Medicine

## 2023-06-30 DIAGNOSIS — K120 Recurrent oral aphthae: Secondary | ICD-10-CM

## 2023-06-30 MED ORDER — TRIAMCINOLONE ACETONIDE 0.1 % MT PSTE: 1.0000 | PASTE | Freq: Every day | OROMUCOSAL | 0 refills | Status: AC | PRN

## 2023-07-03 ENCOUNTER — Telehealth: Payer: Self-pay | Admitting: Registered Nurse

## 2023-07-03 MED ORDER — HYDROCODONE-ACETAMINOPHEN 10-325 MG PO TABS: 1.0000 | ORAL_TABLET | Freq: Four times a day (QID) | ORAL | 0 refills | Status: DC | PRN

## 2023-07-03 NOTE — Telephone Encounter (Signed)
 PMP was Reviewed.  April Hydrocodone prescription sent to pharmacy.  Ms. Gaber is aware via My-Chart message.

## 2023-07-09 DIAGNOSIS — H353131 Nonexudative age-related macular degeneration, bilateral, early dry stage: Secondary | ICD-10-CM | POA: Diagnosis not present

## 2023-07-09 DIAGNOSIS — H2511 Age-related nuclear cataract, right eye: Secondary | ICD-10-CM | POA: Diagnosis not present

## 2023-07-09 DIAGNOSIS — E119 Type 2 diabetes mellitus without complications: Secondary | ICD-10-CM | POA: Diagnosis not present

## 2023-07-15 ENCOUNTER — Encounter: Payer: Self-pay | Admitting: Family Medicine

## 2023-07-15 DIAGNOSIS — E876 Hypokalemia: Secondary | ICD-10-CM

## 2023-07-16 MED ORDER — POTASSIUM CHLORIDE CRYS ER 20 MEQ PO TBCR: 20.0000 meq | EXTENDED_RELEASE_TABLET | Freq: Every day | ORAL | 5 refills | Status: DC

## 2023-07-29 DIAGNOSIS — K080 Exfoliation of teeth due to systemic causes: Secondary | ICD-10-CM | POA: Diagnosis not present

## 2023-08-07 ENCOUNTER — Encounter: Payer: Self-pay | Admitting: Radiology

## 2023-08-19 DIAGNOSIS — H2511 Age-related nuclear cataract, right eye: Secondary | ICD-10-CM | POA: Diagnosis not present

## 2023-08-20 ENCOUNTER — Encounter: Payer: Self-pay | Admitting: Family Medicine

## 2023-08-20 DIAGNOSIS — H938X9 Other specified disorders of ear, unspecified ear: Secondary | ICD-10-CM

## 2023-08-21 DIAGNOSIS — H25811 Combined forms of age-related cataract, right eye: Secondary | ICD-10-CM | POA: Diagnosis not present

## 2023-08-21 MED ORDER — ONDANSETRON 4 MG PO TBDP: 4.0000 mg | ORAL_TABLET | Freq: Every day | ORAL | 2 refills | Status: DC | PRN

## 2023-08-25 ENCOUNTER — Encounter: Attending: Registered Nurse | Admitting: Registered Nurse

## 2023-08-25 ENCOUNTER — Encounter: Payer: Self-pay | Admitting: Registered Nurse

## 2023-08-25 VITALS — BP 111/68 | HR 64 | Ht 68.0 in | Wt 171.0 lb

## 2023-08-25 DIAGNOSIS — G894 Chronic pain syndrome: Secondary | ICD-10-CM | POA: Diagnosis not present

## 2023-08-25 DIAGNOSIS — M47812 Spondylosis without myelopathy or radiculopathy, cervical region: Secondary | ICD-10-CM | POA: Insufficient documentation

## 2023-08-25 DIAGNOSIS — M25511 Pain in right shoulder: Secondary | ICD-10-CM | POA: Diagnosis not present

## 2023-08-25 DIAGNOSIS — Z79891 Long term (current) use of opiate analgesic: Secondary | ICD-10-CM | POA: Diagnosis not present

## 2023-08-25 DIAGNOSIS — G8929 Other chronic pain: Secondary | ICD-10-CM | POA: Insufficient documentation

## 2023-08-25 DIAGNOSIS — M5412 Radiculopathy, cervical region: Secondary | ICD-10-CM | POA: Diagnosis not present

## 2023-08-25 DIAGNOSIS — M542 Cervicalgia: Secondary | ICD-10-CM | POA: Insufficient documentation

## 2023-08-25 DIAGNOSIS — Z5181 Encounter for therapeutic drug level monitoring: Secondary | ICD-10-CM | POA: Diagnosis not present

## 2023-08-25 DIAGNOSIS — Z794 Long term (current) use of insulin: Secondary | ICD-10-CM

## 2023-08-25 DIAGNOSIS — M545 Low back pain, unspecified: Secondary | ICD-10-CM | POA: Diagnosis not present

## 2023-08-25 DIAGNOSIS — E1142 Type 2 diabetes mellitus with diabetic polyneuropathy: Secondary | ICD-10-CM | POA: Diagnosis not present

## 2023-08-25 MED ORDER — HYDROCODONE-ACETAMINOPHEN 10-325 MG PO TABS: 1.0000 | ORAL_TABLET | Freq: Four times a day (QID) | ORAL | 0 refills | Status: DC | PRN

## 2023-08-25 MED ORDER — HYDROCODONE-ACETAMINOPHEN 10-325 MG PO TABS
1.0000 | ORAL_TABLET | Freq: Four times a day (QID) | ORAL | 0 refills | Status: DC | PRN
Start: 2023-08-25 — End: 2023-08-25

## 2023-08-25 NOTE — Progress Notes (Signed)
 Subjective:    Patient ID: [name], female    DOB: 1950-09-27, 73 y.o.   MRN: [id number]  HPI: [name] is a 73 y.o. female who returns for follow up appointment for chronic pain and medication refill. She states her pain is located in her neck radiating into her let shoulder, right shoulder pain, lower back and bilateral feet with tingling and burning. She  rates her pain 7. Her current exercise regime is walking and performing stretching exercises.  Ms. Felix Morphine  equivalent is 40.00 MME.  She is also prescribed Clonazepam   by Dr. Swaziland  .We have discussed the black box warning of using opioids and benzodiazepines. I highlighted the dangers of using these drugs together and discussed the adverse events including respiratory suppression, overdose, cognitive impairment and importance of compliance with current regimen. We will continue to monitor and adjust as indicated.   Last Oral Swab was Performed on 06/25/2023, it was consistent.      Pain Inventory Average Pain 7 Pain Right Now 7 My pain is burning, dull, stabbing, and aching  In the last 24 hours, has pain interfered with the following? General activity 7 Relation with others 7 Enjoyment of life 7 What TIME of day is your pain at its worst? morning , daytime, evening, and night Sleep (in general) Fair  Pain is worse with: bending, sitting, inactivity, standing, and some activites Pain improves with: rest, heat/ice, pacing activities, medication, and injections Relief from Meds: 10  Family History  Problem Relation Age of Onset   Diabetes Mother    Alzheimer's disease Mother    Lung cancer Father    Esophageal cancer Brother    Colon cancer Neg Hx    Social History   Socioeconomic History   Marital status: Widowed    Spouse name: Not on file   Number of children: 1   Years of education: Not on file   Highest education level: Some college, no degree  Occupational History   Occupation:  retired    Comment: work part time  Tobacco Use   Smoking status: Never   Smokeless tobacco: Never   Tobacco comments:    Never smoke 11/01/21  Vaping Use   Vaping status: Never Used  Substance and Sexual Activity   Alcohol use: Never   Drug use: No   Sexual activity: Not on file  Other Topics Concern   Not on file  Social History Narrative   Not on file   Social Drivers of Health   Financial Resource Strain: Medium Risk (04/13/2023)   Overall Financial Resource Strain (CARDIA)    Difficulty of Paying Living Expenses: Somewhat hard  Food Insecurity: Patient Declined (04/13/2023)   Hunger Vital Sign    Worried About Running Out of Food in the Last Year: Patient declined    Ran Out of Food in the Last Year: Patient declined  Transportation Needs: No Transportation Needs (04/13/2023)   PRAPARE - Administrator, Civil Service (Medical): No    Lack of Transportation (Non-Medical): No  Physical Activity: Insufficiently Active (04/13/2023)   Exercise Vital Sign    Days of Exercise per Week: 3 days    Minutes of Exercise per Session: 10 min  Stress: Stress Concern Present (04/13/2023)   [employer] of Occupational Health - Occupational Stress Questionnaire    Feeling of Stress : Rather much  Social Connections: Moderately Isolated (04/13/2023)   Social Connection and Isolation Panel [NHANES]    Frequency of  Communication with Friends and Family: Twice a week    Frequency of Social Gatherings with Friends and Family: Once a week    Attends Religious Services: More than 4 times per year    Active Member of [employer] or Organizations: No    Attends Banker Meetings: Not on file    Marital Status: Widowed   Past Surgical History:  Procedure Laterality Date   ABDOMINAL HYSTERECTOMY     partial   ACHILLES TENDON SURGERY Left    APPENDECTOMY     BACK SURGERY     BLADDER SUSPENSION     BREAST SURGERY Right 2003   milk duct removed   CARDIAC CATHETERIZATION      CHOLECYSTECTOMY     CORONARY ARTERY BYPASS GRAFT N/A 11/14/2021   Procedure: CORONARY ARTERY BYPASS GRAFTING (CABG) TIMES THREE USING LIMA AND CRYO VEIN.;  Surgeon: Shon Downing, MD;  Location: [facility] OR;  Service: Open Heart Surgery;  Laterality: N/A;   FRACTURE SURGERY     collar bone   LEFT HEART CATH AND CORONARY ANGIOGRAPHY N/A 03/08/2021   Procedure: LEFT HEART CATH AND CORONARY ANGIOGRAPHY;  Surgeon: Swaziland, Peter M, MD;  Location: [facility] INVASIVE CV LAB;  Service: Cardiovascular;  Laterality: N/A;   NOSE SURGERY     x 2    OTHER SURGICAL HISTORY     Breast Duct removed   PARTIAL HYSTERECTOMY     RECTOCELE REPAIR     TEE WITHOUT CARDIOVERSION N/A 11/14/2021   Procedure: TRANSESOPHAGEAL ECHOCARDIOGRAM (TEE);  Surgeon: Shon Downing, MD;  Location: [facility] OR;  Service: Open Heart Surgery;  Laterality: N/A;   TONSILLECTOMY     VASCULAR SURGERY Right    right leg   VEIN SURGERY Right    leg   Past Surgical History:  Procedure Laterality Date   ABDOMINAL HYSTERECTOMY     partial   ACHILLES TENDON SURGERY Left    APPENDECTOMY     BACK SURGERY     BLADDER SUSPENSION     BREAST SURGERY Right 2003   milk duct removed   CARDIAC CATHETERIZATION     CHOLECYSTECTOMY     CORONARY ARTERY BYPASS GRAFT N/A 11/14/2021   Procedure: CORONARY ARTERY BYPASS GRAFTING (CABG) TIMES THREE USING LIMA AND CRYO VEIN.;  Surgeon: Shon Downing, MD;  Location: [facility] OR;  Service: Open Heart Surgery;  Laterality: N/A;   FRACTURE SURGERY     collar bone   LEFT HEART CATH AND CORONARY ANGIOGRAPHY N/A 03/08/2021   Procedure: LEFT HEART CATH AND CORONARY ANGIOGRAPHY;  Surgeon: Swaziland, Peter M, MD;  Location: [facility] INVASIVE CV LAB;  Service: Cardiovascular;  Laterality: N/A;   NOSE SURGERY     x 2    OTHER SURGICAL HISTORY     Breast Duct removed   PARTIAL HYSTERECTOMY     RECTOCELE REPAIR     TEE WITHOUT CARDIOVERSION N/A 11/14/2021   Procedure: TRANSESOPHAGEAL ECHOCARDIOGRAM (TEE);  Surgeon: Shon Downing, MD;   Location: [facility] OR;  Service: Open Heart Surgery;  Laterality: N/A;   TONSILLECTOMY     VASCULAR SURGERY Right    right leg   VEIN SURGERY Right    leg   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HCC)    Anxiety    Arthritis    Diabetes mellitus without complication (HCC)    GERD (gastroesophageal reflux disease)    Heart murmur    Hyperlipidemia    Hypertension    Neuromuscular disorder (HCC)  Palpitations    Pneumonia    PONV (postoperative nausea and vomiting)    PTSD (post-traumatic stress disorder)    Ringing of ears, left    BP 111/68 (BP Location: Left Arm, Patient Position: Sitting, Cuff Size: Large)   Pulse 64   Ht 5\' 8"  (1.727 m)   Wt 171 lb (77.6 kg)   SpO2 96%   BMI 26.00 kg/m   Opioid Risk Score:   Fall Risk Score:  `1  Depression screen PHQ 2/9     08/25/2023    2:08 PM 06/25/2023    2:00 PM 04/28/2023    2:01 PM 04/28/2023    1:49 PM 01/12/2023    4:26 PM 12/25/2022    3:32 PM 12/01/2022    3:37 PM  Depression screen PHQ 2/9  Decreased Interest 0 0 0 0 0 0 0  Down, Depressed, Hopeless 0 0 0 0 0 0 2  PHQ - 2 Score 0 0 0 0 0 0 2  Altered sleeping 0    0  2  Tired, decreased energy 0    0  2  Change in appetite 0    0  2  Feeling bad or failure about yourself  0    0  0  Trouble concentrating 0    0  1  Moving slowly or fidgety/restless 0    0  0  Suicidal thoughts 0    0  0  PHQ-9 Score 0    0  9  Difficult doing work/chores Not difficult at all    Not difficult at all  Somewhat difficult     Review of Systems  Musculoskeletal:  Positive for back pain, joint swelling and myalgias.       Bilateral shoulder pain, lower back pain, bilateral foot pain  All other systems reviewed and are negative.      Objective:   Physical Exam Vitals and nursing note reviewed.  Constitutional:      Appearance: Normal appearance.  Cardiovascular:     Rate and Rhythm: Normal rate and regular rhythm.     Pulses: Normal pulses.     Heart  sounds: Normal heart sounds.  Pulmonary:     Effort: Pulmonary effort is normal.     Breath sounds: Normal breath sounds.  Musculoskeletal:     Comments: Normal Muscle Bulk and Muscle Testing Reveals:  Upper Extremities: Right: Decreased ROM 90 Degrees and Muscle Strength 5/5 Left Upper Extremity: Decreased ROM 45 Degrees and Muscle Strength 5/5 Bilateral AC Joint Tenderness Lumbar Paraspinal Tenderness: L-4-L-5 Lower Extremities : Full ROM and Muscle Strength 5/5 Arises from Table with ease Narrow Based Gait     Skin:    General: Skin is warm and dry.  Neurological:     Mental Status: She is alert and oriented to person, place, and time.  Psychiatric:        Mood and Affect: Mood normal.        Behavior: Behavior normal.         Assessment & Plan:  1.Chronic cervicalgia with documented spondylosis on MRI, per Dr. Rachel Budds Note and Cervical Radiculitis: Continue Gabapentin ,  Continue HEP as Tolerated. 08/25/2023 2. Left shoulder pain most consistent with left rotator cuff syndrome. Mild DJD.  Continue HEP as tolerated.Continue to monitor. 05/20/20245. Refilled:  Hydrocodone  10/325 mg one tablet every 6 hours as needed for pain. #120. Second script sent for the following month. We will continue the opioid monitoring program, this consists of regular  clinic visits, examinations, urine drug screen, pill counts as well as use of [place]  Controlled Substance Reporting system. A 12 month History has been reviewed on the   Controlled Substance Reporting System on 08/25/2023. 3. Left wrist/finger pain most consistent with OA, ?post traumatic. 08/25/2023 4. Anxiety: Continue Klonopin , PCP prescribing. Continue to monitor. 08/25/2023 5. Restless Leg syndrome: Continue Gabapentin . 08/25/2023 7. Sacroiliac inflammation: S/P Right  SI injection on 09/20/2019, with No relief noted. Ms. Schneck seen Dr Rozelle Corning in regards to her pain. Dr Lamon Pillow. Following.  Continue to Monitor.  08/25/2023 8. Myofascial Pain: Continue Robaxin  as needed. 08/25/2023.  9. Bilateral feet with neuropathic pain: Increase Gabapentin  600 mg 4 times a day. Continue to monitor. 08/25/2023. 10.  Herniated Nucleus Pulposus L2-3 with recurrent disc rupture: S/P  Posterior Lumbar Interbody Fusion  - Lumbar two-Lumbar three on 01/13/2020 by Dr Lamon Pillow.  Dr Lamon Pillow Following. 06/25/2023 11. Left Ankle Pain:No complaints today.  Ortho Following. . Continue to monitor. 08/25/2023  12. Polyarthralgia: Continue HEP as Tolerated. Continue to monitor. 08/25/2023 13. Diabetic Peripheral Neuropathy. Continue Gabapentin . Continue to Monitor. Continue to Monitor.  08/25/2023 14, Fall: subsequent encounter: No Falls this month. Educated in [place] Prevention: She verbalizes understanding.  15. Chronic Right Shoulder Pain: Ortho Following.    F/U in 2 months

## 2023-08-31 ENCOUNTER — Other Ambulatory Visit (HOSPITAL_COMMUNITY): Payer: Self-pay | Admitting: Cardiology

## 2023-09-02 ENCOUNTER — Encounter (HOSPITAL_COMMUNITY): Payer: Self-pay | Admitting: Cardiology

## 2023-09-02 DIAGNOSIS — L570 Actinic keratosis: Secondary | ICD-10-CM | POA: Diagnosis not present

## 2023-09-02 MED ORDER — CLOPIDOGREL BISULFATE 75 MG PO TABS: 75.0000 mg | ORAL_TABLET | Freq: Every day | ORAL | 3 refills | Status: AC

## 2023-09-02 NOTE — Addendum Note (Signed)
 Addended by: Doll French on: 09/02/2023 10:37 AM   Modules accepted: Orders

## 2023-09-09 ENCOUNTER — Encounter (INDEPENDENT_AMBULATORY_CARE_PROVIDER_SITE_OTHER): Payer: Self-pay

## 2023-09-15 DIAGNOSIS — M79671 Pain in right foot: Secondary | ICD-10-CM | POA: Diagnosis not present

## 2023-09-15 DIAGNOSIS — L110 Acquired keratosis follicularis: Secondary | ICD-10-CM | POA: Diagnosis not present

## 2023-09-15 DIAGNOSIS — I739 Peripheral vascular disease, unspecified: Secondary | ICD-10-CM | POA: Diagnosis not present

## 2023-09-15 DIAGNOSIS — E1140 Type 2 diabetes mellitus with diabetic neuropathy, unspecified: Secondary | ICD-10-CM | POA: Diagnosis not present

## 2023-09-17 ENCOUNTER — Encounter (INDEPENDENT_AMBULATORY_CARE_PROVIDER_SITE_OTHER): Payer: Self-pay | Admitting: Physician Assistant

## 2023-09-17 ENCOUNTER — Ambulatory Visit (INDEPENDENT_AMBULATORY_CARE_PROVIDER_SITE_OTHER): Admitting: Physician Assistant

## 2023-09-17 VITALS — BP 123/77 | HR 75 | Ht 68.0 in | Wt 162.0 lb

## 2023-09-17 DIAGNOSIS — H9313 Tinnitus, bilateral: Secondary | ICD-10-CM | POA: Diagnosis not present

## 2023-09-17 DIAGNOSIS — E041 Nontoxic single thyroid nodule: Secondary | ICD-10-CM

## 2023-09-17 DIAGNOSIS — H6993 Unspecified Eustachian tube disorder, bilateral: Secondary | ICD-10-CM

## 2023-09-17 DIAGNOSIS — J3089 Other allergic rhinitis: Secondary | ICD-10-CM

## 2023-09-17 DIAGNOSIS — H6990 Unspecified Eustachian tube disorder, unspecified ear: Secondary | ICD-10-CM

## 2023-09-17 NOTE — Progress Notes (Signed)
 Dear Dr. Swaziland, Here is my assessment for our mutual patient, [name]. Thank you for allowing me the opportunity to care for your patient. Please do not hesitate to contact me should you have any other questions. Sincerely, Belma Boxer PA-C  Otolaryngology Clinic Note Referring provider: Dr. [place] HPI:  [name] is a 73 y.o. female kindly referred by Dr. [place]and   The patient is a 73 year old female seen in our office for ear related complaints.  The patient notes that over the last year or so she has had fullness in the bilateral ears, she feels like her ears need to pop but will not pop.  She notes intermittent muffled hearing in both of her ears.  She denies any significant pain, she reports some associated high-pitched tonal ringing in the bilateral ears as well.  She notes that when she tries to blow her nose or pop her ear she does hear some cracking and popping but it does not provide any long standing relief.  She denies any significant trauma to the ears other than a reported left-sided ear infection that was described as rather significant.  She denied any preceding history including recurrent episodes of otitis media as a kid.  She notes associated nasal congestion for several years.  She reports that this has been so bad she has had 3 separate surgeries on her nose but she is uncertain as to what surgery they did.  She reports she has intermittent congestion and rhinorrhea from the nose this is a daily occurrence.  She uses a daily Flonase  but does not take any daily antihistamines.  Today she denies any significant congestion or rhinorrhea.  Her surgical history includes a tonsillectomy at the age of 59.  Chart review shows she was seen by ENT in 2021, she does not recall this visit.  At that time she was having tinnitus and aural fullness.  She was referred for audiological evaluation but did not complete it. Independent Review of Additional Tests or Records:   ENT office  visit note 02/27/2020 tinnitus and aural fullness    PMH/Meds/All/SocHx/FamHx/ROS:   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HCC)    Anxiety    Arthritis    Diabetes mellitus without complication (HCC)    GERD (gastroesophageal reflux disease)    Heart murmur    Hyperlipidemia    Hypertension    Neuromuscular disorder (HCC)    Palpitations    Pneumonia    PONV (postoperative nausea and vomiting)    PTSD (post-traumatic stress disorder)    Ringing of ears, left      Past Surgical History:  Procedure Laterality Date   ABDOMINAL HYSTERECTOMY     partial   ACHILLES TENDON SURGERY Left    APPENDECTOMY     BACK SURGERY     BLADDER SUSPENSION     BREAST SURGERY Right 2003   milk duct removed   CARDIAC CATHETERIZATION     CHOLECYSTECTOMY     CORONARY ARTERY BYPASS GRAFT N/A 11/14/2021   Procedure: CORONARY ARTERY BYPASS GRAFTING (CABG) TIMES THREE USING LIMA AND CRYO VEIN.;  Surgeon: Shon Downing, MD;  Location: [facility] OR;  Service: Open Heart Surgery;  Laterality: N/A;   FRACTURE SURGERY     collar bone   LEFT HEART CATH AND CORONARY ANGIOGRAPHY N/A 03/08/2021   Procedure: LEFT HEART CATH AND CORONARY ANGIOGRAPHY;  Surgeon: Sw[place]il[place], Peter M, MD;  Location: [facility] INVASIVE CV LAB;  Service: Cardiovascular;  Laterality: N/A;  NOSE SURGERY     x 2    OTHER SURGICAL HISTORY     Breast Duct removed   PARTIAL HYSTERECTOMY     RECTOCELE REPAIR     TEE WITHOUT CARDIOVERSION N/A 11/14/2021   Procedure: TRANSESOPHAGEAL ECHOCARDIOGRAM (TEE);  Surgeon: Shon Downing, MD;  Location: [facility] OR;  Service: Open Heart Surgery;  Laterality: N/A;   TONSILLECTOMY     VASCULAR SURGERY Right    right leg   VEIN SURGERY Right    leg    Family History  Problem Relation Age of Onset   Diabetes Mother    Alzheimer's disease Mother    Lung cancer Father    Esophageal cancer Brother    Colon cancer Neg Hx      Social Connections: Moderately Isolated (04/13/2023)   Social  Connection and Isolation Panel    Frequency of Communication with Friends and Family: Twice a week    Frequency of Social Gatherings with Friends and Family: Once a week    Attends Religious Services: More than 4 times per year    Active Member of [employer] or Organizations: No    Attends Banker Meetings: Not on file    Marital Status: Widowed      Current Outpatient Medications:    albuterol  (VENTOLIN  HFA) 108 (90 Base) MCG/ACT inhaler, Inhale 2 puffs into the lungs every 6 (six) hours as needed for wheezing or shortness of breath., Disp: 18 g, Rfl: 2   Alcohol Swabs (B-D SINGLE USE SWABS REGULAR) PADS, Use to test blood sugar 3 times daily, Disp: 100 each, Rfl: 3   Blood Glucose Monitoring Suppl (ONETOUCH VERIO FLEX SYSTEM) w/Device KIT, Use to test blood sugars 1-2 times daily., Disp: 1 kit, Rfl: 0   clonazePAM  (KLONOPIN ) 1 MG tablet, Take 1 tablet by mouth twice daily as needed for anxiety, Disp: 60 tablet, Rfl: 3   clopidogrel  (PLAVIX ) 75 MG tablet, Take 1 tablet (75 mg total) by mouth daily., Disp: 90 tablet, Rfl: 3   Dulaglutide  (TRULICITY ) 1.5 MG/0.5ML SOAJ, INJECT 1.5 MG (0.5ML) UNDER THE SKIN ONCE A WEEK, Disp: 6 mL, Rfl: 2   Evolocumab  (REPATHA  SURECLICK) 140 MG/ML SOAJ, Inject 140 mg into the skin every 14 (fourteen) days., Disp: 6 mL, Rfl: 1   ezetimibe  (ZETIA ) 10 MG tablet, Take 1 tablet (10 mg total) by mouth daily., Disp: 90 tablet, Rfl: 3   fluticasone  (FLONASE ) 50 MCG/ACT nasal spray, Place 2 sprays into both nostrils daily., Disp: 16 g, Rfl: 11   furosemide  (LASIX ) 20 MG tablet, Take 1 tablet (20 mg total) by mouth daily as needed., Disp: 30 tablet, Rfl: 6   gabapentin  (NEURONTIN ) 600 MG tablet, TAKE 1 TABLET BY MOUTH EVERY MORNING AND AT NOON AND EVERY EVENING AND AT BEDTIME, Disp: 120 tablet, Rfl: 3   guaiFENesin  (MUCINEX ) 600 MG 12 hr tablet, Take 600 mg by mouth daily., Disp: , Rfl:    HYDROcodone -acetaminophen  (NORCO) 10-325 MG tablet, Take 1 tablet by mouth  every 6 (six) hours as needed., Disp: 120 tablet, Rfl: 0   JARDIANCE  10 MG TABS tablet, Take 1 tablet (10 mg total) by mouth daily., Disp: 90 tablet, Rfl: 3   Lancets (ONETOUCH ULTRASOFT) lancets, Use to test blood sugar 1-2 times daily., Disp: 100 each, Rfl: 12   Melatonin 10 MG CHEW, Chew 2 tablets by mouth at bedtime., Disp: , Rfl:    metoprolol  succinate (TOPROL -XL) 25 MG 24 hr tablet, Take 3 tablets (75 mg total) by mouth  2 (two) times daily., Disp: 180 tablet, Rfl: 3   ondansetron  (ZOFRAN -ODT) 4 MG disintegrating tablet, Take 1 tablet (4 mg total) by mouth daily as needed for nausea or vomiting., Disp: 20 tablet, Rfl: 2   pantoprazole  (PROTONIX ) 40 MG tablet, Take 1 tablet (40 mg total) by mouth daily., Disp: 90 tablet, Rfl: 3   Polyethyl Glycol-Propyl Glycol (SYSTANE) 0.4-0.3 % SOLN, Place 1 drop into both eyes daily as needed (Dry eye)., Disp: , Rfl:    potassium chloride  SA (KLOR-CON  M20) 20 MEQ tablet, Take 1 tablet (20 mEq total) by mouth daily., Disp: 30 tablet, Rfl: 5   QUEtiapine  (SEROQUEL ) 100 MG tablet, TAKE 1/2 (ONE-HALF) TABLET BY MOUTH AT BEDTIME, Disp: 45 tablet, Rfl: 3   rOPINIRole  (REQUIP ) 0.5 MG tablet, Take 1 tablet (0.5 mg total) by mouth at bedtime., Disp: 90 tablet, Rfl: 1   sacubitril -valsartan  (ENTRESTO ) 24-26 MG, Take 1 tablet by mouth 2 (two) times daily., Disp: 60 tablet, Rfl: 11   Physical Exam:   BP 123/77   Pulse 75   Ht 5' 8 (1.727 m)   Wt 162 lb (73.5 kg)   SpO2 95%   BMI 24.63 kg/m   Pertinent Findings  CN II-XII intact Bilateral EAC clear and TM intact with well pneumatized middle ear spaces Weber 512: equal Rinne 512: AC > BC b/l  Anterior rhinoscopy: Septum midline; bilateral inferior turbinates with no hypertrophy, no edema No lesions of oral cavity/oropharynx; dentition in normal limits, absent tonsils No obviously palpable neck masses/lymphadenopathy/thyroid  nodule on the right No respiratory distress or stridor   Seprately Identifiable  Procedures:  None  Impression & Plans:  [name] is a 73 y.o. female with the following   Eustachian tube dysfunction-  Her symptoms are most consistent with eustachian tube dysfunction.  She has a longstanding history of this.  She has been using Flonase  without significant improvement in her symptoms.  She denies any history of seasonal allergies although she has a significant history of nasal congestion.  I do think it reasonable for her to continue using Flonase , I also like her to use Astelin spray.  Given she has never seen an allergist and has no true diagnosis of seasonal allergies I would like her to follow-up with allergist for testing and evaluation.  I like to see her back in the office after that.  She will also complete audiological evaluation prior to the next visit.  Rhinitis-  From what I can gather she has had several endoscopic surgeries on her nose, not sure exactly what surgery she had but she has no significant turbinate hypertrophy.  She is asymptomatic on today's exam.  I do feel allergy testing will help benefit her rhinitis as well.  Thyroid  nodule-  On exam I noted a right thyroid  nodule.  After further discussion the patient notes that she has been told she had this prior but did not recall any imaging.  Chart review shows she did have a thyroid  ultrasound performed approximately 1 year ago.  Results showed a 1.3 cm right inferior nodule and a 1.0 cm left mid nodule.  The recommendations for 1 year follow-up ultrasound.  She will continue outpatient follow-up with her primary care for ongoing imaging.  I am happy to follow her in the future if there are any questions or concerns related to this.   Tinnitus-  Longstanding history of bilateral tonal tinnitus.  She has no alarming findings today.  Discussed noise cancellation, I will also have an  audiogram to review any hearing loss.  - f/u 1 to 62-month follow-up in our clinic with audiological  evaluation    Allergist, Follow up with [place] Astelin  1 month audiology    Thank you for allowing me the opportunity to care for your patient. Please do not hesitate to contact me should you have any other questions.  Sincerely, Belma Boxer PA-C  [facility] Phone: [phone] Fax: [phone]  09/17/2023, 1:55 PM

## 2023-09-22 ENCOUNTER — Telehealth (HOSPITAL_COMMUNITY): Payer: Self-pay

## 2023-09-22 NOTE — Telephone Encounter (Signed)
  ADVANCED [facility] FAILURE [facility]INIC   Pre-operative Risk Assessment   Request for Surgical Clearance    Procedure:  LEFT L5 TFESI  Date of Surgery:  Clearance TBD                               Surgeon:  Dr. Katheryn Pandy  Surgeon's Group or Practice Name:  [facility]  Phone number:  [phone] Fax number:  [phone]  Type of Clearance Requested:   - Medical  - Pharmacy:  Hold Clopidogrel  (Plavix ) would like to know if this can be held 7 days prior to procedure and resume day after  { Type of Anesthesia:  not listed   Additional requests/questions:  Please fax a copy of clearance  to the surgeon's office.  Signed, Maneh Sieben B Carmelina Balducci   09/22/2023, 10:48 AM

## 2023-09-23 ENCOUNTER — Telehealth: Payer: Self-pay

## 2023-09-23 NOTE — Progress Notes (Signed)
   09/23/2023  Patient ID: [name], female   DOB: January 02, 1951, 73 y.o.   MRN: [id number]  Received refill request from [facility] pharmacy for patient's trulicity . Faxed order over for 4 boxes with 1 refill.  Carnell Christian, PharmD Clinical Pharmacist [phone]

## 2023-09-24 DIAGNOSIS — K080 Exfoliation of teeth due to systemic causes: Secondary | ICD-10-CM | POA: Diagnosis not present

## 2023-09-24 NOTE — Telephone Encounter (Signed)
 Copy of clearance with provider recommendations faxed to requesting office via Epic fax function.   Ruddy Corral M, PA-C to Me (Selected Message)    09/22/23 12:19 PM Yes ok to hold Plavix  x 7 days prior to procedure. Resume as soon as safe from neurosurgery standpoint

## 2023-10-14 DIAGNOSIS — H2512 Age-related nuclear cataract, left eye: Secondary | ICD-10-CM | POA: Diagnosis not present

## 2023-10-16 DIAGNOSIS — H25812 Combined forms of age-related cataract, left eye: Secondary | ICD-10-CM | POA: Diagnosis not present

## 2023-10-23 ENCOUNTER — Ambulatory Visit (INDEPENDENT_AMBULATORY_CARE_PROVIDER_SITE_OTHER): Admitting: Audiology

## 2023-10-23 ENCOUNTER — Ambulatory Visit (INDEPENDENT_AMBULATORY_CARE_PROVIDER_SITE_OTHER): Admitting: Physician Assistant

## 2023-10-26 ENCOUNTER — Encounter: Payer: Self-pay | Admitting: Family Medicine

## 2023-10-26 DIAGNOSIS — G2581 Restless legs syndrome: Secondary | ICD-10-CM

## 2023-10-26 MED ORDER — ROPINIROLE HCL 0.5 MG PO TABS: 0.5000 mg | ORAL_TABLET | Freq: Every day | ORAL | 1 refills | Status: DC

## 2023-10-26 NOTE — Progress Notes (Unsigned)
 Subjective:    Patient ID: [name], female    DOB: [birth year], 73 y.o.   MRN: [id number]  HPI: [name] is a 73 y.o. female who returns for follow up appointment for chronic pain and medication refill. states *** pain is located in  ***. rates pain ***. current exercise regime is walking and performing stretching exercises.  [name] Morphine  equivalent is *** MME.   Oral Swab was Performed today.      Pain Inventory Average Pain 9 Pain Right Now 7 My pain is constant, sharp, stabbing, tingling, and aching  In the last 24 hours, has pain interfered with the following? General activity 8 Relation with others 9 Enjoyment of life 9 What TIME of day is your pain at its worst? morning , daytime, evening, and night Sleep (in general) Fair to Poor  Pain is worse with: walking, bending, sitting, inactivity, standing, and some activites Pain improves with: rest, heat/ice, therapy/exercise, pacing activities, medication, and injections Relief from Meds: 10  Family History  Problem Relation Age of Onset   Diabetes Mother    Alzheimer's disease Mother    Lung cancer Father    Esophageal cancer Brother    Colon cancer Neg Hx    Social History   Socioeconomic History   Marital status: Widowed    Spouse name: Not on file   Number of children: 1   Years of education: Not on file   Highest education level: Some college, no degree  Occupational History   Occupation: retired    Comment: work part time  Tobacco Use   Smoking status: Never   Smokeless tobacco: Never   Tobacco comments:    Never smoke 11/01/21  Vaping Use   Vaping status: Never Used  Substance and Sexual Activity   Alcohol use: Never   Drug use: No   Sexual activity: Not on file  Other Topics Concern   Not on file  Social History Narrative   Not on file   Social Drivers of Health   Financial Resource Strain: Medium Risk (04/13/2023)   Overall Financial Resource Strain (CARDIA)     Difficulty of Paying Living Expenses: Somewhat hard  Food Insecurity: Patient Declined (04/13/2023)   Hunger Vital Sign    Worried About Running Out of Food in the Last Year: Patient declined    Ran Out of Food in the Last Year: Patient declined  Transportation Needs: No Transportation Needs (04/13/2023)   PRAPARE - Administrator, Civil Service (Medical): No    Lack of Transportation (Non-Medical): No  Physical Activity: Insufficiently Active (04/13/2023)   Exercise Vital Sign    Days of Exercise per Week: 3 days    Minutes of Exercise per Session: 10 min  Stress: Stress Concern Present (04/13/2023)   [employer] of Occupational Health - Occupational Stress Questionnaire    Feeling of Stress : Rather much  Social Connections: Moderately Isolated (04/13/2023)   Social Connection and Isolation Panel    Frequency of Communication with Friends and Family: Twice a week    Frequency of Social Gatherings with Friends and Family: Once a week    Attends Religious Services: More than 4 times per year    Active Member of [employer] or Organizations: No    Attends Banker Meetings: Not on file    Marital Status: Widowed   Past Surgical History:  Procedure Laterality Date   ABDOMINAL HYSTERECTOMY     partial   ACHILLES  TENDON SURGERY Left    APPENDECTOMY     BACK SURGERY     BLADDER SUSPENSION     BREAST SURGERY Right 2003   milk duct removed   CARDIAC CATHETERIZATION     CHOLECYSTECTOMY     CORONARY ARTERY BYPASS GRAFT N/A 11/14/2021   Procedure: CORONARY ARTERY BYPASS GRAFTING (CABG) TIMES THREE USING LIMA AND CRYO VEIN.;  Surgeon: Obadiah Coy, MD;  Location: [facility] OR;  Service: Open Heart Surgery;  Laterality: N/A;   FRACTURE SURGERY     collar bone   LEFT HEART CATH AND CORONARY ANGIOGRAPHY N/A 03/08/2021   Procedure: LEFT HEART CATH AND CORONARY ANGIOGRAPHY;  Surgeon: Swaziland, Peter M, MD;  Location: [facility] INVASIVE CV LAB;  Service: Cardiovascular;  Laterality: N/A;    NOSE SURGERY     x 2    OTHER SURGICAL HISTORY     Breast Duct removed   PARTIAL HYSTERECTOMY     RECTOCELE REPAIR     TEE WITHOUT CARDIOVERSION N/A 11/14/2021   Procedure: TRANSESOPHAGEAL ECHOCARDIOGRAM (TEE);  Surgeon: Obadiah Coy, MD;  Location: [facility] OR;  Service: Open Heart Surgery;  Laterality: N/A;   TONSILLECTOMY     VASCULAR SURGERY Right    right leg   VEIN SURGERY Right    leg   Past Surgical History:  Procedure Laterality Date   ABDOMINAL HYSTERECTOMY     partial   ACHILLES TENDON SURGERY Left    APPENDECTOMY     BACK SURGERY     BLADDER SUSPENSION     BREAST SURGERY Right 2003   milk duct removed   CARDIAC CATHETERIZATION     CHOLECYSTECTOMY     CORONARY ARTERY BYPASS GRAFT N/A 11/14/2021   Procedure: CORONARY ARTERY BYPASS GRAFTING (CABG) TIMES THREE USING LIMA AND CRYO VEIN.;  Surgeon: Obadiah Coy, MD;  Location: [facility] OR;  Service: Open Heart Surgery;  Laterality: N/A;   FRACTURE SURGERY     collar bone   LEFT HEART CATH AND CORONARY ANGIOGRAPHY N/A 03/08/2021   Procedure: LEFT HEART CATH AND CORONARY ANGIOGRAPHY;  Surgeon: [place], Peter M, MD;  Location: [facility] INVASIVE CV LAB;  Service: Cardiovascular;  Laterality: N/A;   NOSE SURGERY     x 2    OTHER SURGICAL HISTORY     Breast Duct removed   PARTIAL HYSTERECTOMY     RECTOCELE REPAIR     TEE WITHOUT CARDIOVERSION N/A 11/14/2021   Procedure: TRANSESOPHAGEAL ECHOCARDIOGRAM (TEE);  Surgeon: Obadiah Coy, MD;  Location: [facility] OR;  Service: Open Heart Surgery;  Laterality: N/A;   TONSILLECTOMY     VASCULAR SURGERY Right    right leg   VEIN SURGERY Right    leg   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HCC)    Anxiety    Arthritis    Diabetes mellitus without complication (HCC)    GERD (gastroesophageal reflux disease)    Heart murmur    Hyperlipidemia    Hypertension    Neuromuscular disorder (HCC)    Palpitations    Pneumonia    PONV (postoperative nausea and vomiting)    PTSD  (post-traumatic stress disorder)    Ringing of ears, left    There were no vitals taken for this visit.  Opioid Risk Score:   Fall Risk Score:  `1  Depression screen PHQ 2/9     08/25/2023    2:08 PM 06/25/2023    2:00 PM 04/28/2023    2:01 PM 04/28/2023  1:49 PM 01/12/2023    4:26 PM 12/25/2022    3:32 PM 12/01/2022    3:37 PM  Depression screen PHQ 2/9  Decreased Interest 0 0 0 0 0 0 0  Down, Depressed, Hopeless 0 0 0 0 0 0 2  PHQ - 2 Score 0 0 0 0 0 0 2  Altered sleeping 0    0  2  Tired, decreased energy 0    0  2  Change in appetite 0    0  2  Feeling bad or failure about yourself  0    0  0  Trouble concentrating 0    0  1  Moving slowly or fidgety/restless 0    0  0  Suicidal thoughts 0    0  0  PHQ-9 Score 0    0  9  Difficult doing work/chores Not difficult at all    Not difficult at all  Somewhat difficult    Review of Systems  Musculoskeletal:  Positive for back pain and neck pain.       Pain both shoulders, both feet, left leg, left hip  All other systems reviewed and are negative.      Objective:   Physical Exam        Assessment & Plan:  1.Chronic cervicalgia with documented spondylosis on MRI, per Dr. Babs Note and Cervical Radiculitis: Continue Gabapentin ,  Continue HEP as Tolerated. 08/25/2023 2. Left shoulder pain most consistent with left rotator cuff syndrome. Mild DJD.  Continue HEP as tolerated.Continue to monitor. 05/20/20245. Refilled:  Hydrocodone  10/325 mg one tablet every 6 hours as needed for pain. #120. Second script sent for the following month. We will continue the opioid monitoring program, this consists of regular clinic visits, examinations, urine drug screen, pill counts as well as use of [place]  Controlled Substance Reporting system. A 12 month History has been reviewed on the [place]  Controlled Substance Reporting System on 08/25/2023. 3. Left wrist/finger pain most consistent with OA, ?post traumatic. 08/25/2023 4.  Anxiety: Continue Klonopin , PCP prescribing. Continue to monitor. 08/25/2023 5. Restless Leg syndrome: Continue Gabapentin . 08/25/2023 7. Sacroiliac inflammation: S/P Right  SI injection on 09/20/2019, with No relief noted. Ms. Lindenberger seen Dr Addie in regards to her pain. Dr Onetha. Following.  Continue to Monitor. 08/25/2023 8. Myofascial Pain: Continue Robaxin  as needed. 08/25/2023.  9. Bilateral feet with neuropathic pain: Increase Gabapentin  600 mg 4 times a day. Continue to monitor. 08/25/2023. 10.  Herniated Nucleus Pulposus L2-3 with recurrent disc rupture: S/P  Posterior Lumbar Interbody Fusion  - Lumbar two-Lumbar three on 01/13/2020 by Dr Onetha.  Dr Onetha Following. 06/25/2023 11. Left Ankle Pain:No complaints today.  Ortho Following. . Continue to monitor. 08/25/2023  12. Polyarthralgia: Continue HEP as Tolerated. Continue to monitor. 08/25/2023 13. Diabetic Peripheral Neuropathy. Continue Gabapentin . Continue to Monitor. Continue to Monitor.  08/25/2023 14, Fall: subsequent encounter: No Falls this month. Educated in [place] Prevention: She verbalizes understanding.  15. Chronic Right Shoulder Pain: Ortho Following.    F/U in 2 months

## 2023-10-27 ENCOUNTER — Encounter: Attending: Registered Nurse | Admitting: Registered Nurse

## 2023-10-27 ENCOUNTER — Encounter: Payer: Self-pay | Admitting: Registered Nurse

## 2023-10-27 VITALS — BP 136/85 | HR 72 | Ht 68.0 in | Wt 177.0 lb

## 2023-10-27 DIAGNOSIS — M47812 Spondylosis without myelopathy or radiculopathy, cervical region: Secondary | ICD-10-CM | POA: Insufficient documentation

## 2023-10-27 DIAGNOSIS — M545 Low back pain, unspecified: Secondary | ICD-10-CM | POA: Diagnosis not present

## 2023-10-27 DIAGNOSIS — M542 Cervicalgia: Secondary | ICD-10-CM | POA: Insufficient documentation

## 2023-10-27 DIAGNOSIS — Z79891 Long term (current) use of opiate analgesic: Secondary | ICD-10-CM | POA: Diagnosis not present

## 2023-10-27 DIAGNOSIS — M5412 Radiculopathy, cervical region: Secondary | ICD-10-CM | POA: Insufficient documentation

## 2023-10-27 DIAGNOSIS — G8929 Other chronic pain: Secondary | ICD-10-CM | POA: Insufficient documentation

## 2023-10-27 DIAGNOSIS — E1142 Type 2 diabetes mellitus with diabetic polyneuropathy: Secondary | ICD-10-CM | POA: Diagnosis not present

## 2023-10-27 DIAGNOSIS — Z5181 Encounter for therapeutic drug level monitoring: Secondary | ICD-10-CM | POA: Diagnosis not present

## 2023-10-27 DIAGNOSIS — Z794 Long term (current) use of insulin: Secondary | ICD-10-CM

## 2023-10-27 DIAGNOSIS — G894 Chronic pain syndrome: Secondary | ICD-10-CM | POA: Insufficient documentation

## 2023-10-27 MED ORDER — HYDROCODONE-ACETAMINOPHEN 10-325 MG PO TABS: 1.0000 | ORAL_TABLET | Freq: Four times a day (QID) | ORAL | 0 refills | Status: DC | PRN

## 2023-10-27 MED ORDER — HYDROCODONE-ACETAMINOPHEN 10-325 MG PO TABS
1.0000 | ORAL_TABLET | Freq: Four times a day (QID) | ORAL | 0 refills | Status: DC | PRN
Start: 2023-10-27 — End: 2023-10-27

## 2023-10-27 NOTE — Patient Instructions (Addendum)
 Send a My Chart message two weeks after ESI , with update

## 2023-10-31 LAB — DRUG TOX MONITOR 1 W/CONF, ORAL FLD
Alprazolam: NEGATIVE ng/mL (ref <0.50)
Aminoclonazepam: 5.3 ng/mL — ABNORMAL HIGH (ref <0.50)
Amphetamines: NEGATIVE ng/mL (ref <10)
Barbiturates: NEGATIVE ng/mL (ref <10)
Benzodiazepines: POSITIVE ng/mL — AB (ref <0.50)
Buprenorphine: NEGATIVE ng/mL (ref <0.10)
Chlordiazepoxide: NEGATIVE ng/mL (ref <0.50)
Clonazepam: 1.78 ng/mL — ABNORMAL HIGH (ref <0.50)
Cocaine: NEGATIVE ng/mL (ref <5.0)
Codeine: NEGATIVE ng/mL (ref <2.5)
Diazepam: NEGATIVE ng/mL (ref <0.50)
Dihydrocodeine: 4.9 ng/mL — ABNORMAL HIGH (ref <2.5)
Fentanyl: NEGATIVE ng/mL (ref <0.10)
Flunitrazepam: NEGATIVE ng/mL (ref <0.50)
Flurazepam: NEGATIVE ng/mL (ref <0.50)
Heroin Metabolite: NEGATIVE ng/mL (ref <1.0)
Hydrocodone: 58.3 ng/mL — ABNORMAL HIGH (ref <2.5)
Hydromorphone: NEGATIVE ng/mL (ref <2.5)
Lorazepam: NEGATIVE ng/mL (ref <0.50)
MARIJUANA: NEGATIVE ng/mL (ref <2.5)
MDMA: NEGATIVE ng/mL (ref <10)
Meprobamate: NEGATIVE ng/mL (ref <2.5)
Methadone: NEGATIVE ng/mL (ref <5.0)
Midazolam: NEGATIVE ng/mL (ref <0.50)
Morphine: NEGATIVE ng/mL (ref <2.5)
Nicotine Metabolite: NEGATIVE ng/mL (ref <5.0)
Nordiazepam: NEGATIVE ng/mL (ref <0.50)
Norhydrocodone: 5.7 ng/mL — ABNORMAL HIGH (ref <2.5)
Noroxycodone: NEGATIVE ng/mL (ref <2.5)
Opiates: POSITIVE ng/mL — AB (ref <2.5)
Oxazepam: NEGATIVE ng/mL (ref <0.50)
Oxycodone: NEGATIVE ng/mL (ref <2.5)
Oxymorphone: NEGATIVE ng/mL (ref <2.5)
Phencyclidine: NEGATIVE ng/mL (ref <10)
Tapentadol: NEGATIVE ng/mL (ref <5.0)
Temazepam: NEGATIVE ng/mL (ref <0.50)
Tramadol: NEGATIVE ng/mL (ref <5.0)
Triazolam: NEGATIVE ng/mL (ref <0.50)
Zolpidem: NEGATIVE ng/mL (ref <5.0)

## 2023-10-31 LAB — DRUG TOX ALC METAB W/CON, ORAL FLD: Alcohol Metabolite: NEGATIVE ng/mL (ref <25)

## 2023-11-02 DIAGNOSIS — M5416 Radiculopathy, lumbar region: Secondary | ICD-10-CM | POA: Diagnosis not present

## 2023-11-04 ENCOUNTER — Ambulatory Visit (INDEPENDENT_AMBULATORY_CARE_PROVIDER_SITE_OTHER): Admitting: Family Medicine

## 2023-11-04 ENCOUNTER — Encounter: Payer: Self-pay | Admitting: Family Medicine

## 2023-11-04 VITALS — BP 122/80 | HR 76 | Resp 16 | Ht 68.0 in | Wt 175.5 lb

## 2023-11-04 DIAGNOSIS — E1149 Type 2 diabetes mellitus with other diabetic neurological complication: Secondary | ICD-10-CM

## 2023-11-04 DIAGNOSIS — E2839 Other primary ovarian failure: Secondary | ICD-10-CM | POA: Diagnosis not present

## 2023-11-04 DIAGNOSIS — I10 Essential (primary) hypertension: Secondary | ICD-10-CM | POA: Diagnosis not present

## 2023-11-04 DIAGNOSIS — G4700 Insomnia, unspecified: Secondary | ICD-10-CM

## 2023-11-04 DIAGNOSIS — G2581 Restless legs syndrome: Secondary | ICD-10-CM | POA: Diagnosis not present

## 2023-11-04 DIAGNOSIS — F419 Anxiety disorder, unspecified: Secondary | ICD-10-CM

## 2023-11-04 DIAGNOSIS — E7800 Pure hypercholesterolemia, unspecified: Secondary | ICD-10-CM

## 2023-11-04 DIAGNOSIS — Z7984 Long term (current) use of oral hypoglycemic drugs: Secondary | ICD-10-CM

## 2023-11-04 DIAGNOSIS — Z7985 Long-term (current) use of injectable non-insulin antidiabetic drugs: Secondary | ICD-10-CM

## 2023-11-04 DIAGNOSIS — E042 Nontoxic multinodular goiter: Secondary | ICD-10-CM

## 2023-11-04 LAB — POCT GLYCOSYLATED HEMOGLOBIN (HGB A1C): HbA1c, POC (prediabetic range): 5.7 % (ref 5.7–6.4)

## 2023-11-04 LAB — HM DEXA SCAN: HM Dexa Scan: NORMAL

## 2023-11-04 NOTE — Patient Instructions (Addendum)
 A few things to remember from today's visit:  Type 2 diabetes mellitus with neurological complications (HCC) - Plan: POC HgB A1c  Essential hypertension  Anxiety disorder, unspecified type  Insomnia, unspecified type  RLS (restless legs syndrome)  No changes today. When ready for refills for Trulicity  we can change it to 0.75 mg. Fasting labs next visit.  If you need refills for medications you take chronically, please call your pharmacy. Do not use My Chart to request refills or for acute issues that need immediate attention. If you send a my chart message, it may take a few days to be addressed, specially if I am not in the office.  Please be sure medication list is accurate. If a new problem present, please set up appointment sooner than planned today.

## 2023-11-04 NOTE — Progress Notes (Signed)
 Chief Complaint  Patient presents with  . Medical Management of Chronic Issues   Discussed the use of AI scribe software for clinical note transcription with the patient, who gave verbal consent to proceed. History of Present Illness [name] is a 73 year old female who presents for a follow-up visit.  Since her last visit in January, she has been under the care of pain management. She canceled a referral to an allergy doctor due to other commitments. She has seen an ENT specialist who noted a neck mass, which is being monitored by her primary care provider. She underwent cataract surgery in both eyes and is awaiting new glasses in two weeks.  She had a bone density test done recently due to a shoulder fracture in January, and the results are pending. She is scheduled for a hearing test to address her ear ringing and blockage, which she postponed due to her cataract surgery.  She has a history of thyroid  nodules, with a 1.3 cm right inferior nodule and a smaller one on the left side, which is not palpable. An ultrasound of the neck was done in July.  Her blood sugar levels have improved, with an A1c of 5.7, down from 5.9. She is on Trulicity  1.5 mg and Jardiance , and has lost weight, dropping from a size 14 to a size 10.  She experiences lower back pain and left leg neuropathy, for which she had an epidural injection yesterday. She continues to see a podiatrist every two months for her neuropathy.  She has a history of heart issues, with extra beats at 18%, and is on metoprolol  and Jardiance . She sees her cardiologist every six months and reports some improvement. She is also on Plavix  and uses Mucinex  600 mg daily for lung health, along with an albuterol  inhaler as needed.  She takes clonazepam , Seroquel , and Requip  for sleep and leg issues, reporting variable sleep quality, often disrupted by anxiety and nocturia. 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   Still working part-time - 18 hours/ week.   Last seen on 04/14/2023, in the interim has seen Pain Management, Ophthalmology, and ENT.   Hyperthyroidism: ENT noted right-sided neck mass 10/31/2022 Thyroid  US : 1.3 cm right inferior (nodule 2) and 1.0 cm left mid (nodule 5) thyroid  nodules meet criteria for 1 year follow-up ultrasound. Other nodules bilaterally do not meet criteria for biopsy or dedicated follow-up. Lab Results  Component Value Date   TSH 0.86 04/14/2023   Allergic Rhinitis; Hx of Pneumonia managed with Mucinex  600 mg daily and Albuterol  inhaler as needed.   Hx of NSTEMI on Plavix  75 mg daily.   Chronic Systolic Heart Failure managed with Entresto  24-26 mg twice daily.   Hypertension:  Medications: Metoprolol  succinate 75 mg twice daily and Lasix  20 mg daily as needed.  Followed by Cardiologist, Dr. Mclean.  BP readings at home: none mentioned Side effects: none mentioned  Negative for unusual or severe headache, visual changes, exertional chest pain, dyspnea,  focal weakness, or edema. BP Readings from Last 3 Encounters:  11/04/23 122/80  10/27/23 136/85  09/17/23 123/77   Lab Results  Component Value Date   CREATININE 0.90 06/23/2023   BUN 16 06/23/2023   NA 139 06/23/2023   K 3.7 06/23/2023   CL 104 06/23/2023   CO2 24 06/23/2023    Diabetes Mellitus II:  - Checking BG at home: not regularly  - Medications: Trulicity  1.5 mg  injected weekly and Jardiance  10 mg daily.  - eye exam: recently had Bilateral Cataract Surgery, is due to have eye exam for new prescription lenses.  - foot exam: *** Associated Diabetic Neuropathy managed with Gabapentin  600 mg 4x daily in the morning, noon, evening, and at bedtime. Followed by Podiatry q2 months. - Negative for symptoms of hypoglycemia, polyuria, polydipsia, numbness extremities, foot ulcers/trauma  Lab Results  Component Value Date   HGBA1C 5.7 11/04/2023   HGBA1C 5.9 04/14/2023    Lab Results  Component Value Date   MICROALBUR 0.4 01/17/2022    Hyperlipidemia: Currently on Zetia  10 mg daily. Says that she stopped Repatha  due to side effects.  Side effects from medication:*** Lab Results  Component Value Date   CHOL 175 10/02/2022   HDL 47 10/02/2022   LDLCALC 72 10/02/2022   LDLDIRECT 140.0 01/28/2021   TRIG 282 (H) 10/02/2022   CHOLHDL 3.7 10/02/2022   Anxiety/Insomnia managed with Clonazepam  1 mg twice daily as needed, Seroquel  50 mg at bedtime, and Melatonin 20 mg at bedtime. Says that she is sleeping about 6 hours.  Also taking Requip  0.5 mg for Restless Leg Syndrome.    Bone Density today reportedly, results pending.  Followed by [facility] for Lumbar Radiculopathy, last seen 7/28, where she received a Bupivacaine  0.5mg /Dexamethasone , 1 Unit Per 1 mg lumbar epidural.   Review of Systems  Constitutional:  Negative for activity change, appetite change, fatigue, fever and unexpected weight change.  HENT:  Negative for mouth sores, nosebleeds and trouble swallowing.   Eyes:  Negative for redness and visual disturbance.  Respiratory:  Negative for cough, shortness of breath and wheezing.   Cardiovascular:  Negative for chest pain, palpitations and leg swelling.  Gastrointestinal:  Negative for abdominal pain, nausea and vomiting.       Negative for changes in bowel habits.  Genitourinary:  Negative for decreased urine volume, difficulty urinating, dysuria and hematuria.  Neurological:  Negative for seizures, syncope, weakness, numbness and headaches.  Psychiatric/Behavioral: Negative.     See other pertinent positives and negatives in HPI.  Current Outpatient Medications on File Prior to Visit  Medication Sig Dispense Refill  . albuterol  (VENTOLIN  HFA) 108 (90 Base) MCG/ACT inhaler Inhale 2 puffs into the lungs every 6 (six) hours as needed for wheezing or shortness of breath. 18 g 2  . Alcohol Swabs (B-D SINGLE USE SWABS  REGULAR) PADS Use to test blood sugar 3 times daily 100 each 3  . Blood Glucose Monitoring Suppl (ONETOUCH VERIO FLEX SYSTEM) w/Device KIT Use to test blood sugars 1-2 times daily. 1 kit 0  . clonazePAM  (KLONOPIN ) 1 MG tablet Take 1 tablet by mouth twice daily as needed for anxiety 60 tablet 3  . clopidogrel  (PLAVIX ) 75 MG tablet Take 1 tablet (75 mg total) by mouth daily. 90 tablet 3  . Dulaglutide  (TRULICITY ) 1.5 MG/0.5ML SOAJ Inject 1.5 mg into the skin once a week. Getting through PAP    . Evolocumab  (REPATHA  SURECLICK) 140 MG/ML SOAJ Inject 140 mg into the skin every 14 (fourteen) days. 6 mL 1  . ezetimibe  (ZETIA ) 10 MG tablet Take 1 tablet (10 mg total) by mouth daily. 90 tablet 3  . fluticasone  (FLONASE ) 50 MCG/ACT nasal spray Place 2 sprays into both nostrils daily. 16 g 11  . furosemide  (LASIX ) 20 MG tablet Take 1 tablet (20 mg total) by mouth daily as needed. 30 tablet 6  . gabapentin  (NEURONTIN ) 600 MG tablet TAKE 1 TABLET BY  MOUTH EVERY MORNING AND AT NOON AND EVERY EVENING AND AT BEDTIME 120 tablet 3  . guaiFENesin  (MUCINEX ) 600 MG 12 hr tablet Take 600 mg by mouth daily.    . HYDROcodone -acetaminophen  (NORCO) 10-325 MG tablet Take 1 tablet by mouth every 6 (six) hours as needed. 120 tablet 0  . JARDIANCE  10 MG TABS tablet Take 1 tablet (10 mg total) by mouth daily. 90 tablet 3  . Lancets (ONETOUCH ULTRASOFT) lancets Use to test blood sugar 1-2 times daily. 100 each 12  . Melatonin 10 MG CHEW Chew 2 tablets by mouth at bedtime.    . metoprolol  succinate (TOPROL -XL) 25 MG 24 hr tablet Take 3 tablets (75 mg total) by mouth 2 (two) times daily. 180 tablet 3  . ondansetron  (ZOFRAN -ODT) 4 MG disintegrating tablet Take 1 tablet (4 mg total) by mouth daily as needed for nausea or vomiting. 20 tablet 2  . pantoprazole  (PROTONIX ) 40 MG tablet Take 1 tablet (40 mg total) by mouth daily. 90 tablet 3  . Polyethyl Glycol-Propyl Glycol (SYSTANE) 0.4-0.3 % SOLN Place 1 drop into both eyes daily as  needed (Dry eye).    . potassium chloride  SA (KLOR-CON  M20) 20 MEQ tablet Take 1 tablet (20 mEq total) by mouth daily. 30 tablet 5  . QUEtiapine  (SEROQUEL ) 100 MG tablet TAKE 1/2 (ONE-HALF) TABLET BY MOUTH AT BEDTIME 45 tablet 3  . rOPINIRole  (REQUIP ) 0.5 MG tablet Take 1 tablet (0.5 mg total) by mouth at bedtime. 90 tablet 1  . sacubitril -valsartan  (ENTRESTO ) 24-26 MG Take 1 tablet by mouth 2 (two) times daily. 60 tablet 11   No current facility-administered medications on file prior to visit.    Past Medical History:  Diagnosis Date  . 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HCC)   . Anxiety   . Arthritis   . CHF (congestive heart failure) (HCC)   . Diabetes mellitus without complication (HCC)   . GERD (gastroesophageal reflux disease)   . Heart murmur   . Hyperlipidemia   . Hypertension   . Neuromuscular disorder (HCC)   . Palpitations   . Pneumonia   . PONV (postoperative nausea and vomiting)   . PTSD (post-traumatic stress disorder)   . Ringing of ears, left     Allergies  Allergen Reactions  . Hydromorphone  Nausea And Vomiting and Other (See Comments)    Made the patient feel spaced out,  Other Reaction(s): GI Intolerance, Other (See Comments)  Made the patient feel spaced out, also  . Morphine  And Codeine Itching    With high doses  . Statins     Hx intolerance to multiple statins  . Penicillins Rash    Has patient had a PCN reaction causing immediate rash, facial/tongue/throat swelling, SOB or lightheadedness with hypotension: Yes  Has patient had a PCN reaction causing severe rash involving mucus membranes or skin necrosis: No  Has patient had a PCN reaction that required hospitalization: No  Has patient had a PCN reaction occurring within the last 10 years: No  If all of the above answers are NO, then may proceed with Cephalosporin use.  Has patient had a PCN reaction causing immediate rash, facial/tongue/throat swelling, SOB or lightheadedness with hypotension: Yes,  Has patient had a PCN reaction causing severe rash involving mucus membranes or skin necrosis: No, Has patient had a PCN reaction that required hospitalization: No, Has patient had a PCN reaction occurring within the last 10 years: No, If all of the above answers are NO, then may proceed with Cephalosporin  use.    Social History   Socioeconomic History  . Marital status: Widowed    Spouse name: Not on file  . Number of children: 1  . Years of education: Not on file  . Highest education level: Some college, no degree  Occupational History  . Occupation: retired    Comment: work part time  Tobacco Use  . Smoking status: Never  . Smokeless tobacco: Never  . Tobacco comments:    Never smoke 11/01/21  Vaping Use  . Vaping status: Never Used  Substance and Sexual Activity  . Alcohol use: Never  . Drug use: No  . Sexual activity: Not on file  Other Topics Concern  . Not on file  Social History Narrative  . Not on file   Social Drivers of Health   Financial Resource Strain: Medium Risk (04/13/2023)   Overall Financial Resource Strain (CARDIA)   . Difficulty of Paying Living Expenses: Somewhat hard  Food Insecurity: Patient Declined (04/13/2023)   Hunger Vital Sign   . Worried About Programme researcher, broadcasting/film/video in the Last Year: Patient declined   . Ran Out of Food in the Last Year: Patient declined  Transportation Needs: No Transportation Needs (04/13/2023)   PRAPARE - Transportation   . Lack of Transportation (Medical): No   . Lack of Transportation (Non-Medical): No  Physical Activity: Insufficiently Active (04/13/2023)   Exercise Vital Sign   . Days of Exercise per Week: 3 days   . Minutes of Exercise per Session: 10 min  Stress: Stress Concern Present (04/13/2023)   [employer] of Occupational Health - Occupational Stress Questionnaire   . Feeling of Stress : Rather much  Social Connections: Moderately Isolated (04/13/2023)   Social Connection and Isolation Panel   . Frequency of  Communication with Friends and Family: Twice a week   . Frequency of Social Gatherings with Friends and Family: Once a week   . Attends Religious Services: More than 4 times per year   . Active Member of Clubs or Organizations: No   . Attends Banker Meetings: Not on file   . Marital Status: Widowed    Today's Vitals   11/04/23 1509  BP: 122/80  Pulse: 76  Resp: 16  SpO2: 98%  Weight: 175 lb 8 oz (79.6 kg)  Height: 5' 8 (1.727 m)    Body mass index is 26.68 kg/m.  Physical Exam Vitals and nursing note reviewed.  Constitutional:      General: She is not in acute distress.    Appearance: She is well-developed.  HENT:     Head: Normocephalic and atraumatic.     Mouth/Throat:     Mouth: Mucous membranes are moist.     Pharynx: Oropharynx is clear.  Eyes:     Conjunctiva/sclera: Conjunctivae normal.  Neck:     Thyroid : Thyroid  mass present.   Cardiovascular:     Rate and Rhythm: Normal rate. Extrasystoles are present.    Pulses:          Dorsalis pedis pulses are 2+ on the right side and 2+ on the left side.     Heart sounds: No murmur heard. Pulmonary:     Effort: Pulmonary effort is normal. No respiratory distress.     Breath sounds: Normal breath sounds.  Abdominal:     Palpations: Abdomen is soft. There is no hepatomegaly or mass.     Tenderness: There is no abdominal tenderness.  Lymphadenopathy:     Cervical: No cervical  adenopathy.  Skin:    General: Skin is warm.     Findings: No erythema or rash.  Neurological:     General: No focal deficit present.     Mental Status: She is alert and oriented to person, place, and time.     Cranial Nerves: No cranial nerve deficit.     Gait: Gait normal.  Psychiatric:     Comments: Well groomed, good eye contact.   o ASSESSMENT AND PLAN: Ms.Branae Zaliah Wissner was seen here today for chronic disease management.  Orders Placed This Encounter  Procedures  . US  THYROID   . POC HgB A1c     Insomnia Problem has been adequately controlled. Continue Seroquel  100 mg 1/2 tablet daily at bedtime and good sleep hygiene.   Return in about 6 months (around 04/29/2024) for chronic problems.  I,Emily Lagle,acting as a Neurosurgeon for Vicent Febles [place], MD.,have documented all relevant documentation on the behalf of Yenty Bloch [place], MD,as directed by  Arrion Burruel [place], MD while in the presence of Charlies Rayburn [place], MD.  *** (refresh reminder)  I, Jemmie Ledgerwood [place], MD, have reviewed all documentation for this visit. The documentation on 11/04/23 for the exam, diagnosis, procedures, and orders are all accurate and complete. Emily Lagle [facility]. [place] office.

## 2023-11-04 NOTE — Progress Notes (Incomplete)
 Chief Complaint  Patient presents with  . Medical Management of Chronic Issues   Discussed the use of AI scribe software for clinical note transcription with the patient, who gave verbal consent to proceed. History of Present Illness [name] is a 73 year old female with PMHx significant for chronic pain, peripheral neuropathy, anxiety, PVC's, CAD, HFrEF, DM II, hyperthyroidism, and HTN who presents for a follow-up visit.  Since her last visit in January, she has been under the care of pain management. She canceled appt with immunologist, which she requested for persistent ear fullness sensation. She has seen an ENT specialist who noted right-sided neck mass, which is being monitored by US , thyroid  nodule.  Thyroid  US  10/2022:Thyroid  nodules, with a 1.3 cm right inferior nodule and a smaller one on the left side, which is not palpable. An ultrasound of the neck was done in July/2024. Lab Results  Component Value Date   TSH 0.86 04/14/2023    She underwent cataract surgery in both eyes and is awaiting new glasses in two weeks.  She had a bone density test done today, ordered due to a shoulder fracture in January/2025, and the results are pending. She is scheduled for a hearing test to address her bilateral tinnitus and hearing changes; which she postponed due to her cataract surgery.   *** Chronic pain: She experiences lower back pain and left leg neuropathy, for which she had an epidural injection yesterday. She continues to see a podiatrist every two months for her neuropathy.  She has a history of heart issues, with extra beats at 18%, and is on metoprolol  and Jardiance . She sees her cardiologist every six months and reports some improvement. She is also on Plavix  and uses Mucinex  600 mg daily for lung health, along with an albuterol  inhaler as needed.  Anxiety,depression,and insomnia: She takes clonazepam , Seroquel , and Requip  for sleep and leg issues, reporting variable  sleep quality, often disrupted by anxiety and nocturia. 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   Still working part-time - 18 hours/ week.    Allergic Rhinitis; Hx of Pneumonia managed with Mucinex  600 mg daily and Albuterol  inhaler as needed.   Hx of NSTEMI on Plavix  75 mg daily.   Chronic Systolic Heart Failure managed with Entresto  24-26 mg twice daily.   Hypertension:  Medications: Metoprolol  succinate 75 mg twice daily and Lasix  20 mg daily as needed.  Followed by Cardiologist, Dr. Mclean.  BP readings at home: none mentioned Side effects: none mentioned  Negative for unusual or severe headache, visual changes, exertional chest pain, dyspnea,  focal weakness, or edema. BP Readings from Last 3 Encounters:  11/04/23 122/80  10/27/23 136/85  09/17/23 123/77   Lab Results  Component Value Date   CREATININE 0.90 06/23/2023   BUN 16 06/23/2023   NA 139 06/23/2023   K 3.7 06/23/2023   CL 104 06/23/2023   CO2 24 06/23/2023    Diabetes Mellitus II: Diagnosed in 2014  - Checking BG at home: not regularly  - Medications: Trulicity  1.5 mg injected weekly and Jardiance  10 mg daily.  - eye exam: recently had Bilateral Cataract Surgery, is due to have eye exam for new prescription lenses.  - foot exam: 08/2022.   Associated Diabetic Neuropathy managed with Gabapentin  600 mg 4x daily in the morning, noon, evening, and at bedtime. Followed by Podiatry q2 months. - Negative for symptoms of hypoglycemia, polyuria, polydipsia, foot ulcers/trauma  Lab  Results  Component Value Date   HGBA1C 5.7 11/04/2023   HGBA1C 5.9 04/14/2023   Lab Results  Component Value Date   MICROALBUR 0.4 01/17/2022    Hyperlipidemia: Currently on Zetia  10 mg daily. Says that she stopped Repatha  due to side effects.  Side effects from medication:*** Lab Results  Component Value Date   CHOL 175 10/02/2022   HDL 47 10/02/2022   LDLCALC 72 10/02/2022    LDLDIRECT 140.0 01/28/2021   TRIG 282 (H) 10/02/2022   CHOLHDL 3.7 10/02/2022   Anxiety/Insomnia managed with Clonazepam  1 mg twice daily as needed, Seroquel  50 mg at bedtime, and Melatonin 20 mg at bedtime. Says that she is sleeping about 6 hours.  Also taking Requip  0.5 mg for Restless Leg Syndrome.    Bone Density today reportedly, results pending.  Followed by [facility] for Lumbar Radiculopathy, last seen 7/28, where she received a Bupivacaine  0.5mg /Dexamethasone , 1 Unit Per 1 mg lumbar epidural.   Review of Systems  Constitutional:  Negative for activity change, appetite change, fatigue, fever and unexpected weight change.  HENT:  Negative for mouth sores, nosebleeds and trouble swallowing.   Eyes:  Negative for redness and visual disturbance.  Respiratory:  Negative for cough, shortness of breath and wheezing.   Cardiovascular:  Negative for chest pain, palpitations and leg swelling.  Gastrointestinal:  Negative for abdominal pain, nausea and vomiting.       Negative for changes in bowel habits.  Genitourinary:  Negative for decreased urine volume, difficulty urinating, dysuria and hematuria.  Neurological:  Negative for seizures, syncope, weakness, numbness and headaches.  Psychiatric/Behavioral: Negative.     See other pertinent positives and negatives in HPI.  Current Outpatient Medications on File Prior to Visit  Medication Sig Dispense Refill  . albuterol  (VENTOLIN  HFA) 108 (90 Base) MCG/ACT inhaler Inhale 2 puffs into the lungs every 6 (six) hours as needed for wheezing or shortness of breath. 18 g 2  . Alcohol Swabs (B-D SINGLE USE SWABS REGULAR) PADS Use to test blood sugar 3 times daily 100 each 3  . Blood Glucose Monitoring Suppl (ONETOUCH VERIO FLEX SYSTEM) w/Device KIT Use to test blood sugars 1-2 times daily. 1 kit 0  . clonazePAM  (KLONOPIN ) 1 MG tablet Take 1 tablet by mouth twice daily as needed for anxiety 60 tablet 3  . clopidogrel  (PLAVIX )  75 MG tablet Take 1 tablet (75 mg total) by mouth daily. 90 tablet 3  . Dulaglutide  (TRULICITY ) 1.5 MG/0.5ML SOAJ Inject 1.5 mg into the skin once a week. Getting through PAP    . Evolocumab  (REPATHA  SURECLICK) 140 MG/ML SOAJ Inject 140 mg into the skin every 14 (fourteen) days. 6 mL 1  . ezetimibe  (ZETIA ) 10 MG tablet Take 1 tablet (10 mg total) by mouth daily. 90 tablet 3  . fluticasone  (FLONASE ) 50 MCG/ACT nasal spray Place 2 sprays into both nostrils daily. 16 g 11  . furosemide  (LASIX ) 20 MG tablet Take 1 tablet (20 mg total) by mouth daily as needed. 30 tablet 6  . gabapentin  (NEURONTIN ) 600 MG tablet TAKE 1 TABLET BY MOUTH EVERY MORNING AND AT NOON AND EVERY EVENING AND AT BEDTIME 120 tablet 3  . guaiFENesin  (MUCINEX ) 600 MG 12 hr tablet Take 600 mg by mouth daily.    . HYDROcodone -acetaminophen  (NORCO) 10-325 MG tablet Take 1 tablet by mouth every 6 (six) hours as needed. 120 tablet 0  . JARDIANCE  10 MG TABS tablet Take 1 tablet (10 mg total) by mouth  daily. 90 tablet 3  . Lancets (ONETOUCH ULTRASOFT) lancets Use to test blood sugar 1-2 times daily. 100 each 12  . Melatonin 10 MG CHEW Chew 2 tablets by mouth at bedtime.    . metoprolol  succinate (TOPROL -XL) 25 MG 24 hr tablet Take 3 tablets (75 mg total) by mouth 2 (two) times daily. 180 tablet 3  . ondansetron  (ZOFRAN -ODT) 4 MG disintegrating tablet Take 1 tablet (4 mg total) by mouth daily as needed for nausea or vomiting. 20 tablet 2  . pantoprazole  (PROTONIX ) 40 MG tablet Take 1 tablet (40 mg total) by mouth daily. 90 tablet 3  . Polyethyl Glycol-Propyl Glycol (SYSTANE) 0.4-0.3 % SOLN Place 1 drop into both eyes daily as needed (Dry eye).    . potassium chloride  SA (KLOR-CON  M20) 20 MEQ tablet Take 1 tablet (20 mEq total) by mouth daily. 30 tablet 5  . QUEtiapine  (SEROQUEL ) 100 MG tablet TAKE 1/2 (ONE-HALF) TABLET BY MOUTH AT BEDTIME 45 tablet 3  . rOPINIRole  (REQUIP ) 0.5 MG tablet Take 1 tablet (0.5 mg total) by mouth at bedtime. 90  tablet 1  . sacubitril -valsartan  (ENTRESTO ) 24-26 MG Take 1 tablet by mouth 2 (two) times daily. 60 tablet 11   No current facility-administered medications on file prior to visit.    Past Medical History:  Diagnosis Date  . 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HCC)   . Anxiety   . Arthritis   . CHF (congestive heart failure) (HCC)   . Diabetes mellitus without complication (HCC)   . GERD (gastroesophageal reflux disease)   . Heart murmur   . Hyperlipidemia   . Hypertension   . Neuromuscular disorder (HCC)   . Palpitations   . Pneumonia   . PONV (postoperative nausea and vomiting)   . PTSD (post-traumatic stress disorder)   . Ringing of ears, left     Allergies  Allergen Reactions  . Hydromorphone  Nausea And Vomiting and Other (See Comments)    Made the patient feel spaced out,  Other Reaction(s): GI Intolerance, Other (See Comments)  Made the patient feel spaced out, also  . Morphine  And Codeine Itching    With high doses  . Statins     Hx intolerance to multiple statins  . Penicillins Rash    Has patient had a PCN reaction causing immediate rash, facial/tongue/throat swelling, SOB or lightheadedness with hypotension: Yes  Has patient had a PCN reaction causing severe rash involving mucus membranes or skin necrosis: No  Has patient had a PCN reaction that required hospitalization: No  Has patient had a PCN reaction occurring within the last 10 years: No  If all of the above answers are NO, then may proceed with Cephalosporin use.  Has patient had a PCN reaction causing immediate rash, facial/tongue/throat swelling, SOB or lightheadedness with hypotension: Yes, Has patient had a PCN reaction causing severe rash involving mucus membranes or skin necrosis: No, Has patient had a PCN reaction that required hospitalization: No, Has patient had a PCN reaction occurring within the last 10 years: No, If all of the above answers are NO, then may proceed with Cephalosporin use.     Social History   Socioeconomic History  . Marital status: Widowed    Spouse name: Not on file  . Number of children: 1  . Years of education: Not on file  . Highest education level: Some college, no degree  Occupational History  . Occupation: retired    Comment: work part time  Tobacco Use  . Smoking status: Never  .  Smokeless tobacco: Never  . Tobacco comments:    Never smoke 11/01/21  Vaping Use  . Vaping status: Never Used  Substance and Sexual Activity  . Alcohol use: Never  . Drug use: No  . Sexual activity: Not on file  Other Topics Concern  . Not on file  Social History Narrative  . Not on file   Social Drivers of Health   Financial Resource Strain: Medium Risk (04/13/2023)   Overall Financial Resource Strain (CARDIA)   . Difficulty of Paying Living Expenses: Somewhat hard  Food Insecurity: Patient Declined (04/13/2023)   Hunger Vital Sign   . Worried About Programme researcher, broadcasting/film/video in the Last Year: Patient declined   . Ran Out of Food in the Last Year: Patient declined  Transportation Needs: No Transportation Needs (04/13/2023)   PRAPARE - Transportation   . Lack of Transportation (Medical): No   . Lack of Transportation (Non-Medical): No  Physical Activity: Insufficiently Active (04/13/2023)   Exercise Vital Sign   . Days of Exercise per Week: 3 days   . Minutes of Exercise per Session: 10 min  Stress: Stress Concern Present (04/13/2023)   [employer] of Occupational Health - Occupational Stress Questionnaire   . Feeling of Stress : Rather much  Social Connections: Moderately Isolated (04/13/2023)   Social Connection and Isolation Panel   . Frequency of Communication with Friends and Family: Twice a week   . Frequency of Social Gatherings with Friends and Family: Once a week   . Attends Religious Services: More than 4 times per year   . Active Member of Clubs or Organizations: No   . Attends Banker Meetings: Not on file   . Marital Status:  Widowed    Today's Vitals   11/04/23 1509  BP: 122/80  Pulse: 76  Resp: 16  SpO2: 98%  Weight: 175 lb 8 oz (79.6 kg)  Height: 5' 8 (1.727 m)    Body mass index is 26.68 kg/m.  Physical Exam Vitals and nursing note reviewed.  Constitutional:      General: She is not in acute distress.    Appearance: She is well-developed.  HENT:     Head: Normocephalic and atraumatic.     Mouth/Throat:     Mouth: Mucous membranes are moist.     Pharynx: Oropharynx is clear.  Eyes:     Conjunctiva/sclera: Conjunctivae normal.  Neck:     Thyroid : Thyroid  mass present.   Cardiovascular:     Rate and Rhythm: Normal rate. Extrasystoles are present.    Pulses:          Dorsalis pedis pulses are 2+ on the right side and 2+ on the left side.     Heart sounds: No murmur heard. Pulmonary:     Effort: Pulmonary effort is normal. No respiratory distress.     Breath sounds: Normal breath sounds.  Abdominal:     Palpations: Abdomen is soft. There is no hepatomegaly or mass.     Tenderness: There is no abdominal tenderness.  Lymphadenopathy:     Cervical: No cervical adenopathy.  Skin:    General: Skin is warm.     Findings: No erythema or rash.  Neurological:     General: No focal deficit present.     Mental Status: She is alert and oriented to person, place, and time.     Cranial Nerves: No cranial nerve deficit.     Gait: Gait normal.  Psychiatric:     Comments:  Well groomed, good eye contact.   o ASSESSMENT AND PLAN: [name] was seen here today for chronic disease management.  Orders Placed This Encounter  Procedures  . US  THYROID   . POC HgB A1c    Insomnia Problem has been adequately controlled. Continue Seroquel  100 mg 1/2 tablet daily at bedtime and good sleep hygiene.   Return in about 6 months (around 04/29/2024) for chronic problems.  I,Emily Lagle,acting as a Neurosurgeon for Keston Seever [place], MD.,have documented all relevant documentation on the behalf of Jeanette Croke  [place], MD,as directed by  Jeanette Stockley [place], MD while in the presence of Jeanette Sandy [place], MD.  *** (refresh reminder)  I, Caeden Foots [place], MD, have reviewed all documentation for this visit. The documentation on 11/04/23 for the exam, diagnosis, procedures, and orders are all accurate and complete. Emily Lagle [facility]. [place] office.

## 2023-11-04 NOTE — Assessment & Plan Note (Signed)
 Problem has been adequately controlled. Continue Seroquel 100 mg 1/2 tablet daily at bedtime and good sleep hygiene.

## 2023-11-05 DIAGNOSIS — E042 Nontoxic multinodular goiter: Secondary | ICD-10-CM | POA: Insufficient documentation

## 2023-11-05 NOTE — Assessment & Plan Note (Signed)
 Problem is well controlled, HgA1C went from 5.9 to 5.7. For now continue Jardiance  10 mg and Trulicity  1.5 mg weekly. When she is ready for Trulicity  refill, we can try lower dose, 0.75 mg weekly. Annual eye exam, periodic dental and foot care to continue. F/U in 5-6 months.

## 2023-11-05 NOTE — Assessment & Plan Note (Signed)
 Last thyroid  US  in 10/2022, recommended annual follow up. Thyroid  US  order placed. TSH next visit.

## 2023-11-05 NOTE — Assessment & Plan Note (Signed)
 Problem is stable. Continue clonazepam  1 mg twice daily as needed. PDMP reviewed. Follow-up in 5 to 6 months, before if needed.

## 2023-11-05 NOTE — Assessment & Plan Note (Signed)
 Currently she is on Zetia  10 mg daily. She has not tolerated statins in the past and recently tried She stopped Repatha  due to side effects. Last LDL 72 in 09/2022. Prefers to hold on FLP.

## 2023-11-05 NOTE — Assessment & Plan Note (Signed)
 Problem is well controlled. Continue Requip  0.5 mg daily at bedtime.

## 2023-11-05 NOTE — Assessment & Plan Note (Signed)
 BP adequately controlled. Continue metoprolol -succinate 25 mg 2 tabs bid and low-salt diet. She is also on Entresto  for HFrEF. Eye exam is current.

## 2023-11-06 ENCOUNTER — Encounter: Payer: Self-pay | Admitting: Family Medicine

## 2023-11-06 NOTE — Addendum Note (Signed)
 Addended by: EVELINE LAURAINE BRAVO on: 11/06/2023 09:25 AM   Modules accepted: Orders

## 2023-11-13 ENCOUNTER — Encounter (HOSPITAL_COMMUNITY): Payer: Self-pay

## 2023-11-13 ENCOUNTER — Encounter: Payer: Self-pay | Admitting: Family Medicine

## 2023-11-13 ENCOUNTER — Ambulatory Visit (HOSPITAL_COMMUNITY)

## 2023-11-13 DIAGNOSIS — F32A Depression, unspecified: Secondary | ICD-10-CM

## 2023-11-13 MED ORDER — CLONAZEPAM 1 MG PO TABS: 1.0000 mg | ORAL_TABLET | Freq: Two times a day (BID) | ORAL | 3 refills | Status: DC | PRN

## 2023-11-14 ENCOUNTER — Other Ambulatory Visit: Payer: Self-pay | Admitting: Cardiology

## 2023-11-14 DIAGNOSIS — I2510 Atherosclerotic heart disease of native coronary artery without angina pectoris: Secondary | ICD-10-CM

## 2023-11-14 DIAGNOSIS — I214 Non-ST elevation (NSTEMI) myocardial infarction: Secondary | ICD-10-CM

## 2023-11-14 DIAGNOSIS — E785 Hyperlipidemia, unspecified: Secondary | ICD-10-CM

## 2023-11-14 DIAGNOSIS — I700 Atherosclerosis of aorta: Secondary | ICD-10-CM

## 2023-11-16 ENCOUNTER — Encounter (HOSPITAL_COMMUNITY): Payer: Self-pay | Admitting: Cardiology

## 2023-11-17 DIAGNOSIS — H353131 Nonexudative age-related macular degeneration, bilateral, early dry stage: Secondary | ICD-10-CM | POA: Diagnosis not present

## 2023-11-29 ENCOUNTER — Other Ambulatory Visit: Payer: Self-pay | Admitting: Family Medicine

## 2023-11-29 DIAGNOSIS — K2100 Gastro-esophageal reflux disease with esophagitis, without bleeding: Secondary | ICD-10-CM

## 2023-12-01 DIAGNOSIS — M25552 Pain in left hip: Secondary | ICD-10-CM | POA: Diagnosis not present

## 2023-12-03 ENCOUNTER — Ambulatory Visit (HOSPITAL_COMMUNITY)
Admission: RE | Admit: 2023-12-03 | Discharge: 2023-12-03 | Disposition: A | Discharge status: Home / Self Care | Source: Ambulatory Visit | Attending: Family Medicine | Admitting: Family Medicine

## 2023-12-03 DIAGNOSIS — M25552 Pain in left hip: Secondary | ICD-10-CM | POA: Diagnosis not present

## 2023-12-03 DIAGNOSIS — E042 Nontoxic multinodular goiter: Secondary | ICD-10-CM | POA: Insufficient documentation

## 2023-12-09 ENCOUNTER — Ambulatory Visit: Admitting: Physical Medicine & Rehabilitation

## 2023-12-10 ENCOUNTER — Encounter: Admitting: Orthopaedic Surgery

## 2023-12-16 ENCOUNTER — Encounter: Payer: Self-pay | Admitting: Family Medicine

## 2023-12-16 ENCOUNTER — Encounter (HOSPITAL_COMMUNITY): Payer: Self-pay | Admitting: Cardiology

## 2023-12-18 ENCOUNTER — Ambulatory Visit: Payer: Self-pay | Admitting: Family Medicine

## 2023-12-18 ENCOUNTER — Telehealth: Payer: Self-pay

## 2023-12-18 NOTE — Telephone Encounter (Signed)
   Pre-operative Risk Assessment    Patient Name: [name]  DOB: [birth year] MRN: [id number]   Date of last office visit: 01/15/23 Jeanette BIRMINGHAM, MD Date of next office visit: NONE   Request for Surgical Clearance    Procedure:  LEFT TOTAL HIP ARTHROPLASTY  Date of Surgery:  Clearance TBD                                Surgeon:  TORIBIO HIGASHI, MD Surgeon's Group or Practice Name:  [facility] Phone number:  [phone]   [address] Fax number:  [phone]   ATTN: KELLY HIGH   Type of Clearance Requested:   - Medical  - Pharmacy:  Hold Clopidogrel  (Plavix ) 7 DAY HOLD   Type of Anesthesia:  Spinal   Additional requests/questions:    SignedLucie DELENA Ku   12/18/2023, 7:50 AM

## 2023-12-18 NOTE — Telephone Encounter (Signed)
Called patient, NA, no VM to leave a message.

## 2023-12-18 NOTE — Telephone Encounter (Signed)
   Name: [name]  DOB: 1950/12/13  MRN: [id number]  Primary Cardiologist: Vina Gull, MD   Preoperative team, please contact this patient and set up a phone call appointment for further preoperative risk assessment. Please obtain consent and complete medication review. Thank you for your help.  I confirm that guidance regarding antiplatelet and oral anticoagulation therapy has been completed and, if necessary, noted below:  Per office protocol, if patient is without any new symptoms or concerns at the time of their virtual visit, she may hold Plavix  for 5 days prior to procedure. Please resume Plavix  as soon as possible postprocedure, at the discretion of the surgeon.    I also confirmed the patient resides in the state of [place] . As per [facility] Medical Board telemedicine laws, the patient must reside in the state in which the provider is licensed.   Lamarr Satterfield, NP 12/18/2023, 8:06 AM [facility]

## 2023-12-21 ENCOUNTER — Other Ambulatory Visit (HOSPITAL_COMMUNITY): Payer: Self-pay | Admitting: Cardiology

## 2023-12-21 NOTE — Telephone Encounter (Signed)
Please set up for VV

## 2023-12-21 NOTE — Telephone Encounter (Signed)
   Name: [name]  DOB: [birth year]  MRN: [id number]  Primary Cardiologist: Vina Gull, MD  Chart reviewed as part of pre-operative protocol coverage. Because of [name]'s past medical history and time since last visit, she will require a follow-up in-office visit in order to better assess preoperative cardiovascular risk.  Pre-op covering staff: - Please schedule appointment and call patient to inform them. If patient already had an upcoming appointment within acceptable timeframe, please add pre-op clearance to the appointment notes so provider is aware. - Please contact requesting surgeon's office via preferred method (i.e, phone, fax) to inform them of need for appointment prior to surgery.   ONLY SCHEDULE WITH [facility].    [name] [name], PA  12/21/2023, 2:48 PM

## 2023-12-21 NOTE — Telephone Encounter (Signed)
 If she is symptomatically unchanged, she should be stable for hip surgery.  However, she is due for regular followup.  Can you get her in office for her regular appointment?

## 2023-12-21 NOTE — Telephone Encounter (Signed)
 Called patient to schedule telehealth appointment for cardiac clearance, but she is insisting that she wants [facility] to call her about doing clearance and will not schedule with [facility].

## 2023-12-21 NOTE — Telephone Encounter (Signed)
 Patient stated that she is with [facility]. Do we schedule the preop telehealth appointment. Please advise.

## 2023-12-25 ENCOUNTER — Other Ambulatory Visit: Payer: Self-pay | Admitting: Family Medicine

## 2023-12-25 ENCOUNTER — Encounter: Payer: Self-pay | Admitting: Family Medicine

## 2023-12-25 DIAGNOSIS — J989 Respiratory disorder, unspecified: Secondary | ICD-10-CM

## 2023-12-29 ENCOUNTER — Encounter: Attending: Registered Nurse | Admitting: Registered Nurse

## 2023-12-29 ENCOUNTER — Encounter: Payer: Self-pay | Admitting: Registered Nurse

## 2023-12-29 VITALS — BP 135/76 | HR 75 | Ht 68.0 in | Wt 178.2 lb

## 2023-12-29 DIAGNOSIS — M5412 Radiculopathy, cervical region: Secondary | ICD-10-CM | POA: Diagnosis not present

## 2023-12-29 DIAGNOSIS — Z79891 Long term (current) use of opiate analgesic: Secondary | ICD-10-CM | POA: Diagnosis not present

## 2023-12-29 DIAGNOSIS — M47812 Spondylosis without myelopathy or radiculopathy, cervical region: Secondary | ICD-10-CM | POA: Insufficient documentation

## 2023-12-29 DIAGNOSIS — G8929 Other chronic pain: Secondary | ICD-10-CM | POA: Insufficient documentation

## 2023-12-29 DIAGNOSIS — Z5181 Encounter for therapeutic drug level monitoring: Secondary | ICD-10-CM | POA: Insufficient documentation

## 2023-12-29 DIAGNOSIS — M545 Low back pain, unspecified: Secondary | ICD-10-CM | POA: Insufficient documentation

## 2023-12-29 DIAGNOSIS — G894 Chronic pain syndrome: Secondary | ICD-10-CM | POA: Diagnosis not present

## 2023-12-29 DIAGNOSIS — M25551 Pain in right hip: Secondary | ICD-10-CM | POA: Insufficient documentation

## 2023-12-29 DIAGNOSIS — G5793 Unspecified mononeuropathy of bilateral lower limbs: Secondary | ICD-10-CM | POA: Diagnosis not present

## 2023-12-29 DIAGNOSIS — M542 Cervicalgia: Secondary | ICD-10-CM | POA: Insufficient documentation

## 2023-12-29 DIAGNOSIS — M25552 Pain in left hip: Secondary | ICD-10-CM | POA: Diagnosis not present

## 2023-12-29 MED ORDER — HYDROCODONE-ACETAMINOPHEN 10-325 MG PO TABS: 1.0000 | ORAL_TABLET | Freq: Four times a day (QID) | ORAL | 0 refills | Status: DC | PRN

## 2023-12-29 NOTE — Progress Notes (Signed)
 Subjective:    Patient ID: [name], female    DOB: 1950-08-07, 73 y.o.   MRN: [id number]  HPI: [name] is a 73 y.o. female who returns for follow up appointment for chronic pain and medication refill. She states her pain is located in her neck radiating into her left shoulder, lower back and bilateral hips L>R.Also reports bilateral feet with tingling and burning.  She  rates her pain 9. Her current exercise regime is walking short distances and performing stretching exercises.  [name] Morphine  equivalent is 40.00 MME. She is also prescribed Clonazepam  by Dr. Swaziland .We have discussed the black box warning of using opioids and benzodiazepines. I highlighted the dangers of using these drugs together and discussed the adverse events including respiratory suppression, overdose, cognitive impairment and importance of compliance with current regimen. We will continue to monitor and adjust as indicated.    Last Oral Swab was Performed on 10/27/2023, it was consistent.      Pain Inventory Average Pain 9 Pain Right Now 9 My pain is sharp, dull, stabbing, tingling, and aching  In the last 24 hours, has pain interfered with the following? General activity 8 Relation with others 8 Enjoyment of life 8 What TIME of day is your pain at its worst? morning , daytime, evening, and night Sleep (in general) Fair  Pain is worse with: walking, bending, sitting, inactivity, standing, and some activites Pain improves with: rest, pacing activities, and medication Relief from Meds: 10  Family History  Problem Relation Age of Onset   Diabetes Mother    Alzheimer's disease Mother    Heart disease Mother    Lung cancer Father    Esophageal cancer Brother    Colon cancer Neg Hx    Social History   Socioeconomic History   Marital status: Widowed    Spouse name: Not on file   Number of children: 1   Years of education: Not on file   Highest education level: Some college, no  degree  Occupational History   Occupation: retired    Comment: work part time  Tobacco Use   Smoking status: Never   Smokeless tobacco: Never   Tobacco comments:    Never smoke 11/01/21  Vaping Use   Vaping status: Never Used  Substance and Sexual Activity   Alcohol use: Never   Drug use: No   Sexual activity: Not on file  Other Topics Concern   Not on file  Social History Narrative   Not on file   Social Drivers of Health   Financial Resource Strain: Medium Risk (04/13/2023)   Overall Financial Resource Strain (CARDIA)    Difficulty of Paying Living Expenses: Somewhat hard  Food Insecurity: Patient Declined (04/13/2023)   Hunger Vital Sign    Worried About Running Out of Food in the Last Year: Patient declined    Ran Out of Food in the Last Year: Patient declined  Transportation Needs: No Transportation Needs (04/13/2023)   PRAPARE - Administrator, Civil Service (Medical): No    Lack of Transportation (Non-Medical): No  Physical Activity: Insufficiently Active (04/13/2023)   Exercise Vital Sign    Days of Exercise per Week: 3 days    Minutes of Exercise per Session: 10 min  Stress: Stress Concern Present (04/13/2023)   [employer] of Occupational Health - Occupational Stress Questionnaire    Feeling of Stress : Rather much  Social Connections: Moderately Isolated (04/13/2023)   Social Connection and  Isolation Panel    Frequency of Communication with Friends and Family: Twice a week    Frequency of Social Gatherings with Friends and Family: Once a week    Attends Religious Services: More than 4 times per year    Active Member of [employer] or Organizations: No    Attends Banker Meetings: Not on file    Marital Status: Widowed   Past Surgical History:  Procedure Laterality Date   ABDOMINAL HYSTERECTOMY     partial   ACHILLES TENDON SURGERY Left    APPENDECTOMY     BACK SURGERY     BLADDER SUSPENSION     BREAST SURGERY Right 2003   milk duct  removed   CARDIAC CATHETERIZATION     CHOLECYSTECTOMY     CORONARY ARTERY BYPASS GRAFT N/A 11/14/2021   Procedure: CORONARY ARTERY BYPASS GRAFTING (CABG) TIMES THREE USING LIMA AND CRYO VEIN.;  Surgeon: Obadiah Coy, MD;  Location: [facility] OR;  Service: Open Heart Surgery;  Laterality: N/A;   FRACTURE SURGERY     collar bone   LEFT HEART CATH AND CORONARY ANGIOGRAPHY N/A 03/08/2021   Procedure: LEFT HEART CATH AND CORONARY ANGIOGRAPHY;  Surgeon: Swaziland, Peter M, MD;  Location: [facility] INVASIVE CV LAB;  Service: Cardiovascular;  Laterality: N/A;   NOSE SURGERY     x 2    OTHER SURGICAL HISTORY     Breast Duct removed   PARTIAL HYSTERECTOMY     RECTOCELE REPAIR     TEE WITHOUT CARDIOVERSION N/A 11/14/2021   Procedure: TRANSESOPHAGEAL ECHOCARDIOGRAM (TEE);  Surgeon: Obadiah Coy, MD;  Location: [facility] OR;  Service: Open Heart Surgery;  Laterality: N/A;   TONSILLECTOMY     VASCULAR SURGERY Right    right leg   VEIN SURGERY Right    leg   Past Surgical History:  Procedure Laterality Date   ABDOMINAL HYSTERECTOMY     partial   ACHILLES TENDON SURGERY Left    APPENDECTOMY     BACK SURGERY     BLADDER SUSPENSION     BREAST SURGERY Right 2003   milk duct removed   CARDIAC CATHETERIZATION     CHOLECYSTECTOMY     CORONARY ARTERY BYPASS GRAFT N/A 11/14/2021   Procedure: CORONARY ARTERY BYPASS GRAFTING (CABG) TIMES THREE USING LIMA AND CRYO VEIN.;  Surgeon: Obadiah Coy, MD;  Location: [facility] OR;  Service: Open Heart Surgery;  Laterality: N/A;   FRACTURE SURGERY     collar bone   LEFT HEART CATH AND CORONARY ANGIOGRAPHY N/A 03/08/2021   Procedure: LEFT HEART CATH AND CORONARY ANGIOGRAPHY;  Surgeon: Swaziland, Peter M, MD;  Location: [facility] INVASIVE CV LAB;  Service: Cardiovascular;  Laterality: N/A;   NOSE SURGERY     x 2    OTHER SURGICAL HISTORY     Breast Duct removed   PARTIAL HYSTERECTOMY     RECTOCELE REPAIR     TEE WITHOUT CARDIOVERSION N/A 11/14/2021   Procedure: TRANSESOPHAGEAL ECHOCARDIOGRAM  (TEE);  Surgeon: Obadiah Coy, MD;  Location: [facility] OR;  Service: Open Heart Surgery;  Laterality: N/A;   TONSILLECTOMY     VASCULAR SURGERY Right    right leg   VEIN SURGERY Right    leg   Past Medical History:  Diagnosis Date   Adrenal gland cyst    Anxiety    Arthritis    CHF (congestive heart failure) (HCC)    Diabetes mellitus without complication (HCC)    GERD (gastroesophageal reflux disease)    Heart murmur  Hyperlipidemia    Hypertension    Neuromuscular disorder (HCC)    Palpitations    Pneumonia    PONV (postoperative nausea and vomiting)    PTSD (post-traumatic stress disorder)    Ringing of ears, left    BP 135/76   Pulse 75   Ht 5' 8 (1.727 m)   Wt 178 lb 3.2 oz (80.8 kg)   SpO2 99%   BMI 27.10 kg/m   Opioid Risk Score:   Fall Risk Score:  `1  Depression screen PHQ 2/9     12/29/2023    2:13 PM 10/27/2023    2:08 PM 08/25/2023    2:08 PM 06/25/2023    2:00 PM 04/28/2023    2:01 PM 04/28/2023    1:49 PM 01/12/2023    4:26 PM  Depression screen PHQ 2/9  Decreased Interest 1 1 0 0 0 0 0  Down, Depressed, Hopeless 1 1 0 0 0 0 0  PHQ - 2 Score 2 2 0 0 0 0 0  Altered sleeping   0    0  Tired, decreased energy   0    0  Change in appetite   0    0  Feeling bad or failure about yourself    0    0  Trouble concentrating   0    0  Moving slowly or fidgety/restless   0    0  Suicidal thoughts   0    0  PHQ-9 Score   0    0  Difficult doing work/chores   Not difficult at all    Not difficult at all    Review of Systems  Musculoskeletal:  Positive for back pain.       Bil hip feet left shoulder and leg  Psychiatric/Behavioral:  Positive for dysphoric mood.   All other systems reviewed and are negative.      Objective:   Physical Exam Vitals and nursing note reviewed.  Constitutional:      Appearance: Normal appearance.  Neck:     Comments: Cervical Paraspinal Tenderness: C-5-C-6 Mainly Left Side  Cardiovascular:     Rate and Rhythm: Normal  rate and regular rhythm.     Pulses: Normal pulses.     Heart sounds: Normal heart sounds.  Pulmonary:     Effort: Pulmonary effort is normal.     Breath sounds: Normal breath sounds.  Musculoskeletal:     Comments: Normal Muscle Bulk and Muscle Testing Reveals:  Upper Extremities: Full ROM and Muscle Strength  5/5 Left AC Joint Tenderness Thoracic Paraspinal Tenderness: T-1-T-7 Mainly Left Side   Lumbar Hypersensitivity Left Greater Trochanter Tenderness Lower Extremities: Right: Full ROM and Muscle Strength 5/5 Left Lower Extremity: Decreased ROM and Muscle Strength 5/5 Left Lower Extremity Flexion Produces Pain into her Left Hip Arises from Chair slowly Antalgic  Gait     Skin:    General: Skin is warm and dry.  Neurological:     Mental Status: She is alert and oriented to person, place, and time.  Psychiatric:        Mood and Affect: Mood normal.        Behavior: Behavior normal.          Assessment & Plan:  1.Chronic cervicalgia with documented spondylosis on MRI, per Dr. Babs Note and Cervical Radiculitis: Continue Gabapentin ,  Continue HEP as Tolerated. 12/29/2023 2. Left shoulder pain most consistent with left rotator cuff syndrome. Mild DJD.  Continue HEP as  tolerated.Continue to monitor. 09/23/20245. Refilled:  Hydrocodone  10/325 mg one tablet every 6 hours as needed for pain. #120. Second script sent for the following month. We will continue the opioid monitoring program, this consists of regular clinic visits, examinations, urine drug screen, pill counts as well as use of [place]  Controlled Substance Reporting system. A 12 month History has been reviewed on the [place]  Controlled Substance Reporting System on 12/29/2023. 3. Left wrist/finger pain most consistent with OA, ?post traumatic. 12/29/2023 4. Anxiety: Continue Klonopin , PCP prescribing. Continue to monitor. 12/29/2023 5. Restless Leg syndrome: Continue Gabapentin . 12/29/2023 7. Sacroiliac  inflammation: S/P Right  SI injection on 09/20/2019, with No relief noted. Ms. Laidlaw seen Dr Addie in regards to her pain. Dr Onetha. Following.  Continue to Monitor. 12/29/2023 8. Myofascial Pain: Continue Robaxin  as needed. 12/29/2023.  9. Bilateral feet with neuropathic pain: Increase Gabapentin  600 mg 4 times a day. Continue to monitor. 12/29/2023. 10.  Herniated Nucleus Pulposus L2-3 with recurrent disc rupture: S/P  Posterior Lumbar Interbody Fusion  - Lumbar two-Lumbar three on 01/13/2020 by Dr Onetha.  Dr Onetha Following. 12/29/2023 11. Left Ankle Pain:No complaints today.  Ortho Following. . Continue to monitor. 12/29/2023  12. Polyarthralgia: Continue HEP as Tolerated. Continue to monitor. 12/29/2023 13. Diabetic Peripheral Neuropathy. Continue Gabapentin . Continue to Monitor. Continue to Monitor.  12/29/2023 14, Fall: subsequent encounter: No Falls this month. Educated in [place] Prevention: She verbalizes understanding. 12/29/2023 15. Chronic Right Shoulder Pain: Ortho Following. 12/29/2023 16. Chronic Low Back Pain: Neurosurgery following.  Continue to Monitor.  17. Left Hip Pain: Awaiting on a surgical date, she will keep this provider updated.  F/U in 2 months

## 2024-01-01 ENCOUNTER — Other Ambulatory Visit: Payer: Self-pay | Admitting: Family Medicine

## 2024-01-01 DIAGNOSIS — E876 Hypokalemia: Secondary | ICD-10-CM

## 2024-01-08 ENCOUNTER — Other Ambulatory Visit (HOSPITAL_COMMUNITY): Payer: Self-pay | Admitting: Cardiology

## 2024-01-08 DIAGNOSIS — I5022 Chronic systolic (congestive) heart failure: Secondary | ICD-10-CM

## 2024-01-08 DIAGNOSIS — E1149 Type 2 diabetes mellitus with other diabetic neurological complication: Secondary | ICD-10-CM

## 2024-01-11 ENCOUNTER — Encounter: Payer: Self-pay | Admitting: Family Medicine

## 2024-01-11 ENCOUNTER — Other Ambulatory Visit: Payer: Self-pay

## 2024-01-11 DIAGNOSIS — F3341 Major depressive disorder, recurrent, in partial remission: Secondary | ICD-10-CM

## 2024-01-11 DIAGNOSIS — G4700 Insomnia, unspecified: Secondary | ICD-10-CM

## 2024-01-11 MED ORDER — QUETIAPINE FUMARATE 100 MG PO TABS: ORAL_TABLET | ORAL | 3 refills | Status: AC

## 2024-01-13 ENCOUNTER — Ambulatory Visit (HOSPITAL_COMMUNITY)
Admission: RE | Admit: 2024-01-13 | Discharge: 2024-01-13 | Disposition: A | Discharge status: Home / Self Care | Source: Ambulatory Visit | Attending: Cardiology | Admitting: Cardiology

## 2024-01-13 ENCOUNTER — Encounter (HOSPITAL_COMMUNITY): Payer: Self-pay | Admitting: Cardiology

## 2024-01-13 ENCOUNTER — Ambulatory Visit (HOSPITAL_COMMUNITY): Payer: Self-pay | Admitting: Cardiology

## 2024-01-13 ENCOUNTER — Telehealth (HOSPITAL_COMMUNITY): Payer: Self-pay

## 2024-01-13 VITALS — BP 140/80 | HR 78 | Wt 178.4 lb

## 2024-01-13 DIAGNOSIS — M25552 Pain in left hip: Secondary | ICD-10-CM | POA: Diagnosis not present

## 2024-01-13 DIAGNOSIS — I5022 Chronic systolic (congestive) heart failure: Secondary | ICD-10-CM

## 2024-01-13 DIAGNOSIS — I255 Ischemic cardiomyopathy: Secondary | ICD-10-CM | POA: Diagnosis not present

## 2024-01-13 DIAGNOSIS — F411 Generalized anxiety disorder: Secondary | ICD-10-CM | POA: Insufficient documentation

## 2024-01-13 DIAGNOSIS — E119 Type 2 diabetes mellitus without complications: Secondary | ICD-10-CM | POA: Diagnosis not present

## 2024-01-13 DIAGNOSIS — Z7984 Long term (current) use of oral hypoglycemic drugs: Secondary | ICD-10-CM | POA: Diagnosis not present

## 2024-01-13 DIAGNOSIS — M1612 Unilateral primary osteoarthritis, left hip: Secondary | ICD-10-CM | POA: Insufficient documentation

## 2024-01-13 DIAGNOSIS — I110 Hypertensive heart disease with heart failure: Secondary | ICD-10-CM | POA: Insufficient documentation

## 2024-01-13 DIAGNOSIS — I493 Ventricular premature depolarization: Secondary | ICD-10-CM | POA: Diagnosis not present

## 2024-01-13 DIAGNOSIS — Z79899 Other long term (current) drug therapy: Secondary | ICD-10-CM | POA: Insufficient documentation

## 2024-01-13 DIAGNOSIS — Z7902 Long term (current) use of antithrombotics/antiplatelets: Secondary | ICD-10-CM | POA: Diagnosis not present

## 2024-01-13 DIAGNOSIS — Z951 Presence of aortocoronary bypass graft: Secondary | ICD-10-CM | POA: Diagnosis not present

## 2024-01-13 DIAGNOSIS — I2510 Atherosclerotic heart disease of native coronary artery without angina pectoris: Secondary | ICD-10-CM | POA: Diagnosis not present

## 2024-01-13 DIAGNOSIS — E785 Hyperlipidemia, unspecified: Secondary | ICD-10-CM

## 2024-01-13 LAB — BASIC METABOLIC PANEL WITH GFR
Anion gap: 13 (ref 5–15)
BUN: 16 mg/dL (ref 8–23)
CO2: 22 mmol/L (ref 22–32)
Calcium: 9.4 mg/dL (ref 8.9–10.3)
Chloride: 105 mmol/L (ref 98–111)
Creatinine, Ser: 0.97 mg/dL (ref 0.44–1.00)
GFR, Estimated: >60 mL/min (ref >60)
Glucose, Bld: 156 mg/dL — ABNORMAL HIGH (ref 70–99)
Potassium: 4.7 mmol/L (ref 3.5–5.1)
Sodium: 140 mmol/L (ref 135–145)

## 2024-01-13 LAB — LIPID PANEL
Cholesterol: 265 mg/dL — ABNORMAL HIGH (ref 0–200)
HDL: 59 mg/dL (ref >40)
LDL Cholesterol: 156 mg/dL — ABNORMAL HIGH (ref 0–99)
Total CHOL/HDL Ratio: 4.5 ratio
Triglycerides: 251 mg/dL — ABNORMAL HIGH (ref <150)
VLDL: 50 mg/dL — ABNORMAL HIGH (ref 0–40)

## 2024-01-13 LAB — BRAIN NATRIURETIC PEPTIDE: B Natriuretic Peptide: 95.8 pg/mL (ref 0.0–100.0)

## 2024-01-13 LAB — TSH: TSH: 1.05 u[IU]/mL (ref 0.350–4.500)

## 2024-01-13 MED ORDER — METOPROLOL SUCCINATE ER 100 MG PO TB24: 100.0000 mg | ORAL_TABLET | Freq: Two times a day (BID) | ORAL | 3 refills | Status: AC

## 2024-01-13 NOTE — Progress Notes (Signed)
 ADVANCED HF CLINIC NOTE  Primary Care: [place], Betty G, MD HF Cardiologist: Dr. Rolan  Chief complaint: CHF  HPI: [name] is a 73 y.o. female w/ chronic systolic heart failure, CAD, PVCs, type 2DM, HTN and HLD.    Referred for evaluation of palpitations 6/22. Cardiac monitor showed 24% PVC burden. Echo EF 55-60%, RV normal.    11/22 she was ordered to get stress test prior to surgical clearance for back surgery. This showed medium to large defects in the apical anterior/anterior septal distribution as well as basal inferior lateral distribution which was suggestive of scar and mild peri-infarct ischemia. She was subsequently referred for [facility] 12/22 which showed severe obstructive CAD with subtotal 99% mid-LAD and complex LCx disease with severe stenoses involving the bifurication of 4 branches with CT surgery consult recommended as LCx disease was not amenable to PCI. Repeat echo showed her EF was mildly reduced at 45-50% with mild LVH, normal RV function and trivial MR. She did meet with Dr. Shyrl on 03/22/2021 and by review of notes was very hesitant about proceeding with surgical intervention and was informed to make them aware with her ultimate decision about proceeding with CABG.    She went for a 2nd opinion and saw Dr. Fleeta Ochoa 4/23 who also recommended CABG, but pt remained hesitant.    Presented to ED 08/22/21 w/ CC, nausea/vomiting and diarrhea. Notes outlined that she had CP and dyspnea but pt denies this. She was found to have an AKI and diagnosed w/ colitis. Also treated for CAP. HsTn 86>>80. Level not consistent with ACS. Felt most likely demand ischemia in the setting of dehydration (vomiting/diarrhea), AKI, acute infection. Echo repeated EF 40-45% w septal apical and inferior apical hypokinesis.    She finally had CABG in 8/23 with LIMA-LAD, SVG-OM1, SVG-OM2.  She had transient post-op atrial fibrillation.   Post-op echo in 11/23 showed EF 45-50% with normal RV.  She  was started on spironolactone  but she could not tolerate this due to diarrhea that started on spironolactone  and resolved once she had stopped it.   She had urgent work-in from [facility] 03/17/22 with HR in 40's. She felt palpitations, in our office, HR 89 on ECG with 2 PVCs. Toprol  increased with frequent PVCs (and not bradycardia). Zio 7 day placed to quantify and showed PVCs down to 13.5%.   Repeat Zio monitor in 7/24 showed 18.2% PVCs.  I referred her to EP for evaluation.  Mexiletine was offered but she preferred not to start it.   Echo in 2/25 showed EF 45-50%, RV mildly decreased systolic function, IVC normal. Technically difficult study.   Today she returns for HF follow up. Weight has trended up but she has had significant left hip pain and has not been getting much exercise.  THR has been recommended, seeing Dr. Jerri. No chest pain or exertional dyspnea.  She is able to walk up a flight of stairs as long as she uses her cane.  She takes Lasix  rarely.  Rare palpitations.  No orthopnea/PND.  No lightheadedness.   ECG (personally reviewed): NSR, 1st degree AVB, LAFB.   Labs (2/24): TSH low but free T4 normal Labs (6/24): LDL 72 Labs (1/25): K 3.4, creatinine 0.99, TSH normal, free T4 normal Labs (3/25): K 3.7, creatinine 0.9, BNP 292   PMH: 1.  Chronic systolic CHF: Ischemic cardiomyopathy.  - Echo (12/22): EF 45-50%, RV normal - Echo (5/23): EF 40-45%, RV normal  - Echo (11/23): EF 45-50%, normal  RV - Echo (2/25): EF 45-50%, RV mildly decreased systolic function, IVC normal. Technically difficult study.  2. CAD: LHC (10/22) with subtotal mid LAD stenosis, complete LCx disease with severe stenoses involving bifurcation of 4 branches of the LCx.  - CABG (8/23) with LIMA-LAD, SVG-OM1, SVG-OM2 3. Atrial fibrillation: Transient post-CABG.   4. Hyperlipidemia: Unable to take statins.  5. HTN 6. Type 2 diabetes 7. PVCs: Zio monitor (8/23) with 23% PVCs - Zio 7 day (12/23): 13.5 %  PVCs - Zio 7 day (7/24): 18.2% PVCs 8. Generalized anxiety  Review of Systems: All systems reviewed and negative except as per HPI.   Current Outpatient Medications  Medication Sig Dispense Refill   Alcohol Swabs (B-D SINGLE USE SWABS REGULAR) PADS Use to test blood sugar 3 times daily 100 each 3   Blood Glucose Monitoring Suppl (ONETOUCH VERIO FLEX SYSTEM) w/Device KIT Use to test blood sugars 1-2 times daily. 1 kit 0   clonazePAM  (KLONOPIN ) 1 MG tablet Take 1 tablet (1 mg total) by mouth 2 (two) times daily as needed. for anxiety 60 tablet 3   clopidogrel  (PLAVIX ) 75 MG tablet Take 1 tablet (75 mg total) by mouth daily. 90 tablet 3   Dulaglutide  (TRULICITY ) 1.5 MG/0.5ML SOAJ Inject 1.5 mg into the skin once a week. Getting through PAP     ezetimibe  (ZETIA ) 10 MG tablet Take 1 tablet (10 mg total) by mouth daily. 90 tablet 3   fluticasone  (FLONASE ) 50 MCG/ACT nasal spray Place 2 sprays into both nostrils daily. 16 g 11   furosemide  (LASIX ) 20 MG tablet TAKE 1 TABLET BY MOUTH ONCE DAILY AS NEEDED 30 tablet 0   gabapentin  (NEURONTIN ) 600 MG tablet TAKE 1 TABLET BY MOUTH EVERY MORNING AND AT NOON AND EVERY EVENING AND AT BEDTIME 120 tablet 3   guaiFENesin  (MUCINEX ) 600 MG 12 hr tablet Take 600 mg by mouth daily.     HYDROcodone -acetaminophen  (NORCO) 10-325 MG tablet Take 1 tablet by mouth every 6 (six) hours as needed. 120 tablet 0   JARDIANCE  10 MG TABS tablet Take 1 tablet by mouth once daily 90 tablet 0   Lancets (ONETOUCH ULTRASOFT) lancets Use to test blood sugar 1-2 times daily. 100 each 12   Melatonin 10 MG CHEW Chew 2 tablets by mouth at bedtime.     ondansetron  (ZOFRAN -ODT) 4 MG disintegrating tablet Take 1 tablet (4 mg total) by mouth daily as needed for nausea or vomiting. 20 tablet 2   pantoprazole  (PROTONIX ) 40 MG tablet Take 1 tablet by mouth once daily 90 tablet 3   Polyethyl Glycol-Propyl Glycol (SYSTANE) 0.4-0.3 % SOLN Place 1 drop into both eyes daily as needed (Dry eye).      potassium chloride  SA (KLOR-CON  M) 20 MEQ tablet Take 1 tablet by mouth once daily 30 tablet 0   QUEtiapine  (SEROQUEL ) 100 MG tablet TAKE 1/2 (ONE-HALF) TABLET BY MOUTH AT BEDTIME 45 tablet 3   REPATHA  SURECLICK 140 MG/ML SOAJ INJECT 140 MG SUBCUTANEOUSLY  EVERY 14 DAYS 6 mL 0   rOPINIRole  (REQUIP ) 0.5 MG tablet Take 1 tablet (0.5 mg total) by mouth at bedtime. 90 tablet 1   sacubitril -valsartan  (ENTRESTO ) 24-26 MG Take 1 tablet by mouth 2 (two) times daily. 60 tablet 11   VENTOLIN  HFA 108 (90 Base) MCG/ACT inhaler INHALE 2 PUFFS BY MOUTH EVERY 6 HOURS AS NEEDED FOR WHEEZING OR SHORTNESS OF BREATH 18 g 0   metoprolol  succinate (TOPROL -XL) 100 MG 24 hr tablet Take 1 tablet (100 mg  total) by mouth 2 (two) times daily. 90 tablet 3   No current facility-administered medications for this encounter.   Allergies  Allergen Reactions   Hydromorphone  Nausea And Vomiting and Other (See Comments)    Made the patient feel spaced out,  Other Reaction(s): GI Intolerance, Other (See Comments)  Made the patient feel spaced out, also   Morphine  And Codeine Itching    With high doses   Statins     Hx intolerance to multiple statins   Penicillins Rash    Has patient had a PCN reaction causing immediate rash, facial/tongue/throat swelling, SOB or lightheadedness with hypotension: Yes  Has patient had a PCN reaction causing severe rash involving mucus membranes or skin necrosis: No  Has patient had a PCN reaction that required hospitalization: No  Has patient had a PCN reaction occurring within the last 10 years: No  If all of the above answers are NO, then may proceed with Cephalosporin use.  Has patient had a PCN reaction causing immediate rash, facial/tongue/throat swelling, SOB or lightheadedness with hypotension: Yes, Has patient had a PCN reaction causing severe rash involving mucus membranes or skin necrosis: No, Has patient had a PCN reaction that required hospitalization: No, Has patient  had a PCN reaction occurring within the last 10 years: No, If all of the above answers are NO, then may proceed with Cephalosporin use.   Social History   Socioeconomic History   Marital status: Widowed    Spouse name: Not on file   Number of children: 1   Years of education: Not on file   Highest education level: Some college, no degree  Occupational History   Occupation: retired    Comment: work part time  Tobacco Use   Smoking status: Never   Smokeless tobacco: Never   Tobacco comments:    Never smoke 11/01/21  Vaping Use   Vaping status: Never Used  Substance and Sexual Activity   Alcohol use: Never   Drug use: No   Sexual activity: Not on file  Other Topics Concern   Not on file  Social History Narrative   Not on file   Social Drivers of Health   Financial Resource Strain: Medium Risk (04/13/2023)   Overall Financial Resource Strain (CARDIA)    Difficulty of Paying Living Expenses: Somewhat hard  Food Insecurity: Patient Declined (04/13/2023)   Hunger Vital Sign    Worried About Running Out of Food in the Last Year: Patient declined    Ran Out of Food in the Last Year: Patient declined  Transportation Needs: No Transportation Needs (04/13/2023)   PRAPARE - Administrator, Civil Service (Medical): No    Lack of Transportation (Non-Medical): No  Physical Activity: Insufficiently Active (04/13/2023)   Exercise Vital Sign    Days of Exercise per Week: 3 days    Minutes of Exercise per Session: 10 min  Stress: Stress Concern Present (04/13/2023)   [employer] of Occupational Health - Occupational Stress Questionnaire    Feeling of Stress : Rather much  Social Connections: Moderately Isolated (04/13/2023)   Social Connection and Isolation Panel    Frequency of Communication with Friends and Family: Twice a week    Frequency of Social Gatherings with Friends and Family: Once a week    Attends Religious Services: More than 4 times per year    Active Member of  [employer] or Organizations: No    Attends Banker Meetings: Not on file    Marital  Status: Widowed  Intimate Partner Violence: Not At Risk (01/12/2023)   Humiliation, Afraid, Rape, and Kick questionnaire    Fear of Current or Ex-Partner: No    Emotionally Abused: No    Physically Abused: No    Sexually Abused: No   Family History  Problem Relation Age of Onset   Diabetes Mother    Alzheimer's disease Mother    Heart disease Mother    Lung cancer Father    Esophageal cancer Brother    Colon cancer Neg Hx    BP (!) 140/80   Pulse 78   Wt 80.9 kg (178 lb 6.4 oz)   SpO2 98%   BMI 27.13 kg/m   PHYSICAL EXAM: General: NAD Neck: No JVD, palpable thyroid  nodule.  Lungs: Clear to auscultation bilaterally with normal respiratory effort. CV: Nondisplaced PMI.  Heart regular S1/S2, no S3/S4, no murmur.  No peripheral edema.  No carotid bruit.  Normal pedal pulses.  Abdomen: Soft, nontender, no hepatosplenomegaly, no distention.  Skin: Intact without lesions or rashes.  Neurologic: Alert and oriented x 3.  Psych: Normal affect. Extremities: No clubbing or cyanosis.  HEENT: Normal.   ASSESSMENT & PLAN: 1.  Chronic Systolic Heart Failure: Ischemic cardiomyopathy.  Echo 12/22 EF 45-50% => LHC with 2 vessel CAD w/ high grade mLAD disease.  Echo 5/23 with EF 40-45%.  She had CABG x 3 in 8/23.  Post-op echo in 11/23 with EF 45-50%, normal RV.  Echo in 2/25 again showed EF 45-50%, RV mildly decreased systolic function, IVC normal. She is not volume overloaded on exam, NYHA class I symptoms.   - Increase Toprol  XL to 100 mg bid, this may also help with PVCs.   - Continue Jardiance  10 mg daily.  - She did not tolerate spironolactone .  - Continue Entresto  24/26 bid, BMET/BNP today.   - Continue Lasix  PRN.  - EF is out of ICD range.  - Repeat echo at followup in 4 months.  2. CAD: LHC (12/22) with 2V CAD w/ high grade mLAD disease=> pt declined CABG initially but had CABG with  LIMA-LAD, SVG-OM1 and SVG-OM2 in 8/23.  No chest pain.  - Continue Plavix  long-term.  - Continue ? blocker.  - Continue Zetia  and Repatha , check lipids today.   3. Frequent PVCs: She has a long history of PVCs that pre-dated her CABG.  23% burden on Zio 8/23.  At last visit, only a couple of PVCs on ECG (no longer bigeminal). PVCs may arise from scar in LV from CAD.  Concerned that very frequent PVCs could worsen her cardiomyopathy. Repeat Zio 7 day (12/23) showed PVCs frequent but improved from previous, down to 13.5%.  However, 7/24 Zio showed PVCs back up to 18.2%.  She did not want to start mexiletine. Echo in 2/25 showed stable EF 45-50%, so LV function does not seem to be worsening with the presence of frequent PVCs. ECG today actually showed no PVCs.  - Increase Toprol  XL to 100 mg bid.  4. Hyperlipidemia: Unable to tolerate statins.  On Zetia  and Repatha . Goal LDL < 55.    - Check lipids today.  5. Atrial fibrillation: Transient post-CABG.  Now off amiodarone . No recurrence has been seen.  - If recurs, will need anticoagulation.  6. ? Hyperthyroidism: Most recently, TSH and free T4 normal. She has a thyroid  nodule.   - PCP managing.  7. Generalized anxiety  8. Osteoarthritis: Severe arthritis left hip. THR has been recommended.  She has minimal cardiopulmonary  symptoms and EF is only mildly depressed.  I think she is of reasonable risk to undergo surgery.  She will need to hold Plavix  prior to surgery and restart afterwards.  Continue Toprol  XL peri-operatively.   Follow up in 4 months with echo.   I spent 31 minutes reviewing records, interviewing/examining patient, and managing orders.   Ezra Shuck  01/13/2024

## 2024-01-13 NOTE — Telephone Encounter (Signed)
 Faxed surgical clearance to [phone]. Confirmation received

## 2024-01-13 NOTE — Patient Instructions (Signed)
 INCREASE Toprol  XL to 100 mg Twice daily  Labs reviewed by RN, will forward to MD for review  Your physician has requested that you have an echocardiogram. Echocardiography is a painless test that uses sound waves to create images of your heart. It provides your doctor with information about the size and shape of your heart and how well your heart's chambers and valves are working. This procedure takes approximately one hour. There are no restrictions for this procedure. Please do NOT wear cologne, perfume, aftershave, or lotions (deodorant is allowed). Please arrive 15 minutes prior to your appointment time.  Please note: We ask at that you not bring children with you during ultrasound (echo/ vascular) testing. Due to room size and safety concerns, children are not allowed in the ultrasound rooms during exams. Our front office staff cannot provide observation of children in our lobby area while testing is being conducted. An adult accompanying a patient to their appointment will only be allowed in the ultrasound room at the discretion of the ultrasound technician under special circumstances. We apologize for any inconvenience.  Your physician recommends that you schedule a follow-up appointment in: 4 months with an echocardiogram (February 2026) **PLEASE CALL THE OFFICE IN DECEMBER TO ARRANGE YOUR FOLLOW UP APPOINTMENT.**  If you have any questions or concerns before your next appointment please send us  a message through [place] or call our office at [phone].    TO LEAVE A MESSAGE FOR THE NURSE SELECT OPTION 2, PLEASE LEAVE A MESSAGE INCLUDING: YOUR NAME DATE OF BIRTH CALL BACK NUMBER REASON FOR CALL**this is important as we prioritize the call backs  YOU WILL RECEIVE A CALL BACK THE SAME DAY AS LONG AS YOU CALL BEFORE 4:00 PM  At the [facility], you and your health needs are our priority. As part of our continuing mission to provide you with exceptional heart care, we  have created designated Provider Care Teams. These Care Teams include your primary Cardiologist (physician) and Advanced Practice Providers (APPs- Physician Assistants and Nurse Practitioners) who all work together to provide you with the care you need, when you need it.   You may see any of the following providers on your designated Care Team at your next follow up: Dr Toribio Fuel Dr Ezra Shuck Dr. Ria Commander Dr. Morene Brownie Amy Lenetta, NP Caffie Shed, [place] [facility] [place], [place] Beckey Coe, NP [place] Lee, NP Ellouise Class, NP Tinnie Redman, PharmD Jaun Bash, PharmD   Please be sure to bring in all your medications bottles to every appointment.    Thank you for choosing [facility]

## 2024-01-15 ENCOUNTER — Ambulatory Visit: Payer: Medicare Other

## 2024-01-15 VITALS — Ht 68.0 in | Wt 178.0 lb

## 2024-01-15 DIAGNOSIS — Z0000 Encounter for general adult medical examination without abnormal findings: Secondary | ICD-10-CM

## 2024-01-15 NOTE — Progress Notes (Signed)
 Subjective:   [name] is a 73 y.o. who presents for a Medicare Wellness preventive visit.  As a reminder, Annual Wellness Visits don't include a physical exam, and some assessments may be limited, especially if this visit is performed virtually. We may recommend an in-person follow-up visit with your provider if needed.  Visit Complete: Virtual I connected with  [name] on 01/15/24 by a audio enabled telemedicine application and verified that I am speaking with the correct person using two identifiers.  Patient Location: Home  Provider Location: Home Office  I discussed the limitations of evaluation and management by telemedicine. The patient expressed understanding and agreed to proceed.  Vital Signs: Because this visit was a virtual/telehealth visit, some criteria may be missing or patient reported. Any vitals not documented were not able to be obtained and vitals that have been documented are patient reported.    Persons Participating in Visit: Patient.  AWV Questionnaire: Yes: Patient Medicare AWV questionnaire was completed by the patient on 01/11/24; I have confirmed that all information answered by patient is correct and no changes since this date.  Cardiac Risk Factors include: advanced age (>69men, >17 women);diabetes mellitus;hypertension     Objective:    Today's Vitals   01/15/24 1556  Weight: 178 lb (80.7 kg)  Height: 5' 8 (1.727 m)   Body mass index is 27.06 kg/m.     01/15/2024    4:02 PM 04/23/2023   11:29 AM 01/12/2023    4:29 PM 04/18/2022    2:44 PM 01/07/2022    2:34 PM 11/14/2021    3:00 PM 11/14/2021    6:24 AM  Advanced Directives  Does Patient Have a Medical Advance Directive? No No No Yes Yes Yes Yes  Type of Theme park manager;Living will Living will Living will   Does patient want to make changes to medical advance directive?    No - Patient declined  No - Patient declined No - Patient declined   Copy of Healthcare Power of Attorney in Chart?    No - copy requested No - copy requested    Would patient like information on creating a medical advance directive? No - Patient declined  No - Patient declined        Current Medications (verified) Outpatient Encounter Medications as of 01/15/2024  Medication Sig   Alcohol Swabs (B-D SINGLE USE SWABS REGULAR) PADS Use to test blood sugar 3 times daily   Blood Glucose Monitoring Suppl (ONETOUCH VERIO FLEX SYSTEM) w/Device KIT Use to test blood sugars 1-2 times daily.   clonazePAM  (KLONOPIN ) 1 MG tablet Take 1 tablet (1 mg total) by mouth 2 (two) times daily as needed. for anxiety   clopidogrel  (PLAVIX ) 75 MG tablet Take 1 tablet (75 mg total) by mouth daily.   Dulaglutide  (TRULICITY ) 1.5 MG/0.5ML SOAJ Inject 1.5 mg into the skin once a week. Getting through PAP   ezetimibe  (ZETIA ) 10 MG tablet Take 1 tablet (10 mg total) by mouth daily.   fluticasone  (FLONASE ) 50 MCG/ACT nasal spray Place 2 sprays into both nostrils daily.   furosemide  (LASIX ) 20 MG tablet TAKE 1 TABLET BY MOUTH ONCE DAILY AS NEEDED   gabapentin  (NEURONTIN ) 600 MG tablet TAKE 1 TABLET BY MOUTH EVERY MORNING AND AT NOON AND EVERY EVENING AND AT BEDTIME   guaiFENesin  (MUCINEX ) 600 MG 12 hr tablet Take 600 mg by mouth daily.   HYDROcodone -acetaminophen  (NORCO) 10-325 MG tablet Take 1 tablet by  mouth every 6 (six) hours as needed.   JARDIANCE  10 MG TABS tablet Take 1 tablet by mouth once daily   Lancets (ONETOUCH ULTRASOFT) lancets Use to test blood sugar 1-2 times daily.   Melatonin 10 MG CHEW Chew 2 tablets by mouth at bedtime.   metoprolol  succinate (TOPROL -XL) 100 MG 24 hr tablet Take 1 tablet (100 mg total) by mouth 2 (two) times daily.   ondansetron  (ZOFRAN -ODT) 4 MG disintegrating tablet Take 1 tablet (4 mg total) by mouth daily as needed for nausea or vomiting.   pantoprazole  (PROTONIX ) 40 MG tablet Take 1 tablet by mouth once daily   Polyethyl Glycol-Propyl Glycol  (SYSTANE) 0.4-0.3 % SOLN Place 1 drop into both eyes daily as needed (Dry eye).   potassium chloride  SA (KLOR-CON  M) 20 MEQ tablet Take 1 tablet by mouth once daily   QUEtiapine  (SEROQUEL ) 100 MG tablet TAKE 1/2 (ONE-HALF) TABLET BY MOUTH AT BEDTIME   REPATHA  SURECLICK 140 MG/ML SOAJ INJECT 140 MG SUBCUTANEOUSLY  EVERY 14 DAYS   rOPINIRole  (REQUIP ) 0.5 MG tablet Take 1 tablet (0.5 mg total) by mouth at bedtime.   sacubitril -valsartan  (ENTRESTO ) 24-26 MG Take 1 tablet by mouth 2 (two) times daily.   VENTOLIN  HFA 108 (90 Base) MCG/ACT inhaler INHALE 2 PUFFS BY MOUTH EVERY 6 HOURS AS NEEDED FOR WHEEZING OR SHORTNESS OF BREATH   No facility-administered encounter medications on file as of 01/15/2024.    Allergies (verified) Hydromorphone , Morphine  and codeine, Statins, and Penicillins   History: Past Medical History:  Diagnosis Date   Adrenal gland cyst    Anxiety    Arthritis    CHF (congestive heart failure) (HCC)    Diabetes mellitus without complication (HCC)    GERD (gastroesophageal reflux disease)    Heart murmur    Hyperlipidemia    Hypertension    Neuromuscular disorder (HCC)    Palpitations    Pneumonia    PONV (postoperative nausea and vomiting)    PTSD (post-traumatic stress disorder)    Ringing of ears, left    Past Surgical History:  Procedure Laterality Date   ABDOMINAL HYSTERECTOMY     partial   ACHILLES TENDON SURGERY Left    APPENDECTOMY     BACK SURGERY     BLADDER SUSPENSION     BREAST SURGERY Right 2003   milk duct removed   CARDIAC CATHETERIZATION     CHOLECYSTECTOMY     CORONARY ARTERY BYPASS GRAFT N/A 11/14/2021   Procedure: CORONARY ARTERY BYPASS GRAFTING (CABG) TIMES THREE USING LIMA AND CRYO VEIN.;  Surgeon: Obadiah Coy, MD;  Location: [facility] OR;  Service: Open Heart Surgery;  Laterality: N/A;   FRACTURE SURGERY     collar bone   LEFT HEART CATH AND CORONARY ANGIOGRAPHY N/A 03/08/2021   Procedure: LEFT HEART CATH AND CORONARY ANGIOGRAPHY;   Surgeon: Swazil[place], Peter M, MD;  Location: [facility] INVASIVE CV LAB;  Service: Cardiovascular;  Laterality: N/A;   NOSE SURGERY     x 2    OTHER SURGICAL HISTORY     Breast Duct removed   PARTIAL HYSTERECTOMY     RECTOCELE REPAIR     TEE WITHOUT CARDIOVERSION N/A 11/14/2021   Procedure: TRANSESOPHAGEAL ECHOCARDIOGRAM (TEE);  Surgeon: Obadiah Coy, MD;  Location: [facility] OR;  Service: Open Heart Surgery;  Laterality: N/A;   TONSILLECTOMY     VASCULAR SURGERY Right    right leg   VEIN SURGERY Right    leg   Family History  Problem Relation Age of  Onset   Diabetes Mother    Alzheimer's disease Mother    Heart disease Mother    Lung cancer Father    Esophageal cancer Brother    Colon cancer Neg Hx    Social History   Socioeconomic History   Marital status: Widowed    Spouse name: Not on file   Number of children: 1   Years of education: Not on file   Highest education level: Some college, no degree  Occupational History   Occupation: retired    Comment: work part time  Tobacco Use   Smoking status: Never   Smokeless tobacco: Never   Tobacco comments:    Never smoke 11/01/21  Vaping Use   Vaping status: Never Used  Substance and Sexual Activity   Alcohol use: Never   Drug use: No   Sexual activity: Not on file  Other Topics Concern   Not on file  Social History Narrative   Not on file   Social Drivers of Health   Financial Resource Strain: Low Risk  (01/15/2024)   Overall Financial Resource Strain (CARDIA)    Difficulty of Paying Living Expenses: Not hard at all  Food Insecurity: No Food Insecurity (01/15/2024)   Hunger Vital Sign    Worried About Running Out of Food in the Last Year: Never true    Ran Out of Food in the Last Year: Never true  Transportation Needs: No Transportation Needs (01/15/2024)   PRAPARE - Administrator, Civil Service (Medical): No    Lack of Transportation (Non-Medical): No  Physical Activity: Inactive (01/15/2024)   Exercise Vital  Sign    Days of Exercise per Week: 0 days    Minutes of Exercise per Session: 0 min  Stress: No Stress Concern Present (01/15/2024)   [employer] of Occupational Health - Occupational Stress Questionnaire    Feeling of Stress: Not at all  Social Connections: Moderately Integrated (01/15/2024)   Social Connection and Isolation Panel    Frequency of Communication with Friends and Family: More than three times a week    Frequency of Social Gatherings with Friends and Family: More than three times a week    Attends Religious Services: More than 4 times per year    Active Member of [employer] or Organizations: Yes    Attends Banker Meetings: More than 4 times per year    Marital Status: Widowed    Tobacco Counseling Counseling given: Not Answered Tobacco comments: Never smoke 11/01/21    Clinical Intake:  Pre-visit preparation completed: Yes  Pain : No/denies pain     BMI - recorded: 27.06 Nutritional Status: BMI 25 -29 Overweight Nutritional Risks: None Diabetes: Yes CBG done?: No Did pt. bring in CBG monitor from home?: No  Lab Results  Component Value Date   HGBA1C 5.7 11/04/2023   HGBA1C 5.9 04/14/2023   HGBA1C 6.4 11/05/2022     How often do you need to have someone help you when you read instructions, pamphlets, or other written materials from your doctor or pharmacy?: 1 - Never  Interpreter Needed?: No  Information entered by :: Yuliza Cara LPN   Activities of Daily Living     01/15/2024    4:01 PM 01/11/2024    3:46 PM  In your present state of health, do you have any difficulty performing the following activities:  Hearing? 0 0  Vision? 0 0  Difficulty concentrating or making decisions? 0 0  Walking or climbing stairs? 1 0  Comment Uses a Cane   Dressing or bathing? 0 0  Doing errands, shopping? 0 0  Preparing Food and eating ? N N  Using the Toilet? N N  In the past six months, have you accidently leaked urine? N N  Do you have  problems with loss of bowel control? N N  Managing your Medications? N N  Managing your Finances? N N  Housekeeping or managing your Housekeeping? N N    Patient Care Team: [place], Betty G, MD as PCP - General (Family Medicine) Okey Vina GAILS, MD as PCP - Cardiology (Cardiology) Rolan Ezra RAMAN, MD as PCP - Advanced Heart Failure (Cardiology) Waddell Danelle ORN, MD as PCP - Electrophysiology (Cardiology)  I have updated your Care Teams any recent Medical Services you may have received from other providers in the past year.     Assessment:   This is a routine wellness examination for Fatimata.  Hearing/Vision screen Hearing Screening - Comments:: Denies hearing difficulties   Vision Screening - Comments:: Wears rx glasses - up to date with routine eye exams with  Dr Octavia   Goals Addressed               This Visit's Progress     Increase physical activity (pt-stated)        Get more active after hip replacement.       Depression Screen     01/15/2024    4:00 PM 12/29/2023    2:13 PM 10/27/2023    2:08 PM 08/25/2023    2:08 PM 06/25/2023    2:00 PM 04/28/2023    2:01 PM 04/28/2023    1:49 PM  PHQ 2/9 Scores  PHQ - 2 Score 0 2 2 0 0 0 0  PHQ- 9 Score    0       Fall Risk     01/15/2024    4:01 PM 01/11/2024    3:46 PM 12/29/2023    2:13 PM 10/27/2023    2:08 PM 08/25/2023    2:08 PM  Fall Risk   Falls in the past year? 0 0 0 0 0  Number falls in past yr: 0   0   Injury with Fall? 0   0   Risk for fall due to : No Fall Risks      Follow up Falls evaluation completed        MEDICARE RISK AT HOME:  Medicare Risk at Home Any stairs in or around the home?: No If so, are there any without handrails?: No Home free of loose throw rugs in walkways, pet beds, electrical cords, etc?: Yes Adequate lighting in your home to reduce risk of falls?: Yes Life alert?: Yes Use of a cane, walker or w/c?: Yes Grab bars in the bathroom?: Yes Elevated toilet seat or a handicapped  toilet?: Yes  TIMED UP AND GO:  Was the test performed?  No  Cognitive Function: 6CIT completed        01/15/2024    4:02 PM 01/12/2023    4:29 PM 01/07/2022    2:34 PM  6CIT Screen  What Year? 0 points 0 points 0 points  What month? 0 points 0 points 0 points  What time? 0 points 0 points 0 points  Count back from 20 0 points 0 points 0 points  Months in reverse 0 points 0 points 0 points  Repeat phrase 0 points 0 points 0 points  Total Score 0 points 0 points 0  points    Immunizations Immunization History  Administered Date(s) Administered   Influenza Split 01/28/2012, 04/07/2012   Influenza, Quadrivalent, Recombinant, Inj, Pf 12/18/2018   Influenza,inj,Quad PF,6+ Mos 12/29/2016   Influenza-Unspecified 02/03/2013, 03/06/2020, 12/19/2020   MODERNA COVID-19 SARS-COV-2 PEDS BIVALENT BOOSTER 96yr-63yr 07/13/2019   Pneumococcal Conjugate-13 01/13/2017   Pneumococcal Polysaccharide-23 11/17/2018   Zoster Recombinant(Shingrix) 12/10/2018   Zoster, Live 03/15/2015, 03/24/2015    Screening Tests Health Maintenance  Topic Date Due   DTaP/Tdap/Td (1 - Tdap) Never done   Zoster Vaccines- Shingrix (2 of 2) 02/04/2019   Colonoscopy  02/18/2022   Diabetic kidney evaluation - Urine ACR  01/18/2023   FOOT EXAM  09/03/2023   Influenza Vaccine  11/06/2023   COVID-19 Vaccine (2 - 2025-26 season) 12/07/2023   Mammogram  04/13/2024 (Originally 05/18/2020)   HEMOGLOBIN A1C  05/06/2024   OPHTHALMOLOGY EXAM  11/05/2024   Diabetic kidney evaluation - eGFR measurement  01/12/2025   Medicare Annual Wellness (AWV)  01/14/2025   Pneumococcal Vaccine: 50+ Years  Completed   DEXA SCAN  Completed   Hepatitis C Screening  Completed   Meningococcal B Vaccine  Aged Out    Health Maintenance Items Addressed: Colonoscopy deferred  Additional Screening:  Vision Screening: Recommended annual ophthalmology exams for early detection of glaucoma and other disorders of the eye. Is the patient up  to date with their annual eye exam?  Yes  Who is the provider or what is the name of the office in which the patient attends annual eye exams? Dr Octavia  Dental Screening: Recommended annual dental exams for proper oral hygiene  Community Resource Referral / Chronic Care Management: CRR required this visit?  No   CCM required this visit?  No   Plan:    I have personally reviewed and noted the following in the patient's chart:   Medical and social history Use of alcohol, tobacco or illicit drugs  Current medications and supplements including opioid prescriptions. Patient is not currently taking opioid prescriptions. Functional ability and status Nutritional status Physical activity Advanced directives List of other physicians Hospitalizations, surgeries, and ER visits in previous 12 months Vitals Screenings to include cognitive, depression, and falls Referrals and appointments  In addition, I have reviewed and discussed with patient certain preventive protocols, quality metrics, and best practice recommendations. A written personalized care plan for preventive services as well as general preventive health recommendations were provided to patient.   Rojelio LELON Blush, LPN   [id number]/[id number]   After Visit Summary: (MyChart) Due to this being a telephonic visit, the after visit summary with patients personalized plan was offered to patient via MyChart   Notes: Nothing significant to report at this time.

## 2024-01-15 NOTE — Patient Instructions (Addendum)
 [name],  Thank you for taking the time for your Medicare Wellness Visit. I appreciate your continued commitment to your health goals. Please review the care plan we discussed, and feel free to reach out if I can assist you further.  Medicare recommends these wellness visits once per year to help you and your care team stay ahead of potential health issues. These visits are designed to focus on prevention, allowing your provider to concentrate on managing your acute and chronic conditions during your regular appointments.  Please note that Annual Wellness Visits do not include a physical exam. Some assessments may be limited, especially if the visit was conducted virtually. If needed, we may recommend a separate in-person follow-up with your provider.  Ongoing Care Seeing your primary care provider every 3 to 6 months helps us  monitor your health and provide consistent, personalized care.   Referrals If a referral was made during today's visit and you haven't received any updates within two weeks, please contact the referred provider directly to check on the status.  Recommended Screenings:  Health Maintenance  Topic Date Due   DTaP/Tdap/Td vaccine (1 - Tdap) Never done   Zoster (Shingles) Vaccine (2 of 2) 02/04/2019   Colon Cancer Screening  02/18/2022   Yearly kidney health urinalysis for diabetes  01/18/2023   Complete foot exam   09/03/2023   Flu Shot  11/06/2023   COVID-19 Vaccine (2 - 2025-26 season) 12/07/2023   Breast Cancer Screening  04/13/2024*   Hemoglobin A1C  05/06/2024   Eye exam for diabetics  11/05/2024   Yearly kidney function blood test for diabetes  01/12/2025   Medicare Annual Wellness Visit  01/14/2025   Pneumococcal Vaccine for age over 34  Completed   DEXA scan (bone density measurement)  Completed   Hepatitis C Screening  Completed   Meningitis B Vaccine  Aged Out  *Topic was postponed. The date shown is not the original due date.       01/15/2024     4:02 PM  Advanced Directives  Does Patient Have a Medical Advance Directive? No  Would patient like information on creating a medical advance directive? No - Patient declined   Advance Care Planning is important because it: Ensures you receive medical care that aligns with your values, goals, and preferences. Provides guidance to your family and loved ones, reducing the emotional burden of decision-making during critical moments.  Vision: Annual vision screenings are recommended for early detection of glaucoma, cataracts, and diabetic retinopathy. These exams can also reveal signs of chronic conditions such as diabetes and high blood pressure.  Dental: Annual dental screenings help detect early signs of oral cancer, gum disease, and other conditions linked to overall health, including heart disease and diabetes.  Please see the attached documents for additional preventive care recommendations.

## 2024-01-16 ENCOUNTER — Encounter: Payer: Self-pay | Admitting: Family Medicine

## 2024-01-18 ENCOUNTER — Other Ambulatory Visit: Payer: Self-pay | Admitting: Family Medicine

## 2024-01-18 DIAGNOSIS — J989 Respiratory disorder, unspecified: Secondary | ICD-10-CM

## 2024-01-19 ENCOUNTER — Encounter: Admitting: Cardiology

## 2024-01-19 ENCOUNTER — Other Ambulatory Visit (HOSPITAL_COMMUNITY): Payer: Self-pay | Admitting: Cardiology

## 2024-01-20 ENCOUNTER — Encounter: Payer: Self-pay | Admitting: Family Medicine

## 2024-01-22 ENCOUNTER — Other Ambulatory Visit: Payer: Self-pay | Admitting: Family Medicine

## 2024-01-25 ENCOUNTER — Other Ambulatory Visit: Payer: Self-pay | Admitting: Family Medicine

## 2024-01-29 ENCOUNTER — Other Ambulatory Visit: Payer: Self-pay | Admitting: Family Medicine

## 2024-01-29 DIAGNOSIS — E876 Hypokalemia: Secondary | ICD-10-CM

## 2024-02-02 DIAGNOSIS — M25552 Pain in left hip: Secondary | ICD-10-CM | POA: Diagnosis not present

## 2024-02-02 NOTE — Progress Notes (Signed)
 Sent message, via epic in basket, requesting orders in epic from Careers adviser.

## 2024-02-03 ENCOUNTER — Encounter: Admitting: Physical Medicine & Rehabilitation

## 2024-02-03 DIAGNOSIS — Z961 Presence of intraocular lens: Secondary | ICD-10-CM | POA: Diagnosis not present

## 2024-02-03 DIAGNOSIS — H353132 Nonexudative age-related macular degeneration, bilateral, intermediate dry stage: Secondary | ICD-10-CM | POA: Diagnosis not present

## 2024-02-03 DIAGNOSIS — H353112 Nonexudative age-related macular degeneration, right eye, intermediate dry stage: Secondary | ICD-10-CM | POA: Diagnosis not present

## 2024-02-03 LAB — OPHTHALMOLOGY REPORT-SCANNED

## 2024-02-04 ENCOUNTER — Encounter: Payer: Self-pay | Admitting: Pharmacist

## 2024-02-04 ENCOUNTER — Ambulatory Visit: Attending: Internal Medicine | Admitting: Pharmacist

## 2024-02-04 DIAGNOSIS — E7800 Pure hypercholesterolemia, unspecified: Secondary | ICD-10-CM

## 2024-02-04 NOTE — Assessment & Plan Note (Signed)
 Assessment:  LDL goal: < 55 mg/dl last LDLc 843 mg/dl (89/7974) Tolerates Zetia   well without any side effects  Intolerance to multiple statins and aches and pain from Repatha  too  Discussed next potential options (PCSK-9 inhibitors, bempedoic acid and inclisiran); cost, dosing efficacy, side effects  Patient is willing to try Repatha  again   Plan: Continue taking current medications (Zetia  10 mg daily) Start taking Repatha  140 mg every 14 days  If intolerant to Repatha  will try to apply for exception for Praluent

## 2024-02-04 NOTE — Patient Instructions (Signed)
 SABRA

## 2024-02-04 NOTE — Progress Notes (Signed)
 Patient ID: [name]                 DOB: 01-27-1951                    MRN: [id number]      HPI: [name] is a 73 y.o. female patient referred to lipid clinic by Dr. Rolan. PMH is significant for CAD, hx of NSTEMI, s/p CABG x3, CHF, T2DM, RLS.   Patient has not been taking her Repatha  due to joint pain. Patient presented today for lipid clinic. Reports she has stopped taking Repatha  due to joint pain. Takes Zetia  10 mg daily. Tolerate that well. Admits her diet is not that great but she has started to cut down to bread and fried food. Can't do exercise due to hip arthritis. Reviewed options for lowering LDL cholesterol, including ezetimibe , PCSK-9 inhibitors, bempedoic acid and inclisiran.  Discussed mechanisms of action, dosing, side effects and potential decreases in LDL cholesterol.  Also reviewed cost information and potential options for patient assistance.   Current Medications: Repatha  140 mg every 2 weeks, ezetimibe  10 mg daily Intolerances: statins (which ones and reaction?) Risk Factors: CAD, hx of NSTEMI, s/p CABG x3, CHF, T2DM LDL goal: < 55; Trig goal: <150 Lipid panel (01/2024): Chol 265, Trig 251, HDL 59, LDL 156   Diet: uses air fryer a lot, cut down on bread  Stop eating at fast food places   Exercise: none due to hip arthritis    Family History:  Relation Problem Comments  Mother - Filomena Clarity Alzheimer's disease   Diabetes   Heart disease     Father Lung cancer     Brother (Deceased) Esophageal cancer     Neg Hx Colon cancer        Social History:  Alcohol:none  Smoking: none   Labs:  Lipid Panel     Component Value Date/Time   CHOL 265 (H) 01/13/2024 0957   CHOL 183 12/16/2016 1517   TRIG 251 (H) 01/13/2024 0957   HDL 59 01/13/2024 0957   HDL 53 12/16/2016 1517   CHOLHDL 4.5 01/13/2024 0957   VLDL 50 (H) 01/13/2024 0957   LDLCALC 156 (H) 01/13/2024 0957   LDLCALC 84 12/16/2016 1517   LDLDIRECT 140.0 01/28/2021 1007    LABVLDL 46 (H) 12/16/2016 1517    Past Medical History:  Diagnosis Date   Adrenal gland cyst    Anxiety    Arthritis    CHF (congestive heart failure) (HCC)    Diabetes mellitus without complication (HCC)    GERD (gastroesophageal reflux disease)    Heart murmur    Hyperlipidemia    Hypertension    Neuromuscular disorder (HCC)    Palpitations    Pneumonia    PONV (postoperative nausea and vomiting)    PTSD (post-traumatic stress disorder)    Ringing of ears, left     Current Outpatient Medications on File Prior to Visit  Medication Sig Dispense Refill   Alcohol Swabs (B-D SINGLE USE SWABS REGULAR) PADS Use to test blood sugar 3 times daily 100 each 3   Blood Glucose Monitoring Suppl (ONETOUCH VERIO FLEX SYSTEM) w/Device KIT Use to test blood sugars 1-2 times daily. 1 kit 0   clonazePAM  (KLONOPIN ) 1 MG tablet Take 1 tablet (1 mg total) by mouth 2 (two) times daily as needed. for anxiety 60 tablet 3   clopidogrel  (PLAVIX ) 75 MG tablet Take 1 tablet (75 mg total) by mouth daily.  90 tablet 3   Dulaglutide  (TRULICITY ) 1.5 MG/0.5ML SOAJ Inject 1.5 mg into the skin once a week. Getting through PAP     ezetimibe  (ZETIA ) 10 MG tablet Take 1 tablet (10 mg total) by mouth daily. 90 tablet 3   fluticasone  (FLONASE ) 50 MCG/ACT nasal spray Place 2 sprays into both nostrils daily. 16 g 11   furosemide  (LASIX ) 20 MG tablet TAKE 1 TABLET BY MOUTH ONCE DAILY AS NEEDED 30 tablet 0   gabapentin  (NEURONTIN ) 600 MG tablet TAKE 1 TABLET BY MOUTH EVERY MORNING AND AT NOON AND EVERY EVENING AND AT BEDTIME 120 tablet 3   guaiFENesin  (MUCINEX ) 600 MG 12 hr tablet Take 600 mg by mouth daily.     HYDROcodone -acetaminophen  (NORCO) 10-325 MG tablet Take 1 tablet by mouth every 6 (six) hours as needed. 120 tablet 0   JARDIANCE  10 MG TABS tablet Take 1 tablet by mouth once daily 90 tablet 0   Lancets (ONETOUCH ULTRASOFT) lancets Use to test blood sugar 1-2 times daily. 100 each 12   Melatonin 10 MG CHEW Chew  2 tablets by mouth at bedtime.     metoprolol  succinate (TOPROL -XL) 100 MG 24 hr tablet Take 1 tablet (100 mg total) by mouth 2 (two) times daily. 90 tablet 3   ondansetron  (ZOFRAN -ODT) 4 MG disintegrating tablet DISSOLVE 1 TABLET IN MOUTH ONCE DAILY AS NEEDED FOR NAUSEA FOR VOMITING 20 tablet 0   pantoprazole  (PROTONIX ) 40 MG tablet Take 1 tablet by mouth once daily 90 tablet 3   Polyethyl Glycol-Propyl Glycol (SYSTANE) 0.4-0.3 % SOLN Place 1 drop into both eyes daily as needed (Dry eye).     potassium chloride  SA (KLOR-CON  M) 20 MEQ tablet Take 1 tablet by mouth once daily 30 tablet 0   QUEtiapine  (SEROQUEL ) 100 MG tablet TAKE 1/2 (ONE-HALF) TABLET BY MOUTH AT BEDTIME 45 tablet 3   rOPINIRole  (REQUIP ) 0.5 MG tablet Take 1 tablet (0.5 mg total) by mouth at bedtime. 90 tablet 1   sacubitril -valsartan  (ENTRESTO ) 24-26 MG Take 1 tablet by mouth 2 (two) times daily. 60 tablet 11   VENTOLIN  HFA 108 (90 Base) MCG/ACT inhaler INHALE 2 PUFFS BY MOUTH EVERY 6 HOURS AS NEEDED FOR WHEEZING FOR SHORTNESS OF BREATH 18 g 0   No current facility-administered medications on file prior to visit.    Allergies  Allergen Reactions   Hydromorphone  Nausea And Vomiting and Other (See Comments)    Made the patient feel spaced out,  Other Reaction(s): GI Intolerance, Other (See Comments)  Made the patient feel spaced out, also   Morphine  And Codeine Itching    With high doses   Statins     Hx intolerance to multiple statins   Penicillins Rash    Has patient had a PCN reaction causing immediate rash, facial/tongue/throat swelling, SOB or lightheadedness with hypotension: Yes  Has patient had a PCN reaction causing severe rash involving mucus membranes or skin necrosis: No  Has patient had a PCN reaction that required hospitalization: No  Has patient had a PCN reaction occurring within the last 10 years: No  If all of the above answers are NO, then may proceed with Cephalosporin use.  Has patient  had a PCN reaction causing immediate rash, facial/tongue/throat swelling, SOB or lightheadedness with hypotension: Yes, Has patient had a PCN reaction causing severe rash involving mucus membranes or skin necrosis: No, Has patient had a PCN reaction that required hospitalization: No, Has patient had a PCN reaction occurring within the last 10  years: No, If all of the above answers are NO, then may proceed with Cephalosporin use.    Assessment/Plan:  1. Hyperlipidemia -  Problem  HYPERCHOLESTEROLEMIA   Qualifier: Diagnosis of  By: Drucilla BANANA, Pam      HYPERCHOLESTEROLEMIA Assessment:  LDL goal: < 55 mg/dl last LDLc 843 mg/dl (89/7974) Tolerates Zetia   well without any side effects  Intolerance to multiple statins and aches and pain from Repatha  too  Discussed next potential options (PCSK-9 inhibitors, bempedoic acid and inclisiran); cost, dosing efficacy, side effects  Patient is willing to try Repatha  again   Plan: Continue taking current medications (Zetia  10 mg daily) Start taking Repatha  140 mg every 14 days  If intolerant to Repatha  will try to apply for exception for Praluent    Thank you,  Robbi Blanch, Pharm.D [facility] [address], [place], [place] [address] Phone: [phone]; Fax: [phone]

## 2024-02-05 ENCOUNTER — Encounter: Payer: Self-pay | Admitting: Family Medicine

## 2024-02-05 ENCOUNTER — Telehealth: Payer: Self-pay | Admitting: Pharmacist

## 2024-02-05 NOTE — Progress Notes (Addendum)
  Date of COVID positive in last 90 days:  No  PCP - Betty Jordan, MD Cardiologist - Ezra Shuck, MD Electrophysiologist - Danelle Birmingham, MD  Chest x-ray - N/A EKG - 01-13-24 Epic Stress Test - 02-04-21 Epic ECHO - 06-04-23 Epic Cardiac Cath - 03-08-21 Epic Long Term Monitor - 10-30-22 Epic Pacemaker/ICD device last checked:N/A Spinal Cord Stimulator:N/A  Bowel Prep - N/A  Sleep Study - N/A CPAP -   Fasting Blood Sugar - 130 range Checks Blood Sugar - 2  times a week  Trulicity  Last dose of GLP1 agonist-  02-06-24 GLP1 instructions:  Hold until after surgery    Jardiance  Last dose of SGLT-2 inhibitors-  N/A SGLT-2 instructions:  Do not take after 02-13-24  Blood Thinner Instructions: Plavix  Last dose: 02-09-24 (patient states she was told to hold x7 days) Aspirin  Instructions:N/A Last Dose:  Activity level:  Can go up a flight of stairs and perform activities of daily living without stopping and without symptoms of chest pain or shortness of breath.  Limitations due to severe hip pain.    Anesthesia review: CAD, hx of MI and CABG, CHF, DM  Patient denies shortness of breath, fever, cough and chest pain at PAT appointment  Patient verbalized understanding of instructions that were given to them at the PAT appointment. Patient was also instructed that they will need to review over the PAT instructions again at home before surgery.

## 2024-02-05 NOTE — Progress Notes (Signed)
 Second request for pre op orders spoke with [facility].

## 2024-02-05 NOTE — Telephone Encounter (Signed)
 Patient called to say that she doesn't have any refills on the Reptha. She ask that she will respond in mychart. Please advise

## 2024-02-05 NOTE — Patient Instructions (Addendum)
 SURGICAL WAITING ROOM VISITATION Patients having surgery or a procedure may have no more than 2 support people in the waiting area - these visitors may rotate.    Children under the age of 57 must have an adult with them who is not the patient.  If the patient needs to stay at the hospital during part of their recovery, the visitor guidelines for inpatient rooms apply. Pre-op nurse will coordinate an appropriate time for 1 support person to accompany patient in pre-op.  This support person may not rotate.    Please refer to the [facility] website for the visitor guidelines for Inpatients (after your surgery is over and you are in a regular room).       Your procedure is scheduled on: 02-17-24   Report to [facility] Main Entrance    Report to admitting at 9:15 AM   Call this number if you have problems the morning of surgery [phone]   Do not eat food :After Midnight.   After Midnight you may have the following liquids until 8:45 AM DAY OF SURGERY  Water Non-Citrus Juices (without pulp, NO RED-Apple, White grape, White cranberry) Black Coffee (NO MILK/CREAM OR CREAMERS, sugar ok)  Clear Tea (NO MILK/CREAM OR CREAMERS, sugar ok) regular and decaf                             Plain Jell-O (NO RED)                                           Fruit ices (not with fruit pulp, NO RED)                                     Popsicles (NO RED)                                                               Sports drinks like Gatorade (NO RED)                   The day of surgery:  Drink ONE (1) Pre-Surgery G2 by 8:45 AM the morning of surgery. Drink in one sitting. Do not sip.  This drink was given to you during your hospital  pre-op appointment visit. Nothing else to drink after completing the Pre-Surgery G2.          If you have questions, please contact your surgeon's office.   FOLLOW  ANY ADDITIONAL PRE OP INSTRUCTIONS YOU RECEIVED FROM YOUR SURGEON'S OFFICE!!!      Oral Hygiene is also important to reduce your risk of infection.                                    Remember - BRUSH YOUR TEETH THE MORNING OF SURGERY WITH YOUR REGULAR TOOTHPASTE   Do NOT smoke after Midnight   Take these medicines the morning of surgery with A SIP OF WATER:    Ezetimibe    Gabapentin   Metoprolol    Pantoprazole    Okay to use inhalers, nasal spray   If needed Clonazepam , Hydrocodone   Stop all vitamins and herbal supplements 7 days before surgery  How to Manage Your Diabetes Before and After Surgery  Why is it important to control my blood sugar before and after surgery? Improving blood sugar levels before and after surgery helps healing and can limit problems. A way of improving blood sugar control is eating a healthy diet by:  Eating less sugar and carbohydrates  Increasing activity/exercise  Talking with your doctor about reaching your blood sugar goals High blood sugars (greater than 180 mg/dL) can raise your risk of infections and slow your recovery, so you will need to focus on controlling your diabetes during the weeks before surgery. Make sure that the doctor who takes care of your diabetes knows about your planned surgery including the date and location.  How do I manage my blood sugar before surgery? Check your blood sugar at least 4 times a day, starting 2 days before surgery, to make sure that the level is not too high or low. Check your blood sugar the morning of your surgery when you wake up and every 2 hours until you get to the Short Stay unit. If your blood sugar is less than 70 mg/dL, you will need to treat for low blood sugar: Do not take insulin . Treat a low blood sugar (less than 70 mg/dL) with  cup of clear juice (cranberry or apple), 4 glucose tablets, OR glucose gel. Recheck blood sugar in 15 minutes after treatment (to make sure it is greater than 70 mg/dL). If your blood sugar is not greater than 70 mg/dL on recheck, call [phone] for  further instructions. Report your blood sugar to the short stay nurse when you get to Short Stay.  If you are admitted to the hospital after surgery: Your blood sugar will be checked by the staff and you will probably be given insulin  after surgery (instead of oral diabetes medicines) to make sure you have good blood sugar levels. The goal for blood sugar control after surgery is 80-180 mg/dL.   WHAT DO I DO ABOUT MY DIABETES MEDICATION?  Do not take oral diabetes medicines (pills) the morning of surgery.  Hold Trulicity  7 days before surgery (do not take after 02-09-24).  Hold Jardiance  3 days before surgery (do not take after 02-13-24).  DO NOT TAKE THE FOLLOWING 7 DAYS PRIOR TO SURGERY: Ozempic, Wegovy, Rybelsus (Semaglutide), Byetta (exenatide), Bydureon (exenatide ER), Victoza, Saxenda (liraglutide), or Trulicity  (dulaglutide ) Mounjaro (Tirzepatide) Adlyxin (Lixisenatide), Polyethylene Glycol Loxenatide.  Reviewed and Endorsed by [facility] Patient Education Committee, August 2015                              You may not have any metal on your body including hair pins, jewelry, and body piercing             Do not wear make-up, lotions, powders, perfumes or deodorant  Do not wear nail polish including gel and S&S, artificial/acrylic nails, or any other type of covering on natural nails including finger and toenails. If you have artificial nails, gel coating, etc. that needs to be removed by a nail salon please have this removed prior to surgery or surgery may need to be canceled/ delayed if the surgeon/ anesthesia feels like they are unable to be safely monitored.   Do not shave  48 hours prior  to surgery.    Do not bring valuables to the hospital. [facility] IS NOT RESPONSIBLE   FOR VALUABLES.   Contacts, dentures or bridgework may not be worn into surgery.   Bring small overnight bag day of surgery.   DO NOT BRING YOUR HOME MEDICATIONS TO THE HOSPITAL. PHARMACY WILL DISPENSE  MEDICATIONS LISTED ON YOUR MEDICATION LIST TO YOU DURING YOUR ADMISSION IN THE HOSPITAL!     Special Instructions: Bring a copy of your healthcare power of attorney and living will documents the day of surgery if you haven't scanned them before.              Please read over the following fact sheets you were given: IF YOU HAVE QUESTIONS ABOUT YOUR PRE-OP INSTRUCTIONS PLEASE CALL [phone] Gwen  If you received a COVID test during your pre-op visit  it is requested that you wear a mask when out in public, stay away from anyone that may not be feeling well and notify your surgeon if you develop symptoms. If you test positive for Covid or have been in contact with anyone that has tested positive in the last 10 days please notify you surgeon.    Pre-operative 4 CHG Bath Instructions  DYNA-Hex 4 Chlorhexidine  Gluconate 4% Solution Antiseptic 4 fl. oz   You can play a key role in reducing the risk of infection after surgery. Your skin needs to be as free of germs as possible. You can reduce the number of germs on your skin by washing with CHG (chlorhexidine  gluconate) soap before surgery. CHG is an antiseptic soap that kills germs and continues to kill germs even after washing.   DO NOT use if you have an allergy to chlorhexidine /CHG or antibacterial soaps. If your skin becomes reddened or irritated, stop using the CHG and notify one of our RNs at   Please shower with the CHG soap starting 4 days before surgery using the following schedule:     Please keep in mind the following:  DO NOT shave, including legs and underarms, starting the day of your first shower.   You may shave your face at any point before/day of surgery.  Place clean sheets on your bed the day you start using CHG soap. Use a clean washcloth (not used since being washed) for each shower. DO NOT sleep with pets once you start using the CHG.  CHG Shower Instructions:  If you choose to wash your hair and private area, wash  first with your normal shampoo/soap.  After you use shampoo/soap, rinse your hair and body thoroughly to remove shampoo/soap residue.  Turn the water OFF and apply about 3 tablespoons (45 ml) of CHG soap to a CLEAN washcloth.  Apply CHG soap ONLY FROM YOUR NECK DOWN TO YOUR TOES (washing for 3-5 minutes)  DO NOT use CHG soap on face, private areas, open wounds, or sores.  Pay special attention to the area where your surgery is being performed.  If you are having back surgery, having someone wash your back for you may be helpful. Wait 2 minutes after CHG soap is applied, then you may rinse off the CHG soap.  Pat dry with a clean towel  Put on clean clothes/pajamas   If you choose to wear lotion, please use ONLY the CHG-compatible lotions on the back of this paper.     Additional instructions for the day of surgery: DO NOT APPLY any lotions, deodorants, cologne, or perfumes.   Put on clean/comfortable clothes.  Brush your teeth.  Ask your nurse before applying any prescription medications to the skin.   CHG Compatible Lotions   Aveeno Moisturizing lotion  Cetaphil Moisturizing Cream  Cetaphil Moisturizing Lotion  Clairol Herbal Essence Moisturizing Lotion, Dry Skin  Clairol Herbal Essence Moisturizing Lotion, Extra Dry Skin  Clairol Herbal Essence Moisturizing Lotion, Normal Skin  Curel Age Defying Therapeutic Moisturizing Lotion with Alpha Hydroxy  Curel Extreme Care Body Lotion  Curel Soothing Hands Moisturizing Hand Lotion  Curel Therapeutic Moisturizing Cream, Fragrance-Free  Curel Therapeutic Moisturizing Lotion, Fragrance-Free  Curel Therapeutic Moisturizing Lotion, Original Formula  Eucerin Daily Replenishing Lotion  Eucerin Dry Skin Therapy Plus Alpha Hydroxy Crme  Eucerin Dry Skin Therapy Plus Alpha Hydroxy Lotion  Eucerin Original Crme  Eucerin Original Lotion  Eucerin Plus Crme Eucerin Plus Lotion  Eucerin TriLipid Replenishing Lotion  Keri Anti-Bacterial Hand  Lotion  Keri Deep Conditioning Original Lotion Dry Skin Formula Softly Scented  Keri Deep Conditioning Original Lotion, Fragrance Free Sensitive Skin Formula  Keri Lotion Fast Absorbing Fragrance Free Sensitive Skin Formula  Keri Lotion Fast Absorbing Softly Scented Dry Skin Formula  Keri Original Lotion  Keri Skin Renewal Lotion Keri Silky Smooth Lotion  Keri Silky Smooth Sensitive Skin Lotion  Nivea Body Creamy Conditioning Oil  Nivea Body Extra Enriched Lotion  Nivea Body Original Lotion  Nivea Body Sheer Moisturizing Lotion Nivea Crme  Nivea Skin Firming Lotion  NutraDerm 30 Skin Lotion  NutraDerm Skin Lotion  NutraDerm Therapeutic Skin Cream  NutraDerm Therapeutic Skin Lotion  ProShield Protective Hand Cream  Provon moisturizing lotion   PATIENT SIGNATURE_________________________________  NURSE SIGNATURE__________________________________  ________________________________________________________________________    [name]  An incentive spirometer is a tool that can help keep your lungs clear and active. This tool measures how well you are filling your lungs with each breath. Taking long deep breaths may help reverse or decrease the chance of developing breathing (pulmonary) problems (especially infection) following: A long period of time when you are unable to move or be active. BEFORE THE PROCEDURE  If the spirometer includes an indicator to show your best effort, your nurse or respiratory therapist will set it to a desired goal. If possible, sit up straight or lean slightly forward. Try not to slouch. Hold the incentive spirometer in an upright position. INSTRUCTIONS FOR USE  Sit on the edge of your bed if possible, or sit up as far as you can in bed or on a chair. Hold the incentive spirometer in an upright position. Breathe out normally. Place the mouthpiece in your mouth and seal your lips tightly around it. Breathe in slowly and as deeply as possible,  raising the piston or the ball toward the top of the column. Hold your breath for 3-5 seconds or for as long as possible. Allow the piston or ball to fall to the bottom of the column. Remove the mouthpiece from your mouth and breathe out normally. Rest for a few seconds and repeat Steps 1 through 7 at least 10 times every 1-2 hours when you are awake. Take your time and take a few normal breaths between deep breaths. The spirometer may include an indicator to show your best effort. Use the indicator as a goal to work toward during each repetition. After each set of 10 deep breaths, practice coughing to be sure your lungs are clear. If you have an incision (the cut made at the time of surgery), support your incision when coughing by placing a pillow or rolled up towels firmly  against it. Once you are able to get out of bed, walk around indoors and cough well. You may stop using the incentive spirometer when instructed by your caregiver.  RISKS AND COMPLICATIONS Take your time so you do not get dizzy or light-headed. If you are in pain, you may need to take or ask for pain medication before doing incentive spirometry. It is harder to take a deep breath if you are having pain. AFTER USE Rest and breathe slowly and easily. It can be helpful to keep track of a log of your progress. Your caregiver can provide you with a simple table to help with this. If you are using the spirometer at home, follow these instructions: SEEK MEDICAL CARE IF:  You are having difficultly using the spirometer. You have trouble using the spirometer as often as instructed. Your pain medication is not giving enough relief while using the spirometer. You develop fever of 100.5 F (38.1 C) or higher. SEEK IMMEDIATE MEDICAL CARE IF:  You cough up bloody sputum that had not been present before. You develop fever of 102 F (38.9 C) or greater. You develop worsening pain at or near the incision site. MAKE SURE YOU:  Understand  these instructions. Will watch your condition. Will get help right away if you are not doing well or get worse. Document Released: 08/04/2006 Document Revised: 06/16/2011 Document Reviewed: 10/05/2006 Exit[employer] Patient Information 2014 [employer], [place].   ________________________________________________________________________ WHAT IS A BLOOD TRANSFUSION? Blood Transfusion Information  A transfusion is the replacement of blood or some of its parts. Blood is made up of multiple cells which provide different functions. Red blood cells carry oxygen  and are used for blood loss replacement. White blood cells fight against infection. Platelets control bleeding. Plasma helps clot blood. Other blood products are available for specialized needs, such as hemophilia or other clotting disorders. BEFORE THE TRANSFUSION  Who gives blood for transfusions?  Healthy volunteers who are fully evaluated to make sure their blood is safe. This is blood bank blood. Transfusion therapy is the safest it has ever been in the practice of medicine. Before blood is taken from a donor, a complete history is taken to make sure that person has no history of diseases nor engages in risky social behavior (examples are intravenous drug use or sexual activity with multiple partners). The donor's travel history is screened to minimize risk of transmitting infections, such as malaria. The donated blood is tested for signs of infectious diseases, such as HIV and hepatitis. The blood is then tested to be sure it is compatible with you in order to minimize the chance of a transfusion reaction. If you or a relative donates blood, this is often done in anticipation of surgery and is not appropriate for emergency situations. It takes many days to process the donated blood. RISKS AND COMPLICATIONS Although transfusion therapy is very safe and saves many lives, the main dangers of transfusion include:  Getting an infectious  disease. Developing a transfusion reaction. This is an allergic reaction to something in the blood you were given. Every precaution is taken to prevent this. The decision to have a blood transfusion has been considered carefully by your caregiver before blood is given. Blood is not given unless the benefits outweigh the risks. AFTER THE TRANSFUSION Right after receiving a blood transfusion, you will usually feel much better and more energetic. This is especially true if your red blood cells have gotten low (anemic). The transfusion raises the level of the red blood  cells which carry oxygen , and this usually causes an energy increase. The nurse administering the transfusion will monitor you carefully for complications. HOME CARE INSTRUCTIONS  No special instructions are needed after a transfusion. You may find your energy is better. Speak with your caregiver about any limitations on activity for underlying diseases you may have. SEEK MEDICAL CARE IF:  Your condition is not improving after your transfusion. You develop redness or irritation at the intravenous (IV) site. SEEK IMMEDIATE MEDICAL CARE IF:  Any of the following symptoms occur over the next 12 hours: Shaking chills. You have a temperature by mouth above 102 F (38.9 C), not controlled by medicine. Chest, back, or muscle pain. People around you feel you are not acting correctly or are confused. Shortness of breath or difficulty breathing. Dizziness and fainting. You get a rash or develop hives. You have a decrease in urine output. Your urine turns a dark color or changes to pink, red, or brown. Any of the following symptoms occur over the next 10 days: You have a temperature by mouth above 102 F (38.9 C), not controlled by medicine. Shortness of breath. Weakness after normal activity. The white part of the eye turns yellow (jaundice). You have a decrease in the amount of urine or are urinating less often. Your urine turns a  dark color or changes to pink, red, or brown. Document Released: 03/21/2000 Document Revised: 06/16/2011 Document Reviewed: 11/08/2007 [facility] Patient Information 2014 [place], [place].  _______________________________________________________________________

## 2024-02-08 ENCOUNTER — Ambulatory Visit: Payer: Self-pay | Admitting: Emergency Medicine

## 2024-02-08 ENCOUNTER — Encounter: Payer: Self-pay | Admitting: Radiology

## 2024-02-08 DIAGNOSIS — G8929 Other chronic pain: Secondary | ICD-10-CM

## 2024-02-08 NOTE — H&P (Signed)
 TOTAL HIP ADMISSION H&P  Patient is admitted for left total hip arthroplasty.  Subjective:  Chief Complaint: left hip pain  HPI: [name], 73 y.o. female, has a history of pain and functional disability in the left hip(s) due to arthritis and patient has failed non-surgical conservative treatments for greater than 12 weeks to include NSAID's and/or analgesics, use of assistive devices, and activity modification.  Onset of symptoms was gradual starting 1 years ago with gradually worsening course since that time.The patient noted no past surgery on the left hip(s).  Patient currently rates pain in the left hip at 9 out of 10 with activity. Patient has night pain, worsening of pain with activity and weight bearing, pain that interfers with activities of daily living, and pain with passive range of motion. Patient has evidence of periarticular osteophytes and joint space narrowing by imaging studies. This condition presents safety issues increasing the risk of falls.  There is no current active infection.  Patient Active Problem List   Diagnosis Date Noted   Multiple thyroid  nodules 11/05/2023   Trigger finger, left middle finger 05/27/2023   Closed fracture of humerus, greater tuberosity 04/29/2023   PVC (premature ventricular contraction) 11/05/2022   Hyperthyroidism 05/13/2022   Surgical follow-up care 01/06/2022   Encounter for postoperative wound check 01/06/2022   S/P CABG x 3 11/14/2021   Chest pain at rest 11/06/2021   Myopathy, unspecified 10/18/2021   Everitt Curt disease (radial styloid tenosynovitis) 09/26/2021   NSTEMI (non-ST elevated myocardial infarction) (HCC) 08/22/2021   Nausea vomiting and diarrhea 08/22/2021   Pneumonia 08/22/2021   Chronic systolic CHF (congestive heart failure) (HCC)    Abdominal cramping    History of atherosclerotic heart disease 07/15/2021   Coronary artery disease involving native coronary artery of native heart without angina pectoris  04/19/2021   Abnormal nuclear cardiac imaging test 03/08/2021   Diabetic peripheral neuropathy (HCC) 12/06/2020   Anxiety disorder 01/27/2020   HNP (herniated nucleus pulposus), lumbar 01/13/2020   PTSD (post-traumatic stress disorder)    Polyneuropathy associated with underlying disease 07/26/2019   Allergic rhinitis 07/26/2019   Atherosclerosis of aorta 04/04/2019   Myalgia due to statin 11/18/2017   Depression, major, recurrent, in partial remission 10/16/2017   Insomnia 10/16/2017   Chronic pain syndrome 09/07/2017   Spinal stenosis of lumbar region 07/08/2017   DOE (dyspnea on exertion) 12/16/2016   Family history of early CAD 12/16/2016   Arthritis of left acromioclavicular joint 08/13/2016   Left shoulder pain 05/12/2016   Cervical spondylosis without myelopathy 05/12/2016   Rotator cuff syndrome, left 05/12/2016   RLS (restless legs syndrome) 05/12/2016   Primary osteoarthritis, left hand 05/12/2016   MELENA 10/18/2008   GASTROPARESIS 05/30/2008   Type 2 diabetes mellitus with neurological complications (HCC) 05/04/2008   HYPERCHOLESTEROLEMIA 05/04/2008   HYPOKALEMIA 05/04/2008   GERD 05/04/2008   Constipation 05/04/2008   HEMATEMESIS 05/04/2008   Essential hypertension 05/04/2008   Past Medical History:  Diagnosis Date   Adrenal gland cyst    Anxiety    Arthritis    CHF (congestive heart failure) (HCC)    Diabetes mellitus without complication (HCC)    GERD (gastroesophageal reflux disease)    Heart murmur    Hyperlipidemia    Hypertension    Neuromuscular disorder (HCC)    Palpitations    Pneumonia    PONV (postoperative nausea and vomiting)    PTSD (post-traumatic stress disorder)    Ringing of ears, left     Past  Surgical History:  Procedure Laterality Date   ABDOMINAL HYSTERECTOMY     partial   ACHILLES TENDON SURGERY Left    APPENDECTOMY     BACK SURGERY     BLADDER SUSPENSION     BREAST SURGERY Right 2003   milk duct removed   CARDIAC  CATHETERIZATION     CHOLECYSTECTOMY     CORONARY ARTERY BYPASS GRAFT N/A 11/14/2021   Procedure: CORONARY ARTERY BYPASS GRAFTING (CABG) TIMES THREE USING LIMA AND CRYO VEIN.;  Surgeon: Obadiah Coy, MD;  Location: [facility] OR;  Service: Open Heart Surgery;  Laterality: N/A;   FRACTURE SURGERY     collar bone   LEFT HEART CATH AND CORONARY ANGIOGRAPHY N/A 03/08/2021   Procedure: LEFT HEART CATH AND CORONARY ANGIOGRAPHY;  Surgeon: Jordan, Peter M, MD;  Location: [facility] INVASIVE CV LAB;  Service: Cardiovascular;  Laterality: N/A;   NOSE SURGERY     x 2    OTHER SURGICAL HISTORY     Breast Duct removed   PARTIAL HYSTERECTOMY     RECTOCELE REPAIR     TEE WITHOUT CARDIOVERSION N/A 11/14/2021   Procedure: TRANSESOPHAGEAL ECHOCARDIOGRAM (TEE);  Surgeon: Obadiah Coy, MD;  Location: [facility] OR;  Service: Open Heart Surgery;  Laterality: N/A;   TONSILLECTOMY     VASCULAR SURGERY Right    right leg   VEIN SURGERY Right    leg    Current Outpatient Medications  Medication Sig Dispense Refill Last Dose/Taking   Alcohol Swabs (B-D SINGLE USE SWABS REGULAR) PADS Use to test blood sugar 3 times daily 100 each 3    Blood Glucose Monitoring Suppl (ONETOUCH VERIO FLEX SYSTEM) w/Device KIT Use to test blood sugars 1-2 times daily. 1 kit 0    clonazePAM  (KLONOPIN ) 1 MG tablet Take 1 tablet (1 mg total) by mouth 2 (two) times daily as needed. for anxiety 60 tablet 3    clopidogrel  (PLAVIX ) 75 MG tablet Take 1 tablet (75 mg total) by mouth daily. 90 tablet 3    docusate sodium  (COLACE) 100 MG capsule Take 200 mg by mouth 3 (three) times daily as needed for mild constipation.      Dulaglutide  (TRULICITY ) 1.5 MG/0.5ML SOAJ Inject 1.5 mg into the skin once a week. Getting through PAP      Evolocumab  (REPATHA ) 140 MG/ML SOSY Inject 140 mg into the skin every 21 ( twenty-one) days.      ezetimibe  (ZETIA ) 10 MG tablet Take 1 tablet (10 mg total) by mouth daily. 90 tablet 3    fluticasone  (FLONASE ) 50 MCG/ACT nasal spray  Place 2 sprays into both nostrils daily. (Patient taking differently: Place 2 sprays into both nostrils daily as needed for allergies.) 16 g 11    furosemide  (LASIX ) 20 MG tablet TAKE 1 TABLET BY MOUTH ONCE DAILY AS NEEDED 30 tablet 0    gabapentin  (NEURONTIN ) 600 MG tablet TAKE 1 TABLET BY MOUTH EVERY MORNING AND AT NOON AND EVERY EVENING AND AT BEDTIME 120 tablet 3    guaiFENesin  (MUCINEX ) 600 MG 12 hr tablet Take 600 mg by mouth daily.      HYDROcodone -acetaminophen  (NORCO) 10-325 MG tablet Take 1 tablet by mouth every 6 (six) hours as needed. 120 tablet 0    JARDIANCE  10 MG TABS tablet Take 1 tablet by mouth once daily 90 tablet 0    Lancets (ONETOUCH ULTRASOFT) lancets Use to test blood sugar 1-2 times daily. 100 each 12    MAGNESIUM  GLYCINATE PO Take 1 tablet by mouth daily.  Melatonin 10 MG CHEW Chew 20 mg by mouth at bedtime.      metoprolol  succinate (TOPROL -XL) 100 MG 24 hr tablet Take 1 tablet (100 mg total) by mouth 2 (two) times daily. 90 tablet 3    ondansetron  (ZOFRAN -ODT) 4 MG disintegrating tablet DISSOLVE 1 TABLET IN MOUTH ONCE DAILY AS NEEDED FOR NAUSEA FOR VOMITING 20 tablet 0    pantoprazole  (PROTONIX ) 40 MG tablet Take 1 tablet by mouth once daily 90 tablet 3    Polyethyl Glycol-Propyl Glycol (SYSTANE) 0.4-0.3 % SOLN Place 1 drop into both eyes daily as needed (Dry eye).      potassium chloride  SA (KLOR-CON  M) 20 MEQ tablet Take 1 tablet by mouth once daily 30 tablet 0    QUEtiapine  (SEROQUEL ) 100 MG tablet TAKE 1/2 (ONE-HALF) TABLET BY MOUTH AT BEDTIME 45 tablet 3    rOPINIRole  (REQUIP ) 0.5 MG tablet Take 1 tablet (0.5 mg total) by mouth at bedtime. 90 tablet 1    sacubitril -valsartan  (ENTRESTO ) 24-26 MG Take 1 tablet by mouth 2 (two) times daily. 60 tablet 11    VENTOLIN  HFA 108 (90 Base) MCG/ACT inhaler INHALE 2 PUFFS BY MOUTH EVERY 6 HOURS AS NEEDED FOR WHEEZING FOR SHORTNESS OF BREATH 18 g 0    No current facility-administered medications for this visit.    Allergies  Allergen Reactions   Hydromorphone  Nausea And Vomiting and Other (See Comments)    Many years, high-dose made the patient feel spaced out   Morphine  And Codeine Itching    With high doses   Statins     Hx intolerance to multiple statins   Penicillins Hives and Rash    Social History   Tobacco Use   Smoking status: Never   Smokeless tobacco: Never   Tobacco comments:    Never smoke 11/01/21  Substance Use Topics   Alcohol use: Never    Family History  Problem Relation Age of Onset   Diabetes Mother    Alzheimer's disease Mother    Heart disease Mother    Lung cancer Father    Esophageal cancer Brother    Colon cancer Neg Hx      Review of Systems  Musculoskeletal:  Positive for arthralgias.  All other systems reviewed and are negative.   Objective:  Physical Exam Constitutional:      General: She is not in acute distress.    Appearance: Normal appearance. She is not ill-appearing.  HENT:     Head: Normocephalic and atraumatic.     Right Ear: External ear normal.     Left Ear: External ear normal.     Nose: Nose normal.     Mouth/Throat:     Mouth: Mucous membranes are moist.     Pharynx: Oropharynx is clear.  Eyes:     Extraocular Movements: Extraocular movements intact.     Conjunctiva/sclera: Conjunctivae normal.  Cardiovascular:     Rate and Rhythm: Normal rate.     Pulses: Normal pulses.  Pulmonary:     Effort: Pulmonary effort is normal.  Abdominal:     General: Bowel sounds are normal.     Palpations: Abdomen is soft.  Musculoskeletal:        General: Tenderness present.     Cervical back: Normal range of motion and neck supple.     Comments: TTP over groin, lateral aspect, greater trochanter.  Mild IT band tenderness.  No significant swelling.  No overlying lesions of area of chief complaint.  Decreased strength and  ROM due to elicited pain.  Dorsiflexion and plantarflexion intact.  BLE appear grossly neurovascularly intact.  Gait  mildly antalgic.   Skin:    General: Skin is warm and dry.  Neurological:     Mental Status: She is alert and oriented to person, place, and time. Mental status is at baseline.  Psychiatric:        Mood and Affect: Mood normal.        Behavior: Behavior normal.     Vital signs in last 24 hours: @VSRANGES @  Labs:   Estimated body mass index is 27.06 kg/m as calculated from the following:   Height as of 01/15/24: 5' 8 (1.727 m).   Weight as of 01/15/24: 80.7 kg.   Imaging Review Plain radiographs demonstrate severe degenerative joint disease of the left hip(s). The bone quality appears to be fair for age and reported activity level.      Assessment/Plan:  End stage arthritis, left hip(s)  The patient history, physical examination, clinical judgement of the provider and imaging studies are consistent with end stage degenerative joint disease of the left hip(s) and total hip arthroplasty is deemed medically necessary. The treatment options including medical management, injection therapy, arthroscopy and arthroplasty were discussed at length. The risks and benefits of total hip arthroplasty were presented and reviewed. The risks due to aseptic loosening, infection, stiffness, dislocation/subluxation,  thromboembolic complications and other imponderables were discussed.  The patient acknowledged the explanation, agreed to proceed with the plan and consent was signed. Patient is being admitted for inpatient treatment for surgery, pain control, PT, OT, prophylactic antibiotics, VTE prophylaxis, progressive ambulation and ADL's and discharge planning.The patient is planning to be discharged home with OPPT   Anticipated LOS equal to or greater than 2 midnights due to - Age 41 and older with one or more of the following:  - Obesity  - Expected need for hospital services (PT, OT, Nursing) required for safe  discharge  - Anticipated need for postoperative skilled nursing care or  inpatient rehab  - Active co-morbidities: CAD, diabetes, HTN, HLD, GERD, neuropathy, Afib (transient), hx of CABG, anxiety, depression, insomnia, CHF, neuromuscular disorder, PONV, PTSD, ischemic cardiomyopathy OR   - Unanticipated findings during/Post Surgery: None  - Patient is a high risk of re-admission due to: None

## 2024-02-08 NOTE — H&P (View-Only) (Signed)
 TOTAL HIP ADMISSION H&P  Patient is admitted for left total hip arthroplasty.  Subjective:  Chief Complaint: left hip pain  HPI: [name], 73 y.o. female, has a history of pain and functional disability in the left hip(s) due to arthritis and patient has failed non-surgical conservative treatments for greater than 12 weeks to include NSAID's and/or analgesics, use of assistive devices, and activity modification.  Onset of symptoms was gradual starting 1 years ago with gradually worsening course since that time.The patient noted no past surgery on the left hip(s).  Patient currently rates pain in the left hip at 9 out of 10 with activity. Patient has night pain, worsening of pain with activity and weight bearing, pain that interfers with activities of daily living, and pain with passive range of motion. Patient has evidence of periarticular osteophytes and joint space narrowing by imaging studies. This condition presents safety issues increasing the risk of falls.  There is no current active infection.  Patient Active Problem List   Diagnosis Date Noted   Multiple thyroid  nodules 11/05/2023   Trigger finger, left middle finger 05/27/2023   Closed fracture of humerus, greater tuberosity 04/29/2023   PVC (premature ventricular contraction) 11/05/2022   Hyperthyroidism 05/13/2022   Surgical follow-up care 01/06/2022   Encounter for postoperative wound check 01/06/2022   S/P CABG x 3 11/14/2021   Chest pain at rest 11/06/2021   Myopathy, unspecified 10/18/2021   Everitt Curt disease (radial styloid tenosynovitis) 09/26/2021   NSTEMI (non-ST elevated myocardial infarction) (HCC) 08/22/2021   Nausea vomiting and diarrhea 08/22/2021   Pneumonia 08/22/2021   Chronic systolic CHF (congestive heart failure) (HCC)    Abdominal cramping    History of atherosclerotic heart disease 07/15/2021   Coronary artery disease involving native coronary artery of native heart without angina pectoris  04/19/2021   Abnormal nuclear cardiac imaging test 03/08/2021   Diabetic peripheral neuropathy (HCC) 12/06/2020   Anxiety disorder 01/27/2020   HNP (herniated nucleus pulposus), lumbar 01/13/2020   PTSD (post-traumatic stress disorder)    Polyneuropathy associated with underlying disease 07/26/2019   Allergic rhinitis 07/26/2019   Atherosclerosis of aorta 04/04/2019   Myalgia due to statin 11/18/2017   Depression, major, recurrent, in partial remission 10/16/2017   Insomnia 10/16/2017   Chronic pain syndrome 09/07/2017   Spinal stenosis of lumbar region 07/08/2017   DOE (dyspnea on exertion) 12/16/2016   Family history of early CAD 12/16/2016   Arthritis of left acromioclavicular joint 08/13/2016   Left shoulder pain 05/12/2016   Cervical spondylosis without myelopathy 05/12/2016   Rotator cuff syndrome, left 05/12/2016   RLS (restless legs syndrome) 05/12/2016   Primary osteoarthritis, left hand 05/12/2016   MELENA 10/18/2008   GASTROPARESIS 05/30/2008   Type 2 diabetes mellitus with neurological complications (HCC) 05/04/2008   HYPERCHOLESTEROLEMIA 05/04/2008   HYPOKALEMIA 05/04/2008   GERD 05/04/2008   Constipation 05/04/2008   HEMATEMESIS 05/04/2008   Essential hypertension 05/04/2008   Past Medical History:  Diagnosis Date   Adrenal gland cyst    Anxiety    Arthritis    CHF (congestive heart failure) (HCC)    Diabetes mellitus without complication (HCC)    GERD (gastroesophageal reflux disease)    Heart murmur    Hyperlipidemia    Hypertension    Neuromuscular disorder (HCC)    Palpitations    Pneumonia    PONV (postoperative nausea and vomiting)    PTSD (post-traumatic stress disorder)    Ringing of ears, left     Past  Surgical History:  Procedure Laterality Date   ABDOMINAL HYSTERECTOMY     partial   ACHILLES TENDON SURGERY Left    APPENDECTOMY     BACK SURGERY     BLADDER SUSPENSION     BREAST SURGERY Right 2003   milk duct removed   CARDIAC  CATHETERIZATION     CHOLECYSTECTOMY     CORONARY ARTERY BYPASS GRAFT N/A 11/14/2021   Procedure: CORONARY ARTERY BYPASS GRAFTING (CABG) TIMES THREE USING LIMA AND CRYO VEIN.;  Surgeon: Obadiah Coy, MD;  Location: [facility] OR;  Service: Open Heart Surgery;  Laterality: N/A;   FRACTURE SURGERY     collar bone   LEFT HEART CATH AND CORONARY ANGIOGRAPHY N/A 03/08/2021   Procedure: LEFT HEART CATH AND CORONARY ANGIOGRAPHY;  Surgeon: Jordan, Peter M, MD;  Location: [facility] INVASIVE CV LAB;  Service: Cardiovascular;  Laterality: N/A;   NOSE SURGERY     x 2    OTHER SURGICAL HISTORY     Breast Duct removed   PARTIAL HYSTERECTOMY     RECTOCELE REPAIR     TEE WITHOUT CARDIOVERSION N/A 11/14/2021   Procedure: TRANSESOPHAGEAL ECHOCARDIOGRAM (TEE);  Surgeon: Obadiah Coy, MD;  Location: [facility] OR;  Service: Open Heart Surgery;  Laterality: N/A;   TONSILLECTOMY     VASCULAR SURGERY Right    right leg   VEIN SURGERY Right    leg    Current Outpatient Medications  Medication Sig Dispense Refill Last Dose/Taking   Alcohol Swabs (B-D SINGLE USE SWABS REGULAR) PADS Use to test blood sugar 3 times daily 100 each 3    Blood Glucose Monitoring Suppl (ONETOUCH VERIO FLEX SYSTEM) w/Device KIT Use to test blood sugars 1-2 times daily. 1 kit 0    clonazePAM  (KLONOPIN ) 1 MG tablet Take 1 tablet (1 mg total) by mouth 2 (two) times daily as needed. for anxiety 60 tablet 3    clopidogrel  (PLAVIX ) 75 MG tablet Take 1 tablet (75 mg total) by mouth daily. 90 tablet 3    docusate sodium  (COLACE) 100 MG capsule Take 200 mg by mouth 3 (three) times daily as needed for mild constipation.      Dulaglutide  (TRULICITY ) 1.5 MG/0.5ML SOAJ Inject 1.5 mg into the skin once a week. Getting through PAP      Evolocumab  (REPATHA ) 140 MG/ML SOSY Inject 140 mg into the skin every 21 ( twenty-one) days.      ezetimibe  (ZETIA ) 10 MG tablet Take 1 tablet (10 mg total) by mouth daily. 90 tablet 3    fluticasone  (FLONASE ) 50 MCG/ACT nasal spray  Place 2 sprays into both nostrils daily. (Patient taking differently: Place 2 sprays into both nostrils daily as needed for allergies.) 16 g 11    furosemide  (LASIX ) 20 MG tablet TAKE 1 TABLET BY MOUTH ONCE DAILY AS NEEDED 30 tablet 0    gabapentin  (NEURONTIN ) 600 MG tablet TAKE 1 TABLET BY MOUTH EVERY MORNING AND AT NOON AND EVERY EVENING AND AT BEDTIME 120 tablet 3    guaiFENesin  (MUCINEX ) 600 MG 12 hr tablet Take 600 mg by mouth daily.      HYDROcodone -acetaminophen  (NORCO) 10-325 MG tablet Take 1 tablet by mouth every 6 (six) hours as needed. 120 tablet 0    JARDIANCE  10 MG TABS tablet Take 1 tablet by mouth once daily 90 tablet 0    Lancets (ONETOUCH ULTRASOFT) lancets Use to test blood sugar 1-2 times daily. 100 each 12    MAGNESIUM  GLYCINATE PO Take 1 tablet by mouth daily.  Melatonin 10 MG CHEW Chew 20 mg by mouth at bedtime.      metoprolol  succinate (TOPROL -XL) 100 MG 24 hr tablet Take 1 tablet (100 mg total) by mouth 2 (two) times daily. 90 tablet 3    ondansetron  (ZOFRAN -ODT) 4 MG disintegrating tablet DISSOLVE 1 TABLET IN MOUTH ONCE DAILY AS NEEDED FOR NAUSEA FOR VOMITING 20 tablet 0    pantoprazole  (PROTONIX ) 40 MG tablet Take 1 tablet by mouth once daily 90 tablet 3    Polyethyl Glycol-Propyl Glycol (SYSTANE) 0.4-0.3 % SOLN Place 1 drop into both eyes daily as needed (Dry eye).      potassium chloride  SA (KLOR-CON  M) 20 MEQ tablet Take 1 tablet by mouth once daily 30 tablet 0    QUEtiapine  (SEROQUEL ) 100 MG tablet TAKE 1/2 (ONE-HALF) TABLET BY MOUTH AT BEDTIME 45 tablet 3    rOPINIRole  (REQUIP ) 0.5 MG tablet Take 1 tablet (0.5 mg total) by mouth at bedtime. 90 tablet 1    sacubitril -valsartan  (ENTRESTO ) 24-26 MG Take 1 tablet by mouth 2 (two) times daily. 60 tablet 11    VENTOLIN  HFA 108 (90 Base) MCG/ACT inhaler INHALE 2 PUFFS BY MOUTH EVERY 6 HOURS AS NEEDED FOR WHEEZING FOR SHORTNESS OF BREATH 18 g 0    No current facility-administered medications for this visit.    Allergies  Allergen Reactions   Hydromorphone  Nausea And Vomiting and Other (See Comments)    Many years, high-dose made the patient feel spaced out   Morphine  And Codeine Itching    With high doses   Statins     Hx intolerance to multiple statins   Penicillins Hives and Rash    Social History   Tobacco Use   Smoking status: Never   Smokeless tobacco: Never   Tobacco comments:    Never smoke 11/01/21  Substance Use Topics   Alcohol use: Never    Family History  Problem Relation Age of Onset   Diabetes Mother    Alzheimer's disease Mother    Heart disease Mother    Lung cancer Father    Esophageal cancer Brother    Colon cancer Neg Hx      Review of Systems  Musculoskeletal:  Positive for arthralgias.  All other systems reviewed and are negative.   Objective:  Physical Exam Constitutional:      General: She is not in acute distress.    Appearance: Normal appearance. She is not ill-appearing.  HENT:     Head: Normocephalic and atraumatic.     Right Ear: External ear normal.     Left Ear: External ear normal.     Nose: Nose normal.     Mouth/Throat:     Mouth: Mucous membranes are moist.     Pharynx: Oropharynx is clear.  Eyes:     Extraocular Movements: Extraocular movements intact.     Conjunctiva/sclera: Conjunctivae normal.  Cardiovascular:     Rate and Rhythm: Normal rate.     Pulses: Normal pulses.  Pulmonary:     Effort: Pulmonary effort is normal.  Abdominal:     General: Bowel sounds are normal.     Palpations: Abdomen is soft.  Musculoskeletal:        General: Tenderness present.     Cervical back: Normal range of motion and neck supple.     Comments: TTP over groin, lateral aspect, greater trochanter.  Mild IT band tenderness.  No significant swelling.  No overlying lesions of area of chief complaint.  Decreased strength and  ROM due to elicited pain.  Dorsiflexion and plantarflexion intact.  BLE appear grossly neurovascularly intact.  Gait  mildly antalgic.   Skin:    General: Skin is warm and dry.  Neurological:     Mental Status: She is alert and oriented to person, place, and time. Mental status is at baseline.  Psychiatric:        Mood and Affect: Mood normal.        Behavior: Behavior normal.     Vital signs in last 24 hours: @VSRANGES @  Labs:   Estimated body mass index is 27.06 kg/m as calculated from the following:   Height as of 01/15/24: 5' 8 (1.727 m).   Weight as of 01/15/24: 80.7 kg.   Imaging Review Plain radiographs demonstrate severe degenerative joint disease of the left hip(s). The bone quality appears to be fair for age and reported activity level.      Assessment/Plan:  End stage arthritis, left hip(s)  The patient history, physical examination, clinical judgement of the provider and imaging studies are consistent with end stage degenerative joint disease of the left hip(s) and total hip arthroplasty is deemed medically necessary. The treatment options including medical management, injection therapy, arthroscopy and arthroplasty were discussed at length. The risks and benefits of total hip arthroplasty were presented and reviewed. The risks due to aseptic loosening, infection, stiffness, dislocation/subluxation,  thromboembolic complications and other imponderables were discussed.  The patient acknowledged the explanation, agreed to proceed with the plan and consent was signed. Patient is being admitted for inpatient treatment for surgery, pain control, PT, OT, prophylactic antibiotics, VTE prophylaxis, progressive ambulation and ADL's and discharge planning.The patient is planning to be discharged home with OPPT   Anticipated LOS equal to or greater than 2 midnights due to - Age 41 and older with one or more of the following:  - Obesity  - Expected need for hospital services (PT, OT, Nursing) required for safe  discharge  - Anticipated need for postoperative skilled nursing care or  inpatient rehab  - Active co-morbidities: CAD, diabetes, HTN, HLD, GERD, neuropathy, Afib (transient), hx of CABG, anxiety, depression, insomnia, CHF, neuromuscular disorder, PONV, PTSD, ischemic cardiomyopathy OR   - Unanticipated findings during/Post Surgery: None  - Patient is a high risk of re-admission due to: None

## 2024-02-09 ENCOUNTER — Telehealth: Payer: Self-pay | Admitting: Family Medicine

## 2024-02-09 ENCOUNTER — Encounter (HOSPITAL_COMMUNITY)
Admission: RE | Admit: 2024-02-09 | Discharge: 2024-02-09 | Disposition: A | Discharge status: Home / Self Care | Source: Ambulatory Visit | Attending: Orthopedic Surgery | Admitting: Orthopedic Surgery

## 2024-02-09 ENCOUNTER — Encounter (HOSPITAL_COMMUNITY): Payer: Self-pay

## 2024-02-09 ENCOUNTER — Other Ambulatory Visit: Payer: Self-pay

## 2024-02-09 ENCOUNTER — Telehealth: Payer: Self-pay

## 2024-02-09 ENCOUNTER — Other Ambulatory Visit: Payer: Self-pay | Admitting: Family Medicine

## 2024-02-09 VITALS — BP 129/60 | HR 77 | Temp 98.0°F | Resp 16 | Ht 68.0 in | Wt 175.0 lb

## 2024-02-09 DIAGNOSIS — M25552 Pain in left hip: Secondary | ICD-10-CM | POA: Diagnosis not present

## 2024-02-09 DIAGNOSIS — M1612 Unilateral primary osteoarthritis, left hip: Secondary | ICD-10-CM | POA: Insufficient documentation

## 2024-02-09 DIAGNOSIS — Z01812 Encounter for preprocedural laboratory examination: Secondary | ICD-10-CM | POA: Diagnosis not present

## 2024-02-09 DIAGNOSIS — E1149 Type 2 diabetes mellitus with other diabetic neurological complication: Secondary | ICD-10-CM

## 2024-02-09 DIAGNOSIS — G8929 Other chronic pain: Secondary | ICD-10-CM | POA: Insufficient documentation

## 2024-02-09 DIAGNOSIS — I255 Ischemic cardiomyopathy: Secondary | ICD-10-CM | POA: Insufficient documentation

## 2024-02-09 DIAGNOSIS — Z951 Presence of aortocoronary bypass graft: Secondary | ICD-10-CM | POA: Insufficient documentation

## 2024-02-09 DIAGNOSIS — I10 Essential (primary) hypertension: Secondary | ICD-10-CM | POA: Insufficient documentation

## 2024-02-09 DIAGNOSIS — E119 Type 2 diabetes mellitus without complications: Secondary | ICD-10-CM | POA: Diagnosis not present

## 2024-02-09 DIAGNOSIS — I2510 Atherosclerotic heart disease of native coronary artery without angina pectoris: Secondary | ICD-10-CM | POA: Insufficient documentation

## 2024-02-09 DIAGNOSIS — Z01818 Encounter for other preprocedural examination: Secondary | ICD-10-CM

## 2024-02-09 HISTORY — DX: Depression, unspecified: F32.A

## 2024-02-09 HISTORY — DX: Personal history of urinary calculi: Z87.442

## 2024-02-09 LAB — COMPREHENSIVE METABOLIC PANEL WITH GFR
ALT: 13 U/L (ref 0–44)
AST: 21 U/L (ref 15–41)
Albumin: 4.4 g/dL (ref 3.5–5.0)
Alkaline Phosphatase: 67 U/L (ref 38–126)
Anion gap: 10 (ref 5–15)
BUN: 18 mg/dL (ref 8–23)
CO2: 26 mmol/L (ref 22–32)
Calcium: 9.9 mg/dL (ref 8.9–10.3)
Chloride: 104 mmol/L (ref 98–111)
Creatinine, Ser: 1.13 mg/dL — ABNORMAL HIGH (ref 0.44–1.00)
GFR, Estimated: 51 mL/min — ABNORMAL LOW (ref >60)
Glucose, Bld: 156 mg/dL — ABNORMAL HIGH (ref 70–99)
Potassium: 5.2 mmol/L — ABNORMAL HIGH (ref 3.5–5.1)
Sodium: 140 mmol/L (ref 135–145)
Total Bilirubin: 0.6 mg/dL (ref 0.0–1.2)
Total Protein: 7.5 g/dL (ref 6.5–8.1)

## 2024-02-09 LAB — CBC WITH DIFFERENTIAL/PLATELET
Abs Immature Granulocytes: 0.02 K/uL (ref 0.00–0.07)
Basophils Absolute: 0 K/uL (ref 0.0–0.1)
Basophils Relative: 1 %
Eosinophils Absolute: 0 K/uL (ref 0.0–0.5)
Eosinophils Relative: 1 %
HCT: 43 % (ref 36.0–46.0)
Hemoglobin: 14.3 g/dL (ref 12.0–15.0)
Immature Granulocytes: 0 %
Lymphocytes Relative: 43 %
Lymphs Abs: 2.1 K/uL (ref 0.7–4.0)
MCH: 31 pg (ref 26.0–34.0)
MCHC: 33.3 g/dL (ref 30.0–36.0)
MCV: 93.3 fL (ref 80.0–100.0)
Monocytes Absolute: 1.2 K/uL — ABNORMAL HIGH (ref 0.1–1.0)
Monocytes Relative: 24 %
Neutro Abs: 1.5 K/uL — ABNORMAL LOW (ref 1.7–7.7)
Neutrophils Relative %: 31 %
Platelets: 210 K/uL (ref 150–400)
RBC: 4.61 MIL/uL (ref 3.87–5.11)
RDW: 11.9 % (ref 11.5–15.5)
WBC: 4.9 K/uL (ref 4.0–10.5)
nRBC: 0 % (ref 0.0–0.2)

## 2024-02-09 LAB — GLUCOSE, CAPILLARY: Glucose-Capillary: 148 mg/dL — ABNORMAL HIGH (ref 70–99)

## 2024-02-09 LAB — HEMOGLOBIN A1C
Hgb A1c MFr Bld: 5.6 % (ref 4.8–5.6)
Mean Plasma Glucose: 114.02 mg/dL

## 2024-02-09 MED ORDER — TRULICITY 0.75 MG/0.5ML ~~LOC~~ SOAJ: 0.7500 mg | SUBCUTANEOUS | 3 refills | Status: AC

## 2024-02-09 NOTE — Telephone Encounter (Signed)
 Patient Assistance Program Application for Trulicity  to be filled out--placed in dr's folder.  Please fax to [employer] at the fax number on forms upon completion.

## 2024-02-09 NOTE — Telephone Encounter (Signed)
 Yes! Are you able to place in my folder up front? I can take care of it when back in tmrw!

## 2024-02-09 NOTE — Telephone Encounter (Signed)
 Gave pt a call pt is coming up due for re-enrollment on [employer] Trulicity .spoke with pt explain she has dropped it off at provider office she stared filling in online but site was down she dropped off pt portion.

## 2024-02-10 DIAGNOSIS — H26492 Other secondary cataract, left eye: Secondary | ICD-10-CM | POA: Diagnosis not present

## 2024-02-10 DIAGNOSIS — M1612 Unilateral primary osteoarthritis, left hip: Secondary | ICD-10-CM | POA: Diagnosis not present

## 2024-02-10 DIAGNOSIS — Z951 Presence of aortocoronary bypass graft: Secondary | ICD-10-CM | POA: Diagnosis not present

## 2024-02-10 DIAGNOSIS — M25552 Pain in left hip: Secondary | ICD-10-CM | POA: Diagnosis not present

## 2024-02-10 DIAGNOSIS — E119 Type 2 diabetes mellitus without complications: Secondary | ICD-10-CM | POA: Diagnosis not present

## 2024-02-10 DIAGNOSIS — Z01812 Encounter for preprocedural laboratory examination: Secondary | ICD-10-CM | POA: Diagnosis not present

## 2024-02-10 DIAGNOSIS — I255 Ischemic cardiomyopathy: Secondary | ICD-10-CM | POA: Diagnosis not present

## 2024-02-10 DIAGNOSIS — I2510 Atherosclerotic heart disease of native coronary artery without angina pectoris: Secondary | ICD-10-CM | POA: Diagnosis not present

## 2024-02-10 DIAGNOSIS — G8929 Other chronic pain: Secondary | ICD-10-CM | POA: Diagnosis not present

## 2024-02-10 DIAGNOSIS — I10 Essential (primary) hypertension: Secondary | ICD-10-CM | POA: Diagnosis not present

## 2024-02-10 LAB — SURGICAL PCR SCREEN
MRSA, PCR: NEGATIVE
Staphylococcus aureus: NEGATIVE

## 2024-02-10 NOTE — Telephone Encounter (Signed)
 Submitted completed application to [employer] for trulicity  0.75mg , pending company response.

## 2024-02-10 NOTE — Telephone Encounter (Signed)
 Submitted completed application for Trulcity 0.75mg  to [employer], both the patient and provider portion, pending company response

## 2024-02-11 ENCOUNTER — Other Ambulatory Visit: Payer: Self-pay | Admitting: Family Medicine

## 2024-02-11 ENCOUNTER — Other Ambulatory Visit (HOSPITAL_COMMUNITY): Payer: Self-pay | Admitting: Cardiology

## 2024-02-11 DIAGNOSIS — I700 Atherosclerosis of aorta: Secondary | ICD-10-CM

## 2024-02-11 DIAGNOSIS — E1149 Type 2 diabetes mellitus with other diabetic neurological complication: Secondary | ICD-10-CM

## 2024-02-11 DIAGNOSIS — E785 Hyperlipidemia, unspecified: Secondary | ICD-10-CM

## 2024-02-11 DIAGNOSIS — I214 Non-ST elevation (NSTEMI) myocardial infarction: Secondary | ICD-10-CM

## 2024-02-11 DIAGNOSIS — I5022 Chronic systolic (congestive) heart failure: Secondary | ICD-10-CM

## 2024-02-11 DIAGNOSIS — G2581 Restless legs syndrome: Secondary | ICD-10-CM

## 2024-02-11 DIAGNOSIS — E876 Hypokalemia: Secondary | ICD-10-CM

## 2024-02-11 DIAGNOSIS — I2510 Atherosclerotic heart disease of native coronary artery without angina pectoris: Secondary | ICD-10-CM

## 2024-02-11 NOTE — Progress Notes (Signed)
 Anesthesia Chart Review   Case: [id number] Date/Time: 02/17/24 1130   Procedure: ARTHROPLASTY, HIP, TOTAL,POSTERIOR APPROACH (Left: Hip)   Anesthesia type: Spinal   Pre-op diagnosis: OA LEFT HIP   Location: [facility] ROOM 10 / [facility]   Surgeons: Edna Toribio LABOR, MD       DISCUSSION:73 y.o. never smoker with h/o PONV, HTN, DM II (A1C 5.4), CAD s/p CABG 2023, ischemic cardiomyopathy, left hip OA scheduled for above procedure 02/16/24 with Dr. Toribio Edna.   Pt last seen by cardiology 01/13/2024. Per OV note, She has minimal cardiopulmonary symptoms and EF is only mildly depressed. I think she is of reasonable risk to undergo surgery. She will need to hold Plavix  prior to surgery and restart afterwards. Continue Toprol  XL peri-operatively.   Last dose of Plavix  02/09/2024.   VS: BP 129/60   Pulse 77   Temp 36.7 C (Oral)   Resp 16   Ht 5' 8 (1.727 m)   Wt 79.4 kg   SpO2 100%   BMI 26.61 kg/m   PROVIDERS: Jordan, Betty G, MD is PCP   Cardiologist - Ezra Shuck, MD  LABS: Labs reviewed: Acceptable for surgery. (all labs ordered are listed, but only abnormal results are displayed)  Labs Reviewed  CBC WITH DIFFERENTIAL/PLATELET - Abnormal; Notable for the following components:      Result Value   Neutro Abs 1.5 (*)    Monocytes Absolute 1.2 (*)    All other components within normal limits  COMPREHENSIVE METABOLIC PANEL WITH GFR - Abnormal; Notable for the following components:   Potassium 5.2 (*)    Glucose, Bld 156 (*)    Creatinine, Ser 1.13 (*)    GFR, Estimated 51 (*)    All other components within normal limits  GLUCOSE, CAPILLARY - Abnormal; Notable for the following components:   Glucose-Capillary 148 (*)    All other components within normal limits  HEMOGLOBIN A1C  TYPE AND SCREEN     IMAGES:   EKG:   CV: Echo 06/04/23 1. Technically difficult study.   2. Left ventricular ejection fraction, by estimation, is 45 to 50%. The  left ventricle has  mildly decreased function. Left ventricular endocardial  border not optimally defined to evaluate regional wall motion. Left  ventricular diastolic parameters are  consistent with Grade I diastolic dysfunction (impaired relaxation).   3. Right ventricular systolic function is mildly reduced. The right  ventricular size is normal. Tricuspid regurgitation signal is inadequate  for assessing PA pressure.   4. The mitral valve is degenerative. No evidence of mitral valve  regurgitation. No evidence of mitral stenosis.   5. The aortic valve was not well visualized. Aortic valve regurgitation  is not visualized. No aortic stenosis is present.   6. The inferior vena cava is normal in size with greater than 50%  respiratory variability, suggesting right atrial pressure of 3 mmHg.  Past Medical History:  Diagnosis Date   Adrenal gland cyst    Anxiety    Arthritis    CHF (congestive heart failure) (HCC)    Depression    Diabetes mellitus without complication (HCC)    GERD (gastroesophageal reflux disease)    Heart murmur    History of kidney stones    Hyperlipidemia    Hypertension    Neuromuscular disorder (HCC)    Palpitations    Pneumonia    PONV (postoperative nausea and vomiting)    PTSD (post-traumatic stress disorder)    Ringing of ears, left  Past Surgical History:  Procedure Laterality Date   ABDOMINAL HYSTERECTOMY     partial   ACHILLES TENDON SURGERY Left    APPENDECTOMY     BACK SURGERY     BLADDER SUSPENSION     BREAST SURGERY Right 2003   milk duct removed   CARDIAC CATHETERIZATION     CHOLECYSTECTOMY     CORONARY ARTERY BYPASS GRAFT N/A 11/14/2021   Procedure: CORONARY ARTERY BYPASS GRAFTING (CABG) TIMES THREE USING LIMA AND CRYO VEIN.;  Surgeon: Obadiah Coy, MD;  Location: [facility];  Service: Open Heart Surgery;  Laterality: N/A;   FRACTURE SURGERY     collar bone   LEFT HEART CATH AND CORONARY ANGIOGRAPHY N/A 03/08/2021   Procedure: LEFT HEART CATH AND  CORONARY ANGIOGRAPHY;  Surgeon: Jordan, Peter M, MD;  Location: [facility] INVASIVE CV LAB;  Service: Cardiovascular;  Laterality: N/A;   NOSE SURGERY     x 2    OTHER SURGICAL HISTORY     Breast Duct removed   PARTIAL HYSTERECTOMY     RECTOCELE REPAIR     TEE WITHOUT CARDIOVERSION N/A 11/14/2021   Procedure: TRANSESOPHAGEAL ECHOCARDIOGRAM (TEE);  Surgeon: Obadiah Coy, MD;  Location: [facility] OR;  Service: Open Heart Surgery;  Laterality: N/A;   TONSILLECTOMY     VASCULAR SURGERY Right    right leg   VEIN SURGERY Right    leg    MEDICATIONS:  Alcohol Swabs (B-D SINGLE USE SWABS REGULAR) PADS   Blood Glucose Monitoring Suppl (ONETOUCH VERIO FLEX SYSTEM) w/Device KIT   clonazePAM  (KLONOPIN ) 1 MG tablet   clopidogrel  (PLAVIX ) 75 MG tablet   docusate sodium  (COLACE) 100 MG capsule   Dulaglutide  (TRULICITY ) 0.75 MG/0.5ML SOAJ   Evolocumab  (REPATHA ) 140 MG/ML SOSY   ezetimibe  (ZETIA ) 10 MG tablet   fluticasone  (FLONASE ) 50 MCG/ACT nasal spray   furosemide  (LASIX ) 20 MG tablet   gabapentin  (NEURONTIN ) 600 MG tablet   guaiFENesin  (MUCINEX ) 600 MG 12 hr tablet   HYDROcodone -acetaminophen  (NORCO) 10-325 MG tablet   JARDIANCE  10 MG TABS tablet   Lancets (ONETOUCH ULTRASOFT) lancets   MAGNESIUM  GLYCINATE PO   Melatonin 10 MG CHEW   metoprolol  succinate (TOPROL -XL) 100 MG 24 hr tablet   ondansetron  (ZOFRAN -ODT) 4 MG disintegrating tablet   pantoprazole  (PROTONIX ) 40 MG tablet   Polyethyl Glycol-Propyl Glycol (SYSTANE) 0.4-0.3 % SOLN   potassium chloride  SA (KLOR-CON  M) 20 MEQ tablet   QUEtiapine  (SEROQUEL ) 100 MG tablet   rOPINIRole  (REQUIP ) 0.5 MG tablet   sacubitril -valsartan  (ENTRESTO ) 24-26 MG   VENTOLIN  HFA 108 (90 Base) MCG/ACT inhaler   No current facility-administered medications for this encounter.    Harlene Hoots Ward, PA-C [facility] [phone]

## 2024-02-11 NOTE — Anesthesia Preprocedure Evaluation (Addendum)
 Anesthesia Evaluation  Patient identified by MRN, date of birth, ID band Patient awake    Reviewed: Allergy & Precautions, NPO status , Patient's Chart, lab work & pertinent test results  History of Anesthesia Complications (+) PONV and history of anesthetic complications  Airway Mallampati: III  TM Distance: >3 FB Neck ROM: Full    Dental no notable dental hx. (+) Dental Advisory Given, Teeth Intact   Pulmonary pneumonia   Pulmonary exam normal breath sounds clear to auscultation       Cardiovascular hypertension, Pt. on medications and Pt. on home beta blockers + CAD, + Past MI, +CHF and + DOE  + Valvular Problems/Murmurs AS  Rhythm:Regular Rate:Normal + Systolic murmurs Echo 06/04/23  1. Technically difficult study.   2. Left ventricular ejection fraction, by estimation, is 45 to 50%. The  left ventricle has mildly decreased function. Left ventricular endocardial  border not optimally defined to evaluate regional wall motion. Left  ventricular diastolic parameters are consistent with Grade I diastolic  dysfunction (impaired relaxation).   3. Right ventricular systolic function is mildly reduced. The right  ventricular size is normal. Tricuspid regurgitation signal is inadequate  for assessing PA pressure.   4. The mitral valve is degenerative. No evidence of mitral valve  regurgitation. No evidence of mitral stenosis.   5. The aortic valve was not well visualized. Aortic valve regurgitation  is not visualized. No aortic stenosis is present.   6. The inferior vena cava is normal in size with greater than 50%  respiratory variability, suggesting right atrial pressure of 3 mmHg.    Neuro/Psych  PSYCHIATRIC DISORDERS Anxiety Depression     Neuromuscular disease    GI/Hepatic Neg liver ROS,GERD  ,,  Endo/Other  diabetes, Type 2 Hyperthyroidism   Renal/GU Renal disease     Musculoskeletal  (+) Arthritis ,     Abdominal   Peds  Hematology negative hematology ROS (+)   Anesthesia Other Findings   Reproductive/Obstetrics                              Lab Results  Component Value Date   WBC 4.9 02/09/2024   HGB 14.3 02/09/2024   HCT 43.0 02/09/2024   MCV 93.3 02/09/2024   PLT 210 02/09/2024   Lab Results  Component Value Date   CREATININE 1.13 (H) 02/09/2024   BUN 18 02/09/2024   NA 140 02/09/2024   K 5.2 (H) 02/09/2024   CL 104 02/09/2024   CO2 26 02/09/2024    Anesthesia Physical Anesthesia Plan  ASA: 3  Anesthesia Plan: General   Post-op Pain Management: Tylenol  PO (pre-op)*   Induction: Intravenous  PONV Risk Score and Plan: 4 or greater and Dexamethasone , Ondansetron , Treatment may vary due to age or medical condition, Propofol  infusion and TIVA  Airway Management Planned: Oral ETT  Additional Equipment:   Intra-op Plan:   Post-operative Plan: Extubation in OR  Informed Consent: I have reviewed the patients History and Physical, chart, labs and discussed the procedure including the risks, benefits and alternatives for the proposed anesthesia with the patient or authorized representative who has indicated his/her understanding and acceptance.     Dental advisory given  Plan Discussed with: CRNA  Anesthesia Plan Comments: (See PAT note 02/09/24)         Anesthesia Quick Evaluation

## 2024-02-12 ENCOUNTER — Telehealth: Payer: Self-pay | Admitting: Registered Nurse

## 2024-02-12 DIAGNOSIS — G894 Chronic pain syndrome: Secondary | ICD-10-CM

## 2024-02-12 DIAGNOSIS — G8929 Other chronic pain: Secondary | ICD-10-CM

## 2024-02-12 DIAGNOSIS — M542 Cervicalgia: Secondary | ICD-10-CM

## 2024-02-12 MED ORDER — HYDROCODONE-ACETAMINOPHEN 10-325 MG PO TABS: 1.0000 | ORAL_TABLET | Freq: Four times a day (QID) | ORAL | 0 refills | Status: DC | PRN

## 2024-02-12 NOTE — Telephone Encounter (Signed)
 PMP was Reviewed.  Hydrocodone  prescription was sent to pharmacy. [name] is aware of the above via My-Chart

## 2024-02-15 ENCOUNTER — Telehealth: Payer: Self-pay

## 2024-02-15 ENCOUNTER — Other Ambulatory Visit: Payer: Self-pay | Admitting: Pharmacist

## 2024-02-15 MED ORDER — REPATHA SURECLICK 140 MG/ML ~~LOC~~ SOAJ: 1.0000 mL | SUBCUTANEOUS | 3 refills | Status: AC

## 2024-02-15 NOTE — Progress Notes (Signed)
   02/15/2024  Patient ID: [name], female   DOB: 1950-11-27, 73 y.o.   MRN: [id number]  Received notification from [facility] that patient has been APPROVED to continue to receive Trulicity  through 04/06/2025.  Jon VEAR Lindau, PharmD Clinical Pharmacist [phone]

## 2024-02-15 NOTE — Telephone Encounter (Signed)
 We need to repeat potassium before continuing with potassium supplementation, last one was 5.2. Lab Results  Component Value Date   NA 140 02/09/2024   CL 104 02/09/2024   K 5.2 (H) 02/09/2024   CO2 26 02/09/2024   BUN 18 02/09/2024   CREATININE 1.13 (H) 02/09/2024   GFRNONAA 51 (L) 02/09/2024   CALCIUM  9.9 02/09/2024   PHOS 4.4 09/04/2021   ALBUMIN  4.4 02/09/2024   GLUCOSE 156 (H) 02/09/2024    Thanks, BJ

## 2024-02-16 ENCOUNTER — Telehealth: Payer: Self-pay | Admitting: Pharmacy Technician

## 2024-02-16 NOTE — Telephone Encounter (Signed)
   Pharmacy Patient Advocate Encounter   Received notification from [employer] that prior authorization for repatha  is required/requested.   Insurance verification completed.   The patient is insured through [facility]   Per test claim: PA required; PA submitted to above mentioned insurance via Latent Key/confirmation #/EOC [id number] Status is pending

## 2024-02-17 ENCOUNTER — Encounter (HOSPITAL_COMMUNITY)
Admission: RE | Disposition: A | Discharge status: Home / Self Care | Payer: Self-pay | Source: Home / Self Care | Attending: Orthopedic Surgery

## 2024-02-17 ENCOUNTER — Encounter (HOSPITAL_COMMUNITY): Payer: Self-pay | Admitting: Orthopedic Surgery

## 2024-02-17 ENCOUNTER — Other Ambulatory Visit: Payer: Self-pay

## 2024-02-17 ENCOUNTER — Ambulatory Visit (HOSPITAL_COMMUNITY)

## 2024-02-17 ENCOUNTER — Ambulatory Visit (HOSPITAL_COMMUNITY): Payer: Self-pay | Admitting: Physician Assistant

## 2024-02-17 ENCOUNTER — Ambulatory Visit (HOSPITAL_BASED_OUTPATIENT_CLINIC_OR_DEPARTMENT_OTHER): Payer: Self-pay | Admitting: Anesthesiology

## 2024-02-17 ENCOUNTER — Observation Stay (HOSPITAL_COMMUNITY)

## 2024-02-17 ENCOUNTER — Observation Stay (HOSPITAL_COMMUNITY)
Admission: RE | Admit: 2024-02-17 | Discharge: 2024-02-22 | Disposition: A | Discharge status: Home / Self Care | Attending: Orthopedic Surgery | Admitting: Orthopedic Surgery

## 2024-02-17 DIAGNOSIS — I2510 Atherosclerotic heart disease of native coronary artery without angina pectoris: Secondary | ICD-10-CM

## 2024-02-17 DIAGNOSIS — M1612 Unilateral primary osteoarthritis, left hip: Secondary | ICD-10-CM

## 2024-02-17 DIAGNOSIS — Z4789 Encounter for other orthopedic aftercare: Secondary | ICD-10-CM | POA: Diagnosis not present

## 2024-02-17 DIAGNOSIS — Z79899 Other long term (current) drug therapy: Secondary | ICD-10-CM | POA: Insufficient documentation

## 2024-02-17 DIAGNOSIS — M25552 Pain in left hip: Secondary | ICD-10-CM | POA: Diagnosis not present

## 2024-02-17 DIAGNOSIS — Z01818 Encounter for other preprocedural examination: Principal | ICD-10-CM

## 2024-02-17 DIAGNOSIS — G8929 Other chronic pain: Secondary | ICD-10-CM

## 2024-02-17 DIAGNOSIS — E1140 Type 2 diabetes mellitus with diabetic neuropathy, unspecified: Secondary | ICD-10-CM | POA: Insufficient documentation

## 2024-02-17 DIAGNOSIS — I110 Hypertensive heart disease with heart failure: Secondary | ICD-10-CM

## 2024-02-17 DIAGNOSIS — Z96642 Presence of left artificial hip joint: Secondary | ICD-10-CM | POA: Diagnosis not present

## 2024-02-17 DIAGNOSIS — G894 Chronic pain syndrome: Secondary | ICD-10-CM

## 2024-02-17 DIAGNOSIS — I5022 Chronic systolic (congestive) heart failure: Secondary | ICD-10-CM

## 2024-02-17 DIAGNOSIS — M542 Cervicalgia: Secondary | ICD-10-CM

## 2024-02-17 DIAGNOSIS — Z471 Aftercare following joint replacement surgery: Secondary | ICD-10-CM | POA: Diagnosis not present

## 2024-02-17 DIAGNOSIS — E119 Type 2 diabetes mellitus without complications: Secondary | ICD-10-CM

## 2024-02-17 HISTORY — PX: TOTAL HIP ARTHROPLASTY: SHX124

## 2024-02-17 LAB — GLUCOSE, CAPILLARY
Glucose-Capillary: 120 mg/dL — ABNORMAL HIGH (ref 70–99)
Glucose-Capillary: 121 mg/dL — ABNORMAL HIGH (ref 70–99)
Glucose-Capillary: 151 mg/dL — ABNORMAL HIGH (ref 70–99)

## 2024-02-17 LAB — TYPE AND SCREEN
ABO/RH(D): A POS
Antibody Screen: NEGATIVE

## 2024-02-17 SURGERY — ARTHROPLASTY, HIP, TOTAL,POSTERIOR APPROACH
Anesthesia: General | Site: Hip | Laterality: Left

## 2024-02-17 MED ORDER — HYDROCODONE-ACETAMINOPHEN 10-325 MG PO TABS
1.0000 | ORAL_TABLET | Freq: Four times a day (QID) | ORAL | Status: DC | PRN
  Administered 2024-02-17: 1 via ORAL
  Filled 2024-02-17: qty 1

## 2024-02-17 MED ORDER — FENTANYL CITRATE (PF) 100 MCG/2ML IJ SOLN
INTRAMUSCULAR | Status: AC
  Filled 2024-02-17: qty 2

## 2024-02-17 MED ORDER — ROPINIROLE HCL 0.5 MG PO TABS
0.5000 mg | ORAL_TABLET | Freq: Every day | ORAL | Status: DC
  Administered 2024-02-17 – 2024-02-21 (×5): 0.5 mg via ORAL
  Filled 2024-02-17 (×5): qty 1

## 2024-02-17 MED ORDER — QUETIAPINE FUMARATE 50 MG PO TABS
50.0000 mg | ORAL_TABLET | Freq: Every day | ORAL | Status: DC
  Administered 2024-02-17 – 2024-02-21 (×5): 50 mg via ORAL
  Filled 2024-02-17 (×5): qty 1

## 2024-02-17 MED ORDER — CEFAZOLIN SODIUM-DEXTROSE 2-4 GM/100ML-% IV SOLN
2.0000 g | INTRAVENOUS | Status: AC
  Administered 2024-02-17: 2 g via INTRAVENOUS
  Filled 2024-02-17: qty 100

## 2024-02-17 MED ORDER — KETOROLAC TROMETHAMINE 15 MG/ML IJ SOLN
7.5000 mg | Freq: Four times a day (QID) | INTRAMUSCULAR | Status: AC
  Administered 2024-02-17 – 2024-02-18 (×4): 7.5 mg via INTRAVENOUS
  Filled 2024-02-17 (×4): qty 1

## 2024-02-17 MED ORDER — POLYETHYLENE GLYCOL 3350 17 G PO PACK: 17.0000 g | PACK | Freq: Every day | ORAL | Status: DC | PRN

## 2024-02-17 MED ORDER — SODIUM CHLORIDE 0.9 % IR SOLN
Status: DC | PRN
  Administered 2024-02-17: 3000 mL

## 2024-02-17 MED ORDER — METHOCARBAMOL 1000 MG/10ML IJ SOLN: 500.0000 mg | Freq: Four times a day (QID) | INTRAMUSCULAR | Status: DC | PRN

## 2024-02-17 MED ORDER — ONDANSETRON HCL 4 MG/2ML IJ SOLN
4.0000 mg | Freq: Four times a day (QID) | INTRAMUSCULAR | Status: DC | PRN
  Administered 2024-02-19 (×2): 4 mg via INTRAVENOUS
  Filled 2024-02-17 (×2): qty 2

## 2024-02-17 MED ORDER — BUPIVACAINE LIPOSOME 1.3 % IJ SUSP: 10.0000 mL | Freq: Once | INTRAMUSCULAR | Status: DC

## 2024-02-17 MED ORDER — CEFAZOLIN SODIUM-DEXTROSE 2-4 GM/100ML-% IV SOLN
2.0000 g | Freq: Four times a day (QID) | INTRAVENOUS | Status: AC
  Administered 2024-02-17 (×2): 2 g via INTRAVENOUS
  Filled 2024-02-17 (×2): qty 100

## 2024-02-17 MED ORDER — FLUTICASONE PROPIONATE 50 MCG/ACT NA SUSP: 2.0000 | Freq: Every day | NASAL | Status: DC | PRN

## 2024-02-17 MED ORDER — FUROSEMIDE 20 MG PO TABS: 20.0000 mg | ORAL_TABLET | Freq: Every day | ORAL | Status: DC | PRN

## 2024-02-17 MED ORDER — ROCURONIUM BROMIDE 10 MG/ML (PF) SYRINGE
PREFILLED_SYRINGE | INTRAVENOUS | Status: AC
  Filled 2024-02-17: qty 10

## 2024-02-17 MED ORDER — METOPROLOL SUCCINATE ER 50 MG PO TB24
100.0000 mg | ORAL_TABLET | Freq: Two times a day (BID) | ORAL | Status: DC
  Administered 2024-02-17 – 2024-02-22 (×7): 100 mg via ORAL
  Filled 2024-02-17 (×9): qty 2

## 2024-02-17 MED ORDER — POVIDONE-IODINE 10 % EX SWAB: 2.0000 | Freq: Once | CUTANEOUS | Status: DC

## 2024-02-17 MED ORDER — CLONAZEPAM 1 MG PO TABS
1.0000 mg | ORAL_TABLET | Freq: Two times a day (BID) | ORAL | Status: DC | PRN
  Administered 2024-02-17 – 2024-02-21 (×3): 1 mg via ORAL
  Filled 2024-02-17 (×3): qty 1

## 2024-02-17 MED ORDER — METHOCARBAMOL 500 MG PO TABS
500.0000 mg | ORAL_TABLET | Freq: Four times a day (QID) | ORAL | Status: DC | PRN
  Administered 2024-02-17 – 2024-02-21 (×6): 500 mg via ORAL
  Filled 2024-02-17 (×8): qty 1

## 2024-02-17 MED ORDER — PHENOL 1.4 % MT LIQD: 1.0000 | OROMUCOSAL | Status: DC | PRN

## 2024-02-17 MED ORDER — DROPERIDOL 2.5 MG/ML IJ SOLN: 0.6250 mg | Freq: Once | INTRAMUSCULAR | Status: DC | PRN

## 2024-02-17 MED ORDER — PROPOFOL 10 MG/ML IV BOLUS
INTRAVENOUS | Status: DC | PRN
  Administered 2024-02-17: 100 ug/kg/min via INTRAVENOUS
  Administered 2024-02-17: 100 mg via INTRAVENOUS

## 2024-02-17 MED ORDER — DOCUSATE SODIUM 100 MG PO CAPS
100.0000 mg | ORAL_CAPSULE | Freq: Two times a day (BID) | ORAL | Status: DC
  Administered 2024-02-18: 100 mg via ORAL
  Filled 2024-02-17 (×6): qty 1

## 2024-02-17 MED ORDER — DEXAMETHASONE SOD PHOSPHATE PF 10 MG/ML IJ SOLN: 4.0000 mg | Freq: Once | INTRAMUSCULAR | Status: DC

## 2024-02-17 MED ORDER — MIDAZOLAM HCL 2 MG/2ML IJ SOLN
INTRAMUSCULAR | Status: AC
  Filled 2024-02-17: qty 2

## 2024-02-17 MED ORDER — FENTANYL CITRATE (PF) 50 MCG/ML IJ SOSY
PREFILLED_SYRINGE | INTRAMUSCULAR | Status: AC
  Filled 2024-02-17: qty 2

## 2024-02-17 MED ORDER — LACTATED RINGERS IV SOLN: INTRAVENOUS | Status: DC

## 2024-02-17 MED ORDER — APIXABAN 2.5 MG PO TABS
2.5000 mg | ORAL_TABLET | Freq: Two times a day (BID) | ORAL | Status: DC
  Administered 2024-02-18 – 2024-02-22 (×8): 2.5 mg via ORAL
  Filled 2024-02-17 (×9): qty 1

## 2024-02-17 MED ORDER — ALBUTEROL SULFATE (2.5 MG/3ML) 0.083% IN NEBU: 2.5000 mg | INHALATION_SOLUTION | Freq: Four times a day (QID) | RESPIRATORY_TRACT | Status: DC | PRN

## 2024-02-17 MED ORDER — GABAPENTIN 300 MG PO CAPS
600.0000 mg | ORAL_CAPSULE | Freq: Three times a day (TID) | ORAL | Status: DC
  Administered 2024-02-17 – 2024-02-22 (×14): 600 mg via ORAL
  Filled 2024-02-17 (×14): qty 2

## 2024-02-17 MED ORDER — MELATONIN 5 MG PO TABS
10.0000 mg | ORAL_TABLET | Freq: Every evening | ORAL | Status: DC | PRN
  Administered 2024-02-17: 10 mg via ORAL
  Filled 2024-02-17: qty 2

## 2024-02-17 MED ORDER — BUPIVACAINE LIPOSOME 1.3 % IJ SUSP
INTRAMUSCULAR | Status: AC
  Filled 2024-02-17: qty 20

## 2024-02-17 MED ORDER — METHOCARBAMOL 500 MG PO TABS: 500.0000 mg | ORAL_TABLET | Freq: Three times a day (TID) | ORAL | 0 refills | Status: AC | PRN

## 2024-02-17 MED ORDER — WATER FOR IRRIGATION, STERILE IR SOLN
Status: DC | PRN
  Administered 2024-02-17: 2000 mL

## 2024-02-17 MED ORDER — FENTANYL CITRATE (PF) 50 MCG/ML IJ SOSY
25.0000 ug | PREFILLED_SYRINGE | INTRAMUSCULAR | Status: DC | PRN
  Administered 2024-02-17: 50 ug via INTRAVENOUS
  Administered 2024-02-17: 25 ug via INTRAVENOUS
  Administered 2024-02-17: 50 ug via INTRAVENOUS

## 2024-02-17 MED ORDER — SODIUM CHLORIDE 0.9 % IV SOLN: INTRAVENOUS | Status: DC

## 2024-02-17 MED ORDER — EMPAGLIFLOZIN 10 MG PO TABS
10.0000 mg | ORAL_TABLET | Freq: Every day | ORAL | Status: DC
  Administered 2024-02-18 – 2024-02-22 (×4): 10 mg via ORAL
  Filled 2024-02-17 (×5): qty 1

## 2024-02-17 MED ORDER — ACETAMINOPHEN 325 MG PO TABS: 325.0000 mg | ORAL_TABLET | Freq: Four times a day (QID) | ORAL | Status: DC | PRN

## 2024-02-17 MED ORDER — FENTANYL CITRATE (PF) 50 MCG/ML IJ SOSY
PREFILLED_SYRINGE | INTRAMUSCULAR | Status: AC
  Filled 2024-02-17: qty 1

## 2024-02-17 MED ORDER — MENTHOL 3 MG MT LOZG: 1.0000 | LOZENGE | OROMUCOSAL | Status: DC | PRN

## 2024-02-17 MED ORDER — SODIUM CHLORIDE (PF) 0.9 % IJ SOLN
INTRAMUSCULAR | Status: AC
  Filled 2024-02-17: qty 30

## 2024-02-17 MED ORDER — ONDANSETRON HCL 4 MG/2ML IJ SOLN
INTRAMUSCULAR | Status: DC | PRN
Start: 2024-02-17 — End: 2024-02-17
  Administered 2024-02-17: 4 mg via INTRAVENOUS

## 2024-02-17 MED ORDER — TRANEXAMIC ACID-NACL 1000-0.7 MG/100ML-% IV SOLN
1000.0000 mg | INTRAVENOUS | Status: AC
  Administered 2024-02-17: 1000 mg via INTRAVENOUS
  Filled 2024-02-17: qty 100

## 2024-02-17 MED ORDER — PROPOFOL 10 MG/ML IV BOLUS
INTRAVENOUS | Status: AC
  Filled 2024-02-17: qty 20

## 2024-02-17 MED ORDER — APIXABAN 2.5 MG PO TABS: 2.5000 mg | ORAL_TABLET | Freq: Two times a day (BID) | ORAL | 0 refills | Status: AC

## 2024-02-17 MED ORDER — PANTOPRAZOLE SODIUM 40 MG PO TBEC
40.0000 mg | DELAYED_RELEASE_TABLET | Freq: Every day | ORAL | Status: DC
  Administered 2024-02-18 – 2024-02-22 (×4): 40 mg via ORAL
  Filled 2024-02-17 (×6): qty 1

## 2024-02-17 MED ORDER — CHLORHEXIDINE GLUCONATE 0.12 % MT SOLN
15.0000 mL | Freq: Once | OROMUCOSAL | Status: AC
  Administered 2024-02-17: 15 mL via OROMUCOSAL

## 2024-02-17 MED ORDER — ISOPROPYL ALCOHOL 70 % SOLN
Status: DC | PRN
Start: 2024-02-17 — End: 2024-02-17
  Administered 2024-02-17: 1 via TOPICAL

## 2024-02-17 MED ORDER — ONDANSETRON HCL 4 MG PO TABS: 4.0000 mg | ORAL_TABLET | Freq: Three times a day (TID) | ORAL | 0 refills | Status: AC | PRN

## 2024-02-17 MED ORDER — SACUBITRIL-VALSARTAN 24-26 MG PO TABS
1.0000 | ORAL_TABLET | Freq: Two times a day (BID) | ORAL | Status: DC
  Administered 2024-02-17 – 2024-02-21 (×6): 1 via ORAL
  Filled 2024-02-17 (×11): qty 1

## 2024-02-17 MED ORDER — SUGAMMADEX SODIUM 200 MG/2ML IV SOLN
INTRAVENOUS | Status: AC
  Filled 2024-02-17: qty 2

## 2024-02-17 MED ORDER — CELECOXIB 100 MG PO CAPS: 100.0000 mg | ORAL_CAPSULE | Freq: Two times a day (BID) | ORAL | 0 refills | Status: AC

## 2024-02-17 MED ORDER — POLYETHYLENE GLYCOL 3350 17 G PO PACK: 17.0000 g | PACK | Freq: Every day | ORAL | 0 refills | Status: AC

## 2024-02-17 MED ORDER — POTASSIUM CHLORIDE CRYS ER 20 MEQ PO TBCR
20.0000 meq | EXTENDED_RELEASE_TABLET | Freq: Every day | ORAL | Status: DC
  Administered 2024-02-18 – 2024-02-22 (×4): 20 meq via ORAL
  Filled 2024-02-17 (×6): qty 1

## 2024-02-17 MED ORDER — ZOLPIDEM TARTRATE 5 MG PO TABS: 5.0000 mg | ORAL_TABLET | Freq: Every evening | ORAL | Status: DC | PRN

## 2024-02-17 MED ORDER — LIDOCAINE HCL (CARDIAC) PF 100 MG/5ML IV SOSY
PREFILLED_SYRINGE | INTRAVENOUS | Status: DC | PRN
  Administered 2024-02-17: 70 mg via INTRAVENOUS

## 2024-02-17 MED ORDER — DEXAMETHASONE SOD PHOSPHATE PF 10 MG/ML IJ SOLN
INTRAMUSCULAR | Status: DC | PRN
  Administered 2024-02-17: 4 mg via INTRAVENOUS

## 2024-02-17 MED ORDER — HYDROCODONE-ACETAMINOPHEN 10-325 MG PO TABS
1.0000 | ORAL_TABLET | Freq: Four times a day (QID) | ORAL | Status: DC | PRN
  Administered 2024-02-18 – 2024-02-21 (×6): 1 via ORAL
  Filled 2024-02-17 (×7): qty 1
  Filled 2024-02-17: qty 2
  Filled 2024-02-17: qty 1

## 2024-02-17 MED ORDER — ONDANSETRON HCL 4 MG/2ML IJ SOLN
INTRAMUSCULAR | Status: AC
  Filled 2024-02-17: qty 2

## 2024-02-17 MED ORDER — LIDOCAINE HCL (PF) 2 % IJ SOLN
INTRAMUSCULAR | Status: AC
  Filled 2024-02-17: qty 5

## 2024-02-17 MED ORDER — BUPIVACAINE-EPINEPHRINE (PF) 0.25% -1:200000 IJ SOLN
INTRAMUSCULAR | Status: AC
  Filled 2024-02-17: qty 30

## 2024-02-17 MED ORDER — EZETIMIBE 10 MG PO TABS
10.0000 mg | ORAL_TABLET | Freq: Every day | ORAL | Status: DC
  Administered 2024-02-18 – 2024-02-22 (×4): 10 mg via ORAL
  Filled 2024-02-17 (×6): qty 1

## 2024-02-17 MED ORDER — ROCURONIUM BROMIDE 100 MG/10ML IV SOLN
INTRAVENOUS | Status: DC | PRN
  Administered 2024-02-17: 50 mg via INTRAVENOUS

## 2024-02-17 MED ORDER — ONDANSETRON HCL 4 MG PO TABS
4.0000 mg | ORAL_TABLET | Freq: Four times a day (QID) | ORAL | Status: DC | PRN
  Administered 2024-02-20: 4 mg via ORAL
  Filled 2024-02-17: qty 1

## 2024-02-17 MED ORDER — SUGAMMADEX SODIUM 200 MG/2ML IV SOLN
INTRAVENOUS | Status: DC | PRN
  Administered 2024-02-17: 150 mg via INTRAVENOUS

## 2024-02-17 MED ORDER — INSULIN ASPART 100 UNIT/ML IJ SOLN
0.0000 [IU] | Freq: Three times a day (TID) | INTRAMUSCULAR | Status: DC
  Administered 2024-02-17: 2 [IU] via SUBCUTANEOUS
  Administered 2024-02-18: 3 [IU] via SUBCUTANEOUS
  Administered 2024-02-18 – 2024-02-22 (×4): 2 [IU] via SUBCUTANEOUS
  Filled 2024-02-17: qty 3
  Filled 2024-02-17 (×7): qty 2

## 2024-02-17 MED ORDER — HYDROMORPHONE HCL 1 MG/ML IJ SOLN
0.5000 mg | INTRAMUSCULAR | Status: DC | PRN
  Administered 2024-02-19: 0.5 mg via INTRAVENOUS
  Filled 2024-02-17: qty 1

## 2024-02-17 MED ORDER — ACETAMINOPHEN 500 MG PO TABS
1000.0000 mg | ORAL_TABLET | Freq: Once | ORAL | Status: AC
  Administered 2024-02-17: 1000 mg via ORAL
  Filled 2024-02-17: qty 2

## 2024-02-17 MED ORDER — ORAL CARE MOUTH RINSE: 15.0000 mL | Freq: Once | OROMUCOSAL | Status: AC

## 2024-02-17 MED ORDER — ACETAMINOPHEN 500 MG PO TABS: 1000.0000 mg | ORAL_TABLET | Freq: Three times a day (TID) | ORAL | Status: AC | PRN

## 2024-02-17 MED ORDER — ACETAMINOPHEN 500 MG PO TABS
1000.0000 mg | ORAL_TABLET | Freq: Four times a day (QID) | ORAL | Status: AC
  Administered 2024-02-17 – 2024-02-18 (×3): 1000 mg via ORAL
  Filled 2024-02-17 (×3): qty 2

## 2024-02-17 MED ORDER — 0.9 % SODIUM CHLORIDE (POUR BTL) OPTIME
TOPICAL | Status: DC | PRN
Start: 2024-02-17 — End: 2024-02-17
  Administered 2024-02-17: 1000 mL

## 2024-02-17 MED ORDER — SODIUM CHLORIDE (PF) 0.9 % IJ SOLN
INTRAMUSCULAR | Status: DC | PRN
  Administered 2024-02-17: 80 mL

## 2024-02-17 MED ORDER — SENNA 8.6 MG PO TABS
1.0000 | ORAL_TABLET | Freq: Two times a day (BID) | ORAL | Status: DC
  Filled 2024-02-17 (×6): qty 1

## 2024-02-17 MED ORDER — DIPHENHYDRAMINE HCL 12.5 MG/5ML PO ELIX: 12.5000 mg | ORAL_SOLUTION | ORAL | Status: DC | PRN

## 2024-02-17 MED ORDER — FENTANYL CITRATE (PF) 100 MCG/2ML IJ SOLN
INTRAMUSCULAR | Status: DC | PRN
  Administered 2024-02-17: 100 ug via INTRAVENOUS
  Administered 2024-02-17 (×2): 50 ug via INTRAVENOUS

## 2024-02-17 SURGICAL SUPPLY — 62 items
BAG COUNTER SPONGE SURGICOUNT (BAG) IMPLANT
BAG ZIPLOCK 12X15 (MISCELLANEOUS) ×1 IMPLANT
BIT DRILL TRIDENT 4X40 SU (BIT) IMPLANT
BLADE SAW SAG 25X90X1.19 (BLADE) ×1 IMPLANT
BRUSH FEMORAL CANAL (MISCELLANEOUS) IMPLANT
CHLORAPREP W/TINT 26 (MISCELLANEOUS) ×2 IMPLANT
CNTNR URN SCR LID CUP LEK RST (MISCELLANEOUS) ×1 IMPLANT
COVER SURGICAL LIGHT HANDLE (MISCELLANEOUS) ×1 IMPLANT
DERMABOND ADVANCED .7 DNX12 (GAUZE/BANDAGES/DRESSINGS) ×2 IMPLANT
DRAPE HIP W/POCKET STRL (MISCELLANEOUS) ×1 IMPLANT
DRAPE INCISE IOBAN 85X60 (DRAPES) ×1 IMPLANT
DRAPE POUCH INSTRU U-SHP 10X18 (DRAPES) ×1 IMPLANT
DRAPE SHEET LG 3/4 BI-LAMINATE (DRAPES) ×3 IMPLANT
DRAPE U-SHAPE 47X51 STRL (DRAPES) ×2 IMPLANT
DRSG AQUACEL AG ADV 3.5X10 (GAUZE/BANDAGES/DRESSINGS) ×1 IMPLANT
ELECT BLADE TIP CTD 4 INCH (ELECTRODE) ×1 IMPLANT
ELECT REM PT RETURN 15FT ADLT (MISCELLANEOUS) ×1 IMPLANT
FACESHIELD WRAPAROUND OR TEAM (MASK) ×1 IMPLANT
GAUZE SPONGE 4X4 12PLY STRL (GAUZE/BANDAGES/DRESSINGS) ×1 IMPLANT
GLOVE BIO SURGEON STRL SZ 6.5 (GLOVE) ×2 IMPLANT
GLOVE BIOGEL PI IND STRL 6.5 (GLOVE) ×1 IMPLANT
GLOVE BIOGEL PI IND STRL 8 (GLOVE) ×1 IMPLANT
GLOVE SURG ORTHO 8.0 STRL STRW (GLOVE) ×2 IMPLANT
GOWN STRL REUS W/ TWL XL LVL3 (GOWN DISPOSABLE) ×2 IMPLANT
HEAD BIOLOX HIP 36/-2.5 (Joint) IMPLANT
HOLDER FOLEY CATH W/STRAP (MISCELLANEOUS) ×1 IMPLANT
HOOD PEEL AWAY T7 (MISCELLANEOUS) ×3 IMPLANT
INSERT 0 DEG POLY 36 F (Miscellaneous) IMPLANT
KIT BASIN OR (CUSTOM PROCEDURE TRAY) ×1 IMPLANT
KIT TURNOVER KIT A (KITS) ×1 IMPLANT
MANIFOLD NEPTUNE II (INSTRUMENTS) ×1 IMPLANT
MARKER SKIN DUAL TIP RULER LAB (MISCELLANEOUS) ×1 IMPLANT
NDL SAFETY ECLIPSE 18X1.5 (NEEDLE) ×2 IMPLANT
PACK TOTAL JOINT (CUSTOM PROCEDURE TRAY) ×1 IMPLANT
PAD ARMBOARD POSITIONER FOAM (MISCELLANEOUS) ×1 IMPLANT
PENCIL SMOKE EVACUATOR (MISCELLANEOUS) ×1 IMPLANT
PRESSURIZER FEMORAL UNIV (MISCELLANEOUS) IMPLANT
PROTECTOR NERVE ULNAR (MISCELLANEOUS) IMPLANT
RETRIEVER SUT HEWSON (MISCELLANEOUS) ×1 IMPLANT
SCREW HEX LP 6.5X30 (Screw) IMPLANT
SEALER BIPOLAR AQUA 6.0 (INSTRUMENTS) IMPLANT
SET HNDPC FAN SPRY TIP SCT (DISPOSABLE) ×1 IMPLANT
SHELL TRIDENT II CLUST SZ 56MM (Shell) IMPLANT
SOLUTION IRRIG SURGIPHOR (IV SOLUTION) IMPLANT
SPIKE FLUID TRANSFER (MISCELLANEOUS) ×1 IMPLANT
STEM HIGH OFFSET SZ5X105 HIGH (Stem) IMPLANT
SUCTION TUBE FRAZIER 12FR DISP (SUCTIONS) ×1 IMPLANT
SUT BONE WAX W31G (SUTURE) ×1 IMPLANT
SUT ETHIBOND #5 BRAIDED 30INL (SUTURE) ×1 IMPLANT
SUT STRATAFIX 14 PDO 48 VLT (SUTURE) ×1 IMPLANT
SUT VIC AB 2-0 CT2 27 (SUTURE) ×2 IMPLANT
SUTURE STRATFX 0 PDS 27 VIOLET (SUTURE) ×1 IMPLANT
SUTURE STRATFX MNCRL+ 3-0 PS-2 (SUTURE) ×1 IMPLANT
SYR 30ML LL (SYRINGE) ×1 IMPLANT
SYR 50ML LL SCALE MARK (SYRINGE) ×1 IMPLANT
TOWEL GREEN STERILE FF (TOWEL DISPOSABLE) ×1 IMPLANT
TOWEL OR 17X26 10 PK STRL BLUE (TOWEL DISPOSABLE) ×1 IMPLANT
TOWER CARTRIDGE SMART MIX (DISPOSABLE) IMPLANT
TRAY FOLEY MTR SLVR 16FR STAT (SET/KITS/TRAYS/PACK) IMPLANT
TUBE SUCTION HIGH CAP CLEAR NV (SUCTIONS) ×1 IMPLANT
UNDERPAD 30X36 HEAVY ABSORB (UNDERPADS AND DIAPERS) ×1 IMPLANT
WATER STERILE IRR 1000ML POUR (IV SOLUTION) ×2 IMPLANT

## 2024-02-17 NOTE — Interval H&P Note (Signed)
The patient has been re-examined, and the chart reviewed, and there have been no interval changes to the documented history and physical.    Plan for L THA for left hip OA  The operative side was examined and the patient was confirmed to have sensation to DPN, SPN, TN intact, Motor EHL, ext, flex 5/5, and DP 2+, PT 2+, No significant edema.   The risks, benefits, and alternatives have been discussed at length with patient, and the patient is willing to proceed.  Left hip marked. Consent has been signed.  

## 2024-02-17 NOTE — Progress Notes (Signed)
 PT Cancellation Note  Patient Details Name: [name] MRN: [id number] DOB: [birth year]   Cancelled Treatment:    Reason Eval/Treat Not Completed: Other (comment). PT arrived at 1732 pt eating dinner. Family and nurse present. Pt expressed concern per anticipated pain with mobility tasks and reported 10/10 pain at rest. Pt requested to finish meal and for PT to return. PT to continue to follow acutely.   [place], PT Acute Rehab   [place] VEAR Drone 02/17/2024, 6:07 PM

## 2024-02-17 NOTE — Evaluation (Signed)
 Physical Therapy Evaluation Patient Details Name: [name] MRN: [id number] DOB: 29-May-1950 Today's Date: 02/17/2024  History of Present Illness  73 yo female presents to therapy s/p L THA, posterior approach on 02/17/2024 due to failure of conservative measures. Pt is currently LLE WBAT and no formal hip precautions. Pt PMH Includes but is not limited to: thyroid  nodules, PVC, humerus fx, hyperthyroidism, CAD s/p CABG x 3, angina, NSTEMI, myopathy, de Quervians dz, sCHF, lumbar and cervical stenosis, depression, RLS, DM II, HLD, GERD, HTN, tinnitus, PTSD and back surgery.  Clinical Impression      [name] is a 73 y.o. female POD 0 s/p L THA. Patient reports Mod I with mobility at baseline. Patient is now limited by functional impairments (see PT problem list below) and requires CGA/min for bed mobility and CGA for transfers. Patient was able to ambulate 18 feet with RW and CGA level of assist. Patient instructed in exercise to facilitate ROM and circulation to manage edema. Patient will benefit from continued skilled PT interventions to address impairments and progress towards PLOF. Acute PT will follow to progress mobility and stair training in preparation for safe discharge home with family and caregiver support with OPPT services.     If plan is discharge home, recommend the following: A little help with walking and/or transfers;A little help with bathing/dressing/bathroom;Assistance with cooking/housework;Assist for transportation   Can travel by private vehicle        Equipment Recommendations Rolling walker (2 wheels)  Recommendations for Other Services       Functional Status Assessment Patient has had a recent decline in their functional status and demonstrates the ability to make significant improvements in function in a reasonable and predictable amount of time.     Precautions / Restrictions Precautions Precautions: Fall Restrictions Weight Bearing  Restrictions Per Provider Order: No      Mobility  Bed Mobility Overal bed mobility: Needs Assistance Bed Mobility: Supine to Sit     Supine to sit: Min assist     General bed mobility comments: increased time, cues and use of hospital bed pt ed provided on use of gait belt for leg lifter    Transfers Overall transfer level: Needs assistance Equipment used: Rolling walker (2 wheels) Transfers: Sit to/from Stand Sit to Stand: Contact guard assist           General transfer comment: min cues    Ambulation/Gait Ambulation/Gait assistance: Contact guard assist Gait Distance (Feet): 18 Feet Assistive device: Rolling walker (2 wheels) Gait Pattern/deviations: Step-to pattern, Decreased stance time - left, Antalgic, Trunk flexed Gait velocity: decreased     General Gait Details: slight trunk flexion with RW lowered for improved B UE WB to offload L LE in stance phase, cues for encouragement and more normalized gait pattern  Stairs            Wheelchair Mobility     Tilt Bed    Modified Rankin (Stroke Patients Only)       Balance Overall balance assessment: Needs assistance Sitting-balance support: Feet supported Sitting balance-Leahy Scale: Good     Standing balance support: Bilateral upper extremity supported, During functional activity, Reliant on assistive device for balance Standing balance-Leahy Scale: Poor                               Pertinent Vitals/Pain Pain Assessment Pain Assessment: 0-10 Pain Score: 10-Worst pain ever (pt reports pain decreased with  mobility 5/10 at end of session with nurse providing pt with pain medicaiton) Pain Location: L hip and LE Pain Descriptors / Indicators: Aching, Constant, Discomfort, Dull, Grimacing, Operative site guarding Pain Intervention(s): Limited activity within patient's tolerance, Monitored during session, Premedicated before session, Repositioned, Ice applied    Home Living  Family/patient expects to be discharged to:: Private residence Living Arrangements: Alone Available Help at Discharge: Family;Friend(s) Type of Home: Independent living facility Home Access: Level entry       Home Layout: One level Home Equipment: Grab bars - tub/shower;Grab bars - toilet;Cane - single point;BSC/3in1      Prior Function Prior Level of Function : Independent/Modified Independent;Driving             Mobility Comments: mod I with use of SPC for all mobiltiy tasks, ADLs and self care       Extremity/Trunk Assessment        Lower Extremity Assessment Lower Extremity Assessment: LLE deficits/detail LLE Deficits / Details: ankle DF/PF 5/5 LLE Sensation: history of peripheral neuropathy (toes)    Cervical / Trunk Assessment Cervical / Trunk Assessment: Back Surgery  Communication   Communication Communication: No apparent difficulties    Cognition Arousal: Alert Behavior During Therapy: WFL for tasks assessed/performed   PT - Cognitive impairments: No apparent impairments                         Following commands: Intact       Cueing       General Comments      Exercises Total Joint Exercises Ankle Circles/Pumps: AROM, Both, 15 reps   Assessment/Plan    PT Assessment Patient needs continued PT services  PT Problem List Decreased strength;Decreased range of motion;Decreased activity tolerance;Decreased balance;Decreased mobility;Decreased coordination;Pain       PT Treatment Interventions DME instruction;Gait training;Functional mobility training;Therapeutic activities;Therapeutic exercise;Balance training;Neuromuscular re-education;Patient/family education;Modalities    PT Goals (Current goals can be found in the Care Plan section)  Acute Rehab PT Goals Patient Stated Goal: to not have pain PT Goal Formulation: With patient Time For Goal Achievement: 03/02/24 Potential to Achieve Goals: Good    Frequency 7X/week      Co-evaluation               AM-PAC PT 6 Clicks Mobility  Outcome Measure Help needed turning from your back to your side while in a flat bed without using bedrails?: None Help needed moving from lying on your back to sitting on the side of a flat bed without using bedrails?: A Little Help needed moving to and from a bed to a chair (including a wheelchair)?: A Little Help needed standing up from a chair using your arms (e.g., wheelchair or bedside chair)?: A Little Help needed to walk in hospital room?: A Little Help needed climbing 3-5 steps with a railing? : Total 6 Click Score: 17    End of Session Equipment Utilized During Treatment: Gait belt Activity Tolerance: Patient tolerated treatment well;No increased pain Patient left: in chair;with call bell/phone within reach Nurse Communication: Mobility status;Patient requests pain meds PT Visit Diagnosis: Unsteadiness on feet (R26.81);Other abnormalities of gait and mobility (R26.89);Muscle weakness (generalized) (M62.81);Difficulty in walking, not elsewhere classified (R26.2);Pain Pain - Right/Left: Left Pain - part of body: Hip;Leg    Time: 8197-8153 PT Time Calculation (min) (ACUTE ONLY): 44 min   Charges:   PT Evaluation $PT Eval Low Complexity: 1 Low PT Treatments $Gait Training: 8-22 mins $Therapeutic Activity: 8-22  mins PT General Charges $$ ACUTE PT VISIT: 1 Visit         [place], PT Acute Rehab   [place] VEAR Drone 02/17/2024, 6:57 PM

## 2024-02-17 NOTE — Telephone Encounter (Signed)
 Pharmacy Patient Advocate Encounter  Received notification from [facility] that Prior Authorization for repatha  has been APPROVED from 02/17/24 to 02/15/25   PA #/Case ID/Reference #: [id number]

## 2024-02-17 NOTE — Anesthesia Procedure Notes (Signed)
 Procedure Name: Intubation Date/Time: 02/17/2024 12:14 PM  Performed by: Gladis Honey, CRNAPre-anesthesia Checklist: Patient identified, Emergency Drugs available, Suction available and Patient being monitored Patient Re-evaluated:Patient Re-evaluated prior to induction Oxygen  Delivery Method: Circle System Utilized Preoxygenation: Pre-oxygenation with 100% oxygen  Induction Type: IV induction Ventilation: Mask ventilation without difficulty Laryngoscope Size: Miller and 2 Grade View: Grade II Tube type: Oral Tube size: 7.0 mm Number of attempts: 1 Airway Equipment and Method: Stylet and Oral airway Placement Confirmation: ETT inserted through vocal cords under direct vision, positive ETCO2 and breath sounds checked- equal and bilateral Secured at: 21 cm Tube secured with: Tape Dental Injury: Teeth and Oropharynx as per pre-operative assessment  Comments: Grade 2b view

## 2024-02-17 NOTE — Discharge Instructions (Addendum)
 INSTRUCTIONS AFTER JOINT REPLACEMENT   Remove items at home which could result in a fall. This includes throw rugs or furniture in walking pathways ICE to the affected joint every three hours while awake for 30 minutes at a time, for at least the first 3-5 days, and then as needed for pain and swelling.  Continue to use ice for pain and swelling. You may notice swelling that will progress down to the foot and ankle.  This is normal after surgery.  Elevate your leg when you are not up walking on it.   Continue to use the breathing machine you got in the hospital (incentive spirometer) which will help keep your temperature down.  It is common for your temperature to cycle up and down following surgery, especially at night when you are not up moving around and exerting yourself.  The breathing machine keeps your lungs expanded and your temperature down.  DIET:  As you were doing prior to hospitalization, we recommend a well-balanced diet.  DRESSING / WOUND CARE / SHOWERING:  Keep the surgical dressing until follow up.  The dressing is water  proof, so you can shower without any extra covering.  IF THE DRESSING FALLS OFF or the wound gets wet inside, change the dressing with sterile gauze.  Please use good hand washing techniques before changing the dressing.  Do not use any lotions or creams on the incision until instructed by your surgeon.    ACTIVITY  Increase activity slowly as tolerated, but follow the weight bearing instructions below.   No driving for 6 weeks or until further direction given by your physician.  You cannot drive while taking narcotics.  No lifting or carrying greater than 10 lbs. until further directed by your surgeon. Avoid periods of inactivity such as sitting longer than an hour when not asleep. This helps prevent blood clots.  You may return to work once you are authorized by your doctor.   WEIGHT BEARING: Weight bearing as tolerated with assist device (walker, cane, etc) as  directed, use it as long as suggested by your surgeon or therapist, typically at least 4-6 weeks.  EXERCISES  Results after joint replacement surgery are often greatly improved when you follow the exercise, range of motion and muscle strengthening exercises prescribed by your doctor. Safety measures are also important to protect the joint from further injury. Any time any of these exercises cause you to have increased pain or swelling, decrease what you are doing until you are comfortable again and then slowly increase them. If you have problems or questions, call your caregiver or physical therapist for advice.   Rehabilitation is important following a joint replacement. After just a few days of immobilization, the muscles of the leg can become weakened and shrink (atrophy).  These exercises are designed to build up the tone and strength of the thigh and leg muscles and to improve motion. Often times heat used for twenty to thirty minutes before working out will loosen up your tissues and help with improving the range of motion but do not use heat for the first two weeks following surgery (sometimes heat can increase post-operative swelling).   These exercises can be done on a training (exercise) mat, on the floor, on a table or on a bed. Use whatever works the best and is most comfortable for you.    Use music or television while you are exercising so that the exercises are a pleasant break in your day. This will make your life  better with the exercises acting as a break in your routine that you can look forward to.   Perform all exercises about fifteen times, three times per day or as directed.  You should exercise both the operative leg and the other leg as well.  Exercises include:   Quad Sets - Tighten up the muscle on the front of the thigh (Quad) and hold for 5-10 seconds.   Straight Leg Raises - With your knee straight (if you were given a brace, keep it on), lift the leg to 60 degrees, hold  for 3 seconds, and slowly lower the leg.  Perform this exercise against resistance later as your leg gets stronger.  Leg Slides: Lying on your back, slowly slide your foot toward your buttocks, bending your knee up off the floor (only go as far as is comfortable). Then slowly slide your foot back down until your leg is flat on the floor again.  Angel Wings: Lying on your back spread your legs to the side as far apart as you can without causing discomfort.  Hamstring Strength:  Lying on your back, push your heel against the floor with your leg straight by tightening up the muscles of your buttocks.  Repeat, but this time bend your knee to a comfortable angle, and push your heel against the floor.  You may put a pillow under the heel to make it more comfortable if necessary.   A rehabilitation program following joint replacement surgery can speed recovery and prevent re-injury in the future due to weakened muscles. Contact your doctor or a physical therapist for more information on knee rehabilitation.   CONSTIPATION:  Constipation is defined medically as fewer than three stools per week and severe constipation as less than one stool per week.  Even if you have a regular bowel pattern at home, your normal regimen is likely to be disrupted due to multiple reasons following surgery.  Combination of anesthesia, postoperative narcotics, change in appetite and fluid intake all can affect your bowels.   YOU MUST use at least one of the following options; they are listed in order of increasing strength to get the job done.  They are all available over the counter, and you may need to use some, POSSIBLY even all of these options:    Drink plenty of fluids (prune juice may be helpful) and high fiber foods Colace 100 mg by mouth twice a day  Senokot for constipation as directed and as needed Dulcolax (bisacodyl ), take with full glass of water   Miralax (polyethylene glycol) once or twice a day as needed.  If you  have tried all these things and are unable to have a bowel movement in the first 3-4 days after surgery call either your surgeon or your primary doctor.    If you experience loose stools or diarrhea, hold the medications until you stool forms back up.  If your symptoms do not get better within 1 week or if they get worse, check with your doctor.  If you experience the worst abdominal pain ever or develop nausea or vomiting, please contact the office immediately for further recommendations for treatment.  ITCHING:  If you experience itching with your medications, try taking only a single pain pill, or even half a pain pill at a time.  You can also use Benadryl  over the counter for itching or also to help with sleep.   TED HOSE STOCKINGS:  Use stockings on both legs until for at least 2 weeks or  as directed by physician office. They may be removed at night for sleeping.  MEDICATIONS:  See your medication summary on the "After Visit Summary" that nursing will review with you.  You may have some home medications which will be placed on hold until you complete the course of blood thinner medication.  It is important for you to complete the blood thinner medication as prescribed.  Blood clot prevention (DVT Prophylaxis): After surgery you are at an increased risk for a blood clot.  You were prescribed a blood thinner, Eliquis, to be taken twice daily for a total of 4 weeks from surgery to help reduce your risk of getting a blood clot.  Signs of a pulmonary embolus (blood clot in the lungs) include sudden short of breath, feeling lightheaded or dizzy, chest pain with a deep breath, rapid pulse rapid breathing.  Signs of a blood clot in your arms or legs include new unexplained swelling and cramping, warm, red or darkened skin around the painful area.  Please call the office or 911 right away if these signs or symptoms develop.  PRECAUTIONS:   If you experience chest pain or shortness of breath - call 911  immediately for transfer to the hospital emergency department.   If you develop a fever greater that 101 F, purulent drainage from wound, increased redness or drainage from wound, foul odor from the wound/dressing, or calf pain - CONTACT YOUR SURGEON.                                                   FOLLOW-UP APPOINTMENTS:  If you do not already have a post-op appointment, please call the office for an appointment to be seen by your surgeon.  Guidelines for how soon to be seen are listed in your "After Visit Summary", but are typically between 2-3 weeks after surgery.  If you have a specialized bandage, you may be told to follow up 1 week after surgery.  POST-OPERATIVE OPIOID TAPER INSTRUCTIONS: It is important to wean off of your opioid medication as soon as possible. If you do not need pain medication after your surgery it is ok to stop day one. Opioids include: Codeine, Hydrocodone (Norco, Vicodin), Oxycodone (Percocet, oxycontin ) and hydromorphone amongst others.  Long term and even short term use of opiods can cause: Increased pain response Dependence Constipation Depression Respiratory depression And more.  Withdrawal symptoms can include Flu like symptoms Nausea, vomiting And more Techniques to manage these symptoms Hydrate well Eat regular healthy meals Stay active Use relaxation techniques(deep breathing, meditating, yoga) Do Not substitute Alcohol to help with tapering If you have been on opioids for less than two weeks and do not have pain than it is ok to stop all together.  Plan to wean off of opioids This plan should start within one week post op of your joint replacement. Maintain the same interval or time between taking each dose and first decrease the dose.  Cut the total daily intake of opioids by one tablet each day Next start to increase the time between doses. The last dose that should be eliminated is the evening dose.   MAKE SURE YOU:  Understand these  instructions.  Get help right away if you are not doing well or get worse.    Thank you for letting us  be a part of your medical care team.  It is a privilege we respect greatly.  We hope these instructions will help you stay on track for a fast and full recovery!     Information on my medicine - ELIQUIS (apixaban)   Why was Eliquis prescribed for you? Eliquis was prescribed for you to reduce the risk of blood clots forming after orthopedic surgery.    What do You need to know about Eliquis? Take your Eliquis TWICE DAILY - one tablet in the morning and one tablet in the evening with or without food.  It would be best to take the dose about the same time each day.  If you have difficulty swallowing the tablet whole please discuss with your pharmacist how to take the medication safely.  Take Eliquis exactly as prescribed by your doctor and DO NOT stop taking Eliquis without talking to the doctor who prescribed the medication.  Stopping without other medication to take the place of Eliquis may increase your risk of developing a clot.  After discharge, you should have regular check-up appointments with your healthcare provider that is prescribing your Eliquis.  What do you do if you miss a dose? If a dose of ELIQUIS is not taken at the scheduled time, take it as soon as possible on the same day and twice-daily administration should be resumed.  The dose should not be doubled to make up for a missed dose.  Do not take more than one tablet of ELIQUIS at the same time.  Important Safety Information A possible side effect of Eliquis is bleeding. You should call your healthcare provider right away if you experience any of the following: Bleeding from an injury or your nose that does not stop. Unusual colored urine (red or dark brown) or unusual colored stools (red or black). Unusual bruising for unknown reasons. A serious fall or if you hit your head (even if there is no  bleeding).  Some medicines may interact with Eliquis and might increase your risk of bleeding or clotting while on Eliquis. To help avoid this, consult your healthcare provider or pharmacist prior to using any new prescription or non-prescription medications, including herbals, vitamins, non-steroidal anti-inflammatory drugs (NSAIDs) and supplements.  This website has more information on Eliquis (apixaban): http://www.eliquis.com/eliquis/home

## 2024-02-17 NOTE — Transfer of Care (Signed)
 Immediate Anesthesia Transfer of Care Note  Patient: [name]  Procedure(s) Performed: ARTHROPLASTY, HIP, TOTAL,POSTERIOR APPROACH (Left: Hip)  Patient Location: PACU  Anesthesia Type:General  Level of Consciousness: awake, alert , and oriented  Airway & Oxygen  Therapy: Patient Spontanous Breathing  Post-op Assessment: Report given to RN and Post -op Vital signs reviewed and stable  Post vital signs: Reviewed and stable  Last Vitals:  Vitals Value Taken Time  BP 154/67 02/17/24 13:45  Temp 97   Pulse 56 02/17/24 13:57  Resp 13 02/17/24 13:57  SpO2 100 % 02/17/24 13:57  Vitals shown include unfiled device data.  Last Pain:  Vitals:   02/17/24 1343  TempSrc:   PainSc: 10-Worst pain ever         Complications: No notable events documented.

## 2024-02-17 NOTE — Plan of Care (Signed)

## 2024-02-17 NOTE — Anesthesia Postprocedure Evaluation (Signed)
 Anesthesia Post Note  Patient: [name]  Procedure(s) Performed: ARTHROPLASTY, HIP, TOTAL,POSTERIOR APPROACH (Left: Hip)     Patient location during evaluation: PACU Anesthesia Type: General Level of consciousness: sedated and patient cooperative Pain management: pain level controlled Vital Signs Assessment: post-procedure vital signs reviewed and stable Respiratory status: spontaneous breathing Cardiovascular status: stable Anesthetic complications: no   No notable events documented.  Last Vitals:  Vitals:   02/17/24 1758 02/17/24 2140  BP: (!) 150/72 (!) 142/62  Pulse: 73 65  Resp: 19 18  Temp: 36.6 C 36.7 C  SpO2: 100% 100%    Last Pain:  Vitals:   02/17/24 1947  TempSrc:   PainSc: 3                  Norleen Pope

## 2024-02-17 NOTE — Op Note (Signed)
 02/17/2024  1:00 PM  PATIENT:  [name]   MRN: [id number]  PRE-OPERATIVE DIAGNOSIS: End-stage left hip osteoarthritis  POST-OPERATIVE DIAGNOSIS:  same  PROCEDURE: Press-fit left total hip arthroplasty  PREOPERATIVE INDICATIONS:    [name] is an 73 y.o. female who has a diagnosis of End-stage left hip osteoarthritis and elected for surgical management after failing conservative treatment.  The risks benefits and alternatives were discussed with the patient including but not limited to the risks of nonoperative treatment, versus surgical intervention including infection, bleeding, nerve injury, periprosthetic fracture, the need for revision surgery, dislocation, leg length discrepancy, blood clots, cardiopulmonary complications, morbidity, mortality, among others, and they were willing to proceed.     OPERATIVE REPORT     SURGEON:  Toribio Higashi, MD    ASSISTANT: Jon Hurst, RNFA, (Present throughout the entire procedure,  necessary for completion of procedure in a timely manner, assisting with retraction, instrumentation, and closure)     ANESTHESIA: General  ESTIMATED BLOOD LOSS: 300cc    COMPLICATIONS:  None.     COMPONENTS:   Stryker Trident 256 mm acetabular shell, 6.5 hex screws x 2, insignia size 5 high offset femoral stem, 36-2.5 mm ceramic head Implant Name Type Inv. Item Serial No. Manufacturer Lot No. LRB No. Used Action  SCREW HEX LP 6.5X30 - [id number] Screw SCREW HEX LP 6.5X30  [employer] [id number] Left 1 Implanted  SCREW HEX LP 6.5X30 - [id number] Screw SCREW HEX LP 6.5X30  [employer] MYFA Left 1 Implanted  INSERT 0 DEG POLY 36 F - [id number] Miscellaneous INSERT 0 DEG POLY 36 F  [employer] [id number] Left 1 Implanted  SHELL TRIDENT II CLUST SZ - [id number] Shell SHELL TRIDENT II CLUST SZ  [employer] [id number] Left 1 Implanted  STEM HIGH OFFSET SZ5X105 HIGH - [id number] Stem STEM HIGH OFFSET SZ5X105 HIGH   [employer] [id number] Left 1 Implanted  HEAD BIOLOX HIP 36/-2.5 - [id number] Joint HEAD BIOLOX HIP 36/-2.5  [employer] [id number] Left 1 Implanted      PROCEDURE IN DETAIL:   The patient was met in the holding area and  identified.  The appropriate hip was identified and marked at the operative site.  The patient was then transported to the OR  and  placed under anesthesia.  At that point, the patient was  placed in the lateral decubitus position with the operative side up and  secured to the operating room table  and all bony prominences padded. A subaxillary role was also placed.    The operative lower extremity was prepped from the iliac crest to the distal leg.  Sterile draping was performed.  Preoperative antibiotics, 2 gm of ancef ,1 gm of Tranexamic Acid , and 8 mg of Decadron  administered. Time out was performed prior to incision.      A routine posterolateral approach was utilized via sharp dissection  carried down to the subcutaneous tissue.  Gross bleeders were Bovie coagulated.  The iliotibial band was identified and incised along the length of the skin incision through the glute max fascia.  Charnley retractor was placed with care to protect the sciatic nerve posteriorly.  With the hip internally rotated, the piriformis tendon was identified and released from the femoral insertion and tagged with a #5 Ethibond.  A capsulotomy was then performed off the femoral insertion and also tagged with a #5 Ethibond.    The femoral neck was exposed, and I resected the femoral neck based on preoperative templating relative to  the lesser trochanter.    I then exposed the deep acetabulum, cleared out any tissue including the ligamentum teres.  After adequate visualization, I excised the labrum.  I then started reaming with a 52 mm reamer, first medializing to the floor of the cotyloid fossa, and then in the position of the cup aiming towards the greater sciatic notch, matching the version of  the transverse acetabular ligament and tucked under the anterior wall. I reamed up to 56 mm reamer with good bony bed preparation and a 56 mm cup was chosen.  The real cup was then impacted into place.  Appropriate version and inclination was confirmed clinically matching their bony anatomy, and also with the use of the jig.  I placed 2 screws in the posterior superior quadrant to augment fixation.  A neutral liner was placed and impacted. It was confirmed to be appropriately seated and the acetabular retractors were removed.    I then prepared the proximal femur using the box cutter, Charnley awl, and then sequentially broached starting with 0 up to a size 5.  A trial broach, neck, and head was utilized, and I reduced the hip and it was found to have excellent stability.  There was no impingement with full extension and 90 degrees external rotation.  The hip was stable at the position of sleep and with 90 degrees flexion and 90 degrees of internal rotation.  Leg lengths were also clinically assessed in the lateral position and felt to be equal. Intra-Op flatplate was obtained and confirmed appropriate component positions.  Good fill of the femur with the size 5 broach.  And restoration of leg length and offset. No evidence or concern for fracture.  A final femoral prosthesis size 5 was selected. I then impacted the real femoral prosthesis into place.I again trialed and selected a 36 -2.32mm ball. The hip was then reduced and taken through a range of motion. There was no impingement with full extension and 90 degrees external rotation.  The hip was stable at the position of sleep and with 90 degrees flexion and 90 degrees of internal rotation. Leg lengths were  again assessed and felt to be restored.  We then opened, and I impacted the real head ball into place.  The posterior capsule was then closed with #5 Ethibond.  The piriformis was repaired through the base of the abductor tendon using a [place]  suture passer.  I then irrigated the hip copiously with dilute Betadine and with normal saline pulse lavage. Periarticular injection was then performed with Exparel .   We repaired the fascia #1 barbed suture, followed by 0 barbed suture for the subcutaneous fat.  Skin was closed with 2-0 Vicryl and 3-0 Monocryl.  Dermabond and Aquacel dressing were applied. The patient was then awakened and returned to PACU in stable and satisfactory condition.  Leg lengths in the supine position were assessed and felt to be clinically equal. There were no complications.  Post op recs: WB: WBAT LLE, No formal hip precautions Abx: ancef  Imaging: PACU pelvis Xray Dressing: Aquacell, keep intact until follow up DVT prophylaxis: Eliquis 2.5 mg twice daily starting POD1 Follow up: 2 weeks after surgery for a wound check with Dr. Edna at [facility].  Address: [address], [place], [place] [address]  Office Phone: [phone]   Toribio Edna, MD Orthopedic Surgeon

## 2024-02-18 ENCOUNTER — Other Ambulatory Visit (HOSPITAL_COMMUNITY): Payer: Self-pay

## 2024-02-18 ENCOUNTER — Encounter (HOSPITAL_COMMUNITY): Payer: Self-pay | Admitting: Cardiology

## 2024-02-18 ENCOUNTER — Telehealth (HOSPITAL_COMMUNITY): Payer: Self-pay

## 2024-02-18 ENCOUNTER — Encounter (HOSPITAL_COMMUNITY): Payer: Self-pay | Admitting: Orthopedic Surgery

## 2024-02-18 DIAGNOSIS — M25552 Pain in left hip: Secondary | ICD-10-CM | POA: Diagnosis not present

## 2024-02-18 DIAGNOSIS — M1612 Unilateral primary osteoarthritis, left hip: Secondary | ICD-10-CM | POA: Diagnosis not present

## 2024-02-18 DIAGNOSIS — I2510 Atherosclerotic heart disease of native coronary artery without angina pectoris: Secondary | ICD-10-CM | POA: Diagnosis not present

## 2024-02-18 DIAGNOSIS — I110 Hypertensive heart disease with heart failure: Secondary | ICD-10-CM | POA: Diagnosis not present

## 2024-02-18 DIAGNOSIS — I5022 Chronic systolic (congestive) heart failure: Secondary | ICD-10-CM | POA: Diagnosis not present

## 2024-02-18 DIAGNOSIS — Z79899 Other long term (current) drug therapy: Secondary | ICD-10-CM | POA: Diagnosis not present

## 2024-02-18 DIAGNOSIS — E1140 Type 2 diabetes mellitus with diabetic neuropathy, unspecified: Secondary | ICD-10-CM | POA: Diagnosis not present

## 2024-02-18 LAB — BASIC METABOLIC PANEL WITH GFR
Anion gap: 10 (ref 5–15)
BUN: 17 mg/dL (ref 8–23)
CO2: 24 mmol/L (ref 22–32)
Calcium: 8.8 mg/dL — ABNORMAL LOW (ref 8.9–10.3)
Chloride: 105 mmol/L (ref 98–111)
Creatinine, Ser: 1.05 mg/dL — ABNORMAL HIGH (ref 0.44–1.00)
GFR, Estimated: 56 mL/min — ABNORMAL LOW (ref >60)
Glucose, Bld: 140 mg/dL — ABNORMAL HIGH (ref 70–99)
Potassium: 4.3 mmol/L (ref 3.5–5.1)
Sodium: 139 mmol/L (ref 135–145)

## 2024-02-18 LAB — CBC WITH DIFFERENTIAL/PLATELET
Basophils Absolute: 0 K/uL (ref 0.0–0.1)
Basophils Relative: 0 %
Eosinophils Absolute: 0 K/uL (ref 0.0–0.5)
Eosinophils Relative: 0 %
HCT: 31.8 % — ABNORMAL LOW (ref 36.0–46.0)
Hemoglobin: 11 g/dL — ABNORMAL LOW (ref 12.0–15.0)
Lymphocytes Relative: 14 %
Lymphs Abs: 1.4 K/uL (ref 0.7–4.0)
MCH: 31.3 pg (ref 26.0–34.0)
MCHC: 34.6 g/dL (ref 30.0–36.0)
MCV: 90.3 fL (ref 80.0–100.0)
Monocytes Absolute: 2.5 K/uL — ABNORMAL HIGH (ref 0.1–1.0)
Monocytes Relative: 25 %
Neutro Abs: 6.2 K/uL (ref 1.7–7.7)
Neutrophils Relative %: 61 %
Platelets: 140 K/uL — ABNORMAL LOW (ref 150–400)
RBC: 3.52 MIL/uL — ABNORMAL LOW (ref 3.87–5.11)
RDW: 11.9 % (ref 11.5–15.5)
Smear Review: NORMAL
WBC: 10.1 K/uL (ref 4.0–10.5)
nRBC: 0 % (ref 0.0–0.2)

## 2024-02-18 LAB — GLUCOSE, CAPILLARY
Glucose-Capillary: 130 mg/dL — ABNORMAL HIGH (ref 70–99)
Glucose-Capillary: 174 mg/dL — ABNORMAL HIGH (ref 70–99)
Glucose-Capillary: 134 mg/dL — ABNORMAL HIGH (ref 70–99)
Glucose-Capillary: 132 mg/dL — ABNORMAL HIGH (ref 70–99)

## 2024-02-18 NOTE — Care Management Obs Status (Signed)
 MEDICARE OBSERVATION STATUS NOTIFICATION   Patient Details  Name: [name] MRN: [id number] Date of Birth: 1950/06/05   Medicare Observation Status Notification Given:  [name], LCSW 02/18/2024, 10:31 AM

## 2024-02-18 NOTE — Plan of Care (Signed)
   Problem: Education: Goal: Knowledge of General Education information will improve Description Including pain rating scale, medication(s)/side effects and non-pharmacologic comfort measures Outcome: Progressing   Problem: Health Behavior/Discharge Planning: Goal: Ability to manage health-related needs will improve Outcome: Progressing

## 2024-02-18 NOTE — Telephone Encounter (Signed)
 Patient had Hip replacement and her surgeon took her off Plavix  and replaced it with Eliquis 2.5 mg BID is this okay with you? Please advise

## 2024-02-18 NOTE — TOC Transition Note (Signed)
 Transition of Care [facility]) - Discharge Note   Patient Details  Name: [name] MRN: [id number] Date of Birth: [birth year]  [facility] CM/SW Contact:  NORMAN ASPEN, LCSW Phone Number: 02/18/2024, 9:25 AM   Clinical Narrative:     Met with pt to review DME needs and she immediately becomes irate when asked if she has a RW at home.  She states she got one two years ago after heart surgery but doesn't know where it is.  Attempted to explain that insurance would not cover another walker at this time.  Pt yelling that all y'all want to talk about is this walker.  I don't care and get out.  Left room as instructed by pt.  Per chart review, pt is to have OPPT.  IP CM will sign off.  RN aware.  Final next level of care: OP Rehab Barriers to Discharge: No Barriers Identified   Patient Goals and CMS Choice            Discharge Placement                       Discharge Plan and Services Additional resources added to the After Visit Summary for                                       Social Drivers of Health (SDOH) Interventions SDOH Screenings   Food Insecurity: No Food Insecurity (02/17/2024)  Housing: Low Risk  (02/17/2024)  Transportation Needs: No Transportation Needs (02/17/2024)  Utilities: Not At Risk (02/17/2024)  Alcohol Screen: Low Risk  (01/15/2024)  Depression (PHQ2-9): Low Risk  (01/15/2024)  Financial Resource Strain: Low Risk  (01/15/2024)  Physical Activity: Inactive (01/15/2024)  Social Connections: Moderately Integrated (02/17/2024)  Stress: No Stress Concern Present (01/15/2024)  Tobacco Use: Low Risk  (02/17/2024)  Health Literacy: Adequate Health Literacy (01/15/2024)     Readmission Risk Interventions     No data to display

## 2024-02-18 NOTE — Progress Notes (Signed)
 Physical Therapy Treatment Patient Details Name: [name] MRN: [id number] DOB: Nov 12, 1950 Today's Date: 02/18/2024   History of Present Illness 73 yo female presents to therapy s/p L THA, posterior approach on 02/17/2024 due to failure of conservative measures. Pt is currently LLE WBAT and no formal hip precautions. Pt PMH Includes but is not limited to: thyroid  nodules, PVC, humerus fx, hyperthyroidism, CAD s/p CABG x 3, angina, NSTEMI, myopathy, de Quervians dz, sCHF, lumbar and cervical stenosis, depression, RLS, DM II, HLD, GERD, HTN, tinnitus, PTSD and back surgery.    PT Comments  Pt supine in bed, able to come to sitting at bedside with supv with bedrail removed and bed in flat position to simulate home setup. Pt completes transfers from regular height bedside and BSC with CGA, cues for hand positioning and LE activation, little LLE weightbearing, increased effort. Pt able to void on BSC and complete pericare in standing with supv. Pt amb 160 ft with RW, decreased cadence, slow step to gait pattern, more heel-toe gait pattern but still overall decreased, no LE buckling noted, decreased LLE stance time, increased time and cue for maintaining body within RW frame and avoiding pivoting on surgical leg. Pt remains up in recliner at EOS with chair alarm on, able to verbalize call bell use to call for nursing to assist as needed. Pt reports son and multiple friends lined up to assist while she is recovering. Plan to progress mobility with goal to return home tomorrow with son to assist as needed.   If plan is discharge home, recommend the following: A little help with walking and/or transfers;A little help with bathing/dressing/bathroom;Assistance with cooking/housework;Assist for transportation   Can travel by private vehicle        Equipment Recommendations  Rolling walker (2 wheels)    Recommendations for Other Services       Precautions / Restrictions Precautions Precautions:  Fall Restrictions Weight Bearing Restrictions Per Provider Order: No LLE Weight Bearing Per Provider Order: Weight bearing as tolerated     Mobility  Bed Mobility Overal bed mobility: Needs Assistance Bed Mobility: Supine to Sit     Supine to sit: Supervision     General bed mobility comments: pt log rolls reporting back limitations, removed bedrail and lowered HOB to flat, able to complete with increased effort and supv for safety    Transfers Overall transfer level: Needs assistance Equipment used: Rolling walker (2 wheels) Transfers: Sit to/from Stand Sit to Stand: Contact guard assist           General transfer comment: CGA for bedside and BSC, cues for hand placement to power up and for controlled return to sitting, minimal LLE asist during transfer    Ambulation/Gait Ambulation/Gait assistance: Contact guard assist Gait Distance (Feet): 160 Feet Assistive device: Rolling walker (2 wheels) Gait Pattern/deviations: Step-to pattern, Antalgic, Trunk flexed, Decreased stance time - left Gait velocity: decreased     General Gait Details: step-to gait patern with decreased LLE stance time, trunk forward flexed improves with cues, slight LE in-toeing but improves with cues, no LE Buckling noted, cues to maintain body within RW frame, increased time and cues with turns to avoid pivoting on surgical leg, requests recliner follow though not needed   Stairs             Wheelchair Mobility     Tilt Bed    Modified Rankin (Stroke Patients Only)       Balance Overall balance assessment: Needs assistance Sitting-balance support: Feet  supported Sitting balance-Leahy Scale: Good     Standing balance support: Bilateral upper extremity supported, During functional activity, Reliant on assistive device for balance Standing balance-Leahy Scale: Poor                              Communication Communication Communication: No apparent difficulties   Cognition Arousal: Alert Behavior During Therapy: WFL for tasks assessed/performed   PT - Cognitive impairments: No apparent impairments                         Following commands: Intact      Cueing    Exercises Total Joint Exercises Ankle Circles/Pumps: AROM, Both, 10 reps, Supine Heel Slides: AROM, Left, Supine, 10 reps Hip ABduction/ADduction: AROM, Left, Supine, 10 reps    General Comments        Pertinent Vitals/Pain Pain Assessment Pain Assessment: 0-10 Pain Score:  (9/10 initially, 7/10 with ambulation, 7/10 at end of session) Pain Location: L hip Pain Descriptors / Indicators: Burning Pain Intervention(s): Limited activity within patient's tolerance, Monitored during session, Premedicated before session, Repositioned, Ice applied    Home Living                          Prior Function            PT Goals (current goals can now be found in the care plan section) Acute Rehab PT Goals Patient Stated Goal: to not have pain PT Goal Formulation: With patient Time For Goal Achievement: 03/02/24 Potential to Achieve Goals: Good Progress towards PT goals: Progressing toward goals    Frequency    7X/week      PT Plan      Co-evaluation              AM-PAC PT 6 Clicks Mobility   Outcome Measure  Help needed turning from your back to your side while in a flat bed without using bedrails?: None Help needed moving from lying on your back to sitting on the side of a flat bed without using bedrails?: A Little Help needed moving to and from a bed to a chair (including a wheelchair)?: A Little Help needed standing up from a chair using your arms (e.g., wheelchair or bedside chair)?: A Little Help needed to walk in hospital room?: A Little Help needed climbing 3-5 steps with a railing? : A Lot 6 Click Score: 18    End of Session Equipment Utilized During Treatment: Gait belt Activity Tolerance: Patient tolerated treatment  well Patient left: in chair;with call bell/phone within reach;with chair alarm set Nurse Communication: Mobility status PT Visit Diagnosis: Unsteadiness on feet (R26.81);Other abnormalities of gait and mobility (R26.89);Muscle weakness (generalized) (M62.81);Difficulty in walking, not elsewhere classified (R26.2);Pain Pain - Right/Left: Left Pain - part of body: Hip;Leg     Time: 8644-8557 PT Time Calculation (min) (ACUTE ONLY): 47 min  Charges:    $Gait Training: 8-22 mins $Therapeutic Exercise: 8-22 mins $Therapeutic Activity: 8-22 mins PT General Charges $$ ACUTE PT VISIT: 1 Visit                     Tori Naif Alabi PT, DPT 02/18/24, 3:13 PM

## 2024-02-18 NOTE — Plan of Care (Signed)
  Problem: Education: Goal: Knowledge of General Education information will improve Description: Including pain rating scale, medication(s)/side effects and non-pharmacologic comfort measures Outcome: Progressing   Problem: Health Behavior/Discharge Planning: Goal: Ability to manage health-related needs will improve Outcome: Progressing   Problem: Clinical Measurements: Goal: Ability to maintain clinical measurements within normal limits will improve Outcome: Progressing Goal: Will remain free from infection Outcome: Progressing Goal: Diagnostic test results will improve Outcome: Progressing Goal: Respiratory complications will improve Outcome: Progressing Goal: Cardiovascular complication will be avoided Outcome: Progressing   Problem: Activity: Goal: Risk for activity intolerance will decrease Outcome: Progressing   Problem: Nutrition: Goal: Adequate nutrition will be maintained Outcome: Completed/Met   Problem: Coping: Goal: Level of anxiety will decrease Outcome: Progressing   Problem: Elimination: Goal: Will not experience complications related to bowel motility Outcome: Progressing Goal: Will not experience complications related to urinary retention Outcome: Completed/Met   Problem: Pain Managment: Goal: General experience of comfort will improve and/or be controlled Outcome: Progressing   Problem: Safety: Goal: Ability to remain free from injury will improve Outcome: Progressing   Problem: Skin Integrity: Goal: Risk for impaired skin integrity will decrease Outcome: Progressing   Problem: Skin Integrity: Goal: Risk for impaired skin integrity will decrease Outcome: Adequate for Discharge   Problem: Tissue Perfusion: Goal: Adequacy of tissue perfusion will improve Outcome: Adequate for Discharge   Problem: Education: Goal: Knowledge of the prescribed therapeutic regimen will improve Outcome: Progressing Goal: Understanding of discharge needs will  improve Outcome: Progressing Goal: Individualized Educational Video(s) Outcome: Completed/Met   Problem: Activity: Goal: Ability to avoid complications of mobility impairment will improve Outcome: Progressing Goal: Ability to tolerate increased activity will improve Outcome: Progressing   Problem: Clinical Measurements: Goal: Postoperative complications will be avoided or minimized Outcome: Progressing   Problem: Pain Management: Goal: Pain level will decrease with appropriate interventions Outcome: Progressing   Problem: Skin Integrity: Goal: Will show signs of wound healing Outcome: Progressing

## 2024-02-18 NOTE — Progress Notes (Signed)
     1 Day Post-Op Procedure(s) (LRB): ARTHROPLASTY, HIP, TOTAL,POSTERIOR APPROACH (Left) Subjective:  Patient reports pain as moderate.  Slept okay.  Moved some with PT yesterday, 30ft with RW and was unhappy that she was left in the chair afterward for some time.  Denies chest pain, ShOB, N/V.  Denies numbness or tingling.  Looking forward to breakfast.  Staying another day due to lack of assistance at home until tomorrow.  No other complaints at this time.  Objective:   VITALS:   Vitals:   02/17/24 1613 02/17/24 1758 02/17/24 2140 02/18/24 0652  BP: (!) 159/77 (!) 150/72 (!) 142/62 129/66  Pulse: 62 73 65 74  Resp: 15 19 18 18   Temp: 97.7 F (36.5 C) 97.8 F (36.6 C) 98.1 F (36.7 C) 97.7 F (36.5 C)  TempSrc: Oral     SpO2: 100% 100% 100% 97%  Weight:      Height:        Sitting comfortably in bed in NAD Neurologically intact Sensation intact distally Intact pulses distally Dorsiflexion/Plantar flexion intact Incision: dressing C/D/I Compartment soft Wiggles toes appropriately   Lab Results  Component Value Date   WBC 4.9 02/09/2024   HGB 14.3 02/09/2024   HCT 43.0 02/09/2024   MCV 93.3 02/09/2024   PLT 210 02/09/2024   BMET    Component Value Date/Time   NA 140 02/09/2024 1434   NA 142 12/16/2016 1517   K 5.2 (H) 02/09/2024 1434   CL 104 02/09/2024 1434   CO2 26 02/09/2024 1434   GLUCOSE 156 (H) 02/09/2024 1434   BUN 18 02/09/2024 1434   BUN 12 12/16/2016 1517   CREATININE 1.13 (H) 02/09/2024 1434   CREATININE 0.86 05/18/2020 1530   CALCIUM  9.9 02/09/2024 1434   GFRNONAA 51 (L) 02/09/2024 1434   GFRNONAA 59 (L) 02/17/2020 1647    Yesterday's total administered Morphine  Milligram Equivalents: 107.5  Xray: stable post-operative imaging  Assessment/Plan: 1 Day Post-Op   Principal Problem:   Primary osteoarthritis of left hip   Labs still not resulted from today, re-ordered.   Post op recs: WB: WBAT LLE, No formal hip precautions Abx:  ancef  Imaging: PACU pelvis Xray Dressing: Aquacell, keep intact until follow up DVT prophylaxis: Eliquis 2.5 mg twice daily starting POD1 Follow up: 2 weeks after surgery for a wound check with Dr. Edna at [facility].  Address: [address], [place], [place] [address]  Office Phone: [phone]   Bernarda CHRISTELLA Mclean 02/18/2024, 7:49 AM   Contact information:   Weekdays 7am-5pm epic message Dr. Edna, or call office for patient follow up: [phone] After hours and holidays please check [email or web address] for group call information for [facility]

## 2024-02-18 NOTE — Progress Notes (Signed)
 Physical Therapy Treatment Patient Details Name: [name] MRN: [id number] DOB: [birth year] Today's Date: 02/18/2024   History of Present Illness 73 yo female presents to therapy s/p L THA, posterior approach on 02/17/2024 due to failure of conservative measures. Pt is currently LLE WBAT and no formal hip precautions. Pt PMH Includes but is not limited to: thyroid  nodules, PVC, humerus fx, hyperthyroidism, CAD s/p CABG x 3, angina, NSTEMI, myopathy, de Quervians dz, sCHF, lumbar and cervical stenosis, depression, RLS, DM II, HLD, GERD, HTN, tinnitus, PTSD and back surgery.    PT Comments  Pt confirms living in single level apartment at [facility] in [place], [place], reports son available to assist at d/c. Pt cued for supine exercises at beginning of session to improve soreness and stiffness, pt tolerates without increased pain. Pt comes to bedside via log roll due to back limitation reports, CGA with cues for sequencing. Pt attempts to power to stand from regular set bed but unable and declines assistance to power up so elevated bed and pt powers up with strong BUE assisting and CGA ,minimal to no LLE weightbearing with rising to stand. Pt amb 110 ft with RW, step-to gait pattern with decreased cadence, no LE Buckling, decreased LLE stance and weightbearing. Plan to continue progressing mobility as able with d/c plan home tomorrow with son available to assist as needed.   If plan is discharge home, recommend the following: A little help with walking and/or transfers;A little help with bathing/dressing/bathroom;Assistance with cooking/housework;Assist for transportation   Can travel by private vehicle        Equipment Recommendations  Rolling walker (2 wheels)    Recommendations for Other Services       Precautions / Restrictions Precautions Precautions: Fall Restrictions Weight Bearing Restrictions Per Provider Order: No     Mobility  Bed Mobility Overal bed mobility: Needs Assistance Bed  Mobility: Supine to Sit     Supine to sit: Contact guard, HOB elevated, Used rails     General bed mobility comments: log roll to come to bedside due to back limits, CGA for safety, cue for sequencing and hand placement    Transfers Overall transfer level: Needs assistance Equipment used: Rolling walker (2 wheels) Transfers: Sit to/from Stand Sit to Stand: Contact guard assist, From elevated surface           General transfer comment: CGA from elevated bedside, cues for hand placement to power up, minimal LLE weightbearing, slow to rise with strong BUE assisting    Ambulation/Gait Ambulation/Gait assistance: Contact guard assist Gait Distance (Feet): 110 Feet Assistive device: Rolling walker (2 wheels) Gait Pattern/deviations: Step-to pattern, Decreased stance time - left, Antalgic, Trunk flexed, Decreased weight shift to left Gait velocity: decreased     General Gait Details: step-to gait pattern with decreased LLE stance and weightbearing, antalgic, trunk forward flexed with improvement when cued but returns to forward flexion due to back limitations, slight LE in-toeing/internal rotation improves to neutral foot posture with cues, no LE buckling noted   Stairs             Wheelchair Mobility     Tilt Bed    Modified Rankin (Stroke Patients Only)       Balance Overall balance assessment: Needs assistance Sitting-balance support: Feet supported Sitting balance-Leahy Scale: Good     Standing balance support: Bilateral upper extremity supported, During functional activity, Reliant on assistive device for balance Standing balance-Leahy Scale: Poor  Communication Communication Communication: No apparent difficulties  Cognition Arousal: Alert Behavior During Therapy: WFL for tasks assessed/performed   PT - Cognitive impairments: No apparent impairments                         Following commands:  Intact      Cueing    Exercises Total Joint Exercises Ankle Circles/Pumps: AROM, Both, 10 reps, Supine Heel Slides: AROM, Left, 5 reps, Supine Hip ABduction/ADduction: AROM, Left, 5 reps, Supine    General Comments        Pertinent Vitals/Pain Pain Assessment Pain Assessment: 0-10 Pain Score: 8  (6/10 initially, 8/10 with amb, 7/10 resting post amb) Pain Location: L hip Pain Descriptors / Indicators: Aching, Constant, Discomfort Pain Intervention(s): Limited activity within patient's tolerance, Monitored during session, Repositioned, Patient requesting pain meds-RN notified, Ice applied    Home Living                          Prior Function            PT Goals (current goals can now be found in the care plan section) Acute Rehab PT Goals Patient Stated Goal: to not have pain PT Goal Formulation: With patient Time For Goal Achievement: 03/02/24 Potential to Achieve Goals: Good Progress towards PT goals: Progressing toward goals    Frequency    7X/week      PT Plan      Co-evaluation              AM-PAC PT 6 Clicks Mobility   Outcome Measure  Help needed turning from your back to your side while in a flat bed without using bedrails?: None Help needed moving from lying on your back to sitting on the side of a flat bed without using bedrails?: A Little Help needed moving to and from a bed to a chair (including a wheelchair)?: A Little Help needed standing up from a chair using your arms (e.g., wheelchair or bedside chair)?: A Little Help needed to walk in hospital room?: A Little Help needed climbing 3-5 steps with a railing? : A Lot 6 Click Score: 18    End of Session Equipment Utilized During Treatment: Gait belt Activity Tolerance: Patient tolerated treatment well Patient left: in chair;with call bell/phone within reach;with chair alarm set;with nursing/sitter in room Nurse Communication: Mobility status;Patient requests pain meds PT  Visit Diagnosis: Unsteadiness on feet (R26.81);Other abnormalities of gait and mobility (R26.89);Muscle weakness (generalized) (M62.81);Difficulty in walking, not elsewhere classified (R26.2);Pain Pain - Right/Left: Left Pain - part of body: Hip;Leg     Time: 9060-8990 PT Time Calculation (min) (ACUTE ONLY): 30 min  Charges:    $Gait Training: 8-22 mins $Therapeutic Exercise: 8-22 mins PT General Charges $$ ACUTE PT VISIT: 1 Visit                     Tori Rhena Glace PT, DPT 02/18/24, 10:25 AM

## 2024-02-18 NOTE — Telephone Encounter (Signed)
 She can stay off Plavix  for now.  But with low dose apixaban, would start ASA 81.  When she stops apixaban, would also stop ASA and go back to Plavix .

## 2024-02-18 NOTE — Telephone Encounter (Signed)
 Pt aware.

## 2024-02-18 NOTE — Plan of Care (Signed)

## 2024-02-19 ENCOUNTER — Other Ambulatory Visit (HOSPITAL_COMMUNITY): Payer: Self-pay

## 2024-02-19 DIAGNOSIS — Z79899 Other long term (current) drug therapy: Secondary | ICD-10-CM | POA: Diagnosis not present

## 2024-02-19 DIAGNOSIS — I110 Hypertensive heart disease with heart failure: Secondary | ICD-10-CM | POA: Diagnosis not present

## 2024-02-19 DIAGNOSIS — M25552 Pain in left hip: Secondary | ICD-10-CM | POA: Diagnosis not present

## 2024-02-19 DIAGNOSIS — M1612 Unilateral primary osteoarthritis, left hip: Secondary | ICD-10-CM | POA: Diagnosis not present

## 2024-02-19 DIAGNOSIS — E1140 Type 2 diabetes mellitus with diabetic neuropathy, unspecified: Secondary | ICD-10-CM | POA: Diagnosis not present

## 2024-02-19 DIAGNOSIS — I2510 Atherosclerotic heart disease of native coronary artery without angina pectoris: Secondary | ICD-10-CM | POA: Diagnosis not present

## 2024-02-19 DIAGNOSIS — I5022 Chronic systolic (congestive) heart failure: Secondary | ICD-10-CM | POA: Diagnosis not present

## 2024-02-19 LAB — GLUCOSE, CAPILLARY
Glucose-Capillary: 140 mg/dL — ABNORMAL HIGH (ref 70–99)
Glucose-Capillary: 147 mg/dL — ABNORMAL HIGH (ref 70–99)
Glucose-Capillary: 135 mg/dL — ABNORMAL HIGH (ref 70–99)
Glucose-Capillary: 129 mg/dL — ABNORMAL HIGH (ref 70–99)

## 2024-02-19 LAB — CBC
HCT: 33 % — ABNORMAL LOW (ref 36.0–46.0)
Hemoglobin: 11.1 g/dL — ABNORMAL LOW (ref 12.0–15.0)
MCH: 31.3 pg (ref 26.0–34.0)
MCHC: 33.6 g/dL (ref 30.0–36.0)
MCV: 93 fL (ref 80.0–100.0)
Platelets: 142 K/uL — ABNORMAL LOW (ref 150–400)
RBC: 3.55 MIL/uL — ABNORMAL LOW (ref 3.87–5.11)
RDW: 12.1 % (ref 11.5–15.5)
WBC: 9.6 K/uL (ref 4.0–10.5)
nRBC: 0 % (ref 0.0–0.2)

## 2024-02-19 NOTE — Discharge Summary (Addendum)
 Physician Discharge Summary  Patient ID: [name] MRN: [id number] DOB/AGE: 08-25-1950 73 y.o.  Admit date: 02/17/2024 Discharge date: 02/22/2024  Admission Diagnoses:  Primary osteoarthritis of left hip  Discharge Diagnoses:  Principal Problem:   Primary osteoarthritis of left hip   Past Medical History:  Diagnosis Date   Adrenal gland cyst    Anxiety    Arthritis    CHF (congestive heart failure) (HCC)    Depression    Diabetes mellitus without complication (HCC)    GERD (gastroesophageal reflux disease)    Heart murmur    History of kidney stones    Hyperlipidemia    Hypertension    Neuromuscular disorder (HCC)    Palpitations    Pneumonia    PONV (postoperative nausea and vomiting)    PTSD (post-traumatic stress disorder)    Ringing of ears, left     Surgeries: Procedure(s): ARTHROPLASTY, HIP, TOTAL,POSTERIOR APPROACH on 02/17/2024   Consultants (if any):   Discharged Condition: Improved  Hospital Course: [name] is an 73 y.o. female who was admitted 02/17/2024 with a diagnosis of Primary osteoarthritis of left hip and went to the operating room on 02/17/2024 and underwent the above named procedures.  Initially slow to mobilize with physical therapy due to pain.  This improved, however then patient developed feelings of anxiousness and jaw pain/chest discomfort.  Hospitalist evaluated patient and initiated workup.  Per hospitalist, findings provided low suspicion for acute cardiac event.  Aspirin  was added to Eliquis DVT prophylaxis and outpatient cardiology follow up recommended in one week.  After anxiety and symptom improvement, able to mobilize better with physical therapy, and patient was stabilized for discharge.  She was given perioperative antibiotics:  Anti-infectives (From admission, onward)    Start     Dose/Rate Route Frequency Ordered Stop   02/17/24 1730  ceFAZolin  (ANCEF ) IVPB 2g/100 mL premix        2 g 200 mL/hr over 30  Minutes Intravenous Every 6 hours 02/17/24 1604 02/18/24 0200   02/17/24 0930  ceFAZolin  (ANCEF ) IVPB 2g/100 mL premix        2 g 200 mL/hr over 30 Minutes Intravenous On call to O.R. 02/17/24 9071 02/17/24 1149     .  She was given sequential compression devices, early ambulation, and eliquis for DVT prophylaxis.  Aspirin  81mg  daily added per hospitalist recommendation to take in collaboration.  She benefited maximally from the hospital stay and there were no complications.    Recent vital signs:  Vitals:   02/18/24 2152 02/19/24 0521  BP: 127/71 (!) 136/59  Pulse: 83 81  Resp: 18 18  Temp: 98.2 F (36.8 C) 98.4 F (36.9 C)  SpO2: 100% 97%    Recent laboratory studies:  Lab Results  Component Value Date   HGB 11.1 (L) 02/19/2024   HGB 11.0 (L) 02/18/2024   HGB 14.3 02/09/2024   Lab Results  Component Value Date   WBC 9.6 02/19/2024   PLT 142 (L) 02/19/2024   Lab Results  Component Value Date   INR 1.4 (H) 11/14/2021   Lab Results  Component Value Date   NA 139 02/18/2024   K 4.3 02/18/2024   CL 105 02/18/2024   CO2 24 02/18/2024   BUN 17 02/18/2024   CREATININE 1.05 (H) 02/18/2024   GLUCOSE 140 (H) 02/18/2024    Discharge Medications:   Allergies as of 02/19/2024       Reactions   Hydromorphone  Nausea And Vomiting, Other (See Comments)  Many years, high-dose made the patient feel spaced out   Morphine  And Codeine Itching   With high doses   Statins    Hx intolerance to multiple statins   Penicillins Hives, Rash        Medication List     PAUSE taking these medications    clopidogrel  75 MG tablet Wait to take this until: March 18, 2024 Commonly known as: PLAVIX  Take 1 tablet (75 mg total) by mouth daily.       STOP taking these medications    ondansetron  4 MG disintegrating tablet Commonly known as: ZOFRAN -ODT       TAKE these medications    acetaminophen  500 MG tablet Commonly known as: TYLENOL  Take 2 tablets (1,000 mg  total) by mouth every 8 (eight) hours as needed.   apixaban 2.5 MG Tabs tablet Commonly known as: Eliquis Take 1 tablet (2.5 mg total) by mouth 2 (two) times daily.   B-D SINGLE USE SWABS REGULAR Pads Use to test blood sugar 3 times daily   celecoxib 100 MG capsule Commonly known as: CeleBREX Take 1 capsule (100 mg total) by mouth 2 (two) times daily for 14 days.   clonazePAM  1 MG tablet Commonly known as: KLONOPIN  Take 1 tablet (1 mg total) by mouth 2 (two) times daily as needed. for anxiety   docusate sodium  100 MG capsule Commonly known as: COLACE Take 200 mg by mouth 3 (three) times daily as needed for mild constipation.   ezetimibe  10 MG tablet Commonly known as: ZETIA  Take 1 tablet (10 mg total) by mouth daily.   fluticasone  50 MCG/ACT nasal spray Commonly known as: FLONASE  Place 2 sprays into both nostrils daily. What changed:  when to take this reasons to take this   furosemide  20 MG tablet Commonly known as: LASIX  TAKE 1 TABLET BY MOUTH ONCE DAILY AS NEEDED   gabapentin  600 MG tablet Commonly known as: NEURONTIN  TAKE 1 TABLET BY MOUTH EVERY MORNING AND AT NOON AND EVERY EVENING AND AT BEDTIME   guaiFENesin  600 MG 12 hr tablet Commonly known as: MUCINEX  Take 600 mg by mouth daily.   HYDROcodone -acetaminophen  10-325 MG tablet Commonly known as: NORCO Take 1 tablet by mouth every 6 (six) hours as needed.   Jardiance  10 MG Tabs tablet Generic drug: empagliflozin  Take 1 tablet by mouth once daily   MAGNESIUM  GLYCINATE PO Take 1 tablet by mouth daily.   Melatonin 10 MG Chew Chew 20 mg by mouth at bedtime.   methocarbamol  500 MG tablet Commonly known as: ROBAXIN  Take 1 tablet (500 mg total) by mouth every 8 (eight) hours as needed for up to 10 days for muscle spasms.   metoprolol  succinate 100 MG 24 hr tablet Commonly known as: TOPROL -XL Take 1 tablet (100 mg total) by mouth 2 (two) times daily.   ondansetron  4 MG tablet Commonly known as:  Zofran  Take 1 tablet (4 mg total) by mouth every 8 (eight) hours as needed for up to 14 days for nausea or vomiting.   onetouch ultrasoft lancets Use to test blood sugar 1-2 times daily.   OneTouch Verio Flex System w/Device Kit Use to test blood sugars 1-2 times daily.   pantoprazole  40 MG tablet Commonly known as: PROTONIX  Take 1 tablet by mouth once daily   polyethylene glycol 17 g packet Commonly known as: MiraLax  Take 17 g by mouth daily.   potassium chloride  SA 20 MEQ tablet Commonly known as: KLOR-CON  M Take 1 tablet by mouth once daily  QUEtiapine  100 MG tablet Commonly known as: SEROQUEL  TAKE 1/2 (ONE-HALF) TABLET BY MOUTH AT BEDTIME   Repatha  SureClick 140 MG/ML Soaj Generic drug: Evolocumab  Inject 140 mg into the skin every 14 (fourteen) days.   rOPINIRole  0.5 MG tablet Commonly known as: REQUIP  TAKE 1 TABLET BY MOUTH AT BEDTIME   sacubitril -valsartan  24-26 MG Commonly known as: Entresto  Take 1 tablet by mouth 2 (two) times daily.   Systane 0.4-0.3 % Soln Generic drug: Polyethyl Glycol-Propyl Glycol Place 1 drop into both eyes daily as needed (Dry eye).   Trulicity  0.75 MG/0.5ML Soaj Generic drug: Dulaglutide  Inject 0.75 mg into the skin once a week.   Ventolin  HFA 108 (90 Base) MCG/ACT inhaler Generic drug: albuterol  INHALE 2 PUFFS BY MOUTH EVERY 6 HOURS AS NEEDED FOR WHEEZING FOR SHORTNESS OF BREATH        Diagnostic Studies: DG HIP UNILAT W OR W/O PELVIS 2-3 VIEWS LEFT Result Date: 02/17/2024 EXAM: 2 or 3 VIEW(S) XRAY OF THE LEFT HIP 02/17/2024 01:57:00 PM COMPARISON: None available. CLINICAL HISTORY: 747648 Post-operative state 252351 Post-operative state FINDINGS: BONES AND JOINTS: Status post left total hip arthroplasty. SOFT TISSUES: Expected postoperative changes seen in the surrounding soft tissues. IMPRESSION: 1. Status post left total hip arthroplasty with expected postoperative changes in the surrounding soft tissues. Electronically  signed by: Lynwood Seip MD 02/17/2024 03:06 PM EST RP Workstation: [id number]   DG Pelvis Portable Result Date: 02/17/2024 EXAM: 1 or 2 VIEW(S) XRAY OF THE PELVIS 02/17/2024 12:30:00 PM COMPARISON: None available. CLINICAL HISTORY: 886218 Surgery, elective [id number] Surgery, elective [phone] FINDINGS: BONES AND JOINTS: The femoral and acetabular components appear to be well situated. SOFT TISSUES: Expected postoperative changes are seen in the surrounding soft tissues. IMPRESSION: 1. Well-positioned femoral and acetabular components with expected postoperative soft tissue changes. Electronically signed by: Lynwood Seip MD 02/17/2024 03:05 PM EST RP Workstation: [id number]    Disposition: Discharge disposition: 01-Home or Self Care       Discharge Instructions     Call MD / Call 911   Complete by: As directed    If you experience chest pain or shortness of breath, CALL 911 and be transported to the hospital emergency room.  If you develope a fever above 101 F, pus (white drainage) or increased drainage or redness at the wound, or calf pain, call your surgeon's office.   Constipation Prevention   Complete by: As directed    Drink plenty of fluids.  Prune juice may be helpful.  You may use a stool softener, such as Colace (over the counter) 100 mg twice a day.  Use MiraLax  (over the counter) for constipation as needed.   Diet - low sodium heart healthy   Complete by: As directed    Increase activity slowly as tolerated   Complete by: As directed    Post-operative opioid taper instructions:   Complete by: As directed    POST-OPERATIVE OPIOID TAPER INSTRUCTIONS: It is important to wean off of your opioid medication as soon as possible. If you do not need pain medication after your surgery it is ok to stop day one. Opioids include: Codeine, Hydrocodone (Norco, Vicodin), Oxycodone (Percocet, oxycontin ) and hydromorphone  amongst others.  Long term and even short term use of opiods can  cause: Increased pain response Dependence Constipation Depression Respiratory depression And more.  Withdrawal symptoms can include Flu like symptoms Nausea, vomiting And more Techniques to manage these symptoms Hydrate well Eat regular healthy meals Stay active Use relaxation techniques(deep breathing, meditating, yoga) Do Not  substitute Alcohol to help with tapering If you have been on opioids for less than two weeks and do not have pain than it is ok to stop all together.  Plan to wean off of opioids This plan should start within one week post op of your joint replacement. Maintain the same interval or time between taking each dose and first decrease the dose.  Cut the total daily intake of opioids by one tablet each day Next start to increase the time between doses. The last dose that should be eliminated is the evening dose.           Follow-up Information     Edna Toribio LABOR, MD Follow up in 2 week(s).   Specialty: Orthopedic Surgery Contact information: [address] [place] [address] [phone]                    Discharge Instructions      INSTRUCTIONS AFTER JOINT REPLACEMENT   Remove items at home which could result in a fall. This includes throw rugs or furniture in walking pathways ICE to the affected joint every three hours while awake for 30 minutes at a time, for at least the first 3-5 days, and then as needed for pain and swelling.  Continue to use ice for pain and swelling. You may notice swelling that will progress down to the foot and ankle.  This is normal after surgery.  Elevate your leg when you are not up walking on it.   Continue to use the breathing machine you got in the hospital (incentive spirometer) which will help keep your temperature down.  It is common for your temperature to cycle up and down following surgery, especially at night when you are not up moving around and exerting yourself.  The breathing  machine keeps your lungs expanded and your temperature down.  DIET:  As you were doing prior to hospitalization, we recommend a well-balanced diet.  DRESSING / WOUND CARE / SHOWERING:  Keep the surgical dressing until follow up.  The dressing is water proof, so you can shower without any extra covering.  IF THE DRESSING FALLS OFF or the wound gets wet inside, change the dressing with sterile gauze.  Please use good hand washing techniques before changing the dressing.  Do not use any lotions or creams on the incision until instructed by your surgeon.    ACTIVITY  Increase activity slowly as tolerated, but follow the weight bearing instructions below.   No driving for 6 weeks or until further direction given by your physician.  You cannot drive while taking narcotics.  No lifting or carrying greater than 10 lbs. until further directed by your surgeon. Avoid periods of inactivity such as sitting longer than an hour when not asleep. This helps prevent blood clots.  You may return to work once you are authorized by your doctor.   WEIGHT BEARING: Weight bearing as tolerated with assist device (walker, cane, etc) as directed, use it as long as suggested by your surgeon or therapist, typically at least 4-6 weeks.  EXERCISES  Results after joint replacement surgery are often greatly improved when you follow the exercise, range of motion and muscle strengthening exercises prescribed by your doctor. Safety measures are also important to protect the joint from further injury. Any time any of these exercises cause you to have increased pain or swelling, decrease what you are doing until you are comfortable again and then slowly increase them. If  you have problems or questions, call your caregiver or physical therapist for advice.   Rehabilitation is important following a joint replacement. After just a few days of immobilization, the muscles of the leg can become weakened and shrink (atrophy).  These  exercises are designed to build up the tone and strength of the thigh and leg muscles and to improve motion. Often times heat used for twenty to thirty minutes before working out will loosen up your tissues and help with improving the range of motion but do not use heat for the first two weeks following surgery (sometimes heat can increase post-operative swelling).   These exercises can be done on a training (exercise) mat, on the floor, on a table or on a bed. Use whatever works the best and is most comfortable for you.    Use music or television while you are exercising so that the exercises are a pleasant break in your day. This will make your life better with the exercises acting as a break in your routine that you can look forward to.   Perform all exercises about fifteen times, three times per day or as directed.  You should exercise both the operative leg and the other leg as well.  Exercises include:   Quad Sets - Tighten up the muscle on the front of the thigh (Quad) and hold for 5-10 seconds.   Straight Leg Raises - With your knee straight (if you were given a brace, keep it on), lift the leg to 60 degrees, hold for 3 seconds, and slowly lower the leg.  Perform this exercise against resistance later as your leg gets stronger.  Leg Slides: Lying on your back, slowly slide your foot toward your buttocks, bending your knee up off the floor (only go as far as is comfortable). Then slowly slide your foot back down until your leg is flat on the floor again.  Angel Wings: Lying on your back spread your legs to the side as far apart as you can without causing discomfort.  Hamstring Strength:  Lying on your back, push your heel against the floor with your leg straight by tightening up the muscles of your buttocks.  Repeat, but this time bend your knee to a comfortable angle, and push your heel against the floor.  You may put a pillow under the heel to make it more comfortable if necessary.   A  rehabilitation program following joint replacement surgery can speed recovery and prevent re-injury in the future due to weakened muscles. Contact your doctor or a physical therapist for more information on knee rehabilitation.   CONSTIPATION:  Constipation is defined medically as fewer than three stools per week and severe constipation as less than one stool per week.  Even if you have a regular bowel pattern at home, your normal regimen is likely to be disrupted due to multiple reasons following surgery.  Combination of anesthesia, postoperative narcotics, change in appetite and fluid intake all can affect your bowels.   YOU MUST use at least one of the following options; they are listed in order of increasing strength to get the job done.  They are all available over the counter, and you may need to use some, POSSIBLY even all of these options:    Drink plenty of fluids (prune juice may be helpful) and high fiber foods Colace 100 mg by mouth twice a day  Senokot for constipation as directed and as needed Dulcolax (bisacodyl ), take with full glass of water  Miralax  (polyethylene glycol) once or twice a day as needed.  If you have tried all these things and are unable to have a bowel movement in the first 3-4 days after surgery call either your surgeon or your primary doctor.    If you experience loose stools or diarrhea, hold the medications until you stool forms back up.  If your symptoms do not get better within 1 week or if they get worse, check with your doctor.  If you experience the worst abdominal pain ever or develop nausea or vomiting, please contact the office immediately for further recommendations for treatment.  ITCHING:  If you experience itching with your medications, try taking only a single pain pill, or even half a pain pill at a time.  You can also use Benadryl  over the counter for itching or also to help with sleep.   TED HOSE STOCKINGS:  Use stockings on both legs until for  at least 2 weeks or as directed by physician office. They may be removed at night for sleeping.  MEDICATIONS:  See your medication summary on the "After Visit Summary" that nursing will review with you.  You may have some home medications which will be placed on hold until you complete the course of blood thinner medication.  It is important for you to complete the blood thinner medication as prescribed.  Blood clot prevention (DVT Prophylaxis): After surgery you are at an increased risk for a blood clot.  You were prescribed a blood thinner, Eliquis, to be taken twice daily for a total of 4 weeks from surgery to help reduce your risk of getting a blood clot.  Signs of a pulmonary embolus (blood clot in the lungs) include sudden short of breath, feeling lightheaded or dizzy, chest pain with a deep breath, rapid pulse rapid breathing.  Signs of a blood clot in your arms or legs include new unexplained swelling and cramping, warm, red or darkened skin around the painful area.  Please call the office or 911 right away if these signs or symptoms develop.  PRECAUTIONS:   If you experience chest pain or shortness of breath - call 911 immediately for transfer to the hospital emergency department.   If you develop a fever greater that 101 F, purulent drainage from wound, increased redness or drainage from wound, foul odor from the wound/dressing, or calf pain - CONTACT YOUR SURGEON.                                                   FOLLOW-UP APPOINTMENTS:  If you do not already have a post-op appointment, please call the office for an appointment to be seen by your surgeon.  Guidelines for how soon to be seen are listed in your "After Visit Summary", but are typically between 2-3 weeks after surgery.  If you have a specialized bandage, you may be told to follow up 1 week after surgery.  POST-OPERATIVE OPIOID TAPER INSTRUCTIONS: It is important to wean off of your opioid medication as soon as possible. If you do  not need pain medication after your surgery it is ok to stop day one. Opioids include: Codeine, Hydrocodone (Norco, Vicodin), Oxycodone (Percocet, oxycontin ) and hydromorphone  amongst others.  Long term and even short term use of opiods can cause: Increased pain response Dependence Constipation Depression Respiratory depression And more.  Withdrawal symptoms can  include Flu like symptoms Nausea, vomiting And more Techniques to manage these symptoms Hydrate well Eat regular healthy meals Stay active Use relaxation techniques(deep breathing, meditating, yoga) Do Not substitute Alcohol to help with tapering If you have been on opioids for less than two weeks and do not have pain than it is ok to stop all together.  Plan to wean off of opioids This plan should start within one week post op of your joint replacement. Maintain the same interval or time between taking each dose and first decrease the dose.  Cut the total daily intake of opioids by one tablet each day Next start to increase the time between doses. The last dose that should be eliminated is the evening dose.   MAKE SURE YOU:  Understand these instructions.  Get help right away if you are not doing well or get worse.    Thank you for letting us  be a part of your medical care team.  It is a privilege we respect greatly.  We hope these instructions will help you stay on track for a fast and full recovery!      ------------------------------------------------------------------------------  Information on my medicine - ELIQUIS (apixaban)  Why was Eliquis prescribed for you? Eliquis was prescribed for you to reduce the risk of blood clots forming after orthopedic surgery.    What do You need to know about Eliquis? Take your Eliquis TWICE DAILY - one tablet in the morning and one tablet in the evening with or without food.  It would be best to take the dose about the same time each day.  If you have difficulty  swallowing the tablet whole please discuss with your pharmacist how to take the medication safely.  Take Eliquis exactly as prescribed by your doctor and DO NOT stop taking Eliquis without talking to the doctor who prescribed the medication.  Stopping without other medication to take the place of Eliquis may increase your risk of developing a clot.  After discharge, you should have regular check-up appointments with your healthcare provider that is prescribing your Eliquis.  What do you do if you miss a dose? If a dose of ELIQUIS is not taken at the scheduled time, take it as soon as possible on the same day and twice-daily administration should be resumed.  The dose should not be doubled to make up for a missed dose.  Do not take more than one tablet of ELIQUIS at the same time.  Important Safety Information A possible side effect of Eliquis is bleeding. You should call your healthcare provider right away if you experience any of the following: Bleeding from an injury or your nose that does not stop. Unusual colored urine (red or dark brown) or unusual colored stools (red or black). Unusual bruising for unknown reasons. A serious fall or if you hit your head (even if there is no bleeding).  Some medicines may interact with Eliquis and might increase your risk of bleeding or clotting while on Eliquis. To help avoid this, consult your healthcare provider or pharmacist prior to using any new prescription or non-prescription medications, including herbals, vitamins, non-steroidal anti-inflammatory drugs (NSAIDs) and supplements.  This website has more information on Eliquis (apixaban): http://www.eliquis.com/eliquis/home       Signed: Bernarda CHRISTELLA Mclean 02/22/2024, 7:18 AM

## 2024-02-19 NOTE — Plan of Care (Signed)
  Problem: Health Behavior/Discharge Planning: Goal: Ability to manage health-related needs will improve Outcome: Progressing   Problem: Clinical Measurements: Goal: Ability to maintain clinical measurements within normal limits will improve Outcome: Progressing Goal: Will remain free from infection Outcome: Progressing Goal: Diagnostic test results will improve Outcome: Progressing Goal: Respiratory complications will improve Outcome: Progressing Goal: Cardiovascular complication will be avoided Outcome: Progressing   Problem: Activity: Goal: Risk for activity intolerance will decrease Outcome: Progressing   Problem: Coping: Goal: Level of anxiety will decrease Outcome: Progressing   Problem: Elimination: Goal: Will not experience complications related to bowel motility Outcome: Progressing   Problem: Pain Managment: Goal: General experience of comfort will improve and/or be controlled Outcome: Progressing   Problem: Safety: Goal: Ability to remain free from injury will improve Outcome: Progressing   Problem: Skin Integrity: Goal: Risk for impaired skin integrity will decrease Outcome: Progressing   Problem: Coping: Goal: Ability to adjust to condition or change in health will improve Outcome: Progressing   Problem: Fluid Volume: Goal: Ability to maintain a balanced intake and output will improve Outcome: Progressing   Problem: Metabolic: Goal: Ability to maintain appropriate glucose levels will improve Outcome: Progressing   Problem: Nutritional: Goal: Maintenance of adequate nutrition will improve Outcome: Progressing   Problem: Skin Integrity: Goal: Risk for impaired skin integrity will decrease Outcome: Progressing   Problem: Tissue Perfusion: Goal: Adequacy of tissue perfusion will improve Outcome: Progressing   Problem: Education: Goal: Knowledge of the prescribed therapeutic regimen will improve Outcome: Progressing   Problem:  Activity: Goal: Ability to avoid complications of mobility impairment will improve Outcome: Progressing Goal: Ability to tolerate increased activity will improve Outcome: Progressing   Problem: Pain Management: Goal: Pain level will decrease with appropriate interventions Outcome: Progressing

## 2024-02-19 NOTE — Progress Notes (Signed)
 PHYSICAL THERAPY  Attempted to see twice this morning  Groggy to wake, room dark.  Pt stated she could'nt get up right now.  C/O chest Tightness.  Secure chat sent to RN Pt in recliner assisted by Nursing now c/o Nausea.  Secure chat sent to RN  Will attempt to see later today.  Katheryn Leap  PTA Acute  Rehabilitation Services Office M-F          [phone]

## 2024-02-19 NOTE — Progress Notes (Signed)
 PHYSICAL THERAPY  Attempted to see twice this afternoon  Upon entering room, Pt on [facility] requesting more time Pt back in bed as visibly feeling poorly. Pt stated, it's the Dilaudid  IV, I'm not suppose to take that.  It's in my chart under allergies.  Pt also c/o 9/10 hip pain. Secure chat sent to Nurse that Pt would like something for pain but won't make her feel more sick. Pt stated the only thing she ate today was a piece of toast.    Pt was unable to participate in Therapy today.  Physical Therapy to resume tomorrow.  Pt  did not meet her mobility goals due to nausea and overwhelming feeling of illness.  Katheryn Leap  PTA Acute  Rehabilitation Services Office M-F          [phone]

## 2024-02-19 NOTE — Progress Notes (Signed)
     2 Days Post-Op Procedure(s) (LRB): ARTHROPLASTY, HIP, TOTAL,POSTERIOR APPROACH (Left) Subjective:  Patient reports pain as improving.  She is in good spirits this morning.  She mobilized 160 feet with physical therapy.  Anticipate discharge home today.  Objective:   VITALS:   Vitals:   02/18/24 1342 02/18/24 2019 02/18/24 2152 02/19/24 0521  BP: (!) 103/50 (!) 142/78 127/71 (!) 136/59  Pulse: 83 80 83 81  Resp: 16  18 18   Temp: 98.1 F (36.7 C)  98.2 F (36.8 C) 98.4 F (36.9 C)  TempSrc: Oral  Oral Oral  SpO2: 97%  100% 97%  Weight:      Height:        Sitting comfortably in bed in NAD Neurologically intact Sensation intact distally Intact pulses distally Dorsiflexion/Plantar flexion intact Incision: dressing C/D/I Compartment soft Wiggles toes appropriately   Lab Results  Component Value Date   WBC 9.6 02/19/2024   HGB 11.1 (L) 02/19/2024   HCT 33.0 (L) 02/19/2024   MCV 93.0 02/19/2024   PLT 142 (L) 02/19/2024   BMET    Component Value Date/Time   NA 139 02/18/2024 0823   NA 142 12/16/2016 1517   K 4.3 02/18/2024 0823   CL 105 02/18/2024 0823   CO2 24 02/18/2024 0823   GLUCOSE 140 (H) 02/18/2024 0823   BUN 17 02/18/2024 0823   BUN 12 12/16/2016 1517   CREATININE 1.05 (H) 02/18/2024 0823   CREATININE 0.86 05/18/2020 1530   CALCIUM  8.8 (L) 02/18/2024 0823   GFRNONAA 56 (L) 02/18/2024 0823   GFRNONAA 59 (L) 02/17/2020 1647    Yesterday's total administered Morphine  Milligram Equivalents: 20  Xray: stable post-operative imaging  Assessment/Plan: 2 Days Post-Op   Principal Problem:   Primary osteoarthritis of left hip     Post op recs: WB: WBAT LLE, No formal hip precautions Abx: ancef  Imaging: PACU pelvis Xray Dressing: Aquacell, keep intact until follow up DVT prophylaxis: Eliquis 2.5 mg twice daily starting POD1 Follow up: 2 weeks after surgery for a wound check with Dr. Edna at [facility].  Address: [address], [place], [place] [address]  Office Phone: [phone]   TORIBIO DELENA EDNA 02/19/2024, 6:49 AM   Contact information:   Weekdays 7am-5pm epic message Dr. Edna, or call office for patient follow up: [phone] After hours and holidays please check [email or web address] for group call information for [facility]

## 2024-02-20 DIAGNOSIS — Z79899 Other long term (current) drug therapy: Secondary | ICD-10-CM | POA: Diagnosis not present

## 2024-02-20 DIAGNOSIS — I2510 Atherosclerotic heart disease of native coronary artery without angina pectoris: Secondary | ICD-10-CM | POA: Diagnosis not present

## 2024-02-20 DIAGNOSIS — E1140 Type 2 diabetes mellitus with diabetic neuropathy, unspecified: Secondary | ICD-10-CM | POA: Diagnosis not present

## 2024-02-20 DIAGNOSIS — M25552 Pain in left hip: Secondary | ICD-10-CM | POA: Diagnosis not present

## 2024-02-20 DIAGNOSIS — M1612 Unilateral primary osteoarthritis, left hip: Secondary | ICD-10-CM | POA: Diagnosis not present

## 2024-02-20 DIAGNOSIS — I5022 Chronic systolic (congestive) heart failure: Secondary | ICD-10-CM | POA: Diagnosis not present

## 2024-02-20 DIAGNOSIS — I110 Hypertensive heart disease with heart failure: Secondary | ICD-10-CM | POA: Diagnosis not present

## 2024-02-20 LAB — GLUCOSE, CAPILLARY
Glucose-Capillary: 115 mg/dL — ABNORMAL HIGH (ref 70–99)
Glucose-Capillary: 112 mg/dL — ABNORMAL HIGH (ref 70–99)
Glucose-Capillary: 125 mg/dL — ABNORMAL HIGH (ref 70–99)
Glucose-Capillary: 133 mg/dL — ABNORMAL HIGH (ref 70–99)

## 2024-02-20 MED ORDER — ASPIRIN 81 MG PO TBEC
81.0000 mg | DELAYED_RELEASE_TABLET | Freq: Every day | ORAL | Status: DC
  Administered 2024-02-21 – 2024-02-22 (×2): 81 mg via ORAL
  Filled 2024-02-20 (×2): qty 1

## 2024-02-20 MED ORDER — SODIUM CHLORIDE 0.9 % IV BOLUS
500.0000 mL | Freq: Once | INTRAVENOUS | Status: AC
  Administered 2024-02-20: 500 mL via INTRAVENOUS

## 2024-02-20 MED ORDER — SODIUM CHLORIDE 0.9 % IV BOLUS: 1000.0000 mL | Freq: Once | INTRAVENOUS | Status: DC

## 2024-02-20 NOTE — Progress Notes (Signed)
 Physical Therapy Treatment Patient Details Name: [name] MRN: [id number] DOB: 1951/04/02 Today's Date: 02/20/2024   History of Present Illness 73 yo female presents to therapy s/p L THA, posterior approach on 02/17/2024 due to failure of conservative measures. Pt is currently LLE WBAT and no formal hip precautions. Pt PMH Includes but is not limited to: thyroid  nodules, PVC, humerus fx, hyperthyroidism, CAD s/p CABG x 3, angina, NSTEMI, myopathy, de Quervians dz, sCHF, lumbar and cervical stenosis, depression, RLS, DM II, HLD, GERD, HTN, tinnitus, PTSD and back surgery.    PT Comments  Pt reports having a horrible day yesterday and feeling some better today.  However, pt requiring max A to stand and was found to have orthostatic hypotension (see comments) and required return to supine. Nursing notified MD/PA about orthostatic BP and pt's medication concerns.  Pt did participate in supine and seated exercises prior to standing.  Required increased time for all exercises and transfers for pain control and education.  Plan to f/u in afternoon.  Pt with multiple concerns/questions/complaints about all aspects of care - notified nursing director pt requesting rounding.  Also, see comments in exercises regarding therapy concerns.    If plan is discharge home, recommend the following: A little help with walking and/or transfers;A little help with bathing/dressing/bathroom;Assistance with cooking/housework;Assist for transportation   Can travel by private vehicle        Equipment Recommendations  Rolling walker (2 wheels)    Recommendations for Other Services       Precautions / Restrictions Precautions Precautions: Fall Precaution/Restrictions Comments: NO hip precutions Restrictions LLE Weight Bearing Per Provider Order: Weight bearing as tolerated     Mobility  Bed Mobility Overal bed mobility: Needs Assistance Bed Mobility: Supine to Sit, Sit to Supine     Supine to sit:  Mod assist, HOB elevated, Used rails Sit to supine: Total assist, +2 for physical assistance   General bed mobility comments: To sit: increased time, cues, would have pt slide legs some, move hips and shoulders, then repeat to get to EOB, then assisted pt in lifting trunk and completing movement of L LE.  She was too painful/weak today to attempt on her own with gait belt.  For supine: Pt became orthostatic and required total A x 2 helicopter technique, legs maintained in good neutral postion together and moving with trunk, (no IR/ER or abduction), smooth transtion but pt with complaints too quick.  Educated needed to get back to supine efficiently due to low BP.    Transfers Overall transfer level: Needs assistance Equipment used: Rolling walker (2 wheels) Transfers: Sit to/from Stand Sit to Stand: Max assist, From elevated surface           General transfer comment: Bed significantly elevated requiring max cues, multiple attempts , and max A to rise.  Pt not able to tuck bottom and stand upright, heavy posterior lean and had to return to supine.  Found to have low BP.    Ambulation/Gait               General Gait Details: unable - orthostatic   Stairs             Wheelchair Mobility     Tilt Bed    Modified Rankin (Stroke Patients Only)       Balance Overall balance assessment: Needs assistance Sitting-balance support: Feet supported Sitting balance-Leahy Scale: Good     Standing balance support: Bilateral upper extremity supported Standing balance-Leahy Scale: Poor Standing  balance comment: max A ;posterior lean                            Communication    Cognition Arousal: Alert Behavior During Therapy: Anxious   PT - Cognitive impairments: No apparent impairments                       PT - Cognition Comments: Pt with multiple complaints/questions about all aspects of care (meds, sheet changes, adls, EVS, therapy doing things  slightly different from each other, etc) - did require redirecting at times to focus on therapy and assured that therapist would f/u with nursing management about other concerns        Cueing    Exercises Total Joint Exercises Ankle Circles/Pumps: AROM, Both, 10 reps, Supine Quad Sets: AROM, Both, 10 reps, Supine Heel Slides: AROM, Left, Supine, 10 reps, Limitations Heel Slides Limitations: Used gait belt to assist, also min A from therapist; limited ROM to comfort level Hip ABduction/ADduction: AROM, Left, Supine, 10 reps, Limitations Hip Abduction/Adduction Limitations: Used gait belt to assist Other Exercises Other Exercises: Pt with complaints that every therapist does something different and it all should be standardized to not confuse pt's.  Educated that things can be altered slightly as everyone has different methods (that are still safe) and that we adjust our methods even within the same therapist to meet pt's needs.  Not able to standardize everything as pts have different needs.  Did educate that we would provide a written HEP at d/c so that she would know exactly what to do , but that we don't typically supply that until d/c as we are continually progressing her.  This seemed to calm her immediate concerns.    General Comments General comments (skin integrity, edema, etc.): Upon sitting pt had some complaints of nausea.  Checked and BP was 103/58 but she denied any lightheadedness; HR 60, O2 97%.  Attempted standing but pt weak and unable to stand completely upright.  Had her return to sitting.  Started BP again and pt complaining of seeing spots -BP was 67/48 with MAP 56.  Nurse was present so pt returned to supine.  In supine , pt's symptoms improved and BP 109/52.  Left pt in nursing care.      Pertinent Vitals/Pain Pain Assessment Pain Assessment: Faces Faces Pain Scale: Hurts even more Pain Location: L hip-with transfers, no pain at rest Pain Descriptors / Indicators:  Discomfort, Sharp, Tightness Pain Intervention(s): Limited activity within patient's tolerance, Monitored during session, Repositioned, RN gave pain meds during session, Ice applied (offered to let RN give meds and PT would return but pt opted to have meds during session in order to get it over with.)    Home Living                          Prior Function            PT Goals (current goals can now be found in the care plan section) Progress towards PT goals: Not progressing toward goals - comment (orthostatic)    Frequency    7X/week      PT Plan      Co-evaluation              AM-PAC PT 6 Clicks Mobility   Outcome Measure  Help needed turning from your back to your side while  in a flat bed without using bedrails?: None Help needed moving from lying on your back to sitting on the side of a flat bed without using bedrails?: A Lot Help needed moving to and from a bed to a chair (including a wheelchair)?: Total Help needed standing up from a chair using your arms (e.g., wheelchair or bedside chair)?: Total Help needed to walk in hospital room?: Total Help needed climbing 3-5 steps with a railing? : Total 6 Click Score: 10    End of Session Equipment Utilized During Treatment: Gait belt Activity Tolerance: Treatment limited secondary to medical complications (Comment) Patient left: with call bell/phone within reach;in bed;with nursing/sitter in room;with SCD's reapplied;with bed alarm set Nurse Communication: Mobility status;Other (comment) (RN present for pt's concerns regarding meds and for orthostatic hypotension.  Notified director that pt request rounding.  Notified EVS that pt request floor cleaned.) PT Visit Diagnosis: Unsteadiness on feet (R26.81);Other abnormalities of gait and mobility (R26.89);Muscle weakness (generalized) (M62.81);Difficulty in walking, not elsewhere classified (R26.2);Pain Pain - Right/Left: Left     Time: 9085-8992 PT Time  Calculation (min) (ACUTE ONLY): 53 min  Charges:    $Therapeutic Exercise: 23-37 mins $Therapeutic Activity: 23-37 mins PT General Charges $$ ACUTE PT VISIT: 1 Visit                     Benjiman, PT Acute Rehab [facility] [phone]    Benjiman VEAR Mulberry 02/20/2024, 11:25 AM

## 2024-02-20 NOTE — Progress Notes (Addendum)
     3 Days Post-Op Procedure(s) (LRB): ARTHROPLASTY, HIP, TOTAL,POSTERIOR APPROACH (Left) Subjective:  Reports yesterday was a bad day, frustrated that she was given dilaudid  and feels like this caused her to feel bad yesterday. This has been discontinued. Does not feel like she's had a good postoperative experience, frustrated that she is not able to get immediate help when she has to urinate due to her baseline urinary incontinence. She did not eat much yesterday but has had no nausea or vomiting this morning. She is hopeful that she will feel better working with PT today.    Objective:   VITALS:   Vitals:   02/18/24 1342 02/18/24 2019 02/18/24 2152 02/19/24 0521  BP: (!) 103/50 (!) 142/78 127/71 (!) 136/59  Pulse: 83 80 83 81  Resp: 16  18 18   Temp: 98.1 F (36.7 C)  98.2 F (36.8 C) 98.4 F (36.9 C)  TempSrc: Oral  Oral Oral  SpO2: 97%  100% 97%  Weight:      Height:        laying comfortably in bed in NAD Neurologically intact Sensation intact distally Intact pulses distally Dorsiflexion/Plantar flexion intact Incision: dressing C/D/I Compartment soft Wiggles toes appropriately   Lab Results  Component Value Date   WBC 9.6 02/19/2024   HGB 11.1 (L) 02/19/2024   HCT 33.0 (L) 02/19/2024   MCV 93.0 02/19/2024   PLT 142 (L) 02/19/2024   BMET    Component Value Date/Time   NA 139 02/18/2024 0823   NA 142 12/16/2016 1517   K 4.3 02/18/2024 0823   CL 105 02/18/2024 0823   CO2 24 02/18/2024 0823   GLUCOSE 140 (H) 02/18/2024 0823   BUN 17 02/18/2024 0823   BUN 12 12/16/2016 1517   CREATININE 1.05 (H) 02/18/2024 0823   CREATININE 0.86 05/18/2020 1530   CALCIUM  8.8 (L) 02/18/2024 0823   GFRNONAA 56 (L) 02/18/2024 0823   GFRNONAA 59 (L) 02/17/2020 1647    Yesterday's total administered Morphine  Milligram Equivalents: 20  Xray: stable post-operative imaging  Assessment/Plan: 3 Days Post-Op   Principal Problem:   Primary osteoarthritis of left  hip  Post op recs: WB: WBAT LLE, No formal hip precautions Abx: ancef  Imaging: PACU pelvis Xray Dressing: Aquacell, keep intact until follow up DVT prophylaxis: Eliquis 2.5 mg twice daily starting POD1 Follow up: 2 weeks after surgery for a wound check with Dr. Edna at [facility].  Address: [address], [place], [place] [address]  Office Phone: [phone]  - passed on to nursing staff patient's complaints about feeling like room smells due to bedpan not being washed out in sink after use.  - encouraged patient to eat and drink well today  - able to d/c home today if able to pass PT    Contact information:   Weekdays 7am-5pm epic message Dr. Edna, or call office for patient follow up: [phone] After hours and holidays please check [email or web address] for group call information for [facility]

## 2024-02-20 NOTE — Progress Notes (Signed)
 Physical Therapy Treatment Patient Details Name: [name] MRN: [id number] DOB: 12-17-1950 Today's Date: 02/20/2024   History of Present Illness 73 yo female presents to therapy s/p L THA, posterior approach on 02/17/2024 due to failure of conservative measures. Pt is currently LLE WBAT and no formal hip precautions. Pt PMH Includes but is not limited to: thyroid  nodules, PVC, humerus fx, hyperthyroidism, CAD s/p CABG x 3, angina, NSTEMI, myopathy, de Quervians dz, sCHF, lumbar and cervical stenosis, depression, RLS, DM II, HLD, GERD, HTN, tinnitus, PTSD and back surgery.    PT Comments  Pt received a bolus of fluid per RN and reports feeling better.  Blood pressure was stable with transfers and pt able to ambulate 31' with RW and CGA this afternoon, min A for transfers.  She does have some pain with transfers but eases at rest and tolerated exercises well. She would benefit from further therapy prior to return home.     If plan is discharge home, recommend the following: A little help with walking and/or transfers;A little help with bathing/dressing/bathroom;Assistance with cooking/housework;Assist for transportation   Can travel by private vehicle        Equipment Recommendations  Rolling walker (2 wheels)    Recommendations for Other Services       Precautions / Restrictions Precautions Precautions: Fall Precaution/Restrictions Comments: NO hip precutions Restrictions LLE Weight Bearing Per Provider Order: Weight bearing as tolerated     Mobility  Bed Mobility Overal bed mobility: Needs Assistance Bed Mobility: Supine to Sit     Supine to sit: Min assist, HOB elevated, Used rails Sit to supine: Total assist, +2 for physical assistance   General bed mobility comments: Increased time and use gait belt to assist L LE; PT assisting L LE down from bed; pt planning to sleep in recliner at home    Transfers Overall transfer level: Needs assistance Equipment used:  Rolling walker (2 wheels) Transfers: Sit to/from Stand Sit to Stand: Min assist, From elevated surface           General transfer comment: Cues for hand placement; light min A to stabilize when standing and moving hands to RW; increased time    Ambulation/Gait Ambulation/Gait assistance: Contact guard assist Gait Distance (Feet): 18 Feet Assistive device: Rolling walker (2 wheels) Gait Pattern/deviations: Step-to pattern, Antalgic, Trunk flexed, Decreased dorsiflexion - left, Decreased weight shift to left, Decreased stance time - left Gait velocity: decreased     General Gait Details: Very little weight on L LE, antalgic pattern with foot plantar flexed , cues for RW and weight bearing as able   Stairs             Wheelchair Mobility     Tilt Bed    Modified Rankin (Stroke Patients Only)       Balance Overall balance assessment: Needs assistance Sitting-balance support: Feet supported Sitting balance-Leahy Scale: Good     Standing balance support: Bilateral upper extremity supported Standing balance-Leahy Scale: Poor Standing balance comment: RW and CGA                            Communication    Cognition Arousal: Alert Behavior During Therapy: Anxious   PT - Cognitive impairments: No apparent impairments                       PT - Cognition Comments: some anxiety but more focused on therapy this afternoon  Cueing    Exercises Total Joint Exercises Ankle Circles/Pumps: AROM, Both, 10 reps, Supine Quad Sets: AROM, Both, 10 reps, Supine Heel Slides: Left, Supine, 10 reps, Limitations, AAROM Heel Slides Limitations: Used gait belt to assist; limited ROM to comfort level Hip ABduction/ADduction: Left, 10 reps, Limitations, AAROM, Supine Hip Abduction/Adduction Limitations: Used gait belt to assist Other Exercises Other Exercises: Held standing exercises today for pain control and focused on mobility    General  Comments General comments (skin integrity, edema, etc.): Received a bolus of fluid and reports feeling better.  BP supine 129/60, sitting 117/78, standing 114/68      Pertinent Vitals/Pain Pain Assessment Pain Assessment: Faces Faces Pain Scale: Hurts even more Pain Location: L hip-with transfers, no pain at rest Pain Descriptors / Indicators: Discomfort, Sharp, Tightness Pain Intervention(s): Limited activity within patient's tolerance, Monitored during session, Premedicated before session, Repositioned, Ice applied    Home Living                          Prior Function            PT Goals (current goals can now be found in the care plan section) Progress towards PT goals: Progressing toward goals    Frequency    7X/week      PT Plan      Co-evaluation              AM-PAC PT 6 Clicks Mobility   Outcome Measure  Help needed turning from your back to your side while in a flat bed without using bedrails?: None Help needed moving from lying on your back to sitting on the side of a flat bed without using bedrails?: A Lot Help needed moving to and from a bed to a chair (including a wheelchair)?: A Little Help needed standing up from a chair using your arms (e.g., wheelchair or bedside chair)?: A Little Help needed to walk in hospital room?: A Little Help needed climbing 3-5 steps with a railing? : A Lot 6 Click Score: 17    End of Session Equipment Utilized During Treatment: Gait belt Activity Tolerance: Patient tolerated treatment well Patient left: with call bell/phone within reach;in chair;with family/visitor present Nurse Communication: Mobility status PT Visit Diagnosis: Unsteadiness on feet (R26.81);Other abnormalities of gait and mobility (R26.89);Muscle weakness (generalized) (M62.81);Difficulty in walking, not elsewhere classified (R26.2);Pain Pain - Right/Left: Left Pain - part of body: Hip;Leg     Time: 1329-1400 PT Time Calculation  (min) (ACUTE ONLY): 31 min  Charges:    $Gait Training: 8-22 mins $Therapeutic Exercise: 8-22 mins  PT General Charges $$ ACUTE PT VISIT: 1 Visit                     Benjiman, PT Acute Rehab [facility] [phone]    Benjiman VEAR Mulberry 02/20/2024, 2:39 PM

## 2024-02-21 ENCOUNTER — Other Ambulatory Visit: Payer: Self-pay

## 2024-02-21 ENCOUNTER — Other Ambulatory Visit (HOSPITAL_COMMUNITY): Payer: Self-pay

## 2024-02-21 DIAGNOSIS — E1140 Type 2 diabetes mellitus with diabetic neuropathy, unspecified: Secondary | ICD-10-CM | POA: Diagnosis not present

## 2024-02-21 DIAGNOSIS — M25552 Pain in left hip: Secondary | ICD-10-CM | POA: Diagnosis not present

## 2024-02-21 DIAGNOSIS — Z79899 Other long term (current) drug therapy: Secondary | ICD-10-CM | POA: Diagnosis not present

## 2024-02-21 DIAGNOSIS — M1612 Unilateral primary osteoarthritis, left hip: Principal | ICD-10-CM

## 2024-02-21 DIAGNOSIS — I5022 Chronic systolic (congestive) heart failure: Secondary | ICD-10-CM | POA: Diagnosis not present

## 2024-02-21 DIAGNOSIS — I2510 Atherosclerotic heart disease of native coronary artery without angina pectoris: Secondary | ICD-10-CM | POA: Diagnosis not present

## 2024-02-21 DIAGNOSIS — I110 Hypertensive heart disease with heart failure: Secondary | ICD-10-CM | POA: Diagnosis not present

## 2024-02-21 LAB — CK TOTAL AND CKMB (NOT AT ARMC)
CK, MB: 0.9 ng/mL (ref 0.5–5.0)
Total CK: 135 U/L (ref 38–234)

## 2024-02-21 LAB — TROPONIN T, HIGH SENSITIVITY
Troponin T High Sensitivity: <15 ng/L (ref 0–19)
Troponin T High Sensitivity: 48 ng/L — ABNORMAL HIGH (ref 0–19)
Troponin T High Sensitivity: <15 ng/L (ref 0–19)

## 2024-02-21 LAB — GLUCOSE, CAPILLARY
Glucose-Capillary: 114 mg/dL — ABNORMAL HIGH (ref 70–99)
Glucose-Capillary: 120 mg/dL — ABNORMAL HIGH (ref 70–99)
Glucose-Capillary: 145 mg/dL — ABNORMAL HIGH (ref 70–99)
Glucose-Capillary: 153 mg/dL — ABNORMAL HIGH (ref 70–99)

## 2024-02-21 LAB — LACTATE DEHYDROGENASE: LDH: 215 U/L (ref 105–235)

## 2024-02-21 MED ORDER — ASPIRIN 81 MG PO TBEC
81.0000 mg | DELAYED_RELEASE_TABLET | Freq: Every day | ORAL | 0 refills | Status: AC
  Filled 2024-02-21: qty 30, 30d supply, fill #0

## 2024-02-21 NOTE — Progress Notes (Signed)
 Physical Therapy Treatment Patient Details Name: [name] MRN: [id number] DOB: 1951-02-13 Today's Date: 02/21/2024   History of Present Illness 73 yo female presents to therapy s/p L THA, posterior approach on 02/17/2024 due to failure of conservative measures. Pt is currently LLE WBAT and no formal hip precautions. Pt PMH Includes but is not limited to: thyroid  nodules, PVC, humerus fx, hyperthyroidism, CAD s/p CABG x 3, angina, NSTEMI, myopathy, de Quervians dz, sCHF, lumbar and cervical stenosis, depression, RLS, DM II, HLD, GERD, HTN, tinnitus, PTSD and back surgery.    PT Comments  Upon entering,  pt must have previously pushed call bell because nurse entered immediately after me asking what pt needed. Pt was sitting in recliner stating she was not feeling right, she felt dizzy. Nurse and I tried to assess what she was saying and address. I told hr BPs at this time and all vitals within normal limits, however pt very anxious about  what is happening to me , then began to cry. Pt stated she was having a panic attack , so allowed it to resolve and she settled. We worked on ambulation in hallway with RW , with improved ability, no reports of pain at this time , so pain was in tolerable state.   Discussed ability to get from car to her apartment. She does have to park, and then step up on curb to enter the building. So we will practice 1 step up tomorrow.   Discussed car transfers with her with education and demo. Pt concerned about getting in bed, and attempted to practice bed mobility however pt began to panic, so we decided it would be fine to stay in recliner until ready for bed mobility. Pt was given a leg lifter and reacher for equipment to assist with LE in bed and for picking up items when necessary. Was not able to use them during this session do to events that needed us  to stop. At this point, pt was not ready and comfortable with DC home, wanted to rest and take her anxious  medicines.   Will see patient tomorrow around 10:30 am.    If plan is discharge home, recommend the following: A little help with walking and/or transfers;A little help with bathing/dressing/bathroom;Assistance with cooking/housework;Assist for transportation   Can travel by private vehicle        Equipment Recommendations  Rolling walker (2 wheels)    Recommendations for Other Services       Precautions / Restrictions Precautions Precautions: Fall Precaution/Restrictions Comments: NO hip precutions Restrictions Weight Bearing Restrictions Per Provider Order: No LLE Weight Bearing Per Provider Order: Weight bearing as tolerated     Mobility  Bed Mobility Overal bed mobility: Needs Assistance Bed Mobility: Sit to Supine     Supine to sit: Used rails Sit to supine: Mod assist, HOB elevated, Used rails   General bed mobility comments: pt with great anxious with getting back into bed, sit to supine. Originally pt requested  to practice sit to supine and bed mobility stating she was most concerned about this and car transfer. however when we began to practice it, she began to panic again and guard her LE in extension making it challenging  to practice. At this time, we shifted gears and I assisted into bed B LEs. Discussed with pt to stay in recliner until she feels comfortable at home to do bed mobility.    Transfers Overall transfer level: Needs assistance Equipment used: Rolling walker (2 wheels) Transfers:  Sit to/from Stand Sit to Stand: Min assist, From elevated surface           General transfer comment: Cues for hand placement; light min A to stabilize when standing and moving hands to RW; increased time    Ambulation/Gait Ambulation/Gait assistance: Contact guard assist Gait Distance (Feet): 120 Feet Assistive device: Rolling walker (2 wheels) Gait Pattern/deviations: Step-to pattern, Step-through pattern Gait velocity: decreased     General Gait Details:  Pt tolerated ambulation better today. step to pattern, became more of step through pattern, and increased distance.   Stairs             Wheelchair Mobility     Tilt Bed    Modified Rankin (Stroke Patients Only)       Balance Overall balance assessment: Needs assistance Sitting-balance support: Feet supported Sitting balance-Leahy Scale: Good     Standing balance support: Bilateral upper extremity supported Standing balance-Leahy Scale: Fair Standing balance comment: RW and CGA                            Communication Communication Communication: No apparent difficulties  Cognition Arousal: Alert Behavior During Therapy: Anxious (very anxious during this session see notes.)   PT - Cognitive impairments: No apparent impairments                         Following commands: Intact      Cueing    Exercises      General Comments        Pertinent Vitals/Pain Pain Assessment Faces Pain Scale: Hurts a little bit (pt was focused on other things, no real reports of pain, but was better during and after movement) Pain Location: worst pain is with in and out of bed , pt begins to guard in L hip area even with assisted LE into bed. , then panics with fear of what it may feel like Pain Descriptors / Indicators: Sore Pain Intervention(s): Monitored during session, Limited activity within patient's tolerance    Home Living                          Prior Function            PT Goals (current goals can now be found in the care plan section) Acute Rehab PT Goals Patient Stated Goal: to not have pain PT Goal Formulation: With patient Time For Goal Achievement: 03/02/24 Potential to Achieve Goals: Good Progress towards PT goals: Progressing toward goals    Frequency    7X/week      PT Plan      Co-evaluation              AM-PAC PT 6 Clicks Mobility   Outcome Measure  Help needed turning from your back to your  side while in a flat bed without using bedrails?: None Help needed moving from lying on your back to sitting on the side of a flat bed without using bedrails?: A Lot Help needed moving to and from a bed to a chair (including a wheelchair)?: A Little Help needed standing up from a chair using your arms (e.g., wheelchair or bedside chair)?: A Little Help needed to walk in hospital room?: A Little Help needed climbing 3-5 steps with a railing? : A Little 6 Click Score: 18    End of Session Equipment Utilized During Treatment: Gait belt  Activity Tolerance: Other (comment);Patient tolerated treatment well (long session limited at times due to patient crying, anxious , pt mentioned she was having  panic attacks .) Patient left: with bed alarm set;in bed;with call bell/phone within reach Nurse Communication: Mobility status PT Visit Diagnosis: Unsteadiness on feet (R26.81);Other abnormalities of gait and mobility (R26.89);Muscle weakness (generalized) (M62.81);Difficulty in walking, not elsewhere classified (R26.2);Pain Pain - Right/Left: Left Pain - part of body: Hip;Leg     Time: 1400-1510 PT Time Calculation (min) (ACUTE ONLY): 70 min  Charges:    $Gait Training: 8-22 mins $Therapeutic Exercise: 8-22 mins $Therapeutic Activity: 8-22 mins PT General Charges $$ ACUTE PT VISIT: 1 Visit                     [name], PT, MPT Acute Rehabilitation Services Office: [phone] If a weekend: secure chat groups: [facility] PT, [facility] OT, [facility] SLP 02/21/2024    [name] 02/21/2024, 8:06 PM

## 2024-02-21 NOTE — Progress Notes (Addendum)
       Overnight   NAME: [name] MRN: [id number] DOB : Jan 24, 1951    Date of Service   02/21/2024   HPI/Events of Note    Notified by Attending service for concern of troponin value earlier.  Brief history 73 year old female with pmh of CHF, CAD, HLD, HTN, DM.  Admitted through ER for THA by Dr. Edna with consult to hospitalist service for jaw pain earlier.   Follow-up Series troponins ordered by [facility] Physician as noted earlier.  Current results are  Latest Reference Range & Units 02/21/24 17:03 02/21/24 18:32 02/21/24 20:58  Troponin T High Sensitivity 0 - 19 ng/L <15 48 (H) <15  (H): Data is abnormally high  Patient has no obvious or stated distress. Remainder of series will be between 0000 and 0100 hrs.   Interventions/ Plan   Follow-up remaining troponin Current troponin is negative No further reported difficulties.      Lynwood Kipper BSN MSNA MSN ACNPC-AG Acute Care Nurse Practitioner [facility]

## 2024-02-21 NOTE — Progress Notes (Signed)
 PT Cancellation Note  Patient Details Name: [name] MRN: [id number] DOB: [birth year]   Cancelled Treatment:    Reason Eval/Treat Not Completed:  Attempted PT tx session-pt speaking with member of leadership team with lunch tray in front of her. She requested PT check back after she eats her meal. Will check back as schedule allows.    Dannial SQUIBB, PT Acute Rehabilitation  Office: [phone]

## 2024-02-21 NOTE — Progress Notes (Addendum)
 Patient with hx of PTSD, anxiety, Hx NSTEMI,CAD s/p CABG x 3 w/ transient post-op afib, CHF, PVC's, HTN, HLD, type 2 DM  Has been very frustrated with her care and so far how long it has taken to get out of the hospital. She has not been taking her normal BID klonopin  due to not wanting it to interfere with her ability to work with PT. Today after PT, patient reports to be severely anxious about continued hospital stay, feels like she had a panic attack. She was given klonopin  which helped some but did not completely resolve symptoms. After she reports intermittent left sided jaw pain. She states this was the only symptom of her previous heart attack and is worried she could be having another heart issue. RN performed an EKG showing sinus rhythm w/ PVCs and age indeterminate infarct.   On examination, patient is tearful, rubbing her left jaw. She endorses some chest tightness, otherwise no complaints. Lungs clear to auscultation. Tachycardic w/ irregular rhythm.   Troponin ordered and has already been drawn. Discussed case w/ Dr. Josefina and consulted medicine, they will come see.   Army Daring, PA-C

## 2024-02-21 NOTE — Progress Notes (Signed)
 3 Days Post-Op Procedure(s) (LRB): ARTHROPLASTY, HIP, TOTAL,POSTERIOR APPROACH (Left) Subjective:  PT went better yesterday after fluids given. Feeling better today. She was able to eat and drink better yesterday as well. Still frustrated about having been given dilaudid , feels like she lost an entire 2 days in the hospital.  Would like to discharge due to frustration with her care in the hospital, but also very worried about the ability to get into and out of a car at discharge.  No other complaints.   Objective:   VITALS:   Vitals:   02/18/24 1342 02/18/24 2019 02/18/24 2152 02/19/24 0521  BP: (!) 103/50 (!) 142/78 127/71 (!) 136/59  Pulse: 83 80 83 81  Resp: 16  18 18   Temp: 98.1 F (36.7 C)  98.2 F (36.8 C) 98.4 F (36.9 C)  TempSrc: Oral  Oral Oral  SpO2: 97%  100% 97%  Weight:      Height:        laying comfortably in bed in NAD Neurologically intact Sensation intact distally Intact pulses distally Dorsiflexion/Plantar flexion intact Incision: dressing C/D/I Compartment soft Wiggles toes appropriately   Lab Results  Component Value Date   WBC 9.6 02/19/2024   HGB 11.1 (L) 02/19/2024   HCT 33.0 (L) 02/19/2024   MCV 93.0 02/19/2024   PLT 142 (L) 02/19/2024   BMET    Component Value Date/Time   NA 139 02/18/2024 0823   NA 142 12/16/2016 1517   K 4.3 02/18/2024 0823   CL 105 02/18/2024 0823   CO2 24 02/18/2024 0823   GLUCOSE 140 (H) 02/18/2024 0823   BUN 17 02/18/2024 0823   BUN 12 12/16/2016 1517   CREATININE 1.05 (H) 02/18/2024 0823   CREATININE 0.86 05/18/2020 1530   CALCIUM  8.8 (L) 02/18/2024 0823   GFRNONAA 56 (L) 02/18/2024 0823   GFRNONAA 59 (L) 02/17/2020 1647    Yesterday's total administered Morphine  Milligram Equivalents: 20  Xray: stable post-operative imaging  Assessment/Plan: S/p left total hip replacement 11/12  Post op recs: WB: WBAT LLE, No formal hip precautions Abx: ancef  Imaging: PACU pelvis Xray Dressing:  Aquacell, keep intact until follow up DVT prophylaxis: Eliquis 2.5 mg twice daily starting POD1, added on aspirin  81 mg daily per note in chart from cardiology. Plan for Eliquis 2.5 mg BID with ASA 81 mg daily in immediate post-operative period, then transition back to plavix  without aspirin . (I have added this to d/c orders as well) Follow up: 2 weeks after surgery for a wound check with Dr. Edna at [facility].  Address: [address], [place], [place] [address]  Office Phone: [phone]  - had long conversation with patient today about her frustrations about her care in the hospital. She feels like this has been a significantly harder recovery than even her open heart surgery. She has overall been very unhappy with her care. I discussed me elevating her concerns to the nursing director, she has asked me to hold off on this at this time.  - If she is able to pass PT today, then I think she is likely able to go home, but she is very concerned about ability to get into and out of a car and this may limit discharge today.    Contact information:   Weekdays 7am-5pm epic message Dr. Edna, or call office for patient follow up: [phone] After hours and holidays please check [email or web address] for group call information for [facility]

## 2024-02-21 NOTE — Hospital Course (Signed)
 73 y.o. F with sCHF EF 45-50%, CAD s/p CABG 23, HLD, HTN, DM who was admitted for L THA on by Dr. Edna and for whom the hospitalist service was consulted for jaw pain.

## 2024-02-21 NOTE — Progress Notes (Signed)
 Patient reports having a panic attack during physical therapy, reports crying, and feeling very anxious. Klonopin  administered. Patient had reported grinding her teeth prior to therapy related to being agitated, and since her panic attack has had pain in her left jaw area. Patient states that when she had her heart attack previously, that was the main symptom. No adventicious heart sounds, VSS. RN suggested EKG, but patient declined, stated lets see what the Klonopin  does.  After 60 minutes, jaw pain has decreased but not completely resolved, patient agreed to EKG. On call PA/MD notified, new orders received. Will monitor closely.

## 2024-02-21 NOTE — Consult Note (Signed)
 Initial Consultation Note   Patient: [name] FMW:[id number] DOB: 1951/02/12 PCP: Jordan, Betty G, MD DOA: 02/17/2024 DOS: the patient was seen and examined on 02/21/2024 Primary service: Edna Toribio LABOR, MD  Referring physician: Dr. Josefina Reason for consult: Jaw pain concern for anginal equivalent    HPI:  73 y.o. F with sCHF EF 45-50%, CAD s/p CABG 23, HLD, HTN, DM who was admitted for L THA on by Dr. Edna and for whom the hospitalist service was consulted for jaw pain.  Has had no issues with chest discomfort, SOB or jaw pain until today.  Was working with PT and developed typical panic attack symptoms for her (nausea followed by lip numbness, jaw grinding, hyperventilation, and crying).  Got back to bed and was doing breathing exercises to calm down when she noticed jaw discomfort that reminded her of her previous MI.  Also then noticed chest tightness.  ECG obtained and showed PVCs but no ST changes.        Assessment/Plan: Jaw pain History of coronary artery disease s/p CABG in 2023 Symptoms not exertional.  ECG at baseline.  No ST changes.  Initial troponin normal.  Alternative explanation for jaw pain (jaw grinding and panic) available.    - Follow repeat troponin - If two troponins are normal, I feel the chance of a cardiac event are exceedingly low - Continue aspirin , Eliquis, Zetia , Jardiance , metoprolol , Entresto  now and at discharge - Repeat BMP and CBC with PCP in 1 week    Anxiety At baseline, the patient takes clonazepam  most days, review of PDMP refill history implies that she takes on average 2x/day.  In the hospital, she has avoided taking any all week in an effort to improve her rehab ability and go home rather than SNF, which is appropriate.  I would assume however that she is having some rebound anxiety/panic as a result of being off the medication. It's reasonable to be cautious about coadministration of opiates and benzodiazepines,  although she tells me she is using very little opiate.  She and I had a discussion about safe methods to continue this medication during her recovery. - Recommend resuming clonazepam  - This could be at home dose (or home frequency and reduced dose if she is concerned)       Review of Systems: As mentioned in the history of present illness. All other systems reviewed and are negative. Past Medical History:  Diagnosis Date   Adrenal gland cyst    Anxiety    Arthritis    CHF (congestive heart failure) (HCC)    Depression    Diabetes mellitus without complication (HCC)    GERD (gastroesophageal reflux disease)    Heart murmur    History of kidney stones    Hyperlipidemia    Hypertension    Neuromuscular disorder (HCC)    Palpitations    Pneumonia    PONV (postoperative nausea and vomiting)    PTSD (post-traumatic stress disorder)    Ringing of ears, left    Past Surgical History:  Procedure Laterality Date   ABDOMINAL HYSTERECTOMY     partial   ACHILLES TENDON SURGERY Left    APPENDECTOMY     BACK SURGERY     BLADDER SUSPENSION     BREAST SURGERY Right 2003   milk duct removed   CARDIAC CATHETERIZATION     CHOLECYSTECTOMY     CORONARY ARTERY BYPASS GRAFT N/A 11/14/2021   Procedure: CORONARY ARTERY BYPASS GRAFTING (CABG) TIMES THREE USING LIMA  AND CRYO VEIN.;  Surgeon: Obadiah Coy, MD;  Location: [facility] OR;  Service: Open Heart Surgery;  Laterality: N/A;   FRACTURE SURGERY     collar bone   LEFT HEART CATH AND CORONARY ANGIOGRAPHY N/A 03/08/2021   Procedure: LEFT HEART CATH AND CORONARY ANGIOGRAPHY;  Surgeon: Jordan, Peter M, MD;  Location: [facility] INVASIVE CV LAB;  Service: Cardiovascular;  Laterality: N/A;   NOSE SURGERY     x 2    OTHER SURGICAL HISTORY     Breast Duct removed   PARTIAL HYSTERECTOMY     RECTOCELE REPAIR     TEE WITHOUT CARDIOVERSION N/A 11/14/2021   Procedure: TRANSESOPHAGEAL ECHOCARDIOGRAM (TEE);  Surgeon: Obadiah Coy, MD;  Location: [facility] OR;   Service: Open Heart Surgery;  Laterality: N/A;   TONSILLECTOMY     TOTAL HIP ARTHROPLASTY Left 02/17/2024   Procedure: ARTHROPLASTY, HIP, TOTAL,POSTERIOR APPROACH;  Surgeon: Edna Toribio LABOR, MD;  Location: [facility] ORS;  Service: Orthopedics;  Laterality: Left;   VASCULAR SURGERY Right    right leg   VEIN SURGERY Right    leg   Social History:  reports that she has never smoked. She has never used smokeless tobacco. She reports that she does not drink alcohol and does not use drugs.  Allergies  Allergen Reactions   Hydromorphone  Nausea And Vomiting and Other (See Comments)    Many years, high-dose made the patient feel spaced out   Morphine  And Codeine Itching    With high doses   Statins     Hx intolerance to multiple statins   Penicillins Hives and Rash    Family History  Problem Relation Age of Onset   Diabetes Mother    Alzheimer's disease Mother    Heart disease Mother    Lung cancer Father    Esophageal cancer Brother    Colon cancer Neg Hx     Prior to Admission medications   Medication Sig Start Date End Date Taking? Authorizing Provider  acetaminophen  (TYLENOL ) 500 MG tablet Take 2 tablets (1,000 mg total) by mouth every 8 (eight) hours as needed. 02/17/24 03/18/24 Yes Renae Bernarda HERO, PA-C  apixaban (ELIQUIS) 2.5 MG TABS tablet Take 1 tablet (2.5 mg total) by mouth 2 (two) times daily. 02/17/24  Yes Renae Bernarda HERO, PA-C  aspirin  EC 81 MG tablet Take 1 tablet (81 mg total) by mouth daily while taking Eliquis. Stop taking Aspirin  once you have finished postoperative Eliquis. 02/21/24 03/22/24 Yes Brown, Blaine K, PA-C  celecoxib (CELEBREX) 100 MG capsule Take 1 capsule (100 mg total) by mouth 2 (two) times daily for 14 days. 02/17/24 03/02/24 Yes Renae Bernarda HERO, PA-C  clonazePAM  (KLONOPIN ) 1 MG tablet Take 1 tablet (1 mg total) by mouth 2 (two) times daily as needed. for anxiety 11/13/23  Yes Jordan, Betty G, MD  clopidogrel  (PLAVIX ) 75 MG tablet Take 1  tablet (75 mg total) by mouth daily. 09/02/23  Yes Rolan Ezra RAMAN, MD  docusate sodium  (COLACE) 100 MG capsule Take 200 mg by mouth 3 (three) times daily as needed for mild constipation.   Yes [provider]  Dulaglutide  (TRULICITY ) 0.75 MG/0.5ML SOAJ Inject 0.75 mg into the skin once a week. 02/09/24  Yes Jordan, Betty G, MD  Evolocumab  (REPATHA  SURECLICK) 140 MG/ML SOAJ Inject 140 mg into the skin every 14 (fourteen) days. 02/15/24  Yes Rolan Ezra RAMAN, MD  ezetimibe  (ZETIA ) 10 MG tablet Take 1 tablet (10 mg total) by mouth daily. 10/03/22 06/03/24 Yes Rolan Ezra RAMAN,  MD  fluticasone  (FLONASE ) 50 MCG/ACT nasal spray Place 2 sprays into both nostrils daily. Patient taking differently: Place 2 sprays into both nostrils daily as needed for allergies. 03/13/23  Yes Jordan, Betty G, MD  furosemide  (LASIX ) 20 MG tablet TAKE 1 TABLET BY MOUTH ONCE DAILY AS NEEDED 01/20/24  Yes McLean, Dalton S, MD  gabapentin  (NEURONTIN ) 600 MG tablet TAKE 1 TABLET BY MOUTH EVERY MORNING AND AT NOON AND EVERY EVENING AND AT BEDTIME 06/18/23  Yes Debby Fidela CROME, NP  guaiFENesin  (MUCINEX ) 600 MG 12 hr tablet Take 600 mg by mouth daily.   Yes [provider]  HYDROcodone -acetaminophen  (NORCO) 10-325 MG tablet Take 1 tablet by mouth every 6 (six) hours as needed. 02/12/24  Yes Debby Fidela CROME, NP  JARDIANCE  10 MG TABS tablet Take 1 tablet by mouth once daily 01/08/24  Yes McLean, Dalton S, MD  MAGNESIUM  GLYCINATE PO Take 1 tablet by mouth daily.   Yes [provider]  Melatonin 10 MG CHEW Chew 20 mg by mouth at bedtime.   Yes [provider]  methocarbamol  (ROBAXIN ) 500 MG tablet Take 1 tablet (500 mg total) by mouth every 8 (eight) hours as needed for up to 10 days for muscle spasms. 02/17/24 02/27/24 Yes Renae Bernarda HERO, PA-C  metoprolol  succinate (TOPROL -XL) 100 MG 24 hr tablet Take 1 tablet (100 mg total) by mouth 2 (two) times daily. 01/13/24  Yes Rolan Ezra RAMAN, MD  ondansetron   (ZOFRAN ) 4 MG tablet Take 1 tablet (4 mg total) by mouth every 8 (eight) hours as needed for up to 14 days for nausea or vomiting. 02/17/24 03/02/24 Yes Renae Bernarda HERO, PA-C  ondansetron  (ZOFRAN -ODT) 4 MG disintegrating tablet DISSOLVE 1 TABLET IN MOUTH ONCE DAILY AS NEEDED FOR NAUSEA FOR VOMITING 01/25/24  Yes Jordan, Betty G, MD  pantoprazole  (PROTONIX ) 40 MG tablet Take 1 tablet by mouth once daily 11/30/23  Yes Jordan, Betty G, MD  Polyethyl Glycol-Propyl Glycol (SYSTANE) 0.4-0.3 % SOLN Place 1 drop into both eyes daily as needed (Dry eye).   Yes [provider]  polyethylene glycol (MIRALAX ) 17 g packet Take 17 g by mouth daily. 02/17/24  Yes Renae Bernarda HERO, PA-C  potassium chloride  SA (KLOR-CON  M) 20 MEQ tablet Take 1 tablet by mouth once daily 01/29/24  Yes Jordan, Betty G, MD  QUEtiapine  (SEROQUEL ) 100 MG tablet TAKE 1/2 (ONE-HALF) TABLET BY MOUTH AT BEDTIME 01/11/24  Yes Jordan, Betty G, MD  rOPINIRole  (REQUIP ) 0.5 MG tablet TAKE 1 TABLET BY MOUTH AT BEDTIME 02/15/24  Yes Jordan, Betty G, MD  sacubitril -valsartan  (ENTRESTO ) 24-26 MG Take 1 tablet by mouth 2 (two) times daily. 06/04/23  Yes Rolan Ezra RAMAN, MD  VENTOLIN  HFA 108 (90 Base) MCG/ACT inhaler INHALE 2 PUFFS BY MOUTH EVERY 6 HOURS AS NEEDED FOR WHEEZING FOR SHORTNESS OF BREATH 01/18/24  Yes Jordan, Betty G, MD  Alcohol Swabs (B-D SINGLE USE SWABS REGULAR) PADS Use to test blood sugar 3 times daily 08/11/17   Jordan, Betty G, MD  Blood Glucose Monitoring Suppl (ONETOUCH VERIO FLEX SYSTEM) w/Device KIT Use to test blood sugars 1-2 times daily. 09/04/21   Jordan, Betty G, MD  Lancets [facility] ULTRASOFT) lancets Use to test blood sugar 1-2 times daily. 09/04/21   Jordan, Betty G, MD    Physical Exam: Vitals:   02/20/24 1435 02/20/24 2202 02/21/24 0555 02/21/24 1351  BP: 100/68 130/63 (!) 141/62 118/61  Pulse: 82 61 65 100  Resp: 17 18 18 16   Temp:  98.9 F (37.2 C) 100 F (37.8 C) 97.8 F (36.6 C) 98.1 F (36.7 C)   TempSrc: Oral Oral Oral Oral  SpO2: 99% 99% 98% 99%  Weight:      Height:       General appearance:   alert but paroxysms of tearfulness.  Responds appropriately to questions.  Eye contact, dress and hygiene appropriate. HEENT:  Anicteric, conjunctivae and sclerae normal without injection or icterus, lids and lashes normal.  Visual tracking smooth.  OP moist without lesions, dentition normal, lips normal, normal auditory acuity.  Mild soreness to palpation of the left angle of jaw, no overlying skin changes. Cor:  Rate controlled, irregular rhythm, SEM noted, radiating to carotids, JVP normal, no LE edema   Resp:  Normal respiratory rate and rhythm.  CTAB without rales or wheezes. Abd:  No TTP or rebound all quadrants.   MSK: Symmetrical without gross deformities of the hands, large joints, or legs. Skin:  cap refill normal, Skin intact without significant rashes or lesions. Neuro:  Speech is fluent.  Naming is grossly intact, patient's recall, both recent and remote, seem within normal limits.  Muscle tone normal, face symmetric  Psych:  Attention span and concentration are within normal limits.  Affect anxious.  appropriate thought content and normal rate of speech, thought process linear          Data Reviewed:   ECG personally reviewed Troponin reviewed BMP and CBC from [facility] this week unremarkable    Family Communication: None Primary team communication: Disussed with Blain Brown Thank you very much for involving us  in the care of your patient.  Author: Lonni SHAUNNA Dalton, MD 02/21/2024 6:00 PM  For on call review [email or web address].

## 2024-02-22 DIAGNOSIS — E1140 Type 2 diabetes mellitus with diabetic neuropathy, unspecified: Secondary | ICD-10-CM | POA: Diagnosis not present

## 2024-02-22 DIAGNOSIS — I2510 Atherosclerotic heart disease of native coronary artery without angina pectoris: Secondary | ICD-10-CM | POA: Diagnosis not present

## 2024-02-22 DIAGNOSIS — M1612 Unilateral primary osteoarthritis, left hip: Secondary | ICD-10-CM | POA: Diagnosis not present

## 2024-02-22 DIAGNOSIS — Z79899 Other long term (current) drug therapy: Secondary | ICD-10-CM | POA: Diagnosis not present

## 2024-02-22 DIAGNOSIS — I5022 Chronic systolic (congestive) heart failure: Secondary | ICD-10-CM | POA: Diagnosis not present

## 2024-02-22 DIAGNOSIS — I110 Hypertensive heart disease with heart failure: Secondary | ICD-10-CM | POA: Diagnosis not present

## 2024-02-22 DIAGNOSIS — M25552 Pain in left hip: Secondary | ICD-10-CM | POA: Diagnosis not present

## 2024-02-22 LAB — GLUCOSE, CAPILLARY
Glucose-Capillary: 121 mg/dL — ABNORMAL HIGH (ref 70–99)
Glucose-Capillary: 103 mg/dL — ABNORMAL HIGH (ref 70–99)
Glucose-Capillary: 104 mg/dL — ABNORMAL HIGH (ref 70–99)

## 2024-02-22 NOTE — Progress Notes (Signed)
 Physical Therapy Treatment Patient Details Name: [name] MRN: [id number] DOB: 1950-04-10 Today's Date: 02/22/2024   History of Present Illness      PT Comments  Tolerated session well this morning. Pt very receptive and more confident and less anxious about going home. Reviewed going up/ down curb, practiced twice and handout given. Ambulated with much smoother gait pattern in hallway today, and review exercise program ( eliminating the supine hop abd/ add and SLR) and adding the standing small hip flexion with LLE , and hip ABD with LLE with RW to steady. AS well as long arc quad.  Reviewed home program and plan for car transfer when daughter arrives this afternoon. She is ready to DC from PT standpoint.    If plan is discharge home, recommend the following: A little help with walking and/or transfers;A little help with bathing/dressing/bathroom;Assistance with cooking/housework;Assist for transportation   Can travel by private vehicle        Equipment Recommendations  Rolling walker (2 wheels)    Recommendations for Other Services       Precautions / Restrictions Restrictions Weight Bearing Restrictions Per Provider Order: No LLE Weight Bearing Per Provider Order: Weight bearing as tolerated     Mobility  Bed Mobility                    Transfers Overall transfer level: Needs assistance Equipment used: Rolling walker (2 wheels) Transfers: Sit to/from Stand Sit to Stand: Supervision           General transfer comment: good safety with hand placment this morning    Ambulation/Gait Ambulation/Gait assistance: Contact guard assist Gait Distance (Feet): 120 Feet Assistive device: Rolling walker (2 wheels) Gait Pattern/deviations: Step-through pattern       General Gait Details: Pt ambulation was good today, reported some soreness in teh end on posteriro hip area but very tolerable. smoother gait pattern   Stairs Stairs: Yes Stairs assistance:  Contact guard assist Stair Management: No rails Number of Stairs: 1 General stair comments: practiced curb 2 times up and down with front apprach and RW , handout given.   Wheelchair Mobility     Tilt Bed    Modified Rankin (Stroke Patients Only)       Balance Overall balance assessment: Needs assistance Sitting-balance support: Feet supported Sitting balance-Leahy Scale: Good     Standing balance support: Bilateral upper extremity supported Standing balance-Leahy Scale: Fair Standing balance comment: RW and CGA                            Communication Communication Communication: No apparent difficulties  Cognition Arousal: Alert Behavior During Therapy:  (anxiousness much improved today)   PT - Cognitive impairments: No apparent impairments                         Following commands: Intact      Cueing    Exercises Total Joint Exercises Hip Abduction/Adduction Limitations: standing at RW , Marching in Standing: AROM, Left, Standing    General Comments        Pertinent Vitals/Pain Pain Assessment Pain Assessment: 0-10 Pain Score: 2  Pain Location: L posterior hip area after walking, sore like I have used it  Pain Descriptors / Indicators: Sore    Home Living  Prior Function            PT Goals (current goals can now be found in the care plan section) Acute Rehab PT Goals Patient Stated Goal: to not have pain PT Goal Formulation: With patient Time For Goal Achievement: 03/02/24 Potential to Achieve Goals: Good Progress towards PT goals: Progressing toward goals    Frequency    7X/week      PT Plan      Co-evaluation              AM-PAC PT 6 Clicks Mobility   Outcome Measure  Help needed turning from your back to your side while in a flat bed without using bedrails?: None Help needed moving from lying on your back to sitting on the side of a flat bed without using  bedrails?: None Help needed moving to and from a bed to a chair (including a wheelchair)?: None Help needed standing up from a chair using your arms (e.g., wheelchair or bedside chair)?: A Little Help needed to walk in hospital room?: A Little Help needed climbing 3-5 steps with a railing? : A Little 6 Click Score: 21    End of Session Equipment Utilized During Treatment: Gait belt Activity Tolerance: Other (comment);Patient tolerated treatment well Patient left: with call bell/phone within reach;in chair Nurse Communication: Mobility status PT Visit Diagnosis: Unsteadiness on feet (R26.81);Other abnormalities of gait and mobility (R26.89);Muscle weakness (generalized) (M62.81);Difficulty in walking, not elsewhere classified (R26.2);Pain Pain - Right/Left: Left Pain - part of body: Hip;Leg     Time: 1030-1110 PT Time Calculation (min) (ACUTE ONLY): 40 min  Charges:    $Gait Training: 8-22 mins $Therapeutic Exercise: 8-22 mins PT General Charges $$ ACUTE PT VISIT: 1 Visit                     Wilna Pennie, PT, MPT Acute Rehabilitation Services Office: [phone] If a weekend: secure chat groups: [facility] PT, [facility] OT, [facility] SLP 02/22/2024    Obe Ahlers 02/22/2024, 3:34 PM

## 2024-02-22 NOTE — Progress Notes (Signed)
 Unable to obtain last Troponin. Lab went to draw patient labs. Patient was very startled not knowing where she was. Was trying to redirect patient. Patient was still unaware of what was going on. Noted anxiety during the shift till patient was able to fall asleep.

## 2024-02-22 NOTE — Progress Notes (Signed)
  Progress Note   Patient: [name] FMW:[id number] DOB: 04/17/1950 DOA: 02/17/2024     0 DOS: the patient was seen and examined on 02/22/2024        Brief hospital course: 73 y.o. F with sCHF EF 45-50%, CAD s/p CABG 23, HLD, HTN, DM who was admitted for L THA on by Dr. Edna and for whom the hospitalist service was consulted for jaw pain.     Assessment and Plan: Jaw pain Coronary disease Symptoms provoked by panic attack, subsided with rest and did not recur overnight.    ECG showed PVCs, no ST changes.  Troponin minimal bump to 48 then undetectable.    Given resolution of symptoms, reassuring HsTrop, I recommend no further work up in the hospital. Recommend close follow up with primary Cardiologist for eval of stable angina. - Continue aspirin , Eliquis (VTE ppx) for now - Hold home Plavix  - Follow up with Cardiology           Physical Exam: BP 136/70 (BP Location: Right Arm)   Pulse (!) 46   Temp 97.8 F (36.6 C) (Oral)   Resp 16   Ht 5' 8 (1.727 m)   Wt 79.4 kg   SpO2 98%   BMI 26.61 kg/m   Patient was seen and examined.  Symptoms resolved.               Author: Lonni SHAUNNA Dalton, MD 02/22/2024 9:04 AM  For on call review [email or web address].

## 2024-02-22 NOTE — Progress Notes (Signed)
 5 Days Post-Op Procedure(s) (LRB): ARTHROPLASTY, HIP, TOTAL,POSTERIOR APPROACH (Left) Subjective:  Patient reports pain as mild-to-moderate.  Was sleeping soundly prior to arrival this AM.  No complaints of chest pain, chest tightness, shortness of breath, jaw pain, or discomfort other than hip.  Back on benzo.  Less anxious than yesterday.    Lots of anxiety over weekend, with some concerning possible cardiac symptoms that prompted involvement from hospitalist.  See separate note.  Troponins and ECG generally reassuring.  Again symptoms improved since yesterday evening, no major overnight events.  Discussed plan for mobilizing with PT today, with focus of 1-stair technique for arriving home.  Patient expressed understanding.  Anticipate if does well can dispo today, patient in agreement and appears encouraged by this.  Requesting to use restroom.  No further concerns this morning.  Objective:   VITALS:   Vitals:   02/18/24 1342 02/18/24 2019 02/18/24 2152 02/19/24 0521  BP: (!) 103/50 (!) 142/78 127/71 (!) 136/59  Pulse: 83 80 83 81  Resp: 16  18 18   Temp: 98.1 F (36.7 C)  98.2 F (36.8 C) 98.4 F (36.9 C)  TempSrc: Oral  Oral Oral  SpO2: 97%  100% 97%  Weight:      Height:        Laying comfortably in bed in NAD Neurologically intact Sensation intact distally Intact pulses distally Dorsiflexion/Plantar flexion intact Incision: dressing C/D/I Compartment soft Wiggles toes appropriately   Lab Results  Component Value Date   WBC 9.6 02/19/2024   HGB 11.1 (L) 02/19/2024   HCT 33.0 (L) 02/19/2024   MCV 93.0 02/19/2024   PLT 142 (L) 02/19/2024   BMET    Component Value Date/Time   NA 139 02/18/2024 0823   NA 142 12/16/2016 1517   K 4.3 02/18/2024 0823   CL 105 02/18/2024 0823   CO2 24 02/18/2024 0823   GLUCOSE 140 (H) 02/18/2024 0823   BUN 17 02/18/2024 0823   BUN 12 12/16/2016 1517   CREATININE 1.05 (H) 02/18/2024 0823   CREATININE 0.86 05/18/2020 1530    CALCIUM  8.8 (L) 02/18/2024 0823   GFRNONAA 56 (L) 02/18/2024 0823   GFRNONAA 59 (L) 02/17/2020 1647    Yesterday's total administered Morphine  Milligram Equivalents: 20  Xray: stable post-operative imaging  Assessment/Plan: S/p left total hip replacement 11/12  Post op recs: WB: WBAT LLE, No formal hip precautions Abx: ancef  Imaging: PACU pelvis Xray Dressing: Aquacell, keep intact until follow up DVT prophylaxis: Eliquis 2.5 mg twice daily starting POD1, added on aspirin  81 mg daily per note in chart from cardiology. Plan for Eliquis 2.5 mg BID with ASA 81 mg daily in immediate post-operative period, then transition back to plavix  without aspirin . (I have added this to d/c orders as well) Follow up: 2 weeks after surgery for a wound check with Dr. Edna at [facility].  Address: [address], [place], [place] [address]  Office Phone: [phone]  Narrative:  11/17: discussed troponins with hospitalist.  Second troponin mildly elevated, but primary and tertiary troponins and ECG normal.  Symptom improvement since last night, likely anxiety driven.  However hospitalist recommended follow up with cardiology Dr. Rolan within 1 week and has added on Aspirin  81 mg daily to her 2.5 mg Eliquis BID, which was added to dispo ppwk.  Hopeful for progression with PT today, patient looking to go home pending PT which I agree with.  F/u in office next week as planned.  11/16: had long conversation with patient today about her frustrations about her care in the hospital. She feels like this has been a significantly harder recovery than even her open heart surgery. She has overall been very unhappy with her care. I discussed me elevating her concerns to the nursing director, she has asked me to hold off on this at this time.  - If she is able to pass PT today, then I think she is likely able to go home, but she is very concerned about ability to get into and out of a car  and this may limit discharge today.    Contact information:   Weekdays 7am-5pm epic message Dr. Edna, or call office for patient follow up: [phone] After hours and holidays please check [email or web address] for group call information for [facility]

## 2024-02-23 ENCOUNTER — Encounter (HOSPITAL_COMMUNITY): Payer: Self-pay | Admitting: Cardiology

## 2024-02-23 ENCOUNTER — Other Ambulatory Visit (HOSPITAL_COMMUNITY): Payer: Self-pay | Admitting: Cardiology

## 2024-02-25 ENCOUNTER — Encounter: Admitting: Registered Nurse

## 2024-02-27 ENCOUNTER — Other Ambulatory Visit: Payer: Self-pay | Admitting: Family Medicine

## 2024-02-27 DIAGNOSIS — E876 Hypokalemia: Secondary | ICD-10-CM

## 2024-03-01 DIAGNOSIS — Z4789 Encounter for other orthopedic aftercare: Secondary | ICD-10-CM | POA: Diagnosis not present

## 2024-03-01 DIAGNOSIS — Z96642 Presence of left artificial hip joint: Secondary | ICD-10-CM | POA: Diagnosis not present

## 2024-03-08 ENCOUNTER — Telehealth (HOSPITAL_COMMUNITY): Payer: Self-pay

## 2024-03-08 NOTE — Progress Notes (Signed)
 ADVANCED HF CLINIC NOTE  Primary Care: Jordan, Betty G, MD HF Cardiologist: Dr. Rolan  HPI: [name] is a 73 y.o. female w/ chronic systolic heart failure, CAD, PVCs, type 2DM, HTN and HLD.    Referred for evaluation of palpitations 6/22. Cardiac monitor showed 24% PVC burden. Echo EF 55-60%, RV normal.    11/22 she was ordered to get stress test prior to surgical clearance for back surgery. This showed medium to large defects in the apical anterior/anterior septal distribution as well as basal inferior lateral distribution which was suggestive of scar and mild peri-infarct ischemia. She was subsequently referred for [facility] 12/22 which showed severe obstructive CAD with subtotal 99% mid-LAD and complex LCx disease with severe stenoses involving the bifurication of 4 branches with CT surgery consult recommended as LCx disease was not amenable to PCI. Repeat echo showed her EF was mildly reduced at 45-50% with mild LVH, normal RV function and trivial MR. She did meet with Dr. Shyrl on 03/22/2021 and by review of notes was very hesitant about proceeding with surgical intervention and was informed to make them aware with her ultimate decision about proceeding with CABG.    She went for a 2nd opinion and saw Dr. Fleeta Ochoa 4/23 who also recommended CABG, but pt remained hesitant.    Presented to ED 08/22/21 w/ CC, nausea/vomiting and diarrhea. Notes outlined that she had CP and dyspnea but pt denies this. She was found to have an AKI and diagnosed w/ colitis. Also treated for CAP. HsTn 86>>80. Level not consistent with ACS. Felt most likely demand ischemia in the setting of dehydration (vomiting/diarrhea), AKI, acute infection. Echo repeated EF 40-45% w septal apical and inferior apical hypokinesis.    She finally had CABG in 8/23 with LIMA-LAD, SVG-OM1, SVG-OM2.  She had transient post-op atrial fibrillation.   Post-op echo in 11/23 showed EF 45-50% with normal RV.  She was started on  spironolactone  but she could not tolerate this due to diarrhea that started on spironolactone  and resolved once she had stopped it.   She had urgent work-in from [facility] 03/17/22 with HR in 40's. She felt palpitations, in our office, HR 89 on ECG with 2 PVCs. Toprol  increased with frequent PVCs (and not bradycardia). Zio 7 day placed to quantify and showed PVCs down to 13.5%.   Repeat Zio monitor in 7/24 showed 18.2% PVCs.  We referred her to EP for evaluation.  Mexiletine was offered but she preferred not to start it.   Echo in 2/25 showed EF 45-50%, RV mildly decreased systolic function, IVC normal. Technically difficult study.   Today she returns for HF follow up with her son. She is 3 weeks post total hip replacement, walking with a walker. No undue dyspnea, although physically limited by recent surgery. Overall feeling fine. Denies palpitations, abnormal bleeding, CP, dizziness, edema, or PND/Orthopnea. Appetite ok. Weight at home 170 pounds. Taking all medications. She is on low dose Eliquis  1 month post op. We have added ASA. When she stops AC, she will stop ASA and will go back on Plavix .  ECG (personally reviewed): NSR, frequent PACs  Labs (2/24): TSH low but free T4 normal Labs (6/24): LDL 72 Labs (1/25): K 3.4, creatinine 0.99, TSH normal, free T4 normal Labs (3/25): K 3.7, creatinine 0.9, BNP 292   PMH: 1.  Chronic systolic CHF: Ischemic cardiomyopathy.  - Echo (12/22): EF 45-50%, RV normal - Echo (5/23): EF 40-45%, RV normal  - Echo (11/23): EF 45-50%,  normal RV - Echo (2/25): EF 45-50%, RV mildly decreased systolic function, IVC normal. Technically difficult study.  2. CAD: LHC (10/22) with subtotal mid LAD stenosis, complete LCx disease with severe stenoses involving bifurcation of 4 branches of the LCx.  - CABG (8/23) with LIMA-LAD, SVG-OM1, SVG-OM2 3. Atrial fibrillation: Transient post-CABG.   4. Hyperlipidemia: Unable to take statins.  5. HTN 6. Type 2 diabetes 7.  PVCs: Zio monitor (8/23) with 23% PVCs - Zio 7 day (12/23): 13.5 % PVCs - Zio 7 day (7/24): 18.2% PVCs 8. Generalized anxiety  Review of Systems: All systems reviewed and negative except as per HPI.   Current Outpatient Medications  Medication Sig Dispense Refill   Alcohol  Swabs (B-D SINGLE USE SWABS REGULAR) PADS Use to test blood sugar 3 times daily 100 each 3   apixaban  (ELIQUIS ) 2.5 MG TABS tablet Take 1 tablet (2.5 mg total) by mouth 2 (two) times daily. 60 tablet 0   aspirin  EC 81 MG tablet Take 1 tablet (81 mg total) by mouth daily while taking Eliquis . Stop taking Aspirin  once you have finished postoperative Eliquis . 30 tablet 0   Blood Glucose Monitoring Suppl (ONETOUCH VERIO FLEX SYSTEM) w/Device KIT Use to test blood sugars 1-2 times daily. 1 kit 0   clonazePAM  (KLONOPIN ) 1 MG tablet Take 1 tablet (1 mg total) by mouth 2 (two) times daily as needed. for anxiety 60 tablet 3   docusate sodium  (COLACE) 100 MG capsule Take 200 mg by mouth 3 (three) times daily as needed for mild constipation.     Dulaglutide  (TRULICITY ) 0.75 MG/0.5ML SOAJ Inject 0.75 mg into the skin once a week. 6 mL 3   Evolocumab  (REPATHA  SURECLICK) 140 MG/ML SOAJ Inject 140 mg into the skin every 14 (fourteen) days. 6 mL 3   ezetimibe  (ZETIA ) 10 MG tablet Take 1 tablet (10 mg total) by mouth daily. 90 tablet 3   fluticasone  (FLONASE ) 50 MCG/ACT nasal spray Place 2 sprays into both nostrils daily. (Patient taking differently: Place 2 sprays into both nostrils daily as needed for allergies.) 16 g 11   furosemide  (LASIX ) 20 MG tablet TAKE 1 TABLET BY MOUTH ONCE DAILY AS NEEDED 90 tablet 3   gabapentin  (NEURONTIN ) 600 MG tablet TAKE 1 TABLET BY MOUTH EVERY MORNING AND AT NOON AND EVERY EVENING AND AT BEDTIME 120 tablet 3   guaiFENesin  (MUCINEX ) 600 MG 12 hr tablet Take 600 mg by mouth daily.     HYDROcodone -acetaminophen  (NORCO) 10-325 MG tablet Take 1 tablet by mouth every 6 (six) hours as needed. 120 tablet 0    JARDIANCE  10 MG TABS tablet Take 1 tablet by mouth once daily 90 tablet 0   Lancets (ONETOUCH ULTRASOFT) lancets Use to test blood sugar 1-2 times daily. 100 each 12   MAGNESIUM  GLYCINATE PO Take 1 tablet by mouth daily.     Melatonin 10 MG CHEW Chew 20 mg by mouth at bedtime.     metoprolol  succinate (TOPROL -XL) 100 MG 24 hr tablet Take 1 tablet (100 mg total) by mouth 2 (two) times daily. 90 tablet 3   pantoprazole  (PROTONIX ) 40 MG tablet Take 1 tablet by mouth once daily 90 tablet 3   Polyethyl Glycol-Propyl Glycol (SYSTANE) 0.4-0.3 % SOLN Place 1 drop into both eyes daily as needed (Dry eye).     potassium chloride  SA (KLOR-CON  M) 20 MEQ tablet Take 1 tablet by mouth once daily 30 tablet 0   QUEtiapine  (SEROQUEL ) 100 MG tablet TAKE 1/2 (ONE-HALF) TABLET BY  MOUTH AT BEDTIME 45 tablet 3   rOPINIRole  (REQUIP ) 0.5 MG tablet TAKE 1 TABLET BY MOUTH AT BEDTIME 90 tablet 1   sacubitril -valsartan  (ENTRESTO ) 24-26 MG Take 1 tablet by mouth 2 (two) times daily. 60 tablet 11   VENTOLIN  HFA 108 (90 Base) MCG/ACT inhaler INHALE 2 PUFFS BY MOUTH EVERY 6 HOURS AS NEEDED FOR WHEEZING FOR SHORTNESS OF BREATH 18 g 0   acetaminophen  (TYLENOL ) 500 MG tablet Take 2 tablets (1,000 mg total) by mouth every 8 (eight) hours as needed. (Patient not taking: Reported on 03/09/2024)     [Paused] clopidogrel  (PLAVIX ) 75 MG tablet Take 1 tablet (75 mg total) by mouth daily. (Patient not taking: Reported on 03/09/2024) 90 tablet 3   polyethylene glycol (MIRALAX ) 17 g packet Take 17 g by mouth daily. (Patient not taking: Reported on 03/09/2024) 14 each 0   No current facility-administered medications for this encounter.   Allergies  Allergen Reactions   Hydromorphone  Nausea And Vomiting and Other (See Comments)    Many years, high-dose made the patient feel spaced out   Morphine  And Codeine Itching    With high doses   Statins     Hx intolerance to multiple statins   Penicillins Hives and Rash   Social History    Socioeconomic History   Marital status: Widowed    Spouse name: Not on file   Number of children: 1   Years of education: Not on file   Highest education level: Some college, no degree  Occupational History   Occupation: retired    Comment: work part time  Tobacco Use   Smoking status: Never   Smokeless tobacco: Never   Tobacco comments:    Never smoke 11/01/21  Vaping Use   Vaping status: Never Used  Substance and Sexual Activity   Alcohol  use: Never   Drug use: No   Sexual activity: Not on file  Other Topics Concern   Not on file  Social History Narrative   Not on file   Social Drivers of Health   Financial Resource Strain: Low Risk  (01/15/2024)   Overall Financial Resource Strain (CARDIA)    Difficulty of Paying Living Expenses: Not hard at all  Food Insecurity: No Food Insecurity (02/17/2024)   Hunger Vital Sign    Worried About Running Out of Food in the Last Year: Never true    Ran Out of Food in the Last Year: Never true  Transportation Needs: No Transportation Needs (02/17/2024)   PRAPARE - Administrator, Civil Service (Medical): No    Lack of Transportation (Non-Medical): No  Physical Activity: Inactive (01/15/2024)   Exercise Vital Sign    Days of Exercise per Week: 0 days    Minutes of Exercise per Session: 0 min  Stress: No Stress Concern Present (01/15/2024)   [employer] of Occupational Health - Occupational Stress Questionnaire    Feeling of Stress: Not at all  Social Connections: Moderately Integrated (02/17/2024)   Social Connection and Isolation Panel    Frequency of Communication with Friends and Family: More than three times a week    Frequency of Social Gatherings with Friends and Family: More than three times a week    Attends Religious Services: More than 4 times per year    Active Member of [employer] or Organizations: Yes    Attends Banker Meetings: More than 4 times per year    Marital Status: Widowed   Intimate Partner Violence: Not At Risk (  02/17/2024)   Humiliation, Afraid, Rape, and Kick questionnaire    Fear of Current or Ex-Partner: No    Emotionally Abused: No    Physically Abused: No    Sexually Abused: No   Family History  Problem Relation Age of Onset   Diabetes Mother    Alzheimer's disease Mother    Heart disease Mother    Lung cancer Father    Esophageal cancer Brother    Colon cancer Neg Hx    BP 124/78   Pulse 74   Ht 5' 8 (1.727 m)   Wt 81.5 kg (179 lb 9.6 oz)   SpO2 98%   BMI 27.31 kg/m   PHYSICAL EXAM: General:  NAD. No resp difficulty, walked into clinic with rolling walker HEENT: Normal Neck: Supple. No JVD. Cor: Regular rate & rhythm. No rubs, gallops or murmurs. Lungs: Clear Abdomen: Soft, nontender, nondistended.  Extremities: No cyanosis, clubbing, rash, edema Neuro: Alert & oriented x 3, moves all 4 extremities w/o difficulty. Affect pleasant.  ASSESSMENT & PLAN: 1.  Chronic Systolic Heart Failure: Ischemic cardiomyopathy.  Echo 12/22 EF 45-50% => LHC with 2 vessel CAD w/ high grade mLAD disease.  Echo 5/23 with EF 40-45%.  She had CABG x 3 in 8/23.  Post-op echo in 11/23 with EF 45-50%, normal RV.  Echo in 2/25 again showed EF 45-50%, RV mildly decreased systolic function, IVC normal. She is not volume overloaded on exam, NYHA class I symptoms.   - Continue Toprol  XL100 mg bid, this may also help with PVCs.   - Continue Jardiance  10 mg daily.  - She did not tolerate spironolactone .  - Continue Entresto  24/26 bid, BMET/BNP today.   - Continue Lasix  PRN.  - EF is out of ICD range.  - Repeat echo at follow up. 2. CAD: LHC (12/22) with 2V CAD w/ high grade mLAD disease=> pt declined CABG initially but had CABG with LIMA-LAD, SVG-OM1 and SVG-OM2 in 8/23.  No chest pain.  - Continue Plavix  long-term.  - Continue ? blocker.  - Continue Zetia  and Repatha . 3. Frequent PVCs: She has a long history of PVCs that pre-dated her CABG.  23% burden on Zio  8/23.  At last visit, only a couple of PVCs on ECG (no longer bigeminal). PVCs may arise from scar in LV from CAD.  Concerned that very frequent PVCs could worsen her cardiomyopathy. Repeat Zio 7 day (12/23) showed PVCs frequent but improved from previous, down to 13.5%.  However, 7/24 Zio showed PVCs back up to 18.2%.  She did not want to start mexiletine. Echo in 2/25 showed stable EF 45-50%, so LV function does not seem to be worsening with the presence of frequent PVCs. ECG today actually showed no PVCs, but has frequent PACs.  - Will repeat 2 week Zio to quantify burden. - Increase Toprol  XL to 100 mg bid.  4. Hyperlipidemia: Unable to tolerate statins.  On Zetia  and Repatha . Goal LDL < 55.    - Most recent LDL 156 (Off Repatha ). Lipid Clinic following 5. Atrial fibrillation: Transient post-CABG.  Now off amiodarone . No recurrence has been seen.  - If recurs, will need anticoagulation.  - Repeating Zio patch as above 6. ? Hyperthyroidism: Most recently, TSH and free T4 normal. She has a thyroid  nodule.   - PCP managing.  7. Generalized anxiety  8. Osteoarthritis: s/p  total hip arthroplasy. - She is on low dose Eliquis  x 1 month post op.  - Continue ASA 81 while  on [facility]. - When she stops Eliquis , she will stop ASA and resume Plavix  75 mg daily. We discussed this today.  Follow up with Dr. Rolan in 3-4 months with echo.   Harlene HERO [facility] FNP-BC 03/09/2024

## 2024-03-08 NOTE — Telephone Encounter (Signed)
 Called to confirm/remind patient of their appointment at the [facility] on 03/09/24.   Appointment:   [x] Confirmed  [] Left mess   [] No answer/No voice mail  [] VM Full/unable to leave message  [] Phone not in service  Patient reminded to bring all medications and/or complete list.  Confirmed patient has transportation. Gave directions, instructed to utilize valet parking.

## 2024-03-09 ENCOUNTER — Ambulatory Visit (HOSPITAL_COMMUNITY): Payer: Self-pay | Admitting: Family Medicine

## 2024-03-09 ENCOUNTER — Other Ambulatory Visit (HOSPITAL_COMMUNITY): Payer: Self-pay | Admitting: Family Medicine

## 2024-03-09 ENCOUNTER — Encounter (HOSPITAL_COMMUNITY): Payer: Self-pay

## 2024-03-09 ENCOUNTER — Ambulatory Visit (HOSPITAL_COMMUNITY): Admission: RE | Admit: 2024-03-09 | Discharge: 2024-03-09 | Disposition: A | Source: Ambulatory Visit

## 2024-03-09 ENCOUNTER — Ambulatory Visit (HOSPITAL_COMMUNITY): Admit: 2024-03-09 | Discharge: 2024-03-09 | Disposition: A | Attending: Family Medicine

## 2024-03-09 VITALS — BP 124/78 | HR 74 | Ht 68.0 in | Wt 179.6 lb

## 2024-03-09 DIAGNOSIS — I491 Atrial premature depolarization: Secondary | ICD-10-CM | POA: Diagnosis not present

## 2024-03-09 DIAGNOSIS — I2510 Atherosclerotic heart disease of native coronary artery without angina pectoris: Secondary | ICD-10-CM | POA: Diagnosis not present

## 2024-03-09 DIAGNOSIS — I5022 Chronic systolic (congestive) heart failure: Secondary | ICD-10-CM | POA: Diagnosis not present

## 2024-03-09 DIAGNOSIS — I493 Ventricular premature depolarization: Secondary | ICD-10-CM | POA: Diagnosis not present

## 2024-03-09 DIAGNOSIS — E0590 Thyrotoxicosis, unspecified without thyrotoxic crisis or storm: Secondary | ICD-10-CM

## 2024-03-09 DIAGNOSIS — E785 Hyperlipidemia, unspecified: Secondary | ICD-10-CM

## 2024-03-09 LAB — BASIC METABOLIC PANEL WITH GFR
Anion gap: 6 (ref 5–15)
BUN: 17 mg/dL (ref 8–23)
CO2: 28 mmol/L (ref 22–32)
Calcium: 9.2 mg/dL (ref 8.9–10.3)
Chloride: 105 mmol/L (ref 98–111)
Creatinine, Ser: 1 mg/dL (ref 0.44–1.00)
GFR, Estimated: 60 mL/min — ABNORMAL LOW (ref >60)
Glucose, Bld: 95 mg/dL (ref 70–99)
Potassium: 4.7 mmol/L (ref 3.5–5.1)
Sodium: 139 mmol/L (ref 135–145)

## 2024-03-09 NOTE — Patient Instructions (Addendum)
 Good to see you today!   When you restart plavix  75 mg daily on 03/18/2024 you  can stop aspirin   Your provider has recommended that  you wear a Zio Patch for 14 days.  This monitor will record your heart rhythm for our review.  IF you have any symptoms while wearing the monitor please press the button.  If you have any issues with the patch or you notice a red or orange light on it please call the company at [phone].  Once you remove the patch please mail it back to the company as soon as possible so we can get the results.  Labs done today, your results will be available in MyChart, we will contact you for abnormal readings.  Your physician recommends that you schedule a follow-up appointment  4 months with echocardiogram  If you have any questions or concerns before your next appointment please send us  a message through [place] or call our office at [phone].    TO LEAVE A MESSAGE FOR THE NURSE SELECT OPTION 2, PLEASE LEAVE A MESSAGE INCLUDING: YOUR NAME DATE OF BIRTH CALL BACK NUMBER REASON FOR CALL**this is important as we prioritize the call backs  YOU WILL RECEIVE A CALL BACK THE SAME DAY AS LONG AS YOU CALL BEFORE 4:00 PM At the [facility], you and your health needs are our priority. As part of our continuing mission to provide you with exceptional heart care, we have created designated Provider Care Teams. These Care Teams include your primary Cardiologist (physician) and Advanced Practice Providers (APPs- Physician Assistants and Nurse Practitioners) who all work together to provide you with the care you need, when you need it.   You may see any of the following providers on your designated Care Team at your next follow up: Dr Toribio Fuel Dr Ezra Shuck Dr. Morene Brownie Greig Mosses, NP Caffie Shed, [place] [facility] [place], [place] Beckey Coe, NP Jordan Lee, NP Ellouise Class, NP Tinnie Redman, PharmD Jaun Bash,  PharmD   Please be sure to bring in all your medications bottles to every appointment.    Thank you for choosing [facility]

## 2024-03-09 NOTE — Telephone Encounter (Signed)
 ERROR

## 2024-03-24 ENCOUNTER — Encounter: Payer: Self-pay | Admitting: Family Medicine

## 2024-03-25 ENCOUNTER — Other Ambulatory Visit: Payer: Self-pay | Admitting: Family Medicine

## 2024-03-25 DIAGNOSIS — F419 Anxiety disorder, unspecified: Secondary | ICD-10-CM

## 2024-03-25 MED ORDER — CLONAZEPAM 1 MG PO TABS: 1.0000 mg | ORAL_TABLET | Freq: Two times a day (BID) | ORAL | 0 refills | Status: AC | PRN

## 2024-03-27 ENCOUNTER — Other Ambulatory Visit: Payer: Self-pay | Admitting: Family Medicine

## 2024-03-27 DIAGNOSIS — E876 Hypokalemia: Secondary | ICD-10-CM

## 2024-03-28 ENCOUNTER — Encounter: Attending: Registered Nurse | Admitting: Registered Nurse

## 2024-03-28 DIAGNOSIS — Z5181 Encounter for therapeutic drug level monitoring: Secondary | ICD-10-CM | POA: Insufficient documentation

## 2024-03-28 DIAGNOSIS — M5412 Radiculopathy, cervical region: Secondary | ICD-10-CM | POA: Diagnosis not present

## 2024-03-28 DIAGNOSIS — M542 Cervicalgia: Secondary | ICD-10-CM | POA: Insufficient documentation

## 2024-03-28 DIAGNOSIS — G894 Chronic pain syndrome: Secondary | ICD-10-CM | POA: Insufficient documentation

## 2024-03-28 DIAGNOSIS — G8918 Other acute postprocedural pain: Secondary | ICD-10-CM | POA: Diagnosis not present

## 2024-03-28 DIAGNOSIS — G8929 Other chronic pain: Secondary | ICD-10-CM | POA: Insufficient documentation

## 2024-03-28 DIAGNOSIS — Z79891 Long term (current) use of opiate analgesic: Secondary | ICD-10-CM | POA: Diagnosis not present

## 2024-03-28 DIAGNOSIS — M545 Low back pain, unspecified: Secondary | ICD-10-CM | POA: Diagnosis not present

## 2024-03-28 DIAGNOSIS — G5793 Unspecified mononeuropathy of bilateral lower limbs: Secondary | ICD-10-CM | POA: Insufficient documentation

## 2024-03-28 MED ORDER — HYDROCODONE-ACETAMINOPHEN 10-325 MG PO TABS: 1.0000 | ORAL_TABLET | Freq: Four times a day (QID) | ORAL | 0 refills | Status: DC | PRN

## 2024-03-28 MED ORDER — HYDROCODONE-ACETAMINOPHEN 10-325 MG PO TABS: 1.0000 | ORAL_TABLET | Freq: Four times a day (QID) | ORAL | 0 refills | Status: AC | PRN

## 2024-03-28 NOTE — Progress Notes (Signed)
 "  Subjective:    Patient ID: [name], female    DOB: 1950/06/18, 73 y.o.   MRN: [id number]  HPI: [name] is a 73 y.o. female who is scheduled for virtual visit, unable to access virtual, changed to telephone visit. [name] is not driving at this time due to recent surgery, she underwent on 02/17/2024 : Dr. Edna ARTHROPLASTY, HIP, TOTAL,POSTERIOR APPROACH;  I connected with Nathanel PARAS Deroy by telephone and verified that I am speaking with the correct person using two identifiers.  Location: Patient: In her Home Provider: In the office    I discussed the limitations, risks, security and privacy concerns of performing an evaluation and management service by telephone and the availability of in person appointments. I also discussed with the patient that there may be a patient responsible charge related to this service. The patient expressed understanding and agreed to proceed.   She  states her  pain is located in her neck radiating into her left shoulder, lower back  and left hip pain . Also reports bilateral feet with tingling and burning, She rates her pain 7-8. Her current exercise regime is physical thrapy two days a week.  [name] Morphine  equivalent is 40.00  MME. She is also prescribed Clonazepam  by Dr. Jordan .We have discussed the black box warning of using opioids and benzodiazepines. I highlighted the dangers of using these drugs together and discussed the adverse events including respiratory suppression, overdose, cognitive impairment and importance of compliance with current regimen. We will continue to monitor and adjust as indicated.    Pain Inventory Average Pain 7-8 Pain Right Now 7-8 My pain is constant, sharp, burning, dull, stabbing, tingling, and aching  In the last 24 hours, has pain interfered with the following? General activity 5-7 Relation with others 5-7 Enjoyment of life 5-7 What TIME of day is your pain at its worst? morning ,  daytime, evening, and night Sleep (in general) Poor  Pain is worse with: walking, bending, sitting, standing, and some activites Pain improves with: rest and medication Relief from Meds: 10  Family History  Problem Relation Age of Onset   Diabetes Mother    Alzheimer's disease Mother    Heart disease Mother    Lung cancer Father    Esophageal cancer Brother    Colon cancer Neg Hx    Social History   Socioeconomic History   Marital status: Widowed    Spouse name: Not on file   Number of children: 1   Years of education: Not on file   Highest education level: Some college, no degree  Occupational History   Occupation: retired    Comment: work part time  Tobacco Use   Smoking status: Never   Smokeless tobacco: Never   Tobacco comments:    Never smoke 11/01/21  Vaping Use   Vaping status: Never Used  Substance and Sexual Activity   Alcohol  use: Never   Drug use: No   Sexual activity: Not on file  Other Topics Concern   Not on file  Social History Narrative   Not on file   Social Drivers of Health   Tobacco Use: Low Risk (03/09/2024)   Patient History    Smoking Tobacco Use: Never    Smokeless Tobacco Use: Never    Passive Exposure: Not on file  Financial Resource Strain: Low Risk (01/15/2024)   Overall Financial Resource Strain (CARDIA)    Difficulty of Paying Living Expenses: Not hard at all  Food Insecurity: No Food Insecurity (02/17/2024)   Epic    Worried About Programme Researcher, Broadcasting/film/video in the Last Year: Never true    Ran Out of Food in the Last Year: Never true  Transportation Needs: No Transportation Needs (02/17/2024)   Epic    Lack of Transportation (Medical): No    Lack of Transportation (Non-Medical): No  Physical Activity: Inactive (01/15/2024)   Exercise Vital Sign    Days of Exercise per Week: 0 days    Minutes of Exercise per Session: 0 min  Stress: No Stress Concern Present (01/15/2024)   [employer] of Occupational Health - Occupational  Stress Questionnaire    Feeling of Stress: Not at all  Social Connections: Moderately Integrated (02/17/2024)   Social Connection and Isolation Panel    Frequency of Communication with Friends and Family: More than three times a week    Frequency of Social Gatherings with Friends and Family: More than three times a week    Attends Religious Services: More than 4 times per year    Active Member of [employer] or Organizations: Yes    Attends Banker Meetings: More than 4 times per year    Marital Status: Widowed  Depression (PHQ2-9): Low Risk (01/15/2024)   Depression (PHQ2-9)    PHQ-2 Score: 0  Alcohol  Screen: Low Risk (01/15/2024)   Alcohol  Screen    Last Alcohol  Screening Score (AUDIT): 0  Housing: Low Risk (02/17/2024)   Epic    Unable to Pay for Housing in the Last Year: No    Number of Times Moved in the Last Year: 0    Homeless in the Last Year: No  Utilities: Not At Risk (02/17/2024)   Epic    Threatened with loss of utilities: No  Health Literacy: Adequate Health Literacy (01/15/2024)   B1300 Health Literacy    Frequency of need for help with medical instructions: Never   Past Surgical History:  Procedure Laterality Date   ABDOMINAL HYSTERECTOMY     partial   ACHILLES TENDON SURGERY Left    APPENDECTOMY     BACK SURGERY     BLADDER SUSPENSION     BREAST SURGERY Right 2003   milk duct removed   CARDIAC CATHETERIZATION     CHOLECYSTECTOMY     CORONARY ARTERY BYPASS GRAFT N/A 11/14/2021   Procedure: CORONARY ARTERY BYPASS GRAFTING (CABG) TIMES THREE USING LIMA AND CRYO VEIN.;  Surgeon: Obadiah Coy, MD;  Location: [facility] OR;  Service: Open Heart Surgery;  Laterality: N/A;   FRACTURE SURGERY     collar bone   LEFT HEART CATH AND CORONARY ANGIOGRAPHY N/A 03/08/2021   Procedure: LEFT HEART CATH AND CORONARY ANGIOGRAPHY;  Surgeon: Jordan, Peter M, MD;  Location: [facility] INVASIVE CV LAB;  Service: Cardiovascular;  Laterality: N/A;   NOSE SURGERY     x 2    OTHER  SURGICAL HISTORY     Breast Duct removed   PARTIAL HYSTERECTOMY     RECTOCELE REPAIR     TEE WITHOUT CARDIOVERSION N/A 11/14/2021   Procedure: TRANSESOPHAGEAL ECHOCARDIOGRAM (TEE);  Surgeon: Obadiah Coy, MD;  Location: [facility] OR;  Service: Open Heart Surgery;  Laterality: N/A;   TONSILLECTOMY     TOTAL HIP ARTHROPLASTY Left 02/17/2024   Procedure: ARTHROPLASTY, HIP, TOTAL,POSTERIOR APPROACH;  Surgeon: Edna Toribio LABOR, MD;  Location: [facility] ORS;  Service: Orthopedics;  Laterality: Left;   VASCULAR SURGERY Right    right leg   VEIN SURGERY Right    leg  Past Surgical History:  Procedure Laterality Date   ABDOMINAL HYSTERECTOMY     partial   ACHILLES TENDON SURGERY Left    APPENDECTOMY     BACK SURGERY     BLADDER SUSPENSION     BREAST SURGERY Right 2003   milk duct removed   CARDIAC CATHETERIZATION     CHOLECYSTECTOMY     CORONARY ARTERY BYPASS GRAFT N/A 11/14/2021   Procedure: CORONARY ARTERY BYPASS GRAFTING (CABG) TIMES THREE USING LIMA AND CRYO VEIN.;  Surgeon: Obadiah Coy, MD;  Location: [facility] OR;  Service: Open Heart Surgery;  Laterality: N/A;   FRACTURE SURGERY     collar bone   LEFT HEART CATH AND CORONARY ANGIOGRAPHY N/A 03/08/2021   Procedure: LEFT HEART CATH AND CORONARY ANGIOGRAPHY;  Surgeon: Jordan, Peter M, MD;  Location: [facility] INVASIVE CV LAB;  Service: Cardiovascular;  Laterality: N/A;   NOSE SURGERY     x 2    OTHER SURGICAL HISTORY     Breast Duct removed   PARTIAL HYSTERECTOMY     RECTOCELE REPAIR     TEE WITHOUT CARDIOVERSION N/A 11/14/2021   Procedure: TRANSESOPHAGEAL ECHOCARDIOGRAM (TEE);  Surgeon: Obadiah Coy, MD;  Location: [facility] OR;  Service: Open Heart Surgery;  Laterality: N/A;   TONSILLECTOMY     TOTAL HIP ARTHROPLASTY Left 02/17/2024   Procedure: ARTHROPLASTY, HIP, TOTAL,POSTERIOR APPROACH;  Surgeon: Edna Toribio LABOR, MD;  Location: [facility] ORS;  Service: Orthopedics;  Laterality: Left;   VASCULAR SURGERY Right    right leg   VEIN SURGERY Right     leg   Past Medical History:  Diagnosis Date   Adrenal gland cyst    Anxiety    Arthritis    CHF (congestive heart failure) (HCC)    Depression    Diabetes mellitus without complication (HCC)    GERD (gastroesophageal reflux disease)    Heart murmur    History of kidney stones    Hyperlipidemia    Hypertension    Neuromuscular disorder (HCC)    Palpitations    Pneumonia    PONV (postoperative nausea and vomiting)    PTSD (post-traumatic stress disorder)    Ringing of ears, left    There were no vitals taken for this visit.  Opioid Risk Score:   Fall Risk Score:  `1  Depression screen PHQ 2/9     01/15/2024    4:00 PM 12/29/2023    2:13 PM 10/27/2023    2:08 PM 08/25/2023    2:08 PM 06/25/2023    2:00 PM 04/28/2023    2:01 PM 04/28/2023    1:49 PM  Depression screen PHQ 2/9  Decreased Interest 0 1 1 0 0 0 0  Down, Depressed, Hopeless 0 1 1 0 0 0 0  PHQ - 2 Score 0 2 2 0 0 0 0  Altered sleeping    0     Tired, decreased energy    0     Change in appetite    0     Feeling bad or failure about yourself     0     Trouble concentrating    0     Moving slowly or fidgety/restless    0     Suicidal thoughts    0     PHQ-9 Score    0      Difficult doing work/chores    Not difficult at all        Data saved with a previous flowsheet row definition  Review of Systems  Musculoskeletal:  Positive for back pain and neck pain.       Neck pain, left shoulder pain, low back pain, left hip pain  All other systems reviewed and are negative.      Objective:   Physical Exam Vitals and nursing note reviewed.  Musculoskeletal:     Comments: No Physical Exam Performed : Virtual Visit           Assessment & Plan:  1.Chronic cervicalgia with documented spondylosis on MRI, per Dr. Babs Note and Cervical Radiculitis: Continue Gabapentin ,  Continue HEP as Tolerated. 03/28/2024 2. Left shoulder pain most consistent with left rotator cuff syndrome. Mild DJD.  Continue HEP  as tolerated.Continue to monitor. 12/22/20245. Refilled:  Hydrocodone  10/325 mg one tablet every 6 hours as needed for pain. #120. Second script sent for the following month. We will continue the opioid monitoring program, this consists of regular clinic visits, examinations, urine drug screen, pill counts as well as use of [place]  Controlled Substance Reporting system. A 12 month History has been reviewed on the [place]  Controlled Substance Reporting System on 12/29/2023. 3. Left wrist/finger pain most consistent with OA, ?post traumatic. No complaints today. 03/28/2024 4. Anxiety: Continue Klonopin , PCP prescribing. Continue to monitor. 03/28/2024 5. Restless Leg syndrome: Continue Gabapentin . 03/28/2024 7. Sacroiliac inflammation: S/P Right  SI injection on 09/20/2019, with No relief noted. [name] seen Dr Addie in regards to her pain. Dr Onetha. Following.  Continue to Monitor. 03/28/2024 8. Myofascial Pain: Continue Robaxin  as needed. 03/28/2024.  9. Bilateral feet with neuropathic pain: Increase Gabapentin  600 mg 4 times a day. Continue to monitor. 03/28/2024. 10.  Herniated Nucleus Pulposus L2-3 with recurrent disc rupture: S/P  Posterior Lumbar Interbody Fusion  - Lumbar two-Lumbar three on 01/13/2020 by Dr Onetha.  Dr Onetha Following. 03/28/2024 11. Left Ankle Pain:No complaints today.  Ortho Following. . Continue to monitor. 03/28/2024  12. Polyarthralgia: Continue HEP as Tolerated. Continue to monitor. 03/28/2024 13. Diabetic Peripheral Neuropathy. Continue Gabapentin . Continue to Monitor. Continue to Monitor.  03/28/2024 14, Fall: subsequent encounter: No Falls this month. Educated in [place] Prevention: She verbalizes understanding. 03/28/2024 15. Chronic Right Shoulder Pain: Ortho Following. 03/28/2024 16. Chronic Low Back Pain: Neurosurgery following.  Continue to Monitor. 03/28/2024 17. Left Hip Pain: Post Op Pain : S/P on 02/17/2024:   Dr. Edna ARTHROPLASTY, HIP,  TOTAL,POSTERIOR APPROACH; F/U in 2 months   Telephone Visit Established Patient Location of Patient: In her Home  Location of provider: In the Office Total Time Spent: 10 Minutes     "

## 2024-04-01 ENCOUNTER — Encounter: Payer: Self-pay | Admitting: Registered Nurse

## 2024-04-04 NOTE — Addendum Note (Signed)
 Encounter addended by: Debarah Garrison MATSU, RN on: 04/04/2024 9:55 AM  Actions taken: Imaging Exam ended

## 2024-04-05 ENCOUNTER — Other Ambulatory Visit (HOSPITAL_COMMUNITY): Payer: Self-pay | Admitting: Cardiology

## 2024-04-05 ENCOUNTER — Telehealth (HOSPITAL_COMMUNITY): Payer: Self-pay

## 2024-04-05 ENCOUNTER — Encounter (HOSPITAL_COMMUNITY): Payer: Self-pay | Admitting: Cardiology

## 2024-04-05 DIAGNOSIS — E1149 Type 2 diabetes mellitus with other diabetic neurological complication: Secondary | ICD-10-CM

## 2024-04-05 DIAGNOSIS — I5022 Chronic systolic (congestive) heart failure: Secondary | ICD-10-CM

## 2024-04-05 NOTE — Telephone Encounter (Signed)
 No pre cert required for sleep study 9[address]00

## 2024-04-11 ENCOUNTER — Ambulatory Visit (HOSPITAL_COMMUNITY): Payer: Self-pay | Admitting: Family Medicine

## 2024-04-15 ENCOUNTER — Other Ambulatory Visit (HOSPITAL_COMMUNITY): Payer: Self-pay

## 2024-04-15 ENCOUNTER — Encounter (HOSPITAL_COMMUNITY): Payer: Self-pay | Admitting: Cardiology

## 2024-04-18 ENCOUNTER — Telehealth (HOSPITAL_COMMUNITY): Payer: Self-pay

## 2024-04-18 ENCOUNTER — Encounter: Payer: Self-pay | Admitting: Family Medicine

## 2024-04-18 NOTE — Telephone Encounter (Signed)
 Advanced Heart Failure Patient Advocate Encounter  The patient was approved for a [employer] grant that will help cover the cost of  Eliquis , Entresto , Jardiance , Metoprolol .  Total amount awarded, $7,500.  Effective: 03/19/2024 - 03/18/2025.  BIN [id number] PCN [id number] [id number] ID [id number]  Pharmacy provided with approval and processing information. Confirmed $0 copay for Entresto . Patient informed via MyChart.  Rachel DEL, CPhT Rx Patient Advocate Phone: [phone]

## 2024-04-22 ENCOUNTER — Other Ambulatory Visit: Payer: Self-pay | Admitting: Family Medicine

## 2024-04-22 MED ORDER — ONDANSETRON 4 MG PO TBDP: 4.0000 mg | ORAL_TABLET | Freq: Every day | ORAL | 0 refills | Status: AC | PRN

## 2024-05-05 ENCOUNTER — Encounter (HOSPITAL_COMMUNITY): Payer: Self-pay | Admitting: Cardiology

## 2024-05-13 ENCOUNTER — Other Ambulatory Visit (HOSPITAL_COMMUNITY): Payer: Self-pay | Admitting: Cardiology

## 2024-05-25 ENCOUNTER — Encounter: Admitting: Registered Nurse

## 2024-08-02 ENCOUNTER — Ambulatory Visit (HOSPITAL_COMMUNITY): Admitting: Cardiology

## 2024-08-02 ENCOUNTER — Other Ambulatory Visit (HOSPITAL_COMMUNITY)

## 2025-01-20 ENCOUNTER — Ambulatory Visit
